# Patient Record
Sex: Male | Born: 1950
Health system: Southern US, Community
[De-identification: ages and names within clinical notes are randomized; demographics above are authoritative.]

## PROBLEM LIST (undated history)

## (undated) DIAGNOSIS — M1990 Unspecified osteoarthritis, unspecified site: Secondary | ICD-10-CM

## (undated) DIAGNOSIS — E785 Hyperlipidemia, unspecified: Secondary | ICD-10-CM

## (undated) DIAGNOSIS — G4700 Insomnia, unspecified: Secondary | ICD-10-CM

## (undated) DIAGNOSIS — R51 Headache: Secondary | ICD-10-CM

## (undated) DIAGNOSIS — K210 Gastro-esophageal reflux disease with esophagitis: Secondary | ICD-10-CM

## (undated) DIAGNOSIS — M2550 Pain in unspecified joint: Secondary | ICD-10-CM

## (undated) DIAGNOSIS — R112 Nausea with vomiting, unspecified: Secondary | ICD-10-CM

## (undated) DIAGNOSIS — IMO0001 Reserved for inherently not codable concepts without codable children: Secondary | ICD-10-CM

## (undated) DIAGNOSIS — R351 Nocturia: Secondary | ICD-10-CM

## (undated) DIAGNOSIS — K257 Chronic gastric ulcer without hemorrhage or perforation: Secondary | ICD-10-CM

## (undated) DIAGNOSIS — R519 Headache, unspecified: Secondary | ICD-10-CM

## (undated) DIAGNOSIS — R200 Anesthesia of skin: Secondary | ICD-10-CM

## (undated) DIAGNOSIS — G8929 Other chronic pain: Secondary | ICD-10-CM

## (undated) DIAGNOSIS — I10 Essential (primary) hypertension: Secondary | ICD-10-CM

## (undated) DIAGNOSIS — K922 Gastrointestinal hemorrhage, unspecified: Secondary | ICD-10-CM

## (undated) DIAGNOSIS — R3915 Urgency of urination: Secondary | ICD-10-CM

## (undated) DIAGNOSIS — Z8739 Personal history of other diseases of the musculoskeletal system and connective tissue: Secondary | ICD-10-CM

## (undated) DIAGNOSIS — Z9289 Personal history of other medical treatment: Secondary | ICD-10-CM

## (undated) DIAGNOSIS — Z9889 Other specified postprocedural states: Secondary | ICD-10-CM

## (undated) DIAGNOSIS — B1920 Unspecified viral hepatitis C without hepatic coma: Secondary | ICD-10-CM

## (undated) DIAGNOSIS — H409 Unspecified glaucoma: Secondary | ICD-10-CM

## (undated) DIAGNOSIS — R350 Frequency of micturition: Secondary | ICD-10-CM

## (undated) DIAGNOSIS — M549 Dorsalgia, unspecified: Secondary | ICD-10-CM

## (undated) DIAGNOSIS — K559 Vascular disorder of intestine, unspecified: Secondary | ICD-10-CM

## (undated) DIAGNOSIS — K449 Diaphragmatic hernia without obstruction or gangrene: Secondary | ICD-10-CM

## (undated) DIAGNOSIS — K219 Gastro-esophageal reflux disease without esophagitis: Secondary | ICD-10-CM

## (undated) DIAGNOSIS — I639 Cerebral infarction, unspecified: Secondary | ICD-10-CM

## (undated) DIAGNOSIS — C61 Malignant neoplasm of prostate: Secondary | ICD-10-CM

## (undated) HISTORY — PX: ESOPHAGOGASTRODUODENOSCOPY: SHX1529

## (undated) HISTORY — DX: Unspecified viral hepatitis C without hepatic coma: B19.20

## (undated) HISTORY — PX: PROSTATECTOMY: SHX69

## (undated) HISTORY — PX: SMALL INTESTINE SURGERY: SHX150

## (undated) HISTORY — PX: COLONOSCOPY: SHX174

## (undated) HISTORY — DX: Malignant neoplasm of prostate: C61

## (undated) HISTORY — PX: APPENDECTOMY: SHX54

## (undated) HISTORY — DX: Cerebral infarction, unspecified: I63.9

## (undated) HISTORY — PX: OTHER SURGICAL HISTORY: SHX169

## (undated) HISTORY — DX: Hyperlipidemia, unspecified: E78.5

## (undated) NOTE — *Deleted (*Deleted)
[facility] CONSULT NOTE  Patient Care Team: Deeann Saint, MD as PCP - General (Family Medicine)  CHIEF COMPLAINTS/PURPOSE OF CONSULTATION:  Newly diagnosed breast cancer  HISTORY OF PRESENTING ILLNESS:  [name] 5 y.o. male is here because of recent diagnosis of iron deficiency anemia. He presented to the ED on 03/19/20 for dizziness and hypotension, and was found to have Hg of 6. He was given 2 units of PRBCs, and received Feraheme, as labs showed iron saturation 13%, ferritin 2. He presents to the clinic today for initial evaluation.   I reviewed his records extensively and collaborated the history with the patient.  MEDICAL HISTORY:  Past Medical History:  Diagnosis Date  . Arthritis   . Cameron ulcer, chronic 01/11/2018  . Chronic back pain   . GERD (gastroesophageal reflux disease)    uses Baking Soda  . GIB (gastrointestinal bleeding) 2012  . Glaucoma    right eye  . Headache    occasionally  . Hepatitis C   . Hiatal hernia with gastroesophageal reflux disease and esophagitis 01/05/2018  . History of blood transfusion    no abnormal reaction noted  . History of gout    doesn't take any meds  . Hyperlipidemia    not on any meds  . Hypertension    takes Amlodipine and Lisinopril daily  . Insomnia    takes Ambien nightly  . Ischemic colitis (HCC) 2012  . Joint pain   . Nocturia   . Numbness    both legs occasionally  . PONV (postoperative nausea and vomiting)   . Prostate cancer (HCC)   . Shortness of breath dyspnea    rarely but when notices he can be lying/sitting/exertion.Dr.Hochrein is aware per pt  . Stroke (HCC) 2016   . Urinary frequency   . Urinary urgency     SURGICAL HISTORY: Past Surgical History:  Procedure Laterality Date  . APPENDECTOMY    . BIOPSY  01/11/2018   Procedure: BIOPSY;  Surgeon: Iva Boop, MD;  Location: [facility] ENDOSCOPY;  Service: Endoscopy;;  . COLONOSCOPY    . COLONOSCOPY N/A 10/26/2015    Procedure: COLONOSCOPY;  Surgeon: Sherrilyn Rist, MD;  Location[facility];  Service: Endoscopy;  Laterality: N/A;  . ESOPHAGOGASTRODUODENOSCOPY    . ESOPHAGOGASTRODUODENOSCOPY N/A 10/26/2015   Procedure: ESOPHAGOGASTRODUODENOSCOPY (EGD);  Surgeon: Sherrilyn Rist, MD;  Location: [facility] ENDOSCOPY;  Service: Endoscopy;  Laterality: N/A;  . ESOPHAGOGASTRODUODENOSCOPY (EGD) WITH PROPOFOL N/A 01/11/2018   Procedure: ESOPHAGOGASTRODUODENOSCOPY (EGD) WITH PROPOFOL;  Surgeon: Iva Boop, MD;  Location: [facility] ENDOSCOPY;  Service: Endoscopy;  Laterality: N/A;  . INGUINAL HERNIA REPAIR Right 11/04/2014   Procedure: RIGHT INGUINAL HERNIA REPAIR WITH MESH;  Surgeon: Avel Peace, MD;  Location: [facility] OR;  Service: General;  Laterality: Right;  . MASS EXCISION N/A 11/04/2014   Procedure: REMOVAL OF RIGHT GROIN SOFT TISSUE MASS;  Surgeon: Avel Peace, MD;  Location: [facility] OR;  Service: General;  Laterality: N/A;  . Multiple abdominal surgeries     total of 13  . PROSTATECTOMY    . SMALL INTESTINE SURGERY      SOCIAL HISTORY: Social History   Socioeconomic History  . Marital status: Legally Separated    Spouse name: Not on file  . Number of children: 2  . Years of education: Not on file  . Highest education level: Not on file  Occupational History  . Occupation: retired  Tobacco Use  . Smoking status: Current Every  Day Smoker    Packs/day: 0.50    Years: 50.00    Pack years: 25.00    Types: Cigarettes  . Smokeless tobacco: Never Used  Vaping Use  . Vaping Use: Former  Substance and Sexual Activity  . Alcohol use: Yes    Alcohol/week: 0.0 standard drinks    Comment: occasionally  . Drug use: Yes    Frequency: 5.0 times per week    Types: Marijuana    Comment: "as often as I can get it"  . Sexual activity: Yes  Other Topics Concern  . Not on file  Social History Narrative   One living child .  One murdered.  Lives with girlfriend.     Social Determinants of Health   Financial  Resource Strain:   . Difficulty of Paying Living Expenses: Not on file  Food Insecurity:   . Worried About Programme researcher, broadcasting/film/video in the Last Year: Not on file  . Ran Out of Food in the Last Year: Not on file  Transportation Needs:   . Lack of Transportation (Medical): Not on file  . Lack of Transportation (Non-Medical): Not on file  Physical Activity:   . Days of Exercise per Week: Not on file  . Minutes of Exercise per Session: Not on file  Stress:   . Feeling of Stress : Not on file  Social Connections:   . Frequency of Communication with Friends and Family: Not on file  . Frequency of Social Gatherings with Friends and Family: Not on file  . Attends Religious Services: Not on file  . Active Member of Clubs or Organizations: Not on file  . Attends Banker Meetings: Not on file  . Marital Status: Not on file  Intimate Partner Violence:   . Fear of Current or Ex-Partner: Not on file  . Emotionally Abused: Not on file  . Physically Abused: Not on file  . Sexually Abused: Not on file    FAMILY HISTORY: Family History  Problem Relation Age of Onset  . Alzheimer's disease Father   . Cancer Mother        Lymph node  . Heart failure Brother 45       Transplant 13 years ago  . CAD Brother   . Heart disease Sister 87       MI    ALLERGIES:  is allergic to penicillins and sudafed [pseudoephedrine].  MEDICATIONS:  Current Outpatient Medications  Medication Sig Dispense Refill  . acetaminophen (TYLENOL) 325 MG tablet Take 2 tablets (650 mg total) by mouth 4 (four) times daily -  with meals and at bedtime. (Patient taking differently: Take 650 mg by mouth every 6 (six) hours as needed for mild pain. )    . ALPRAZolam (XANAX) 0.25 MG tablet Take 1 tablet (0.25 mg total) by mouth daily as needed for anxiety. 30 tablet 0  . Blood Pressure Monitoring (BLOOD PRESSURE MONITOR/L CUFF) MISC Use as directed daily. 1 each 0  . buPROPion (WELLBUTRIN SR) 200 MG 12 hr tablet TAKE 1  TABLET BY MOUTH DAILY 30 tablet 2  . cyclobenzaprine (FLEXERIL) 5 MG tablet TAKE 1 TABLET BY MOUTH TWICE A DAY AS NEEDED FOR MUSCLE SPASMS 60 tablet 2  . ferrous sulfate 325 (65 FE) MG tablet TAKE 1 TABLET BY MOUTH THREE TWICE A DAY WITH MEALS (Patient taking differently: Take 325 mg by mouth 3 (three) times daily with meals. ) 90 tablet 3  . hydrALAZINE (APRESOLINE) 25 MG tablet TAKE 1  TABLET BY MOUTH THREE TIMES A DAY (Patient taking differently: Take 25 mg by mouth 3 (three) times daily. ) 90 tablet 2  . pantoprazole (PROTONIX) 40 MG tablet TAKE 1 TABLET BY MOUTH TWICE A DAY BEFORE A MEAL (Patient taking differently: Take 40 mg by mouth 2 (two) times daily. ) 60 tablet 0  . QUEtiapine (SEROQUEL) 200 MG tablet TAKE 1 TABLET BY MOUTH EVERY NIGHT AT BEDTIME 30 tablet 2  . senna-docusate (SENOKOT-S) 8.6-50 MG tablet Take 1 tablet by mouth 2 (two) times daily. 30 tablet 0  . sodium chloride (OCEAN) 0.65 % SOLN nasal spray Place 1 spray into both nostrils as needed for congestion.  0  . traMADol (ULTRAM) 50 MG tablet Take 1 tablet (50 mg total) by mouth every 12 (twelve) hours as needed for severe pain (shouder pain). 60 tablet 0   No current facility-administered medications for this visit.    REVIEW OF SYSTEMS:   Constitutional: Denies fevers, chills or abnormal night sweats Eyes: Denies blurriness of vision, double vision or watery eyes Ears, nose, mouth, throat, and face: Denies mucositis or sore throat Respiratory: Denies cough, dyspnea or wheezes Cardiovascular: Denies palpitation, chest discomfort or lower extremity swelling Gastrointestinal:  Denies nausea, heartburn or change in bowel habits Skin: Denies abnormal skin rashes Lymphatics: Denies new lymphadenopathy or easy bruising Neurological:Denies numbness, tingling or new weaknesses Behavioral/Psych: Mood is stable, no new changes  All other systems were reviewed with the patient and are negative.  PHYSICAL EXAMINATION: ECOG  PERFORMANCE STATUS: {CHL ONC ECOG PS:[id number]}  There were no vitals filed for this visit. There were no vitals filed for this visit.  GENERAL:alert, no distress and comfortable SKIN: skin color, texture, turgor are normal, no rashes or significant lesions EYES: normal, conjunctiva are pink and non-injected, sclera clear OROPHARYNX:no exudate, no erythema and lips, buccal mucosa, and tongue normal  NECK: supple, thyroid normal size, non-tender, without nodularity LYMPH:  no palpable lymphadenopathy in the cervical, axillary or inguinal LUNGS: clear to auscultation and percussion with normal breathing effort HEART: regular rate & rhythm and no murmurs and no lower extremity edema ABDOMEN:abdomen soft, non-tender and normal bowel sounds Musculoskeletal:no cyanosis of digits and no clubbing  PSYCH: alert & oriented x 3 with fluent speech NEURO: no focal motor/sensory deficits  LABORATORY DATA:  I have reviewed the data as listed Lab Results  Component Value Date   WBC 6.8 03/31/2020   HGB 9.7 (L) 03/31/2020   HCT 32.0 (L) 03/31/2020   MCV 78.6 (L) 03/31/2020   PLT 363 03/31/2020   Lab Results  Component Value Date   NA 138 03/31/2020   K 3.9 03/31/2020   CL 105 03/31/2020   CO2 25 03/31/2020    RADIOGRAPHIC STUDIES: I have personally reviewed the radiological reports and agreed with the findings in the report.  ASSESSMENT AND PLAN:  No problem-specific Assessment & Plan notes found for this encounter.   All questions were answered. The patient knows to call the clinic with any problems, questions or concerns.   Sabas Sous, MD, MPH 04/14/2020    I, Molly Dorshimer, am acting as scribe for Serena Croissant, MD.  {Add scribe attestation statement}

---

## 2000-03-27 ENCOUNTER — Emergency Department (HOSPITAL_COMMUNITY): Admission: EM | Admit: 2000-03-27 | Discharge: 2000-03-27 | Payer: Self-pay | Admitting: Emergency Medicine

## 2000-04-06 ENCOUNTER — Encounter: Payer: Self-pay | Admitting: Emergency Medicine

## 2000-04-06 ENCOUNTER — Encounter (INDEPENDENT_AMBULATORY_CARE_PROVIDER_SITE_OTHER): Payer: Self-pay | Admitting: Specialist

## 2000-04-06 ENCOUNTER — Emergency Department (HOSPITAL_COMMUNITY): Admission: EM | Admit: 2000-04-06 | Discharge: 2000-04-06 | Payer: Self-pay

## 2000-04-06 ENCOUNTER — Inpatient Hospital Stay: Admission: EM | Admit: 2000-04-06 | Discharge: 2000-04-19 | Payer: Self-pay | Admitting: Emergency Medicine

## 2000-04-07 ENCOUNTER — Encounter: Payer: Self-pay | Admitting: Internal Medicine

## 2000-04-11 ENCOUNTER — Encounter: Payer: Self-pay | Admitting: Internal Medicine

## 2000-04-14 ENCOUNTER — Encounter: Payer: Self-pay | Admitting: Internal Medicine

## 2000-04-23 ENCOUNTER — Inpatient Hospital Stay (HOSPITAL_COMMUNITY): Admission: EM | Admit: 2000-04-23 | Discharge: 2000-04-28 | Payer: Self-pay | Admitting: Emergency Medicine

## 2000-04-23 ENCOUNTER — Encounter: Payer: Self-pay | Admitting: Emergency Medicine

## 2000-04-24 ENCOUNTER — Encounter: Payer: Self-pay | Admitting: General Surgery

## 2000-04-25 ENCOUNTER — Encounter: Payer: Self-pay | Admitting: General Surgery

## 2001-01-20 ENCOUNTER — Emergency Department (HOSPITAL_COMMUNITY): Admission: EM | Admit: 2001-01-20 | Discharge: 2001-01-20 | Payer: Self-pay | Admitting: Emergency Medicine

## 2001-01-20 ENCOUNTER — Encounter: Payer: Self-pay | Admitting: Emergency Medicine

## 2001-10-29 ENCOUNTER — Emergency Department (HOSPITAL_COMMUNITY): Admission: EM | Admit: 2001-10-29 | Discharge: 2001-10-29 | Payer: Self-pay | Admitting: Emergency Medicine

## 2001-11-29 ENCOUNTER — Emergency Department (HOSPITAL_COMMUNITY): Admission: EM | Admit: 2001-11-29 | Discharge: 2001-11-29 | Payer: Self-pay | Admitting: Emergency Medicine

## 2001-11-29 ENCOUNTER — Encounter: Payer: Self-pay | Admitting: Emergency Medicine

## 2004-02-23 ENCOUNTER — Emergency Department (HOSPITAL_COMMUNITY): Admission: EM | Admit: 2004-02-23 | Discharge: 2004-02-23 | Payer: Self-pay | Admitting: Family Medicine

## 2005-10-07 ENCOUNTER — Emergency Department (HOSPITAL_COMMUNITY): Admission: EM | Admit: 2005-10-07 | Discharge: 2005-10-07 | Payer: Self-pay | Admitting: Emergency Medicine

## 2006-05-10 ENCOUNTER — Ambulatory Visit: Payer: Self-pay | Admitting: Family Medicine

## 2006-05-12 ENCOUNTER — Inpatient Hospital Stay (HOSPITAL_COMMUNITY): Admission: EM | Admit: 2006-05-12 | Discharge: 2006-05-17 | Payer: Self-pay | Admitting: Emergency Medicine

## 2006-05-12 ENCOUNTER — Ambulatory Visit: Payer: Self-pay | Admitting: Family Medicine

## 2006-05-13 ENCOUNTER — Encounter: Payer: Self-pay | Admitting: Internal Medicine

## 2006-05-13 ENCOUNTER — Ambulatory Visit: Payer: Self-pay | Admitting: Internal Medicine

## 2006-05-13 ENCOUNTER — Ambulatory Visit: Payer: Self-pay | Admitting: *Deleted

## 2006-05-16 ENCOUNTER — Encounter (INDEPENDENT_AMBULATORY_CARE_PROVIDER_SITE_OTHER): Payer: Self-pay | Admitting: Specialist

## 2006-05-26 ENCOUNTER — Ambulatory Visit (HOSPITAL_COMMUNITY): Admission: RE | Admit: 2006-05-26 | Discharge: 2006-05-26 | Payer: Self-pay | Admitting: Gastroenterology

## 2006-06-14 ENCOUNTER — Ambulatory Visit: Payer: Self-pay | Admitting: Family Medicine

## 2006-06-29 ENCOUNTER — Ambulatory Visit: Payer: Self-pay | Admitting: Family Medicine

## 2006-07-01 ENCOUNTER — Ambulatory Visit (HOSPITAL_COMMUNITY): Admission: RE | Admit: 2006-07-01 | Discharge: 2006-07-01 | Payer: Self-pay | Admitting: Family Medicine

## 2006-07-18 ENCOUNTER — Ambulatory Visit: Payer: Self-pay | Admitting: Family Medicine

## 2006-09-16 ENCOUNTER — Ambulatory Visit: Payer: Self-pay | Admitting: Family Medicine

## 2006-12-26 ENCOUNTER — Ambulatory Visit: Payer: Self-pay | Admitting: Family Medicine

## 2007-01-06 ENCOUNTER — Ambulatory Visit: Payer: Self-pay | Admitting: Family Medicine

## 2007-04-05 ENCOUNTER — Encounter (INDEPENDENT_AMBULATORY_CARE_PROVIDER_SITE_OTHER): Payer: Self-pay | Admitting: *Deleted

## 2007-11-24 ENCOUNTER — Ambulatory Visit: Payer: Self-pay | Admitting: Internal Medicine

## 2007-11-24 ENCOUNTER — Encounter (INDEPENDENT_AMBULATORY_CARE_PROVIDER_SITE_OTHER): Payer: Self-pay | Admitting: Family Medicine

## 2007-11-24 LAB — CONVERTED CEMR LAB
BUN: 11 mg/dL (ref 6–23)
CO2: 24 meq/L (ref 19–32)
Calcium: 9.9 mg/dL (ref 8.4–10.5)
Chloride: 106 meq/L (ref 96–112)
Cholesterol: 93 mg/dL (ref 0–200)
Creatinine, Ser: 0.83 mg/dL (ref 0.40–1.50)
Free T4: 1.17 ng/dL (ref 0.89–1.80)
Glucose, Bld: 87 mg/dL (ref 70–99)
HDL: 39 mg/dL — ABNORMAL LOW (ref >39)
LDL Cholesterol: 30 mg/dL (ref 0–99)
Magnesium: 2.2 mg/dL (ref 1.5–2.5)
Potassium: 4.6 meq/L (ref 3.5–5.3)
Sodium: 142 meq/L (ref 135–145)
TSH: 0.784 microintl units/mL (ref 0.350–5.50)
Total CHOL/HDL Ratio: 2.4
Triglycerides: 122 mg/dL (ref <150)
Uric Acid, Serum: 6.5 mg/dL (ref 4.0–7.8)
VLDL: 24 mg/dL (ref 0–40)
Vit D, 1,25-Dihydroxy: 17 — ABNORMAL LOW (ref 30–89)

## 2007-12-14 ENCOUNTER — Ambulatory Visit: Payer: Self-pay | Admitting: Family Medicine

## 2008-10-28 ENCOUNTER — Inpatient Hospital Stay (HOSPITAL_COMMUNITY): Admission: EM | Admit: 2008-10-28 | Discharge: 2008-10-30 | Payer: Self-pay | Admitting: Emergency Medicine

## 2008-12-19 ENCOUNTER — Emergency Department (HOSPITAL_COMMUNITY): Admission: EM | Admit: 2008-12-19 | Discharge: 2008-12-19 | Payer: Self-pay | Admitting: Emergency Medicine

## 2009-07-24 ENCOUNTER — Emergency Department (HOSPITAL_COMMUNITY): Admission: EM | Admit: 2009-07-24 | Discharge: 2009-07-24 | Payer: Self-pay | Admitting: Emergency Medicine

## 2009-08-15 ENCOUNTER — Ambulatory Visit (HOSPITAL_COMMUNITY): Admission: RE | Admit: 2009-08-15 | Discharge: 2009-08-15 | Payer: Self-pay | Admitting: Urology

## 2009-09-12 ENCOUNTER — Ambulatory Visit (HOSPITAL_BASED_OUTPATIENT_CLINIC_OR_DEPARTMENT_OTHER): Admission: RE | Admit: 2009-09-12 | Discharge: 2009-09-13 | Payer: Self-pay | Admitting: Urology

## 2010-07-19 DIAGNOSIS — K559 Vascular disorder of intestine, unspecified: Secondary | ICD-10-CM

## 2010-07-19 DIAGNOSIS — K922 Gastrointestinal hemorrhage, unspecified: Secondary | ICD-10-CM

## 2010-07-19 HISTORY — DX: Gastrointestinal hemorrhage, unspecified: K92.2

## 2010-07-19 HISTORY — DX: Vascular disorder of intestine, unspecified: K55.9

## 2010-08-09 ENCOUNTER — Encounter: Payer: Self-pay | Admitting: Urology

## 2010-08-20 NOTE — Miscellaneous (Signed)
Summary: VIP  Patient: [name] Note: All result statuses are Final unless otherwise noted.  Tests: (1) VIP (Medications)   LLIMPORTMEDS              "Result Below..."       RESULT: XYZAL TABS 5 MG*TAKE 1 TABLET BY MOUTH EVERY EVENING FOR ITCHING*01/06/2007*Last Refill: [id number]*******   LLIMPORTMEDS              "Result Below..."       RESULT: TRAMADOL HCL TABS 50 MG*TAKE 1 TO 2 TABLETS EVERY 6 HOURS AS NEEDED FOR PAIN*09/16/2006*Last Refill: [id number]*[id number]*******   LLIMPORTMEDS              "Result Below..."       RESULT: PROTONIX TBEC 40 MG*TAKE ONE (1) TABLET BY MOUTH TWO (2) TIMES DAILY*12/26/2006*Last Refill: VFIEPPI*[id number]*******   LLIMPORTMEDS              "Result Below..."       RESULT: PREDNISONE TABS 5 MG*6 TABLETS ON DAY 1, 5 TABLETS ON DAY 2 4 TABS ON DAY 3,  3 TABS ON DAY 4, 2 TABLETS ON DAY 5, 1 TABLET ON DAY 6*01/06/2007*Last Refill: CZYSAYT*[id number]*******   LLIMPORTMEDS              "Result Below..."       RESULT: METRONIDAZOLE TABS 500 MG*TAKE ONE (1) TABLET TWICE DAILY*06/28/2006*Last Refill: UXNATFT*[id number]*******   LLIMPORTMEDS              "Result Below..."       RESULT: LISINOPRIL TABS 5 MG*TAKE ONE (1) TABLET EACH DAY*09/16/2006*Last Refill: Unknown*[id number]*******   LLIMPORTMEDS              "Result Below..."       RESULT: DOK CAPS 100 MG*TAKE ONE (1) CAPSULE BY MOUTH 2 TIMES DAILY FOR CONSTIPATION*05/10/2006*Last Refill: Unknown*[id number]*******   LLIMPORTMEDS              "Result Below..."       RESULT: COLCHICINE TABS 0.6 MG*TAKE ONE (1) TABLET TWICE DAILY FOR GOUT*01/03/2007*Last Refill: 03/15/2007*[id number]*******   LLIMPORTMEDS              "Result Below..."       RESULT: CLINDAMYCIN HCL CAPS 300 MG*TAKE 1 CAPSULE BY MOUTH THREE TIMES A DAY*09/16/2006*Last Refill: Unknown*[id number]*******   LLIMPORTMEDS              "Result Below..."       RESULT: CIPROFLOXACIN HCL TABS 500 MG*TAKE ONE (1) TABLET TWICE DAILY   Generic for CIPRO 500MG  TAB*01/06/2007*Last Refill:  URKYHCW*[id number]*******   LLIMPORTMEDS              "Result Below..."       RESULT: CARDURA TABS 4 MG*TAKE ONE (1) TABLET AT BEDTIME*12/26/2006*Last Refill: GBTDVVO*[id number]*******   LLIMPORTMEDS              "Result Below..."       RESULT: AMOXICILLIN CAPS 500 MG*TAKE ONE (1) CAPSULE TWICE DAILY*06/28/2006*Last Refill: Joke.[email or web address]*******   LLIMPORTALLS              "Result Below..."       RESULT: PENICILLINS*[id number]**   LLIMPORTALLS              "Result Below..."       RESULT: PSEUDOEPHEDRINE HCL ORAL (SUDAFED)*[id number]**  Note: An exclamation mark (!) indicates a result that was not dispersed into the flowsheet. Document Creation Date: 05/18/2007 3:05 PM _______________________________________________________________________  (1) Order result status: Final Collection or observation date-time:  04/05/2007 Requested date-time: 04/05/2007 Receipt date-time:  Reported date-time: 04/05/2007 Referring Physician:   Ordering Physician:   Specimen Source:  Source: [place] Order Number:  Lab site:

## 2010-09-07 ENCOUNTER — Emergency Department (HOSPITAL_COMMUNITY)
Admission: EM | Admit: 2010-09-07 | Discharge: 2010-09-08 | Disposition: A | Discharge status: Home / Self Care | Payer: Medicare Other | Attending: Emergency Medicine | Admitting: Emergency Medicine

## 2010-09-07 DIAGNOSIS — I10 Essential (primary) hypertension: Secondary | ICD-10-CM | POA: Insufficient documentation

## 2010-09-07 DIAGNOSIS — T24019A Burn of unspecified degree of unspecified thigh, initial encounter: Secondary | ICD-10-CM | POA: Insufficient documentation

## 2010-09-07 DIAGNOSIS — X12XXXA Contact with other hot fluids, initial encounter: Secondary | ICD-10-CM | POA: Insufficient documentation

## 2010-09-07 DIAGNOSIS — T23009A Burn of unspecified degree of unspecified hand, unspecified site, initial encounter: Secondary | ICD-10-CM | POA: Insufficient documentation

## 2010-09-07 DIAGNOSIS — T24029A Burn of unspecified degree of unspecified knee, initial encounter: Secondary | ICD-10-CM | POA: Insufficient documentation

## 2010-09-11 ENCOUNTER — Emergency Department (HOSPITAL_COMMUNITY)
Admission: EM | Admit: 2010-09-11 | Discharge: 2010-09-11 | Disposition: A | Discharge status: Home / Self Care | Payer: Medicare Other | Attending: Emergency Medicine | Admitting: Emergency Medicine

## 2010-09-11 DIAGNOSIS — T24239A Burn of second degree of unspecified lower leg, initial encounter: Secondary | ICD-10-CM | POA: Insufficient documentation

## 2010-09-11 DIAGNOSIS — Y93G3 Activity, cooking and baking: Secondary | ICD-10-CM | POA: Insufficient documentation

## 2010-09-11 DIAGNOSIS — X12XXXA Contact with other hot fluids, initial encounter: Secondary | ICD-10-CM | POA: Insufficient documentation

## 2010-09-11 DIAGNOSIS — Z9119 Patient's noncompliance with other medical treatment and regimen: Secondary | ICD-10-CM | POA: Insufficient documentation

## 2010-09-11 DIAGNOSIS — T23209A Burn of second degree of unspecified hand, unspecified site, initial encounter: Secondary | ICD-10-CM | POA: Insufficient documentation

## 2010-09-11 DIAGNOSIS — T24219A Burn of second degree of unspecified thigh, initial encounter: Secondary | ICD-10-CM | POA: Insufficient documentation

## 2010-09-11 DIAGNOSIS — Z91199 Patient's noncompliance with other medical treatment and regimen due to unspecified reason: Secondary | ICD-10-CM | POA: Insufficient documentation

## 2010-09-11 DIAGNOSIS — N400 Enlarged prostate without lower urinary tract symptoms: Secondary | ICD-10-CM | POA: Insufficient documentation

## 2010-09-11 DIAGNOSIS — M109 Gout, unspecified: Secondary | ICD-10-CM | POA: Insufficient documentation

## 2010-09-11 DIAGNOSIS — I10 Essential (primary) hypertension: Secondary | ICD-10-CM | POA: Insufficient documentation

## 2010-10-03 LAB — URINE MICROSCOPIC-ADD ON

## 2010-10-03 LAB — URINE CULTURE
Colony Count: NO GROWTH
Culture: NO GROWTH

## 2010-10-03 LAB — URINALYSIS, ROUTINE W REFLEX MICROSCOPIC
Bilirubin Urine: NEGATIVE
Glucose, UA: NEGATIVE mg/dL
Ketones, ur: NEGATIVE mg/dL
Leukocytes, UA: NEGATIVE
Nitrite: NEGATIVE
Protein, ur: NEGATIVE mg/dL
Specific Gravity, Urine: 1.026 (ref 1.005–1.030)
Urobilinogen, UA: 0.2 mg/dL (ref 0.0–1.0)
pH: 5.5 (ref 5.0–8.0)

## 2010-10-04 LAB — URINALYSIS, ROUTINE W REFLEX MICROSCOPIC
Bilirubin Urine: NEGATIVE
Glucose, UA: NEGATIVE mg/dL
Hgb urine dipstick: NEGATIVE
Ketones, ur: NEGATIVE mg/dL
Nitrite: NEGATIVE
Protein, ur: NEGATIVE mg/dL
Specific Gravity, Urine: 1.013 (ref 1.005–1.030)
Urobilinogen, UA: 1 mg/dL (ref 0.0–1.0)
pH: 6 (ref 5.0–8.0)

## 2010-10-04 LAB — URINE CULTURE
Colony Count: 3000
Special Requests: NEGATIVE

## 2010-10-04 LAB — COMPREHENSIVE METABOLIC PANEL
ALT: 27 U/L (ref 0–53)
AST: 31 U/L (ref 0–37)
Albumin: 3.8 g/dL (ref 3.5–5.2)
Alkaline Phosphatase: 105 U/L (ref 39–117)
BUN: 12 mg/dL (ref 6–23)
CO2: 23 mEq/L (ref 19–32)
Calcium: 9.1 mg/dL (ref 8.4–10.5)
Chloride: 108 mEq/L (ref 96–112)
Creatinine, Ser: 0.85 mg/dL (ref 0.4–1.5)
GFR calc Af Amer: >60 mL/min (ref >60)
GFR calc non Af Amer: >60 mL/min (ref >60)
Glucose, Bld: 90 mg/dL (ref 70–99)
Potassium: 4.2 mEq/L (ref 3.5–5.1)
Sodium: 139 mEq/L (ref 135–145)
Total Bilirubin: 0.6 mg/dL (ref 0.3–1.2)
Total Protein: 7.4 g/dL (ref 6.0–8.3)

## 2010-10-04 LAB — PROTIME-INR
INR: 1.26 (ref 0.00–1.49)
Prothrombin Time: 15.7 seconds — ABNORMAL HIGH (ref 11.6–15.2)

## 2010-10-04 LAB — CBC
HCT: 39.4 % (ref 39.0–52.0)
Hemoglobin: 12.4 g/dL — ABNORMAL LOW (ref 13.0–17.0)
MCHC: 31.5 g/dL (ref 30.0–36.0)
MCV: 81.4 fL (ref 78.0–100.0)
Platelets: 313 10*3/uL (ref 150–400)
RBC: 4.84 MIL/uL (ref 4.22–5.81)
RDW: 18.4 % — ABNORMAL HIGH (ref 11.5–15.5)
WBC: 4.7 10*3/uL (ref 4.0–10.5)

## 2010-10-04 LAB — APTT: aPTT: 35 seconds (ref 24–37)

## 2010-10-07 LAB — POCT I-STAT 4, (NA,K, GLUC, HGB,HCT)
Glucose, Bld: 103 mg/dL — ABNORMAL HIGH (ref 70–99)
HCT: 38 % — ABNORMAL LOW (ref 39.0–52.0)
Hemoglobin: 12.9 g/dL — ABNORMAL LOW (ref 13.0–17.0)
Potassium: 4.1 mEq/L (ref 3.5–5.1)
Sodium: 143 mEq/L (ref 135–145)

## 2010-10-26 LAB — DIFFERENTIAL
Basophils Absolute: 0.1 10*3/uL (ref 0.0–0.1)
Basophils Relative: 1 % (ref 0–1)
Eosinophils Absolute: 0.1 10*3/uL (ref 0.0–0.7)
Eosinophils Relative: 1 % (ref 0–5)
Lymphocytes Relative: 21 % (ref 12–46)
Lymphs Abs: 1.6 10*3/uL (ref 0.7–4.0)
Monocytes Absolute: 0.8 10*3/uL (ref 0.1–1.0)
Monocytes Relative: 10 % (ref 3–12)
Neutro Abs: 5.2 10*3/uL (ref 1.7–7.7)
Neutrophils Relative %: 67 % (ref 43–77)

## 2010-10-26 LAB — PROTIME-INR
INR: 1.4 (ref 0.00–1.49)
Prothrombin Time: 17.7 seconds — ABNORMAL HIGH (ref 11.6–15.2)

## 2010-10-26 LAB — CBC
HCT: 32.1 % — ABNORMAL LOW (ref 39.0–52.0)
Hemoglobin: 9.4 g/dL — ABNORMAL LOW (ref 13.0–17.0)
MCHC: 29.3 g/dL — ABNORMAL LOW (ref 30.0–36.0)
MCV: 65.9 fL — ABNORMAL LOW (ref 78.0–100.0)
Platelets: 315 10*3/uL (ref 150–400)
RBC: 4.87 MIL/uL (ref 4.22–5.81)
RDW: 19.8 % — ABNORMAL HIGH (ref 11.5–15.5)
WBC: 7.8 10*3/uL (ref 4.0–10.5)

## 2010-10-26 LAB — COMPREHENSIVE METABOLIC PANEL
ALT: 21 U/L (ref 0–53)
AST: 23 U/L (ref 0–37)
Albumin: 4.2 g/dL (ref 3.5–5.2)
Alkaline Phosphatase: 118 U/L — ABNORMAL HIGH (ref 39–117)
BUN: 10 mg/dL (ref 6–23)
CO2: 26 mEq/L (ref 19–32)
Calcium: 9.1 mg/dL (ref 8.4–10.5)
Chloride: 109 mEq/L (ref 96–112)
Creatinine, Ser: 0.84 mg/dL (ref 0.4–1.5)
GFR calc Af Amer: >60 mL/min (ref >60)
GFR calc non Af Amer: >60 mL/min (ref >60)
Glucose, Bld: 87 mg/dL (ref 70–99)
Potassium: 4 mEq/L (ref 3.5–5.1)
Sodium: 140 mEq/L (ref 135–145)
Total Bilirubin: 0.7 mg/dL (ref 0.3–1.2)
Total Protein: 7.3 g/dL (ref 6.0–8.3)

## 2010-10-26 LAB — SEDIMENTATION RATE: Sed Rate: 4 mm/hr (ref 0–16)

## 2010-10-28 LAB — CLOSTRIDIUM DIFFICILE EIA: C difficile Toxins A+B, EIA: NEGATIVE

## 2010-10-28 LAB — CBC
HCT: 30.6 % — ABNORMAL LOW (ref 39.0–52.0)
HCT: 32.7 % — ABNORMAL LOW (ref 39.0–52.0)
HCT: 33 % — ABNORMAL LOW (ref 39.0–52.0)
HCT: 33.6 % — ABNORMAL LOW (ref 39.0–52.0)
Hemoglobin: 10.1 g/dL — ABNORMAL LOW (ref 13.0–17.0)
Hemoglobin: 9.6 g/dL — ABNORMAL LOW (ref 13.0–17.0)
Hemoglobin: 9.6 g/dL — ABNORMAL LOW (ref 13.0–17.0)
Hemoglobin: 9.9 g/dL — ABNORMAL LOW (ref 13.0–17.0)
MCHC: 29.5 g/dL — ABNORMAL LOW (ref 30.0–36.0)
MCHC: 29.9 g/dL — ABNORMAL LOW (ref 30.0–36.0)
MCHC: 30.1 g/dL (ref 30.0–36.0)
MCHC: 31.4 g/dL (ref 30.0–36.0)
MCV: 63.3 fL — ABNORMAL LOW (ref 78.0–100.0)
MCV: 63.4 fL — ABNORMAL LOW (ref 78.0–100.0)
MCV: 63.5 fL — ABNORMAL LOW (ref 78.0–100.0)
MCV: 64.6 fL — ABNORMAL LOW (ref 78.0–100.0)
Platelets: 310 10*3/uL (ref 150–400)
Platelets: 314 10*3/uL (ref 150–400)
Platelets: 323 10*3/uL (ref 150–400)
Platelets: 339 10*3/uL (ref 150–400)
RBC: 4.83 MIL/uL (ref 4.22–5.81)
RBC: 5.07 MIL/uL (ref 4.22–5.81)
RBC: 5.19 MIL/uL (ref 4.22–5.81)
RBC: 5.31 MIL/uL (ref 4.22–5.81)
RDW: 19.5 % — ABNORMAL HIGH (ref 11.5–15.5)
RDW: 20.7 % — ABNORMAL HIGH (ref 11.5–15.5)
RDW: 20.9 % — ABNORMAL HIGH (ref 11.5–15.5)
RDW: 20.9 % — ABNORMAL HIGH (ref 11.5–15.5)
WBC: 4.4 10*3/uL (ref 4.0–10.5)
WBC: 5.4 10*3/uL (ref 4.0–10.5)
WBC: 6.7 10*3/uL (ref 4.0–10.5)
WBC: 6.9 10*3/uL (ref 4.0–10.5)

## 2010-10-28 LAB — BASIC METABOLIC PANEL
BUN: 10 mg/dL (ref 6–23)
BUN: 5 mg/dL — ABNORMAL LOW (ref 6–23)
BUN: 5 mg/dL — ABNORMAL LOW (ref 6–23)
CO2: 25 mEq/L (ref 19–32)
CO2: 26 mEq/L (ref 19–32)
CO2: 28 mEq/L (ref 19–32)
Calcium: 8.9 mg/dL (ref 8.4–10.5)
Calcium: 9.3 mg/dL (ref 8.4–10.5)
Calcium: 9.3 mg/dL (ref 8.4–10.5)
Chloride: 102 mEq/L (ref 96–112)
Chloride: 103 mEq/L (ref 96–112)
Chloride: 106 mEq/L (ref 96–112)
Creatinine, Ser: 0.59 mg/dL (ref 0.4–1.5)
Creatinine, Ser: 0.84 mg/dL (ref 0.4–1.5)
Creatinine, Ser: 0.95 mg/dL (ref 0.4–1.5)
GFR calc Af Amer: >60 mL/min (ref >60)
GFR calc Af Amer: >60 mL/min (ref >60)
GFR calc Af Amer: >60 mL/min (ref >60)
GFR calc non Af Amer: >60 mL/min (ref >60)
GFR calc non Af Amer: >60 mL/min (ref >60)
GFR calc non Af Amer: >60 mL/min (ref >60)
Glucose, Bld: 104 mg/dL — ABNORMAL HIGH (ref 70–99)
Glucose, Bld: 114 mg/dL — ABNORMAL HIGH (ref 70–99)
Glucose, Bld: 143 mg/dL — ABNORMAL HIGH (ref 70–99)
Potassium: 3.3 mEq/L — ABNORMAL LOW (ref 3.5–5.1)
Potassium: 3.7 mEq/L (ref 3.5–5.1)
Potassium: 4.1 mEq/L (ref 3.5–5.1)
Sodium: 136 mEq/L (ref 135–145)
Sodium: 137 mEq/L (ref 135–145)
Sodium: 140 mEq/L (ref 135–145)

## 2010-10-28 LAB — IRON AND TIBC
Iron: 17 ug/dL — ABNORMAL LOW (ref 42–135)
Saturation Ratios: 3 % — ABNORMAL LOW (ref 20–55)
TIBC: 517 ug/dL — ABNORMAL HIGH (ref 215–435)
UIBC: 500 ug/dL

## 2010-10-28 LAB — DIFFERENTIAL
Basophils Absolute: 0.1 10*3/uL (ref 0.0–0.1)
Basophils Relative: 1 % (ref 0–1)
Eosinophils Absolute: 0.1 10*3/uL (ref 0.0–0.7)
Eosinophils Relative: 2 % (ref 0–5)
Lymphocytes Relative: 29 % (ref 12–46)
Lymphs Abs: 2 10*3/uL (ref 0.7–4.0)
Monocytes Absolute: 0.6 10*3/uL (ref 0.1–1.0)
Monocytes Relative: 9 % (ref 3–12)
Neutro Abs: 4.1 10*3/uL (ref 1.7–7.7)
Neutrophils Relative %: 59 % (ref 43–77)

## 2010-10-28 LAB — PROTIME-INR
INR: 1.3 (ref 0.00–1.49)
Prothrombin Time: 16.7 seconds — ABNORMAL HIGH (ref 11.6–15.2)

## 2010-10-28 LAB — CROSSMATCH
ABO/RH(D): O POS
Antibody Screen: NEGATIVE

## 2010-10-28 LAB — OVA AND PARASITE EXAMINATION

## 2010-10-28 LAB — STOOL CULTURE

## 2010-10-28 LAB — COMPREHENSIVE METABOLIC PANEL
ALT: 18 U/L (ref 0–53)
AST: 21 U/L (ref 0–37)
Albumin: 4.1 g/dL (ref 3.5–5.2)
Alkaline Phosphatase: 106 U/L (ref 39–117)
BUN: 7 mg/dL (ref 6–23)
CO2: 24 mEq/L (ref 19–32)
Calcium: 9.5 mg/dL (ref 8.4–10.5)
Chloride: 107 mEq/L (ref 96–112)
Creatinine, Ser: 0.72 mg/dL (ref 0.4–1.5)
GFR calc Af Amer: >60 mL/min (ref >60)
GFR calc non Af Amer: >60 mL/min (ref >60)
Glucose, Bld: 88 mg/dL (ref 70–99)
Potassium: 3.3 mEq/L — ABNORMAL LOW (ref 3.5–5.1)
Sodium: 140 mEq/L (ref 135–145)
Total Bilirubin: 0.6 mg/dL (ref 0.3–1.2)
Total Protein: 7.7 g/dL (ref 6.0–8.3)

## 2010-10-28 LAB — URINALYSIS, ROUTINE W REFLEX MICROSCOPIC
Bilirubin Urine: NEGATIVE
Glucose, UA: NEGATIVE mg/dL
Hgb urine dipstick: NEGATIVE
Ketones, ur: NEGATIVE mg/dL
Nitrite: NEGATIVE
Protein, ur: 100 mg/dL — AB
Specific Gravity, Urine: 1.026 (ref 1.005–1.030)
Urobilinogen, UA: 0.2 mg/dL (ref 0.0–1.0)
pH: 6 (ref 5.0–8.0)

## 2010-10-28 LAB — URINE MICROSCOPIC-ADD ON

## 2010-10-28 LAB — ABO/RH: ABO/RH(D): O POS

## 2010-10-28 LAB — FERRITIN: Ferritin: 2 ng/mL — ABNORMAL LOW (ref 22–322)

## 2010-10-28 LAB — SAMPLE TO BLOOD BANK

## 2010-10-28 LAB — HEMOGLOBIN AND HEMATOCRIT, BLOOD
HCT: 33.8 % — ABNORMAL LOW (ref 39.0–52.0)
Hemoglobin: 10.1 g/dL — ABNORMAL LOW (ref 13.0–17.0)

## 2010-10-28 LAB — BRAIN NATRIURETIC PEPTIDE
Pro B Natriuretic peptide (BNP): <30 pg/mL (ref 0.0–100.0)
Pro B Natriuretic peptide (BNP): <30 pg/mL (ref 0.0–100.0)

## 2010-12-01 NOTE — Consult Note (Signed)
[name], [name]              ACCOUNT NO.:  [id number]   MEDICAL RECORD NO.:  [id number]          PATIENT TYPE:  INP   LOCATION:  1408                         FACILITY:  [facility]   PHYSICIAN:  Anselmo Rod, M.D.  DATE OF BIRTH:  31-Mar-1951   DATE OF CONSULTATION:  10/28/2008  DATE OF DISCHARGE:                                 CONSULTATION   REASON FOR CONSULTATION:  Rectal bleeding with left lower quadrant pain.   ASSESSMENT:  1. Ischemic colitis by CT.  The patient has had left lower quadrant      pain with bright red bleeding per rectum and diarrhea for the last      4 to 5 days.  2. Gastroesophageal reflux disease, on proton pump inhibitors.  3. Nausea and vomiting of unclear etiology.  4. Severe iron-deficiency anemia with a ferritin of 2, iron binding      capacity of 517, and total iron of 17.  5. Benign prostatic hypertrophy.  6. Hypertension.  7. Gout.  8. Obesity.   RECOMMENDATIONS:  1. Change p.o. Protonix to IV.  2. Allow patient clear liquid diet for now.  3. Advance diet as tolerated.  4. The patient will need extensive GI workup including EGD, colon, and      a possible small bowel study for his severe iron-deficiency anemia      once acute episode of ischemic colitis resolves.  5. Discontinue Cipro and Flagyl.   PAST MEDICAL HISTORY:  See list above.   ALLERGIES:  PENICILLIN.   MEDICATIONS:  In the hospital are:  Ciprofloxacin, Hydralazine,  Hydrochlorothiazide, Hydromorphone, Metronidazole, Nicotine patch,  Protonix p.o., KCl, Albuterol, Ativan PRN, Maalox PRN and Zofran PRN   PAST SURGICAL HISTORY:  1. The patient has had surgery for a ruptured appendix in 1969.  2. 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  3. Small bowel obstruction resulting in surgery in 2001.   FAMILY HISTORY:  The patient's sister died of colon cancer in early 75's  and there is a history of cardiac disease.  Question  cardiomyopathy in  the patient's brother who had a transplant.  The patient's mother died  of lymphoma.  His father died of complications of Alzheimer's disease.   SOCIAL HISTORY:  He is on disability.  He denies IV drug use or alcohol  use.  He smokes 2 packs every 1 to 2 days over the last year.   REVIEW OF SYSTEMS:  1. Left lower quadrant pain.  2. Nausea and vomiting.  3. Rectal bleeding.  4. Diarrhea.   PHYSICAL EXAMINATION:  GENERAL:  Physical examination reveals a very  cooperative middle-aged African-American male in no acute distress.  VITAL SIGNS:  Temperature 98.9, blood pressure 167/93, pulse of 78 per  minute, and respiratory rate 19.  HEENT:  Examination reveals atraumatic and normocephalic head.  Facial  symmetry preserved.  Oropharyngeal mucosa without exudate.  NECK:  Supple.  CHEST:  Clear to auscultation.  S1 and S2  regular.  ABDOMEN:  Obese with some left lower quadrant tenderness  on palpation  with decreased bowel sounds.  No hepatosplenomegaly appreciated.  RECTAL:  Deferred.   LABORATORY DATA:  Laboratory evaluation reveals a ferritin of 2, iron of  17, hemoglobin 10.1 with an MCV of 63.3, platelets of 339,000, and white  count is 6700.  PT and INR 16.7 and 1.3 respectively.  Sodium of 137,  potassium 3.7, chloride 103, CO2 26, BUN 5, creatinine 0.5, and glucose  of 114.  C. diff toxin assay is negative.   PLAN:  As above.  Further recommendations to be made in followup.      Anselmo Rod, M.D.  Electronically Signed     JNM/MEDQ  D:  10/28/2008  T:  10/28/2008  Job:  [id number]

## 2010-12-01 NOTE — H&P (Signed)
NAME:  [name], [name] NO.:  [id number]   MEDICAL RECORD NO.:  [id number]          PATIENT TYPE:  EMS   LOCATION:  ED                           FACILITY:  [facility]   PHYSICIAN:  Theodosia Paling, MD    DATE OF BIRTH:  09-21-1950   DATE OF ADMISSION:  10/28/2008  DATE OF DISCHARGE:                              HISTORY & PHYSICAL   PRIMARY CARE PHYSICIAN:  The patient is admitted as unassigned, does not  have a primary care physician, is to follow-up with [facility] in the  past.   CHIEF COMPLAINT:  Abdominal pain and GI bleed.   HISTORY OF PRESENT ILLNESS:  [name] is a very pleasant 59 year old  Afro American gentleman with a past medical history of hypertension,  BPH, GERD and history of GI bleed in the past.  He started to experience  lower abdominal pain for the last few days on and off.  He did not have  any associated nausea or vomiting or hematemesis.  However, this morning  he noticed at least 5-6 episodes of bright red blood per rectum.  He  reported to the emergency room where he had one more bowel movement  which was noted to be olive green in color.  His last colonoscopy was  performed in 2007 by Dr. Loreta Ave which was very limited because of the  bacteria in he colon.  And had prominent internal hemorrhoids and small  cyst-size polyps biopsied from the rectum and rectosigmoid junction.  He  recommended for a repeat colonoscopy in the future which was never  performed.  [facility] hospitalist service was consulted for further  evaluation and management of the patient.  The patient was  hemodynamically stable and his hemoglobin was 9.6 and hematocrit 32.7 at  the time of admission.  He has a history of iron deficiency anemia in  the past.   REVIEW OF SYSTEMS:  Essentially negative except for what is mentioned  under HPI.   PAST MEDICAL HISTORY:  1. Hypertension.  2. Gout.  3. Benign prostatic hypertrophy.  4. GERD.   MEDICATIONS:  The patient does  not remember his home medications.  He  has brought the list.  This will be confirmed in the morning.  His  pharmacy is [facility] down the street which is closed at this time.   PAST SURGICAL HISTORY:  The patient has a history of several surgeries  and they are as follows.  1. Small bowel obstruction surgery in 2001.  2. Ruptured appendix in 1969.  3. 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   FAMILY HISTORY:  The patient's sister had colon cancer in early 75s and  history of heart transplant in the patient's brother.  Mother died of  lymphoma.  Father died of Alzheimer's disease.   SOCIAL HISTORY:  The patient smokes one pack in two days for last year.  He denies alcohol or IV drug abuse.   ALLERGIES:  The patient is allergic to penicillin which causes him to  break out and have fever, similar reaction with  Sudafed.   PHYSICAL EXAMINATION:  VITAL SIGNS:  Blood pressure 180 x 70.  Heart  rate 64, oxygen saturation 99% on 2 liters nasal cannula oxygen.  Afebrile.  GENERAL:  No acute distress.  HEENT:  Moist mucous membranes.  No ear or nose discharge.  EOM intact.  LUNGS:  Normal breath sounds.  No rhonchi or crepitations.  CARDIOVASCULAR:  S1, S2 normal.  No murmur or gallop heard.  GI:  Soft, no obvious guarding and rigidity.  However, the patient is  tender in the lower abdomen.  EXTREMITIES:  No edema.  PSYCHIATRIC:  Oriented x3.  CNS:  Speech intact.  Follows commands.  Motor sensory function appears  to be intact.   LABORATORY DATA:  WBC 6.9, hemoglobin 9.6, hematocrit 32.7, platelet  count 310, MCV 64.6.  Sodium 140, potassium 3.3, chloride 107,  bicarbonate 24, glucose 88, BUN 7, creatinine 0.72, total bili 0.6,  alkaline phosphatase 106, AST 21, ALT 18, total protein 7.7, albumin  4.1, calcium 9.5.  Urinalysis shows amber urine with 100 protein and  trace leukocytes, stool culture with parasite and C. difficile pending  at  this time.   ASSESSMENT AND PLAN:  1. Gastrointestinal bleed.  The patient has painful GI bleed, though      the differential at this time is infectious versus inflammatory      versus ischemia.  Diverticular bleed is less likely as it is      associated with pain.  Stool culture is already requested.  I am      going to admit the patient for at least 24 hours and then again      discontinue if there is no evidence of arrhythmia.  We will check      hemoglobin every 6 hours for initial 24 hours and then daily if H&H      is stable.  GI consult will be carried out in the morning, and      patient can be prepped for a colonoscopy evaluation.  I am going to      order a CT scan to make sure he does not have any significant      ischemic colitis evidence in the imaging.  I do not see the need to      consult surgery at this time.  However, depending on CT scan we may      reconsider that, that patient's blood is already transfused and      stool culture is already pending.  2. Anemia.  The patient has microcytic anemia, most likely is an      deficiency anemia.  I am going to send some iron studies.  At this      time, there is no indication for blood transfusion.  Colonoscopy      which showed more light on why the patient has iron deficiency      anemia.  3. Gastroesophageal reflux disease.  We will continue PPI.  4. Hypertension.  I have added HCTZ as the patient does not remember      what he takes at home and p.r.n. hydralazine is added as well.  5. Hyperkalemia.  I am going to replete with KCL.  6. Gout.  The patient does not have any evidence of gout exacerbation.      However, medication needs to be clarified in the morning.  7. Prophylaxis for DVT and GI on board.   CODE STATUS:  The patient is full code.  TOTAL TIME OF DISCHARGE:  Total time spent in admission of this patient  60 minutes.  The patient will need social work consult for establishing  primary care.  Given  several comorbid conditions, I am going to add a  social work consult as well.   The patient does not have a PCP to which this admission note can be  forwarded.      Theodosia Paling, MD  Electronically Signed     NP/MEDQ  D:  10/28/2008  T:  10/28/2008  Job:  [id number]

## 2010-12-01 NOTE — Discharge Summary (Signed)
NAMEBILLEY, WOJCIAK NO.:  [id number]   MEDICAL RECORD NO.:  [id number]          PATIENT TYPE:  INP   LOCATION:  1408                         FACILITY:  [facility]   PHYSICIAN:  Eduard Clos, MDDATE OF BIRTH:  10/19/1950   DATE OF ADMISSION:  10/27/2008  DATE OF DISCHARGE:  10/30/2008                               DISCHARGE SUMMARY   PRIMARY CARE PHYSICIAN:  Unassigned.   COURSE IN THE HOSPITAL:  A 60 year old male with a previous history of  GI bleed and a colonoscopy in 2007, came with complaints of bleeding per  rectum.  On admission, patient was found to have a hemoglobin of 9.6  which largely stayed stable throughout the hospital course.  His MCV was  64.6 and patient admitted to the medical floor, initially started on  hydration.  Patient also initially started some antihypertensives due to  poor control of blood pressure.   Patient subsequently underwent CT abdomen and pelvis which showed  diffuse colonic wall thickening and surrounding inflammatory change  compatible with colitis, more towards ischemic colitis.  Anyway,  atypical or infective colitis has to be ruled out.   GI consult was obtained with Dr. Loreta Ave who already knows the patient well  and had had a colonoscopy with her in 2007.  Per Dr. Loreta Ave, antibiotics  were discontinued and felt patient diet could be advanced and the diet  was advanced.  At this time, patient's diet is advanced and tolerating a  regular diet.  I discussed with Dr. Loreta Ave, who advised that patient can  be discharged home and she will be following him as outpatient and I did  discuss with the patient and provided Dr. Kenna Gilbert contact number for  which he also said he will be contacting Dr. Loreta Ave.  At the time of this  dictation, patient is hemodynamically stable.   PROCEDURES DONE DURING THIS STAY:  1. CT abdomen and pelvis with contrast on October 28, 2008 showed      diffuse colonic wall thickening and surrounding  inflammatory change      compatible with colitis.  Although potentially ischemic, the      diffuse nature is more typical of an inflammatory colitis  although      infectious colitis should be excluded.  No evidence of abdominal      abscess or bowel obstruction.  Stable left renal cyst.  CT pelvis      showed diffuse colitis ; on further discussion abdominal      impression: No evidence of a pelvis abscess.  2. Acute abdominal CT on October 27, 2008 shows no acute abnormalities,      small to moderate-size hiatal hernia.   PERTINENT LABS:  Patient's WBC on admission was 6.9.  Hemoglobin and  hematocrit on admission were 9.6 and 32.7.  On discharge, they were 9.6  and 30.6.  Patient's ferritin level was 2 with a TIBC of 517.   FINAL DIAGNOSES:  1. Colitis, probably ischemic.  2. Iron deficiency anemia from chronic blood loss.  3. Hypertension.  4. History of gout.  5. History of tobacco abuse.  MEDICATIONS ON DISCHARGE:  1. Clonidine 0.2 mg p.o. b.i.d.  2. Hydralazine 50 mg p.o. 4 times daily.  3. Hydrochlorothiazide 25 mg p.o. daily.  4. Protonix 40 mg p.o. daily.  5. __________ 14 mg orally into 2 weeks followed by 7 mg per day into      2 weeks.  6. Colchicine 0.6 mg p.o. b.i.d.   PLAN:  Patient is provided information about PCP follow-up which he is  very eager and enthusiastic to follow.  I also advised to contact Dr.  Kenna Gilbert office and get an appointment within a week's time, and Dr. Loreta Ave  is also aware the patient getting discharged and Dr. Loreta Ave is going to  arrange followup.  Patient also advised to recheck the CBC with his PCP  or Dr. Loreta Ave within a week's time, for which he has agreed.  In the event  of any recurrence of symptom, patient advised to go to the nearest ER.  Patient has verbalized understanding.      Eduard Clos, MD  Electronically Signed     ANK/MEDQ  D:  10/30/2008  T:  10/30/2008  Job:  [id number]   cc:   Anselmo Rod, M.D.  Fax:  [phone]

## 2010-12-04 NOTE — Op Note (Signed)
NAMESUMMIT, ARROYAVE NO.:  [id number]   MEDICAL RECORD NO.:  [id number]          PATIENT TYPE:  INP   LOCATION:  4707                         FACILITY:  [facility]   PHYSICIAN:  Georgiana Spinner, M.D.    DATE OF BIRTH:  July 02, 1951   DATE OF PROCEDURE:  05/14/2006  DATE OF DISCHARGE:                                 OPERATIVE REPORT   PROCEDURE:  Colonoscopy.   INDICATIONS:  Hemoccult positivity with iron deficiency anemia.   ANESTHESIA:  Demerol 100 mg, Versed 7.5 mg   PROCEDURE:  With the patient mildly sedated in the left lateral decubitus  position the Olympus videoscopic colonoscope was inserted into the rectum  and passed under direct vision to the right colon.  I felt that might be the  cecum although the visualization was poor due to the rather poor prep.  The  patient had thick brown material with a solid material in his colon that  could not be suctioned. Every time we tried to suction wash and suction, the  scope would be clogged with fecal debris so I felt that it was at best a  limited examination.  From this point the colonoscope was then slowly  withdrawn taking circumferential views of colonic mucosa stopping only in  the rectum which appeared normal on direct and showed hemorrhoids on  retroflexed view. The endoscope was straightened and withdrawn.  The  patient's vital signs, pulse oximeter remained stable.  The patient  tolerated procedure well without apparent complications.   FINDINGS:  Limited examination precludes a thorough diagnostic study  therefore, our plan to repeat this examination after a repeat prep.           ______________________________  Georgiana Spinner, M.D.     GMO/MEDQ  D:  05/14/2006  T:  05/15/2006  Job:  [id number]   cc:   Leighton Roach McDiarmid, M.D.  Jordan Hawks Elnoria Howard, MD

## 2010-12-04 NOTE — H&P (Signed)
NAME:  [name], SASAKI NO.:  [id number]   MEDICAL RECORD NO.:  [id number]          PATIENT TYPE:  INP   LOCATION:  4707                         FACILITY:  [facility]   PHYSICIAN:  Barth Kirks, M.D.  DATE OF BIRTH:  January 25, 1951   DATE OF ADMISSION:  05/12/2006  DATE OF DISCHARGE:                                HISTORY & PHYSICAL   HISTORY OF PRESENT ILLNESS:  This is a 60 year old male with a history of  BPH and hypertension, who presents to the emergency room with anemia,  suprapubic/lower abdominal pain, and chest pain.  The patient arrived to the  hospital via EMS when his primary care physician, Dr. Audria Nine of  [facility] notified patient of a abnormal blood test of 6.  Dr. Audria Nine  had the patient transferred from the primary care office to [facility] ER by  EMS for evaluation.  Patient says that his low abdominal pain has been going  on for 1 month.  During this time he has also been short of breath even at  rest.  He describes the lower abdominal pain as 8 and 1/2 out of 10.  Heat  makes the abdominal pain better and the pain is worse with drinking fluids.  Pain is more so pressure in quality and not sharp.  Patient says the pain  radiates all over.  Patient says he has been having obstructive symptoms of  urination for which he takes Cardura.  His stool is sometimes black and he  feels more constipated.  Positive nausea and vomiting only when eating large  amounts of food.  No fevers, no weight loss, no changes in weight.  Patient  has never had a colonoscopy or upper GI endoscopy.  He has never had his  cholesterol checked.  His family history is positive for colon cancer in his  sister at an early age of 59 years old.  He endorses chest pain that is  substernal and intermittent and goes away on its own.  He takes at least 4  Aleve each day for his osteoarthritis.  He also endorses constipation.   PAST MEDICAL HISTORY:  1. Hypertension.  2. Benign  prostatic hypertrophy.   MEDICATIONS:  1. Cardura 4 mg by mouth every day.  2. Aleve 4 tabs every day.  3. Stool softener.   PAST SURGICAL HISTORY:  Patient has had multiple surgeries which include the  following:  1. Small-bowel obstruction in 2001.  2. Ruptured appendix in 1969.  3. 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   FAMILY HISTORY:  Colon cancer at an early age of 62 years old in the  patient's sister.  Heart transplant in patient's brother.  Mother died of  lymphoma.  Father died of Alzheimer's disease.   SOCIAL HISTORY:  Patient denies smoking.  Denies drinking alcohol.  Denies  use of illegal drugs.  He says he is an ex-addict of cocaine.  Patient says  he works for a [employer] and does a lot of lifting of heavy objects.   REVIEW OF SYSTEMS:  Positive for diffuse cramping.  Patient states all of  his joints hurt, no headache, glaucoma in right eye.  No syncope, no sick  contacts.   PHYSICAL EXAM:  GENERAL:  Patient is lying in the bed in no acute distress.  VITAL SIGNS:  His blood pressure is 134/77, pulse 64, respiratory rate 21.  He is 100% O2 saturation on 2 liters of oxygen by nasal cannula.  LUNGS:  Clear to auscultation bilaterally.  No clubbing, cyanosis, or edema.  No use of secondary accessory muscles to breathe.  ABDOMEN:  Bowel sounds are positive, there is a large vertical mid line  scar, no masses, no costal vertebral angle tenderness.  CARDIOVASCULAR:  Regular rate and rhythm with positive 2+ systolic ejection  murmur, 2+ pulses throughout, no JVD, no leg edema bilaterally.  HEENT:  Normocephalic, atraumatic.  Extraocular muscles intact.  Pupils  equal, round, and reactive to light and accommodation.  Oropharynx:  Moist,  no exudate.  No cervical or supraclavicular adenopathy.  NEURO:  Alert and oriented x3, 5/5 strength throughout, good sensation  throughout.  Cranial nerves II-XII grossly intact.    LAB STUDIES:  Sodium 141, potassium 3.9, chloride 110, bicarbonate 21.3, BUN  17, glucose 92.  White blood cell count 3.6, hemoglobin 6.5, hematocrit  21.8, platelets 290.  Urine has a specific gravity of 1.028, negative  protein, negative leucocyte esterase, negative nitrite.  PT is 17.1.  INR  1.4.  PTT is 36.   ASSESSMENT AND PLAN:  This is a 60 year old male with lack of primary care,  hypertension, benign prostatic hypertrophy who presents to the emergency  room with anemia, lower abdominal pain, and chest pain.  1. Anemia.  The patient's anemia is at 6.5, the patient is not in      distress, this is likely a chronic anemia.  His black stools are      suspect, this anemia may also contribute to his chest pain, the      differential diagnosis includes iron deficiency anemia or anemia of      chronic disease.  Blood loss is likely occurring into the GI lumen.  We      will get CBC and iron studies.  We will type and screen for blood      transfusion.  We will order fecal occult blood testing for the stools      and consult GI in the morning.  2. Lower abdominal pain.  Differential diagnosis includes colon cancer,      external hemorrhoids, kidney stones, pyelonephritis, small-bowel      obstruction, or excess Aleve irritating the stomach.  This may relate      to the patient's anemia.  History of colon cancer in sister makes colon      cancer suspect.  We will initiate workup with upper and lower      endoscopies and search for an etiology.  We will start Protonix.  3. Chest pain.  Patient likely high risk for myocardial infarction given      lack of primary health care.  We will rule out myocardial infarction      with enzymes x3.  We will get an echo to assess the flow murmur.  4. Benign prostatic hypertrophy.  We will continue patient's outpatient      medicines of Cardura.  5. Prophylaxis.  Patient will be prescribed  Tylenol for pain as needed.     We will assess fasting lipid  profile and HBA1c.  We will start deep      vein thrombosis prophylaxis with SCD's.   DISPOSITION:  To home status post workup.      Barth Kirks, M.D.     MB/MEDQ  D:  05/13/2006  T:  05/14/2006  Job:  [id number]

## 2010-12-04 NOTE — Op Note (Signed)
[name], [name] NO.:  [id number]   MEDICAL RECORD NO.:  [id number]          PATIENT TYPE:  INP   LOCATION:  4707                         FACILITY:  [facility]   PHYSICIAN:  Anselmo Rod, M.D.  DATE OF BIRTH:  09/28/1950   DATE OF PROCEDURE:  05/16/2006  DATE OF DISCHARGE:  05/17/2006                                 OPERATIVE REPORT   PROCEDURE PERFORMED:  Colonoscopy with multiple cold biopsies.   ENDOSCOPIST:  Anselmo Rod, M.D.   INSTRUMENT USED:  Olympus video colonoscope.   INDICATION FOR PROCEDURE:  This is a 60 year old African American male with  a history of anemia and blood in stool; rule out colon polyps, masses, etc.   PREPROCEDURE PREPARATION:  Informed consent was procured from the patient.  The patient was fasted for 8 hours prior to the procedure and prepped with  TriLyte the night prior to the procedure.  Risks and benefits of the  procedure including a 10% missed rate of cancer in polyps were discussed  with the patient as well.   PREPROCEDURE PHYSICAL:  VITAL SIGNS:  The patient had stable vital signs.  NECK:  Supple.  CHEST:  Clear to auscultation.  CARDIAC:  S1 and S2 regular.  ABDOMEN:  Soft with normal bowel sounds.   DESCRIPTION OF THE PROCEDURE:  The patient was placed in the left lateral  decubitus position and sedated with 75 mcg of fentanyl and 7.5 mg of Versed  in slow incremental doses.  Once the patient was adequately sedate and  maintained on low-flow oxygen and continuous cardiac monitoring, the Olympus  video colonoscope was advanced from the rectum to the cecum.  Prominent  internal hemorrhoids were seen on retroflexion.  Small polyps were biopsied  from the rectosigmoid colon and rectum.  There was a large amount of black  debris in the colon; multiple washings were done; small lesions could have  been missed.  The appendiceal orifice and ileocecal valve were clearly  visualized and photographed.  The patient  tolerated the procedure well  without immediate complications.   IMPRESSION:  1. Large amount of black debris in the colon; visualization was      suboptimal.  2. Prominent internal hemorrhoids.  3. Small sessile polyps biopsied from the rectum and rectosigmoid colon      (cold biopsies x3).   RECOMMENDATIONS:  1. Await pathology results.  2. A small bowel capsule study will be planned after the patient has been      taken off the iron supplements for a      week.  3. Further recommendations will be made thereafter.   COMMENT:  Visualization of the colon was suboptimal and this has been  discussed with the patient.      Anselmo Rod, M.D.  Electronically Signed     JNM/MEDQ  D:  05/19/2006  T:  05/20/2006  Job:  [id number]   cc:   Leighton Roach McDiarmid, M.D.

## 2010-12-04 NOTE — Discharge Summary (Signed)
[facility]  Patient:    [name], [name]                     MRN: [id number] Adm. Date:  [id number] Disc. Date: [id number] Attending:  Meredith Leeds CC:         Corwin Levins, M.D. [facility]   Discharge Summary  HISTORY OF PRESENT ILLNESS:  The patient is a 60 year old black male who had recently undergone small bowel resection and lysis of adhesions for acute small intestinal obstruction. He had a fairly prolonged ileus after the operation but he got over it and was discharged on a regular diet. He returned on the day of admission, April 23, 2000 with distended abdomen, abdominal pain and vomiting. Abdominal x-rays were consistent with small bowel obstruction. The patients history is remarkable in that he is at a treatment facility for alcohol and drugs. He has high blood pressure. He is cared for by Dr. Oliver Barre. See history and physical for further details.  HOSPITAL COURSE:  The patient was admitted and put on nasogastric suction. X-rays and clinical course showed improvement. He was able to have the nasogastric tube removed on April 26, 2000. The next day, I gave him a diet and advanced it and he tolerated it well. The patient did have a wound infection which had to be opened early during his admission. This was treated with dressing changes and arrangements were made for follow-up by skilled nurse to change his bandage for him on an ongoing basis. The patient is discharged on April 28, 2000 to [facility] and will see me in the office in several days.  DISCHARGE DIAGNOSES: 1. Small intestinal obstruction, resolved. 2. Wound infection, improved.  OPERATION PERFORMED:  None.  CONDITION ON DISCHARGE:  Improved. DD:  05/10/00 TD:  05/10/00 Job: [id number]

## 2010-12-04 NOTE — Discharge Summary (Signed)
[facility]  Patient:    [name], [name]                     MRN: [id number] Adm. Date:  [id number] Disc. Date: [id number] Attending:  Corwin Levins CC:         With patient to [facility]   Discharge Summary  ADMISSION DIAGNOSES: 1. Abdominal pain. 2. Small bowel obstruction.  DISCHARGE DIAGNOSES: 1. Small bowel obstruction secondary to abdominal adhesions, status post    surgical consult and intervention.  The patient had laparotomy with    lysis of adhesions and partial small bowel obstruction by Dr. Orson Slick    on April 08, 2000. 2. Hypertension.  HOSPITAL COURSE:  The patient was admitted as an unassigned patient through the EDP for abdominal pain with small bowel obstruction by Dr. 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  A surgical consultation was obtained on April 06, 2000 for small bowel obstruction.  An NG tube was placed and the patient was npo.  The patients small bowel obstruction did not resolve and he was taken to surgery on April 08, 2000 for lysis of adhesions and partial small bowel resection.  Postoperative, the patient had postoperative ileus and required NG placement and IV fluid support.  Postoperative mild anemia and hypertension were controlled with IV medications and fluid support.  The patients postoperative course was complicated a fever spike and fever workup which revealed an elevated white count and a urine culture which was positive for coag negative Staphylococcus; however, blood cultures times two sets were negative.  He was begun empirically on Vancomycin.  He defervesced.  A consultation with infectious disease, Dr. Lina Sayre over the phone, recommended that he e converted to doxycycline 100 mg p.o. b.i.d. for an additional two weeks after discharge.  He received a total of four doses of Vancomycin and was  converted to p.o. doxycycline and remained afebrile.  Discharge planning was consulted to assist the patients discharge.  After surgery, removed the staples and felt he was appropriate for discharge at this point.  He was tolerating p.o. liquids and p.o. antihypertensives.  He is discharged to [facility] where he will be assisted in remaining free of polysubstance abuse.  DISCHARGE INSTRUCTIONS:  Appropriate wound care.  Cleaning the site with hydrogen peroxide as necessary, keeping the wound site clean and clear.  He should follow up with the surgeon, Dr. Lebron Conners within two weeks. He is to continue Protonix 40 mg p.o. q.d., doxycycline 100 mg p.o. b.i.d. for fourteen days, Catapres patch one weekly to skin, Lopressor 25 mg twice a day, Niferex 40 one p.o. q.d. for 30 days and Tylox 30 mg one every four hours for severe pain.  Prescriptions were written that will be filled here for a 30 day supply from the hospital indigent program and long term medications will be obtained via [facility].  He is to follow up with [facility] for his health needs and an application to the [facility] for continuing care since the patient had an honorable discharge from the military is recommended.  Instructions were written out and given to the patient and the patients family.  The patient is discharged to the [facility] in improved condition. DD:  04/19/00 TD:  04/19/00 Job: [id number]

## 2010-12-04 NOTE — Discharge Summary (Signed)
NAMEAARO, MEYERS NO.:  [id number]   MEDICAL RECORD NO.:  [id number]          PATIENT TYPE:  INP   LOCATION:  4707                         FACILITY:  [facility]   PHYSICIAN:  Zenaida Deed. Mayford Knife, M.D.DATE OF BIRTH:  08-Dec-1950   DATE OF ADMISSION:  05/12/2006  DATE OF DISCHARGE:  05/17/2006                                 DISCHARGE SUMMARY   PRIMARY CARE PHYSICIAN:  Maurice March, M.D. (fax #[phone])   CONSULTATION:  Anselmo Rod, M.D., GI specialist   PROCEDURE:  1. On 05/12/2006, abdominal x-ray showed no acute abdominal findings.  2. On 05/13/2006, a 2D echo showed left ventricular ejection fraction of      60%.  Mild aortic regurgitation, mild left atrial dilation and focal      basal septal hypertrophy.  3. On 05/13/2006, upper endoscopy showed mild esophagitis, a hiatal      hernia.  No active bleeding.  Mild ulceration of the duodenum, likely      post surgical.  4. On 05/14/2006, a colonoscopy that was a limited study secondary to      failed bowel prep.  5. On 05/16/2006, repeat colonoscopy that showed no active bleeding,      multiple polyps removed, still limited study due to poor bowel prep.   REASON FOR ADMISSION:  Evaluation and treatment of anemia, lower abdominal  pain and chest pain.   PRIMARY DIAGNOSES:  1. Melena of unknown source.  2. Anemia.  3. Lower abdominal pain.  4. Chest pain.   SECONDARY DIAGNOSES:  1. Hypertension.  2. Benign prostatic hypertrophy.   DISCHARGE MEDICATIONS:  1. Cardura 4 mg 1 tablet everyday.  2. Colace 1 tablet two times a day for constipation.  3. Protonix 40 mg 2 tablets everyday.  4. Lisinopril 5 mg 1 tablet everyday.  5. Tylenol 500 mg, take no more than 8 tablets in a day for pain as      needed.  6. Precaution, absolutely no NSAIDs or ibuprofen, Aleve or aspirin.   DISPOSITION:  The patient will be discharged to his home.  He will continue  his outpatient drug regimen.  He will not  take iron by mouth as he is  scheduled to undergo capsule endoscopy within the next couple of weeks.  He  was in no acute distress on discharge.   DISCHARGE FOLLOWUP:  1. Dr. Loreta Ave at [facility] on 05/26/2006, at 7:45 a.m.  2. Dr. Audria Nine at [facility] on 05/28/2006. The      patient will need to make this appointment on his own one day prior to      05/28/2006.   FOLLOWUP ISSUES:  1. The patient is scheduled to undergo capsule endoscopy on 05/26/2006.  He      will need to remain off oral iron until that time, he can restart iron      after that.  2. IV iron will need to be prescribed by the primary care physician to      this patient.  This is a written order for the primary care physician  to administer to the patient 25 mg IV iron as a test dose.  If there is      no allergic reaction, the patient can receive 325 mg of IV iron each      day for 2 consecutive days.  This is important to replete the patient's      iron stores especially concerning since he had a ferritin of 1 while      inpatient.  3. The patient will have to follow up on the results of his biopsies taken      from the second colonoscopy.   DISCHARGE CONDITION:  The patient was clinically stable.  He had no dyspnea  or tachycardia.  He was in no acute distress.   DISCHARGE LABS:  The patient had a basic metabolic panel that was within  normal limits.  His CBC on the day of discharge was significant for the  following:  White blood cell count 5.6, H&H 9.1 and 28.6, platelets 269,000.  MCV 67.4.   HOSPITAL COURSE:  1. Melena of unknown source.  Workup while inpatient included numerous      studies.  Initially the patient had an endoscopy study that was      negative for active bleeding.  He then had a colonoscopy that was also      negative and limited secondary to improper colon prep.  He had a repeat      colonoscopy that did not show any active bleeding, lesions,  however,      multiple biopsies of polyps were taken.  Dr. Loreta Ave has recommended that      this patient continue his outpatient workup with a capsule endoscopy      study that will take place on 05/26/2006.  The patient is to not take      any oral iron pills up until then.  2. Anemia.  It was reported that the patient had a hemoglobin of 6 at his      outpatient [facility]'s office.  He was then sent to the [facility] ER where he had a hemoglobin of 8.2.  He received a      transfusion on arrival to the ER and he also received another      transfusion during his hospital stay as an inpatient.  On the day of      discharge his hemoglobin was 9.1 and the patient was asymptomatic.  He      had significant labs that included a ferritin of 1, iron of 11, a TIBC      of 489 and a percent saturation of 2.  His urinalysis was negative.      His ABG showed a pH of 7.37, PCO2 was 36.4.  His initial cardiac      markers were negative.  We supplemented the patient with p.o. iron      pills for a period of time during his inpatient stay, however, this was      stopped on the day of discharge so that the patient could undergo an      accurate capsule endoscopy study.  He is found to be O positive for his      blood type.  3. Lower abdominal pain.  The patient presented with lower abdominal pain      that was midline and suprapubic.  Over time this resolved, the etiology      is likely musculoskeletal  especially given the  patient's history for      lifting heavy objects.  4. Chest pain.  The patient presented with a substernal chest pain that      came on intermittently.  His EKG at that time was negative for      ischemia.  His cardiac enzymes x2 were also negative.  Over time, this      also resolved by the day of discharge.  The source of this chest pain      was likely musculoskeletal or it could be gastroesophageal reflux     disease.  The patient is being sent out on Protonix 40 mg  2 tablets      everyday.  This will combat any gastroesophageal reflux and also      protect his stomach in light of an unknown likely GI bleed.      Significant labs for this workup included an A1c of 5.9.  5. BPH.  The patient has BPH.  During his inpatient stay he complained of      no problems and we continued his outpatient drug regimen of Cardura.  6. Hypertension.  The patient's hypertension is being treated with      Cardura, however his blood pressures remained elevated throughout the      hospital stay and it was decided to start lisinopril at a low dose of 5      mg to take by      mouth everyday.  This intervention did not significantly improve his      blood pressure and may need to be titrated up.  On the day of discharge      his blood pressures were still elevated and ranged from 152 to 175 over      88 to 91.      Barth Kirks, M.D.    ______________________________  Zenaida Deed. Mayford Knife, M.D.    MB/MEDQ  D:  05/17/2006  T:  05/18/2006  Job:  [id number]   cc:   Maurice March, M.D.

## 2010-12-04 NOTE — Op Note (Signed)
[facility]  Patient:    [name], [name]                     MRN: [id number] Proc. Date: 04/08/00 Adm. Date:  [id number] Attending:  Corwin Levins CC:         Corwin Levins, M.D. [facility]   Operative Report  PREOPERATIVE DIAGNOSES:  Small intestinal obstruction due to adhesions.  POSTOPERATIVE DIAGNOSES:  Small intestinal obstruction due to adhesions.  OPERATION PERFORMED:  Lysis of adhesions and small bowel resection.  SURGEON:  Dr. Orson Slick.  ANESTHESIA:  General.  DESCRIPTION OF PROCEDURE:  After adequate monitoring and anesthesia and routine preparation and draping of the abdomen, I excised his old midline skin scar. I dissected down through the scar tissue and fascia and entered the abdominal cavity. There was a little bit of free space toward the lower end of the the wound. Working upward from that, I took down adhesions to the anterior abdominal wall. There was a large amount of serosanguineous fluid present within the abdominal cavity. Portions of the small bowel were extremely dilated and portions were decompressed. I took down the anterior abdominal wall adhesions and then freed up small bowel mass bringing it up out of the pelvis. It was particularly adherent in the right upper quadrant. Taking it down from there, there was spontaneous rupture of a loop of small bowel. I sucked as much of the ______ out as I could and then I patched that segment with several sutures of 3-0 silk. At that time, it appeared viable. I then further dissected out the adhesions completely freeing up all the adhesions of the small bowel. In the mid small bowel, I found an area where there was an acute decrease in the caliber of the bowel at the site of an acute twist and kink due to adhesions. After releasing that, succuss went through nicely. I squeezed as much as I could of the air and succuss back up into the stomach for removal through the nasogastric tube. I was  able to remove about 2 liters of fluid that way. That decompressed him quite nicely. I copiously irrigated the abdominal cavity with 3 liters of saline solution and then checked for good hemostasis. When I then inspected the area where the small bowel had ruptured, I found that it was very dusky and I was very worried about the viability. I resected it proximally and distally with the cutting stapler and then did a side to side anastomosis near the end of the bowel with the cutting stapler. I closed the remaining hole with a linear stapler after making sure there was good hemostasis of the staple lines within. Everything looked good at that point. I closed the mesenteric defect with 3-0 silk sutures. I then further inspected the small bowel and saw no other areas I was concerned about. Sponge, needle and instrument counts were correct. I closed the fascia with running #1 PDS beginning at both ends and tying knots in the middle. I closed the skin with staples after freeing up the skin edges a little bit and irrigation the subcutaneous tissues. The patient tolerated the procedure well and was stable on going to the recovery room. DD:  04/08/00 TD:  04/11/00 Job: [id number]

## 2010-12-04 NOTE — H&P (Signed)
[facility]  Patient:    [name], [name]                     MRN: [id number] Adm. Date:  [id number] Attending:  Donnetta Hutching                         History and Physical  CHIEF COMPLAINT:  Abdominal pain.  HISTORY OF PRESENT ILLNESS:  [name] is a 60 year old black male without primary M.D., who was admitted as unassigned.  He had acute onset and rapid worsening of diffuse sharp abdominal pain since 10:30 p.m. last evening with vomiting x 1 this morning with _____ material.  There is no fevers or chills, no prior history of obstruction or cancer.  He had a small bowel movement yesterday.  He passed _____ his morning.  Abdominal CT scan in the ER with small bowel obstruction.  Pain approximately 8 out of 10, and he is getting morphine IV in the ER.  PAST MEDICAL HISTORY: 1. Peptic ulcer disease, status post seven gastric surgeries in the late 1960s    or early 1970s. 2. History of anemia with the above, presumed resolved. 3. Hypertension. 4. Polysubstance abuse with alcohol, cocaine, marijuana. 5. History of heat stroke in 1981, apparently hospitalized in an [place]    [place], hospital, where he had some renal insufficiency, apparently    resolved as well. 7. Status post appendectomy in 1969.  ALLERGIES:  PENICILLIN, SUDAFED.  MEDICATIONS:  Antibiotic for lower lip cellulitis.  SOCIAL HISTORY:  Tobacco, three-quarter pack per day.  No alcohol for 30 days. Last cocaine March 20, 2000.  Has a live-in girlfriend.  Unemployed.  One of four children.  FAMILY HISTORY:  Diabetes, hypertension, and mother with lymph node cancer.  REVIEW OF SYSTEMS:  Has for three weeks noticed a lesion on the mid-lower lip with lumps under the chin, treated with antibiotics per [place], with little result or help.  PHYSICAL EXAMINATION:  GENERAL:  [name] is a 60 year old black male in acute distress due to pain.  VITAL SIGNS:  Temperature 97.0,  respiratory rate 18, pulse 64, blood pressure 182/109.  HEENT:  Sclerae icteric.  Mid-lower lip:  A 1 cm ulcerative lesion with bilateral submandibular lymphadenopathy noted.  NECK:  Otherwise without lymphadenopathy, JVD, thyromegaly.  CHEST:  Without rales or wheezing.  HEART:  Regular rate and rhythm.  ABDOMEN:  Diffusely tender, questionably worse in the right midabdomen, and there are decreased bowel sounds, positive guarding, no rebound.  EXTREMITIES:  No edema.  NEUROLOGIC:  Cranial nerves II-XII, otherwise nonfocal.  LABORATORY DATA:  Abdomen ultrasound:  Abdominal aorta 2.8 cm, otherwise not helpful.  Abdominal CT consistent with mid- to distal small bowel obstruction and constipation.  CBC with white count 10.0, hemoglobin 14.1, platelets 369.  CMET with glucose 122, otherwise within normal limits.  Lipase 108.  ECG with questionable septal infarct, otherwise sinus, no acute changes.  ASSESSMENT AND PLAN: 1. Small bowel obstruction.  He will be made n.p.o., given IV fluids as well    as pain medications on a p.r.n. basis.  Surgical consult called, Sharlet Salina    T. Hoxworth, M.D., for [facility]. 2. Hypertension.  Start Lopressor IV 5 mg b.i.d. 3. Abnormal ECG, questionable septal infarct with history of cocaine use.    Will check 2D echocardiogram but need more evidence for such.  Would not    hold emergent surgery if needed.  4. Consider cardiology consultation for any abnormal echocardiogram. 5. Polysubstance abuse. 6. Lower lip lesion with submandibular lymphadenopathy.  Highly suspect a    malignant lesion, with question of metastasis.  Will get ENT to see but can    wait for small bowel obstruction evaluation and may be done as an    outpatient. DD:  04/06/00 TD:  04/07/00 Job: [id number]

## 2010-12-23 ENCOUNTER — Emergency Department (HOSPITAL_COMMUNITY)
Admission: EM | Admit: 2010-12-23 | Discharge: 2010-12-23 | Disposition: A | Discharge status: Home / Self Care | Payer: Medicare Other | Attending: Emergency Medicine | Admitting: Emergency Medicine

## 2010-12-23 DIAGNOSIS — F1010 Alcohol abuse, uncomplicated: Secondary | ICD-10-CM | POA: Insufficient documentation

## 2010-12-23 DIAGNOSIS — I10 Essential (primary) hypertension: Secondary | ICD-10-CM | POA: Insufficient documentation

## 2010-12-23 LAB — DIFFERENTIAL
Basophils Absolute: 0 10*3/uL (ref 0.0–0.1)
Basophils Relative: 1 % (ref 0–1)
Eosinophils Absolute: 0 10*3/uL (ref 0.0–0.7)
Eosinophils Relative: 0 % (ref 0–5)
Lymphocytes Relative: 41 % (ref 12–46)
Lymphs Abs: 2 10*3/uL (ref 0.7–4.0)
Monocytes Absolute: 0.4 10*3/uL (ref 0.1–1.0)
Monocytes Relative: 7 % (ref 3–12)
Neutro Abs: 2.4 10*3/uL (ref 1.7–7.7)
Neutrophils Relative %: 50 % (ref 43–77)

## 2010-12-23 LAB — RAPID URINE DRUG SCREEN, HOSP PERFORMED
Amphetamines: NOT DETECTED
Barbiturates: NOT DETECTED
Benzodiazepines: NOT DETECTED
Cocaine: NOT DETECTED
Opiates: NOT DETECTED
Tetrahydrocannabinol: POSITIVE — AB

## 2010-12-23 LAB — CBC
HCT: 42.2 % (ref 39.0–52.0)
Hemoglobin: 14 g/dL (ref 13.0–17.0)
MCH: 26.3 pg (ref 26.0–34.0)
MCHC: 33.2 g/dL (ref 30.0–36.0)
MCV: 79.3 fL (ref 78.0–100.0)
Platelets: 236 10*3/uL (ref 150–400)
RBC: 5.32 MIL/uL (ref 4.22–5.81)
RDW: 16.4 % — ABNORMAL HIGH (ref 11.5–15.5)
WBC: 4.8 10*3/uL (ref 4.0–10.5)

## 2010-12-23 LAB — COMPREHENSIVE METABOLIC PANEL
ALT: 32 U/L (ref 0–53)
AST: 39 U/L — ABNORMAL HIGH (ref 0–37)
Albumin: 3.7 g/dL (ref 3.5–5.2)
Alkaline Phosphatase: 117 U/L (ref 39–117)
BUN: 16 mg/dL (ref 6–23)
CO2: 24 mEq/L (ref 19–32)
Calcium: 8.7 mg/dL (ref 8.4–10.5)
Chloride: 102 mEq/L (ref 96–112)
Creatinine, Ser: 0.91 mg/dL (ref 0.4–1.5)
GFR calc Af Amer: >60 mL/min (ref >60)
GFR calc non Af Amer: >60 mL/min (ref >60)
Glucose, Bld: 88 mg/dL (ref 70–99)
Potassium: 4.1 mEq/L (ref 3.5–5.1)
Sodium: 138 mEq/L (ref 135–145)
Total Bilirubin: 0.6 mg/dL (ref 0.3–1.2)
Total Protein: 7.6 g/dL (ref 6.0–8.3)

## 2010-12-23 LAB — ETHANOL: Alcohol, Ethyl (B): <11 mg/dL — ABNORMAL HIGH (ref 0–10)

## 2011-03-02 ENCOUNTER — Emergency Department (HOSPITAL_COMMUNITY)
Admission: EM | Admit: 2011-03-02 | Discharge: 2011-03-02 | Disposition: A | Discharge status: Home / Self Care | Payer: Medicare Other | Attending: Emergency Medicine | Admitting: Emergency Medicine

## 2011-03-02 ENCOUNTER — Emergency Department (HOSPITAL_COMMUNITY): Payer: Medicare Other

## 2011-03-02 ENCOUNTER — Encounter (HOSPITAL_COMMUNITY): Payer: Self-pay | Admitting: Radiology

## 2011-03-02 DIAGNOSIS — K449 Diaphragmatic hernia without obstruction or gangrene: Secondary | ICD-10-CM | POA: Insufficient documentation

## 2011-03-02 DIAGNOSIS — R109 Unspecified abdominal pain: Secondary | ICD-10-CM | POA: Insufficient documentation

## 2011-03-02 DIAGNOSIS — I10 Essential (primary) hypertension: Secondary | ICD-10-CM | POA: Insufficient documentation

## 2011-03-02 DIAGNOSIS — R197 Diarrhea, unspecified: Secondary | ICD-10-CM | POA: Insufficient documentation

## 2011-03-02 DIAGNOSIS — I2510 Atherosclerotic heart disease of native coronary artery without angina pectoris: Secondary | ICD-10-CM | POA: Insufficient documentation

## 2011-03-02 DIAGNOSIS — R112 Nausea with vomiting, unspecified: Secondary | ICD-10-CM | POA: Insufficient documentation

## 2011-03-02 DIAGNOSIS — K5289 Other specified noninfective gastroenteritis and colitis: Secondary | ICD-10-CM | POA: Insufficient documentation

## 2011-03-02 LAB — COMPREHENSIVE METABOLIC PANEL
ALT: 25 U/L (ref 0–53)
AST: 30 U/L (ref 0–37)
Albumin: 3.7 g/dL (ref 3.5–5.2)
Alkaline Phosphatase: 140 U/L — ABNORMAL HIGH (ref 39–117)
BUN: 13 mg/dL (ref 6–23)
CO2: 25 mEq/L (ref 19–32)
Calcium: 9.2 mg/dL (ref 8.4–10.5)
Chloride: 104 mEq/L (ref 96–112)
Creatinine, Ser: 0.85 mg/dL (ref 0.50–1.35)
GFR calc Af Amer: >60 mL/min (ref >60)
GFR calc non Af Amer: >60 mL/min (ref >60)
Glucose, Bld: 83 mg/dL (ref 70–99)
Potassium: 3.9 mEq/L (ref 3.5–5.1)
Sodium: 139 mEq/L (ref 135–145)
Total Bilirubin: 0.3 mg/dL (ref 0.3–1.2)
Total Protein: 7.6 g/dL (ref 6.0–8.3)

## 2011-03-02 LAB — CBC
HCT: 38.5 % — ABNORMAL LOW (ref 39.0–52.0)
Hemoglobin: 12.3 g/dL — ABNORMAL LOW (ref 13.0–17.0)
MCH: 26.4 pg (ref 26.0–34.0)
MCHC: 31.9 g/dL (ref 30.0–36.0)
MCV: 82.6 fL (ref 78.0–100.0)
Platelets: 306 10*3/uL (ref 150–400)
RBC: 4.66 MIL/uL (ref 4.22–5.81)
RDW: 15.6 % — ABNORMAL HIGH (ref 11.5–15.5)
WBC: 4.3 10*3/uL (ref 4.0–10.5)

## 2011-03-02 LAB — DIFFERENTIAL
Basophils Absolute: 0.1 10*3/uL (ref 0.0–0.1)
Basophils Relative: 1 % (ref 0–1)
Eosinophils Absolute: 0.1 10*3/uL (ref 0.0–0.7)
Eosinophils Relative: 1 % (ref 0–5)
Lymphocytes Relative: 35 % (ref 12–46)
Lymphs Abs: 1.5 10*3/uL (ref 0.7–4.0)
Monocytes Absolute: 0.4 10*3/uL (ref 0.1–1.0)
Monocytes Relative: 10 % (ref 3–12)
Neutro Abs: 2.3 10*3/uL (ref 1.7–7.7)
Neutrophils Relative %: 53 % (ref 43–77)

## 2011-03-02 LAB — LIPASE, BLOOD: Lipase: 18 U/L (ref 11–59)

## 2011-03-02 MED ORDER — IOHEXOL 300 MG/ML  SOLN
100.0000 mL | Freq: Once | INTRAMUSCULAR | Status: AC | PRN
  Administered 2011-03-02: 100 mL via INTRAVENOUS

## 2012-01-26 ENCOUNTER — Emergency Department (HOSPITAL_COMMUNITY)
Admission: EM | Admit: 2012-01-26 | Discharge: 2012-01-27 | Disposition: A | Discharge status: Home / Self Care | Payer: Medicare Other | Attending: Emergency Medicine | Admitting: Emergency Medicine

## 2012-01-26 ENCOUNTER — Encounter (HOSPITAL_COMMUNITY): Payer: Self-pay | Admitting: *Deleted

## 2012-01-26 DIAGNOSIS — K625 Hemorrhage of anus and rectum: Secondary | ICD-10-CM

## 2012-01-26 DIAGNOSIS — Z8546 Personal history of malignant neoplasm of prostate: Secondary | ICD-10-CM | POA: Insufficient documentation

## 2012-01-26 DIAGNOSIS — K449 Diaphragmatic hernia without obstruction or gangrene: Secondary | ICD-10-CM | POA: Insufficient documentation

## 2012-01-26 DIAGNOSIS — R109 Unspecified abdominal pain: Secondary | ICD-10-CM | POA: Insufficient documentation

## 2012-01-26 NOTE — ED Notes (Signed)
Pt in c/o intermittent rectal bleeding x2 months, symptoms returned today, also c/o prostate pain and lower abd pain

## 2012-01-27 ENCOUNTER — Emergency Department (HOSPITAL_COMMUNITY): Payer: Medicare Other

## 2012-01-27 LAB — CBC WITH DIFFERENTIAL/PLATELET
Basophils Absolute: 0 10*3/uL (ref 0.0–0.1)
Basophils Relative: 0 % (ref 0–1)
Eosinophils Absolute: 0.1 10*3/uL (ref 0.0–0.7)
Eosinophils Relative: 1 % (ref 0–5)
HCT: 38.7 % — ABNORMAL LOW (ref 39.0–52.0)
Hemoglobin: 11.9 g/dL — ABNORMAL LOW (ref 13.0–17.0)
Lymphocytes Relative: 36 % (ref 12–46)
Lymphs Abs: 2.1 10*3/uL (ref 0.7–4.0)
MCH: 24.6 pg — ABNORMAL LOW (ref 26.0–34.0)
MCHC: 30.7 g/dL (ref 30.0–36.0)
MCV: 80.1 fL (ref 78.0–100.0)
Monocytes Absolute: 0.6 10*3/uL (ref 0.1–1.0)
Monocytes Relative: 10 % (ref 3–12)
Neutro Abs: 3.1 10*3/uL (ref 1.7–7.7)
Neutrophils Relative %: 53 % (ref 43–77)
Platelets: 262 10*3/uL (ref 150–400)
RBC: 4.83 MIL/uL (ref 4.22–5.81)
RDW: 17.2 % — ABNORMAL HIGH (ref 11.5–15.5)
WBC: 5.9 10*3/uL (ref 4.0–10.5)

## 2012-01-27 LAB — URINALYSIS, ROUTINE W REFLEX MICROSCOPIC
Glucose, UA: NEGATIVE mg/dL
Nitrite: NEGATIVE
Protein, ur: 30 mg/dL — AB
Specific Gravity, Urine: 1.036 — ABNORMAL HIGH (ref 1.005–1.030)
Urobilinogen, UA: 1 mg/dL (ref 0.0–1.0)
pH: 5.5 (ref 5.0–8.0)

## 2012-01-27 LAB — COMPREHENSIVE METABOLIC PANEL
ALT: 18 U/L (ref 0–53)
AST: 23 U/L (ref 0–37)
Albumin: 4 g/dL (ref 3.5–5.2)
Alkaline Phosphatase: 119 U/L — ABNORMAL HIGH (ref 39–117)
BUN: 17 mg/dL (ref 6–23)
CO2: 23 mEq/L (ref 19–32)
Calcium: 9.1 mg/dL (ref 8.4–10.5)
Chloride: 103 mEq/L (ref 96–112)
Creatinine, Ser: 1.05 mg/dL (ref 0.50–1.35)
GFR calc Af Amer: 86 mL/min — ABNORMAL LOW (ref >90)
GFR calc non Af Amer: 74 mL/min — ABNORMAL LOW (ref >90)
Glucose, Bld: 99 mg/dL (ref 70–99)
Potassium: 3.4 mEq/L — ABNORMAL LOW (ref 3.5–5.1)
Sodium: 137 mEq/L (ref 135–145)
Total Bilirubin: 0.4 mg/dL (ref 0.3–1.2)
Total Protein: 7.4 g/dL (ref 6.0–8.3)

## 2012-01-27 LAB — URINE MICROSCOPIC-ADD ON

## 2012-01-27 MED ORDER — OXYCODONE-ACETAMINOPHEN 5-325 MG PO TABS: 1.0000 | ORAL_TABLET | Freq: Four times a day (QID) | ORAL | Status: AC | PRN

## 2012-01-27 MED ORDER — IOHEXOL 350 MG/ML SOLN
100.0000 mL | Freq: Once | INTRAVENOUS | Status: AC | PRN
  Administered 2012-01-27: 100 mL via INTRAVENOUS

## 2012-01-27 MED ORDER — SODIUM CHLORIDE 0.9 % IV BOLUS (SEPSIS)
1000.0000 mL | Freq: Once | INTRAVENOUS | Status: AC
  Administered 2012-01-27: 1000 mL via INTRAVENOUS

## 2012-01-27 MED ORDER — ONDANSETRON HCL 4 MG/2ML IJ SOLN
4.0000 mg | Freq: Once | INTRAMUSCULAR | Status: AC
  Administered 2012-01-27: 4 mg via INTRAVENOUS
  Filled 2012-01-27: qty 2

## 2012-01-27 MED ORDER — MORPHINE SULFATE 4 MG/ML IJ SOLN
6.0000 mg | Freq: Once | INTRAMUSCULAR | Status: AC
  Administered 2012-01-27: 6 mg via INTRAVENOUS
  Filled 2012-01-27 (×2): qty 1

## 2012-01-27 MED ORDER — MORPHINE SULFATE 4 MG/ML IJ SOLN
6.0000 mg | Freq: Once | INTRAMUSCULAR | Status: AC
  Administered 2012-01-27: 6 mg via INTRAVENOUS
  Filled 2012-01-27: qty 2

## 2012-01-27 NOTE — ED Provider Notes (Signed)
History     CSN: [id number]  Arrival date & time 01/26/12  2155   First MD Initiated Contact with Patient 01/26/12 2345      Chief Complaint  Patient presents with  . Rectal Bleeding    (Consider location/radiation/quality/duration/timing/severity/associated sxs/prior treatment) HPI Patient presents emergency department with intermittent rectal bleeding over the last several months.  Patient, states that has been more close to 8 months, but it seemed to worsen over the last 2 months.  Patient, states that is not constant, but does happened intermittently.  Patient, states he is also having lower abdominal pain, over about the last year.  Patient denies, nausea, vomiting, diarrhea, weakness, chest pain, shortness of breath, headache, fever ,visual changes, or dizziness.  Patient denies taking any medication prior to arrival.  Past Medical History  Diagnosis Date  . Prostate ca     History reviewed. No pertinent past surgical history.  History reviewed. No pertinent family history.  History  Substance Use Topics  . Smoking status: Not on file  . Smokeless tobacco: Not on file  . Alcohol Use:       Review of Systems All other systems negative except as documented in the HPI. All pertinent positives and negatives as reviewed in the HPI.  Allergies  Penicillins and Sudafed  Home Medications   Current Outpatient Rx  Name Route Sig Dispense Refill  . VITAMIN D3 1000 UNITS PO CAPS Oral Take by mouth.    . CLONIDINE HCL 0.3 MG PO TABS Oral Take 0.3 mg by mouth 2 (two) times daily.    Marland Kitchen HYDROCHLOROTHIAZIDE 12.5 MG PO CAPS Oral Take 12.5 mg by mouth daily.    Marland Kitchen OMEPRAZOLE 20 MG PO CPDR Oral Take 20 mg by mouth daily.    Marland Kitchen TAMSULOSIN HCL 0.4 MG PO CAPS Oral Take by mouth.    . TRAZODONE HCL 50 MG PO TABS Oral Take 50 mg by mouth at bedtime.      BP 184/90  Pulse 79  Temp 98.9 F (37.2 C)  Resp 20  SpO2 100%  Physical Exam  Nursing note and vitals  reviewed. Constitutional: He is oriented to person, place, and time. He appears well-developed and well-nourished. No distress.  HENT:  Head: Normocephalic and atraumatic.  Mouth/Throat: Oropharynx is clear and moist.  Eyes: Pupils are equal, round, and reactive to light.  Cardiovascular: Normal rate and regular rhythm.  Exam reveals no friction rub.   Murmur heard.      Patient states that he has a leaking in his heart  Pulmonary/Chest: Effort normal and breath sounds normal. No respiratory distress. He has no wheezes.  Abdominal: Soft. Bowel sounds are normal. He exhibits no distension.    Neurological: He is alert and oriented to person, place, and time.  Skin: Skin is warm and dry. No rash noted.    ED Course  Procedures (including critical care time)  Labs Reviewed  CBC WITH DIFFERENTIAL - Abnormal; Notable for the following:    Hemoglobin 11.9 (*)     HCT 38.7 (*)     MCH 24.6 (*)     RDW 17.2 (*)     All other components within normal limits  COMPREHENSIVE METABOLIC PANEL - Abnormal; Notable for the following:    Potassium 3.4 (*)     Alkaline Phosphatase 119 (*)     GFR calc non Af Amer 74 (*)     GFR calc Af Amer 86 (*)     All other components  within normal limits  URINALYSIS, ROUTINE W REFLEX MICROSCOPIC - Abnormal; Notable for the following:    Color, Urine AMBER (*)  BIOCHEMICALS MAY BE AFFECTED BY COLOR   Specific Gravity, Urine 1.036 (*)     Hgb urine dipstick TRACE (*)     Bilirubin Urine SMALL (*)     Ketones, ur TRACE (*)     Protein, ur 30 (*)     Leukocytes, UA SMALL (*)     All other components within normal limits  URINE MICROSCOPIC-ADD ON - Abnormal; Notable for the following:    Squamous Epithelial / LPF FEW (*)     Casts HYALINE CASTS (*)     All other components within normal limits   Ct Abdomen Pelvis W Contrast  01/27/2012  *RADIOLOGY REPORT*  Clinical Data: Intermittent rectal bleeding; lower abdominal pain. Microhematuria.  CT ABDOMEN AND  PELVIS WITH CONTRAST  Technique:  Multidetector CT imaging of the abdomen and pelvis was performed following the standard protocol during bolus administration of intravenous contrast.  Contrast: OMNIPAQUE IOHEXOL 350 MG/ML SOLN  Comparison: CT of the abdomen and pelvis performed 10/28/2008  Findings: Mild bibasilar atelectasis is noted.  Scattered coronary artery calcifications are seen.  There is a vague nonspecific 1.4 cm focus of increased attenuation within the right hepatic dome; this is not assessed on delayed images.  Though it may be benign, given the patient's history of malignancy, metastatic disease cannot be entirely excluded.  The liver is otherwise unremarkable in appearance.  The spleen is within normal limits.  The gallbladder is grossly unremarkable.  The pancreas and adrenal glands are within normal limits.  Scattered hypodensities are noted arising from both kidneys, more prominent on the left, measuring up to 1.2 cm in size and compatible with small cysts.  The kidneys are otherwise unremarkable in appearance.  There is no evidence of hydronephrosis.  No renal or ureteral stones are seen.  No perinephric stranding is appreciated.  No free fluid is identified.  The small bowel is unremarkable in appearance.  A moderate hiatal hernia is noted; the stomach is otherwise unremarkable.  No acute vascular abnormalities are seen. Scattered calcification is noted along the distal abdominal aorta and its branches.  The appendix is not definitely seen; there is no evidence of appendicitis.  A single diverticulum is noted at the proximal sigmoid colon; the colon is otherwise grossly unremarkable.  The bladder is mildly distended and grossly unremarkable in appearance.  The prostate remains normal in size.  No inguinal lymphadenopathy is seen.  No acute osseous abnormalities are identified.  Vacuum phenomenon and disc space narrowing are noted at L5-S1.  Mild facet disease is noted at the lower lumbar  spine.  IMPRESSION:  1.  No acute abnormalities seen within the abdomen or pelvis. 2.  Moderate hiatal hernia noted. 3.  Vague nonspecific 1.4 cm focus of increased attenuation at the right hepatic dome; though this may be benign, it was not seen on the prior studies and metastatic disease cannot be entirely excluded.  Dynamic liver protocol MRI or CT would be helpful for further evaluation, when and as deemed clinically appropriate. 4.  Scattered coronary artery calcifications seen. 5.  Scattered renal cysts, more prominent on the left. 6.  Single diverticulum noted at the proximal sigmoid colon; colon otherwise unremarkable in appearance. 7.  Mild bibasilar atelectasis noted. 8.  Scattered calcification along the distal abdominal aorta and its branches.  Original Report Authenticated By: Tonia Ghent, M.D.  Patient is stable with his laboratory testing and the CT scan results were reviewed.  Patient, states that he has not followed up with his primary Dr. on these issues.  Patient's vital signs remained stable and the patient is, in no acute distress patient is asking for pain medication for at home.  Patient is, advised to return here for any worsening in his condition.     MDM  MDM Reviewed: nursing note and vitals Interpretation: labs and CT scan            Carlyle Dolly, PA-C 01/27/12 0404

## 2012-01-28 NOTE — ED Provider Notes (Signed)
Medical screening examination/treatment/procedure(s) were performed by non-physician practitioner and as supervising physician I was immediately available for consultation/collaboration.  Maree Ainley, MD 01/28/12 0004 

## 2012-05-01 ENCOUNTER — Emergency Department (HOSPITAL_COMMUNITY)
Admission: EM | Admit: 2012-05-01 | Discharge: 2012-05-02 | Disposition: A | Discharge status: Home / Self Care | Payer: Medicare Other | Attending: Emergency Medicine | Admitting: Emergency Medicine

## 2012-05-01 ENCOUNTER — Emergency Department (HOSPITAL_COMMUNITY): Payer: Medicare Other

## 2012-05-01 ENCOUNTER — Emergency Department (HOSPITAL_COMMUNITY)
Admission: EM | Admit: 2012-05-01 | Discharge: 2012-05-01 | Disposition: A | Discharge status: Nursing Home (Includes ICF) | Payer: Medicare Other | Source: Home / Self Care

## 2012-05-01 ENCOUNTER — Encounter (HOSPITAL_COMMUNITY): Payer: Self-pay | Admitting: *Deleted

## 2012-05-01 DIAGNOSIS — R51 Headache: Secondary | ICD-10-CM | POA: Insufficient documentation

## 2012-05-01 DIAGNOSIS — H539 Unspecified visual disturbance: Secondary | ICD-10-CM | POA: Insufficient documentation

## 2012-05-01 DIAGNOSIS — M47812 Spondylosis without myelopathy or radiculopathy, cervical region: Secondary | ICD-10-CM | POA: Insufficient documentation

## 2012-05-01 DIAGNOSIS — I10 Essential (primary) hypertension: Secondary | ICD-10-CM | POA: Insufficient documentation

## 2012-05-01 DIAGNOSIS — Z79899 Other long term (current) drug therapy: Secondary | ICD-10-CM | POA: Insufficient documentation

## 2012-05-01 HISTORY — DX: Essential (primary) hypertension: I10

## 2012-05-01 LAB — CBC WITH DIFFERENTIAL/PLATELET
Basophils Absolute: 0 10*3/uL (ref 0.0–0.1)
Basophils Relative: 0 % (ref 0–1)
Eosinophils Absolute: 0 10*3/uL (ref 0.0–0.7)
Eosinophils Relative: 1 % (ref 0–5)
HCT: 39.2 % (ref 39.0–52.0)
Hemoglobin: 12.6 g/dL — ABNORMAL LOW (ref 13.0–17.0)
Lymphocytes Relative: 22 % (ref 12–46)
Lymphs Abs: 1.4 10*3/uL (ref 0.7–4.0)
MCH: 25.9 pg — ABNORMAL LOW (ref 26.0–34.0)
MCHC: 32.1 g/dL (ref 30.0–36.0)
MCV: 80.5 fL (ref 78.0–100.0)
Monocytes Absolute: 0.7 10*3/uL (ref 0.1–1.0)
Monocytes Relative: 11 % (ref 3–12)
Neutro Abs: 4.4 10*3/uL (ref 1.7–7.7)
Neutrophils Relative %: 67 % (ref 43–77)
Platelets: 248 10*3/uL (ref 150–400)
RBC: 4.87 MIL/uL (ref 4.22–5.81)
RDW: 15.5 % (ref 11.5–15.5)
WBC: 6.6 10*3/uL (ref 4.0–10.5)

## 2012-05-01 LAB — BASIC METABOLIC PANEL
BUN: 8 mg/dL (ref 6–23)
CO2: 24 mEq/L (ref 19–32)
Calcium: 9.1 mg/dL (ref 8.4–10.5)
Chloride: 105 mEq/L (ref 96–112)
Creatinine, Ser: 0.74 mg/dL (ref 0.50–1.35)
GFR calc Af Amer: >90 mL/min (ref >90)
GFR calc non Af Amer: >90 mL/min (ref >90)
Glucose, Bld: 101 mg/dL — ABNORMAL HIGH (ref 70–99)
Potassium: 3.7 mEq/L (ref 3.5–5.1)
Sodium: 139 mEq/L (ref 135–145)

## 2012-05-01 MED ORDER — ONDANSETRON HCL 4 MG/2ML IJ SOLN
4.0000 mg | Freq: Once | INTRAMUSCULAR | Status: AC
  Administered 2012-05-01: 4 mg via INTRAVENOUS
  Filled 2012-05-01: qty 2

## 2012-05-01 MED ORDER — CLONIDINE HCL 0.2 MG PO TABS
0.2000 mg | ORAL_TABLET | Freq: Once | ORAL | Status: AC
  Administered 2012-05-01: 0.2 mg via ORAL
  Filled 2012-05-01: qty 1

## 2012-05-01 MED ORDER — HYDROMORPHONE HCL PF 1 MG/ML IJ SOLN
1.0000 mg | INTRAMUSCULAR | Status: AC
  Administered 2012-05-01: 1 mg via INTRAVENOUS
  Filled 2012-05-01: qty 1

## 2012-05-01 MED ORDER — CLONIDINE HCL 0.1 MG PO TABS
0.1000 mg | ORAL_TABLET | Freq: Once | ORAL | Status: AC
  Administered 2012-05-01: 0.1 mg via ORAL
  Filled 2012-05-01: qty 1

## 2012-05-01 NOTE — ED Notes (Signed)
Dr.ferry informed of bp - manual 242/100 both arms

## 2012-05-01 NOTE — ED Notes (Signed)
Pt in via [facility] from [facility] c/o HA & Nausea, pt hypertensive, pt received 1L [place] prior to arrival, Pt A&O x4, follows commands, speaks in complete sentences

## 2012-05-01 NOTE — ED Notes (Signed)
Pt states that he woke this morning with neck pain - unable to move neck causing head to hurt - denies strain or injury - never has occurred before

## 2012-05-01 NOTE — ED Provider Notes (Signed)
History     CSN: [id number]  Arrival date & time 05/01/12  2051   First MD Initiated Contact with Patient 05/01/12 2123      Chief Complaint  Patient presents with  . Hypertension    (Consider location/radiation/quality/duration/timing/severity/associated sxs/prior treatment) HPI Comments: Patient reports he woke up this morning with soreness across his head that felt like a "pulled muscle."  Pain gradually got worse.  He had one episode of N/V.  Pain is located from his forehead to his occiput with pain in his neck.  Pain is worse with movement, coughing, attempting to move neck in any direction.  Pain is described as throbbing.  Denies fevers, trauma.  Has never had headache like this before.  Pain is "200" out of 10 in intensity.    Has had some blurry vision.  Denies any weakness or numbness in his extremities.  Denies CP, SOB, abdominal pain, N/V/D.  Pt reports he has not taken his blood pressure medication for the past two days.   Pt sent from urgent care.  Note note available at time that I saw patient.    Patient is a 61 y.o. male presenting with hypertension. The history is provided by the patient.  Hypertension Associated symptoms include headaches and neck pain. Pertinent negatives include no abdominal pain, chest pain, chills, fever, nausea, numbness, vomiting or weakness.    Past Medical History  Diagnosis Date  . Prostate ca   . Hypertension     No past surgical history on file.  Family History  Problem Relation Age of Onset  . Family history unknown: Yes    History  Substance Use Topics  . Smoking status: Current Every Day Smoker -- 1.0 packs/day    Types: Cigarettes  . Smokeless tobacco: Not on file  . Alcohol Use: 2.4 oz/week    4 Cans of beer per week     40 oz daily      Review of Systems  Constitutional: Negative for fever and chills.  HENT: Positive for neck pain.   Eyes: Positive for visual disturbance.  Respiratory: Negative for shortness  of breath.   Cardiovascular: Negative for chest pain.  Gastrointestinal: Negative for nausea, vomiting, abdominal pain and diarrhea.  Neurological: Positive for headaches. Negative for weakness and numbness.  Psychiatric/Behavioral: Negative for confusion.    Allergies  Penicillins and Sudafed  Home Medications   Current Outpatient Rx  Name Route Sig Dispense Refill  . VITAMIN D3 1000 UNITS PO CAPS Oral Take by mouth.    . CLONIDINE HCL 0.3 MG PO TABS Oral Take 0.3 mg by mouth 2 (two) times daily.    Marland Kitchen HYDROCHLOROTHIAZIDE 12.5 MG PO CAPS Oral Take 12.5 mg by mouth daily.    Marland Kitchen OMEPRAZOLE 20 MG PO CPDR Oral Take 20 mg by mouth daily.    Marland Kitchen TAMSULOSIN HCL 0.4 MG PO CAPS Oral Take by mouth.    . TRAZODONE HCL 50 MG PO TABS Oral Take 50 mg by mouth at bedtime.      BP 240/94  Pulse 62  Temp 97.5 F (36.4 C) (Oral)  Resp 20  SpO2 99%  Physical Exam  Nursing note and vitals reviewed. Constitutional: He appears well-developed and well-nourished. No distress.  HENT:  Head: Normocephalic and atraumatic.  Neck:       Pt will not move neck in any direction  Cardiovascular: Normal rate and regular rhythm.   Pulmonary/Chest: Effort normal and breath sounds normal. No respiratory distress. He has no  wheezes. He has no rales.  Abdominal: Soft. He exhibits no distension and no mass. There is no tenderness. There is no rebound and no guarding.  Neurological: He is alert. He has normal strength. No cranial nerve deficit or sensory deficit. He exhibits normal muscle tone. GCS eye subscore is 4. GCS verbal subscore is 5. GCS motor subscore is 6.       CN II-XII intact, EOMs intact, no pronator drift, grip strengths equal bilaterally; strength 5/5 in all extremities, sensation intact in all extremities.   Skin: He is not diaphoretic.    ED Course  Procedures (including critical care time)  Labs Reviewed  CBC WITH DIFFERENTIAL - Abnormal; Notable for the following:    Hemoglobin 12.6 (*)       MCH 25.9 (*)     All other components within normal limits  BASIC METABOLIC PANEL - Abnormal; Notable for the following:    Glucose, Bld 101 (*)     All other components within normal limits   Dg Cervical Spine Complete  05/02/2012  *RADIOLOGY REPORT*  Clinical Data: Bilateral neck pain without trauma.  CERVICAL SPINE - COMPLETE 4+ VIEW  Comparison: None.  Findings: The lateral view images through bottom of C6. Prevertebral soft tissues are within normal limits.  Straightening of the expected cervical lordosis.  Maintenance of vertebral body height.  Advanced C4-C6 spondylosis with endplate osteophytes. Right-sided neural foraminal narrowing at C5-C6 secondary to uncovertebral joint hypertrophy.  The left neural foramen are poorly evaluated on the contralateral oblique image.  Lateral masses symmetric.  Tip of odontoid process partially obscured.  Endplate osteophytes also involve C6-C7.  Attempted swimmer's view is nondiagnostic.  IMPRESSION: Lower lumbar spondylosis, without acute finding in the cervical spine.  Suboptimal evaluation of C7-T1 and the tip of the odontoid process.   Original Report Authenticated By: Consuello Bossier, M.D.    Ct Head Wo Contrast  05/01/2012  *RADIOLOGY REPORT*  Clinical Data: Neck pain.  Hypertension and headache.  CT HEAD WITHOUT CONTRAST  Technique:  Contiguous axial images were obtained from the base of the skull through the vertex without contrast.  Comparison: Report of 01/20/2001.  Prior images not available.  Findings: Bone windows demonstrate aerated petrous apices. Paranasal sinuses otherwise within normal limits.  Soft tissue windows demonstrate mild cerebral atrophy.  Cerebellar atrophy as well. No  mass lesion, hemorrhage, hydrocephalus, acute infarct, intra-axial, or extra-axial fluid collection.  IMPRESSION:  1. No acute intracranial abnormality. 2.  Cerebral and cerebellar atrophy.   Original Report Authenticated By: Consuello Bossier, M.D.     11:10 PM  Patient reports headache went from 10/10 to 8/10.  BP 206/91.  Discussed patient with Dr Effie Shy who will also see and examine patient.    Filed Vitals:   05/02/12 0017  BP: 198/89  Pulse: 57  Temp:   Resp: 14   1:13 AM Patient is sleeping soundly, snoring.  Unable to wake up at this time.  Pt remains on monitor.  Likely from ativan.  Significant other states that patient stopped taking his medication recently because he was having itching all over from something and the [place] did not change his medication so he just stopped taking everything.  Discussed patient again with Dr Effie Shy who agrees with discharge at this time.     1. Headache   2. Hypertension   3. Arthritis of neck       MDM  Afebrile, nontoxic patient with gradual onset of headache since this morning,  now throughout head and into neck.  Pt also hypertensive, has been off his catapres x 2 days.  Pt given home dose of catapres with improvement.  Head CT is negative, c-spine xray without acute finding.  Neurologically intact.  Headache was gradual onset, doubt bleed.  Pt afebrile, nontoxic, normal WBC - doubt meningitis.  Pt not moving neck but very likely due to muscle spasm pain.  Pt appears to have great relief as he is sleeping soundly.   Dr Effie Shy also saw and examined patient.  Labs normal with exception of mild elevation of glucose (101) and mild anemia (Hgb 12.6).  C-spine xray shows arthritis and likely muscle spasm. BP has comes down to 183/88.  Pt strongly advised to continue taking his BP medication at home and follow up with his primary care provider [facility]).  Discussed all results with patient.  Pt given return precautions.          [place], [place] 05/02/12 [phone]

## 2012-05-01 NOTE — ED Notes (Signed)
[facility] not available, [facility] called for transport - dr. Thad Ranger in with pt. Iv attempt in progress

## 2012-05-01 NOTE — ED Notes (Signed)
o2 applied, 18g iv in right fa established

## 2012-05-01 NOTE — ED Provider Notes (Signed)
History of Present Illness   Patient Identification [name] is a 61 y.o. male.  Patient information was obtained from patient. History/Exam limitations: poor historian, much of care at [facility] with no records avaialable. Patient presented to the Emergency Department by private car  Chief Complaint  Neck Pain  Patient is a 61 y.o. african-american male who presents for evaluation of severe neck pain and headache who is found to have BP 242/100 on arrival. He ran out of BP medications a few days ago. His usual home blood pressures are in the 170-180s. He cannot move his neck due to pain/stiffness. He denies chest pain, shortness of breath, dizziness, or focal weakness or numbness. But he feels generally very weak.   Past Medical History  Diagnosis Date  . Prostate ca   . Hypertension   *  Heart valve leak diagnosed recently *  Gout  Past Surgical Hsitory:   Bleeding ulcer, appendix, abdominal surgery x 12 in [place]   Family History  Problem Relation Age of Onset  . Family history unknown: Yes   No current facility-administered medications for this encounter.   Current Outpatient Prescriptions  Medication Sig Dispense Refill  . Cholecalciferol (VITAMIN D3) 1000 UNITS CAPS Take by mouth.      . cloNIDine (CATAPRES) 0.3 MG tablet Take 0.3 mg by mouth 2 (two) times daily.      . hydrochlorothiazide (MICROZIDE) 12.5 MG capsule Take 12.5 mg by mouth daily.      Marland Kitchen omeprazole (PRILOSEC) 20 MG capsule Take 20 mg by mouth daily.      . Tamsulosin HCl (FLOMAX) 0.4 MG CAPS Take by mouth.      . traZODone (DESYREL) 50 MG tablet Take 50 mg by mouth at bedtime.       Allergies  Allergen Reactions  . Penicillins Anaphylaxis  . Sudafed (Pseudoephedrine) Anaphylaxis   History   Social History  . Marital Status: Married    Spouse Name: N/A    Number of Children: N/A  . Years of Education: N/A   Occupational History  . Not on file.   Social History Main Topics  . Smoking status:  Current Every Day Smoker -- 1.0 packs/day    Types: Cigarettes  . Smokeless tobacco: Not on file  . Alcohol Use: 2.4 oz/week    4 Cans of beer per week     40 oz daily  . Drug Use: 5 per week    Special: Marijuana  . Sexually Active:    Other Topics Concern  . Not on file   Social History Narrative  . No narrative on file   Review of Systems:  See HPI.   Physical Exam   BP 242/100  Pulse 61  Temp 98.2 F (36.8 C) (Oral)  Resp 18  SpO2 100% Gen: WNWD in no acute distress - looks tired/fatigued HEENT:  NCAT, EOMI, conjunctiva normal. Unable to get good fundal exam. Neck:  No JVD, no bruits CV:  Distant heart sounds. Regular rate and rhythm. Lungs:  CTAB Extrem:  No edema Neuro:  Strength 5/5 and equal bilateral lower and upper extremities. No focal deficits, no speech changes.  ED Course   Studies: None  Records Reviewed: Prior medical records  Treatments: None  Consultations: None  Disposition: Transferred to [facility] by [facility].  Napoleon Form, MD 05/01/12 2240

## 2012-05-01 NOTE — ED Notes (Signed)
Report called to [facility] in ed. Care turned over to [facility]

## 2012-05-02 ENCOUNTER — Emergency Department (HOSPITAL_COMMUNITY): Payer: Medicare Other

## 2012-05-02 MED ORDER — PREDNISONE (PAK) 10 MG PO TABS: 10.0000 mg | ORAL_TABLET | Freq: Every day | ORAL | Status: DC

## 2012-05-02 MED ORDER — LORAZEPAM 2 MG/ML IJ SOLN
1.0000 mg | Freq: Once | INTRAMUSCULAR | Status: AC
  Administered 2012-05-02: 1 mg via INTRAVENOUS
  Filled 2012-05-02: qty 1

## 2012-05-02 MED ORDER — KETOROLAC TROMETHAMINE 30 MG/ML IJ SOLN
30.0000 mg | Freq: Once | INTRAMUSCULAR | Status: AC
  Administered 2012-05-02: 30 mg via INTRAVENOUS
  Filled 2012-05-02: qty 1

## 2012-05-02 MED ORDER — HYDROCODONE-ACETAMINOPHEN 5-325 MG PO TABS: 1.0000 | ORAL_TABLET | ORAL | Status: DC | PRN

## 2012-05-02 MED ORDER — DIAZEPAM 5 MG PO TABS: 5.0000 mg | ORAL_TABLET | Freq: Three times a day (TID) | ORAL | Status: DC | PRN

## 2012-05-02 NOTE — ED Notes (Signed)
PT. MORE ALERT , [facility] CALLED FOR PT. DAUGHTER WITH PT.

## 2012-05-02 NOTE — ED Provider Notes (Signed)
[name] is a 61 y.o. male who awoke yesterday morning with neck pain. He did not take his clonidine yesterday or today. The pain has been persistent. It is worse when he tries to stand up. He denies injury to the neck. There's been no fever, chills, nausea, or vomiting.  Exam reveals alert, cooperative man in moderate distress. Neck is tender to touch with bilateral spasm of the paracervical musculature. He has diminished range of motion secondary to neck pain. Neurologic is grossly nonfocal.   Toradol, and Ativan ordered. He was previously treated with hydromorphone and Zofran     Medical screening examination/treatment/procedure(s) were conducted as a shared visit with non-physician practitioner(s) and myself.  I personally evaluated the patient during the encounter  Jonathon Melter, MD 05/04/12 [phone]

## 2012-05-02 NOTE — ED Notes (Signed)
PT. NOT READY FOR DISCHARGE , PT. IS STILL SOMNOLENT AFTER RECEIVING ATIVAN.

## 2013-01-10 ENCOUNTER — Encounter (HOSPITAL_BASED_OUTPATIENT_CLINIC_OR_DEPARTMENT_OTHER): Payer: Self-pay | Admitting: Emergency Medicine

## 2013-01-10 ENCOUNTER — Emergency Department (HOSPITAL_BASED_OUTPATIENT_CLINIC_OR_DEPARTMENT_OTHER)
Admission: EM | Admit: 2013-01-10 | Discharge: 2013-01-10 | Disposition: A | Discharge status: Home / Self Care | Payer: Medicare Other | Attending: Emergency Medicine | Admitting: Emergency Medicine

## 2013-01-10 DIAGNOSIS — IMO0002 Reserved for concepts with insufficient information to code with codable children: Secondary | ICD-10-CM | POA: Insufficient documentation

## 2013-01-10 DIAGNOSIS — I10 Essential (primary) hypertension: Secondary | ICD-10-CM

## 2013-01-10 DIAGNOSIS — R42 Dizziness and giddiness: Secondary | ICD-10-CM | POA: Insufficient documentation

## 2013-01-10 DIAGNOSIS — R51 Headache: Secondary | ICD-10-CM | POA: Insufficient documentation

## 2013-01-10 DIAGNOSIS — Z880 Allergy status to penicillin: Secondary | ICD-10-CM | POA: Insufficient documentation

## 2013-01-10 DIAGNOSIS — Z8546 Personal history of malignant neoplasm of prostate: Secondary | ICD-10-CM | POA: Insufficient documentation

## 2013-01-10 DIAGNOSIS — F17200 Nicotine dependence, unspecified, uncomplicated: Secondary | ICD-10-CM | POA: Insufficient documentation

## 2013-01-10 DIAGNOSIS — Z79899 Other long term (current) drug therapy: Secondary | ICD-10-CM | POA: Insufficient documentation

## 2013-01-10 MED ORDER — CLONIDINE HCL 0.3 MG PO TABS: 0.3000 mg | ORAL_TABLET | Freq: Two times a day (BID) | ORAL | Status: DC

## 2013-01-10 NOTE — ED Provider Notes (Signed)
History    CSN: [id number] Arrival date & time 01/10/13  2005  First MD Initiated Contact with Patient 01/10/13 2141     Chief Complaint  Patient presents with  . Hypertension   (Consider location/radiation/quality/duration/timing/severity/associated sxs/prior Treatment) HPI Pt states up until recently he has been taking clonidine and lisinopril/hctz for his blood pressure. Pt states he has been unable tot get his clonidine at [facility]. BP has been elevated. C/o  Episodic lightheadedness and HA. No chest pain, vision changes or focal weakness. Pt currently is asymptomatic.  Past Medical History  Diagnosis Date  . Prostate ca   . Hypertension    History reviewed. No pertinent past surgical history. No family history on file. History  Substance Use Topics  . Smoking status: Current Every Day Smoker -- 1.00 packs/day    Types: Cigarettes  . Smokeless tobacco: Not on file  . Alcohol Use: 2.4 oz/week    4 Cans of beer per week     Comment: 40 oz daily    Review of Systems  Constitutional: Negative for fever and chills.  HENT: Negative for neck pain.   Eyes: Negative for visual disturbance.  Respiratory: Negative for cough and shortness of breath.   Cardiovascular: Negative for chest pain.  Gastrointestinal: Negative for nausea, vomiting and abdominal pain.  Musculoskeletal: Negative for myalgias and back pain.  Skin: Negative for rash and wound.  Neurological: Positive for dizziness, light-headedness and headaches. Negative for syncope, weakness and numbness.  All other systems reviewed and are negative.    Allergies  Penicillins and Sudafed  Home Medications   Current Outpatient Rx  Name  Route  Sig  Dispense  Refill  . lisinopril-hydrochlorothiazide (PRINZIDE,ZESTORETIC) 20-12.5 MG per tablet   Oral   Take 1 tablet by mouth daily.         . Cholecalciferol (VITAMIN D3) 1000 UNITS CAPS   Oral   Take by mouth.         . cloNIDine (CATAPRES) 0.3 MG tablet    Oral   Take 0.3 mg by mouth 2 (two) times daily.         . diazepam (VALIUM) 5 MG tablet   Oral   Take 1 tablet (5 mg total) by mouth every 8 (eight) hours as needed (muscle spasm or pain ).   10 tablet   0   . hydrochlorothiazide (MICROZIDE) 12.5 MG capsule   Oral   Take 12.5 mg by mouth daily.         Marland Kitchen HYDROcodone-acetaminophen (NORCO/VICODIN) 5-325 MG per tablet   Oral   Take 1 tablet by mouth every 4 (four) hours as needed for pain.   10 tablet   0   . omeprazole (PRILOSEC) 20 MG capsule   Oral   Take 20 mg by mouth daily.         . predniSONE (STERAPRED UNI-PAK) 10 MG tablet   Oral   Take 1 tablet (10 mg total) by mouth daily. Day 1: take 6 tabs.  Day 2: 5 tabs  Day 3: 4 tabs  Day 4: 3 tabs  Day 5: 2 tabs  Day 6: 1 tab   21 tablet   0   . Tamsulosin HCl (FLOMAX) 0.4 MG CAPS   Oral   Take by mouth.         . traZODone (DESYREL) 50 MG tablet   Oral   Take 50 mg by mouth at bedtime.  BP 195/85  Pulse 70  Temp(Src) 98.1 F (36.7 C) (Oral)  Resp 20  Ht 5' 11.5" (1.816 m)  Wt 183 lb (83.008 kg)  BMI 25.17 kg/m2  SpO2 98% Physical Exam  Nursing note and vitals reviewed. Constitutional: He is oriented to person, place, and time. He appears well-developed and well-nourished. No distress.  HENT:  Head: Normocephalic and atraumatic.  Mouth/Throat: Oropharynx is clear and moist.  Eyes: EOM are normal. Pupils are equal, round, and reactive to light.  Neck: Normal range of motion. Neck supple.  Cardiovascular: Normal rate and regular rhythm.   Pulmonary/Chest: Effort normal and breath sounds normal. No respiratory distress. He has no wheezes. He has no rales.  Abdominal: Soft. Bowel sounds are normal. He exhibits no distension and no mass. There is no tenderness. There is no rebound and no guarding.  Musculoskeletal: Normal range of motion. He exhibits no edema and no tenderness.  Neurological: He is alert and oriented to person, place, and time.   5/5 motor in all ext, sensation intact.   Skin: Skin is warm and dry. No rash noted. No erythema.  Psychiatric: He has a normal mood and affect. His behavior is normal.    ED Course  Procedures (including critical care time) Labs Reviewed - No data to display No results found. 1. Uncontrolled hypertension     MDM  Pt brother arrived to ED with pt prescription for clonidine. No longer needs refill. Return precautions given.  Loren Racer, MD 01/10/13 2233

## 2013-01-10 NOTE — ED Notes (Signed)
Pt returned to lobby after discharge with [employer] representative and stated that the [employer] representative would not accept his properly labeled bottle of clonidine. [employer] rep stated to me that he had to have a written prescription. Rx for clonidine obtained from Dr. Ranae Palms.

## 2013-01-10 NOTE — ED Notes (Signed)
Pt is a patient at [facility]. Pt states he also supposed to be taking clonidine but has not been receiving this medication at [facility]. Pt states he is only receiving the lisinopril/hctz there.

## 2013-01-10 NOTE — ED Notes (Signed)
Pt c/o elevated blood pressure. Pt states he has been taking hypertension medication as directed.

## 2013-01-13 ENCOUNTER — Emergency Department (HOSPITAL_BASED_OUTPATIENT_CLINIC_OR_DEPARTMENT_OTHER)
Admission: EM | Admit: 2013-01-13 | Discharge: 2013-01-13 | Disposition: A | Discharge status: Home / Self Care | Payer: Medicare Other | Attending: Emergency Medicine | Admitting: Emergency Medicine

## 2013-01-13 ENCOUNTER — Encounter (HOSPITAL_BASED_OUTPATIENT_CLINIC_OR_DEPARTMENT_OTHER): Payer: Self-pay | Admitting: *Deleted

## 2013-01-13 ENCOUNTER — Emergency Department (HOSPITAL_BASED_OUTPATIENT_CLINIC_OR_DEPARTMENT_OTHER): Payer: Medicare Other

## 2013-01-13 DIAGNOSIS — IMO0002 Reserved for concepts with insufficient information to code with codable children: Secondary | ICD-10-CM | POA: Insufficient documentation

## 2013-01-13 DIAGNOSIS — K4090 Unilateral inguinal hernia, without obstruction or gangrene, not specified as recurrent: Secondary | ICD-10-CM | POA: Insufficient documentation

## 2013-01-13 DIAGNOSIS — Z79899 Other long term (current) drug therapy: Secondary | ICD-10-CM | POA: Insufficient documentation

## 2013-01-13 DIAGNOSIS — Z880 Allergy status to penicillin: Secondary | ICD-10-CM | POA: Insufficient documentation

## 2013-01-13 DIAGNOSIS — Z8546 Personal history of malignant neoplasm of prostate: Secondary | ICD-10-CM | POA: Insufficient documentation

## 2013-01-13 DIAGNOSIS — I10 Essential (primary) hypertension: Secondary | ICD-10-CM | POA: Insufficient documentation

## 2013-01-13 DIAGNOSIS — R197 Diarrhea, unspecified: Secondary | ICD-10-CM | POA: Insufficient documentation

## 2013-01-13 DIAGNOSIS — Z8719 Personal history of other diseases of the digestive system: Secondary | ICD-10-CM | POA: Insufficient documentation

## 2013-01-13 DIAGNOSIS — F17200 Nicotine dependence, unspecified, uncomplicated: Secondary | ICD-10-CM | POA: Insufficient documentation

## 2013-01-13 DIAGNOSIS — K5900 Constipation, unspecified: Secondary | ICD-10-CM | POA: Insufficient documentation

## 2013-01-13 DIAGNOSIS — G8929 Other chronic pain: Secondary | ICD-10-CM | POA: Insufficient documentation

## 2013-01-13 MED ORDER — OXYCODONE-ACETAMINOPHEN 5-325 MG PO TABS: 1.0000 | ORAL_TABLET | ORAL | Status: DC | PRN

## 2013-01-13 NOTE — ED Notes (Signed)
Pt has hernia that he is unable to reduce- c/o abd pain also

## 2013-01-13 NOTE — ED Notes (Signed)
MD at bedside. 

## 2013-01-13 NOTE — ED Provider Notes (Addendum)
History    This chart was scribed for Jonathon Quarry, MD by Quintella Reichert, ED scribe.  This patient was seen in room MH03/MH03 and the patient's care was started at 8:23 PM.  CSN: [id number]  Arrival date & time 01/13/13  2001    Chief Complaint  Patient presents with  . Abdominal Pain    Patient is a 62 y.o. male presenting with abdominal pain. The history is provided by the patient. No language interpreter was used.  Abdominal Pain This is a new problem. The current episode started more than 2 days ago. The problem occurs constantly. The problem has been gradually worsening. Associated symptoms include abdominal pain. Pertinent negatives include no chest pain, no headaches and no shortness of breath. Nothing aggravates the symptoms. Nothing relieves the symptoms. He has tried nothing for the symptoms.    HPI Comments: [name] is a 62 y.o. male with h/o HTN who presents to the Emergency Department complaining of recent exacerbation of chronic pain related to an inguinal hernia that he had had for several years.   Pt states that he has previously been able to easily reduce the hernia by lying down and pressing it, and he has been told by the [facility] that it does not require treatment.  However for the past 4-5 days he has had more difficulty reducing it and he has experienced associated constant, moderate aching pain to the right groin area.  Pt also reports constant, moderate generalized abdominal pain and nausea.  He denies emesis.  In addition he complains of chronic constipation and laxative-induced diarrhea for the past 4 months.  He states that he ate today but quickly had diarrhea afterwards due to laxative use.  Additional medical history includes prostate cancer.  He denies DM or any other chronic medical conditions.  He is a current one-pack-a-day smoker.  Past Medical History  Diagnosis Date  . Prostate ca   . Hypertension   . Hernia    Past Surgical History   Procedure Laterality Date  . Appendectomy    . Small intestine surgery     No family history on file. History  Substance Use Topics  . Smoking status: Current Every Day Smoker -- 1.00 packs/day    Types: Cigarettes  . Smokeless tobacco: Never Used  . Alcohol Use: 0.0 oz/week     Comment: last drink 2 weeks ago    Review of Systems  Respiratory: Negative for shortness of breath.   Cardiovascular: Negative for chest pain.  Gastrointestinal: Positive for abdominal pain, diarrhea (Secondary to laxative use) and constipation. Negative for vomiting.  Neurological: Negative for headaches.  All other systems reviewed and are negative.    Allergies  Penicillins and Sudafed  Home Medications   Current Outpatient Rx  Name  Route  Sig  Dispense  Refill  . cloNIDine (CATAPRES) 0.3 MG tablet   Oral   Take 1 tablet (0.3 mg total) by mouth 2 (two) times daily.   60 tablet   0   . Cholecalciferol (VITAMIN D3) 1000 UNITS CAPS   Oral   Take by mouth.         . diazepam (VALIUM) 5 MG tablet   Oral   Take 1 tablet (5 mg total) by mouth every 8 (eight) hours as needed (muscle spasm or pain ).   10 tablet   0   . hydrochlorothiazide (MICROZIDE) 12.5 MG capsule   Oral   Take 12.5 mg by mouth daily.         Marland Kitchen  HYDROcodone-acetaminophen (NORCO/VICODIN) 5-325 MG per tablet   Oral   Take 1 tablet by mouth every 4 (four) hours as needed for pain.   10 tablet   0   . lisinopril-hydrochlorothiazide (PRINZIDE,ZESTORETIC) 20-12.5 MG per tablet   Oral   Take 1 tablet by mouth daily.         Marland Kitchen omeprazole (PRILOSEC) 20 MG capsule   Oral   Take 20 mg by mouth daily.         . predniSONE (STERAPRED UNI-PAK) 10 MG tablet   Oral   Take 1 tablet (10 mg total) by mouth daily. Day 1: take 6 tabs.  Day 2: 5 tabs  Day 3: 4 tabs  Day 4: 3 tabs  Day 5: 2 tabs  Day 6: 1 tab   21 tablet   0   . Tamsulosin HCl (FLOMAX) 0.4 MG CAPS   Oral   Take by mouth.         . traZODone  (DESYREL) 50 MG tablet   Oral   Take 50 mg by mouth at bedtime.          BP 155/71  Pulse 60  Temp(Src) 97.7 F (36.5 C) (Oral)  Resp 20  Ht 5\' 11"  (1.803 m)  Wt 183 lb (83.008 kg)  BMI 25.53 kg/m2  SpO2 100%  Physical Exam  Nursing note and vitals reviewed. Constitutional: He is oriented to person, place, and time. He appears well-developed and well-nourished.  HENT:  Head: Normocephalic and atraumatic.  Right Ear: External ear normal.  Left Ear: External ear normal.  Nose: Nose normal.  Mouth/Throat: Oropharynx is clear and moist.  Eyes: Conjunctivae and EOM are normal. Pupils are equal, round, and reactive to light.  Neck: Normal range of motion. Neck supple.  Cardiovascular: Normal rate, regular rhythm, normal heart sounds and intact distal pulses.   Pulmonary/Chest: Effort normal and breath sounds normal. No respiratory distress. He has no wheezes. He exhibits no tenderness.  Abdominal: Soft. Bowel sounds are normal. He exhibits mass. He exhibits no distension. There is tenderness. There is no guarding.  Right inguinal hernia, mild, reduced easily. Abdomen mildly diffusely tender. Midline scar, well-healed.  Musculoskeletal: Normal range of motion.  Neurological: He is alert and oriented to person, place, and time. He has normal reflexes. He exhibits normal muscle tone. Coordination normal.  Skin: Skin is warm and dry.  Psychiatric: He has a normal mood and affect. His behavior is normal. Judgment and thought content normal.    ED Course  Procedures (including critical care time)  DIAGNOSTIC STUDIES: Oxygen Saturation is 100% on room air normal by my interpretation.    COORDINATION OF CARE: 8:29 PM-Discussed treatment plan which includes abdominal x-Taurus Willis to rule out bowel obstruction and f/u with surgeon with pt at bedside and pt agreed to plan.      Labs Reviewed - No data to display  No results found.  No diagnosis found.  MDM  62 y.o. Male with right  inguinal hernia worsening by history.  Palpable and easily reducible here in ed.  Patient advised regarding return precautions.  Patient given referral to general surgeon.    I personally performed the services described in this documentation, which was scribed in my presence. The recorded information has been reviewed and considered.  Jonathon Quarry, MD 01/13/13 2122  Jonathon Quarry, MD 01/13/13 2123

## 2013-12-28 ENCOUNTER — Ambulatory Visit (INDEPENDENT_AMBULATORY_CARE_PROVIDER_SITE_OTHER): Payer: Medicare Other | Admitting: General Surgery

## 2014-01-28 ENCOUNTER — Ambulatory Visit (INDEPENDENT_AMBULATORY_CARE_PROVIDER_SITE_OTHER): Payer: Medicare Other | Admitting: General Surgery

## 2014-01-30 ENCOUNTER — Telehealth (INDEPENDENT_AMBULATORY_CARE_PROVIDER_SITE_OTHER): Payer: Self-pay | Admitting: *Deleted

## 2014-01-30 ENCOUNTER — Ambulatory Visit (INDEPENDENT_AMBULATORY_CARE_PROVIDER_SITE_OTHER): Payer: Medicare Other | Admitting: General Surgery

## 2014-01-30 ENCOUNTER — Encounter (INDEPENDENT_AMBULATORY_CARE_PROVIDER_SITE_OTHER): Payer: Self-pay | Admitting: General Surgery

## 2014-01-30 ENCOUNTER — Other Ambulatory Visit (INDEPENDENT_AMBULATORY_CARE_PROVIDER_SITE_OTHER): Payer: Self-pay | Admitting: *Deleted

## 2014-01-30 ENCOUNTER — Other Ambulatory Visit (INDEPENDENT_AMBULATORY_CARE_PROVIDER_SITE_OTHER): Payer: Self-pay | Admitting: General Surgery

## 2014-01-30 VITALS — BP 140/90 | HR 86 | Temp 98.0°F | Resp 18 | Ht 67.5 in | Wt 216.0 lb

## 2014-01-30 DIAGNOSIS — R51 Headache: Principal | ICD-10-CM

## 2014-01-30 DIAGNOSIS — R519 Headache, unspecified: Secondary | ICD-10-CM

## 2014-01-30 DIAGNOSIS — K4090 Unilateral inguinal hernia, without obstruction or gangrene, not specified as recurrent: Secondary | ICD-10-CM | POA: Insufficient documentation

## 2014-01-30 NOTE — Telephone Encounter (Signed)
Called pt to give him his appt for CT of nose.   Pt wasn't where he could write it down.  I advised him to call the office back and ask for nursing office and there will be a detailed message in the chart.  It is scheduled for 01-31-14 arrive at 3:15 at [facility], [place] [phone].  Advise pt NO solid food 4 hours prior to the test. Anderson Malta

## 2014-01-30 NOTE — Telephone Encounter (Signed)
Informed pt of message below. Pt verbalized understanding  

## 2014-01-30 NOTE — Progress Notes (Signed)
Patient ID: [name], male   DOB: 1950/08/31, 63 y.o.   MRN: [id number]  Chief Complaint  Patient presents with  . New Evaluation    R/H    HPI [name] is a 63 y.o. male.   HPI   He is referred by Dr. Jeanell Sparrow, EDP, for further evaluation and treatment of a painful right inguinal hernia.  He has had this for about a year but it began bothering him recently. He states sometimes the pain radiates down his right inner thigh. No difficulty with constipation. He does have some difficulty with dribbling after urination. Also, he reports he fell and hit his face last Thursday. He reports some swelling in the nasal and chin areas. He has had multiple abdominal surgeries.  Past Medical History  Diagnosis Date  . Hypertension   . Hernia   . Prostate ca     Past Surgical History  Procedure Laterality Date  . Appendectomy    . Small intestine surgery    . Multiple abdominal surgeries      History reviewed. No pertinent family history.  Social History History  Substance Use Topics  . Smoking status: Current Every Day Smoker -- 1.00 packs/day    Types: Cigarettes  . Smokeless tobacco: Never Used  . Alcohol Use: 0.0 oz/week     Comment: last drink 2 weeks ago    Allergies  Allergen Reactions  . Penicillins Anaphylaxis  . Sudafed [Pseudoephedrine] Anaphylaxis    Current Outpatient Prescriptions  Medication Sig Dispense Refill  . Cholecalciferol (VITAMIN D3) 1000 UNITS CAPS Take by mouth.      . cloNIDine (CATAPRES) 0.2 MG tablet Take 0.2 mg by mouth.      . cloNIDine (CATAPRES) 0.3 MG tablet Take 1 tablet (0.3 mg total) by mouth 2 (two) times daily.  60 tablet  0  . diazepam (VALIUM) 5 MG tablet Take 1 tablet (5 mg total) by mouth every 8 (eight) hours as needed (muscle spasm or pain ).  10 tablet  0  . hydrochlorothiazide (MICROZIDE) 12.5 MG capsule Take 12.5 mg by mouth daily.      Marland Kitchen lisinopril (PRINIVIL,ZESTRIL) 10 MG tablet Take 10 mg by mouth.      Marland Kitchen  lisinopril-hydrochlorothiazide (PRINZIDE,ZESTORETIC) 20-12.5 MG per tablet Take 1 tablet by mouth daily.      Marland Kitchen omeprazole (PRILOSEC) 20 MG capsule Take 20 mg by mouth daily.      . predniSONE (STERAPRED UNI-PAK) 10 MG tablet Take 1 tablet (10 mg total) by mouth daily. Day 1: take 6 tabs.  Day 2: 5 tabs  Day 3: 4 tabs  Day 4: 3 tabs  Day 5: 2 tabs  Day 6: 1 tab  21 tablet  0  . Tamsulosin HCl (FLOMAX) 0.4 MG CAPS Take by mouth.      . traZODone (DESYREL) 50 MG tablet Take 50 mg by mouth at bedtime.      Marland Kitchen HYDROcodone-acetaminophen (NORCO/VICODIN) 5-325 MG per tablet Take 1 tablet by mouth every 4 (four) hours as needed for pain.  10 tablet  0  . oxyCODONE-acetaminophen (PERCOCET/ROXICET) 5-325 MG per tablet Take 1 tablet by mouth every 4 (four) hours as needed for pain.  6 tablet  0   No current facility-administered medications for this visit.    Review of Systems Review of Systems  Constitutional: Negative.   Gastrointestinal: Negative for constipation.  Genitourinary:       Dribbling.  Hematological: Negative.     Blood pressure  140/90, pulse 86, temperature 98 F (36.7 C), resp. rate 18, height 5' 7.5" (1.715 m), weight 216 lb (97.977 kg).  Physical Exam Physical Exam  Constitutional: He appears well-developed and well-nourished. No distress.  HENT:  There is nasal swelling present. There is an abrasion on the bridge of the nose. There was swelling in the right chin area with some ecchymosis.  Eyes: No scleral icterus.  Cardiovascular: Normal rate and regular rhythm.   Pulmonary/Chest: Effort normal and breath sounds normal.  Abdominal: Soft. He exhibits no mass.  Long, wide midline scar.  Genitourinary:  There is a moderate size right inguinal bulge that is tender but reducible. No left inguinal bulge. Both testicles are descended and without masses.  Neurological: He is alert.  Skin: Skin is warm and dry.  Psychiatric: He has a normal mood and affect. His behavior is  normal.    Data Reviewed Emergency department notes.  Assessment    1. Symptomatic, reducible right inguinal hernia.  2. Facial swelling following fall 6 days ago.     Plan    CT of face to rule out any fractures.  Open right inguinal hernia repair with mesh.  I have explained the procedure, risks, and aftercare of inguinal hernia repair.  Risks include but are not limited to bleeding, infection, wound problems, anesthesia, recurrence, bladder or intestine injury, urinary retention, testicular dysfunction, chronic pain, mesh problems.  He seems to understand and agrees with the plan.        [name] J 01/30/2014, 11:13 AM

## 2014-01-30 NOTE — Patient Instructions (Addendum)
We will send you for a CT of your face and call you with those results.  We will also call you to schedule your surgery.  Go to [facility] and see if they can give your something to make your hernia more comfortable.   [facility] _______[facility]  INGUINAL HERNIA REPAIR: POST OP INSTRUCTIONS  Always review your discharge instruction sheet given to you by the facility where your surgery was performed. IF YOU HAVE DISABILITY OR FAMILY LEAVE FORMS, YOU MUST BRING THEM TO THE OFFICE FOR PROCESSING.   DO NOT GIVE THEM TO YOUR DOCTOR.  1. A  prescription for pain medication may be given to you upon discharge.  Take your pain medication as prescribed, if needed.  If narcotic pain medicine is not needed, then you may take acetaminophen (Tylenol) or ibuprofen (Advil) as needed. 2. Take your usually prescribed medications unless otherwise directed. 3. If you need a refill on your pain medication, please contact your pharmacy.  They will contact our office to request authorization. Prescriptions will not be filled after 5 pm or on week-ends. 4. You should follow a light diet the first 24 hours after arrival home, such as soup and crackers, etc.  Be sure to include lots of fluids daily.  Resume your normal diet the day after surgery. 5. Most patients will experience some swelling and bruising around the umbilicus or in the groin and scrotum.  Ice packs and reclining will help.  Swelling and bruising can take several days to resolve.  6. It is common to experience some constipation if taking pain medication after surgery.  Increasing fluid intake and taking a stool softener (such as Colace) will usually help or prevent this problem from occurring.  A mild laxative (Milk of Magnesia or Miralax) should be taken according to package directions if there are no bowel movements after 48 hours. 7. Unless discharge instructions indicate otherwise, you may remove your bandages 24-48 hours after  surgery, and you may shower at that time.  You may have steri-strips (small skin tapes) in place directly over the incision.  These strips should be left on the skin for 7-10 days.  If your surgeon used skin glue on the incision, you may shower in 24 hours.  The glue will flake off over the next 2-3 weeks.  Any sutures or staples will be removed at the office during your follow-up visit. 8. ACTIVITIES:  You may resume regular (light) daily activities beginning the next day-such as daily self-care, walking, climbing stairs-gradually increasing activities as tolerated.  You may have sexual intercourse when it is comfortable.  Refrain from any heavy lifting or straining for 6 weeks. a. You may drive when you are no longer taking prescription pain medication, you can comfortably wear a seatbelt, and you can safely maneuver your car and apply brakes. b. RETURN TO WORK:  __________________________________________________________ 9. You should see your doctor in the office for a follow-up appointment approximately 2-3 weeks after your surgery.  Make sure that you call for this appointment within a day or two after you arrive home to insure a convenient appointment time. 10. OTHER INSTRUCTIONS:  __________________________________________________________________________________________________________________________________________________________________________________________  WHEN TO CALL YOUR DOCTOR: 1. Fever over 101.0 2. Inability to urinate 3. Nausea and/or vomiting 4. Extreme swelling or bruising 5. Continued bleeding from incision. 6. Increased pain, redness, or drainage from the incision  The clinic staff is available to answer your questions during regular business hours.  Please don't hesitate to call  and ask to speak to one of the nurses for clinical concerns.  If you have a medical emergency, go to the nearest emergency room or call 911.  A surgeon from [facility] is always on call  at the hospital   [address], [place], [place], [place]  [address] ?  [address], [place], [place]   [address] [phone] ? [phone] ? FAX [phone] Web site: [email or web address]

## 2014-01-31 ENCOUNTER — Ambulatory Visit
Admission: RE | Admit: 2014-01-31 | Discharge: 2014-01-31 | Disposition: A | Discharge status: Home / Self Care | Payer: Medicare Other | Source: Ambulatory Visit | Attending: General Surgery | Admitting: General Surgery

## 2014-01-31 DIAGNOSIS — R51 Headache: Principal | ICD-10-CM

## 2014-01-31 DIAGNOSIS — R519 Headache, unspecified: Secondary | ICD-10-CM

## 2014-02-01 ENCOUNTER — Telehealth (INDEPENDENT_AMBULATORY_CARE_PROVIDER_SITE_OTHER): Payer: Self-pay

## 2014-02-01 NOTE — Telephone Encounter (Signed)
Message copied by Illene Regulus on Fri Feb 01, 2014  3:10 PM ------      Message from: [name]      Created: Fri Feb 01, 2014 10:10 AM       Please inform him that the CT scan of his face did not demonstrate any broken bones. ------

## 2014-02-01 NOTE — Telephone Encounter (Signed)
LMOM giving pt his results of the normal CT scan of the face that shows no broken bones per Dr Zella Richer.

## 2014-02-12 ENCOUNTER — Ambulatory Visit (INDEPENDENT_AMBULATORY_CARE_PROVIDER_SITE_OTHER): Payer: Medicare Other | Admitting: General Surgery

## 2014-02-22 ENCOUNTER — Other Ambulatory Visit: Payer: Self-pay | Admitting: Family Medicine

## 2014-02-22 DIAGNOSIS — R0989 Other specified symptoms and signs involving the circulatory and respiratory systems: Secondary | ICD-10-CM

## 2014-02-25 ENCOUNTER — Other Ambulatory Visit: Payer: Self-pay | Admitting: Family

## 2014-02-25 DIAGNOSIS — R0989 Other specified symptoms and signs involving the circulatory and respiratory systems: Secondary | ICD-10-CM

## 2014-02-25 DIAGNOSIS — R1030 Lower abdominal pain, unspecified: Secondary | ICD-10-CM

## 2014-02-27 ENCOUNTER — Ambulatory Visit
Admission: RE | Admit: 2014-02-27 | Discharge: 2014-02-27 | Disposition: A | Discharge status: Home / Self Care | Payer: Medicare Other | Source: Ambulatory Visit | Attending: Family | Admitting: Family

## 2014-02-27 DIAGNOSIS — R0989 Other specified symptoms and signs involving the circulatory and respiratory systems: Secondary | ICD-10-CM

## 2014-03-01 ENCOUNTER — Encounter (HOSPITAL_COMMUNITY): Payer: Self-pay | Admitting: Pharmacy Technician

## 2014-03-01 ENCOUNTER — Ambulatory Visit: Payer: Medicare Other | Admitting: Cardiology

## 2014-03-01 NOTE — Pre-Procedure Instructions (Signed)
[name]  03/01/2014   Your procedure is scheduled on:  Tuesday, August 25th  Report to [facility] Admitting at 0730 AM.  Call this number if you have problems the morning of surgery: [phone]   Remember:   Do not eat food or drink liquids after midnight.   Take these medicines the morning of surgery with A SIP OF WATER: lopressor, norvasc   Do not wear jewelry.  Do not wear lotions, powders, or perfumes. You may wear deodorant.  Do not shave 48 hours prior to surgery. Men may shave face and neck.  Do not bring valuables to the hospital.  [facility] is not responsible for any belongings or valuables.               Contacts, dentures or bridgework may not be worn into surgery.  Leave suitcase in the car. After surgery it may be brought to your room.  For patients admitted to the hospital, discharge time is determined by your treatment team.               Patients discharged the day of surgery will not be allowed to drive home.  Please read over the following fact sheets that you were given: Pain Booklet, Coughing and Deep Breathing and Surgical Site Infection Prevention [place] - Preparing for Surgery  Before surgery, you can play an important role.  Because skin is not sterile, your skin needs to be as free of germs as possible.  You can reduce the number of germs on you skin by washing with CHG (chlorahexidine gluconate) soap before surgery.  CHG is an antiseptic cleaner which kills germs and bonds with the skin to continue killing germs even after washing.  Please DO NOT use if you have an allergy to CHG or antibacterial soaps.  If your skin becomes reddened/irritated stop using the CHG and inform your nurse when you arrive at Short Stay.  Do not shave (including legs and underarms) for at least 48 hours prior to the first CHG shower.  You may shave your face.  Please follow these instructions carefully:   1.  Shower with CHG Soap the night before  surgery and the morning of Surgery.  2.  If you choose to wash your hair, wash your hair first as usual with your normal shampoo.  3.  After you shampoo, rinse your hair and body thoroughly to remove the shampoo.  4.  Use CHG as you would any other liquid soap.  You can apply CHG directly to the skin and wash gently with scrungie or a clean washcloth.  5.  Apply the CHG Soap to your body ONLY FROM THE NECK DOWN.  Do not use on open wounds or open sores.  Avoid contact with your eyes, ears, mouth and genitals (private parts).  Wash genitals (private parts) with your normal soap.  6.  Wash thoroughly, paying special attention to the area where your surgery will be performed.  7.  Thoroughly rinse your body with warm water from the neck down.  8.  DO NOT shower/wash with your normal soap after using and rinsing off the CHG Soap.  9.  Pat yourself dry with a clean towel.            10.  Wear clean pajamas.            11.  Place clean sheets on your bed the night of your first shower and do not sleep with  pets.  Day of Surgery  Do not apply any lotions/deoderants the morning of surgery.  Please wear clean clothes to the hospital/surgery center.

## 2014-03-04 ENCOUNTER — Inpatient Hospital Stay (HOSPITAL_COMMUNITY)
Admission: RE | Admit: 2014-03-04 | Discharge: 2014-03-04 | Disposition: A | Discharge status: Home / Self Care | Payer: Medicare Other | Source: Ambulatory Visit

## 2014-03-04 NOTE — Progress Notes (Signed)
Called patient regarding PAT appointment and pt stated that he was needing to get his heart checked out but no doctor had called him yet. He stated he was not coming in for a PAT until he knew what was going on with his heart. Encouraged him to call dr. Zella Richer regarding this. Pt verbalized understanding.

## 2014-03-05 ENCOUNTER — Telehealth: Payer: Self-pay | Admitting: Cardiology

## 2014-03-05 NOTE — Telephone Encounter (Signed)
Pt wanted to know if his results from the xray taken of his heart were in . Please call  thanks

## 2014-03-06 NOTE — Telephone Encounter (Signed)
Left message to call back  ?what xray is patient talking about

## 2014-03-07 NOTE — Telephone Encounter (Signed)
Spoke to patient. Patient states he received results already.

## 2014-03-12 ENCOUNTER — Ambulatory Visit (HOSPITAL_COMMUNITY): Admission: RE | Admit: 2014-03-12 | Payer: Medicare Other | Source: Ambulatory Visit | Admitting: General Surgery

## 2014-03-12 ENCOUNTER — Encounter (HOSPITAL_COMMUNITY): Admission: RE | Payer: Self-pay | Source: Ambulatory Visit

## 2014-03-12 SURGERY — REPAIR, HERNIA, INGUINAL, ADULT
Anesthesia: Choice | Laterality: Right

## 2014-03-26 ENCOUNTER — Encounter (INDEPENDENT_AMBULATORY_CARE_PROVIDER_SITE_OTHER): Payer: Medicare Other | Admitting: General Surgery

## 2014-03-29 ENCOUNTER — Encounter (INDEPENDENT_AMBULATORY_CARE_PROVIDER_SITE_OTHER): Payer: Self-pay

## 2014-04-03 ENCOUNTER — Emergency Department (HOSPITAL_COMMUNITY): Payer: Medicare Other

## 2014-04-03 ENCOUNTER — Encounter (HOSPITAL_COMMUNITY): Payer: Self-pay | Admitting: Emergency Medicine

## 2014-04-03 ENCOUNTER — Emergency Department (HOSPITAL_COMMUNITY)
Admission: EM | Admit: 2014-04-03 | Discharge: 2014-04-03 | Disposition: A | Discharge status: Home / Self Care | Payer: Medicare Other | Attending: Emergency Medicine | Admitting: Emergency Medicine

## 2014-04-03 DIAGNOSIS — Z791 Long term (current) use of non-steroidal anti-inflammatories (NSAID): Secondary | ICD-10-CM | POA: Diagnosis not present

## 2014-04-03 DIAGNOSIS — Z8546 Personal history of malignant neoplasm of prostate: Secondary | ICD-10-CM | POA: Diagnosis not present

## 2014-04-03 DIAGNOSIS — I10 Essential (primary) hypertension: Secondary | ICD-10-CM | POA: Insufficient documentation

## 2014-04-03 DIAGNOSIS — F17200 Nicotine dependence, unspecified, uncomplicated: Secondary | ICD-10-CM | POA: Insufficient documentation

## 2014-04-03 DIAGNOSIS — R5383 Other fatigue: Secondary | ICD-10-CM | POA: Diagnosis not present

## 2014-04-03 DIAGNOSIS — Z880 Allergy status to penicillin: Secondary | ICD-10-CM | POA: Insufficient documentation

## 2014-04-03 DIAGNOSIS — R51 Headache: Secondary | ICD-10-CM | POA: Diagnosis present

## 2014-04-03 DIAGNOSIS — R5381 Other malaise: Secondary | ICD-10-CM | POA: Diagnosis not present

## 2014-04-03 DIAGNOSIS — D649 Anemia, unspecified: Secondary | ICD-10-CM | POA: Diagnosis not present

## 2014-04-03 DIAGNOSIS — Z79899 Other long term (current) drug therapy: Secondary | ICD-10-CM | POA: Insufficient documentation

## 2014-04-03 DIAGNOSIS — R531 Weakness: Secondary | ICD-10-CM

## 2014-04-03 DIAGNOSIS — K4090 Unilateral inguinal hernia, without obstruction or gangrene, not specified as recurrent: Secondary | ICD-10-CM | POA: Insufficient documentation

## 2014-04-03 LAB — CBC WITH DIFFERENTIAL/PLATELET
Basophils Absolute: 0 10*3/uL (ref 0.0–0.1)
Basophils Relative: 1 % (ref 0–1)
Eosinophils Absolute: 0.1 10*3/uL (ref 0.0–0.7)
Eosinophils Relative: 2 % (ref 0–5)
HCT: 33 % — ABNORMAL LOW (ref 39.0–52.0)
Hemoglobin: 9.9 g/dL — ABNORMAL LOW (ref 13.0–17.0)
Lymphocytes Relative: 27 % (ref 12–46)
Lymphs Abs: 1.6 10*3/uL (ref 0.7–4.0)
MCH: 23 pg — ABNORMAL LOW (ref 26.0–34.0)
MCHC: 30 g/dL (ref 30.0–36.0)
MCV: 76.6 fL — ABNORMAL LOW (ref 78.0–100.0)
Monocytes Absolute: 0.6 10*3/uL (ref 0.1–1.0)
Monocytes Relative: 10 % (ref 3–12)
Neutro Abs: 3.5 10*3/uL (ref 1.7–7.7)
Neutrophils Relative %: 60 % (ref 43–77)
Platelets: 291 10*3/uL (ref 150–400)
RBC: 4.31 MIL/uL (ref 4.22–5.81)
RDW: 17.1 % — ABNORMAL HIGH (ref 11.5–15.5)
WBC: 5.8 10*3/uL (ref 4.0–10.5)

## 2014-04-03 LAB — COMPREHENSIVE METABOLIC PANEL
ALT: 20 U/L (ref 0–53)
AST: 25 U/L (ref 0–37)
Albumin: 3.8 g/dL (ref 3.5–5.2)
Alkaline Phosphatase: 110 U/L (ref 39–117)
Anion gap: 12 (ref 5–15)
BUN: 16 mg/dL (ref 6–23)
CO2: 24 mEq/L (ref 19–32)
Calcium: 9.4 mg/dL (ref 8.4–10.5)
Chloride: 102 mEq/L (ref 96–112)
Creatinine, Ser: 0.85 mg/dL (ref 0.50–1.35)
GFR calc Af Amer: >90 mL/min (ref >90)
GFR calc non Af Amer: 89 mL/min — ABNORMAL LOW (ref >90)
Glucose, Bld: 90 mg/dL (ref 70–99)
Potassium: 4.5 mEq/L (ref 3.7–5.3)
Sodium: 138 mEq/L (ref 137–147)
Total Bilirubin: 0.3 mg/dL (ref 0.3–1.2)
Total Protein: 7.3 g/dL (ref 6.0–8.3)

## 2014-04-03 LAB — TROPONIN I: Troponin I: <0.3 ng/mL (ref <0.30)

## 2014-04-03 LAB — AMMONIA: Ammonia: 38 umol/L (ref 11–60)

## 2014-04-03 MED ORDER — SODIUM CHLORIDE 0.9 % IV BOLUS (SEPSIS)
1000.0000 mL | Freq: Once | INTRAVENOUS | Status: AC
  Administered 2014-04-03: 1000 mL via INTRAVENOUS

## 2014-04-03 MED ORDER — OXYCODONE HCL 5 MG PO TABS: 5.0000 mg | ORAL_TABLET | ORAL | Status: DC | PRN

## 2014-04-03 MED ORDER — MORPHINE SULFATE 4 MG/ML IJ SOLN
4.0000 mg | Freq: Once | INTRAMUSCULAR | Status: AC
  Administered 2014-04-03: 4 mg via INTRAVENOUS
  Filled 2014-04-03: qty 1

## 2014-04-03 NOTE — ED Notes (Signed)
Per EMS - pt c/o HA x 3 days that has worsened since last night. Hx of HTN and hernia, pt c/o pain to hernia and wants to get that checked out right now too. No neuro deficits. Pt has chronic HAs and gets them frequently. BP 180/100. HR 82 NSR, 97% on room air. Nad, skin warm and dry, resp e/u.

## 2014-04-03 NOTE — ED Provider Notes (Signed)
CSN: [id number]     Arrival date & time 04/03/14  1241 History   First MD Initiated Contact with Patient 04/03/14 1325     Chief Complaint  Patient presents with  . Headache  . Hypertension     (Consider location/radiation/quality/duration/timing/severity/associated sxs/prior Treatment) HPI 63 year old male presents with multiple complaints. His primary complaint appears to be diffuse pain. He states her last 3-4 weeks she's been having pain in his knees and right shoulder and right chest. He also has had a headache on the left side his head for the last 3 weeks. States it seems to come and go. Describes intermittent numbness to his inner bilateral thighs and fingertips. The symptoms are gone currently. He has felt diffusely weak and fatigued and his significant other the bedside states he almost passed out last night. He was not confused. He is requesting that his liver be checked because he missed his appointment of his liver evaluated last month. He denies any right upper quadrant pain. He has not had known liver problems that he is aware of. There've been no fevers or chills. No dyspnea. No focal weakness. He's also complaining of a right inguinal hernia the states keeps bulging out whenever he coughs or does exertional activities. It is not swollen at this time.  Past Medical History  Diagnosis Date  . Hypertension   . Hernia   . Prostate ca    Past Surgical History  Procedure Laterality Date  . Appendectomy    . Small intestine surgery    . Multiple abdominal surgeries     No family history on file. History  Substance Use Topics  . Smoking status: Current Every Day Smoker -- 1.00 packs/day    Types: Cigarettes  . Smokeless tobacco: Never Used  . Alcohol Use: 0.0 oz/week     Comment: last drink 2 weeks ago    Review of Systems  Constitutional: Positive for fatigue.  Respiratory: Negative for shortness of breath.   Cardiovascular: Positive for chest pain.   Gastrointestinal: Positive for abdominal pain. Negative for vomiting.  Genitourinary: Negative for dysuria.  Musculoskeletal: Positive for arthralgias and back pain.  Neurological: Positive for weakness, numbness and headaches.  All other systems reviewed and are negative.     Allergies  Penicillins and Sudafed  Home Medications   Prior to Admission medications   Medication Sig Start Date End Date Taking? Authorizing Provider  amLODipine (NORVASC) 10 MG tablet Take 10 mg by mouth daily.    Yes Historical Provider, MD  Cholecalciferol (VITAMIN D3) 2000 UNITS TABS Take 2,000 Units by mouth daily.   Yes Historical Provider, MD  cloNIDine (CATAPRES) 0.2 MG tablet Take 0.2 mg by mouth 2 (two) times daily.   Yes Historical Provider, MD  ferrous sulfate 325 (65 FE) MG tablet Take 325 mg by mouth 2 (two) times daily with a meal.   Yes Historical Provider, MD  lisinopril (PRINIVIL,ZESTRIL) 10 MG tablet Take 10 mg by mouth daily.  12/12/13  Yes Historical Provider, MD  metoprolol tartrate (LOPRESSOR) 25 MG tablet Take 25 mg by mouth daily.    Yes Historical Provider, MD  naproxen sodium (ALEVE) 220 MG tablet Take 1,100 mg by mouth 2 (two) times daily as needed (severe pain).   Yes Historical Provider, MD  oxybutynin (DITROPAN XL) 15 MG 24 hr tablet Take 15 mg by mouth daily.   Yes Historical Provider, MD  oxyCODONE-acetaminophen (PERCOCET) 10-325 MG per tablet Take 1 tablet by mouth every 4 (four) hours as  needed for pain.   Yes Historical Provider, MD   BP 157/73  Pulse 58  Temp(Src) 98.2 F (36.8 C) (Oral)  Resp 15  Ht 5\' 7"  (1.702 m)  Wt 224 lb (101.606 kg)  BMI 35.08 kg/m2  SpO2 98% Physical Exam  Nursing note and vitals reviewed. Constitutional: He is oriented to person, place, and time. He appears well-developed and well-nourished.  HENT:  Head: Normocephalic and atraumatic.  Right Ear: External ear normal.  Left Ear: External ear normal.  Nose: Nose normal.  Eyes: Right eye  exhibits no discharge. Left eye exhibits no discharge.  Neck: Neck supple.  Cardiovascular: Normal rate, regular rhythm, normal heart sounds and intact distal pulses.   Pulmonary/Chest: Effort normal and breath sounds normal. He exhibits tenderness.  Abdominal: Soft. He exhibits no distension. There is no tenderness. A hernia is present. Hernia confirmed positive in the right inguinal area (easily reducible. No bowel appreciated).  Musculoskeletal: He exhibits no edema.  Neurological: He is alert and oriented to person, place, and time.  CN 2-12 grossly intact. All 4 extremities are weak but able to go against some resistance.   Skin: Skin is warm and dry.    ED Course  Procedures (including critical care time) Labs Review Labs Reviewed  CBC WITH DIFFERENTIAL - Abnormal; Notable for the following:    Hemoglobin 9.9 (*)    HCT 33.0 (*)    MCV 76.6 (*)    MCH 23.0 (*)    RDW 17.1 (*)    All other components within normal limits  COMPREHENSIVE METABOLIC PANEL - Abnormal; Notable for the following:    GFR calc non Af Amer 89 (*)    All other components within normal limits  TROPONIN I  AMMONIA    Imaging Review Dg Chest 2 View  04/03/2014   CLINICAL DATA:  Shortness of breath and right-sided chest pain.  EXAM: CHEST - 2 VIEW  COMPARISON:  None  FINDINGS: The heart size and mediastinal contours are within normal limits. There is no evidence of pulmonary edema, consolidation, pneumothorax, nodule or pleural fluid. Degenerative changes are present of the thoracic spine.  IMPRESSION: No active disease.   Electronically Signed   By: Aletta Edouard M.D.   On: 04/03/2014 15:47   Ct Head Wo Contrast  04/03/2014   CLINICAL DATA:  Headache for 3 days, worse since last night  EXAM: CT HEAD WITHOUT CONTRAST  TECHNIQUE: Contiguous axial images were obtained from the base of the skull through the vertex without intravenous contrast.  COMPARISON:  Maxillofacial CT 01/31/2014, most recent similar  comparison head CT 05/01/2012  FINDINGS: Prominent right parietal probable perivascular space or lacunar infarct reidentified image 20. Mild diffuse cortical volume loss with proportional ventricular prominence is stable. No midline shift. No acute hemorrhage, infarct, or mass lesion is identified. Punctate radiopacity within the right scalp vertex image 68 is reidentified. No skull fracture. Orbits and paranasal sinuses demonstrate mild diffuse mucoperiosteal thickening but no air-fluid level or acute osseous abnormality.  IMPRESSION: No acute intracranial process.   Electronically Signed   By: Conchita Paris M.D.   On: 04/03/2014 15:59     EKG Interpretation   Date/Time:  Wednesday April 03 2014 14:08:40 EDT Ventricular Rate:  58 PR Interval:  215 QRS Duration: 93 QT Interval:  483 QTC Calculation: 474 R Axis:   -13 Text Interpretation:  Sinus rhythm Borderline prolonged PR interval  Abnormal R-wave progression, early transition Left ventricular hypertrophy  Anterior ST elevation, probably  due to LVH nonspecific T waves I, AVL  Confirmed by Glendola Friedhoff  MD, Devontay Celaya ([id number]) on 04/03/2014 3:44:24 PM      MDM   Final diagnoses:  Weakness  Anemia, unspecified anemia type    Patient with multiple complaints, the primary one appears to be joint pain these had for several weeks to months. There is no swelling or focal tenderness in his joints. His weakness could be explained by anemia with hemoglobin of 10. Previous records show his typical hemoglobin is 11 or 12 but this was in 2013. He's had no blood in his stools.  He is currently on iron. He is complaining of weeks to months of right-sided chest pain and shoulder pain as well. Chest x-ray is benign. His EKG has some nonspecific changes compared to several years ago to this point have low sufficient this is ACS. He was able to sit up in bed and pulled to help with his arms does not have any focal weakness. I doubt that it is atypical numbness  is stroke related. He has no urinary symptoms. At this point he feels improved with one dose of pain medicine feels well enough to go home and will follow up with his PCP for outpatient pain control and workup of his anemia and near syncope.    Ephraim Hamburger, MD 04/03/14 [phone]

## 2014-04-03 NOTE — ED Notes (Signed)
Pt aware of need for urine specimen and has urinal at bedside.

## 2014-04-03 NOTE — Discharge Instructions (Signed)
Anemia, Nonspecific Anemia is a condition in which the concentration of red blood cells or hemoglobin in the blood is below normal. Hemoglobin is a substance in red blood cells that carries oxygen to the tissues of the body. Anemia results in not enough oxygen reaching these tissues.  CAUSES  Common causes of anemia include:   Excessive bleeding. Bleeding may be internal or external. This includes excessive bleeding from periods (in women) or from the intestine.   Poor nutrition.   Chronic kidney, thyroid, and liver disease.  Bone marrow disorders that decrease red blood cell production.  Cancer and treatments for cancer.  HIV, AIDS, and their treatments.  Spleen problems that increase red blood cell destruction.  Blood disorders.  Excess destruction of red blood cells due to infection, medicines, and autoimmune disorders. SIGNS AND SYMPTOMS   Minor weakness.   Dizziness.   Headache.  Palpitations.   Shortness of breath, especially with exercise.   Paleness.  Cold sensitivity.  Indigestion.  Nausea.  Difficulty sleeping.  Difficulty concentrating. Symptoms may occur suddenly or they may develop slowly.  DIAGNOSIS  Additional blood tests are often needed. These help your health care provider determine the best treatment. Your health care provider will check your stool for blood and look for other causes of blood loss.  TREATMENT  Treatment varies depending on the cause of the anemia. Treatment can include:   Supplements of iron, vitamin D40, or folic acid.   Hormone medicines.   A blood transfusion. This may be needed if blood loss is severe.   Hospitalization. This may be needed if there is significant continual blood loss.   Dietary changes.  Spleen removal. HOME CARE INSTRUCTIONS Keep all follow-up appointments. It often takes many weeks to correct anemia, and having your health care provider check on your condition and your response to  treatment is very important. SEEK IMMEDIATE MEDICAL CARE IF:   You develop extreme weakness, shortness of breath, or chest pain.   You become dizzy or have trouble concentrating.  You develop heavy vaginal bleeding.   You develop a rash.   You have bloody or black, tarry stools.   You faint.   You vomit up blood.   You vomit repeatedly.   You have abdominal pain.  You have a fever or persistent symptoms for more than 2-3 days.   You have a fever and your symptoms suddenly get worse.   You are dehydrated.  MAKE SURE YOU:  Understand these instructions.  Will watch your condition.  Will get help right away if you are not doing well or get worse. Document Released: 08/12/2004 Document Revised: 03/07/2013 Document Reviewed: 12/29/2012 [facility] Patient Information 2015 [place], [place]. This information is not intended to replace advice given to you by your health care provider. Make sure you discuss any questions you have with your health care provider.    Weakness Weakness is a lack of strength. It may be felt all over the body (generalized) or in one specific part of the body (focal). Some causes of weakness can be serious. You may need further medical evaluation, especially if you are elderly or you have a history of immunosuppression (such as chemotherapy or HIV), kidney disease, heart disease, or diabetes. CAUSES  Weakness can be caused by many different things, including:  Infection.  Physical exhaustion.  Internal bleeding or other blood loss that results in a lack of red blood cells (anemia).  Dehydration. This cause is more common in elderly people.  Side  effects or electrolyte abnormalities from medicines, such as pain medicines or sedatives.  Emotional distress, anxiety, or depression.  Circulation problems, especially severe peripheral arterial disease.  Heart disease, such as rapid atrial fibrillation, bradycardia, or heart  failure.  Nervous system disorders, such as Guillain-Barr syndrome, multiple sclerosis, or stroke. DIAGNOSIS  To find the cause of your weakness, your caregiver will take your history and perform a physical exam. Lab tests or X-rays may also be ordered, if needed. TREATMENT  Treatment of weakness depends on the cause of your symptoms and can vary greatly. HOME CARE INSTRUCTIONS   Rest as needed.  Eat a well-balanced diet.  Try to get some exercise every day.  Only take over-the-counter or prescription medicines as directed by your caregiver. SEEK MEDICAL CARE IF:   Your weakness seems to be getting worse or spreads to other parts of your body.  You develop new aches or pains. SEEK IMMEDIATE MEDICAL CARE IF:   You cannot perform your normal daily activities, such as getting dressed and feeding yourself.  You cannot walk up and down stairs, or you feel exhausted when you do so.  You have shortness of breath or chest pain.  You have difficulty moving parts of your body.  You have weakness in only one area of the body or on only one side of the body.  You have a fever.  You have trouble speaking or swallowing.  You cannot control your bladder or bowel movements.  You have black or bloody vomit or stools. MAKE SURE YOU:  Understand these instructions.  Will watch your condition.  Will get help right away if you are not doing well or get worse. Document Released: 07/05/2005 Document Revised: 01/04/2012 Document Reviewed: 09/03/2011 [facility] Patient Information 2015 [place], [place]. This information is not intended to replace advice given to you by your health care provider. Make sure you discuss any questions you have with your health care provider.    Arthralgia Your caregiver has diagnosed you as suffering from an arthralgia. Arthralgia means there is pain in a joint. This can come from many reasons including:  Bruising the joint which causes soreness  (inflammation) in the joint.  Wear and tear on the joints which occur as we grow older (osteoarthritis).  Overusing the joint.  Various forms of arthritis.  Infections of the joint. Regardless of the cause of pain in your joint, most of these different pains respond to anti-inflammatory drugs and rest. The exception to this is when a joint is infected, and these cases are treated with antibiotics, if it is a bacterial infection. HOME CARE INSTRUCTIONS   Rest the injured area for as long as directed by your caregiver. Then slowly start using the joint as directed by your caregiver and as the pain allows. Crutches as directed may be useful if the ankles, knees or hips are involved. If the knee was splinted or casted, continue use and care as directed. If an stretchy or elastic wrapping bandage has been applied today, it should be removed and re-applied every 3 to 4 hours. It should not be applied tightly, but firmly enough to keep swelling down. Watch toes and feet for swelling, bluish discoloration, coldness, numbness or excessive pain. If any of these problems (symptoms) occur, remove the ace bandage and re-apply more loosely. If these symptoms persist, contact your caregiver or return to this location.  For the first 24 hours, keep the injured extremity elevated on pillows while lying down.  Apply ice for  15-20 minutes to the sore joint every couple hours while awake for the first half day. Then 03-04 times per day for the first 48 hours. Put the ice in a plastic bag and place a towel between the bag of ice and your skin.  Wear any splinting, casting, elastic bandage applications, or slings as instructed.  Only take over-the-counter or prescription medicines for pain, discomfort, or fever as directed by your caregiver. Do not use aspirin immediately after the injury unless instructed by your physician. Aspirin can cause increased bleeding and bruising of the tissues.  If you were given  crutches, continue to use them as instructed and do not resume weight bearing on the sore joint until instructed. Persistent pain and inability to use the sore joint as directed for more than 2 to 3 days are warning signs indicating that you should see a caregiver for a follow-up visit as soon as possible. Initially, a hairline fracture (break in bone) may not be evident on X-rays. Persistent pain and swelling indicate that further evaluation, non-weight bearing or use of the joint (use of crutches or slings as instructed), or further X-rays are indicated. X-rays may sometimes not show a small fracture until a week or 10 days later. Make a follow-up appointment with your own caregiver or one to whom we have referred you. A radiologist (specialist in reading X-rays) may read your X-rays. Make sure you know how you are to obtain your X-ray results. Do not assume everything is normal if you do not hear from [place]. SEEK MEDICAL CARE IF: Bruising, swelling, or pain increases. SEEK IMMEDIATE MEDICAL CARE IF:   Your fingers or toes are numb or blue.  The pain is not responding to medications and continues to stay the same or get worse.  The pain in your joint becomes severe.  You develop a fever over 102 F (38.9 C).  It becomes impossible to move or use the joint. MAKE SURE YOU:   Understand these instructions.  Will watch your condition.  Will get help right away if you are not doing well or get worse. Document Released: 07/05/2005 Document Revised: 09/27/2011 Document Reviewed: 02/21/2008 [facility] Patient Information 2015 [place], [place]. This information is not intended to replace advice given to you by your health care provider. Make sure you discuss any questions you have with your health care provider.

## 2014-04-05 ENCOUNTER — Encounter: Payer: Self-pay | Admitting: *Deleted

## 2014-04-10 ENCOUNTER — Ambulatory Visit (INDEPENDENT_AMBULATORY_CARE_PROVIDER_SITE_OTHER): Payer: Medicare Other | Admitting: Cardiology

## 2014-04-10 ENCOUNTER — Encounter: Payer: Self-pay | Admitting: Cardiology

## 2014-04-10 VITALS — BP 156/100 | HR 62 | Ht 67.0 in | Wt 228.0 lb

## 2014-04-10 DIAGNOSIS — R0683 Snoring: Secondary | ICD-10-CM

## 2014-04-10 DIAGNOSIS — R0989 Other specified symptoms and signs involving the circulatory and respiratory systems: Secondary | ICD-10-CM

## 2014-04-10 DIAGNOSIS — R0609 Other forms of dyspnea: Secondary | ICD-10-CM

## 2014-04-10 DIAGNOSIS — G4710 Hypersomnia, unspecified: Secondary | ICD-10-CM

## 2014-04-10 DIAGNOSIS — I10 Essential (primary) hypertension: Secondary | ICD-10-CM | POA: Insufficient documentation

## 2014-04-10 DIAGNOSIS — R0600 Dyspnea, unspecified: Secondary | ICD-10-CM

## 2014-04-10 DIAGNOSIS — R079 Chest pain, unspecified: Secondary | ICD-10-CM

## 2014-04-10 DIAGNOSIS — G4719 Other hypersomnia: Secondary | ICD-10-CM

## 2014-04-10 MED ORDER — BD ASSURE BPM/AUTO ARM CUFF MISC: 1.0000 | Freq: Once | Status: DC

## 2014-04-10 NOTE — Patient Instructions (Signed)
Your physician recommends that you schedule a follow-up appointment in:  6 months with Dr. Percival Spanish  Use your BP Cuff as directed  We are ordering a sleep study  We are ordering a stress test

## 2014-04-10 NOTE — Progress Notes (Signed)
HPI The patient presents for evaluation and management of multiple issues including dyspnea and difficult to control hypertension. He was seen by another cardiologist in town but apparently did not follow through with plans for stress testing in the past. I do note that he has had difficult to control hypertension. He has multiple complaints today. He has pain in his knees that limits his walking. He had shortness of breath which happens with any activities such as walking a moderate distance on level ground. He says he doesn't sleep well. He apparently does snore. He wakes up tired. He may be short of breath at night but he's not describing classic PND or orthopnea. He does describe some chest discomfort for the right side and radiating around to his right shoulder. It happens sporadically. He is unable to do any exercising and so his not sure that he can bring on this discomfort with exertion.  He's not describing palpitations, presyncope or syncope. He does smoke cigarettes.  Allergies  Allergen Reactions  . Penicillins Anaphylaxis  . Sudafed [Pseudoephedrine] Anaphylaxis    Current Outpatient Prescriptions  Medication Sig Dispense Refill  . amLODipine (NORVASC) 10 MG tablet Take 10 mg by mouth daily.       . Cholecalciferol (VITAMIN D3) 2000 UNITS TABS Take 2,000 Units by mouth daily.      . cloNIDine (CATAPRES) 0.2 MG tablet Take 0.2 mg by mouth 2 (two) times daily.      . ferrous sulfate 325 (65 FE) MG tablet Take 325 mg by mouth 2 (two) times daily with a meal.      . lisinopril (PRINIVIL,ZESTRIL) 10 MG tablet Take 10 mg by mouth daily.       . metoprolol tartrate (LOPRESSOR) 25 MG tablet Take 25 mg by mouth daily.       . naproxen sodium (ALEVE) 220 MG tablet Take 1,100 mg by mouth 2 (two) times daily as needed (severe pain).      Marland Kitchen oxybutynin (DITROPAN XL) 15 MG 24 hr tablet Take 15 mg by mouth daily.      Marland Kitchen oxyCODONE (ROXICODONE) 5 MG immediate release tablet Take 1 tablet (5 mg  total) by mouth every 4 (four) hours as needed for severe pain.  20 tablet  0  . oxyCODONE-acetaminophen (PERCOCET) 10-325 MG per tablet Take 1 tablet by mouth every 4 (four) hours as needed for pain.       No current facility-administered medications for this visit.    Past Medical History  Diagnosis Date  . Hypertension   . Hernia   . Prostate ca   . Hepatitis C     Past Surgical History  Procedure Laterality Date  . Appendectomy    . Small intestine surgery    . Multiple abdominal surgeries      No family history on file.  History   Social History  . Marital Status: Legally Separated    Spouse Name: N/A    Number of Children: N/A  . Years of Education: N/A   Occupational History  . Not on file.   Social History Main Topics  . Smoking status: Current Every Day Smoker -- 1.00 packs/day    Types: Cigarettes  . Smokeless tobacco: Never Used  . Alcohol Use: 0.0 oz/week     Comment: last drink 2 weeks ago  . Drug Use: 5.00 per week    Special: Marijuana  . Sexual Activity: Not on file   Other Topics Concern  . Not on file  Social History Narrative  . No narrative on file    ROS:  Headache, dizziness, tinnitus, reflux, leg cramps, joint pain, back pain. Otherwise as stated in the HPI and negative for all other systems.  PHYSICAL EXAM BP 156/100  Pulse 62  Ht 5\' 7"  (1.702 m)  Wt 228 lb (103.42 kg)  BMI 35.70 kg/m2 GENERAL:  Well appearing HEENT:  Pupils equal round and reactive, fundi not visualized, oral mucosa unremarkable NECK:  No jugular venous distention, waveform within normal limits, carotid upstroke brisk and symmetric, no bruits, no thyromegaly LYMPHATICS:  No cervical, inguinal adenopathy LUNGS:  Clear to auscultation bilaterally BACK:  No CVA tenderness CHEST:  Unremarkable HEART:  PMI not displaced or sustained,S1 and S2 within normal limits, no S3, no S4, no clicks, no rubs, soft apical systolic murmur radiating slightly out the aortic  outflow tract, no diastolic murmurs ABD:  Flat, positive bowel sounds normal in frequency in pitch, no bruits, no rebound, no guarding, no midline pulsatile mass, no hepatomegaly, no splenomegaly, multiple surgical wounds healed EXT:  2 plus pulses throughout, no edema, no cyanosis no clubbing SKIN:  No rashes no nodules NEURO:  Cranial nerves II through XII grossly intact, motor grossly intact throughout PSYCH:  Cognitively intact, oriented to person place and time   EKG:   Sinus rhythm, rate 62, axis within normal limits, intervals within normal limits, left ventricular hypertrophy by voltage criteria repolarization changes. 04/10/2014  ASSESSMENT AND PLAN   DYSPNEA:  He could certainly have an anginal well. He needs stress testing but he could not walk a treadmill. Therefore, he will have a [employer].  AAA:  He had some mild abdominal aortic ectasia. This can be followed over time with serial ultrasounds as indicated. He does need risk reduction.  TOBACCO ABUSE:  He understands the need to quit smoking. He has been educated.  HTN:  For now I am going to leave him on the meds as listed. He might need additional medications such as spironolactone. We will get a blood pressure cuff and keep a blood pressure diary.  SLEEP APNEA:  I suspect he has this. We will be screening for sleep study. This may be contributing to his difficult to control hypertension.

## 2014-04-18 ENCOUNTER — Telehealth (HOSPITAL_COMMUNITY): Payer: Self-pay

## 2014-04-18 NOTE — Telephone Encounter (Signed)
Encounter complete. 

## 2014-04-24 ENCOUNTER — Encounter (HOSPITAL_COMMUNITY): Payer: Medicare Other

## 2014-07-19 DIAGNOSIS — I639 Cerebral infarction, unspecified: Secondary | ICD-10-CM

## 2014-07-19 HISTORY — DX: Cerebral infarction, unspecified: I63.9

## 2014-09-06 ENCOUNTER — Emergency Department (HOSPITAL_COMMUNITY): Payer: Medicare Other

## 2014-09-06 ENCOUNTER — Encounter (HOSPITAL_COMMUNITY): Payer: Self-pay | Admitting: Emergency Medicine

## 2014-09-06 ENCOUNTER — Emergency Department (HOSPITAL_COMMUNITY)
Admission: EM | Admit: 2014-09-06 | Discharge: 2014-09-06 | Disposition: A | Discharge status: Home / Self Care | Payer: Medicare Other | Attending: Emergency Medicine | Admitting: Emergency Medicine

## 2014-09-06 DIAGNOSIS — Z9114 Patient's other noncompliance with medication regimen: Secondary | ICD-10-CM

## 2014-09-06 DIAGNOSIS — Z720 Tobacco use: Secondary | ICD-10-CM | POA: Diagnosis not present

## 2014-09-06 DIAGNOSIS — Z880 Allergy status to penicillin: Secondary | ICD-10-CM | POA: Insufficient documentation

## 2014-09-06 DIAGNOSIS — Z9119 Patient's noncompliance with other medical treatment and regimen: Secondary | ICD-10-CM | POA: Insufficient documentation

## 2014-09-06 DIAGNOSIS — K4090 Unilateral inguinal hernia, without obstruction or gangrene, not specified as recurrent: Secondary | ICD-10-CM | POA: Diagnosis not present

## 2014-09-06 DIAGNOSIS — Z8639 Personal history of other endocrine, nutritional and metabolic disease: Secondary | ICD-10-CM | POA: Insufficient documentation

## 2014-09-06 DIAGNOSIS — Z8619 Personal history of other infectious and parasitic diseases: Secondary | ICD-10-CM | POA: Insufficient documentation

## 2014-09-06 DIAGNOSIS — I10 Essential (primary) hypertension: Secondary | ICD-10-CM | POA: Insufficient documentation

## 2014-09-06 DIAGNOSIS — Z79899 Other long term (current) drug therapy: Secondary | ICD-10-CM | POA: Insufficient documentation

## 2014-09-06 DIAGNOSIS — Z8546 Personal history of malignant neoplasm of prostate: Secondary | ICD-10-CM | POA: Insufficient documentation

## 2014-09-06 DIAGNOSIS — R109 Unspecified abdominal pain: Secondary | ICD-10-CM | POA: Diagnosis present

## 2014-09-06 LAB — COMPREHENSIVE METABOLIC PANEL
ALT: 15 U/L (ref 0–53)
AST: 28 U/L (ref 0–37)
Albumin: 3.9 g/dL (ref 3.5–5.2)
Alkaline Phosphatase: 115 U/L (ref 39–117)
Anion gap: 8 (ref 5–15)
BUN: 11 mg/dL (ref 6–23)
CO2: 25 mmol/L (ref 19–32)
Calcium: 9.2 mg/dL (ref 8.4–10.5)
Chloride: 106 mmol/L (ref 96–112)
Creatinine, Ser: 0.87 mg/dL (ref 0.50–1.35)
GFR calc Af Amer: >90 mL/min (ref >90)
GFR calc non Af Amer: 89 mL/min — ABNORMAL LOW (ref >90)
Glucose, Bld: 85 mg/dL (ref 70–99)
Potassium: 4.4 mmol/L (ref 3.5–5.1)
Sodium: 139 mmol/L (ref 135–145)
Total Bilirubin: 0.3 mg/dL (ref 0.3–1.2)
Total Protein: 7.5 g/dL (ref 6.0–8.3)

## 2014-09-06 LAB — LIPASE, BLOOD: Lipase: 21 U/L (ref 11–59)

## 2014-09-06 LAB — CBC WITH DIFFERENTIAL/PLATELET
Basophils Absolute: 0 10*3/uL (ref 0.0–0.1)
Basophils Relative: 1 % (ref 0–1)
Eosinophils Absolute: 0.1 10*3/uL (ref 0.0–0.7)
Eosinophils Relative: 2 % (ref 0–5)
HCT: 37.2 % — ABNORMAL LOW (ref 39.0–52.0)
Hemoglobin: 11.2 g/dL — ABNORMAL LOW (ref 13.0–17.0)
Lymphocytes Relative: 37 % (ref 12–46)
Lymphs Abs: 1.7 10*3/uL (ref 0.7–4.0)
MCH: 23.5 pg — ABNORMAL LOW (ref 26.0–34.0)
MCHC: 30.1 g/dL (ref 30.0–36.0)
MCV: 78 fL (ref 78.0–100.0)
Monocytes Absolute: 0.5 10*3/uL (ref 0.1–1.0)
Monocytes Relative: 11 % (ref 3–12)
Neutro Abs: 2.4 10*3/uL (ref 1.7–7.7)
Neutrophils Relative %: 49 % (ref 43–77)
Platelets: 322 10*3/uL (ref 150–400)
RBC: 4.77 MIL/uL (ref 4.22–5.81)
RDW: 16.1 % — ABNORMAL HIGH (ref 11.5–15.5)
WBC: 4.7 10*3/uL (ref 4.0–10.5)

## 2014-09-06 LAB — I-STAT TROPONIN, ED: Troponin i, poc: 0.01 ng/mL (ref 0.00–0.08)

## 2014-09-06 MED ORDER — HYDROMORPHONE HCL 1 MG/ML IJ SOLN
1.0000 mg | Freq: Once | INTRAMUSCULAR | Status: AC
  Administered 2014-09-06: 1 mg via INTRAVENOUS
  Filled 2014-09-06: qty 1

## 2014-09-06 MED ORDER — ONDANSETRON 8 MG PO TBDP: 8.0000 mg | ORAL_TABLET | Freq: Three times a day (TID) | ORAL | Status: DC | PRN

## 2014-09-06 MED ORDER — ONDANSETRON HCL 4 MG/2ML IJ SOLN
4.0000 mg | Freq: Once | INTRAMUSCULAR | Status: AC
  Administered 2014-09-06: 4 mg via INTRAVENOUS
  Filled 2014-09-06: qty 2

## 2014-09-06 MED ORDER — LISINOPRIL 10 MG PO TABS
10.0000 mg | ORAL_TABLET | Freq: Every day | ORAL | Status: DC
Start: 2014-09-06 — End: 2014-09-18

## 2014-09-06 MED ORDER — OXYCODONE-ACETAMINOPHEN 5-325 MG PO TABS: 1.0000 | ORAL_TABLET | ORAL | Status: DC | PRN

## 2014-09-06 MED ORDER — IOHEXOL 300 MG/ML  SOLN
100.0000 mL | Freq: Once | INTRAMUSCULAR | Status: AC | PRN
  Administered 2014-09-06: 100 mL via INTRAVENOUS

## 2014-09-06 MED ORDER — IOHEXOL 300 MG/ML  SOLN
50.0000 mL | Freq: Once | INTRAMUSCULAR | Status: AC | PRN
  Administered 2014-09-06: 50 mL via ORAL

## 2014-09-06 NOTE — ED Notes (Signed)
Pt reports RLQ tenderness for a years and n/v/d for 3 weeks.

## 2014-09-06 NOTE — ED Provider Notes (Signed)
CSN: [id number]     Arrival date & time 09/06/14  1357 History   First MD Initiated Contact with Patient 09/06/14 1400     Chief Complaint  Patient presents with  . Abdominal Pain    HPI Patient reports lower abdominal pain and right inguinal pain over the past 3 weeks with some associated nausea and vomiting.  He has a known right inguinal hernia that was to be repaired by Dr. Zella Richer after cardiac clearance however cardiac clearance was never had.  It sounds like there was a communication breakdown at some point.  He denies fevers or chills.  No urinary complaints.  Denies back pain.  Denies upper abdominal pain.  Reports pain and swelling in his right groin and scrotal region   Past Medical History  Diagnosis Date  . Hypertension   . Prostate ca   . Hepatitis C   . Hyperlipidemia    Past Surgical History  Procedure Laterality Date  . Appendectomy    . Small intestine surgery    . Multiple abdominal surgeries    . Prostatectomy     Family History  Problem Relation Age of Onset  . Alzheimer's disease Father   . Cancer Mother     Lymph node  . Heart failure Brother 45    Transplant 13 years ago  . CAD Brother   . Heart disease Sister 71    MI   History  Substance Use Topics  . Smoking status: Current Every Day Smoker -- 1.00 packs/day for 35 years    Types: Cigarettes  . Smokeless tobacco: Never Used  . Alcohol Use: 0.0 oz/week     Comment: last drink 2 weeks ago    Review of Systems  All other systems reviewed and are negative.     Allergies  Penicillins and Sudafed  Home Medications   Prior to Admission medications   Medication Sig Start Date End Date Taking? Authorizing Provider  Blood Pressure Monitoring (B-D ASSURE BPM/AUTO ARM CUFF) MISC 1 each by Does not apply route once. 04/10/14  Yes Minus Breeding, MD  amLODipine (NORVASC) 10 MG tablet Take 10 mg by mouth daily.     Historical Provider, MD  Cholecalciferol (VITAMIN D3) 2000 UNITS TABS Take  2,000 Units by mouth daily.    Historical Provider, MD  cloNIDine (CATAPRES) 0.2 MG tablet Take 0.2 mg by mouth 2 (two) times daily.    Historical Provider, MD  ferrous sulfate 325 (65 FE) MG tablet Take 325 mg by mouth 2 (two) times daily with a meal.    Historical Provider, MD  lisinopril (PRINIVIL,ZESTRIL) 10 MG tablet Take 10 mg by mouth daily.  12/12/13   Historical Provider, MD  metoprolol tartrate (LOPRESSOR) 25 MG tablet Take 25 mg by mouth daily.     Historical Provider, MD  naproxen sodium (ALEVE) 220 MG tablet Take 1,100 mg by mouth 2 (two) times daily as needed (severe pain).    Historical Provider, MD  oxybutynin (DITROPAN XL) 15 MG 24 hr tablet Take 15 mg by mouth daily.    Historical Provider, MD  oxyCODONE (ROXICODONE) 5 MG immediate release tablet Take 1 tablet (5 mg total) by mouth every 4 (four) hours as needed for severe pain. 04/03/14   Ephraim Hamburger, MD  oxyCODONE-acetaminophen (PERCOCET) 10-325 MG per tablet Take 1 tablet by mouth every 4 (four) hours as needed for pain.    Historical Provider, MD   BP 206/103 mmHg  Pulse 76  Temp(Src) 98.2 F (  36.8 C) (Oral)  Resp 16  SpO2 97% Physical Exam  Constitutional: He is oriented to person, place, and time. He appears well-developed and well-nourished.  HENT:  Head: Normocephalic and atraumatic.  Eyes: EOM are normal.  Neck: Normal range of motion.  Cardiovascular: Normal rate, regular rhythm, normal heart sounds and intact distal pulses.   Pulmonary/Chest: Effort normal and breath sounds normal. No respiratory distress.  Abdominal: Soft. He exhibits no distension. There is no tenderness.  Musculoskeletal: Normal range of motion.  Neurological: He is alert and oriented to person, place, and time.  Skin: Skin is warm and dry.  Psychiatric: He has a normal mood and affect. Judgment normal.  Nursing note and vitals reviewed.   ED Course  Hernia reduction Date/Time: 09/06/2014 4:55 PM Performed by: Hoy Morn Authorized by: Hoy Morn Consent: Verbal consent obtained. Risks and benefits: risks, benefits and alternatives were discussed Consent given by: patient Required items: required blood products, implants, devices, and special equipment available Time out: Immediately prior to procedure a "time out" was called to verify the correct patient, procedure, equipment, support staff and site/side marked as required. Patient sedated: no Patient tolerance: Patient tolerated the procedure well with no immediate complications Comments: Felt a good pop and felt as though complete reduction was met   (including critical care time) Labs Review Labs Reviewed  CBC WITH DIFFERENTIAL/PLATELET - Abnormal; Notable for the following:    Hemoglobin 11.2 (*)    HCT 37.2 (*)    MCH 23.5 (*)    RDW 16.1 (*)    All other components within normal limits  COMPREHENSIVE METABOLIC PANEL - Abnormal; Notable for the following:    GFR calc non Af Amer 89 (*)    All other components within normal limits  LIPASE, BLOOD  I-STAT TROPOININ, ED    Imaging Review Ct Abdomen Pelvis W Contrast  09/06/2014   CLINICAL DATA:  Right lower quadrant pain for 3 weeks  EXAM: CT ABDOMEN AND PELVIS WITH CONTRAST  TECHNIQUE: Multidetector CT imaging of the abdomen and pelvis was performed using the standard protocol following bolus administration of intravenous contrast.  CONTRAST:  27mL OMNIPAQUE IOHEXOL 300 MG/ML SOLN, 167mL OMNIPAQUE IOHEXOL 300 MG/ML SOLN  COMPARISON:  01/27/2012  FINDINGS: Lower chest: The lung bases appear clear. No pleural or pericardial effusion.  Hepatobiliary: No suspicious liver abnormality. The gallbladder is normal. No biliary dilatation.  Pancreas: Negative  Spleen: Negative  Adrenals/Urinary Tract: The adrenal glands are normal. Normal appearance of the right kidney. Cyst within the upper pole of the left kidney measures 1.1 cm. Several small low-attenuation foci are also noted in the left kidney.  These are too small to characterize. Urinary bladder appears normal.  Stomach/Bowel: Large hiatal hernia noted. The proximal small bowel loops have a normal course and caliber. There is a a right lower quadrant inguinal hernia which contains a loop of small bowel. The segment small bowel gest proximal to the hernia is abnormal appearance. This is increased in caliber measuring up to 3 cm and there is abnormal wall thickening and surrounding fat stranding. The terminal ileum and colon appear normal.  Vascular/Lymphatic: Calcified atherosclerotic disease involves the abdominal aorta. No aneurysm. No enlarged retroperitoneal or mesenteric adenopathy. No enlarged pelvic or inguinal lymph nodes.  Reproductive: The prostate gland is enlarged. Symmetric appearance of the seminal vesicles.  Other: No free fluid or fluid collections identified. No pneumatosis identified. There is no free intraperitoneal air.  Musculoskeletal: There is multi level  degenerative disc disease within the lumbar spine. Most advanced at L5-S1.  IMPRESSION: 1. Right inguinal hernia containing a loop of small bowel. The segment of bowel just proximal to the hernia appears partially obstructed and exhibits wall thickening and surrounding fat stranding. Cannot exclude vascular compromise to this loop of bowel and surgical consultation is recommended. At this time there is no evidence for pneumatosis, portal venous gas or bowel perforation. 2. Atherosclerosis.   Electronically Signed   By: Kerby Moors M.D.   On: 09/06/2014 16:34  I personally reviewed the imaging tests through PACS system I reviewed available ER/hospitalization records through the EMR    EKG Interpretation   Date/Time:  Friday September 06 2014 14:52:39 EST Ventricular Rate:  68 PR Interval:  220 QRS Duration: 102 QT Interval:  464 QTC Calculation: 493 R Axis:   -36 Text Interpretation:  Sinus rhythm Prolonged PR interval Abnormal R-wave  progression, early transition  Left ventricular hypertrophy Nonspecific T  abnormalities, lateral leads Borderline prolonged QT interval nonspecific  changes as compared to prior ecg Confirmed by Ashlee Bewley  MD, Lennette Bihari ([id number]) on  09/06/2014 3:05:55 PM      MDM   Final diagnoses:  None    4:56 PM Feels though a good reduction was had.  This was completed after the CT scan given the CT findings.  The patient stands back up his inguinal hernia returns and cause him pain again.  This can then be reduced once again although it does take some force to reduce it again.  I've asked Dr. Lucia Gaskins of general surgery to common pressure on his right inguinal region to make sure he feels confident that this thing is completely reduced as well.  I think if this thing is reduced he is much better to follow-up as an outpatient have his inguinal hernia repaired after appropriate cardiac clearance and outpatient workup.    Hoy Morn, MD 09/06/14 [phone]

## 2014-09-06 NOTE — ED Notes (Signed)
Pt reports RLQ abd pain for past month, that has gradually gotten worse. Pt states he has been having emesis and diarrhea at home. Last episode of emesis yesterday, and last episode of diarrhea last night.

## 2014-09-06 NOTE — ED Notes (Signed)
Pt transported to CT; will administer pain medication with pt return.

## 2014-09-06 NOTE — ED Provider Notes (Signed)
Pt seen by Dr. Lucia Gaskins, hernia reduced. Will d/c home, outpt gen surg f/u recommended.   1. Right inguinal hernia   2. H/O medication noncompliance      Ernestina Patches, MD 09/06/14 [phone]

## 2014-09-18 ENCOUNTER — Encounter: Payer: Self-pay | Admitting: Cardiology

## 2014-09-18 ENCOUNTER — Ambulatory Visit (INDEPENDENT_AMBULATORY_CARE_PROVIDER_SITE_OTHER): Payer: Medicare Other | Admitting: Cardiology

## 2014-09-18 VITALS — BP 172/78 | HR 72 | Ht 69.0 in | Wt 220.0 lb

## 2014-09-18 DIAGNOSIS — R0600 Dyspnea, unspecified: Secondary | ICD-10-CM

## 2014-09-18 DIAGNOSIS — I10 Essential (primary) hypertension: Secondary | ICD-10-CM

## 2014-09-18 MED ORDER — AMLODIPINE BESYLATE 5 MG PO TABS: 5.0000 mg | ORAL_TABLET | Freq: Every day | ORAL | Status: DC

## 2014-09-18 NOTE — Patient Instructions (Signed)
Your physician recommends that you schedule a follow-up appointment in: after all your test are completed  Start taking norvasc 5 mg daily  We have ordered a stress test for you to get done  We have ordered a sleep study for you to get done

## 2014-09-18 NOTE — Progress Notes (Signed)
HPI The patient presents for evaluation and management of multiple issues including dyspnea and difficult to control hypertension.  After the last visit he was supposed to have a stress perfusion study to evaluate dyspnea. He was a no-show for this. He also did not comply suggestions for a sleep study. He was seen in the emergency room recently because of his hernia. He's given a belt to wear for this. He wants to have surgery but he understands the stress test is indicated first. He was sent back then to discuss this. He has his baseline dyspnea as described previously. He's mostly bothered by pain some in his groin and some in his joints.  He has a cough occasionally productive. He still smoke cigarettes.   Allergies  Allergen Reactions  . Penicillins Anaphylaxis  . Sudafed [Pseudoephedrine] Anaphylaxis    Current Outpatient Prescriptions  Medication Sig Dispense Refill  . lisinopril (PRINIVIL,ZESTRIL) 20 MG tablet Take 20 mg by mouth.    . naproxen sodium (ALEVE) 220 MG tablet Take 660 mg by mouth 2 (two) times daily as needed (severe pain).     . ondansetron (ZOFRAN ODT) 8 MG disintegrating tablet Take 1 tablet (8 mg total) by mouth every 8 (eight) hours as needed for nausea or vomiting. 10 tablet 0  . oxyCODONE-acetaminophen (PERCOCET/ROXICET) 5-325 MG per tablet Take 1 tablet by mouth every 4 (four) hours as needed for severe pain. 20 tablet 0  . zolpidem (AMBIEN) 5 MG tablet Take 5 mg by mouth.     No current facility-administered medications for this visit.    Past Medical History  Diagnosis Date  . Hypertension   . Prostate ca   . Hepatitis C   . Hyperlipidemia     Past Surgical History  Procedure Laterality Date  . Appendectomy    . Small intestine surgery    . Multiple abdominal surgeries    . Prostatectomy       ROS:   Leg pain.   Otherwise as stated in the HPI and negative for all other systems.  PHYSICAL EXAM BP 172/78 mmHg  Pulse 72  Ht 5\' 9"  (1.753 m)   Wt 220 lb (99.791 kg)  BMI 32.47 kg/m2 GENERAL:  Well appearing NECK:  No jugular venous distention, waveform within normal limits, carotid upstroke brisk and symmetric, no bruits, no thyromegaly LUNGS:  Clear to auscultation bilaterally CHEST:  Unremarkable HEART:  PMI not displaced or sustained,S1 and S2 within normal limits, no S3, no S4, no clicks, no rubs, soft apical systolic murmur radiating slightly out the aortic outflow tract, no diastolic murmurs ABD:  Flat, positive bowel sounds normal in frequency in pitch, no bruits, no rebound, no guarding, no midline pulsatile mass, no hepatomegaly, no splenomegaly, multiple surgical wounds healed EXT:  2 plus pulses throughout, no edema, no cyanosis no clubbing   ASSESSMENT AND PLAN  PREOP:  Given his dyspnea and his risk factors he needs stress testing. He would be a walk on a treadmill. We will again try to schedule the stress test but he understands he needs to comply with this prior to my approval for surgery.  DYSPNEA:  He needs stress testing but he could not walk a treadmill. Therefore, he will have a [employer].  AAA:  He had some mild abdominal aortic ectasia. This can be followed over time with serial ultrasounds as indicated. He does need risk reduction.  TOBACCO ABUSE:  He understands the need to quit smoking. He has been educated.  HTN:  He did not keep his blood pressure diary as requested. However, his blood pressure has been elevated twice.  SLEEP APNEA:  I suspect he has this. We will again try to schedule for a sleep study. This may be contributing to his difficult to control hypertension.

## 2014-09-20 ENCOUNTER — Telehealth (HOSPITAL_COMMUNITY): Payer: Self-pay

## 2014-09-20 NOTE — Telephone Encounter (Signed)
Encounter complete. 

## 2014-09-24 ENCOUNTER — Telehealth (HOSPITAL_COMMUNITY): Payer: Self-pay

## 2014-09-24 NOTE — Telephone Encounter (Signed)
Encounter complete. 

## 2014-09-25 ENCOUNTER — Ambulatory Visit (HOSPITAL_COMMUNITY)
Admission: RE | Admit: 2014-09-25 | Discharge: 2014-09-25 | Disposition: A | Discharge status: Home / Self Care | Payer: Medicare Other | Source: Ambulatory Visit | Attending: Cardiology | Admitting: Cardiology

## 2014-09-25 DIAGNOSIS — R002 Palpitations: Secondary | ICD-10-CM | POA: Insufficient documentation

## 2014-09-25 DIAGNOSIS — R42 Dizziness and giddiness: Secondary | ICD-10-CM | POA: Diagnosis not present

## 2014-09-25 DIAGNOSIS — F17200 Nicotine dependence, unspecified, uncomplicated: Secondary | ICD-10-CM | POA: Diagnosis not present

## 2014-09-25 DIAGNOSIS — R5383 Other fatigue: Secondary | ICD-10-CM | POA: Insufficient documentation

## 2014-09-25 DIAGNOSIS — Z8249 Family history of ischemic heart disease and other diseases of the circulatory system: Secondary | ICD-10-CM | POA: Diagnosis not present

## 2014-09-25 DIAGNOSIS — R0600 Dyspnea, unspecified: Secondary | ICD-10-CM

## 2014-09-25 DIAGNOSIS — E663 Overweight: Secondary | ICD-10-CM | POA: Insufficient documentation

## 2014-09-25 DIAGNOSIS — R0602 Shortness of breath: Secondary | ICD-10-CM | POA: Diagnosis not present

## 2014-09-25 DIAGNOSIS — I10 Essential (primary) hypertension: Secondary | ICD-10-CM | POA: Insufficient documentation

## 2014-09-25 DIAGNOSIS — E785 Hyperlipidemia, unspecified: Secondary | ICD-10-CM | POA: Insufficient documentation

## 2014-09-25 DIAGNOSIS — Z01810 Encounter for preprocedural cardiovascular examination: Secondary | ICD-10-CM | POA: Diagnosis not present

## 2014-09-25 MED ORDER — TECHNETIUM TC 99M SESTAMIBI GENERIC - CARDIOLITE
10.2000 | Freq: Once | INTRAVENOUS | Status: AC | PRN
  Administered 2014-09-25: 10 via INTRAVENOUS

## 2014-09-25 MED ORDER — TECHNETIUM TC 99M SESTAMIBI GENERIC - CARDIOLITE
30.4000 | Freq: Once | INTRAVENOUS | Status: AC | PRN
  Administered 2014-09-25: 30 via INTRAVENOUS

## 2014-09-25 MED ORDER — REGADENOSON 0.4 MG/5ML IV SOLN
0.4000 mg | Freq: Once | INTRAVENOUS | Status: AC
  Administered 2014-09-25: 0.4 mg via INTRAVENOUS

## 2014-09-25 NOTE — Procedures (Addendum)
[facility] [address] [place] [address] [phone]  Cardiology Nuclear Med Study  [name] is a 64 y.o. male     MRN : [id number]     DOB: [birth year]  Procedure Date: 09/25/2014  Nuclear Med Background Indication for Stress Test:  Evaluation for Ischemia, Surgical Clearance and [facility] History:  No prior cardiac or respiratory history reported;No prior NUC MPI for comparison;HEP C Cardiac Risk Factors: Family History - CAD, Hypertension, Lipids, Overweight and Smoker  Symptoms:  Dizziness, Fatigue, Palpitations and SOB   Nuclear Pre-Procedure Caffeine/Decaff Intake:  1:30am NPO After: 8:30am   IV Site: R Forearm  IV 0.9% NS with Angio Cath:  22g  Chest Size (in):  44"  IV Started by: Rolene Course, RN  Height: 5\' 9"  (1.753 m)  Cup Size: n/a  BMI:  Body mass index is 32.47 kg/(m^2). Weight:  220 lb (99.791 kg)   Tech Comments:  n/a    Nuclear Med Study 1 or 2 day study: 1 day  Stress Test Type:  [place] Provider:  Minus Breeding, Md   Resting Radionuclide: Technetium 28m Sestamibi  Resting Radionuclide Dose: 10.2 mCi   Stress Radionuclide:  Technetium 35m Sestamibi  Stress Radionuclide Dose: 30.4 mCi           Stress Protocol Rest HR: 70 Stress HR: 80  Rest BP: 189/109 Stress BP: 204/110  Exercise Time (min): n/a METS: n/a          Dose of Adenosine (mg):  n/a Dose of Lexiscan: 0.4 mg  Dose of Atropine (mg): n/a Dose of Dobutamine: n/a mcg/kg/min (at max HR)  Stress Test Technologist: Leane Para, CCT Nuclear Technologist: Anthony Sar   Rest Procedure:  Myocardial perfusion imaging was performed at rest 45 minutes following the intravenous administration of Technetium 34m Sestamibi. Stress Procedure:  The patient received IV Lexiscan 0.4 mg over 15-seconds.  Technetium 19m Sestamibi injected IV at 30-seconds.  There were no significant changes with  Lexiscan.  Quantitative spect images were obtained after a 45 minute delay.  Transient Ischemic Dilatation (Normal <1.22): 1.16  QGS EDV:  177 ml QGS ESV:  97 ml LV Ejection Fraction: 45%  PHYSICIAN INTERPRETATION  Rest ECG: NSR with non-specific ST-T wave changes  Stress ECG: No significant change from baseline ECG and No significant ST segment change suggestive of ischemia.  QPS Raw Data Images:  Mild diaphragmatic attenuation.  Normal left ventricular size. Stress Images:  There is decreased uptake in the inferior wall. Rest Images:  There is decreased uptake in the inferior wall. Subtraction (SDS):  There is a fixed inferior defect that is most consistent with diaphragmatic attenuation.  No reversibility is appreciated.  There is no evidence of scar or ischemia.   Impression Exercise Capacity:  Lexiscan with no exercise. BP Response:  Hypertensive blood pressure response. Clinical Symptoms:  There is dyspnea. ECG Impression:  No significant ECG changes with Lexiscan. Comparison with Prior Nuclear Study: No previous nuclear study performed  Overall Impression:  Low risk stress nuclear study with a fixed mild mid to apical inferior defect consistent with diaphragmatic attenuation.  LV Wall Motion:  NL LV Function; NL Wall Motion   Leonie Man, MD  09/25/2014 5:28 PM

## 2014-10-30 ENCOUNTER — Encounter: Payer: Self-pay | Admitting: General Surgery

## 2014-10-30 ENCOUNTER — Encounter (HOSPITAL_COMMUNITY): Payer: Self-pay

## 2014-10-30 ENCOUNTER — Encounter (HOSPITAL_COMMUNITY)
Admission: RE | Admit: 2014-10-30 | Discharge: 2014-10-30 | Disposition: A | Discharge status: Home / Self Care | Payer: Medicare Other | Source: Ambulatory Visit | Attending: General Surgery | Admitting: General Surgery

## 2014-10-30 DIAGNOSIS — K4090 Unilateral inguinal hernia, without obstruction or gangrene, not specified as recurrent: Secondary | ICD-10-CM | POA: Diagnosis not present

## 2014-10-30 HISTORY — DX: Headache: R51

## 2014-10-30 HISTORY — DX: Other specified postprocedural states: R11.2

## 2014-10-30 HISTORY — DX: Reserved for inherently not codable concepts without codable children: IMO0001

## 2014-10-30 HISTORY — DX: Nocturia: R35.1

## 2014-10-30 HISTORY — DX: Gastro-esophageal reflux disease without esophagitis: K21.9

## 2014-10-30 HISTORY — DX: Pain in unspecified joint: M25.50

## 2014-10-30 HISTORY — DX: Dorsalgia, unspecified: M54.9

## 2014-10-30 HISTORY — DX: Other specified postprocedural states: Z98.890

## 2014-10-30 HISTORY — DX: Frequency of micturition: R35.0

## 2014-10-30 HISTORY — DX: Anesthesia of skin: R20.0

## 2014-10-30 HISTORY — DX: Urgency of urination: R39.15

## 2014-10-30 HISTORY — DX: Other chronic pain: G89.29

## 2014-10-30 HISTORY — DX: Unspecified osteoarthritis, unspecified site: M19.90

## 2014-10-30 HISTORY — DX: Insomnia, unspecified: G47.00

## 2014-10-30 HISTORY — DX: Personal history of other medical treatment: Z92.89

## 2014-10-30 HISTORY — DX: Personal history of other diseases of the musculoskeletal system and connective tissue: Z87.39

## 2014-10-30 HISTORY — DX: Headache, unspecified: R51.9

## 2014-10-30 HISTORY — DX: Unspecified glaucoma: H40.9

## 2014-10-30 LAB — CBC
HCT: 42.2 % (ref 39.0–52.0)
Hemoglobin: 12.8 g/dL — ABNORMAL LOW (ref 13.0–17.0)
MCH: 23.8 pg — ABNORMAL LOW (ref 26.0–34.0)
MCHC: 30.3 g/dL (ref 30.0–36.0)
MCV: 78.6 fL (ref 78.0–100.0)
Platelets: 320 10*3/uL (ref 150–400)
RBC: 5.37 MIL/uL (ref 4.22–5.81)
RDW: 16.4 % — ABNORMAL HIGH (ref 11.5–15.5)
WBC: 4.9 10*3/uL (ref 4.0–10.5)

## 2014-10-30 LAB — COMPREHENSIVE METABOLIC PANEL
ALT: 19 U/L (ref 0–53)
AST: 21 U/L (ref 0–37)
Albumin: 4.1 g/dL (ref 3.5–5.2)
Alkaline Phosphatase: 129 U/L — ABNORMAL HIGH (ref 39–117)
Anion gap: 7 (ref 5–15)
BUN: 12 mg/dL (ref 6–23)
CO2: 27 mmol/L (ref 19–32)
Calcium: 9.4 mg/dL (ref 8.4–10.5)
Chloride: 105 mmol/L (ref 96–112)
Creatinine, Ser: 1.03 mg/dL (ref 0.50–1.35)
GFR calc Af Amer: 86 mL/min — ABNORMAL LOW (ref >90)
GFR calc non Af Amer: 74 mL/min — ABNORMAL LOW (ref >90)
Glucose, Bld: 94 mg/dL (ref 70–99)
Potassium: 4.3 mmol/L (ref 3.5–5.1)
Sodium: 139 mmol/L (ref 135–145)
Total Bilirubin: 0.6 mg/dL (ref 0.3–1.2)
Total Protein: 7.7 g/dL (ref 6.0–8.3)

## 2014-10-30 NOTE — Pre-Procedure Instructions (Signed)
SELSO MANNOR  10/30/2014   Your procedure is scheduled on:  Mon, April 18 @ 1:30 PM  Report to [facility]  at 11:30 AM.  Call this number if you have problems the morning of surgery: [phone]   Remember:   Do not eat food or drink liquids after midnight.   Take these medicines the morning of surgery with A SIP OF WATER: Amlodipine(Norvasc),Zofran(Ondansetron-if needed),and Pain Pill(if needed)               Stop taking your Aleve. No Goody's,BC's,Aspirin,Ibuprofen,Fish Oil,or any Herbal Medications.    Do not wear jewelry.  Do not wear lotions, powders, or colognes.You may wear deoderant.  Men may shave face and neck.  Do not bring valuables to the hospital.  [facility] is not responsible                  for any belongings or valuables.               Contacts, dentures or bridgework may not be worn into surgery.  Leave suitcase in the car. After surgery it may be brought to your room.  For patients admitted to the hospital, discharge time is determined by your                treatment team.               Patients discharged the day of surgery will not be allowed to drive  home.    Special Instructions:  [facility] - Preparing for Surgery  Before surgery, you can play an important role.  Because skin is not sterile, your skin needs to be as free of germs as possible.  You can reduce the number of germs on you skin by washing with CHG (chlorahexidine gluconate) soap before surgery.  CHG is an antiseptic cleaner which kills germs and bonds with the skin to continue killing germs even after washing.  Please DO NOT use if you have an allergy to CHG or antibacterial soaps.  If your skin becomes reddened/irritated stop using the CHG and inform your nurse when you arrive at Short Stay.  Do not shave (including legs and underarms) for at least 48 hours prior to the first CHG shower.  You may shave your face.  Please follow these instructions carefully:   1.  Shower  with CHG Soap the night before surgery and the                                morning of Surgery.  2.  If you choose to wash your hair, wash your hair first as usual with your       normal shampoo.  3.  After you shampoo, rinse your hair and body thoroughly to remove the                      Shampoo.  4.  Use CHG as you would any other liquid soap.  You can apply chg directly       to the skin and wash gently with scrungie or a clean washcloth.  5.  Apply the CHG Soap to your body ONLY FROM THE NECK DOWN.        Do not use on open wounds or open sores.  Avoid contact with your eyes,       ears, mouth and genitals (private parts).  Wash genitals (private parts)       with your normal soap.  6.  Wash thoroughly, paying special attention to the area where your surgery        will be performed.  7.  Thoroughly rinse your body with warm water from the neck down.  8.  DO NOT shower/wash with your normal soap after using and rinsing off       the CHG Soap.  9.  Pat yourself dry with a clean towel.            10.  Wear clean pajamas.            11.  Place clean sheets on your bed the night of your first shower and do not        sleep with pets.  Day of Surgery  Do not apply any lotions/deoderants the morning of surgery.  Please wear clean clothes to the hospital/surgery center.     Please read over the following fact sheets that you were given: Pain Booklet, Coughing and Deep Breathing and Surgical Site Infection Prevention

## 2014-10-30 NOTE — Progress Notes (Signed)
   10/30/14 0949  OBSTRUCTIVE SLEEP APNEA  Have you ever been diagnosed with sleep apnea through a sleep study? No  Do you snore loudly (loud enough to be heard through closed doors)?  1  Do you often feel tired, fatigued, or sleepy during the daytime? 0  Has anyone observed you stop breathing during your sleep? 1  Do you have, or are you being treated for high blood pressure? 1  BMI more than 35 kg/m2? 0  Age over 66 years old? 1  Neck circumference greater than 40 cm/16 inches? 1  Gender: 1

## 2014-10-30 NOTE — Progress Notes (Signed)
Spoke with Sharyn Lull at [employer] office  requesting orders to be put into epic

## 2014-10-30 NOTE — Progress Notes (Signed)
Patient ID: [name], male   DOB: [birth year], 64 y.o.   MRN: [id number] [name] 10/03/2014 1:42 PM Location: [facility] Patient #: [id number] DOB: 09-Sep-1950 Separated / Language: [name] / Race: Black or African American Male  History of Present Illness Jonathon Hollingshead MD; 10/03/2014 3:20 PM) Patient words: RIH.  The patient is a 64 year old male   Note:He is here to schedule his right inguinal hernia repair. He had his stress test done 8 days ago and it was a low risk type stress test. The hernia is getting larger and more painful and now he wants to schedule repair. He is voiding okay for the most part. He has been seen by a urologist at the [facility] clinic and needs to have a follow-up appointment with him. He states they found a small area on his prostate gland.   Allergies (Sonya Bynum, CMA; 10/03/2014 1:43 PM) Penicillamine *ASSORTED CLASSES*  Medication History (Sonya Bynum, CMA; 10/03/2014 1:43 PM) Norvasc (10MG  Tablet, Oral daily) Active. Vitamin D (400UNIT Capsule, Oral four times daily, as needed) Active. Ferrous Sulfate (daily) Active. Gabapentin Active. Gabapentin (100MG  Capsule, Oral daily) Active. Hydrochlorothiazide (12.5MG  Tablet, Oral daily) Active. Lisinopril-Hydrochlorothiazide (20-12.5MG  Tablet, Oral daily) Active. Metoprolol Succinate ER (25MG  Tablet ER 24HR, Oral daily) Active. Medications Reconciled  Vitals (Sonya Bynum CMA; 10/03/2014 1:43 PM) 10/03/2014 1:43 PM Weight: 216 lb Height: 69in Body Surface Area: 2.18 m Body Mass Index: 31.9 kg/m Temp.: 97.56F(Temporal)  Pulse: 86 (Regular)  BP: 138/92 (Sitting, Left Arm, Standard)    Physical Exam Jonathon Hollingshead MD; 10/03/2014 3:21 PM) The physical exam findings are as follows: Note:General: WDWN in NAD. Pleasant and cooperative.  CV: RRR, no murmur, no JVD.  CHEST: Breath sounds equal and clear. Respirations  nonlabored.  ABDOMEN: Soft, there is a wide midline abdominal scar present.  GU: There is a moderate to large sized right inguinal bulge that is reducible. Left inguinal floor is solid.  NEUROLOGIC: Alert and oriented, answers questions appropriately, normal gait and station.  PSYCHIATRIC: Normal mood, affect , and behavior.    Assessment & Plan Jonathon Hollingshead MD; 10/03/2014 3:22 PM) HERNIA, INGUINAL, RIGHT (550.90  K40.90) Impression: Stress test is low risk. He is ready to schedule repair.  Plan: Open right inguinal hernia repair mesh. Procedure, risks, and after care had been discussed with him previously.  Jackolyn Confer, MD

## 2014-10-30 NOTE — Progress Notes (Addendum)
Cardiologist is Dr.Hochrein with last visit in epic from 09-18-14  EKG in epic from 09-06-14  Echo report in epic from 2007  Stress test in epic from 09-25-14  Springer NP with [facility](in process of changing)  Denies CXR in past yr  Denies ever having a Heart cath

## 2014-11-01 NOTE — Progress Notes (Signed)
Pt. States that he is aware of time change for surgery. To arrive @0930 .

## 2014-11-03 MED ORDER — VANCOMYCIN HCL IN DEXTROSE 1-5 GM/200ML-% IV SOLN
1000.0000 mg | INTRAVENOUS | Status: AC
  Administered 2014-11-04: 1000 mg via INTRAVENOUS
  Filled 2014-11-03: qty 200

## 2014-11-04 ENCOUNTER — Ambulatory Visit (HOSPITAL_COMMUNITY): Payer: Medicare Other | Admitting: Certified Registered"

## 2014-11-04 ENCOUNTER — Encounter (HOSPITAL_COMMUNITY): Payer: Self-pay | Admitting: *Deleted

## 2014-11-04 ENCOUNTER — Ambulatory Visit (HOSPITAL_COMMUNITY)
Admission: RE | Admit: 2014-11-04 | Discharge: 2014-11-04 | Disposition: A | Discharge status: Home / Self Care | Payer: Medicare Other | Source: Ambulatory Visit | Attending: General Surgery | Admitting: General Surgery

## 2014-11-04 ENCOUNTER — Encounter (HOSPITAL_COMMUNITY)
Admission: RE | Disposition: A | Discharge status: Home / Self Care | Payer: Self-pay | Source: Ambulatory Visit | Attending: General Surgery

## 2014-11-04 DIAGNOSIS — K219 Gastro-esophageal reflux disease without esophagitis: Secondary | ICD-10-CM | POA: Insufficient documentation

## 2014-11-04 DIAGNOSIS — D215 Benign neoplasm of connective and other soft tissue of pelvis: Secondary | ICD-10-CM | POA: Diagnosis not present

## 2014-11-04 DIAGNOSIS — K4090 Unilateral inguinal hernia, without obstruction or gangrene, not specified as recurrent: Secondary | ICD-10-CM | POA: Insufficient documentation

## 2014-11-04 DIAGNOSIS — Z8619 Personal history of other infectious and parasitic diseases: Secondary | ICD-10-CM | POA: Insufficient documentation

## 2014-11-04 DIAGNOSIS — Z880 Allergy status to penicillin: Secondary | ICD-10-CM | POA: Insufficient documentation

## 2014-11-04 DIAGNOSIS — R0602 Shortness of breath: Secondary | ICD-10-CM | POA: Insufficient documentation

## 2014-11-04 DIAGNOSIS — F17210 Nicotine dependence, cigarettes, uncomplicated: Secondary | ICD-10-CM | POA: Insufficient documentation

## 2014-11-04 HISTORY — PX: INGUINAL HERNIA REPAIR: SHX194

## 2014-11-04 HISTORY — PX: MASS EXCISION: SHX2000

## 2014-11-04 LAB — CBC WITH DIFFERENTIAL/PLATELET
Basophils Absolute: 0 10*3/uL (ref 0.0–0.1)
Basophils Relative: 0 % (ref 0–1)
Eosinophils Absolute: 0.1 10*3/uL (ref 0.0–0.7)
Eosinophils Relative: 1 % (ref 0–5)
HCT: 39.3 % (ref 39.0–52.0)
Hemoglobin: 12.1 g/dL — ABNORMAL LOW (ref 13.0–17.0)
Lymphocytes Relative: 33 % (ref 12–46)
Lymphs Abs: 1.7 10*3/uL (ref 0.7–4.0)
MCH: 23.8 pg — ABNORMAL LOW (ref 26.0–34.0)
MCHC: 30.8 g/dL (ref 30.0–36.0)
MCV: 77.2 fL — ABNORMAL LOW (ref 78.0–100.0)
Monocytes Absolute: 0.5 10*3/uL (ref 0.1–1.0)
Monocytes Relative: 9 % (ref 3–12)
Neutro Abs: 2.9 10*3/uL (ref 1.7–7.7)
Neutrophils Relative %: 57 % (ref 43–77)
Platelets: 299 10*3/uL (ref 150–400)
RBC: 5.09 MIL/uL (ref 4.22–5.81)
RDW: 16.3 % — ABNORMAL HIGH (ref 11.5–15.5)
WBC: 5.2 10*3/uL (ref 4.0–10.5)

## 2014-11-04 LAB — PROTIME-INR
INR: 1.37 (ref 0.00–1.49)
Prothrombin Time: 17 seconds — ABNORMAL HIGH (ref 11.6–15.2)

## 2014-11-04 SURGERY — REPAIR, HERNIA, INGUINAL, ADULT
Anesthesia: General | Site: Groin | Laterality: Right

## 2014-11-04 MED ORDER — ONDANSETRON HCL 4 MG/2ML IJ SOLN
INTRAMUSCULAR | Status: DC | PRN
  Administered 2014-11-04: 4 mg via INTRAVENOUS

## 2014-11-04 MED ORDER — PROPOFOL 10 MG/ML IV BOLUS
INTRAVENOUS | Status: AC
  Filled 2014-11-04: qty 20

## 2014-11-04 MED ORDER — BUPIVACAINE-EPINEPHRINE (PF) 0.5% -1:200000 IJ SOLN
INTRAMUSCULAR | Status: AC
  Filled 2014-11-04: qty 30

## 2014-11-04 MED ORDER — MIDAZOLAM HCL 2 MG/2ML IJ SOLN
INTRAMUSCULAR | Status: AC
  Administered 2014-11-04: 2 mg via INTRAVENOUS
  Filled 2014-11-04: qty 2

## 2014-11-04 MED ORDER — OXYCODONE HCL 5 MG PO TABS
ORAL_TABLET | ORAL | Status: AC
  Filled 2014-11-04: qty 1

## 2014-11-04 MED ORDER — FENTANYL CITRATE (PF) 100 MCG/2ML IJ SOLN
INTRAMUSCULAR | Status: DC | PRN
  Administered 2014-11-04: 50 ug via INTRAVENOUS
  Administered 2014-11-04: 25 ug via INTRAVENOUS
  Administered 2014-11-04: 50 ug via INTRAVENOUS
  Administered 2014-11-04 (×5): 25 ug via INTRAVENOUS

## 2014-11-04 MED ORDER — HYDROMORPHONE HCL 1 MG/ML IJ SOLN
INTRAMUSCULAR | Status: AC
  Filled 2014-11-04: qty 1

## 2014-11-04 MED ORDER — ACETAMINOPHEN 650 MG RE SUPP
650.0000 mg | RECTAL | Status: DC | PRN
  Filled 2014-11-04: qty 1

## 2014-11-04 MED ORDER — DEXAMETHASONE SODIUM PHOSPHATE 10 MG/ML IJ SOLN
INTRAMUSCULAR | Status: DC | PRN
  Administered 2014-11-04: 10 mg via INTRAVENOUS

## 2014-11-04 MED ORDER — ACETAMINOPHEN 325 MG PO TABS
650.0000 mg | ORAL_TABLET | ORAL | Status: DC | PRN
  Filled 2014-11-04: qty 2

## 2014-11-04 MED ORDER — BUPIVACAINE-EPINEPHRINE (PF) 0.5% -1:200000 IJ SOLN
INTRAMUSCULAR | Status: DC | PRN
  Administered 2014-11-04: 20 mL

## 2014-11-04 MED ORDER — ROCURONIUM BROMIDE 50 MG/5ML IV SOLN
INTRAVENOUS | Status: AC
  Filled 2014-11-04: qty 1

## 2014-11-04 MED ORDER — ARTIFICIAL TEARS OP OINT
TOPICAL_OINTMENT | OPHTHALMIC | Status: DC | PRN
  Administered 2014-11-04: 1 via OPHTHALMIC

## 2014-11-04 MED ORDER — OXYCODONE HCL 5 MG PO TABS: 5.0000 mg | ORAL_TABLET | ORAL | Status: DC | PRN

## 2014-11-04 MED ORDER — PROPOFOL 10 MG/ML IV BOLUS
INTRAVENOUS | Status: DC | PRN
  Administered 2014-11-04: 180 mg via INTRAVENOUS

## 2014-11-04 MED ORDER — ARTIFICIAL TEARS OP OINT
TOPICAL_OINTMENT | OPHTHALMIC | Status: AC
  Filled 2014-11-04: qty 3.5

## 2014-11-04 MED ORDER — OXYCODONE HCL 5 MG/5ML PO SOLN: 5.0000 mg | Freq: Once | ORAL | Status: AC | PRN

## 2014-11-04 MED ORDER — SODIUM CHLORIDE 0.9 % IJ SOLN: 3.0000 mL | INTRAMUSCULAR | Status: DC | PRN

## 2014-11-04 MED ORDER — FENTANYL CITRATE (PF) 250 MCG/5ML IJ SOLN
INTRAMUSCULAR | Status: AC
  Filled 2014-11-04: qty 5

## 2014-11-04 MED ORDER — MIDAZOLAM HCL 2 MG/2ML IJ SOLN
INTRAMUSCULAR | Status: AC
  Filled 2014-11-04: qty 2

## 2014-11-04 MED ORDER — DEXAMETHASONE SODIUM PHOSPHATE 10 MG/ML IJ SOLN
INTRAMUSCULAR | Status: AC
  Filled 2014-11-04: qty 1

## 2014-11-04 MED ORDER — LIDOCAINE HCL (CARDIAC) 20 MG/ML IV SOLN
INTRAVENOUS | Status: AC
  Filled 2014-11-04: qty 5

## 2014-11-04 MED ORDER — OXYCODONE HCL 5 MG PO TABS
5.0000 mg | ORAL_TABLET | Freq: Once | ORAL | Status: AC | PRN
  Administered 2014-11-04: 5 mg via ORAL

## 2014-11-04 MED ORDER — HYDROMORPHONE HCL 1 MG/ML IJ SOLN
0.2500 mg | INTRAMUSCULAR | Status: DC | PRN
  Administered 2014-11-04 (×4): 0.5 mg via INTRAVENOUS

## 2014-11-04 MED ORDER — FENTANYL CITRATE (PF) 100 MCG/2ML IJ SOLN
INTRAMUSCULAR | Status: AC
Start: 2014-11-04 — End: 2014-11-04
  Administered 2014-11-04: 100 ug via INTRAVENOUS
  Filled 2014-11-04: qty 2

## 2014-11-04 MED ORDER — LIDOCAINE HCL (CARDIAC) 20 MG/ML IV SOLN
INTRAVENOUS | Status: DC | PRN
  Administered 2014-11-04: 70 mg via INTRAVENOUS

## 2014-11-04 MED ORDER — LACTATED RINGERS IV SOLN
INTRAVENOUS | Status: DC
  Administered 2014-11-04 (×3): via INTRAVENOUS

## 2014-11-04 MED ORDER — GLYCOPYRROLATE 0.2 MG/ML IJ SOLN
INTRAMUSCULAR | Status: DC | PRN
  Administered 2014-11-04 (×2): 0.2 mg via INTRAVENOUS

## 2014-11-04 MED ORDER — 0.9 % SODIUM CHLORIDE (POUR BTL) OPTIME
TOPICAL | Status: DC | PRN
  Administered 2014-11-04: 1000 mL

## 2014-11-04 MED ORDER — MORPHINE SULFATE 2 MG/ML IJ SOLN: 2.0000 mg | INTRAMUSCULAR | Status: DC | PRN

## 2014-11-04 MED ORDER — BUPIVACAINE-EPINEPHRINE 0.5% -1:200000 IJ SOLN
INTRAMUSCULAR | Status: DC | PRN
  Administered 2014-11-04: 17 mL

## 2014-11-04 MED ORDER — PROMETHAZINE HCL 25 MG/ML IJ SOLN: 6.2500 mg | INTRAMUSCULAR | Status: DC | PRN

## 2014-11-04 SURGICAL SUPPLY — 47 items
BENZOIN TINCTURE PRP APPL 2/3 (GAUZE/BANDAGES/DRESSINGS) ×4 IMPLANT
BLADE SURG 10 STRL SS (BLADE) ×4 IMPLANT
BLADE SURG 15 STRL LF DISP TIS (BLADE) ×2 IMPLANT
BLADE SURG 15 STRL SS (BLADE) ×2
BLADE SURG ROTATE 9660 (MISCELLANEOUS) IMPLANT
CHLORAPREP W/TINT 26ML (MISCELLANEOUS) ×4 IMPLANT
CLOSURE WOUND 1/2 X4 (GAUZE/BANDAGES/DRESSINGS) ×1
COVER SURGICAL LIGHT HANDLE (MISCELLANEOUS) ×4 IMPLANT
DRAIN PENROSE 1/2X12 LTX STRL (WOUND CARE) ×4 IMPLANT
DRAPE INCISE IOBAN 66X45 STRL (DRAPES) ×4 IMPLANT
DRAPE LAPAROTOMY TRNSV 102X78 (DRAPE) ×4 IMPLANT
DRAPE UTILITY XL STRL (DRAPES) ×8 IMPLANT
DRSG TEGADERM 4X4.75 (GAUZE/BANDAGES/DRESSINGS) ×4 IMPLANT
DRSG TELFA 3X8 NADH (GAUZE/BANDAGES/DRESSINGS) ×4 IMPLANT
ELECT CAUTERY BLADE 6.4 (BLADE) ×4 IMPLANT
ELECT REM PT RETURN 9FT ADLT (ELECTROSURGICAL) ×4
ELECTRODE REM PT RTRN 9FT ADLT (ELECTROSURGICAL) ×2 IMPLANT
GAUZE SPONGE 4X4 16PLY XRAY LF (GAUZE/BANDAGES/DRESSINGS) ×4 IMPLANT
GLOVE BIOGEL PI IND STRL 7.0 (GLOVE) ×2 IMPLANT
GLOVE BIOGEL PI IND STRL 8 (GLOVE) ×2 IMPLANT
GLOVE BIOGEL PI INDICATOR 7.0 (GLOVE) ×2
GLOVE BIOGEL PI INDICATOR 8 (GLOVE) ×2
GLOVE ECLIPSE 8.0 STRL XLNG CF (GLOVE) ×4 IMPLANT
GLOVE SURG SS PI 7.0 STRL IVOR (GLOVE) ×4 IMPLANT
GOWN STRL REUS W/ TWL LRG LVL3 (GOWN DISPOSABLE) ×4 IMPLANT
GOWN STRL REUS W/TWL LRG LVL3 (GOWN DISPOSABLE) ×4
KIT BASIN OR (CUSTOM PROCEDURE TRAY) ×4 IMPLANT
KIT ROOM TURNOVER OR (KITS) ×4 IMPLANT
MESH HERNIA 3X6 (Mesh General) ×4 IMPLANT
NEEDLE HYPO 25GX1X1/2 BEV (NEEDLE) ×4 IMPLANT
NS IRRIG 1000ML POUR BTL (IV SOLUTION) ×4 IMPLANT
PACK SURGICAL SETUP 50X90 (CUSTOM PROCEDURE TRAY) ×4 IMPLANT
PAD ARMBOARD 7.5X6 YLW CONV (MISCELLANEOUS) ×4 IMPLANT
PENCIL BUTTON HOLSTER BLD 10FT (ELECTRODE) ×4 IMPLANT
SPECIMEN JAR SMALL (MISCELLANEOUS) ×4 IMPLANT
SPONGE LAP 18X18 X RAY DECT (DISPOSABLE) ×4 IMPLANT
STRIP CLOSURE SKIN 1/2X4 (GAUZE/BANDAGES/DRESSINGS) ×3 IMPLANT
SUT MON AB 4-0 PC3 18 (SUTURE) ×4 IMPLANT
SUT PROLENE 2 0 CT2 30 (SUTURE) ×8 IMPLANT
SUT SILK 2 0 SH (SUTURE) IMPLANT
SUT VIC AB 2-0 SH 18 (SUTURE) ×4 IMPLANT
SUT VIC AB 3-0 SH 27 (SUTURE) ×2
SUT VIC AB 3-0 SH 27XBRD (SUTURE) ×2 IMPLANT
SUT VICRYL AB 3 0 TIES (SUTURE) ×4 IMPLANT
SYR CONTROL 10ML LL (SYRINGE) ×4 IMPLANT
TOWEL OR 17X24 6PK STRL BLUE (TOWEL DISPOSABLE) ×4 IMPLANT
TOWEL OR 17X26 10 PK STRL BLUE (TOWEL DISPOSABLE) ×4 IMPLANT

## 2014-11-04 NOTE — Transfer of Care (Signed)
Immediate Anesthesia Transfer of Care Note  Patient: [name]  Procedure(s) Performed: Procedure(s): RIGHT INGUINAL HERNIA REPAIR WITH MESH (Right) REMOVAL OF RIGHT GROIN SOFT TISSUE MASS (N/A)  Patient Location: PACU  Anesthesia Type:GA combined with regional for post-op pain  Level of Consciousness: awake, alert , oriented and sedated  Airway & Oxygen Therapy: Patient Spontanous Breathing and Patient connected to nasal cannula oxygen  Post-op Assessment: Report given to RN, Post -op Vital signs reviewed and stable and Patient moving all extremities X 4  Post vital signs: Reviewed and stable  Last Vitals:  Filed Vitals:   11/04/14 1012  BP: 166/64  Pulse: 62  Temp:   Resp: 25    Complications: No apparent anesthesia complications

## 2014-11-04 NOTE — Progress Notes (Signed)
Pt ambulated with 1 assist to restroom/ voided.

## 2014-11-04 NOTE — Anesthesia Preprocedure Evaluation (Addendum)
Anesthesia Evaluation  Patient identified by MRN, date of birth, ID band Patient awake    Reviewed: Allergy & Precautions, NPO status , Patient's Chart, lab work & pertinent test results  History of Anesthesia Complications (+) PONV  Airway Mallampati: II  TM Distance: >3 FB Neck ROM: Full    Dental  (+) Edentulous Upper, Edentulous Lower   Pulmonary shortness of breath, Current Smoker,    Pulmonary exam normal       Cardiovascular hypertension,     Neuro/Psych  Headaches,    GI/Hepatic GERD-  ,(+) Hepatitis -, C  Endo/Other    Renal/GU      Musculoskeletal   Abdominal   Peds  Hematology   Anesthesia Other Findings   Reproductive/Obstetrics                           Anesthesia Physical Anesthesia Plan  ASA: III  Anesthesia Plan: General   Post-op Pain Management:    Induction: Intravenous  Airway Management Planned: LMA  Additional Equipment:   Intra-op Plan:   Post-operative Plan: Extubation in OR  Informed Consent: I have reviewed the patients History and Physical, chart, labs and discussed the procedure including the risks, benefits and alternatives for the proposed anesthesia with the patient or authorized representative who has indicated his/her understanding and acceptance.   Dental advisory given  Plan Discussed with: CRNA, Anesthesiologist and Surgeon  Anesthesia Plan Comments:        Anesthesia Quick Evaluation

## 2014-11-04 NOTE — Interval H&P Note (Signed)
History and Physical Interval Note:  11/04/2014 9:50 AM  [name]  has presented today for surgery, with the diagnosis of RIGHT INGUINAL HERNIA  The various methods of treatment have been discussed with the patient and family. After consideration of risks, benefits and other options for treatment, the patient has consented to  Procedure(s): RIGHT INGUINAL HERNIA REPAIR WITH MESH (Right) as a surgical intervention .  The patient's history has been reviewed, patient examined, no change in status, stable for surgery.  I have reviewed the patient's chart and labs.  Questions were answered to the patient's satisfaction.     Berneda Piccininni Lenna Sciara

## 2014-11-04 NOTE — Op Note (Signed)
Preoperative diagnosis:  Right inguinal hernia.  Postoperative diagnosis:  Same with 5.5 cm right groin soft tissue mass  Procedure:  Right inguinal hernia repair with mesh and removal of soft tissue mass  Surgeon:  Jackolyn Confer, M.D.  Anesthesia:  General/LMA  with local (Marcaine).  Indication:  This is a 64 year old male with a long-standing right inguinal hernia that is symptomatic. He now presents for repair.  Technique:  He was seen in the holding room and the right groin was marked with my initials. A TAP block was performed by the anesthesiologist. He was brought to the operating, placed supine on the operating table, and the anesthetic was administered by the anesthesiologist. The hair in the right groin area was clipped as was felt to be necessary. This area was then sterilely prepped and draped.  Local anesthetic was infiltrated in the superficial and deep tissues in the right groin.  An incision was made through the skin and subcutaneous tissue until the external oblique aponeurosis was identified.  Local anesthetic was infiltrated deep to the external oblique aponeurosis. The external oblique aponeurosis was divided through the external ring medially and back toward the anterior superior iliac spine laterally. Using blunt dissection, the shelving edge of the inguinal ligament was identified inferiorly and the internal oblique aponeurosis and muscle were identified superiorly. The ilioinguinal nerve was identified and preserved.  A soft tissue mass is noted at the lateral aspect of the incision. Using blunt dissection and electrocautery I removed the soft tissue mass as it was somewhat thinner plane of dissection. Grossly, it appeared to be consistent with lipoma. It measured 5.5 cm.  The spermatic cord was isolated and a posterior window was made around it. An indirect hernia sac was identified and separated from the spermatic cord using blunt dissection. The hernia sac and its  contents were reduced through the indirect hernia defect.   A piece of 3" x 6" polypropylene mesh was brought into the field and anchored 1-2 cm medial to the pubic tubercle with 2-0 Prolene suture. The inferior aspect of the mesh was anchored to the shelving edge of the inguinal ligament with running 2-0 Prolene suture to a level 1-2 cm lateral to the internal ring. A slit was cut in the mesh creating 2 tails. These were wrapped around the spermatic cord. The superior aspect of the mesh was anchored to the internal oblique aponeurosis and muscle with interrupted 2-0 Vicryl sutures. The 2 tails of the mesh were then crossed creating a new internal ring and were anchored to the shelving edge of the inguinal ligament with 2-0 Prolene suture. The tip of a hemostat could be placed through the new aperture. The lateral aspect of the mesh was then tucked deep to the external oblique aponeurosis.  The wound was inspected and hemostasis was adequate. The external oblique aponeurosis was then closed over the mesh and cord with running 3-0 Vicryl suture. The subcutaneous tissue was closed with running 3-0 Vicryl suture. The skin closed with a running 4-0 Monocryl subcuticular stitch.  Steri-Strips and a sterile dressing were applied.  The procedure was well-tolerated without any apparent complications and the he was taken to the recovery room in satisfactory condition.

## 2014-11-04 NOTE — Anesthesia Postprocedure Evaluation (Signed)
Anesthesia Post Note  Patient: [name]  Procedure(s) Performed: Procedure(s) (LRB): RIGHT INGUINAL HERNIA REPAIR WITH MESH (Right) REMOVAL OF RIGHT GROIN SOFT TISSUE MASS (N/A)  Anesthesia type: general  Patient location: PACU  Post pain: Pain level controlled  Post assessment: Patient's Cardiovascular Status Stable  Last Vitals:  Filed Vitals:   11/04/14 1245  BP:   Pulse: 59  Temp:   Resp: 10    Post vital signs: Reviewed and stable  Level of consciousness: sedated  Complications: No apparent anesthesia complications

## 2014-11-04 NOTE — H&P (View-Only) (Signed)
Patient ID: [name], male   DOB: August 10, 1950, 64 y.o.   MRN: [id number] Ruger Saxer. Masin 10/03/2014 1:42 PM Location: [facility] Patient #: [id number] DOB: 1951/04/19 Separated / Language: [name] / Race: Black or African American Male  History of Present Illness [name] MD; 10/03/2014 3:20 PM) Patient words: RIH.  The patient is a 64 year old male   Note:He is here to schedule his right inguinal hernia repair. He had his stress test done 8 days ago and it was a low risk type stress test. The hernia is getting larger and more painful and now he wants to schedule repair. He is voiding okay for the most part. He has been seen by a urologist at the [facility] clinic and needs to have a follow-up appointment with him. He states they found a small area on his prostate gland.   Allergies (Sonya Bynum, CMA; 10/03/2014 1:43 PM) Penicillamine *ASSORTED CLASSES*  Medication History (Sonya Bynum, CMA; 10/03/2014 1:43 PM) Norvasc (10MG  Tablet, Oral daily) Active. Vitamin D (400UNIT Capsule, Oral four times daily, as needed) Active. Ferrous Sulfate (daily) Active. Gabapentin Active. Gabapentin (100MG  Capsule, Oral daily) Active. Hydrochlorothiazide (12.5MG  Tablet, Oral daily) Active. Lisinopril-Hydrochlorothiazide (20-12.5MG  Tablet, Oral daily) Active. Metoprolol Succinate ER (25MG  Tablet ER 24HR, Oral daily) Active. Medications Reconciled  Vitals (Sonya Bynum CMA; 10/03/2014 1:43 PM) 10/03/2014 1:43 PM Weight: 216 lb Height: 69in Body Surface Area: 2.18 m Body Mass Index: 31.9 kg/m Temp.: 97.63F(Temporal)  Pulse: 86 (Regular)  BP: 138/92 (Sitting, Left Arm, Standard)    Physical Exam [name] MD; 10/03/2014 3:21 PM) The physical exam findings are as follows: Note:General: WDWN in NAD. Pleasant and cooperative.  CV: RRR, no murmur, no JVD.  CHEST: Breath sounds equal and clear. Respirations  nonlabored.  ABDOMEN: Soft, there is a wide midline abdominal scar present.  GU: There is a moderate to large sized right inguinal bulge that is reducible. Left inguinal floor is solid.  NEUROLOGIC: Alert and oriented, answers questions appropriately, normal gait and station.  PSYCHIATRIC: Normal mood, affect , and behavior.    Assessment & Plan [name] MD; 10/03/2014 3:22 PM) HERNIA, INGUINAL, RIGHT (550.90  K40.90) Impression: Stress test is low risk. He is ready to schedule repair.  Plan: Open right inguinal hernia repair mesh. Procedure, risks, and after care had been discussed with him previously.  Jackolyn Confer, MD

## 2014-11-04 NOTE — Anesthesia Procedure Notes (Addendum)
Anesthesia Regional Block:  TAP block  Pre-Anesthetic Checklist: ,, timeout performed, Correct Patient, Correct Site, Correct Laterality, Correct Procedure, Correct Position, site marked, Risks and benefits discussed,  Surgical consent,  Pre-op evaluation,  At surgeon's request and post-op pain management  Laterality: Right  Prep: chloraprep       Needles:  Injection technique: Single-shot  Needle Type: Echogenic Stimulator Needle     Needle Length: 10cm 10 cm Needle Gauge: 21 and 21 G    Additional Needles:  Procedures: ultrasound guided (picture in chart) TAP block Narrative:  Start time: 11/04/2014 9:49 AM End time: 11/04/2014 9:59 AM Injection made incrementally with aspirations every 5 mL.  Performed by: Personally    Procedure Name: LMA Insertion Date/Time: 11/04/2014 10:37 AM Performed by: Gaylene Brooks Pre-anesthesia Checklist: Patient identified, Timeout performed, Emergency Drugs available, Suction available and Patient being monitored Patient Re-evaluated:Patient Re-evaluated prior to inductionOxygen Delivery Method: Circle system utilized Preoxygenation: Pre-oxygenation with 100% oxygen Intubation Type: IV induction Ventilation: Mask ventilation without difficulty LMA Size: 5.0 Number of attempts: 1 Tube secured with: Tape Dental Injury: Teeth and Oropharynx as per pre-operative assessment

## 2014-11-04 NOTE — Discharge Instructions (Signed)
[facility] _______[facility]  INGUINAL HERNIA REPAIR: POST OP INSTRUCTIONS  Always review your discharge instruction sheet given to you by the facility where your surgery was performed. IF YOU HAVE DISABILITY OR FAMILY LEAVE FORMS, YOU MUST BRING THEM TO THE OFFICE FOR PROCESSING.   DO NOT GIVE THEM TO YOUR DOCTOR.  1. A  prescription for pain medication may be given to you upon discharge.  Take your pain medication as prescribed, if needed.  If narcotic pain medicine is not needed, then you may take acetaminophen (Tylenol) or ibuprofen (Advil) as needed. 2. Take your usually prescribed medications unless otherwise directed. 3. If you need a refill on your pain medication, please contact your pharmacy.  They will contact our office to request authorization. Prescriptions will not be filled after 5 pm or on week-ends. 4. You should follow a light diet the first 24 hours after arrival home, such as soup and crackers, etc.  Be sure to include lots of fluids daily.  Resume your normal diet the day after surgery. 5. Most patients will experience some swelling and bruising around the umbilicus or in the groin and scrotum.  Ice packs and reclining will help.  Swelling and bruising can take several days to resolve.  6. It is common to experience some constipation if taking pain medication after surgery.  Increasing fluid intake and taking a stool softener (such as Colace) will usually help or prevent this problem from occurring.  A mild laxative (Milk of Magnesia or Miralax) should be taken according to package directions if there are no bowel movements after 48 hours. 7. Unless discharge instructions indicate otherwise, you may remove your bandages 72 hours after surgery, and you may shower the day after surgery.  You may have steri-strips (small skin tapes) in place directly over the incision.  These strips should be left on the skin.  If your surgeon used skin glue on the incision, you may shower  in 24 hours.  The glue will flake off over the next 2-3 weeks.  Any sutures or staples will be removed at the office during your follow-up visit. 8. ACTIVITIES:  You may resume regular (light) daily activities beginning the next day--such as daily self-care, walking, climbing stairs--gradually increasing activities as tolerated.  You may have sexual intercourse when it is comfortable.  Refrain from any heavy lifting or straining-nothing over 10 pounds for 6 weeks.  a. You may drive when you are no longer taking prescription pain medication, you can comfortably wear a seatbelt, and you can safely maneuver your car and apply brakes. b. RETURN TO WORK:  __________________________________________________________ 9. You should see your doctor in the office for a follow-up appointment approximately 2-3 weeks after your surgery.  Make sure that you call for this appointment within a day or two after you arrive home to insure a convenient appointment time. 10. OTHER INSTRUCTIONS:  __________________________________________________________________________________________________________________________________________________________________________________________  WHEN TO CALL YOUR DOCTOR: 1. Fever over 101.0 2. Inability to urinate 3. Nausea and/or vomiting 4. Extreme swelling or bruising 5. Continued bleeding from incision. 6. Increased pain, redness, or drainage from the incision  The clinic staff is available to answer your questions during regular business hours.  Please dont hesitate to call and ask to speak to one of the nurses for clinical concerns.  If you have a medical emergency, go to the nearest emergency room or call 911.  A surgeon from [facility] is always on call at the hospital   [address], [address], [place], [place]  [address] ? ° [address] [address], [place], [place]   [address] °[phone] ? [phone] ? FAX [phone] °Web site:  [email or web address] ° °

## 2014-11-05 NOTE — OR Nursing (Signed)
Late entry for delay code documentation. 

## 2014-11-06 ENCOUNTER — Encounter (HOSPITAL_COMMUNITY): Payer: Self-pay | Admitting: General Surgery

## 2014-11-09 ENCOUNTER — Telehealth: Payer: Self-pay | Admitting: General Surgery

## 2014-11-09 NOTE — Telephone Encounter (Signed)
Returned PT's phone call. States he has run out of pain medicine from recent [facility] repair by Dr Zella Richer. Motrin/tylenol not relieving pain. Asked for addl pain med to be called in. No fever, chills, n/v. +BM. +urination. But states having hard time standing up due to pain and area is very hard. Advised him we can't call in narcotics due to federal law. If he has maximized tylenol and motrin and pain still uncontrolled only option is to go to ED to get pain Rx. Pt stated he was going to call ambulance.

## 2014-11-24 ENCOUNTER — Ambulatory Visit (HOSPITAL_BASED_OUTPATIENT_CLINIC_OR_DEPARTMENT_OTHER): Payer: Medicare Other | Attending: Cardiology

## 2015-05-30 ENCOUNTER — Emergency Department (HOSPITAL_COMMUNITY): Payer: Medicare Other

## 2015-05-30 ENCOUNTER — Inpatient Hospital Stay (HOSPITAL_COMMUNITY)
Admission: EM | Admit: 2015-05-30 | Discharge: 2015-06-04 | DRG: 065 | Disposition: A | Discharge status: Home Health | Payer: Medicare Other | Attending: Internal Medicine | Admitting: Internal Medicine

## 2015-05-30 ENCOUNTER — Encounter (HOSPITAL_COMMUNITY): Payer: Self-pay

## 2015-05-30 DIAGNOSIS — E785 Hyperlipidemia, unspecified: Secondary | ICD-10-CM | POA: Diagnosis present

## 2015-05-30 DIAGNOSIS — G8194 Hemiplegia, unspecified affecting left nondominant side: Secondary | ICD-10-CM | POA: Diagnosis present

## 2015-05-30 DIAGNOSIS — R471 Dysarthria and anarthria: Secondary | ICD-10-CM | POA: Diagnosis present

## 2015-05-30 DIAGNOSIS — R4781 Slurred speech: Secondary | ICD-10-CM | POA: Diagnosis present

## 2015-05-30 DIAGNOSIS — I10 Essential (primary) hypertension: Secondary | ICD-10-CM | POA: Diagnosis present

## 2015-05-30 DIAGNOSIS — Z79899 Other long term (current) drug therapy: Secondary | ICD-10-CM

## 2015-05-30 DIAGNOSIS — I639 Cerebral infarction, unspecified: Principal | ICD-10-CM | POA: Diagnosis present

## 2015-05-30 DIAGNOSIS — B1920 Unspecified viral hepatitis C without hepatic coma: Secondary | ICD-10-CM | POA: Diagnosis present

## 2015-05-30 DIAGNOSIS — F411 Generalized anxiety disorder: Secondary | ICD-10-CM | POA: Diagnosis present

## 2015-05-30 DIAGNOSIS — F17210 Nicotine dependence, cigarettes, uncomplicated: Secondary | ICD-10-CM | POA: Diagnosis present

## 2015-05-30 DIAGNOSIS — R29810 Facial weakness: Secondary | ICD-10-CM | POA: Diagnosis present

## 2015-05-30 DIAGNOSIS — Z720 Tobacco use: Secondary | ICD-10-CM | POA: Insufficient documentation

## 2015-05-30 DIAGNOSIS — G4700 Insomnia, unspecified: Secondary | ICD-10-CM | POA: Diagnosis present

## 2015-05-30 DIAGNOSIS — Z8546 Personal history of malignant neoplasm of prostate: Secondary | ICD-10-CM

## 2015-05-30 DIAGNOSIS — Z9119 Patient's noncompliance with other medical treatment and regimen: Secondary | ICD-10-CM

## 2015-05-30 DIAGNOSIS — F1010 Alcohol abuse, uncomplicated: Secondary | ICD-10-CM | POA: Diagnosis present

## 2015-05-30 HISTORY — DX: Gastrointestinal hemorrhage, unspecified: K92.2

## 2015-05-30 HISTORY — DX: Vascular disorder of intestine, unspecified: K55.9

## 2015-05-30 LAB — COMPREHENSIVE METABOLIC PANEL
ALT: 17 U/L (ref 17–63)
AST: 24 U/L (ref 15–41)
Albumin: 4.1 g/dL (ref 3.5–5.0)
Alkaline Phosphatase: 126 U/L (ref 38–126)
Anion gap: 7 (ref 5–15)
BUN: 13 mg/dL (ref 6–20)
CO2: 25 mmol/L (ref 22–32)
Calcium: 9.1 mg/dL (ref 8.9–10.3)
Chloride: 107 mmol/L (ref 101–111)
Creatinine, Ser: 0.88 mg/dL (ref 0.61–1.24)
GFR calc Af Amer: >60 mL/min (ref >60)
GFR calc non Af Amer: >60 mL/min (ref >60)
Glucose, Bld: 82 mg/dL (ref 65–99)
Potassium: 3.6 mmol/L (ref 3.5–5.1)
Sodium: 139 mmol/L (ref 135–145)
Total Bilirubin: 0.8 mg/dL (ref 0.3–1.2)
Total Protein: 7.5 g/dL (ref 6.5–8.1)

## 2015-05-30 LAB — DIFFERENTIAL
Basophils Absolute: 0 10*3/uL (ref 0.0–0.1)
Basophils Relative: 0 %
Eosinophils Absolute: 0.1 10*3/uL (ref 0.0–0.7)
Eosinophils Relative: 1 %
Lymphocytes Relative: 37 %
Lymphs Abs: 1.9 10*3/uL (ref 0.7–4.0)
Monocytes Absolute: 0.4 10*3/uL (ref 0.1–1.0)
Monocytes Relative: 8 %
Neutro Abs: 2.7 10*3/uL (ref 1.7–7.7)
Neutrophils Relative %: 54 %

## 2015-05-30 LAB — CBC
HCT: 40.4 % (ref 39.0–52.0)
Hemoglobin: 12.5 g/dL — ABNORMAL LOW (ref 13.0–17.0)
MCH: 25.5 pg — ABNORMAL LOW (ref 26.0–34.0)
MCHC: 30.9 g/dL (ref 30.0–36.0)
MCV: 82.3 fL (ref 78.0–100.0)
Platelets: 279 10*3/uL (ref 150–400)
RBC: 4.91 MIL/uL (ref 4.22–5.81)
RDW: 16 % — ABNORMAL HIGH (ref 11.5–15.5)
WBC: 5.1 10*3/uL (ref 4.0–10.5)

## 2015-05-30 LAB — I-STAT TROPONIN, ED: Troponin i, poc: 0 ng/mL (ref 0.00–0.08)

## 2015-05-30 LAB — CBG MONITORING, ED: Glucose-Capillary: 90 mg/dL (ref 65–99)

## 2015-05-30 LAB — APTT: aPTT: 40 seconds — ABNORMAL HIGH (ref 24–37)

## 2015-05-30 LAB — PROTIME-INR
INR: 1.34 (ref 0.00–1.49)
Prothrombin Time: 16.7 seconds — ABNORMAL HIGH (ref 11.6–15.2)

## 2015-05-30 MED ORDER — IOHEXOL 300 MG/ML  SOLN: 25.0000 mL | Freq: Once | INTRAMUSCULAR | Status: DC | PRN

## 2015-05-30 MED ORDER — IOHEXOL 300 MG/ML  SOLN
100.0000 mL | Freq: Once | INTRAMUSCULAR | Status: AC | PRN
  Administered 2015-05-30: 100 mL via INTRAVENOUS

## 2015-05-30 MED ORDER — HYDROMORPHONE HCL 1 MG/ML IJ SOLN
1.0000 mg | Freq: Once | INTRAMUSCULAR | Status: AC
  Administered 2015-05-30: 1 mg via INTRAVENOUS
  Filled 2015-05-30: qty 1

## 2015-05-30 MED ORDER — HYDRALAZINE HCL 20 MG/ML IJ SOLN
INTRAMUSCULAR | Status: AC
  Filled 2015-05-30: qty 1

## 2015-05-30 MED ORDER — HYDRALAZINE HCL 20 MG/ML IJ SOLN
10.0000 mg | Freq: Once | INTRAMUSCULAR | Status: AC
  Administered 2015-05-30: 10 mg via INTRAVENOUS
  Filled 2015-05-30: qty 1

## 2015-05-30 MED ORDER — HYDRALAZINE HCL 20 MG/ML IJ SOLN
10.0000 mg | Freq: Once | INTRAMUSCULAR | Status: AC
  Administered 2015-05-30: 10 mg via INTRAVENOUS

## 2015-05-30 MED ORDER — SODIUM CHLORIDE 0.9 % IV BOLUS (SEPSIS)
1000.0000 mL | Freq: Once | INTRAVENOUS | Status: AC
  Administered 2015-05-30: 1000 mL via INTRAVENOUS

## 2015-05-30 MED ORDER — LORAZEPAM 2 MG/ML IJ SOLN
1.0000 mg | Freq: Once | INTRAMUSCULAR | Status: AC
  Administered 2015-05-30: 1 mg via INTRAVENOUS
  Filled 2015-05-30: qty 1

## 2015-05-30 NOTE — ED Notes (Signed)
Pt states that he is still having 7/10 pain radiating from his belly down his legs. He wants a doctor to look at his left knee.

## 2015-05-30 NOTE — ED Notes (Addendum)
BP 219/76; Dr Kathrynn Humble notified and orders received.

## 2015-05-30 NOTE — ED Notes (Signed)
Pt able to read 6 word test for speech clarity and comprehension

## 2015-05-30 NOTE — ED Notes (Signed)
Bed: [id number] Expected date:  Expected time:  Means of arrival:  Comments: Triage 9

## 2015-05-30 NOTE — ED Notes (Signed)
Provider in room  

## 2015-05-30 NOTE — ED Notes (Signed)
Pt gone to CT 

## 2015-05-30 NOTE — ED Notes (Signed)
Pt c/o L arm and L leg weakness x 1 days and abdominal pain x 3 days.  Pain score 9/10.  Last seen normal 2100 on 11/9.  Pt reports that his L leg "just gives out."  L arm drift noted.  Facial symmetry noted.  Hx of HTN.

## 2015-05-30 NOTE — ED Provider Notes (Signed)
CSN: [id number]     Arrival date & time 05/30/15  1453 History   First MD Initiated Contact with Patient 05/30/15 1521     Chief Complaint  Patient presents with  . Extremity Weakness     (Consider location/radiation/quality/duration/timing/severity/associated sxs/prior Treatment) HPI Comments: Pt comes in with cc of L sided weakness. Pt has hx of HTN, HL, heavy smoking. His fiance reports that Wednesday pt woke up and he had slurred speech and his face looked assymetric. Pt reports that he has been walking while dragging his feet since then and has had balance issues. The speech has improved. There is no hx of strokes. Pt denies nausea, emesis, fevers, chills, chest pains, shortness of breath, headaches, abdominal pain, uti like symptoms.  Pt also reports abd pain. Hx of hernia. Reports lower quadrant abd pain that has been troubling him the last few days.  ROS 10 Systems reviewed and are negative for acute change except as noted in the HPI.     Patient is a 64 y.o. male presenting with extremity weakness. The history is provided by the patient and a friend.  Extremity Weakness    Past Medical History  Diagnosis Date  . Hyperlipidemia     not on any meds  . Hypertension     takes Amlodipine and Lisinopril daily  . Insomnia     takes Ambien nightly  . History of blood transfusion     no abnormal reaction noted  . PONV (postoperative nausea and vomiting)   . Shortness of breath dyspnea     rarely but when notices he can be lying/sitting/exertion.Dr.Hochrein is aware per pt  . Headache     occasionally  . Numbness     both legs occasionally  . Arthritis   . Joint pain   . Joint swelling   . Chronic back pain   . History of gout     doesn't take any meds  . GERD (gastroesophageal reflux disease)     uses Baking Soda  . Hepatitis C   . Urinary frequency   . Urinary urgency   . Nocturia   . Glaucoma     right eye  . Cancer [facility])     Prostate CA   Past Surgical  History  Procedure Laterality Date  . Appendectomy    . Small intestine surgery    . Multiple abdominal surgeries      total of 13  . Prostatectomy    . Colonoscopy    . Esophagogastroduodenoscopy    . Inguinal hernia repair Right 11/04/2014    Procedure: RIGHT INGUINAL HERNIA REPAIR WITH MESH;  Surgeon: Jackolyn Confer, MD;  Location: [place];  Service: General;  Laterality: Right;  . Mass excision N/A 11/04/2014    Procedure: REMOVAL OF RIGHT GROIN SOFT TISSUE MASS;  Surgeon: Jackolyn Confer, MD;  Location: [facility] OR;  Service: General;  Laterality: N/A;   Family History  Problem Relation Age of Onset  . Alzheimer's disease Father   . Cancer Mother     Lymph node  . Heart failure Brother 45    Transplant 13 years ago  . CAD Brother   . Heart disease Sister 60    MI   Social History  Substance Use Topics  . Smoking status: Current Every Day Smoker -- 0.50 packs/day for 50 years    Types: Cigarettes  . Smokeless tobacco: Never Used  . Alcohol Use: 0.0 oz/week     Comment: occasionally    Review  of Systems  Musculoskeletal: Positive for extremity weakness.  All other systems reviewed and are negative.     Allergies  Penicillins and Sudafed  Home Medications   Prior to Admission medications   Medication Sig Start Date End Date Taking? Authorizing Provider  Doxylamine Succinate, Sleep, (SLEEP AID PO) Take 2 capsules by mouth at bedtime as needed (sleep).   Yes Historical Provider, MD  lisinopril (PRINIVIL,ZESTRIL) 20 MG tablet Take 20 mg by mouth daily.  09/11/14 09/11/15 Yes Historical Provider, MD  amLODipine (NORVASC) 5 MG tablet Take 1 tablet (5 mg total) by mouth daily. Patient not taking: Reported on 05/30/2015 09/18/14   Minus Breeding, MD  ondansetron (ZOFRAN ODT) 8 MG disintegrating tablet Take 1 tablet (8 mg total) by mouth every 8 (eight) hours as needed for nausea or vomiting. Patient not taking: Reported on 05/30/2015 09/06/14   Jola Schmidt, MD  oxyCODONE (OXY  IR/ROXICODONE) 5 MG immediate release tablet Take 1-2 tablets (5-10 mg total) by mouth every 4 (four) hours as needed for moderate pain, severe pain or breakthrough pain. Patient not taking: Reported on 05/30/2015 11/04/14   Jackolyn Confer, MD   BP 167/55 mmHg  Pulse 66  Temp(Src) 98.5 F (36.9 C) (Oral)  Resp 19  Ht 5' 10.5" (1.791 m)  Wt 205 lb (92.987 kg)  BMI 28.99 kg/m2  SpO2 98% Physical Exam  Constitutional: He is oriented to person, place, and time. He appears well-developed and well-nourished.  HENT:  Head: Normocephalic and atraumatic.  Eyes: EOM are normal. Pupils are equal, round, and reactive to light.  Neck: Normal range of motion. Neck supple. No JVD present.  Cardiovascular: Normal rate and regular rhythm.   Pulmonary/Chest: Effort normal and breath sounds normal. No respiratory distress. He has no wheezes.  Abdominal: Soft. Bowel sounds are normal. He exhibits no distension. There is tenderness. There is guarding. There is no rebound.  lower quadrant tenderness, no hernia.  Neurological: He is alert and oriented to person, place, and time. Coordination normal.  NIHSS - 4 1: facial droop 1: slurred speech 1 LUE drift 1: LLE drift   Skin: Skin is warm and dry.  Nursing note reviewed.   ED Course  Procedures (including critical care time) Labs Review Labs Reviewed  PROTIME-INR - Abnormal; Notable for the following:    Prothrombin Time 16.7 (*)    All other components within normal limits  APTT - Abnormal; Notable for the following:    aPTT 40 (*)    All other components within normal limits  CBC - Abnormal; Notable for the following:    Hemoglobin 12.5 (*)    MCH 25.5 (*)    RDW 16.0 (*)    All other components within normal limits  DIFFERENTIAL  COMPREHENSIVE METABOLIC PANEL  I-STAT TROPOININ, ED  CBG MONITORING, ED    Imaging Review Ct Head Wo Contrast  05/30/2015  CLINICAL DATA:  Left arm and leg weakness for 1 day. EXAM: CT HEAD WITHOUT  CONTRAST TECHNIQUE: Contiguous axial images were obtained from the base of the skull through the vertex without intravenous contrast. COMPARISON:  04/03/2014 FINDINGS: No acute intracranial abnormality. Specifically, no hemorrhage, hydrocephalus, mass lesion, acute infarction, or significant intracranial injury. No acute calvarial abnormality. Visualized paranasal sinuses and mastoids clear. Orbital soft tissues unremarkable. IMPRESSION: No acute intracranial abnormality. Electronically Signed   By: Rolm Baptise M.D.   On: 05/30/2015 16:09   Mr [name] Contrast  05/30/2015  CLINICAL DATA:  Initial evaluation for acute left  arm and leg weakness for 1 day. EXAM: MRI HEAD WITHOUT CONTRAST TECHNIQUE: Multiplanar, multiecho pulse sequences of the brain and surrounding structures were obtained without intravenous contrast. COMPARISON:  Prior CT from earlier the same day. FINDINGS: Study is moderately degraded by motion artifact. There is an 8 mm focus of high signal intensity on DWI sequence and the right paramedian pons, consistent with acute ischemic infarct. There is subtle associated signal loss on ADC map. This is located immediately adjacent to several other remote lacunar infarcts within the pons. No other acute infarct identified. Normal intravascular flow voids are maintained. No acute or chronic intracranial hemorrhage. Age-related cerebral atrophy present. Patchy T2/FLAIR hyperintensity within the periventricular white matter most consistent with chronic small vessel ischemic disease, mild to moderate in nature. Additional remote lacunar infarcts within the left thalamus, left basal ganglia, and right corona radiata. No mass lesion, midline shift, or mass effect. No hydrocephalus. No extra-axial fluid collection. Craniocervical junction within normal limits. Pituitary gland normal. No acute abnormality about the partially visualized orbits and globes. Scattered mucosal thickening within the ethmoidal air  cells. No mastoid effusion. Inner ear structures grossly normal. Bone marrow signal intensity within normal limits. Scalp soft tissues demonstrate no acute abnormality. IMPRESSION: 1. 8 mm acute ischemic lacunar type infarct within the the right paramedian pons. No associated hemorrhage or mass effect. This infarct is located immediately adjacent to several remote chronic lacunar infarcts within the pons. 2. Generalized cerebral atrophy with moderate chronic small vessel ischemic disease and remote lacunar infarcts as above. Electronically Signed   By: Jeannine Boga M.D.   On: 05/30/2015 22:55   Ct Abdomen Pelvis W Contrast  05/30/2015  CLINICAL DATA:  Left arm and left leg weakness of 1 day duration. Abdominal pain of 3 days duration. EXAM: CT ABDOMEN AND PELVIS WITH CONTRAST TECHNIQUE: Multidetector CT imaging of the abdomen and pelvis was performed using the standard protocol following bolus administration of intravenous contrast. CONTRAST:  134mL OMNIPAQUE IOHEXOL 300 MG/ML  SOLN COMPARISON:  09/06/2014 FINDINGS: Lung bases are clear.  There is a moderate size hiatal hernia. The liver has a normal appearance without focal lesions or biliary ductal dilatation. No calcified gallstones. The spleen is normal. The pancreas is normal. The adrenal glands are normal. The right kidney is normal. The left kidney is normal except for an exophytic entity measuring 1 cm in size which has not changed since 2013 and has only enlarged minimally since 2007. This likely represents a cyst. Few other tiny cysts in the left kidney. There is atherosclerosis of the aorta but no aneurysm. There is extensive atherosclerosis of the iliac vessels with ectasia. The IVC is normal. No retroperitoneal mass or adenopathy. No free intraperitoneal fluid or air. Previously seen inguinal hernia on the right has apparently been repaired. No complications seen relative to that. There is no evidence of bowel obstruction or other acute  bowel pathology. There is chronic scarring of the anterior abdominal wall related to previous surgery. IMPRESSION: No acute finding to explain pain. Moderate size hiatal hernia. Atherosclerosis of the aorta and its branch vessels. Previous repair of right inguinal hernia without visible complication. Electronically Signed   By: Nelson Chimes M.D.   On: 05/30/2015 19:38   I have personally reviewed and evaluated these images and lab results as part of my medical decision-making.   EKG Interpretation   Date/Time:  Friday May 30 2015 15:10:31 EST Ventricular Rate:  74 PR Interval:  194 QRS Duration: 93 QT Interval:  422 QTC Calculation: 468 R Axis:   -36 Text Interpretation:  Sinus rhythm Probable left atrial enlargement LVH  with secondary repolarization abnormality Anterior Q waves, possibly due  to LVH Nonspecific T wave abnormality New since previous tracing Confirmed  by Winfred Leeds  MD, SAM [id number]) on 05/30/2015 3:36:19 PM      @12 :00: Ct abd is neg. PO challenge passed. MRI is + for R sided lacunar stroke. Dr. Nicole Kindred, Neuro would appreciate transfer to [facility]. Pt  Made aware of the dx and the plan.  MDM   Final diagnoses:  Acute ischemic stroke ([place])    Pt comes in with cc of stroke like symptoms.  DDx includes:  Stroke - ischemic vs. hemorrhagic TIA Neuropathy Myelitis Electrolyte abnormality Neuropathy Muscular disease  Ct head done - neg. MRI ordered.  Pt has lower quadrant abd pain. Heavy smoker. CT abd ordered given the guarding.   Smoking cessation instruction/counseling given:  counseled patient on the dangers of tobacco use, advised patient to stop smoking, and reviewed strategies to maximize success. Discussion 3-5 min.   Varney Biles, MD 05/31/15 [phone]

## 2015-05-30 NOTE — ED Notes (Signed)
Assisted with NIH stroke scale [phone]

## 2015-05-31 ENCOUNTER — Inpatient Hospital Stay (HOSPITAL_COMMUNITY): Payer: Medicare Other

## 2015-05-31 DIAGNOSIS — F411 Generalized anxiety disorder: Secondary | ICD-10-CM | POA: Diagnosis present

## 2015-05-31 DIAGNOSIS — R29810 Facial weakness: Secondary | ICD-10-CM | POA: Diagnosis present

## 2015-05-31 DIAGNOSIS — Z8546 Personal history of malignant neoplasm of prostate: Secondary | ICD-10-CM | POA: Diagnosis not present

## 2015-05-31 DIAGNOSIS — Z79899 Other long term (current) drug therapy: Secondary | ICD-10-CM | POA: Diagnosis not present

## 2015-05-31 DIAGNOSIS — R4781 Slurred speech: Secondary | ICD-10-CM | POA: Diagnosis present

## 2015-05-31 DIAGNOSIS — I200 Unstable angina: Secondary | ICD-10-CM | POA: Diagnosis not present

## 2015-05-31 DIAGNOSIS — B1920 Unspecified viral hepatitis C without hepatic coma: Secondary | ICD-10-CM | POA: Diagnosis present

## 2015-05-31 DIAGNOSIS — I6789 Other cerebrovascular disease: Secondary | ICD-10-CM | POA: Diagnosis not present

## 2015-05-31 DIAGNOSIS — G8194 Hemiplegia, unspecified affecting left nondominant side: Secondary | ICD-10-CM | POA: Diagnosis present

## 2015-05-31 DIAGNOSIS — I10 Essential (primary) hypertension: Secondary | ICD-10-CM | POA: Diagnosis present

## 2015-05-31 DIAGNOSIS — R7303 Prediabetes: Secondary | ICD-10-CM | POA: Diagnosis not present

## 2015-05-31 DIAGNOSIS — G8190 Hemiplegia, unspecified affecting unspecified side: Secondary | ICD-10-CM | POA: Diagnosis not present

## 2015-05-31 DIAGNOSIS — F17210 Nicotine dependence, cigarettes, uncomplicated: Secondary | ICD-10-CM | POA: Diagnosis present

## 2015-05-31 DIAGNOSIS — I639 Cerebral infarction, unspecified: Secondary | ICD-10-CM

## 2015-05-31 DIAGNOSIS — F1010 Alcohol abuse, uncomplicated: Secondary | ICD-10-CM | POA: Diagnosis present

## 2015-05-31 DIAGNOSIS — R471 Dysarthria and anarthria: Secondary | ICD-10-CM | POA: Diagnosis present

## 2015-05-31 DIAGNOSIS — Z9119 Patient's noncompliance with other medical treatment and regimen: Secondary | ICD-10-CM | POA: Diagnosis not present

## 2015-05-31 DIAGNOSIS — I6350 Cerebral infarction due to unspecified occlusion or stenosis of unspecified cerebral artery: Secondary | ICD-10-CM | POA: Diagnosis not present

## 2015-05-31 DIAGNOSIS — I69392 Facial weakness following cerebral infarction: Secondary | ICD-10-CM | POA: Diagnosis not present

## 2015-05-31 DIAGNOSIS — E785 Hyperlipidemia, unspecified: Secondary | ICD-10-CM | POA: Diagnosis present

## 2015-05-31 DIAGNOSIS — Z720 Tobacco use: Secondary | ICD-10-CM | POA: Diagnosis not present

## 2015-05-31 DIAGNOSIS — G4700 Insomnia, unspecified: Secondary | ICD-10-CM | POA: Diagnosis present

## 2015-05-31 LAB — LIPID PANEL
Cholesterol: 92 mg/dL (ref 0–200)
HDL: 35 mg/dL — ABNORMAL LOW (ref >40)
LDL Cholesterol: 40 mg/dL (ref 0–99)
Total CHOL/HDL Ratio: 2.6 RATIO
Triglycerides: 87 mg/dL (ref <150)
VLDL: 17 mg/dL (ref 0–40)

## 2015-05-31 MED ORDER — ACETAMINOPHEN 650 MG RE SUPP: 650.0000 mg | RECTAL | Status: DC | PRN

## 2015-05-31 MED ORDER — HYDROCODONE-ACETAMINOPHEN 7.5-325 MG PO TABS
1.0000 | ORAL_TABLET | Freq: Four times a day (QID) | ORAL | Status: DC | PRN
  Administered 2015-05-31 – 2015-06-02 (×6): 1 via ORAL
  Filled 2015-05-31 (×6): qty 1

## 2015-05-31 MED ORDER — ACETAMINOPHEN 325 MG PO TABS
650.0000 mg | ORAL_TABLET | ORAL | Status: DC | PRN
  Administered 2015-05-31: 650 mg via ORAL
  Filled 2015-05-31 (×2): qty 2

## 2015-05-31 MED ORDER — LISINOPRIL 20 MG PO TABS
20.0000 mg | ORAL_TABLET | Freq: Every day | ORAL | Status: DC
  Administered 2015-05-31 – 2015-06-04 (×5): 20 mg via ORAL
  Filled 2015-05-31 (×5): qty 1

## 2015-05-31 MED ORDER — HYDRALAZINE HCL 20 MG/ML IJ SOLN
10.0000 mg | Freq: Once | INTRAMUSCULAR | Status: AC
  Administered 2015-05-31: 10 mg via INTRAVENOUS
  Filled 2015-05-31: qty 1

## 2015-05-31 MED ORDER — STROKE: EARLY STAGES OF RECOVERY BOOK
Freq: Once | Status: AC
  Administered 2015-05-31: 04:00:00

## 2015-05-31 MED ORDER — HYDRALAZINE HCL 20 MG/ML IJ SOLN
20.0000 mg | INTRAMUSCULAR | Status: DC | PRN
  Administered 2015-05-31 – 2015-06-03 (×4): 20 mg via INTRAVENOUS
  Filled 2015-05-31 (×4): qty 1

## 2015-05-31 MED ORDER — SENNOSIDES-DOCUSATE SODIUM 8.6-50 MG PO TABS
1.0000 | ORAL_TABLET | Freq: Every evening | ORAL | Status: DC | PRN
  Administered 2015-06-02: 1 via ORAL
  Filled 2015-05-31: qty 1

## 2015-05-31 MED ORDER — LORAZEPAM 2 MG/ML IJ SOLN
1.0000 mg | INTRAMUSCULAR | Status: DC | PRN
  Administered 2015-05-31 – 2015-06-02 (×5): 1 mg via INTRAVENOUS
  Filled 2015-05-31 (×5): qty 1

## 2015-05-31 MED ORDER — HYDROCHLOROTHIAZIDE 25 MG PO TABS
25.0000 mg | ORAL_TABLET | Freq: Every day | ORAL | Status: DC
  Administered 2015-05-31 – 2015-06-04 (×6): 25 mg via ORAL
  Filled 2015-05-31 (×6): qty 1

## 2015-05-31 MED ORDER — ASPIRIN 300 MG RE SUPP: 300.0000 mg | Freq: Every day | RECTAL | Status: DC

## 2015-05-31 MED ORDER — ASPIRIN 325 MG PO TABS
325.0000 mg | ORAL_TABLET | Freq: Every day | ORAL | Status: DC
  Administered 2015-05-31 – 2015-06-04 (×5): 325 mg via ORAL
  Filled 2015-05-31 (×5): qty 1

## 2015-05-31 NOTE — ED Notes (Signed)
Notified MD of pt BP 237/93; orders received.

## 2015-05-31 NOTE — Progress Notes (Signed)
   05/31/15 1000  Clinical Encounter Type  Visited With Patient;Family;Patient and family together  Visit Type Initial;Psychological support;Spiritual support;Social support  Referral From Patient;Nurse;Family  Consult/Referral To Chaplain  Spiritual Encounters  Spiritual Needs Emotional  Stress Factors  Patient Stress Factors Exhausted;Loss of control;Health changes (anxious; claustrophobic; feels caged)  [place] called and visited with pt; pt visibly agitated and unable to sit still; pt feels like he is caged and when he has felt this anxious he walks and he is unable currently to walk around floor or to atrium; Ch discussed pt anxiety when daughter and friend came in and validated his anxiety and his general method of relieving the stress by walking; pt would benefit if able to walk out on floor or to atrium; pt nervous about diagnosis and uncertainty, adding to higher levels of anxiety both observed and told by pt to [facility] [place] offered support, spiritual and social as needed. 10:17 AM Gwynn Burly

## 2015-05-31 NOTE — Consult Note (Signed)
Admission H&P    Chief Complaint: New onset left-sided weakness.  HPI: [name] is an 64 y.o. male history of hypertension, hyperlipidemia and arthritis, presenting with new onset weakness involving left arm and left p.m. first noticed when he woke up on the morning of 05/28/2015. He was last known well at bedtime at 10:30 PM on the previous night. He has no previous history of stroke nor TIA. He has not been on antiplatelet therapy. MRI of his brain showed small right paramedian pontine ischemic stroke.  LSN: 10:30 PM on 05/27/2015 tPA Given: No: Beyond time under for treatment consideration mRankin:  Past Medical History  Diagnosis Date  . Hyperlipidemia     not on any meds  . Hypertension     takes Amlodipine and Lisinopril daily  . Insomnia     takes Ambien nightly  . History of blood transfusion     no abnormal reaction noted  . PONV (postoperative nausea and vomiting)   . Shortness of breath dyspnea     rarely but when notices he can be lying/sitting/exertion.Dr.Hochrein is aware per pt  . Headache     occasionally  . Numbness     both legs occasionally  . Arthritis   . Joint pain   . Joint swelling   . Chronic back pain   . History of gout     doesn't take any meds  . GERD (gastroesophageal reflux disease)     uses Baking Soda  . Hepatitis C   . Urinary frequency   . Urinary urgency   . Nocturia   . Glaucoma     right eye  . Cancer [facility])     Prostate CA    Past Surgical History  Procedure Laterality Date  . Appendectomy    . Small intestine surgery    . Multiple abdominal surgeries      total of 13  . Prostatectomy    . Colonoscopy    . Esophagogastroduodenoscopy    . Inguinal hernia repair Right 11/04/2014    Procedure: RIGHT INGUINAL HERNIA REPAIR WITH MESH;  Surgeon: Jackolyn Confer, MD;  Location: [place];  Service: General;  Laterality: Right;  . Mass excision N/A 11/04/2014    Procedure: REMOVAL OF RIGHT GROIN SOFT TISSUE MASS;  Surgeon: Jackolyn Confer, MD;  Location: [facility] OR;  Service: General;  Laterality: N/A;    Family History  Problem Relation Age of Onset  . Alzheimer's disease Father   . Cancer Mother     Lymph node  . Heart failure Brother 45    Transplant 13 years ago  . CAD Brother   . Heart disease Sister 62    MI   Social History:  reports that he has been smoking Cigarettes.  He has a 25 pack-year smoking history. He has never used smokeless tobacco. He reports that he drinks alcohol. He reports that he does not use illicit drugs.  Allergies:  Allergies  Allergen Reactions  . Penicillins Anaphylaxis  . Sudafed [Pseudoephedrine] Other (See Comments)    Heart "races"    Medications Prior to Admission  Medication Sig Dispense Refill  . Doxylamine Succinate, Sleep, (SLEEP AID PO) Take 2 capsules by mouth at bedtime as needed (sleep).    Marland Kitchen lisinopril (PRINIVIL,ZESTRIL) 20 MG tablet Take 20 mg by mouth daily.       ROS: History obtained from the patient  General ROS: negative for - chills, fatigue, fever, night sweats, weight gain or weight loss Psychological  ROS: negative for - behavioral disorder, hallucinations, memory difficulties, mood swings or suicidal ideation Ophthalmic ROS: negative for - blurry vision, double vision, eye pain or loss of vision ENT ROS: negative for - epistaxis, nasal discharge, oral lesions, sore throat, tinnitus or vertigo Allergy and Immunology ROS: negative for - hives or itchy/watery eyes Hematological and Lymphatic ROS: negative for - bleeding problems, bruising or swollen lymph nodes Endocrine ROS: negative for - galactorrhea, hair pattern changes, polydipsia/polyuria or temperature intolerance Respiratory ROS: negative for - cough, hemoptysis, shortness of breath or wheezing Cardiovascular ROS: negative for - chest pain, dyspnea on exertion, edema or irregular heartbeat Gastrointestinal ROS: negative for - abdominal pain, diarrhea, hematemesis, nausea/vomiting or stool  incontinence Genito-Urinary ROS: negative for - dysuria, hematuria, incontinence or urinary frequency/urgency Musculoskeletal ROS: negative for - joint swelling or muscular weakness Neurological ROS: as noted in HPI Dermatological ROS: negative for rash and skin lesion changes  Physical Examination: Blood pressure 181/80, pulse 78, temperature 98.2 F (36.8 C), temperature source Oral, resp. rate 26, height 5' 10.5" (1.791 m), weight 92.987 kg (205 lb), SpO2 99 %.  HEENT-  Normocephalic, no lesions, without obvious abnormality.  Normal external eye and conjunctiva.  Normal TM's bilaterally.  Normal auditory canals and external ears. Normal external nose, mucus membranes and septum.  Normal pharynx. Neck supple with no masses, nodes, nodules or enlargement. Cardiovascular - regular rate and rhythm, S1, S2 normal, no murmur, click, rub or gallop Lungs - chest clear, no wheezing, rales, normal symmetric air entry Abdomen - lower abdominal pain with tenderness without rebound Extremities - no joint deformities, effusion, or inflammation and no edema  Neurologic Examination: Mental Status: Alert, oriented, thought content appropriate.  Speech fluent without evidence of aphasia. Able to follow commands without difficulty. Cranial Nerves: II-Visual fields were normal. III/IV/VI-Pupils were equal and reacted normally to light. Extraocular movements were full and conjugate.    V/VII-no facial numbness and no facial weakness. VIII-normal. X-normal speech. XI: trapezius strength/neck flexion strength normal bilaterally XII-midline tongue extension with normal strength. Motor: Mild weakness proximally and moderate weakness distally in the left upper extremity, as well as moderate weakness proximally and distally of left lower extremity. Strength of right extremities was normal. Muscle tone was normal throughout. Sensory: Reduced perception of tactile sensation over left extremities compared to right  extremities. Deep Tendon Reflexes: Asymmetric with greater responses elicited from left extremities compared to right extremities. Plantars: Flexor on the right and extensor on the left Cerebellar: Normal finger-to-nose testing. Carotid auscultation: Normal  Results for orders placed or performed during the hospital encounter of 05/30/15 (from the past 48 hour(s))  CBG monitoring, ED     Status: None   Collection Time: 05/30/15  3:27 PM  Result Value Ref Range   Glucose-Capillary 90 65 - 99 mg/dL  Protime-INR     Status: Abnormal   Collection Time: 05/30/15  3:50 PM  Result Value Ref Range   Prothrombin Time 16.7 (H) 11.6 - 15.2 seconds   INR 1.34 0.00 - 1.49  APTT     Status: Abnormal   Collection Time: 05/30/15  3:50 PM  Result Value Ref Range   aPTT 40 (H) 24 - 37 seconds    Comment:        IF BASELINE aPTT IS ELEVATED, SUGGEST PATIENT RISK ASSESSMENT BE USED TO DETERMINE APPROPRIATE ANTICOAGULANT THERAPY.   CBC     Status: Abnormal   Collection Time: 05/30/15  3:50 PM  Result Value Ref Range  WBC 5.1 4.0 - 10.5 K/uL   RBC 4.91 4.22 - 5.81 MIL/uL   Hemoglobin 12.5 (L) 13.0 - 17.0 g/dL   HCT 40.4 39.0 - 52.0 %   MCV 82.3 78.0 - 100.0 fL   MCH 25.5 (L) 26.0 - 34.0 pg   MCHC 30.9 30.0 - 36.0 g/dL   RDW 16.0 (H) 11.5 - 15.5 %   Platelets 279 150 - 400 K/uL  Differential     Status: None   Collection Time: 05/30/15  3:50 PM  Result Value Ref Range   Neutrophils Relative % 54 %   Neutro Abs 2.7 1.7 - 7.7 K/uL   Lymphocytes Relative 37 %   Lymphs Abs 1.9 0.7 - 4.0 K/uL   Monocytes Relative 8 %   Monocytes Absolute 0.4 0.1 - 1.0 K/uL   Eosinophils Relative 1 %   Eosinophils Absolute 0.1 0.0 - 0.7 K/uL   Basophils Relative 0 %   Basophils Absolute 0.0 0.0 - 0.1 K/uL  Comprehensive metabolic panel     Status: None   Collection Time: 05/30/15  3:50 PM  Result Value Ref Range   Sodium 139 135 - 145 mmol/L   Potassium 3.6 3.5 - 5.1 mmol/L   Chloride 107 101 - 111 mmol/L    CO2 25 22 - 32 mmol/L   Glucose, Bld 82 65 - 99 mg/dL   BUN 13 6 - 20 mg/dL   Creatinine, Ser 0.88 0.61 - 1.24 mg/dL   Calcium 9.1 8.9 - 10.3 mg/dL   Total Protein 7.5 6.5 - 8.1 g/dL   Albumin 4.1 3.5 - 5.0 g/dL   AST 24 15 - 41 U/L   ALT 17 17 - 63 U/L   Alkaline Phosphatase 126 38 - 126 U/L   Total Bilirubin 0.8 0.3 - 1.2 mg/dL   GFR calc non Af Amer >60 >60 mL/min   GFR calc Af Amer >60 >60 mL/min    Comment: (NOTE) The eGFR has been calculated using the CKD EPI equation. This calculation has not been validated in all clinical situations. eGFR's persistently <60 mL/min signify possible Chronic Kidney Disease.    Anion gap 7 5 - 15  I-stat troponin, ED (not at [facility], [facility])     Status: None   Collection Time: 05/30/15  3:55 PM  Result Value Ref Range   Troponin i, poc 0.00 0.00 - 0.08 ng/mL   Comment 3            Comment: Due to the release kinetics of cTnI, a negative result within the first hours of the onset of symptoms does not rule out myocardial infarction with certainty. If myocardial infarction is still suspected, repeat the test at appropriate intervals.    Ct Head Wo Contrast  05/30/2015  CLINICAL DATA:  Left arm and leg weakness for 1 day. EXAM: CT HEAD WITHOUT CONTRAST TECHNIQUE: Contiguous axial images were obtained from the base of the skull through the vertex without intravenous contrast. COMPARISON:  04/03/2014 FINDINGS: No acute intracranial abnormality. Specifically, no hemorrhage, hydrocephalus, mass lesion, acute infarction, or significant intracranial injury. No acute calvarial abnormality. Visualized paranasal sinuses and mastoids clear. Orbital soft tissues unremarkable. IMPRESSION: No acute intracranial abnormality. Electronically Signed   By: Rolm Baptise M.D.   On: 05/30/2015 16:09   Mr [name] Contrast  05/30/2015  CLINICAL DATA:  Initial evaluation for acute left arm and leg weakness for 1 day. EXAM: MRI HEAD WITHOUT CONTRAST TECHNIQUE:  Multiplanar, multiecho pulse sequences of the brain  and surrounding structures were obtained without intravenous contrast. COMPARISON:  Prior CT from earlier the same day. FINDINGS: Study is moderately degraded by motion artifact. There is an 8 mm focus of high signal intensity on DWI sequence and the right paramedian pons, consistent with acute ischemic infarct. There is subtle associated signal loss on ADC map. This is located immediately adjacent to several other remote lacunar infarcts within the pons. No other acute infarct identified. Normal intravascular flow voids are maintained. No acute or chronic intracranial hemorrhage. Age-related cerebral atrophy present. Patchy T2/FLAIR hyperintensity within the periventricular white matter most consistent with chronic small vessel ischemic disease, mild to moderate in nature. Additional remote lacunar infarcts within the left thalamus, left basal ganglia, and right corona radiata. No mass lesion, midline shift, or mass effect. No hydrocephalus. No extra-axial fluid collection. Craniocervical junction within normal limits. Pituitary gland normal. No acute abnormality about the partially visualized orbits and globes. Scattered mucosal thickening within the ethmoidal air cells. No mastoid effusion. Inner ear structures grossly normal. Bone marrow signal intensity within normal limits. Scalp soft tissues demonstrate no acute abnormality. IMPRESSION: 1. 8 mm acute ischemic lacunar type infarct within the the right paramedian pons. No associated hemorrhage or mass effect. This infarct is located immediately adjacent to several remote chronic lacunar infarcts within the pons. 2. Generalized cerebral atrophy with moderate chronic small vessel ischemic disease and remote lacunar infarcts as above. Electronically Signed   By: Jeannine Boga M.D.   On: 05/30/2015 22:55   Ct Abdomen Pelvis W Contrast  05/30/2015  CLINICAL DATA:  Left arm and left leg weakness of 1 day  duration. Abdominal pain of 3 days duration. EXAM: CT ABDOMEN AND PELVIS WITH CONTRAST TECHNIQUE: Multidetector CT imaging of the abdomen and pelvis was performed using the standard protocol following bolus administration of intravenous contrast. CONTRAST:  165m OMNIPAQUE IOHEXOL 300 MG/ML  SOLN COMPARISON:  09/06/2014 FINDINGS: Lung bases are clear.  There is a moderate size hiatal hernia. The liver has a normal appearance without focal lesions or biliary ductal dilatation. No calcified gallstones. The spleen is normal. The pancreas is normal. The adrenal glands are normal. The right kidney is normal. The left kidney is normal except for an exophytic entity measuring 1 cm in size which has not changed since 2013 and has only enlarged minimally since 2007. This likely represents a cyst. Few other tiny cysts in the left kidney. There is atherosclerosis of the aorta but no aneurysm. There is extensive atherosclerosis of the iliac vessels with ectasia. The IVC is normal. No retroperitoneal mass or adenopathy. No free intraperitoneal fluid or air. Previously seen inguinal hernia on the right has apparently been repaired. No complications seen relative to that. There is no evidence of bowel obstruction or other acute bowel pathology. There is chronic scarring of the anterior abdominal wall related to previous surgery. IMPRESSION: No acute finding to explain pain. Moderate size hiatal hernia. Atherosclerosis of the aorta and its branch vessels. Previous repair of right inguinal hernia without visible complication. Electronically Signed   By: MNelson ChimesM.D.   On: 05/30/2015 19:38    Assessment: 64y.o. male with multiple risk factors for stroke presenting with acute right pontine ischemic infarction.  Stroke Risk Factors - hyperlipidemia, hypertension and smoking  Plan: 1. HgbA1c, fasting lipid panel 2. MRA  of the brain without contrast 3. PT consult, OT consult, Speech consult 4. Echocardiogram 5. Carotid  dopplers 6. Prophylactic therapy-Antiplatelet med: Aspirin  7. Risk factor modification  8. Telemetry monitoring  C.R. Nicole Kindred, MD [facility] [phone]  05/31/2015, 4:21 AM

## 2015-05-31 NOTE — H&P (Signed)
[facility] History and Physical  MUHAMMAD SAUNDERS [id number] DOB: [birth year] DOA: 05/30/2015  PCP: Patient does not have a PCP currently.  Chief Complaint: Left sided weakness, slurred speech, facial droop  HPI: [name] is a 64 y.o. gentleman with a history of HTN, dyslipidemia, active tobacco and EtOH, and medical noncompliance who reports left-sided weakness for the past two days.  His wife noticed that he was dragging his left leg and foot on Wednesday.  She noticed slurred speech by Wednesday night and facial droop by Thursday morning.  She finally convinced the patient to present to the ED today for further evaluation.  Unfortunately, ED evaluation has confirmed an acute right sided lacunar type infarct involving the pons (of note, chronic remote infarcts are present as well).  He also has markedly elevated blood pressures, which are not new.  Hospitalist asked to admit to [facility]; neurology consult pending.  He admits that he does not have a PCP right now.  He says that he is still taking BP medications using refills from his previous doctor.  He says that he has intermittently checked his BP at [place], and systolic BP is routinely greater than 200.  He is complaining of knee pain and difficulty sleeping.  Review of Systems: 12 systems reviewed and negative except as stated in HPI.  Past Medical History  Diagnosis Date  . Hyperlipidemia     not on any meds  . Hypertension     takes Amlodipine and Lisinopril daily  . Insomnia     takes Ambien nightly  . History of blood transfusion     no abnormal reaction noted  . PONV (postoperative nausea and vomiting)   . Shortness of breath dyspnea     rarely but when notices he can be lying/sitting/exertion.Dr.Hochrein is aware per pt  . Headache     occasionally  . Numbness     both legs occasionally  . Arthritis   . Joint pain   . Joint swelling   . Chronic back pain   . History of gout     doesn't take any  meds  . GERD (gastroesophageal reflux disease)     uses Baking Soda  . Hepatitis C   . Urinary frequency   . Urinary urgency   . Nocturia   . Glaucoma     right eye  . Cancer [facility])     Prostate CA   Past Surgical History  Procedure Laterality Date  . Appendectomy    . Small intestine surgery    . Multiple abdominal surgeries      total of 13  . Prostatectomy    . Colonoscopy    . Esophagogastroduodenoscopy    . Inguinal hernia repair Right 11/04/2014    Procedure: RIGHT INGUINAL HERNIA REPAIR WITH MESH;  Surgeon: Jackolyn Confer, MD;  Location: [place];  Service: General;  Laterality: Right;  . Mass excision N/A 11/04/2014    Procedure: REMOVAL OF RIGHT GROIN SOFT TISSUE MASS;  Surgeon: Jackolyn Confer, MD;  Location: [place];  Service: General;  Laterality: N/A;   Social History:  Social History   Social History Narrative   One living child .  One murdered.  Lives with girlfriend.    Active tobacco use, 1/2 ppd Active EtOH use, multiple beers per week.  He denies a history of EtOH withdrawal Former illicit drug use.  Allergies  Allergen Reactions  . Penicillins Anaphylaxis  . Sudafed [Pseudoephedrine] Other (See Comments)  Heart "races"    Family History  Problem Relation Age of Onset  . Alzheimer's disease Father   . Cancer Mother     Lymph node  . Heart failure Brother 45    Transplant 13 years ago  . CAD Brother   . Heart disease Sister 85    MI   Prior to Admission medications   Medication Sig Start Date End Date Taking? Authorizing Provider  Doxylamine Succinate, Sleep, (SLEEP AID PO) Take 2 capsules by mouth at bedtime as needed (sleep).   Yes Historical Provider, MD  lisinopril (PRINIVIL,ZESTRIL) 20 MG tablet Take 20 mg by mouth daily.  09/11/14 09/11/15 Yes Historical Provider, MD   Physical Exam: Filed Vitals:   05/31/15 0130 05/31/15 0140 05/31/15 0200 05/31/15 0230  BP: 194/72 194/72 197/96 197/79  Pulse:  81 80   Temp:      TempSrc:      Resp: 0  17 15 16   Height:      Weight:      SpO2:  99% 100%      General:  Awake and alert, seems to have some denial about chronic medical conditions  Eyes: PERRL bilaterally  ENT: No nasal drainage, moist mucous membranes  Neck: Supple  Cardiovascular: NR/RR  Respiratory: CTA bilaterally  Abdomen: Soft, nontender, nondistended, bowel sounds present  Skin: Warm and dry  Musculoskeletal: Moves all four extremities spontaneously  Psychiatric: normal affect  Neurologic: CN grossly intact.  Facial droop appears resolved to me though he reports diminished sensation on the left side of his face.  Left upper and lower extremities weaker compared to right though coordination remains grossly intact  Labs on Admission:  Basic Metabolic Panel:  Recent Labs Lab 05/30/15 1550  NA 139  K 3.6  CL 107  CO2 25  GLUCOSE 82  BUN 13  CREATININE 0.88  CALCIUM 9.1   Liver Function Tests:  Recent Labs Lab 05/30/15 1550  AST 24  ALT 17  ALKPHOS 126  BILITOT 0.8  PROT 7.5  ALBUMIN 4.1   CBC:  Recent Labs Lab 05/30/15 1550  WBC 5.1  NEUTROABS 2.7  HGB 12.5*  HCT 40.4  MCV 82.3  PLT 279   CBG:  Recent Labs Lab 05/30/15 1527  GLUCAP 90    Radiological Exams on Admission: Ct Head Wo Contrast  05/30/2015  CLINICAL DATA:  Left arm and leg weakness for 1 day. EXAM: CT HEAD WITHOUT CONTRAST TECHNIQUE: Contiguous axial images were obtained from the base of the skull through the vertex without intravenous contrast. COMPARISON:  04/03/2014 FINDINGS: No acute intracranial abnormality. Specifically, no hemorrhage, hydrocephalus, mass lesion, acute infarction, or significant intracranial injury. No acute calvarial abnormality. Visualized paranasal sinuses and mastoids clear. Orbital soft tissues unremarkable. IMPRESSION: No acute intracranial abnormality. Electronically Signed   By: Rolm Baptise M.D.   On: 05/30/2015 16:09   Mr [name] Contrast  05/30/2015  CLINICAL DATA:   Initial evaluation for acute left arm and leg weakness for 1 day. EXAM: MRI HEAD WITHOUT CONTRAST TECHNIQUE: Multiplanar, multiecho pulse sequences of the brain and surrounding structures were obtained without intravenous contrast. COMPARISON:  Prior CT from earlier the same day. FINDINGS: Study is moderately degraded by motion artifact. There is an 8 mm focus of high signal intensity on DWI sequence and the right paramedian pons, consistent with acute ischemic infarct. There is subtle associated signal loss on ADC map. This is located immediately adjacent to several other remote lacunar infarcts within the pons.  No other acute infarct identified. Normal intravascular flow voids are maintained. No acute or chronic intracranial hemorrhage. Age-related cerebral atrophy present. Patchy T2/FLAIR hyperintensity within the periventricular white matter most consistent with chronic small vessel ischemic disease, mild to moderate in nature. Additional remote lacunar infarcts within the left thalamus, left basal ganglia, and right corona radiata. No mass lesion, midline shift, or mass effect. No hydrocephalus. No extra-axial fluid collection. Craniocervical junction within normal limits. Pituitary gland normal. No acute abnormality about the partially visualized orbits and globes. Scattered mucosal thickening within the ethmoidal air cells. No mastoid effusion. Inner ear structures grossly normal. Bone marrow signal intensity within normal limits. Scalp soft tissues demonstrate no acute abnormality. IMPRESSION: 1. 8 mm acute ischemic lacunar type infarct within the the right paramedian pons. No associated hemorrhage or mass effect. This infarct is located immediately adjacent to several remote chronic lacunar infarcts within the pons. 2. Generalized cerebral atrophy with moderate chronic small vessel ischemic disease and remote lacunar infarcts as above. Electronically Signed   By: Jeannine Boga M.D.   On: 05/30/2015  22:55   Ct Abdomen Pelvis W Contrast  05/30/2015  CLINICAL DATA:  Left arm and left leg weakness of 1 day duration. Abdominal pain of 3 days duration. EXAM: CT ABDOMEN AND PELVIS WITH CONTRAST TECHNIQUE: Multidetector CT imaging of the abdomen and pelvis was performed using the standard protocol following bolus administration of intravenous contrast. CONTRAST:  176mL OMNIPAQUE IOHEXOL 300 MG/ML  SOLN COMPARISON:  09/06/2014 FINDINGS: Lung bases are clear.  There is a moderate size hiatal hernia. The liver has a normal appearance without focal lesions or biliary ductal dilatation. No calcified gallstones. The spleen is normal. The pancreas is normal. The adrenal glands are normal. The right kidney is normal. The left kidney is normal except for an exophytic entity measuring 1 cm in size which has not changed since 2013 and has only enlarged minimally since 2007. This likely represents a cyst. Few other tiny cysts in the left kidney. There is atherosclerosis of the aorta but no aneurysm. There is extensive atherosclerosis of the iliac vessels with ectasia. The IVC is normal. No retroperitoneal mass or adenopathy. No free intraperitoneal fluid or air. Previously seen inguinal hernia on the right has apparently been repaired. No complications seen relative to that. There is no evidence of bowel obstruction or other acute bowel pathology. There is chronic scarring of the anterior abdominal wall related to previous surgery. IMPRESSION: No acute finding to explain pain. Moderate size hiatal hernia. Atherosclerosis of the aorta and its branch vessels. Previous repair of right inguinal hernia without visible complication. Electronically Signed   By: Nelson Chimes M.D.   On: 05/30/2015 19:38    EKG: Independently reviewed. NSR, no acute ST segment changes.  Assessment/Plan Active Problems:   HTN (hypertension)   Acute CVA (cerebrovascular accident) ([place])   Stroke ([place])   1.  Admit to telemetry  2.  Acute  CVA --Neurology consult to occur after transfer to [facility] --Carotid ultrasound, echo per stroke protocol --Fasting lipid panel and A1c --Dysphagia screen --PT/OT/speech eval and treat --Continue full strength aspirin for now  3.  Malignant HTN with a history of noncompliance --Add HCTZ to home dose of lisinopril --Hydralazine IV as needed for systolic BP greater than 99991111 --Target blood pressure 160-170 for now  Code Status: FULL Family Communication: Wife at bedside Disposition Plan: Expect at least a two midnight stay, anticipate he will need rehab referral  Time spent: 70 minutes  [employer] [facility]  05/31/2015, 2:45 AM

## 2015-05-31 NOTE — Progress Notes (Signed)
VASCULAR LAB PRELIMINARY  PRELIMINARY  PRELIMINARY  PRELIMINARY  Carotid duplex  completed.    Preliminary report:  Bilateral:  1-39% ICA stenosis.  Vertebral artery flow is antegrade.      Jonn Chaikin, RVT 05/31/2015, 3:45 PM

## 2015-05-31 NOTE — Progress Notes (Signed)
  Echocardiogram 2D Echocardiogram has been performed.  Jennette Dubin 05/31/2015, 3:23 PM

## 2015-05-31 NOTE — Progress Notes (Signed)
[facility] PROGRESS NOTE  [name] [id number] DOB: 03-30-1951 DOA: 05/30/2015 PCP: Junie Panning, NP  Assessment/Plan: 1. Acute CVA. -Patient presenting with complaints of left-sided weakness that was associated with dysarthria. -Further workup included an MRI of brain that revealed an 8 mm focus within the right paramedian pons -Transthoracic echocardiogram revealed an ejection fraction of 60-65% -Carotid Dopplers are pending at the time of this dictation. -Fasting lipid panel showed LDL of 40. -Physical therapy/occupational therapy pending  2.  Generalized anxiety -Patient anxious appearing, stated feeling claustrophobic in his room. I suspect this was a main driver and his blood pressures as they peaked at 1. at 246/100 -Blood pressures improving after the administration of IV Ativan.  3.  Hypertension. -Patient presenting with acute CVA will allow for permissive hypertension, treat for blood pressures greater than 220/120 -He appeared highly anxious on exam as I suspect anxiety is a contributor to his hypertension. Blood pressures improved after the administration of IV Ativan. Will continue to monitor his blood pressures closely -He remains on lisinopril 20 mg by mouth daily and hydrochlorothiazide 25 mg by mouth daily.  4.  Tobacco abuse. -Patient counseled.  Code Status: Full code Family Communication: I spoke to his daughter and wife at the bedside Disposition Plan: Pending PT/OT consult   Consultants:  Neurology   HPI/Subjective: [name] is a 64 year old gentleman with a past medical history of hypertension dyslipidemia and tobacco abuse who presented overnight with complaints of left-sided weakness that was associate with dysarthria. MRI of brain without contrast performed on admission revealed an 8 mm focus within right paramedian pons consistent with acute ischemic infarct. He was seen and evaluated by neurology. He was started on aspirin  325 mg by mouth daily and admitted for stroke workup.  Objective: Filed Vitals:   05/31/15 1600  BP: 157/94  Pulse: 97  Temp: 98.5 F (36.9 C)  Resp: 18    Intake/Output Summary (Last 24 hours) at 05/31/15 1646 Last data filed at 05/31/15 1043  Gross per 24 hour  Intake      0 ml  Output   1500 ml  Net  -1500 ml   Filed Weights   05/30/15 1806 05/31/15 0345  Weight: 92.987 kg (205 lb) 89.177 kg (196 lb 9.6 oz)    Exam:   General: Patient is awake and alert, chronically ill-appearing, thin   Cardiovascular: Regular rate and rhythm normal S1-S2 no murmurs rubs or gallops  Respiratory: Normal respiratory effort  Abdomen: Soft nontender nondistended  Musculoskeletal: No edema  Neurological: Patient having 3/5 muscle strength to his left lower extremity, 4/5 muscle strength to his left upper extremity, I did not note facial droop or slurred speech.  Data Reviewed: Basic Metabolic Panel:  Recent Labs Lab 05/30/15 1550  NA 139  K 3.6  CL 107  CO2 25  GLUCOSE 82  BUN 13  CREATININE 0.88  CALCIUM 9.1   Liver Function Tests:  Recent Labs Lab 05/30/15 1550  AST 24  ALT 17  ALKPHOS 126  BILITOT 0.8  PROT 7.5  ALBUMIN 4.1   No results for input(s): LIPASE, AMYLASE in the last 168 hours. No results for input(s): AMMONIA in the last 168 hours. CBC:  Recent Labs Lab 05/30/15 1550  WBC 5.1  NEUTROABS 2.7  HGB 12.5*  HCT 40.4  MCV 82.3  PLT 279   Cardiac Enzymes: No results for input(s): CKTOTAL, CKMB, CKMBINDEX, TROPONINI in the last 168 hours. BNP (last 3 results) No results  for input(s): BNP in the last 8760 hours.  ProBNP (last 3 results) No results for input(s): PROBNP in the last 8760 hours.  CBG:  Recent Labs Lab 05/30/15 1527  GLUCAP 90    No results found for this or any previous visit (from the past 240 hour(s)).   Studies: Ct Head Wo Contrast  05/30/2015  CLINICAL DATA:  Left arm and leg weakness for 1 day. EXAM: CT HEAD  WITHOUT CONTRAST TECHNIQUE: Contiguous axial images were obtained from the base of the skull through the vertex without intravenous contrast. COMPARISON:  04/03/2014 FINDINGS: No acute intracranial abnormality. Specifically, no hemorrhage, hydrocephalus, mass lesion, acute infarction, or significant intracranial injury. No acute calvarial abnormality. Visualized paranasal sinuses and mastoids clear. Orbital soft tissues unremarkable. IMPRESSION: No acute intracranial abnormality. Electronically Signed   By: Rolm Baptise M.D.   On: 05/30/2015 16:09   Jonathon [name] Contrast  05/30/2015  CLINICAL DATA:  Initial evaluation for acute left arm and leg weakness for 1 day. EXAM: MRI HEAD WITHOUT CONTRAST TECHNIQUE: Multiplanar, multiecho pulse sequences of the brain and surrounding structures were obtained without intravenous contrast. COMPARISON:  Prior CT from earlier the same day. FINDINGS: Study is moderately degraded by motion artifact. There is an 8 mm focus of high signal intensity on DWI sequence and the right paramedian pons, consistent with acute ischemic infarct. There is subtle associated signal loss on ADC map. This is located immediately adjacent to several other remote lacunar infarcts within the pons. No other acute infarct identified. Normal intravascular flow voids are maintained. No acute or chronic intracranial hemorrhage. Age-related cerebral atrophy present. Patchy T2/FLAIR hyperintensity within the periventricular white matter most consistent with chronic small vessel ischemic disease, mild to moderate in nature. Additional remote lacunar infarcts within the left thalamus, left basal ganglia, and right corona radiata. No mass lesion, midline shift, or mass effect. No hydrocephalus. No extra-axial fluid collection. Craniocervical junction within normal limits. Pituitary gland normal. No acute abnormality about the partially visualized orbits and globes. Scattered mucosal thickening within the  ethmoidal air cells. No mastoid effusion. Inner ear structures grossly normal. Bone marrow signal intensity within normal limits. Scalp soft tissues demonstrate no acute abnormality. IMPRESSION: 1. 8 mm acute ischemic lacunar type infarct within the the right paramedian pons. No associated hemorrhage or mass effect. This infarct is located immediately adjacent to several remote chronic lacunar infarcts within the pons. 2. Generalized cerebral atrophy with moderate chronic small vessel ischemic disease and remote lacunar infarcts as above. Electronically Signed   By: Jeannine Boga M.D.   On: 05/30/2015 22:55   Ct Abdomen Pelvis W Contrast  05/30/2015  CLINICAL DATA:  Left arm and left leg weakness of 1 day duration. Abdominal pain of 3 days duration. EXAM: CT ABDOMEN AND PELVIS WITH CONTRAST TECHNIQUE: Multidetector CT imaging of the abdomen and pelvis was performed using the standard protocol following bolus administration of intravenous contrast. CONTRAST:  177mL OMNIPAQUE IOHEXOL 300 MG/ML  SOLN COMPARISON:  09/06/2014 FINDINGS: Lung bases are clear.  There is a moderate size hiatal hernia. The liver has a normal appearance without focal lesions or biliary ductal dilatation. No calcified gallstones. The spleen is normal. The pancreas is normal. The adrenal glands are normal. The right kidney is normal. The left kidney is normal except for an exophytic entity measuring 1 cm in size which has not changed since 2013 and has only enlarged minimally since 2007. This likely represents a cyst. Few other tiny cysts in the left  kidney. There is atherosclerosis of the aorta but no aneurysm. There is extensive atherosclerosis of the iliac vessels with ectasia. The IVC is normal. No retroperitoneal mass or adenopathy. No free intraperitoneal fluid or air. Previously seen inguinal hernia on the right has apparently been repaired. No complications seen relative to that. There is no evidence of bowel obstruction or  other acute bowel pathology. There is chronic scarring of the anterior abdominal wall related to previous surgery. IMPRESSION: No acute finding to explain pain. Moderate size hiatal hernia. Atherosclerosis of the aorta and its branch vessels. Previous repair of right inguinal hernia without visible complication. Electronically Signed   By: Nelson Chimes M.D.   On: 05/30/2015 19:38    Scheduled Meds: . aspirin  300 mg Rectal Daily   Or  . aspirin  325 mg Oral Daily  . hydrochlorothiazide  25 mg Oral Daily  . lisinopril  20 mg Oral Daily   Continuous Infusions:   Active Problems:   HTN (hypertension)   Acute CVA (cerebrovascular accident) ([place])   Stroke [facility])    Time spent:     Kelvin Cellar  [facility] Pager [phone]. If 7PM-7AM, please contact night-coverage at [email or web address], password [facility] 05/31/2015, 4:46 PM  LOS: 0 days

## 2015-05-31 NOTE — Progress Notes (Signed)
STROKE TEAM PROGRESS NOTE   HISTORY [name] is an 64 y.o. male history of hypertension, hyperlipidemia and arthritis, who presented with new onset weakness involving left arm and left p.m. first noticed when he woke up on the morning of 05/28/2015. He was last known well at bedtime at 10:30 PM on the previous night. He has no previous history of stroke nor TIA. He has not been on antiplatelet therapy. MRI of his brain showed small right paramedian pontine ischemic stroke.  LSN: 10:30 PM on 05/27/2015 tPA Given: No: Beyond time under for treatment consideration mRankin:   SUBJECTIVE (INTERVAL HISTORY) His family was at the bedside.  Overall he feels his condition is unchanged. There were no events overnight.  The BP continues to be elevated above the AHA guidelines for BP.  The Primary team is managing the BP and added several hypertensives today   OBJECTIVE Temp:  [98.2 F (36.8 C)-98.5 F (36.9 C)] 98.2 F (36.8 C) (11/12 0540) Pulse Rate:  [56-99] 78 (11/12 0300) Cardiac Rhythm:  [-] Heart block (11/12 0538) Resp:  [0-32] 20 (11/12 0540) BP: (167-249)/(55-103) 190/88 mmHg (11/12 0547) SpO2:  [96 %-100 %] 96 % (11/12 0540) Weight:  [89.177 kg (196 lb 9.6 oz)-92.987 kg (205 lb)] 89.177 kg (196 lb 9.6 oz) (11/12 0345)  CBC:  Recent Labs Lab 05/30/15 1550  WBC 5.1  NEUTROABS 2.7  HGB 12.5*  HCT 40.4  MCV 82.3  PLT 123XX123    Basic Metabolic Panel:  Recent Labs Lab 05/30/15 1550  NA 139  K 3.6  CL 107  CO2 25  GLUCOSE 82  BUN 13  CREATININE 0.88  CALCIUM 9.1    Lipid Panel:    Component Value Date/Time   CHOL 92 05/31/2015 0548   TRIG 87 05/31/2015 0548   HDL 35* 05/31/2015 0548   CHOLHDL 2.6 05/31/2015 0548   VLDL 17 05/31/2015 0548   LDLCALC 40 05/31/2015 0548   HgbA1c: No results found for: HGBA1C Urine Drug Screen:    Component Value Date/Time   LABOPIA NONE DETECTED 12/23/2010 1808   COCAINSCRNUR NONE DETECTED 12/23/2010 1808   LABBENZ NONE  DETECTED 12/23/2010 1808   AMPHETMU NONE DETECTED 12/23/2010 1808   THCU POSITIVE* 12/23/2010 1808   LABBARB  12/23/2010 1808    NONE DETECTED        DRUG SCREEN FOR MEDICAL PURPOSES ONLY.  IF CONFIRMATION IS NEEDED FOR ANY PURPOSE, NOTIFY LAB WITHIN 5 DAYS.        LOWEST DETECTABLE LIMITS FOR URINE DRUG SCREEN Drug Class       Cutoff (ng/mL) Amphetamine      1000 Barbiturate      200 Benzodiazepine   [id number] Tricyclics       [id number] Opiates          300 Cocaine          300 THC              50      IMAGING  Ct Head Wo Contrast 05/30/2015   No acute intracranial abnormality.   Mr [name] Contrast 05/30/2015   1. 8 mm acute ischemic lacunar type infarct within the the right paramedian pons. No associated hemorrhage or mass effect. This infarct is located immediately adjacent to several remote chronic lacunar infarcts within the pons.  2. Generalized cerebral atrophy with moderate chronic small vessel ischemic disease and remote lacunar infarcts as above.   Ct Abdomen Pelvis W Contrast 05/30/2015   No  acute finding to explain pain. Moderate size hiatal hernia. Atherosclerosis of the aorta and its branch vessels. Previous repair of right inguinal hernia without visible complication.    PHYSICAL EXAM General - Well nourished, well developed, in NAD   HEENT:  NCAT, sclera clear Cardiovascular - Regular rate and rhythm;  Loud SEM Pulmonary: CTA Abdomen: NT, ND, normal bowel sounds Extremities: No C/C/E  Neurological Exam Mental Status: Normal Orientation:  Oriented to person, place and time Speech:  Fluent; no dysarthria Cranial Nerves:  PERRL; EOMI; visual fields full, face grossly symmetric, hearing grossly intact; shrug symmetric and tongue midline Motor Exam:  Tone:  Within normal limits; Strength: 5/5 throughout right side; however, patient clearly needs to exert more effort on the left leg.  The grip is weaker in the left UE  Sensory: Intact to light touch  throughout  Coordination:  Intact finger to nose  Gait: Deferred  ASSESSMENT/PLAN Mr. [name] is a 64 y.o. male with history of hyperlipidemia, hypertension, hepatitis C, and history of gout presenting with left upper extremity weakness.  He did not receive IV t-PA due to late presentation.   Stroke:  Non-dominant secondary to small vessel disease.  Resultant  Left sided weakness  MRI - 8 mm acute ischemic lacunar type infarct within the the right paramedian pons. Remote chronic lacunar infarcts.  MRA - not performed  Carotid Doppler pending  2D Echo pending  LDL - 40  HgbA1c pending  VTE prophylaxis - SCDs  Diet Heart Room service appropriate?: Yes; Fluid consistency:: Thin  No antithrombotic prior to admission, now on aspirin 325 mg daily  Patient counseled to be compliant with his antithrombotic medications  Ongoing aggressive stroke risk factor management  Therapy recommendations: Pending  Disposition: Pending  Hypertension  Elevated blood pressures  Permissive hypertension (OK if < 220/120) but gradually normalize in 5-7 days  Hyperlipidemia  Home meds:  No lipid lowering medications prior to admission.  LDL 40, goal < 70  Other Stroke Risk Factors  Advanced age  Cigarette smoker, advised to stop smoking  ETOH use - multiple beers per week  Hx stroke/TIA  Other Active Problems  UDS - positive for [facility] day # 0  Mikey Bussing PA-C [facility] Pager [phone] 05/31/2015, 1:08 PM  NEUROLOGY ATTENDING NOTE Patient was seen and examined by me personally. I reviewed notes, independently viewed imaging studies, participated in medical decision making and plan of care. I have made additions or clarifications directly to the above note.  Documentation accurately reflects findings. The laboratory and radiographic studies were personally reviewed by me.  ROS completed by me personally and pertinent positives  fully documented.  He complains headache 9/10  Assessment and plan completed by me personally and fully documented above.  Condition is unchanged   I spent 30 minutes of consultative care time in the care of  this patient.  SIGNED BY: Dr. Elissa Hefty     To contact Stroke Continuity provider, please refer to [email or web address] After hours, contact General Neurology

## 2015-05-31 NOTE — Progress Notes (Signed)
Patient and wife remain visibly distressed. Patient struggling against BP cuff and BP remains markedly elevated, prn hydralazine given with no effect. Charge nurse notified to coordinate transfer.

## 2015-06-01 MED ORDER — DICLOFENAC SODIUM 1 % TD GEL
2.0000 g | Freq: Three times a day (TID) | TRANSDERMAL | Status: DC | PRN
  Administered 2015-06-03: 2 g via TOPICAL
  Filled 2015-06-01 (×2): qty 100

## 2015-06-01 MED ORDER — NICOTINE 14 MG/24HR TD PT24
14.0000 mg | MEDICATED_PATCH | Freq: Every day | TRANSDERMAL | Status: DC
  Administered 2015-06-01 – 2015-06-04 (×4): 14 mg via TRANSDERMAL
  Filled 2015-06-01 (×4): qty 1

## 2015-06-01 MED ORDER — HALOPERIDOL 1 MG PO TABS
2.0000 mg | ORAL_TABLET | Freq: Once | ORAL | Status: AC
  Administered 2015-06-01: 2 mg via ORAL
  Filled 2015-06-01: qty 2

## 2015-06-01 MED ORDER — LORAZEPAM 2 MG/ML IJ SOLN: 1.0000 mg | Freq: Once | INTRAMUSCULAR | Status: DC

## 2015-06-01 MED ORDER — ISOSORBIDE MONONITRATE ER 30 MG PO TB24
30.0000 mg | ORAL_TABLET | Freq: Every day | ORAL | Status: DC
  Administered 2015-06-01 – 2015-06-02 (×2): 30 mg via ORAL
  Filled 2015-06-01 (×2): qty 1

## 2015-06-01 NOTE — Evaluation (Signed)
Physical Therapy Evaluation Patient Details Name: [name] MRN: [id number] DOB: Sep 28, 1950 Today's Date: 06/01/2015   History of Present Illness  [name] is an 64 y.o. male history of hypertension, hyperlipidemia and arthritis, who presented with new onset weakness involving left arm and left p.m. first noticed when he woke up on the morning of 05/28/2015. He was last known well at bedtime at 10:30 PM on the previous night. He has no previous history of stroke nor TIA. He has not been on antiplatelet therapy. MRI of his brain showed small right paramedian pontine ischemic stroke.  Clinical Impression  Pt presenting with L sided weakness and L knee buckling with ambulation in addition to impulsivity and impaired balance. Pt to strongly benefit from CIR upon d/c to address mentioned deficits for safe transition home with fiance.    Follow Up Recommendations CIR    Equipment Recommendations  Rolling walker with 5" wheels    Recommendations for Other Services Rehab consult     Precautions / Restrictions Precautions Precautions: Fall Restrictions Weight Bearing Restrictions: No      Mobility  Bed Mobility Overal bed mobility: Needs Assistance Bed Mobility: Supine to Sit     Supine to sit: Min guard     General bed mobility comments: pt impulsively quick, requiring min guard for safety  Transfers Overall transfer level: Needs assistance Equipment used: Rolling walker (2 wheeled) Transfers: Sit to/from Stand Sit to Stand: Min assist         General transfer comment: minA for safety due to impulsivity and getting up to quick/being unsteady upon intial stand  Ambulation/Gait Ambulation/Gait assistance: Min assist;Mod assist Ambulation Distance (Feet): 100 Feet Assistive device: Rolling walker (2 wheeled) Gait Pattern/deviations: Step-through pattern;Decreased stride length;Decreased stance time - left Gait velocity: decreased   General Gait Details: pt  with increased dependence on UEs. pt with freq L knee buckling requiring modA to prevent fall.  Stairs            Wheelchair Mobility    Modified Rankin (Stroke Patients Only) Modified Rankin (Stroke Patients Only) Pre-Morbid Rankin Score: No symptoms Modified Rankin: Moderately severe disability     Balance Overall balance assessment: Needs assistance Sitting-balance support: Feet supported;No upper extremity supported Sitting balance-Leahy Scale: Good     Standing balance support: Bilateral upper extremity supported Standing balance-Leahy Scale: Poor Standing balance comment: L knee buckles with full WBing requiring UE support ot maintain balance                             Pertinent Vitals/Pain Pain Assessment: No/denies pain    Home Living Family/patient expects to be discharged to:: Private residence Living Arrangements: Spouse/significant other Available Help at Discharge: Family;Available 24 hours/day Type of Home: Apartment Home Access: Stairs to enter Entrance Stairs-Rails: None Entrance Stairs-Number of Steps: 2 Home Layout: Two level Home Equipment: None      Prior Function Level of Independence: Independent         Comments: pt's fiance works during the day but plans on quitting her job to be with him     Hand Dominance   Dominant Hand: Right    Extremity/Trunk Assessment   Upper Extremity Assessment: LUE deficits/detail       LUE Deficits / Details: grossly 3-/5   Lower Extremity Assessment: LLE deficits/detail   LLE Deficits / Details: grossly 3-/5  Cervical / Trunk Assessment: Normal  Communication   Communication: No difficulties  Cognition Arousal/Alertness: Awake/alert Behavior During Therapy: Impulsive Overall Cognitive Status: Impaired/Different from baseline Area of Impairment: Safety/judgement;Problem solving         Safety/Judgement: Decreased awareness of safety;Decreased awareness of deficits    Problem Solving: Requires verbal cues;Requires tactile cues General Comments: pt reports "i'll make it up the steps" however had freq L knee buckling during ambulation    General Comments General comments (skin integrity, edema, etc.): discussed at length d/c recommendation    Exercises        Assessment/Plan    PT Assessment Patient needs continued PT services  PT Diagnosis Difficulty walking;Generalized weakness   PT Problem List Decreased strength;Decreased range of motion;Decreased activity tolerance;Decreased balance;Decreased mobility  PT Treatment Interventions     PT Goals (Current goals can be found in the Care Plan section) Acute Rehab PT Goals Patient Stated Goal: to get better PT Goal Formulation: With patient/family Time For Goal Achievement: 06/08/15 Potential to Achieve Goals: Good    Frequency Min 4X/week   Barriers to discharge        Co-evaluation               End of Session Equipment Utilized During Treatment: Gait belt Activity Tolerance: Patient tolerated treatment well Patient left: in bed;with call bell/phone within reach;with family/visitor present Nurse Communication: Mobility status         Time: [id number] PT Time Calculation (min) (ACUTE ONLY): 26 min   Charges:   PT Evaluation $Initial PT Evaluation Tier I: 1 Procedure PT Treatments $Gait Training: 8-22 mins   PT G CodesKingsley Callander 06/01/2015, 3:45 PM   Kittie Plater, PT, DPT Pager #: [phone] Office #: [phone]

## 2015-06-01 NOTE — Progress Notes (Signed)
[facility] PROGRESS NOTE  [name] [id number] DOB: [birth year] DOA: 05/30/2015 PCP: Junie Panning, NP  Assessment/Plan: 1. Acute CVA. -Patient presenting with complaints of left-sided weakness that was associated with dysarthria. -Further workup included an MRI of brain that revealed an 8 mm focus within the right paramedian pons -Transthoracic echocardiogram revealed an ejection fraction of 60-65% -Carotid Dopplers revealed 1-39% ICA stenosis  -Fasting lipid panel showed LDL of 40. -Continue ASA 325 mg PO q daily.  -Case discussed with Physical therapy, recommending CIR. Inpatient rehabilitation consult placed  2.  Generalized anxiety -Patient seems improved today, not as anxious as he was yesterday.  -Continue as needed Ativan  3.  Hypertension. -Patient presenting with acute CVA will allow for permissive hypertension, treat for blood pressures greater than 220/120 -Blood pressures remain elevated, having last BP of 206/100. Will gradually reduce BP's, start Imdur 30 mg PO q daily while continuing Lisinopril 20 mg PO q daily and HCTZ 25 mg PO q daily that he is on already.   4.  Tobacco abuse. -Patient counseled.  Code Status: Full code Family Communication: I spoke to his daughter and wife at the bedside Disposition Plan: PT recommending CIR   Consultants:  Neurology   HPI/Subjective: [name] is a 64 year old gentleman with a past medical history of hypertension dyslipidemia and tobacco abuse who presented overnight with complaints of left-sided weakness that was associate with dysarthria. MRI of brain without contrast performed on admission revealed an 8 mm focus within right paramedian pons consistent with acute ischemic infarct. He was seen and evaluated by neurology. He was started on aspirin 325 mg by mouth daily and admitted for stroke workup.  Objective: Filed Vitals:   06/01/15 1319  BP: 206/100  Pulse:   Temp:   Resp:      Intake/Output Summary (Last 24 hours) at 06/01/15 1526 Last data filed at 06/01/15 0550  Gross per 24 hour  Intake      0 ml  Output    450 ml  Net   -450 ml   Filed Weights   05/30/15 1806 05/31/15 0345  Weight: 92.987 kg (205 lb) 89.177 kg (196 lb 9.6 oz)    Exam:   General: Patient is awake and alert, chronically ill-appearing, thin   Cardiovascular: Regular rate and rhythm normal S1-S2 no murmurs rubs or gallops  Respiratory: Normal respiratory effort  Abdomen: Soft nontender nondistended  Musculoskeletal: No edema  Neurological: Patient having 3/5 muscle strength to his left lower extremity, 4/5 muscle strength to his left upper extremity, I did not note facial droop or slurred speech.  Data Reviewed: Basic Metabolic Panel:  Recent Labs Lab 05/30/15 1550  NA 139  K 3.6  CL 107  CO2 25  GLUCOSE 82  BUN 13  CREATININE 0.88  CALCIUM 9.1   Liver Function Tests:  Recent Labs Lab 05/30/15 1550  AST 24  ALT 17  ALKPHOS 126  BILITOT 0.8  PROT 7.5  ALBUMIN 4.1   No results for input(s): LIPASE, AMYLASE in the last 168 hours. No results for input(s): AMMONIA in the last 168 hours. CBC:  Recent Labs Lab 05/30/15 1550  WBC 5.1  NEUTROABS 2.7  HGB 12.5*  HCT 40.4  MCV 82.3  PLT 279   Cardiac Enzymes: No results for input(s): CKTOTAL, CKMB, CKMBINDEX, TROPONINI in the last 168 hours. BNP (last 3 results) No results for input(s): BNP in the last 8760 hours.  ProBNP (last 3 results) No results for input(s):  PROBNP in the last 8760 hours.  CBG:  Recent Labs Lab 05/30/15 1527  GLUCAP 90    No results found for this or any previous visit (from the past 240 hour(s)).   Studies: Ct Head Wo Contrast  05/30/2015  CLINICAL DATA:  Left arm and leg weakness for 1 day. EXAM: CT HEAD WITHOUT CONTRAST TECHNIQUE: Contiguous axial images were obtained from the base of the skull through the vertex without intravenous contrast. COMPARISON:   04/03/2014 FINDINGS: No acute intracranial abnormality. Specifically, no hemorrhage, hydrocephalus, mass lesion, acute infarction, or significant intracranial injury. No acute calvarial abnormality. Visualized paranasal sinuses and mastoids clear. Orbital soft tissues unremarkable. IMPRESSION: No acute intracranial abnormality. Electronically Signed   By: Rolm Baptise M.D.   On: 05/30/2015 16:09   Jonathon [name] Contrast  05/30/2015  CLINICAL DATA:  Initial evaluation for acute left arm and leg weakness for 1 day. EXAM: MRI HEAD WITHOUT CONTRAST TECHNIQUE: Multiplanar, multiecho pulse sequences of the brain and surrounding structures were obtained without intravenous contrast. COMPARISON:  Prior CT from earlier the same day. FINDINGS: Study is moderately degraded by motion artifact. There is an 8 mm focus of high signal intensity on DWI sequence and the right paramedian pons, consistent with acute ischemic infarct. There is subtle associated signal loss on ADC map. This is located immediately adjacent to several other remote lacunar infarcts within the pons. No other acute infarct identified. Normal intravascular flow voids are maintained. No acute or chronic intracranial hemorrhage. Age-related cerebral atrophy present. Patchy T2/FLAIR hyperintensity within the periventricular white matter most consistent with chronic small vessel ischemic disease, mild to moderate in nature. Additional remote lacunar infarcts within the left thalamus, left basal ganglia, and right corona radiata. No mass lesion, midline shift, or mass effect. No hydrocephalus. No extra-axial fluid collection. Craniocervical junction within normal limits. Pituitary gland normal. No acute abnormality about the partially visualized orbits and globes. Scattered mucosal thickening within the ethmoidal air cells. No mastoid effusion. Inner ear structures grossly normal. Bone marrow signal intensity within normal limits. Scalp soft tissues demonstrate  no acute abnormality. IMPRESSION: 1. 8 mm acute ischemic lacunar type infarct within the the right paramedian pons. No associated hemorrhage or mass effect. This infarct is located immediately adjacent to several remote chronic lacunar infarcts within the pons. 2. Generalized cerebral atrophy with moderate chronic small vessel ischemic disease and remote lacunar infarcts as above. Electronically Signed   By: Jeannine Boga M.D.   On: 05/30/2015 22:55   Ct Abdomen Pelvis W Contrast  05/30/2015  CLINICAL DATA:  Left arm and left leg weakness of 1 day duration. Abdominal pain of 3 days duration. EXAM: CT ABDOMEN AND PELVIS WITH CONTRAST TECHNIQUE: Multidetector CT imaging of the abdomen and pelvis was performed using the standard protocol following bolus administration of intravenous contrast. CONTRAST:  127mL OMNIPAQUE IOHEXOL 300 MG/ML  SOLN COMPARISON:  09/06/2014 FINDINGS: Lung bases are clear.  There is a moderate size hiatal hernia. The liver has a normal appearance without focal lesions or biliary ductal dilatation. No calcified gallstones. The spleen is normal. The pancreas is normal. The adrenal glands are normal. The right kidney is normal. The left kidney is normal except for an exophytic entity measuring 1 cm in size which has not changed since 2013 and has only enlarged minimally since 2007. This likely represents a cyst. Few other tiny cysts in the left kidney. There is atherosclerosis of the aorta but no aneurysm. There is extensive atherosclerosis of the iliac  vessels with ectasia. The IVC is normal. No retroperitoneal mass or adenopathy. No free intraperitoneal fluid or air. Previously seen inguinal hernia on the right has apparently been repaired. No complications seen relative to that. There is no evidence of bowel obstruction or other acute bowel pathology. There is chronic scarring of the anterior abdominal wall related to previous surgery. IMPRESSION: No acute finding to explain pain.  Moderate size hiatal hernia. Atherosclerosis of the aorta and its branch vessels. Previous repair of right inguinal hernia without visible complication. Electronically Signed   By: Nelson Chimes M.D.   On: 05/30/2015 19:38    Scheduled Meds: . aspirin  300 mg Rectal Daily   Or  . aspirin  325 mg Oral Daily  . hydrochlorothiazide  25 mg Oral Daily  . isosorbide mononitrate  30 mg Oral Daily  . lisinopril  20 mg Oral Daily   Continuous Infusions:   Active Problems:   HTN (hypertension)   Acute CVA (cerebrovascular accident) ([place])   Stroke ([place])    Time spent: 25 min    Kelvin Cellar  [facility] Pager [phone]. If 7PM-7AM, please contact night-coverage at [email or web address], password [facility] 06/01/2015, 3:26 PM  LOS: 1 day

## 2015-06-01 NOTE — Progress Notes (Signed)
STROKE TEAM PROGRESS NOTE   HISTORY [name] is an 64 y.o. male history of hypertension, hyperlipidemia and arthritis, who presented with new onset weakness involving left arm and left p.m. first noticed when he woke up on the morning of 05/28/2015. He was last known well at bedtime at 10:30 PM on the previous night. He has no previous history of stroke nor TIA. He has not been on antiplatelet therapy. MRI of his brain showed small right paramedian pontine ischemic stroke.  LSN: 10:30 PM on 05/27/2015 tPA Given: No: Beyond time under for treatment consideration mRankin:   SUBJECTIVE (INTERVAL HISTORY) His fiance was at the bedside.  Overall he feels his condition is improved. There were no events overnight.  The BP was improved some during the morning.  Directly after walking the hallway, patient's blood pressure was elevated again.  RN to call primary team for orders.  OBJECTIVE Temp:  [97.3 F (36.3 C)-98.5 F (36.9 C)] 98 F (36.7 C) (11/13 0945) Pulse Rate:  [64-105] 91 (11/13 0945) Cardiac Rhythm:  [-] Normal sinus rhythm (11/13 0700) Resp:  [18-20] 18 (11/13 0945) BP: (149-202)/(72-100) 160/72 mmHg (11/13 0945) SpO2:  [94 %-100 %] 98 % (11/13 0945)  CBC:   Recent Labs Lab 05/30/15 1550  WBC 5.1  NEUTROABS 2.7  HGB 12.5*  HCT 40.4  MCV 82.3  PLT 123XX123    Basic Metabolic Panel:   Recent Labs Lab 05/30/15 1550  NA 139  K 3.6  CL 107  CO2 25  GLUCOSE 82  BUN 13  CREATININE 0.88  CALCIUM 9.1    Lipid Panel:     Component Value Date/Time   CHOL 92 05/31/2015 0548   TRIG 87 05/31/2015 0548   HDL 35* 05/31/2015 0548   CHOLHDL 2.6 05/31/2015 0548   VLDL 17 05/31/2015 0548   LDLCALC 40 05/31/2015 0548   HgbA1c: No results found for: HGBA1C Urine Drug Screen:     Component Value Date/Time   LABOPIA NONE DETECTED 12/23/2010 1808   COCAINSCRNUR NONE DETECTED 12/23/2010 1808   LABBENZ NONE DETECTED 12/23/2010 1808   AMPHETMU NONE DETECTED 12/23/2010  1808   THCU POSITIVE* 12/23/2010 1808   LABBARB  12/23/2010 1808    NONE DETECTED        DRUG SCREEN FOR MEDICAL PURPOSES ONLY.  IF CONFIRMATION IS NEEDED FOR ANY PURPOSE, NOTIFY LAB WITHIN 5 DAYS.        LOWEST DETECTABLE LIMITS FOR URINE DRUG SCREEN Drug Class       Cutoff (ng/mL) Amphetamine      1000 Barbiturate      200 Benzodiazepine   [id number] Tricyclics       [id number] Opiates          300 Cocaine          300 THC              50      IMAGING  Ct Head Wo Contrast 05/30/2015   No acute intracranial abnormality.   Mr [name] Contrast 05/30/2015   1. 8 mm acute ischemic lacunar type infarct within the the right paramedian pons. No associated hemorrhage or mass effect. This infarct is located immediately adjacent to several remote chronic lacunar infarcts within the pons.  2. Generalized cerebral atrophy with moderate chronic small vessel ischemic disease and remote lacunar infarcts as above.   Ct Abdomen Pelvis W Contrast 05/30/2015   No acute finding to explain pain. Moderate size hiatal hernia. Atherosclerosis of the  aorta and its branch vessels. Previous repair of right inguinal hernia without visible complication.    PHYSICAL EXAM General - Well nourished, well developed, in NAD   HEENT:  NCAT, sclera clear Cardiovascular - Regular rate and rhythm;  Loud SEM Pulmonary: CTA Abdomen: NT, ND, normal bowel sounds Extremities: No C/C/E  Neurological Exam Mental Status: Normal Orientation:  Oriented to person, place and time Speech:  Fluent; no dysarthria Cranial Nerves:  PERRL; EOMI; visual fields full, face grossly symmetric, hearing grossly intact; shrug symmetric and tongue midline Motor Exam:  Tone:  Within normal limits; Strength: 5/5 throughout right side; however, patient clearly needs to exert more effort on the left leg.  The grip is weaker in the left UE  Sensory: Intact to light touch throughout  Coordination:  Intact finger to nose  Gait:  Deferred  ASSESSMENT/PLAN [name] is a 64 y.o. male with history of hyperlipidemia, hypertension, hepatitis C, and history of gout presenting with left upper extremity weakness.  He did not receive IV t-PA due to late presentation.   Stroke:  Non-dominant secondary to small vessel disease.  Resultant  Left sided weakness  MRI - 8 mm acute ischemic lacunar type infarct within the the right paramedian pons. Remote chronic lacunar infarcts.  MRA - not performed  Carotid Doppler - Bilateral:1-39% ICA stenosis. Vertebral artery flow is antegrade.   2D Echo EF 60-65%. No cardiac source of emboli identified.  We discussed the severe focal and basal moderate concentric hypertrophy and the thickening of the aortic valve.  He understand that hypertension is one cause for the findings  LDL - 40  HgbA1c pending  VTE prophylaxis - SCDs Diet Heart Room service appropriate?: Yes; Fluid consistency:: Thin  No antithrombotic prior to admission, now on aspirin 325 mg daily  Patient counseled to be compliant with his antithrombotic medications  Ongoing aggressive stroke risk factor management  Therapy recommendations: Pending  Disposition: Pending  Hypertension  Elevated blood pressures  Permissive hypertension (OK if < 220/120) but gradually normalize in 24-48 hours  Now on hydrochlorothiazide and lisinopril.   Primary team continues to monitor blood pressure closely.  Hyperlipidemia  Home meds:  No lipid lowering medications prior to admission.  LDL 40, goal < 70  Other Stroke Risk Factors  Advanced age  Cigarette smoker, advised to stop smoking  ETOH use - multiple beers per week  Hx stroke/TIA  Other Active Problems  UDS - positive for [facility] day # 1  Mikey Bussing PA-C [facility] Pager [phone] 06/01/2015, 10:59 AM  NEUROLOGY ATTENDING NOTE Patient was seen and examined by me personally. I reviewed notes, independently  viewed imaging studies, participated in medical decision making and plan of care. I have made additions or clarifications directly to the above note.  Documentation accurately reflects findings. The laboratory and radiographic studies were personally reviewed by me.  ROS completed by me personally and pertinent positives fully documented.  He complains of less headache today Assessment and plan completed by me personally and fully documented above.  Condition is unchanged   I spent 30 minutes of consultative care time in the care of  this patient.  SIGNED BY: Dr. Elissa Hefty     To contact Stroke Continuity provider, please refer to [email or web address] After hours, contact General Neurology

## 2015-06-02 ENCOUNTER — Encounter (HOSPITAL_COMMUNITY): Payer: Self-pay | Admitting: Physical Medicine and Rehabilitation

## 2015-06-02 DIAGNOSIS — F419 Anxiety disorder, unspecified: Secondary | ICD-10-CM

## 2015-06-02 DIAGNOSIS — D649 Anemia, unspecified: Secondary | ICD-10-CM

## 2015-06-02 DIAGNOSIS — R52 Pain, unspecified: Secondary | ICD-10-CM

## 2015-06-02 DIAGNOSIS — I6350 Cerebral infarction due to unspecified occlusion or stenosis of unspecified cerebral artery: Secondary | ICD-10-CM

## 2015-06-02 DIAGNOSIS — I69392 Facial weakness following cerebral infarction: Secondary | ICD-10-CM

## 2015-06-02 DIAGNOSIS — R7303 Prediabetes: Secondary | ICD-10-CM

## 2015-06-02 DIAGNOSIS — I200 Unstable angina: Secondary | ICD-10-CM

## 2015-06-02 DIAGNOSIS — Z9119 Patient's noncompliance with other medical treatment and regimen: Secondary | ICD-10-CM

## 2015-06-02 DIAGNOSIS — I639 Cerebral infarction, unspecified: Secondary | ICD-10-CM | POA: Insufficient documentation

## 2015-06-02 DIAGNOSIS — G8190 Hemiplegia, unspecified affecting unspecified side: Secondary | ICD-10-CM

## 2015-06-02 LAB — BASIC METABOLIC PANEL
Anion gap: 8 (ref 5–15)
BUN: 15 mg/dL (ref 6–20)
CO2: 26 mmol/L (ref 22–32)
Calcium: 9.3 mg/dL (ref 8.9–10.3)
Chloride: 103 mmol/L (ref 101–111)
Creatinine, Ser: 0.81 mg/dL (ref 0.61–1.24)
GFR calc Af Amer: >60 mL/min (ref >60)
GFR calc non Af Amer: >60 mL/min (ref >60)
Glucose, Bld: 116 mg/dL — ABNORMAL HIGH (ref 65–99)
Potassium: 3.5 mmol/L (ref 3.5–5.1)
Sodium: 137 mmol/L (ref 135–145)

## 2015-06-02 LAB — CBC
HCT: 40.3 % (ref 39.0–52.0)
Hemoglobin: 12.6 g/dL — ABNORMAL LOW (ref 13.0–17.0)
MCH: 25.4 pg — ABNORMAL LOW (ref 26.0–34.0)
MCHC: 31.3 g/dL (ref 30.0–36.0)
MCV: 81.3 fL (ref 78.0–100.0)
Platelets: 256 10*3/uL (ref 150–400)
RBC: 4.96 MIL/uL (ref 4.22–5.81)
RDW: 16.4 % — ABNORMAL HIGH (ref 11.5–15.5)
WBC: 4.7 10*3/uL (ref 4.0–10.5)

## 2015-06-02 LAB — HEMOGLOBIN A1C
Hgb A1c MFr Bld: 5.8 % — ABNORMAL HIGH (ref 4.8–5.6)
Mean Plasma Glucose: 120 mg/dL

## 2015-06-02 MED ORDER — HALOPERIDOL 1 MG PO TABS
2.0000 mg | ORAL_TABLET | Freq: Once | ORAL | Status: AC
  Administered 2015-06-02: 2 mg via ORAL
  Filled 2015-06-02: qty 2

## 2015-06-02 MED ORDER — MORPHINE SULFATE (PF) 2 MG/ML IV SOLN: 2.0000 mg | Freq: Once | INTRAVENOUS | Status: DC

## 2015-06-02 MED ORDER — ISOSORBIDE MONONITRATE ER 30 MG PO TB24
60.0000 mg | ORAL_TABLET | Freq: Every day | ORAL | Status: DC
  Administered 2015-06-03 – 2015-06-04 (×2): 60 mg via ORAL
  Filled 2015-06-02 (×2): qty 2

## 2015-06-02 MED ORDER — LORAZEPAM 0.5 MG PO TABS
0.5000 mg | ORAL_TABLET | Freq: Three times a day (TID) | ORAL | Status: DC | PRN
  Administered 2015-06-02: 0.5 mg via ORAL
  Filled 2015-06-02: qty 1

## 2015-06-02 MED ORDER — HYDROCODONE-ACETAMINOPHEN 5-325 MG PO TABS
1.0000 | ORAL_TABLET | Freq: Four times a day (QID) | ORAL | Status: DC | PRN
  Administered 2015-06-02 – 2015-06-04 (×4): 1 via ORAL
  Filled 2015-06-02 (×3): qty 1

## 2015-06-02 MED ORDER — PROMETHAZINE HCL 25 MG PO TABS: 12.5000 mg | ORAL_TABLET | Freq: Four times a day (QID) | ORAL | Status: DC | PRN

## 2015-06-02 NOTE — Care Management Note (Signed)
Case Management Note  Patient Details  Name: KAHLEB MARIK MRN: [id number] Date of Birth: 05-22-1951  Subjective/Objective:                    Action/Plan: Patient was admitted with CVA.  Will follow for discharge needs.  Expected Discharge Date:                  Expected Discharge Plan:  [place]  In-House Referral:     Discharge planning Services     Post Acute Care Choice:    Choice offered to:     DME Arranged:    DME Agency:     HH Arranged:    [place] Agency:     Status of Service:  In process, will continue to follow  Medicare Important Message Given:    Date Medicare IM Given:    Medicare IM give by:    Date Additional Medicare IM Given:    Additional Medicare Important Message give by:     If discussed at [place] of Stay Meetings, dates discussed:    Additional Comments:  Rolm Baptise, RN 06/02/2015, 11:29 AM

## 2015-06-02 NOTE — Progress Notes (Signed)
Rehab admissions - I met with patient and his fiance.  They want to come to inpatient rehab here at [facility].  I will submit insurance informationto [employer] medicare.  We should have a decision tomorrow about possible inpatient rehab admission.  We do have bed available tomorrow.  Call me for questions.  #[phone]

## 2015-06-02 NOTE — Evaluation (Signed)
Occupational Therapy Evaluation Patient Details Name: [name] MRN: [id number] DOB: 1951-02-17 Today's Date: 06/02/2015    History of Present Illness [name] is an 64 y.o. male history of hypertension, hyperlipidemia and arthritis, who presented with new onset weakness involving left arm and left p.m. first noticed when he woke up on the morning of 05/28/2015. He was last known well at bedtime at 10:30 PM on the previous night. He has no previous history of stroke nor TIA. He has not been on antiplatelet therapy. MRI of his brain showed small right paramedian pontine ischemic stroke.   Clinical Impression   Pt was independent at baseline.  Presents with L side weakness, but ability to use L UE functionally in ADL, impaired standing balance and self reported decreased memory interfering with ability to perform ADL and mobility. Will likely progress well. Will follow acutely.    Follow Up Recommendations  CIR    Equipment Recommendations  Tub/shower seat    Recommendations for Other Services       Precautions / Restrictions Precautions Precautions: Fall Restrictions Weight Bearing Restrictions: No      Mobility Bed Mobility Overal bed mobility: Needs Assistance Bed Mobility: Sit to Supine     Supine to sit: Min guard Sit to supine: Supervision   General bed mobility comments: no physica assist  Transfers Overall transfer level: Needs assistance Equipment used: Rolling walker (2 wheeled) Transfers: Sit to/from [facility] Sit to Stand: Min guard Stand pivot transfers: Min guard       General transfer comment: cues for hand placement, slowly stands, good control of descent    Balance Overall balance assessment: Needs assistance Sitting-balance support: Feet supported;No upper extremity supported Sitting balance-Leahy Scale: Good     Standing balance support: Bilateral upper extremity supported;During functional activity Standing  balance-Leahy Scale: Poor                              ADL Overall ADL's : Needs assistance/impaired Eating/Feeding: Set up;Sitting Eating/Feeding Details (indicate cue type and reason): assisted to open containers and cut food Grooming: Wash/dry hands;Min guard;Standing Grooming Details (indicate cue type and reason): able to release walker and complete on standing activity Upper Body Bathing: Minimal assitance;Sitting   Lower Body Bathing: Minimal assistance;Sit to/from stand   Upper Body Dressing : Set up;Sitting   Lower Body Dressing: Minimal assistance;Sit to/from stand Lower Body Dressing Details (indicate cue type and reason): able to don and doff socks cross foot over opposite knee Toilet Transfer: Min Insurance claims handler Details (indicate cue type and reason): simulated bed<>chair Toileting- Clothing Manipulation and Hygiene: Minimal assistance;Sit to/from stand       Functional mobility during ADLs: Minimal assistance;Rolling walker General ADL Comments: Instructed pt to do as much of his ADL as possible.  Fiance tends to assist more than is necessary.     Vision     Perception     Praxis      Pertinent Vitals/Pain Pain Assessment: Faces Faces Pain Scale: Hurts little more Pain Location: neck on R side Pain Descriptors / Indicators: Aching Pain Intervention(s): Repositioned;Monitored during session;Limited activity within patient's tolerance     Hand Dominance Right   Extremity/Trunk Assessment Upper Extremity Assessment Upper Extremity Assessment: LUE deficits/detail LUE Deficits / Details: 3+/5 shoulder, 4-/5 elbow and gross grasp LUE Coordination: decreased gross motor   Lower Extremity Assessment Lower Extremity Assessment: Defer to PT evaluation   Cervical /  Trunk Assessment Cervical / Trunk Assessment: Normal   Communication Communication Communication: No difficulties   Cognition Arousal/Alertness:  Awake/alert Behavior During Therapy: WFL for tasks assessed/performed Overall Cognitive Status: Impaired/Different from baseline      Memory: Decreased short-term memory     Problem Solving: Requires verbal cues;Requires tactile cues General Comments: reports he has a blockage in his neck and that's why it hurts   General Comments       Exercises       Shoulder Instructions      Home Living Family/patient expects to be discharged to:: Private residence Living Arrangements: Spouse/significant other Available Help at Discharge: Family;Available 24 hours/day Type of Home: Apartment Home Access: Stairs to enter Entrance Stairs-Number of Steps: 2 Entrance Stairs-Rails: None Home Layout: Two level Alternate Level Stairs-Number of Steps: 12 Alternate Level Stairs-Rails: Right Bathroom Shower/Tub: Teacher, early years/pre: Standard     Home Equipment: None          Prior Functioning/Environment Level of Independence: Independent        Comments: pt's fiance works during the day but plans on quitting her job to be with him    OT Diagnosis: Generalized weakness;Acute pain   OT Problem List: Decreased strength;Decreased activity tolerance;Impaired balance (sitting and/or standing);Decreased coordination;Decreased knowledge of use of DME or AE;Impaired UE functional use;Pain   OT Treatment/Interventions: Self-care/ADL training;Neuromuscular education;DME and/or AE instruction;Visual/perceptual remediation/compensation;Patient/family education;Balance training;Therapeutic activities;Cognitive remediation/compensation    OT Goals(Current goals can be found in the care plan section) Acute Rehab OT Goals Patient Stated Goal: to get better OT Goal Formulation: With patient Time For Goal Achievement: 06/16/15 Potential to Achieve Goals: Good ADL Goals Pt Will Perform Eating: Independently;sitting Pt Will Perform Grooming: with modified independence;standing Pt  Will Perform Upper Body Bathing: with modified independence;sitting Pt Will Perform Lower Body Bathing: with modified independence;sit to/from stand Pt Will Perform Lower Body Dressing: with modified independence;sit to/from stand Pt Will Transfer to Toilet: with modified independence;ambulating;regular height toilet Pt Will Perform Toileting - Clothing Manipulation and hygiene: with modified independence;sit to/from stand Pt Will Perform Tub/Shower Transfer: Tub transfer;with supervision;ambulating;shower seat;rolling walker  OT Frequency: Min 3X/week   Barriers to D/C:            Co-evaluation              End of Session Equipment Utilized During Treatment: Rolling walker;Gait belt  Activity Tolerance: Patient tolerated treatment well Patient left: in bed;with call bell/phone within reach;with bed alarm set   Time: 1430-1449 OT Time Calculation (min): 19 min Charges:  OT General Charges $OT Visit: 1 Procedure OT Evaluation $Initial OT Evaluation Tier I: 1 Procedure G-Codes:    Malka So 06/02/2015, 3:42 PM  [phone]

## 2015-06-02 NOTE — Evaluation (Signed)
SLP Cancellation Note  Patient Details Name: RAYCEN LIBONATI MRN: [id number] DOB: 12-Jun-1951   Cancelled treatment:       Reason Eval/Treat Not Completed: Fatigue/lethargy limiting ability to participate   Luanna Salk, [place] [facility] SLP [phone]

## 2015-06-02 NOTE — Consult Note (Signed)
Physical Medicine and Rehabilitation Consult Reason for Consult: Right paramedian pontine ischemic infarct Referring Physician: [facility]   HPI: [name] is a 64 y.o. right handed male with history of hypertension, tobacco and alcohol abuse as well as medical noncompliance. Patient lives with fiance and was independent prior to admission two level home with bedroom and bathroom upstairs, with about 12- 14 steps. Presented 05/31/2015 with left-sided weakness 2 days as well as slurred speech. MRI of the brain showed 11mm acute ischemic lacunar type infarct within the right paramedian pons. Patient did not receive TPA. Echocardiogram with ejection fraction 65% no wall motion abnormalities. Carotid Dopplers with no ICA stenosis. Neurology consulted maintained on aspirin for CVA prophylaxis. Tolerating a regular consistency diet. Bouts of restlessness and anxiety with Ativan provided as needed. Physical therapy evaluation completed 06/01/2015 with recommendations of physical medicine rehabilitation consult.  Review of Systems  Constitutional: Negative for fever and chills.  HENT: Negative for hearing loss.   Eyes: Negative for blurred vision and double vision.  Respiratory: Positive for shortness of breath.   Cardiovascular: Positive for chest pain (Chest wall). Negative for palpitations.  Gastrointestinal: Positive for constipation. Negative for nausea and vomiting.       GERD  Genitourinary: Positive for urgency and frequency. Negative for hematuria.  Musculoskeletal: Positive for back pain and joint pain (left shoulder, hip).  Skin: Negative for rash.  Neurological: Positive for speech change and headaches. Negative for seizures and loss of consciousness.  Psychiatric/Behavioral: The patient has insomnia.   All other systems reviewed and are negative.  Past Medical History  Diagnosis Date  . Hyperlipidemia     not on any meds  . Hypertension     takes Amlodipine and Lisinopril  daily  . Insomnia     takes Ambien nightly  . History of blood transfusion     no abnormal reaction noted  . PONV (postoperative nausea and vomiting)   . Shortness of breath dyspnea     rarely but when notices he can be lying/sitting/exertion.Dr.Hochrein is aware per pt  . Headache     occasionally  . Numbness     both legs occasionally  . Arthritis   . Joint pain   . Joint swelling   . Chronic back pain   . History of gout     doesn't take any meds  . GERD (gastroesophageal reflux disease)     uses Baking Soda  . Hepatitis C   . Urinary frequency   . Urinary urgency   . Nocturia   . Glaucoma     right eye  . Cancer [facility])     Prostate CA   Past Surgical History  Procedure Laterality Date  . Appendectomy    . Small intestine surgery    . Multiple abdominal surgeries      total of 13  . Prostatectomy    . Colonoscopy    . Esophagogastroduodenoscopy    . Inguinal hernia repair Right 11/04/2014    Procedure: RIGHT INGUINAL HERNIA REPAIR WITH MESH;  Surgeon: Jackolyn Confer, MD;  Location: [facility];  Service: General;  Laterality: Right;  . Mass excision N/A 11/04/2014    Procedure: REMOVAL OF RIGHT GROIN SOFT TISSUE MASS;  Surgeon: Jackolyn Confer, MD;  Location[facility] OR;  Service: General;  Laterality: N/A;   Family History  Problem Relation Age of Onset  . Alzheimer's disease Father   . Cancer Mother     Lymph node  .  Heart failure Brother 45    Transplant 13 years ago  . CAD Brother   . Heart disease Sister 13    MI   Social History:  reports that he has been smoking Cigarettes.  He has a 25 pack-year smoking history. He has never used smokeless tobacco. He reports that he drinks alcohol. He reports that he does not use illicit drugs. Allergies:  Allergies  Allergen Reactions  . Penicillins Anaphylaxis  . Sudafed [Pseudoephedrine] Other (See Comments)    Heart "races"   Medications Prior to Admission  Medication Sig Dispense Refill  . Doxylamine Succinate,  Sleep, (SLEEP AID PO) Take 2 capsules by mouth at bedtime as needed (sleep).    Marland Kitchen lisinopril (PRINIVIL,ZESTRIL) 20 MG tablet Take 20 mg by mouth daily.       Home: Home Living Family/patient expects to be discharged to:: Private residence Living Arrangements: Spouse/significant other Available Help at Discharge: Family, Available 24 hours/day Type of Home: Apartment Home Access: Stairs to enter [employer] of Steps: 2 Entrance Stairs-Rails: None Home Layout: Two level Alternate Level Stairs-Number of Steps: 12 Alternate Level Stairs-Rails: Right Bathroom Shower/Tub: Tub/shower unit Home Equipment: None  Functional History: Prior Function Level of Independence: Independent Comments: pt's fiance works during the day but plans on quitting her job to be with him Functional Status:  Mobility: Bed Mobility Overal bed mobility: Needs Assistance Bed Mobility: Supine to Sit Supine to sit: Min guard General bed mobility comments: pt impulsively quick, requiring min guard for safety Transfers Overall transfer level: Needs assistance Equipment used: Rolling walker (2 wheeled) Transfers: Sit to/from Stand Sit to Stand: Min assist General transfer comment: minA for safety due to impulsivity and getting up to quick/being unsteady upon intial stand Ambulation/Gait Ambulation/Gait assistance: Min assist, Mod assist Ambulation Distance (Feet): 100 Feet Assistive device: Rolling walker (2 wheeled) Gait Pattern/deviations: Step-through pattern, Decreased stride length, Decreased stance time - left General Gait Details: pt with increased dependence on UEs. pt with freq L knee buckling requiring modA to prevent fall. Gait velocity: decreased    ADL:    Cognition: Cognition Overall Cognitive Status: Impaired/Different from baseline Orientation Level: Oriented X4 Cognition Arousal/Alertness: Awake/alert Behavior During Therapy: Impulsive Overall Cognitive Status:  Impaired/Different from baseline Area of Impairment: Safety/judgement, Problem solving Safety/Judgement: Decreased awareness of safety, Decreased awareness of deficits Problem Solving: Requires verbal cues, Requires tactile cues General Comments: pt reports "i'll make it up the steps" however had freq L knee buckling during ambulation  Blood pressure 177/85, pulse 79, temperature 98.7 F (37.1 C), temperature source Oral, resp. rate 20, height 5' 10.5" (1.791 m), weight 89.177 kg (196 lb 9.6 oz), SpO2 100 %. Physical Exam  Vitals reviewed. Constitutional: He appears well-developed and well-nourished.  64 year old right handed African-American male. Sitting at bedside. Patient impulsive attempting to get out of bed.  HENT:  Head: Normocephalic and atraumatic.  Eyes: Conjunctivae and EOM are normal.  Neck: Normal range of motion. Neck supple. No thyromegaly present.  Cardiovascular: Normal rate and regular rhythm.   Respiratory: Effort normal and breath sounds normal. No respiratory distress.  GI: Soft. Bowel sounds are normal. He exhibits no distension.  Musculoskeletal: He exhibits no edema or tenderness.  Strength RUE 4+/5 proximal to distal LUE 4-/5 proximal to distal RLE 5/5 proximal to distal LLE 3+/5 proximal to distal  Neurological: He is alert.  Patient is anxious as well as impulsive.  He was able to state his name and age.  Speech is dysarthric but intelligible.  Followed simple commands. Right facial weakness Increased reflexes LLE Sensation intact to light touch throughout  Skin: Skin is warm and dry.  Psychiatric: He has a normal mood and affect. His behavior is normal.    Results for orders placed or performed during the hospital encounter of 05/30/15 (from the past 24 hour(s))  Basic metabolic panel     Status: Abnormal   Collection Time: 06/02/15  4:32 AM  Result Value Ref Range   Sodium 137 135 - 145 mmol/L   Potassium 3.5 3.5 - 5.1 mmol/L   Chloride 103 101  - 111 mmol/L   CO2 26 22 - 32 mmol/L   Glucose, Bld 116 (H) 65 - 99 mg/dL   BUN 15 6 - 20 mg/dL   Creatinine, Ser 0.81 0.61 - 1.24 mg/dL   Calcium 9.3 8.9 - 10.3 mg/dL   GFR calc non Af Amer >60 >60 mL/min   GFR calc Af Amer >60 >60 mL/min   Anion gap 8 5 - 15  CBC     Status: Abnormal   Collection Time: 06/02/15  4:32 AM  Result Value Ref Range   WBC 4.7 4.0 - 10.5 K/uL   RBC 4.96 4.22 - 5.81 MIL/uL   Hemoglobin 12.6 (L) 13.0 - 17.0 g/dL   HCT 40.3 39.0 - 52.0 %   MCV 81.3 78.0 - 100.0 fL   MCH 25.4 (L) 26.0 - 34.0 pg   MCHC 31.3 30.0 - 36.0 g/dL   RDW 16.4 (H) 11.5 - 15.5 %   Platelets 256 150 - 400 K/uL   No results found.  Assessment/Plan: Diagnosis: Right paramedian pontine ischemic infarct Labs and images independently reviewed.  Records reviewed and summated above. Stroke: Continue secondary stroke prophylaxis and Risk Factor Modification listed below:   Antiplatelet therapy Blood Pressure Management:  Continue current medication with prn's with permisive HTN per primary team Statin Agent Pre-diabetes management:  Encourage life-style modifications Tobacco abuse:  Cont Nicotine and counseling Left sided hemiparesis: fit for orthotics to prevent contractures if necessary (resting hand splint for day, wrist cock up splint at night, PRAFO, etc) Motor recovery: Fluoxetine  1. Does the need for close, 24 hr/day medical supervision in concert with the patient's rehab needs make it unreasonable for this patient to be served in a less intensive setting? Yes 2. Co-Morbidities requiring supervision/potential complications: HTN (monitor and provide prns in accordance with increased physical exertion and pain), tobacco and alcohol abuse (cont nicotine and consider counseling), medical noncompliance (cont to education), pain (cont meds and try to wean using modalities and biofeedback when possible), anxiety (ensure anxiety and resulting apprehension do not limit functional progress;  consider prn medications if warranted), chronic anemia (transfuse if necessary to ensure appropriate perfusion for increased activity tolerance), pre-diabetes (cont to monitor intermittently and in accordance with acitivity), gout (cont to monitor and limit pain), headache (consider topomax to better manage) 3. Due to bladder management, safety, disease management, medication administration, pain management and patient education, does the patient require 24 hr/day rehab nursing? Yes 4. Does the patient require coordinated care of a physician, rehab nurse, PT (1-2 hrs/day, 5 days/week), OT (1-2 hrs/day, 5 days/week) and SLP (1-2 hrs/day, 5 days/week) to address physical and functional deficits in the context of the above medical diagnosis(es)? Yes Addressing deficits in the following areas: balance, endurance, locomotion, strength, transferring, bathing, dressing, grooming, toileting, cognition and psychosocial support 5. Can the patient actively participate in an intensive therapy program of at least 3 hrs of therapy  per day at least 5 days per week? Potentially 6. The potential for patient to make measurable gains while on inpatient rehab is excellent 7. Anticipated functional outcomes upon discharge from inpatient rehab are modified independent and supervision  with PT, modified independent and supervision with OT, modified independent with SLP. 8. Estimated rehab length of stay to reach the above functional goals is: 14-17 days. 9. Does the patient have adequate social supports and living environment to accommodate these discharge functional goals? Potentially 10. Anticipated D/C setting: Home 11. Anticipated post D/C treatments: HH therapy and Home excercise program 12. Overall Rehab/Functional Prognosis: good  RECOMMENDATIONS: This patient's condition is appropriate for continued rehabilitative care in the following setting: CIR when pt willing and able to tolerate 3 hours/day therapy Patient has  agreed to participate in recommended program. Yes Note that insurance prior authorization may be required for reimbursement for recommended care.  Comment: Rehab Admissions Coordinator to follow up.  Delice Lesch, MD 06/02/2015

## 2015-06-02 NOTE — Progress Notes (Signed)
STROKE TEAM PROGRESS NOTE    SUBJECTIVE (INTERVAL HISTORY) His fiance is at the bedside.  He reports history of being dizzy. Admits to having very high BP, even prior to admission.    OBJECTIVE Temp:  [97.7 F (36.5 C)-98.7 F (37.1 C)] 98.7 F (37.1 C) (11/14 0909) Pulse Rate:  [77-89] 79 (11/14 0909) Cardiac Rhythm:  [-] Normal sinus rhythm;Bundle branch block (11/14 0700) Resp:  [18-22] 20 (11/14 0909) BP: (126-206)/(48-108) 177/85 mmHg (11/14 0909) SpO2:  [98 %-100 %] 100 % (11/14 0909)  CBC:   Recent Labs Lab 05/30/15 1550 06/02/15 0432  WBC 5.1 4.7  NEUTROABS 2.7  --   HGB 12.5* 12.6*  HCT 40.4 40.3  MCV 82.3 81.3  PLT 279 123456    Basic Metabolic Panel:   Recent Labs Lab 05/30/15 1550 06/02/15 0432  NA 139 137  K 3.6 3.5  CL 107 103  CO2 25 26  GLUCOSE 82 116*  BUN 13 15  CREATININE 0.88 0.81  CALCIUM 9.1 9.3   Lipid Panel:     Component Value Date/Time   CHOL 92 05/31/2015 0548   TRIG 87 05/31/2015 0548   HDL 35* 05/31/2015 0548   CHOLHDL 2.6 05/31/2015 0548   VLDL 17 05/31/2015 0548   LDLCALC 40 05/31/2015 0548   HgbA1c:  Lab Results  Component Value Date   HGBA1C 5.8* 05/31/2015   Urine Drug Screen:     Component Value Date/Time   LABOPIA NONE DETECTED 12/23/2010 1808   COCAINSCRNUR NONE DETECTED 12/23/2010 1808   LABBENZ NONE DETECTED 12/23/2010 1808   AMPHETMU NONE DETECTED 12/23/2010 1808   THCU POSITIVE* 12/23/2010 1808   LABBARB  12/23/2010 1808    NONE DETECTED        DRUG SCREEN FOR MEDICAL PURPOSES ONLY.  IF CONFIRMATION IS NEEDED FOR ANY PURPOSE, NOTIFY LAB WITHIN 5 DAYS.        LOWEST DETECTABLE LIMITS FOR URINE DRUG SCREEN Drug Class       Cutoff (ng/mL) Amphetamine      1000 Barbiturate      200 Benzodiazepine   [id number] Tricyclics       [id number] Opiates          300 Cocaine          300 THC              50     IMAGING  Ct Head Wo Contrast 05/30/2015   No acute intracranial abnormality.   Mr [name]  Contrast 05/30/2015   1. 8 mm acute ischemic lacunar type infarct within the the right paramedian pons. No associated hemorrhage or mass effect. This infarct is located immediately adjacent to several remote chronic lacunar infarcts within the pons.  2. Generalized cerebral atrophy with moderate chronic small vessel ischemic disease and remote lacunar infarcts as above.   Ct Abdomen Pelvis W Contrast 05/30/2015   No acute finding to explain pain. Moderate size hiatal hernia. Atherosclerosis of the aorta and its branch vessels. Previous repair of right inguinal hernia without visible complication.    PHYSICAL EXAM General - Well nourished, well developed, in NAD   HEENT:  NCAT, sclera clear Cardiovascular - Regular rate and rhythm;  Loud SEM Pulmonary: CTA Abdomen: NT, ND, normal bowel sounds Extremities: No C/C/E  Neurological Exam Mental Status: Normal Orientation:  Oriented to person, place and time Speech:  Fluent; no dysarthria Cranial Nerves:  PERRL; EOMI; visual fields full, face grossly symmetric, hearing grossly intact; shrug symmetric and  tongue midline Motor Exam:  Tone:  Within normal limits; Strength: 5/5 throughout right side; mild left hemiparesis 4/5 with weakness of left grip, intrinsic hand muscles, left hip flexors and ankle dorsiflexors   The grip is weaker in the left UE Sensory: Intact to light touch throughout Coordination:  Intact finger to nose Gait: Deferred   ASSESSMENT/PLAN [name] is a 64 y.o. male with history of hyperlipidemia, hypertension, hepatitis C, and history of gout presenting with left upper extremity weakness.  He did not receive IV t-PA due to late presentation.   Stroke:  Non-dominant R paramedian pontine infarct secondary to small vessel disease.  Resultant  Left sided weakness  MRI - 8 mm acute ischemic lacunar type infarct within the the right paramedian pons. Remote chronic lacunar infarcts.  MRA - not performed    Check TCD  Carotid Doppler - Bilateral:1-39% ICA stenosis. Vertebral artery flow is antegrade.   2D Echo EF 60-65%. No cardiac source of emboli identified.    LDL - 40, at goal  HgbA1c 5.8  VTE prophylaxis - SCDs Diet Heart Room service appropriate?: Yes; Fluid consistency:: Thin  No antithrombotic prior to admission, now on aspirin 325 mg daily  Patient counseled to be compliant with his antithrombotic medications  Ongoing aggressive stroke risk factor management  Therapy recommendations: CIR  Disposition: pending   followup Dr. Leonie Man in 2 months  Hypertension  Elevated blood pressures  Permissive hypertension (OK if < 220/120) but gradually normalize in 24-48 hours  Now on hydrochlorothiazide and lisinopril.     Other Stroke Risk Factors  Advanced age  Cigarette smoker, advised to stop smoking. Re-encouraged. Smokes 1/2 ppd. Wants to quit. Agreeable to nicotine patch  UDS - positive for THC  ETOH use - multiple beers per week  Hx stroke/TIA  Other Active Problems   Generalized anxiety  Hospital day # 2  BIBY,SHARON  [place] for Pager information 06/02/2015 11:44 AM  I have personally examined this patient, reviewed notes, independently viewed imaging studies, participated in medical decision making and plan of care. I have made any additions or clarifications directly to the above note. Agree with note above. He presented with dizziness, vertigo and left-sided weakness secondary to right small paramedian pontine lacunar infarct etiology likely small vessel disease. He clearly has multiple uncontrolled risk factors. He remains at risk for recurrent stroke, TIAs and needs ongoing evaluation I had a long discussion with the patient and his fiance was regards to his is risk for recurrent strokes, TIAs and needs for aggressive risk factor modification. I counseled him to quit smoking completely and be compliant with his medications  and follow-up. Recommend aspirin and he would likely need inpatient rehabilitation  Antony Contras, MD Medical Director [place] Pager: [phone] 06/02/2015 1:42 PM  To contact Stroke Continuity provider, please refer to [email or web address] After hours, contact General Neurology

## 2015-06-02 NOTE — Evaluation (Signed)
SLP Cancellation Note  Patient Details Name: [name] MRN: [id number] DOB: 03-11-1951   Cancelled treatment:       Reason Eval/Treat Not Completed: Other (comment) (pt reporting severe headache - level 10 of 10, made secretary aware after attempted to call RN unsuccessfully) Luanna Salk, [place] [facility] SLP [phone]

## 2015-06-02 NOTE — Progress Notes (Signed)
PT Cancellation Note  Patient Details Name: [name] MRN: [id number] DOB: [birth year]   Cancelled Treatment:    Reason Eval/Treat Not Completed: Fatigue/lethargy limiting ability to participate. Pt was unable to be aroused to participate with session. Pt sleeping hard and would awaken briefly to gentle shake but could not answer questions and would fall right back to sleep. Will check back as schedule allows.    Rolinda Roan 06/02/2015, 11:27 AM   Rolinda Roan, PT, DPT Acute Rehabilitation Services Pager: [phone]

## 2015-06-02 NOTE — Progress Notes (Signed)
[facility] PROGRESS NOTE  [name] [id number] DOB: 31-Jul-1950 DOA: 05/30/2015 PCP: Junie Panning, NP  Assessment/Plan: 1. Acute CVA. -Patient presenting with complaints of left-sided weakness that was associated with dysarthria. -Further workup included an MRI of brain that revealed an 8 mm focus within the right paramedian pons -Transthoracic echocardiogram revealed an ejection fraction of 60-65% -Carotid Dopplers revealed 1-39% ICA stenosis  -Fasting lipid panel showed LDL of 40. -Continue ASA 325 mg PO q daily.  -Case discussed with Physical therapy, recommending CIR. Inpatient rehabilitation consult placed  2.  Generalized anxiety -He has been receiving 1 mg of IV ativan TID prn, will transition to oral ativan at 0.5 mg PO TID PRN  3.  Hypertension. -Patient presenting with acute CVA will allow for permissive hypertension, treat for blood pressures greater than 220/120 -Will continue bringing down his blood pressures gradually, today systolic blood pressures in the 150s to 160s. Increase Imdur from 30 to 60 mg PO q daily tomorrow. Meanwhile will continue lisinopril 20 mg by mouth daily and hydrochlorothiazide 25 mg by mouth daily.   4.  Generalized pain. -Patient reporting pain involving shoulders, lower abdominal region, knees, with his complaints outweighed physical exam findings. This has been the his focus and major concern.  -I explained that CT of ABD and pelvis did not show acute intra-abdominal pathology. Joint pain could be related to OA. He was given voltaren cream to apply over his knee. He continues to request increasing doses of narcotics.  -He is tolerating PO and will continue Norco 5/325 every 6 hours as needed.   4.  Tobacco abuse. -Patient counseled.  Code Status: Full code Family Communication: I spoke to his daughter and wife at the bedside Disposition Plan: PT recommending CIR, CIR consult placed awaiting  acceptance   Consultants:  Neurology   HPI/Subjective: [name] is a 64 year old gentleman with a past medical history of hypertension dyslipidemia and tobacco abuse who presented overnight with complaints of left-sided weakness that was associate with dysarthria. MRI of brain without contrast performed on admission revealed an 8 mm focus within right paramedian pons consistent with acute ischemic infarct. He was seen and evaluated by neurology. He was started on aspirin 325 mg by mouth daily and admitted for stroke workup.  Objective: Filed Vitals:   06/02/15 0909  BP: 177/85  Pulse: 79  Temp: 98.7 F (37.1 C)  Resp: 20    Intake/Output Summary (Last 24 hours) at 06/02/15 1615 Last data filed at 06/02/15 0318  Gross per 24 hour  Intake      0 ml  Output    500 ml  Net   -500 ml   Filed Weights   05/30/15 1806 05/31/15 0345  Weight: 92.987 kg (205 lb) 89.177 kg (196 lb 9.6 oz)    Exam:   General: Patient is awake and alert, chronically ill-appearing, thin   Cardiovascular: Regular rate and rhythm normal S1-S2 no murmurs rubs or gallops  Respiratory: Normal respiratory effort  Abdomen: Soft nontender nondistended  Musculoskeletal: No edema  Neurological: Patient having 3/5 muscle strength to his left lower extremity, 4/5 muscle strength to his left upper extremity, I did not note facial droop or slurred speech.  Data Reviewed: Basic Metabolic Panel:  Recent Labs Lab 05/30/15 1550 06/02/15 0432  NA 139 137  K 3.6 3.5  CL 107 103  CO2 25 26  GLUCOSE 82 116*  BUN 13 15  CREATININE 0.88 0.81  CALCIUM 9.1 9.3   Liver  Function Tests:  Recent Labs Lab 05/30/15 1550  AST 24  ALT 17  ALKPHOS 126  BILITOT 0.8  PROT 7.5  ALBUMIN 4.1   No results for input(s): LIPASE, AMYLASE in the last 168 hours. No results for input(s): AMMONIA in the last 168 hours. CBC:  Recent Labs Lab 05/30/15 1550 06/02/15 0432  WBC 5.1 4.7  NEUTROABS 2.7  --   HGB  12.5* 12.6*  HCT 40.4 40.3  MCV 82.3 81.3  PLT 279 256   Cardiac Enzymes: No results for input(s): CKTOTAL, CKMB, CKMBINDEX, TROPONINI in the last 168 hours. BNP (last 3 results) No results for input(s): BNP in the last 8760 hours.  ProBNP (last 3 results) No results for input(s): PROBNP in the last 8760 hours.  CBG:  Recent Labs Lab 05/30/15 1527  GLUCAP 90    No results found for this or any previous visit (from the past 240 hour(s)).   Studies: No results found.  Scheduled Meds: . aspirin  300 mg Rectal Daily   Or  . aspirin  325 mg Oral Daily  . hydrochlorothiazide  25 mg Oral Daily  . [START ON 06/03/2015] isosorbide mononitrate  60 mg Oral Daily  . lisinopril  20 mg Oral Daily  .  morphine injection  2 mg Intravenous Once  . nicotine  14 mg Transdermal Daily   Continuous Infusions:   Active Problems:   HTN (hypertension)   Acute CVA (cerebrovascular accident) ([place])   Stroke ([place])    Time spent: 25 min    Kelvin Cellar  [facility] Pager [phone]. If 7PM-7AM, please contact night-coverage at [email or web address], password [facility] 06/02/2015, 4:15 PM  LOS: 2 days

## 2015-06-02 NOTE — Progress Notes (Signed)
Physical Therapy Treatment Patient Details Name: [name] MRN: [id number] DOB: April 27, 1951 Today's Date: 06/02/2015    History of Present Illness [name] is an 63 y.o. male history of hypertension, hyperlipidemia and arthritis, who presented with new onset weakness involving left arm and left p.m. first noticed when he woke up on the morning of 05/28/2015. He was last known well at bedtime at 10:30 PM on the previous night. He has no previous history of stroke nor TIA. He has not been on antiplatelet therapy. MRI of his brain showed small right paramedian pontine ischemic stroke.    PT Comments    Pt progressing towards physical therapy goals. Pt motivated for distance, and appeared to push himself to make it to the end of the hall. 2 standing rest breaks required due to fatigue. Continue to feel this pt would benefit from continued therapy services at the CIR level to maximize functional independence prior to return home. Family member present during session and appeared very supportive. Will continue to follow.   Follow Up Recommendations  CIR     Equipment Recommendations  Rolling walker with 5" wheels    Recommendations for Other Services Rehab consult     Precautions / Restrictions Precautions Precautions: Fall Restrictions Weight Bearing Restrictions: No    Mobility  Bed Mobility Overal bed mobility: Needs Assistance Bed Mobility: Supine to Sit     Supine to sit: Min guard     General bed mobility comments: Pt was able to transition to EOB without assistance. Close guard for safety.   Transfers Overall transfer level: Needs assistance Equipment used: Rolling walker (2 wheeled) Transfers: Sit to/from Stand Sit to Stand: Min assist         General transfer comment: Assist for balance/support as pt powered-up to full standing position.   Ambulation/Gait Ambulation/Gait assistance: Min assist Ambulation Distance (Feet): 200 Feet Assistive device:  Rolling walker (2 wheeled) Gait Pattern/deviations: Step-through pattern;Decreased stride length;Narrow base of support;Trunk flexed Gait velocity: decreased Gait velocity interpretation: Below normal speed for age/gender General Gait Details: Pt continues to demonstrate L knee buckling, requiring heavy min assist to recover. Light min assist provided grossly for balance/support.    Stairs            Wheelchair Mobility    Modified Rankin (Stroke Patients Only) Modified Rankin (Stroke Patients Only) Pre-Morbid Rankin Score: No symptoms Modified Rankin: Moderately severe disability     Balance Overall balance assessment: Needs assistance Sitting-balance support: Feet supported;No upper extremity supported Sitting balance-Leahy Scale: Good     Standing balance support: Bilateral upper extremity supported;During functional activity Standing balance-Leahy Scale: Poor                      Cognition Arousal/Alertness: Awake/alert Behavior During Therapy: WFL for tasks assessed/performed Overall Cognitive Status: Impaired/Different from baseline Area of Impairment: Safety/judgement;Problem solving         Safety/Judgement: Decreased awareness of safety;Decreased awareness of deficits   Problem Solving: Requires verbal cues;Requires tactile cues      Exercises      General Comments        Pertinent Vitals/Pain Pain Assessment: Faces Faces Pain Scale: Hurts whole lot Pain Location: Headache Pain Intervention(s): Limited activity within patient's tolerance;Monitored during session;Repositioned    Home Living                      Prior Function  PT Goals (current goals can now be found in the care plan section) Acute Rehab PT Goals Patient Stated Goal: to get better PT Goal Formulation: With patient/family Time For Goal Achievement: 06/08/15 Potential to Achieve Goals: Good Progress towards PT goals: Progressing toward goals     Frequency  Min 4X/week    PT Plan Current plan remains appropriate    Co-evaluation             End of Session Equipment Utilized During Treatment: Gait belt Activity Tolerance: Patient tolerated treatment well Patient left: in chair;with call bell/phone within reach;with chair alarm set;with family/visitor present     Time: 1153-1220 PT Time Calculation (min) (ACUTE ONLY): 27 min  Charges:  $Gait Training: 23-37 mins                    G Codes:      Rolinda Roan 06-14-15, 1:51 PM  Rolinda Roan, PT, DPT Acute Rehabilitation Services Pager: [phone]

## 2015-06-03 ENCOUNTER — Inpatient Hospital Stay (HOSPITAL_COMMUNITY): Payer: Medicare Other

## 2015-06-03 DIAGNOSIS — I639 Cerebral infarction, unspecified: Secondary | ICD-10-CM

## 2015-06-03 LAB — CBC
HCT: 42.3 % (ref 39.0–52.0)
Hemoglobin: 13.3 g/dL (ref 13.0–17.0)
MCH: 25.6 pg — ABNORMAL LOW (ref 26.0–34.0)
MCHC: 31.4 g/dL (ref 30.0–36.0)
MCV: 81.5 fL (ref 78.0–100.0)
Platelets: 307 10*3/uL (ref 150–400)
RBC: 5.19 MIL/uL (ref 4.22–5.81)
RDW: 16.4 % — ABNORMAL HIGH (ref 11.5–15.5)
WBC: 4.9 10*3/uL (ref 4.0–10.5)

## 2015-06-03 LAB — COMPREHENSIVE METABOLIC PANEL
ALT: 24 U/L (ref 17–63)
AST: 43 U/L — ABNORMAL HIGH (ref 15–41)
Albumin: 4 g/dL (ref 3.5–5.0)
Alkaline Phosphatase: 136 U/L — ABNORMAL HIGH (ref 38–126)
Anion gap: 9 (ref 5–15)
BUN: 17 mg/dL (ref 6–20)
CO2: 24 mmol/L (ref 22–32)
Calcium: 9.4 mg/dL (ref 8.9–10.3)
Chloride: 104 mmol/L (ref 101–111)
Creatinine, Ser: 1.21 mg/dL (ref 0.61–1.24)
GFR calc Af Amer: >60 mL/min (ref >60)
GFR calc non Af Amer: >60 mL/min (ref >60)
Glucose, Bld: 119 mg/dL — ABNORMAL HIGH (ref 65–99)
Potassium: 3.8 mmol/L (ref 3.5–5.1)
Sodium: 137 mmol/L (ref 135–145)
Total Bilirubin: 0.5 mg/dL (ref 0.3–1.2)
Total Protein: 7.5 g/dL (ref 6.5–8.1)

## 2015-06-03 MED ORDER — LORAZEPAM 2 MG/ML IJ SOLN
0.5000 mg | Freq: Once | INTRAMUSCULAR | Status: AC
  Administered 2015-06-03: 0.5 mg via INTRAVENOUS
  Filled 2015-06-03: qty 1

## 2015-06-03 NOTE — Progress Notes (Signed)
STROKE TEAM PROGRESS NOTE    SUBJECTIVE (INTERVAL HISTORY) His fiance is at the bedside.  [name] reports walking with help with physical therapy and feeling off balance.    OBJECTIVE Temp:  [98 F (36.7 C)-98.7 F (37.1 C)] 98 F (36.7 C) (11/15 0601) Pulse Rate:  [74-94] 94 (11/15 1303) Cardiac Rhythm:  [-] Normal sinus rhythm (11/15 0700) Resp:  [20] 20 (11/15 0601) BP: (119-188)/(68-98) 151/92 mmHg (11/15 1303) SpO2:  [98 %-100 %] 98 % (11/15 1303)  CBC:   Recent Labs Lab 05/30/15 1550 06/02/15 0432  WBC 5.1 4.7  NEUTROABS 2.7  --   HGB 12.5* 12.6*  HCT 40.4 40.3  MCV 82.3 81.3  PLT 279 123456    Basic Metabolic Panel:   Recent Labs Lab 05/30/15 1550 06/02/15 0432  NA 139 137  K 3.6 3.5  CL 107 103  CO2 25 26  GLUCOSE 82 116*  BUN 13 15  CREATININE 0.88 0.81  CALCIUM 9.1 9.3   Lipid Panel:     Component Value Date/Time   CHOL 92 05/31/2015 0548   TRIG 87 05/31/2015 0548   HDL 35* 05/31/2015 0548   CHOLHDL 2.6 05/31/2015 0548   VLDL 17 05/31/2015 0548   LDLCALC 40 05/31/2015 0548   HgbA1c:  Lab Results  Component Value Date   HGBA1C 5.8* 05/31/2015   Urine Drug Screen:     Component Value Date/Time   LABOPIA NONE DETECTED 12/23/2010 1808   COCAINSCRNUR NONE DETECTED 12/23/2010 1808   LABBENZ NONE DETECTED 12/23/2010 1808   AMPHETMU NONE DETECTED 12/23/2010 1808   THCU POSITIVE* 12/23/2010 1808   LABBARB  12/23/2010 1808    NONE DETECTED        DRUG SCREEN FOR MEDICAL PURPOSES ONLY.  IF CONFIRMATION IS NEEDED FOR ANY PURPOSE, NOTIFY LAB WITHIN 5 DAYS.        LOWEST DETECTABLE LIMITS FOR URINE DRUG SCREEN Drug Class       Cutoff (ng/mL) Amphetamine      1000 Barbiturate      200 Benzodiazepine   [id number] Tricyclics       [id number] Opiates          300 Cocaine          300 THC              50     IMAGING  Ct Head Wo Contrast 05/30/2015   No acute intracranial abnormality.   Mr [name] Contrast 05/30/2015   1. 8 mm acute ischemic lacunar  type infarct within the the right paramedian pons. No associated hemorrhage or mass effect. This infarct is located immediately adjacent to several remote chronic lacunar infarcts within the pons.  2. Generalized cerebral atrophy with moderate chronic small vessel ischemic disease and remote lacunar infarcts as above.   Ct Abdomen Pelvis W Contrast 05/30/2015   No acute finding to explain pain. Moderate size hiatal hernia. Atherosclerosis of the aorta and its branch vessels. Previous repair of right inguinal hernia without visible complication.    PHYSICAL EXAM General - Well nourished, well developed, in NAD   HEENT:  NCAT, sclera clear Cardiovascular - Regular rate and rhythm;  Loud SEM Pulmonary: CTA Abdomen: NT, ND, normal bowel sounds Extremities: No C/C/E  Neurological Exam Mental Status: Normal Orientation:  Oriented to person, place and time Speech:  Fluent; no dysarthria Cranial Nerves:  PERRL; EOMI; visual fields full, face grossly symmetric, hearing grossly intact; shrug symmetric and tongue midline Motor Exam:  Tone:  Within normal limits; Strength: 5/5 throughout right side; mild left hemiparesis 4/5 with weakness of left grip, intrinsic hand muscles, left hip flexors and ankle dorsiflexors   The grip is weaker in the left UE Sensory: Intact to light touch throughout Coordination:  Intact finger to nose Gait: Deferred   ASSESSMENT/PLAN [name] is a 64 y.o. male with history of hyperlipidemia, hypertension, hepatitis C, and history of gout presenting with left upper extremity weakness.  [name] did not receive IV t-PA due to late presentation.   Stroke:  Non-dominant R paramedian pontine infarct secondary to small vessel disease.  Resultant  Left sided weakness  MRI - 8 mm acute ischemic lacunar type infarct within the the right paramedian pons. Remote chronic lacunar infarcts.  MRA - not performed  TCD : Low normal velocity of all identified vessels of  the anterior and posterior circulations, with no evidence of stenosis, vasospasm or occlusion. Globally elevated pulsatility indices suggests diffuse  intracranial atherosclerosis.  Carotid Doppler - Bilateral:1-39% ICA stenosis. Vertebral artery flow is antegrade.   2D Echo EF 60-65%. No cardiac source of emboli identified.    LDL - 40, at goal  HgbA1c 5.8  VTE prophylaxis - SCDs Diet Heart Room service appropriate?: Yes; Fluid consistency:: Thin  No antithrombotic prior to admission, now on aspirin 325 mg daily  Patient counseled to be compliant with his antithrombotic medications  Ongoing aggressive stroke risk factor management  Therapy recommendations: CIR  Disposition: Rehab  followup Dr. Leonie Man in 2 months  Hypertension  Elevated blood pressures  Permissive hypertension (OK if < 220/120) but gradually normalize in 24-48 hours  Now on hydrochlorothiazide and lisinopril.     Other Stroke Risk Factors  Advanced age  Cigarette smoker, advised to stop smoking. Re-encouraged. Smokes 1/2 ppd. Wants to quit. Agreeable to nicotine patch  UDS - positive for THC  ETOH use - multiple beers per week  Hx stroke/TIA  Other Active Problems   Generalized anxiety  Hospital day # [name] See Amion for Pager information 06/03/2015 1:50 PM  I have personally examined this patient, reviewed notes, independently viewed imaging studies, participated in medical decision making and plan of care. I have made any additions or clarifications directly to the above note. Agree with note above. [name] presented with dizziness, vertigo and left-sided weakness secondary to right small paramedian pontine lacunar infarct etiology likely small vessel disease.   I had a long discussion with the patient and his fiance was regards to his is risk for recurrent strokes, TIAs and needs for aggressive risk factor modification. I counseled him to quit smoking  completely and be compliant with his medications and follow-up. Recommend aspirin and [name] would likely need inpatient rehabilitation. Stroke team will sign off. Follow-up as an outpatient in stroke clinic.  Jonathon Contras, MD Medical Director [facility] Pager: [phone] 06/03/2015 1:50 PM  To contact Stroke Continuity provider, please refer to [email or web address] After hours, contact General Neurology

## 2015-06-03 NOTE — Progress Notes (Signed)
Physical Therapy Treatment Patient Details Name: [name] MRN: [id number] DOB: May 09, 1951 Today's Date: 06/03/2015    History of Present Illness [name] is an 64 y.o. male history of hypertension, hyperlipidemia and arthritis, who presented with new onset weakness involving left arm and left p.m. first noticed when he woke up on the morning of 05/28/2015. He was last known well at bedtime at 10:30 PM on the previous night. He has no previous history of stroke nor TIA. He has not been on antiplatelet therapy. MRI of his brain showed small right paramedian pontine ischemic stroke.    PT Comments    Pt limited this session by cramping sensation in ribs underneath R armpit. Pt reports this happens to him frequently at home and he puts a hot pack on it. Pain was not exacerbated by palpation, however increased with coughing. Pt was positioned in the recliner with pillows behind him to support back, and instant heat packs were applied. Pt reports he is not able to tolerate any more activity after walk to chair due to pain. RN aware. Will continue to follow and progress as able per POC.   Follow Up Recommendations  CIR     Equipment Recommendations  Rolling walker with 5" wheels    Recommendations for Other Services Rehab consult     Precautions / Restrictions Precautions Precautions: Fall Restrictions Weight Bearing Restrictions: No    Mobility  Bed Mobility               General bed mobility comments: Pt sitting EOB with trunk flexed when PT arrived.   Transfers Overall transfer level: Needs assistance Equipment used: 1 person hand held assist Transfers: Sit to/from Stand Sit to Stand: Min assist         General transfer comment: Steadying assist as pt powered-up to full standing.   Ambulation/Gait Ambulation/Gait assistance: Min assist Ambulation Distance (Feet): 15 Feet Assistive device: 1 person hand held assist Gait Pattern/deviations: Step-through  pattern;Decreased stride length;Trunk flexed Gait velocity: decreased Gait velocity interpretation: Below normal speed for age/gender General Gait Details: Pt ambulated around the bed to the chair. Was not able to tolerate increased distance due to cramping in his R side.    Stairs            Wheelchair Mobility    Modified Rankin (Stroke Patients Only) Modified Rankin (Stroke Patients Only) Pre-Morbid Rankin Score: No symptoms Modified Rankin: Moderately severe disability     Balance Overall balance assessment: Needs assistance Sitting-balance support: Feet supported;No upper extremity supported Sitting balance-Leahy Scale: Fair     Standing balance support: Single extremity supported;During functional activity Standing balance-Leahy Scale: Poor Standing balance comment: Min assist for balance and support                    Cognition Arousal/Alertness: Awake/alert Behavior During Therapy: WFL for tasks assessed/performed Overall Cognitive Status: History of cognitive impairments - at baseline                      Exercises      General Comments        Pertinent Vitals/Pain Pain Assessment: Faces Faces Pain Scale: Hurts whole lot Pain Location: Ribs under arm pit.  Pain Descriptors / Indicators: Cramping Pain Intervention(s): Limited activity within patient's tolerance;Monitored during session;Repositioned;Heat applied    Home Living  Prior Function            PT Goals (current goals can now be found in the care plan section) Acute Rehab PT Goals Patient Stated Goal: to get better PT Goal Formulation: With patient/family Time For Goal Achievement: 06/08/15 Potential to Achieve Goals: Good Progress towards PT goals: Progressing toward goals    Frequency  Min 4X/week    PT Plan Current plan remains appropriate    Co-evaluation             End of Session Equipment Utilized During Treatment: Gait  belt Activity Tolerance: Patient tolerated treatment well Patient left: in chair;with call bell/phone within reach;with chair alarm set;with family/visitor present     Time: 1350-1402 PT Time Calculation (min) (ACUTE ONLY): 12 min  Charges:  $Therapeutic Activity: 8-22 mins                    G Codes:      Rolinda Roan Jul 01, 2015, 2:27 PM   Rolinda Roan, PT, DPT Acute Rehabilitation Services Pager: [phone]

## 2015-06-03 NOTE — Care Management Important Message (Signed)
Important Message  Patient Details  Name: DHYEY MCGEE MRN: [id number] Date of Birth: 1951-06-05   Medicare Important Message Given:  Yes    Shakina Choy P Payzlee Ryder 06/03/2015, 12:05 PM

## 2015-06-03 NOTE — Evaluation (Signed)
Speech Language Pathology Evaluation Patient Details Name: [name] MRN: [id number] DOB: 06-07-1951 Today's Date: 06/03/2015 Time: 1410-1437 SLP Time Calculation (min) (ACUTE ONLY): 27 min  Problem List:  Patient Active Problem List   Diagnosis Date Noted  . Acute ischemic stroke ([place])   . Acute CVA (cerebrovascular accident) ([place]) 05/31/2015  . Stroke ([place]) 05/31/2015  . Dyspnea 04/10/2014  . HTN (hypertension) 04/10/2014  . Inguinal hernia without mention of obstruction or gangrene, unilateral or unspecified, (not specified as recurrent)-right 01/30/2014   Past Medical History:  Past Medical History  Diagnosis Date  . Hyperlipidemia     not on any meds  . Hypertension     takes Amlodipine and Lisinopril daily  . Insomnia     takes Ambien nightly  . History of blood transfusion     no abnormal reaction noted  . PONV (postoperative nausea and vomiting)   . Shortness of breath dyspnea     rarely but when notices he can be lying/sitting/exertion.Dr.Hochrein is aware per pt  . Headache     occasionally  . Numbness     both legs occasionally  . Arthritis   . Joint pain   . Joint swelling   . Chronic back pain   . History of gout     doesn't take any meds  . GERD (gastroesophageal reflux disease)     uses Baking Soda  . Hepatitis C   . Urinary frequency   . Urinary urgency   . Nocturia   . Glaucoma     right eye  . Cancer [facility])     Prostate CA  . GIB (gastrointestinal bleeding) 2012  . Ischemic colitis ([place]) 2012   Past Surgical History:  Past Surgical History  Procedure Laterality Date  . Appendectomy    . Small intestine surgery    . Multiple abdominal surgeries      total of 13  . Prostatectomy    . Colonoscopy    . Esophagogastroduodenoscopy    . Inguinal hernia repair Right 11/04/2014    Procedure: RIGHT INGUINAL HERNIA REPAIR WITH MESH;  Surgeon: Jackolyn Confer, MD;  Location: [place];  Service: General;  Laterality: Right;  . Mass excision N/A  11/04/2014    Procedure: REMOVAL OF RIGHT GROIN SOFT TISSUE MASS;  Surgeon: Jackolyn Confer, MD;  Location: [place];  Service: General;  Laterality: N/A;   HPI:  [name] is an 64 y.o. male history of hypertension, hyperlipidemia and arthritis, who presented with new onset weakness involving left arm and left p.m. first noticed when he woke up on the morning of 05/28/2015. He was last known well at bedtime at 10:30 PM on the previous night. He has no previous history of stroke nor TIA. He has not been on antiplatelet therapy. MRI of his brain showed small right paramedian pontine ischemic stroke.   Assessment / Plan / Recommendation Clinical Impression  Pt has moderate impairments with retrieval of new information and mildly complex verbal problem solving. While he shows good intellectual awareness of his deficits, he has poor emergent and anticipatory awareness, which reduced his overall safety. Speech is moderately impacted by reduced volume. Recommend additional SLP f/u and CIR level therapy to maximize recovery.    SLP Assessment  Patient needs continued Speech Lanaguage Pathology Services    Follow Up Recommendations  Inpatient Rehab;24 hour supervision/assistance    Frequency and Duration min 2x/week  2 weeks      SLP Evaluation Prior Functioning  Cognitive/Linguistic  Baseline: Within functional limits Type of Home: Other(Comment) (townhouse)  Lives With: Significant other Available Help at Discharge: Family;Available 24 hours/day Vocation: Retired   Associate Professor  Overall Cognitive Status: Impaired/Different from baseline Arousal/Alertness: Awake/alert Orientation Level: Oriented X4 Attention: Sustained Sustained Attention: Appears intact Memory: Impaired Memory Impairment: Retrieval deficit;Decreased recall of new information Awareness: Impaired Awareness Impairment: Emergent impairment;Anticipatory impairment Problem Solving: Impaired Problem Solving Impairment:  Verbal complex Behaviors: Impulsive Safety/Judgment: Impaired    Comprehension  Auditory Comprehension Overall Auditory Comprehension: Appears within functional limits for tasks assessed    Expression Expression Primary Mode of Expression: Verbal Verbal Expression Overall Verbal Expression: Appears within functional limits for tasks assessed   Oral / Motor Oral Motor/Sensory Function Overall Oral Motor/Sensory Function: Within functional limits Motor Speech Overall Motor Speech: Impaired Respiration: Impaired Level of Impairment: Conversation Phonation: Low vocal intensity Resonance: Within functional limits Articulation: Within functional limitis Intelligibility: Intelligibility reduced Conversation: 50-74% accurate Motor Planning: Witnin functional limits Motor Speech Errors: Not applicable    Germain Osgood, M.A. CCC-SLP [phone]  Germain Osgood 06/03/2015, 2:44 PM

## 2015-06-03 NOTE — Progress Notes (Signed)
[facility] PROGRESS NOTE  [name] [id number] DOB: [birth year] DOA: 05/30/2015 PCP: Junie Panning, NP   Assessment/Plan: 1. Acute CVA. -Patient presenting with complaints of left-sided weakness that was associated with dysarthria. -Further workup included an MRI of brain that revealed an 8 mm focus within the right paramedian pons -Transthoracic echocardiogram revealed an ejection fraction of 60-65% -Carotid Dopplers revealed 1-39% ICA stenosis -MRA is pending at the time of this dictation  -Fasting lipid panel showed LDL of 40. -Continue ASA 325 mg PO q daily.  -Case discussed with Physical therapy, recommending CIR. Inpatient rehabilitation consult placed -Awaiting approval for CIR  2.  Generalized anxiety -Continue Ativan 0.5 mg by mouth 3 times a day as needed  3.  Hypertension. -Patient presenting with acute CVA, initially allowing elevated blood pressures to favor cerebral perfusion, now gradually working on lowering his blood pressures -Increased Imdur from 30 to 60 mg PO q daily today. Meanwhile will continue lisinopril 20 mg by mouth daily and hydrochlorothiazide 25 mg by mouth daily. Will continue monitoring blood pressures.   4.  Generalized pain. -Patient reporting pain involving shoulders, lower abdominal region, knees, with his complaints outweighed physical exam findings. This has been the his focus and major concern.  -I explained that CT of ABD and pelvis did not show acute intra-abdominal pathology. Joint pain could be related to OA. He was given voltaren cream to apply over his knee. He continues to request increasing doses of narcotics.  -He is tolerating PO and will continue Norco 5/325 every 6 hours as needed.   4.  Tobacco abuse. -Patient counseled.  Code Status: Full code Family Communication: I spoke to his daughter and wife at the bedside Disposition Plan: Awaiting approval for CIR, anticipate discharge in the next 24  hours   Consultants:  Neurology   HPI/Subjective: Mr [name] is a 64 year old gentleman with a past medical history of hypertension dyslipidemia and tobacco abuse who presented overnight with complaints of left-sided weakness that was associate with dysarthria. MRI of brain without contrast performed on admission revealed an 8 mm focus within right paramedian pons consistent with acute ischemic infarct. He was seen and evaluated by neurology. He was started on aspirin 325 mg by mouth daily and admitted for stroke workup.  Objective: Filed Vitals:   06/03/15 1454  BP: 156/89  Pulse: 94  Temp:   Resp: 20   No intake or output data in the 24 hours ending 06/03/15 1644 Filed Weights   05/30/15 1806 05/31/15 0345  Weight: 92.987 kg (205 lb) 89.177 kg (196 lb 9.6 oz)    Exam:   General: Patient is awake and alert, chronically ill-appearing, thin, reports feeling dizzy  Cardiovascular: Regular rate and rhythm normal S1-S2 no murmurs rubs or gallops  Respiratory: Normal respiratory effort  Abdomen: Soft nontender nondistended  Musculoskeletal: No edema  Neurological: Patient having 3/5 muscle strength to his left lower extremity, 4/5 muscle strength to his left upper extremity, I did not note facial droop or slurred speech.  Data Reviewed: Basic Metabolic Panel:  Recent Labs Lab 05/30/15 1550 06/02/15 0432 06/03/15 1416  NA 139 137 137  K 3.6 3.5 3.8  CL 107 103 104  CO2 25 26 24   GLUCOSE 82 116* 119*  BUN 13 15 17   CREATININE 0.88 0.81 1.21  CALCIUM 9.1 9.3 9.4   Liver Function Tests:  Recent Labs Lab 05/30/15 1550 06/03/15 1416  AST 24 43*  ALT 17 24  ALKPHOS 126 136*  BILITOT 0.8 0.5  PROT 7.5 7.5  ALBUMIN 4.1 4.0   No results for input(s): LIPASE, AMYLASE in the last 168 hours. No results for input(s): AMMONIA in the last 168 hours. CBC:  Recent Labs Lab 05/30/15 1550 06/02/15 0432 06/03/15 1416  WBC 5.1 4.7 4.9  NEUTROABS 2.7  --   --    HGB 12.5* 12.6* 13.3  HCT 40.4 40.3 42.3  MCV 82.3 81.3 81.5  PLT 279 256 307   Cardiac Enzymes: No results for input(s): CKTOTAL, CKMB, CKMBINDEX, TROPONINI in the last 168 hours. BNP (last 3 results) No results for input(s): BNP in the last 8760 hours.  ProBNP (last 3 results) No results for input(s): PROBNP in the last 8760 hours.  CBG:  Recent Labs Lab 05/30/15 1527  GLUCAP 90    No results found for this or any previous visit (from the past 240 hour(s)).   Studies: No results found.  Scheduled Meds: . aspirin  300 mg Rectal Daily   Or  . aspirin  325 mg Oral Daily  . hydrochlorothiazide  25 mg Oral Daily  . isosorbide mononitrate  60 mg Oral Daily  . lisinopril  20 mg Oral Daily  .  morphine injection  2 mg Intravenous Once  . nicotine  14 mg Transdermal Daily   Continuous Infusions:   Active Problems:   HTN (hypertension)   Acute CVA (cerebrovascular accident) ([place])   Stroke ([place])   Acute ischemic stroke ([place])    Time spent: 15 min    Kelvin Cellar  [facility] Pager [phone]. If 7PM-7AM, please contact night-coverage at [email or web address], password [facility] 06/03/2015, 4:44 PM  LOS: 3 days

## 2015-06-03 NOTE — Progress Notes (Signed)
Rehab admissions - I have received a denial from [facility] for acute inpatient rehab admission.  I am checking with rehab MD to see about doing a peer to peer review.  Call me for questions.  [id number]:[id number]

## 2015-06-03 NOTE — Progress Notes (Signed)
  Vascular Ultrasound Transcranial Doppler has been completed.    Landry Mellow, RDMS, RVT  06/03/2015, 9:25 AM

## 2015-06-04 DIAGNOSIS — Z720 Tobacco use: Secondary | ICD-10-CM | POA: Insufficient documentation

## 2015-06-04 DIAGNOSIS — I639 Cerebral infarction, unspecified: Principal | ICD-10-CM

## 2015-06-04 DIAGNOSIS — I10 Essential (primary) hypertension: Secondary | ICD-10-CM

## 2015-06-04 MED ORDER — ISOSORBIDE MONONITRATE ER 60 MG PO TB24: 60.0000 mg | ORAL_TABLET | Freq: Every day | ORAL | Status: DC

## 2015-06-04 MED ORDER — HYDROCHLOROTHIAZIDE 25 MG PO TABS: 25.0000 mg | ORAL_TABLET | Freq: Every day | ORAL | Status: DC

## 2015-06-04 MED ORDER — NICOTINE 14 MG/24HR TD PT24: 14.0000 mg | MEDICATED_PATCH | Freq: Every day | TRANSDERMAL | Status: DC

## 2015-06-04 MED ORDER — LORAZEPAM 0.5 MG PO TABS: 0.5000 mg | ORAL_TABLET | Freq: Three times a day (TID) | ORAL | Status: DC | PRN

## 2015-06-04 MED ORDER — DICLOFENAC SODIUM 1 % TD GEL: 2.0000 g | Freq: Three times a day (TID) | TRANSDERMAL | Status: DC | PRN

## 2015-06-04 MED ORDER — ASPIRIN 325 MG PO TABS: 325.0000 mg | ORAL_TABLET | Freq: Every day | ORAL | Status: DC

## 2015-06-04 MED ORDER — HYDROCODONE-ACETAMINOPHEN 5-325 MG PO TABS
1.0000 | ORAL_TABLET | Freq: Four times a day (QID) | ORAL | Status: DC | PRN
Start: 2015-06-04 — End: 2017-06-23

## 2015-06-04 MED ORDER — SENNOSIDES-DOCUSATE SODIUM 8.6-50 MG PO TABS: 1.0000 | ORAL_TABLET | Freq: Every evening | ORAL | Status: DC | PRN

## 2015-06-04 NOTE — Progress Notes (Signed)
PTAR here to pick patient, but patient refused and said daughter was going home with daughter.

## 2015-06-04 NOTE — Progress Notes (Signed)
Patient is being d/c home. Dc instructions given and patient and caregiver verbalized understanding. Patient awaiting transportation.

## 2015-06-04 NOTE — Clinical Social Work Note (Signed)
Clinical Social Work Assessment  Patient Details  Name: [name] MRN: [id number] Date of Birth: Jun 29, 1951  Date of referral:  06/04/15               Reason for consult:  Facility Placement, Discharge Planning                Permission sought to share information with:  Family Supports, Case Freight forwarder, Chartered certified accountant granted to share information::  Yes, Verbal Permission Granted  Name::      [name])  Agency::   (SNF's )  Relationship::   (Significant Other )  Contact Information:   [phone])  Housing/Transportation Living arrangements for the past 2 months:  [place] of Information:  Patient Patient Interpreter Needed:  None Criminal Activity/Legal Involvement Pertinent to Current Situation/Hospitalization:  No - Comment as needed Significant Relationships:  Adult Children, Significant Other Lives with:  Significant Other Do you feel safe going back to the place where you live?  Yes Need for family participation in patient care:  Yes (Comment)  Care giving concerns:  CIR has received denial to admit patient. CIR currently appealing denial.    Facilities manager / plan: CSW met with patient and significant other, [name] in reference to SNF placement. CSW introduced CSW role and SNF process. CSW also reviewed and provided SNF list. Pt stated he is NOT interested in SNF however concerned with climbing stairs at home. Patient reported that there are 3 stairs to get into his first door and 14 stairs to get to the second floor. Pt requesting for CSW to assist with pt moving into a different apartment. CSW advised pt and his girlfriend to contact family members to assist with pt moving into a "flat" apartment. CSW also advised pt to contact leasing office to possibly be transferred into a "flat" apartment. Pt and girlfriend expressed understanding. No further concerns reported at this time. CSW remains available as needed.    Employment status:  Retired Nurse, adult PT Recommendations:  Inpatient [place] / Referral to community resources:  [place]  Patient/Family's Response to care:  Pt a/o x4. Pt is not fully agreeable to SNF however agreeable to being starting SNF search. Pt and girlfriend pleasant and appreciated social work intervention. Pt's girlfriend supportive and strongly involved in pt's care.   Patient/Family's Understanding of and Emotional Response to Diagnosis, Current Treatment, and Prognosis:  Pt understands that he will benefit the most from SNF placement.   Emotional Assessment Appearance:  Appears stated age Attitude/Demeanor/Rapport:   (Pleasant ) Affect (typically observed):  Apprehensive, Pleasant Orientation:  Oriented to Situation, Oriented to  Time, Oriented to Place, Oriented to Self Alcohol / Substance use:  Not Applicable Psych involvement (Current and /or in the community):  No (Comment)  Discharge Needs  Concerns to be addressed:  Care Coordination Readmission within the last 30 days:  No Current discharge risk:  Dependent with Mobility Barriers to Discharge:  Barriers Resolved   [name], MSW, LCSWA [phone] 06/04/2015 5:13 PM

## 2015-06-04 NOTE — Discharge Summary (Signed)
Physician Discharge Summary  MARNELL [name] [id number] DOB: [birth year] DOA: 05/30/2015  PCP: Junie Panning, NP  Admit date: 05/30/2015 Discharge date: 06/04/2015  Time spent: 35 minutes  Recommendations for Outpatient Follow-up:  1. Further BP monitoring as outpatient 2. Refused SNF 3. Insurance declined CIR 4. Home health   Discharge Diagnoses:  Active Problems:   HTN (hypertension)   Acute CVA (cerebrovascular accident) ([place])   Stroke ([place])   Acute ischemic stroke [facility])   Discharge Condition: improved  Diet recommendation: cardiac  Filed Weights   05/30/15 1806 05/31/15 0345  Weight: 92.987 kg (205 lb) 89.177 kg (196 lb 9.6 oz)    History of present illness:  [name] is a 64 y.o. gentleman with a history of HTN, dyslipidemia, active tobacco and EtOH, and medical noncompliance who reports left-sided weakness for the past two days. His wife noticed that he was dragging his left leg and foot on Wednesday. She noticed slurred speech by Wednesday night and facial droop by Thursday morning. She finally convinced the patient to present to the ED today for further evaluation. Unfortunately, ED evaluation has confirmed an acute right sided lacunar type infarct involving the pons (of note, chronic remote infarcts are present as well). He also has markedly elevated blood pressures, which are not new. Hospitalist asked to admit to [facility]; neurology consult pending.  He admits that he does not have a PCP right now. He says that he is still taking BP medications using refills from his previous doctor. He says that he has intermittently checked his BP at [place], and systolic BP is routinely greater than 200.  He is complaining of knee pain and difficulty sleeping.  Hospital Course:  1. Acute CVA. -Patient presenting with complaints of left-sided weakness that was associated with dysarthria. -Further workup included an MRI of brain that revealed an 8 mm  focus within the right paramedian pons -Transthoracic echocardiogram revealed an ejection fraction of 60-65% -Carotid Dopplers revealed 1-39% ICA stenosis -MRA is pending at the time of this dictation  -Fasting lipid panel showed LDL of 40. -Continue ASA 325 mg PO q daily.    2. Generalized anxiety -Continue Ativan 0.5 mg by mouth 3 times a day as needed  3. Hypertension. -Patient presenting with acute CVA, initially allowing elevated blood pressures to favor cerebral perfusion, now gradually working on lowering his blood pressures -Increased Imdur from 30 to 60 mg PO q daily . Meanwhile will continue lisinopril 20 mg by mouth daily and hydrochlorothiazide 25 mg by mouth daily. Will continue monitoring blood pressures.   4. Generalized pain.  Joint pain could be related to OA. He was given voltaren cream to apply over his knee. He continues to request increasing doses of narcotics.  -He is tolerating PO and will continue Norco 5/325 every 6 hours as needed.   4. Tobacco abuse. -Patient counseled.  Procedures:    Consultations:  neuro  Discharge Exam: Filed Vitals:   06/04/15 0926  BP: 168/81  Pulse: 67  Temp: 98 F (36.7 C)  Resp: 20    General: awake, NAD Refusing SNF despite recommendations   Discharge Instructions   Discharge Instructions    Ambulatory referral to Neurology    Complete by:  As directed   Please schedule post stroke follow up in 2 months.     Ambulatory referral to Neurology    Complete by:  As directed   Stroke office f/u in 1 month     Diet -  low sodium heart healthy    Complete by:  As directed      Increase activity slowly    Complete by:  As directed           Current Discharge Medication List    START taking these medications   Details  aspirin 325 MG tablet Take 1 tablet (325 mg total) by mouth daily.    diclofenac sodium (VOLTAREN) 1 % GEL Apply 2 g topically 3 (three) times daily as needed (Severe knee pain). Qty:  100 g, Refills: 0    hydrochlorothiazide (HYDRODIURIL) 25 MG tablet Take 1 tablet (25 mg total) by mouth daily.    HYDROcodone-acetaminophen (NORCO/VICODIN) 5-325 MG tablet Take 1 tablet by mouth every 6 (six) hours as needed for severe pain. Qty: 15 tablet, Refills: 0    isosorbide mononitrate (IMDUR) 60 MG 24 hr tablet Take 1 tablet (60 mg total) by mouth daily.    LORazepam (ATIVAN) 0.5 MG tablet Take 1 tablet (0.5 mg total) by mouth every 8 (eight) hours as needed for anxiety. Qty: 10 tablet, Refills: 0    nicotine (NICODERM CQ - DOSED IN MG/24 HOURS) 14 mg/24hr patch Place 1 patch (14 mg total) onto the skin daily. Qty: 28 patch, Refills: 0    senna-docusate (SENOKOT-S) 8.6-50 MG tablet Take 1 tablet by mouth at bedtime as needed for moderate constipation.      CONTINUE these medications which have NOT CHANGED   Details  lisinopril (PRINIVIL,ZESTRIL) 20 MG tablet Take 20 mg by mouth daily.       STOP taking these medications     Doxylamine Succinate, Sleep, (SLEEP AID PO)        Allergies  Allergen Reactions  . Penicillins Anaphylaxis  . Sudafed [Pseudoephedrine] Other (See Comments)    Heart "races"   Follow-up Information    Follow up with SETHI,PRAMOD, MD In 2 months.   Specialties:  Neurology, Radiology   Why:  Stroke Clinic, Office will call you with appointment date & time   Contact information:   [place] [address] [phone]       Follow up with Smothers, Andree Elk, NP In 1 week.   Specialty:  Nurse Practitioner   Contact information:   [address] [place] [address] [phone]        The results of significant diagnostics from this hospitalization (including imaging, microbiology, ancillary and laboratory) are listed below for reference.    Significant Diagnostic Studies: Ct Head Wo Contrast  05/30/2015  CLINICAL DATA:  Left arm and leg weakness for 1 day. EXAM: CT HEAD WITHOUT CONTRAST  TECHNIQUE: Contiguous axial images were obtained from the base of the skull through the vertex without intravenous contrast. COMPARISON:  04/03/2014 FINDINGS: No acute intracranial abnormality. Specifically, no hemorrhage, hydrocephalus, mass lesion, acute infarction, or significant intracranial injury. No acute calvarial abnormality. Visualized paranasal sinuses and mastoids clear. Orbital soft tissues unremarkable. IMPRESSION: No acute intracranial abnormality. Electronically Signed   By: Rolm Baptise M.D.   On: 05/30/2015 16:09   Mr Jodene Nam Head Wo Contrast  06/03/2015  CLINICAL DATA:  Left arm and leg weakness, slurred speech, facial droop. Acute right pontine infarct on recent MRI. EXAM: MRA HEAD WITHOUT CONTRAST TECHNIQUE: Angiographic images of the Circle of Willis were obtained using MRA technique without intravenous contrast. COMPARISON:  Brain MRI 05/30/2015 FINDINGS: The visualized distal vertebral arteries are patent and codominant with mild narrowing of the distal left vertebral artery.  AICA and SCA origins are patent. Basilar artery is patent without stenosis. Posterior communicating arteries are not identified. PCAs are patent without evidence of significant proximal stenosis. Internal carotid arteries are patent from skullbase to carotid termini without stenosis. ACAs and MCAs are patent with at most mild branch vessel irregularity but no significant proximal stenosis. No intracranial aneurysm is identified. IMPRESSION: 1. No evidence of medium or large vessel intracranial arterial occlusion. 2. Mild distal left vertebral artery stenosis. 3. No significant proximal anterior circulation stenosis. Electronically Signed   By: Logan Bores M.D.   On: 06/03/2015 19:46   Mr [name] Contrast  05/30/2015  CLINICAL DATA:  Initial evaluation for acute left arm and leg weakness for 1 day. EXAM: MRI HEAD WITHOUT CONTRAST TECHNIQUE: Multiplanar, multiecho pulse sequences of the brain and surrounding  structures were obtained without intravenous contrast. COMPARISON:  Prior CT from earlier the same day. FINDINGS: Study is moderately degraded by motion artifact. There is an 8 mm focus of high signal intensity on DWI sequence and the right paramedian pons, consistent with acute ischemic infarct. There is subtle associated signal loss on ADC map. This is located immediately adjacent to several other remote lacunar infarcts within the pons. No other acute infarct identified. Normal intravascular flow voids are maintained. No acute or chronic intracranial hemorrhage. Age-related cerebral atrophy present. Patchy T2/FLAIR hyperintensity within the periventricular white matter most consistent with chronic small vessel ischemic disease, mild to moderate in nature. Additional remote lacunar infarcts within the left thalamus, left basal ganglia, and right corona radiata. No mass lesion, midline shift, or mass effect. No hydrocephalus. No extra-axial fluid collection. Craniocervical junction within normal limits. Pituitary gland normal. No acute abnormality about the partially visualized orbits and globes. Scattered mucosal thickening within the ethmoidal air cells. No mastoid effusion. Inner ear structures grossly normal. Bone marrow signal intensity within normal limits. Scalp soft tissues demonstrate no acute abnormality. IMPRESSION: 1. 8 mm acute ischemic lacunar type infarct within the the right paramedian pons. No associated hemorrhage or mass effect. This infarct is located immediately adjacent to several remote chronic lacunar infarcts within the pons. 2. Generalized cerebral atrophy with moderate chronic small vessel ischemic disease and remote lacunar infarcts as above. Electronically Signed   By: Jeannine Boga M.D.   On: 05/30/2015 22:55   Ct Abdomen Pelvis W Contrast  05/30/2015  CLINICAL DATA:  Left arm and left leg weakness of 1 day duration. Abdominal pain of 3 days duration. EXAM: CT ABDOMEN AND  PELVIS WITH CONTRAST TECHNIQUE: Multidetector CT imaging of the abdomen and pelvis was performed using the standard protocol following bolus administration of intravenous contrast. CONTRAST:  148mL OMNIPAQUE IOHEXOL 300 MG/ML  SOLN COMPARISON:  09/06/2014 FINDINGS: Lung bases are clear.  There is a moderate size hiatal hernia. The liver has a normal appearance without focal lesions or biliary ductal dilatation. No calcified gallstones. The spleen is normal. The pancreas is normal. The adrenal glands are normal. The right kidney is normal. The left kidney is normal except for an exophytic entity measuring 1 cm in size which has not changed since 2013 and has only enlarged minimally since 2007. This likely represents a cyst. Few other tiny cysts in the left kidney. There is atherosclerosis of the aorta but no aneurysm. There is extensive atherosclerosis of the iliac vessels with ectasia. The IVC is normal. No retroperitoneal mass or adenopathy. No free intraperitoneal fluid or air. Previously seen inguinal hernia on the right has apparently been repaired. No  complications seen relative to that. There is no evidence of bowel obstruction or other acute bowel pathology. There is chronic scarring of the anterior abdominal wall related to previous surgery. IMPRESSION: No acute finding to explain pain. Moderate size hiatal hernia. Atherosclerosis of the aorta and its branch vessels. Previous repair of right inguinal hernia without visible complication. Electronically Signed   By: Nelson Chimes M.D.   On: 05/30/2015 19:38    Microbiology: No results found for this or any previous visit (from the past 240 hour(s)).   Labs: Basic Metabolic Panel:  Recent Labs Lab 05/30/15 1550 06/02/15 0432 06/03/15 1416  NA 139 137 137  K 3.6 3.5 3.8  CL 107 103 104  CO2 25 26 24   GLUCOSE 82 116* 119*  BUN 13 15 17   CREATININE 0.88 0.81 1.21  CALCIUM 9.1 9.3 9.4   Liver Function Tests:  Recent Labs Lab 05/30/15 1550  06/03/15 1416  AST 24 43*  ALT 17 24  ALKPHOS 126 136*  BILITOT 0.8 0.5  PROT 7.5 7.5  ALBUMIN 4.1 4.0   No results for input(s): LIPASE, AMYLASE in the last 168 hours. No results for input(s): AMMONIA in the last 168 hours. CBC:  Recent Labs Lab 05/30/15 1550 06/02/15 0432 06/03/15 1416  WBC 5.1 4.7 4.9  NEUTROABS 2.7  --   --   HGB 12.5* 12.6* 13.3  HCT 40.4 40.3 42.3  MCV 82.3 81.3 81.5  PLT 279 256 307   Cardiac Enzymes: No results for input(s): CKTOTAL, CKMB, CKMBINDEX, TROPONINI in the last 168 hours. BNP: BNP (last 3 results) No results for input(s): BNP in the last 8760 hours.  ProBNP (last 3 results) No results for input(s): PROBNP in the last 8760 hours.  CBG:  Recent Labs Lab 05/30/15 1527  GLUCAP 90       Signed:  Chamya Hunton  [facility] 06/04/2015, 12:54 PM

## 2015-06-04 NOTE — Clinical Social Work Note (Signed)
Clinical Social Worker has assessed patient at bedside. Full psychosocial assessment to follow.   Glendon Axe, MSW, LCSWA [phone] 06/04/2015 1:34 PM

## 2015-06-04 NOTE — Clinical Social Work Note (Signed)
Clinical Social Worker met with patient and significant other, [name] at bedside. RNCM also present and informed patient that he received another denial from [facility]. Patient now declining SNF placement and reported he will return home with the assistance of his significant other who is experienced in health care.   RNCM has arranged home health. MD notified.   Clinical Social Worker will sign off for now as social work intervention is no longer needed. Please consult [place] again if new need arises.  Glendon Axe, MSW, LCSWA [phone] 06/04/2015 5:17 PM

## 2015-06-04 NOTE — NC FL2 (Signed)
[place] LEVEL OF CARE SCREENING TOOL     IDENTIFICATION  Patient Name: [name] Birthdate: 05-04-1951 Sex: male Admission Date (Current Location): 05/30/2015  [facility] and [place] Number: Lane and Address:  [facility], [place] [address] [place],  [address]      Provider Number: [id number]  Attending Physician Name and Address:  Geradine Girt, DO  Relative Name and Phone Number:  [name] [phone]    Current Level of Care: Hospital Recommended Level of Care: [place] Prior Approval Number:    Date Approved/Denied:   PASRR Number:    Discharge Plan: SNF    Current Diagnoses: Patient Active Problem List   Diagnosis Date Noted  . Tobacco abuse   . Acute ischemic stroke ([place])   . Acute CVA (cerebrovascular accident) ([place]) 05/31/2015  . Stroke ([place]) 05/31/2015  . Dyspnea 04/10/2014  . HTN (hypertension) 04/10/2014  . Inguinal hernia without mention of obstruction or gangrene, unilateral or unspecified, (not specified as recurrent)-right 01/30/2014    Orientation ACTIVITIES/SOCIAL BLADDER RESPIRATION    Self, Time, Situation, Place  Family supportive Continent Normal  BEHAVIORAL SYMPTOMS/MOOD NEUROLOGICAL BOWEL NUTRITION STATUS   (NONE)  (NONE) Continent Diet (HEART HEALTHY )  PHYSICIAN VISITS COMMUNICATION OF NEEDS Height & Weight Skin    Verbally 5\' 10"  (177.8 cm) 196 lbs. Normal          AMBULATORY STATUS RESPIRATION    Assist independent Normal      Personal Care Assistance Level of Assistance  Dressing     Dressing Assistance: Limited assistance      Functional Limitations Info   (NONE)             SPECIAL CARE FACTORS FREQUENCY  PT (By licensed PT), OT (By licensed OT)     PT Frequency: 4 OT Frequency: 3           Additional Factors Info  Code Status, Allergies Code Status Info: FULL  Allergies Info: Allergies:  Penicillins, Sudafed           Current Medications (06/04/2015): Current Facility-Administered Medications  Medication Dose Route Frequency Provider Last Rate Last Dose  . aspirin suppository 300 mg  300 mg Rectal Daily Lily Kocher, MD       Or  . aspirin tablet 325 mg  325 mg Oral Daily Lily Kocher, MD   325 mg at 06/04/15 1014  . diclofenac sodium (VOLTAREN) 1 % transdermal gel 2 g  2 g Topical TID PRN Kelvin Cellar, MD   2 g at 06/03/15 1138  . hydrALAZINE (APRESOLINE) injection 20 mg  20 mg Intravenous Q4H PRN Lily Kocher, MD   20 mg at 06/03/15 0616  . hydrochlorothiazide (HYDRODIURIL) tablet 25 mg  25 mg Oral Daily Lily Kocher, MD   25 mg at 06/04/15 1014  . HYDROcodone-acetaminophen (NORCO/VICODIN) 5-325 MG per tablet 1 tablet  1 tablet Oral Q6H PRN Kelvin Cellar, MD   1 tablet at 06/04/15 0443  . isosorbide mononitrate (IMDUR) 24 hr tablet 60 mg  60 mg Oral Daily Kelvin Cellar, MD   60 mg at 06/04/15 1014  . lisinopril (PRINIVIL,ZESTRIL) tablet 20 mg  20 mg Oral Daily Lily Kocher, MD   20 mg at 06/04/15 1014  . LORazepam (ATIVAN) tablet 0.5 mg  0.5 mg Oral Q8H PRN Kelvin Cellar, MD   0.5 mg at 06/02/15 1546  . morphine 2 MG/ML injection 2 mg  2 mg Intravenous Once Pollie Friar, PA-C   Stopped at 06/02/15 0300  . nicotine (NICODERM CQ - dosed in mg/24 hours) patch 14 mg  14 mg Transdermal Daily Mickel Baas A [place], PA-C   14 mg at 06/04/15 1014  . promethazine (PHENERGAN) tablet 12.5 mg  12.5 mg Oral Q6H PRN Hewitt Shorts Hard[place], PA-C      . senna-docusate (Senokot-S) tablet 1 tablet  1 tablet Oral QHS PRN Lily Kocher, MD   1 tablet at 06/02/15 2144   Do not use this list as official medication orders. Please verify with discharge summary.  Discharge Medications:   Medication List    STOP taking these medications        SLEEP AID PO      TAKE these medications        aspirin 325 MG tablet  Take 1 tablet (325 mg total) by mouth daily.     diclofenac sodium 1 % Gel   Commonly known as:  VOLTAREN  Apply 2 g topically 3 (three) times daily as needed (Severe knee pain).     hydrochlorothiazide 25 MG tablet  Commonly known as:  HYDRODIURIL  Take 1 tablet (25 mg total) by mouth daily.     HYDROcodone-acetaminophen 5-325 MG tablet  Commonly known as:  NORCO/VICODIN  Take 1 tablet by mouth every 6 (six) hours as needed for severe pain.     isosorbide mononitrate 60 MG 24 hr tablet  Commonly known as:  IMDUR  Take 1 tablet (60 mg total) by mouth daily.     lisinopril 20 MG tablet  Commonly known as:  PRINIVIL,ZESTRIL  Take 20 mg by mouth daily.     LORazepam 0.5 MG tablet  Commonly known as:  ATIVAN  Take 1 tablet (0.5 mg total) by mouth every 8 (eight) hours as needed for anxiety.     nicotine 14 mg/24hr patch  Commonly known as:  NICODERM CQ - dosed in mg/24 hours  Place 1 patch (14 mg total) onto the skin daily.     senna-docusate 8.6-50 MG tablet  Commonly known as:  Senokot-S  Take 1 tablet by mouth at bedtime as needed for moderate constipation.        Relevant Imaging Results:  Relevant Lab Results:  Recent Labs    Additional Information SSN [id number]  Glendon Axe, MSW, LCSWA [phone] 06/04/2015 1:26 PM

## 2015-06-04 NOTE — Progress Notes (Signed)
Occupational Therapy Treatment Patient Details Name: [name] MRN: [id number] DOB: February 13, 1951 Today's Date: 06/04/2015    History of present illness [name] is an 64 y.o. male history of hypertension, hyperlipidemia and arthritis, who presented with new onset weakness involving left arm and left p.m. first noticed when he woke up on the morning of 05/28/2015. He was last known well at bedtime at 10:30 PM on the previous night. He has no previous history of stroke nor TIA. He has not been on antiplatelet therapy. MRI of his brain showed small right paramedian pontine ischemic stroke.   OT comments  Pt making good progress with adls.  Cont to be concerned about multiple steps to get into house although pt states he may be able to go in back door that has fewer steps.  Fiance works full time but now states she may quit to stay home with pt. When fiance allows pt to do for himself he is at min guard level of care overall with basic adls.  Follow Up Recommendations  CIR    Equipment Recommendations  Tub/shower seat    Recommendations for Other Services      Precautions / Restrictions Precautions Precautions: Fall Restrictions Weight Bearing Restrictions: No       Mobility Bed Mobility               General bed mobility comments: pt up on EOB on arrival.  Transfers Overall transfer level: Needs assistance Equipment used: 1 person hand held assist Transfers: Sit to/from Stand Sit to Stand: Supervision         General transfer comment: cues for hand placemetn    Balance Overall balance assessment: Needs assistance         Standing balance support: Bilateral upper extremity supported;During functional activity Standing balance-Leahy Scale: Fair Standing balance comment: Pt able to let go of walker and sink to groom in standing for 30 seconds-45 seconds.                   ADL Overall ADL's : Needs assistance/impaired Eating/Feeding:  Sitting;Set up Eating/Feeding Details (indicate cue type and reason): Pt using BUEs more to open containers today.  Needs assist to cut food. Grooming: Wash/dry hands;Wash/dry face;Oral care;Standing;Supervision/safety Grooming Details (indicate cue type and reason): Pt stood safely at sink to groom w/o physical assist          Upper Body Dressing : Set up;Sitting   Lower Body Dressing: Set up;Sit to/from stand Lower Body Dressing Details (indicate cue type and reason): Pt can fully dress self sitting on EOB and standing to pull pants up only with S. Toilet Transfer: Supervision/safety;Ambulation;RW;Comfort height toilet;Grab bars Toilet Transfer Details (indicate cue type and reason): no hands on assist needed walking to bathroom. Toileting- Clothing Manipulation and Hygiene: Supervision/safety;Sit to/from stand       Functional mobility during ADLs: Min guard;Rolling walker General ADL Comments: PT doing a lot more of adls on his own today.  Pt required no physical assist to groom standing at sink or to complete all toileting tasks. Pt does get impulsive at times moving too quickly but slowed down with cues.      Vision                     Perception     Praxis      Cognition   Behavior During Therapy: [facility] for tasks assessed/performed Overall Cognitive Status: History of cognitive impairments - at baseline Area  of Impairment: Problem solving     Memory: Decreased short-term memory    Safety/Judgement: Decreased awareness of safety;Decreased awareness of deficits   Problem Solving: Requires verbal cues General Comments: Pt very upset that he can't go to rehab today. Pt refusing SNF stating his father fell and broke his hip in a SNF and is afraid of going there. Would rather go home.    Extremity/Trunk Assessment               Exercises     Shoulder Instructions       General Comments      Pertinent Vitals/ Pain       Pain Assessment: 0-10 Pain  Score: 3  Pain Location: R side Pain Descriptors / Indicators: Aching Pain Intervention(s): Limited activity within patient's tolerance;Monitored during session;Repositioned  Home Living                                          Prior Functioning/Environment              Frequency Min 3X/week     Progress Toward Goals  OT Goals(current goals can now be found in the care plan section)  Progress towards OT goals: Progressing toward goals  Acute Rehab OT Goals Patient Stated Goal: to get better OT Goal Formulation: With patient Time For Goal Achievement: 06/16/15 Potential to Achieve Goals: Good ADL Goals Pt Will Perform Eating: Independently;sitting Pt Will Perform Grooming: with modified independence;standing Pt Will Perform Upper Body Bathing: with modified independence;sitting Pt Will Perform Lower Body Bathing: with modified independence;sit to/from stand Pt Will Perform Lower Body Dressing: with modified independence;sit to/from stand Pt Will Transfer to Toilet: with modified independence;ambulating;regular height toilet Pt Will Perform Toileting - Clothing Manipulation and hygiene: with modified independence;sit to/from stand Pt Will Perform Tub/Shower Transfer: Tub transfer;with supervision;ambulating;shower seat;rolling walker  Plan Discharge plan remains appropriate;Other (comment) (if pt cant go to rehab, refuses SNF, rec HHOT)    Co-evaluation                 End of Session Equipment Utilized During Treatment: Rolling walker;Gait belt   Activity Tolerance Patient tolerated treatment well   Patient Left in bed;with call bell/phone within reach;with bed alarm set   Nurse Communication Mobility status        Time: 1300-1315 OT Time Calculation (min): 15 min  Charges: OT General Charges $OT Visit: 1 Procedure OT Treatments $Self Care/Home Management : 8-22 mins  Glenford Peers 06/04/2015, 1:24 PM  [phone]

## 2015-06-04 NOTE — Care Management Note (Signed)
Case Management Note  Patient Details  Name: [name] MRN: [id number] Date of Birth: Dec 07, 1950  Subjective/Objective:                    Action/Plan: Met with patient and wife to discuss to discuss discharge planning.  CM spoke with Genie, CIR liaison, who states that patient's insurance issued a second denial after the peer-to-peer.  This information was presented to patient, who has declined discharge to SNF.  Patient prefers to go home with home health services and has chosen [facility].  Miranda with [facility] was notified and has accepted the referral for discharge home today.  [facility] was notified of need for equipment prior to discharge.  Pt is declining the recommended hospital bed.  CSW to assist with arranging transportation home. Bedside RN updated.  Expected Discharge Date:                  Expected Discharge Plan:  [place]  In-House Referral:  Clinical Social Work  Discharge planning Services  CM Consult  Post Acute Care Choice:  Durable Medical Equipment, Home Health Choice offered to:  Patient  DME Arranged:  3-N-1, Tub bench, Walker rolling, Hospital bed DME Agency:  [place]:  RN, PT, OT, Nurse's Aide, [place], Social Work [employer] Agency:  [place]  Status of Service:  In process, will continue to follow  Medicare Important Message Given:  Yes Date Medicare IM Given:    Medicare IM give by:    Date Additional Medicare IM Given:    Additional Medicare Important Message give by:     If discussed at [place] of Stay Meetings, dates discussed:    Additional Comments:  Rolm Baptise, RN 06/04/2015, 5:02 PM

## 2015-06-04 NOTE — Progress Notes (Signed)
Physical Therapy Treatment Patient Details Name: [name] MRN: [id number] DOB: 1951/04/30 Today's Date: 06/04/2015    History of Present Illness [name] is an 64 y.o. male history of hypertension, hyperlipidemia and arthritis, who presented with new onset weakness involving left arm and left p.m. first noticed when he woke up on the morning of 05/28/2015. He was last known well at bedtime at 10:30 PM on the previous night. He has no previous history of stroke nor TIA. He has not been on antiplatelet therapy. MRI of his brain showed small right paramedian pontine ischemic stroke.    PT Comments    Patient able to negotiate stairs slowly with A and railing, but likely not safe to access second floor of due L LE weakness and pt reporting symptoms consistent with claudication.  Feel CIR level rehab indicated for maximized mobility and safety.  Will continue skilled PT in acute setting.  Follow Up Recommendations  CIR     Equipment Recommendations  3in1 (PT);Hospital bed;Rolling walker with 5" wheels    Recommendations for Other Services       Precautions / Restrictions Precautions Precautions: Fall Restrictions Weight Bearing Restrictions: No    Mobility  Bed Mobility         Supine to sit: Min assist Sit to supine: Supervision   General bed mobility comments: due to sitting close to EOB  Transfers Overall transfer level: Needs assistance Equipment used: Rolling walker (2 wheeled) Transfers: Sit to/from Stand Sit to Stand: Supervision         General transfer comment: assist for safety  Ambulation/Gait Ambulation/Gait assistance: Min guard;Supervision Ambulation Distance (Feet): 150 Feet Assistive device: Rolling walker (2 wheeled) Gait Pattern/deviations: Step-through pattern;Shuffle;Trunk flexed;Decreased stride length Gait velocity: very slow and labored    General Gait Details: A with walker through small space sideways, heavy UE reliance,  obtained walker that was taller than one in his room; noted L LE buckling at times   Stairs Stairs: Yes Stairs assistance: Mod assist Stair Management: One rail Right;Two rails;Step to pattern;Forwards;Sideways Number of Stairs: 2 (x2) General stair comments: first time forwards with R LE first to ascend and L to descend due to L LE weakness and bilat rails one episode L LE buckling; then sideways for simulating access to second floor of home with R rail so L first with min/mod A  Wheelchair Mobility    Modified Rankin (Stroke Patients Only) Modified Rankin (Stroke Patients Only) Pre-Morbid Rankin Score: No symptoms Modified Rankin: Moderately severe disability     Balance Overall balance assessment: Needs assistance   Sitting balance-Leahy Scale: Fair     Standing balance support: Bilateral upper extremity supported;During functional activity Standing balance-Leahy Scale: Fair Standing balance comment: Pt able to let go of walker and sink to groom in standing for 30 seconds-45 seconds.                    Cognition Arousal/Alertness: Awake/alert Behavior During Therapy: WFL for tasks assessed/performed Overall Cognitive Status: History of cognitive impairments - at baseline Area of Impairment: Problem solving     Memory: Decreased short-term memory   Safety/Judgement: Decreased awareness of safety;Decreased awareness of deficits   Problem Solving: Requires verbal cues General Comments: Pt very upset that he can't go to rehab today. Pt refusing SNF stating his father fell and broke his hip in a SNF and is afraid of going there. Would rather go home.    Exercises      General  Comments General comments (skin integrity, edema, etc.): discussed plans for home with assist, HHPT, and DME      Pertinent Vitals/Pain Pain Assessment: 0-10 Pain Score: 3  Faces Pain Scale: Hurts a little bit Pain Location: L leg at times Pain Descriptors / Indicators: Aching Pain  Intervention(s): Monitored during session    Home Living                      Prior Function            PT Goals (current goals can now be found in the care plan section) Acute Rehab PT Goals Patient Stated Goal: to get better Progress towards PT goals: Progressing toward goals    Frequency  Min 4X/week    PT Plan Current plan remains appropriate    Co-evaluation             End of Session Equipment Utilized During Treatment: Gait belt Activity Tolerance: Patient limited by fatigue Patient left: in bed;with call bell/phone within reach;with family/visitor present     Time: 1420-1506 PT Time Calculation (min) (ACUTE ONLY): 46 min  Charges:  $Gait Training: 23-37 mins $Self Care/Home Management: 8-22                    G Codes:      WYNN,CYNDI 06-23-15, 3:51 PM  Magda Kiel, [place] Jun 23, 2015

## 2015-06-04 NOTE — Clinical Social Work Note (Signed)
CSW has arranged transportation via [facility]. Address confirmed. Significant other aware at bedside.   Clinical Social Worker will sign off for now as social work intervention is no longer needed. Please consult [place] again if new need arises.  Glendon Axe, MSW, LCSWA [phone] 06/04/2015 5:56 PM

## 2015-06-04 NOTE — Clinical Social Work Placement (Signed)
   CLINICAL SOCIAL WORK PLACEMENT  NOTE  Date:  06/04/2015  Patient Details  Name: [name] MRN: [id number] Date of Birth: [birth year]  Clinical Social Work is seeking post-discharge placement for this patient at the [place] level of care (*CSW will initial, date and re-position this form in  chart as items are completed):  Yes   Patient/family provided with [place] Work Department's list of facilities offering this level of care within the geographic area requested by the patient (or if unable, by the patient's family).  Yes   Patient/family informed of their freedom to choose among providers that offer the needed level of care, that participate in Medicare, Medicaid or managed care program needed by the patient, have an available bed and are willing to accept the patient.  Yes   Patient/family informed of [facility]'s ownership interest in [facility] and [facility], as well as of the fact that they are under no obligation to receive care at these facilities.  PASRR submitted to EDS on 06/04/15     PASRR number received on 06/04/15     Existing PASRR number confirmed on       FL2 transmitted to all facilities in geographic area requested by pt/family on 06/04/15     FL2 transmitted to all facilities within larger geographic area on       Patient informed that his/her managed care company has contracts with or will negotiate with certain facilities, including the following:        Yes   Patient/family informed of bed offers received.  Patient chooses bed at       Physician recommends and patient chooses bed at      Patient to be transferred to   on  .  Patient to be transferred to facility by       Patient family notified on   of transfer.  Name of family member notified:        PHYSICIAN       Additional Comment:    _______________________________________________ Glendon Axe, MSW, [place] [phone] 06/04/2015 5:14 PM

## 2015-06-10 ENCOUNTER — Telehealth: Payer: Self-pay | Admitting: Neurology

## 2015-06-10 NOTE — Telephone Encounter (Signed)
Rn call Beth from [facility] about patients blood pressure. Pt has not been seen at [facility] and has not schedule an appt for follow up. Rn explain patient can call his PCP for his blood pressure issues and medication adjustments.Beth stated patient has an appt with his PCP on Monday for an appt. Beth stated patient knows to go to the hospital if he starts having any stroke symptoms. Pt will call back to schedule an appt.

## 2015-06-10 NOTE — Telephone Encounter (Signed)
Beth with [facility] is calling in regard to a patient Dr. Leonie Man saw in the hospital, he has not been seen here as yet.  She stated his BP has been running high today, 200/90.  She was wanting advice as to what to do. I stated I don't think Dr. Leonie Man would advise until he is seen here.  Please advise her or let me know and I will call her back.

## 2015-06-11 ENCOUNTER — Telehealth: Payer: Self-pay

## 2015-06-11 NOTE — Telephone Encounter (Signed)
[facility] called to say Dr Doolittle's pt just got out of [facility] and the diagnosis was HTN You may call [phone] if needed

## 2015-06-13 NOTE — Telephone Encounter (Signed)
But [facility] is PCP and Ive never seen him--call [facility] to correct

## 2015-06-17 NOTE — Telephone Encounter (Signed)
SInce we did not have a name of someone to speak to at [facility], they are unable to do this through customer service.

## 2015-07-10 ENCOUNTER — Ambulatory Visit (HOSPITAL_COMMUNITY)
Admission: RE | Admit: 2015-07-10 | Discharge: 2015-07-10 | Disposition: A | Discharge status: Home / Self Care | Payer: Medicare Other | Source: Ambulatory Visit | Attending: Family Medicine | Admitting: Family Medicine

## 2015-07-10 DIAGNOSIS — Z0189 Encounter for other specified special examinations: Secondary | ICD-10-CM | POA: Diagnosis present

## 2015-07-10 DIAGNOSIS — D6489 Other specified anemias: Secondary | ICD-10-CM | POA: Insufficient documentation

## 2015-07-10 LAB — PREPARE RBC (CROSSMATCH)

## 2015-07-10 MED ORDER — SODIUM CHLORIDE 0.9 % IV SOLN
Freq: Once | INTRAVENOUS | Status: AC
  Administered 2015-07-10: 10:00:00 via INTRAVENOUS

## 2015-07-10 NOTE — Progress Notes (Signed)
Diagnosis: Chronic anemia  MD: Clayburn Pert  Procedure: Pt was typed and crossed match and received 2 units of PRBCs  Condition during procedure: Pt tolerated well  Condition post procedure: Pt alert, oriented and ambulatory

## 2015-07-11 LAB — TYPE AND SCREEN
ABO/RH(D): O POS
Antibody Screen: NEGATIVE
Unit division: 0
Unit division: 0

## 2015-07-23 ENCOUNTER — Ambulatory Visit: Payer: Self-pay | Admitting: Neurology

## 2015-07-24 ENCOUNTER — Encounter: Payer: Self-pay | Admitting: Neurology

## 2015-07-29 ENCOUNTER — Telehealth: Payer: Self-pay | Admitting: Neurology

## 2015-07-29 NOTE — Telephone Encounter (Signed)
error 

## 2015-10-16 ENCOUNTER — Ambulatory Visit: Payer: Medicare Other | Admitting: Neurology

## 2015-10-17 ENCOUNTER — Ambulatory Visit (HOSPITAL_COMMUNITY)
Admission: RE | Admit: 2015-10-17 | Discharge: 2015-10-17 | Disposition: A | Discharge status: Home / Self Care | Payer: Medicare Other | Source: Ambulatory Visit | Attending: Family Medicine | Admitting: Family Medicine

## 2015-10-17 DIAGNOSIS — D649 Anemia, unspecified: Secondary | ICD-10-CM | POA: Diagnosis present

## 2015-10-17 DIAGNOSIS — R195 Other fecal abnormalities: Secondary | ICD-10-CM | POA: Insufficient documentation

## 2015-10-17 LAB — PREPARE RBC (CROSSMATCH)

## 2015-10-17 MED ORDER — SODIUM CHLORIDE 0.9 % IV SOLN: Freq: Once | INTRAVENOUS | Status: DC

## 2015-10-17 NOTE — Progress Notes (Signed)
Diagnosis: History of anemia; positive hemoccult; Hb = 5.9  Ordering Provider: Lillia Corporal, MD  Procedure: Pt was typed and crossed match and received 2 units of PRBCs  Condition during procedure: Pt tolerated well  Condition post procedure: Pt alert, oriented and ambulatory; no complications noted

## 2015-10-17 NOTE — Discharge Instructions (Signed)

## 2015-10-20 ENCOUNTER — Encounter: Payer: Self-pay | Admitting: Neurology

## 2015-10-20 LAB — TYPE AND SCREEN
ABO/RH(D): O POS
Antibody Screen: NEGATIVE
Unit division: 0
Unit division: 0

## 2015-10-24 ENCOUNTER — Encounter (HOSPITAL_COMMUNITY): Payer: Self-pay | Admitting: *Deleted

## 2015-10-24 ENCOUNTER — Inpatient Hospital Stay (HOSPITAL_COMMUNITY)
Admission: EM | Admit: 2015-10-24 | Discharge: 2015-10-26 | DRG: 812 | Disposition: A | Discharge status: Home / Self Care | Payer: Medicare Other | Attending: Internal Medicine | Admitting: Internal Medicine

## 2015-10-24 DIAGNOSIS — F17210 Nicotine dependence, cigarettes, uncomplicated: Secondary | ICD-10-CM | POA: Diagnosis present

## 2015-10-24 DIAGNOSIS — K219 Gastro-esophageal reflux disease without esophagitis: Secondary | ICD-10-CM | POA: Diagnosis present

## 2015-10-24 DIAGNOSIS — M1990 Unspecified osteoarthritis, unspecified site: Secondary | ICD-10-CM | POA: Diagnosis present

## 2015-10-24 DIAGNOSIS — Z8673 Personal history of transient ischemic attack (TIA), and cerebral infarction without residual deficits: Secondary | ICD-10-CM

## 2015-10-24 DIAGNOSIS — K921 Melena: Secondary | ICD-10-CM | POA: Diagnosis present

## 2015-10-24 DIAGNOSIS — Z7982 Long term (current) use of aspirin: Secondary | ICD-10-CM

## 2015-10-24 DIAGNOSIS — Z720 Tobacco use: Secondary | ICD-10-CM

## 2015-10-24 DIAGNOSIS — K922 Gastrointestinal hemorrhage, unspecified: Secondary | ICD-10-CM | POA: Diagnosis not present

## 2015-10-24 DIAGNOSIS — E785 Hyperlipidemia, unspecified: Secondary | ICD-10-CM | POA: Diagnosis present

## 2015-10-24 DIAGNOSIS — B1920 Unspecified viral hepatitis C without hepatic coma: Secondary | ICD-10-CM | POA: Diagnosis present

## 2015-10-24 DIAGNOSIS — M109 Gout, unspecified: Secondary | ICD-10-CM | POA: Diagnosis present

## 2015-10-24 DIAGNOSIS — D649 Anemia, unspecified: Secondary | ICD-10-CM | POA: Diagnosis not present

## 2015-10-24 DIAGNOSIS — Z800 Family history of malignant neoplasm of digestive organs: Secondary | ICD-10-CM

## 2015-10-24 DIAGNOSIS — I10 Essential (primary) hypertension: Secondary | ICD-10-CM | POA: Diagnosis present

## 2015-10-24 DIAGNOSIS — G4700 Insomnia, unspecified: Secondary | ICD-10-CM | POA: Diagnosis present

## 2015-10-24 DIAGNOSIS — Z79899 Other long term (current) drug therapy: Secondary | ICD-10-CM | POA: Diagnosis not present

## 2015-10-24 DIAGNOSIS — K449 Diaphragmatic hernia without obstruction or gangrene: Secondary | ICD-10-CM | POA: Diagnosis present

## 2015-10-24 DIAGNOSIS — K640 First degree hemorrhoids: Secondary | ICD-10-CM | POA: Diagnosis present

## 2015-10-24 DIAGNOSIS — H409 Unspecified glaucoma: Secondary | ICD-10-CM | POA: Diagnosis present

## 2015-10-24 DIAGNOSIS — D500 Iron deficiency anemia secondary to blood loss (chronic): Principal | ICD-10-CM | POA: Diagnosis present

## 2015-10-24 DIAGNOSIS — D509 Iron deficiency anemia, unspecified: Secondary | ICD-10-CM | POA: Diagnosis not present

## 2015-10-24 DIAGNOSIS — Z8711 Personal history of peptic ulcer disease: Secondary | ICD-10-CM | POA: Diagnosis not present

## 2015-10-24 DIAGNOSIS — R195 Other fecal abnormalities: Secondary | ICD-10-CM | POA: Diagnosis not present

## 2015-10-24 LAB — CBC
HCT: 22.1 % — ABNORMAL LOW (ref 39.0–52.0)
HCT: 25.6 % — ABNORMAL LOW (ref 39.0–52.0)
Hemoglobin: 6.2 g/dL — CL (ref 13.0–17.0)
Hemoglobin: 7.4 g/dL — ABNORMAL LOW (ref 13.0–17.0)
MCH: 19.7 pg — ABNORMAL LOW (ref 26.0–34.0)
MCH: 21 pg — ABNORMAL LOW (ref 26.0–34.0)
MCHC: 28.1 g/dL — ABNORMAL LOW (ref 30.0–36.0)
MCHC: 28.9 g/dL — ABNORMAL LOW (ref 30.0–36.0)
MCV: 70.2 fL — ABNORMAL LOW (ref 78.0–100.0)
MCV: 72.7 fL — ABNORMAL LOW (ref 78.0–100.0)
Platelets: 339 10*3/uL (ref 150–400)
Platelets: 304 10*3/uL (ref 150–400)
RBC: 3.15 MIL/uL — ABNORMAL LOW (ref 4.22–5.81)
RBC: 3.52 MIL/uL — ABNORMAL LOW (ref 4.22–5.81)
RDW: 22.1 % — ABNORMAL HIGH (ref 11.5–15.5)
RDW: 22.8 % — ABNORMAL HIGH (ref 11.5–15.5)
WBC: 5.4 10*3/uL (ref 4.0–10.5)
WBC: 5.8 10*3/uL (ref 4.0–10.5)

## 2015-10-24 LAB — POC OCCULT BLOOD, ED: Fecal Occult Bld: POSITIVE — AB

## 2015-10-24 LAB — FERRITIN: Ferritin: 1 ng/mL — ABNORMAL LOW (ref 24–336)

## 2015-10-24 LAB — COMPREHENSIVE METABOLIC PANEL
ALT: 18 U/L (ref 17–63)
AST: 19 U/L (ref 15–41)
Albumin: 3.5 g/dL (ref 3.5–5.0)
Alkaline Phosphatase: 72 U/L (ref 38–126)
Anion gap: 9 (ref 5–15)
BUN: 17 mg/dL (ref 6–20)
CO2: 21 mmol/L — ABNORMAL LOW (ref 22–32)
Calcium: 9 mg/dL (ref 8.9–10.3)
Chloride: 108 mmol/L (ref 101–111)
Creatinine, Ser: 0.93 mg/dL (ref 0.61–1.24)
GFR calc Af Amer: >60 mL/min (ref >60)
GFR calc non Af Amer: >60 mL/min (ref >60)
Glucose, Bld: 97 mg/dL (ref 65–99)
Potassium: 4.4 mmol/L (ref 3.5–5.1)
Sodium: 138 mmol/L (ref 135–145)
Total Bilirubin: 0.6 mg/dL (ref 0.3–1.2)
Total Protein: 6.2 g/dL — ABNORMAL LOW (ref 6.5–8.1)

## 2015-10-24 LAB — IRON AND TIBC
Iron: 23 ug/dL — ABNORMAL LOW (ref 45–182)
Saturation Ratios: 4 % — ABNORMAL LOW (ref 17.9–39.5)
TIBC: 560 ug/dL — ABNORMAL HIGH (ref 250–450)
UIBC: 537 ug/dL

## 2015-10-24 LAB — PREPARE RBC (CROSSMATCH)

## 2015-10-24 LAB — RETICULOCYTES
RBC.: 3.16 MIL/uL — ABNORMAL LOW (ref 4.22–5.81)
Retic Count, Absolute: 50.6 10*3/uL (ref 19.0–186.0)
Retic Ct Pct: 1.6 % (ref 0.4–3.1)

## 2015-10-24 LAB — FOLATE: Folate: 9.3 ng/mL (ref >5.9)

## 2015-10-24 LAB — VITAMIN B12: Vitamin B-12: 291 pg/mL (ref 180–914)

## 2015-10-24 MED ORDER — PANTOPRAZOLE SODIUM 40 MG IV SOLR: 40.0000 mg | Freq: Two times a day (BID) | INTRAVENOUS | Status: DC

## 2015-10-24 MED ORDER — ZOLPIDEM TARTRATE 5 MG PO TABS
5.0000 mg | ORAL_TABLET | Freq: Every day | ORAL | Status: DC
  Administered 2015-10-24 – 2015-10-25 (×2): 5 mg via ORAL
  Filled 2015-10-24 (×2): qty 1

## 2015-10-24 MED ORDER — SODIUM CHLORIDE 0.9 % IV SOLN
10.0000 mL/h | Freq: Once | INTRAVENOUS | Status: AC
  Administered 2015-10-24: 10 mL/h via INTRAVENOUS

## 2015-10-24 MED ORDER — HYDROCHLOROTHIAZIDE 25 MG PO TABS: 25.0000 mg | ORAL_TABLET | Freq: Every day | ORAL | Status: DC

## 2015-10-24 MED ORDER — CYCLOBENZAPRINE HCL 10 MG PO TABS
10.0000 mg | ORAL_TABLET | Freq: Every day | ORAL | Status: DC
  Administered 2015-10-25: 10 mg via ORAL
  Filled 2015-10-24: qty 1

## 2015-10-24 MED ORDER — PANTOPRAZOLE SODIUM 40 MG IV SOLR
80.0000 mg | Freq: Once | INTRAVENOUS | Status: AC
  Administered 2015-10-24: 80 mg via INTRAVENOUS
  Filled 2015-10-24: qty 80

## 2015-10-24 MED ORDER — ONDANSETRON HCL 4 MG PO TABS: 4.0000 mg | ORAL_TABLET | Freq: Four times a day (QID) | ORAL | Status: DC | PRN

## 2015-10-24 MED ORDER — NICOTINE 21 MG/24HR TD PT24
21.0000 mg | MEDICATED_PATCH | Freq: Every day | TRANSDERMAL | Status: DC
  Administered 2015-10-24 – 2015-10-26 (×3): 21 mg via TRANSDERMAL
  Filled 2015-10-24 (×3): qty 1

## 2015-10-24 MED ORDER — PANTOPRAZOLE SODIUM 40 MG IV SOLR
8.0000 mg/h | INTRAVENOUS | Status: DC
  Administered 2015-10-24 – 2015-10-25 (×2): 8 mg/h via INTRAVENOUS
  Filled 2015-10-24 (×5): qty 80

## 2015-10-24 MED ORDER — ISOSORBIDE MONONITRATE ER 60 MG PO TB24
60.0000 mg | ORAL_TABLET | Freq: Every day | ORAL | Status: DC
  Administered 2015-10-25 – 2015-10-26 (×2): 60 mg via ORAL
  Filled 2015-10-24 (×2): qty 1

## 2015-10-24 MED ORDER — HYDROCODONE-ACETAMINOPHEN 5-325 MG PO TABS
1.0000 | ORAL_TABLET | Freq: Four times a day (QID) | ORAL | Status: DC | PRN
  Administered 2015-10-24 – 2015-10-25 (×4): 1 via ORAL
  Filled 2015-10-24 (×4): qty 1

## 2015-10-24 MED ORDER — LORAZEPAM 0.5 MG PO TABS: 0.5000 mg | ORAL_TABLET | Freq: Three times a day (TID) | ORAL | Status: DC | PRN

## 2015-10-24 MED ORDER — BISACODYL 5 MG PO TBEC: 5.0000 mg | DELAYED_RELEASE_TABLET | Freq: Every day | ORAL | Status: DC | PRN

## 2015-10-24 MED ORDER — LISINOPRIL 10 MG PO TABS: 10.0000 mg | ORAL_TABLET | Freq: Every day | ORAL | Status: DC

## 2015-10-24 MED ORDER — ONDANSETRON HCL 4 MG/2ML IJ SOLN: 4.0000 mg | Freq: Four times a day (QID) | INTRAMUSCULAR | Status: DC | PRN

## 2015-10-24 MED ORDER — ACETAMINOPHEN 325 MG PO TABS: 650.0000 mg | ORAL_TABLET | Freq: Four times a day (QID) | ORAL | Status: DC | PRN

## 2015-10-24 MED ORDER — SODIUM CHLORIDE 0.9 % IV SOLN
INTRAVENOUS | Status: DC
  Administered 2015-10-24: 21:00:00 via INTRAVENOUS

## 2015-10-24 MED ORDER — ACETAMINOPHEN 650 MG RE SUPP: 650.0000 mg | Freq: Four times a day (QID) | RECTAL | Status: DC | PRN

## 2015-10-24 NOTE — H&P (Signed)
[facility] History and Physical  [name] [id number] DOB: 12-Jul-1951 DOA: 10/24/2015  Referring physician: Emergency Department PCP: Smothers, Andree Elk, NP   CHIEF COMPLAINT:   anemia                HPI: [name] is a 65 y.o. male sent by primary care doctor to ED for anemia.   Patient very sleepy, offers very little information. He didn't sleep well last night. Wife told emergency room physician that patient had 2 units of blood transfused last week . Patient endorses black stools since last of December. He takes a full aspirin daily but denies use of any other NSAIDs. Patient gives a history of an ulcer at age 79. He endorses chronic nausea and vomiting, no coffee-ground emesis however. It has no abdominal pain. Stools are otherwise normal. Weight stable. Patient has no chest pain or shortness of breath. No fatigue.  ED workup:   FOBT positive Microcytic anemia hgb 6.2 , baseline 13.3 in November    Medications  pantoprazole (PROTONIX) 80 mg in sodium chloride 0.9 % 100 mL IVPB (not administered)  nicotine (NICODERM CQ - dosed in mg/24 hours) patch 21 mg (not administered)  0.9 %  sodium chloride infusion (10 mL/hr Intravenous New Bag/Given 10/24/15 1418)    Review of Systems  Constitutional: Negative.   HENT: Negative.   Eyes: Negative.   Respiratory: Negative.   Cardiovascular: Negative.   Gastrointestinal: Positive for blood in stool.  Genitourinary: Negative.   Musculoskeletal: Negative.   Skin: Negative.   Neurological: Negative.   Endo/Heme/Allergies: Negative.   Psychiatric/Behavioral: Negative.     Past Medical History  Diagnosis Date  . Hyperlipidemia     not on any meds  . Hypertension     takes Amlodipine and Lisinopril daily  . Insomnia     takes Ambien nightly  . History of blood transfusion     no abnormal reaction noted  . PONV (postoperative nausea and vomiting)   . Shortness of breath dyspnea     rarely but when notices he  can be lying/sitting/exertion.Dr.Hochrein is aware per pt  . Headache     occasionally  . Numbness     both legs occasionally  . Arthritis   . Chronic back pain   . History of gout     doesn't take any meds  . GERD (gastroesophageal reflux disease)     uses Baking Soda  . Hepatitis C   . Urinary frequency   . Urinary urgency   . Glaucoma     right eye  . Cancer [facility])     Prostate CA  . GIB (gastrointestinal bleeding) 2012  . Ischemic colitis ([place]) 2012   Past Surgical History  Procedure Laterality Date  . Appendectomy    . Small intestine surgery    . Multiple abdominal surgeries      total of 13  . Prostatectomy    . Colonoscopy    . Esophagogastroduodenoscopy    . Inguinal hernia repair Right 11/04/2014    Procedure: RIGHT INGUINAL HERNIA REPAIR WITH MESH;  Surgeon: Jackolyn Confer, MD;  Location: [place];  Service: General;  Laterality: Right;  . Mass excision N/A 11/04/2014    Procedure: REMOVAL OF RIGHT GROIN SOFT TISSUE MASS;  Surgeon: Jackolyn Confer, MD;  Location: [place];  Service: General;  Laterality: N/A;    SOCIAL HISTORY:  reports that he has been smoking Cigarettes.  He has a 25 pack-year smoking history. He  has never used smokeless tobacco. He reports that he does not drink alcohol or use illicit drugs. Lives:   With wife at home   Assistive devices:   None needed for ambulation.   Allergies  Allergen Reactions  . Penicillins Anaphylaxis  . Sudafed [Pseudoephedrine] Other (See Comments)    Heart "races"    Family History  Problem Relation Age of Onset  . Alzheimer's disease Father   . Cancer Mother     Lymph node  . Heart failure Brother 45    Transplant 13 years ago  . CAD Brother   . Heart disease Sister 76    MI    Prior to Admission medications   Medication Sig Start Date End Date Taking? Authorizing Provider  aspirin 325 MG tablet Take 1 tablet (325 mg total) by mouth daily. 06/04/15  Yes Geradine Girt, DO  cyclobenzaprine (FLEXERIL) 10 MG  tablet Take 10 mg by mouth at bedtime. 10/06/15  Yes Historical Provider, MD  diclofenac sodium (VOLTAREN) 1 % GEL Apply 2 g topically 3 (three) times daily as needed (Severe knee pain). 06/04/15  Yes Geradine Girt, DO  hydrochlorothiazide (HYDRODIURIL) 25 MG tablet Take 1 tablet (25 mg total) by mouth daily. 06/04/15  Yes Geradine Girt, DO  HYDROcodone-acetaminophen (NORCO/VICODIN) 5-325 MG tablet Take 1 tablet by mouth every 6 (six) hours as needed for severe pain. 06/04/15  Yes Geradine Girt, DO  isosorbide mononitrate (IMDUR) 60 MG 24 hr tablet Take 1 tablet (60 mg total) by mouth daily. 06/04/15  Yes Geradine Girt, DO  lisinopril (PRINIVIL,ZESTRIL) 10 MG tablet Take 10 mg by mouth daily. 10/06/15  Yes Historical Provider, MD  LORazepam (ATIVAN) 0.5 MG tablet Take 1 tablet (0.5 mg total) by mouth every 8 (eight) hours as needed for anxiety. 06/04/15  Yes Geradine Girt, DO  zolpidem (AMBIEN) 10 MG tablet Take 10 mg by mouth at bedtime. 10/10/15  Yes Historical Provider, MD   PHYSICAL EXAM: Filed Vitals:   10/24/15 1150 10/24/15 1400 10/24/15 1437  BP: 135/64 125/77 145/74  Pulse: 72 65 62  Temp: 97.8 F (36.6 C) 98.6 F (37 C) 98.5 F (36.9 C)  TempSrc: Oral Oral   Resp: 18 21 20   Height: 5' 9.5" (1.765 m)    Weight: 97.07 kg (214 lb)    SpO2: 100% 100%     Wt Readings from Last 3 Encounters:  10/24/15 97.07 kg (214 lb)  05/31/15 89.177 kg (196 lb 9.6 oz)  05/30/15 92.987 kg (205 lb)    General:  Sleepy well developed black male. Appears calm and comfortable Eyes: PER, normal lids, irises & conjunctiva ENT: grossly normal hearing, lips & tongue Neck: no LAD, no masses Cardiovascular: RRR, No LE edema.  Respiratory: Respirations even and unlabored. Normal respiratory effort. Lungs CTA bilaterally, no wheezes / rales .   Abdomen: soft, non-distended, non-tender, active bowel sounds. No obvious masses.  Skin: no rash seen on limited exam Musculoskeletal: grossly normal tone  BUE/BLE Psychiatric: cooperative but sleepy, speech fluent and appropriate Neurologic: grossly non-focal.         LABS ON ADMISSION:    Basic Metabolic Panel:  Recent Labs Lab 10/24/15 1155  NA 138  K 4.4  CL 108  CO2 21*  GLUCOSE 97  BUN 17  CREATININE 0.93  CALCIUM 9.0   Liver Function Tests:  Recent Labs Lab 10/24/15 1155  AST 19  ALT 18  ALKPHOS 72  BILITOT 0.6  PROT 6.2*  ALBUMIN 3.5    CBC:  Recent Labs Lab 10/24/15 1155  WBC 5.4  HGB 6.2*  HCT 22.1*  MCV 70.2*  PLT 339    CREATININE: 0.93 (10/24/15 1155) Estimated creatinine clearance - 90.6 mL/min   ASSESSMENT / PLAN   Severe iron deficiency anemia. Hgb 6.2,  down from 13 in November. Several week history of black stool, (heme + in ED). BUN normal, hemodynamically stable suggesting slow bleed. Heavy Aleve at one time but not in months. Patient poor historian but per wife patient had 2 units of blood last week. No recent GI workup. -admit to Medical bed-Observation for now -Blood transfusion in progress, 2 units ordered -PPI drip -Serial H&H -GI consult - [place] will see in am. Patient saw Dr. Lajoyce Corners in 2007 for colonoscopy for iron  deficiency anemia. Exam incomplete secondary to poor prep.   Hx of CVA November. MRI - Non-dominant R paramedian pontine infarct secondary to small vessel disease.  -Hold daily ASA for now  HTN. Controlled.  -continue home ACEI, Hydrodiuril, Imdur. He took home meds this am.   Tobacco abuse. Desires patch -Nicoderm patch  Hx of HCV per PMH. Normal appearing liver on CTscan with contrast Nov 2016    CONSULTANTS:    [facility]  Code Status: full code DVT Prophylaxis:SCDs Family Communication:  Patient sleepy but oriented and understands plan of care.  Disposition Plan: Discharge to home in 24-48 hours   Time spent: 60 minutes Tye Savoy  NP [facility] Pager [phone]

## 2015-10-24 NOTE — ED Provider Notes (Signed)
CSN: [id number]     Arrival date & time 10/24/15  1141 History   First MD Initiated Contact with Patient 10/24/15 1243     Chief Complaint  Patient presents with  . Abnormal Lab    low hemaglobin     (Consider location/radiation/quality/duration/timing/severity/associated sxs/prior Treatment) HPI   65 year old male with anemia. Patient is a very poor historian. His ex-wife is at bedside. She is very pleasant but she is not sure of many details of medical care. Recently noted to be anemic. He did receive 2 units of packed red blood cells last week. Repeat hemoglobin still remains low. He does endorse generalized weakness. He does report also been black as well. He feels like he has no energy. Does feel dizzy with changes of position.   Past Medical History  Diagnosis Date  . Hyperlipidemia     not on any meds  . Hypertension     takes Amlodipine and Lisinopril daily  . Insomnia     takes Ambien nightly  . History of blood transfusion     no abnormal reaction noted  . PONV (postoperative nausea and vomiting)   . Shortness of breath dyspnea     rarely but when notices he can be lying/sitting/exertion.Dr.Hochrein is aware per pt  . Headache     occasionally  . Numbness     both legs occasionally  . Arthritis   . Joint pain   . Joint swelling   . Chronic back pain   . History of gout     doesn't take any meds  . GERD (gastroesophageal reflux disease)     uses Baking Soda  . Hepatitis C   . Urinary frequency   . Urinary urgency   . Nocturia   . Glaucoma     right eye  . Cancer [facility])     Prostate CA  . GIB (gastrointestinal bleeding) 2012  . Ischemic colitis ([place]) 2012   Past Surgical History  Procedure Laterality Date  . Appendectomy    . Small intestine surgery    . Multiple abdominal surgeries      total of 13  . Prostatectomy    . Colonoscopy    . Esophagogastroduodenoscopy    . Inguinal hernia repair Right 11/04/2014    Procedure: RIGHT INGUINAL HERNIA REPAIR  WITH MESH;  Surgeon: Jackolyn Confer, MD;  Location: [place];  Service: General;  Laterality: Right;  . Mass excision N/A 11/04/2014    Procedure: REMOVAL OF RIGHT GROIN SOFT TISSUE MASS;  Surgeon: Jackolyn Confer, MD;  Location: [facility] OR;  Service: General;  Laterality: N/A;   Family History  Problem Relation Age of Onset  . Alzheimer's disease Father   . Cancer Mother     Lymph node  . Heart failure Brother 45    Transplant 13 years ago  . CAD Brother   . Heart disease Sister 79    MI   Social History  Substance Use Topics  . Smoking status: Current Every Day Smoker -- 0.50 packs/day for 50 years    Types: Cigarettes  . Smokeless tobacco: Never Used  . Alcohol Use: No     Comment: occasionally    Review of Systems  All systems reviewed and negative, other than as noted in HPI.   Allergies  Penicillins and Sudafed  Home Medications   Prior to Admission medications   Medication Sig Start Date End Date Taking? Authorizing Provider  aspirin 325 MG tablet Take 1 tablet (325 mg total)  by mouth daily. 06/04/15   Geradine Girt, DO  diclofenac sodium (VOLTAREN) 1 % GEL Apply 2 g topically 3 (three) times daily as needed (Severe knee pain). 06/04/15   Geradine Girt, DO  hydrochlorothiazide (HYDRODIURIL) 25 MG tablet Take 1 tablet (25 mg total) by mouth daily. 06/04/15   Geradine Girt, DO  HYDROcodone-acetaminophen (NORCO/VICODIN) 5-325 MG tablet Take 1 tablet by mouth every 6 (six) hours as needed for severe pain. 06/04/15   Geradine Girt, DO  isosorbide mononitrate (IMDUR) 60 MG 24 hr tablet Take 1 tablet (60 mg total) by mouth daily. 06/04/15   Geradine Girt, DO  LORazepam (ATIVAN) 0.5 MG tablet Take 1 tablet (0.5 mg total) by mouth every 8 (eight) hours as needed for anxiety. 06/04/15   Geradine Girt, DO  nicotine (NICODERM CQ - DOSED IN MG/24 HOURS) 14 mg/24hr patch Place 1 patch (14 mg total) onto the skin daily. 06/04/15   Geradine Girt, DO  senna-docusate (SENOKOT-S) 8.6-50  MG tablet Take 1 tablet by mouth at bedtime as needed for moderate constipation. 06/04/15   Jessica U Vann, DO   BP 135/64 mmHg  Pulse 72  Temp(Src) 97.8 F (36.6 C) (Oral)  Resp 18  Ht 5' 9.5" (1.765 m)  Wt 214 lb (97.07 kg)  BMI 31.16 kg/m2  SpO2 100% Physical Exam  Constitutional: He appears well-developed and well-nourished. No distress.  Laying in bed. Appears tired, but not toxic.  HENT:  Head: Normocephalic and atraumatic.  Eyes: Conjunctivae are normal. Right eye exhibits no discharge. Left eye exhibits no discharge.  Neck: Neck supple.  Cardiovascular: Normal rate, regular rhythm and normal heart sounds.  Exam reveals no gallop and no friction rub.   No murmur heard. Pulmonary/Chest: Effort normal and breath sounds normal. No respiratory distress.  Abdominal: Soft. He exhibits no distension. There is no tenderness.  Musculoskeletal: He exhibits no edema or tenderness.  Neurological: He is alert.  Skin: Skin is warm and dry.  Psychiatric: He has a normal mood and affect. His behavior is normal. Thought content normal.  Nursing note and vitals reviewed.   ED Course  Procedures (including critical care time) Labs Review Labs Reviewed  COMPREHENSIVE METABOLIC PANEL - Abnormal; Notable for the following:    CO2 21 (*)    Total Protein 6.2 (*)    All other components within normal limits  CBC - Abnormal; Notable for the following:    RBC 3.15 (*)    Hemoglobin 6.2 (*)    HCT 22.1 (*)    MCV 70.2 (*)    MCH 19.7 (*)    MCHC 28.1 (*)    RDW 22.1 (*)    All other components within normal limits  IRON AND TIBC - Abnormal; Notable for the following:    Iron 23 (*)    TIBC 560 (*)    Saturation Ratios 4 (*)    All other components within normal limits  FERRITIN - Abnormal; Notable for the following:    Ferritin 1 (*)    All other components within normal limits  RETICULOCYTES - Abnormal; Notable for the following:    RBC. 3.16 (*)    All other components within  normal limits  BASIC METABOLIC PANEL - Abnormal; Notable for the following:    Glucose, Bld 100 (*)    All other components within normal limits  CBC - Abnormal; Notable for the following:    RBC 3.52 (*)    Hemoglobin 7.4 (*)  HCT 25.6 (*)    MCV 72.7 (*)    MCH 21.0 (*)    MCHC 28.9 (*)    RDW 22.8 (*)    All other components within normal limits  CBC - Abnormal; Notable for the following:    RBC 3.63 (*)    Hemoglobin 7.5 (*)    HCT 26.2 (*)    MCV 72.2 (*)    MCH 20.7 (*)    MCHC 28.6 (*)    RDW 22.8 (*)    All other components within normal limits  CBC - Abnormal; Notable for the following:    Hemoglobin 9.4 (*)    HCT 32.5 (*)    MCV 73.4 (*)    MCH 21.2 (*)    MCHC 28.9 (*)    RDW 23.2 (*)    All other components within normal limits  PROTIME-INR - Abnormal; Notable for the following:    Prothrombin Time 17.0 (*)    All other components within normal limits  GLUCOSE, CAPILLARY - Abnormal; Notable for the following:    Glucose-Capillary 103 (*)    All other components within normal limits  POC OCCULT BLOOD, ED - Abnormal; Notable for the following:    Fecal Occult Bld POSITIVE (*)    All other components within normal limits  VITAMIN B12  FOLATE  TROPONIN I  TROPONIN I  TROPONIN I  HCV RNA QUANT  COMPREHENSIVE METABOLIC PANEL  TYPE AND SCREEN  PREPARE RBC (CROSSMATCH)  PREPARE RBC (CROSSMATCH)    Imaging Review No results found. I have personally reviewed and evaluated these images and lab results as part of my medical decision-making.   EKG Interpretation None      MDM   Final diagnoses:  Gastrointestinal hemorrhage, unspecified gastritis, unspecified gastrointestinal hemorrhage type  Anemia, iron deficiency    64yM with symptomatic anemia. Microcytic.  -Poor historian. Hard to get much useful history from him. Describes "back stool." Doesn't appear like melena to me currently. Check hemoccult. -Concerning enough for possible UGIB to  start protonix. Reports distant hx of PUD. -Check anemia panel. -Hemoglobin 6.2 today despite transfusion of 2u PRBC for hemoglobin of 5.9 on 3/31.  - Not tachycardic or hypotensive. Does seems extremely fatigued though which ex-wife concurs with. Will transfuse.     Virgel Manifold, MD 10/31/15 1255

## 2015-10-24 NOTE — ED Notes (Signed)
Critical hgb 6.2 called from the lab.

## 2015-10-24 NOTE — ED Notes (Signed)
Pt sent here by MD for possible blood transfusion.  Wife states pt had 2 units of blood transfused last week.

## 2015-10-24 NOTE — Progress Notes (Signed)
REcieved patient   From ED with ongoing blood  transfusion , alert and oriented, not in any distress. Will conitnue to monitor.

## 2015-10-24 NOTE — ED Notes (Signed)
Ordered clear liquid diet tray for patient.

## 2015-10-24 NOTE — ED Notes (Signed)
Admitting MD at bedside.

## 2015-10-24 NOTE — ED Notes (Signed)
Pt had blood transfusion on last  Friday and had dr appointment yesterday, unsure of they drew labs again but states they were called and told him his  H and H was low and they needed to come to ER, pt states that his stools have been black for " a  While ". Unsure how long. That the dr thinks he may have an ulcer that is bleeding

## 2015-10-25 DIAGNOSIS — D509 Iron deficiency anemia, unspecified: Secondary | ICD-10-CM

## 2015-10-25 DIAGNOSIS — R195 Other fecal abnormalities: Secondary | ICD-10-CM

## 2015-10-25 LAB — BASIC METABOLIC PANEL
Anion gap: 9 (ref 5–15)
BUN: 14 mg/dL (ref 6–20)
CO2: 22 mmol/L (ref 22–32)
Calcium: 8.9 mg/dL (ref 8.9–10.3)
Chloride: 106 mmol/L (ref 101–111)
Creatinine, Ser: 1.01 mg/dL (ref 0.61–1.24)
GFR calc Af Amer: >60 mL/min (ref >60)
GFR calc non Af Amer: >60 mL/min (ref >60)
Glucose, Bld: 100 mg/dL — ABNORMAL HIGH (ref 65–99)
Potassium: 4 mmol/L (ref 3.5–5.1)
Sodium: 137 mmol/L (ref 135–145)

## 2015-10-25 LAB — CBC
HCT: 26.2 % — ABNORMAL LOW (ref 39.0–52.0)
Hemoglobin: 7.5 g/dL — ABNORMAL LOW (ref 13.0–17.0)
MCH: 20.7 pg — ABNORMAL LOW (ref 26.0–34.0)
MCHC: 28.6 g/dL — ABNORMAL LOW (ref 30.0–36.0)
MCV: 72.2 fL — ABNORMAL LOW (ref 78.0–100.0)
Platelets: 311 10*3/uL (ref 150–400)
RBC: 3.63 MIL/uL — ABNORMAL LOW (ref 4.22–5.81)
RDW: 22.8 % — ABNORMAL HIGH (ref 11.5–15.5)
WBC: 5.7 10*3/uL (ref 4.0–10.5)

## 2015-10-25 LAB — PREPARE RBC (CROSSMATCH)

## 2015-10-25 LAB — TROPONIN I
Troponin I: <0.03 ng/mL (ref <0.031)
Troponin I: <0.03 ng/mL (ref <0.031)
Troponin I: <0.03 ng/mL (ref <0.031)

## 2015-10-25 LAB — GLUCOSE, CAPILLARY: Glucose-Capillary: 103 mg/dL — ABNORMAL HIGH (ref 65–99)

## 2015-10-25 MED ORDER — PEG-KCL-NACL-NASULF-NA ASC-C 100 G PO SOLR: 1.0000 | Freq: Once | ORAL | Status: DC

## 2015-10-25 MED ORDER — LISINOPRIL 10 MG PO TABS
10.0000 mg | ORAL_TABLET | Freq: Every day | ORAL | Status: DC
  Administered 2015-10-25 – 2015-10-26 (×2): 10 mg via ORAL
  Filled 2015-10-25 (×2): qty 1

## 2015-10-25 MED ORDER — HYDRALAZINE HCL 20 MG/ML IJ SOLN
10.0000 mg | Freq: Four times a day (QID) | INTRAMUSCULAR | Status: DC | PRN
Start: 2015-10-25 — End: 2015-10-26
  Administered 2015-10-25 (×2): 10 mg via INTRAVENOUS
  Filled 2015-10-25 (×2): qty 1

## 2015-10-25 MED ORDER — PANTOPRAZOLE SODIUM 40 MG IV SOLR
40.0000 mg | Freq: Two times a day (BID) | INTRAVENOUS | Status: DC
  Administered 2015-10-25 – 2015-10-26 (×3): 40 mg via INTRAVENOUS
  Filled 2015-10-25 (×3): qty 40

## 2015-10-25 MED ORDER — PEG-KCL-NACL-NASULF-NA ASC-C 100 G PO SOLR
1.0000 | Freq: Once | ORAL | Status: AC
  Administered 2015-10-25: 200 g via ORAL
  Filled 2015-10-25: qty 1

## 2015-10-25 MED ORDER — SODIUM CHLORIDE 0.9 % IV SOLN
Freq: Once | INTRAVENOUS | Status: AC
  Administered 2015-10-25: 13:00:00 via INTRAVENOUS

## 2015-10-25 MED ORDER — HYDRALAZINE HCL 20 MG/ML IJ SOLN: 5.0000 mg | INTRAMUSCULAR | Status: DC | PRN

## 2015-10-25 NOTE — Progress Notes (Addendum)
[facility] PROGRESS NOTE  [name] [id number] DOB: 01-02-1951 DOA: 10/24/2015 PCP: Junie Panning, NP  Length of stay: 1   Assessment/Plan: Principal Problem:   Gastrointestinal bleeding Active Problems:   HTN (hypertension)   Tobacco abuse   [name] is a 65 y.o. male sent by primary care doctor to ED for anemia. Patient very sleepy, offers very little information. He didn't sleep well last night. Wife told emergency room physician that patient had 2 units of blood transfused last week . Patient endorses black stools since last of December. He takes a full aspirin daily but denies use of any other NSAIDs. Patient gives a history of an ulcer at age 2. He endorses chronic nausea and vomiting, no coffee-ground emesis however. It has no abdominal pain. Stools are otherwise normal. Weight stable. Patient has no chest pain or shortness of breath. No fatigue.Microcytic anemia hgb 6.2 , baseline 13.3 in November  Assessment and plan Severe iron deficiency anemia. Presented with a hemoglobin of 6.2, status post 2 units of packed red blood cells, hemoglobin now 7.5 Baseline hemoglobin 13 in November. Several week history of black stool, (heme + in ED). BUN normal, hemodynamically stable suggesting slow bleed. Heavy Aleve at one time but not in months. Patient poor historian but per wife patient had 2 units of blood last week. No recent GI workup. Continue telemetry Continue Protonix drip Check PT/INR, INR abnormal in December 2016 Placed ordered to be NPO  pending GI recommendations -GI consult -  apparently requested yesterday, [place] to see today   saw Dr. Lajoyce Corners in 2007 for colonoscopy for iron deficiency anemia. Patient had a CT scan  05/30/15 that did not show any acute findings  We'll transfuse 1 more unit today, please keep hemoglobin above 8 Start oral iron supplementation  scheduled for EGD and colonoscopy with Dr. Loletha Carrow tomorrow 10/26/2015   Hx  of CVA November. MRI - Non-dominant R paramedian pontine infarct secondary to small vessel disease.  -Hold daily ASA for now  HTN. Controlled. Hold  ACE inhibitor, Hydrodiuril,  continue Imdur.  Prn hydralazine   Tobacco abuse. Desires patch -Nicoderm patch  Hx of HCV per PMH. Normal appearing liver on CTscan with contrast Nov 2016  Check  hep C RNA quantitative Normal liver function   DVT prophylaxsis SCDs  Code Status:      Code Status Orders        Start     Ordered   10/24/15 1823  Full code   Continuous     10/24/15 1822     Family Communication: Discussed in detail with the patient, all imaging results, lab results explained to the patient   Disposition Plan:   GI consultation , endoscopic eval in am       Consultation  GI   Procedures:  None   Antibiotics: Anti-infectives    None         HPI/Subjective: Upset about the clear/full liquid diet    Objective: Filed Vitals:   10/24/15 1825 10/24/15 2054 10/25/15 0449 10/25/15 0600  BP: 167/81 164/83 171/74 188/85  Pulse: 63 67 61 63  Temp: 97.7 F (36.5 C) 98.1 F (36.7 C) 98.3 F (36.8 C)   TempSrc: Oral Oral Oral   Resp: 18 18 18    Height:      Weight:      SpO2: 100% 100% 100% 100%    Intake/Output Summary (Last 24 hours) at 10/25/15 0843 Last  data filed at 10/25/15 0600  Gross per 24 hour  Intake 3856.08 ml  Output   3025 ml  Net 831.08 ml    Exam:  General: No acute respiratory distress Lungs: Clear to auscultation bilaterally without wheezes or crackles Cardiovascular: Regular rate and rhythm without murmur gallop or rub normal S1 and S2 Abdomen: Nontender, nondistended, soft, bowel sounds positive, no rebound, no ascites, no appreciable mass Extremities: No significant cyanosis, clubbing, or edema bilateral lower extremities     Data Review   Micro Results No results found for this or any previous visit (from the past 240 hour(s)).  Radiology Reports No results  found.   CBC  Recent Labs Lab 10/24/15 1155 10/24/15 2253 10/25/15 0512  WBC 5.4 5.8 5.7  HGB 6.2* 7.4* 7.5*  HCT 22.1* 25.6* 26.2*  PLT 339 304 311  MCV 70.2* 72.7* 72.2*  MCH 19.7* 21.0* 20.7*  MCHC 28.1* 28.9* 28.6*  RDW 22.1* 22.8* 22.8*    Chemistries   Recent Labs Lab 10/24/15 1155 10/25/15 0226  NA 138 137  K 4.4 4.0  CL 108 106  CO2 21* 22  GLUCOSE 97 100*  BUN 17 14  CREATININE 0.93 1.01  CALCIUM 9.0 8.9  AST 19  --   ALT 18  --   ALKPHOS 72  --   BILITOT 0.6  --    ------------------------------------------------------------------------------------------------------------------ estimated creatinine clearance is 83.4 mL/min (by C-G formula based on Cr of 1.01). ------------------------------------------------------------------------------------------------------------------ No results for input(s): HGBA1C in the last 72 hours. ------------------------------------------------------------------------------------------------------------------ No results for input(s): CHOL, HDL, LDLCALC, TRIG, CHOLHDL, LDLDIRECT in the last 72 hours. ------------------------------------------------------------------------------------------------------------------ No results for input(s): TSH, T4TOTAL, T3FREE, THYROIDAB in the last 72 hours.  Invalid input(s): FREET3 ------------------------------------------------------------------------------------------------------------------  Recent Labs  10/24/15 1334  VITAMINB12 291  FOLATE 9.3  FERRITIN 1*  TIBC 560*  IRON 23*  RETICCTPCT 1.6    Coagulation profile No results for input(s): INR, PROTIME in the last 168 hours.  No results for input(s): DDIMER in the last 72 hours.  Cardiac Enzymes No results for input(s): CKMB, TROPONINI, MYOGLOBIN in the last 168 hours.  Invalid input(s): CK ------------------------------------------------------------------------------------------------------------------ Invalid  input(s): POCBNP   CBG: No results for input(s): GLUCAP in the last 168 hours.     Studies: No results found.    Lab Results  Component Value Date   HGBA1C 5.8* 05/31/2015   Lab Results  Component Value Date   LDLCALC 40 05/31/2015   CREATININE 1.01 10/25/2015       Scheduled Meds: . cyclobenzaprine  10 mg Oral QHS  . isosorbide mononitrate  60 mg Oral Daily  . nicotine  21 mg Transdermal Daily  . [START ON 10/28/2015] pantoprazole (PROTONIX) IV  40 mg Intravenous Q12H  . zolpidem  5 mg Oral QHS   Continuous Infusions: . sodium chloride 75 mL/hr at 10/24/15 2105  . pantoprozole (PROTONIX) infusion 8 mg/hr (10/25/15 0328)    Principal Problem:   Gastrointestinal bleeding Active Problems:   HTN (hypertension)   Tobacco abuse    Time spent: 35 minutes   [facility] Pager [phone]. If 7PM-7AM, please contact night-coverage at [email or web address], password [facility] 10/25/2015, 8:43 AM  LOS: 1 day

## 2015-10-25 NOTE — Progress Notes (Signed)
BP 187/80. Pt has PRN hydralazine order which was given earlier.  Dr. Allyson Sabal paged.

## 2015-10-25 NOTE — Progress Notes (Addendum)
Patient's BP elevated at 188/85, PR 63. Given PRN pain medication an hour ago d/t pain 9/10. Pain now 2/10.  Paged floor coverage [facility] MD.  Fredirick Maudlin responded with new order for PRN Hydralazine 10 mg Inj for SBP>160 or DBP>100.  Administered PRN Hydralazine as ordered.  Endorsed to day shift RN appropriately.

## 2015-10-25 NOTE — Consult Note (Signed)
Consultation  Referring Provider: [facility] Primary Care Physician:  Smothers, Andree Elk, NP Primary Gastroenterologist:  None/unassigned  Reason for Consultation:   Recurrent anemia/heme + stool  HPI: [name] is a 65 y.o. male  admitted last evening through the emergency room after being sent by his PCP for anemia. He had a hemoglobin of 6.2 and was noted to be Hemoccult positive. This was down from a hemoglobin documented at 13 in November 2016. Patient had admission here in November 2016 with a CVA and has been maintained on aspirin 325 mg by mouth daily. Other medical problems include hypertension, history of hepatitis C, remote history of bleeding ulcers for which she underwent surgery as a teenager but does not believe they removed any portion of his stomach. Review of records show that this patient was transfused 2 units of packed RBCs as an outpatient in December 2016 and then had 2 units of packed RBCs on 10/17/2015 both ordered by his PCP. There are no interval CBCs available in this system. Vision says he has been noticing intermittent black stools over the past several months. He does not have a bowel movement daily and sometimes goes 2 or 3 days between bowel movements. Stools have been black or recently. He has not noticed any bright red blood. He denies any dysphagia or odynophagia no complaints of abdominal pain appetite has been fine weight has been stable. He has had numerous abdominal surgeries. Family history is pertinent for colon cancer in his sister. Patient apparently had colonoscopy per Dr. Lajoyce Corners in 2007 which was an incomplete exam due to poor prep but he did not have repeat exam. Patient received 2 units of packed RBCs last evening and hemoglobin is 7.5 this morning Serum iron 23 TIBC 516 iron saturation of 4   Past Medical History  Diagnosis Date  . Hyperlipidemia     not on any meds  . Hypertension     takes Amlodipine and Lisinopril daily  .  Insomnia     takes Ambien nightly  . History of blood transfusion     no abnormal reaction noted  . PONV (postoperative nausea and vomiting)   . Shortness of breath dyspnea     rarely but when notices he can be lying/sitting/exertion.Dr.Hochrein is aware per pt  . Headache     occasionally  . Numbness     both legs occasionally  . Arthritis   . Joint pain   . Joint swelling   . Chronic back pain   . History of gout     doesn't take any meds  . GERD (gastroesophageal reflux disease)     uses Baking Soda  . Hepatitis C   . Urinary frequency   . Urinary urgency   . Nocturia   . Glaucoma     right eye  . Cancer [facility])     Prostate CA  . GIB (gastrointestinal bleeding) 2012  . Ischemic colitis ([place]) 2012    Past Surgical History  Procedure Laterality Date  . Appendectomy    . Small intestine surgery    . Multiple abdominal surgeries      total of 13  . Prostatectomy    . Colonoscopy    . Esophagogastroduodenoscopy    . Inguinal hernia repair Right 11/04/2014    Procedure: RIGHT INGUINAL HERNIA REPAIR WITH MESH;  Surgeon: Jackolyn Confer, MD;  Location: [facility];  Service: General;  Laterality: Right;  . Mass excision N/A 11/04/2014  Procedure: REMOVAL OF RIGHT GROIN SOFT TISSUE MASS;  Surgeon: Jackolyn Confer, MD;  Location: [facility];  Service: General;  Laterality: N/A;    Prior to Admission medications   Medication Sig Start Date End Date Taking? Authorizing Provider  aspirin 325 MG tablet Take 1 tablet (325 mg total) by mouth daily. 06/04/15  Yes Geradine Girt, DO  cyclobenzaprine (FLEXERIL) 10 MG tablet Take 10 mg by mouth at bedtime. 10/06/15  Yes Historical Provider, MD  diclofenac sodium (VOLTAREN) 1 % GEL Apply 2 g topically 3 (three) times daily as needed (Severe knee pain). 06/04/15  Yes Geradine Girt, DO  hydrochlorothiazide (HYDRODIURIL) 25 MG tablet Take 1 tablet (25 mg total) by mouth daily. 06/04/15  Yes Geradine Girt, DO  HYDROcodone-acetaminophen  (NORCO/VICODIN) 5-325 MG tablet Take 1 tablet by mouth every 6 (six) hours as needed for severe pain. 06/04/15  Yes Geradine Girt, DO  isosorbide mononitrate (IMDUR) 60 MG 24 hr tablet Take 1 tablet (60 mg total) by mouth daily. 06/04/15  Yes Geradine Girt, DO  lisinopril (PRINIVIL,ZESTRIL) 10 MG tablet Take 10 mg by mouth daily. 10/06/15  Yes Historical Provider, MD  LORazepam (ATIVAN) 0.5 MG tablet Take 1 tablet (0.5 mg total) by mouth every 8 (eight) hours as needed for anxiety. 06/04/15  Yes Jessica U Vann, DO  senna-docusate (SENOKOT-S) 8.6-50 MG tablet Take 1 tablet by mouth at bedtime as needed for moderate constipation. 06/04/15  Yes Geradine Girt, DO  zolpidem (AMBIEN) 10 MG tablet Take 10 mg by mouth at bedtime. 10/10/15  Yes Historical Provider, MD  nicotine (NICODERM CQ - DOSED IN MG/24 HOURS) 14 mg/24hr patch Place 1 patch (14 mg total) onto the skin daily. 06/04/15   Geradine Girt, DO    Current Facility-Administered Medications  Medication Dose Route Frequency Provider Last Rate Last Dose  . 0.9 %  sodium chloride infusion   Intravenous Continuous Willia Craze, NP 75 mL/hr at 10/24/15 2105    . acetaminophen (TYLENOL) tablet 650 mg  650 mg Oral Q6H PRN Willia Craze, NP       Or  . acetaminophen (TYLENOL) suppository 650 mg  650 mg Rectal Q6H PRN Willia Craze, NP      . bisacodyl (DULCOLAX) EC tablet 5 mg  5 mg Oral Daily PRN Willia Craze, NP      . cyclobenzaprine (FLEXERIL) tablet 10 mg  10 mg Oral QHS Willia Craze, NP      . hydrALAZINE (APRESOLINE) injection 10 mg  10 mg Intravenous Q6H PRN Dianne Dun, NP   10 mg at 10/25/15 0653  . HYDROcodone-acetaminophen (NORCO/VICODIN) 5-325 MG per tablet 1 tablet  1 tablet Oral Q6H PRN Willia Craze, NP   1 tablet at 10/25/15 0453  . isosorbide mononitrate (IMDUR) 24 hr tablet 60 mg  60 mg Oral Daily Willia Craze, NP      . LORazepam (ATIVAN) tablet 0.5 mg  0.5 mg Oral Q8H PRN Willia Craze, NP       . nicotine (NICODERM CQ - dosed in mg/24 hours) patch 21 mg  21 mg Transdermal Daily Willia Craze, NP   21 mg at 10/24/15 2105  . ondansetron (ZOFRAN) tablet 4 mg  4 mg Oral Q6H PRN Willia Craze, NP       Or  . ondansetron [facility]) injection 4 mg  4 mg Intravenous Q6H PRN Willia Craze, NP      .  pantoprazole (PROTONIX) 80 mg in sodium chloride 0.9 % 250 mL (0.32 mg/mL) infusion  8 mg/hr Intravenous Continuous Willia Craze, NP 25 mL/hr at 10/25/15 0328 8 mg/hr at 10/25/15 0328  . [START ON 10/28/2015] pantoprazole (PROTONIX) injection 40 mg  40 mg Intravenous Q12H Willia Craze, NP      . zolpidem [facility]) tablet 5 mg  5 mg Oral QHS Willia Craze, NP   5 mg at 10/24/15 2225    Allergies as of 10/24/2015 - Review Complete 10/24/2015  Allergen Reaction Noted  . Penicillins Anaphylaxis 03/02/2011  . Sudafed [pseudoephedrine] Other (See Comments) 11/04/2014    Family History  Problem Relation Age of Onset  . Alzheimer's disease Father   . Cancer Mother     Lymph node  . Heart failure Brother 45    Transplant 13 years ago  . CAD Brother   . Heart disease Sister 41    MI    Social History   Social History  . Marital Status: Legally Separated    Spouse Name: N/A  . Number of Children: 2  . Years of Education: N/A   Occupational History  . Not on file.   Social History Main Topics  . Smoking status: Current Every Day Smoker -- 0.50 packs/day for 50 years    Types: Cigarettes  . Smokeless tobacco: Never Used  . Alcohol Use: 0.0 oz/week     Comment: occasionally  . Drug Use: No  . Sexual Activity: Yes   Other Topics Concern  . Not on file   Social History Narrative   One living child .  One murdered.  Lives with girlfriend.      Review of Systems: Pertinent positive and negative review of systems were noted in the above HPI section.  All other review of systems was otherwise negative. Physical Exam: Vital signs in last 24 hours: Temp:  [97.7 F  (36.5 C)-98.6 F (37 C)] 98.3 F (36.8 C) (04/08 0449) Pulse Rate:  [61-72] 63 (04/08 0600) Resp:  [14-26] 18 (04/08 0449) BP: (125-188)/(64-85) 188/85 mmHg (04/08 0600) SpO2:  [99 %-100 %] 100 % (04/08 0600) Weight:  [214 lb (97.07 kg)] 214 lb (97.07 kg) (04/07 1150) Last BM Date: 10/23/15 General:   Alert,  Well-developed,AA male  well-nourished, pleasant and cooperative in NAD Head:  Normocephalic and atraumatic. Eyes:  Sclera clear, no icterus.   Conjunctiva pink. Ears:  Normal auditory acuity. Nose:  No deformity, discharge,  or lesions. Mouth:  No deformity or lesions.   Neck:  Supple; no masses or thyromegaly. Lungs:  Clear throughout to auscultation.   No wheezes, crackles, or rhonchi. Heart:  Regular rate and rhythm; no murmurs, clicks, rubs,  or gallops. Abdomen:  Soft,nontender, BS active,nonpalp mass or hsm.multiple incisional scars   Rectal:  Deferred , documented heme + Msk:  Symmetrical without gross deformities. . Pulses:  Normal pulses noted. Extremities:  Without clubbing or edema. Neurologic:  Alert and  oriented x4;  Left UE and LLE weakness. Skin:  Intact without significant lesions or rashes.. Psych:  Alert and cooperative. Normal mood and affect.  Intake/Output from previous day: 04/07 0701 - 04/08 0700 In: 3856.1 [P.O.:960; I.V.:1582.1; Blood:1314] Out: 3025 [Urine:3025] Intake/Output this shift: Total I/O In: 600 [P.O.:600] Out: 500 [Urine:500]  Lab Results:  Recent Labs  10/24/15 1155 10/24/15 2253 10/25/15 0512  WBC 5.4 5.8 5.7  HGB 6.2* 7.4* 7.5*  HCT 22.1* 25.6* 26.2*  PLT 339 304 311   BMET  Recent Labs  10/24/15 1155 10/25/15 0226  NA 138 137  K 4.4 4.0  CL 108 106  CO2 21* 22  GLUCOSE 97 100*  BUN 17 14  CREATININE 0.93 1.01  CALCIUM 9.0 8.9   LFT  Recent Labs  10/24/15 1155  PROT 6.2*  ALBUMIN 3.5  AST 19  ALT 18  ALKPHOS 72  BILITOT 0.6   PT/INR No results for input(s): LABPROT, INR in the last 72  hours. Hepatitis Panel No results for input(s): HEPBSAG, HCVAB, HEPAIGM, HEPBIGM in the last 72 hours.   IMPRESSION:   #43 65 year old African-American male with severe iron deficiency anemia, Hemoccult-positive stool and reports of intermittent melena over the past several months. Patient has had 4 units of packed RBCs transfused as an outpatient over the past 4 months and presented yesterday with hemoglobin of 6.2. Etiology of blood loss is not clear we'll need to rule out colon cancer, AVMs, occult gastric lesion #2 history of CVA November 2016- on aspirin #3 hypertension #4 status post multiple abdominal surgeries #5 remote history of GI bleed secondary to ulcer disease as a teenager he had surgery, type unclear   Plan Liquid diet today, nothing by mouth after midnight We'll transfuse 1 more unit today, please keep hemoglobin above 8 Start oral iron supplementation Have scheduled for EGD and colonoscopy with Dr. Loletha Carrow tomorrow 10/26/2015. Procedures discussed in detail with the patient and he is agreeable to proceed IV PPI as doing      Amy Esterwood  10/25/2015, 9:39 AM   I have reviewed the entire case in detail with the above APP and discussed the plan in detail.  Therefore, I agree with the diagnoses recorded above. In addition,  I have personally interviewed and examined the patient and have personally reviewed any abdominal/pelvic CT scan images.  My additional thoughts are as follows:  IDA with recent melena by description.  Marked anemia not worked up by his PCP.  He is stable at this time. Sister had CRC in her 60's.  EGD/colonoscopy tomorrow.    Nelida Meuse III Pager [phone]  Mon-Fri 8a-5p [phone] after 5p, weekends, holidays

## 2015-10-26 ENCOUNTER — Encounter (HOSPITAL_COMMUNITY): Payer: Self-pay | Admitting: *Deleted

## 2015-10-26 ENCOUNTER — Encounter (HOSPITAL_COMMUNITY)
Admission: EM | Disposition: A | Discharge status: Home / Self Care | Payer: Self-pay | Source: Home / Self Care | Attending: Internal Medicine

## 2015-10-26 DIAGNOSIS — D509 Iron deficiency anemia, unspecified: Secondary | ICD-10-CM | POA: Insufficient documentation

## 2015-10-26 HISTORY — PX: COLONOSCOPY: SHX5424

## 2015-10-26 HISTORY — PX: ESOPHAGOGASTRODUODENOSCOPY: SHX5428

## 2015-10-26 LAB — TYPE AND SCREEN
ABO/RH(D): O POS
Antibody Screen: NEGATIVE
Unit division: 0
Unit division: 0
Unit division: 0

## 2015-10-26 LAB — COMPREHENSIVE METABOLIC PANEL
ALT: 29 U/L (ref 17–63)
AST: 32 U/L (ref 15–41)
Albumin: 3.9 g/dL (ref 3.5–5.0)
Alkaline Phosphatase: 104 U/L (ref 38–126)
Anion gap: 11 (ref 5–15)
BUN: 9 mg/dL (ref 6–20)
CO2: 23 mmol/L (ref 22–32)
Calcium: 9.6 mg/dL (ref 8.9–10.3)
Chloride: 110 mmol/L (ref 101–111)
Creatinine, Ser: 1.07 mg/dL (ref 0.61–1.24)
GFR calc Af Amer: >60 mL/min (ref >60)
GFR calc non Af Amer: >60 mL/min (ref >60)
Glucose, Bld: 89 mg/dL (ref 65–99)
Potassium: 4.2 mmol/L (ref 3.5–5.1)
Sodium: 144 mmol/L (ref 135–145)
Total Bilirubin: 1.1 mg/dL (ref 0.3–1.2)
Total Protein: 7.5 g/dL (ref 6.5–8.1)

## 2015-10-26 LAB — HCV RNA QUANT
HCV Quantitative Log: 5.645 log10 IU/mL (ref >1.70)
HCV Quantitative: 442000 IU/mL (ref >50)

## 2015-10-26 LAB — CBC
HCT: 32.5 % — ABNORMAL LOW (ref 39.0–52.0)
Hemoglobin: 9.4 g/dL — ABNORMAL LOW (ref 13.0–17.0)
MCH: 21.2 pg — ABNORMAL LOW (ref 26.0–34.0)
MCHC: 28.9 g/dL — ABNORMAL LOW (ref 30.0–36.0)
MCV: 73.4 fL — ABNORMAL LOW (ref 78.0–100.0)
Platelets: 356 10*3/uL (ref 150–400)
RBC: 4.43 MIL/uL (ref 4.22–5.81)
RDW: 23.2 % — ABNORMAL HIGH (ref 11.5–15.5)
WBC: 7.1 10*3/uL (ref 4.0–10.5)

## 2015-10-26 LAB — PROTIME-INR
INR: 1.37 (ref 0.00–1.49)
Prothrombin Time: 17 seconds — ABNORMAL HIGH (ref 11.6–15.2)

## 2015-10-26 SURGERY — EGD (ESOPHAGOGASTRODUODENOSCOPY)
Anesthesia: Moderate Sedation

## 2015-10-26 MED ORDER — SODIUM CHLORIDE 0.9 % IV SOLN
125.0000 mg | Freq: Once | INTRAVENOUS | Status: AC
  Administered 2015-10-26: 125 mg via INTRAVENOUS
  Filled 2015-10-26: qty 10

## 2015-10-26 MED ORDER — FERROUS SULFATE 325 (65 FE) MG PO TABS: 325.0000 mg | ORAL_TABLET | Freq: Every day | ORAL | Status: DC

## 2015-10-26 MED ORDER — FENTANYL CITRATE (PF) 100 MCG/2ML IJ SOLN
INTRAMUSCULAR | Status: DC | PRN
  Administered 2015-10-26 (×3): 25 ug via INTRAVENOUS

## 2015-10-26 MED ORDER — SODIUM CHLORIDE 0.9 % IV SOLN
25.0000 mg | Freq: Once | INTRAVENOUS | Status: AC
  Administered 2015-10-26: 25 mg via INTRAVENOUS
  Filled 2015-10-26: qty 2

## 2015-10-26 MED ORDER — MIDAZOLAM HCL 10 MG/2ML IJ SOLN
INTRAMUSCULAR | Status: DC | PRN
  Administered 2015-10-26 (×2): 1 mg via INTRAVENOUS
  Administered 2015-10-26: 2 mg via INTRAVENOUS
  Administered 2015-10-26: 1 mg via INTRAVENOUS

## 2015-10-26 MED ORDER — DIPHENHYDRAMINE HCL 50 MG/ML IJ SOLN
INTRAMUSCULAR | Status: AC
  Filled 2015-10-26: qty 1

## 2015-10-26 MED ORDER — FENTANYL CITRATE (PF) 100 MCG/2ML IJ SOLN
INTRAMUSCULAR | Status: AC
  Filled 2015-10-26: qty 4

## 2015-10-26 MED ORDER — MIDAZOLAM HCL 5 MG/ML IJ SOLN
INTRAMUSCULAR | Status: AC
  Filled 2015-10-26: qty 2

## 2015-10-26 MED ORDER — MIDAZOLAM HCL 10 MG/2ML IJ SOLN
INTRAMUSCULAR | Status: DC | PRN
  Administered 2015-10-26: 1 mg via INTRAVENOUS

## 2015-10-26 NOTE — Discharge Summary (Signed)
Physician Discharge Summary  [name] [id number] DOB: 1951-05-23 DOA: 10/24/2015  PCP: Junie Panning, NP  Admit date: 10/24/2015 Discharge date: 10/26/2015  Time spent: greater than 30 minutes  Recommendations for Outpatient Follow-up:  1. Monitor h/h 2. Per GI, hold ASA for 2 weeks until follow up h/h, then resume 3. No NSAIDs   Discharge Diagnoses:  Principal Problem:   Gastrointestinal bleeding Active Problems:   HTN (hypertension)   Tobacco abuse   Anemia, iron deficiency   Discharge Condition: stable  Diet recommendation: heart healthy  Filed Weights   10/24/15 1150  Weight: 97.07 kg (214 lb)    History of present illness/Hospital Course:  65 y.o. male sent by primary care doctor to ED for anemia. Patient very sleepy, offers very little information. He didn't sleep well last night. Wife told emergency room physician that patient had 2 units of blood transfused last week . Patient endorses black stools since last of December. He takes a full aspirin daily but denies use of any other NSAIDs. Patient gives a history of an ulcer at age 70. He endorses chronic nausea and vomiting, no coffee-ground emesis however. It has no abdominal pain. Stools are otherwise normal. Weight stable. Patient has no chest pain or shortness of breath. No fatigue.Microcytic anemia hgb 6.2 , baseline 13.3 in November  Anemia, heme positive stool. Presented with a hemoglobin of 6.2, status post 3 units of packed red blood cells, hemoglobin 9.4 at discharge Heavy Aleve at one time but not in months.  Started on Protonix drip ASA held. GI consulted. Colonoscopy showed internal hemorrhoids, otherwise normal. EGD showed medium sized hiatal hernia, otherwise unremarkable. GI recommends holding ASA for 2 weeks, and f/u in the office. I Recommend resuming if no further bleeding, given h/o CVA  Hx of CVA November. MRI - Non-dominant R paramedian pontine infarct secondary to small vessel disease.    Hx of HCV per PMH. Normal appearing liver on CTscan with contrast Nov 2016  No varices on EGD  Procedures:  EGD, colonoscopy  Consultations:  [facility]  Discharge Exam: Filed Vitals:   10/26/15 1410 10/26/15 1434  BP: 173/91   Pulse: 64 68  Temp:  97.7 F (36.5 C)  Resp: 15 16   See progress note  Discharge Instructions   Discharge Instructions    Diet - low sodium heart healthy    Complete by:  As directed      Discharge instructions    Complete by:  As directed   Hold aspirin for 2 weeks, then resume if ok with Dr. Pincus Sanes     Increase activity slowly    Complete by:  As directed           Current Discharge Medication List    START taking these medications   Details  ferrous sulfate 325 (65 FE) MG tablet Take 1 tablet (325 mg total) by mouth daily with breakfast. Qty: 30 tablet, Refills: 1      CONTINUE these medications which have NOT CHANGED   Details  cyclobenzaprine (FLEXERIL) 10 MG tablet Take 10 mg by mouth at bedtime. Refills: 1    diclofenac sodium (VOLTAREN) 1 % GEL Apply 2 g topically 3 (three) times daily as needed (Severe knee pain). Qty: 100 g, Refills: 0    hydrochlorothiazide (HYDRODIURIL) 25 MG tablet Take 1 tablet (25 mg total) by mouth daily. Qty: 30 tablet, Refills: 0    HYDROcodone-acetaminophen (NORCO/VICODIN) 5-325 MG tablet Take 1 tablet by mouth every 6 (  six) hours as needed for severe pain. Qty: 15 tablet, Refills: 0    isosorbide mononitrate (IMDUR) 60 MG 24 hr tablet Take 1 tablet (60 mg total) by mouth daily. Qty: 30 tablet, Refills: 0    lisinopril (PRINIVIL,ZESTRIL) 10 MG tablet Take 10 mg by mouth daily. Refills: 3    LORazepam (ATIVAN) 0.5 MG tablet Take 1 tablet (0.5 mg total) by mouth every 8 (eight) hours as needed for anxiety. Qty: 10 tablet, Refills: 0    senna-docusate (SENOKOT-S) 8.6-50 MG tablet Take 1 tablet by mouth at bedtime as needed for moderate constipation.    zolpidem (AMBIEN) 10 MG tablet  Take 10 mg by mouth at bedtime. Refills: 3    nicotine (NICODERM CQ - DOSED IN MG/24 HOURS) 14 mg/24hr patch Place 1 patch (14 mg total) onto the skin daily. Qty: 28 patch, Refills: 0      STOP taking these medications     aspirin 325 MG tablet        Allergies  Allergen Reactions  . Penicillins Anaphylaxis  . Sudafed [Pseudoephedrine] Other (See Comments)    Heart "races"   Follow-up Information    Follow up with Nelida Meuse III, MD In 2 weeks.   Specialty:  Gastroenterology   Contact information:   [address] [place] [address] [phone]        The results of significant diagnostics from this hospitalization (including imaging, microbiology, ancillary and laboratory) are listed below for reference.    Significant Diagnostic Studies: No results found.  Microbiology: No results found for this or any previous visit (from the past 240 hour(s)).   Labs: Basic Metabolic Panel:  Recent Labs Lab 10/24/15 1155 10/25/15 0226 10/26/15 0638  NA 138 137 144  K 4.4 4.0 4.2  CL 108 106 110  CO2 21* 22 23  GLUCOSE 97 100* 89  BUN 17 14 9   CREATININE 0.93 1.01 1.07  CALCIUM 9.0 8.9 9.6   Liver Function Tests:  Recent Labs Lab 10/24/15 1155 10/26/15 0638  AST 19 32  ALT 18 29  ALKPHOS 72 104  BILITOT 0.6 1.1  PROT 6.2* 7.5  ALBUMIN 3.5 3.9   No results for input(s): LIPASE, AMYLASE in the last 168 hours. No results for input(s): AMMONIA in the last 168 hours. CBC:  Recent Labs Lab 10/24/15 1155 10/24/15 2253 10/25/15 0512 10/26/15 0638  WBC 5.4 5.8 5.7 7.1  HGB 6.2* 7.4* 7.5* 9.4*  HCT 22.1* 25.6* 26.2* 32.5*  MCV 70.2* 72.7* 72.2* 73.4*  PLT 339 304 311 356   Cardiac Enzymes:  Recent Labs Lab 10/25/15 0853 10/25/15 1504 10/25/15 2015  TROPONINI <0.03 <0.03 <0.03   BNP: BNP (last 3 results) No results for input(s): BNP in the last 8760 hours.  ProBNP (last 3 results) No results for input(s): PROBNP in the last 8760  hours.  CBG:  Recent Labs Lab 10/25/15 1712  GLUCAP 103*       Signed:  Delfina Redwood MD [facility] 10/26/2015, 3:29 PM

## 2015-10-26 NOTE — Interval H&P Note (Signed)
History and Physical Interval Note:  10/26/2015 1:05 PM  [name]  has presented today for surgery, with the diagnosis of fe deficiency anemia  The various methods of treatment have been discussed with the patient and family. After consideration of risks, benefits and other options for treatment, the patient has consented to  Procedure(s): ESOPHAGOGASTRODUODENOSCOPY (EGD) (N/A) COLONOSCOPY (N/A) as a surgical intervention .  The patient's history has been reviewed, patient examined, no change in status, stable for surgery.  I have reviewed the patient's chart and labs.  Questions were answered to the patient's satisfaction.     Nelida Meuse III

## 2015-10-26 NOTE — H&P (View-Only) (Signed)
Consultation  Referring Provider: [facility] Primary Care Physician:  Smothers, Andree Elk, NP Primary Gastroenterologist:  None/unassigned  Reason for Consultation:   Recurrent anemia/heme + stool  HPI: [name] is a 65 y.o. male  admitted last evening through the emergency room after being sent by his PCP for anemia. He had a hemoglobin of 6.2 and was noted to be Hemoccult positive. This was down from a hemoglobin documented at 13 in November 2016. Patient had admission here in November 2016 with a CVA and has been maintained on aspirin 325 mg by mouth daily. Other medical problems include hypertension, history of hepatitis C, remote history of bleeding ulcers for which she underwent surgery as a teenager but does not believe they removed any portion of his stomach. Review of records show that this patient was transfused 2 units of packed RBCs as an outpatient in December 2016 and then had 2 units of packed RBCs on 10/17/2015 both ordered by his PCP. There are no interval CBCs available in this system. Vision says he has been noticing intermittent black stools over the past several months. He does not have a bowel movement daily and sometimes goes 2 or 3 days between bowel movements. Stools have been black or recently. He has not noticed any bright red blood. He denies any dysphagia or odynophagia no complaints of abdominal pain appetite has been fine weight has been stable. He has had numerous abdominal surgeries. Family history is pertinent for colon cancer in his sister. Patient apparently had colonoscopy per Dr. Lajoyce Corners in 2007 which was an incomplete exam due to poor prep but he did not have repeat exam. Patient received 2 units of packed RBCs last evening and hemoglobin is 7.5 this morning Serum iron 23 TIBC 516 iron saturation of 4   Past Medical History  Diagnosis Date  . Hyperlipidemia     not on any meds  . Hypertension     takes Amlodipine and Lisinopril daily  .  Insomnia     takes Ambien nightly  . History of blood transfusion     no abnormal reaction noted  . PONV (postoperative nausea and vomiting)   . Shortness of breath dyspnea     rarely but when notices he can be lying/sitting/exertion.Dr.Hochrein is aware per pt  . Headache     occasionally  . Numbness     both legs occasionally  . Arthritis   . Joint pain   . Joint swelling   . Chronic back pain   . History of gout     doesn't take any meds  . GERD (gastroesophageal reflux disease)     uses Baking Soda  . Hepatitis C   . Urinary frequency   . Urinary urgency   . Nocturia   . Glaucoma     right eye  . Cancer [facility])     Prostate CA  . GIB (gastrointestinal bleeding) 2012  . Ischemic colitis ([place]) 2012    Past Surgical History  Procedure Laterality Date  . Appendectomy    . Small intestine surgery    . Multiple abdominal surgeries      total of 13  . Prostatectomy    . Colonoscopy    . Esophagogastroduodenoscopy    . Inguinal hernia repair Right 11/04/2014    Procedure: RIGHT INGUINAL HERNIA REPAIR WITH MESH;  Surgeon: Jackolyn Confer, MD;  Location: [facility];  Service: General;  Laterality: Right;  . Mass excision N/A 11/04/2014  Procedure: REMOVAL OF RIGHT GROIN SOFT TISSUE MASS;  Surgeon: Jackolyn Confer, MD;  Location: [place];  Service: General;  Laterality: N/A;    Prior to Admission medications   Medication Sig Start Date End Date Taking? Authorizing Provider  aspirin 325 MG tablet Take 1 tablet (325 mg total) by mouth daily. 06/04/15  Yes Geradine Girt, DO  cyclobenzaprine (FLEXERIL) 10 MG tablet Take 10 mg by mouth at bedtime. 10/06/15  Yes Historical Provider, MD  diclofenac sodium (VOLTAREN) 1 % GEL Apply 2 g topically 3 (three) times daily as needed (Severe knee pain). 06/04/15  Yes Geradine Girt, DO  hydrochlorothiazide (HYDRODIURIL) 25 MG tablet Take 1 tablet (25 mg total) by mouth daily. 06/04/15  Yes Geradine Girt, DO  HYDROcodone-acetaminophen  (NORCO/VICODIN) 5-325 MG tablet Take 1 tablet by mouth every 6 (six) hours as needed for severe pain. 06/04/15  Yes Geradine Girt, DO  isosorbide mononitrate (IMDUR) 60 MG 24 hr tablet Take 1 tablet (60 mg total) by mouth daily. 06/04/15  Yes Geradine Girt, DO  lisinopril (PRINIVIL,ZESTRIL) 10 MG tablet Take 10 mg by mouth daily. 10/06/15  Yes Historical Provider, MD  LORazepam (ATIVAN) 0.5 MG tablet Take 1 tablet (0.5 mg total) by mouth every 8 (eight) hours as needed for anxiety. 06/04/15  Yes Jessica U Vann, DO  senna-docusate (SENOKOT-S) 8.6-50 MG tablet Take 1 tablet by mouth at bedtime as needed for moderate constipation. 06/04/15  Yes Geradine Girt, DO  zolpidem (AMBIEN) 10 MG tablet Take 10 mg by mouth at bedtime. 10/10/15  Yes Historical Provider, MD  nicotine (NICODERM CQ - DOSED IN MG/24 HOURS) 14 mg/24hr patch Place 1 patch (14 mg total) onto the skin daily. 06/04/15   Geradine Girt, DO    Current Facility-Administered Medications  Medication Dose Route Frequency Provider Last Rate Last Dose  . 0.9 %  sodium chloride infusion   Intravenous Continuous Willia Craze, NP 75 mL/hr at 10/24/15 2105    . acetaminophen (TYLENOL) tablet 650 mg  650 mg Oral Q6H PRN Willia Craze, NP       Or  . acetaminophen (TYLENOL) suppository 650 mg  650 mg Rectal Q6H PRN Willia Craze, NP      . bisacodyl (DULCOLAX) EC tablet 5 mg  5 mg Oral Daily PRN Willia Craze, NP      . cyclobenzaprine (FLEXERIL) tablet 10 mg  10 mg Oral QHS Willia Craze, NP      . hydrALAZINE (APRESOLINE) injection 10 mg  10 mg Intravenous Q6H PRN Dianne Dun, NP   10 mg at 10/25/15 0653  . HYDROcodone-acetaminophen (NORCO/VICODIN) 5-325 MG per tablet 1 tablet  1 tablet Oral Q6H PRN Willia Craze, NP   1 tablet at 10/25/15 0453  . isosorbide mononitrate (IMDUR) 24 hr tablet 60 mg  60 mg Oral Daily Willia Craze, NP      . LORazepam (ATIVAN) tablet 0.5 mg  0.5 mg Oral Q8H PRN Willia Craze, NP       . nicotine (NICODERM CQ - dosed in mg/24 hours) patch 21 mg  21 mg Transdermal Daily Willia Craze, NP   21 mg at 10/24/15 2105  . ondansetron (ZOFRAN) tablet 4 mg  4 mg Oral Q6H PRN Willia Craze, NP       Or  . ondansetron [facility]) injection 4 mg  4 mg Intravenous Q6H PRN Willia Craze, NP      .  pantoprazole (PROTONIX) 80 mg in sodium chloride 0.9 % 250 mL (0.32 mg/mL) infusion  8 mg/hr Intravenous Continuous Willia Craze, NP 25 mL/hr at 10/25/15 0328 8 mg/hr at 10/25/15 0328  . [START ON 10/28/2015] pantoprazole (PROTONIX) injection 40 mg  40 mg Intravenous Q12H Willia Craze, NP      . zolpidem [facility]) tablet 5 mg  5 mg Oral QHS Willia Craze, NP   5 mg at 10/24/15 2225    Allergies as of 10/24/2015 - Review Complete 10/24/2015  Allergen Reaction Noted  . Penicillins Anaphylaxis 03/02/2011  . Sudafed [pseudoephedrine] Other (See Comments) 11/04/2014    Family History  Problem Relation Age of Onset  . Alzheimer's disease Father   . Cancer Mother     Lymph node  . Heart failure Brother 45    Transplant 13 years ago  . CAD Brother   . Heart disease Sister 33    MI    Social History   Social History  . Marital Status: Legally Separated    Spouse Name: N/A  . Number of Children: 2  . Years of Education: N/A   Occupational History  . Not on file.   Social History Main Topics  . Smoking status: Current Every Day Smoker -- 0.50 packs/day for 50 years    Types: Cigarettes  . Smokeless tobacco: Never Used  . Alcohol Use: 0.0 oz/week     Comment: occasionally  . Drug Use: No  . Sexual Activity: Yes   Other Topics Concern  . Not on file   Social History Narrative   One living child .  One murdered.  Lives with girlfriend.      Review of Systems: Pertinent positive and negative review of systems were noted in the above HPI section.  All other review of systems was otherwise negative. Physical Exam: Vital signs in last 24 hours: Temp:  [97.7 F  (36.5 C)-98.6 F (37 C)] 98.3 F (36.8 C) (04/08 0449) Pulse Rate:  [61-72] 63 (04/08 0600) Resp:  [14-26] 18 (04/08 0449) BP: (125-188)/(64-85) 188/85 mmHg (04/08 0600) SpO2:  [99 %-100 %] 100 % (04/08 0600) Weight:  [214 lb (97.07 kg)] 214 lb (97.07 kg) (04/07 1150) Last BM Date: 10/23/15 General:   Alert,  Well-developed,AA male  well-nourished, pleasant and cooperative in NAD Head:  Normocephalic and atraumatic. Eyes:  Sclera clear, no icterus.   Conjunctiva pink. Ears:  Normal auditory acuity. Nose:  No deformity, discharge,  or lesions. Mouth:  No deformity or lesions.   Neck:  Supple; no masses or thyromegaly. Lungs:  Clear throughout to auscultation.   No wheezes, crackles, or rhonchi. Heart:  Regular rate and rhythm; no murmurs, clicks, rubs,  or gallops. Abdomen:  Soft,nontender, BS active,nonpalp mass or hsm.multiple incisional scars   Rectal:  Deferred , documented heme + Msk:  Symmetrical without gross deformities. . Pulses:  Normal pulses noted. Extremities:  Without clubbing or edema. Neurologic:  Alert and  oriented x4;  Left UE and LLE weakness. Skin:  Intact without significant lesions or rashes.. Psych:  Alert and cooperative. Normal mood and affect.  Intake/Output from previous day: 04/07 0701 - 04/08 0700 In: 3856.1 [P.O.:960; I.V.:1582.1; Blood:1314] Out: 3025 [Urine:3025] Intake/Output this shift: Total I/O In: 600 [P.O.:600] Out: 500 [Urine:500]  Lab Results:  Recent Labs  10/24/15 1155 10/24/15 2253 10/25/15 0512  WBC 5.4 5.8 5.7  HGB 6.2* 7.4* 7.5*  HCT 22.1* 25.6* 26.2*  PLT 339 304 311   BMET  Recent Labs  10/24/15 1155 10/25/15 0226  NA 138 137  K 4.4 4.0  CL 108 106  CO2 21* 22  GLUCOSE 97 100*  BUN 17 14  CREATININE 0.93 1.01  CALCIUM 9.0 8.9   LFT  Recent Labs  10/24/15 1155  PROT 6.2*  ALBUMIN 3.5  AST 19  ALT 18  ALKPHOS 72  BILITOT 0.6   PT/INR No results for input(s): LABPROT, INR in the last 72  hours. Hepatitis Panel No results for input(s): HEPBSAG, HCVAB, HEPAIGM, HEPBIGM in the last 72 hours.   IMPRESSION:   #51 65 year old African-American male with severe iron deficiency anemia, Hemoccult-positive stool and reports of intermittent melena over the past several months. Patient has had 4 units of packed RBCs transfused as an outpatient over the past 4 months and presented yesterday with hemoglobin of 6.2. Etiology of blood loss is not clear we'll need to rule out colon cancer, AVMs, occult gastric lesion #2 history of CVA November 2016- on aspirin #3 hypertension #4 status post multiple abdominal surgeries #5 remote history of GI bleed secondary to ulcer disease as a teenager he had surgery, type unclear   Plan Liquid diet today, nothing by mouth after midnight We'll transfuse 1 more unit today, please keep hemoglobin above 8 Start oral iron supplementation Have scheduled for EGD and colonoscopy with Dr. Loletha Carrow tomorrow 10/26/2015. Procedures discussed in detail with the patient and he is agreeable to proceed IV PPI as doing      Amy Esterwood  10/25/2015, 9:39 AM   I have reviewed the entire case in detail with the above APP and discussed the plan in detail.  Therefore, I agree with the diagnoses recorded above. In addition,  I have personally interviewed and examined the patient and have personally reviewed any abdominal/pelvic CT scan images.  My additional thoughts are as follows:  IDA with recent melena by description.  Marked anemia not worked up by his PCP.  He is stable at this time. Sister had CRC in her 10's.  EGD/colonoscopy tomorrow.    Nelida Meuse III Pager [phone]  Mon-Fri 8a-5p [phone] after 5p, weekends, holidays

## 2015-10-26 NOTE — Op Note (Signed)
[facility] Patient Name: [name] Procedure Date : 10/26/2015 MRN: [id number] Attending MD: Estill Cotta. Danis MD, MD Date of Birth: Jun 06, 1951 CSN: [id number] Age: 65 Admit Type: Inpatient Procedure:                Upper GI endoscopy Indications:              Iron deficiency anemia, Heme positive stool Providers:                Mallie Mussel L. Danis MD, MD, Kingsley Plan, RN, Cherylynn Ridges, Technician Referring MD:              Medicines:                Midazolam 3 mg IV, Fentanyl 25 micrograms IV,                            Cetacaine spray Complications:            No immediate complications. Estimated Blood Loss:     Estimated blood loss: none. Procedure:                Pre-Anesthesia Assessment:                           - Prior to the procedure, a History and Physical                            was performed, and patient medications and                            allergies were reviewed. The patient's tolerance of                            previous anesthesia was also reviewed. The risks                            and benefits of the procedure and the sedation                            options and risks were discussed with the patient.                            All questions were answered, and informed consent                            was obtained. Prior Anticoagulants: The patient has                            taken no previous anticoagulant or antiplatelet                            agents. ASA Grade Assessment: II - A patient with  mild systemic disease. After reviewing the risks                            and benefits, the patient was deemed in                            satisfactory condition to undergo the procedure.                           After obtaining informed consent, the endoscope was                            passed under direct vision. Throughout the                            procedure, the  patient's blood pressure, pulse, and                            oxygen saturations were monitored continuously. The                            [id number]) scope was introduced through the                            mouth, and advanced to the second part of duodenum.                            The upper GI endoscopy was accomplished without                            difficulty. The patient tolerated the procedure                            well. Scope In: Scope Out: Findings:      The esophagus was normal.      A medium-sized hiatal hernia was present. The hiatus was at 45 cm       fromthe teeth, and the EGJ was at 39cm. no Cameron erosions were seen       within the hernia.      The examined duodenum was normal. Impression:               - Normal esophagus.                           - Medium-sized hiatal hernia.                           - Normal examined duodenum.                           - No specimens collected. Moderate Sedation:      Moderate (conscious) sedation was administered by the endoscopy nurse       and supervised by the endoscopist. The following parameters were       monitored: oxygen saturation, heart rate, blood pressure, respiratory  rate, EKG, adequacy of pulmonary ventilation, and response to care. Recommendation:           - see colonoscopy report and recommendations                           - Resume regular diet.                           - No aspirin, ibuprofen, naproxen, or other                            non-steroidal anti-inflammatory drugs. Procedure Code(s):        --- Professional ---                           [phone], Esophagogastroduodenoscopy, flexible,                            transoral; diagnostic, including collection of                            specimen(s) by brushing or washing, when performed                            (separate procedure) Diagnosis Code(s):        --- Professional ---                           K44.9,  Diaphragmatic hernia without obstruction or                            gangrene                           D50.9, Iron deficiency anemia, unspecified                           R19.5, Other fecal abnormalities CPT copyright 2016 American Medical Association. All rights reserved. The codes documented in this report are preliminary and upon coder review may  be revised to meet current compliance requirements. Lorren Rossetti L. Danis MD, MD 10/26/2015 1:55:30 PM This report has been signed electronically. Number of Addenda: 0

## 2015-10-26 NOTE — Progress Notes (Signed)
[facility] PROGRESS NOTE  [name] [id number] DOB: 1950/11/20 DOA: 10/24/2015 PCP: Junie Panning, NP  Length of stay: 2   Assessment/Plan: Principal Problem:   Gastrointestinal bleeding Active Problems:   HTN (hypertension)   Tobacco abuse   [name] is a 65 y.o. male sent by primary care doctor to ED for anemia. Patient very sleepy, offers very little information. He didn't sleep well last night. Wife told emergency room physician that patient had 2 units of blood transfused last week . Patient endorses black stools since last of December. He takes a full aspirin daily but denies use of any other NSAIDs. Patient gives a history of an ulcer at age 34. He endorses chronic nausea and vomiting, no coffee-ground emesis however. It has no abdominal pain. Stools are otherwise normal. Weight stable. Patient has no chest pain or shortness of breath. No fatigue.Microcytic anemia hgb 6.2 , baseline 13.3 in November  Assessment and plan Severe iron deficiency anemia. Presented with a hemoglobin of 6.2, status post 3 units of packed red blood cells, hemoglobin now 9.4 today Baseline hemoglobin 13 in November. Several week history of black stool, (heme + in ED). BUN normal, hemodynamically stable suggesting slow bleed. Heavy Aleve at one time but not in months. Patient poor historian but per wife patient had 2 units of blood last week. No recent GI workup. D/c telemetry Continue Protonix drip For EGD today Patient had a CT scan  05/30/15 that did not show any acute findings   Hx of CVA November. MRI - Non-dominant R paramedian pontine infarct secondary to small vessel disease.  -Hold daily ASA for now  HTN BP running high. Cont prn hydralazine. IMDUR and lisinopril. Might need uptitration.   Tobacco abuse. Desires patch -Nicoderm patch  Hx of HCV per [facility]. Normal appearing liver on CTscan with contrast Nov 2016  Check  hep C RNA quantitative Normal liver  function   DVT prophylaxsis SCDs  Code Status:      Code Status Orders        Start     Ordered   10/24/15 1823  Full code   Continuous     10/24/15 1822     Family Communication: Discussed in detail with the patient, all imaging results, lab results explained to the patient   Disposition Plan:   GI consultation , endoscopic eval in am       Consultation  GI   Procedures:  None   Antibiotics: Anti-infectives    None      HPI/Subjective: No complaints   Objective: Filed Vitals:   10/25/15 1612 10/25/15 1814 10/25/15 2110 10/26/15 0425  BP: 157/73 187/80 175/74 169/81  Pulse: 77 78 76 77  Temp: 97.8 F (36.6 C) 98 F (36.7 C) 98.1 F (36.7 C) 98.2 F (36.8 C)  TempSrc: Oral Oral Oral Oral  Resp: 18 18 18 19   Height:      Weight:      SpO2: 99% 100% 100% 100%    Intake/Output Summary (Last 24 hours) at 10/26/15 0811 Last data filed at 10/25/15 1900  Gross per 24 hour  Intake   2095 ml  Output   3250 ml  Net  -1155 ml    Exam:  General: asleep arousable. Remains groggy Lungs: Clear to auscultation bilaterally without wheezes or crackles Cardiovascular: Regular rate and rhythm without murmur gallop or rub normal S1 and S2 Abdomen: Nontender, nondistended, soft, bowel sounds positive, no  rebound, no ascites, no appreciable mass Extremities: No significant cyanosis, clubbing, or edema bilateral lower extremities     Data Review   Micro Results No results found for this or any previous visit (from the past 240 hour(s)).  Radiology Reports No results found.   CBC  Recent Labs Lab 10/24/15 1155 10/24/15 2253 10/25/15 0512 10/26/15 0638  WBC 5.4 5.8 5.7 7.1  HGB 6.2* 7.4* 7.5* 9.4*  HCT 22.1* 25.6* 26.2* 32.5*  PLT 339 304 311 356  MCV 70.2* 72.7* 72.2* 73.4*  MCH 19.7* 21.0* 20.7* 21.2*  MCHC 28.1* 28.9* 28.6* 28.9*  RDW 22.1* 22.8* 22.8* 23.2*    Chemistries   Recent Labs Lab 10/24/15 1155 10/25/15 0226  NA 138 137   K 4.4 4.0  CL 108 106  CO2 21* 22  GLUCOSE 97 100*  BUN 17 14  CREATININE 0.93 1.01  CALCIUM 9.0 8.9  AST 19  --   ALT 18  --   ALKPHOS 72  --   BILITOT 0.6  --    ------------------------------------------------------------------------------------------------------------------ estimated creatinine clearance is 83.4 mL/min (by C-G formula based on Cr of 1.01). ------------------------------------------------------------------------------------------------------------------ No results for input(s): HGBA1C in the last 72 hours. ------------------------------------------------------------------------------------------------------------------ No results for input(s): CHOL, HDL, LDLCALC, TRIG, CHOLHDL, LDLDIRECT in the last 72 hours. ------------------------------------------------------------------------------------------------------------------ No results for input(s): TSH, T4TOTAL, T3FREE, THYROIDAB in the last 72 hours.  Invalid input(s): FREET3 ------------------------------------------------------------------------------------------------------------------  Recent Labs  10/24/15 1334  VITAMINB12 291  FOLATE 9.3  FERRITIN 1*  TIBC 560*  IRON 23*  RETICCTPCT 1.6    Coagulation profile No results for input(s): INR, PROTIME in the last 168 hours.  No results for input(s): DDIMER in the last 72 hours.  Cardiac Enzymes  Recent Labs Lab 10/25/15 0853 10/25/15 1504 10/25/15 2015  TROPONINI <0.03 <0.03 <0.03   ------------------------------------------------------------------------------------------------------------------ Invalid input(s): POCBNP   CBG:  Recent Labs Lab 10/25/15 1712  GLUCAP 103*       Studies: No results found.    Lab Results  Component Value Date   HGBA1C 5.8* 05/31/2015   Lab Results  Component Value Date   LDLCALC 40 05/31/2015   CREATININE 1.01 10/25/2015       Scheduled Meds: . cyclobenzaprine  10 mg Oral QHS  .  isosorbide mononitrate  60 mg Oral Daily  . lisinopril  10 mg Oral Daily  . nicotine  21 mg Transdermal Daily  . pantoprazole (PROTONIX) IV  40 mg Intravenous Q12H  . zolpidem  5 mg Oral QHS   Continuous Infusions: . sodium chloride 75 mL/hr at 10/24/15 2105    Principal Problem:   Gastrointestinal bleeding Active Problems:   HTN (hypertension)   Tobacco abuse    Time spent: 25 minutes   [facility] [email or web address], password [facility] 10/26/2015, 8:11 AM  LOS: 2 days

## 2015-10-26 NOTE — Progress Notes (Signed)
Patient was d/c'd with wife via wheelchair. Taken to front entrance by [employer], all IV sites were d/c'd. Patient was observed for 30 minutes after finishing IV iron. No reaction noted. All discharge papers were gone over with patient and wife. Including all follow up appointments. All questions were answered, patient and wife expressed understanding.  Jimmie Molly, RN

## 2015-10-26 NOTE — Op Note (Signed)
[facility] Patient Name: [name] Procedure Date : 10/26/2015 MRN: [id number] Attending MD: Estill Cotta. Danis MD, MD Date of Birth: 1951/05/24 CSN: [id number] Age: 65 Admit Type: Inpatient Procedure:                Colonoscopy Indications:              Heme positive stool, Unexplained iron deficiency                            anemia Providers:                Mallie Mussel L. Danis MD, MD, Kingsley Plan, RN, Cherylynn Ridges, Technician Referring MD:              Medicines:                Midazolam 3 mg IV, Fentanyl 50 micrograms IV, See                            the other procedure note for documentation of the                            administered medications Complications:            No immediate complications. Estimated Blood Loss:     Estimated blood loss: none. Procedure:                Pre-Anesthesia Assessment:                           - Prior to the procedure, a History and Physical                            was performed, and patient medications and                            allergies were reviewed. The patient's tolerance of                            previous anesthesia was also reviewed. The risks                            and benefits of the procedure and the sedation                            options and risks were discussed with the patient.                            All questions were answered, and informed consent                            was obtained. Prior Anticoagulants: The patient has  taken no previous anticoagulant or antiplatelet                            agents. ASA Grade Assessment: II - A patient with                            mild systemic disease. After reviewing the risks                            and benefits, the patient was deemed in                            satisfactory condition to undergo the procedure.                           After obtaining informed consent, the  colonoscope                            was passed under direct vision. Throughout the                            procedure, the patient's blood pressure, pulse, and                            oxygen saturations were monitored continuously. The                            EC-[id number] scope was introduced through                            the anus and advanced to the the cecum, identified                            by appendiceal orifice and ileocecal valve. The                            colonoscopy was technically difficult and complex                            due to a tortuous colon. Successful completion of                            the procedure was aided by applying abdominal                            pressure. The patient tolerated the procedure well.                            The quality of the bowel preparation was excellent.                            The ileocecal valve, appendiceal orifice, and  rectum were photographed. The bowel preparation                            used was MoviPrep. Scope In: 1:24:42 PM Scope Out: 1:43:41 PM Scope Withdrawal Time: 0 hours 8 minutes 13 seconds  Total Procedure Duration: 0 hours 18 minutes 59 seconds  Findings:      The perianal and digital rectal examinations were normal.      Internal hemorrhoids were found during retroflexion. The hemorrhoids       were Grade I (internal hemorrhoids that do not prolapse).      The exam was otherwise without abnormality. Impression:               - Internal hemorrhoids.                           - The examination was otherwise normal.                           - No specimens collected.                           No source for blood loss anemia found on either EGD                            or colonoscopy. Patient has had IDA dating back to                            at least 2010 (with a ferritin = 2 at that time).                            The patient has been  taking aspirin and NSAIDs, so                            obscure SB ulceration may be the culprit. Moderate Sedation:      Moderate (conscious) sedation was administered by the endoscopy nurse       and supervised by the endoscopist. The following parameters were       monitored: oxygen saturation, heart rate, blood pressure, respiratory       rate, EKG, adequacy of pulmonary ventilation, and response to care. Recommendation:           - Resume regular diet.                           - Repeat colonoscopy in 5 years for screening                            purposes.                           - The patient can be discharged home today on oral                            iron tablets and off all aspirin and NSAIDs.  My office will contact him to arrange a CBC in 2                            weeks and office follow up. If IDA persists despite                            iron supplements andavoidance of aspirin and                            NSAIDs, SB xray series will be done to see if any                            SB stricture (multiple prior surgeries) prior to                            video capsule study.                           - Continue present medications. Procedure Code(s):        --- Professional ---                           [phone], Colonoscopy, flexible; diagnostic, including                            collection of specimen(s) by brushing or washing,                            when performed (separate procedure) Diagnosis Code(s):        --- Professional ---                           K64.0, First degree hemorrhoids                           R19.5, Other fecal abnormalities                           D50.9, Iron deficiency anemia, unspecified CPT copyright 2016 American Medical Association. All rights reserved. The codes documented in this report are preliminary and upon coder review may  be revised to meet current compliance requirements. Laqueta Bonaventura L. Danis  MD, MD 10/26/2015 2:03:59 PM This report has been signed electronically. Number of Addenda: 0

## 2015-10-27 ENCOUNTER — Telehealth: Payer: Self-pay

## 2015-10-27 DIAGNOSIS — D509 Iron deficiency anemia, unspecified: Secondary | ICD-10-CM

## 2015-10-27 NOTE — Telephone Encounter (Signed)
-----   Message from Doran Stabler, MD sent at 10/26/2015  2:07 PM EDT ----- Chong Sicilian,    This patient was seen in hospital consult for iron deficiency anemia. He does not have a consistent and reliable PCP, so please make arrangements for him to have a hemoglobin /hematocrit in 2 weeks, and an office visit with me in 4-5 weeks.  thanks

## 2015-10-28 ENCOUNTER — Telehealth: Payer: Self-pay | Admitting: Gastroenterology

## 2015-10-28 ENCOUNTER — Telehealth: Payer: Self-pay

## 2015-10-28 ENCOUNTER — Encounter (HOSPITAL_COMMUNITY): Payer: Self-pay | Admitting: Gastroenterology

## 2015-10-28 NOTE — Telephone Encounter (Signed)
Error

## 2015-10-28 NOTE — Telephone Encounter (Signed)
The appt has been scheduled for 11/28/15, he has been notified.

## 2015-10-28 NOTE — Telephone Encounter (Signed)
appt has been scheduled and pt is aware.  

## 2015-11-07 ENCOUNTER — Encounter (HOSPITAL_COMMUNITY): Payer: Self-pay | Admitting: Emergency Medicine

## 2015-11-07 ENCOUNTER — Emergency Department (HOSPITAL_COMMUNITY)
Admission: EM | Admit: 2015-11-07 | Discharge: 2015-11-07 | Disposition: A | Discharge status: Home / Self Care | Payer: Medicare Other | Attending: Emergency Medicine | Admitting: Emergency Medicine

## 2015-11-07 ENCOUNTER — Emergency Department (HOSPITAL_COMMUNITY): Payer: Medicare Other

## 2015-11-07 DIAGNOSIS — M1990 Unspecified osteoarthritis, unspecified site: Secondary | ICD-10-CM | POA: Diagnosis not present

## 2015-11-07 DIAGNOSIS — Z8619 Personal history of other infectious and parasitic diseases: Secondary | ICD-10-CM | POA: Insufficient documentation

## 2015-11-07 DIAGNOSIS — I10 Essential (primary) hypertension: Secondary | ICD-10-CM | POA: Insufficient documentation

## 2015-11-07 DIAGNOSIS — Z791 Long term (current) use of non-steroidal anti-inflammatories (NSAID): Secondary | ICD-10-CM | POA: Diagnosis not present

## 2015-11-07 DIAGNOSIS — Z8546 Personal history of malignant neoplasm of prostate: Secondary | ICD-10-CM | POA: Insufficient documentation

## 2015-11-07 DIAGNOSIS — F17210 Nicotine dependence, cigarettes, uncomplicated: Secondary | ICD-10-CM | POA: Diagnosis not present

## 2015-11-07 DIAGNOSIS — R55 Syncope and collapse: Secondary | ICD-10-CM

## 2015-11-07 DIAGNOSIS — Z79899 Other long term (current) drug therapy: Secondary | ICD-10-CM | POA: Insufficient documentation

## 2015-11-07 DIAGNOSIS — K219 Gastro-esophageal reflux disease without esophagitis: Secondary | ICD-10-CM | POA: Insufficient documentation

## 2015-11-07 DIAGNOSIS — G4700 Insomnia, unspecified: Secondary | ICD-10-CM | POA: Diagnosis not present

## 2015-11-07 DIAGNOSIS — G8929 Other chronic pain: Secondary | ICD-10-CM | POA: Insufficient documentation

## 2015-11-07 DIAGNOSIS — R531 Weakness: Secondary | ICD-10-CM | POA: Insufficient documentation

## 2015-11-07 DIAGNOSIS — Z880 Allergy status to penicillin: Secondary | ICD-10-CM | POA: Insufficient documentation

## 2015-11-07 LAB — CBC WITH DIFFERENTIAL/PLATELET
Basophils Absolute: 0 10*3/uL (ref 0.0–0.1)
Basophils Relative: 0 %
Eosinophils Absolute: 0 10*3/uL (ref 0.0–0.7)
Eosinophils Relative: 0 %
HCT: 32.2 % — ABNORMAL LOW (ref 39.0–52.0)
Hemoglobin: 9.6 g/dL — ABNORMAL LOW (ref 13.0–17.0)
Lymphocytes Relative: 14 %
Lymphs Abs: 1.4 10*3/uL (ref 0.7–4.0)
MCH: 23.1 pg — ABNORMAL LOW (ref 26.0–34.0)
MCHC: 29.8 g/dL — ABNORMAL LOW (ref 30.0–36.0)
MCV: 77.6 fL — ABNORMAL LOW (ref 78.0–100.0)
Monocytes Absolute: 0.4 10*3/uL (ref 0.1–1.0)
Monocytes Relative: 4 %
Neutro Abs: 8.3 10*3/uL — ABNORMAL HIGH (ref 1.7–7.7)
Neutrophils Relative %: 82 %
Platelets: 391 10*3/uL (ref 150–400)
RBC: 4.15 MIL/uL — ABNORMAL LOW (ref 4.22–5.81)
RDW: 25.5 % — ABNORMAL HIGH (ref 11.5–15.5)
WBC: 10.1 10*3/uL (ref 4.0–10.5)

## 2015-11-07 LAB — BASIC METABOLIC PANEL
Anion gap: 12 (ref 5–15)
BUN: 24 mg/dL — ABNORMAL HIGH (ref 6–20)
CO2: 21 mmol/L — ABNORMAL LOW (ref 22–32)
Calcium: 9.2 mg/dL (ref 8.9–10.3)
Chloride: 106 mmol/L (ref 101–111)
Creatinine, Ser: 1.21 mg/dL (ref 0.61–1.24)
GFR calc Af Amer: >60 mL/min (ref >60)
GFR calc non Af Amer: >60 mL/min (ref >60)
Glucose, Bld: 99 mg/dL (ref 65–99)
Potassium: 4.4 mmol/L (ref 3.5–5.1)
Sodium: 139 mmol/L (ref 135–145)

## 2015-11-07 LAB — I-STAT TROPONIN, ED: Troponin i, poc: 0.01 ng/mL (ref 0.00–0.08)

## 2015-11-07 LAB — CBG MONITORING, ED: Glucose-Capillary: 94 mg/dL (ref 65–99)

## 2015-11-07 MED ORDER — SODIUM CHLORIDE 0.9 % IV BOLUS (SEPSIS)
500.0000 mL | Freq: Once | INTRAVENOUS | Status: AC
Start: 2015-11-07 — End: 2015-11-07
  Administered 2015-11-07: 500 mL via INTRAVENOUS

## 2015-11-07 NOTE — Discharge Instructions (Signed)

## 2015-11-07 NOTE — ED Provider Notes (Signed)
CSN: [id number]     Arrival date & time 11/07/15  1445 History   First MD Initiated Contact with Patient 11/07/15 1501     Chief Complaint  Patient presents with  . Loss of Consciousness    [name] is a 65 y.o. male who presents to the emergency department by EMS after a syncopal episode witnessed by friends today. The patient tells me that she was sitting at home inside when he became overheated. He reports of the air conditioning have been turned on but had not yet caught up. He tells me he did not pass out however bystanders told EMS that the patient had a syncopal episode. Further details were unable to be obtained around the details of his syncope. He tells me he did not loose consciousness.  The patient has a history of strokes and has left sided deficits and uses a cane to walk.  He denies hematemesis or hematochezia. He denies hematuria. He denies lightheadedness or dizziness. He reports chronic left arm and leg weakness that has not worsened today. He denies fevers, abdominal pain, nausea, vomiting, rashes, headache, neck pain, back pain, chest pain, or shortness of breath.   The history is provided by the patient and the EMS personnel. No language interpreter was used.    Past Medical History  Diagnosis Date  . Hyperlipidemia     not on any meds  . Hypertension     takes Amlodipine and Lisinopril daily  . Insomnia     takes Ambien nightly  . History of blood transfusion     no abnormal reaction noted  . PONV (postoperative nausea and vomiting)   . Shortness of breath dyspnea     rarely but when notices he can be lying/sitting/exertion.Dr.Hochrein is aware per pt  . Headache     occasionally  . Numbness     both legs occasionally  . Arthritis   . Joint pain   . Joint swelling   . Chronic back pain   . History of gout     doesn't take any meds  . GERD (gastroesophageal reflux disease)     uses Baking Soda  . Hepatitis C   . Urinary frequency   . Urinary urgency    . Nocturia   . Glaucoma     right eye  . Cancer [facility])     Prostate CA  . GIB (gastrointestinal bleeding) 2012  . Ischemic colitis ([place]) 2012   Past Surgical History  Procedure Laterality Date  . Appendectomy    . Small intestine surgery    . Multiple abdominal surgeries      total of 13  . Prostatectomy    . Colonoscopy    . Esophagogastroduodenoscopy    . Inguinal hernia repair Right 11/04/2014    Procedure: RIGHT INGUINAL HERNIA REPAIR WITH MESH;  Surgeon: Jackolyn Confer, MD;  Location: [place];  Service: General;  Laterality: Right;  . Mass excision N/A 11/04/2014    Procedure: REMOVAL OF RIGHT GROIN SOFT TISSUE MASS;  Surgeon: Jackolyn Confer, MD;  Location: [place];  Service: General;  Laterality: N/A;  . Esophagogastroduodenoscopy N/A 10/26/2015    Procedure: ESOPHAGOGASTRODUODENOSCOPY (EGD);  Surgeon: Doran Stabler, MD;  Location: [facility] ENDOSCOPY;  Service: Endoscopy;  Laterality: N/A;  . Colonoscopy N/A 10/26/2015    Procedure: COLONOSCOPY;  Surgeon: Doran Stabler, MD;  Location: [facility] ENDOSCOPY;  Service: Endoscopy;  Laterality: N/A;   Family History  Problem Relation Age of Onset  .  Alzheimer's disease Father   . Cancer Mother     Lymph node  . Heart failure Brother 45    Transplant 13 years ago  . CAD Brother   . Heart disease Sister 68    MI   Social History  Substance Use Topics  . Smoking status: Current Every Day Smoker -- 0.50 packs/day for 50 years    Types: Cigarettes  . Smokeless tobacco: Never Used  . Alcohol Use: 0.0 oz/week     Comment: occasionally    Review of Systems  Constitutional: Negative for fever and chills.  HENT: Negative for congestion and sore throat.   Eyes: Negative for visual disturbance.  Respiratory: Negative for cough, shortness of breath and wheezing.   Cardiovascular: Negative for chest pain.  Gastrointestinal: Negative for nausea, vomiting, abdominal pain and diarrhea.  Genitourinary: Negative for dysuria, frequency,  hematuria and difficulty urinating.  Musculoskeletal: Negative for back pain and neck pain.  Skin: Negative for rash.  Neurological: Positive for syncope and weakness (chronic ). Negative for light-headedness, numbness and headaches.      Allergies  Penicillins and Sudafed  Home Medications   Prior to Admission medications   Medication Sig Start Date End Date Taking? Authorizing Provider  cyclobenzaprine (FLEXERIL) 10 MG tablet Take 10 mg by mouth at bedtime. 10/06/15  Yes Historical Provider, MD  dexlansoprazole (DEXILANT) 60 MG capsule Take 60 mg by mouth daily.   Yes Historical Provider, MD  diclofenac sodium (VOLTAREN) 1 % GEL Apply 2 g topically 3 (three) times daily as needed (Severe knee pain). 06/04/15  Yes Geradine Girt, DO  ferrous sulfate 325 (65 FE) MG tablet Take 1 tablet (325 mg total) by mouth daily with breakfast. 10/26/15  Yes Delfina Redwood, MD  hydrochlorothiazide (HYDRODIURIL) 25 MG tablet Take 1 tablet (25 mg total) by mouth daily. 06/04/15  Yes Geradine Girt, DO  isosorbide mononitrate (IMDUR) 60 MG 24 hr tablet Take 1 tablet (60 mg total) by mouth daily. 06/04/15  Yes Geradine Girt, DO  lisinopril (PRINIVIL,ZESTRIL) 10 MG tablet Take 10 mg by mouth daily. 10/06/15  Yes Historical Provider, MD  LORazepam (ATIVAN) 0.5 MG tablet Take 1 tablet (0.5 mg total) by mouth every 8 (eight) hours as needed for anxiety. 06/04/15  Yes Geradine Girt, DO  solifenacin (VESICARE) 5 MG tablet Take 5 mg by mouth at bedtime.    Yes Historical Provider, MD  zolpidem (AMBIEN) 10 MG tablet Take 10 mg by mouth at bedtime. 10/10/15  Yes Historical Provider, MD  HYDROcodone-acetaminophen (NORCO/VICODIN) 5-325 MG tablet Take 1 tablet by mouth every 6 (six) hours as needed for severe pain. Patient not taking: Reported on 11/07/2015 06/04/15   Geradine Girt, DO  nicotine (NICODERM CQ - DOSED IN MG/24 HOURS) 14 mg/24hr patch Place 1 patch (14 mg total) onto the skin daily. Patient not taking:  Reported on 11/07/2015 06/04/15   Tomi Bamberger Vann, DO  senna-docusate (SENOKOT-S) 8.6-50 MG tablet Take 1 tablet by mouth at bedtime as needed for moderate constipation. Patient not taking: Reported on 11/07/2015 06/04/15   Jessica U Vann, DO   BP 138/73 mmHg  Pulse 74  Temp(Src) 98.5 F (36.9 C) (Oral)  Resp 23  SpO2 98% Physical Exam  Constitutional: He is oriented to person, place, and time. He appears well-developed and well-nourished. No distress.  Nontoxic appearing. Patient appears sleepy. Patient is alert and responds to verbal stimuli when walking into the room.  HENT:  Head: Normocephalic and  atraumatic.  Right Ear: External ear normal.  Left Ear: External ear normal.  Mouth/Throat: Oropharynx is clear and moist.  Eyes: Conjunctivae and EOM are normal. Pupils are equal, round, and reactive to light. Right eye exhibits no discharge. Left eye exhibits no discharge.  Neck: Neck supple.  Cardiovascular: Normal rate, regular rhythm, normal heart sounds and intact distal pulses.  Exam reveals no gallop and no friction rub.   No murmur heard. Bilateral radial and dorsalis pedis pulses are intact.  Pulmonary/Chest: Effort normal and breath sounds normal. No respiratory distress. He has no wheezes. He has no rales.  Lungs are clear to auscultation bilaterally.  Abdominal: Soft. Bowel sounds are normal. He exhibits no distension. There is no tenderness. There is no guarding.  Musculoskeletal: He exhibits no edema or tenderness.  No lower extremity edema or tenderness. Patient has 5 out of 5 strength to his right upper and lower extremities and 4 out of 5 strength to his left upper and lower extremities. He reports this is chronic and unchanged. He has good grip strength bilaterally.   Lymphadenopathy:    He has no cervical adenopathy.  Neurological: He is alert and oriented to person, place, and time. No cranial nerve deficit. Coordination normal.  The patient is alert and oriented 3. He  is sleepy but will open his eyes and speak to verbal stimuli. He has 4 out of 5 strength to his left upper and lower extremity and 5 out of 5 strength to his right upper and lower extremity. He reports this is chronic due to a previous stroke. His speech is clear and coherent. EOMs are intact. Sensation is intact his bilateral upper and lower extremities. No pronator drift. Finger-to-nose intact bilaterally.   Skin: Skin is warm and dry. No rash noted. He is not diaphoretic. No erythema. No pallor.  Psychiatric: He has a normal mood and affect. His behavior is normal.  Nursing note and vitals reviewed.   ED Course  Procedures (including critical care time) Labs Review Labs Reviewed  BASIC METABOLIC PANEL - Abnormal; Notable for the following:    CO2 21 (*)    BUN 24 (*)    All other components within normal limits  CBC WITH DIFFERENTIAL/PLATELET - Abnormal; Notable for the following:    RBC 4.15 (*)    Hemoglobin 9.6 (*)    HCT 32.2 (*)    MCV 77.6 (*)    MCH 23.1 (*)    MCHC 29.8 (*)    RDW 25.5 (*)    Neutro Abs 8.3 (*)    All other components within normal limits  I-STAT TROPOININ, ED  CBG MONITORING, ED    Imaging Review Dg Chest 2 View  11/07/2015  CLINICAL DATA:  Syncope. Pt to ER via [facility] after experiencing a syncopal episode witnessed by friends. Pt was just sitting when friends report he "blacked out." Pt denies syncopal episode. Pt denies any chest pains at this time. EXAM: CHEST  2 VIEW COMPARISON:  04/03/2014 FINDINGS: The heart is enlarged. There is pulmonary vascular congestion. No overt alveolar edema. Shallow lung inflation. No focal consolidations or pleural effusions. IMPRESSION: Cardiomegaly and pulmonary vascular congestion. Electronically Signed   By: Nolon Nations M.D.   On: 11/07/2015 15:47   Ct Head Wo Contrast  11/07/2015  CLINICAL DATA:  Syncope EXAM: CT HEAD WITHOUT CONTRAST TECHNIQUE: Contiguous axial images were obtained from the base of the skull  through the vertex without intravenous contrast. COMPARISON:  05/30/2015. FINDINGS: There is  no mass effect, midline shift, or acute hemorrhage. Minimal chronic ischemic changes are present in the periventricular white matter. Ventricular system is unremarkable. Mastoid air cells are clear. Visualized paranasal sinuses are clear. Cranium is intact. IMPRESSION: No acute intracranial pathology. Electronically Signed   By: Marybelle Killings M.D.   On: 11/07/2015 16:35   I have personally reviewed and evaluated these images and lab results as part of my medical decision-making.   EKG Interpretation   Date/Time:  Friday November 07 2015 14:49:04 EDT Ventricular Rate:  78 PR Interval:  186 QRS Duration: 89 QT Interval:  406 QTC Calculation: 462 R Axis:   -22 Text Interpretation:  Sinus rhythm Probable left atrial enlargement Left  ventricular hypertrophy with repolarization abnormality Confirmed by  ZAMMIT  MD, JOSEPH ([id number]) on 11/07/2015 3:20:56 PM     Filed Vitals:   11/07/15 1530 11/07/15 1630 11/07/15 1700 11/07/15 1730  BP: 120/69 141/82 148/81 138/73  Pulse: 72 75 73 74  Temp:      TempSrc:      Resp: 25 16 23 23   SpO2: 97% 95% 98% 98%    MDM   Meds given in ED:  Medications  sodium chloride 0.9 % bolus 500 mL (500 mLs Intravenous New Bag/Given 11/07/15 1711)    New Prescriptions   No medications on file    Final diagnoses:  Syncope and collapse   This is a 66 y.o. male who presents to the emergency department by EMS after a syncopal episode witnessed by friends today. The patient tells me that she was sitting at home inside when he became overheated. He reports of the air conditioning have been turned on but had not yet caught up. He tells me he did not pass out however bystanders told EMS that the patient had a syncopal episode. Further details were unable to be obtained around the details of his syncope. He tells me he did not loose consciousness.  The patient has a history of  strokes and has left sided deficits and uses a cane to walk.  He denies hematemesis or hematochezia. He denies hematuria. He denies lightheadedness or dizziness. He reports chronic left arm and leg weakness that has not worsened today. On exam the patient is afebrile and nontoxic appearing. On initial exam the patient is sleepy but alert to verbal stimuli. He tells me he just felt overheated and almost passed out. He denies having any chest pain or shortness of breath. He has no focal neurological deficits on exam. He does have some left-sided weakness which the patient reports is chronic and unchanged.  As the patient is sleepy and has had a syncopal episode will obtain blood work, troponin, chest x-ray and CT head. Troponin is 0.01. BMP is unremarkable. CBC reveals a hemoglobin of 9.6. This is around the patient's baseline. He denies any hematemesis or hematochezia. Chest x-ray revealed some cardiomegaly and pulmonary vascular congestion. Patient denies any chest pain or shortness of breath. He denies any prodromal symptoms prior to the syncope other than feeling overheated. I encouraged close follow-up with his primary care about this. CT head is unremarkable. The patient is not orthostatic. At reevaluation after workup has completed and patient received a half liter fluid bolus the patient is awake and alert and speaking with his wife. He is no longer sleepy. He reports he is feeling much better now that he has been in air conditioning and has rested. He has no complaints and is requesting to be discharged. He is  tolerating by mouth. I discussed the findings and encouraged close follow up with his PCP. I discussed strict and specific return precautions. I advised the patient to follow-up with their primary care provider this week. I advised the patient to return to the emergency department with new or worsening symptoms or new concerns. The patient verbalized understanding and agreement with plan.   This  patient was discussed with Dr. Stark Jock who agrees with assessment and plan.   Waynetta Pean, PA-C 11/07/15 1810  Veryl Speak, MD 11/07/15 2240

## 2015-11-07 NOTE — ED Notes (Signed)
Pt to ER via [facility] after experiencing a syncopal episode witnessed by friends. Pt was just sitting when friends report he "blacked out." pt VSS. Pt alert and oriented x4. Pt reports hx of multiple strokes. EMS reports that patient was diaphoretic when they arrived, pt 12 lead showing elevation in V2 and V3. Pt denies chest pain. Pt reports he did not have syncopal episode. Pt noted to be very lethargic at this time which EMS reports just started.

## 2015-11-26 ENCOUNTER — Other Ambulatory Visit (INDEPENDENT_AMBULATORY_CARE_PROVIDER_SITE_OTHER): Payer: Medicare Other

## 2015-11-26 DIAGNOSIS — D509 Iron deficiency anemia, unspecified: Secondary | ICD-10-CM

## 2015-11-26 LAB — HEMOGLOBIN AND HEMATOCRIT, BLOOD
HCT: 34.8 % — ABNORMAL LOW (ref 39.0–52.0)
Hemoglobin: 10.6 g/dL — ABNORMAL LOW (ref 13.0–17.0)

## 2015-11-28 ENCOUNTER — Ambulatory Visit: Payer: Medicare Other | Admitting: Gastroenterology

## 2015-11-28 ENCOUNTER — Other Ambulatory Visit: Payer: Self-pay

## 2015-11-28 DIAGNOSIS — D509 Iron deficiency anemia, unspecified: Secondary | ICD-10-CM

## 2015-12-05 ENCOUNTER — Encounter: Payer: Self-pay | Admitting: Adult Health

## 2015-12-05 ENCOUNTER — Ambulatory Visit (INDEPENDENT_AMBULATORY_CARE_PROVIDER_SITE_OTHER): Payer: Medicare Other | Admitting: Adult Health

## 2015-12-05 VITALS — BP 160/96 | HR 83 | Temp 98.3°F | Ht 69.5 in | Wt 218.8 lb

## 2015-12-05 DIAGNOSIS — I639 Cerebral infarction, unspecified: Secondary | ICD-10-CM

## 2015-12-05 DIAGNOSIS — F329 Major depressive disorder, single episode, unspecified: Secondary | ICD-10-CM

## 2015-12-05 DIAGNOSIS — Z7189 Other specified counseling: Secondary | ICD-10-CM

## 2015-12-05 DIAGNOSIS — F17200 Nicotine dependence, unspecified, uncomplicated: Secondary | ICD-10-CM | POA: Diagnosis not present

## 2015-12-05 DIAGNOSIS — I10 Essential (primary) hypertension: Secondary | ICD-10-CM | POA: Diagnosis not present

## 2015-12-05 DIAGNOSIS — Z7689 Persons encountering health services in other specified circumstances: Secondary | ICD-10-CM

## 2015-12-05 DIAGNOSIS — F32A Depression, unspecified: Secondary | ICD-10-CM

## 2015-12-05 MED ORDER — CLONIDINE HCL 0.1 MG PO TABS
0.1000 mg | ORAL_TABLET | Freq: Once | ORAL | Status: AC
  Administered 2015-12-05: 0.1 mg via ORAL

## 2015-12-05 MED ORDER — BUPROPION HCL ER (XL) 300 MG PO TB24: 300.0000 mg | ORAL_TABLET | Freq: Every day | ORAL | Status: DC

## 2015-12-05 MED ORDER — LISINOPRIL 40 MG PO TABS: 40.0000 mg | ORAL_TABLET | Freq: Every day | ORAL | Status: DC

## 2015-12-05 MED ORDER — CLONIDINE HCL 0.1 MG PO TABS: ORAL_TABLET | ORAL | Status: DC

## 2015-12-05 NOTE — Progress Notes (Signed)
Pre visit review using our clinic review tool, if applicable. No additional management support is needed unless otherwise documented below in the visit note. 

## 2015-12-05 NOTE — Patient Instructions (Signed)
It was great meeting you today!  We have a lot of work to do but it can be done.   1. I have increased your lisinopril to 40 mg, take this daily.  2. Use the Clonidine if your blood pressure goes above 160/90 3. Take Wellbutrin every day - this will help you with your depression and to quit smoking 4. Follow up with me in one week 5. Someone from Neurology will call you to schedule na appointment.

## 2015-12-05 NOTE — Progress Notes (Signed)
Patient presents to clinic today to establish care. [name] is a pleasant 65 year old AA male who  has a past medical history of Hyperlipidemia; Hypertension; Insomnia; History of blood transfusion; PONV (postoperative nausea and vomiting); Shortness of breath dyspnea; Headache; Numbness; Arthritis; Joint pain; Chronic back pain; History of gout; GERD (gastroesophageal reflux disease); Hepatitis C; Urinary frequency; Urinary urgency; Nocturia; Glaucoma; Prostate cancer ([place]); GIB (gastrointestinal bleeding) (2012); Ischemic colitis ([place]) (2012); and Stroke [name]) (2016 ).   [name] presents to the office visit today with his girlfriend  In November 2016 [name] was diagnosed with an acute right sided lacunar type infarct involving the pons after [name] presented to the ER with left sided weakness. [name] was supposed to follow up at the stroke clinic but never went to his appointment.   [name] has a history of medical non compliance and not showing up to appointments.    Acute Concerns: Establish Care   Depression - [name] feels as though since his stroke [name] has had a hard time with depression. [name] feels as though [name] cannot enjoy his life any longer and do the things [name] once did. [name] denies any SI or HI.   Chronic Issues: Tobacco Use  - [name] is ready to quit smoking and [name] knows that [name] needs to. [name] is smoking about a half pack a day, has a 25 pack years. Has not tried anything in the past to to try and quit smoking  Hypertension  - Endorses taking his medications as directed. [name] was admitted to the hospital in April 2017 for Anemia. In the hospital his blood pressure was elevated. On discharge his BP was 173/91. Reviewing notes shows that [name] has a history of hard to control blood pressure.   Stroke - Continues to have some left sided weakness. Walks with a cane. Has never seen a neurologist. Is not taking ASA due to GI bleed.    Health Maintenance: Dental -- Has dentures  Vision -- Does not see  Immunizations --  UTD Colonoscopy -- 10/2015 - Normal   Diet: Does not follow a diet Exercise: Does not exercise.   Is followed by:  -  Cardiology - [facility]   Past Medical History  Diagnosis Date  . Hyperlipidemia     not on any meds  . Hypertension     takes Amlodipine and Lisinopril daily  . Insomnia     takes Ambien nightly  . History of blood transfusion     no abnormal reaction noted  . PONV (postoperative nausea and vomiting)   . Shortness of breath dyspnea     rarely but when notices [name] can be lying/sitting/exertion.Dr.Hochrein is aware per pt  . Headache     occasionally  . Numbness     both legs occasionally  . Arthritis   . Joint pain   . Chronic back pain   . History of gout     doesn't take any meds  . GERD (gastroesophageal reflux disease)     uses Baking Soda  . Hepatitis C   . Urinary frequency   . Urinary urgency   . Nocturia   . Glaucoma     right eye  . Prostate cancer ([place])   . GIB (gastrointestinal bleeding) 2012  . Ischemic colitis ([place]) 2012  . Stroke [facility]) 2016     Past Surgical History  Procedure Laterality Date  . Appendectomy    . Small intestine surgery    . Multiple abdominal  surgeries      total of 13  . Prostatectomy    . Colonoscopy    . Esophagogastroduodenoscopy    . Inguinal hernia repair Right 11/04/2014    Procedure: RIGHT INGUINAL HERNIA REPAIR WITH MESH;  Surgeon: Jackolyn Confer, MD;  Location: [place];  Service: General;  Laterality: Right;  . Mass excision N/A 11/04/2014    Procedure: REMOVAL OF RIGHT GROIN SOFT TISSUE MASS;  Surgeon: Jackolyn Confer, MD;  Location: [place];  Service: General;  Laterality: N/A;  . Esophagogastroduodenoscopy N/A 10/26/2015    Procedure: ESOPHAGOGASTRODUODENOSCOPY (EGD);  Surgeon: Doran Stabler, MD;  Location: [facility] ENDOSCOPY;  Service: Endoscopy;  Laterality: N/A;  . Colonoscopy N/A 10/26/2015    Procedure: COLONOSCOPY;  Surgeon: Doran Stabler, MD;  Location: [facility] ENDOSCOPY;  Service: Endoscopy;   Laterality: N/A;    Current Outpatient Prescriptions on File Prior to Visit  Medication Sig Dispense Refill  . cyclobenzaprine (FLEXERIL) 10 MG tablet Take 10 mg by mouth at bedtime.  1  . dexlansoprazole (DEXILANT) 60 MG capsule Take 60 mg by mouth daily.    . diclofenac sodium (VOLTAREN) 1 % GEL Apply 2 g topically 3 (three) times daily as needed (Severe knee pain). 100 g 0  . ferrous sulfate 325 (65 FE) MG tablet Take 1 tablet (325 mg total) by mouth daily with breakfast. 30 tablet 1  . hydrochlorothiazide (HYDRODIURIL) 25 MG tablet Take 1 tablet (25 mg total) by mouth daily. 30 tablet 0  . HYDROcodone-acetaminophen (NORCO/VICODIN) 5-325 MG tablet Take 1 tablet by mouth every 6 (six) hours as needed for severe pain. 15 tablet 0  . isosorbide mononitrate (IMDUR) 60 MG 24 hr tablet Take 1 tablet (60 mg total) by mouth daily. 30 tablet 0  . lisinopril (PRINIVIL,ZESTRIL) 10 MG tablet Take 10 mg by mouth daily.  3  . LORazepam (ATIVAN) 0.5 MG tablet Take 1 tablet (0.5 mg total) by mouth every 8 (eight) hours as needed for anxiety. 10 tablet 0  . nicotine (NICODERM CQ - DOSED IN MG/24 HOURS) 14 mg/24hr patch Place 1 patch (14 mg total) onto the skin daily. 28 patch 0  . senna-docusate (SENOKOT-S) 8.6-50 MG tablet Take 1 tablet by mouth at bedtime as needed for moderate constipation.    . solifenacin (VESICARE) 5 MG tablet Take 5 mg by mouth at bedtime.     Marland Kitchen zolpidem (AMBIEN) 10 MG tablet Take 10 mg by mouth at bedtime.  3   No current facility-administered medications on file prior to visit.    Allergies  Allergen Reactions  . Penicillins Anaphylaxis  . Sudafed [Pseudoephedrine] Other (See Comments)    Heart "races"    Family History  Problem Relation Age of Onset  . Alzheimer's disease Father   . Cancer Mother     Lymph node  . Heart failure Brother 45    Transplant 13 years ago  . CAD Brother   . Heart disease Sister 23    MI    Social History   Social History  . Marital  Status: Married    Spouse Name: N/A  . Number of Children: 2  . Years of Education: N/A   Occupational History  . Not on file.   Social History Main Topics  . Smoking status: Current Every Day Smoker -- 0.50 packs/day for 50 years    Types: Cigarettes  . Smokeless tobacco: Never Used  . Alcohol Use: 0.0 oz/week    0 Standard drinks or equivalent  per week     Comment: occasionally  . Drug Use: No  . Sexual Activity: Yes   Other Topics Concern  . Not on file   Social History Narrative   One living child .  One murdered.  Lives with girlfriend.      Review of Systems  Constitutional: Negative.   Eyes: Negative.   Respiratory: Negative.   Cardiovascular: Negative.   Gastrointestinal: Negative.   Musculoskeletal: Positive for joint pain (bilateral knees).  Neurological: Positive for headaches. Negative for dizziness, tremors and loss of consciousness.  Psychiatric/Behavioral: Positive for depression. Negative for suicidal ideas. The patient has insomnia.     BP 198/100 mmHg  Pulse 83  Temp(Src) 98.3 F (36.8 C) (Oral)  Ht 5' 9.5" (1.765 m)  Wt 218 lb 12.8 oz (99.247 kg)  BMI 31.86 kg/m2  SpO2 96%  Physical Exam  Constitutional: [name] is oriented to person, place, and time and well-developed, well-nourished, and in no distress. No distress.  Cardiovascular: Normal rate, regular rhythm, normal heart sounds and intact distal pulses.  Exam reveals no gallop and no friction rub.   No murmur heard. Pulmonary/Chest: Effort normal and breath sounds normal. No respiratory distress. [name] has no wheezes. [name] has no rales. [name] exhibits no tenderness.  Musculoskeletal: [name] exhibits tenderness. [name] exhibits no edema.       Right knee: [name] exhibits bony tenderness. [name] exhibits no swelling and no effusion. Tenderness found.       Left knee: [name] exhibits decreased range of motion and bony tenderness. [name] exhibits no swelling and no effusion. Tenderness found.  Neurological: [name] is alert and  oriented to person, place, and time. Gait normal. GCS score is 15.  Skin: Skin is warm and dry. No rash noted. [name] is not diaphoretic. No erythema. No pallor.  Psychiatric: Mood, memory, affect and judgment normal.  Nursing note and vitals reviewed.    Assessment/Plan: 1. Encounter to establish care - Follow up for PE - Follow up in one week for CPE - Needs to quit smoking, start eating healthier and start exercising.   2. Depression - buPROPion (WELLBUTRIN XL) 300 MG 24 hr tablet; Take 1 tablet (300 mg total) by mouth daily.  Dispense: 90 tablet; Refill: 2 - Stop medication and go to the ER with any thoughts of SI or self harm .   3. Tobacco use disorder - Advised to pick a quit date and stick to it. Will follow up withhim next week when [name] comes in for BP check and then in a month.  - Spent about 5 minutes reviewing options to help him quit smoking  - buPROPion (WELLBUTRIN XL) 300 MG 24 hr tablet; Take 1 tablet (300 mg total) by mouth daily.  Dispense: 90 tablet; Refill: 2  4. Essential hypertension - BP 198/100 in the office, 0.1 mg clonidine given and blood pressure was rechecked 15 minutes later. At this time BP 160/96  - lisinopril (PRINIVIL,ZESTRIL) 40 MG tablet; Take 1 tablet (40 mg total) by mouth daily.  Dispense: 90 tablet; Refill: 3 - cloNIDine (CATAPRES) tablet 0.1 mg; Take 1 tablet (0.1 mg total) by mouth once. - cloNIDine (CATAPRES) 0.1 MG tablet; Take one tablet for blood pressure greater than 0000000 systolic and 90 dyastolic  Dispense: 60 tablet; Refill: 3 - Follow up in one week.   5. Acute CVA (cerebrovascular accident) [facility]) - Ambulatory referral to Neurology - Consider referral to PT in the future.   Dorothyann Peng, NP

## 2015-12-08 ENCOUNTER — Telehealth: Payer: Self-pay

## 2015-12-08 ENCOUNTER — Ambulatory Visit (INDEPENDENT_AMBULATORY_CARE_PROVIDER_SITE_OTHER): Payer: Medicare Other | Admitting: Family Medicine

## 2015-12-08 VITALS — BP 140/80 | HR 80 | Temp 99.3°F | Wt 220.1 lb

## 2015-12-08 DIAGNOSIS — T50905A Adverse effect of unspecified drugs, medicaments and biological substances, initial encounter: Secondary | ICD-10-CM

## 2015-12-08 DIAGNOSIS — T887XXA Unspecified adverse effect of drug or medicament, initial encounter: Secondary | ICD-10-CM | POA: Diagnosis not present

## 2015-12-08 DIAGNOSIS — I10 Essential (primary) hypertension: Secondary | ICD-10-CM | POA: Diagnosis not present

## 2015-12-08 MED ORDER — PREDNISONE 10 MG PO TABS: ORAL_TABLET | ORAL | Status: DC

## 2015-12-08 MED ORDER — CLONIDINE HCL 0.1 MG PO TABS: ORAL_TABLET | ORAL | Status: DC

## 2015-12-08 NOTE — Progress Notes (Signed)
Subjective:    Patient ID: [name], male    DOB: 1951-03-25, 65 y.o.   MRN: [id number]  HPI  [name] is a 65 year old male who presents today with his girlfriend with a chief complaint of pruritis and swelling of eyes bilaterally. Symptoms started one day ago after he reports taking trazodone which was prescribed to him by a historical provider in March 2017. He reports having this same reaction at that time and states that he "mixed up this pill bottle with his current medications". Currently, he is taking lisinopril which he has taken without adverse reactions for an "extended period of time"  He recently had a dosage increase of lisinopril 4 days ago and has taken this medication as directed.  He has since thrown away all trazadone tablets to avoid confusing this medication in the future. Pruritus and swelling of eyes have improved per patient today. No visible urticaria is present. He has taken his lisinopril today. Treatment at home includes "rubbing alcohol" on arms and back for itching which has provided limited benefit.  Review of Systems  Constitutional: Negative for fever, chills and fatigue.  HENT: Negative for congestion, postnasal drip, rhinorrhea and sneezing.   Eyes: Negative for visual disturbance.       Swelling  Respiratory: Negative for cough, shortness of breath, wheezing and stridor.   Cardiovascular: Negative for chest pain, palpitations and leg swelling.  Gastrointestinal: Negative for nausea, vomiting, abdominal pain, diarrhea and constipation.  Genitourinary: Negative for dysuria, urgency, frequency and hematuria.  Musculoskeletal: Negative for myalgias and arthralgias.  Skin:       No visible urticaria is noted.  Neurological: Negative for dizziness, light-headedness and headaches.    Past Medical History  Diagnosis Date  . Hyperlipidemia     not on any meds  . Hypertension     takes Amlodipine and Lisinopril daily  . Insomnia     takes  Ambien nightly  . History of blood transfusion     no abnormal reaction noted  . PONV (postoperative nausea and vomiting)   . Shortness of breath dyspnea     rarely but when notices he can be lying/sitting/exertion.Dr.Hochrein is aware per pt  . Headache     occasionally  . Numbness     both legs occasionally  . Arthritis   . Joint pain   . Chronic back pain   . History of gout     doesn't take any meds  . GERD (gastroesophageal reflux disease)     uses Baking Soda  . Hepatitis C   . Urinary frequency   . Urinary urgency   . Nocturia   . Glaucoma     right eye  . Prostate cancer ([name])   . GIB (gastrointestinal bleeding) 2012  . Ischemic colitis ([name]) 2012  . Stroke [name]) 2016      Social History   Social History  . Marital Status: Married    Spouse Name: N/A  . Number of Children: 2  . Years of Education: N/A   Occupational History  . Not on file.   Social History Main Topics  . Smoking status: Current Every Day Smoker -- 0.50 packs/day for 50 years    Types: Cigarettes  . Smokeless tobacco: Never Used  . Alcohol Use: 0.0 oz/week    0 Standard drinks or equivalent per week     Comment: occasionally  . Drug Use: No  . Sexual Activity: Yes   Other Topics Concern  .  Not on file   Social History Narrative   One living child .  One murdered.  Lives with girlfriend.      Past Surgical History  Procedure Laterality Date  . Appendectomy    . Small intestine surgery    . Multiple abdominal surgeries      total of 13  . Prostatectomy    . Colonoscopy    . Esophagogastroduodenoscopy    . Inguinal hernia repair Right 11/04/2014    Procedure: RIGHT INGUINAL HERNIA REPAIR WITH MESH;  Surgeon: Jackolyn Confer, MD;  Location: [place];  Service: General;  Laterality: Right;  . Mass excision N/A 11/04/2014    Procedure: REMOVAL OF RIGHT GROIN SOFT TISSUE MASS;  Surgeon: Jackolyn Confer, MD;  Location: [place];  Service: General;  Laterality: N/A;  .  Esophagogastroduodenoscopy N/A 10/26/2015    Procedure: ESOPHAGOGASTRODUODENOSCOPY (EGD);  Surgeon: Doran Stabler, MD;  Location: [facility] ENDOSCOPY;  Service: Endoscopy;  Laterality: N/A;  . Colonoscopy N/A 10/26/2015    Procedure: COLONOSCOPY;  Surgeon: Doran Stabler, MD;  Location: [facility] ENDOSCOPY;  Service: Endoscopy;  Laterality: N/A;    Family History  Problem Relation Age of Onset  . Alzheimer's disease Father   . Cancer Mother     Lymph node  . Heart failure Brother 45    Transplant 13 years ago  . CAD Brother   . Heart disease Sister 84    MI    Allergies  Allergen Reactions  . Penicillins Anaphylaxis  . Sudafed [Pseudoephedrine] Other (See Comments)    Heart "races"    Current Outpatient Prescriptions on File Prior to Visit  Medication Sig Dispense Refill  . acetaminophen-codeine (TYLENOL #2) 300-15 MG tablet Take 1 tablet by mouth 4 (four) times daily.    Marland Kitchen buPROPion (WELLBUTRIN XL) 300 MG 24 hr tablet Take 1 tablet (300 mg total) by mouth daily. 90 tablet 2  . cyclobenzaprine (FLEXERIL) 10 MG tablet Take 10 mg by mouth at bedtime.  1  . dexlansoprazole (DEXILANT) 60 MG capsule Take 60 mg by mouth daily.    . diclofenac sodium (VOLTAREN) 1 % GEL Apply 2 g topically 3 (three) times daily as needed (Severe knee pain). 100 g 0  . ferrous sulfate 325 (65 FE) MG tablet Take 1 tablet (325 mg total) by mouth daily with breakfast. 30 tablet 1  . hydrochlorothiazide (HYDRODIURIL) 25 MG tablet Take 1 tablet (25 mg total) by mouth daily. 30 tablet 0  . HYDROcodone-acetaminophen (NORCO/VICODIN) 5-325 MG tablet Take 1 tablet by mouth every 6 (six) hours as needed for severe pain. 15 tablet 0  . isosorbide mononitrate (IMDUR) 60 MG 24 hr tablet Take 1 tablet (60 mg total) by mouth daily. 30 tablet 0  . lisinopril (PRINIVIL,ZESTRIL) 10 MG tablet Take 10 mg by mouth daily.  3  . lisinopril (PRINIVIL,ZESTRIL) 40 MG tablet Take 1 tablet (40 mg total) by mouth daily. 90 tablet 3  . LORazepam  (ATIVAN) 0.5 MG tablet Take 1 tablet (0.5 mg total) by mouth every 8 (eight) hours as needed for anxiety. 10 tablet 0  . nicotine (NICODERM CQ - DOSED IN MG/24 HOURS) 14 mg/24hr patch Place 1 patch (14 mg total) onto the skin daily. 28 patch 0  . pregabalin (LYRICA) 150 MG capsule Take 150 mg by mouth 2 (two) times daily.    Marland Kitchen senna-docusate (SENOKOT-S) 8.6-50 MG tablet Take 1 tablet by mouth at bedtime as needed for moderate constipation.    . solifenacin (  VESICARE) 5 MG tablet Take 5 mg by mouth at bedtime.     Marland Kitchen zolpidem (AMBIEN) 10 MG tablet Take 10 mg by mouth at bedtime.  3   No current facility-administered medications on file prior to visit.    BP 140/80 mmHg  Pulse 80  Temp(Src) 99.3 F (37.4 C) (Oral)  Wt 220 lb 1.6 oz (99.837 kg)  SpO2 98%        Objective:   Physical Exam  Constitutional: He is oriented to person, place, and time. He appears well-developed and well-nourished.  Eyes: Pupils are equal, round, and reactive to light.  Mild edema noted around eyes bilaterally. No edema of lips or tongue present  Neck: Neck supple.  Cardiovascular: Normal rate and regular rhythm.   Pulmonary/Chest: Effort normal and breath sounds normal. He has no wheezes. He has no rales.  Lymphadenopathy:    He has no cervical adenopathy.  Neurological: He is alert and oriented to person, place, and time.  Skin: Skin is warm and dry.  No visible urticaria is noted.   Psychiatric: He has a normal mood and affect. His behavior is normal. Judgment and thought content normal.        Assessment & Plan:  1. Medication reaction, initial encounter Exam and history support likely adverse medication reaction is related to trazodone as patient reports this has been an issue previously. Symptom of urticaria with angioedema and long term use of lisinopril likely supports trazadone reaction.  While this reaction is likely, lisinopril cannot be ruled out. Advised patient to discontinue any future  use of trazodone and hold lisinopril with monitoring of blood pressure until he can see his provider in 2 days. Also, blood pressure monitoring was discussed with documentation of readings with signs and symptoms that require immediate medical attention.  - predniSONE (DELTASONE) 10 MG tablet; Take 4 tablets by mouth for 2 days, 3 tablets by mouth for 2 days, 2 tablets by mouth for 2 days, and one tablet by mouth for 2 days.  Dispense: 20 tablet; Refill:  0  2. Essential hypertension Advised patient to monitor blood pressure and take Clonidine BID while he is holding the lisinopril until PCP follow up in 2 days. New prescription of clonidine was sent to a different pharmacy per patient request.   - cloNIDine (CATAPRES) 0.1 MG tablet; Take 0.1 mg daily for blood pressure >160/90  Dispense: 90 tablet; Refill: 3  Advised patient and his girlfriend to keep follow up appointment with PCP and bring BP readings with them to the clinic. Patient verbalized understanding and agreed with plan.  Delano Metz, FNP-C

## 2015-12-08 NOTE — Patient Instructions (Addendum)
Please discontinue Trazodone and also hold lisinopril until you follow up with your provider in 2 days. Please take Clonidine two times daily while you are holding the lisinopril. Also, please monitor your BP and document readings for your provider and your visit in 2 days.   Signs and symptoms that require urgent medical attention include shortness of breath, chest pain, palpitations, visual disturbances, headaches, nosebleeds, and numbness/tingling. If symptoms do not improve or worsen with discontinuation of medication and prednisone therapy, please seek medical attention immediately.  Drug Allergy Allergic reactions to medicines are common. Some allergic reactions are mild. A delayed type of drug allergy that occurs 1 week or more after exposure to a medicine or vaccine is called serum sickness. A life-threatening, sudden (acute) allergic reaction that involves the whole body is called anaphylaxis. CAUSES  "True" drug allergies occur when there is an allergic reaction to a medicine. This is caused by overactivity of the immune system. First, the body becomes sensitized. The immune system is triggered by your first exposure to the medicine. Following this first exposure, future exposure to the same medicine may be life-threatening. Almost any medicine can cause an allergic reaction. Common ones are:  Penicillin.  Sulfonamides (sulfa drugs).  Local anesthetics.  X-ray dyes that contain iodine. SYMPTOMS  Common symptoms of a minor allergic reaction are:  Swelling around the mouth.  An itchy red rash or hives.  Vomiting or diarrhea. Anaphylaxis can cause swelling of the mouth and throat. This makes it difficult to breathe and swallow. Severe reactions can be fatal within seconds, even after exposure to only a trace amount of the drug that causes the reaction. HOME CARE INSTRUCTIONS  If you are unsure of what caused your reaction, write down:  The names of the medicines you took.  How  much medicine you took.  How you took the medicine, such as whether you took a pill, injected the medicine, or applied it to your skin.  All of the things you ate and drank.  The date and time of your reaction.  The symptoms of the reaction.  You may want to follow up with an allergy specialist after the reaction has cleared in order to be tested to confirm the allergy. It is important to confirm that your reaction is an allergy, not just a side effect to the medicine. If you have a true allergy to a medicine, this may prevent that medicine and related medicines from being given to you when you are very ill.  If you have hives or a rash:  Take medicines as directed by your caregiver.  You may use an over-the-counter antihistamine (diphenhydramine) as needed.  Apply cold compresses to the skin or take baths in cool water. Avoid hot baths or showers.  If you are severely allergic:  Continuous observation after a severe reaction may be needed. Hospitalization is often required.  Wear a medical alert bracelet or necklace stating your allergy.  You and your family must learn how to use an anaphylaxis kit or give an epinephrine injection to temporarily treat an emergency allergic reaction. If you have had a severe reaction, always carry your epinephrine injection or anaphylaxis kit with you. This can be lifesaving if you have a severe reaction.  Do not drive or perform tasks after treatment until the medicines used to treat your reaction have worn off, or until your caregiver says it is okay.  If you have a drug allergy that was confirmed by your health care provider:  Carry information about the drug allergy with you at all times.  Always check with a pharmacist before taking any over-the-counter medicine. SEEK MEDICAL CARE IF:   You think you had an allergic reaction. Symptoms usually start within 30 minutes after exposure.  Symptoms are getting worse rather than better.  You  develop new symptoms.  The symptoms that brought you to your caregiver return. SEEK IMMEDIATE MEDICAL CARE IF:   You have swelling of the mouth, difficulty breathing, or wheezing.  You have a tight feeling in your chest or throat.  You develop hives, swelling, or itching all over your body.  You develop severe vomiting or diarrhea.  You feel faint or pass out. This is an emergency. Use your epinephrine injection or anaphylaxis kit as you have been instructed. Call for emergency medical help. Even if you improve after the injection, you need to be examined at a hospital emergency department. MAKE SURE YOU:   Understand these instructions.  Will watch your condition.  Will get help right away if you are not doing well or get worse.   This information is not intended to replace advice given to you by your health care provider. Make sure you discuss any questions you have with your health care provider.   Document Released: 07/05/2005 Document Revised: 07/26/2014 Document Reviewed: 02/04/2015 Elsevier Interactive Patient Education [employer].

## 2015-12-08 NOTE — Telephone Encounter (Signed)
Called to discontinue medication sent to [name] (Clonopin) Patient in today and medication never picked up. Medication sent to a different pharmacy per patient request.

## 2015-12-08 NOTE — Progress Notes (Signed)
Pre visit review using our clinic tool,if applicable. No additional management support is needed unless otherwise documented below in the visit note.  

## 2015-12-10 ENCOUNTER — Encounter: Payer: Self-pay | Admitting: Adult Health

## 2015-12-10 ENCOUNTER — Ambulatory Visit (INDEPENDENT_AMBULATORY_CARE_PROVIDER_SITE_OTHER): Payer: Medicare Other | Admitting: Adult Health

## 2015-12-10 VITALS — BP 152/90 | Temp 98.5°F | Ht 69.5 in | Wt 223.0 lb

## 2015-12-10 DIAGNOSIS — I10 Essential (primary) hypertension: Secondary | ICD-10-CM

## 2015-12-10 DIAGNOSIS — T7840XS Allergy, unspecified, sequela: Secondary | ICD-10-CM | POA: Diagnosis not present

## 2015-12-10 NOTE — Progress Notes (Signed)
Subjective:    Patient ID: [name], male    DOB: 09/09/1950, 64 y.o.   MRN: [id number]  HPI  65 year old male who who  has a past medical history of Hyperlipidemia; Hypertension; Insomnia; History of blood transfusion; PONV (postoperative nausea and vomiting); Shortness of breath dyspnea; Headache; Numbness; Arthritis; Joint pain; Chronic back pain; History of gout; GERD (gastroesophageal reflux disease); Hepatitis C; Urinary frequency; Urinary urgency; Nocturia; Glaucoma; Prostate cancer ([place]); GIB (gastrointestinal bleeding) (2012); Ischemic colitis ([place]) (2012); and Stroke [facility]) (2016 ).  Presents to the office today for follow up after being seen two days ago by another provider for allergic reaction. He presented that day  with his girlfriend with a chief complaint of pruritis and swelling of eyes bilaterally. Symptoms started one day ago after he reports taking trazodone which was prescribed to him by a historical provider in March 2017. He reports having this same reaction at that time and states that he "mixed up this pill bottle with his current medications".  Since he was recently started back on Lisinopril he was asked to stop that medication until he was seen today.   His symptoms have improved and he is convinced that the swelling was from Trazadone. He has since flushed the trazadone down the toilet.   He denies any SOB, trouble breathing or additional swelling.    His blood pressure seems to be more well controlled since starting back on lisinopril. When I first say him, 5 days ago his BP was 198/100. Currently in the office it is 154/90. When he came in for the allergic reaction his BP was 140/80  He reports feeling " a lot better" he is no longer waking up with headaches and does not feel as " swimmy headed".   He has been walking without a cane and is doing well with that. Denies any falls   Review of Systems  Constitutional: Negative.   Respiratory: Negative.     Cardiovascular: Negative.   Musculoskeletal: Positive for back pain, arthralgias and gait problem. Negative for joint swelling.  Skin: Negative.   Neurological: Negative.    Past Medical History  Diagnosis Date  . Hyperlipidemia     not on any meds  . Hypertension     takes Amlodipine and Lisinopril daily  . Insomnia     takes Ambien nightly  . History of blood transfusion     no abnormal reaction noted  . PONV (postoperative nausea and vomiting)   . Shortness of breath dyspnea     rarely but when notices he can be lying/sitting/exertion.Dr.Hochrein is aware per pt  . Headache     occasionally  . Numbness     both legs occasionally  . Arthritis   . Joint pain   . Chronic back pain   . History of gout     doesn't take any meds  . GERD (gastroesophageal reflux disease)     uses Baking Soda  . Hepatitis C   . Urinary frequency   . Urinary urgency   . Nocturia   . Glaucoma     right eye  . Prostate cancer ([place])   . GIB (gastrointestinal bleeding) 2012  . Ischemic colitis ([place]) 2012  . Stroke [facility]) 2016     Social History   Social History  . Marital Status: Married    Spouse Name: N/A  . Number of Children: 2  . Years of Education: N/A   Occupational History  . Not  on file.   Social History Main Topics  . Smoking status: Current Every Day Smoker -- 0.50 packs/day for 50 years    Types: Cigarettes  . Smokeless tobacco: Never Used  . Alcohol Use: 0.0 oz/week    0 Standard drinks or equivalent per week     Comment: occasionally  . Drug Use: No  . Sexual Activity: Yes   Other Topics Concern  . Not on file   Social History Narrative   One living child .  One murdered.  Lives with girlfriend.      Past Surgical History  Procedure Laterality Date  . Appendectomy    . Small intestine surgery    . Multiple abdominal surgeries      total of 13  . Prostatectomy    . Colonoscopy    . Esophagogastroduodenoscopy    . Inguinal hernia repair Right 11/04/2014     Procedure: RIGHT INGUINAL HERNIA REPAIR WITH MESH;  Surgeon: Jackolyn Confer, MD;  Location: [place];  Service: General;  Laterality: Right;  . Mass excision N/A 11/04/2014    Procedure: REMOVAL OF RIGHT GROIN SOFT TISSUE MASS;  Surgeon: Jackolyn Confer, MD;  Location: [place];  Service: General;  Laterality: N/A;  . Esophagogastroduodenoscopy N/A 10/26/2015    Procedure: ESOPHAGOGASTRODUODENOSCOPY (EGD);  Surgeon: Doran Stabler, MD;  Location: [facility] ENDOSCOPY;  Service: Endoscopy;  Laterality: N/A;  . Colonoscopy N/A 10/26/2015    Procedure: COLONOSCOPY;  Surgeon: Doran Stabler, MD;  Location: [facility] ENDOSCOPY;  Service: Endoscopy;  Laterality: N/A;    Family History  Problem Relation Age of Onset  . Alzheimer's disease Father   . Cancer Mother     Lymph node  . Heart failure Brother 45    Transplant 13 years ago  . CAD Brother   . Heart disease Sister 60    MI    Allergies  Allergen Reactions  . Penicillins Anaphylaxis  . Sudafed [Pseudoephedrine] Other (See Comments)    Heart "races"  . Trazodone And Nefazodone Swelling    Current Outpatient Prescriptions on File Prior to Visit  Medication Sig Dispense Refill  . acetaminophen-codeine (TYLENOL #2) 300-15 MG tablet Take 1 tablet by mouth 4 (four) times daily.    Marland Kitchen buPROPion (WELLBUTRIN XL) 300 MG 24 hr tablet Take 1 tablet (300 mg total) by mouth daily. 90 tablet 2  . cloNIDine (CATAPRES) 0.1 MG tablet Take 0.1 mg daily for blood pressure >160/90 90 tablet 3  . cyclobenzaprine (FLEXERIL) 10 MG tablet Take 10 mg by mouth at bedtime.  1  . dexlansoprazole (DEXILANT) 60 MG capsule Take 60 mg by mouth daily.    . diclofenac sodium (VOLTAREN) 1 % GEL Apply 2 g topically 3 (three) times daily as needed (Severe knee pain). 100 g 0  . ferrous sulfate 325 (65 FE) MG tablet Take 1 tablet (325 mg total) by mouth daily with breakfast. 30 tablet 1  . hydrochlorothiazide (HYDRODIURIL) 25 MG tablet Take 1 tablet (25 mg total) by mouth daily. 30  tablet 0  . HYDROcodone-acetaminophen (NORCO/VICODIN) 5-325 MG tablet Take 1 tablet by mouth every 6 (six) hours as needed for severe pain. 15 tablet 0  . isosorbide mononitrate (IMDUR) 60 MG 24 hr tablet Take 1 tablet (60 mg total) by mouth daily. 30 tablet 0  . lisinopril (PRINIVIL,ZESTRIL) 10 MG tablet Take 10 mg by mouth daily.  3  . lisinopril (PRINIVIL,ZESTRIL) 40 MG tablet Take 1 tablet (40 mg total) by mouth daily. [place]  tablet 3  . LORazepam (ATIVAN) 0.5 MG tablet Take 1 tablet (0.5 mg total) by mouth every 8 (eight) hours as needed for anxiety. 10 tablet 0  . nicotine (NICODERM CQ - DOSED IN MG/24 HOURS) 14 mg/24hr patch Place 1 patch (14 mg total) onto the skin daily. 28 patch 0  . predniSONE (DELTASONE) 10 MG tablet Take 4 tablets by mouth for 2 days, 3 tablets by mouth for 2 days, 2 tablets by mouth for 2 days, and one tablet by mouth for 2 days. 20 tablet 0  . pregabalin (LYRICA) 150 MG capsule Take 150 mg by mouth 2 (two) times daily.    Marland Kitchen senna-docusate (SENOKOT-S) 8.6-50 MG tablet Take 1 tablet by mouth at bedtime as needed for moderate constipation.    . solifenacin (VESICARE) 5 MG tablet Take 5 mg by mouth at bedtime.     Marland Kitchen zolpidem (AMBIEN) 10 MG tablet Take 10 mg by mouth at bedtime.  3   No current facility-administered medications on file prior to visit.    BP 152/90 mmHg  Temp(Src) 98.5 F (36.9 C) (Oral)  Ht 5' 9.5" (1.765 m)  Wt 223 lb (101.152 kg)  BMI 32.47 kg/m2       Objective:   Physical Exam  Constitutional: He is oriented to person, place, and time. He appears well-developed and well-nourished. No distress.  Cardiovascular: Normal rate, regular rhythm, normal heart sounds and intact distal pulses.  Exam reveals no gallop and no friction rub.   No murmur heard. Pulmonary/Chest: Effort normal and breath sounds normal. No respiratory distress. He has no wheezes. He has no rales. He exhibits no tenderness.  Neurological: He is alert and oriented to person,  place, and time. He has normal reflexes. He displays normal reflexes. No cranial nerve deficit. He exhibits normal muscle tone. Coordination normal.  Skin: Skin is warm and dry. No rash noted. He is not diaphoretic. No erythema. No pallor.  No noticeable swelling   Psychiatric: He has a normal mood and affect. His behavior is normal. Judgment and thought content normal.  Nursing note and vitals reviewed.         Assessment & Plan:  1. Essential hypertension - Much improved.  - Ok to restart lisinopril. If he has swelling or angio edema, stop medication and report to the ER - Follow up for CPE   2. Allergic reaction, sequela - Resolved.  - Follow up with any additional swelling   Dorothyann Peng, NP

## 2015-12-10 NOTE — Patient Instructions (Signed)
It was great seeing you again and I am happy that you are feeling better.   You can cancel your appointment tomorrow.   Schedule your physical   Follow up with me regarding your knee pain if you need to   Continue to exercise and work on eating healthy

## 2015-12-11 ENCOUNTER — Ambulatory Visit: Payer: Medicare Other | Admitting: Adult Health

## 2015-12-25 ENCOUNTER — Other Ambulatory Visit (INDEPENDENT_AMBULATORY_CARE_PROVIDER_SITE_OTHER): Payer: Medicare Other

## 2015-12-25 ENCOUNTER — Ambulatory Visit (INDEPENDENT_AMBULATORY_CARE_PROVIDER_SITE_OTHER): Payer: Medicare Other | Admitting: Gastroenterology

## 2015-12-25 ENCOUNTER — Encounter: Payer: Self-pay | Admitting: Gastroenterology

## 2015-12-25 VITALS — BP 158/80 | HR 80 | Ht 68.0 in | Wt 226.1 lb

## 2015-12-25 DIAGNOSIS — D509 Iron deficiency anemia, unspecified: Secondary | ICD-10-CM

## 2015-12-25 LAB — CBC WITH DIFFERENTIAL/PLATELET
Basophils Absolute: 0 10*3/uL (ref 0.0–0.1)
Basophils Relative: 0.4 % (ref 0.0–3.0)
Eosinophils Absolute: 0.1 10*3/uL (ref 0.0–0.7)
Eosinophils Relative: 2.2 % (ref 0.0–5.0)
HCT: 35.3 % — ABNORMAL LOW (ref 39.0–52.0)
Hemoglobin: 11 g/dL — ABNORMAL LOW (ref 13.0–17.0)
Lymphocytes Relative: 27.1 % (ref 12.0–46.0)
Lymphs Abs: 1.8 10*3/uL (ref 0.7–4.0)
MCHC: 31 g/dL (ref 30.0–36.0)
MCV: 77.4 fl — ABNORMAL LOW (ref 78.0–100.0)
Monocytes Absolute: 0.7 10*3/uL (ref 0.1–1.0)
Monocytes Relative: 10.6 % (ref 3.0–12.0)
Neutro Abs: 4 10*3/uL (ref 1.4–7.7)
Neutrophils Relative %: 59.7 % (ref 43.0–77.0)
Platelets: 340 10*3/uL (ref 150.0–400.0)
RBC: 4.57 Mil/uL (ref 4.22–5.81)
RDW: 23.3 % — ABNORMAL HIGH (ref 11.5–15.5)
WBC: 6.7 10*3/uL (ref 4.0–10.5)

## 2015-12-25 NOTE — Progress Notes (Signed)
[name] Progress Note  Chief Complaint: Iron deficiency anemia  Subjective History:   My hospital consult on 10/25/15: "[name] is a 65 y.o. male  admitted last evening through the emergency room after being sent by his PCP for anemia. He had a hemoglobin of 6.2 and was noted to be Hemoccult positive. This was down from a hemoglobin documented at 13 in November 2016. Patient had admission here in November 2016 with a CVA and has been maintained on aspirin 325 mg by mouth daily. Other medical problems include hypertension, history of hepatitis C, remote history of bleeding ulcers for which she underwent surgery as a teenager but does not believe they removed any portion of his stomach. Review of records show that this patient was transfused 2 units of packed RBCs as an outpatient in December 2016 and then had 2 units of packed RBCs on 10/17/2015 both ordered by his PCP. There are no interval CBCs available in this system. Vision says he has been noticing intermittent black stools over the past several months. He does not have a bowel movement daily and sometimes goes 2 or 3 days between bowel movements. Stools have been black or recently. He has not noticed any bright red blood. He denies any dysphagia or odynophagia no complaints of abdominal pain appetite has been fine weight has been stable. He has had numerous abdominal surgeries. Family history is pertinent for colon cancer in his sister. Patient apparently had colonoscopy per Dr. Lajoyce Corners in 2007 which was an incomplete exam due to poor prep but he did not have repeat exam. Patient received 2 units of packed RBCs last evening and hemoglobin is 7.5 this morning Serum iron 23 TIBC 516 iron saturation of 4"  Neg EGD and colon during that hospital stay  As before, his history is quite limited. We have no records from his local health clinic, but [name] reports he is had another transfusion since leaving the hospital in early April. His stool  turned dark on iron tablets, but he has no black tarry stool or bright red blood per rectum. He denies abdominal pain or weight loss.  ROS: Cardiovascular:  no chest pain Respiratory: no dyspnea  The patient's Past Medical, Family and Social History were reviewed and are on file in the EMR.  Objective:  Med list reviewed  Vital signs in last 24 hrs: Filed Vitals:   12/25/15 1558 12/25/15 1649  BP: 164/80 158/80  Pulse: 80     Physical Exam   HEENT: sclera anicteric, oral mucosa moist without lesions  Neck: supple, no thyromegaly, JVD or lymphadenopathy  Cardiac: RRR without murmurs, S1S2 heard, no peripheral edema  Pulm: clear to auscultation bilaterally, normal RR and effort noted  Abdomen: soft, No tenderness, with active bowel sounds. No guarding or palpable hepatosplenomegaly. Multiple surgical scars as before  Skin; warm and dry, no jaundice or rash   @ASSESSMENTPLANBEGIN @ Assessment: Encounter Diagnosis  Name Primary?  Marland Kitchen Anemia, iron deficiency Yes   Probable occult small bowel blood loss from combination aspirin and NSAID therapy. He has since stopped both of those meds.   Plan: Check CBC and further plan to follow.  [name] III

## 2015-12-25 NOTE — Patient Instructions (Addendum)
If you are age 67 or older, your body mass index should be between 23-30. Your Body mass index is 34.39 kg/(m^2). If this is out of the aforementioned range listed, please consider follow up with your Primary Care Provider.  If you are age 75 or younger, your body mass index should be between 19-25. Your Body mass index is 34.39 kg/(m^2). If this is out of the aformentioned range listed, please consider follow up with your Primary Care Provider.   Your physician has requested that you go to the basement for the following lab work before leaving today: CBC  Today your blood pressure was elevated. Please follow up with your Primary Care Provider for blood pressure management.  Thank you for choosing [facility]  Dr Wilfrid Lund III

## 2016-01-15 ENCOUNTER — Ambulatory Visit: Payer: Medicare Other | Admitting: Adult Health

## 2016-01-15 DIAGNOSIS — Z0289 Encounter for other administrative examinations: Secondary | ICD-10-CM

## 2016-01-21 ENCOUNTER — Ambulatory Visit (INDEPENDENT_AMBULATORY_CARE_PROVIDER_SITE_OTHER): Payer: Medicare Other | Admitting: Adult Health

## 2016-01-21 ENCOUNTER — Encounter: Payer: Self-pay | Admitting: Adult Health

## 2016-01-21 VITALS — BP 142/58 | Temp 98.4°F | Ht 69.5 in | Wt 221.0 lb

## 2016-01-21 DIAGNOSIS — F329 Major depressive disorder, single episode, unspecified: Secondary | ICD-10-CM | POA: Diagnosis not present

## 2016-01-21 DIAGNOSIS — M25562 Pain in left knee: Secondary | ICD-10-CM | POA: Diagnosis not present

## 2016-01-21 DIAGNOSIS — Z760 Encounter for issue of repeat prescription: Secondary | ICD-10-CM | POA: Diagnosis not present

## 2016-01-21 DIAGNOSIS — F17200 Nicotine dependence, unspecified, uncomplicated: Secondary | ICD-10-CM | POA: Diagnosis not present

## 2016-01-21 MED ORDER — ZOLPIDEM TARTRATE 10 MG PO TABS: 10.0000 mg | ORAL_TABLET | Freq: Every day | ORAL | Status: DC

## 2016-01-21 MED ORDER — METHYLPREDNISOLONE ACETATE 80 MG/ML IJ SUSP
80.0000 mg | Freq: Once | INTRAMUSCULAR | Status: AC
  Administered 2016-01-21: 80 mg via INTRA_ARTICULAR

## 2016-01-21 MED ORDER — METHYLPREDNISOLONE ACETATE 80 MG/ML IJ SUSP: 80.0000 mg | Freq: Once | INTRAMUSCULAR | Status: DC

## 2016-01-21 MED ORDER — BUPROPION HCL ER (XL) 300 MG PO TB24: 300.0000 mg | ORAL_TABLET | Freq: Every day | ORAL | Status: DC

## 2016-01-21 NOTE — Progress Notes (Signed)
Subjective:    Patient ID: [name], male    DOB: Jun 06, 1951, 65 y.o.   MRN: [id number]  HPI  66 year old male who presents to the office for left knee pain x " years". He is having more difficulty with the pain as well as his knee " locking up" when he sits for extended periods of time. Denies any swelling, redness or warmth to the left knee. Denies any recent trauma.   He also needs his Ambien prescription refilled.   Review of Systems  Constitutional: Positive for activity change.  Respiratory: Negative.   Cardiovascular: Negative.   Musculoskeletal: Positive for myalgias and arthralgias. Negative for back pain, joint swelling and gait problem.  Skin: Negative.   All other systems reviewed and are negative.  Past Medical History  Diagnosis Date  . Hyperlipidemia     not on any meds  . Hypertension     takes Amlodipine and Lisinopril daily  . Insomnia     takes Ambien nightly  . History of blood transfusion     no abnormal reaction noted  . PONV (postoperative nausea and vomiting)   . Shortness of breath dyspnea     rarely but when notices he can be lying/sitting/exertion.Dr.Hochrein is aware per pt  . Headache     occasionally  . Numbness     both legs occasionally  . Arthritis   . Joint pain   . Chronic back pain   . History of gout     doesn't take any meds  . GERD (gastroesophageal reflux disease)     uses Baking Soda  . Hepatitis C   . Urinary frequency   . Urinary urgency   . Nocturia   . Glaucoma     right eye  . Prostate cancer ([place])   . GIB (gastrointestinal bleeding) 2012  . Ischemic colitis ([place]) 2012  . Stroke [facility]) 2016     Social History   Social History  . Marital Status: Married    Spouse Name: N/A  . Number of Children: 2  . Years of Education: N/A   Occupational History  . Not on file.   Social History Main Topics  . Smoking status: Current Every Day Smoker -- 0.50 packs/day for 50 years    Types: Cigarettes  .  Smokeless tobacco: Never Used  . Alcohol Use: 0.0 oz/week    0 Standard drinks or equivalent per week     Comment: occasionally  . Drug Use: No  . Sexual Activity: Yes   Other Topics Concern  . Not on file   Social History Narrative   One living child .  One murdered.  Lives with girlfriend.      Past Surgical History  Procedure Laterality Date  . Appendectomy    . Small intestine surgery    . Multiple abdominal surgeries      total of 13  . Prostatectomy    . Colonoscopy    . Esophagogastroduodenoscopy    . Inguinal hernia repair Right 11/04/2014    Procedure: RIGHT INGUINAL HERNIA REPAIR WITH MESH;  Surgeon: Jackolyn Confer, MD;  Location: [place];  Service: General;  Laterality: Right;  . Mass excision N/A 11/04/2014    Procedure: REMOVAL OF RIGHT GROIN SOFT TISSUE MASS;  Surgeon: Jackolyn Confer, MD;  Location: [place];  Service: General;  Laterality: N/A;  . Esophagogastroduodenoscopy N/A 10/26/2015    Procedure: ESOPHAGOGASTRODUODENOSCOPY (EGD);  Surgeon: Doran Stabler, MD;  Location: [facility]  ENDOSCOPY;  Service: Endoscopy;  Laterality: N/A;  . Colonoscopy N/A 10/26/2015    Procedure: COLONOSCOPY;  Surgeon: Doran Stabler, MD;  Location: [facility] ENDOSCOPY;  Service: Endoscopy;  Laterality: N/A;    Family History  Problem Relation Age of Onset  . Alzheimer's disease Father   . Cancer Mother     Lymph node  . Heart failure Brother 45    Transplant 13 years ago  . CAD Brother   . Heart disease Sister 24    MI    Allergies  Allergen Reactions  . Penicillins Anaphylaxis  . Sudafed [Pseudoephedrine] Other (See Comments)    Heart "races"  . Trazodone And Nefazodone Swelling    Current Outpatient Prescriptions on File Prior to Visit  Medication Sig Dispense Refill  . acetaminophen-codeine (TYLENOL #2) 300-15 MG tablet Take 1 tablet by mouth 4 (four) times daily.    . cloNIDine (CATAPRES) 0.1 MG tablet Take 0.1 mg daily for blood pressure >160/90 90 tablet 3  .  cyclobenzaprine (FLEXERIL) 10 MG tablet Take 10 mg by mouth at bedtime.  1  . dexlansoprazole (DEXILANT) 60 MG capsule Take 60 mg by mouth daily.    . diclofenac sodium (VOLTAREN) 1 % GEL Apply 2 g topically 3 (three) times daily as needed (Severe knee pain). 100 g 0  . ferrous sulfate 325 (65 FE) MG tablet Take 1 tablet (325 mg total) by mouth daily with breakfast. 30 tablet 1  . hydrochlorothiazide (HYDRODIURIL) 25 MG tablet Take 1 tablet (25 mg total) by mouth daily. 30 tablet 0  . HYDROcodone-acetaminophen (NORCO/VICODIN) 5-325 MG tablet Take 1 tablet by mouth every 6 (six) hours as needed for severe pain. 15 tablet 0  . isosorbide mononitrate (IMDUR) 60 MG 24 hr tablet Take 1 tablet (60 mg total) by mouth daily. 30 tablet 0  . lisinopril (PRINIVIL,ZESTRIL) 40 MG tablet Take 1 tablet (40 mg total) by mouth daily. 90 tablet 3  . LORazepam (ATIVAN) 0.5 MG tablet Take 1 tablet (0.5 mg total) by mouth every 8 (eight) hours as needed for anxiety. 10 tablet 0  . nicotine (NICODERM CQ - DOSED IN MG/24 HOURS) 14 mg/24hr patch Place 1 patch (14 mg total) onto the skin daily. 28 patch 0  . predniSONE (DELTASONE) 10 MG tablet Take 4 tablets by mouth for 2 days, 3 tablets by mouth for 2 days, 2 tablets by mouth for 2 days, and one tablet by mouth for 2 days. 20 tablet 0  . pregabalin (LYRICA) 150 MG capsule Take 150 mg by mouth 2 (two) times daily.    Marland Kitchen senna-docusate (SENOKOT-S) 8.6-50 MG tablet Take 1 tablet by mouth at bedtime as needed for moderate constipation.    . solifenacin (VESICARE) 5 MG tablet Take 5 mg by mouth at bedtime.      No current facility-administered medications on file prior to visit.    BP 142/58 mmHg  Temp(Src) 98.4 F (36.9 C) (Oral)  Ht 5' 9.5" (1.765 m)  Wt 221 lb (100.245 kg)  BMI 32.18 kg/m2       Objective:   Physical Exam  Constitutional: He is oriented to person, place, and time. He appears well-developed and well-nourished. No distress.  Cardiovascular:  Normal rate, regular rhythm, normal heart sounds and intact distal pulses.  Exam reveals no gallop and no friction rub.   No murmur heard. Pulmonary/Chest: Effort normal and breath sounds normal. No respiratory distress. He has no wheezes. He has no rales. He exhibits no tenderness.  Musculoskeletal: He exhibits tenderness (left knee). He exhibits no edema.       Left knee: He exhibits decreased range of motion. He exhibits no swelling, no effusion, no ecchymosis, no deformity, no erythema and no bony tenderness. Tenderness found. No medial joint line, no lateral joint line, no MCL, no LCL and no patellar tendon tenderness noted.  Neurological: He is alert and oriented to person, place, and time.  Skin: Skin is warm and dry. No rash noted. He is not diaphoretic. No erythema. No pallor.  Psychiatric: He has a normal mood and affect. His behavior is normal. Judgment and thought content normal.  Nursing note and vitals reviewed.     Assessment & Plan:  1. Left knee pain Left knee injection Verbal consent obtained and verified. Sterile betadine prep. Furthur cleansed with alcohol. Topical analgesic spray: Ethyl chloride. Joint: left knee subacromial injection Approached in typical fashion with: superolateral approach Completed without difficulty Meds: 2cc lidocaine 2% no epi, 1 cc depomedrol 80mg /cc Needle:1.5 inch 25 gauge Aftercare instructions and Red flags advised. - DG Knee 1-2 Views Left; Future - methylPREDNISolone acetate (DEPO-MEDROL) injection 80 mg; Inject 1 mL (80 mg total) into the articular space once. - Ice - Consider referral to ortho 2. Medication refill - buPROPion (WELLBUTRIN XL) 300 MG 24 hr tablet; Take 1 tablet (300 mg total) by mouth daily.  Dispense: 90 tablet; Refill: 2 - zolpidem (AMBIEN) 10 MG tablet; Take 1 tablet (10 mg total) by mouth at bedtime.  Dispense: 30 tablet; Refill: 0   Dorothyann Peng, NP

## 2016-01-21 NOTE — Patient Instructions (Signed)
It was great seeing you again   As discussed, the steroid injection may take three days to fully work.   Please go to the [place] office for your x ray of the knee  Follow up if no improvement

## 2016-01-23 ENCOUNTER — Ambulatory Visit (INDEPENDENT_AMBULATORY_CARE_PROVIDER_SITE_OTHER)
Admission: RE | Admit: 2016-01-23 | Discharge: 2016-01-23 | Disposition: A | Discharge status: Home / Self Care | Payer: Medicare Other | Source: Ambulatory Visit | Attending: Adult Health | Admitting: Adult Health

## 2016-01-23 DIAGNOSIS — M25562 Pain in left knee: Secondary | ICD-10-CM

## 2016-01-29 ENCOUNTER — Other Ambulatory Visit: Payer: Self-pay | Admitting: Adult Health

## 2016-01-29 DIAGNOSIS — M25561 Pain in right knee: Secondary | ICD-10-CM

## 2016-01-29 DIAGNOSIS — M25562 Pain in left knee: Principal | ICD-10-CM

## 2016-02-03 ENCOUNTER — Ambulatory Visit: Payer: Medicare Other | Admitting: Neurology

## 2016-02-03 ENCOUNTER — Telehealth: Payer: Self-pay | Admitting: Adult Health

## 2016-02-03 NOTE — Telephone Encounter (Signed)
Please advise 

## 2016-02-03 NOTE — Telephone Encounter (Signed)
Pt said he has hep c and waiting on a referral to hep c clinin

## 2016-02-04 ENCOUNTER — Other Ambulatory Visit: Payer: Self-pay | Admitting: Adult Health

## 2016-02-04 DIAGNOSIS — B182 Chronic viral hepatitis C: Secondary | ICD-10-CM

## 2016-02-04 NOTE — Telephone Encounter (Signed)
Attempted to contact patient multiple times and phone line disconnects.

## 2016-02-04 NOTE — Telephone Encounter (Signed)
He has been referred to infectious disease

## 2016-02-13 ENCOUNTER — Ambulatory Visit (INDEPENDENT_AMBULATORY_CARE_PROVIDER_SITE_OTHER): Payer: Medicare Other | Admitting: Adult Health

## 2016-02-13 ENCOUNTER — Encounter: Payer: Self-pay | Admitting: Adult Health

## 2016-02-13 VITALS — BP 186/82 | Temp 97.3°F | Ht 69.5 in | Wt 222.2 lb

## 2016-02-13 DIAGNOSIS — I10 Essential (primary) hypertension: Secondary | ICD-10-CM

## 2016-02-13 DIAGNOSIS — Z0000 Encounter for general adult medical examination without abnormal findings: Secondary | ICD-10-CM | POA: Diagnosis not present

## 2016-02-13 DIAGNOSIS — Z23 Encounter for immunization: Secondary | ICD-10-CM | POA: Diagnosis not present

## 2016-02-13 DIAGNOSIS — B351 Tinea unguium: Secondary | ICD-10-CM

## 2016-02-13 LAB — LIPID PANEL
Cholesterol: 107 mg/dL (ref 0–200)
HDL: 33.6 mg/dL — ABNORMAL LOW (ref >39.00)
LDL Cholesterol: 48 mg/dL (ref 0–99)
NonHDL: 73.42
Total CHOL/HDL Ratio: 3
Triglycerides: 128 mg/dL (ref 0.0–149.0)
VLDL: 25.6 mg/dL (ref 0.0–40.0)

## 2016-02-13 LAB — POC URINALSYSI DIPSTICK (AUTOMATED)
Bilirubin, UA: NEGATIVE
Glucose, UA: NEGATIVE
Ketones, UA: NEGATIVE
Leukocytes, UA: NEGATIVE
Nitrite, UA: NEGATIVE
Spec Grav, UA: 1.02
Urobilinogen, UA: 1
pH, UA: 5.5

## 2016-02-13 LAB — CBC WITH DIFFERENTIAL/PLATELET
Basophils Absolute: 0 10*3/uL (ref 0.0–0.1)
Basophils Relative: 1 % (ref 0.0–3.0)
Eosinophils Absolute: 0.1 10*3/uL (ref 0.0–0.7)
Eosinophils Relative: 2.1 % (ref 0.0–5.0)
HCT: 38.2 % — ABNORMAL LOW (ref 39.0–52.0)
Hemoglobin: 12.4 g/dL — ABNORMAL LOW (ref 13.0–17.0)
Lymphocytes Relative: 34 % (ref 12.0–46.0)
Lymphs Abs: 1.5 10*3/uL (ref 0.7–4.0)
MCHC: 32.4 g/dL (ref 30.0–36.0)
MCV: 79.1 fl (ref 78.0–100.0)
Monocytes Absolute: 0.5 10*3/uL (ref 0.1–1.0)
Monocytes Relative: 11 % (ref 3.0–12.0)
Neutro Abs: 2.3 10*3/uL (ref 1.4–7.7)
Neutrophils Relative %: 51.9 % (ref 43.0–77.0)
Platelets: 303 10*3/uL (ref 150.0–400.0)
RBC: 4.83 Mil/uL (ref 4.22–5.81)
RDW: 18.5 % — ABNORMAL HIGH (ref 11.5–15.5)
WBC: 4.5 10*3/uL (ref 4.0–10.5)

## 2016-02-13 LAB — HEPATIC FUNCTION PANEL
ALT: 18 U/L (ref 0–53)
AST: 19 U/L (ref 0–37)
Albumin: 4.2 g/dL (ref 3.5–5.2)
Alkaline Phosphatase: 115 U/L (ref 39–117)
Bilirubin, Direct: 0.1 mg/dL (ref 0.0–0.3)
Total Bilirubin: 0.5 mg/dL (ref 0.2–1.2)
Total Protein: 7.4 g/dL (ref 6.0–8.3)

## 2016-02-13 LAB — BASIC METABOLIC PANEL
BUN: 19 mg/dL (ref 6–23)
CO2: 26 mEq/L (ref 19–32)
Calcium: 9.5 mg/dL (ref 8.4–10.5)
Chloride: 107 mEq/L (ref 96–112)
Creatinine, Ser: 1.02 mg/dL (ref 0.40–1.50)
GFR: 93.72 mL/min (ref >60.00)
Glucose, Bld: 86 mg/dL (ref 70–99)
Potassium: 4.3 mEq/L (ref 3.5–5.1)
Sodium: 140 mEq/L (ref 135–145)

## 2016-02-13 LAB — TSH: TSH: 1.75 u[IU]/mL (ref 0.35–4.50)

## 2016-02-13 LAB — MAGNESIUM: Magnesium: 2 mg/dL (ref 1.5–2.5)

## 2016-02-13 LAB — HEMOGLOBIN A1C: Hgb A1c MFr Bld: 5.7 % (ref 4.6–6.5)

## 2016-02-13 MED ORDER — PNEUMOCOCCAL 13-VAL CONJ VACC IM SUSP: 0.5000 mL | INTRAMUSCULAR | Status: DC

## 2016-02-13 NOTE — Progress Notes (Signed)
Subjective:    Patient ID: [name], male    DOB: 24-Sep-1950, 65 y.o.   MRN: [id number]  HPI  Patient presents for yearly preventative medicine examination. He is a pleasant 66 year old male who  has a past medical history of Arthritis; Chronic back pain; GERD (gastroesophageal reflux disease); GIB (gastrointestinal bleeding) (2012); Glaucoma; Headache; Hepatitis C; History of blood transfusion; History of gout; Hyperlipidemia; Hypertension; Insomnia; Ischemic colitis ([place]) (2012); Joint pain; Nocturia; Numbness; PONV (postoperative nausea and vomiting); Prostate cancer ([place]); Shortness of breath dyspnea; Stroke ([place]) (2016 ); Urinary frequency; and Urinary urgency.   All immunizations and health maintenance protocols were reviewed with the patient and needed orders were placed.  Appropriate screening laboratory values were ordered for the patient including screening of hyperlipidemia, renal function and hepatic function. If indicated by BPH, a PSA was ordered.  Medication reconciliation,  past medical history, social history, problem list and allergies were reviewed in detail with the patient  Goals were established with regard to weight loss, exercise, and  diet in compliance with medications. He is walking daily. He is not following a diabetic. He eats pork and fast foods but does report eating fruits and vegetables.   He continues to smoke about a half pack a day. Wellbutrin was called in but he has not picked it up today.   He is not monitoring his blood pressure at home on a regular basis. He reports that the last time he took his BP it was 138/64.  He would like to see a podiatrist due to toenail fungus on his left foot.   Review of Systems  Constitutional: Negative.   HENT: Negative.   Eyes: Negative.   Respiratory: Negative.   Cardiovascular: Negative.   Gastrointestinal: Negative.   Endocrine: Negative.   Genitourinary: Negative.   Musculoskeletal: Positive for  arthralgias, back pain, gait problem and myalgias.  Skin: Negative.   Allergic/Immunologic: Negative.   Hematological: Negative.   Psychiatric/Behavioral: Negative.   All other systems reviewed and are negative.  Past Medical History:  Diagnosis Date  . Arthritis   . Chronic back pain   . GERD (gastroesophageal reflux disease)    uses Baking Soda  . GIB (gastrointestinal bleeding) 2012  . Glaucoma    right eye  . Headache    occasionally  . Hepatitis C   . History of blood transfusion    no abnormal reaction noted  . History of gout    doesn't take any meds  . Hyperlipidemia    not on any meds  . Hypertension    takes Amlodipine and Lisinopril daily  . Insomnia    takes Ambien nightly  . Ischemic colitis ([place]) 2012  . Joint pain   . Nocturia   . Numbness    both legs occasionally  . PONV (postoperative nausea and vomiting)   . Prostate cancer ([place])   . Shortness of breath dyspnea    rarely but when notices he can be lying/sitting/exertion.Dr.Hochrein is aware per pt  . Stroke ([place]) 2016   . Urinary frequency   . Urinary urgency     Social History   Social History  . Marital status: Married    Spouse name: N/A  . Number of children: 2  . Years of education: N/A   Occupational History  . Not on file.   Social History Main Topics  . Smoking status: Current Every Day Smoker    Packs/day: 0.50    Years: 50.00  Types: Cigarettes  . Smokeless tobacco: Never Used  . Alcohol use 0.0 oz/week     Comment: occasionally  . Drug use: No  . Sexual activity: Yes   Other Topics Concern  . Not on file   Social History Narrative   One living child .  One murdered.  Lives with girlfriend.      Past Surgical History:  Procedure Laterality Date  . APPENDECTOMY    . COLONOSCOPY    . COLONOSCOPY N/A 10/26/2015   Procedure: COLONOSCOPY;  Surgeon: Doran Stabler, MD;  Location: [facility] ENDOSCOPY;  Service: Endoscopy;  Laterality: N/A;  . ESOPHAGOGASTRODUODENOSCOPY      . ESOPHAGOGASTRODUODENOSCOPY N/A 10/26/2015   Procedure: ESOPHAGOGASTRODUODENOSCOPY (EGD);  Surgeon: Doran Stabler, MD;  [facility];  Service: Endoscopy;  Laterality: N/A;  . INGUINAL HERNIA REPAIR Right 11/04/2014   Procedure: RIGHT INGUINAL HERNIA REPAIR WITH MESH;  Surgeon: Jackolyn Confer, MD;  Location: [facility];  Service: General;  Laterality: Right;  . MASS EXCISION N/A 11/04/2014   Procedure: REMOVAL OF RIGHT GROIN SOFT TISSUE MASS;  Surgeon: Jackolyn Confer, MD;  Location: [facility];  Service: General;  Laterality: N/A;  . Multiple abdominal surgeries     total of 13  . PROSTATECTOMY    . SMALL INTESTINE SURGERY      Family History  Problem Relation Age of Onset  . Alzheimer's disease Father   . Cancer Mother     Lymph node  . Heart failure Brother 45    Transplant 13 years ago  . CAD Brother   . Heart disease Sister 39    MI    Allergies  Allergen Reactions  . Penicillins Anaphylaxis  . Sudafed [Pseudoephedrine] Other (See Comments)    Heart "races"  . Trazodone And Nefazodone Swelling    Current Outpatient Prescriptions on File Prior to Visit  Medication Sig Dispense Refill  . acetaminophen-codeine (TYLENOL #2) 300-15 MG tablet Take 1 tablet by mouth 4 (four) times daily.    Marland Kitchen buPROPion (WELLBUTRIN XL) 300 MG 24 hr tablet Take 1 tablet (300 mg total) by mouth daily. 90 tablet 2  . cloNIDine (CATAPRES) 0.1 MG tablet Take 0.1 mg daily for blood pressure >160/90 90 tablet 3  . cyclobenzaprine (FLEXERIL) 10 MG tablet Take 10 mg by mouth at bedtime.  1  . dexlansoprazole (DEXILANT) 60 MG capsule Take 60 mg by mouth daily.    . diclofenac sodium (VOLTAREN) 1 % GEL Apply 2 g topically 3 (three) times daily as needed (Severe knee pain). 100 g 0  . ferrous sulfate 325 (65 FE) MG tablet Take 1 tablet (325 mg total) by mouth daily with breakfast. 30 tablet 1  . hydrochlorothiazide (HYDRODIURIL) 25 MG tablet Take 1 tablet (25 mg total) by mouth daily. 30 tablet 0  .  HYDROcodone-acetaminophen (NORCO/VICODIN) 5-325 MG tablet Take 1 tablet by mouth every 6 (six) hours as needed for severe pain. 15 tablet 0  . isosorbide mononitrate (IMDUR) 60 MG 24 hr tablet Take 1 tablet (60 mg total) by mouth daily. 30 tablet 0  . lisinopril (PRINIVIL,ZESTRIL) 40 MG tablet Take 1 tablet (40 mg total) by mouth daily. 90 tablet 3  . LORazepam (ATIVAN) 0.5 MG tablet Take 1 tablet (0.5 mg total) by mouth every 8 (eight) hours as needed for anxiety. 10 tablet 0  . nicotine (NICODERM CQ - DOSED IN MG/24 HOURS) 14 mg/24hr patch Place 1 patch (14 mg total) onto the skin daily. 28 patch 0  .  predniSONE (DELTASONE) 10 MG tablet Take 4 tablets by mouth for 2 days, 3 tablets by mouth for 2 days, 2 tablets by mouth for 2 days, and one tablet by mouth for 2 days. 20 tablet 0  . pregabalin (LYRICA) 150 MG capsule Take 150 mg by mouth 2 (two) times daily.    Marland Kitchen senna-docusate (SENOKOT-S) 8.6-50 MG tablet Take 1 tablet by mouth at bedtime as needed for moderate constipation.    . solifenacin (VESICARE) 5 MG tablet Take 5 mg by mouth at bedtime.     Marland Kitchen zolpidem (AMBIEN) 10 MG tablet Take 1 tablet (10 mg total) by mouth at bedtime. 30 tablet 0   No current facility-administered medications on file prior to visit.     BP (!) 186/82   Temp 97.3 F (36.3 C) (Oral)   Ht 5' 9.5" (1.765 m)   Wt 222 lb 3.2 oz (100.8 kg)   BMI 32.34 kg/m       Objective:   Physical Exam  Constitutional: He is oriented to person, place, and time. He appears well-developed and well-nourished. No distress.  HENT:  Head: Normocephalic and atraumatic.  Right Ear: External ear normal.  Left Ear: External ear normal.  Nose: Nose normal.  Mouth/Throat: Oropharynx is clear and moist. No oropharyngeal exudate.  Eyes: Conjunctivae are normal. Pupils are equal, round, and reactive to light. Right eye exhibits no discharge. Left eye exhibits no discharge. No scleral icterus.  Neck: JVD present. No tracheal deviation  present. No thyromegaly present.  Cardiovascular: Normal rate, regular rhythm, normal heart sounds and intact distal pulses.  Exam reveals no gallop and no friction rub.   No murmur heard. Pulmonary/Chest: Effort normal and breath sounds normal. No stridor. No respiratory distress. He has no wheezes. He has no rales. He exhibits no tenderness.  Abdominal: Soft. Bowel sounds are normal. He exhibits no distension and no mass. There is no tenderness. There is no rebound and no guarding.  Genitourinary:  Genitourinary Comments: Deferred   Musculoskeletal: Normal range of motion. He exhibits tenderness. He exhibits no edema.  Tenderness in right knee with palpation. No apparent swelling noted.  He does have a thick dark toe nail on right great toe.   Lymphadenopathy:    He has no cervical adenopathy.  Neurological: He is alert and oriented to person, place, and time. He has normal reflexes. He displays normal reflexes. No cranial nerve deficit. He exhibits normal muscle tone. Coordination normal.  Skin: Skin is warm and dry. No rash noted. He is not diaphoretic. No erythema. No pallor.  Psychiatric: He has a normal mood and affect. His behavior is normal. Judgment and thought content normal.  Nursing note and vitals reviewed.     Assessment & Plan:  1. Routine general medical examination at a health care facility - Basic metabolic panel - CBC with Differential/Platelet - Hemoglobin A1c - Hepatic function panel - Lipid panel - TSH - POCT Urinalysis Dipstick (Automated) - Magnesium - Needs to start eating healthy and exercising more - Follow up in one year or sooner if needed  2. Essential hypertension - Under much better control.  - I want him to check his BP in the morning and before bed. Follow up with readings  - Basic metabolic panel - CBC with Differential/Platelet - Hemoglobin A1c - Hepatic function panel - Lipid panel - TSH - POCT Urinalysis Dipstick (Automated) -  Magnesium  3. Toenail fungus - Right great toe nail appears to need to be removed.  -  Ambulatory referral to Podiatry  4. Need for vaccination with 13-polyvalent pneumococcal conjugate vaccine - pneumococcal 13-valent conjugate vaccine (PREVNAR 13) injection 0.5 mL; Inject 0.5 mLs into the muscle tomorrow at 10 am.  Dorothyann Peng, NP

## 2016-02-13 NOTE — Patient Instructions (Signed)
It was great seeing you again!  I will follow up with you regarding your labs  Someone will call you from podiatry   Pick up your prescription of Wellbutrin from the pharmacy. Follow up with me one month after starting this medication

## 2016-02-19 ENCOUNTER — Telehealth: Payer: Self-pay | Admitting: Adult Health

## 2016-02-19 NOTE — Telephone Encounter (Signed)
Received PA request for Ambien 10mg  from [facility]. PA submitted & is pending. Key: [id number]

## 2016-02-20 NOTE — Telephone Encounter (Signed)
PA Denied. Waiting for fax with more information

## 2016-02-24 MED ORDER — SUVOREXANT 15 MG PO TABS: 1.0000 | ORAL_TABLET | Freq: Every day | ORAL | 0 refills | Status: DC

## 2016-02-24 NOTE — Telephone Encounter (Addendum)
Patient is allergic to Trazadone, Belsomra Rx printed for signature.

## 2016-02-24 NOTE — Telephone Encounter (Signed)
Called and spoke with pharmacy. Insurance only allows 90 tabs a year of Ambien. Other alternatives are Belsomra or trazadone.

## 2016-02-24 NOTE — Telephone Encounter (Signed)
Ok to use Trazodone 50mg  nightly. 30 pills with 3 refills.

## 2016-02-24 NOTE — Telephone Encounter (Signed)
Called and spoke with patient and advised him that it may take a night or two to start working. Patient verbalized understanding & Rx faxed to [facility].

## 2016-02-26 ENCOUNTER — Telehealth: Payer: Self-pay

## 2016-02-26 NOTE — Telephone Encounter (Signed)
Prior Authorization for Zolpidem Tartrate denial received.  [id number]

## 2016-02-27 ENCOUNTER — Telehealth: Payer: Self-pay | Admitting: *Deleted

## 2016-02-27 NOTE — Telephone Encounter (Signed)
He should have Belsomra 15mg  at the pharmacy

## 2016-02-27 NOTE — Telephone Encounter (Signed)
[facility] faxed a drug change request form for Zolpidem 10mg -states the insurance plan will not cover this.  Trazodone, Zaleplon, Belsomra are preferred medications.

## 2016-04-29 ENCOUNTER — Telehealth: Payer: Self-pay | Admitting: *Deleted

## 2016-04-29 NOTE — Telephone Encounter (Signed)
Called patient and scheduled him for a lab appt on 05/12/16 at 3:30 pm. Myrtis Hopping

## 2016-05-05 ENCOUNTER — Other Ambulatory Visit: Payer: Medicare Other

## 2016-05-12 ENCOUNTER — Other Ambulatory Visit: Payer: Medicare Other

## 2016-05-12 DIAGNOSIS — Z7251 High risk heterosexual behavior: Secondary | ICD-10-CM | POA: Diagnosis not present

## 2016-05-12 DIAGNOSIS — B182 Chronic viral hepatitis C: Secondary | ICD-10-CM

## 2016-05-12 DIAGNOSIS — I2510 Atherosclerotic heart disease of native coronary artery without angina pectoris: Secondary | ICD-10-CM | POA: Diagnosis not present

## 2016-05-13 LAB — HEPATITIS A ANTIBODY, TOTAL: Hep A Total Ab: REACTIVE — AB

## 2016-05-13 LAB — HEPATITIS B SURFACE ANTIGEN: Hepatitis B Surface Ag: NEGATIVE

## 2016-05-13 LAB — HEPATITIS B CORE ANTIBODY, TOTAL: Hep B Core Total Ab: REACTIVE — AB

## 2016-05-13 LAB — HEPATITIS B SURFACE ANTIBODY,QUALITATIVE: Hep B S Ab: NEGATIVE

## 2016-05-13 LAB — HIV ANTIBODY (ROUTINE TESTING W REFLEX): HIV 1&2 Ab, 4th Generation: NONREACTIVE

## 2016-05-13 LAB — AFP TUMOR MARKER: AFP-Tumor Marker: 3.6 ng/mL (ref <6.1)

## 2016-05-13 LAB — PROTIME-INR
INR: 1.2 — ABNORMAL HIGH
Prothrombin Time: 12.6 s — ABNORMAL HIGH (ref 9.0–11.5)

## 2016-05-14 LAB — LIVER FIBROSIS, FIBROTEST-ACTITEST
ALT: 16 U/L (ref 9–46)
Alpha-2-Macroglobulin: 331 mg/dL — ABNORMAL HIGH (ref 106–279)
Apolipoprotein A1: 129 mg/dL (ref 94–176)
Bilirubin: 0.5 mg/dL (ref 0.2–1.2)
Fibrosis Score: 0.61
GGT: 86 U/L — ABNORMAL HIGH (ref 3–70)
Haptoglobin: 274 mg/dL — ABNORMAL HIGH (ref 43–212)
Necroinflammat ACT Score: 0.09
Reference ID: 1670002

## 2016-05-16 LAB — HCV RNA, QUANT REAL-TIME PCR W/REFLEX
HCV RNA, PCR, QN (Log): 5.72 LogIU/mL — ABNORMAL HIGH
HCV RNA, PCR, QN: 529000 IU/mL — ABNORMAL HIGH

## 2016-05-16 LAB — HCV RNA,LIPA RFLX NS5A DRUG RESIST

## 2016-05-26 ENCOUNTER — Encounter: Payer: Medicare Other | Admitting: Internal Medicine

## 2016-05-27 ENCOUNTER — Encounter: Payer: Self-pay | Admitting: Internal Medicine

## 2016-05-27 ENCOUNTER — Ambulatory Visit (INDEPENDENT_AMBULATORY_CARE_PROVIDER_SITE_OTHER): Payer: Medicare Other | Admitting: Internal Medicine

## 2016-05-27 DIAGNOSIS — I639 Cerebral infarction, unspecified: Secondary | ICD-10-CM | POA: Diagnosis not present

## 2016-05-27 DIAGNOSIS — I10 Essential (primary) hypertension: Secondary | ICD-10-CM

## 2016-05-27 MED ORDER — ISOSORBIDE MONONITRATE ER 60 MG PO TB24: 60.0000 mg | ORAL_TABLET | Freq: Every day | ORAL | 0 refills | Status: DC

## 2016-05-27 MED ORDER — CLONIDINE HCL 0.1 MG PO TABS: ORAL_TABLET | ORAL | 0 refills | Status: DC

## 2016-05-27 MED ORDER — HYDROCHLOROTHIAZIDE 25 MG PO TABS: 25.0000 mg | ORAL_TABLET | Freq: Every day | ORAL | 0 refills | Status: DC

## 2016-05-27 MED ORDER — CLONIDINE HCL 0.1 MG PO TABS
0.2000 mg | ORAL_TABLET | Freq: Once | ORAL | Status: AC
  Administered 2016-05-27: 0.2 mg via ORAL

## 2016-05-27 MED ORDER — LISINOPRIL 40 MG PO TABS: 40.0000 mg | ORAL_TABLET | Freq: Every day | ORAL | 0 refills | Status: DC

## 2016-05-27 MED FILL — HYDROCHLOROTHIAZIDE 25 MG T: 25 | 30 days supply | Qty: 30 | Fill #0

## 2016-05-27 MED FILL — LISINOPRIL 40 MG TABLET: 40 | 30 days supply | Qty: 30 | Fill #0

## 2016-05-27 MED FILL — ISOSORBIDE MN ER 60 MG TAB: 60 | 30 days supply | Qty: 30 | Fill #0

## 2016-05-27 MED FILL — cloNIDine HCL 0.1 MG TABS: 0.1 | 30 days supply | Qty: 30 | Fill #0
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
## 2016-05-27 NOTE — Patient Instructions (Signed)
2 wk visit with minh/pharmacy team

## 2016-06-07 ENCOUNTER — Ambulatory Visit: Payer: Medicare Other

## 2016-06-15 ENCOUNTER — Ambulatory Visit: Payer: Medicare Other

## 2016-07-21 ENCOUNTER — Ambulatory Visit: Payer: Medicare Other

## 2016-07-28 ENCOUNTER — Ambulatory Visit: Payer: Medicare Other

## 2016-09-08 ENCOUNTER — Encounter (HOSPITAL_COMMUNITY): Payer: Self-pay | Admitting: Emergency Medicine

## 2016-09-08 ENCOUNTER — Emergency Department (HOSPITAL_COMMUNITY)
Admission: EM | Admit: 2016-09-08 | Discharge: 2016-09-08 | Disposition: A | Discharge status: Home / Self Care | Payer: Medicare Other | Attending: Emergency Medicine | Admitting: Emergency Medicine

## 2016-09-08 ENCOUNTER — Emergency Department (HOSPITAL_COMMUNITY): Payer: Medicare Other

## 2016-09-08 DIAGNOSIS — F17210 Nicotine dependence, cigarettes, uncomplicated: Secondary | ICD-10-CM | POA: Insufficient documentation

## 2016-09-08 DIAGNOSIS — I10 Essential (primary) hypertension: Secondary | ICD-10-CM | POA: Insufficient documentation

## 2016-09-08 DIAGNOSIS — R0602 Shortness of breath: Secondary | ICD-10-CM | POA: Diagnosis not present

## 2016-09-08 DIAGNOSIS — Z8673 Personal history of transient ischemic attack (TIA), and cerebral infarction without residual deficits: Secondary | ICD-10-CM | POA: Diagnosis not present

## 2016-09-08 DIAGNOSIS — Z8546 Personal history of malignant neoplasm of prostate: Secondary | ICD-10-CM | POA: Insufficient documentation

## 2016-09-08 LAB — BASIC METABOLIC PANEL
Anion gap: 5 (ref 5–15)
BUN: 13 mg/dL (ref 6–20)
CO2: 25 mmol/L (ref 22–32)
Calcium: 9.1 mg/dL (ref 8.9–10.3)
Chloride: 111 mmol/L (ref 101–111)
Creatinine, Ser: 1.22 mg/dL (ref 0.61–1.24)
GFR calc Af Amer: >60 mL/min (ref >60)
GFR calc non Af Amer: 60 mL/min — ABNORMAL LOW (ref >60)
Glucose, Bld: 106 mg/dL — ABNORMAL HIGH (ref 65–99)
Potassium: 3.6 mmol/L (ref 3.5–5.1)
Sodium: 141 mmol/L (ref 135–145)

## 2016-09-08 LAB — CBC WITH DIFFERENTIAL/PLATELET
Basophils Absolute: 0 10*3/uL (ref 0.0–0.1)
Basophils Relative: 0 %
Eosinophils Absolute: 0.1 10*3/uL (ref 0.0–0.7)
Eosinophils Relative: 2 %
HCT: 35.8 % — ABNORMAL LOW (ref 39.0–52.0)
Hemoglobin: 10.9 g/dL — ABNORMAL LOW (ref 13.0–17.0)
Lymphocytes Relative: 31 %
Lymphs Abs: 1.5 10*3/uL (ref 0.7–4.0)
MCH: 23.9 pg — ABNORMAL LOW (ref 26.0–34.0)
MCHC: 30.4 g/dL (ref 30.0–36.0)
MCV: 78.5 fL (ref 78.0–100.0)
Monocytes Absolute: 0.5 10*3/uL (ref 0.1–1.0)
Monocytes Relative: 11 %
Neutro Abs: 2.7 10*3/uL (ref 1.7–7.7)
Neutrophils Relative %: 56 %
Platelets: 265 10*3/uL (ref 150–400)
RBC: 4.56 MIL/uL (ref 4.22–5.81)
RDW: 15.9 % — ABNORMAL HIGH (ref 11.5–15.5)
WBC: 4.8 10*3/uL (ref 4.0–10.5)

## 2016-09-08 LAB — I-STAT TROPONIN, ED
Troponin i, poc: 0 ng/mL (ref 0.00–0.08)
Troponin i, poc: 0.01 ng/mL (ref 0.00–0.08)

## 2016-09-08 MED ORDER — LISINOPRIL 20 MG PO TABS
40.0000 mg | ORAL_TABLET | Freq: Once | ORAL | Status: AC
  Administered 2016-09-08: 40 mg via ORAL
  Filled 2016-09-08: qty 2

## 2016-09-08 MED ORDER — LISINOPRIL 40 MG PO TABS: 40.0000 mg | ORAL_TABLET | Freq: Every day | ORAL | 0 refills | Status: DC

## 2016-09-08 MED ORDER — CLONIDINE HCL 0.1 MG PO TABS: ORAL_TABLET | ORAL | 0 refills | Status: DC

## 2016-09-08 MED ORDER — HYDROCHLOROTHIAZIDE 25 MG PO TABS: 25.0000 mg | ORAL_TABLET | Freq: Every day | ORAL | 0 refills | Status: DC

## 2016-09-08 MED ORDER — CLONIDINE HCL 0.1 MG PO TABS
0.1000 mg | ORAL_TABLET | Freq: Once | ORAL | Status: AC
  Administered 2016-09-08: 0.1 mg via ORAL
  Filled 2016-09-08: qty 1

## 2016-09-08 MED ORDER — HYDROCHLOROTHIAZIDE 25 MG PO TABS
25.0000 mg | ORAL_TABLET | Freq: Every day | ORAL | Status: DC
  Administered 2016-09-08: 25 mg via ORAL
  Filled 2016-09-08: qty 1

## 2016-09-08 NOTE — ED Triage Notes (Signed)
Per EMS pt was at [facility], previously has BP problems and on medications, pt BP was checked and told to come to er. Pt has previous strokes

## 2016-09-08 NOTE — Discharge Instructions (Signed)
Follow-up with your PCP.

## 2016-09-08 NOTE — ED Provider Notes (Signed)
[facility] DEPT Provider Note   CSN: [id number] Arrival date & time: 09/08/16  1950     History   Chief Complaint Chief Complaint  Patient presents with  . Hypertension    HPI [name] is a 66 y.o. male.  66 yo M with a chief complaint of hypertension. Patient states that he had a headache a couple days ago and had his blood pressure checked and noted it was high. Since then he's been having his blood pressure checked every day. He said it was especially high today in the nurse at the homeless shelter suggested that he come to the emergency department. Patient has a history of hypertension and is on multiple medications. He has not taken these in the past 2 months because he is in the middle of a divorce and had to move into a homeless shelter. He said he says he gets money he will try and get his medications filled. On review of systems patient said that he had had no symptoms however did state that he had had shortness of breath off and on for the past couple days. Not worse with exertion. Seems to come and go sporadically. Deny chest pain with this. Denies any visual symptoms. Has left-sided weakness secondary to a recent stroke. Denies any change in his weakness.   The history is provided by the patient.  Hypertension  This is a new problem. The current episode started more than 2 days ago. The problem occurs constantly. The problem has not changed since onset.Associated symptoms include headaches and shortness of breath. Pertinent negatives include no chest pain and no abdominal pain. Nothing aggravates the symptoms. Nothing relieves the symptoms. He has tried nothing for the symptoms. The treatment provided no relief.    Past Medical History:  Diagnosis Date  . Arthritis   . Chronic back pain   . GERD (gastroesophageal reflux disease)    uses Baking Soda  . GIB (gastrointestinal bleeding) 2012  . Glaucoma    right eye  . Headache    occasionally  . Hepatitis C    . History of blood transfusion    no abnormal reaction noted  . History of gout    doesn't take any meds  . Hyperlipidemia    not on any meds  . Hypertension    takes Amlodipine and Lisinopril daily  . Insomnia    takes Ambien nightly  . Ischemic colitis ([place]) 2012  . Joint pain   . Nocturia   . Numbness    both legs occasionally  . PONV (postoperative nausea and vomiting)   . Prostate cancer ([place])   . Shortness of breath dyspnea    rarely but when notices he can be lying/sitting/exertion.Dr.Hochrein is aware per pt  . Stroke ([place]) 2016   . Urinary frequency   . Urinary urgency     Patient Active Problem List   Diagnosis Date Noted  . Anemia, iron deficiency   . Gastrointestinal bleeding 10/24/2015  . Absolute anemia   . Tobacco abuse   . Acute ischemic stroke ([place])   . Acute CVA (cerebrovascular accident) ([place]) 05/31/2015  . Stroke ([place]) 05/31/2015  . Dyspnea 04/10/2014  . HTN (hypertension) 04/10/2014  . Inguinal hernia without mention of obstruction or gangrene, unilateral or unspecified, (not specified as recurrent)-right 01/30/2014    Past Surgical History:  Procedure Laterality Date  . APPENDECTOMY    . COLONOSCOPY    . COLONOSCOPY N/A 10/26/2015   Procedure: COLONOSCOPY;  Surgeon: Mallie Mussel  Evern Bio, MD;  Location: [facility];  Service: Endoscopy;  Laterality: N/A;  . ESOPHAGOGASTRODUODENOSCOPY    . ESOPHAGOGASTRODUODENOSCOPY N/A 10/26/2015   Procedure: ESOPHAGOGASTRODUODENOSCOPY (EGD);  Surgeon: Doran Stabler, MD;  Location[facility] ENDOSCOPY;  Service: Endoscopy;  Laterality: N/A;  . INGUINAL HERNIA REPAIR Right 11/04/2014   Procedure: RIGHT INGUINAL HERNIA REPAIR WITH MESH;  Surgeon: Jackolyn Confer, MD;  Location: [facility];  Service: General;  Laterality: Right;  . MASS EXCISION N/A 11/04/2014   Procedure: REMOVAL OF RIGHT GROIN SOFT TISSUE MASS;  Surgeon: Jackolyn Confer, MD;  Location: [facility];  Service: General;  Laterality: N/A;  . Multiple abdominal  surgeries     total of 13  . PROSTATECTOMY    . SMALL INTESTINE SURGERY         Home Medications    Prior to Admission medications   Medication Sig Start Date End Date Taking? Authorizing Provider  acetaminophen-codeine (TYLENOL #2) 300-15 MG tablet Take 1 tablet by mouth 4 (four) times daily.    Historical Provider, MD  buPROPion (WELLBUTRIN XL) 300 MG 24 hr tablet Take 1 tablet (300 mg total) by mouth daily. 01/21/16   Dorothyann Peng, NP  cloNIDine (CATAPRES) 0.1 MG tablet Take 0.1 mg daily for blood pressure >160/90 09/08/16   Deno Etienne, DO  cyclobenzaprine (FLEXERIL) 10 MG tablet Take 10 mg by mouth at bedtime. 10/06/15   Historical Provider, MD  dexlansoprazole (DEXILANT) 60 MG capsule Take 60 mg by mouth daily.    Historical Provider, MD  diclofenac sodium (VOLTAREN) 1 % GEL Apply 2 g topically 3 (three) times daily as needed (Severe knee pain). 06/04/15   Geradine Girt, DO  ferrous sulfate 325 (65 FE) MG tablet Take 1 tablet (325 mg total) by mouth daily with breakfast. 10/26/15   Delfina Redwood, MD  hydrochlorothiazide (HYDRODIURIL) 25 MG tablet Take 1 tablet (25 mg total) by mouth daily. 09/08/16   Deno Etienne, DO  HYDROcodone-acetaminophen (NORCO/VICODIN) 5-325 MG tablet Take 1 tablet by mouth every 6 (six) hours as needed for severe pain. 06/04/15   Geradine Girt, DO  isosorbide mononitrate (IMDUR) 60 MG 24 hr tablet Take 1 tablet (60 mg total) by mouth daily. 05/27/16   Carlyle Basques, MD  lisinopril (PRINIVIL,ZESTRIL) 40 MG tablet Take 1 tablet (40 mg total) by mouth daily. 09/08/16   Deno Etienne, DO  LORazepam (ATIVAN) 0.5 MG tablet Take 1 tablet (0.5 mg total) by mouth every 8 (eight) hours as needed for anxiety. 06/04/15   Geradine Girt, DO  nicotine (NICODERM CQ - DOSED IN MG/24 HOURS) 14 mg/24hr patch Place 1 patch (14 mg total) onto the skin daily. 06/04/15   Geradine Girt, DO  predniSONE (DELTASONE) 10 MG tablet Take 4 tablets by mouth for 2 days, 3 tablets by mouth for 2  days, 2 tablets by mouth for 2 days, and one tablet by mouth for 2 days. 12/08/15   Delano Metz, FNP  pregabalin (LYRICA) 150 MG capsule Take 150 mg by mouth 2 (two) times daily.    Historical Provider, MD  senna-docusate (SENOKOT-S) 8.6-50 MG tablet Take 1 tablet by mouth at bedtime as needed for moderate constipation. 06/04/15   Geradine Girt, DO  solifenacin (VESICARE) 5 MG tablet Take 5 mg by mouth at bedtime.     Historical Provider, MD  Suvorexant (BELSOMRA) 15 MG TABS Take 1 tablet by mouth at bedtime. 02/24/16   Dorothyann Peng, NP  zolpidem (AMBIEN) 10 MG tablet  Take 1 tablet (10 mg total) by mouth at bedtime. 01/21/16   Dorothyann Peng, NP    Family History Family History  Problem Relation Age of Onset  . Alzheimer's disease Father   . Cancer Mother     Lymph node  . Heart failure Brother 45    Transplant 13 years ago  . CAD Brother   . Heart disease Sister 77    MI    Social History Social History  Substance Use Topics  . Smoking status: Current Every Day Smoker    Packs/day: 0.50    Years: 50.00    Types: Cigarettes  . Smokeless tobacco: Never Used  . Alcohol use 0.0 oz/week     Comment: occasionally     Allergies   Penicillins; Sudafed [pseudoephedrine]; and Trazodone and nefazodone   Review of Systems Review of Systems  Constitutional: Negative for chills and fever.  HENT: Negative for congestion and facial swelling.   Eyes: Negative for discharge and visual disturbance.  Respiratory: Positive for shortness of breath.   Cardiovascular: Negative for chest pain and palpitations.  Gastrointestinal: Negative for abdominal pain, diarrhea and vomiting.  Musculoskeletal: Negative for arthralgias and myalgias.  Skin: Negative for color change and rash.  Neurological: Positive for headaches. Negative for tremors and syncope.  Psychiatric/Behavioral: Negative for confusion and dysphoric mood.     Physical Exam Updated Vital Signs BP 179/96   Pulse 66   Temp  98.1 F (36.7 C) (Oral)   Resp 23   SpO2 96%   Physical Exam  Constitutional: He is oriented to person, place, and time. He appears well-developed and well-nourished.  HENT:  Head: Normocephalic and atraumatic.  Eyes: EOM are normal. Pupils are equal, round, and reactive to light.  Neck: Normal range of motion. Neck supple. No JVD present.  Cardiovascular: Normal rate and regular rhythm.  Exam reveals no gallop and no friction rub.   No murmur heard. Pulmonary/Chest: No respiratory distress. He has no wheezes.  Abdominal: He exhibits no distension and no mass. There is no tenderness. There is no rebound and no guarding.  Musculoskeletal: Normal range of motion.  Neurological: He is alert and oriented to person, place, and time.  Skin: No rash noted. No pallor.  Psychiatric: He has a normal mood and affect. His behavior is normal.  Nursing note and vitals reviewed.    ED Treatments / Results  Labs (all labs ordered are listed, but only abnormal results are displayed) Labs Reviewed  CBC WITH DIFFERENTIAL/PLATELET - Abnormal; Notable for the following:       Result Value   Hemoglobin 10.9 (*)    HCT 35.8 (*)    MCH 23.9 (*)    RDW 15.9 (*)    All other components within normal limits  BASIC METABOLIC PANEL - Abnormal; Notable for the following:    Glucose, Bld 106 (*)    GFR calc non Af Amer 60 (*)    All other components within normal limits  I-STAT TROPOININ, ED  I-STAT TROPOININ, ED    EKG  EKG Interpretation  Date/Time:  Wednesday September 08 2016 21:25:31 EST Ventricular Rate:  77 PR Interval:    QRS Duration: 99 QT Interval:  404 QTC Calculation: 458 R Axis:   -33 Text Interpretation:  Sinus rhythm Probable left atrial enlargement Abnormal R-wave progression, early transition Left ventricular hypertrophy Anterior Q waves, possibly due to LVH No significant change since last tracing Confirmed by Jhamal Plucinski MD, DANIEL [id number]) on 09/08/2016 11:28:26 PM  Radiology Dg Chest 2 View  Result Date: 09/08/2016 CLINICAL DATA:  Initial evaluation for acute shortness of breath. History of hypertension. EXAM: CHEST  2 VIEW COMPARISON:  Prior radiograph from 11/07/2015. FINDINGS: Cardiac and mediastinal silhouettes are within normal limits. Aortic atherosclerosis. Lungs normally inflated. No focal infiltrates. No pulmonary edema or pleural effusion. No pneumothorax. No acute osseus abnormality.  Hiatal hernia noted. IMPRESSION: 1. No active cardiopulmonary disease. 2. Hiatal hernia. 3. Aortic atherosclerosis. Electronically Signed   By: Jeannine Boga M.D.   On: 09/08/2016 21:04    Procedures Procedures (including critical care time)  Medications Ordered in ED Medications  hydrochlorothiazide (HYDRODIURIL) tablet 25 mg (25 mg Oral Given 09/08/16 2029)  lisinopril (PRINIVIL,ZESTRIL) tablet 40 mg (40 mg Oral Given 09/08/16 2029)  cloNIDine (CATAPRES) tablet 0.1 mg (0.1 mg Oral Given 09/08/16 2029)     Initial Impression / Assessment and Plan / ED Course  I have reviewed the triage vital signs and the nursing notes.  Pertinent labs & imaging results that were available during my care of the patient were reviewed by me and considered in my medical decision making (see chart for details).     66 yo M With a chief complaint of hypertension. Patient found review of systems no shortness of breath off and on for the past couple days. Will obtain a delta troponin.  Delta negative.  D/c home.   11:28 PM:  I have discussed the diagnosis/risks/treatment options with the patient and believe the pt to be eligible for discharge home to follow-up with PCP. We also discussed returning to the ED immediately if new or worsening sx occur. We discussed the sx which are most concerning (e.g., sudden worsening pain, fever, inability to tolerate by mouth) that necessitate immediate return. Medications administered to the patient during their visit and any new  prescriptions provided to the patient are listed below.  Medications given during this visit Medications  hydrochlorothiazide (HYDRODIURIL) tablet 25 mg (25 mg Oral Given 09/08/16 2029)  lisinopril (PRINIVIL,ZESTRIL) tablet 40 mg (40 mg Oral Given 09/08/16 2029)  cloNIDine (CATAPRES) tablet 0.1 mg (0.1 mg Oral Given 09/08/16 2029)     The patient appears reasonably screen and/or stabilized for discharge and I doubt any other medical condition or other [facility] requiring further screening, evaluation, or treatment in the ED at this time prior to discharge.    Final Clinical Impressions(s) / ED Diagnoses   Final diagnoses:  Hypertension, unspecified type    New Prescriptions Discharge Medication List as of 09/08/2016 11:00 PM       Deno Etienne, DO 09/08/16 2328

## 2016-09-09 ENCOUNTER — Telehealth: Payer: Self-pay | Admitting: *Deleted

## 2016-09-09 NOTE — Telephone Encounter (Signed)
Chart reviewed.  Spoke with pt concerning his medications and not being able to afford them.  Explained the [place]- Medication Assistance program to pt. Pt understands that the [facility] program is only a one time in one year from the date of discharge to next year. Pt states he is unable to afford co-pay; [facility] will override co-pay co-pay.

## 2016-09-10 ENCOUNTER — Emergency Department (HOSPITAL_COMMUNITY)
Admission: EM | Admit: 2016-09-10 | Discharge: 2016-09-10 | Disposition: A | Discharge status: Home / Self Care | Payer: Medicare Other | Attending: Emergency Medicine | Admitting: Emergency Medicine

## 2016-09-10 ENCOUNTER — Encounter (HOSPITAL_COMMUNITY): Payer: Self-pay | Admitting: *Deleted

## 2016-09-10 DIAGNOSIS — Z8673 Personal history of transient ischemic attack (TIA), and cerebral infarction without residual deficits: Secondary | ICD-10-CM | POA: Insufficient documentation

## 2016-09-10 DIAGNOSIS — Z8546 Personal history of malignant neoplasm of prostate: Secondary | ICD-10-CM | POA: Diagnosis not present

## 2016-09-10 DIAGNOSIS — F17210 Nicotine dependence, cigarettes, uncomplicated: Secondary | ICD-10-CM | POA: Insufficient documentation

## 2016-09-10 DIAGNOSIS — I10 Essential (primary) hypertension: Secondary | ICD-10-CM | POA: Insufficient documentation

## 2016-09-10 DIAGNOSIS — R55 Syncope and collapse: Secondary | ICD-10-CM | POA: Diagnosis not present

## 2016-09-10 LAB — BASIC METABOLIC PANEL
Anion gap: 7 (ref 5–15)
BUN: 16 mg/dL (ref 6–20)
CO2: 25 mmol/L (ref 22–32)
Calcium: 9.5 mg/dL (ref 8.9–10.3)
Chloride: 107 mmol/L (ref 101–111)
Creatinine, Ser: 0.99 mg/dL (ref 0.61–1.24)
GFR calc Af Amer: >60 mL/min (ref >60)
GFR calc non Af Amer: >60 mL/min (ref >60)
Glucose, Bld: 100 mg/dL — ABNORMAL HIGH (ref 65–99)
Potassium: 4.5 mmol/L (ref 3.5–5.1)
Sodium: 139 mmol/L (ref 135–145)

## 2016-09-10 LAB — URINALYSIS, ROUTINE W REFLEX MICROSCOPIC
Bilirubin Urine: NEGATIVE
Glucose, UA: NEGATIVE mg/dL
Hgb urine dipstick: NEGATIVE
Ketones, ur: NEGATIVE mg/dL
Leukocytes, UA: NEGATIVE
Nitrite: NEGATIVE
Protein, ur: 30 mg/dL — AB
Specific Gravity, Urine: 1.023 (ref 1.005–1.030)
pH: 5 (ref 5.0–8.0)

## 2016-09-10 LAB — CBC WITH DIFFERENTIAL/PLATELET
Basophils Absolute: 0 10*3/uL (ref 0.0–0.1)
Basophils Relative: 0 %
Eosinophils Absolute: 0.1 10*3/uL (ref 0.0–0.7)
Eosinophils Relative: 1 %
HCT: 37.9 % — ABNORMAL LOW (ref 39.0–52.0)
Hemoglobin: 11.5 g/dL — ABNORMAL LOW (ref 13.0–17.0)
Lymphocytes Relative: 27 %
Lymphs Abs: 1.6 10*3/uL (ref 0.7–4.0)
MCH: 23.8 pg — ABNORMAL LOW (ref 26.0–34.0)
MCHC: 30.3 g/dL (ref 30.0–36.0)
MCV: 78.5 fL (ref 78.0–100.0)
Monocytes Absolute: 0.6 10*3/uL (ref 0.1–1.0)
Monocytes Relative: 10 %
Neutro Abs: 3.6 10*3/uL (ref 1.7–7.7)
Neutrophils Relative %: 62 %
Platelets: 307 10*3/uL (ref 150–400)
RBC: 4.83 MIL/uL (ref 4.22–5.81)
RDW: 16 % — ABNORMAL HIGH (ref 11.5–15.5)
WBC: 5.9 10*3/uL (ref 4.0–10.5)

## 2016-09-10 LAB — TROPONIN I: Troponin I: <0.03 ng/mL (ref <0.03)

## 2016-09-10 LAB — I-STAT TROPONIN, ED: Troponin i, poc: 0 ng/mL (ref 0.00–0.08)

## 2016-09-10 LAB — CBG MONITORING, ED: Glucose-Capillary: 92 mg/dL (ref 65–99)

## 2016-09-10 NOTE — ED Triage Notes (Signed)
Per EMS- pt was standing with friends. Pt became diaphoretic for approx 15 minutes and then had a syncopal episode lasting approx 1 minute. Pt reported to be pain free.

## 2016-09-10 NOTE — Discharge Instructions (Addendum)
If you were given medicines take as directed.  If you are on coumadin or contraceptives realize their levels and effectiveness is altered by many different medicines.  If you have any reaction (rash, tongues swelling, other) to the medicines stop taking and see a physician.    If your blood pressure was elevated in the ER make sure you follow up for management with a primary doctor or return for chest pain, shortness of breath or stroke symptoms.  Please follow up as directed and return to the ER or see a physician for new or worsening symptoms.  Thank you. Vitals:   09/10/16 1500 09/10/16 1515 09/10/16 1530 09/10/16 1545  BP: 133/73 132/66 131/75 139/71  Pulse: (!) 58 (!) 55 (!) 56 (!) 56  Resp: 24 14 18 19   Temp:      TempSrc:      SpO2: 98% 98% 99% 100%

## 2016-09-10 NOTE — ED Notes (Signed)
Pt ambulated in hallway with pulse ox. spo2 99-100% and HR 70-78.

## 2016-09-10 NOTE — ED Provider Notes (Signed)
[facility] DEPT Provider Note   CSN: [id number] Arrival date & time: 09/10/16  1416     History   Chief Complaint Chief Complaint  Patient presents with  . Loss of Consciousness    HPI [name] is a 66 y.o. male. Patient with history of hypertension, hyperlipidemia, prior CVA, prior GI bleed presents after a syncopal episode. He states he was with friends when he remembers getting sweaty. He does not recall the entire event. Per EMS report, the friends state he lost consciousness for about 1 minute. Patient denies any chest pain, shortness of breath, or palpitations prior to or since this event. Patient was otherwise feeling well today. He has not had any fever or chills. He was seen here 2 days ago for hypertension. He states he has felt fine since that time. He has no history of coronary artery disease.  HPI  Past Medical History:  Diagnosis Date  . Arthritis   . Chronic back pain   . GERD (gastroesophageal reflux disease)    uses Baking Soda  . GIB (gastrointestinal bleeding) 2012  . Glaucoma    right eye  . Headache    occasionally  . Hepatitis C   . History of blood transfusion    no abnormal reaction noted  . History of gout    doesn't take any meds  . Hyperlipidemia    not on any meds  . Hypertension    takes Amlodipine and Lisinopril daily  . Insomnia    takes Ambien nightly  . Ischemic colitis ([place]) 2012  . Joint pain   . Nocturia   . Numbness    both legs occasionally  . PONV (postoperative nausea and vomiting)   . Prostate cancer ([place])   . Shortness of breath dyspnea    rarely but when notices he can be lying/sitting/exertion.Dr.Hochrein is aware per pt  . Stroke ([place]) 2016   . Urinary frequency   . Urinary urgency     Patient Active Problem List   Diagnosis Date Noted  . Anemia, iron deficiency   . Gastrointestinal bleeding 10/24/2015  . Absolute anemia   . Tobacco abuse   . Acute ischemic stroke ([place])   . Acute CVA  (cerebrovascular accident) ([place]) 05/31/2015  . Stroke ([place]) 05/31/2015  . Dyspnea 04/10/2014  . HTN (hypertension) 04/10/2014  . Inguinal hernia without mention of obstruction or gangrene, unilateral or unspecified, (not specified as recurrent)-right 01/30/2014    Past Surgical History:  Procedure Laterality Date  . APPENDECTOMY    . COLONOSCOPY    . COLONOSCOPY N/A 10/26/2015   Procedure: COLONOSCOPY;  Surgeon: Doran Stabler, MD;  Location: [facility] ENDOSCOPY;  Service: Endoscopy;  Laterality: N/A;  . ESOPHAGOGASTRODUODENOSCOPY    . ESOPHAGOGASTRODUODENOSCOPY N/A 10/26/2015   Procedure: ESOPHAGOGASTRODUODENOSCOPY (EGD);  Surgeon: Doran Stabler, MD;  Location: [facility] ENDOSCOPY;  Service: Endoscopy;  Laterality: N/A;  . INGUINAL HERNIA REPAIR Right 11/04/2014   Procedure: RIGHT INGUINAL HERNIA REPAIR WITH MESH;  Surgeon: Jackolyn Confer, MD;  Location: [facility];  Service: General;  Laterality: Right;  . MASS EXCISION N/A 11/04/2014   Procedure: REMOVAL OF RIGHT GROIN SOFT TISSUE MASS;  Surgeon: Jackolyn Confer, MD;  Location: [place] [facility];  Service: General;  Laterality: N/A;  . Multiple abdominal surgeries     total of 13  . PROSTATECTOMY    . SMALL INTESTINE SURGERY         Home Medications    Prior to Admission medications   Medication  Sig Start Date End Date Taking? Authorizing Provider  lisinopril (PRINIVIL,ZESTRIL) 40 MG tablet Take 1 tablet (40 mg total) by mouth daily. 09/08/16  Yes Deno Etienne, DO  buPROPion (WELLBUTRIN XL) 300 MG 24 hr tablet Take 1 tablet (300 mg total) by mouth daily. Patient not taking: Reported on 09/10/2016 01/21/16   Dorothyann Peng, NP  dexlansoprazole (DEXILANT) 60 MG capsule Take 60 mg by mouth daily.    Historical Provider, MD  diclofenac sodium (VOLTAREN) 1 % GEL Apply 2 g topically 3 (three) times daily as needed (Severe knee pain). Patient not taking: Reported on 09/10/2016 06/04/15   Geradine Girt, DO  ferrous sulfate 325 (65 FE) MG tablet Take 1 tablet (325 mg  total) by mouth daily with breakfast. Patient not taking: Reported on 09/10/2016 10/26/15   Delfina Redwood, MD  hydrochlorothiazide (HYDRODIURIL) 25 MG tablet Take 1 tablet (25 mg total) by mouth daily. 09/08/16   Deno Etienne, DO  HYDROcodone-acetaminophen (NORCO/VICODIN) 5-325 MG tablet Take 1 tablet by mouth every 6 (six) hours as needed for severe pain. Patient not taking: Reported on 09/10/2016 06/04/15   Geradine Girt, DO  isosorbide mononitrate (IMDUR) 60 MG 24 hr tablet Take 1 tablet (60 mg total) by mouth daily. Patient not taking: Reported on 09/10/2016 05/27/16   Carlyle Basques, MD  LORazepam (ATIVAN) 0.5 MG tablet Take 1 tablet (0.5 mg total) by mouth every 8 (eight) hours as needed for anxiety. Patient not taking: Reported on 09/10/2016 06/04/15   Geradine Girt, DO  nicotine (NICODERM CQ - DOSED IN MG/24 HOURS) 14 mg/24hr patch Place 1 patch (14 mg total) onto the skin daily. Patient not taking: Reported on 09/10/2016 06/04/15   Geradine Girt, DO  predniSONE (DELTASONE) 10 MG tablet Take 4 tablets by mouth for 2 days, 3 tablets by mouth for 2 days, 2 tablets by mouth for 2 days, and one tablet by mouth for 2 days. Patient not taking: Reported on 09/10/2016 12/08/15   Delano Metz, FNP  pregabalin (LYRICA) 150 MG capsule Take 150 mg by mouth 2 (two) times daily.    Historical Provider, MD  senna-docusate (SENOKOT-S) 8.6-50 MG tablet Take 1 tablet by mouth at bedtime as needed for moderate constipation. Patient not taking: Reported on 09/10/2016 06/04/15   Geradine Girt, DO  solifenacin (VESICARE) 5 MG tablet Take 5 mg by mouth at bedtime.     Historical Provider, MD  Suvorexant (BELSOMRA) 15 MG TABS Take 1 tablet by mouth at bedtime. Patient not taking: Reported on 09/10/2016 02/24/16   Dorothyann Peng, NP  zolpidem (AMBIEN) 10 MG tablet Take 1 tablet (10 mg total) by mouth at bedtime. Patient not taking: Reported on 09/10/2016 01/21/16   Dorothyann Peng, NP    Family History Family History    Problem Relation Age of Onset  . Alzheimer's disease Father   . Cancer Mother     Lymph node  . Heart failure Brother 45    Transplant 13 years ago  . CAD Brother   . Heart disease Sister 27    MI    Social History Social History  Substance Use Topics  . Smoking status: Current Every Day Smoker    Packs/day: 0.50    Years: 50.00    Types: Cigarettes  . Smokeless tobacco: Never Used  . Alcohol use 0.0 oz/week     Comment: occasionally     Allergies   Penicillins; Sudafed [pseudoephedrine]; Trazodone and nefazodone; and Tomato   Review of  Systems Review of Systems  Constitutional: Positive for diaphoresis. Negative for chills and fever.  HENT: Negative for ear pain and sore throat.   Eyes: Negative for pain and visual disturbance.  Respiratory: Negative for cough and shortness of breath.   Cardiovascular: Negative for chest pain and palpitations.  Gastrointestinal: Negative for abdominal pain and vomiting.  Genitourinary: Negative for dysuria and hematuria.  Musculoskeletal: Negative for arthralgias and back pain.  Skin: Negative for color change and rash.  Neurological: Positive for syncope. Negative for seizures.  All other systems reviewed and are negative.    Physical Exam Updated Vital Signs BP 132/72   Pulse (!) 59   Temp 98.1 F (36.7 C) (Oral)   Resp 16   SpO2 99%   Physical Exam  Constitutional: He appears well-developed and well-nourished.  HENT:  Head: Normocephalic and atraumatic.  Eyes: Conjunctivae are normal.  Neck: Neck supple.  Cardiovascular: Normal rate, regular rhythm and intact distal pulses.   Murmur (2/6 systolic murmur) heard. + Right carotid bruit  Pulmonary/Chest: Effort normal and breath sounds normal. No respiratory distress.  Abdominal: Soft. There is no tenderness.  Musculoskeletal: He exhibits no edema, tenderness or deformity.  Chronic mildly weakened left arm and left leg strength. At baseline per patient.   Neurological: He is alert.  Skin: Skin is warm and dry.  Psychiatric: He has a normal mood and affect.  Nursing note and vitals reviewed.    ED Treatments / Results  Labs (all labs ordered are listed, but only abnormal results are displayed) Labs Reviewed  BASIC METABOLIC PANEL - Abnormal; Notable for the following:       Result Value   Glucose, Bld 100 (*)    All other components within normal limits  CBC WITH DIFFERENTIAL/PLATELET - Abnormal; Notable for the following:    Hemoglobin 11.5 (*)    HCT 37.9 (*)    MCH 23.8 (*)    RDW 16.0 (*)    All other components within normal limits  TROPONIN I  URINALYSIS, ROUTINE W REFLEX MICROSCOPIC  CBG MONITORING, ED  I-STAT TROPOININ, ED    EKG  EKG Interpretation  Date/Time:  Friday September 10 2016 14:19:02 EST Ventricular Rate:  65 PR Interval:    QRS Duration: 90 QT Interval:  431 QTC Calculation: 449 R Axis:   -15 Text Interpretation:  Sinus rhythm Borderline prolonged PR interval Abnormal R-wave progression, early transition LVH with secondary repolarization abnormality Anterior ST elevation, probably due to LVH Confirmed by Reather Converse MD, JOSHUA [phone]) on 09/10/2016 3:14:48 PM       Radiology Dg Chest 2 View  Result Date: 09/08/2016 CLINICAL DATA:  Initial evaluation for acute shortness of breath. History of hypertension. EXAM: CHEST  2 VIEW COMPARISON:  Prior radiograph from 11/07/2015. FINDINGS: Cardiac and mediastinal silhouettes are within normal limits. Aortic atherosclerosis. Lungs normally inflated. No focal infiltrates. No pulmonary edema or pleural effusion. No pneumothorax. No acute osseus abnormality.  Hiatal hernia noted. IMPRESSION: 1. No active cardiopulmonary disease. 2. Hiatal hernia. 3. Aortic atherosclerosis. Electronically Signed   By: Jeannine Boga M.D.   On: 09/08/2016 21:04    Procedures Procedures (including critical care time)  Medications Ordered in ED Medications - No data to  display   Initial Impression / Assessment and Plan / ED Course  I have reviewed the triage vital signs and the nursing notes.  Pertinent labs & imaging results that were available during my care of the patient were reviewed by me and considered in  my medical decision making (see chart for details).    Patient is a 66 year old male with history as above who presents with syncope. Patient has had no chest pain or shortness of breath today. His EKG showed anterior ST elevation which was similar to prior. Thought to be secondary to LVH. His troponin was negative. Other labs within his recent range. Patient does have a heart murmur which has not been documented here during his recent visits, however the patient reports he has been told he has a murmur before at the [place]. He also had a right-sided bruit that was appreciated. He did have a echocardiogram and carotid Dopplers in November 2016. We initially recommended admission to the hospital for further syncope workup, however patient is really not interested in staying. He states he is going to follow-up with cardiology next week. We consulted cardiology who has made an appointment for him on Wednesday. Patient was given very strict return precautions. He was able to ambulate here in the ED as well as tolerate by mouth without difficulty. In the ED, he had no complaints. Occasional mild sinus bradycardia. This is a symptomatically. He was seen here 2 days ago for hypertension and started on clonidine when necessary. It is not clear if patient has taken any of this, but he was advised not to in the setting of his syncope. Patient was discharged in stable condition with strict return precautions.  Final Clinical Impressions(s) / ED Diagnoses   Final diagnoses:  Syncope and collapse    New Prescriptions Current Discharge Medication List       Clifton James, MD 09/10/16 [place]    Elnora Morrison, MD 09/10/16 2324

## 2016-09-15 ENCOUNTER — Ambulatory Visit: Payer: Medicare Other | Admitting: Physician Assistant

## 2016-09-15 ENCOUNTER — Encounter: Payer: Self-pay | Admitting: *Deleted

## 2016-09-15 NOTE — Progress Notes (Deleted)
Cardiology Office Note    Date:  09/15/2016   ID:  Jonathon, [name] 12/11/1950, MRN [id number]  PCP:  Dorothyann Peng, NP  Cardiologist:  new  No chief complaint on file.   History of Present Illness:  [name] is a 66 y.o. male with PMH of HTN, HLD h/o GIB and h/o CVA.He recently presented to the ED on 09/10/2016 with syncope. He was seen by infectious disease in November 2017 for evaluation and treatment of hepatitis C. However noted to have a blood pressure of 228/107 in the office. Apparently, he was not taking his medication as he was in the middle of a divorce and moved to homeless shelter and having financial troubles affording the medication. However since then he has restarted on blood pressure medications. She was given clonidine during the visit. Prior to the most recent ED visit, he was actually in the emergency room on 09/08/2016 for high blood pressure.  No EKG   Past Medical History:  Diagnosis Date  . Arthritis   . Chronic back pain   . GERD (gastroesophageal reflux disease)    uses Baking Soda  . GIB (gastrointestinal bleeding) 2012  . Glaucoma    right eye  . Headache    occasionally  . Hepatitis C   . History of blood transfusion    no abnormal reaction noted  . History of gout    doesn't take any meds  . Hyperlipidemia    not on any meds  . Hypertension    takes Amlodipine and Lisinopril daily  . Insomnia    takes Ambien nightly  . Ischemic colitis ([place]) 2012  . Joint pain   . Nocturia   . Numbness    both legs occasionally  . PONV (postoperative nausea and vomiting)   . Prostate cancer ([place])   . Shortness of breath dyspnea    rarely but when notices he can be lying/sitting/exertion.Dr.Hochrein is aware per pt  . Stroke ([place]) 2016   . Urinary frequency   . Urinary urgency     Past Surgical History:  Procedure Laterality Date  . APPENDECTOMY    . COLONOSCOPY    . COLONOSCOPY N/A 10/26/2015   Procedure: COLONOSCOPY;  Surgeon:  Doran Stabler, MD;  Location[facility] ENDOSCOPY;  Service: Endoscopy;  Laterality: N/A;  . ESOPHAGOGASTRODUODENOSCOPY    . ESOPHAGOGASTRODUODENOSCOPY N/A 10/26/2015   Procedure: ESOPHAGOGASTRODUODENOSCOPY (EGD);  Surgeon: Doran Stabler, MD;  Location: [facility] ENDOSCOPY;  Service: Endoscopy;  Laterality: N/A;  . INGUINAL HERNIA REPAIR Right 11/04/2014   Procedure: RIGHT INGUINAL HERNIA REPAIR WITH MESH;  Surgeon: Jackolyn Confer, MD;  Location: [facility];  Service: General;  Laterality: Right;  . MASS EXCISION N/A 11/04/2014   Procedure: REMOVAL OF RIGHT GROIN SOFT TISSUE MASS;  Surgeon: Jackolyn Confer, MD;  Location: [facility];  Service: General;  Laterality: N/A;  . Multiple abdominal surgeries     total of 13  . PROSTATECTOMY    . SMALL INTESTINE SURGERY      Current Medications: Outpatient Medications Prior to Visit  Medication Sig Dispense Refill  . buPROPion (WELLBUTRIN XL) 300 MG 24 hr tablet Take 1 tablet (300 mg total) by mouth daily. (Patient not taking: Reported on 09/10/2016) 90 tablet 2  . dexlansoprazole (DEXILANT) 60 MG capsule Take 60 mg by mouth daily.    . diclofenac sodium (VOLTAREN) 1 % GEL Apply 2 g topically 3 (three) times daily as needed (Severe knee pain). (Patient not taking:  Reported on 09/10/2016) 100 g 0  . ferrous sulfate 325 (65 FE) MG tablet Take 1 tablet (325 mg total) by mouth daily with breakfast. (Patient not taking: Reported on 09/10/2016) 30 tablet 1  . hydrochlorothiazide (HYDRODIURIL) 25 MG tablet Take 1 tablet (25 mg total) by mouth daily. 30 tablet 0  . HYDROcodone-acetaminophen (NORCO/VICODIN) 5-325 MG tablet Take 1 tablet by mouth every 6 (six) hours as needed for severe pain. (Patient not taking: Reported on 09/10/2016) 15 tablet 0  . isosorbide mononitrate (IMDUR) 60 MG 24 hr tablet Take 1 tablet (60 mg total) by mouth daily. (Patient not taking: Reported on 09/10/2016) 30 tablet 0  . lisinopril (PRINIVIL,ZESTRIL) 40 MG tablet Take 1 tablet (40 mg total) by mouth  daily. 30 tablet 0  . LORazepam (ATIVAN) 0.5 MG tablet Take 1 tablet (0.5 mg total) by mouth every 8 (eight) hours as needed for anxiety. (Patient not taking: Reported on 09/10/2016) 10 tablet 0  . nicotine (NICODERM CQ - DOSED IN MG/24 HOURS) 14 mg/24hr patch Place 1 patch (14 mg total) onto the skin daily. (Patient not taking: Reported on 09/10/2016) 28 patch 0  . predniSONE (DELTASONE) 10 MG tablet Take 4 tablets by mouth for 2 days, 3 tablets by mouth for 2 days, 2 tablets by mouth for 2 days, and one tablet by mouth for 2 days. (Patient not taking: Reported on 09/10/2016) 20 tablet 0  . pregabalin (LYRICA) 150 MG capsule Take 150 mg by mouth 2 (two) times daily.    Marland Kitchen senna-docusate (SENOKOT-S) 8.6-50 MG tablet Take 1 tablet by mouth at bedtime as needed for moderate constipation. (Patient not taking: Reported on 09/10/2016)    . solifenacin (VESICARE) 5 MG tablet Take 5 mg by mouth at bedtime.     . Suvorexant (BELSOMRA) 15 MG TABS Take 1 tablet by mouth at bedtime. (Patient not taking: Reported on 09/10/2016) 30 tablet 0  . zolpidem (AMBIEN) 10 MG tablet Take 1 tablet (10 mg total) by mouth at bedtime. (Patient not taking: Reported on 09/10/2016) 30 tablet 0   Facility-Administered Medications Prior to Visit  Medication Dose Route Frequency Provider Last Rate Last Dose  . pneumococcal 13-valent conjugate vaccine (PREVNAR 13) injection 0.5 mL  0.5 mL Intramuscular Tomorrow-1000 Dorothyann Peng, NP         Allergies:   Penicillins; Sudafed [pseudoephedrine]; Trazodone and nefazodone; and Tomato   Social History   Social History  . Marital status: Married    Spouse name: N/A  . Number of children: 2  . Years of education: N/A   Social History Main Topics  . Smoking status: Current Every Day Smoker    Packs/day: 0.50    Years: 50.00    Types: Cigarettes  . Smokeless tobacco: Never Used  . Alcohol use 0.0 oz/week     Comment: occasionally  . Drug use: No  . Sexual activity: Yes   Other  Topics Concern  . Not on file   Social History Narrative   One living child .  One murdered.  Lives with girlfriend.       Family History:  The patient's ***family history includes Alzheimer's disease in his father; CAD in his brother; Cancer in his mother; Heart disease (age of onset: 55) in his sister; Heart failure (age of onset: 72) in his brother.   ROS:   Please see the history of present illness.    ROS All other systems reviewed and are negative.   PHYSICAL EXAM:   VS:  There were no vitals taken for this visit.   GEN: Well nourished, well developed, in no acute distress  HEENT: normal  Neck: no JVD, carotid bruits, or masses Cardiac: ***RRR; no murmurs, rubs, or gallops,no edema  Respiratory:  clear to auscultation bilaterally, normal work of breathing GI: soft, nontender, nondistended, + BS MS: no deformity or atrophy  Skin: warm and dry, no rash Neuro:  Alert and Oriented x 3, Strength and sensation are intact Psych: euthymic mood, full affect  Wt Readings from Last 3 Encounters:  05/27/16 222 lb (100.7 kg)  02/13/16 222 lb 3.2 oz (100.8 kg)  01/21/16 221 lb (100.2 kg)      Studies/Labs Reviewed:   EKG:  EKG is*** ordered today.  The ekg ordered today demonstrates ***  Recent Labs: 02/13/2016: Magnesium 2.0; TSH 1.75 05/12/2016: ALT 16 09/10/2016: BUN 16; Creatinine, Ser 0.99; Hemoglobin 11.5; Platelets 307; Potassium 4.5; Sodium 139   Lipid Panel    Component Value Date/Time   CHOL 107 02/13/2016 0748   TRIG 128.0 02/13/2016 0748   HDL 33.60 (L) 02/13/2016 0748   CHOLHDL 3 02/13/2016 0748   VLDL 25.6 02/13/2016 0748   LDLCALC 48 02/13/2016 0748    Additional studies/ records that were reviewed today include:  ***    ASSESSMENT:    No diagnosis found.   PLAN:  In order of problems listed above:  1. ***    Medication Adjustments/Labs and Tests Ordered: Current medicines are reviewed at length with the patient today.  Concerns regarding  medicines are outlined above.  Medication changes, Labs and Tests ordered today are listed in the Patient Instructions below. There are no Patient Instructions on file for this visit.   Hilbert Corrigan, [place]  09/15/2016 6:16 AM    [facility] [place], [place], [place]  [address] Phone: [phone]; Fax: [phone]

## 2016-09-15 NOTE — Congregational Nurse Program (Signed)
Congregational Nurse Program Note  Date of Encounter: 09/14/2016  Past Medical History: Past Medical History:  Diagnosis Date  . Arthritis   . Chronic back pain   . GERD (gastroesophageal reflux disease)    uses Baking Soda  . GIB (gastrointestinal bleeding) 2012  . Glaucoma    right eye  . Headache    occasionally  . Hepatitis C   . History of blood transfusion    no abnormal reaction noted  . History of gout    doesn't take any meds  . Hyperlipidemia    not on any meds  . Hypertension    takes Amlodipine and Lisinopril daily  . Insomnia    takes Ambien nightly  . Ischemic colitis ([place]) 2012  . Joint pain   . Nocturia   . Numbness    both legs occasionally  . PONV (postoperative nausea and vomiting)   . Prostate cancer ([place])   . Shortness of breath dyspnea    rarely but when notices he can be lying/sitting/exertion.Dr.Hochrein is aware per pt  . Stroke ([place]) 2016   . Urinary frequency   . Urinary urgency     Encounter Details:     CNP Questionnaire - 09/15/16 1915      Patient Demographics   Is this a new or existing patient? Existing   Patient is considered a/an Not Applicable   Race African-American/Black     Patient Assistance   Location of Patient Assistance Not Applicable   Patient's financial/insurance status Medicaid;Medicare   Uninsured Patient ([place]/Care Connects) No   Patient referred to apply for the following financial assistance Not Applicable   Food insecurities addressed Not Applicable   Transportation assistance No   Assistance securing medications No   Educational health offerings Medications;Chronic disease;Hypertension     Encounter Details   Primary purpose of visit Navigating the Healthcare System;Education/Health Concerns;Chronic Illness/Condition Visit   Was an Emergency Department visit averted? Yes   Does patient have a medical provider? No   Patient referred to Establish PCP;Area Agency   Was a mental health screening  completed? (GAINS tool) No   Does patient have dental issues? No   Does patient have vision issues? No   Does your patient have an abnormal blood pressure today? Yes   Since previous encounter, have you referred patient for abnormal blood pressure that resulted in a new diagnosis or medication change? No   Does your patient have an abnormal blood glucose today? No   Since previous encounter, have you referred patient for abnormal blood glucose that resulted in a new diagnosis or medication change? No   Was there a life-saving intervention made? No    client in for  Follow  Up  [place] getting his  Medication for his  High  Blood  Pressure. Blood  Pressure still  Very  High  tonight only  Down slightly. Nurse  Counseled  If  He  Gets  Into  Any  Shortness of  Breath or  Pain in arms ,chest  To  Call  911,he  Understands  That  And worries  sabout his  [place] higher on his  Credit  Report ,needs  Housing  He  States. He  Appears to  Be in no  Distress ,no signs  Of  Any  apin shortness of  Breathe. Clients  Has been off medication for a long  Time  And may need to  Have his  Medications  Evaluated . Referral made to  New  Medial clinic  Opening  Tomorrow  ,gave  Client referral  He  Will  Call and  Go  Tomorrow  Am he  States  And  Come  Back to see the nurse for  Monitoring . Stressed  Again importance of  Getting his blood  Pressure evaluated and the  Dangers.

## 2016-09-24 ENCOUNTER — Ambulatory Visit (INDEPENDENT_AMBULATORY_CARE_PROVIDER_SITE_OTHER): Payer: Medicare Other | Admitting: Pharmacist Clinician (PhC)/ Clinical Pharmacy Specialist

## 2016-09-24 DIAGNOSIS — B182 Chronic viral hepatitis C: Secondary | ICD-10-CM

## 2016-09-24 MED ORDER — GLECAPREVIR-PIBRENTASVIR 100-40 MG PO TABS: 3.0000 | ORAL_TABLET | Freq: Every day | ORAL | 1 refills | Status: DC
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
## 2016-09-27 ENCOUNTER — Telehealth: Payer: Self-pay | Admitting: Pharmacist Clinician (PhC)/ Clinical Pharmacy Specialist

## 2016-09-27 MED FILL — MAVYRET 100-40 MG TABS: 100-40 | 28 days supply | Qty: 84 | Fill #0

## 2016-09-27 NOTE — Telephone Encounter (Signed)
[name] is now approved for his Mavyret x 8 wks. He is going to bring all of his meds in on Wed so we can double check for interactions.

## 2016-09-29 ENCOUNTER — Ambulatory Visit: Payer: Medicare Other

## 2016-09-29 ENCOUNTER — Telehealth: Payer: Self-pay | Admitting: Pharmacist Clinician (PhC)/ Clinical Pharmacy Specialist

## 2016-09-29 NOTE — Telephone Encounter (Signed)
Needs to come to pick up his [place], more counseling, and interaction check.

## 2016-09-30 ENCOUNTER — Ambulatory Visit (INDEPENDENT_AMBULATORY_CARE_PROVIDER_SITE_OTHER): Payer: Medicare Other | Admitting: Pharmacist Clinician (PhC)/ Clinical Pharmacy Specialist

## 2016-09-30 DIAGNOSIS — B182 Chronic viral hepatitis C: Secondary | ICD-10-CM

## 2016-09-30 NOTE — Progress Notes (Addendum)
HPI: [name] is a 66 y.o. male who is here to review his meds for interactions before starting his hep C medication.   No results found for: HCVGENOTYPE, HEPCGENOTYPE  Allergies: Allergies  Allergen Reactions  . Penicillins Anaphylaxis    Has patient had a PCN reaction causing immediate rash, facial/tongue/throat swelling, SOB or lightheadedness with hypotension: Yes Has patient had a PCN reaction causing severe rash involving mucus membranes or skin necrosis: Unk Has patient had a PCN reaction that required hospitalization: Unk Has patient had a PCN reaction occurring within the last 10 years: No If all of the above answers are "NO", then may proceed with Cephalosporin use..  . Sudafed [Pseudoephedrine] Other (See Comments)    Heart "races"  . Trazodone And Nefazodone Swelling  . Tomato Rash and Other (See Comments)    Indigestion (also)    Vitals:    Past Medical History: Past Medical History:  Diagnosis Date  . Arthritis   . Chronic back pain   . GERD (gastroesophageal reflux disease)    uses Baking Soda  . GIB (gastrointestinal bleeding) 2012  . Glaucoma    right eye  . Headache    occasionally  . Hepatitis C   . History of blood transfusion    no abnormal reaction noted  . History of gout    doesn't take any meds  . Hyperlipidemia    not on any meds  . Hypertension    takes Amlodipine and Lisinopril daily  . Insomnia    takes Ambien nightly  . Ischemic colitis ([place]) 2012  . Joint pain   . Nocturia   . Numbness    both legs occasionally  . PONV (postoperative nausea and vomiting)   . Prostate cancer ([place])   . Shortness of breath dyspnea    rarely but when notices he can be lying/sitting/exertion.Dr.Hochrein is aware per pt  . Stroke ([place]) 2016   . Urinary frequency   . Urinary urgency     Social History: Social History   Social History  . Marital status: Married    Spouse name: N/A  . Number of children: 2  . Years of education: N/A    Social History Main Topics  . Smoking status: Current Every Day Smoker    Packs/day: 0.50    Years: 50.00    Types: Cigarettes  . Smokeless tobacco: Never Used  . Alcohol use 0.0 oz/week     Comment: occasionally  . Drug use: No  . Sexual activity: Yes   Other Topics Concern  . Not on file   Social History Narrative   One living child .  One murdered.  Lives with girlfriend.      Labs: Hep B S Ab (no units)  Date Value  05/12/2016 NEG   Hepatitis B Surface Ag (no units)  Date Value  05/12/2016 NEGATIVE    No results found for: HCVGENOTYPE, HEPCGENOTYPE  Hepatitis C RNA quantitative Latest Ref Rng & Units 10/25/2015  HCV Quantitative >50 IU/mL 442,000  HCV Quantitative Log >1.70 log10 IU/mL 5.645    AST (U/L)  Date Value  02/13/2016 19  10/26/2015 32  10/24/2015 19   ALT (U/L)  Date Value  05/12/2016 16  02/13/2016 18  10/26/2015 29  10/24/2015 18   INR (no units)  Date Value  05/12/2016 1.2 (H)  10/26/2015 1.37  05/30/2015 1.34    CrCl: CrCl cannot be calculated (Unknown ideal weight.).  Fibrosis Score: F3 as assessed by [employer]  Child-Pugh Score: Class A  Previous Treatment Regimen: Naive  Assessment: [place] was recently approved for [employer]. Brought him in here to go everything before starting his meds. After reviewing his current meds, no major interactions found. The first month supply was courier here so the supply was handed to him during this visit. Stressed the importance of not missing doses and take all 3 at the time time with food. Counseled on side effects. Due to his 1b virus, his cure rate will be very high especially without cirrhosis. He is going to come back and see me in 2 wks to a check up and at 1 month for labs and the second month supply. He will only need 2 months for length of therapy. After he saw the cost of the meds, I think he will be very compliant. He is going to bring back his $3.36 copay on Monday.    Recommendations:  Start Mavyret 3 PO qday with meal (3/15) F/u in 2 wks Make appt to f/u in a month for labs and 2 month supply  [place], [place].D., BCPS, AAHIVP Clinical Infectious [facility] 09/30/2016, 9:54 AM

## 2016-09-30 NOTE — Patient Instructions (Signed)
Start Mavyret 3 tablets daily with dinner Come back an see me on 3/29 at 11 AM

## 2016-10-05 ENCOUNTER — Encounter: Payer: Self-pay | Admitting: Pharmacy Technician

## 2016-10-14 ENCOUNTER — Ambulatory Visit (INDEPENDENT_AMBULATORY_CARE_PROVIDER_SITE_OTHER): Payer: Medicare Other | Admitting: Pharmacist Clinician (PhC)/ Clinical Pharmacy Specialist

## 2016-10-14 DIAGNOSIS — B182 Chronic viral hepatitis C: Secondary | ICD-10-CM

## 2016-10-14 NOTE — Patient Instructions (Signed)
Continue your mavyret for 2 months Come back and see me in 2 wks for labs and pick your second month supply

## 2016-10-14 NOTE — Progress Notes (Signed)
HPI: [name] is a 66 y.o. male who is here for his 2 wks visit with pharmacy for his hep C.   No results found for: HCVGENOTYPE, HEPCGENOTYPE  Allergies: Allergies  Allergen Reactions  . Penicillins Anaphylaxis    Has patient had a PCN reaction causing immediate rash, facial/tongue/throat swelling, SOB or lightheadedness with hypotension: Yes Has patient had a PCN reaction causing severe rash involving mucus membranes or skin necrosis: Unk Has patient had a PCN reaction that required hospitalization: Unk Has patient had a PCN reaction occurring within the last 10 years: No If all of the above answers are "NO", then may proceed with Cephalosporin use..  . Sudafed [Pseudoephedrine] Other (See Comments)    Heart "races"  . Trazodone And Nefazodone Swelling  . Tomato Rash and Other (See Comments)    Indigestion (also)    Vitals:    Past Medical History: Past Medical History:  Diagnosis Date  . Arthritis   . Chronic back pain   . GERD (gastroesophageal reflux disease)    uses Baking Soda  . GIB (gastrointestinal bleeding) 2012  . Glaucoma    right eye  . Headache    occasionally  . Hepatitis C   . History of blood transfusion    no abnormal reaction noted  . History of gout    doesn't take any meds  . Hyperlipidemia    not on any meds  . Hypertension    takes Amlodipine and Lisinopril daily  . Insomnia    takes Ambien nightly  . Ischemic colitis ([place]) 2012  . Joint pain   . Nocturia   . Numbness    both legs occasionally  . PONV (postoperative nausea and vomiting)   . Prostate cancer ([place])   . Shortness of breath dyspnea    rarely but when notices he can be lying/sitting/exertion.Dr.Hochrein is aware per pt  . Stroke ([place]) 2016   . Urinary frequency   . Urinary urgency     Social History: Social History   Social History  . Marital status: Married    Spouse name: N/A  . Number of children: 2  . Years of education: N/A   Social History Main  Topics  . Smoking status: Current Every Day Smoker    Packs/day: 0.50    Years: 50.00    Types: Cigarettes  . Smokeless tobacco: Never Used  . Alcohol use 0.0 oz/week     Comment: occasionally  . Drug use: No  . Sexual activity: Yes   Other Topics Concern  . Not on file   Social History Narrative   One living child .  One murdered.  Lives with girlfriend.      Labs: Hep B S Ab (no units)  Date Value  05/12/2016 NEG   Hepatitis B Surface Ag (no units)  Date Value  05/12/2016 NEGATIVE    No results found for: HCVGENOTYPE, HEPCGENOTYPE  Hepatitis C RNA quantitative Latest Ref Rng & Units 10/25/2015  HCV Quantitative >50 IU/mL 442,000  HCV Quantitative Log >1.70 log10 IU/mL 5.645    AST (U/L)  Date Value  02/13/2016 19  10/26/2015 32  10/24/2015 19   ALT (U/L)  Date Value  05/12/2016 16  02/13/2016 18  10/26/2015 29  10/24/2015 18   INR (no units)  Date Value  05/12/2016 1.2 (H)  10/26/2015 1.37  05/30/2015 1.34    CrCl: CrCl cannot be calculated (Patient's most recent lab result is older than the maximum 21 days allowed.).  Fibrosis Score: F3 as assessed by Fibrosure  Child-Pugh Score: Class A  Previous Treatment Regimen: None  Assessment: Sumedh started on his Mavyret on 3/15 so he is about 2 wks in. Takes 3 tabs at dinner. He has not missed any doses and has had no side effects. Mardy has stayed off of alcohol since he is on treatment. Encouraged him to stay off of it even after he is cure. He was thinking that he could resume some of his drinking after the cure. He appreciated that I told to avoid alcohol to decrease the risk of progression. He is going to come back in 2 wks for labs and pick up his second month supply here. He only needs 2 mo.   Recommendations:  Cont Mavyret 3 tabs daily Come in 2 wks for labs and 2nd mo supply We'll schedule the EOT and cure visit at the next visit  Minh Pham, Pharm.D., BCPS, AAHIVP Clinical Infectious  [facility] 10/14/2016, 10:20 AM

## 2016-10-21 MED FILL — MAVYRET 100-40 MG TABS: 100-40 | 28 days supply | Qty: 84 | Fill #1

## 2016-10-25 ENCOUNTER — Ambulatory Visit (INDEPENDENT_AMBULATORY_CARE_PROVIDER_SITE_OTHER): Payer: Medicare Other | Admitting: Pharmacist Clinician (PhC)/ Clinical Pharmacy Specialist

## 2016-10-25 DIAGNOSIS — Z23 Encounter for immunization: Secondary | ICD-10-CM | POA: Diagnosis not present

## 2016-10-25 DIAGNOSIS — B182 Chronic viral hepatitis C: Secondary | ICD-10-CM

## 2016-10-25 NOTE — Patient Instructions (Addendum)
Elizebeth Koller out your second month of [place] back and see me in 1 month on 11/23/16
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
## 2016-10-27 LAB — HEPATITIS C RNA QUANTITATIVE
HCV Quantitative Log: <1.18 Log IU/mL
HCV Quantitative: <15 IU/mL

## 2016-11-23 ENCOUNTER — Other Ambulatory Visit: Payer: Self-pay | Admitting: *Deleted

## 2016-11-23 ENCOUNTER — Ambulatory Visit (INDEPENDENT_AMBULATORY_CARE_PROVIDER_SITE_OTHER): Payer: Medicare Other | Admitting: Pharmacist

## 2016-11-23 DIAGNOSIS — I10 Essential (primary) hypertension: Secondary | ICD-10-CM

## 2016-11-23 DIAGNOSIS — B182 Chronic viral hepatitis C: Secondary | ICD-10-CM | POA: Diagnosis not present

## 2016-11-23 DIAGNOSIS — Z23 Encounter for immunization: Secondary | ICD-10-CM | POA: Diagnosis not present

## 2016-11-23 MED ORDER — CLONIDINE HCL 0.1 MG PO TABS: 0.1000 mg | ORAL_TABLET | Freq: Every day | ORAL | 1 refills | Status: DC

## 2016-11-23 MED ORDER — LISINOPRIL 40 MG PO TABS: 40.0000 mg | ORAL_TABLET | Freq: Every day | ORAL | 1 refills | Status: DC

## 2016-11-23 NOTE — Progress Notes (Signed)
HPI: [name] is a 66 y.o. male who presents to the [facility] clinic for Hep C follow-up.  He is genotype 1b, F3, and started 8 weeks of Mavyret ~09/30/16.   No results found for: HCVGENOTYPE, HEPCGENOTYPE  Allergies: Allergies  Allergen Reactions  . Penicillins Anaphylaxis    Has patient had a PCN reaction causing immediate rash, facial/tongue/throat swelling, SOB or lightheadedness with hypotension: Yes Has patient had a PCN reaction causing severe rash involving mucus membranes or skin necrosis: Unk Has patient had a PCN reaction that required hospitalization: Unk Has patient had a PCN reaction occurring within the last 10 years: No If all of the above answers are "NO", then may proceed with Cephalosporin use..  . Sudafed [Pseudoephedrine] Other (See Comments)    Heart "races"  . Trazodone And Nefazodone Swelling  . Tomato Rash and Other (See Comments)    Indigestion (also)    Past Medical History: Past Medical History:  Diagnosis Date  . Arthritis   . Chronic back pain   . GERD (gastroesophageal reflux disease)    uses Baking Soda  . GIB (gastrointestinal bleeding) 2012  . Glaucoma    right eye  . Headache    occasionally  . Hepatitis C   . History of blood transfusion    no abnormal reaction noted  . History of gout    doesn't take any meds  . Hyperlipidemia    not on any meds  . Hypertension    takes Amlodipine and Lisinopril daily  . Insomnia    takes Ambien nightly  . Ischemic colitis ([place]) 2012  . Joint pain   . Nocturia   . Numbness    both legs occasionally  . PONV (postoperative nausea and vomiting)   . Prostate cancer ([place])   . Shortness of breath dyspnea    rarely but when notices he can be lying/sitting/exertion.Dr.Hochrein is aware per pt  . Stroke ([place]) 2016   . Urinary frequency   . Urinary urgency     Social History: Social History   Social History  . Marital status: Married    Spouse name: N/A  . Number of children: 2  .  Years of education: N/A   Social History Main Topics  . Smoking status: Current Every Day Smoker    Packs/day: 0.50    Years: 50.00    Types: Cigarettes  . Smokeless tobacco: Never Used  . Alcohol use 0.0 oz/week     Comment: occasionally  . Drug use: No  . Sexual activity: Yes   Other Topics Concern  . Not on file   Social History Narrative   One living child .  One murdered.  Lives with girlfriend.      Labs: Hep B S Ab (no units)  Date Value  05/12/2016 NEG   Hepatitis B Surface Ag (no units)  Date Value  05/12/2016 NEGATIVE    No results found for: HCVGENOTYPE, HEPCGENOTYPE  Hepatitis C RNA quantitative Latest Ref Rng & Units 10/25/2016 10/25/2015  HCV Quantitative NOT DETECTED IU/mL <15 NOT DETECTED 442,000  HCV Quantitative Log NOT DETECTED Log IU/mL <1.18 NOT DETECTED 5.645    AST (U/L)  Date Value  02/13/2016 19  10/26/2015 32  10/24/2015 19   ALT (U/L)  Date Value  05/12/2016 16  02/13/2016 18  10/26/2015 29  10/24/2015 18   INR (no units)  Date Value  05/12/2016 1.2 (H)  10/26/2015 1.37  05/30/2015 1.34    CrCl: CrCl cannot  be calculated (Patient's most recent lab result is older than the maximum 21 days allowed.).  Fibrosis Score: F3 as assessed by fibrosure   Child-Pugh Score: A  Previous Treatment Regimen: None  Assessment: [name] is here today for Hep C follow-up.  He has one day left (today) of his 8 weeks of [place].  He is doing well. He tells me that he missed one dose but upon further examination, he actually just took it a few hours late. He usually takes it around 5-7pm in the evening.  He is not having any issues tolerating it at all - no diarrhea, nausea, vomiting, headaches, etc. He also tells me today he is out of his BP medications and is trying to get a new PCP. I told him I would refill his BP medications this one time so he could continue them until he gets a new PCP.  I refilled his clonidine and lisinopril.  We will get a  EOT Hep C VL today. I will also give him his 2nd Hep B vaccine, make his cure visits, and his last Hep B vaccine visit.    Plans: - Continue Mavyret x 8 weeks - EOT Hep C VL today - SVR12 pharmacy visit (do labs same day) 8/8 at 10am - 3rd Hep B vaccine 10/8 at [place]. [place], PharmD, [facility] 11/24/2016, 9:08 AM

## 2016-11-25 LAB — HEPATITIS C RNA QUANTITATIVE
HCV Quantitative Log: <1.18 Log IU/mL
HCV Quantitative: <15 IU/mL

## 2017-01-03 ENCOUNTER — Emergency Department (HOSPITAL_COMMUNITY): Payer: Medicare Other

## 2017-01-03 ENCOUNTER — Emergency Department (HOSPITAL_COMMUNITY)
Admission: EM | Admit: 2017-01-03 | Discharge: 2017-01-03 | Discharge status: Against Medical Advice | Payer: Medicare Other | Attending: Emergency Medicine | Admitting: Emergency Medicine

## 2017-01-03 ENCOUNTER — Encounter (HOSPITAL_COMMUNITY): Payer: Self-pay | Admitting: Emergency Medicine

## 2017-01-03 DIAGNOSIS — Y999 Unspecified external cause status: Secondary | ICD-10-CM | POA: Insufficient documentation

## 2017-01-03 DIAGNOSIS — I10 Essential (primary) hypertension: Secondary | ICD-10-CM | POA: Diagnosis not present

## 2017-01-03 DIAGNOSIS — Z8546 Personal history of malignant neoplasm of prostate: Secondary | ICD-10-CM | POA: Diagnosis not present

## 2017-01-03 DIAGNOSIS — Y929 Unspecified place or not applicable: Secondary | ICD-10-CM | POA: Diagnosis not present

## 2017-01-03 DIAGNOSIS — S0502XA Injury of conjunctiva and corneal abrasion without foreign body, left eye, initial encounter: Secondary | ICD-10-CM | POA: Insufficient documentation

## 2017-01-03 DIAGNOSIS — Z760 Encounter for issue of repeat prescription: Secondary | ICD-10-CM | POA: Diagnosis not present

## 2017-01-03 DIAGNOSIS — S0591XA Unspecified injury of right eye and orbit, initial encounter: Secondary | ICD-10-CM | POA: Diagnosis present

## 2017-01-03 DIAGNOSIS — Y939 Activity, unspecified: Secondary | ICD-10-CM | POA: Insufficient documentation

## 2017-01-03 DIAGNOSIS — Z8673 Personal history of transient ischemic attack (TIA), and cerebral infarction without residual deficits: Secondary | ICD-10-CM | POA: Diagnosis not present

## 2017-01-03 DIAGNOSIS — Z79899 Other long term (current) drug therapy: Secondary | ICD-10-CM | POA: Diagnosis not present

## 2017-01-03 DIAGNOSIS — X58XXXA Exposure to other specified factors, initial encounter: Secondary | ICD-10-CM | POA: Diagnosis not present

## 2017-01-03 DIAGNOSIS — F17210 Nicotine dependence, cigarettes, uncomplicated: Secondary | ICD-10-CM | POA: Diagnosis not present

## 2017-01-03 DIAGNOSIS — H1033 Unspecified acute conjunctivitis, bilateral: Secondary | ICD-10-CM | POA: Insufficient documentation

## 2017-01-03 LAB — CBC
HCT: 33.3 % — ABNORMAL LOW (ref 39.0–52.0)
Hemoglobin: 9.9 g/dL — ABNORMAL LOW (ref 13.0–17.0)
MCH: 23 pg — ABNORMAL LOW (ref 26.0–34.0)
MCHC: 29.7 g/dL — ABNORMAL LOW (ref 30.0–36.0)
MCV: 77.3 fL — ABNORMAL LOW (ref 78.0–100.0)
Platelets: 383 10*3/uL (ref 150–400)
RBC: 4.31 MIL/uL (ref 4.22–5.81)
RDW: 15.3 % (ref 11.5–15.5)
WBC: 6.4 10*3/uL (ref 4.0–10.5)

## 2017-01-03 LAB — BASIC METABOLIC PANEL
Anion gap: 9 (ref 5–15)
BUN: 10 mg/dL (ref 6–20)
CO2: 24 mmol/L (ref 22–32)
Calcium: 9 mg/dL (ref 8.9–10.3)
Chloride: 105 mmol/L (ref 101–111)
Creatinine, Ser: 0.8 mg/dL (ref 0.61–1.24)
GFR calc Af Amer: >60 mL/min (ref >60)
GFR calc non Af Amer: >60 mL/min (ref >60)
Glucose, Bld: 100 mg/dL — ABNORMAL HIGH (ref 65–99)
Potassium: 3.7 mmol/L (ref 3.5–5.1)
Sodium: 138 mmol/L (ref 135–145)

## 2017-01-03 LAB — TROPONIN I
Troponin I: 0.03 ng/mL (ref <0.03)
Troponin I: <0.03 ng/mL (ref <0.03)

## 2017-01-03 MED ORDER — CLINDAMYCIN HCL 150 MG PO CAPS: 450.0000 mg | ORAL_CAPSULE | Freq: Four times a day (QID) | ORAL | 0 refills | Status: DC

## 2017-01-03 MED ORDER — LISINOPRIL 40 MG PO TABS: 40.0000 mg | ORAL_TABLET | Freq: Every day | ORAL | 0 refills | Status: DC

## 2017-01-03 MED ORDER — HYDROCHLOROTHIAZIDE 25 MG PO TABS: 25.0000 mg | ORAL_TABLET | Freq: Every day | ORAL | 0 refills | Status: DC

## 2017-01-03 MED ORDER — ERYTHROMYCIN 5 MG/GM OP OINT: TOPICAL_OINTMENT | OPHTHALMIC | 0 refills | Status: DC

## 2017-01-03 MED ORDER — LISINOPRIL 20 MG PO TABS
40.0000 mg | ORAL_TABLET | Freq: Once | ORAL | Status: AC
  Administered 2017-01-03: 40 mg via ORAL
  Filled 2017-01-03: qty 2

## 2017-01-03 MED ORDER — CLONIDINE HCL 0.1 MG PO TABS: 0.1000 mg | ORAL_TABLET | Freq: Every day | ORAL | 0 refills | Status: DC

## 2017-01-03 MED ORDER — TETRACAINE HCL 0.5 % OP SOLN
2.0000 [drp] | Freq: Once | OPHTHALMIC | Status: AC
  Administered 2017-01-03: 2 [drp] via OPHTHALMIC
  Filled 2017-01-03: qty 4

## 2017-01-03 MED ORDER — FLUORESCEIN SODIUM 0.6 MG OP STRP
1.0000 | ORAL_STRIP | Freq: Once | OPHTHALMIC | Status: AC
  Administered 2017-01-03: 1 via OPHTHALMIC
  Filled 2017-01-03: qty 1

## 2017-01-03 MED ORDER — HYDROCHLOROTHIAZIDE 25 MG PO TABS
25.0000 mg | ORAL_TABLET | Freq: Once | ORAL | Status: AC
  Administered 2017-01-03: 25 mg via ORAL
  Filled 2017-01-03: qty 1

## 2017-01-03 MED ORDER — CLINDAMYCIN HCL 150 MG PO CAPS: 450.0000 mg | ORAL_CAPSULE | Freq: Three times a day (TID) | ORAL | 0 refills | Status: DC

## 2017-01-03 MED ORDER — CLONIDINE HCL 0.1 MG PO TABS
0.1000 mg | ORAL_TABLET | Freq: Once | ORAL | Status: AC
  Administered 2017-01-03: 0.1 mg via ORAL
  Filled 2017-01-03: qty 1

## 2017-01-03 MED ORDER — ASPIRIN 81 MG PO CHEW
324.0000 mg | CHEWABLE_TABLET | Freq: Once | ORAL | Status: AC
Start: 2017-01-03 — End: 2017-01-03
  Administered 2017-01-03: 324 mg via ORAL
  Filled 2017-01-03: qty 4

## 2017-01-03 MED FILL — cloNIDine HCL 0.1 MG TABS: 0.1 | 30 days supply | Qty: 30 | Fill #0

## 2017-01-03 MED FILL — LISINOPRIL 40 MG TABLET: 40 | 30 days supply | Qty: 30 | Fill #0

## 2017-01-03 MED FILL — ERYTHROMYCIN EYE OINTMENT: 5 | 30 days supply | Qty: 4 | Fill #0

## 2017-01-03 MED FILL — HYDROCHLOROTHIAZIDE 25 MG T: 25 | 30 days supply | Qty: 30 | Fill #0

## 2017-01-03 MED FILL — CLINDAMYCIN HCL 150 MG CAPS: 150 | 7 days supply | Qty: 63 | Fill #0

## 2017-01-03 NOTE — ED Notes (Signed)
Patient transported to X-ray 

## 2017-01-03 NOTE — ED Notes (Signed)
ED Provider at bedside. 

## 2017-01-03 NOTE — ED Triage Notes (Signed)
Pt reports L eye pain onset 2200 last night while watching TV. States tried to rinse it out but no relief. Blurred vision and red sclera L eye, yellow drainage. Moving to R eye now. Concerned there's something in it. Also hypertensive in triage, has been out of BP meds for two weeks.

## 2017-01-03 NOTE — ED Provider Notes (Signed)
[facility] DEPT Provider Note   CSN: [id number] Arrival date & time: 01/03/17  0416     History   Chief Complaint Chief Complaint  Patient presents with  . Eye Pain  . Medication Refill    HPI [name] is a 66 y.o. male.  HPI 66 year old African-American male past medical history significant for hypertension, hyperlipidemia presents to the ED today with complaints of bilateral eye drainage, redness, pain. The patient states that he was watching TV last night and had pain in the left side of his eye that didn't spread to the right eye. He also notes purulent drainage bilaterally. States that his eyes are painful and itchy. He also reports redness. Patient reports nasal congestion and drainage. Denies any fever or sick contacts. States that his vision is slightly blurry. He does wear glasses but denies any contact use. States that "it feels like something is in my eye". Patient also states that his been out of his blood pressure medicine for the past 2 weeks because he cannot afford it. Blood pressure elevated in triage. Patient denies any associated complaints. The patient is not tried nothing for his symptoms. Nothing makes better or worse. tdap utd.  Pt denies any fever, chill, ha, lightheadedness, dizziness, congestion, neck pain, cp, sob, cough, abd pain, n/v/d, urinary symptoms, change in bowel habits, melena, hematochezia, lower extremity paresthesias.  Past Medical History:  Diagnosis Date  . Arthritis   . Chronic back pain   . GERD (gastroesophageal reflux disease)    uses Baking Soda  . GIB (gastrointestinal bleeding) 2012  . Glaucoma    right eye  . Headache    occasionally  . Hepatitis C   . History of blood transfusion    no abnormal reaction noted  . History of gout    doesn't take any meds  . Hyperlipidemia    not on any meds  . Hypertension    takes Amlodipine and Lisinopril daily  . Insomnia    takes Ambien nightly  . Ischemic colitis ([place])  2012  . Joint pain   . Nocturia   . Numbness    both legs occasionally  . PONV (postoperative nausea and vomiting)   . Prostate cancer ([place])   . Shortness of breath dyspnea    rarely but when notices he can be lying/sitting/exertion.Dr.Hochrein is aware per pt  . Stroke ([place]) 2016   . Urinary frequency   . Urinary urgency     Patient Active Problem List   Diagnosis Date Noted  . Anemia, iron deficiency   . Gastrointestinal bleeding 10/24/2015  . Absolute anemia   . Tobacco abuse   . Acute ischemic stroke ([place])   . Acute CVA (cerebrovascular accident) ([place]) 05/31/2015  . Stroke ([place]) 05/31/2015  . Dyspnea 04/10/2014  . HTN (hypertension) 04/10/2014  . Inguinal hernia without mention of obstruction or gangrene, unilateral or unspecified, (not specified as recurrent)-right 01/30/2014    Past Surgical History:  Procedure Laterality Date  . APPENDECTOMY    . COLONOSCOPY    . COLONOSCOPY N/A 10/26/2015   Procedure: COLONOSCOPY;  Surgeon: Doran Stabler, MD;  Location: [facility] ENDOSCOPY;  Service: Endoscopy;  Laterality: N/A;  . ESOPHAGOGASTRODUODENOSCOPY    . ESOPHAGOGASTRODUODENOSCOPY N/A 10/26/2015   Procedure: ESOPHAGOGASTRODUODENOSCOPY (EGD);  Surgeon: Doran Stabler, MD;  Location[facility] ENDOSCOPY;  Service: Endoscopy;  Laterality: N/A;  . INGUINAL HERNIA REPAIR Right 11/04/2014   Procedure: RIGHT INGUINAL HERNIA REPAIR WITH MESH;  Surgeon: Jackolyn Confer, MD;  Location: [facility] OR;  Service: General;  Laterality: Right;  . MASS EXCISION N/A 11/04/2014   Procedure: REMOVAL OF RIGHT GROIN SOFT TISSUE MASS;  Surgeon: Jackolyn Confer, MD;  Location: [facility];  Service: General;  Laterality: N/A;  . Multiple abdominal surgeries     total of 13  . PROSTATECTOMY    . SMALL INTESTINE SURGERY         Home Medications    Prior to Admission medications   Medication Sig Start Date End Date Taking? Authorizing Provider  buPROPion (WELLBUTRIN XL) 300 MG 24 hr tablet Take 1 tablet (300 mg  total) by mouth daily. Patient not taking: Reported on 09/10/2016 01/21/16   Dorothyann Peng, NP  cloNIDine (CATAPRES) 0.1 MG tablet Take 1 tablet (0.1 mg total) by mouth daily. 11/23/16   Carlyle Basques, MD  dexlansoprazole (DEXILANT) 60 MG capsule Take 60 mg by mouth daily.    [provider]  diclofenac sodium (VOLTAREN) 1 % GEL Apply 2 g topically 3 (three) times daily as needed (Severe knee pain). Patient not taking: Reported on 09/10/2016 06/04/15   Geradine Girt, DO  ferrous sulfate 325 (65 FE) MG tablet Take 1 tablet (325 mg total) by mouth daily with breakfast. Patient not taking: Reported on 09/10/2016 10/26/15   Delfina Redwood, MD  Glecaprevir-Pibrentasvir (MAVYRET) 100-40 MG TABS Take 3 tablets by mouth daily with breakfast. 09/24/16   Carlyle Basques, MD  hydrochlorothiazide (HYDRODIURIL) 25 MG tablet Take 1 tablet (25 mg total) by mouth daily. 09/08/16   Deno Etienne, DO  HYDROcodone-acetaminophen (NORCO/VICODIN) 5-325 MG tablet Take 1 tablet by mouth every 6 (six) hours as needed for severe pain. Patient not taking: Reported on 09/10/2016 06/04/15   Geradine Girt, DO  isosorbide mononitrate (IMDUR) 60 MG 24 hr tablet Take 1 tablet (60 mg total) by mouth daily. Patient not taking: Reported on 09/30/2016 05/27/16   Carlyle Basques, MD  lisinopril (PRINIVIL,ZESTRIL) 40 MG tablet Take 1 tablet (40 mg total) by mouth daily. 11/23/16   Carlyle Basques, MD  LORazepam (ATIVAN) 0.5 MG tablet Take 1 tablet (0.5 mg total) by mouth every 8 (eight) hours as needed for anxiety. Patient not taking: Reported on 09/10/2016 06/04/15   Geradine Girt, DO  nicotine (NICODERM CQ - DOSED IN MG/24 HOURS) 14 mg/24hr patch Place 1 patch (14 mg total) onto the skin daily. Patient not taking: Reported on 09/10/2016 06/04/15   Geradine Girt, DO  predniSONE (DELTASONE) 10 MG tablet Take 4 tablets by mouth for 2 days, 3 tablets by mouth for 2 days, 2 tablets by mouth for 2 days, and one tablet by mouth for 2  days. Patient not taking: Reported on 09/10/2016 12/08/15   Delano Metz, FNP  pregabalin (LYRICA) 150 MG capsule Take 150 mg by mouth 2 (two) times daily.    [provider]  senna-docusate (SENOKOT-S) 8.6-50 MG tablet Take 1 tablet by mouth at bedtime as needed for moderate constipation. Patient not taking: Reported on 09/10/2016 06/04/15   Geradine Girt, DO  solifenacin (VESICARE) 5 MG tablet Take 5 mg by mouth at bedtime.     [provider]  Suvorexant (BELSOMRA) 15 MG TABS Take 1 tablet by mouth at bedtime. Patient not taking: Reported on 09/10/2016 02/24/16   Dorothyann Peng, NP  zolpidem (AMBIEN) 10 MG tablet Take 1 tablet (10 mg total) by mouth at bedtime. Patient not taking: Reported on 09/10/2016 01/21/16   Dorothyann Peng, NP    Family History Family  History  Problem Relation Age of Onset  . Alzheimer's disease Father   . Cancer Mother        Lymph node  . Heart failure Brother 45       Transplant 13 years ago  . CAD Brother   . Heart disease Sister 39       MI    Social History Social History  Substance Use Topics  . Smoking status: Current Every Day Smoker    Packs/day: 0.50    Years: 50.00    Types: Cigarettes  . Smokeless tobacco: Never Used  . Alcohol use 0.0 oz/week     Comment: occasionally     Allergies   Penicillins; Sudafed [pseudoephedrine]; Trazodone and nefazodone; and Tomato   Review of Systems Review of Systems  Constitutional: Negative for chills and fever.  HENT: Positive for congestion and rhinorrhea. Negative for sore throat.   Eyes: Positive for pain, discharge, redness, itching and visual disturbance.  Respiratory: Negative for cough and shortness of breath.   Cardiovascular: Negative for chest pain.  Gastrointestinal: Negative for abdominal pain, diarrhea, nausea and vomiting.  Genitourinary: Negative for dysuria, flank pain, frequency, hematuria and urgency.  Musculoskeletal: Negative for arthralgias and myalgias.    Skin: Negative for rash.  Neurological: Negative for dizziness, syncope, weakness, light-headedness, numbness and headaches.  Psychiatric/Behavioral: Negative for sleep disturbance. The patient is not nervous/anxious.      Physical Exam Updated Vital Signs BP (!) 176/76   Pulse (!) 54   Temp 97.6 F (36.4 C) (Oral)   Resp 16   Ht 5' 9.5" (1.765 m)   Wt 97.5 kg (215 lb)   SpO2 99%   BMI 31.29 kg/m   Physical Exam  Constitutional: He is oriented to person, place, and time. He appears well-developed and well-nourished.  Non-toxic appearance. No distress.  HENT:  Head: Normocephalic and atraumatic.  Mouth/Throat: Oropharynx is clear and moist.  Eyes: EOM and lids are normal. Pupils are equal, round, and reactive to light. Lids are everted and swept, no foreign bodies found. Right eye exhibits discharge (purulent). No foreign body present in the right eye. Left eye exhibits discharge (purulent). No foreign body present in the left eye. Right conjunctiva is injected. Left conjunctiva is injected.  Slit lamp exam:      The right eye shows no corneal abrasion, no foreign body and no fluorescein uptake.       The left eye shows corneal abrasion and fluorescein uptake. The left eye shows no foreign body.  IOP Left eye 20, right eye 17  Neck: Normal range of motion. Neck supple.  Cardiovascular: Normal rate, regular rhythm, normal heart sounds and intact distal pulses.  Exam reveals no gallop and no friction rub.   No murmur heard. Pulmonary/Chest: Effort normal and breath sounds normal. No respiratory distress. He exhibits no tenderness.  Abdominal: Soft. Bowel sounds are normal. He exhibits no distension. There is no tenderness. There is no rebound and no guarding.  Musculoskeletal: Normal range of motion. He exhibits no tenderness.  Lymphadenopathy:    He has no cervical adenopathy.  Neurological: He is alert and oriented to person, place, and time.  Skin: Skin is warm and dry.  Capillary refill takes less than 2 seconds. No rash noted.  Psychiatric: His behavior is normal. Judgment and thought content normal.  Nursing note and vitals reviewed.    ED Treatments / Results  Labs (all labs ordered are listed, but only abnormal results are displayed) Labs Reviewed  CBC - Abnormal; Notable for the following:       Result Value   Hemoglobin 9.9 (*)    HCT 33.3 (*)    MCV 77.3 (*)    MCH 23.0 (*)    MCHC 29.7 (*)    All other components within normal limits  BASIC METABOLIC PANEL - Abnormal; Notable for the following:    Glucose, Bld 100 (*)    All other components within normal limits    EKG  EKG Interpretation  Date/Time:  Monday January 03 2017 07:01:03 EDT Ventricular Rate:  66 PR Interval:    QRS Duration: 98 QT Interval:  454 QTC Calculation: 476 R Axis:     Text Interpretation:  Sinus rhythm Borderline prolonged PR interval Abnormal R-wave progression, early transition Probable anteroseptal infarct, old No significant change was found Confirmed by Ezequiel Essex [id number]) on 01/03/2017 7:04:25 AM       Radiology Dg Chest 2 View  Result Date: 01/03/2017 CLINICAL DATA:  Chest pain. EXAM: CHEST  2 VIEW COMPARISON:  09/08/2016 FINDINGS: Vague densities at the left lung base may be related to overlying soft tissue and similar to the previous examination. Lungs are clear. Retrocardiac density is suggestive for a hiatal hernia. Heart size normal. Multilevel degenerative disease in the mid and lower thoracic spine. No pleural effusions. IMPRESSION: No active cardiopulmonary disease. Hiatal hernia. Electronically Signed   By: Markus Daft M.D.   On: 01/03/2017 07:37    Procedures Procedures (including critical care time)  Medications Ordered in ED Medications  fluorescein ophthalmic strip 1 strip (1 strip Both Eyes Given 01/03/17 0556)  tetracaine (PONTOCAINE) 0.5 % ophthalmic solution 2 drop (2 drops Both Eyes Given 01/03/17 0556)  lisinopril  (PRINIVIL,ZESTRIL) tablet 40 mg (40 mg Oral Given 01/03/17 0620)  cloNIDine (CATAPRES) tablet 0.1 mg (0.1 mg Oral Given 01/03/17 0620)  hydrochlorothiazide (HYDRODIURIL) tablet 25 mg (25 mg Oral Given 01/03/17 6160)     Initial Impression / Assessment and Plan / ED Course  I have reviewed the triage vital signs and the nursing notes.  Pertinent labs & imaging results that were available during my care of the patient were reviewed by me and considered in my medical decision making (see chart for details).  Clinical Course as of Jan 04 911  Mon Jan 03, 2017  0822 Dr. Wyvonnia Dusky went to eval pt at 0700 and pt complained of chest "cramps". Only lasted for a few seconds then self resolved. Pt denies any pain at this time. CP workup was started. Mildly elevated trop at .03. Pt was informed of findings and encouarged to be admitted. He states he does not want to be admitted to hospital. And is adamant about not staying. We'll repeat troponin.  [KL]  [id number] Repeat troponin was normal. However given her first elevated troponin and high blood pressure felt that patient would benefit from admission. Patient refuses to stay. Have encouraged him to be admitted and patient refuses. Dr. Wyvonnia Dusky would like the patient to stay. We'll sign patient out AMA and Dr. Wyvonnia Dusky agrees.   [KL]    Clinical Course User Index [KL] Doristine Devoid, PA-C    Patient presents to the emergency department today for elevated blood pressure as patient has been out of his medication for [redacted] weeks along with bilateral eye discharge, erythema, pain.   Concerning his eye complaints. Exam seems consistent with bacterial conjunctivitis. Ocular pressures are normal. He does have a corneal abrasion of the left eye. Vision is equal  bilaterally. Patient does not wear contacts. Tetanus up-to-date. Presentation is not consistent with acute glaucoma, temporal arteritis, uveitis, hyphema, optic neuritis. Patient started on ophthalmic antibiotics.  There is mild erythema of the surrounding orbit of the left eye. Will start patient on oral antibiotics to cover for periorbital cellulitis. Patient is followed by ophthalmology. Seen by my attending who agrees.  Initially patient denied any associated symptoms with the elevated blood pressure. However was evaluated by attending and patient developed short-lived episode of "chest cramps". Chest pain workup was initiated. Initial troponin was mildly elevated at 0.03. Repeat troponin was normal. Blood pressure was elevated. He was given his home medications and blood pressure has gradually improved. The patient denies any associated complaints at this time. Creatinine is normal. Hemoglobin is slightly low but stable from prior. Chest x-ray was normal. EKG is unchanged from prior tracing was reviewed by attending. No signs of ischemia. Patient's symptoms seemed atypical for ACS. Clinical presentation is un concerning for aortic dissection, PE. However given his elevated blood pressure and elevated first troponin encourage patient to be admitted for chest pain rule out and hypertensive emergency. Attending agrees. Patient is adamant about not staying. Attending and myself feel the patient would benefit from admission and patient refuses. We'll sign patient out AMA. Have given prescription for his blood pressure medications and encouraged him to take as prescribed. Encouraged follow-up with PCP.   I have discussed my concerns as a provider and the possibility that this may worsen. We discussed the nature, risks and benefits, and alternatives to treatment. I have specifically discussed that without further evaluation I cannot guarantee there is not a life threatening event occuring.  Time was given to allow the opportunity to ask questions and consider the options, and after the discussion, the patient decided to refuse the offered treatment. Pt is A&Ox4, his own POA and states understanding of my concerns and the  possible consequences. After refusal, I made every reasonable opportunity to treat them to the best of my ability. I have made the patient aware that this is an [facility] discharge, but they may return at any time for further evaluation and treatment.    Final Clinical Impressions(s) / ED Diagnoses   Final diagnoses:  Hypertension, unspecified type  Acute conjunctivitis of both eyes, unspecified acute conjunctivitis type  Abrasion of left cornea, initial encounter    New Prescriptions Current Discharge Medication List    START taking these medications   Details  clindamycin (CLEOCIN) 150 MG capsule Take 3 capsules (450 mg total) by mouth 3 (three) times daily. Qty: 63 capsule, Refills: 0    erythromycin ophthalmic ointment Place a 1/2 inch ribbon of ointment into the lower eyelid four times per day Qty: 1 g, Refills: 0         Aaron Edelman 01/03/17 6294    Ezequiel Essex, MD 01/03/17 [phone]

## 2017-01-03 NOTE — Discharge Instructions (Signed)
Have discussed the risk of leaving vs being admitted to the hospital. Please make sure he have close follow-up with her primary care doctor concerning your chest cramps today. Your eye exam is consistent with a bacterial infection of your eye. Please use the antibiotic ointment as prescribed and take the oral antibiotics as prescribed. Motrin and Tylenol for pain. Return to the ED if you develop worsening chest pain or shortness of breath. Please get her blood pressure medicine filled and take as prescribed. Follow-up with ophthalmologist concerning your eye infection and abrasion of your eye.

## 2017-02-14 ENCOUNTER — Encounter (HOSPITAL_COMMUNITY): Payer: Self-pay | Admitting: Emergency Medicine

## 2017-02-14 ENCOUNTER — Emergency Department (HOSPITAL_COMMUNITY)
Admission: EM | Admit: 2017-02-14 | Discharge: 2017-02-14 | Disposition: A | Discharge status: Home / Self Care | Payer: Medicare Other | Attending: Emergency Medicine | Admitting: Emergency Medicine

## 2017-02-14 ENCOUNTER — Emergency Department (HOSPITAL_COMMUNITY): Payer: Medicare Other

## 2017-02-14 DIAGNOSIS — Z79899 Other long term (current) drug therapy: Secondary | ICD-10-CM | POA: Diagnosis not present

## 2017-02-14 DIAGNOSIS — M545 Low back pain: Secondary | ICD-10-CM | POA: Diagnosis present

## 2017-02-14 DIAGNOSIS — M5432 Sciatica, left side: Secondary | ICD-10-CM | POA: Diagnosis not present

## 2017-02-14 DIAGNOSIS — I10 Essential (primary) hypertension: Secondary | ICD-10-CM | POA: Insufficient documentation

## 2017-02-14 DIAGNOSIS — R1030 Lower abdominal pain, unspecified: Secondary | ICD-10-CM | POA: Insufficient documentation

## 2017-02-14 DIAGNOSIS — F17210 Nicotine dependence, cigarettes, uncomplicated: Secondary | ICD-10-CM | POA: Diagnosis not present

## 2017-02-14 LAB — CBC WITH DIFFERENTIAL/PLATELET
Basophils Absolute: 0 10*3/uL (ref 0.0–0.1)
Basophils Relative: 0 %
Eosinophils Absolute: 0 10*3/uL (ref 0.0–0.7)
Eosinophils Relative: 0 %
HCT: 31.7 % — ABNORMAL LOW (ref 39.0–52.0)
Hemoglobin: 9.4 g/dL — ABNORMAL LOW (ref 13.0–17.0)
Lymphocytes Relative: 14 %
Lymphs Abs: 1.1 10*3/uL (ref 0.7–4.0)
MCH: 21 pg — ABNORMAL LOW (ref 26.0–34.0)
MCHC: 29.7 g/dL — ABNORMAL LOW (ref 30.0–36.0)
MCV: 70.9 fL — ABNORMAL LOW (ref 78.0–100.0)
Monocytes Absolute: 0.5 10*3/uL (ref 0.1–1.0)
Monocytes Relative: 6 %
Neutro Abs: 6.1 10*3/uL (ref 1.7–7.7)
Neutrophils Relative %: 79 %
Platelets: 440 10*3/uL — ABNORMAL HIGH (ref 150–400)
RBC: 4.47 MIL/uL (ref 4.22–5.81)
RDW: 16.2 % — ABNORMAL HIGH (ref 11.5–15.5)
WBC: 7.7 10*3/uL (ref 4.0–10.5)

## 2017-02-14 LAB — I-STAT CHEM 8, ED
BUN: 17 mg/dL (ref 6–20)
Calcium, Ion: 1.11 mmol/L — ABNORMAL LOW (ref 1.15–1.40)
Chloride: 107 mmol/L (ref 101–111)
Creatinine, Ser: 0.7 mg/dL (ref 0.61–1.24)
Glucose, Bld: 109 mg/dL — ABNORMAL HIGH (ref 65–99)
HCT: 34 % — ABNORMAL LOW (ref 39.0–52.0)
Hemoglobin: 11.6 g/dL — ABNORMAL LOW (ref 13.0–17.0)
Potassium: 4.2 mmol/L (ref 3.5–5.1)
Sodium: 138 mmol/L (ref 135–145)
TCO2: 21 mmol/L (ref 0–100)

## 2017-02-14 MED ORDER — PREDNISONE 20 MG PO TABS: 40.0000 mg | ORAL_TABLET | Freq: Every day | ORAL | 0 refills | Status: DC

## 2017-02-14 MED ORDER — DEXAMETHASONE SODIUM PHOSPHATE 10 MG/ML IJ SOLN
10.0000 mg | Freq: Once | INTRAMUSCULAR | Status: AC
  Administered 2017-02-14: 10 mg via INTRAMUSCULAR
  Filled 2017-02-14: qty 1

## 2017-02-14 MED ORDER — IOPAMIDOL (ISOVUE-300) INJECTION 61%
100.0000 mL | Freq: Once | INTRAVENOUS | Status: AC | PRN
  Administered 2017-02-14: 100 mL via INTRAVENOUS

## 2017-02-14 MED ORDER — IOPAMIDOL (ISOVUE-300) INJECTION 61%
INTRAVENOUS | Status: AC
  Administered 2017-02-14: 100 mL via INTRAVENOUS
  Filled 2017-02-14: qty 100

## 2017-02-14 MED ORDER — HYDROCHLOROTHIAZIDE 25 MG PO TABS
25.0000 mg | ORAL_TABLET | Freq: Every day | ORAL | Status: DC
  Administered 2017-02-14: 25 mg via ORAL
  Filled 2017-02-14: qty 1

## 2017-02-14 MED ORDER — CYCLOBENZAPRINE HCL 10 MG PO TABS: 10.0000 mg | ORAL_TABLET | Freq: Two times a day (BID) | ORAL | 0 refills | Status: DC | PRN

## 2017-02-14 MED ORDER — CLONIDINE HCL 0.1 MG PO TABS
0.1000 mg | ORAL_TABLET | Freq: Every day | ORAL | Status: DC
  Administered 2017-02-14: 0.1 mg via ORAL
  Filled 2017-02-14: qty 1

## 2017-02-14 MED ORDER — HYDROMORPHONE HCL 1 MG/ML IJ SOLN
1.0000 mg | Freq: Once | INTRAMUSCULAR | Status: AC
  Administered 2017-02-14: 1 mg via INTRAMUSCULAR
  Filled 2017-02-14: qty 1

## 2017-02-14 MED ORDER — DIAZEPAM 5 MG/ML IJ SOLN
5.0000 mg | Freq: Once | INTRAMUSCULAR | Status: AC
  Administered 2017-02-14: 5 mg via INTRAVENOUS
  Filled 2017-02-14: qty 2

## 2017-02-14 MED ORDER — LISINOPRIL 20 MG PO TABS
40.0000 mg | ORAL_TABLET | Freq: Every day | ORAL | Status: DC
  Administered 2017-02-14: 40 mg via ORAL
  Filled 2017-02-14: qty 2

## 2017-02-14 NOTE — ED Triage Notes (Signed)
Pt reports back pain that radiates down left leg that started yesterday. Pt denies any injury.

## 2017-02-14 NOTE — ED Notes (Signed)
Pt states he want to leave and take a bus home informed pt with all the sedating medication he has had he would be unable to take the bus home.

## 2017-02-14 NOTE — ED Notes (Signed)
Patient transported to CT 

## 2017-02-14 NOTE — ED Provider Notes (Signed)
[facility] DEPT Provider Note   CSN: [id number] Arrival date & time: 02/14/17  0256     History   Chief Complaint Chief Complaint  Patient presents with  . Back Pain    HPI [name] is a 66 y.o. male.  Patient presents to the emergency department with chief complaint of low back pain. He states the symptoms started acutely yesterday. He states the pain radiates down his left leg and buttock. He also reports some pain that radiates into his left groin. Patient denies any heavy lifting or twisting. Denies any known injury. He does have chronic back pain. He denies any fevers, chills, nausea, or vomiting.  Denies any bowel or bladder problems.  Denies any numbness, weakness, or tingling.  He walks with a cane at baseline.  Additionally, patient states that he has some pain in his abdomen beneath his belly button.  This started about the same time.   The history is provided by the patient. No language interpreter was used.    Past Medical History:  Diagnosis Date  . Arthritis   . Chronic back pain   . GERD (gastroesophageal reflux disease)    uses Baking Soda  . GIB (gastrointestinal bleeding) 2012  . Glaucoma    right eye  . Headache    occasionally  . Hepatitis C   . History of blood transfusion    no abnormal reaction noted  . History of gout    doesn't take any meds  . Hyperlipidemia    not on any meds  . Hypertension    takes Amlodipine and Lisinopril daily  . Insomnia    takes Ambien nightly  . Ischemic colitis ([place]) 2012  . Joint pain   . Nocturia   . Numbness    both legs occasionally  . PONV (postoperative nausea and vomiting)   . Prostate cancer ([place])   . Shortness of breath dyspnea    rarely but when notices he can be lying/sitting/exertion.Dr.Hochrein is aware per pt  . Stroke ([place]) 2016   . Urinary frequency   . Urinary urgency     Patient Active Problem List   Diagnosis Date Noted  . Anemia, iron deficiency   . Gastrointestinal  bleeding 10/24/2015  . Absolute anemia   . Tobacco abuse   . Acute ischemic stroke ([place])   . Acute CVA (cerebrovascular accident) ([place]) 05/31/2015  . Stroke ([place]) 05/31/2015  . Dyspnea 04/10/2014  . HTN (hypertension) 04/10/2014  . Inguinal hernia without mention of obstruction or gangrene, unilateral or unspecified, (not specified as recurrent)-right 01/30/2014    Past Surgical History:  Procedure Laterality Date  . APPENDECTOMY    . COLONOSCOPY    . COLONOSCOPY N/A 10/26/2015   Procedure: COLONOSCOPY;  Surgeon: Doran Stabler, MD;  Location: [facility] ENDOSCOPY;  Service: Endoscopy;  Laterality: N/A;  . ESOPHAGOGASTRODUODENOSCOPY    . ESOPHAGOGASTRODUODENOSCOPY N/A 10/26/2015   Procedure: ESOPHAGOGASTRODUODENOSCOPY (EGD);  Surgeon: Doran Stabler, MD;  Location: [facility] ENDOSCOPY;  Service: Endoscopy;  Laterality: N/A;  . INGUINAL HERNIA REPAIR Right 11/04/2014   Procedure: RIGHT INGUINAL HERNIA REPAIR WITH MESH;  Surgeon: Jackolyn Confer, MD;  Location: [facility];  Service: General;  Laterality: Right;  . MASS EXCISION N/A 11/04/2014   Procedure: REMOVAL OF RIGHT GROIN SOFT TISSUE MASS;  Surgeon: Jackolyn Confer, MD;  Location: [facility];  Service: General;  Laterality: N/A;  . Multiple abdominal surgeries     total of 13  . PROSTATECTOMY    .  SMALL INTESTINE SURGERY         Home Medications    Prior to Admission medications   Medication Sig Start Date End Date Taking? Authorizing Provider  buPROPion (WELLBUTRIN XL) 300 MG 24 hr tablet Take 1 tablet (300 mg total) by mouth daily. Patient not taking: Reported on 09/10/2016 01/21/16   Dorothyann Peng, NP  clindamycin (CLEOCIN) 150 MG capsule Take 3 capsules (450 mg total) by mouth 3 (three) times daily. 01/03/17   Doristine Devoid, PA-C  cloNIDine (CATAPRES) 0.1 MG tablet Take 1 tablet (0.1 mg total) by mouth daily. 01/03/17   Doristine Devoid, PA-C  dexlansoprazole (DEXILANT) 60 MG capsule Take 60 mg by mouth daily.    [provider]   diclofenac sodium (VOLTAREN) 1 % GEL Apply 2 g topically 3 (three) times daily as needed (Severe knee pain). Patient not taking: Reported on 09/10/2016 06/04/15   Geradine Girt, DO  erythromycin ophthalmic ointment Place a 1/2 inch ribbon of ointment into the lower eyelid four times per day 01/03/17   Ocie Cornfield T, PA-C  ferrous sulfate 325 (65 FE) MG tablet Take 1 tablet (325 mg total) by mouth daily with breakfast. Patient not taking: Reported on 09/10/2016 10/26/15   Delfina Redwood, MD  Glecaprevir-Pibrentasvir (MAVYRET) 100-40 MG TABS Take 3 tablets by mouth daily with breakfast. 09/24/16   Carlyle Basques, MD  hydrochlorothiazide (HYDRODIURIL) 25 MG tablet Take 1 tablet (25 mg total) by mouth daily. 01/03/17   Doristine Devoid, PA-C  HYDROcodone-acetaminophen (NORCO/VICODIN) 5-325 MG tablet Take 1 tablet by mouth every 6 (six) hours as needed for severe pain. Patient not taking: Reported on 09/10/2016 06/04/15   Geradine Girt, DO  isosorbide mononitrate (IMDUR) 60 MG 24 hr tablet Take 1 tablet (60 mg total) by mouth daily. 05/27/16   Carlyle Basques, MD  lisinopril (PRINIVIL,ZESTRIL) 40 MG tablet Take 1 tablet (40 mg total) by mouth daily. 01/03/17   Doristine Devoid, PA-C  LORazepam (ATIVAN) 0.5 MG tablet Take 1 tablet (0.5 mg total) by mouth every 8 (eight) hours as needed for anxiety. Patient not taking: Reported on 09/10/2016 06/04/15   Geradine Girt, DO  nicotine (NICODERM CQ - DOSED IN MG/24 HOURS) 14 mg/24hr patch Place 1 patch (14 mg total) onto the skin daily. Patient not taking: Reported on 09/10/2016 06/04/15   Geradine Girt, DO  predniSONE (DELTASONE) 10 MG tablet Take 4 tablets by mouth for 2 days, 3 tablets by mouth for 2 days, 2 tablets by mouth for 2 days, and one tablet by mouth for 2 days. Patient not taking: Reported on 09/10/2016 12/08/15   Delano Metz, FNP  pregabalin (LYRICA) 150 MG capsule Take 150 mg by mouth 2 (two) times daily.    [provider]  senna-docusate (SENOKOT-S) 8.6-50 MG tablet Take 1 tablet by mouth at bedtime as needed for moderate constipation. Patient not taking: Reported on 09/10/2016 06/04/15   Geradine Girt, DO  solifenacin (VESICARE) 5 MG tablet Take 5 mg by mouth at bedtime.     [provider]  Suvorexant (BELSOMRA) 15 MG TABS Take 1 tablet by mouth at bedtime. Patient not taking: Reported on 09/10/2016 02/24/16   Dorothyann Peng, NP  zolpidem (AMBIEN) 10 MG tablet Take 1 tablet (10 mg total) by mouth at bedtime. Patient not taking: Reported on 09/10/2016 01/21/16   Dorothyann Peng, NP    Family History Family History  Problem Relation Age of Onset  . Alzheimer's disease  Father   . Cancer Mother        Lymph node  . Heart failure Brother 45       Transplant 13 years ago  . CAD Brother   . Heart disease Sister 15       MI    Social History Social History  Substance Use Topics  . Smoking status: Current Every Day Smoker    Packs/day: 0.50    Years: 50.00    Types: Cigarettes  . Smokeless tobacco: Never Used  . Alcohol use 0.0 oz/week     Comment: occasionally     Allergies   Penicillins; Sudafed [pseudoephedrine]; Trazodone and nefazodone; and Tomato   Review of Systems Review of Systems  All other systems reviewed and are negative.    Physical Exam Updated Vital Signs BP (!) 240/100 (BP Location: Left Arm) Comment: Rob, PA notified  Pulse 67   Temp 97.7 F (36.5 C) (Oral)   Resp 16   Ht 5\' 9"  (1.753 m)   Wt 98.4 kg (217 lb)   SpO2 100%   BMI 32.05 kg/m   Physical Exam  Constitutional: He is oriented to person, place, and time. He appears well-developed and well-nourished.  HENT:  Head: Normocephalic and atraumatic.  Eyes: Pupils are equal, round, and reactive to light. Conjunctivae and EOM are normal. Right eye exhibits no discharge. Left eye exhibits no discharge. No scleral icterus.  Neck: Normal range of motion. Neck supple. No JVD present.    Cardiovascular: Normal rate, regular rhythm and normal heart sounds.  Exam reveals no gallop and no friction rub.   No murmur heard. Pulmonary/Chest: Effort normal and breath sounds normal. No respiratory distress. He has no wheezes. He has no rales. He exhibits no tenderness.  Abdominal: Soft. He exhibits no distension and no mass. There is no tenderness. There is no rebound and no guarding.  Musculoskeletal: Normal range of motion. He exhibits no edema or tenderness.  Neurological: He is alert and oriented to person, place, and time.  Skin: Skin is warm and dry.  Psychiatric: He has a normal mood and affect. His behavior is normal. Judgment and thought content normal.  Nursing note and vitals reviewed.    ED Treatments / Results  Labs (all labs ordered are listed, but only abnormal results are displayed) Labs Reviewed  CBC WITH DIFFERENTIAL/PLATELET  I-STAT CHEM 8, ED    EKG  EKG Interpretation None       Radiology No results found.  Procedures Procedures (including critical care time)  Medications Ordered in ED Medications  HYDROmorphone (DILAUDID) injection 1 mg (not administered)     Initial Impression / Assessment and Plan / ED Course  I have reviewed the triage vital signs and the nursing notes.  Pertinent labs & imaging results that were available during my care of the patient were reviewed by me and considered in my medical decision making (see chart for details).     Patient presents with left sided low back pain. He states that the pain started yesterday. It radiates into his left buttock. He denies any injury. He states that he has had a small amount of abdominal pain, and is uncertain what this is radiating from the left side or something different. He denies having taken his blood pressure medicine today, and in reviewing prior records is noted to have poor BP control.  He denies any headache, dizziness, chest pain, or shortness of breath. Nevertheless,  will give him his regular home meds  for his asymptomatic hypertension.  Patient will need to follow-up with his PCP regarding his BP.    5:54 AM Patient states that his pain comes in spasms, and feels like he has a catch in his back.  CT was ordered to rule out more severe pathology such as dissection, retroperitoneal hematoma, or KS.  CT shows no evidence of acute process.     Patient given home meds.  Seen by and discussed with Dr. Tomi Bamberger, who agrees with plan for discharge and recommends giving IM decadron and stress dose of prednisone to help with radicular pain.  Final Clinical Impressions(s) / ED Diagnoses   Final diagnoses:  Sciatica of left side    New Prescriptions New Prescriptions   CYCLOBENZAPRINE (FLEXERIL) 10 MG TABLET    Take 1 tablet (10 mg total) by mouth 2 (two) times daily as needed for muscle spasms.   PREDNISONE (DELTASONE) 20 MG TABLET    Take 2 tablets (40 mg total) by mouth daily.     Montine Circle, PA-C 02/14/17 [id number]    Rolland Porter, MD 02/14/17 [phone]

## 2017-02-14 NOTE — ED Notes (Signed)
Bed: UO15 Expected date:  Expected time:  Means of arrival:  Comments: Room 23

## 2017-02-14 NOTE — ED Provider Notes (Signed)
Patient reports acute onset of left sided low back pain that radiates down into his left foot that started yesterday. He denies any change in activity or injury. He states he's had low back pain before but nothing like this.  Patient appears comfortable specially when he tries to change positions. He holds his hand over his left SI joint as where the pain starts. There are no scars on his back to indicate prior lumbar surgery. He has good range of motion of his lower extremities. However he states it causes pain.  Medical screening examination/treatment/procedure(s) were conducted as a shared visit with non-physician practitioner(s) and myself.  I personally evaluated the patient during the encounter.   EKG Interpretation None       Rolland Porter, MD, Barbette Or, MD 02/14/17 [phone]

## 2017-02-23 ENCOUNTER — Ambulatory Visit (INDEPENDENT_AMBULATORY_CARE_PROVIDER_SITE_OTHER): Payer: Medicare Other | Admitting: Pharmacist

## 2017-02-23 DIAGNOSIS — B182 Chronic viral hepatitis C: Secondary | ICD-10-CM

## 2017-02-23 NOTE — Progress Notes (Signed)
HPI: [name] is a 66 y.o. male who presents to the [facility] clinic for his Hep C cure visit.  He has genotype 1b, F3 fibrosis, and completed 8 weeks of Mavyret in May.   No results found for: HCVGENOTYPE, HEPCGENOTYPE  Allergies: Allergies  Allergen Reactions  . Penicillins Anaphylaxis    Has patient had a PCN reaction causing immediate rash, facial/tongue/throat swelling, SOB or lightheadedness with hypotension: Yes Has patient had a PCN reaction causing severe rash involving mucus membranes or skin necrosis: Unk Has patient had a PCN reaction that required hospitalization: Unk Has patient had a PCN reaction occurring within the last 10 years: No If all of the above answers are "NO", then may proceed with Cephalosporin use..  . Sudafed [Pseudoephedrine] Other (See Comments)    Heart "races"  . Trazodone And Nefazodone Swelling  . Tomato Rash and Other (See Comments)    Indigestion (also)    Past Medical History: Past Medical History:  Diagnosis Date  . Arthritis   . Chronic back pain   . GERD (gastroesophageal reflux disease)    uses Baking Soda  . GIB (gastrointestinal bleeding) 2012  . Glaucoma    right eye  . Headache    occasionally  . Hepatitis C   . History of blood transfusion    no abnormal reaction noted  . History of gout    doesn't take any meds  . Hyperlipidemia    not on any meds  . Hypertension    takes Amlodipine and Lisinopril daily  . Insomnia    takes Ambien nightly  . Ischemic colitis ([place]) 2012  . Joint pain   . Nocturia   . Numbness    both legs occasionally  . PONV (postoperative nausea and vomiting)   . Prostate cancer ([place])   . Shortness of breath dyspnea    rarely but when notices he can be lying/sitting/exertion.Dr.Hochrein is aware per pt  . Stroke ([place]) 2016   . Urinary frequency   . Urinary urgency     Social History: Social History   Social History  . Marital status: Legally Separated    Spouse name: N/A  .  Number of children: 2  . Years of education: N/A   Social History Main Topics  . Smoking status: Current Every Day Smoker    Packs/day: 0.50    Years: 50.00    Types: Cigarettes  . Smokeless tobacco: Never Used  . Alcohol use 0.0 oz/week     Comment: occasionally  . Drug use: No  . Sexual activity: Yes   Other Topics Concern  . Not on file   Social History Narrative   One living child .  One murdered.  Lives with girlfriend.      Labs: Hep B S Ab (no units)  Date Value  05/12/2016 NEG   Hepatitis B Surface Ag (no units)  Date Value  05/12/2016 NEGATIVE    No results found for: HCVGENOTYPE, HEPCGENOTYPE  Hepatitis C RNA quantitative Latest Ref Rng & Units 11/23/2016 10/25/2016 10/25/2015  HCV Quantitative NOT DETECTED IU/mL <15 NOT DETECTED <15 NOT DETECTED 442,000  HCV Quantitative Log NOT DETECTED Log IU/mL <1.18 NOT DETECTED <1.18 NOT DETECTED 5.645    AST (U/L)  Date Value  02/13/2016 19  10/26/2015 32  10/24/2015 19   ALT (U/L)  Date Value  05/12/2016 16  02/13/2016 18  10/26/2015 29  10/24/2015 18   INR (no units)  Date Value  05/12/2016 1.2 (H)  10/26/2015 1.37  05/30/2015 1.34    CrCl: Estimated Creatinine Clearance: 103.7 mL/min (by C-G formula based on SCr of 0.7 mg/dL).  Fibrosis Score: F3 as assessed by fibrosure   Child-Pugh Score: A  Previous Treatment Regimen: None  Assessment: [name] is here today for his Hep C cure visit. He completed all 8 weeks of Mavyret back in May with no issues.  His early on treatment and end of therapy Hep C viral loads were both undetectable.  Counseled him on the risk of reinfection even if he is cured.  I will get a final Hep C viral load today and call him with the results in a few days when they are back. He will also come back in October for his 3rd Hep B shot.   Plans: - Hep C treatment complete - Hep C viral load today  [name] L. Ravis Herne, PharmD, [facility] 02/23/2017, 11:45 AM

## 2017-02-26 LAB — HEPATITIS C RNA QUANTITATIVE
HCV Quantitative Log: <1.18 Log IU/mL
HCV Quantitative: <15 IU/mL

## 2017-02-28 ENCOUNTER — Other Ambulatory Visit: Payer: Self-pay | Admitting: Pharmacist

## 2017-02-28 ENCOUNTER — Telehealth: Payer: Self-pay | Admitting: Pharmacist

## 2017-02-28 NOTE — Telephone Encounter (Signed)
Called [name] to let him know that he is cured of his Hep C.

## 2017-04-25 ENCOUNTER — Ambulatory Visit (INDEPENDENT_AMBULATORY_CARE_PROVIDER_SITE_OTHER): Payer: Medicare Other | Admitting: Pharmacist

## 2017-04-25 DIAGNOSIS — B182 Chronic viral hepatitis C: Secondary | ICD-10-CM

## 2017-04-25 DIAGNOSIS — Z23 Encounter for immunization: Secondary | ICD-10-CM

## 2017-04-25 NOTE — Progress Notes (Signed)
HPI: [name] is a 66 y.o. male who presents to the [facility] clinic for his 3rd Hep B vaccine.  No results found for: HCVGENOTYPE, HEPCGENOTYPE  Allergies: Allergies  Allergen Reactions  . Penicillins Anaphylaxis    Has patient had a PCN reaction causing immediate rash, facial/tongue/throat swelling, SOB or lightheadedness with hypotension: Yes Has patient had a PCN reaction causing severe rash involving mucus membranes or skin necrosis: Unk Has patient had a PCN reaction that required hospitalization: Unk Has patient had a PCN reaction occurring within the last 10 years: No If all of the above answers are "NO", then may proceed with Cephalosporin use..  . Sudafed [Pseudoephedrine] Other (See Comments)    Heart "races"  . Trazodone And Nefazodone Swelling  . Tomato Rash and Other (See Comments)    Indigestion (also)    Past Medical History: Past Medical History:  Diagnosis Date  . Arthritis   . Chronic back pain   . GERD (gastroesophageal reflux disease)    uses Baking Soda  . GIB (gastrointestinal bleeding) 2012  . Glaucoma    right eye  . Headache    occasionally  . Hepatitis C   . History of blood transfusion    no abnormal reaction noted  . History of gout    doesn't take any meds  . Hyperlipidemia    not on any meds  . Hypertension    takes Amlodipine and Lisinopril daily  . Insomnia    takes Ambien nightly  . Ischemic colitis ([place]) 2012  . Joint pain   . Nocturia   . Numbness    both legs occasionally  . PONV (postoperative nausea and vomiting)   . Prostate cancer ([place])   . Shortness of breath dyspnea    rarely but when notices he can be lying/sitting/exertion.Dr.Hochrein is aware per pt  . Stroke ([place]) 2016   . Urinary frequency   . Urinary urgency     Social History: Social History   Social History  . Marital status: Legally Separated    Spouse name: N/A  . Number of children: 2  . Years of education: N/A   Social History Main  Topics  . Smoking status: Current Every Day Smoker    Packs/day: 0.50    Years: 50.00    Types: Cigarettes  . Smokeless tobacco: Never Used  . Alcohol use 0.0 oz/week     Comment: occasionally  . Drug use: No  . Sexual activity: Yes   Other Topics Concern  . Not on file   Social History Narrative   One living child .  One murdered.  Lives with girlfriend.      Labs: Hep B S Ab (no units)  Date Value  05/12/2016 NEG   Hepatitis B Surface Ag (no units)  Date Value  05/12/2016 NEGATIVE    No results found for: HCVGENOTYPE, HEPCGENOTYPE  Hepatitis C RNA quantitative Latest Ref Rng & Units 02/23/2017 11/23/2016 10/25/2016 10/25/2015  HCV Quantitative NOT DETECTED IU/mL <15 NOT DETECTED <15 NOT DETECTED <15 NOT DETECTED 442,000  HCV Quantitative Log NOT DETECTED Log IU/mL <1.18 NOT DETECTED <1.18 NOT DETECTED <1.18 NOT DETECTED 5.645    AST (U/L)  Date Value  02/13/2016 19  10/26/2015 32  10/24/2015 19   ALT (U/L)  Date Value  05/12/2016 16  02/13/2016 18  10/26/2015 29  10/24/2015 18   INR (no units)  Date Value  05/12/2016 1.2 (H)  10/26/2015 1.37  05/30/2015 1.34  CrCl: CrCl cannot be calculated (Patient's most recent lab result is older than the maximum 21 days allowed.).    Assessment: [name] has completed his treatment for Hepatitis C and is now cured. He came in today to get his 3rd and final Hep B vaccine. I also gave him his flu shot.    Plans: - Cured of Hep C - Final Hep B vaccine today - Flu vaccine today - RTC PRN  Cassie L. Kuppelweiser, PharmD, [facility] for [facility] 04/25/2017, 1:57 PM

## 2017-06-23 ENCOUNTER — Ambulatory Visit (HOSPITAL_COMMUNITY)
Admission: RE | Admit: 2017-06-23 | Discharge: 2017-06-23 | Disposition: A | Discharge status: Home / Self Care | Payer: Medicare Other | Source: Ambulatory Visit | Attending: Family Medicine | Admitting: Family Medicine

## 2017-06-23 ENCOUNTER — Telehealth: Payer: Self-pay | Admitting: Family Medicine

## 2017-06-23 ENCOUNTER — Other Ambulatory Visit: Payer: Self-pay

## 2017-06-23 ENCOUNTER — Ambulatory Visit (INDEPENDENT_AMBULATORY_CARE_PROVIDER_SITE_OTHER): Payer: Medicare Other | Admitting: Family Medicine

## 2017-06-23 ENCOUNTER — Encounter: Payer: Self-pay | Admitting: Family Medicine

## 2017-06-23 VITALS — BP 195/98 | HR 79 | Resp 16 | Ht 69.0 in | Wt 209.0 lb

## 2017-06-23 DIAGNOSIS — Q280 Arteriovenous malformation of precerebral vessels: Secondary | ICD-10-CM | POA: Insufficient documentation

## 2017-06-23 DIAGNOSIS — R0683 Snoring: Secondary | ICD-10-CM

## 2017-06-23 DIAGNOSIS — G4700 Insomnia, unspecified: Secondary | ICD-10-CM

## 2017-06-23 DIAGNOSIS — I10 Essential (primary) hypertension: Secondary | ICD-10-CM | POA: Diagnosis not present

## 2017-06-23 DIAGNOSIS — I6930 Unspecified sequelae of cerebral infarction: Secondary | ICD-10-CM

## 2017-06-23 DIAGNOSIS — Z9114 Patient's other noncompliance with medication regimen: Secondary | ICD-10-CM

## 2017-06-23 DIAGNOSIS — R221 Localized swelling, mass and lump, neck: Secondary | ICD-10-CM

## 2017-06-23 MED ORDER — LISINOPRIL 40 MG PO TABS: 40.0000 mg | ORAL_TABLET | Freq: Every day | ORAL | 1 refills | Status: DC

## 2017-06-23 MED ORDER — HYDROXYZINE HCL 10 MG PO TABS: 10.0000 mg | ORAL_TABLET | Freq: Every evening | ORAL | 1 refills | Status: DC | PRN

## 2017-06-23 MED ORDER — HYDROCHLOROTHIAZIDE 12.5 MG PO CAPS: 12.5000 mg | ORAL_CAPSULE | Freq: Every day | ORAL | 1 refills | Status: DC

## 2017-06-23 NOTE — Telephone Encounter (Signed)
Copied from [place] [id number]. Topic: Quick Communication - See Telephone Encounter >> Jun 23, 2017  4:42 PM Cleaster Corin, NT wrote: CRM for notification. See Telephone encounter for:   06/23/17. Pt. Calling once pt had got home he realized he didn't have his medications. Pt. Is missing lisinopril and hydroxyzine pt. Is seeing if doctor Carlota Raspberry can write another rx. For him

## 2017-06-23 NOTE — Patient Instructions (Addendum)
You are scheduled for an Ultrasound at [facility] today. Please arrive at the main entrance to the hospital and you will be guided to the radiology department.   Restart lisinopril, and start hydrochlorothiazide for blood pressure.  Keep a record of your blood pressures outside of the office and bring them to the next office visit. If any lightheadedness or low readings, stop hydrochlorothiazide and follow up to discuss..  Either way follow up in next 2 weeks for blood pressure.   I would like you to meet with sleep specialist to discuss your insomnia. See information below on insomnia. You can try hydroxyzine at bedtime (no more than 2 pills) for now - stop that medicine if that causes any dizziness or lightheadedness.   I will refer you for ultrasound of your neck arteries and the pulsatile areas on your exam.   Tylenol for upper and lower back pain may be safest for now. Heat or ice as needed. Return in next 2 weeks to discuss those areas further and possible xrays.  Once blood pressure is controlled, we have other medication options.  Return to the clinic or go to the nearest emergency room if any of your symptoms worsen or new symptoms occur.   Insomnia Insomnia is a sleep disorder that makes it difficult to fall asleep or to stay asleep. Insomnia can cause tiredness (fatigue), low energy, difficulty concentrating, mood swings, and poor performance at work or school. There are three different ways to classify insomnia:  Difficulty falling asleep.  Difficulty staying asleep.  Waking up too early in the morning.  Any type of insomnia can be long-term (chronic) or short-term (acute). Both are common. Short-term insomnia usually lasts for three months or less. Chronic insomnia occurs at least three times a week for longer than three months. What are the causes? Insomnia may be caused by another condition, situation, or substance, such as:  Anxiety.  Certain  medicines.  Gastroesophageal reflux disease (GERD) or other gastrointestinal conditions.  Asthma or other breathing conditions.  Restless legs syndrome, sleep apnea, or other sleep disorders.  Chronic pain.  Menopause. This may include hot flashes.  Stroke.  Abuse of alcohol, tobacco, or illegal drugs.  Depression.  Caffeine.  Neurological disorders, such as Alzheimer disease.  An overactive thyroid (hyperthyroidism).  The cause of insomnia may not be known. What increases the risk? Risk factors for insomnia include:  Gender. Women are more commonly affected than men.  Age. Insomnia is more common as you get older.  Stress. This may involve your professional or personal life.  Income. Insomnia is more common in people with lower income.  Lack of exercise.  Irregular work schedule or night shifts.  Traveling between different time zones.  What are the signs or symptoms? If you have insomnia, trouble falling asleep or trouble staying asleep is the main symptom. This may lead to other symptoms, such as:  Feeling fatigued.  Feeling nervous about going to sleep.  Not feeling rested in the morning.  Having trouble concentrating.  Feeling irritable, anxious, or depressed.  How is this treated? Treatment for insomnia depends on the cause. If your insomnia is caused by an underlying condition, treatment will focus on addressing the condition. Treatment may also include:  Medicines to help you sleep.  Counseling or therapy.  Lifestyle adjustments.  Follow these instructions at home:  Take medicines only as directed by your health care provider.  Keep regular sleeping and waking hours. Avoid naps.  Keep a  sleep diary to help you and your health care provider figure out what could be causing your insomnia. Include: ? When you sleep. ? When you wake up during the night. ? How well you sleep. ? How rested you feel the next day. ? Any side effects of  medicines you are taking. ? What you eat and drink.  Make your bedroom a comfortable place where it is easy to fall asleep: ? Put up shades or special blackout curtains to block light from outside. ? Use a white noise machine to block noise. ? Keep the temperature cool.  Exercise regularly as directed by your health care provider. Avoid exercising right before bedtime.  Use relaxation techniques to manage stress. Ask your health care provider to suggest some techniques that may work well for you. These may include: ? Breathing exercises. ? Routines to release muscle tension. ? Visualizing peaceful scenes.  Cut back on alcohol, caffeinated beverages, and cigarettes, especially close to bedtime. These can disrupt your sleep.  Do not overeat or eat spicy foods right before bedtime. This can lead to digestive discomfort that can make it hard for you to sleep.  Limit screen use before bedtime. This includes: ? Watching TV. ? Using your smartphone, tablet, and computer.  Stick to a routine. This can help you fall asleep faster. Try to do a quiet activity, brush your teeth, and go to bed at the same time each night.  Get out of bed if you are still awake after 15 minutes of trying to sleep. Keep the lights down, but try reading or doing a quiet activity. When you feel sleepy, go back to bed.  Make sure that you drive carefully. Avoid driving if you feel very sleepy.  Keep all follow-up appointments as directed by your health care provider. This is important. Contact a health care provider if:  You are tired throughout the day or have trouble in your daily routine due to sleepiness.  You continue to have sleep problems or your sleep problems get worse. Get help right away if:  You have serious thoughts about hurting yourself or someone else. This information is not intended to replace advice given to you by your health care provider. Make sure you discuss any questions you have with  your health care provider. Document Released: 07/02/2000 Document Revised: 12/05/2015 Document Reviewed: 04/05/2014 Elsevier Interactive Patient Education  2018 [employer] offers smoking cessation clinics. Registration is required. To register call [phone] or register online at [email or web address]  Steps to Quit Smoking Smoking tobacco can be bad for your health. It can also affect almost every organ in your body. Smoking puts you and people around you at risk for many serious long-lasting (chronic) diseases. Quitting smoking is hard, but it is one of the best things that you can do for your health. It is never too late to quit. What are the benefits of quitting smoking? When you quit smoking, you lower your risk for getting serious diseases and conditions. They can include:  Lung cancer or lung disease.  Heart disease.  Stroke.  Heart attack.  Not being able to have children (infertility).  Weak bones (osteoporosis) and broken bones (fractures).  If you have coughing, wheezing, and shortness of breath, those symptoms may get better when you quit. You may also get sick less often. If you are pregnant, quitting smoking can help to lower your chances of having a baby of low birth weight. What can I do to help  me quit smoking? Talk with your doctor about what can help you quit smoking. Some things you can do (strategies) include:  Quitting smoking totally, instead of slowly cutting back how much you smoke over a period of time.  Going to in-person counseling. You are more likely to quit if you go to many counseling sessions.  Using resources and support systems, such as: ? Database administrator with a Social worker. ? Phone quitlines. ? Careers information officer. ? Support groups or group counseling. ? Text messaging programs. ? Mobile phone apps or applications.  Taking medicines. Some of these medicines may have nicotine in them. If you are pregnant or breastfeeding, do  not take any medicines to quit smoking unless your doctor says it is okay. Talk with your doctor about counseling or other things that can help you.  Talk with your doctor about using more than one strategy at the same time, such as taking medicines while you are also going to in-person counseling. This can help make quitting easier. What things can I do to make it easier to quit? Quitting smoking might feel very hard at first, but there is a lot that you can do to make it easier. Take these steps:  Talk to your family and friends. Ask them to support and encourage you.  Call phone quitlines, reach out to support groups, or work with a Social worker.  Ask people who smoke to not smoke around you.  Avoid places that make you want (trigger) to smoke, such as: ? Bars. ? Parties. ? Smoke-break areas at work.  Spend time with people who do not smoke.  Lower the stress in your life. Stress can make you want to smoke. Try these things to help your stress: ? Getting regular exercise. ? Deep-breathing exercises. ? Yoga. ? Meditating. ? Doing a body scan. To do this, close your eyes, focus on one area of your body at a time from head to toe, and notice which parts of your body are tense. Try to relax the muscles in those areas.  Download or buy apps on your mobile phone or tablet that can help you stick to your quit plan. There are many free apps, such as QuitGuide from the [employer] Office manager for Disease Control and Prevention). You can find more support from smokefree.gov and other websites.  This information is not intended to replace advice given to you by your health care provider. Make sure you discuss any questions you have with your health care provider. Document Released: 05/01/2009 Document Revised: 03/02/2016 Document Reviewed: 11/19/2014 Elsevier Interactive Patient Education  2018 [employer].    IF you received an x-ray today, you will receive an invoice from [facility]. Please  contact [facility] at [phone] with questions or concerns regarding your invoice.   IF you received labwork today, you will receive an invoice from [facility]. Please contact [facility] at [phone] with questions or concerns regarding your invoice.   Our billing staff will not be able to assist you with questions regarding bills from these companies.  You will be contacted with the lab results as soon as they are available. The fastest way to get your results is to activate your My Chart account. Instructions are located on the last page of this paperwork. If you have not heard from [place] regarding the results in 2 weeks, please contact this office.

## 2017-06-23 NOTE — Telephone Encounter (Signed)
Pt has been scheduled to have Carotid U/S on Monday 06/27/17 at 10am arriving at 9:40 am at [place]. Their phone number is [phone]. I spoke with pt to advise him of the appt date and time. He wanted to write down the address but was unable to at the time he called, so he said he will call back to get this info.

## 2017-06-23 NOTE — Progress Notes (Addendum)
Subjective:  This chart was scribed for Jonathon Agreste, MD by Tamsen Roers, at [place] at [facility].  This patient was seen in room 12 and the patient's care was started at 11:57 AM.   Chief Complaint  Patient presents with  . Establish Care    pt was pain in neck and back     Patient ID: [name], male    DOB: May 06, 1951, 66 y.o.   MRN: [id number]  HPI HPI Comments: Zandyr Barnhill is a 66 y.o. male who presents to Primary Care at [facility] as a new patient to me, previously followed by Dorothyann Peng. History of multiple medical problems including hypertension, stroke, hepatitis C.  It appears he has most recently been followed by infectious disease for hepatitis C. Treated with Mavyret in May with clinical cure noted on August 13 th telephone message. Patient is a smoker and is thinking about quitting.    Hypertension: Elevated today as he has been out of medication for a few weeks. Usually takes Lisinopril 40 mg QD and Clonidine .1 mg QD. He was seen in the ER in June, BP at that time was 176/76. Apparently he had some chest discomfort at that time as well, self resolved within seconds.  He did have a mild elevation of troponin .03.  Repeat troponin was normal, recommended  further evaluation left AMA. Blood pressure at July 30 th ER visit for back pain 240/100.---- Patient states that it " has been a while" since last seeing a PCP.  He last took his blood pressure medication about 3.5 weeks ago.  Denies any new weakness, chest pains, blurry vision or difficulty speaking. He states that he was taking a third medication but is unable to recall the name.  He does not have a blood pressure cuff at home but does go to [employer] occasionally to check it there.    Neck : Patient has had a pulsating sensation on the right side of his neck for the past year.  He also has some intermittent cramping/pain which requires him to stretch his neck backwards.  He denies any history of  surgeries on his neck. Patient had a carotid doppler November 2016, vertebral arteries were patent, right and left internal carotids, 1-39 % stenosis.  Patients brother had a heart transplant due to heart failure. Patient has seen Dr. Einar Gip (cardiologist) in the past but has not seen him recently. He was ordered to have an ultrasound of his abdomen in 2015 but states that he did not get one.    Back pain:  He was seen July 30 th in the ER the for acute onset left lower back pain radiating to his left foot. He was treated with Prednisone and Flexeril after CT scan of the abdomen and pelvis without acute process identified. No recent lumbar imaging. --- Patient has radiating back pain which he states started about four months ago.  He has not taken any tylenol for this pain as he states that he "does not believe in tylenol".  He has tried heat packs but denies any complete relief. He did try the Prednisone and Flexeril given to him in July and states that these medications helped "just a little bit".     Difficulty sleeping: Patient states that he has not been able to sleep "in months" (struggles with getting to sleep).  He denies any racing thoughts and states that he sits up all night long looking at the television (in his living  room).  He does not have a television in his bedroom.  Denies any history of depression or anxiety.  Patient states that his sleeping problem has gotten worse in the past 6-8 months.  He does not nap during the day and states that when he does feel some drowsiness coming on, he closes his eyes for a second and is right back up afterwards.  Patient has never been told that he has sleep apnea.  His fiance has told him that he snores. He has not had coffee in the past 3.5 months, eats his last meal around 8 o clock and walks for exercise.  Patient has about 2-3 beers per week.   Patient Active Problem List   Diagnosis Date Noted  . Anemia, iron deficiency   . Gastrointestinal  bleeding 10/24/2015  . Absolute anemia   . Tobacco abuse   . Acute ischemic stroke ([place])   . Acute CVA (cerebrovascular accident) ([place]) 05/31/2015  . Stroke ([place]) 05/31/2015  . Dyspnea 04/10/2014  . HTN (hypertension) 04/10/2014  . Inguinal hernia without mention of obstruction or gangrene, unilateral or unspecified, (not specified as recurrent)-right 01/30/2014   Past Medical History:  Diagnosis Date  . Arthritis   . Chronic back pain   . GERD (gastroesophageal reflux disease)    uses Baking Soda  . GIB (gastrointestinal bleeding) 2012  . Glaucoma    right eye  . Headache    occasionally  . Hepatitis C   . History of blood transfusion    no abnormal reaction noted  . History of gout    doesn't take any meds  . Hyperlipidemia    not on any meds  . Hypertension    takes Amlodipine and Lisinopril daily  . Insomnia    takes Ambien nightly  . Ischemic colitis ([place]) 2012  . Joint pain   . Nocturia   . Numbness    both legs occasionally  . PONV (postoperative nausea and vomiting)   . Prostate cancer ([place])   . Shortness of breath dyspnea    rarely but when notices he can be lying/sitting/exertion.Dr.Hochrein is aware per pt  . Stroke ([place]) 2016   . Urinary frequency   . Urinary urgency    Past Surgical History:  Procedure Laterality Date  . APPENDECTOMY    . COLONOSCOPY    . COLONOSCOPY N/A 10/26/2015   Procedure: COLONOSCOPY;  Surgeon: Doran Stabler, MD;  Location: [facility] ENDOSCOPY;  Service: Endoscopy;  Laterality: N/A;  . ESOPHAGOGASTRODUODENOSCOPY    . ESOPHAGOGASTRODUODENOSCOPY N/A 10/26/2015   Procedure: ESOPHAGOGASTRODUODENOSCOPY (EGD);  Surgeon: Doran Stabler, MD;  Location: [facility] ENDOSCOPY;  Service: Endoscopy;  Laterality: N/A;  . INGUINAL HERNIA REPAIR Right 11/04/2014   Procedure: RIGHT INGUINAL HERNIA REPAIR WITH MESH;  Surgeon: Jackolyn Confer, MD;  Location: [place];  Service: General;  Laterality: Right;  . MASS EXCISION N/A 11/04/2014   Procedure: REMOVAL  OF RIGHT GROIN SOFT TISSUE MASS;  Surgeon: Jackolyn Confer, MD;  Location: [place];  Service: General;  Laterality: N/A;  . Multiple abdominal surgeries     total of 13  . PROSTATECTOMY    . SMALL INTESTINE SURGERY     Allergies  Allergen Reactions  . Penicillins Anaphylaxis    Has patient had a PCN reaction causing immediate rash, facial/tongue/throat swelling, SOB or lightheadedness with hypotension: Yes Has patient had a PCN reaction causing severe rash involving mucus membranes or skin necrosis: Unk Has patient had a PCN reaction that required  hospitalization: Unk Has patient had a PCN reaction occurring within the last 10 years: No If all of the above answers are "NO", then may proceed with Cephalosporin use..  . Sudafed [Pseudoephedrine] Other (See Comments)    Heart "races"  . Trazodone And Nefazodone Swelling  . Tomato Rash and Other (See Comments)    Indigestion (also)   Prior to Admission medications   Medication Sig Start Date End Date Taking? Authorizing Provider  ferrous sulfate 325 (65 FE) MG tablet Take 1 tablet (325 mg total) by mouth daily with breakfast. 10/26/15  Yes Delfina Redwood, MD  HYDROcodone-acetaminophen (NORCO/VICODIN) 5-325 MG tablet Take 1 tablet by mouth every 6 (six) hours as needed for severe pain. 06/04/15  Yes Vann, Jessica U, DO  lisinopril (PRINIVIL,ZESTRIL) 40 MG tablet Take 1 tablet (40 mg total) by mouth daily. 01/03/17  Yes Leaphart, Zack Seal, PA-C  LORazepam (ATIVAN) 0.5 MG tablet Take 1 tablet (0.5 mg total) by mouth every 8 (eight) hours as needed for anxiety. 06/04/15  Yes Vann, Jessica U, DO  nicotine (NICODERM CQ - DOSED IN MG/24 HOURS) 14 mg/24hr patch Place 1 patch (14 mg total) onto the skin daily. 06/04/15  Yes Geradine Girt, DO  predniSONE (DELTASONE) 20 MG tablet Take 2 tablets (40 mg total) by mouth daily. 02/14/17  Yes Montine Circle, PA-C  senna-docusate (SENOKOT-S) 8.6-50 MG tablet Take 1 tablet by mouth at bedtime as needed for  moderate constipation. 06/04/15  Yes Vann, Jessica U, DO  Suvorexant (BELSOMRA) 15 MG TABS Take 1 tablet by mouth at bedtime. 02/24/16  Yes Nafziger, Tommi Rumps, NP  zolpidem (AMBIEN) 10 MG tablet Take 1 tablet (10 mg total) by mouth at bedtime. 01/21/16  Yes Nafziger, Tommi Rumps, NP  cloNIDine (CATAPRES) 0.1 MG tablet Take 1 tablet (0.1 mg total) by mouth daily. Patient not taking: Reported on 06/23/2017 01/03/17   Ocie Cornfield T, PA-C  cyclobenzaprine (FLEXERIL) 10 MG tablet Take 1 tablet (10 mg total) by mouth 2 (two) times daily as needed for muscle spasms. Patient not taking: Reported on 06/23/2017 02/14/17   Montine Circle, PA-C  diclofenac sodium (VOLTAREN) 1 % GEL Apply 2 g topically 3 (three) times daily as needed (Severe knee pain). Patient not taking: Reported on 06/23/2017 06/04/15   Geradine Girt, DO  erythromycin ophthalmic ointment Place a 1/2 inch ribbon of ointment into the lower eyelid four times per day Patient not taking: Reported on 02/14/2017 01/03/17   Doristine Devoid, PA-C   Social History   Socioeconomic History  . Marital status: Legally Separated    Spouse name: Not on file  . Number of children: 2  . Years of education: Not on file  . Highest education level: Not on file  Social Needs  . Financial resource strain: Not on file  . Food insecurity - worry: Not on file  . Food insecurity - inability: Not on file  . Transportation needs - medical: Not on file  . Transportation needs - non-medical: Not on file  Occupational History  . Not on file  Tobacco Use  . Smoking status: Current Every Day Smoker    Packs/day: 0.50    Years: 50.00    Pack years: 25.00    Types: Cigarettes  . Smokeless tobacco: Never Used  Substance and Sexual Activity  . Alcohol use: Yes    Alcohol/week: 0.0 oz    Comment: occasionally  . Drug use: No  . Sexual activity: Yes  Other Topics Concern  .  Not on file  Social History Narrative   One living child .  One murdered.  Lives with  girlfriend.        Review of Systems  Constitutional: Negative for chills and fever.  Eyes: Negative for pain, redness and itching.  Respiratory: Negative for cough, choking and shortness of breath.   Gastrointestinal: Negative for nausea and vomiting.  Musculoskeletal: Positive for back pain and neck pain.       Neck cramping/pulsating.   Neurological: Negative for dizziness, syncope, speech difficulty and light-headedness.  Psychiatric/Behavioral:       Difficulty falling asleep       Objective:   Physical Exam  Constitutional: He is oriented to person, place, and time. He appears well-developed and well-nourished.  HENT:  Head: Normocephalic and atraumatic.  Eyes: EOM are normal. Pupils are equal, round, and reactive to light.  Neck: No JVD present. Carotid bruit is not present.  No carotid bruit, pulsatile mass right interior neck. visible pulsations on the left as well but not as enlarged as the right.  Right side measured approximately 5 cms by 3 cms.   Cardiovascular: Normal rate, regular rhythm and normal heart sounds.  No murmur heard. Pulmonary/Chest: Effort normal and breath sounds normal. He has no rales.  Abdominal:  No apparent pulsatile mass in the abdomen.   Musculoskeletal: He exhibits no edema.  Neurological: He is alert and oriented to person, place, and time.  Skin: Skin is warm and dry.  Psychiatric: He has a normal mood and affect.  Vitals reviewed.   Vitals:   06/23/17 1118 06/23/17 1141  BP: (!) 178/78 (!) 195/98  Pulse: 79   Resp: 16   SpO2: 100%   Weight: 209 lb (94.8 kg)   Height: 5\' 9"  (1.753 m)        Assessment & Plan:    Toby Breithaupt is a 66 y.o. male Essential hypertension - Plan: lisinopril (PRINIVIL,ZESTRIL) 40 MG tablet, Basic metabolic panel, hydrochlorothiazide (MICROZIDE) 12.5 MG capsule Nonadherence to medication  - importance of med adherence discussed, especially with prior lacunar infarct in November 2016.   -  check BMP  -restart lisinopril at 40mg  dose, add HCTZ 12.5mg  as reported 2-3 med regimen prior. Monitor home readings, RTC/ER precautions if symptomatic and orthostatic precautions.   - recheck in 1-2 weeks.   Snoring - Plan: Ambulatory referral to Sleep Studies Insomnia, unspecified type - Plan: Ambulatory referral to Sleep Studies, hydrOXYzine (ATARAX/VISTARIL) 10 MG tablet  - refer to sleep studies, sleep hygiene reviewed.   - short term hydroxyzine prescribed. Side effects discussed.   Pulsatile neck mass - Plan: US Soft Tissue Head/Neck, US Carotid Duplex Bilateral  - sent for ultrasound that indicates likely carotid aneurysms. Reportedly these have been there for some time but becoming more symptomatic with discomfort - possible mass effect.   - will likely ned CT for further characterization, but may also need further intracranial vascular imaging. Will discuss with vascular surgery to determine next step. ER precautions given if any acute changes in symptoms.   Plan discussed with patient on phone. He lost lisinopril Rx. Reordered/sent electronically to [facility]  06/24/17 addendum 2:30 PM Discuss with vascular surgeon. Will obtain CTA neck for improved measurements and evaluation, as well as CTA head for intracranial vascular evaluation.  Will refer outpatient to vascular surgery, but advised patient that if he has any acute pain, lightheadedness, dizziness, should be seen in the emergency room.understanding expressed.   Meds ordered this encounter  Medications  . lisinopril (PRINIVIL,ZESTRIL) 40 MG tablet    Sig: Take 1 tablet (40 mg total) by mouth daily.    Dispense:  90 tablet    Refill:  1  . hydrochlorothiazide (MICROZIDE) 12.5 MG capsule    Sig: Take 1 capsule (12.5 mg total) by mouth daily.    Dispense:  90 capsule    Refill:  1  . hydrOXYzine (ATARAX/VISTARIL) 10 MG tablet    Sig: Take 1-2 tablets (10-20 mg total) by mouth at bedtime as needed.    Dispense:  30  tablet    Refill:  1   Patient Instructions   You are scheduled for an Ultrasound at [facility] today. Please arrive at the main entrance to the hospital and you will be guided to the radiology department.   Restart lisinopril, and start hydrochlorothiazide for blood pressure.  Keep a record of your blood pressures outside of the office and bring them to the next office visit. If any lightheadedness or low readings, stop hydrochlorothiazide and follow up to discuss..  Either way follow up in next 2 weeks for blood pressure.   I would like you to meet with sleep specialist to discuss your insomnia. See information below on insomnia. You can try hydroxyzine at bedtime (no more than 2 pills) for now - stop that medicine if that causes any dizziness or lightheadedness.   I will refer you for ultrasound of your neck arteries and the pulsatile areas on your exam.   Tylenol for upper and lower back pain may be safest for now. Heat or ice as needed. Return in next 2 weeks to discuss those areas further and possible xrays.  Once blood pressure is controlled, we have other medication options.  Return to the clinic or go to the nearest emergency room if any of your symptoms worsen or new symptoms occur.   Insomnia Insomnia is a sleep disorder that makes it difficult to fall asleep or to stay asleep. Insomnia can cause tiredness (fatigue), low energy, difficulty concentrating, mood swings, and poor performance at work or school. There are three different ways to classify insomnia:  Difficulty falling asleep.  Difficulty staying asleep.  Waking up too early in the morning.  Any type of insomnia can be long-term (chronic) or short-term (acute). Both are common. Short-term insomnia usually lasts for three months or less. Chronic insomnia occurs at least three times a week for longer than three months. What are the causes? Insomnia may be caused by another condition, situation, or substance,  such as:  Anxiety.  Certain medicines.  Gastroesophageal reflux disease (GERD) or other gastrointestinal conditions.  Asthma or other breathing conditions.  Restless legs syndrome, sleep apnea, or other sleep disorders.  Chronic pain.  Menopause. This may include hot flashes.  Stroke.  Abuse of alcohol, tobacco, or illegal drugs.  Depression.  Caffeine.  Neurological disorders, such as Alzheimer disease.  An overactive thyroid (hyperthyroidism).  The cause of insomnia may not be known. What increases the risk? Risk factors for insomnia include:  Gender. Women are more commonly affected than men.  Age. Insomnia is more common as you get older.  Stress. This may involve your professional or personal life.  Income. Insomnia is more common in people with lower income.  Lack of exercise.  Irregular work schedule or night shifts.  Traveling between different time zones.  What are the signs or symptoms? If you have insomnia, trouble falling asleep or trouble staying asleep is the main  symptom. This may lead to other symptoms, such as:  Feeling fatigued.  Feeling nervous about going to sleep.  Not feeling rested in the morning.  Having trouble concentrating.  Feeling irritable, anxious, or depressed.  How is this treated? Treatment for insomnia depends on the cause. If your insomnia is caused by an underlying condition, treatment will focus on addressing the condition. Treatment may also include:  Medicines to help you sleep.  Counseling or therapy.  Lifestyle adjustments.  Follow these instructions at home:  Take medicines only as directed by your health care provider.  Keep regular sleeping and waking hours. Avoid naps.  Keep a sleep diary to help you and your health care provider figure out what could be causing your insomnia. Include: ? When you sleep. ? When you wake up during the night. ? How well you sleep. ? How rested you feel the next  day. ? Any side effects of medicines you are taking. ? What you eat and drink.  Make your bedroom a comfortable place where it is easy to fall asleep: ? Put up shades or special blackout curtains to block light from outside. ? Use a white noise machine to block noise. ? Keep the temperature cool.  Exercise regularly as directed by your health care provider. Avoid exercising right before bedtime.  Use relaxation techniques to manage stress. Ask your health care provider to suggest some techniques that may work well for you. These may include: ? Breathing exercises. ? Routines to release muscle tension. ? Visualizing peaceful scenes.  Cut back on alcohol, caffeinated beverages, and cigarettes, especially close to bedtime. These can disrupt your sleep.  Do not overeat or eat spicy foods right before bedtime. This can lead to digestive discomfort that can make it hard for you to sleep.  Limit screen use before bedtime. This includes: ? Watching TV. ? Using your smartphone, tablet, and computer.  Stick to a routine. This can help you fall asleep faster. Try to do a quiet activity, brush your teeth, and go to bed at the same time each night.  Get out of bed if you are still awake after 15 minutes of trying to sleep. Keep the lights down, but try reading or doing a quiet activity. When you feel sleepy, go back to bed.  Make sure that you drive carefully. Avoid driving if you feel very sleepy.  Keep all follow-up appointments as directed by your health care provider. This is important. Contact a health care provider if:  You are tired throughout the day or have trouble in your daily routine due to sleepiness.  You continue to have sleep problems or your sleep problems get worse. Get help right away if:  You have serious thoughts about hurting yourself or someone else. This information is not intended to replace advice given to you by your health care provider. Make sure you discuss any  questions you have with your health care provider. Document Released: 07/02/2000 Document Revised: 12/05/2015 Document Reviewed: 04/05/2014 Elsevier Interactive Patient Education  2018 [place] offers smoking cessation clinics. Registration is required. To register call [phone] or register online at [email or web address]  Steps to Quit Smoking Smoking tobacco can be bad for your health. It can also affect almost every organ in your body. Smoking puts you and people around you at risk for many serious long-lasting (chronic) diseases. Quitting smoking is hard, but it is one of the best things that you can do for your health. It is  never too late to quit. What are the benefits of quitting smoking? When you quit smoking, you lower your risk for getting serious diseases and conditions. They can include:  Lung cancer or lung disease.  Heart disease.  Stroke.  Heart attack.  Not being able to have children (infertility).  Weak bones (osteoporosis) and broken bones (fractures).  If you have coughing, wheezing, and shortness of breath, those symptoms may get better when you quit. You may also get sick less often. If you are pregnant, quitting smoking can help to lower your chances of having a baby of low birth weight. What can I do to help me quit smoking? Talk with your doctor about what can help you quit smoking. Some things you can do (strategies) include:  Quitting smoking totally, instead of slowly cutting back how much you smoke over a period of time.  Going to in-person counseling. You are more likely to quit if you go to many counseling sessions.  Using resources and support systems, such as: ? Database administrator with a Social worker. ? Phone quitlines. ? Careers information officer. ? Support groups or group counseling. ? Text messaging programs. ? Mobile phone apps or applications.  Taking medicines. Some of these medicines may have nicotine in them. If you are  pregnant or breastfeeding, do not take any medicines to quit smoking unless your doctor says it is okay. Talk with your doctor about counseling or other things that can help you.  Talk with your doctor about using more than one strategy at the same time, such as taking medicines while you are also going to in-person counseling. This can help make quitting easier. What things can I do to make it easier to quit? Quitting smoking might feel very hard at first, but there is a lot that you can do to make it easier. Take these steps:  Talk to your family and friends. Ask them to support and encourage you.  Call phone quitlines, reach out to support groups, or work with a Social worker.  Ask people who smoke to not smoke around you.  Avoid places that make you want (trigger) to smoke, such as: ? Bars. ? Parties. ? Smoke-break areas at work.  Spend time with people who do not smoke.  Lower the stress in your life. Stress can make you want to smoke. Try these things to help your stress: ? Getting regular exercise. ? Deep-breathing exercises. ? Yoga. ? Meditating. ? Doing a body scan. To do this, close your eyes, focus on one area of your body at a time from head to toe, and notice which parts of your body are tense. Try to relax the muscles in those areas.  Download or buy apps on your mobile phone or tablet that can help you stick to your quit plan. There are many free apps, such as QuitGuide from the [employer] Office manager for Disease Control and Prevention). You can find more support from smokefree.gov and other websites.  This information is not intended to replace advice given to you by your health care provider. Make sure you discuss any questions you have with your health care provider. Document Released: 05/01/2009 Document Revised: 03/02/2016 Document Reviewed: 11/19/2014 Elsevier Interactive Patient Education  2018 [employer].    IF you received an x-ray today, you will receive an invoice  from [facility]. Please contact [facility] at [phone] with questions or concerns regarding your invoice.   IF you received labwork today, you will receive an invoice from [facility].  Please contact [facility] at [phone] with questions or concerns regarding your invoice.   Our billing staff will not be able to assist you with questions regarding bills from these companies.  You will be contacted with the lab results as soon as they are available. The fastest way to get your results is to activate your My Chart account. Instructions are located on the last page of this paperwork. If you have not heard from [place] regarding the results in 2 weeks, please contact this office.       I personally performed the services described in this documentation, which was scribed in my presence. The recorded information has been reviewed and considered for accuracy and completeness, addended by me as needed, and agree with information above.  Signed,   Merri Ray, MD [facility].  06/23/17 6:47 PM

## 2017-06-24 ENCOUNTER — Other Ambulatory Visit: Payer: Medicare Other

## 2017-06-24 LAB — BASIC METABOLIC PANEL
BUN/Creatinine Ratio: 16 (ref 10–24)
BUN: 14 mg/dL (ref 8–27)
CO2: 22 mmol/L (ref 20–29)
Calcium: 9.3 mg/dL (ref 8.6–10.2)
Chloride: 106 mmol/L (ref 96–106)
Creatinine, Ser: 0.87 mg/dL (ref 0.76–1.27)
GFR calc Af Amer: 103 mL/min/{1.73_m2} (ref >59)
GFR calc non Af Amer: 89 mL/min/{1.73_m2} (ref >59)
Glucose: 86 mg/dL (ref 65–99)
Potassium: 4.5 mmol/L (ref 3.5–5.2)
Sodium: 143 mmol/L (ref 134–144)

## 2017-06-24 NOTE — Addendum Note (Signed)
Addended by: Merri Ray R on: 06/24/2017 02:37 PM   Modules accepted: Orders

## 2017-06-27 ENCOUNTER — Other Ambulatory Visit: Payer: Medicare Other

## 2017-06-30 NOTE — Progress Notes (Signed)
Lab letter sent 

## 2017-07-04 ENCOUNTER — Other Ambulatory Visit: Payer: Medicare Other

## 2017-07-07 ENCOUNTER — Ambulatory Visit: Payer: Medicare Other | Admitting: Family Medicine

## 2017-07-11 ENCOUNTER — Other Ambulatory Visit: Payer: Self-pay

## 2017-07-11 ENCOUNTER — Ambulatory Visit (INDEPENDENT_AMBULATORY_CARE_PROVIDER_SITE_OTHER): Payer: Medicare Other

## 2017-07-11 ENCOUNTER — Ambulatory Visit (INDEPENDENT_AMBULATORY_CARE_PROVIDER_SITE_OTHER): Payer: Medicare Other | Admitting: Family Medicine

## 2017-07-11 ENCOUNTER — Encounter: Payer: Self-pay | Admitting: Family Medicine

## 2017-07-11 VITALS — BP 180/90 | HR 74 | Temp 98.2°F | Resp 18 | Ht 69.0 in | Wt 208.6 lb

## 2017-07-11 DIAGNOSIS — I10 Essential (primary) hypertension: Secondary | ICD-10-CM | POA: Diagnosis not present

## 2017-07-11 DIAGNOSIS — M5442 Lumbago with sciatica, left side: Secondary | ICD-10-CM

## 2017-07-11 DIAGNOSIS — I720 Aneurysm of carotid artery: Secondary | ICD-10-CM

## 2017-07-11 DIAGNOSIS — Z8673 Personal history of transient ischemic attack (TIA), and cerebral infarction without residual deficits: Secondary | ICD-10-CM | POA: Diagnosis not present

## 2017-07-11 MED ORDER — HYDROCODONE-ACETAMINOPHEN 5-325 MG PO TABS: 1.0000 | ORAL_TABLET | Freq: Four times a day (QID) | ORAL | 0 refills | Status: DC | PRN

## 2017-07-11 MED ORDER — AMLODIPINE BESYLATE 5 MG PO TABS: 5.0000 mg | ORAL_TABLET | Freq: Every day | ORAL | 1 refills | Status: DC

## 2017-07-11 MED ORDER — PREDNISONE 20 MG PO TABS: ORAL_TABLET | ORAL | 0 refills | Status: DC

## 2017-07-11 NOTE — Progress Notes (Signed)
Subjective:  By signing my name below, I, [name], attest that this documentation has been prepared under the direction and in the presence of [name], MD Electronically Signed: Ladene Artist, ED Scribe 07/11/2017 at 10:40 AM.   Patient ID: [name], male    DOB: 27-May-1951, 66 y.o.   MRN: [id number]  Chief Complaint  Patient presents with  . Back Pain    having nerve pain on left back and hip pain   . Hypertension    follow up    HPI [name] is a 66 y.o. male who presents to Primary Care at [facility] for f/u. See last visit.   Suspected Carotid Artery Aneurysms Noted on [place]. Discussed with vascular surgery. CT ordered and CT angiogram. As well as outpatient eval with vascular unless symptomatic, then proceed to ER.  HTN Uncontrolled last visit. H/o non-adherence to meds in lacunar infarct 05/2015. Restarted Lisinopril 40 mg qd, HCTZ 12.5 mg added and was referred to sleep study with h/o snoring. Hydroxyzine for sleep. Pt states he has been compliant with BP meds. His sleep study is scheduled for 07/24/17. Denies cp, sob, weakness, lightheadedness, dizziness.  Low Back Pain Seen in July for radicular pain to L leg/foot. Reported radiating back pain x 4 months. Prednisone and flexeril in July helped some. No recent lumbar imaging.  Pt states that back pain has been radiating into the L knee x 4 months and keeps him up at night. He reports increased pain with standing. Today, he states Flexeril and Prednisone did not seem to improve pain. Pt is currently ambulating with a cane. Denies fall/injury, fever, unexplained weight loss, night sweats, bladder/bowel incontinence, weakness.  Patient Active Problem List   Diagnosis Date Noted  . Anemia, iron deficiency   . Gastrointestinal bleeding 10/24/2015  . Absolute anemia   . Tobacco abuse   . Acute ischemic stroke ([place])   . Acute CVA (cerebrovascular accident) ([place]) 05/31/2015  . Stroke ([place]) 05/31/2015    . Dyspnea 04/10/2014  . HTN (hypertension) 04/10/2014  . Inguinal hernia without mention of obstruction or gangrene, unilateral or unspecified, (not specified as recurrent)-right 01/30/2014   Past Medical History:  Diagnosis Date  . Arthritis   . Chronic back pain   . GERD (gastroesophageal reflux disease)    uses Baking Soda  . GIB (gastrointestinal bleeding) 2012  . Glaucoma    right eye  . Headache    occasionally  . Hepatitis C   . History of blood transfusion    no abnormal reaction noted  . History of gout    doesn't take any meds  . Hyperlipidemia    not on any meds  . Hypertension    takes Amlodipine and Lisinopril daily  . Insomnia    takes Ambien nightly  . Ischemic colitis ([place]) 2012  . Joint pain   . Nocturia   . Numbness    both legs occasionally  . PONV (postoperative nausea and vomiting)   . Prostate cancer ([place])   . Shortness of breath dyspnea    rarely but when notices he can be lying/sitting/exertion.Dr.Hochrein is aware per pt  . Stroke ([place]) 2016   . Urinary frequency   . Urinary urgency    Past Surgical History:  Procedure Laterality Date  . APPENDECTOMY    . COLONOSCOPY    . COLONOSCOPY N/A 10/26/2015   Procedure: COLONOSCOPY;  Surgeon: Doran Stabler, MD;  Location: [facility] ENDOSCOPY;  Service: Endoscopy;  Laterality: N/A;  . ESOPHAGOGASTRODUODENOSCOPY    . ESOPHAGOGASTRODUODENOSCOPY N/A 10/26/2015   Procedure: ESOPHAGOGASTRODUODENOSCOPY (EGD);  Surgeon: Doran Stabler, MD;  Location: [facility] ENDOSCOPY;  Service: Endoscopy;  Laterality: N/A;  . INGUINAL HERNIA REPAIR Right 11/04/2014   Procedure: RIGHT INGUINAL HERNIA REPAIR WITH MESH;  Surgeon: Jackolyn Confer, MD;  Location: [facility];  Service: General;  Laterality: Right;  . MASS EXCISION N/A 11/04/2014   Procedure: REMOVAL OF RIGHT GROIN SOFT TISSUE MASS;  Surgeon: Jackolyn Confer, MD;  Location: [facility];  Service: General;  Laterality: N/A;  . Multiple abdominal surgeries     total of 13  .  PROSTATECTOMY    . SMALL INTESTINE SURGERY     Allergies  Allergen Reactions  . Penicillins Anaphylaxis    Has patient had a PCN reaction causing immediate rash, facial/tongue/throat swelling, SOB or lightheadedness with hypotension: Yes Has patient had a PCN reaction causing severe rash involving mucus membranes or skin necrosis: Unk Has patient had a PCN reaction that required hospitalization: Unk Has patient had a PCN reaction occurring within the last 10 years: No If all of the above answers are "NO", then may proceed with Cephalosporin use..  . Sudafed [Pseudoephedrine] Other (See Comments)    Heart "races"  . Trazodone And Nefazodone Swelling  . Tomato Rash and Other (See Comments)    Indigestion (also)   Prior to Admission medications   Medication Sig Start Date End Date Taking? Authorizing Provider  hydrochlorothiazide (MICROZIDE) 12.5 MG capsule Take 1 capsule (12.5 mg total) by mouth daily. 06/23/17   [name], MD  hydrOXYzine (ATARAX/VISTARIL) 10 MG tablet Take 1-2 tablets (10-20 mg total) by mouth at bedtime as needed. 06/23/17   [name], MD  lisinopril (PRINIVIL,ZESTRIL) 40 MG tablet Take 1 tablet (40 mg total) by mouth daily. 06/23/17   [name], MD   Social History   Socioeconomic History  . Marital status: Legally Separated    Spouse name: Not on file  . Number of children: 2  . Years of education: Not on file  . Highest education level: Not on file  Social Needs  . Financial resource strain: Not on file  . Food insecurity - worry: Not on file  . Food insecurity - inability: Not on file  . Transportation needs - medical: Not on file  . Transportation needs - non-medical: Not on file  Occupational History  . Not on file  Tobacco Use  . Smoking status: Current Every Day Smoker    Packs/day: 0.50    Years: 50.00    Pack years: 25.00    Types: Cigarettes  . Smokeless tobacco: Never Used  Substance and Sexual Activity  . Alcohol use:  Yes    Alcohol/week: 0.0 oz    Comment: occasionally  . Drug use: No  . Sexual activity: Yes  Other Topics Concern  . Not on file  Social History Narrative   One living child .  One murdered.  Lives with girlfriend.     Review of Systems  Constitutional: Negative for diaphoresis, fever and unexpected weight change.  Respiratory: Negative for shortness of breath.   Cardiovascular: Negative for chest pain.  Genitourinary: Negative for enuresis.  Musculoskeletal: Positive for back pain.  Neurological: Negative for dizziness, weakness and light-headedness.      Objective:   Physical Exam  Constitutional: He is oriented to person, place, and time. He appears well-developed and well-nourished.  HENT:  Head: Normocephalic and atraumatic.  Eyes:  EOM are normal. Pupils are equal, round, and reactive to light.  Neck: No JVD present. Carotid bruit is not present.  Pulsatile neck masses bilaterally, R greater than L.  Cardiovascular: Normal rate, regular rhythm and normal heart sounds.  No murmur heard. Pulmonary/Chest: Effort normal and breath sounds normal. He has no rales.  Musculoskeletal: He exhibits no edema.  Slight tenderness along L lumber area as well as into the L sciatic notch. Pain free internal/external  rotation of L hip. Neg seated straight leg raise.  Neurological: He is alert and oriented to person, place, and time.  Reflex Scores:      Patellar reflexes are 1+ on the right side and 2+ on the left side.      Achilles reflexes are 2+ on the right side and 2+ on the left side. Skin: Skin is warm and dry.  Psychiatric: He has a normal mood and affect.  Vitals reviewed.  Vitals:   07/11/17 0944 07/11/17 1016  BP: (!) 164/98 (!) 180/90  Pulse: 74   Resp: 18   Temp: 98.2 F (36.8 C)   TempSrc: Oral   SpO2: 100%   Weight: 208 lb 9.6 oz (94.6 kg)   Height: 5\' 9"  (1.753 m)    Dg Lumbar Spine Complete  Result Date: 07/11/2017 CLINICAL DATA:  Lumbago with left-sided  sciatica EXAM: LUMBAR SPINE - COMPLETE 4+ VIEW COMPARISON:  None. FINDINGS: Frontal, lateral, spot lumbosacral lateral, and bilateral oblique views were obtained. There are 5 non-rib-bearing lumbar type vertebral bodies. There is no fracture or spondylolisthesis. There is moderately severe disc space narrowing at L5-S1. There is mild disc space narrowing at L4-5. There is mild the space narrowing in the visualized lower thoracic regions. There is anterior osteophytic change in the lower thoracic spine as well as at L1 and L2. There is facet osteoarthritic change at all levels bilaterally in the lumbar region. There is aortic atherosclerosis. IMPRESSION: Multilevel arthropathy. No fracture or spondylolisthesis. There is aortic atherosclerosis. Aortic Atherosclerosis (ICD10-I70.0). Electronically Signed   By: Lowella Grip III M.D.   On: 07/11/2017 11:25      Assessment & Plan:  [name] is a 66 y.o. male Essential hypertension - Plan: amLODipine (NORVASC) 5 MG tablet History of lacunar cerebrovascular accident (CVA)  - Improved but still decreased control. Stressed importance of control with history of lacunar CVA  - Increase HCTZ to 25 mg daily, continue same dose of lisinopril, add amlodipine 5 mg daily  Carotid aneurysm, left ([place]) - Plan: Ambulatory referral to Vascular Surgery Carotid aneurysm, right ([place]) - Plan: Ambulatory referral to Vascular Surgery  -CT ordered, not yet performed. Referral to vascular surgery, no new symptoms, ER precautions given if symptomatic  Left-sided low back pain with left-sided sciatica, unspecified chronicity - Plan: DG Lumbar Spine Complete, predniSONE (DELTASONE) 20 MG tablet, HYDROcodone-acetaminophen (NORCO/VICODIN) 5-325 MG tablet  -Degenerative disc disease as above, possible HNP.   -Repeat trial of prednisone taper, hydrocodone if needed for breakthrough pain, potential side effects discussed. Recheck in the next 2 weeks to decide if ortho eval  or advanced imaging needed  Meds ordered this encounter  Medications  . amLODipine (NORVASC) 5 MG tablet    Sig: Take 1 tablet (5 mg total) by mouth daily.    Dispense:  90 tablet    Refill:  1  . predniSONE (DELTASONE) 20 MG tablet    Sig: 3 by mouth for 3 days, then 2 by mouth for 2 days, then 1 by  mouth for 2 days, then 1/2 by mouth for 2 days.    Dispense:  16 tablet    Refill:  0  . HYDROcodone-acetaminophen (NORCO/VICODIN) 5-325 MG tablet    Sig: Take 1 tablet by mouth every 6 (six) hours as needed for moderate pain.    Dispense:  20 tablet    Refill:  0   Patient Instructions   Increase hydrochlorothiazide to 2 pills per day (total of 25mg  per day), continue lisinopril 40mg  per day,  Add amlodipine 5mg  per day. Recheck blood pressure at office visit in next 2 weeks.   I will refer you to vascular specialist for carotid aneurysm, and they should be calling about CT scan soon. Go to the nearest emergency room if any of your symptoms worsen or new symptoms occur.  For back pain, can take hydrocodone up to every 6 hours if needed - especially at night. Tylenol if needed if milder pain. We can try prednisone once more to see if that will provide relief.  If that is not helping, then may need evaluation with orthopaedics or MRI. Recheck in next 2 weeks.    Back Pain, Adult Many adults have back pain from time to time. Common causes of back pain include:  A strained muscle or ligament.  Wear and tear (degeneration) of the spinal disks.  Arthritis.  A hit to the back.  Back pain can be short-lived (acute) or last a long time (chronic). A physical exam, lab tests, and imaging studies may be done to find the cause of your pain. Follow these instructions at home: Managing pain and stiffness  Take over-the-counter and prescription medicines only as told by your health care provider.  If directed, apply heat to the affected area as often as told by your health care provider. Use  the heat source that your health care provider recommends, such as a moist heat pack or a heating pad. ? Place a towel between your skin and the heat source. ? Leave the heat on for 20-30 minutes. ? Remove the heat if your skin turns bright red. This is especially important if you are unable to feel pain, heat, or cold. You have a greater risk of getting burned.  If directed, apply ice to the injured area: ? Put ice in a plastic bag. ? Place a towel between your skin and the bag. ? Leave the ice on for 20 minutes, 2-3 times a day for the first 2-3 days. Activity  Do not stay in bed. Resting more than 1-2 days can delay your recovery.  Take short walks on even surfaces as soon as you are able. Try to increase the length of time you walk each day.  Do not sit, drive, or stand in one place for more than 30 minutes at a time. Sitting or standing for long periods of time can put stress on your back.  Use proper lifting techniques. When you bend and lift, use positions that put less stress on your back: ? Goodlow your knees. ? Keep the load close to your body. ? Avoid twisting.  Exercise regularly as told by your health care provider. Exercising will help your back heal faster. This also helps prevent back injuries by keeping muscles strong and flexible.  Your health care provider may recommend that you see a physical therapist. This person can help you come up with a safe exercise program. Do any exercises as told by your physical therapist. Lifestyle  Maintain a healthy weight.  Extra weight puts stress on your back and makes it difficult to have good posture.  Avoid activities or situations that make you feel anxious or stressed. Learn ways to manage anxiety and stress. One way to manage stress is through exercise. Stress and anxiety increase muscle tension and can make back pain worse. General instructions  Sleep on a firm mattress in a comfortable position. Try lying on your side with your  knees slightly bent. If you lie on your back, put a pillow under your knees.  Follow your treatment plan as told by your health care provider. This may include: ? Cognitive or behavioral therapy. ? Acupuncture or massage therapy. ? Meditation or yoga. Contact a health care provider if:  You have pain that is not relieved with rest or medicine.  You have increasing pain going down into your legs or buttocks.  Your pain does not improve in 2 weeks.  You have pain at night.  You lose weight.  You have a fever or chills. Get help right away if:  You develop new bowel or bladder control problems.  You have unusual weakness or numbness in your arms or legs.  You develop nausea or vomiting.  You develop abdominal pain.  You feel faint. Summary  Many adults have back pain from time to time. A physical exam, lab tests, and imaging studies may be done to find the cause of your pain.  Use proper lifting techniques. When you bend and lift, use positions that put less stress on your back.  Take over-the-counter and prescription medicines and apply heat or ice as directed by your health care provider. This information is not intended to replace advice given to you by your health care provider. Make sure you discuss any questions you have with your health care provider. Document Released: 07/05/2005 Document Revised: 08/09/2016 Document Reviewed: 08/09/2016 Elsevier Interactive Patient Education  2018 [employer].    IF you received an x-ray today, you will receive an invoice from [facility]. Please contact [facility] at [phone] with questions or concerns regarding your invoice.   IF you received labwork today, you will receive an invoice from [facility]. Please contact [facility] at [phone] with questions or concerns regarding your invoice.   Our billing staff will not be able to assist you with questions regarding bills from these companies.  You will  be contacted with the lab results as soon as they are available. The fastest way to get your results is to activate your My Chart account. Instructions are located on the last page of this paperwork. If you have not heard from [place] regarding the results in 2 weeks, please contact this office.      I personally performed the services described in this documentation, which was scribed in my presence. The recorded information has been reviewed and considered for accuracy and completeness, addended by me as needed, and agree with information above.  Signed,   Merri Ray, MD [facility] [place].  07/11/17 3:02 PM

## 2017-07-11 NOTE — Patient Instructions (Addendum)
Increase hydrochlorothiazide to 2 pills per day (total of 25mg  per day), continue lisinopril 40mg  per day,  Add amlodipine 5mg  per day. Recheck blood pressure at office visit in next 2 weeks.   I will refer you to vascular specialist for carotid aneurysm, and they should be calling about CT scan soon. Go to the nearest emergency room if any of your symptoms worsen or new symptoms occur.  For back pain, can take hydrocodone up to every 6 hours if needed - especially at night. Tylenol if needed if milder pain. We can try prednisone once more to see if that will provide relief.  If that is not helping, then may need evaluation with orthopaedics or MRI. Recheck in next 2 weeks.    Back Pain, Adult Many adults have back pain from time to time. Common causes of back pain include:  A strained muscle or ligament.  Wear and tear (degeneration) of the spinal disks.  Arthritis.  A hit to the back.  Back pain can be short-lived (acute) or last a long time (chronic). A physical exam, lab tests, and imaging studies may be done to find the cause of your pain. Follow these instructions at home: Managing pain and stiffness  Take over-the-counter and prescription medicines only as told by your health care provider.  If directed, apply heat to the affected area as often as told by your health care provider. Use the heat source that your health care provider recommends, such as a moist heat pack or a heating pad. ? Place a towel between your skin and the heat source. ? Leave the heat on for 20-30 minutes. ? Remove the heat if your skin turns bright red. This is especially important if you are unable to feel pain, heat, or cold. You have a greater risk of getting burned.  If directed, apply ice to the injured area: ? Put ice in a plastic bag. ? Place a towel between your skin and the bag. ? Leave the ice on for 20 minutes, 2-3 times a day for the first 2-3 days. Activity  Do not stay in bed. Resting  more than 1-2 days can delay your recovery.  Take short walks on even surfaces as soon as you are able. Try to increase the length of time you walk each day.  Do not sit, drive, or stand in one place for more than 30 minutes at a time. Sitting or standing for long periods of time can put stress on your back.  Use proper lifting techniques. When you bend and lift, use positions that put less stress on your back: ? Carney your knees. ? Keep the load close to your body. ? Avoid twisting.  Exercise regularly as told by your health care provider. Exercising will help your back heal faster. This also helps prevent back injuries by keeping muscles strong and flexible.  Your health care provider may recommend that you see a physical therapist. This person can help you come up with a safe exercise program. Do any exercises as told by your physical therapist. Lifestyle  Maintain a healthy weight. Extra weight puts stress on your back and makes it difficult to have good posture.  Avoid activities or situations that make you feel anxious or stressed. Learn ways to manage anxiety and stress. One way to manage stress is through exercise. Stress and anxiety increase muscle tension and can make back pain worse. General instructions  Sleep on a firm mattress in a comfortable position. Try lying  on your side with your knees slightly bent. If you lie on your back, put a pillow under your knees.  Follow your treatment plan as told by your health care provider. This may include: ? Cognitive or behavioral therapy. ? Acupuncture or massage therapy. ? Meditation or yoga. Contact a health care provider if:  You have pain that is not relieved with rest or medicine.  You have increasing pain going down into your legs or buttocks.  Your pain does not improve in 2 weeks.  You have pain at night.  You lose weight.  You have a fever or chills. Get help right away if:  You develop new bowel or bladder  control problems.  You have unusual weakness or numbness in your arms or legs.  You develop nausea or vomiting.  You develop abdominal pain.  You feel faint. Summary  Many adults have back pain from time to time. A physical exam, lab tests, and imaging studies may be done to find the cause of your pain.  Use proper lifting techniques. When you bend and lift, use positions that put less stress on your back.  Take over-the-counter and prescription medicines and apply heat or ice as directed by your health care provider. This information is not intended to replace advice given to you by your health care provider. Make sure you discuss any questions you have with your health care provider. Document Released: 07/05/2005 Document Revised: 08/09/2016 Document Reviewed: 08/09/2016 Elsevier Interactive Patient Education  2018 [employer].    IF you received an x-ray today, you will receive an invoice from [facility]. Please contact [facility] at [phone] with questions or concerns regarding your invoice.   IF you received labwork today, you will receive an invoice from [facility]. Please contact [facility] at [phone] with questions or concerns regarding your invoice.   Our billing staff will not be able to assist you with questions regarding bills from these companies.  You will be contacted with the lab results as soon as they are available. The fastest way to get your results is to activate your My Chart account. Instructions are located on the last page of this paperwork. If you have not heard from [place] regarding the results in 2 weeks, please contact this office.

## 2017-07-15 ENCOUNTER — Telehealth: Payer: Self-pay | Admitting: Family Medicine

## 2017-07-15 NOTE — Telephone Encounter (Signed)
Copied from [place] [phone]. Topic: Quick Communication - See Telephone Encounter >> Jul 15, 2017  9:49 AM Ether Griffins B wrote: CRM for notification. See Telephone encounter for:  Pt wanting to let Dr. Carlota Raspberry know pain meds isnt working and would like something else called in  07/15/17.

## 2017-07-20 NOTE — Telephone Encounter (Signed)
Please see note below. 

## 2017-07-20 NOTE — Telephone Encounter (Signed)
Please clarify, has he taken prednisone? If not helping with prednisone, then will need to see orthopedics or possible MRI. How often is he taking hydrocodone?

## 2017-07-22 NOTE — Telephone Encounter (Signed)
See previous note

## 2017-07-26 ENCOUNTER — Emergency Department (HOSPITAL_COMMUNITY)
Admission: EM | Admit: 2017-07-26 | Discharge: 2017-07-26 | Disposition: A | Discharge status: Home / Self Care | Payer: Medicare Other | Attending: Emergency Medicine | Admitting: Emergency Medicine

## 2017-07-26 ENCOUNTER — Encounter (HOSPITAL_COMMUNITY): Payer: Self-pay | Admitting: *Deleted

## 2017-07-26 ENCOUNTER — Other Ambulatory Visit: Payer: Self-pay

## 2017-07-26 DIAGNOSIS — Z8673 Personal history of transient ischemic attack (TIA), and cerebral infarction without residual deficits: Secondary | ICD-10-CM | POA: Diagnosis not present

## 2017-07-26 DIAGNOSIS — Z8546 Personal history of malignant neoplasm of prostate: Secondary | ICD-10-CM | POA: Diagnosis not present

## 2017-07-26 DIAGNOSIS — F17210 Nicotine dependence, cigarettes, uncomplicated: Secondary | ICD-10-CM | POA: Diagnosis not present

## 2017-07-26 DIAGNOSIS — M5442 Lumbago with sciatica, left side: Secondary | ICD-10-CM | POA: Diagnosis not present

## 2017-07-26 DIAGNOSIS — M545 Low back pain: Secondary | ICD-10-CM | POA: Diagnosis present

## 2017-07-26 DIAGNOSIS — I10 Essential (primary) hypertension: Secondary | ICD-10-CM | POA: Insufficient documentation

## 2017-07-26 DIAGNOSIS — Z79899 Other long term (current) drug therapy: Secondary | ICD-10-CM | POA: Insufficient documentation

## 2017-07-26 DIAGNOSIS — G8929 Other chronic pain: Secondary | ICD-10-CM

## 2017-07-26 MED ORDER — ACETAMINOPHEN 325 MG PO TABS: 650.0000 mg | ORAL_TABLET | Freq: Four times a day (QID) | ORAL | 0 refills | Status: DC | PRN

## 2017-07-26 MED ORDER — MORPHINE SULFATE (PF) 4 MG/ML IV SOLN
4.0000 mg | Freq: Once | INTRAVENOUS | Status: AC
  Administered 2017-07-26: 4 mg via INTRAMUSCULAR
  Filled 2017-07-26: qty 1

## 2017-07-26 MED ORDER — DICLOFENAC SODIUM 3 % TD GEL: 1.0000 "application " | Freq: Two times a day (BID) | TRANSDERMAL | 0 refills | Status: DC

## 2017-07-26 NOTE — ED Triage Notes (Signed)
Pt is here with left lower back pain and radiates down left hip into leg.  Pt states the pain is getting worse.  Duration 2 months

## 2017-07-26 NOTE — ED Provider Notes (Signed)
[facility] EMERGENCY DEPARTMENT Provider Note   CSN: [id number] Arrival date & time: 07/26/17  1216     History   Chief Complaint Chief Complaint  Patient presents with  . Leg Pain    HPI [name] is a 67 y.o. male.  [name] is a 67 y.o. Male who presents to the ED complaining of sciatica.  Patient reports he has had lots of trouble with sciatica recently.  He reports over the past 4 months he has had worsening pain around his left low back that radiates down his left posterior buttocks and leg.  His pain is worse with movement.  He denies any injury or trauma to the area.  He has been seen by his PCP about a week ago who prescribed him prednisone and hydrocodone for breakthrough pain.  He reports he is out of the Percocet and that this did not help his pain.  He denies any new trauma or injury to his back.  He has not followed up with orthopedics yet.  He has been ambulating with a cane.  He denies fevers, numbness, weakness, loss of bladder control, loss of bowel control, rashes, history of IV drug use, or history of cancer.    The history is provided by the patient and medical records. No language interpreter was used.  Leg Pain   Pertinent negatives include no numbness.    Past Medical History:  Diagnosis Date  . Arthritis   . Chronic back pain   . GERD (gastroesophageal reflux disease)    uses Baking Soda  . GIB (gastrointestinal bleeding) 2012  . Glaucoma    right eye  . Headache    occasionally  . Hepatitis C   . History of blood transfusion    no abnormal reaction noted  . History of gout    doesn't take any meds  . Hyperlipidemia    not on any meds  . Hypertension    takes Amlodipine and Lisinopril daily  . Insomnia    takes Ambien nightly  . Ischemic colitis ([place]) 2012  . Joint pain   . Nocturia   . Numbness    both legs occasionally  . PONV (postoperative nausea and vomiting)   . Prostate cancer ([place])   .  Shortness of breath dyspnea    rarely but when notices he can be lying/sitting/exertion.Dr.Hochrein is aware per pt  . Stroke ([place]) 2016   . Urinary frequency   . Urinary urgency     Patient Active Problem List   Diagnosis Date Noted  . Anemia, iron deficiency   . Gastrointestinal bleeding 10/24/2015  . Absolute anemia   . Tobacco abuse   . Acute ischemic stroke ([place])   . Acute CVA (cerebrovascular accident) ([place]) 05/31/2015  . Stroke ([place]) 05/31/2015  . Dyspnea 04/10/2014  . HTN (hypertension) 04/10/2014  . Inguinal hernia without mention of obstruction or gangrene, unilateral or unspecified, (not specified as recurrent)-right 01/30/2014    Past Surgical History:  Procedure Laterality Date  . APPENDECTOMY    . COLONOSCOPY    . COLONOSCOPY N/A 10/26/2015   Procedure: COLONOSCOPY;  Surgeon: Doran Stabler, MD;  Location: [facility] ENDOSCOPY;  Service: Endoscopy;  Laterality: N/A;  . ESOPHAGOGASTRODUODENOSCOPY    . ESOPHAGOGASTRODUODENOSCOPY N/A 10/26/2015   Procedure: ESOPHAGOGASTRODUODENOSCOPY (EGD);  Surgeon: Doran Stabler, MD;  Location: [facility] ENDOSCOPY;  Service: Endoscopy;  Laterality: N/A;  . INGUINAL HERNIA REPAIR Right 11/04/2014   Procedure: RIGHT INGUINAL HERNIA  REPAIR WITH MESH;  Surgeon: Jackolyn Confer, MD;  Location: [facility];  Service: General;  Laterality: Right;  . MASS EXCISION N/A 11/04/2014   Procedure: REMOVAL OF RIGHT GROIN SOFT TISSUE MASS;  Surgeon: Jackolyn Confer, MD;  Location: [facility];  Service: General;  Laterality: N/A;  . Multiple abdominal surgeries     total of 13  . PROSTATECTOMY    . SMALL INTESTINE SURGERY         Home Medications    Prior to Admission medications   Medication Sig Start Date End Date Taking? Authorizing Provider  acetaminophen (TYLENOL) 325 MG tablet Take 2 tablets (650 mg total) by mouth every 6 (six) hours as needed for mild pain or moderate pain. 07/26/17   Waynetta Pean, PA-C  amLODipine (NORVASC) 5 MG tablet Take 1 tablet (5  mg total) by mouth daily. 07/11/17   [name], MD  Diclofenac Sodium 3 % GEL Place 1 application onto the skin 2 (two) times daily. To affected area. 07/26/17   Waynetta Pean, PA-C  hydrochlorothiazide (MICROZIDE) 12.5 MG capsule Take 1 capsule (12.5 mg total) by mouth daily. 06/23/17   [name], MD  HYDROcodone-acetaminophen (NORCO/VICODIN) 5-325 MG tablet Take 1 tablet by mouth every 6 (six) hours as needed for moderate pain. 07/11/17   [name], MD  hydrOXYzine (ATARAX/VISTARIL) 10 MG tablet Take 1-2 tablets (10-20 mg total) by mouth at bedtime as needed. 06/23/17   [name], MD  lisinopril (PRINIVIL,ZESTRIL) 40 MG tablet Take 1 tablet (40 mg total) by mouth daily. 06/23/17   [name], MD  predniSONE (DELTASONE) 20 MG tablet 3 by mouth for 3 days, then 2 by mouth for 2 days, then 1 by mouth for 2 days, then 1/2 by mouth for 2 days. 07/11/17   [name], MD    Family History Family History  Problem Relation Age of Onset  . Alzheimer's disease Father   . Cancer Mother        Lymph node  . Heart failure Brother 45       Transplant 13 years ago  . CAD Brother   . Heart disease Sister 66       MI    Social History Social History   Tobacco Use  . Smoking status: Current Every Day Smoker    Packs/day: 0.50    Years: 50.00    Pack years: 25.00    Types: Cigarettes  . Smokeless tobacco: Never Used  Substance Use Topics  . Alcohol use: Yes    Alcohol/week: 0.0 oz    Comment: occasionally  . Drug use: No     Allergies   Penicillins; Sudafed [pseudoephedrine]; Trazodone and nefazodone; and Tomato   Review of Systems Review of Systems  Constitutional: Negative for fever.  Gastrointestinal: Negative for abdominal pain and vomiting.  Genitourinary: Negative for decreased urine volume, difficulty urinating, dysuria, flank pain, frequency and urgency.  Musculoskeletal: Positive for back pain.  Skin: Negative for rash.    Neurological: Negative for weakness and numbness.     Physical Exam Updated Vital Signs BP (!) 149/86 (BP Location: Left Arm)   Pulse 75   Temp 98.2 F (36.8 C) (Oral)   Resp 18   SpO2 100%   Physical Exam  Constitutional: He appears well-developed and well-nourished. No distress.  HENT:  Head: Normocephalic and atraumatic.  Eyes: Right eye exhibits no discharge. Left eye exhibits no discharge.  Cardiovascular: Normal rate, regular rhythm and intact distal  pulses.  Bilateral posterior tibialis pulses are intact.  Pulmonary/Chest: Effort normal. No respiratory distress.  Musculoskeletal: Normal range of motion. He exhibits tenderness. He exhibits no edema or deformity.  Tenderness to his left lateral low back and left posterior buttocks.  No overlying skin changes.  No crepitus or deformity.  Good strength plantar dorsiflexion bilaterally.  Neurological: He is alert. He displays normal reflexes. No sensory deficit. Coordination normal.  Patellar DTRs intact bilaterally.  No foot drop.  Ambulates with cane.  Skin: Skin is warm and dry. Capillary refill takes less than 2 seconds. No rash noted. He is not diaphoretic. No erythema. No pallor.  Psychiatric: He has a normal mood and affect. His behavior is normal.  Nursing note and vitals reviewed.    ED Treatments / Results  Labs (all labs ordered are listed, but only abnormal results are displayed) Labs Reviewed - No data to display  EKG  EKG Interpretation None       Radiology No results found.  Procedures Procedures (including critical care time)  Medications Ordered in ED Medications  morphine 4 MG/ML injection 4 mg (not administered)     Initial Impression / Assessment and Plan / ED Course  I have reviewed the triage vital signs and the nursing notes.  Pertinent labs & imaging results that were available during my care of the patient were reviewed by me and considered in my medical decision making (see chart  for details).    This  is a 67 y.o. Male who presents to the ED complaining of sciatica.  Patient reports he has had lots of trouble with sciatica recently.  He reports over the past 4 months he has had worsening pain around his left low back that radiates down his left posterior buttocks and leg.  His pain is worse with movement.  He denies any injury or trauma to the area.  He has been seen by his PCP about a week ago who prescribed him prednisone and hydrocodone for breakthrough pain.  He reports he is out of the Percocet and that this did not help his pain.  He denies any new trauma or injury to his back.  He has not followed up with orthopedics yet.  On exam the patient is afebrile nontoxic-appearing.  He is able to ambulate with his cane without foot drop.  Sensation is intact his bilateral lower extremities.  Bilateral patellar DTRs are intact.  He is tenderness across his left low back musculature.  No crepitus or deformity.  No concern for cauda equina.  He has ongoing problems with sciatica.  He is completing a course of prednisone. On chart review the patient had an x-ray of his lumbar spine about a week ago that showed multilevel arthropathy with disc space narrowing in the lumbar spine.  He was also prescribed 20 tablets of Percocet on 07/13/2017 by his PCP. Patient will need close follow-up by PCP and visit with orthopedic surgery.  Will prescribe Tylenol and topical diclofenac gel for his pain at this time.  Morphine IM prior to discharge.  Return precautions discussed. I advised the patient to follow-up with their primary care provider this week. I advised the patient to return to the emergency department with new or worsening symptoms or new concerns. The patient verbalized understanding and agreement with plan.      Final Clinical Impressions(s) / ED Diagnoses   Final diagnoses:  Chronic left-sided low back pain with left-sided sciatica    ED Discharge Orders  Ordered     Diclofenac Sodium 3 % GEL  2 times daily     07/26/17 1557    acetaminophen (TYLENOL) 325 MG tablet  Every 6 hours PRN     07/26/17 1557       Waynetta Pean, PA-C 07/26/17 1607    Pattricia Boss, MD 07/27/17 [phone]

## 2017-07-26 NOTE — ED Notes (Signed)
Patient brought back to room in wheelchair.  Patient states "I am here for pain medication" Patient refused to get out of wheelchair into exam chair.

## 2017-07-27 ENCOUNTER — Other Ambulatory Visit: Payer: Medicare Other

## 2017-07-27 ENCOUNTER — Inpatient Hospital Stay: Admission: RE | Admit: 2017-07-27 | Payer: Medicare Other | Source: Ambulatory Visit

## 2017-07-27 ENCOUNTER — Telehealth: Payer: Self-pay | Admitting: Family Medicine

## 2017-07-27 NOTE — Telephone Encounter (Signed)
Tried to call pt to remind them of their appt in the morning 07/28/17  Phone was not taking calls

## 2017-07-28 ENCOUNTER — Ambulatory Visit: Payer: Medicare Other | Admitting: Family Medicine

## 2017-08-02 ENCOUNTER — Ambulatory Visit: Payer: Medicare Other | Admitting: Family Medicine

## 2017-08-03 ENCOUNTER — Institutional Professional Consult (permissible substitution): Payer: Medicare Other | Admitting: Neurology

## 2017-08-03 ENCOUNTER — Telehealth: Payer: Self-pay | Admitting: Neurology

## 2017-08-03 NOTE — Telephone Encounter (Signed)
Pt was a no show to apt today.  

## 2017-08-04 ENCOUNTER — Encounter: Payer: Self-pay | Admitting: Physician Assistant

## 2017-08-04 ENCOUNTER — Ambulatory Visit (INDEPENDENT_AMBULATORY_CARE_PROVIDER_SITE_OTHER): Payer: Medicare Other | Admitting: Physician Assistant

## 2017-08-04 ENCOUNTER — Ambulatory Visit (INDEPENDENT_AMBULATORY_CARE_PROVIDER_SITE_OTHER): Payer: Medicare Other

## 2017-08-04 ENCOUNTER — Encounter: Payer: Self-pay | Admitting: Neurology

## 2017-08-04 VITALS — BP 153/69 | HR 63 | Temp 97.8°F | Resp 16 | Ht 69.0 in | Wt 202.0 lb

## 2017-08-04 DIAGNOSIS — G8929 Other chronic pain: Secondary | ICD-10-CM | POA: Diagnosis not present

## 2017-08-04 DIAGNOSIS — M25552 Pain in left hip: Secondary | ICD-10-CM

## 2017-08-04 DIAGNOSIS — M5416 Radiculopathy, lumbar region: Secondary | ICD-10-CM

## 2017-08-04 DIAGNOSIS — M5136 Other intervertebral disc degeneration, lumbar region: Secondary | ICD-10-CM | POA: Diagnosis not present

## 2017-08-04 MED ORDER — HYDROCODONE-ACETAMINOPHEN 7.5-325 MG PO TABS: 1.0000 | ORAL_TABLET | Freq: Four times a day (QID) | ORAL | 0 refills | Status: DC | PRN

## 2017-08-04 NOTE — Progress Notes (Signed)
PRIMARY CARE AT [facility] [address], [place] [address] [phone]  Date:  08/04/2017   Name:  [name]   DOB:  13-Aug-1950   MRN:  [id number]  PCP:  [name], MD    History of Present Illness:  [name] is a 67 y.o. male patient who presents to PCP with  Chief Complaint  Patient presents with  . Hip Pain    Left. Patient states it is nerve pain/ Unwilling to undress  . Back Pain    chronic sciatica ED visit 07/26/17     Patient is here for hip and back pain.  He is sitting up with his left leg stretched on the table in an L shape.  The pain is at the left side of his hip.  It is constant.  The pain radiates to the lower part of back and goes down to his knee.  No numbness or tingling today, but notes this may happen from time to time.  This is a chronic back pain.  He was seen din the ED for dx chronic sciatica.  Left sided hip and nerve pain.  Pain has been for 1 month.  He denies incontinence--(though he is wearing adult padding).    Patient Active Problem List   Diagnosis Date Noted  . Anemia, iron deficiency   . Gastrointestinal bleeding 10/24/2015  . Absolute anemia   . Tobacco abuse   . Acute ischemic stroke ([place])   . Acute CVA (cerebrovascular accident) ([place]) 05/31/2015  . Stroke ([place]) 05/31/2015  . Dyspnea 04/10/2014  . HTN (hypertension) 04/10/2014  . Inguinal hernia without mention of obstruction or gangrene, unilateral or unspecified, (not specified as recurrent)-right 01/30/2014    Past Medical History:  Diagnosis Date  . Arthritis   . Chronic back pain   . GERD (gastroesophageal reflux disease)    uses Baking Soda  . GIB (gastrointestinal bleeding) 2012  . Glaucoma    right eye  . Headache    occasionally  . Hepatitis C   . History of blood transfusion    no abnormal reaction noted  . History of gout    doesn't take any meds  . Hyperlipidemia    not on any meds  . Hypertension    takes Amlodipine and Lisinopril daily   . Insomnia    takes Ambien nightly  . Ischemic colitis ([place]) 2012  . Joint pain   . Nocturia   . Numbness    both legs occasionally  . PONV (postoperative nausea and vomiting)   . Prostate cancer ([place])   . Shortness of breath dyspnea    rarely but when notices he can be lying/sitting/exertion.Dr.Hochrein is aware per pt  . Stroke ([place]) 2016   . Urinary frequency   . Urinary urgency     Past Surgical History:  Procedure Laterality Date  . APPENDECTOMY    . COLONOSCOPY    . COLONOSCOPY N/A 10/26/2015   Procedure: COLONOSCOPY;  Surgeon: Doran Stabler, MD;  Location: [facility] ENDOSCOPY;  Service: Endoscopy;  Laterality: N/A;  . ESOPHAGOGASTRODUODENOSCOPY    . ESOPHAGOGASTRODUODENOSCOPY N/A 10/26/2015   Procedure: ESOPHAGOGASTRODUODENOSCOPY (EGD);  Surgeon: Doran Stabler, MD;  Location[facility] ENDOSCOPY;  Service: Endoscopy;  Laterality: N/A;  . INGUINAL HERNIA REPAIR Right 11/04/2014   Procedure: RIGHT INGUINAL HERNIA REPAIR WITH MESH;  Surgeon: Jackolyn Confer, MD;  Location: [facility];  Service: General;  Laterality: Right;  . MASS EXCISION N/A 11/04/2014   Procedure:  REMOVAL OF RIGHT GROIN SOFT TISSUE MASS;  Surgeon: Jackolyn Confer, MD;  Location: [facility];  Service: General;  Laterality: N/A;  . Multiple abdominal surgeries     total of 13  . PROSTATECTOMY    . SMALL INTESTINE SURGERY      Social History   Tobacco Use  . Smoking status: Current Every Day Smoker    Packs/day: 0.50    Years: 50.00    Pack years: 25.00    Types: Cigarettes  . Smokeless tobacco: Never Used  Substance Use Topics  . Alcohol use: Yes    Alcohol/week: 0.0 oz    Comment: occasionally  . Drug use: No    Family History  Problem Relation Age of Onset  . Alzheimer's disease Father   . Cancer Mother        Lymph node  . Heart failure Brother 45       Transplant 13 years ago  . CAD Brother   . Heart disease Sister 23       MI    Allergies  Allergen Reactions  . Penicillins Anaphylaxis    Has  patient had a PCN reaction causing immediate rash, facial/tongue/throat swelling, SOB or lightheadedness with hypotension: Yes Has patient had a PCN reaction causing severe rash involving mucus membranes or skin necrosis: Unk Has patient had a PCN reaction that required hospitalization: Unk Has patient had a PCN reaction occurring within the last 10 years: No If all of the above answers are "NO", then may proceed with Cephalosporin use..  . Sudafed [Pseudoephedrine] Other (See Comments)    Heart "races"  . Trazodone And Nefazodone Swelling  . Tomato Rash and Other (See Comments)    Indigestion (also)    Medication list has been reviewed and updated.  Current Outpatient Medications on File Prior to Visit  Medication Sig Dispense Refill  . acetaminophen (TYLENOL) 325 MG tablet Take 2 tablets (650 mg total) by mouth every 6 (six) hours as needed for mild pain or moderate pain. 60 tablet 0  . amLODipine (NORVASC) 5 MG tablet Take 1 tablet (5 mg total) by mouth daily. 90 tablet 1  . Diclofenac Sodium 3 % GEL Place 1 application onto the skin 2 (two) times daily. To affected area. 100 g 0  . hydrochlorothiazide (MICROZIDE) 12.5 MG capsule Take 1 capsule (12.5 mg total) by mouth daily. 90 capsule 1  . HYDROcodone-acetaminophen (NORCO/VICODIN) 5-325 MG tablet Take 1 tablet by mouth every 6 (six) hours as needed for moderate pain. 20 tablet 0  . hydrOXYzine (ATARAX/VISTARIL) 10 MG tablet Take 1-2 tablets (10-20 mg total) by mouth at bedtime as needed. 30 tablet 1  . lisinopril (PRINIVIL,ZESTRIL) 40 MG tablet Take 1 tablet (40 mg total) by mouth daily. 90 tablet 1   No current facility-administered medications on file prior to visit.     ROS ROS otherwise unremarkable unless listed above.  Physical Examination: BP (!) 153/69   Pulse 63   Temp 97.8 F (36.6 C) (Oral)   Resp 16   Ht 5\' 9"  (1.753 m)   Wt 202 lb (91.6 kg)   SpO2 99%   BMI 29.83 kg/m  Ideal Body Weight: Weight in (lb) to  have BMI = 25: 168.9  Physical Exam  Constitutional: He is oriented to person, place, and time. He appears well-developed and well-nourished. No distress.  HENT:  Head: Normocephalic and atraumatic.  Eyes: Conjunctivae and EOM are normal. Pupils are equal, round, and reactive to light.  Cardiovascular:  Normal rate, regular rhythm and intact distal pulses.  No murmur heard. Pulmonary/Chest: Effort normal. No respiratory distress.  Musculoskeletal:       Left hip: He exhibits decreased range of motion (hip abduction.  ), decreased strength (4/5) and tenderness (at the anterior area of the iliac creast).       Lumbar back: He exhibits bony tenderness (at the left si).  Neurological: He is alert and oriented to person, place, and time.  Skin: Skin is warm and dry. He is not diaphoretic.  Psychiatric: He has a normal mood and affect. His behavior is normal.    Dg Lumbar Spine Complete  Result Date: 07/11/2017 CLINICAL DATA:  Lumbago with left-sided sciatica EXAM: LUMBAR SPINE - COMPLETE 4+ VIEW COMPARISON:  None. FINDINGS: Frontal, lateral, spot lumbosacral lateral, and bilateral oblique views were obtained. There are 5 non-rib-bearing lumbar type vertebral bodies. There is no fracture or spondylolisthesis. There is moderately severe disc space narrowing at L5-S1. There is mild disc space narrowing at L4-5. There is mild the space narrowing in the visualized lower thoracic regions. There is anterior osteophytic change in the lower thoracic spine as well as at L1 and L2. There is facet osteoarthritic change at all levels bilaterally in the lumbar region. There is aortic atherosclerosis. IMPRESSION: Multilevel arthropathy. No fracture or spondylolisthesis. There is aortic atherosclerosis. Aortic Atherosclerosis (ICD10-I70.0). Electronically Signed   By: Lowella Grip III M.D.   On: 07/11/2017 11:25   Dg Hip Unilat W Or W/o Pelvis 2-3 Views Left  Result Date: 08/04/2017 CLINICAL DATA:  Left hip  pain to palpation.  No known injury. EXAM: Two view left hip series COMPARISON:  Coronal and sagittal reconstructed images through the pelvis and hips from a CT scan of February 14, 2017 FINDINGS: The bony pelvis is subjectively adequately mineralized. There no acute or old fracture or lytic or blastic lesion. AP and lateral views of the left hip reveal preservation of the joint space. The articular surfaces of the femoral head and acetabulum remains smoothly rounded. The femoral neck, intertrochanteric, and immediate subtrochanteric regions appear normal. IMPRESSION: There is no acute or significant chronic bony abnormality of the left hip. On the previous CT scan subtle lucency was noted through the medial aspect of the femoral head anterior to the fovea. If there are clinical concerns of possible avascular necrosis, MRI would be a useful next imaging step. Electronically Signed   By: Marquinn  Martinique M.D.   On: 08/04/2017 11:32    Assessment and Plan: Katrell Milhorn is a 67 y.o. male who is here today for cc of  Chief Complaint  Patient presents with  . Hip Pain    Left. Patient states it is nerve pain/ Unwilling to undress  . Back Pain    chronic sciatica ED visit 07/26/17  possible avascular necrosis and also has neighboring arthropathy of lumbar.  Advised that we are referring him to ortho.  MRI and other modalities and treatment can be explored at that time.  I am treating him with controlled medication.  He appears here very disgruntled and insistent on medication.  He did not follow up with neurology as recommended.  Urged that this controlled medication will be limited to the follow up with ortho.  He voiced understanding.   Left hip pain - Plan: DG HIP UNILAT W OR W/O PELVIS 2-3 VIEWS LEFT, AMB referral to orthopedics, HYDROcodone-acetaminophen (NORCO) 7.5-325 MG tablet  Narrowing of lumbar intervertebral disc space - Plan: AMB referral to  orthopedics, HYDROcodone-acetaminophen (NORCO) 7.5-325 MG  tablet  Chronic radicular lumbar pain - Plan: AMB referral to orthopedics, HYDROcodone-acetaminophen (NORCO) 7.5-325 MG tablet  Jonathon Drape, PA-C [facility] 1/20/201910:44 AM

## 2017-08-04 NOTE — Patient Instructions (Addendum)
Please await contact for the referral for orthopedist.  In the meantime, you can follow up in 2 weeks, however if your orthopedist appointment is first--you are to go to them  [facility] IS [facility].     IF you received an x-ray today, you will receive an invoice from [facility]. Please contact [facility] at [phone] with questions or concerns regarding your invoice.   IF you received labwork today, you will receive an invoice from [facility]. Please contact [facility] at [phone] with questions or concerns regarding your invoice.   Our billing staff will not be able to assist you with questions regarding bills from these companies.  You will be contacted with the lab results as soon as they are available. The fastest way to get your results is to activate your My Chart account. Instructions are located on the last page of this paperwork. If you have not heard from [place] regarding the results in 2 weeks, please contact this office.

## 2017-08-10 ENCOUNTER — Ambulatory Visit (INDEPENDENT_AMBULATORY_CARE_PROVIDER_SITE_OTHER): Payer: Medicare Other | Admitting: Surgery

## 2017-08-12 ENCOUNTER — Encounter: Payer: Medicare Other | Admitting: Vascular Surgery

## 2017-08-26 ENCOUNTER — Ambulatory Visit (INDEPENDENT_AMBULATORY_CARE_PROVIDER_SITE_OTHER): Payer: Medicare Other | Admitting: Orthopaedic Surgery

## 2017-08-29 ENCOUNTER — Ambulatory Visit (INDEPENDENT_AMBULATORY_CARE_PROVIDER_SITE_OTHER): Payer: Medicare Other | Admitting: Family Medicine

## 2017-08-29 ENCOUNTER — Encounter: Payer: Self-pay | Admitting: Family Medicine

## 2017-08-29 ENCOUNTER — Other Ambulatory Visit: Payer: Self-pay

## 2017-08-29 VITALS — BP 142/72 | HR 80 | Temp 97.9°F | Resp 18 | Ht 69.0 in | Wt 206.2 lb

## 2017-08-29 DIAGNOSIS — M5442 Lumbago with sciatica, left side: Secondary | ICD-10-CM | POA: Diagnosis not present

## 2017-08-29 DIAGNOSIS — G4700 Insomnia, unspecified: Secondary | ICD-10-CM

## 2017-08-29 DIAGNOSIS — M25552 Pain in left hip: Secondary | ICD-10-CM

## 2017-08-29 DIAGNOSIS — Z9119 Patient's noncompliance with other medical treatment and regimen: Secondary | ICD-10-CM

## 2017-08-29 DIAGNOSIS — G8929 Other chronic pain: Secondary | ICD-10-CM

## 2017-08-29 DIAGNOSIS — M5136 Other intervertebral disc degeneration, lumbar region: Secondary | ICD-10-CM

## 2017-08-29 DIAGNOSIS — Z91199 Patient's noncompliance with other medical treatment and regimen due to unspecified reason: Secondary | ICD-10-CM

## 2017-08-29 DIAGNOSIS — M5416 Radiculopathy, lumbar region: Secondary | ICD-10-CM

## 2017-08-29 DIAGNOSIS — I720 Aneurysm of carotid artery: Secondary | ICD-10-CM

## 2017-08-29 MED ORDER — HYDROCODONE-ACETAMINOPHEN 7.5-325 MG PO TABS: 1.0000 | ORAL_TABLET | Freq: Four times a day (QID) | ORAL | 0 refills | Status: DC | PRN

## 2017-08-29 MED ORDER — HYDROXYZINE HCL 25 MG PO TABS: 25.0000 mg | ORAL_TABLET | Freq: Every evening | ORAL | 0 refills | Status: DC | PRN

## 2017-08-29 NOTE — Patient Instructions (Addendum)
You will need to see orthopaedics as soon as possible. Please call them for an appointment today. I will refill the hydrocodone temporarily until you can see the orthopaedic doctor. I will also order MRI for now. If you have new weakness, new loss of urine or stool control, go to the emergency room. I will write a prescription for a wheelchair, but will need to meet with orthopaedist.   Follow up to discuss shoulder issue, or discuss with orthopaedics. Return to the clinic or go to the nearest emergency room if any of your symptoms worsen or new symptoms occur.  You did not show up for your sleep specialist appointment. You will need to reschedule that appointment. Treating pain of back should also help with sleep. Due to allergies, we can not use trazodone. Try new dose of hydroxyzine - 1 to 2 pills at bedtime. Do not combine that with alcohol. Be careful combining that with narcotic medicine. Follow up in next 2 weeks to see how that medicine is working and adjust dose if needed. If any worsening depression symptoms sooner, return here, or emergency room if needed.   Return to the clinic or go to the nearest emergency room if any of your symptoms worsen or new symptoms occur.   [facility]  [phone]   [facility] [phone]  [facility] [phone]       Insomnia Insomnia is a sleep disorder that makes it difficult to fall asleep or to stay asleep. Insomnia can cause tiredness (fatigue), low energy, difficulty concentrating, mood swings, and poor performance at work or school. There are three different ways to classify insomnia:  Difficulty falling asleep.  Difficulty staying asleep.  Waking up too early in the morning.  Any type of insomnia can be long-term (chronic) or short-term (acute). Both are common. Short-term insomnia usually lasts for three months or less. Chronic insomnia occurs at least three times a week for longer than three  months. What are the causes? Insomnia may be caused by another condition, situation, or substance, such as:  Anxiety.  Certain medicines.  Gastroesophageal reflux disease (GERD) or other gastrointestinal conditions.  Asthma or other breathing conditions.  Restless legs syndrome, sleep apnea, or other sleep disorders.  Chronic pain.  Menopause. This may include hot flashes.  Stroke.  Abuse of alcohol, tobacco, or illegal drugs.  Depression.  Caffeine.  Neurological disorders, such as Alzheimer disease.  An overactive thyroid (hyperthyroidism).  The cause of insomnia may not be known. What increases the risk? Risk factors for insomnia include:  Gender. Women are more commonly affected than men.  Age. Insomnia is more common as you get older.  Stress. This may involve your professional or personal life.  Income. Insomnia is more common in people with lower income.  Lack of exercise.  Irregular work schedule or night shifts.  Traveling between different time zones.  What are the signs or symptoms? If you have insomnia, trouble falling asleep or trouble staying asleep is the main symptom. This may lead to other symptoms, such as:  Feeling fatigued.  Feeling nervous about going to sleep.  Not feeling rested in the morning.  Having trouble concentrating.  Feeling irritable, anxious, or depressed.  How is this treated? Treatment for insomnia depends on the cause. If your insomnia is caused by an underlying condition, treatment will focus on addressing the condition. Treatment may also include:  Medicines to help you sleep.  Counseling or therapy.  Lifestyle adjustments.  Follow these  instructions at home:  Take medicines only as directed by your health care provider.  Keep regular sleeping and waking hours. Avoid naps.  Keep a sleep diary to help you and your health care provider figure out what could be causing your insomnia. Include: ? When you  sleep. ? When you wake up during the night. ? How well you sleep. ? How rested you feel the next day. ? Any side effects of medicines you are taking. ? What you eat and drink.  Make your bedroom a comfortable place where it is easy to fall asleep: ? Put up shades or special blackout curtains to block light from outside. ? Use a white noise machine to block noise. ? Keep the temperature cool.  Exercise regularly as directed by your health care provider. Avoid exercising right before bedtime.  Use relaxation techniques to manage stress. Ask your health care provider to suggest some techniques that may work well for you. These may include: ? Breathing exercises. ? Routines to release muscle tension. ? Visualizing peaceful scenes.  Cut back on alcohol, caffeinated beverages, and cigarettes, especially close to bedtime. These can disrupt your sleep.  Do not overeat or eat spicy foods right before bedtime. This can lead to digestive discomfort that can make it hard for you to sleep.  Limit screen use before bedtime. This includes: ? Watching TV. ? Using your smartphone, tablet, and computer.  Stick to a routine. This can help you fall asleep faster. Try to do a quiet activity, brush your teeth, and go to bed at the same time each night.  Get out of bed if you are still awake after 15 minutes of trying to sleep. Keep the lights down, but try reading or doing a quiet activity. When you feel sleepy, go back to bed.  Make sure that you drive carefully. Avoid driving if you feel very sleepy.  Keep all follow-up appointments as directed by your health care provider. This is important. Contact a health care provider if:  You are tired throughout the day or have trouble in your daily routine due to sleepiness.  You continue to have sleep problems or your sleep problems get worse. Get help right away if:  You have serious thoughts about hurting yourself or someone else. This information is  not intended to replace advice given to you by your health care provider. Make sure you discuss any questions you have with your health care provider. Document Released: 07/02/2000 Document Revised: 12/05/2015 Document Reviewed: 04/05/2014 Elsevier Interactive Patient Education  2018 [employer].   IF you received an x-ray today, you will receive an invoice from [facility]. Please contact [facility] at [phone] with questions or concerns regarding your invoice.   IF you received labwork today, you will receive an invoice from [facility]. Please contact [facility] at [phone] with questions or concerns regarding your invoice.   Our billing staff will not be able to assist you with questions regarding bills from these companies.  You will be contacted with the lab results as soon as they are available. The fastest way to get your results is to activate your My Chart account. Instructions are located on the last page of this paperwork. If you have not heard from [place] regarding the results in 2 weeks, please contact this office.

## 2017-08-29 NOTE — Progress Notes (Signed)
Subjective:  By signing my name below, I, [name], attest that this documentation has been prepared under the direction and in the presence of Jonathon Agreste, MD Electronically Signed: Ladene Artist, ED Scribe 08/29/2017 at 9:16 AM.   Patient ID: [name], male    DOB: May 24, 1951, 67 y.o.   MRN: [id number]  Chief Complaint  Patient presents with  . Hip Pain    never pain pt wants a wheelchair   . Insomnia  . Depression    screening was a 17    HPI [name] is a 67 y.o. male who presents to Primary Care at [facility] for multiple concerns.  Hip Pain Last seen by Jonathon Drape, PA-C in Jan. Chronic back pain and L-sided hip with nerve pain present x 1 month. Poss avascular narcosis of hip. Referred to ortho and short course of hydrocodone until he was able to see them. Prescribed #56 tabs of Norco 7.5 mg on 1/17 to be taken every 6 hrs prn. Appears on 1/18 there was a message left with pt to return call, schedule with ortho. Tried Prednisone for sciatica in Dec as slight improvement when treated in July. Telephone note 1/2 advised need for ortho appointment or MRI. Treated with topical diclofenac at that time and tylenol and IM morphine in ER. --- Pt states that he no showed the ortho appointment since his insurance just got reinstated and he did not have transportation. He reports pain still radiates from his back into his L hip, L buttock and L hip numbness. Reports he only has 3 Norco tabs remaining as he was taking 2 tabs/day, now 1, which makes the pain tolerable. No recent falls. Denies fever, bladder/bowel incontinence, saddle paresthesia. Denies recent addiction to pain meds.  Carotid Artery Aneurysms See prior notes. Discussed with vascular surgery. --- Does report neck cramping but denies pain.  Insomnia We discussed chronic worsening insomnia in Dec. Short term hydroxyzine was prescribed while waiting for sleep specialist eval. He was a no show for his  appointment 1/16. --- Pt reports difficulty falling asleep for almost a yr. States he is only sleeping ~1 hour/night as he states hydroxyzine has not helped. He has been using marijuana to assist with sleep. Does report some depression as well. Pt drinks 1 can beer/day. Denies SI/HI, cocaine use.  Depression screen [facility] 2/9 08/29/2017 07/11/2017 06/23/2017 05/27/2016 12/08/2015  Decreased Interest 1 0 0 0 0  Down, Depressed, Hopeless 1 0 0 0 0  PHQ - 2 Score 2 0 0 0 0  Altered sleeping 3 - - - 0  Tired, decreased energy 2 - - - 0  Change in appetite 3 - - - 0  Feeling bad or failure about yourself  1 - - - 0  Trouble concentrating 3 - - - 0  Moving slowly or fidgety/restless 3 - - - 0  Suicidal thoughts 0 - - - 0  PHQ-9 Score 17 - - - 0  Difficult doing work/chores - - - - Not difficult at all   Pt also requests a prescription for a motorized wheelchair. States he is unable to use a manual wheelchair as he is experiencing L shoulder pain/numbness which he plans to f/u for. Denies falls/injury.  Patient Active Problem List   Diagnosis Date Noted  . Anemia, iron deficiency   . Gastrointestinal bleeding 10/24/2015  . Absolute anemia   . Tobacco abuse   . Acute ischemic stroke ([place])   . Acute CVA (  cerebrovascular accident) ([place]) 05/31/2015  . Stroke ([place]) 05/31/2015  . Dyspnea 04/10/2014  . HTN (hypertension) 04/10/2014  . Inguinal hernia without mention of obstruction or gangrene, unilateral or unspecified, (not specified as recurrent)-right 01/30/2014   Past Medical History:  Diagnosis Date  . Arthritis   . Chronic back pain   . GERD (gastroesophageal reflux disease)    uses Baking Soda  . GIB (gastrointestinal bleeding) 2012  . Glaucoma    right eye  . Headache    occasionally  . Hepatitis C   . History of blood transfusion    no abnormal reaction noted  . History of gout    doesn't take any meds  . Hyperlipidemia    not on any meds  . Hypertension    takes Amlodipine and  Lisinopril daily  . Insomnia    takes Ambien nightly  . Ischemic colitis ([place]) 2012  . Joint pain   . Nocturia   . Numbness    both legs occasionally  . PONV (postoperative nausea and vomiting)   . Prostate cancer ([place])   . Shortness of breath dyspnea    rarely but when notices he can be lying/sitting/exertion.Dr.Hochrein is aware per pt  . Stroke ([place]) 2016   . Urinary frequency   . Urinary urgency    Past Surgical History:  Procedure Laterality Date  . APPENDECTOMY    . COLONOSCOPY    . COLONOSCOPY N/A 10/26/2015   Procedure: COLONOSCOPY;  Surgeon: Doran Stabler, MD;  Location: [facility] ENDOSCOPY;  Service: Endoscopy;  Laterality: N/A;  . ESOPHAGOGASTRODUODENOSCOPY    . ESOPHAGOGASTRODUODENOSCOPY N/A 10/26/2015   Procedure: ESOPHAGOGASTRODUODENOSCOPY (EGD);  Surgeon: Doran Stabler, MD;  Location: [facility] ENDOSCOPY;  Service: Endoscopy;  Laterality: N/A;  . INGUINAL HERNIA REPAIR Right 11/04/2014   Procedure: RIGHT INGUINAL HERNIA REPAIR WITH MESH;  Surgeon: Jackolyn Confer, MD;  Location: [place];  Service: General;  Laterality: Right;  . MASS EXCISION N/A 11/04/2014   Procedure: REMOVAL OF RIGHT GROIN SOFT TISSUE MASS;  Surgeon: Jackolyn Confer, MD;  Location: [place];  Service: General;  Laterality: N/A;  . Multiple abdominal surgeries     total of 13  . PROSTATECTOMY    . SMALL INTESTINE SURGERY     Allergies  Allergen Reactions  . Penicillins Anaphylaxis    Has patient had a PCN reaction causing immediate rash, facial/tongue/throat swelling, SOB or lightheadedness with hypotension: Yes Has patient had a PCN reaction causing severe rash involving mucus membranes or skin necrosis: Unk Has patient had a PCN reaction that required hospitalization: Unk Has patient had a PCN reaction occurring within the last 10 years: No If all of the above answers are "NO", then may proceed with Cephalosporin use..  . Sudafed [Pseudoephedrine] Other (See Comments)    Heart "races"  . Trazodone And  Nefazodone Swelling   Prior to Admission medications   Medication Sig Start Date End Date Taking? Authorizing Provider  amLODipine (NORVASC) 5 MG tablet Take 1 tablet (5 mg total) by mouth daily. 07/11/17  Yes Jonathon Agreste, MD  HYDROcodone-acetaminophen (NORCO) 7.5-325 MG tablet Take 1 tablet by mouth every 6 (six) hours as needed for moderate pain. For chronic pain 08/04/17  Yes English, Stephanie D, PA  lisinopril (PRINIVIL,ZESTRIL) 40 MG tablet Take 1 tablet (40 mg total) by mouth daily. 06/23/17  Yes Jonathon Agreste, MD  acetaminophen (TYLENOL) 325 MG tablet Take 2 tablets (650 mg total) by mouth every 6 (six) hours as needed for  mild pain or moderate pain. Patient not taking: Reported on 08/29/2017 07/26/17   Waynetta Pean, PA-C  Diclofenac Sodium 3 % GEL Place 1 application onto the skin 2 (two) times daily. To affected area. Patient not taking: Reported on 08/29/2017 07/26/17   Waynetta Pean, PA-C  hydrochlorothiazide (MICROZIDE) 12.5 MG capsule Take 1 capsule (12.5 mg total) by mouth daily. Patient not taking: Reported on 08/29/2017 06/23/17   Jonathon Agreste, MD  hydrOXYzine (ATARAX/VISTARIL) 10 MG tablet Take 1-2 tablets (10-20 mg total) by mouth at bedtime as needed. Patient not taking: Reported on 08/29/2017 06/23/17   Jonathon Agreste, MD   Social History   Socioeconomic History  . Marital status: Legally Separated    Spouse name: Not on file  . Number of children: 2  . Years of education: Not on file  . Highest education level: Not on file  Social Needs  . Financial resource strain: Not on file  . Food insecurity - worry: Not on file  . Food insecurity - inability: Not on file  . Transportation needs - medical: Not on file  . Transportation needs - non-medical: Not on file  Occupational History  . Not on file  Tobacco Use  . Smoking status: Current Every Day Smoker    Packs/day: 0.50    Years: 50.00    Pack years: 25.00    Types: Cigarettes  . Smokeless tobacco:  Never Used  Substance and Sexual Activity  . Alcohol use: Yes    Alcohol/week: 0.0 oz    Comment: occasionally  . Drug use: No  . Sexual activity: Yes  Other Topics Concern  . Not on file  Social History Narrative   One living child .  One murdered.  Lives with girlfriend.     Review of Systems  Constitutional: Negative for fever.  Genitourinary: Negative for enuresis.  Musculoskeletal: Positive for arthralgias and back pain. Negative for neck pain.  Neurological: Positive for numbness.  Psychiatric/Behavioral: Positive for dysphoric mood and sleep disturbance. Negative for suicidal ideas.      Objective:   Physical Exam  Constitutional: He is oriented to person, place, and time. He appears well-developed and well-nourished. No distress.  HENT:  Head: Normocephalic and atraumatic.  Eyes: Conjunctivae and EOM are normal. Pupils are equal, round, and reactive to light.  Neck: Neck supple. No JVD present. Carotid bruit is not present. No tracheal deviation present.  Pulsatile masses R greater than L neck with bruit R greater than L.  Cardiovascular: Normal rate, regular rhythm and normal heart sounds.  No murmur heard. Pulmonary/Chest: Effort normal and breath sounds normal. No respiratory distress. He has no rales.  Musculoskeletal: Normal range of motion. He exhibits no edema.  Tight sensation with straight leg raise on the L. Pain with internal rotation of L hip seated. Tender on L lumbar paraspinals into L sciatic notch.  Neurological: He is alert and oriented to person, place, and time.  Reflex Scores:      Patellar reflexes are 2+ on the right side and 2+ on the left side.      Achilles reflexes are 2+ on the right side and 2+ on the left side. Skin: Skin is warm and dry.  Psychiatric: He has a normal mood and affect. His behavior is normal.  Nursing note and vitals reviewed.  Vitals:   08/29/17 0829  BP: (!) 142/72  Pulse: 80  Resp: 18  Temp: 97.9 F (36.6 C)    TempSrc: Oral  SpO2:  99%  Weight: 206 lb 3.2 oz (93.5 kg)  Height: 5' 9"  (1.753 m)      Assessment & Plan:   Demetrice Combes is a 67 y.o. male Insomnia, unspecified type  - Long-standing issue. Possibly multifactorial with chronic pain, prior snoring, may have some component of sleep apnea. Had referred previously to sleep specialist but did not attend. Some difficulty with prior insurance lapse and transportation issues.  -Referral placed to connected care team to see if there are assistance options provided for transportation and to evaluate his current living situation. Paperwork also provided for [employer] bus system to ease transportation for appointments - I'm happy to complete that paperwork if needed.  -Trial of higher dose of hydroxyzine 25-50 mg daily at bedtime when necessary. Initially thought trazodone may be beneficial but he has allergy to the medication. With current narcotic use will avoid benzodiazepine at this time. Recheck in the next 1-2 weeks   Narrowing of lumbar intervertebral disc space - Plan: HYDROcodone-acetaminophen (NORCO) 7.5-325 MG tablet Chronic radicular lumbar pain - Plan: HYDROcodone-acetaminophen (NORCO) 7.5-325 MG tablet Left hip pain - Plan: Ambulatory referral to Connected Care, HYDROcodone-acetaminophen ([place]) 7.5-325 MG tablet Left-sided low back pain with left-sided sciatica, unspecified chronicity - Plan: MR Lumbar Spine Wo Contrast, Ambulatory referral to Connected Care  -Primarily appears to be sciatica, possible disc herniation/protrusion. Failed previous treatments with prednisone.   -Significantly limiting his mobility, did write for wheelchair with paper prescription. He can have paperwork sent from motorized wheelchair company, but that may need to be discussed with orthopedics. Ultimately he needs further evaluation with MRI and orthopedic eval as previously referred. I agreed to order MRI today, but still needs orthopedic eval.  -Hydrocodone  refill temporarily until follow-up appointment - lowest effective dose, side effects discussed.  -ER precautions if any sign/symptoms of acute worsening. [place] precautions discussed  Carotid aneurysm, right ([place]) Carotid aneurysm, left ([place])  -See prior visits. Has not met with vascular surgeon, referral was placed previously. Will provide phone number given for him to schedule.  History of nonadherence to medical treatment  -Presents with daughter today who may be helpful in making sure he gets appointments scheduled and to those appointments. Insurance lapse was factor previously.   Meds ordered this encounter  Medications  . HYDROcodone-acetaminophen (NORCO) 7.5-325 MG tablet    Sig: Take 1 tablet by mouth every 6 (six) hours as needed for moderate pain. For chronic pain    Dispense:  30 tablet    Refill:  0  . hydrOXYzine (ATARAX/VISTARIL) 25 MG tablet    Sig: Take 1-2 tablets (25-50 mg total) by mouth at bedtime as needed (sleep).    Dispense:  30 tablet    Refill:  0   Patient Instructions    You will need to see orthopaedics as soon as possible. Please call them for an appointment today. I will refill the hydrocodone temporarily until you can see the orthopaedic doctor. I will also order MRI for now. If you have new weakness, new loss of urine or stool control, go to the emergency room. I will write a prescription for a wheelchair, but will need to meet with orthopaedist.   Follow up to discuss shoulder issue, or discuss with orthopaedics. Return to the clinic or go to the nearest emergency room if any of your symptoms worsen or new symptoms occur.  You did not show up for your sleep specialist appointment. You will need to reschedule that appointment. Treating pain of  back should also help with sleep. Due to allergies, we can not use trazodone. Try new dose of hydroxyzine - 1 to 2 pills at bedtime. Do not combine that with alcohol. Be careful combining that with narcotic  medicine. Follow up in next 2 weeks to see how that medicine is working and adjust dose if needed. If any worsening depression symptoms sooner, return here, or emergency room if needed.   Return to the clinic or go to the nearest emergency room if any of your symptoms worsen or new symptoms occur.   [facility]  [phone]   [place] [facility] [phone]  [facility] [phone]       Insomnia Insomnia is a sleep disorder that makes it difficult to fall asleep or to stay asleep. Insomnia can cause tiredness (fatigue), low energy, difficulty concentrating, mood swings, and poor performance at work or school. There are three different ways to classify insomnia:  Difficulty falling asleep.  Difficulty staying asleep.  Waking up too early in the morning.  Any type of insomnia can be long-term (chronic) or short-term (acute). Both are common. Short-term insomnia usually lasts for three months or less. Chronic insomnia occurs at least three times a week for longer than three months. What are the causes? Insomnia may be caused by another condition, situation, or substance, such as:  Anxiety.  Certain medicines.  Gastroesophageal reflux disease (GERD) or other gastrointestinal conditions.  Asthma or other breathing conditions.  Restless legs syndrome, sleep apnea, or other sleep disorders.  Chronic pain.  Menopause. This may include hot flashes.  Stroke.  Abuse of alcohol, tobacco, or illegal drugs.  Depression.  Caffeine.  Neurological disorders, such as Alzheimer disease.  An overactive thyroid (hyperthyroidism).  The cause of insomnia may not be known. What increases the risk? Risk factors for insomnia include:  Gender. Women are more commonly affected than men.  Age. Insomnia is more common as you get older.  Stress. This may involve your professional or personal life.  Income. Insomnia is more common in people with lower  income.  Lack of exercise.  Irregular work schedule or night shifts.  Traveling between different time zones.  What are the signs or symptoms? If you have insomnia, trouble falling asleep or trouble staying asleep is the main symptom. This may lead to other symptoms, such as:  Feeling fatigued.  Feeling nervous about going to sleep.  Not feeling rested in the morning.  Having trouble concentrating.  Feeling irritable, anxious, or depressed.  How is this treated? Treatment for insomnia depends on the cause. If your insomnia is caused by an underlying condition, treatment will focus on addressing the condition. Treatment may also include:  Medicines to help you sleep.  Counseling or therapy.  Lifestyle adjustments.  Follow these instructions at home:  Take medicines only as directed by your health care provider.  Keep regular sleeping and waking hours. Avoid naps.  Keep a sleep diary to help you and your health care provider figure out what could be causing your insomnia. Include: ? When you sleep. ? When you wake up during the night. ? How well you sleep. ? How rested you feel the next day. ? Any side effects of medicines you are taking. ? What you eat and drink.  Make your bedroom a comfortable place where it is easy to fall asleep: ? Put up shades or special blackout curtains to block light from outside. ? Use a white noise machine to block noise. ?  Keep the temperature cool.  Exercise regularly as directed by your health care provider. Avoid exercising right before bedtime.  Use relaxation techniques to manage stress. Ask your health care provider to suggest some techniques that may work well for you. These may include: ? Breathing exercises. ? Routines to release muscle tension. ? Visualizing peaceful scenes.  Cut back on alcohol, caffeinated beverages, and cigarettes, especially close to bedtime. These can disrupt your sleep.  Do not overeat or eat spicy  foods right before bedtime. This can lead to digestive discomfort that can make it hard for you to sleep.  Limit screen use before bedtime. This includes: ? Watching TV. ? Using your smartphone, tablet, and computer.  Stick to a routine. This can help you fall asleep faster. Try to do a quiet activity, brush your teeth, and go to bed at the same time each night.  Get out of bed if you are still awake after 15 minutes of trying to sleep. Keep the lights down, but try reading or doing a quiet activity. When you feel sleepy, go back to bed.  Make sure that you drive carefully. Avoid driving if you feel very sleepy.  Keep all follow-up appointments as directed by your health care provider. This is important. Contact a health care provider if:  You are tired throughout the day or have trouble in your daily routine due to sleepiness.  You continue to have sleep problems or your sleep problems get worse. Get help right away if:  You have serious thoughts about hurting yourself or someone else. This information is not intended to replace advice given to you by your health care provider. Make sure you discuss any questions you have with your health care provider. Document Released: 07/02/2000 Document Revised: 12/05/2015 Document Reviewed: 04/05/2014 Elsevier Interactive Patient Education  2018 [employer].   IF you received an x-ray today, you will receive an invoice from [facility]. Please contact [facility] at [phone] with questions or concerns regarding your invoice.   IF you received labwork today, you will receive an invoice from [facility]. Please contact [facility] at [phone] with questions or concerns regarding your invoice.   Our billing staff will not be able to assist you with questions regarding bills from these companies.  You will be contacted with the lab results as soon as they are available. The fastest way to get your results is to activate your  My Chart account. Instructions are located on the last page of this paperwork. If you have not heard from [place] regarding the results in 2 weeks, please contact this office.     I personally performed the services described in this documentation, which was scribed in my presence. The recorded information has been reviewed and considered for accuracy and completeness, addended by me as needed, and agree with information above.  Signed,   Merri Ray, MD [facility].  08/29/17 11:07 AM

## 2017-09-01 ENCOUNTER — Ambulatory Visit (INDEPENDENT_AMBULATORY_CARE_PROVIDER_SITE_OTHER): Payer: Medicaid Other | Admitting: Orthopaedic Surgery

## 2017-09-01 ENCOUNTER — Ambulatory Visit (INDEPENDENT_AMBULATORY_CARE_PROVIDER_SITE_OTHER): Payer: Medicare Other

## 2017-09-01 ENCOUNTER — Telehealth: Payer: Self-pay

## 2017-09-01 DIAGNOSIS — M25552 Pain in left hip: Secondary | ICD-10-CM | POA: Diagnosis not present

## 2017-09-01 MED ORDER — BUPIVACAINE HCL 0.5 % IJ SOLN
3.0000 mL | INTRAMUSCULAR | Status: AC | PRN
  Administered 2017-09-01: 3 mL via INTRA_ARTICULAR

## 2017-09-01 MED ORDER — TRIAMCINOLONE ACETONIDE 40 MG/ML IJ SUSP
80.0000 mg | INTRAMUSCULAR | Status: AC | PRN
  Administered 2017-09-01: 80 mg via INTRA_ARTICULAR

## 2017-09-01 NOTE — Progress Notes (Signed)
[name] [facility] - 67 y.o. male MRN [id number]  Date of birth: Nov 22, 1950  Office Visit Note: Visit Date: 09/01/2017 PCP: [name], MD Referred by: [name], MD  Subjective: Chief Complaint  Patient presents with  . 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 today for evaluation of [name] requested work in fluoroscopically guided anesthetic hip arthrogram on the left.    ROS Otherwise per HPI.  Assessment & Plan: Visit Diagnoses:  1. Pain of left hip joint     Plan: Findings:  Diagnostic and hopefully therapeutic left intra-articular anesthetic hip arthrogram.  Patient did seem to have some relief during the anesthetic phase of the injection.  Somewhat of a poor historian.    Meds & Orders: No orders of the defined types were placed in this encounter.  No orders of the defined types were placed in this encounter.   Follow-up: Return in about 2 weeks (around 09/15/2017).   Procedures: Large Joint Inj: L hip joint on 09/01/2017 10:17 AM Indications: pain and diagnostic evaluation Details: 22 G needle, anterior approach  Arthrogram: Yes  Medications: 3 mL bupivacaine 0.5 %; 80 mg triamcinolone acetonide 40 MG/ML Outcome: tolerated well, no immediate complications  Arthrogram demonstrated excellent flow of contrast throughout the joint surface without extravasation or obvious defect.  The patient seemed to have some relief of symptoms during the anesthetic phase of the injection.  Procedure, treatment alternatives, risks and benefits explained, specific risks discussed. Consent was given by the patient. Immediately prior to procedure a time out was called to verify the correct patient, procedure, equipment, support staff and site/side marked as required. Patient was prepped and draped in the usual sterile fashion.      No notes on file   Clinical History: No specialty comments available.  He reports that he has been  smoking cigarettes.  He has a 25.00 pack-year smoking history. he has never used smokeless tobacco. No results for input(s): HGBA1C, LABURIC in the last 8760 hours.  Objective:  VS:  HT:    WT:   BMI:     BP:   HR: bpm  TEMP: ( )  RESP:  Physical Exam  Ortho Exam Imaging: No results found.  Past Medical/Family/Surgical/Social History: Medications & Allergies reviewed per EMR Patient Active Problem List   Diagnosis Date Noted  . Anemia, iron deficiency   . Gastrointestinal bleeding 10/24/2015  . Absolute anemia   . Tobacco abuse   . Acute ischemic stroke ([place])   . Acute CVA (cerebrovascular accident) ([place]) 05/31/2015  . Stroke ([place]) 05/31/2015  . Dyspnea 04/10/2014  . HTN (hypertension) 04/10/2014  . Inguinal hernia without mention of obstruction or gangrene, unilateral or unspecified, (not specified as recurrent)-right 01/30/2014   Past Medical History:  Diagnosis Date  . Arthritis   . Chronic back pain   . GERD (gastroesophageal reflux disease)    uses Baking Soda  . GIB (gastrointestinal bleeding) 2012  . Glaucoma    right eye  . Headache    occasionally  . Hepatitis C   . History of blood transfusion    no abnormal reaction noted  . History of gout    doesn't take any meds  . Hyperlipidemia    not on any meds  . Hypertension    takes Amlodipine and Lisinopril daily  . Insomnia    takes Ambien nightly  . Ischemic colitis ([place]) 2012  . Joint pain   .  Nocturia   . Numbness    both legs occasionally  . PONV (postoperative nausea and vomiting)   . Prostate cancer ([place])   . Shortness of breath dyspnea    rarely but when notices he can be lying/sitting/exertion.Dr.Hochrein is aware per pt  . Stroke ([place]) 2016   . Urinary frequency   . Urinary urgency    Family History  Problem Relation Age of Onset  . Alzheimer's disease Father   . Cancer Mother        Lymph node  . Heart failure Brother 45       Transplant 13 years ago  . CAD Brother   . Heart  disease Sister 61       MI   Past Surgical History:  Procedure Laterality Date  . APPENDECTOMY    . COLONOSCOPY    . COLONOSCOPY N/A 10/26/2015   Procedure: COLONOSCOPY;  Surgeon: Doran Stabler, MD;  [facility] ENDOSCOPY;  Service: Endoscopy;  Laterality: N/A;  . ESOPHAGOGASTRODUODENOSCOPY    . ESOPHAGOGASTRODUODENOSCOPY N/A 10/26/2015   Procedure: ESOPHAGOGASTRODUODENOSCOPY (EGD);  Surgeon: Doran Stabler, MD;  Location: [facility] ENDOSCOPY;  Service: Endoscopy;  Laterality: N/A;  . INGUINAL HERNIA REPAIR Right 11/04/2014   Procedure: RIGHT INGUINAL HERNIA REPAIR WITH MESH;  Surgeon: Jackolyn Confer, MD;  Location: [place];  Service: General;  Laterality: Right;  . MASS EXCISION N/A 11/04/2014   Procedure: REMOVAL OF RIGHT GROIN SOFT TISSUE MASS;  Surgeon: Jackolyn Confer, MD;  Location: [place];  Service: General;  Laterality: N/A;  . Multiple abdominal surgeries     total of 13  . PROSTATECTOMY    . SMALL INTESTINE SURGERY     Social History   Occupational History  . Not on file  Tobacco Use  . Smoking status: Current Every Day Smoker    Packs/day: 0.50    Years: 50.00    Pack years: 25.00    Types: Cigarettes  . Smokeless tobacco: Never Used  Substance and Sexual Activity  . Alcohol use: Yes    Alcohol/week: 0.0 oz    Comment: occasionally  . Drug use: No  . Sexual activity: Yes

## 2017-09-01 NOTE — Progress Notes (Signed)
Office Visit Note   Patient: [name]           Date of Birth: 1951-03-25           MRN: [id number] Visit Date: 09/01/2017              Requested by: [name], MD [address] [place], [place] [address] PCP: [name], MD   Assessment & Plan: Visit Diagnoses:  1. Pain of left hip joint     Plan: Impression is 67 year old gentleman with left hip pain likely a combination of hip arthritis and lumbar spondylosis.  He did get good relief during the anesthetic phase with left hip injection today.  Follow-up in 2 weeks for recheck.  Prescription for walker as his current one is broken and unsafe for ambulation.  Follow-Up Instructions: Return in about 2 weeks (around 09/15/2017).   Orders:  Orders Placed This Encounter  Procedures  . Large Joint Inj: L hip joint  . XR C-ARM NO REPORT   No orders of the defined types were placed in this encounter.     Procedures: No procedures performed   Clinical Data: No additional findings.   Subjective: Chief Complaint  Patient presents with  . Left Hip - Pain    Patient is a 67 year old gentleman who comes in with chronic left hip pain for 4-6 months.  He is ambulating with a walker secondary to the pain.  He has significant difficulty with ADLs and chronic night pain.  He is not able to sleep because of this.  He takes hydrocodone.  He endorses pain in his left hip region that radiates down to the anterior thigh and knee.  Denies any radicular symptoms.  Denies any numbness and tingling.    Review of Systems  Constitutional: Negative.   All other systems reviewed and are negative.    Objective: Vital Signs: There were no vitals taken for this visit.  Physical Exam  Constitutional: He is oriented to person, place, and time. He appears well-developed and well-nourished.  HENT:  Head: Normocephalic and atraumatic.  Eyes: Pupils are equal, round, and reactive to light.  Neck: Neck supple.    Pulmonary/Chest: Effort normal.  Abdominal: Soft.  Musculoskeletal: Normal range of motion.  Neurological: He is alert and oriented to person, place, and time.  Skin: Skin is warm.  Psychiatric: He has a normal mood and affect. His behavior is normal. Judgment and thought content normal.  Nursing note and vitals reviewed.   Ortho Exam Left hip exam shows painful range of motion.  He is unable to perform Stinchfield test.  Negative straight leg.  Trochanteric bursa is not overly tender.  He does have pain with palpation in his lumbar region. Specialty Comments:  No specialty comments available.  Imaging: No results found.   PMFS History: Patient Active Problem List   Diagnosis Date Noted  . Anemia, iron deficiency   . Gastrointestinal bleeding 10/24/2015  . Absolute anemia   . Tobacco abuse   . Acute ischemic stroke ([place])   . Acute CVA (cerebrovascular accident) ([place]) 05/31/2015  . Stroke ([place]) 05/31/2015  . Dyspnea 04/10/2014  . HTN (hypertension) 04/10/2014  . Inguinal hernia without mention of obstruction or gangrene, unilateral or unspecified, (not specified as recurrent)-right 01/30/2014   Past Medical History:  Diagnosis Date  . Arthritis   . Chronic back pain   . GERD (gastroesophageal reflux disease)    uses Baking Soda  . GIB (gastrointestinal bleeding) 2012  .  Glaucoma    right eye  . Headache    occasionally  . Hepatitis C   . History of blood transfusion    no abnormal reaction noted  . History of gout    doesn't take any meds  . Hyperlipidemia    not on any meds  . Hypertension    takes Amlodipine and Lisinopril daily  . Insomnia    takes Ambien nightly  . Ischemic colitis ([place]) 2012  . Joint pain   . Nocturia   . Numbness    both legs occasionally  . PONV (postoperative nausea and vomiting)   . Prostate cancer ([place])   . Shortness of breath dyspnea    rarely but when notices he can be lying/sitting/exertion.Dr.Hochrein is aware per pt  .  Stroke ([place]) 2016   . Urinary frequency   . Urinary urgency     Family History  Problem Relation Age of Onset  . Alzheimer's disease Father   . Cancer Mother        Lymph node  . Heart failure Brother 45       Transplant 13 years ago  . CAD Brother   . Heart disease Sister 17       MI    Past Surgical History:  Procedure Laterality Date  . APPENDECTOMY    . COLONOSCOPY    . COLONOSCOPY N/A 10/26/2015   Procedure: COLONOSCOPY;  Surgeon: Doran Stabler, MD;  Location: [facility] ENDOSCOPY;  Service: Endoscopy;  Laterality: N/A;  . ESOPHAGOGASTRODUODENOSCOPY    . ESOPHAGOGASTRODUODENOSCOPY N/A 10/26/2015   Procedure: ESOPHAGOGASTRODUODENOSCOPY (EGD);  Surgeon: Doran Stabler, MD;  Location: [facility] ENDOSCOPY;  Service: Endoscopy;  Laterality: N/A;  . INGUINAL HERNIA REPAIR Right 11/04/2014   Procedure: RIGHT INGUINAL HERNIA REPAIR WITH MESH;  Surgeon: Jackolyn Confer, MD;  Location: [place];  Service: General;  Laterality: Right;  . MASS EXCISION N/A 11/04/2014   Procedure: REMOVAL OF RIGHT GROIN SOFT TISSUE MASS;  Surgeon: Jackolyn Confer, MD;  Location: [place];  Service: General;  Laterality: N/A;  . Multiple abdominal surgeries     total of 13  . PROSTATECTOMY    . SMALL INTESTINE SURGERY     Social History   Occupational History  . Not on file  Tobacco Use  . Smoking status: Current Every Day Smoker    Packs/day: 0.50    Years: 50.00    Pack years: 25.00    Types: Cigarettes  . Smokeless tobacco: Never Used  Substance and Sexual Activity  . Alcohol use: Yes    Alcohol/week: 0.0 oz    Comment: occasionally  . Drug use: No  . Sexual activity: Yes

## 2017-09-01 NOTE — Telephone Encounter (Signed)
Called pt to discuss community resource request received from Dr. Carlota Raspberry. Pt was in another appt and will call me back.    [name], B.A.  Care Guide - [facility] at [place]

## 2017-09-12 ENCOUNTER — Encounter: Payer: Medicare Other | Admitting: Surgery

## 2017-09-12 ENCOUNTER — Ambulatory Visit: Payer: Medicare Other | Admitting: Family Medicine

## 2017-09-13 ENCOUNTER — Encounter: Payer: Self-pay | Admitting: Family Medicine

## 2017-09-13 ENCOUNTER — Telehealth: Payer: Self-pay

## 2017-09-13 ENCOUNTER — Ambulatory Visit (INDEPENDENT_AMBULATORY_CARE_PROVIDER_SITE_OTHER): Payer: Medicare Other | Admitting: Family Medicine

## 2017-09-13 VITALS — BP 140/80 | HR 62 | Temp 98.5°F | Resp 18 | Ht 69.0 in | Wt 206.6 lb

## 2017-09-13 DIAGNOSIS — M25552 Pain in left hip: Secondary | ICD-10-CM | POA: Diagnosis not present

## 2017-09-13 DIAGNOSIS — I720 Aneurysm of carotid artery: Secondary | ICD-10-CM

## 2017-09-13 DIAGNOSIS — G4700 Insomnia, unspecified: Secondary | ICD-10-CM

## 2017-09-13 DIAGNOSIS — F329 Major depressive disorder, single episode, unspecified: Secondary | ICD-10-CM | POA: Diagnosis not present

## 2017-09-13 DIAGNOSIS — M5432 Sciatica, left side: Secondary | ICD-10-CM

## 2017-09-13 MED ORDER — DULOXETINE HCL 30 MG PO CPEP: 30.0000 mg | ORAL_CAPSULE | Freq: Every day | ORAL | 1 refills | Status: DC

## 2017-09-13 NOTE — Telephone Encounter (Signed)
Pt returning my voicemail. Provided community resources to him and will also put some in the mail for him that will help with transportation to and from medical appointments.    Josepha Pigg, B.A.  Care Guide - [facility] at [place]

## 2017-09-13 NOTE — Progress Notes (Signed)
Subjective:    Patient ID: [name], male    DOB: Jan 22, 1951, 67 y.o.   MRN: [id number]  HPI [name] is a 67 y.o. male Presents today for: Chief Complaint  Patient presents with  . Insomnia    Pt states meds given at last visit for insomina didn't do any good.   . Hip Pain    Pt states he went [facility] and was given cortisone shot and pt state it did help a little bit.  . Follow-up     L hip and back pain.  See prior evaluations. Possible avascular necrosis of hip and sciatica.  Did not experience significant relief with prednisone for possible sciatic in December, slight improvement when treated in July. We have treated with narcotic pain medication with plan on follow-up with orthopedics.  Had been referred to orthopedics but had not returned call. I wrote for W/C last visit for temporary use, but discussed need to discuss assistive devices with ortho depending on plan.  Also order MRI, but follow up with orthopedics stressed Since last visit Feb 11th, he has followed up with [facility] [employer] [facility]. Saw [name] 09/01/17. He had good relief with anesthetic phase of left hip injection that day plan for recheck in 2 weeks and new walker prescription was provided. Appointment in 2 days with [name]. Injection eased up pain - still taking hydrocodone once per day - does not feel that it helps too much. Does not feel like those help that much so only taking once-twice per day. Still has some left.   Insomnia Chronic worsening insomnia had been discussed in December. Short-term hydroxyzine was prescribed while waiting for sleep specialist evaluation. No show for appointment January 16. He is stated he had had difficulty following asleep for over a year did not feel that hydroxyzine helped at that initial dose. Had been using marijuana to assist with sleep, but denied other illicit drug use.  Hydroxy was increased to 25-50 mg daily at bedtime when necessary. Allergy to  trazodone, and with current narcotic use, avoided benzodiazepine.  Appears to be scheduled for appointment with sleep specialist March 12.  Tried taking one of the hydroxyzine - did not feel that it helped. Does feel depressed with the pain. Denies SI. Would like to try SNRI, or daily med.   Depression screen [facility] 2/9 08/29/2017 07/11/2017 06/23/2017 05/27/2016 12/08/2015  Decreased Interest 1 0 0 0 0  Down, Depressed, Hopeless 1 0 0 0 0  PHQ - 2 Score 2 0 0 0 0  Altered sleeping 3 - - - 0  Tired, decreased energy 2 - - - 0  Change in appetite 3 - - - 0  Feeling bad or failure about yourself  1 - - - 0  Trouble concentrating 3 - - - 0  Moving slowly or fidgety/restless 3 - - - 0  Suicidal thoughts 0 - - - 0  PHQ-9 Score 17 - - - 0  Difficult doing work/chores - - - - Not difficult at all     Carotid aneurysm bilateral  -See prior visits, stressed importance of meeting with vascular surgeon as referral had been placed previously. phone number was provided to patient to schedule appointment last visit. Appears to have an appointment scheduled March 22 with Dr. Trula Slade. Has neck cramps at times. No new headaches or weakness/visual symptoms.   Patient Active Problem List   Diagnosis Date Noted  . Anemia, iron deficiency   . Gastrointestinal  bleeding 10/24/2015  . Absolute anemia   . Tobacco abuse   . Acute ischemic stroke ([place])   . Acute CVA (cerebrovascular accident) ([place]) 05/31/2015  . Stroke ([place]) 05/31/2015  . Dyspnea 04/10/2014  . HTN (hypertension) 04/10/2014  . Inguinal hernia without mention of obstruction or gangrene, unilateral or unspecified, (not specified as recurrent)-right 01/30/2014   Past Medical History:  Diagnosis Date  . Arthritis   . Chronic back pain   . GERD (gastroesophageal reflux disease)    uses Baking Soda  . GIB (gastrointestinal bleeding) 2012  . Glaucoma    right eye  . Headache    occasionally  . Hepatitis C   . History of blood transfusion    no  abnormal reaction noted  . History of gout    doesn't take any meds  . Hyperlipidemia    not on any meds  . Hypertension    takes Amlodipine and Lisinopril daily  . Insomnia    takes Ambien nightly  . Ischemic colitis ([place]) 2012  . Joint pain   . Nocturia   . Numbness    both legs occasionally  . PONV (postoperative nausea and vomiting)   . Prostate cancer ([place])   . Shortness of breath dyspnea    rarely but when notices he can be lying/sitting/exertion.Dr.Hochrein is aware per pt  . Stroke ([place]) 2016   . Urinary frequency   . Urinary urgency    Past Surgical History:  Procedure Laterality Date  . APPENDECTOMY    . COLONOSCOPY    . COLONOSCOPY N/A 10/26/2015   Procedure: COLONOSCOPY;  Surgeon: Doran Stabler, MD;  Location: [facility] ENDOSCOPY;  Service: Endoscopy;  Laterality: N/A;  . ESOPHAGOGASTRODUODENOSCOPY    . ESOPHAGOGASTRODUODENOSCOPY N/A 10/26/2015   Procedure: ESOPHAGOGASTRODUODENOSCOPY (EGD);  Surgeon: Doran Stabler, MD;  Location: [facility] ENDOSCOPY;  Service: Endoscopy;  Laterality: N/A;  . INGUINAL HERNIA REPAIR Right 11/04/2014   Procedure: RIGHT INGUINAL HERNIA REPAIR WITH MESH;  Surgeon: Jackolyn Confer, MD;  Location: [place];  Service: General;  Laterality: Right;  . MASS EXCISION N/A 11/04/2014   Procedure: REMOVAL OF RIGHT GROIN SOFT TISSUE MASS;  Surgeon: Jackolyn Confer, MD;  Location: [place];  Service: General;  Laterality: N/A;  . Multiple abdominal surgeries     total of 13  . PROSTATECTOMY    . SMALL INTESTINE SURGERY     Allergies  Allergen Reactions  . Penicillins Anaphylaxis    Has patient had a PCN reaction causing immediate rash, facial/tongue/throat swelling, SOB or lightheadedness with hypotension: Yes Has patient had a PCN reaction causing severe rash involving mucus membranes or skin necrosis: Unk Has patient had a PCN reaction that required hospitalization: Unk Has patient had a PCN reaction occurring within the last 10 years: No If all of the  above answers are "NO", then may proceed with Cephalosporin use..  . Sudafed [Pseudoephedrine] Other (See Comments)    Heart "races"  . Trazodone And Nefazodone Swelling   Prior to Admission medications   Medication Sig Start Date End Date Taking? Authorizing Provider  amLODipine (NORVASC) 5 MG tablet Take 1 tablet (5 mg total) by mouth daily. 07/11/17  Yes [name], MD  hydrochlorothiazide (MICROZIDE) 12.5 MG capsule Take 1 capsule (12.5 mg total) by mouth daily. 06/23/17  Yes [name], MD  HYDROcodone-acetaminophen (NORCO) 7.5-325 MG tablet Take 1 tablet by mouth every 6 (six) hours as needed for moderate pain. For chronic pain 08/29/17  Yes Merri Ray  R, MD  hydrOXYzine (ATARAX/VISTARIL) 25 MG tablet Take 1-2 tablets (25-50 mg total) by mouth at bedtime as needed (sleep). 08/29/17  Yes [name], MD  lisinopril (PRINIVIL,ZESTRIL) 40 MG tablet Take 1 tablet (40 mg total) by mouth daily. 06/23/17  Yes [name], MD  acetaminophen (TYLENOL) 325 MG tablet Take 2 tablets (650 mg total) by mouth every 6 (six) hours as needed for mild pain or moderate pain. Patient not taking: Reported on 08/29/2017 07/26/17   Waynetta Pean, PA-C  Diclofenac Sodium 3 % GEL Place 1 application onto the skin 2 (two) times daily. To affected area. Patient not taking: Reported on 08/29/2017 07/26/17   Waynetta Pean, PA-C   Social History   Socioeconomic History  . Marital status: Legally Separated    Spouse name: Not on file  . Number of children: 2  . Years of education: Not on file  . Highest education level: Not on file  Social Needs  . Financial resource strain: Not on file  . Food insecurity - worry: Not on file  . Food insecurity - inability: Not on file  . Transportation needs - medical: Not on file  . Transportation needs - non-medical: Not on file  Occupational History  . Not on file  Tobacco Use  . Smoking status: Current Every Day Smoker    Packs/day: 0.50     Years: 50.00    Pack years: 25.00    Types: Cigarettes  . Smokeless tobacco: Never Used  Substance and Sexual Activity  . Alcohol use: Yes    Alcohol/week: 0.0 oz    Comment: occasionally  . Drug use: No  . Sexual activity: Yes  Other Topics Concern  . Not on file  Social History Narrative   One living child .  One murdered.  Lives with girlfriend.       Review of Systems     Objective:   Physical Exam  Constitutional: He is oriented to person, place, and time. He appears well-developed and well-nourished. No distress.  HENT:  Head: Normocephalic and atraumatic.    Neck: Carotid bruit is not present.  Cardiovascular: Normal rate.  Pulmonary/Chest: Effort normal.  Musculoskeletal:       Left hip: He exhibits tenderness (Pain with seated straight leg raise at hip as well as radiating pain from back down thigh.).  Neurological: He is alert and oriented to person, place, and time.  Skin: Skin is warm and dry.  Psychiatric: His speech is normal and behavior is normal. Thought content normal.  Flat affect, appropriate response to questioning, denies suicidal ideation.      Vitals:   09/13/17 1104  BP: 140/80  Pulse: 62  Resp: 18  Temp: 98.5 F (36.9 C)  TempSrc: Oral  SpO2: 99%  Weight: 206 lb 9.6 oz (93.7 kg)  Height: 5\' 9"  (1.753 m)       Assessment & Plan:   Caylon Saine is a 67 y.o. male Left hip pain - Plan: DULoxetine (CYMBALTA) 30 MG capsule Left sciatic nerve pain - Plan: DULoxetine (CYMBALTA) 30 MG capsule  -  pain likely combination of hip and back. Did have some relief with injection from orthopedics. Still having some persistent symptoms but follow-up visit with orthopedics in few days.  -Has hydrocodone if needed -potential side effects discussed  -Discuss trial of Cymbalta for chronic pain and likely reactive depression as below. Potential side effects discussed, recheck 4-6 weeks  Insomnia, unspecified type - Plan: DULoxetine (CYMBALTA) 30  MG capsule  -Trial of higher dose of hydroxyzine as needed, 25-50 mg daily at bedtime. Potential side effects discussed. Treatment of pain may also help. Cymbalta as above.  -Continue follow-up with sleep specialist as planned  Reactive depression - Plan: DULoxetine (CYMBALTA) 30 MG capsule  -Chronic pain component likely, but we'll start Cymbalta for depressive symptoms, side effects discussed, recheck 4-6 weeks  Carotid aneurysm, left ([place]) Carotid aneurysm, right ([place])  -Denies any symptoms, has follow-up planned with vascular. RTC/ER precautions if worsening  Meds ordered this encounter  Medications  . DULoxetine (CYMBALTA) 30 MG capsule    Sig: Take 1 capsule (30 mg total) by mouth daily. For 1 week, then increase to BID.    Dispense:  60 capsule    Refill:  1   Patient Instructions   Follow up with [name] as planned in 2 days. Discuss pain treatment with orthopaedics and next step. I will not be able to provide long term narcotics, so If those are needed, I can refer you to a pain management specialist.   Keep appointment with sleep specialist March 12th. Here is their address and contact information. Try 2 of the hydroxyzine at bedtime. Be careful with that higher dose as it may cause more sleepiness, dizziness. Use walker or cane.  [facility]  Sleep clinic in [place], [place]  Address: [address] [place], [place] [address]  Phone: [phone]  Try starting Cymbalta for depression and pain. Once per day for 1st week, then if doing ok at that dose - increase to twice per day. Follow up with me in next 4-6 weeks.   Return to the clinic or go to the nearest emergency room if any of your symptoms worsen or new symptoms occur.    IF you received an x-ray today, you will receive an invoice from [facility]. Please contact [facility] at [phone] with questions or concerns regarding your invoice.   IF you received  labwork today, you will receive an invoice from [place]. Please contact [facility] at [phone] with questions or concerns regarding your invoice.   Our billing staff will not be able to assist you with questions regarding bills from these companies.  You will be contacted with the lab results as soon as they are available. The fastest way to get your results is to activate your My Chart account. Instructions are located on the last page of this paperwork. If you have not heard from [place] regarding the results in 2 weeks, please contact this office.      Signed,   Merri Ray, MD [facility] at [place].  09/13/17 11:52 AM

## 2017-09-13 NOTE — Patient Instructions (Addendum)
Follow up with [name] as planned in 2 days. Discuss pain treatment with orthopaedics and next step. I will not be able to provide long term narcotics, so If those are needed, I can refer you to a pain management specialist.   Keep appointment with sleep specialist March 12th. Here is their address and contact information. Try 2 of the hydroxyzine at bedtime. Be careful with that higher dose as it may cause more sleepiness, dizziness. Use walker or cane.  [facility]  Sleep clinic in [place], [place]  Address: [address], [place], [place] [address]  Phone: [phone]  Try starting Cymbalta for depression and pain. Once per day for 1st week, then if doing ok at that dose - increase to twice per day. Follow up with me in next 4-6 weeks.   Return to the clinic or go to the nearest emergency room if any of your symptoms worsen or new symptoms occur.    IF you received an x-ray today, you will receive an invoice from [facility]. Please contact [facility] at [phone] with questions or concerns regarding your invoice.   IF you received labwork today, you will receive an invoice from [facility]. Please contact [facility] at [phone] with questions or concerns regarding your invoice.   Our billing staff will not be able to assist you with questions regarding bills from these companies.  You will be contacted with the lab results as soon as they are available. The fastest way to get your results is to activate your My Chart account. Instructions are located on the last page of this paperwork. If you have not heard from [place] regarding the results in 2 weeks, please contact this office.

## 2017-09-14 ENCOUNTER — Encounter: Payer: Self-pay | Admitting: Family Medicine

## 2017-09-15 ENCOUNTER — Ambulatory Visit (INDEPENDENT_AMBULATORY_CARE_PROVIDER_SITE_OTHER): Payer: Medicare Other | Admitting: Orthopaedic Surgery

## 2017-09-15 DIAGNOSIS — M25552 Pain in left hip: Secondary | ICD-10-CM

## 2017-09-15 NOTE — Progress Notes (Signed)
Office Visit Note   Patient: [name]           Date of Birth: 08-05-1950           MRN: [id number] Visit Date: 09/15/2017              Requested by: [name], MD [address] [place], [place] [address] PCP: [name], MD   Assessment & Plan: Visit Diagnoses:  1. Pain of left hip joint     Plan: At this point patient has had only partial relief from the hip injection.  Recommend spine and left hip to rule out structural.  Follow-up after MRI.  Follow-Up Instructions: Return in about 2 weeks (around 09/29/2017).   Orders:  No orders of the defined types were placed in this encounter.  No orders of the defined types were placed in this encounter.     Procedures: No procedures performed   Clinical Data: No additional findings.   Subjective: Chief Complaint  Patient presents with  . Left Hip - Pain, Follow-up    Patient follows up for left h pain.  He only partial relief from the injection.    Review of Systems  Constitutional: Negative.   All other systems reviewed and are negative.    Objective: Vital Signs: There were no vitals taken for this visit.  Physical Exam  Constitutional: He is oriented to person, place, and time. He appears well-developed and well-nourished.  Pulmonary/Chest: Effort normal.  Abdominal: Soft.  Neurological: He is alert and oriented to person, place, and time.  Skin: Skin is warm.  Psychiatric: He has a normal mood and affect. His behavior is normal. Judgment and thought content normal.  Nursing note and vitals reviewed.   Ortho Exam Exam stable Specialty Comments:  No specialty comments available.  Imaging: No results found.   PMFS History: Patient Active Problem List   Diagnosis Date Noted  . Anemia, iron deficiency   . Gastrointestinal bleeding 10/24/2015  . Absolute anemia   . Tobacco abuse   . Acute ischemic stroke ([place])   . Acute CVA (cerebrovascular accident) ([place]) 05/31/2015  .  Stroke ([place]) 05/31/2015  . Dyspnea 04/10/2014  . HTN (hypertension) 04/10/2014  . Inguinal hernia without mention of obstruction or gangrene, unilateral or unspecified, (not specified as recurrent)-right 01/30/2014   Past Medical History:  Diagnosis Date  . Arthritis   . Chronic back pain   . GERD (gastroesophageal reflux disease)    uses Baking Soda  . GIB (gastrointestinal bleeding) 2012  . Glaucoma    right eye  . Headache    occasionally  . Hepatitis C   . History of blood transfusion    no abnormal reaction noted  . History of gout    doesn't take any meds  . Hyperlipidemia    not on any meds  . Hypertension    takes Amlodipine and Lisinopril daily  . Insomnia    takes Ambien nightly  . Ischemic colitis ([place]) 2012  . Joint pain   . Nocturia   . Numbness    both legs occasionally  . PONV (postoperative nausea and vomiting)   . Prostate cancer ([place])   . Shortness of breath dyspnea    rarely but when notices he can be lying/sitting/exertion.Dr.Hochrein is aware per pt  . Stroke ([place]) 2016   . Urinary frequency   . Urinary urgency     Family History  Problem Relation Age of Onset  . Alzheimer's disease  Father   . Cancer Mother        Lymph node  . Heart failure Brother 45       Transplant 13 years ago  . CAD Brother   . Heart disease Sister 70       MI    Past Surgical History:  Procedure Laterality Date  . APPENDECTOMY    . COLONOSCOPY    . COLONOSCOPY N/A 10/26/2015   Procedure: COLONOSCOPY;  Surgeon: Doran Stabler, MD;  Location[facility] ENDOSCOPY;  Service: Endoscopy;  Laterality: N/A;  . ESOPHAGOGASTRODUODENOSCOPY    . ESOPHAGOGASTRODUODENOSCOPY N/A 10/26/2015   Procedure: ESOPHAGOGASTRODUODENOSCOPY (EGD);  Surgeon: Doran Stabler, MD;  Location: [facility] ENDOSCOPY;  Service: Endoscopy;  Laterality: N/A;  . INGUINAL HERNIA REPAIR Right 11/04/2014   Procedure: RIGHT INGUINAL HERNIA REPAIR WITH MESH;  Surgeon: Jackolyn Confer, MD;  Location: [facility];  Service:  General;  Laterality: Right;  . MASS EXCISION N/A 11/04/2014   Procedure: REMOVAL OF RIGHT GROIN SOFT TISSUE MASS;  Surgeon: Jackolyn Confer, MD;  Location: [facility];  Service: General;  Laterality: N/A;  . Multiple abdominal surgeries     total of 13  . PROSTATECTOMY    . SMALL INTESTINE SURGERY     Social History   Occupational History  . Not on file  Tobacco Use  . Smoking status: Current Every Day Smoker    Packs/day: 0.50    Years: 50.00    Pack years: 25.00    Types: Cigarettes  . Smokeless tobacco: Never Used  Substance and Sexual Activity  . Alcohol use: Yes    Alcohol/week: 0.0 oz    Comment: occasionally  . Drug use: No  . Sexual activity: Yes

## 2017-09-15 NOTE — Addendum Note (Signed)
Addended by: Precious Bard on: 09/15/2017 11:04 AM   Modules accepted: Orders

## 2017-09-21 ENCOUNTER — Ambulatory Visit
Admission: RE | Admit: 2017-09-21 | Discharge: 2017-09-21 | Disposition: A | Discharge status: Home / Self Care | Payer: Medicare Other | Source: Ambulatory Visit | Attending: Family Medicine | Admitting: Family Medicine

## 2017-09-21 ENCOUNTER — Ambulatory Visit: Payer: Self-pay | Admitting: *Deleted

## 2017-09-21 DIAGNOSIS — M5442 Lumbago with sciatica, left side: Secondary | ICD-10-CM

## 2017-09-21 NOTE — Telephone Encounter (Signed)
Pt called to report he was not able to have the MRI scheduled for him today due to claustrophobia. Was seen by Dr. Carlota Raspberry 09/13/17 left hip and back pain. Saw [name] 09/15/17.  Pt was told to contact his PCP for medication prior to MRI.  Please advise: # [phone]

## 2017-09-22 NOTE — Telephone Encounter (Signed)
Phone call to patient. Unable to reach.   If patient calls back, please relay message from Dr. Carlota Raspberry.

## 2017-09-22 NOTE — Telephone Encounter (Signed)
Did he try hydroxyzine? That has been prescribed for sleep but may be used if needed for anxiety with imaging study.

## 2017-09-27 ENCOUNTER — Telehealth: Payer: Self-pay | Admitting: Neurology

## 2017-09-27 ENCOUNTER — Institutional Professional Consult (permissible substitution): Payer: Medicare Other | Admitting: Neurology

## 2017-09-27 ENCOUNTER — Encounter: Payer: Self-pay | Admitting: Neurology

## 2017-09-27 NOTE — Telephone Encounter (Signed)
Pt no showed to apt today 

## 2017-09-30 NOTE — Telephone Encounter (Signed)
Pt called to r/s sleep consult. Pt has no showed 5 times since 1/17. Please advise if appt can be r/s.

## 2017-10-02 ENCOUNTER — Other Ambulatory Visit: Payer: Self-pay

## 2017-10-02 DIAGNOSIS — R51 Headache: Secondary | ICD-10-CM | POA: Insufficient documentation

## 2017-10-02 DIAGNOSIS — Z5321 Procedure and treatment not carried out due to patient leaving prior to being seen by health care provider: Secondary | ICD-10-CM | POA: Diagnosis not present

## 2017-10-02 NOTE — ED Triage Notes (Signed)
Reports having sciatic pain in left hip and its going into the right hip.  Also endorses a headache since 4pm tonight.  Reports taking a bayer aspirin.  BP elevated in triage.  Also c/o neck pain for 3-4 days.  Received cortisone shot in left hip 2 weeks ago.

## 2017-10-03 ENCOUNTER — Encounter (HOSPITAL_COMMUNITY): Payer: Self-pay | Admitting: Emergency Medicine

## 2017-10-03 ENCOUNTER — Emergency Department (HOSPITAL_COMMUNITY)
Admission: EM | Admit: 2017-10-03 | Discharge: 2017-10-03 | Disposition: A | Discharge status: Home / Self Care | Payer: Medicare Other | Attending: Emergency Medicine | Admitting: Emergency Medicine

## 2017-10-03 DIAGNOSIS — R51 Headache: Secondary | ICD-10-CM | POA: Diagnosis not present

## 2017-10-03 MED ORDER — IBUPROFEN 400 MG PO TABS
400.0000 mg | ORAL_TABLET | Freq: Once | ORAL | Status: AC | PRN
  Administered 2017-10-03: 400 mg via ORAL
  Filled 2017-10-03: qty 1

## 2017-10-03 NOTE — Telephone Encounter (Signed)
Please:  Dismiss, send to [employer].

## 2017-10-04 ENCOUNTER — Ambulatory Visit (INDEPENDENT_AMBULATORY_CARE_PROVIDER_SITE_OTHER): Payer: Medicare Other | Admitting: Orthopaedic Surgery

## 2017-10-04 ENCOUNTER — Encounter: Payer: Self-pay | Admitting: Neurology

## 2017-10-04 NOTE — Telephone Encounter (Signed)
Left message for patient to call back  

## 2017-10-04 NOTE — Telephone Encounter (Signed)
Please call and advise patient that he was dismissed from sleep specialist as he no showed for multiple appts. If he would agree to sleep evaluation as we have recommended, may need to refer to other office.

## 2017-10-05 ENCOUNTER — Telehealth: Payer: Self-pay | Admitting: Family Medicine

## 2017-10-05 NOTE — Telephone Encounter (Signed)
Copied from [place]. Topic: Referral - Request >> Oct 04, 2017 12:47 PM Neva Seat wrote: Urologist referral to [facility] - pt missed 2 appts.  They will not reschedule an appt.  Pt is needing another urologist referral.  Please advise

## 2017-10-05 NOTE — Telephone Encounter (Signed)
I do not mind referring him to another urologist, but I am concerned with him missing appts as he was also dismissed from sleep specialist due to no shows. He has follow up with me in 6 days and can discuss symptoms and reason for referral at that time, as I do not think we have discussed his urinary issues. Make sure he keeps that appointment. Let me know if there are questions in the meantime.

## 2017-10-06 NOTE — Telephone Encounter (Signed)
Phone call to patient. He states he missed urology appointments due to being given wrong phone number, and neurology appointments due to being given wrong address. Confirmed date and time for appointment with Dr. Carlota Raspberry next week. Patient states he will be here for this appointment. Has no further questions.

## 2017-10-07 ENCOUNTER — Encounter: Payer: Self-pay | Admitting: Surgery

## 2017-10-07 ENCOUNTER — Other Ambulatory Visit: Payer: Self-pay

## 2017-10-07 ENCOUNTER — Ambulatory Visit (INDEPENDENT_AMBULATORY_CARE_PROVIDER_SITE_OTHER): Payer: Medicare Other | Admitting: Surgery

## 2017-10-07 VITALS — BP 167/81 | HR 73 | Temp 97.0°F | Resp 18 | Ht 71.5 in | Wt 209.0 lb

## 2017-10-07 DIAGNOSIS — I720 Aneurysm of carotid artery: Secondary | ICD-10-CM

## 2017-10-07 DIAGNOSIS — Z01812 Encounter for preprocedural laboratory examination: Secondary | ICD-10-CM

## 2017-10-07 NOTE — Progress Notes (Signed)
Vitals:   10/07/17 1507 10/07/17 1515 10/07/17 1516  BP: (!) 146/78 (!) 141/79 (!) 171/79  Pulse: 73 73 73  Resp: 18    Temp: (!) 97 F (36.1 C)    TempSrc: Oral    SpO2: 99%    Weight: 209 lb (94.8 kg)    Height: 5' 11.5" (1.816 m)

## 2017-10-07 NOTE — Progress Notes (Signed)
[facility] [place]  Patient name: [name] MRN: [id number] DOB: 01/18/1951 Sex: male   REQUESTING PROVIDER:    Dr. Nyoka Cowden   REASON FOR CONSULT:    Carotid aneurysms  HISTORY OF PRESENT ILLNESS:   [name] is a 67 y.o. male, who is has a history of a stroke approximately 2 years ago.  At that time he had a normal carotid duplex.  He is left with residual left-sided weakness.  He is now having pulsatile tinnitus.  A recent ultrasound suggested possible aneurysmal carotid artery disease.  CT scan was ordered but has not been performed.  The patient does note some cramping and bulging is lower neck to 3 on the right.  The patient has chronic back pain.  He suffers from hepatitis C.  He is medically managed for hypertension.  He is on an ACE inhibitor.  He has hypercholesterolemia but does not take any medications.  He is a current smoker.  PAST MEDICAL HISTORY    Past Medical History:  Diagnosis Date  . Arthritis   . Chronic back pain   . GERD (gastroesophageal reflux disease)    uses Baking Soda  . GIB (gastrointestinal bleeding) 2012  . Glaucoma    right eye  . Headache    occasionally  . Hepatitis C   . History of blood transfusion    no abnormal reaction noted  . History of gout    doesn't take any meds  . Hyperlipidemia    not on any meds  . Hypertension    takes Amlodipine and Lisinopril daily  . Insomnia    takes Ambien nightly  . Ischemic colitis ([place]) 2012  . Joint pain   . Nocturia   . Numbness    both legs occasionally  . PONV (postoperative nausea and vomiting)   . Prostate cancer ([place])   . Shortness of breath dyspnea    rarely but when notices he can be lying/sitting/exertion.Dr.Hochrein is aware per pt  . Stroke ([place]) 2016   . Urinary frequency   . Urinary urgency      FAMILY HISTORY   Family History  Problem Relation Age of Onset  . Alzheimer's disease Father   .  Cancer Mother        Lymph node  . Heart failure Brother 45       Transplant 13 years ago  . CAD Brother   . Heart disease Sister 77       MI    SOCIAL HISTORY:   Social History   Socioeconomic History  . Marital status: Legally Separated    Spouse name: Not on file  . Number of children: 2  . Years of education: Not on file  . Highest education level: Not on file  Occupational History  . Not on file  Social Needs  . Financial resource strain: Not on file  . Food insecurity:    Worry: Not on file    Inability: Not on file  . Transportation needs:    Medical: Not on file    Non-medical: Not on file  Tobacco Use  . Smoking status: Current Every Day Smoker    Packs/day: 0.50    Years: 50.00    Pack years: 25.00    Types: Cigarettes  . Smokeless tobacco: Never Used  Substance and Sexual Activity  . Alcohol use: Yes    Alcohol/week: 0.0 oz    Comment: occasionally  . Drug use: No  Frequency: 5.0 times per week  . Sexual activity: Yes  Lifestyle  . Physical activity:    Days per week: Not on file    Minutes per session: Not on file  . Stress: Not on file  Relationships  . Social connections:    Talks on phone: Not on file    Gets together: Not on file    Attends religious service: Not on file    Active member of club or organization: Not on file    Attends meetings of clubs or organizations: Not on file    Relationship status: Not on file  . Intimate partner violence:    Fear of current or ex partner: Not on file    Emotionally abused: Not on file    Physically abused: Not on file    Forced sexual activity: Not on file  Other Topics Concern  . Not on file  Social History Narrative   One living child .  One murdered.  Lives with girlfriend.      ALLERGIES:    Allergies  Allergen Reactions  . Penicillins Anaphylaxis    Has patient had a PCN reaction causing immediate rash, facial/tongue/throat swelling, SOB or lightheadedness with hypotension:  Yes Has patient had a PCN reaction causing severe rash involving mucus membranes or skin necrosis: Unk Has patient had a PCN reaction that required hospitalization: Unk Has patient had a PCN reaction occurring within the last 10 years: No If all of the above answers are "NO", then may proceed with Cephalosporin use..  . Pseudoephedrine Hcl   . Sudafed [Pseudoephedrine] Other (See Comments)    Heart "races"  . Trazodone And Nefazodone Swelling    CURRENT MEDICATIONS:    Current Outpatient Medications  Medication Sig Dispense Refill  . amLODipine (NORVASC) 5 MG tablet Take 1 tablet (5 mg total) by mouth daily. 90 tablet 1  . Diclofenac Sodium 3 % GEL Place 1 application onto the skin 2 (two) times daily. To affected area. 100 g 0  . DULoxetine (CYMBALTA) 30 MG capsule Take 1 capsule (30 mg total) by mouth daily. For 1 week, then increase to BID. 60 capsule 1  . hydrochlorothiazide (MICROZIDE) 12.5 MG capsule Take 1 capsule (12.5 mg total) by mouth daily. 90 capsule 1  . HYDROcodone-acetaminophen (NORCO) 7.5-325 MG tablet Take 1 tablet by mouth every 6 (six) hours as needed for moderate pain. For chronic pain 30 tablet 0  . hydrOXYzine (ATARAX/VISTARIL) 25 MG tablet Take 1-2 tablets (25-50 mg total) by mouth at bedtime as needed (sleep). 30 tablet 0  . lisinopril (PRINIVIL,ZESTRIL) 40 MG tablet Take 1 tablet (40 mg total) by mouth daily. 90 tablet 1  . acetaminophen (TYLENOL) 325 MG tablet Take 2 tablets (650 mg total) by mouth every 6 (six) hours as needed for mild pain or moderate pain. (Patient not taking: Reported on 10/07/2017) 60 tablet 0   No current facility-administered medications for this visit.     REVIEW OF SYSTEMS:   [X]  denotes positive finding, [ ]  denotes negative finding Cardiac  Comments:  Chest pain or chest pressure:    Shortness of breath upon exertion: x   Short of breath when lying flat:    Irregular heart rhythm:        Vascular    Pain in calf, thigh, or  hip brought on by ambulation: x   Pain in feet at night that wakes you up from your sleep:     Blood clot in your veins:  Leg swelling:         Pulmonary    Oxygen at home:    Productive cough:     Wheezing:         Neurologic    Sudden weakness in arms or legs:  x   Sudden numbness in arms or legs:     Sudden onset of difficulty speaking or slurred speech:    Temporary loss of vision in one eye:  x   Problems with dizziness:         Gastrointestinal    Blood in stool:      Vomited blood:         Genitourinary    Burning when urinating:     Blood in urine:        Psychiatric    Major depression:         Hematologic    Bleeding problems:    Problems with blood clotting too easily:        Skin    Rashes or ulcers:        Constitutional    Fever or chills:     PHYSICAL EXAM:   Vitals:   10/07/17 1507  BP: (!) 146/78  Pulse: 73  Resp: 18  Temp: (!) 97 F (36.1 C)  TempSrc: Oral  SpO2: 99%  Weight: 209 lb (94.8 kg)  Height: 5' 11.5" (1.816 m)    GENERAL: The patient is a well-nourished male, in no acute distress. The vital signs are documented above. CARDIAC: There is a regular rate and rhythm.  VASCULAR: Prominent appearing bilateral common carotid arteries just above the clavicle.  [place]. PULMONARY: Nonlabored respirations ABDOMEN: Soft and non-tender.  No pulsatile mass MUSCULOSKELETAL: There are no major deformities or cyanosis. NEUROLOGIC: No focal weakness or paresthesias are detected. SKIN: There are no ulcers or rashes noted. PSYCHIATRIC: The patient has a normal affect.  STUDIES:   I have reviewed his most recent ultrasound which suggest bilateral common carotid artery aneurysm  ASSESSMENT and PLAN   I discussed with the patient that in order to confirm the diagnosis of bilateral carotid aneurysm as well as to get an accurate size measurement, he needs to undergo a CT angiogram.  The patient thought that he was getting an MRI and therefore  did not have the procedure done.  I explained to him the CT scan.  I will have this done within the next couple weeks and have him follow-up with me afterwards.   Annamarie Major, MD [facility] [phone] Pager [phone]

## 2017-10-11 ENCOUNTER — Telehealth: Payer: Self-pay | Admitting: Family Medicine

## 2017-10-11 ENCOUNTER — Ambulatory Visit: Payer: Medicare Other | Admitting: Family Medicine

## 2017-10-11 NOTE — Telephone Encounter (Signed)
Pt was 19 minutes late for his appt on 10/11/2017. Pt states that he is needing a refill for his blood pressure medication. I rescheduled his appt for 10/18/2017.

## 2017-10-11 NOTE — Telephone Encounter (Signed)
Pt rescheduled MRI for 3/28 and was advised to take hydroxyzine prior to study.

## 2017-10-11 NOTE — Telephone Encounter (Signed)
Left message to call back  Patient should have medication until May.  Please ask patient if he is completley out or has called pharmacy he was given 90 day supple in 07/13/17 with 1 refill

## 2017-10-13 ENCOUNTER — Other Ambulatory Visit: Payer: Medicare Other

## 2017-10-17 ENCOUNTER — Encounter: Payer: Self-pay | Admitting: Physician Assistant

## 2017-10-18 ENCOUNTER — Other Ambulatory Visit: Payer: Self-pay

## 2017-10-18 ENCOUNTER — Encounter: Payer: Self-pay | Admitting: Family Medicine

## 2017-10-18 ENCOUNTER — Ambulatory Visit (INDEPENDENT_AMBULATORY_CARE_PROVIDER_SITE_OTHER): Payer: Medicare Other | Admitting: Family Medicine

## 2017-10-18 VITALS — BP 180/70 | HR 77 | Temp 97.9°F | Ht 69.5 in | Wt 212.2 lb

## 2017-10-18 DIAGNOSIS — F329 Major depressive disorder, single episode, unspecified: Secondary | ICD-10-CM | POA: Diagnosis not present

## 2017-10-18 DIAGNOSIS — M5432 Sciatica, left side: Secondary | ICD-10-CM

## 2017-10-18 DIAGNOSIS — G4700 Insomnia, unspecified: Secondary | ICD-10-CM

## 2017-10-18 DIAGNOSIS — G894 Chronic pain syndrome: Secondary | ICD-10-CM

## 2017-10-18 DIAGNOSIS — M25552 Pain in left hip: Secondary | ICD-10-CM

## 2017-10-18 DIAGNOSIS — I720 Aneurysm of carotid artery: Secondary | ICD-10-CM

## 2017-10-18 DIAGNOSIS — I10 Essential (primary) hypertension: Secondary | ICD-10-CM

## 2017-10-18 MED ORDER — HYDROCHLOROTHIAZIDE 12.5 MG PO CAPS: 12.5000 mg | ORAL_CAPSULE | Freq: Every day | ORAL | 1 refills | Status: DC

## 2017-10-18 MED ORDER — LISINOPRIL 40 MG PO TABS: 40.0000 mg | ORAL_TABLET | Freq: Every day | ORAL | 1 refills | Status: DC

## 2017-10-18 MED ORDER — AMLODIPINE BESYLATE 10 MG PO TABS: 10.0000 mg | ORAL_TABLET | Freq: Every day | ORAL | 1 refills | Status: DC

## 2017-10-18 MED ORDER — DULOXETINE HCL 30 MG PO CPEP: 30.0000 mg | ORAL_CAPSULE | Freq: Two times a day (BID) | ORAL | 1 refills | Status: DC

## 2017-10-18 NOTE — Patient Instructions (Addendum)
Blood pressure still running too high. Increase the amlodipine to 10mg  per day, no change in other meds for now. If any leg swelling lightheadedness or new side effects - return to discuss symptoms further.   We will clarify the plan from ortho as those shots have helped.  If long term hydrocodone is needed, you will need to meet with pain management.   Increase the duloxetine to twice per day to see if that helps pain and depressions symptoms. 1-2 hydroxyzine at bedtime if needed.   Keep plan for imaging for possible carotid aneurysms, but if any increasing pain in that area, lightheadedness or dizziness - go to emergency room.   Recheck in 1 month.   Return to the clinic or go to the nearest emergency room if any of your symptoms worsen or new symptoms occur.    IF you received an x-ray today, you will receive an invoice from [facility]. Please contact [facility] at [phone] with questions or concerns regarding your invoice.   IF you received labwork today, you will receive an invoice from [facility]. Please contact [facility] at [phone] with questions or concerns regarding your invoice.   Our billing staff will not be able to assist you with questions regarding bills from these companies.  You will be contacted with the lab results as soon as they are available. The fastest way to get your results is to activate your My Chart account. Instructions are located on the last page of this paperwork. If you have not heard from [place] regarding the results in 2 weeks, please contact this office.

## 2017-10-18 NOTE — Progress Notes (Signed)
Subjective:  By signing my name below, I, [name], attest that this documentation has been prepared under the direction and in the presence of [name], MD Electronically Signed: Ladene Artist, ED Scribe 10/18/2017 at 11:33 AM.   Patient ID: [name], male    DOB: 23-Jun-1951, 67 y.o.   MRN: [id number]  Chief Complaint  Patient presents with  . Results    Left hip pain  . Medication Refill    BP Meds   HPI [name] is a 67 y.o. male who presents to Primary Care at [facility] for f/u.  L Hip Pain See previous visit. Followed by ortho [name]. Has received injections and hydrocodone qd in the morning. Suspected combo of hip and back with sciatica. Started Cymbalta 30 mg on 2/26 for possible reactive depression and chronic pain initially at 30 mg qd then bid after 1 wk. Also discussed possible pain management specialist if long term narcotic med needed but reccommended discussion with ortho.  Pt states Cymbalta qd doesn't seem to improve chronic pain but injections does after the 3 day. States he has had back pain for several yrs but hip pain is fairly new. He reports that he is now starting to have pain in the R hip as well. Denies receiving any narcotic prescriptions from any other provider. Also denies feeling down/depressed, SI.  Insomnia See previous visits. He had a sleep specialist appointment on 3/12. Pt has no-showed 5 times since 2017 and was dismissed. Recommended 2 hydroxyzine 25 mg at bedtime if needed with potential side-effects discussed and not to combine with narcotic.  HTN BP Readings from Last 3 Encounters:  10/18/17 (!) 180/70  10/07/17 (!) 167/81  10/02/17 (!) 194/93   Lab Results  Component Value Date   CREATININE 0.87 06/23/2017  Norvasc 5 mg qd, HCTZ 12.5 mg qd, lisinopril 40 mg qd. H/o carotid artery aneurysm with previous neck cramping. Ultimately seen by vascular surgeon 3/22 Dr. Trula Slade. Plan for CT angiogram on 4/10 then f/u to  determine treatment based on results. Pt has been compliant with medications. He does not have a BP monitor at home. He is still having spasms in his neck; reports 3 episodes since being in the office today. Denies cp, sob, lightheadedness, dizziness, leg swelling, blood in stools, any other symptoms.  Patient Active Problem List   Diagnosis Date Noted  . Anemia, iron deficiency   . Gastrointestinal bleeding 10/24/2015  . Absolute anemia   . Tobacco abuse   . Acute ischemic stroke ([place])   . Acute CVA (cerebrovascular accident) ([place]) 05/31/2015  . Stroke ([place]) 05/31/2015  . Dyspnea 04/10/2014  . HTN (hypertension) 04/10/2014  . Inguinal hernia without mention of obstruction or gangrene, unilateral or unspecified, (not specified as recurrent)-right 01/30/2014   Past Medical History:  Diagnosis Date  . Arthritis   . Chronic back pain   . GERD (gastroesophageal reflux disease)    uses Baking Soda  . GIB (gastrointestinal bleeding) 2012  . Glaucoma    right eye  . Headache    occasionally  . Hepatitis C   . History of blood transfusion    no abnormal reaction noted  . History of gout    doesn't take any meds  . Hyperlipidemia    not on any meds  . Hypertension    takes Amlodipine and Lisinopril daily  . Insomnia    takes Ambien nightly  . Ischemic colitis ([place]) 2012  . Joint pain   .  Nocturia   . Numbness    both legs occasionally  . PONV (postoperative nausea and vomiting)   . Prostate cancer ([place])   . Shortness of breath dyspnea    rarely but when notices he can be lying/sitting/exertion.Dr.Hochrein is aware per pt  . Stroke ([place]) 2016   . Urinary frequency   . Urinary urgency    Past Surgical History:  Procedure Laterality Date  . APPENDECTOMY    . COLONOSCOPY    . COLONOSCOPY N/A 10/26/2015   Procedure: COLONOSCOPY;  Surgeon: Doran Stabler, MD;  Location: [facility] ENDOSCOPY;  Service: Endoscopy;  Laterality: N/A;  . ESOPHAGOGASTRODUODENOSCOPY    .  ESOPHAGOGASTRODUODENOSCOPY N/A 10/26/2015   Procedure: ESOPHAGOGASTRODUODENOSCOPY (EGD);  Surgeon: Doran Stabler, MD;  Location: [facility] ENDOSCOPY;  Service: Endoscopy;  Laterality: N/A;  . INGUINAL HERNIA REPAIR Right 11/04/2014   Procedure: RIGHT INGUINAL HERNIA REPAIR WITH MESH;  Surgeon: Jackolyn Confer, MD;  Location: [place];  Service: General;  Laterality: Right;  . MASS EXCISION N/A 11/04/2014   Procedure: REMOVAL OF RIGHT GROIN SOFT TISSUE MASS;  Surgeon: Jackolyn Confer, MD;  Location: [place];  Service: General;  Laterality: N/A;  . Multiple abdominal surgeries     total of 13  . PROSTATECTOMY    . SMALL INTESTINE SURGERY     Allergies  Allergen Reactions  . Penicillins Anaphylaxis    Has patient had a PCN reaction causing immediate rash, facial/tongue/throat swelling, SOB or lightheadedness with hypotension: Yes Has patient had a PCN reaction causing severe rash involving mucus membranes or skin necrosis: Unk Has patient had a PCN reaction that required hospitalization: Unk Has patient had a PCN reaction occurring within the last 10 years: No If all of the above answers are "NO", then may proceed with Cephalosporin use..  . Pseudoephedrine Hcl   . Sudafed [Pseudoephedrine] Other (See Comments)    Heart "races"  . Trazodone And Nefazodone Swelling   Prior to Admission medications   Medication Sig Start Date End Date Taking? Authorizing Provider  acetaminophen (TYLENOL) 325 MG tablet Take 2 tablets (650 mg total) by mouth every 6 (six) hours as needed for mild pain or moderate pain. Patient not taking: Reported on 10/07/2017 07/26/17   Waynetta Pean, PA-C  amLODipine (NORVASC) 5 MG tablet Take 1 tablet (5 mg total) by mouth daily. 07/11/17   [name], MD  Diclofenac Sodium 3 % GEL Place 1 application onto the skin 2 (two) times daily. To affected area. 07/26/17   Waynetta Pean, PA-C  DULoxetine (CYMBALTA) 30 MG capsule Take 1 capsule (30 mg total) by mouth daily. For 1 week,  then increase to BID. 09/13/17   [name], MD  hydrochlorothiazide (MICROZIDE) 12.5 MG capsule Take 1 capsule (12.5 mg total) by mouth daily. 06/23/17   [name], MD  HYDROcodone-acetaminophen (NORCO) 7.5-325 MG tablet Take 1 tablet by mouth every 6 (six) hours as needed for moderate pain. For chronic pain 08/29/17   [name], MD  hydrOXYzine (ATARAX/VISTARIL) 25 MG tablet Take 1-2 tablets (25-50 mg total) by mouth at bedtime as needed (sleep). 08/29/17   [name], MD  lisinopril (PRINIVIL,ZESTRIL) 40 MG tablet Take 1 tablet (40 mg total) by mouth daily. 06/23/17   [name], MD   Social History   Socioeconomic History  . Marital status: Legally Separated    Spouse name: Not on file  . Number of children: 2  . Years of education: Not on file  .  Highest education level: Not on file  Occupational History  . Not on file  Social Needs  . Financial resource strain: Not on file  . Food insecurity:    Worry: Not on file    Inability: Not on file  . Transportation needs:    Medical: Not on file    Non-medical: Not on file  Tobacco Use  . Smoking status: Current Every Day Smoker    Packs/day: 0.50    Years: 50.00    Pack years: 25.00    Types: Cigarettes  . Smokeless tobacco: Never Used  Substance and Sexual Activity  . Alcohol use: Yes    Alcohol/week: 0.0 oz    Comment: occasionally  . Drug use: No    Frequency: 5.0 times per week  . Sexual activity: Yes  Lifestyle  . Physical activity:    Days per week: Not on file    Minutes per session: Not on file  . Stress: Not on file  Relationships  . Social connections:    Talks on phone: Not on file    Gets together: Not on file    Attends religious service: Not on file    Active member of club or organization: Not on file    Attends meetings of clubs or organizations: Not on file    Relationship status: Not on file  . Intimate partner violence:    Fear of current or ex partner: Not on file      Emotionally abused: Not on file    Physically abused: Not on file    Forced sexual activity: Not on file  Other Topics Concern  . Not on file  Social History Narrative   One living child .  One murdered.  Lives with girlfriend.     Review of Systems  Constitutional: Negative for fatigue and unexpected weight change.  Eyes: Negative for visual disturbance.  Respiratory: Negative for cough, chest tightness and shortness of breath.   Cardiovascular: Negative for chest pain, palpitations and leg swelling.  Gastrointestinal: Negative for abdominal pain and blood in stool.  Musculoskeletal: Positive for arthralgias, back pain and neck pain.  Neurological: Negative for dizziness, light-headedness and headaches.  Psychiatric/Behavioral: Positive for sleep disturbance. Negative for dysphoric mood and suicidal ideas.      Objective:   Physical Exam  Constitutional: He is oriented to person, place, and time. He appears well-developed and well-nourished.  HENT:  Head: Normocephalic and atraumatic.  Eyes: Pupils are equal, round, and reactive to light. EOM are normal.  Neck: No JVD present. Carotid bruit is not present.  Cardiovascular: Normal rate, regular rhythm and normal heart sounds.  No murmur heard. Pulmonary/Chest: Effort normal and breath sounds normal. He has no rales.  Musculoskeletal: He exhibits no edema.  Ambulating with a cane  Neurological: He is alert and oriented to person, place, and time.  Skin: Skin is warm and dry.  Psychiatric: He has a normal mood and affect.  Vitals reviewed.  Vitals:   10/18/17 1043  BP: (!) 180/70  Pulse: 77  Temp: 97.9 F (36.6 C)  TempSrc: Oral  SpO2: 100%  Weight: 212 lb 3.2 oz (96.3 kg)  Height: 5' 9.5" (1.765 m)      Assessment & Plan:   [name] is a 67 y.o. male Carotid artery aneurysm ([place])  -Upcoming plan for CT for further imaging, now followed by vascular.  Will improve BP control as below, episodic neck pains  are likely related to muscle spasm, but  acute worsening ER precautions were discussed  Essential hypertension - Plan: lisinopril (PRINIVIL,ZESTRIL) 40 MG tablet, hydrochlorothiazide (MICROZIDE) 12.5 MG capsule, amLODipine (NORVASC) 10 MG tablet  -Decreased control, will increase amlodipine to 10 mg daily, continue same dose of lisinopril and HCTZ.  Check 1 month  Chronic pain syndrome Reactive depression - Plan: DULoxetine (CYMBALTA) 30 MG capsule Insomnia, unspecified type - Plan: Ambulatory referral to Sleep Studies, DULoxetine (CYMBALTA) 30 MG capsule  -Increase Cymbalta to twice daily to see if that will help with anxiety/depression symptoms as well as chronic pain, which may also help with insomnia.  Continue hydroxyzine 1-2 at bedtime as needed, potential side effects discussed.  Left hip pain - Plan: DULoxetine (CYMBALTA) 30 MG capsule Left sciatic nerve pain - Plan: DULoxetine (CYMBALTA) 30 MG capsule  -Followed by orthopedics.  Will watch for recent notes to determine next step in plan.  If long-term hydrocodone is required will need to refer to pain management.  Did report some relief with injections by orthopedics.  Cymbalta increased dose may also help.  Meds ordered this encounter  Medications  . lisinopril (PRINIVIL,ZESTRIL) 40 MG tablet    Sig: Take 1 tablet (40 mg total) by mouth daily.    Dispense:  90 tablet    Refill:  1  . hydrochlorothiazide (MICROZIDE) 12.5 MG capsule    Sig: Take 1 capsule (12.5 mg total) by mouth daily.    Dispense:  90 capsule    Refill:  1  . amLODipine (NORVASC) 10 MG tablet    Sig: Take 1 tablet (10 mg total) by mouth daily.    Dispense:  90 tablet    Refill:  1  . DULoxetine (CYMBALTA) 30 MG capsule    Sig: Take 1 capsule (30 mg total) by mouth 2 (two) times daily.    Dispense:  60 capsule    Refill:  1   Patient Instructions   Blood pressure still running too high. Increase the amlodipine to 10mg  per day, no change in other meds for  now. If any leg swelling lightheadedness or new side effects - return to discuss symptoms further.   We will clarify the plan from ortho as those shots have helped.  If long term hydrocodone is needed, you will need to meet with pain management.   Increase the duloxetine to twice per day to see if that helps pain and depressions symptoms. 1-2 hydroxyzine at bedtime if needed.   Keep plan for imaging for possible carotid aneurysms, but if any increasing pain in that area, lightheadedness or dizziness - go to emergency room.   Recheck in 1 month.   Return to the clinic or go to the nearest emergency room if any of your symptoms worsen or new symptoms occur.    IF you received an x-ray today, you will receive an invoice from [facility]. Please contact [facility] at [phone] with questions or concerns regarding your invoice.   IF you received labwork today, you will receive an invoice from [facility]. Please contact [facility] at [phone] with questions or concerns regarding your invoice.   Our billing staff will not be able to assist you with questions regarding bills from these companies.  You will be contacted with the lab results as soon as they are available. The fastest way to get your results is to activate your My Chart account. Instructions are located on the last page of this paperwork. If you have not heard from [place] regarding the results in 2 weeks, please  contact this office.       I personally performed the services described in this documentation, which was scribed in my presence. The recorded information has been reviewed and considered for accuracy and completeness, addended by me as needed, and agree with information above.  Signed,   Merri Ray, MD Primary Care at [place].  10/21/17 12:38 PM

## 2017-10-20 ENCOUNTER — Telehealth: Payer: Self-pay | Admitting: Family Medicine

## 2017-10-20 NOTE — Telephone Encounter (Signed)
Please provide

## 2017-10-20 NOTE — Telephone Encounter (Signed)
Copied from [place] [id number]. Topic: Inquiry >> Oct 20, 2017  2:43 PM Oliver Pila B wrote: Reason for CRM: pt called and states that he lost all of his identification and is needing a signed written statement for the Social Security Office to state that he is a pt of Dr. Carlota Raspberry, contact pt to advise asap

## 2017-10-21 ENCOUNTER — Encounter: Payer: Self-pay | Admitting: Family Medicine

## 2017-10-21 NOTE — Telephone Encounter (Signed)
I have no idea what to do with this... What should we do?

## 2017-10-24 NOTE — Telephone Encounter (Signed)
We could write a letter on the letter head stating that he is a patient of Dr Carlota Raspberry and have Dr Carlota Raspberry sign it and either mail it to the patient or let them come pick it up.

## 2017-10-25 ENCOUNTER — Telehealth: Payer: Self-pay | Admitting: Family Medicine

## 2017-10-25 NOTE — Telephone Encounter (Signed)
No problem. Is there a certain code or procedure they would like me to order?  I usually refer to [facility] neuro and then they order the specific testing, but he has been dismissed from that practice. Thanks for your help.

## 2017-10-25 NOTE — Telephone Encounter (Signed)
Pt has referral in for sleep study to [facility]. They are unable to take referrals, but rather need an order placed for this. Can we get this switched? Thanks!

## 2017-10-25 NOTE — Telephone Encounter (Signed)
I am unsure of the order, but I did leave a message at [place] to see if someone could tell me what code to order. I also went ahead and routed the referral to [facility] on [address] because they too can do sleep studies from referrals. If we still want to do this at [facility], I can cancel referral to [facility] and will place a message once I hear what code to order for [facility]. Thanks!

## 2017-10-25 NOTE — Telephone Encounter (Unsigned)
Copied from [place]. Topic: Quick Communication - See Telephone Encounter >> Oct 25, 2017  4:03 PM Neva Seat wrote: [facility] -  [phone]  Needing to speak to Deneise Lever there in the office.  Please call them back.

## 2017-10-26 ENCOUNTER — Ambulatory Visit
Admission: RE | Admit: 2017-10-26 | Discharge: 2017-10-26 | Disposition: A | Discharge status: Home / Self Care | Payer: Medicare Other | Source: Ambulatory Visit | Attending: Surgery | Admitting: Surgery

## 2017-10-26 DIAGNOSIS — I720 Aneurysm of carotid artery: Secondary | ICD-10-CM

## 2017-10-26 DIAGNOSIS — R221 Localized swelling, mass and lump, neck: Secondary | ICD-10-CM

## 2017-10-26 DIAGNOSIS — Z01812 Encounter for preprocedural laboratory examination: Secondary | ICD-10-CM

## 2017-10-26 DIAGNOSIS — I6930 Unspecified sequelae of cerebral infarction: Secondary | ICD-10-CM

## 2017-10-26 MED ORDER — IOPAMIDOL (ISOVUE-370) INJECTION 76%
75.0000 mL | Freq: Once | INTRAVENOUS | Status: AC | PRN
  Administered 2017-10-26: 75 mL via INTRAVENOUS

## 2017-10-26 NOTE — Telephone Encounter (Signed)
[facility] is great. Thanks.

## 2017-10-26 NOTE — Telephone Encounter (Signed)
Spoke with [facility] and they said a split night study could be done or a diagnostic, but split night is the most common. It does look like [facility] has already reached out to pt and left vm, so if it okay to stick with the referral for sleep study with them, we will not need order for [employer].

## 2017-10-27 ENCOUNTER — Ambulatory Visit
Admission: RE | Admit: 2017-10-27 | Discharge: 2017-10-27 | Disposition: A | Discharge status: Home / Self Care | Payer: Medicare Other | Source: Ambulatory Visit | Attending: Orthopaedic Surgery | Admitting: Orthopaedic Surgery

## 2017-10-27 DIAGNOSIS — M25552 Pain in left hip: Secondary | ICD-10-CM

## 2017-10-31 ENCOUNTER — Ambulatory Visit: Payer: Medicare Other | Admitting: Surgery

## 2017-11-15 ENCOUNTER — Ambulatory Visit (INDEPENDENT_AMBULATORY_CARE_PROVIDER_SITE_OTHER): Payer: Medicare Other | Admitting: Orthopaedic Surgery

## 2017-11-18 ENCOUNTER — Telehealth (INDEPENDENT_AMBULATORY_CARE_PROVIDER_SITE_OTHER): Payer: Self-pay | Admitting: Orthopaedic Surgery

## 2017-11-18 ENCOUNTER — Ambulatory Visit (INDEPENDENT_AMBULATORY_CARE_PROVIDER_SITE_OTHER): Payer: Medicare Other | Admitting: Orthopaedic Surgery

## 2017-11-18 NOTE — Telephone Encounter (Signed)
Returned call to patient left message for call back

## 2017-11-21 ENCOUNTER — Encounter (INDEPENDENT_AMBULATORY_CARE_PROVIDER_SITE_OTHER): Payer: Self-pay | Admitting: Orthopaedic Surgery

## 2017-11-21 ENCOUNTER — Ambulatory Visit (INDEPENDENT_AMBULATORY_CARE_PROVIDER_SITE_OTHER): Payer: Medicare Other

## 2017-11-21 ENCOUNTER — Ambulatory Visit (INDEPENDENT_AMBULATORY_CARE_PROVIDER_SITE_OTHER): Payer: Medicare Other | Admitting: Orthopaedic Surgery

## 2017-11-21 DIAGNOSIS — M25512 Pain in left shoulder: Secondary | ICD-10-CM

## 2017-11-21 DIAGNOSIS — M75102 Unspecified rotator cuff tear or rupture of left shoulder, not specified as traumatic: Secondary | ICD-10-CM | POA: Insufficient documentation

## 2017-11-21 MED ORDER — LIDOCAINE HCL 2 % IJ SOLN
2.0000 mL | INTRAMUSCULAR | Status: AC | PRN
  Administered 2017-11-21: 2 mL

## 2017-11-21 MED ORDER — BUPIVACAINE HCL 0.25 % IJ SOLN
2.0000 mL | INTRAMUSCULAR | Status: AC | PRN
  Administered 2017-11-21: 2 mL via INTRA_ARTICULAR

## 2017-11-21 MED ORDER — METHYLPREDNISOLONE ACETATE 40 MG/ML IJ SUSP
40.0000 mg | INTRAMUSCULAR | Status: AC | PRN
  Administered 2017-11-21: 40 mg via INTRA_ARTICULAR

## 2017-11-21 NOTE — Progress Notes (Signed)
Office Visit Note   Patient: [name]           Date of Birth: [birth year]           MRN: [id number] Visit Date: 11/21/2017              Requested by: [name], MD [address] [place], [place] [address] PCP: [name], MD   Assessment & Plan: Visit Diagnoses:  1. Acute pain of left shoulder     Plan: Impression is left shoulder rotator cuff tendinitis, bilateral hip arthritis and lumbar radiculopathy.  In regards to the left shoulder, we will inject the subacromial space with cortisone.  We will also set him up for an epidural steroid injection.  He will follow-up with [place] on an as-needed basis.  Follow-Up Instructions: Return if symptoms worsen or fail to improve.   Orders:  Orders Placed This Encounter  Procedures  . Large Joint Inj: L subacromial bursa  . XR Shoulder Left   No orders of the defined types were placed in this encounter.     Procedures: Large Joint Inj: L subacromial bursa on 11/21/2017 11:07 AM Indications: pain Details: 22 G needle Medications: 2 mL lidocaine 2 %; 2 mL bupivacaine 0.25 %; 40 mg methylPREDNISolone acetate 40 MG/ML Outcome: tolerated well, no immediate complications Patient was prepped and draped in the usual sterile fashion.       Clinical Data: No additional findings.   Subjective: Chief Complaint  Patient presents with  . Left Hip - Follow-up    S/p MRI  . Lower Back - Follow-up    S/p MRI  . Left Shoulder - Pain    HPI patient is a pleasant 67 year old gentleman who presents to our clinic today with left shoulder pain.  This began approximately 4 to 5 months ago without any known injury or change in activity.  He describes this as a constant toothache which is progressively worsened.  Pain is worse with any motion of the shoulder as well as when he tries to sleep on the left side.  No previous injury or cortisone injection.  He is also here to discuss MRI results of his left hip and lumbar spine.   MRI results of his left hip show moderate to advanced bilateral hip degenerative changes.  He has had a left hip intra-articular cortisone injection which did not give him long-standing relief.  MRI of the lumbar spine reveals disc herniation at L3-4 with moderate to severe bilateral foraminal stenosis L4-5 and L5 1.  He has not had epidural steroid injections in the past.  Review of Systems as detailed in HPI.  All others reviewed and are negative.   Objective: Vital Signs: There were no vitals taken for this visit.  Physical Exam well-developed and well-nourished gentleman no acute distress.  Alert and oriented x3.  Ortho Exam examination of his left shoulder reveals 50% active range of motion in all planes.  Markedly positive empty can.  Stable hip and lumbar spine exam  Specialty Comments:  No specialty comments available.  Imaging: Xr Shoulder Left  Result Date: 11/21/2017 X-rays show mild to moderate before meals and glenohumeral degenerative changes    PMFS History: Patient Active Problem List   Diagnosis Date Noted  . Acute pain of left shoulder 11/21/2017  . Anemia, iron deficiency   . Gastrointestinal bleeding 10/24/2015  . Absolute anemia   . Tobacco abuse   . Acute ischemic stroke ([place])   . Acute  CVA (cerebrovascular accident) ([place]) 05/31/2015  . Stroke ([place]) 05/31/2015  . Dyspnea 04/10/2014  . HTN (hypertension) 04/10/2014  . Inguinal hernia without mention of obstruction or gangrene, unilateral or unspecified, (not specified as recurrent)-right 01/30/2014   Past Medical History:  Diagnosis Date  . Arthritis   . Chronic back pain   . GERD (gastroesophageal reflux disease)    uses Baking Soda  . GIB (gastrointestinal bleeding) 2012  . Glaucoma    right eye  . Headache    occasionally  . Hepatitis C   . History of blood transfusion    no abnormal reaction noted  . History of gout    doesn't take any meds  . Hyperlipidemia    not on any meds  .  Hypertension    takes Amlodipine and Lisinopril daily  . Insomnia    takes Ambien nightly  . Ischemic colitis ([place]) 2012  . Joint pain   . Nocturia   . Numbness    both legs occasionally  . PONV (postoperative nausea and vomiting)   . Prostate cancer ([place])   . Shortness of breath dyspnea    rarely but when notices he can be lying/sitting/exertion.Dr.Hochrein is aware per pt  . Stroke ([place]) 2016   . Urinary frequency   . Urinary urgency     Family History  Problem Relation Age of Onset  . Alzheimer's disease Father   . Cancer Mother        Lymph node  . Heart failure Brother 45       Transplant 13 years ago  . CAD Brother   . Heart disease Sister 62       MI    Past Surgical History:  Procedure Laterality Date  . APPENDECTOMY    . COLONOSCOPY    . COLONOSCOPY N/A 10/26/2015   Procedure: COLONOSCOPY;  Surgeon: Doran Stabler, MD;  Location: [facility] ENDOSCOPY;  Service: Endoscopy;  Laterality: N/A;  . ESOPHAGOGASTRODUODENOSCOPY    . ESOPHAGOGASTRODUODENOSCOPY N/A 10/26/2015   Procedure: ESOPHAGOGASTRODUODENOSCOPY (EGD);  Surgeon: Doran Stabler, MD;  Location: [facility] ENDOSCOPY;  Service: Endoscopy;  Laterality: N/A;  . INGUINAL HERNIA REPAIR Right 11/04/2014   Procedure: RIGHT INGUINAL HERNIA REPAIR WITH MESH;  Surgeon: Jackolyn Confer, MD;  Location: [place];  Service: General;  Laterality: Right;  . MASS EXCISION N/A 11/04/2014   Procedure: REMOVAL OF RIGHT GROIN SOFT TISSUE MASS;  Surgeon: Jackolyn Confer, MD;  Location: [place];  Service: General;  Laterality: N/A;  . Multiple abdominal surgeries     total of 13  . PROSTATECTOMY    . SMALL INTESTINE SURGERY     Social History   Occupational History  . Not on file  Tobacco Use  . Smoking status: Current Every Day Smoker    Packs/day: 0.50    Years: 50.00    Pack years: 25.00    Types: Cigarettes  . Smokeless tobacco: Never Used  Substance and Sexual Activity  . Alcohol use: Yes    Alcohol/week: 0.0 oz    Comment:  occasionally  . Drug use: No    Frequency: 5.0 times per week  . Sexual activity: Yes

## 2017-11-22 ENCOUNTER — Ambulatory Visit: Payer: Medicare Other | Admitting: Family Medicine

## 2017-12-01 ENCOUNTER — Other Ambulatory Visit: Payer: Self-pay

## 2017-12-01 ENCOUNTER — Encounter: Payer: Self-pay | Admitting: Family Medicine

## 2017-12-01 ENCOUNTER — Ambulatory Visit (INDEPENDENT_AMBULATORY_CARE_PROVIDER_SITE_OTHER): Payer: Medicare Other | Admitting: Family Medicine

## 2017-12-01 VITALS — BP 166/88 | HR 82 | Temp 97.8°F | Ht 69.5 in | Wt 194.6 lb

## 2017-12-01 DIAGNOSIS — G4700 Insomnia, unspecified: Secondary | ICD-10-CM | POA: Diagnosis not present

## 2017-12-01 DIAGNOSIS — G894 Chronic pain syndrome: Secondary | ICD-10-CM

## 2017-12-01 DIAGNOSIS — I10 Essential (primary) hypertension: Secondary | ICD-10-CM

## 2017-12-01 MED ORDER — HYDROCODONE-ACETAMINOPHEN 5-325 MG PO TABS: 1.0000 | ORAL_TABLET | Freq: Four times a day (QID) | ORAL | 0 refills | Status: DC | PRN

## 2017-12-01 NOTE — Progress Notes (Addendum)
Subjective:  By signing my name below, I, [name], attest that this documentation has been prepared under the direction and in the presence of [name], MD Electronically Signed: Ladene Artist, ED Scribe 12/01/2017 at 9:45 AM.   Patient ID: [name], male    DOB: 09/05/1950, 67 y.o.   MRN: [id number]  Chief Complaint  Patient presents with  . Generalized Body Aches    hips and shoulder pain follow up   . sleep problems    cant sleep in 4 months    HPI [name] is a 67 y.o. male who presents to Primary Care at [facility] for multiple concerns.  Arthralgias Followed by [name]. Thought to have component of hip and back pain with sciatica. Started Cymbalta in Feb for poss reactive depression and chronic pain. He was receiving injections that were helpful when discussed 4/2. Recommend he increased Cymbalta to 30 mg bid at last OV. Also been having L shoulder pain, seen 5/6 by [name]. On review of note, had L shoulder cuff tendinitis, bilateral hip arthritis and lumbar radiculopathy. He was given subacromial injection and was being set up for epidural steroid injection. - Pt states he has had hip injections in the past but not back injections. Reports he sleeps with a heating pad on his hip and back nightly which provides minimal relief but he still has pain. Also reports ongoing L knee pain. He reports that he has been taking 1 tab Cymbalta bid since last visit without any relief. States he last took a previously prescribed Norco 7.5-325 mg tab yesterday morning which he takes once daily with some relief. Denies taking Advil, Aleve or any other OTC meds for pain.  Insomnia Discussed at multiple previous visits. Referred to sleep specialist on past but no showed multiple visits and was ultimately dismissed. He was referred to other sleep specialist. Referred again 4/2 to other sleep specialist. Appears they left message on his voicemail on 4/9 to call back and schedule  sleep consult at [facility]. He is unaware of this message. Pt states that he has "watched the sun rise and fall" for the past 4 months. States he occasionally takes 20 minute naps and doesn't even realize it. Denies SI/HI, hallucinations, alcohol or illicit drug use. Recommended he increase hydroxyzine to 2 qhs prn at last visit. . Pt has not tried taking a second hydroxyzine tab at night; denies side-effects from medication.   HTN Seen 4/2, BP was 180/70 at that time. Recommended that pt increase amlodipine to 10 mg qd. - Pt states that he has not picked up amlodipine due to cost. He is taking Lisinopril 40 mg qd, HCTZ 12.5mg  qd and continued amlodipine 5 mg qd which he states he recently ran out of. He does not have a home BP monitor. Denies any symptoms.  Possible Carotid Artery Aneurysm Pt was evaluated by vascular Dr. Trula Slade on 3/22 with CT angio head performed on 4/10, with tortuosity without aneurysm. Has not head back from vascular.   Weight Loss Pt also reports weight loss that he plans to return to discuss further. Wt Readings from Last 3 Encounters:  12/01/17 194 lb 9.6 oz (88.3 kg)  10/18/17 212 lb 3.2 oz (96.3 kg)  10/07/17 209 lb (94.8 kg)   States he has an appointment scheduled with [facility] as well for 6/13.  Patient Active Problem List   Diagnosis Date Noted  . Acute pain of left shoulder 11/21/2017  .  Anemia, iron deficiency   . Gastrointestinal bleeding 10/24/2015  . Absolute anemia   . Tobacco abuse   . Acute ischemic stroke ([place])   . Acute CVA (cerebrovascular accident) ([place]) 05/31/2015  . Stroke ([place]) 05/31/2015  . Dyspnea 04/10/2014  . HTN (hypertension) 04/10/2014  . Inguinal hernia without mention of obstruction or gangrene, unilateral or unspecified, (not specified as recurrent)-right 01/30/2014   Past Medical History:  Diagnosis Date  . Arthritis   . Chronic back pain   . GERD (gastroesophageal reflux disease)    uses Baking Soda    . GIB (gastrointestinal bleeding) 2012  . Glaucoma    right eye  . Headache    occasionally  . Hepatitis C   . History of blood transfusion    no abnormal reaction noted  . History of gout    doesn't take any meds  . Hyperlipidemia    not on any meds  . Hypertension    takes Amlodipine and Lisinopril daily  . Insomnia    takes Ambien nightly  . Ischemic colitis ([place]) 2012  . Joint pain   . Nocturia   . Numbness    both legs occasionally  . PONV (postoperative nausea and vomiting)   . Prostate cancer ([place])   . Shortness of breath dyspnea    rarely but when notices he can be lying/sitting/exertion.Dr.Hochrein is aware per pt  . Stroke ([place]) 2016   . Urinary frequency   . Urinary urgency    Past Surgical History:  Procedure Laterality Date  . APPENDECTOMY    . COLONOSCOPY    . COLONOSCOPY N/A 10/26/2015   Procedure: COLONOSCOPY;  Surgeon: Doran Stabler, MD;  Location: [facility] ENDOSCOPY;  Service: Endoscopy;  Laterality: N/A;  . ESOPHAGOGASTRODUODENOSCOPY    . ESOPHAGOGASTRODUODENOSCOPY N/A 10/26/2015   Procedure: ESOPHAGOGASTRODUODENOSCOPY (EGD);  Surgeon: Doran Stabler, MD;  Location[facility] END[facility]COPY;  Service: Endoscopy;  Laterality: N/A;  . INGUINAL HERNIA REPAIR Right 11/04/2014   Procedure: RIGHT INGUINAL HERNIA REPAIR WITH MESH;  Surgeon: Jackolyn Confer, MD;  Location: [facility];  Service: General;  Laterality: Right;  . MASS EXCISION N/A 11/04/2014   Procedure: REMOVAL OF RIGHT GROIN SOFT TISSUE MASS;  Surgeon: Jackolyn Confer, MD;  Location: [facility];  Service: General;  Laterality: N/A;  . Multiple abdominal surgeries     total of 13  . PROSTATECTOMY    . SMALL INTESTINE SURGERY     Allergies  Allergen Reactions  . Penicillins Anaphylaxis    Has patient had a PCN reaction causing immediate rash, facial/tongue/throat swelling, SOB or lightheadedness with hypotension: Yes Has patient had a PCN reaction causing severe rash involving mucus membranes or skin necrosis:  Unk Has patient had a PCN reaction that required hospitalization: Unk Has patient had a PCN reaction occurring within the last 10 years: No If all of the above answers are "NO", then may proceed with Cephalosporin use..  . Pseudoephedrine Hcl   . Sudafed [Pseudoephedrine] Other (See Comments)    Heart "races"  . Trazodone And Nefazodone Swelling   Prior to Admission medications   Medication Sig Start Date End Date Taking? Authorizing Provider  acetaminophen (TYLENOL) 325 MG tablet Take 2 tablets (650 mg total) by mouth every 6 (six) hours as needed for mild pain or moderate pain. 07/26/17   Waynetta Pean, PA-C  amLODipine (NORVASC) 10 MG tablet Take 1 tablet (10 mg total) by mouth daily. 10/18/17   [name], MD  Diclofenac Sodium 3 %  GEL Place 1 application onto the skin 2 (two) times daily. To affected area. 07/26/17   Waynetta Pean, PA-C  DULoxetine (CYMBALTA) 30 MG capsule Take 1 capsule (30 mg total) by mouth 2 (two) times daily. 10/18/17   [name], MD  hydrochlorothiazide (MICROZIDE) 12.5 MG capsule Take 1 capsule (12.5 mg total) by mouth daily. 10/18/17   [name], MD  HYDROcodone-acetaminophen (NORCO) 7.5-325 MG tablet Take 1 tablet by mouth every 6 (six) hours as needed for moderate pain. For chronic pain 08/29/17   [name], MD  hydrOXYzine (ATARAX/VISTARIL) 25 MG tablet Take 1-2 tablets (25-50 mg total) by mouth at bedtime as needed (sleep). 08/29/17   [name], MD  lisinopril (PRINIVIL,ZESTRIL) 40 MG tablet Take 1 tablet (40 mg total) by mouth daily. 10/18/17   [name], MD   Social History   Socioeconomic History  . Marital status: Legally Separated    Spouse name: Not on file  . Number of children: 2  . Years of education: Not on file  . Highest education level: Not on file  Occupational History  . Not on file  Social Needs  . Financial resource strain: Not on file  . Food insecurity:    Worry: Not on file    Inability: Not  on file  . Transportation needs:    Medical: Not on file    Non-medical: Not on file  Tobacco Use  . Smoking status: Current Every Day Smoker    Packs/day: 0.50    Years: 50.00    Pack years: 25.00    Types: Cigarettes  . Smokeless tobacco: Never Used  Substance and Sexual Activity  . Alcohol use: Yes    Alcohol/week: 0.0 oz    Comment: occasionally  . Drug use: No    Frequency: 5.0 times per week  . Sexual activity: Yes  Lifestyle  . Physical activity:    Days per week: Not on file    Minutes per session: Not on file  . Stress: Not on file  Relationships  . Social connections:    Talks on phone: Not on file    Gets together: Not on file    Attends religious service: Not on file    Active member of club or organization: Not on file    Attends meetings of clubs or organizations: Not on file    Relationship status: Not on file  . Intimate partner violence:    Fear of current or ex partner: Not on file    Emotionally abused: Not on file    Physically abused: Not on file    Forced sexual activity: Not on file  Other Topics Concern  . Not on file  Social History Narrative   One living child .  One murdered.  Lives with girlfriend.     Review of Systems  Constitutional: Positive for unexpected weight change. Negative for fatigue.  Eyes: Negative for visual disturbance.  Respiratory: Negative for cough, chest tightness and shortness of breath.   Cardiovascular: Negative for chest pain, palpitations and leg swelling.  Gastrointestinal: Negative for abdominal pain and blood in stool.  Musculoskeletal: Positive for arthralgias and back pain.  Neurological: Negative for dizziness, light-headedness and headaches.  Psychiatric/Behavioral: Positive for sleep disturbance. Negative for hallucinations and suicidal ideas.      Objective:   Physical Exam  Constitutional: He is oriented to person, place, and time. He appears well-developed and well-nourished.  HENT:  Head:  Normocephalic and atraumatic.  Eyes: Pupils are equal, round, and reactive to light. EOM are normal.  Neck: No JVD present. Carotid bruit is not present.  Cardiovascular: Normal rate, regular rhythm and normal heart sounds.  No murmur heard. Pulmonary/Chest: Effort normal and breath sounds normal. He has no rales.  Musculoskeletal: He exhibits no edema.  Neurological: He is alert and oriented to person, place, and time.  Skin: Skin is warm and dry.  Psychiatric: He has a normal mood and affect.  Vitals reviewed.  Vitals:   12/01/17 0933 12/01/17 0938  BP: (!) 180/74 (!) 166/88  Pulse: 82   Temp: 97.8 F (36.6 C)   TempSrc: Oral   SpO2: 100%   Weight: 194 lb 9.6 oz (88.3 kg)   Height: 5' 9.5" (1.765 m)       Assessment & Plan:   [name] is a 67 y.o. male Chronic pain syndrome - Plan: Ambulatory referral to [facility], Ambulatory referral to Pain Clinic, HYDROcodone-acetaminophen (NORCO/VICODIN) 5-325 MG tablet  -Multiple areas of arthralgias, followed by orthopedics for hip and back issues, and has been treated with injection for shoulder with rotator cuff tendinitis.  -Due to persistent pain will refer to pain management as likely will need to be on daily medication.  I have tried to treat his pain with Cymbalta, ultimately increasing to 60 mg dose which she is currently tolerating.  Did agree to write for hydrocodone for now for breakthrough pain and follow-up in 2 weeks to determine control on that medication.  Potential side effects and risks of narcotic pain medicines were discussed including caution with other sedating medication.  Insomnia, unspecified type  Long-standing symptoms.  I have tried to have him evaluated by sleep specialist in the past but unfortunately was dismissed from initial practice due to no shows but has been referred to other practice.  He reports he did not receive any information from that practice so I provided them their phone number as  it appears they did try to call and schedule appointment.  Stressed importance of follow-up with sleep specialist.   -Additionally I did try to have him increase hydroxyzine last visit but had not done so.  Encouraged to try to hydroxyzine at higher dose for now.  -  He had been taking Ambien in the past and ultimately could consider Ambien again but at his age and with other medications I would be cautious in restarting that medicine if not necessary.  Essential hypertension - Plan: Ambulatory referral to [facility]  -Unfortunately still at 5 mg amlodipine, did not try higher dose.  I will place referral to [facility] connected to care to see if there are any options to help him with cost of medications as he identified the cost of amlodipine as a barrier, but that should be very inexpensive given generic.   -May have some component of pain with increased pressure.  Pain treatment as above, monitor home readings with repeat evaluation in 2 weeks, hopefully add amlodipine 10 mg at that time.  RTC precautions if any worsening symptoms sooner.  Did discuss weight loss briefly, but plans to follow-up within 2 weeks to discuss further.  If any new/worsening symptoms prior to that time, advised to follow-up sooner or ER if needed.   Meds ordered this encounter  Medications  . HYDROcodone-acetaminophen (NORCO/VICODIN) 5-325 MG tablet    Sig: Take 1 tablet by mouth every 6 (six) hours as needed for moderate pain.    Dispense:  30 tablet  Refill:  0   Patient Instructions   For shoulder pain, it may take a little more time to see if the injection will work. I am also hoping that the other injection will be covered for your back issues. I will write for a temporary pain medication for now until we can have you be seen by pain management. Be careful combining that medication with other sedating medications like the hydroxyzine at night. Ok to remain on duloxetine at same dose for now.   I again  recommend trying two of the hydroxyzine tonight to see if that will help you get to sleep. You were referred to another sleep specialist - [facility], and it appears they tried to call you.  Here is their info- please call them to schedule appointment.   For blood pressure - increase amlodpine to 10 mg per day - that prescription was sent to your pharmacy. Let me know if that needs to be resent. Continue lisinopril, hydrocholorothiazide at same dose for now.   [facility] (for sleep specialist) Pulmonologist in [place], [place] Phone: [phone]  It does not appear that there was any aneurysm on your neck vein study, but those results can be discussed further with vascular specialist Dr. Trula Slade.  Address: [address] [place], [place] [address]  Phone: [phone]  Please recheck in 2 weeks to evaluate changes in sleep, pain medication and blood pressure. I would like to check blood work at that visit as well. We can recheck weight at that time or I am happy to see you sooner to discuss weight changes if needed.   Return to the clinic or go to the nearest emergency room if any of your symptoms worsen or new symptoms occur.     IF you received an x-ray today, you will receive an invoice from [facility]. Please contact [facility] at [phone] with questions or concerns regarding your invoice.   IF you received labwork today, you will receive an invoice from [facility]. Please contact [facility] at [phone] with questions or concerns regarding your invoice.   Our billing staff will not be able to assist you with questions regarding bills from these companies.  You will be contacted with the lab results as soon as they are available. The fastest way to get your results is to activate your My Chart account. Instructions are located on the last page of this paperwork. If you have not heard from [place] regarding the results in 2 weeks,  please contact this office.       I personally performed the services described in this documentation, which was scribed in my presence. The recorded information has been reviewed and considered for accuracy and completeness, addended by me as needed, and agree with information above.  Signed,   Merri Ray, MD Primary Care at [facility].  12/02/17 8:08 AM

## 2017-12-01 NOTE — Patient Instructions (Addendum)
For shoulder pain, it may take a little more time to see if the injection will work. I am also hoping that the other injection will be covered for your back issues. I will write for a temporary pain medication for now until we can have you be seen by pain management. Be careful combining that medication with other sedating medications like the hydroxyzine at night. Ok to remain on duloxetine at same dose for now.   I again recommend trying two of the hydroxyzine tonight to see if that will help you get to sleep. You were referred to another sleep specialist - [facility], and it appears they tried to call you.  Here is their info- please call them to schedule appointment.   For blood pressure - increase amlodpine to 10 mg per day - that prescription was sent to your pharmacy. Let me know if that needs to be resent. Continue lisinopril, hydrocholorothiazide at same dose for now.   [facility] (for sleep specialist) Pulmonologist in [place], [place] Phone: [phone]  It does not appear that there was any aneurysm on your neck vein study, but those results can be discussed further with vascular specialist Dr. Trula Slade.  Address: [address] [place], [place] [address]  Phone: [phone]  Please recheck in 2 weeks to evaluate changes in sleep, pain medication and blood pressure. I would like to check blood work at that visit as well. We can recheck weight at that time or I am happy to see you sooner to discuss weight changes if needed.   Return to the clinic or go to the nearest emergency room if any of your symptoms worsen or new symptoms occur.     IF you received an x-ray today, you will receive an invoice from [facility]. Please contact [facility] at [phone] with questions or concerns regarding your invoice.   IF you received labwork today, you will receive an invoice from [facility]. Please contact [facility] at [phone] with  questions or concerns regarding your invoice.   Our billing staff will not be able to assist you with questions regarding bills from these companies.  You will be contacted with the lab results as soon as they are available. The fastest way to get your results is to activate your My Chart account. Instructions are located on the last page of this paperwork. If you have not heard from [place] regarding the results in 2 weeks, please contact this office.

## 2017-12-04 ENCOUNTER — Encounter: Payer: Self-pay | Admitting: Family Medicine

## 2017-12-15 ENCOUNTER — Ambulatory Visit: Payer: Medicare Other | Admitting: Family Medicine

## 2017-12-16 ENCOUNTER — Telehealth: Payer: Self-pay | Admitting: Family Medicine

## 2017-12-16 NOTE — Telephone Encounter (Signed)
Copied from [place] [id number]. Topic: Referral - Question >> Dec 16, 2017 10:22 AM Conception Chancy, NT wrote: Reason for CRM: Inez Catalina is calling from [facility] in regards to a referral they received for the patient. She states she needs to know by Dr. Carlota Raspberry if he is still seeing [name]. She states it looks like he gets injections from [name] and if so then they would not be able to take him as a new patient because they offer injections and would have to take over all that care. Please contact Betty.   Inez Catalina [phone] option [phone] -----------------------------------------------------------------------------------------  Please advise. Looks like pt just saw [name] on 11/21/17. Is he still getting injections from him?

## 2017-12-16 NOTE — Telephone Encounter (Signed)
Tried calling pt to ask him if he still received injections from [name] but received vm

## 2017-12-20 ENCOUNTER — Ambulatory Visit: Payer: Medicare Other | Admitting: Family Medicine

## 2017-12-23 ENCOUNTER — Ambulatory Visit: Payer: Medicare Other | Admitting: Family Medicine

## 2017-12-25 NOTE — Telephone Encounter (Signed)
Please try calling again to check to see if he is planning on still receiving injections from [name] or if not can transition to pain management.  Planned to discuss this at his office visit scheduled last week, but did not show.

## 2017-12-27 NOTE — Telephone Encounter (Signed)
Left a detailed message with patient

## 2017-12-27 NOTE — Telephone Encounter (Signed)
[name] with [facility] called to find out if pt will continue seeing [name]. If he does, they will not see him thru pain mgmt. Advised her that we have left msgs for pt to call us back and let us know if he intends on seeing [name] further.

## 2017-12-29 ENCOUNTER — Ambulatory Visit: Payer: Medicare Other | Admitting: Adult Health

## 2017-12-29 NOTE — Progress Notes (Deleted)
Patient presents to clinic today to establish care. He is a pleasant 67 year old male who  has a past medical history of Arthritis, Chronic back pain, GERD (gastroesophageal reflux disease), GIB (gastrointestinal bleeding) (2012), Glaucoma, Headache, Hepatitis C, History of blood transfusion, History of gout, Hyperlipidemia, Hypertension, Insomnia, Ischemic colitis ([place]) (2012), Joint pain, Nocturia, Numbness, PONV (postoperative nausea and vomiting), Prostate cancer ([place]), Shortness of breath dyspnea, Stroke ([place]) (2016 ), Urinary frequency, and Urinary urgency.  He is a former patient of Dr. Merri Ray   Acute Concerns: Establish Care  Chronic Issues: Hypertension - Prescribed Norvasc 10 mg, lisinopril 40 mg, and hydrochlorothiazide 12.5 mg daily. BP Readings from Last 3 Encounters:  12/01/17 (!) 166/88  10/18/17 (!) 180/70  10/07/17 (!) 167/81     H/o Prostate Caner- had Prostectomy   Chronic Pain Syndrome -is followed by [name].  His pain is thought to be a component of hip and back pain with sciatica.  He was started on Cymbalta in February for possible reactive depression and chronic pain.  He was also receiving injections that were helpful.  Currently prescribed Norco 11/19/2023.  Per notes in epic it looks like he was referred to pain management  Insomnia -Per notes in epic patient was referred to sleep specialist in the past but no showed multiple visits and was ultimately dismissed.  He was referred to another sleep specialist on 4 2 appears that they left a message on his voicemail for 9 to call back and schedule a sleep consult with the [facility] pulmonary.  Health Maintenance: Dental -- Vision -- Immunizations -- Colonoscopy --    Past Medical History:  Diagnosis Date  . Arthritis   . Chronic back pain   . GERD (gastroesophageal reflux disease)    uses Baking Soda  . GIB (gastrointestinal bleeding) 2012  . Glaucoma    right eye  . Headache    occasionally  .  Hepatitis C   . History of blood transfusion    no abnormal reaction noted  . History of gout    doesn't take any meds  . Hyperlipidemia    not on any meds  . Hypertension    takes Amlodipine and Lisinopril daily  . Insomnia    takes Ambien nightly  . Ischemic colitis ([place]) 2012  . Joint pain   . Nocturia   . Numbness    both legs occasionally  . PONV (postoperative nausea and vomiting)   . Prostate cancer ([place])   . Shortness of breath dyspnea    rarely but when notices he can be lying/sitting/exertion.Dr.Hochrein is aware per pt  . Stroke ([place]) 2016   . Urinary frequency   . Urinary urgency     Past Surgical History:  Procedure Laterality Date  . APPENDECTOMY    . COLONOSCOPY    . COLONOSCOPY N/A 10/26/2015   Procedure: COLONOSCOPY;  Surgeon: Doran Stabler, MD;  Location: [facility] ENDOSCOPY;  Service: Endoscopy;  Laterality: N/A;  . ESOPHAGOGASTRODUODENOSCOPY    . ESOPHAGOGASTRODUODENOSCOPY N/A 10/26/2015   Procedure: ESOPHAGOGASTRODUODENOSCOPY (EGD);  Surgeon: Doran Stabler, MD;  Location: [facility] ENDOSCOPY;  Service: Endoscopy;  Laterality: N/A;  . INGUINAL HERNIA REPAIR Right 11/04/2014   Procedure: RIGHT INGUINAL HERNIA REPAIR WITH MESH;  Surgeon: Jackolyn Confer, MD;  Location: [place];  Service: General;  Laterality: Right;  . MASS EXCISION N/A 11/04/2014   Procedure: REMOVAL OF RIGHT GROIN SOFT TISSUE MASS;  Surgeon: Jackolyn Confer, MD;  Location: [facility]  OR;  Service: General;  Laterality: N/A;  . Multiple abdominal surgeries     total of 13  . PROSTATECTOMY    . SMALL INTESTINE SURGERY      Current Outpatient Medications on File Prior to Visit  Medication Sig Dispense Refill  . acetaminophen (TYLENOL) 325 MG tablet Take 2 tablets (650 mg total) by mouth every 6 (six) hours as needed for mild pain or moderate pain. 60 tablet 0  . amLODipine (NORVASC) 10 MG tablet Take 1 tablet (10 mg total) by mouth daily. 90 tablet 1  . Diclofenac Sodium 3 % GEL Place 1 application onto the  skin 2 (two) times daily. To affected area. 100 g 0  . DULoxetine (CYMBALTA) 30 MG capsule Take 1 capsule (30 mg total) by mouth 2 (two) times daily. 60 capsule 1  . hydrochlorothiazide (MICROZIDE) 12.5 MG capsule Take 1 capsule (12.5 mg total) by mouth daily. 90 capsule 1  . HYDROcodone-acetaminophen (NORCO/VICODIN) 5-325 MG tablet Take 1 tablet by mouth every 6 (six) hours as needed for moderate pain. 30 tablet 0  . hydrOXYzine (ATARAX/VISTARIL) 25 MG tablet Take 1-2 tablets (25-50 mg total) by mouth at bedtime as needed (sleep). 30 tablet 0  . lisinopril (PRINIVIL,ZESTRIL) 40 MG tablet Take 1 tablet (40 mg total) by mouth daily. 90 tablet 1   No current facility-administered medications on file prior to visit.     Allergies  Allergen Reactions  . Penicillins Anaphylaxis    Has patient had a PCN reaction causing immediate rash, facial/tongue/throat swelling, SOB or lightheadedness with hypotension: Yes Has patient had a PCN reaction causing severe rash involving mucus membranes or skin necrosis: Unk Has patient had a PCN reaction that required hospitalization: Unk Has patient had a PCN reaction occurring within the last 10 years: No If all of the above answers are "NO", then may proceed with Cephalosporin use..  . Pseudoephedrine Hcl   . Sudafed [Pseudoephedrine] Other (See Comments)    Heart "races"  . Trazodone And Nefazodone Swelling    Family History  Problem Relation Age of Onset  . Alzheimer's disease Father   . Cancer Mother        Lymph node  . Heart failure Brother 45       Transplant 13 years ago  . CAD Brother   . Heart disease Sister 2       MI    Social History   Socioeconomic History  . Marital status: Legally Separated    Spouse name: Not on file  . Number of children: 2  . Years of education: Not on file  . Highest education level: Not on file  Occupational History  . Not on file  Social Needs  . Financial resource strain: Not on file  . Food  insecurity:    Worry: Not on file    Inability: Not on file  . Transportation needs:    Medical: Not on file    Non-medical: Not on file  Tobacco Use  . Smoking status: Current Every Day Smoker    Packs/day: 0.50    Years: 50.00    Pack years: 25.00    Types: Cigarettes  . Smokeless tobacco: Never Used  Substance and Sexual Activity  . Alcohol use: Yes    Alcohol/week: 0.0 oz    Comment: occasionally  . Drug use: No    Frequency: 5.0 times per week  . Sexual activity: Yes  Lifestyle  . Physical activity:    Days per week:  Not on file    Minutes per session: Not on file  . Stress: Not on file  Relationships  . Social connections:    Talks on phone: Not on file    Gets together: Not on file    Attends religious service: Not on file    Active member of club or organization: Not on file    Attends meetings of clubs or organizations: Not on file    Relationship status: Not on file  . Intimate partner violence:    Fear of current or ex partner: Not on file    Emotionally abused: Not on file    Physically abused: Not on file    Forced sexual activity: Not on file  Other Topics Concern  . Not on file  Social History Narrative   One living child .  One murdered.  Lives with girlfriend.      ROS  There were no vitals taken for this visit.  Physical Exam  No results found for this or any previous visit (from the past 2160 hour(s)).  Assessment/Plan: No problem-specific Assessment & Plan notes found for this encounter.

## 2018-01-05 ENCOUNTER — Ambulatory Visit (INDEPENDENT_AMBULATORY_CARE_PROVIDER_SITE_OTHER): Payer: Medicare Other | Admitting: Family Medicine

## 2018-01-05 ENCOUNTER — Encounter: Payer: Self-pay | Admitting: Family Medicine

## 2018-01-05 VITALS — BP 138/78 | HR 74 | Temp 98.3°F | Ht 68.0 in | Wt 203.0 lb

## 2018-01-05 DIAGNOSIS — Z7689 Persons encountering health services in other specified circumstances: Secondary | ICD-10-CM | POA: Diagnosis not present

## 2018-01-05 DIAGNOSIS — G8929 Other chronic pain: Secondary | ICD-10-CM

## 2018-01-05 DIAGNOSIS — K219 Gastro-esophageal reflux disease without esophagitis: Secondary | ICD-10-CM | POA: Diagnosis not present

## 2018-01-05 DIAGNOSIS — F17210 Nicotine dependence, cigarettes, uncomplicated: Secondary | ICD-10-CM | POA: Diagnosis not present

## 2018-01-05 DIAGNOSIS — M25512 Pain in left shoulder: Secondary | ICD-10-CM | POA: Diagnosis not present

## 2018-01-05 DIAGNOSIS — G4700 Insomnia, unspecified: Secondary | ICD-10-CM

## 2018-01-05 DIAGNOSIS — K449 Diaphragmatic hernia without obstruction or gangrene: Secondary | ICD-10-CM

## 2018-01-05 DIAGNOSIS — I10 Essential (primary) hypertension: Secondary | ICD-10-CM

## 2018-01-05 DIAGNOSIS — K2100 Gastro-esophageal reflux disease with esophagitis, without bleeding: Secondary | ICD-10-CM

## 2018-01-05 HISTORY — DX: Gastro-esophageal reflux disease with esophagitis, without bleeding: K21.00

## 2018-01-05 HISTORY — DX: Diaphragmatic hernia without obstruction or gangrene: K44.9

## 2018-01-05 MED ORDER — OMEPRAZOLE 20 MG PO CPDR: 20.0000 mg | DELAYED_RELEASE_CAPSULE | Freq: Every day | ORAL | 3 refills | Status: DC

## 2018-01-05 NOTE — Progress Notes (Signed)
Patient presents to clinic today to f/u on chronic concerns and establish care.  Pt is accompanied by his daughter.    SUBJECTIVE: PMH: Pt is a 67 yo male with pmh sig for HLD, CVA, HTN, chronic pain, Hep C.  Pt was formerly seen by Dr. Carlota Raspberry at [facility].  Pt also seen by the [place].  Chronic pain: -pt endorses L shoulder pain, L hip pain -pt seen by [name], [facility] -pt received injections in the past. -Has also tried  Norco 5-325 for his pain. -on cymbalta 30 mg BID -Referral to pain management made, however there was concern about pt getting injections at Ortho.  Per chart review, unsuccessful attempts at trying to reach pt.  Insomnia: -pt states he gets no sleep at all.  States " I see the sun come up" -pt has tried [place] and hydroxyzine for sleep -a referral was made for sleep study, but pt did not contact pulm. -pt states his mood is good, his energy is down, and sleep is poor. 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 -not check bp at home -taking norvasc 10 mg, HCTZ 12.5 mg, and lisinopril 40 mg daily -trying to drink more water, but having reflux after drinking.  Tobacco use: -smoking <1ppd x many years -hasn't really thought about quitting.   Past Medical History:  Diagnosis Date  . Arthritis   . Chronic back pain   . GERD (gastroesophageal reflux disease)    uses Baking Soda  . GIB (gastrointestinal bleeding) 2012  . Glaucoma    right eye  . Headache    occasionally  . Hepatitis C   . History of blood transfusion    no abnormal reaction noted  . History of gout    doesn't take any meds  . Hyperlipidemia    not on any meds  . Hypertension    takes Amlodipine and Lisinopril daily  . Insomnia    takes Ambien nightly  . Ischemic colitis ([place]) 2012  . Joint pain   . Nocturia   . Numbness    both legs occasionally  . PONV  (postoperative nausea and vomiting)   . Prostate cancer ([place])   . Shortness of breath dyspnea    rarely but when notices he can be lying/sitting/exertion.Dr.Hochrein is aware per pt  . Stroke ([place]) 2016   . Urinary frequency   . Urinary urgency     Past Surgical History:  Procedure Laterality Date  . APPENDECTOMY    . COLONOSCOPY    . COLONOSCOPY N/A 10/26/2015   Procedure: COLONOSCOPY;  Surgeon: Doran Stabler, MD;  Location: [facility] ENDOSCOPY;  Service: Endoscopy;  Laterality: N/A;  . ESOPHAGOGASTRODUODENOSCOPY    . ESOPHAGOGASTRODUODENOSCOPY N/A 10/26/2015   Procedure: ESOPHAGOGASTRODUODENOSCOPY (EGD);  Surgeon: Doran Stabler, MD;  Location: [facility] ENDOSCOPY;  Service: Endoscopy;  Laterality: N/A;  . INGUINAL HERNIA REPAIR Right 11/04/2014   Procedure: RIGHT INGUINAL HERNIA REPAIR WITH MESH;  Surgeon: Jackolyn Confer, MD;  Location: [place];  Service: General;  Laterality: Right;  . MASS EXCISION N/A 11/04/2014   Procedure: REMOVAL OF RIGHT GROIN SOFT TISSUE MASS;  Surgeon: Jackolyn Confer, MD;  Location: [place];  Service: General;  Laterality: N/A;  . Multiple abdominal surgeries     total of 13  . PROSTATECTOMY    . SMALL INTESTINE SURGERY  Current Outpatient Medications on File Prior to Visit  Medication Sig Dispense Refill  . acetaminophen (TYLENOL) 325 MG tablet Take 2 tablets (650 mg total) by mouth every 6 (six) hours as needed for mild pain or moderate pain. 60 tablet 0  . amLODipine (NORVASC) 10 MG tablet Take 1 tablet (10 mg total) by mouth daily. 90 tablet 1  . cloNIDine (CATAPRES) 0.1 MG tablet Take 0.1 mg by mouth daily.    . Diclofenac Sodium 3 % GEL Place 1 application onto the skin 2 (two) times daily. To affected area. 100 g 0  . DULoxetine (CYMBALTA) 30 MG capsule Take 1 capsule (30 mg total) by mouth 2 (two) times daily. 60 capsule 1  . hydrochlorothiazide (MICROZIDE) 12.5 MG capsule Take 1 capsule (12.5 mg total) by mouth daily. 90 capsule 1  .  HYDROcodone-acetaminophen (NORCO/VICODIN) 5-325 MG tablet Take 1 tablet by mouth every 6 (six) hours as needed for moderate pain. 30 tablet 0  . hydrOXYzine (ATARAX/VISTARIL) 25 MG tablet Take 1-2 tablets (25-50 mg total) by mouth at bedtime as needed (sleep). 30 tablet 0  . lisinopril (PRINIVIL,ZESTRIL) 40 MG tablet Take 1 tablet (40 mg total) by mouth daily. 90 tablet 1   No current facility-administered medications on file prior to visit.     Allergies  Allergen Reactions  . Penicillins Anaphylaxis    Has patient had a PCN reaction causing immediate rash, facial/tongue/throat swelling, SOB or lightheadedness with hypotension: Yes Has patient had a PCN reaction causing severe rash involving mucus membranes or skin necrosis: Unk Has patient had a PCN reaction that required hospitalization: Unk Has patient had a PCN reaction occurring within the last 10 years: No If all of the above answers are "NO", then may proceed with Cephalosporin use..  . Pseudoephedrine Hcl   . Sudafed [Pseudoephedrine] Other (See Comments)    Heart "races"  . Trazodone And Nefazodone Swelling    Family History  Problem Relation Age of Onset  . Alzheimer's disease Father   . Cancer Mother        Lymph node  . Heart failure Brother 45       Transplant 13 years ago  . CAD Brother   . Heart disease Sister 50       MI    Social History   Socioeconomic History  . Marital status: Legally Separated    Spouse name: Not on file  . Number of children: 2  . Years of education: Not on file  . Highest education level: Not on file  Occupational History  . Not on file  Social Needs  . Financial resource strain: Not on file  . Food insecurity:    Worry: Not on file    Inability: Not on file  . Transportation needs:    Medical: Not on file    Non-medical: Not on file  Tobacco Use  . Smoking status: Current Every Day Smoker    Packs/day: 0.50    Years: 50.00    Pack years: 25.00    Types: Cigarettes  .  Smokeless tobacco: Never Used  Substance and Sexual Activity  . Alcohol use: Yes    Alcohol/week: 0.0 oz    Comment: occasionally  . Drug use: No    Frequency: 5.0 times per week  . Sexual activity: Yes  Lifestyle  . Physical activity:    Days per week: Not on file    Minutes per session: Not on file  . Stress: Not on file  Relationships  . Social connections:    Talks on phone: Not on file    Gets together: Not on file    Attends religious service: Not on file    Active member of club or organization: Not on file    Attends meetings of clubs or organizations: Not on file    Relationship status: Not on file  . Intimate partner violence:    Fear of current or ex partner: Not on file    Emotionally abused: Not on file    Physically abused: Not on file    Forced sexual activity: Not on file  Other Topics Concern  . Not on file  Social History Narrative   One living child .  One murdered.  Lives with girlfriend.      ROS General: Denies fever, chills, night sweats, changes in weight, changes in appetite  +insomnia HEENT: Denies headaches, ear pain, changes in vision, rhinorrhea, sore throat CV: Denies CP, palpitations, SOB, orthopnea Pulm: Denies SOB, cough, wheezing GI: Denies abdominal pain, nausea, vomiting, diarrhea, constipation GU: Denies dysuria, hematuria, frequency, vaginal discharge Msk: Denies muscle cramps + joint pains Neuro: Denies weakness, numbness, tingling Skin: Denies rashes, bruising Psych: Denies depression, anxiety, hallucinations  BP 138/78 (BP Location: Right Arm, Patient Position: Sitting, Cuff Size: Normal)   Pulse 74   Temp 98.3 F (36.8 C) (Oral)   Ht 5\' 8"  (1.727 m)   Wt 203 lb (92.1 kg)   SpO2 98%   BMI 30.87 kg/m   Physical Exam Gen. Pleasant, well developed, well-nourished, in NAD HEENT - [place]/AT, PERRL, no scleral icterus, no nasal drainage, pharynx without erythema or exudate. TMs normal b/l.  No cervical lymphadenopathy. Lungs: no  use of accessory muscles, CTAB, no wheezes, rales or rhonchi Cardiovascular: RRR, No r/g/m, no peripheral edema Abdomen: BS present, soft, nontender, nondistended Musculoskeletal: No deformities, moves all four extremities, no cyanosis or clubbing, normal tone Neuro:  A&Ox3, CN II-XII intact, ambulates with a cane. Skin:  Warm, dry, intact, no lesions Psych: normal affect,  Mood appropriate  No results found for this or any previous visit (from the past 2160 hour(s)).  Assessment/Plan: Insomnia, unspecified type -sleep hygiene -pt advised to consider seeing psychiatry, given handout  -concerns about overly sedating med leading to increased fall risk. -pt advised to contact [facility] in regards to scheduling sleep study as they were unable to reach him.  Cigarette nicotine dependence without complication -smoking cessation counseling >3 min, <10 min -discussed cutting down -pt thinking about quitting, but not ready yet -will readdress at each OFV  Essential hypertension -continue current meds: lisinopril 40 mg, norvasc 10 mg, and hctz 12.5 mg daily -advised to limit sodium intake and increase physical activity.  Chronic left shoulder pain -continue f/u with Ortho -pt advised to contact preferred pain management in regards to if he is receiving injections from Ortho.  Encounter to establish care -We reviewed the PMH, PSH, FH, SH, Meds and Allergies. -We provided refills for any medications we will prescribe as needed. -We addressed current concerns per orders and patient instructions. -We have asked for records for pertinent exams, studies, vaccines and notes from previous providers. -We have advised patient to follow up per instructions below.  Gastroesophageal reflux disease, esophagitis presence not specified  - Plan: omeprazole (PRILOSEC) 20 MG capsule -if pt has continued reflux symptoms consider referral to GI.   F/u 1-2 mo, sooner if needed  [name], MD

## 2018-01-05 NOTE — Patient Instructions (Addendum)
You should call [facility] at [phone] to schedule your sleep study.  You also need to contact [facility] at [phone] in regards to whether or not you are still receiving injections from Orth[facility], [name].  If you are [facility] will need to take them over in order to see you.Insomnia Insomnia is a sleep disorder that makes it difficult to fall asleep or to stay asleep. Insomnia can cause tiredness (fatigue), low energy, difficulty concentrating, mood swings, and poor performance at work or school. There are three different ways to classify insomnia:  Difficulty falling asleep.  Difficulty staying asleep.  Waking up too early in the morning.  Any type of insomnia can be long-term (chronic) or short-term (acute). Both are common. Short-term insomnia usually lasts for three months or less. Chronic insomnia occurs at least three times a week for longer than three months. What are the causes? Insomnia may be caused by another condition, situation, or substance, such as:  Anxiety.  Certain medicines.  Gastroesophageal reflux disease (GERD) or other gastrointestinal conditions.  Asthma or other breathing conditions.  Restless legs syndrome, sleep apnea, or other sleep disorders.  Chronic pain.  Menopause. This may include hot flashes.  Stroke.  Abuse of alcohol, tobacco, or illegal drugs.  Depression.  Caffeine.  Neurological disorders, such as Alzheimer disease.  An overactive thyroid (hyperthyroidism).  The cause of insomnia may not be known. What increases the risk? Risk factors for insomnia include:  Gender. Women are more commonly affected than men.  Age. Insomnia is more common as you get older.  Stress. This may involve your professional or personal life.  Income. Insomnia is more common in people with lower income.  Lack of exercise.  Irregular work schedule or night shifts.  Traveling between different time  zones.  What are the signs or symptoms? If you have insomnia, trouble falling asleep or trouble staying asleep is the main symptom. This may lead to other symptoms, such as:  Feeling fatigued.  Feeling nervous about going to sleep.  Not feeling rested in the morning.  Having trouble concentrating.  Feeling irritable, anxious, or depressed.  How is this treated? Treatment for insomnia depends on the cause. If your insomnia is caused by an underlying condition, treatment will focus on addressing the condition. Treatment may also include:  Medicines to help you sleep.  Counseling or therapy.  Lifestyle adjustments.  Follow these instructions at home:  Take medicines only as directed by your health care provider.  Keep regular sleeping and waking hours. Avoid naps.  Keep a sleep diary to help you and your health care provider figure out what could be causing your insomnia. Include: ? When you sleep. ? When you wake up during the night. ? How well you sleep. ? How rested you feel the next day. ? Any side effects of medicines you are taking. ? What you eat and drink.  Make your bedroom a comfortable place where it is easy to fall asleep: ? Put up shades or special blackout curtains to block light from outside. ? Use a white noise machine to block noise. ? Keep the temperature cool.  Exercise regularly as directed by your health care provider. Avoid exercising right before bedtime.  Use relaxation techniques to manage stress. Ask your health care provider to suggest some techniques that may work well for you. These may include: ? Breathing exercises. ? Routines to release muscle tension. ? Visualizing peaceful scenes.  Cut back on alcohol, caffeinated  beverages, and cigarettes, especially close to bedtime. These can disrupt your sleep.  Do not overeat or eat spicy foods right before bedtime. This can lead to digestive discomfort that can make it hard for you to  sleep.  Limit screen use before bedtime. This includes: ? Watching TV. ? Using your smartphone, tablet, and computer.  Stick to a routine. This can help you fall asleep faster. Try to do a quiet activity, brush your teeth, and go to bed at the same time each night.  Get out of bed if you are still awake after 15 minutes of trying to sleep. Keep the lights down, but try reading or doing a quiet activity. When you feel sleepy, go back to bed.  Make sure that you drive carefully. Avoid driving if you feel very sleepy.  Keep all follow-up appointments as directed by your health care provider. This is important. Contact a health care provider if:  You are tired throughout the day or have trouble in your daily routine due to sleepiness.  You continue to have sleep problems or your sleep problems get worse. Get help right away if:  You have serious thoughts about hurting yourself or someone else. This information is not intended to replace advice given to you by your health care provider. Make sure you discuss any questions you have with your health care provider. Document Released: 07/02/2000 Document Revised: 12/05/2015 Document Reviewed: 04/05/2014 Elsevier Interactive Patient Education  2018 [facility] pain, which is also called arthralgia, can be caused by many things. Joint pain often goes away when you follow your health care provider's instructions for relieving pain at home. However, joint pain can also be caused by conditions that require further treatment. Common causes of joint pain include:  Bruising in the area of the joint.  Overuse of the joint.  Wear and tear on the joints that occur with aging (osteoarthritis).  Various other forms of arthritis.  A buildup of a crystal form of uric acid in the joint (gout).  Infections of the joint (septic arthritis) or of the bone (osteomyelitis).  Your health care provider may recommend medicine to help with  the pain. If your joint pain continues, additional tests may be needed to diagnose your condition. Follow these instructions at home: Watch your condition for any changes. Follow these instructions as directed to lessen the pain that you are feeling.  Take medicines only as directed by your health care provider.  Rest the affected area for as long as your health care provider says that you should. If directed to do so, raise the painful joint above the level of your heart while you are sitting or lying down.  Do not do things that cause or worsen pain.  If directed, apply ice to the painful area: ? Put ice in a plastic bag. ? Place a towel between your skin and the bag. ? Leave the ice on for 20 minutes, 2-3 times per day.  Wear an elastic bandage, splint, or sling as directed by your health care provider. Loosen the elastic bandage or splint if your fingers or toes become numb and tingle, or if they turn cold and blue.  Begin exercising or stretching the affected area as directed by your health care provider. Ask your health care provider what types of exercise are safe for you.  Keep all follow-up visits as directed by your health care provider. This is important.  Contact a health care provider if:  Your  pain increases, and medicine does not help.  Your joint pain does not improve within 3 days.  You have increased bruising or swelling.  You have a fever.  You lose 10 lb (4.5 kg) or more without trying. Get help right away if:  You are not able to move the joint.  Your fingers or toes become numb or they turn cold and blue. This information is not intended to replace advice given to you by your health care provider. Make sure you discuss any questions you have with your health care provider. Document Released: 07/05/2005 Document Revised: 12/05/2015 Document Reviewed: 04/16/2014 Elsevier Interactive Patient Education  2018 [employer].  Managing Your  Hypertension Hypertension is commonly called high blood pressure. This is when the force of your blood pressing against the walls of your arteries is too strong. Arteries are blood vessels that carry blood from your heart throughout your body. Hypertension forces the heart to work harder to pump blood, and may cause the arteries to become narrow or stiff. Having untreated or uncontrolled hypertension can cause heart attack, stroke, kidney disease, and other problems. What are blood pressure readings? A blood pressure reading consists of a higher number over a lower number. Ideally, your blood pressure should be below 120/80. The first ("top") number is called the systolic pressure. It is a measure of the pressure in your arteries as your heart beats. The second ("bottom") number is called the diastolic pressure. It is a measure of the pressure in your arteries as the heart relaxes. What does my blood pressure reading mean? Blood pressure is classified into four stages. Based on your blood pressure reading, your health care provider may use the following stages to determine what type of treatment you need, if any. Systolic pressure and diastolic pressure are measured in a unit called mm Hg. Normal  Systolic pressure: below 347.  Diastolic pressure: below 80. Elevated  Systolic pressure: 425-956.  Diastolic pressure: below 80. Hypertension stage 1  Systolic pressure: 387-564.  Diastolic pressure: 33-29. Hypertension stage 2  Systolic pressure: 518 or above.  Diastolic pressure: 90 or above. What health risks are associated with hypertension? Managing your hypertension is an important responsibility. Uncontrolled hypertension can lead to:  A heart attack.  A stroke.  A weakened blood vessel (aneurysm).  Heart failure.  Kidney damage.  Eye damage.  Metabolic syndrome.  Memory and concentration problems.  What changes can I make to manage my hypertension? Hypertension can be  managed by making lifestyle changes and possibly by taking medicines. Your health care provider will help you make a plan to bring your blood pressure within a normal range. Eating and drinking  Eat a diet that is high in fiber and potassium, and low in salt (sodium), added sugar, and fat. An example eating plan is called the DASH (Dietary Approaches to Stop Hypertension) diet. To eat this way: ? Eat plenty of fresh fruits and vegetables. Try to fill half of your plate at each meal with fruits and vegetables. ? Eat whole grains, such as whole wheat pasta, brown rice, or whole grain bread. Fill about one quarter of your plate with whole grains. ? Eat low-fat diary products. ? Avoid fatty cuts of meat, processed or cured meats, and poultry with skin. Fill about one quarter of your plate with lean proteins such as fish, chicken without skin, beans, eggs, and tofu. ? Avoid premade and processed foods. These tend to be higher in sodium, added sugar, and fat.  Reduce  your daily sodium intake. Most people with hypertension should eat less than 1,500 mg of sodium a day.  Limit alcohol intake to no more than 1 drink a day for nonpregnant women and 2 drinks a day for men. One drink equals 12 oz of beer, 5 oz of wine, or 1 oz of hard liquor. Lifestyle  Work with your health care provider to maintain a healthy body weight, or to lose weight. Ask what an ideal weight is for you.  Get at least 30 minutes of exercise that causes your heart to beat faster (aerobic exercise) most days of the week. Activities may include walking, swimming, or biking.  Include exercise to strengthen your muscles (resistance exercise), such as weight lifting, as part of your weekly exercise routine. Try to do these types of exercises for 30 minutes at least 3 days a week.  Do not use any products that contain nicotine or tobacco, such as cigarettes and e-cigarettes. If you need help quitting, ask your health care  provider.  Control any long-term (chronic) conditions you have, such as high cholesterol or diabetes. Monitoring  Monitor your blood pressure at home as told by your health care provider. Your personal target blood pressure may vary depending on your medical conditions, your age, and other factors.  Have your blood pressure checked regularly, as often as told by your health care provider. Working with your health care provider  Review all the medicines you take with your health care provider because there may be side effects or interactions.  Talk with your health care provider about your diet, exercise habits, and other lifestyle factors that may be contributing to hypertension.  Visit your health care provider regularly. Your health care provider can help you create and adjust your plan for managing hypertension. Will I need medicine to control my blood pressure? Your health care provider may prescribe medicine if lifestyle changes are not enough to get your blood pressure under control, and if:  Your systolic blood pressure is 130 or higher.  Your diastolic blood pressure is 80 or higher.  Take medicines only as told by your health care provider. Follow the directions carefully. Blood pressure medicines must be taken as prescribed. The medicine does not work as well when you skip doses. Skipping doses also puts you at risk for problems. Contact a health care provider if:  You think you are having a reaction to medicines you have taken.  You have repeated (recurrent) headaches.  You feel dizzy.  You have swelling in your ankles.  You have trouble with your vision. Get help right away if:  You develop a severe headache or confusion.  You have unusual weakness or numbness, or you feel faint.  You have severe pain in your chest or abdomen.  You vomit repeatedly.  You have trouble breathing. Summary  Hypertension is when the force of blood pumping through your arteries is  too strong. If this condition is not controlled, it may put you at risk for serious complications.  Your personal target blood pressure may vary depending on your medical conditions, your age, and other factors. For most people, a normal blood pressure is less than 120/80.  Hypertension is managed by lifestyle changes, medicines, or both. Lifestyle changes include weight loss, eating a healthy, low-sodium diet, exercising more, and limiting alcohol. This information is not intended to replace advice given to you by your health care provider. Make sure you discuss any questions you have with your health care provider.  Document Released: 03/29/2012 Document Revised: 06/02/2016 Document Reviewed: 06/02/2016 Elsevier Interactive Patient Education  [employer].

## 2018-01-10 ENCOUNTER — Emergency Department (HOSPITAL_COMMUNITY): Payer: Medicare Other

## 2018-01-10 ENCOUNTER — Inpatient Hospital Stay (HOSPITAL_COMMUNITY)
Admission: EM | Admit: 2018-01-10 | Discharge: 2018-01-13 | DRG: 812 | Disposition: A | Discharge status: Home / Self Care | Payer: Medicare Other | Attending: Internal Medicine | Admitting: Internal Medicine

## 2018-01-10 ENCOUNTER — Other Ambulatory Visit: Payer: Self-pay

## 2018-01-10 DIAGNOSIS — R4020 Unspecified coma: Secondary | ICD-10-CM | POA: Diagnosis not present

## 2018-01-10 DIAGNOSIS — K449 Diaphragmatic hernia without obstruction or gangrene: Secondary | ICD-10-CM | POA: Diagnosis present

## 2018-01-10 DIAGNOSIS — K5900 Constipation, unspecified: Secondary | ICD-10-CM | POA: Diagnosis present

## 2018-01-10 DIAGNOSIS — K319 Disease of stomach and duodenum, unspecified: Secondary | ICD-10-CM | POA: Diagnosis present

## 2018-01-10 DIAGNOSIS — H409 Unspecified glaucoma: Secondary | ICD-10-CM | POA: Diagnosis present

## 2018-01-10 DIAGNOSIS — F17210 Nicotine dependence, cigarettes, uncomplicated: Secondary | ICD-10-CM | POA: Diagnosis present

## 2018-01-10 DIAGNOSIS — F329 Major depressive disorder, single episode, unspecified: Secondary | ICD-10-CM | POA: Diagnosis present

## 2018-01-10 DIAGNOSIS — D649 Anemia, unspecified: Secondary | ICD-10-CM | POA: Diagnosis not present

## 2018-01-10 DIAGNOSIS — K2100 Gastro-esophageal reflux disease with esophagitis, without bleeding: Secondary | ICD-10-CM

## 2018-01-10 DIAGNOSIS — I69354 Hemiplegia and hemiparesis following cerebral infarction affecting left non-dominant side: Secondary | ICD-10-CM | POA: Diagnosis not present

## 2018-01-10 DIAGNOSIS — I10 Essential (primary) hypertension: Secondary | ICD-10-CM | POA: Diagnosis present

## 2018-01-10 DIAGNOSIS — I959 Hypotension, unspecified: Secondary | ICD-10-CM | POA: Diagnosis present

## 2018-01-10 DIAGNOSIS — E785 Hyperlipidemia, unspecified: Secondary | ICD-10-CM | POA: Diagnosis present

## 2018-01-10 DIAGNOSIS — K2970 Gastritis, unspecified, without bleeding: Secondary | ICD-10-CM | POA: Diagnosis not present

## 2018-01-10 DIAGNOSIS — D509 Iron deficiency anemia, unspecified: Secondary | ICD-10-CM | POA: Diagnosis not present

## 2018-01-10 DIAGNOSIS — Z8546 Personal history of malignant neoplasm of prostate: Secondary | ICD-10-CM | POA: Diagnosis not present

## 2018-01-10 DIAGNOSIS — K257 Chronic gastric ulcer without hemorrhage or perforation: Secondary | ICD-10-CM | POA: Diagnosis present

## 2018-01-10 DIAGNOSIS — I440 Atrioventricular block, first degree: Secondary | ICD-10-CM | POA: Diagnosis not present

## 2018-01-10 DIAGNOSIS — R55 Syncope and collapse: Secondary | ICD-10-CM | POA: Diagnosis present

## 2018-01-10 DIAGNOSIS — K259 Gastric ulcer, unspecified as acute or chronic, without hemorrhage or perforation: Secondary | ICD-10-CM | POA: Diagnosis not present

## 2018-01-10 DIAGNOSIS — R12 Heartburn: Secondary | ICD-10-CM | POA: Diagnosis not present

## 2018-01-10 DIAGNOSIS — R531 Weakness: Secondary | ICD-10-CM | POA: Diagnosis not present

## 2018-01-10 DIAGNOSIS — I6789 Other cerebrovascular disease: Secondary | ICD-10-CM | POA: Diagnosis not present

## 2018-01-10 HISTORY — DX: Gastro-esophageal reflux disease with esophagitis: K21.0

## 2018-01-10 HISTORY — DX: Chronic gastric ulcer without hemorrhage or perforation: K25.7

## 2018-01-10 HISTORY — DX: Diaphragmatic hernia without obstruction or gangrene: K44.9

## 2018-01-10 LAB — PREPARE RBC (CROSSMATCH)

## 2018-01-10 LAB — BASIC METABOLIC PANEL
Anion gap: 8 (ref 5–15)
BUN: 20 mg/dL (ref 8–23)
CO2: 24 mmol/L (ref 22–32)
Calcium: 9.1 mg/dL (ref 8.9–10.3)
Chloride: 110 mmol/L (ref 98–111)
Creatinine, Ser: 1.14 mg/dL (ref 0.61–1.24)
GFR calc Af Amer: >60 mL/min (ref >60)
GFR calc non Af Amer: >60 mL/min (ref >60)
Glucose, Bld: 105 mg/dL — ABNORMAL HIGH (ref 70–99)
Potassium: 3.6 mmol/L (ref 3.5–5.1)
Sodium: 142 mmol/L (ref 135–145)

## 2018-01-10 LAB — CBC WITH DIFFERENTIAL/PLATELET
Basophils Absolute: 0 10*3/uL (ref 0.0–0.1)
Basophils Relative: 0 %
Eosinophils Absolute: 0 10*3/uL (ref 0.0–0.7)
Eosinophils Relative: 0 %
HCT: 22.6 % — ABNORMAL LOW (ref 39.0–52.0)
Hemoglobin: 5.7 g/dL — CL (ref 13.0–17.0)
Lymphocytes Relative: 13 %
Lymphs Abs: 0.9 10*3/uL (ref 0.7–4.0)
MCH: 16.1 pg — ABNORMAL LOW (ref 26.0–34.0)
MCHC: 25.2 g/dL — ABNORMAL LOW (ref 30.0–36.0)
MCV: 63.8 fL — ABNORMAL LOW (ref 78.0–100.0)
Monocytes Absolute: 0.3 10*3/uL (ref 0.1–1.0)
Monocytes Relative: 4 %
Neutro Abs: 5.7 10*3/uL (ref 1.7–7.7)
Neutrophils Relative %: 83 %
Platelets: 328 10*3/uL (ref 150–400)
RBC: 3.54 MIL/uL — ABNORMAL LOW (ref 4.22–5.81)
RDW: 19.8 % — ABNORMAL HIGH (ref 11.5–15.5)
WBC: 6.9 10*3/uL (ref 4.0–10.5)

## 2018-01-10 LAB — CBC
HCT: 22.3 % — ABNORMAL LOW (ref 39.0–52.0)
Hemoglobin: 5.5 g/dL — CL (ref 13.0–17.0)
MCH: 15.7 pg — ABNORMAL LOW (ref 26.0–34.0)
MCHC: 24.7 g/dL — ABNORMAL LOW (ref 30.0–36.0)
MCV: 63.7 fL — ABNORMAL LOW (ref 78.0–100.0)
Platelets: 316 10*3/uL (ref 150–400)
RBC: 3.5 MIL/uL — ABNORMAL LOW (ref 4.22–5.81)
RDW: 20 % — ABNORMAL HIGH (ref 11.5–15.5)
WBC: 5.7 10*3/uL (ref 4.0–10.5)

## 2018-01-10 LAB — POC OCCULT BLOOD, ED: Fecal Occult Bld: NEGATIVE

## 2018-01-10 LAB — IRON AND TIBC
Iron: 7 ug/dL — ABNORMAL LOW (ref 45–182)
Saturation Ratios: 1 % — ABNORMAL LOW (ref 17.9–39.5)
TIBC: 517 ug/dL — ABNORMAL HIGH (ref 250–450)
UIBC: 510 ug/dL

## 2018-01-10 LAB — FERRITIN: Ferritin: 1 ng/mL — ABNORMAL LOW (ref 24–336)

## 2018-01-10 LAB — HEMOGLOBIN AND HEMATOCRIT, BLOOD
HCT: 27.7 % — ABNORMAL LOW (ref 39.0–52.0)
Hemoglobin: 7.7 g/dL — ABNORMAL LOW (ref 13.0–17.0)

## 2018-01-10 MED ORDER — ZOLPIDEM TARTRATE 5 MG PO TABS
5.0000 mg | ORAL_TABLET | Freq: Once | ORAL | Status: AC
  Administered 2018-01-10: 5 mg via ORAL
  Filled 2018-01-10: qty 1

## 2018-01-10 MED ORDER — ACETAMINOPHEN 325 MG PO TABS
650.0000 mg | ORAL_TABLET | Freq: Four times a day (QID) | ORAL | Status: DC | PRN
  Administered 2018-01-11 – 2018-01-12 (×4): 650 mg via ORAL
  Filled 2018-01-10 (×5): qty 2

## 2018-01-10 MED ORDER — HYDRALAZINE HCL 20 MG/ML IJ SOLN
10.0000 mg | INTRAMUSCULAR | Status: DC | PRN
  Administered 2018-01-10 – 2018-01-12 (×4): 10 mg via INTRAVENOUS
  Filled 2018-01-10 (×4): qty 1

## 2018-01-10 MED ORDER — SODIUM CHLORIDE 0.9 % IV SOLN: INTRAVENOUS | Status: DC

## 2018-01-10 MED ORDER — PANTOPRAZOLE SODIUM 40 MG PO TBEC: 40.0000 mg | DELAYED_RELEASE_TABLET | Freq: Two times a day (BID) | ORAL | Status: DC

## 2018-01-10 MED ORDER — SODIUM CHLORIDE 0.9 % IV SOLN
10.0000 mL/h | Freq: Once | INTRAVENOUS | Status: AC
  Administered 2018-01-10: 10 mL/h via INTRAVENOUS

## 2018-01-10 MED ORDER — DULOXETINE HCL 30 MG PO CPEP
30.0000 mg | ORAL_CAPSULE | Freq: Two times a day (BID) | ORAL | Status: DC
  Administered 2018-01-10 – 2018-01-13 (×7): 30 mg via ORAL
  Filled 2018-01-10 (×7): qty 1

## 2018-01-10 MED ORDER — ONDANSETRON HCL 4 MG/2ML IJ SOLN: 4.0000 mg | Freq: Four times a day (QID) | INTRAMUSCULAR | Status: DC | PRN

## 2018-01-10 MED ORDER — PANTOPRAZOLE SODIUM 40 MG IV SOLR
40.0000 mg | Freq: Two times a day (BID) | INTRAVENOUS | Status: DC
Start: 2018-01-10 — End: 2018-01-13
  Administered 2018-01-10 – 2018-01-12 (×6): 40 mg via INTRAVENOUS
  Filled 2018-01-10 (×7): qty 40

## 2018-01-10 MED ORDER — ONDANSETRON HCL 4 MG PO TABS: 4.0000 mg | ORAL_TABLET | Freq: Four times a day (QID) | ORAL | Status: DC | PRN

## 2018-01-10 MED ORDER — ACETAMINOPHEN 650 MG RE SUPP: 650.0000 mg | Freq: Four times a day (QID) | RECTAL | Status: DC | PRN

## 2018-01-10 MED ORDER — SODIUM CHLORIDE 0.9 % IV SOLN
510.0000 mg | Freq: Once | INTRAVENOUS | Status: AC
  Administered 2018-01-10: 510 mg via INTRAVENOUS
  Filled 2018-01-10: qty 17

## 2018-01-10 MED ORDER — AMLODIPINE BESYLATE 10 MG PO TABS
10.0000 mg | ORAL_TABLET | Freq: Every day | ORAL | Status: DC
  Administered 2018-01-10 – 2018-01-13 (×4): 10 mg via ORAL
  Filled 2018-01-10 (×4): qty 1

## 2018-01-10 NOTE — ED Notes (Signed)
Bed: WA05 Expected date:  Expected time:  Means of arrival:  Comments: 

## 2018-01-10 NOTE — H&P (Signed)
History and Physical    [name] NID:[id number] DOB: [birth year] DOA: 01/10/2018  PCP: Billie Ruddy, MD  Patient coming from: Home  Chief Complaint: Syncopal episode   HPI: [name] is a 67 y.o. male with medical history significant of iron deficiency anemia dating back to 2010, chronic anemia with baseline Hgb 9-11, previous stroke with residual left-sided weakness, HTN, depression who presents after syncopal episode at home. Celesta Gentile witnessed the event and did not note any seizure-like activity at the time. On my examination, patient is quite lethargic and although is alert to voice, very quickly falls asleep and does not stay awake long enough to answer any questions. Celesta Gentile states that patient had some dizziness with standing up, but otherwise as far as she knows he had no complaints of nausea, vomiting, diarrhea, dark stools, bloody stools, or abdominal pain.   ED Course: Initial blood pressure 88/60 improved to 157/84 in the ED, labs revealed Hgb 5.5, FOBT negative, CT head without acute intracranial abnormality. Ordered 2u pRBC transfusion.   Review of Systems: Limited due to lethargy and inability to engage in conversation   Past Medical History:  Diagnosis Date  . Arthritis   . Chronic back pain   . GERD (gastroesophageal reflux disease)    uses Baking Soda  . GIB (gastrointestinal bleeding) 2012  . Glaucoma    right eye  . Headache    occasionally  . Hepatitis C   . History of blood transfusion    no abnormal reaction noted  . History of gout    doesn't take any meds  . Hyperlipidemia    not on any meds  . Hypertension    takes Amlodipine and Lisinopril daily  . Insomnia    takes Ambien nightly  . Ischemic colitis ([place]) 2012  . Joint pain   . Nocturia   . Numbness    both legs occasionally  . PONV (postoperative nausea and vomiting)   . Prostate cancer ([place])   . Shortness of breath dyspnea    rarely but when notices he can be  lying/sitting/exertion.Dr.Hochrein is aware per pt  . Stroke ([place]) 2016   . Urinary frequency   . Urinary urgency     Past Surgical History:  Procedure Laterality Date  . APPENDECTOMY    . COLONOSCOPY    . COLONOSCOPY N/A 10/26/2015   Procedure: COLONOSCOPY;  Surgeon: Doran Stabler, MD;  Location: [facility] ENDOSCOPY;  Service: Endoscopy;  Laterality: N/A;  . ESOPHAGOGASTRODUODENOSCOPY    . ESOPHAGOGASTRODUODENOSCOPY N/A 10/26/2015   Procedure: ESOPHAGOGASTRODUODENOSCOPY (EGD);  Surgeon: Doran Stabler, MD;  Location: [facility] ENDOSCOPY;  Service: Endoscopy;  Laterality: N/A;  . INGUINAL HERNIA REPAIR Right 11/04/2014   Procedure: RIGHT INGUINAL HERNIA REPAIR WITH MESH;  Surgeon: Jackolyn Confer, MD;  Location: [place];  Service: General;  Laterality: Right;  . MASS EXCISION N/A 11/04/2014   Procedure: REMOVAL OF RIGHT GROIN SOFT TISSUE MASS;  Surgeon: Jackolyn Confer, MD;  Location: [place];  Service: General;  Laterality: N/A;  . Multiple abdominal surgeries     total of 13  . PROSTATECTOMY    . SMALL INTESTINE SURGERY       reports that he has been smoking cigarettes.  He has a 25.00 pack-year smoking history. He has never used smokeless tobacco. He reports that he drinks alcohol. He reports that he does not use drugs.  Allergies  Allergen Reactions  . Penicillins Anaphylaxis    Has patient had a PCN  reaction causing immediate rash, facial/tongue/throat swelling, SOB or lightheadedness with hypotension: Yes Has patient had a PCN reaction causing severe rash involving mucus membranes or skin necrosis: Unk Has patient had a PCN reaction that required hospitalization: Unk Has patient had a PCN reaction occurring within the last 10 years: No If all of the above answers are "NO", then may proceed with Cephalosporin use..  . Pseudoephedrine Hcl   . Sudafed [Pseudoephedrine] Other (See Comments)    Heart "races"  . Trazodone And Nefazodone Swelling    Family History  Problem Relation Age of  Onset  . Alzheimer's disease Father   . Cancer Mother        Lymph node  . Heart failure Brother 45       Transplant 13 years ago  . CAD Brother   . Heart disease Sister 6       MI    Prior to Admission medications   Medication Sig Start Date End Date Taking? Authorizing Provider  acetaminophen (TYLENOL) 325 MG tablet Take 2 tablets (650 mg total) by mouth every 6 (six) hours as needed for mild pain or moderate pain. 07/26/17   Waynetta Pean, PA-C  amLODipine (NORVASC) 10 MG tablet Take 1 tablet (10 mg total) by mouth daily. 10/18/17   [name], MD  cloNIDine (CATAPRES) 0.1 MG tablet Take 0.1 mg by mouth daily.    [provider]  Diclofenac Sodium 3 % GEL Place 1 application onto the skin 2 (two) times daily. To affected area. 07/26/17   Waynetta Pean, PA-C  DULoxetine (CYMBALTA) 30 MG capsule Take 1 capsule (30 mg total) by mouth 2 (two) times daily. 10/18/17   [name], MD  hydrochlorothiazide (MICROZIDE) 12.5 MG capsule Take 1 capsule (12.5 mg total) by mouth daily. 10/18/17   [name], MD  HYDROcodone-acetaminophen (NORCO/VICODIN) 5-325 MG tablet Take 1 tablet by mouth every 6 (six) hours as needed for moderate pain. 12/01/17   [name], MD  hydrOXYzine (ATARAX/VISTARIL) 25 MG tablet Take 1-2 tablets (25-50 mg total) by mouth at bedtime as needed (sleep). 08/29/17   [name], MD  lisinopril (PRINIVIL,ZESTRIL) 40 MG tablet Take 1 tablet (40 mg total) by mouth daily. 10/18/17   [name], MD  omeprazole (PRILOSEC) 20 MG capsule Take 1 capsule (20 mg total) by mouth daily. 01/05/18   Billie Ruddy, MD    Physical Exam: Vitals:   01/10/18 0500 01/10/18 0602 01/10/18 0603 01/10/18 0625  BP: 137/74  (!) 164/77 (!) 142/69  Pulse: 63  66 60  Resp: (!) 21  16 18   Temp:   98.2 F (36.8 C) 98.2 F (36.8 C)  TempSrc:   Oral Oral  SpO2: 100%  96% 100%  Weight:  90.6 kg (199 lb 11.8 oz)    Height:  5\' 9"  (1.753 m)       Constitutional: NAD, calm, comfortable, lethargic  Eyes: PERRL, lids and conjunctivae normal Neck: normal, supple, no masses, no thyromegaly Respiratory: clear to auscultation bilaterally, no wheezing, no crackles. Normal respiratory effort. No accessory muscle use.  Cardiovascular: Regular rate and rhythm, no murmurs / rubs / gallops. No extremity edema.  Abdomen: no tenderness, no masses palpated. No hepatosplenomegaly. Bowel sounds positive. Soft  Musculoskeletal: no clubbing / cyanosis. No joint deformity upper and lower extremities. Normal muscle tone.  Skin: no rashes, lesions, ulcers, induration on exposed skin  Neurologic: alert to voice   Labs on Admission: I have personally reviewed  following labs and imaging studies  CBC: Recent Labs  Lab 01/10/18 0151 01/10/18 0433  WBC 5.7 6.9  NEUTROABS  --  5.7  HGB 5.5* 5.7*  HCT 22.3* 22.6*  MCV 63.7* 63.8*  PLT 316 735   Basic Metabolic Panel: Recent Labs  Lab 01/10/18 0151  NA 142  K 3.6  CL 110  CO2 24  GLUCOSE 105*  BUN 20  CREATININE 1.14  CALCIUM 9.1   GFR: Estimated Creatinine Clearance: 69 mL/min (by C-G formula based on SCr of 1.14 mg/dL). Liver Function Tests: No results for input(s): AST, ALT, ALKPHOS, BILITOT, PROT, ALBUMIN in the last 168 hours. No results for input(s): LIPASE, AMYLASE in the last 168 hours. No results for input(s): AMMONIA in the last 168 hours. Coagulation Profile: No results for input(s): INR, PROTIME in the last 168 hours. Cardiac Enzymes: No results for input(s): CKTOTAL, CKMB, CKMBINDEX, TROPONINI in the last 168 hours. BNP (last 3 results) No results for input(s): PROBNP in the last 8760 hours. HbA1C: No results for input(s): HGBA1C in the last 72 hours. CBG: No results for input(s): GLUCAP in the last 168 hours. Lipid Profile: No results for input(s): CHOL, HDL, LDLCALC, TRIG, CHOLHDL, LDLDIRECT in the last 72 hours. Thyroid Function Tests: No results for input(s):  TSH, T4TOTAL, FREET4, T3FREE, THYROIDAB in the last 72 hours. Anemia Panel: Recent Labs    01/10/18 0151  TIBC 517*  IRON 7*   Urine analysis:    Component Value Date/Time   COLORURINE YELLOW 09/10/2016 1730   APPEARANCEUR HAZY (A) 09/10/2016 1730   LABSPEC 1.023 09/10/2016 1730   PHURINE 5.0 09/10/2016 1730   GLUCOSEU NEGATIVE 09/10/2016 1730   HGBUR NEGATIVE 09/10/2016 1730   BILIRUBINUR NEGATIVE 09/10/2016 1730   BILIRUBINUR n 02/13/2016 1044   KETONESUR NEGATIVE 09/10/2016 1730   PROTEINUR 30 (A) 09/10/2016 1730   UROBILINOGEN 1.0 02/13/2016 1044   UROBILINOGEN 1.0 01/27/2012 0005   NITRITE NEGATIVE 09/10/2016 1730   LEUKOCYTESUR NEGATIVE 09/10/2016 1730   Sepsis Labs: !!!!!!!!!!!!!!!!!!!!!!!!!!!!!!!!!!!!!!!!!!!! @LABRCNTIP (procalcitonin:4,lacticidven:4) )No results found for this or any previous visit (from the past 240 hour(s)).   Radiological Exams on Admission: Ct Head Wo Contrast  Result Date: 01/10/2018 CLINICAL DATA:  Patient became unresponsive and vomited. Lethargy. Left-sided leg weakness. EXAM: CT HEAD WITHOUT CONTRAST TECHNIQUE: Contiguous axial images were obtained from the base of the skull through the vertex without intravenous contrast. COMPARISON:  10/26/2017 head CT FINDINGS: Brain: Patchy chronic appearing small vessel ischemic disease. Chronic pontine and left basal ganglial lacunar infarcts. No large vascular territory infarct, hemorrhage or midline shift. No intra-axial mass nor extra-axial fluid collections. Superficial atrophy is noted as before. Vascular: No hyperdense vessel sign. Skull: Intact bony calvarium.  No soft tissue swelling of the scalp. Sinuses/Orbits: Clear paranasal sinuses.  Intact orbits and globes. Other: None IMPRESSION: Atrophy with chronic small vessel ischemia. No acute intracranial abnormality. Electronically Signed   By: Ashley Royalty M.D.   On: 01/10/2018 03:39    EKG: Independently reviewed. Normal sinus rhythm with left axis  deviation with J-point elevation in septal leads, no significant change since 12/2016   Assessment/Plan Principal Problem:   Symptomatic anemia Active Problems:   HTN (hypertension)   Anemia, iron deficiency   Syncope  Syncope with symptomatic anemia, iron deficiency anemia  -Chronic iron deficiency anemia dating back to 2010 -Baseline Hgb 9-11 -EGD and colonoscopy in 2017 with Dr. Loletha Carrow without acute findings to explain patient's chronic anemia, considered small bowel series and capsule endoscopy at  that time if anemia persisted  -FOBT negative -Transfusing 2u pRBC -Ferritin 1, iron 7, TIBC 517. Ordered IV feraheme  -GI consulted -Will keep NPO   HTN -Will hold home norvasc, catapres, HCTZ, lisinopril due to hypotension on admission  Depression -Continue cymbalta    DVT prophylaxis: SCD Code Status: Full  Family Communication: No family at bedside, spoke with fiancee over the phone  Disposition Plan: Pending improvement in anemia Consults called: [facility]   Admission status: Inpatient   * I certify that at the point of admission it is my clinical judgment that the patient will require inpatient hospital care spanning beyond 2 midnights from the point of admission due to high intensity of service, high risk for further deterioration and high frequency of surveillance required.*   Dessa Phi, DO [facility] [email or web address] Password [facility] 01/10/2018, 8:45 AM

## 2018-01-10 NOTE — ED Provider Notes (Signed)
[place] DEPT Provider Note: Georgena Spurling, MD, FACEP  CSN: [id number] MRN: [id number] ARRIVAL: 01/10/18 at [place]: 1422/1422-01   CHIEF COMPLAINT  Syncope   HISTORY OF PRESENT ILLNESS  01/10/18 1:59 AM [name] is a 67 y.o. male with a history of a stroke 2 years ago that left him with left-sided weakness.  He was talking to his wife about 12:30 AM today and he went unresponsive for about 1 minute.  He then subsequently vomited on himself.  She did not report any tonic-clonic activity but EMS noted him to be very lethargic and slow to respond on their arrival.  He did admit to taking his wife's Seroquel to help him sleep but he has taken Seroquel in the past without difficulty.  He denies pain.  He has left-sided weakness which he states is at his baseline.  He does not remember passing out or vomiting on himself.   Past Medical History:  Diagnosis Date  . Arthritis   . Chronic back pain   . GERD (gastroesophageal reflux disease)    uses Baking Soda  . GIB (gastrointestinal bleeding) 2012  . Glaucoma    right eye  . Headache    occasionally  . Hepatitis C   . History of blood transfusion    no abnormal reaction noted  . History of gout    doesn't take any meds  . Hyperlipidemia    not on any meds  . Hypertension    takes Amlodipine and Lisinopril daily  . Insomnia    takes Ambien nightly  . Ischemic colitis ([place]) 2012  . Joint pain   . Nocturia   . Numbness    both legs occasionally  . PONV (postoperative nausea and vomiting)   . Prostate cancer ([place])   . Shortness of breath dyspnea    rarely but when notices he can be lying/sitting/exertion.Dr.Hochrein is aware per pt  . Stroke ([place]) 2016   . Urinary frequency   . Urinary urgency     Past Surgical History:  Procedure Laterality Date  . APPENDECTOMY    . COLONOSCOPY    . COLONOSCOPY N/A 10/26/2015   Procedure: COLONOSCOPY;  Surgeon: Doran Stabler, MD;  Location: [facility] ENDOSCOPY;  Service:  Endoscopy;  Laterality: N/A;  . ESOPHAGOGASTRODUODENOSCOPY    . ESOPHAGOGASTRODUODENOSCOPY N/A 10/26/2015   Procedure: ESOPHAGOGASTRODUODENOSCOPY (EGD);  Surgeon: Doran Stabler, MD;  Location: [facility] ENDOSCOPY;  Service: Endoscopy;  Laterality: N/A;  . INGUINAL HERNIA REPAIR Right 11/04/2014   Procedure: RIGHT INGUINAL HERNIA REPAIR WITH MESH;  Surgeon: Jackolyn Confer, MD;  Location: [place];  Service: General;  Laterality: Right;  . MASS EXCISION N/A 11/04/2014   Procedure: REMOVAL OF RIGHT GROIN SOFT TISSUE MASS;  Surgeon: Jackolyn Confer, MD;  Location: [place];  Service: General;  Laterality: N/A;  . Multiple abdominal surgeries     total of 13  . PROSTATECTOMY    . SMALL INTESTINE SURGERY      Family History  Problem Relation Age of Onset  . Alzheimer's disease Father   . Cancer Mother        Lymph node  . Heart failure Brother 45       Transplant 13 years ago  . CAD Brother   . Heart disease Sister 20       MI    Social History   Tobacco Use  . Smoking status: Current Every Day Smoker    Packs/day: 0.50    Years:  50.00    Pack years: 25.00    Types: Cigarettes  . Smokeless tobacco: Never Used  Substance Use Topics  . Alcohol use: Yes    Alcohol/week: 0.0 oz    Comment: occasionally  . Drug use: No    Frequency: 5.0 times per week    Prior to Admission medications   Medication Sig Start Date End Date Taking? Authorizing Provider  acetaminophen (TYLENOL) 325 MG tablet Take 2 tablets (650 mg total) by mouth every 6 (six) hours as needed for mild pain or moderate pain. 07/26/17   Waynetta Pean, PA-C  amLODipine (NORVASC) 10 MG tablet Take 1 tablet (10 mg total) by mouth daily. 10/18/17   [name], MD  cloNIDine (CATAPRES) 0.1 MG tablet Take 0.1 mg by mouth daily.    [provider]  Diclofenac Sodium 3 % GEL Place 1 application onto the skin 2 (two) times daily. To affected area. 07/26/17   Waynetta Pean, PA-C  DULoxetine (CYMBALTA) 30 MG capsule Take 1  capsule (30 mg total) by mouth 2 (two) times daily. 10/18/17   [name], MD  hydrochlorothiazide (MICROZIDE) 12.5 MG capsule Take 1 capsule (12.5 mg total) by mouth daily. 10/18/17   [name], MD  HYDROcodone-acetaminophen (NORCO/VICODIN) 5-325 MG tablet Take 1 tablet by mouth every 6 (six) hours as needed for moderate pain. 12/01/17   [name], MD  hydrOXYzine (ATARAX/VISTARIL) 25 MG tablet Take 1-2 tablets (25-50 mg total) by mouth at bedtime as needed (sleep). 08/29/17   [name], MD  lisinopril (PRINIVIL,ZESTRIL) 40 MG tablet Take 1 tablet (40 mg total) by mouth daily. 10/18/17   [name], MD  omeprazole (PRILOSEC) 20 MG capsule Take 1 capsule (20 mg total) by mouth daily. 01/05/18   Billie Ruddy, MD    Allergies Penicillins; Pseudoephedrine hcl; Sudafed [pseudoephedrine]; and Trazodone and nefazodone   REVIEW OF SYSTEMS  Negative except as noted here or in the History of Present Illness.   PHYSICAL EXAMINATION  Initial Vital Signs Blood pressure (!) 88/60, pulse 70, temperature 98.5 F (36.9 C), temperature source Oral, resp. rate (!) 27, height 5\' 9"  (1.753 m), weight 104.3 kg (230 lb), SpO2 99 %.  Examination General: Well-developed, well-nourished male in no acute distress; appearance consistent with age of record HENT: normocephalic; atraumatic; conjunctival pallor Eyes: pupils equal, round and reactive to light; extraocular muscles intact Neck: supple Heart: regular rate and rhythm Lungs: clear to auscultation bilaterally Abdomen: soft; nondistended; nontender; bowel sounds present Rectal: Normal sphincter tone; stool on examining glove brown and heme-negative Extremities: No deformity; full range of motion; pulses normal Neurologic: Awake, lethargic but oriented; dysarthria; left hemiparesis with generalized slowness of movement Skin: Warm and dry; pale Psychiatric: Flat affect   RESULTS  Summary of this visit's results, reviewed  by myself:   EKG Interpretation  Date/Time:  Tuesday January 10 2018 01:56:37 EDT Ventricular Rate:  69 PR Interval:    QRS Duration: 100 QT Interval:  451 QTC Calculation: 484 R Axis:   -9 Text Interpretation:  Sinus rhythm Prolonged PR interval Abnormal R-wave progression, early transition Left ventricular hypertrophy Borderline prolonged QT interval No significant change was found Confirmed by Shanon Rosser [id number]) on 01/10/2018 1:58:33 AM Also confirmed by Shanon Rosser [phone]), editor Hattie Perch ([id number])  on 01/10/2018 7:07:31 AM      Laboratory Studies: Results for orders placed or performed during the hospital encounter of 01/10/18 (from the past 24 hour(s))  Basic metabolic panel  Status: Abnormal   Collection Time: 01/10/18  1:51 AM  Result Value Ref Range   Sodium 142 135 - 145 mmol/L   Potassium 3.6 3.5 - 5.1 mmol/L   Chloride 110 98 - 111 mmol/L   CO2 24 22 - 32 mmol/L   Glucose, Bld 105 (H) 70 - 99 mg/dL   BUN 20 8 - 23 mg/dL   Creatinine, Ser 1.14 0.61 - 1.24 mg/dL   Calcium 9.1 8.9 - 10.3 mg/dL   GFR calc non Af Amer >60 >60 mL/min   GFR calc Af Amer >60 >60 mL/min   Anion gap 8 5 - 15  CBC     Status: Abnormal   Collection Time: 01/10/18  1:51 AM  Result Value Ref Range   WBC 5.7 4.0 - 10.5 K/uL   RBC 3.50 (L) 4.22 - 5.81 MIL/uL   Hemoglobin 5.5 (LL) 13.0 - 17.0 g/dL   HCT 22.3 (L) 39.0 - 52.0 %   MCV 63.7 (L) 78.0 - 100.0 fL   MCH 15.7 (L) 26.0 - 34.0 pg   MCHC 24.7 (L) 30.0 - 36.0 g/dL   RDW 20.0 (H) 11.5 - 15.5 %   Platelets 316 150 - 400 K/uL  POC occult blood, ED Provider will collect     Status: None   Collection Time: 01/10/18  4:16 AM  Result Value Ref Range   Fecal Occult Bld NEGATIVE NEGATIVE  Prepare RBC     Status: None   Collection Time: 01/10/18  4:33 AM  Result Value Ref Range   Order Confirmation      ORDER PROCESSED BY BLOOD BANK Performed at [facility], [place] [address], [place], [place] [address]   CBC  with Differential/Platelet     Status: Abnormal   Collection Time: 01/10/18  4:33 AM  Result Value Ref Range   WBC 6.9 4.0 - 10.5 K/uL   RBC 3.54 (L) 4.22 - 5.81 MIL/uL   Hemoglobin 5.7 (LL) 13.0 - 17.0 g/dL   HCT 22.6 (L) 39.0 - 52.0 %   MCV 63.8 (L) 78.0 - 100.0 fL   MCH 16.1 (L) 26.0 - 34.0 pg   MCHC 25.2 (L) 30.0 - 36.0 g/dL   RDW 19.8 (H) 11.5 - 15.5 %   Platelets 328 150 - 400 K/uL   Neutrophils Relative % 83 %   Lymphocytes Relative 13 %   Monocytes Relative 4 %   Eosinophils Relative 0 %   Basophils Relative 0 %   Neutro Abs 5.7 1.7 - 7.7 K/uL   Lymphs Abs 0.9 0.7 - 4.0 K/uL   Monocytes Absolute 0.3 0.1 - 1.0 K/uL   Eosinophils Absolute 0.0 0.0 - 0.7 K/uL   Basophils Absolute 0.0 0.0 - 0.1 K/uL   RBC Morphology TARGET CELLS   Type and screen [place]     Status: None (Preliminary result)   Collection Time: 01/10/18  4:35 AM  Result Value Ref Range   ABO/RH(D) O POS    Antibody Screen NEG    Sample Expiration 01/13/2018    Unit Number [id number]    Blood Component Type RED CELLS,LR    Unit division 00    Status of Unit ISSUED    Transfusion Status OK TO TRANSFUSE    Crossmatch Result      Compatible Performed at [facility] [address], [place], [place] [address]    Unit Number [id number]    Blood Component Type RED CELLS,LR  Unit division 00    Status of Unit ALLOCATED    Transfusion Status OK TO TRANSFUSE    Crossmatch Result Compatible    Imaging Studies: Ct Head Wo Contrast  Result Date: 01/10/2018 CLINICAL DATA:  Patient became unresponsive and vomited. Lethargy. Left-sided leg weakness. EXAM: CT HEAD WITHOUT CONTRAST TECHNIQUE: Contiguous axial images were obtained from the base of the skull through the vertex without intravenous contrast. COMPARISON:  10/26/2017 head CT FINDINGS: Brain: Patchy chronic appearing small vessel ischemic disease. Chronic pontine and left basal ganglial lacunar infarcts. No  large vascular territory infarct, hemorrhage or midline shift. No intra-axial mass nor extra-axial fluid collections. Superficial atrophy is noted as before. Vascular: No hyperdense vessel sign. Skull: Intact bony calvarium.  No soft tissue swelling of the scalp. Sinuses/Orbits: Clear paranasal sinuses.  Intact orbits and globes. Other: None IMPRESSION: Atrophy with chronic small vessel ischemia. No acute intracranial abnormality. Electronically Signed   By: Ashley Royalty M.D.   On: 01/10/2018 03:39    ED COURSE and MDM  Nursing notes and initial vitals signs, including pulse oximetry, reviewed.  Vitals:   01/10/18 0500 01/10/18 0602 01/10/18 0603 01/10/18 0625  BP: 137/74  (!) 164/77 (!) 142/69  Pulse: 63  66 60  Resp: (!) 21  16 18   Temp:   98.2 F (36.8 C) 98.2 F (36.8 C)  TempSrc:   Oral Oral  SpO2: 100%  96% 100%  Weight:  90.6 kg (199 lb 11.8 oz)    Height:  5\' 9"  (1.753 m)     4:19 AM Patient's syncopal episode likely due to his significant anemia.  His hemoglobin is 5.5.  His most recent hemoglobin from July of last year was 11.6.  The cause of his anemia is unclear but he is profoundly microcytic.  He is Hemoccult negative.  Two units of packed red blood cells have been ordered for transfusion.  Blood pressure is currently 157/84.  PROCEDURES   CRITICAL CARE Performed by: Shanon Rosser L Total critical care time: 30 minutes Critical care time was exclusive of separately billable procedures and treating other patients. Critical care was necessary to treat or prevent imminent or life-threatening deterioration. Critical care was time spent personally by me on the following activities: development of treatment plan with patient and/or surrogate as well as nursing, discussions with consultants, evaluation of patient's response to treatment, examination of patient, obtaining history from patient or surrogate, ordering and performing treatments and interventions, ordering and review of  laboratory studies, ordering and review of radiographic studies, pulse oximetry and re-evaluation of patient's condition.   ED DIAGNOSES     ICD-10-CM   1. Syncope and collapse R55   2. Microcytic anemia D50.9        Ardean Melroy, MD 01/10/18 [phone]

## 2018-01-10 NOTE — Consult Note (Addendum)
Consultation  Referring Provider:  Dr. Maylene Roes    Primary Care Physician:  Billie Ruddy, MD Primary Gastroenterologist: Dr. Loletha Carrow  Reason for Consultation: Symptomatic Anemia             HPI:   [name] is a 67 y.o. male with a past medical history as listed below including iron deficiency anemia dating back to 2010, chronic anemia with baseline hemoglobin 9-11, previous stroke with residual left-sided weakness, hypertension and depression, who presented to the ER on 01/10/2018 with a complaint of a syncopal episode.  We were consulted in regards to hemoglobin of 5.5.    Today, found in his bed with nurses by bedside, is agitated that he has not been able to eat today.  Explains that he has been having daily heartburn for at least 6-8 months and "no one is doing anything about it".  Tells me he has heartburn even when he drinks water.  Denies seeing any bright red blood or black tarry stools.  Patient tells me he was just sitting on his couch when he "passed out".  Overall has just been feeling slightly weaker.    Patient does describe vague history of pill capsule endoscopy but this was in 20007/2008-I cannot find a report.    Denies fever, chills, use of NSAIDs, changes in medication, change in bowel habits or weight loss.  ED Course: Initial blood pressure 88/60 improved to 157/84 in the ED, labs revealed Hgb 5.5, FOBT negative, CT head without acute intracranial abnormality. Ordered 2u pRBC transfusion.   Previous GI history: 10/26/2015 EGD Dr. Loletha Carrow: Positive stool and unexplained iron deficiency anemia; impression: Medium sized hiatal hernia otherwise normal 10/26/2015 colonoscopy Dr. Loletha Carrow: Done for heme positive stool and unexplained iron deficiency anemia; impression: Internal hemorrhoids and otherwise normal, more so but no source of blood loss found, dating back to least 2010 at that time patient had been taking aspirin NSAIDs are obscure small bowel ulceration culprit repeat  colonoscopy recommended in 5 years for screening; he was recommended patient continue with patient have a small bowel x-ray series to rule out a small bowel stricture (multiple prior surgery) prior to be a capsule study.  Past Medical History:  Diagnosis Date  . Arthritis   . Chronic back pain   . GERD (gastroesophageal reflux disease)    uses Baking Soda  . GIB (gastrointestinal bleeding) 2012  . Glaucoma    right eye  . Headache    occasionally  . Hepatitis C   . History of blood transfusion    no abnormal reaction noted  . History of gout    doesn't take any meds  . Hyperlipidemia    not on any meds  . Hypertension    takes Amlodipine and Lisinopril daily  . Insomnia    takes Ambien nightly  . Ischemic colitis ([place]) 2012  . Joint pain   . Nocturia   . Numbness    both legs occasionally  . PONV (postoperative nausea and vomiting)   . Prostate cancer ([place])   . Shortness of breath dyspnea    rarely but when notices he can be lying/sitting/exertion.Dr.Hochrein is aware per pt  . Stroke ([place]) 2016   . Urinary frequency   . Urinary urgency     Past Surgical History:  Procedure Laterality Date  . APPENDECTOMY    . COLONOSCOPY    . COLONOSCOPY N/A 10/26/2015   Procedure: COLONOSCOPY;  Surgeon: Doran Stabler, MD;  Location: [facility] ENDOSCOPY;  Service: Endoscopy;  Laterality: N/A;  . ESOPHAGOGASTRODUODENOSCOPY    . ESOPHAGOGASTRODUODENOSCOPY N/A 10/26/2015   Procedure: ESOPHAGOGASTRODUODENOSCOPY (EGD);  Surgeon: Doran Stabler, MD;  Location: [facility] ENDOSCOPY;  Service: Endoscopy;  Laterality: N/A;  . INGUINAL HERNIA REPAIR Right 11/04/2014   Procedure: RIGHT INGUINAL HERNIA REPAIR WITH MESH;  Surgeon: Jackolyn Confer, MD;  Location: [place];  Service: General;  Laterality: Right;  . MASS EXCISION N/A 11/04/2014   Procedure: REMOVAL OF RIGHT GROIN SOFT TISSUE MASS;  Surgeon: Jackolyn Confer, MD;  Location: [place];  Service: General;  Laterality: N/A;  . Multiple abdominal surgeries      total of 13  . PROSTATECTOMY    . SMALL INTESTINE SURGERY      Family History  Problem Relation Age of Onset  . Alzheimer's disease Father   . Cancer Mother        Lymph node  . Heart failure Brother 45       Transplant 13 years ago  . CAD Brother   . Heart disease Sister 31       MI   Colon Cancer-sister  Social History   Tobacco Use  . Smoking status: Current Every Day Smoker    Packs/day: 0.50    Years: 50.00    Pack years: 25.00    Types: Cigarettes  . Smokeless tobacco: Never Used  Substance Use Topics  . Alcohol use: Yes    Alcohol/week: 0.0 oz    Comment: occasionally  . Drug use: No    Frequency: 5.0 times per week    Prior to Admission medications   Medication Sig Start Date End Date Taking? Authorizing Provider  acetaminophen (TYLENOL) 325 MG tablet Take 2 tablets (650 mg total) by mouth every 6 (six) hours as needed for mild pain or moderate pain. 07/26/17   Waynetta Pean, PA-C  amLODipine (NORVASC) 10 MG tablet Take 1 tablet (10 mg total) by mouth daily. 10/18/17   [name], MD  cloNIDine (CATAPRES) 0.1 MG tablet Take 0.1 mg by mouth daily.    [provider]  Diclofenac Sodium 3 % GEL Place 1 application onto the skin 2 (two) times daily. To affected area. 07/26/17   Waynetta Pean, PA-C  DULoxetine (CYMBALTA) 30 MG capsule Take 1 capsule (30 mg total) by mouth 2 (two) times daily. 10/18/17   [name], MD  hydrochlorothiazide (MICROZIDE) 12.5 MG capsule Take 1 capsule (12.5 mg total) by mouth daily. 10/18/17   [name], MD  HYDROcodone-acetaminophen (NORCO/VICODIN) 5-325 MG tablet Take 1 tablet by mouth every 6 (six) hours as needed for moderate pain. 12/01/17   [name], MD  hydrOXYzine (ATARAX/VISTARIL) 25 MG tablet Take 1-2 tablets (25-50 mg total) by mouth at bedtime as needed (sleep). 08/29/17   [name], MD  lisinopril (PRINIVIL,ZESTRIL) 40 MG tablet Take 1 tablet (40 mg total) by mouth daily. 10/18/17    [name], MD  omeprazole (PRILOSEC) 20 MG capsule Take 1 capsule (20 mg total) by mouth daily. 01/05/18   Billie Ruddy, MD    Current Facility-Administered Medications  Medication Dose Route Frequency Provider Last Rate Last Dose  . acetaminophen (TYLENOL) tablet 650 mg  650 mg Oral Q6H PRN Dessa Phi, DO       Or  . acetaminophen (TYLENOL) suppository 650 mg  650 mg Rectal Q6H PRN Dessa Phi, DO      . DULoxetine (CYMBALTA) DR capsule 30 mg  30  mg Oral BID Dessa Phi, DO      . ferumoxytol [facility]) 510 mg in sodium chloride 0.9 % 100 mL IVPB  510 mg Intravenous Once Dessa Phi, DO      . ondansetron [facility]) tablet 4 mg  4 mg Oral Q6H PRN Dessa Phi, DO       Or  . ondansetron [facility]) injection 4 mg  4 mg Intravenous Q6H PRN Dessa Phi, DO        Allergies as of 01/10/2018 - Review Complete 01/10/2018  Allergen Reaction Noted  . Penicillins Anaphylaxis 03/02/2011  . Pseudoephedrine hcl  12/12/2013  . Sudafed [pseudoephedrine] Other (See Comments) 11/04/2014  . Trazodone and nefazodone Swelling 12/08/2015     Review of Systems:    Constitutional: No weight loss, fever or chills Skin: No rash  Cardiovascular: No chest pain Respiratory: No SOB Gastrointestinal: See HPI and otherwise negative Genitourinary: No dysuria  Neurological: No headache Musculoskeletal: No new muscle or joint pain Hematologic: No bleeding  Psychiatric: No history of depression or anxiety   Physical Exam:  Vital signs in last 24 hours: Temp:  [98.2 F (36.8 C)-98.5 F (36.9 C)] 98.2 F (36.8 C) (06/25 0858) Pulse Rate:  [59-70] 59 (06/25 0858) Resp:  [16-27] 18 (06/25 0858) BP: (88-164)/(60-84) 157/76 (06/25 0858) SpO2:  [96 %-100 %] 97 % (06/25 0858) Weight:  [199 lb 11.8 oz (90.6 kg)-230 lb (104.3 kg)] 199 lb 11.8 oz (90.6 kg) (06/25 0602) Last BM Date: 01/09/18 General:   AA male appears to be in NAD, Well developed, Well nourished, alert and  cooperative Head:  Normocephalic and atraumatic. Eyes:   PEERL, EOMI. No icterus. Conjunctiva pink. Ears:  Normal auditory acuity. Neck:  Supple Throat: Oral cavity and pharynx without inflammation, swelling or lesion.  Lungs: Respirations even and unlabored. Lungs clear to auscultation bilaterally.   No wheezes, crackles, or rhonchi.  Heart: Normal S1, S2. No MRG. Regular rate and rhythm. No peripheral edema, cyanosis or pallor.  Abdomen:  Soft, nondistended, nontender. No rebound or guarding. Normal bowel sounds. No appreciable masses or hepatomegaly. Rectal:  Not performed.  Msk:  Symmetrical without gross deformities. Peripheral pulses intact.  Extremities:  Without edema, no deformity or joint abnormality. Normal ROM, normal sensation. Neurologic:  Alert and  oriented x4;  grossly normal neurologically.   Skin:   Dry and intact without significant lesions or rashes. Psychiatric: Demonstrates good judgement and reason without abnormal affect or behaviors.   LAB RESULTS: Recent Labs    01/10/18 0151 01/10/18 0433  WBC 5.7 6.9  HGB 5.5* 5.7*  HCT 22.3* 22.6*  PLT 316 328   BMET Recent Labs    01/10/18 0151  NA 142  K 3.6  CL 110  CO2 24  GLUCOSE 105*  BUN 20  CREATININE 1.14  CALCIUM 9.1   STUDIES: Ct Head Wo Contrast  Result Date: 01/10/2018 CLINICAL DATA:  Patient became unresponsive and vomited. Lethargy. Left-sided leg weakness. EXAM: CT HEAD WITHOUT CONTRAST TECHNIQUE: Contiguous axial images were obtained from the base of the skull through the vertex without intravenous contrast. COMPARISON:  10/26/2017 head CT FINDINGS: Brain: Patchy chronic appearing small vessel ischemic disease. Chronic pontine and left basal ganglial lacunar infarcts. No large vascular territory infarct, hemorrhage or midline shift. No intra-axial mass nor extra-axial fluid collections. Superficial atrophy is noted as before. Vascular: No hyperdense vessel sign. Skull: Intact bony calvarium.   No soft tissue swelling of the scalp. Sinuses/Orbits: Clear paranasal sinuses.  Intact orbits and  globes. Other: None IMPRESSION: Atrophy with chronic small vessel ischemia. No acute intracranial abnormality. Electronically Signed   By: Ashley Royalty M.D.   On: 01/10/2018 03:39    Impression / Plan:   Impression: 1.  Symptomatic anemia: hgb 5.7, With syncopal episode, chronic iron deficiency anemia in 2010, baseline-11, EGD and colonoscopy in 2017 without acute findings to explain chronic anemia, FOBT negative, patient currently receiving 2 units PRBCs, ferritin low, iron 7, TIBC 517, IV Ferritin has been ordered 2.  Syncope: Due to above  Plan: 1.  Started patient on IV PPI twice daily 2.  Ordered repeat EGD tomorrow with Dr. Carlean Purl.  Did discuss risk, benefits, limitations alternatives and the patient agrees to proceed.  If this is normal/negative would recommend small bowel follow-through and possible pill capsule endoscopy. 3.  Procedures will not be done today.  We will start patient on a regular diet, NPO after midnight. 4.  Agree with blood transfusion and continued monitoring of hemoglobin transfusion less than 7 in the future. 5.  Please await further recommendations from Dr. Carlean Purl.  Thank you for your kind consultation, we will continue to follow.  Lavone Nian Lemmon  01/10/2018, 9:21 AM Pager #: [phone]     [facility] Attending   I have taken an interval history, reviewed the chart and examined the patient. I agree with the Advanced Practitioner's note, impression and recommendations.  Iron def anemia and heartburn - EGD tomorrow  Gatha Mayer, MD, [facility] 01/10/2018 3:13 PM

## 2018-01-10 NOTE — Progress Notes (Addendum)
Patient requested a medication to help him sleep. Paged K. Schorr. New order for one time dose of ambien ordered and given. Ambien was ineffective. He was unable to get a good night's rest.

## 2018-01-10 NOTE — ED Triage Notes (Addendum)
Patient coming from home. At 1230am patient having conversation with wife and she witnessed him go unresponsive and then vomited on self. Per wife, this lasted about 1 minute. When EMS arrived, patient was very lethargic and slow to respond. Neuro assessment showed left sided weakness but wife states that this is his baseline due to prior CVAs. EMS stated that only noted left leg weakness. Wife stated no seizures. Patient did admit to EMS that he took his wife's seroquel to sleep. Other than that, wife stated no other sickness or symptoms noted. Per EMS, patient is now A&O x4 with left leg weakness. Patient usually walks at home with a walker per EMS.

## 2018-01-10 NOTE — ED Notes (Signed)
Bed: WA02 Expected date:  Expected time:  Means of arrival:  Comments: EMS-syncope 

## 2018-01-10 NOTE — Progress Notes (Signed)
CRITICAL VALUE ALERT  Critical Value:  Hgb 5.5  Date & Time Notied:  01-10-18 348  Provider Notified: Dr. Florina Ou  Orders Received/Actions taken: New orders in [facility]

## 2018-01-10 NOTE — H&P (View-Only) (Signed)
Consultation  Referring Provider:  Dr. Maylene Roes    Primary Care Physician:  Billie Ruddy, MD Primary Gastroenterologist: Dr. Loletha Carrow  Reason for Consultation: Symptomatic Anemia             HPI:   [name] is a 67 y.o. male with a past medical history as listed below including iron deficiency anemia dating back to 2010, chronic anemia with baseline hemoglobin 9-11, previous stroke with residual left-sided weakness, hypertension and depression, who presented to the ER on 01/10/2018 with a complaint of a syncopal episode.  We were consulted in regards to hemoglobin of 5.5.    Today, found in his bed with nurses by bedside, is agitated that he has not been able to eat today.  Explains that he has been having daily heartburn for at least 6-8 months and "no one is doing anything about it".  Tells me he has heartburn even when he drinks water.  Denies seeing any bright red blood or black tarry stools.  Patient tells me he was just sitting on his couch when he "passed out".  Overall has just been feeling slightly weaker.    Patient does describe vague history of pill capsule endoscopy but this was in 20007/2008-I cannot find a report.    Denies fever, chills, use of NSAIDs, changes in medication, change in bowel habits or weight loss.  ED Course: Initial blood pressure 88/60 improved to 157/84 in the ED, labs revealed Hgb 5.5, FOBT negative, CT head without acute intracranial abnormality. Ordered 2u pRBC transfusion.   Previous GI history: 10/26/2015 EGD Dr. Loletha Carrow: Positive stool and unexplained iron deficiency anemia; impression: Medium sized hiatal hernia otherwise normal 10/26/2015 colonoscopy Dr. Loletha Carrow: Done for heme positive stool and unexplained iron deficiency anemia; impression: Internal hemorrhoids and otherwise normal, more so but no source of blood loss found, dating back to least 2010 at that time patient had been taking aspirin NSAIDs are obscure small bowel ulceration culprit repeat  colonoscopy recommended in 5 years for screening; he was recommended patient continue with patient have a small bowel x-ray series to rule out a small bowel stricture (multiple prior surgery) prior to be a capsule study.  Past Medical History:  Diagnosis Date  . Arthritis   . Chronic back pain   . GERD (gastroesophageal reflux disease)    uses Baking Soda  . GIB (gastrointestinal bleeding) 2012  . Glaucoma    right eye  . Headache    occasionally  . Hepatitis C   . History of blood transfusion    no abnormal reaction noted  . History of gout    doesn't take any meds  . Hyperlipidemia    not on any meds  . Hypertension    takes Amlodipine and Lisinopril daily  . Insomnia    takes Ambien nightly  . Ischemic colitis ([place]) 2012  . Joint pain   . Nocturia   . Numbness    both legs occasionally  . PONV (postoperative nausea and vomiting)   . Prostate cancer ([place])   . Shortness of breath dyspnea    rarely but when notices he can be lying/sitting/exertion.Dr.Hochrein is aware per pt  . Stroke ([place]) 2016   . Urinary frequency   . Urinary urgency     Past Surgical History:  Procedure Laterality Date  . APPENDECTOMY    . COLONOSCOPY    . COLONOSCOPY N/A 10/26/2015   Procedure: COLONOSCOPY;  Surgeon: Doran Stabler, MD;  Location: [facility] ENDOSCOPY;  Service: Endoscopy;  Laterality: N/A;  . ESOPHAGOGASTRODUODENOSCOPY    . ESOPHAGOGASTRODUODENOSCOPY N/A 10/26/2015   Procedure: ESOPHAGOGASTRODUODENOSCOPY (EGD);  Surgeon: Doran Stabler, MD;  Location: [facility] ENDOSCOPY;  Service: Endoscopy;  Laterality: N/A;  . INGUINAL HERNIA REPAIR Right 11/04/2014   Procedure: RIGHT INGUINAL HERNIA REPAIR WITH MESH;  Surgeon: Jackolyn Confer, MD;  Location: [place];  Service: General;  Laterality: Right;  . MASS EXCISION N/A 11/04/2014   Procedure: REMOVAL OF RIGHT GROIN SOFT TISSUE MASS;  Surgeon: Jackolyn Confer, MD;  Location: [place];  Service: General;  Laterality: N/A;  . Multiple abdominal surgeries      total of 13  . PROSTATECTOMY    . SMALL INTESTINE SURGERY      Family History  Problem Relation Age of Onset  . Alzheimer's disease Father   . Cancer Mother        Lymph node  . Heart failure Brother 45       Transplant 13 years ago  . CAD Brother   . Heart disease Sister 60       MI   Colon Cancer-sister  Social History   Tobacco Use  . Smoking status: Current Every Day Smoker    Packs/day: 0.50    Years: 50.00    Pack years: 25.00    Types: Cigarettes  . Smokeless tobacco: Never Used  Substance Use Topics  . Alcohol use: Yes    Alcohol/week: 0.0 oz    Comment: occasionally  . Drug use: No    Frequency: 5.0 times per week    Prior to Admission medications   Medication Sig Start Date End Date Taking? Authorizing Provider  acetaminophen (TYLENOL) 325 MG tablet Take 2 tablets (650 mg total) by mouth every 6 (six) hours as needed for mild pain or moderate pain. 07/26/17   Waynetta Pean, PA-C  amLODipine (NORVASC) 10 MG tablet Take 1 tablet (10 mg total) by mouth daily. 10/18/17   [name], MD  cloNIDine (CATAPRES) 0.1 MG tablet Take 0.1 mg by mouth daily.    [provider]  Diclofenac Sodium 3 % GEL Place 1 application onto the skin 2 (two) times daily. To affected area. 07/26/17   Waynetta Pean, PA-C  DULoxetine (CYMBALTA) 30 MG capsule Take 1 capsule (30 mg total) by mouth 2 (two) times daily. 10/18/17   [name], MD  hydrochlorothiazide (MICROZIDE) 12.5 MG capsule Take 1 capsule (12.5 mg total) by mouth daily. 10/18/17   [name], MD  HYDROcodone-acetaminophen (NORCO/VICODIN) 5-325 MG tablet Take 1 tablet by mouth every 6 (six) hours as needed for moderate pain. 12/01/17   [name], MD  hydrOXYzine (ATARAX/VISTARIL) 25 MG tablet Take 1-2 tablets (25-50 mg total) by mouth at bedtime as needed (sleep). 08/29/17   [name], MD  lisinopril (PRINIVIL,ZESTRIL) 40 MG tablet Take 1 tablet (40 mg total) by mouth daily. 10/18/17    [name], MD  omeprazole (PRILOSEC) 20 MG capsule Take 1 capsule (20 mg total) by mouth daily. 01/05/18   Billie Ruddy, MD    Current Facility-Administered Medications  Medication Dose Route Frequency Provider Last Rate Last Dose  . acetaminophen (TYLENOL) tablet 650 mg  650 mg Oral Q6H PRN Dessa Phi, DO       Or  . acetaminophen (TYLENOL) suppository 650 mg  650 mg Rectal Q6H PRN Dessa Phi, DO      . DULoxetine (CYMBALTA) DR capsule 30 mg  30  mg Oral BID Dessa Phi, DO      . ferumoxytol [facility]) 510 mg in sodium chloride 0.9 % 100 mL IVPB  510 mg Intravenous Once Dessa Phi, DO      . ondansetron [facility]) tablet 4 mg  4 mg Oral Q6H PRN Dessa Phi, DO       Or  . ondansetron [facility]) injection 4 mg  4 mg Intravenous Q6H PRN Dessa Phi, DO        Allergies as of 01/10/2018 - Review Complete 01/10/2018  Allergen Reaction Noted  . Penicillins Anaphylaxis 03/02/2011  . Pseudoephedrine hcl  12/12/2013  . Sudafed [pseudoephedrine] Other (See Comments) 11/04/2014  . Trazodone and nefazodone Swelling 12/08/2015     Review of Systems:    Constitutional: No weight loss, fever or chills Skin: No rash  Cardiovascular: No chest pain Respiratory: No SOB Gastrointestinal: See HPI and otherwise negative Genitourinary: No dysuria  Neurological: No headache Musculoskeletal: No new muscle or joint pain Hematologic: No bleeding  Psychiatric: No history of depression or anxiety   Physical Exam:  Vital signs in last 24 hours: Temp:  [98.2 F (36.8 C)-98.5 F (36.9 C)] 98.2 F (36.8 C) (06/25 0858) Pulse Rate:  [59-70] 59 (06/25 0858) Resp:  [16-27] 18 (06/25 0858) BP: (88-164)/(60-84) 157/76 (06/25 0858) SpO2:  [96 %-100 %] 97 % (06/25 0858) Weight:  [199 lb 11.8 oz (90.6 kg)-230 lb (104.3 kg)] 199 lb 11.8 oz (90.6 kg) (06/25 0602) Last BM Date: 01/09/18 General:   AA male appears to be in NAD, Well developed, Well nourished, alert and  cooperative Head:  Normocephalic and atraumatic. Eyes:   PEERL, EOMI. No icterus. Conjunctiva pink. Ears:  Normal auditory acuity. Neck:  Supple Throat: Oral cavity and pharynx without inflammation, swelling or lesion.  Lungs: Respirations even and unlabored. Lungs clear to auscultation bilaterally.   No wheezes, crackles, or rhonchi.  Heart: Normal S1, S2. No MRG. Regular rate and rhythm. No peripheral edema, cyanosis or pallor.  Abdomen:  Soft, nondistended, nontender. No rebound or guarding. Normal bowel sounds. No appreciable masses or hepatomegaly. Rectal:  Not performed.  Msk:  Symmetrical without gross deformities. Peripheral pulses intact.  Extremities:  Without edema, no deformity or joint abnormality. Normal ROM, normal sensation. Neurologic:  Alert and  oriented x4;  grossly normal neurologically.   Skin:   Dry and intact without significant lesions or rashes. Psychiatric: Demonstrates good judgement and reason without abnormal affect or behaviors.   LAB RESULTS: Recent Labs    01/10/18 0151 01/10/18 0433  WBC 5.7 6.9  HGB 5.5* 5.7*  HCT 22.3* 22.6*  PLT 316 328   BMET Recent Labs    01/10/18 0151  NA 142  K 3.6  CL 110  CO2 24  GLUCOSE 105*  BUN 20  CREATININE 1.14  CALCIUM 9.1   STUDIES: Ct Head Wo Contrast  Result Date: 01/10/2018 CLINICAL DATA:  Patient became unresponsive and vomited. Lethargy. Left-sided leg weakness. EXAM: CT HEAD WITHOUT CONTRAST TECHNIQUE: Contiguous axial images were obtained from the base of the skull through the vertex without intravenous contrast. COMPARISON:  10/26/2017 head CT FINDINGS: Brain: Patchy chronic appearing small vessel ischemic disease. Chronic pontine and left basal ganglial lacunar infarcts. No large vascular territory infarct, hemorrhage or midline shift. No intra-axial mass nor extra-axial fluid collections. Superficial atrophy is noted as before. Vascular: No hyperdense vessel sign. Skull: Intact bony calvarium.   No soft tissue swelling of the scalp. Sinuses/Orbits: Clear paranasal sinuses.  Intact orbits and  globes. Other: None IMPRESSION: Atrophy with chronic small vessel ischemia. No acute intracranial abnormality. Electronically Signed   By: Ashley Royalty M.D.   On: 01/10/2018 03:39    Impression / Plan:   Impression: 1.  Symptomatic anemia: hgb 5.7, With syncopal episode, chronic iron deficiency anemia in 2010, baseline-11, EGD and colonoscopy in 2017 without acute findings to explain chronic anemia, FOBT negative, patient currently receiving 2 units PRBCs, ferritin low, iron 7, TIBC 517, IV Ferritin has been ordered 2.  Syncope: Due to above  Plan: 1.  Started patient on IV PPI twice daily 2.  Ordered repeat EGD tomorrow with Dr. Carlean Purl.  Did discuss risk, benefits, limitations alternatives and the patient agrees to proceed.  If this is normal/negative would recommend small bowel follow-through and possible pill capsule endoscopy. 3.  Procedures will not be done today.  We will start patient on a regular diet, NPO after midnight. 4.  Agree with blood transfusion and continued monitoring of hemoglobin transfusion less than 7 in the future. 5.  Please await further recommendations from Dr. Carlean Purl.  Thank you for your kind consultation, we will continue to follow.  [name]  01/10/2018, 9:21 AM Pager #: [phone]     [facility] Attending   I have taken an interval history, reviewed the chart and examined the patient. I agree with the Advanced Practitioner's note, impression and recommendations.  Iron def anemia and heartburn - EGD tomorrow  Gatha Mayer, MD, [facility] 01/10/2018 3:13 PM

## 2018-01-11 ENCOUNTER — Encounter (HOSPITAL_COMMUNITY)
Admission: EM | Disposition: A | Discharge status: Home / Self Care | Payer: Self-pay | Source: Home / Self Care | Attending: Internal Medicine

## 2018-01-11 ENCOUNTER — Encounter (HOSPITAL_COMMUNITY): Payer: Self-pay | Admitting: *Deleted

## 2018-01-11 DIAGNOSIS — K257 Chronic gastric ulcer without hemorrhage or perforation: Secondary | ICD-10-CM | POA: Diagnosis present

## 2018-01-11 DIAGNOSIS — K2970 Gastritis, unspecified, without bleeding: Secondary | ICD-10-CM

## 2018-01-11 DIAGNOSIS — K449 Diaphragmatic hernia without obstruction or gangrene: Secondary | ICD-10-CM

## 2018-01-11 DIAGNOSIS — K210 Gastro-esophageal reflux disease with esophagitis: Secondary | ICD-10-CM

## 2018-01-11 HISTORY — PX: ESOPHAGOGASTRODUODENOSCOPY (EGD) WITH PROPOFOL: SHX5813

## 2018-01-11 HISTORY — DX: Chronic gastric ulcer without hemorrhage or perforation: K25.7

## 2018-01-11 HISTORY — PX: BIOPSY: SHX5522

## 2018-01-11 LAB — BPAM RBC
Blood Product Expiration Date: 201907192359
Blood Product Expiration Date: 201907192359
ISSUE DATE / TIME: 201906250602
ISSUE DATE / TIME: 201906250958
Unit Type and Rh: 5100
Unit Type and Rh: 5100

## 2018-01-11 LAB — TYPE AND SCREEN
ABO/RH(D): O POS
Antibody Screen: NEGATIVE
Unit division: 0
Unit division: 0

## 2018-01-11 LAB — CBC
HCT: 29.9 % — ABNORMAL LOW (ref 39.0–52.0)
Hemoglobin: 8.4 g/dL — ABNORMAL LOW (ref 13.0–17.0)
MCH: 18.8 pg — ABNORMAL LOW (ref 26.0–34.0)
MCHC: 28.1 g/dL — ABNORMAL LOW (ref 30.0–36.0)
MCV: 67 fL — ABNORMAL LOW (ref 78.0–100.0)
Platelets: 294 10*3/uL (ref 150–400)
RBC: 4.46 MIL/uL (ref 4.22–5.81)
RDW: 22.7 % — ABNORMAL HIGH (ref 11.5–15.5)
WBC: 5.6 10*3/uL (ref 4.0–10.5)

## 2018-01-11 LAB — BASIC METABOLIC PANEL
Anion gap: 8 (ref 5–15)
BUN: 14 mg/dL (ref 8–23)
CO2: 24 mmol/L (ref 22–32)
Calcium: 9.3 mg/dL (ref 8.9–10.3)
Chloride: 110 mmol/L (ref 98–111)
Creatinine, Ser: 0.98 mg/dL (ref 0.61–1.24)
GFR calc Af Amer: >60 mL/min (ref >60)
GFR calc non Af Amer: >60 mL/min (ref >60)
Glucose, Bld: 99 mg/dL (ref 70–99)
Potassium: 3.9 mmol/L (ref 3.5–5.1)
Sodium: 142 mmol/L (ref 135–145)

## 2018-01-11 LAB — HEPATIC FUNCTION PANEL
ALT: 9 U/L (ref 0–44)
AST: 13 U/L — ABNORMAL LOW (ref 15–41)
Albumin: 4.1 g/dL (ref 3.5–5.0)
Alkaline Phosphatase: 92 U/L (ref 38–126)
Bilirubin, Direct: 0.2 mg/dL (ref 0.0–0.2)
Indirect Bilirubin: 1 mg/dL — ABNORMAL HIGH (ref 0.3–0.9)
Total Bilirubin: 1.2 mg/dL (ref 0.3–1.2)
Total Protein: 7.3 g/dL (ref 6.5–8.1)

## 2018-01-11 LAB — VITAMIN B12: Vitamin B-12: 354 pg/mL (ref 180–914)

## 2018-01-11 LAB — HIV ANTIBODY (ROUTINE TESTING W REFLEX): HIV Screen 4th Generation wRfx: NONREACTIVE

## 2018-01-11 LAB — GLUCOSE, CAPILLARY: Glucose-Capillary: 113 mg/dL — ABNORMAL HIGH (ref 70–99)

## 2018-01-11 LAB — TSH: TSH: 1.51 u[IU]/mL (ref 0.350–4.500)

## 2018-01-11 SURGERY — ESOPHAGOGASTRODUODENOSCOPY (EGD) WITH PROPOFOL
Anesthesia: Moderate Sedation

## 2018-01-11 MED ORDER — DIPHENHYDRAMINE HCL 50 MG PO CAPS
50.0000 mg | ORAL_CAPSULE | Freq: Every evening | ORAL | Status: DC | PRN
  Administered 2018-01-11 – 2018-01-12 (×2): 50 mg via ORAL
  Filled 2018-01-11 (×2): qty 1

## 2018-01-11 MED ORDER — FENTANYL CITRATE (PF) 100 MCG/2ML IJ SOLN
INTRAMUSCULAR | Status: DC | PRN
  Administered 2018-01-11 (×3): 25 ug via INTRAVENOUS

## 2018-01-11 MED ORDER — SODIUM CHLORIDE 0.9 % IV SOLN
510.0000 mg | Freq: Once | INTRAVENOUS | Status: AC
  Administered 2018-01-11: 510 mg via INTRAVENOUS
  Filled 2018-01-11: qty 17

## 2018-01-11 MED ORDER — CLONIDINE HCL 0.1 MG PO TABS
0.1000 mg | ORAL_TABLET | Freq: Every day | ORAL | Status: DC
  Administered 2018-01-12 – 2018-01-13 (×2): 0.1 mg via ORAL
  Filled 2018-01-11 (×2): qty 1

## 2018-01-11 MED ORDER — CLONIDINE HCL 0.1 MG PO TABS
0.1000 mg | ORAL_TABLET | Freq: Three times a day (TID) | ORAL | Status: DC
  Administered 2018-01-11 (×2): 0.1 mg via ORAL
  Filled 2018-01-11 (×2): qty 1

## 2018-01-11 MED ORDER — BUTAMBEN-TETRACAINE-BENZOCAINE 2-2-14 % EX AERO
INHALATION_SPRAY | CUTANEOUS | Status: DC | PRN
  Administered 2018-01-11: 2 via TOPICAL

## 2018-01-11 MED ORDER — LISINOPRIL 20 MG PO TABS
20.0000 mg | ORAL_TABLET | Freq: Every day | ORAL | Status: DC
  Administered 2018-01-11: 20 mg via ORAL
  Filled 2018-01-11: qty 1

## 2018-01-11 MED ORDER — FERROUS SULFATE 325 (65 FE) MG PO TABS
325.0000 mg | ORAL_TABLET | Freq: Three times a day (TID) | ORAL | Status: DC
  Administered 2018-01-11 – 2018-01-13 (×6): 325 mg via ORAL
  Filled 2018-01-11 (×7): qty 1

## 2018-01-11 MED ORDER — DIPHENHYDRAMINE HCL 50 MG/ML IJ SOLN
INTRAMUSCULAR | Status: AC
  Filled 2018-01-11: qty 1

## 2018-01-11 MED ORDER — HYDROCHLOROTHIAZIDE 12.5 MG PO CAPS
12.5000 mg | ORAL_CAPSULE | Freq: Every day | ORAL | Status: DC
  Administered 2018-01-11 – 2018-01-13 (×3): 12.5 mg via ORAL
  Filled 2018-01-11 (×3): qty 1

## 2018-01-11 MED ORDER — POLYETHYLENE GLYCOL 3350 17 G PO PACK
17.0000 g | PACK | Freq: Every day | ORAL | Status: DC | PRN
  Administered 2018-01-12: 17 g via ORAL
  Filled 2018-01-11: qty 1

## 2018-01-11 MED ORDER — BISACODYL 5 MG PO TBEC: 5.0000 mg | DELAYED_RELEASE_TABLET | Freq: Every day | ORAL | Status: DC | PRN

## 2018-01-11 MED ORDER — SPOT INK MARKER SYRINGE KIT
PACK | SUBMUCOSAL | Status: AC
  Filled 2018-01-11: qty 5

## 2018-01-11 MED ORDER — FENTANYL CITRATE (PF) 100 MCG/2ML IJ SOLN
INTRAMUSCULAR | Status: AC
Start: 2018-01-11 — End: ?
  Filled 2018-01-11: qty 4

## 2018-01-11 MED ORDER — KETOROLAC TROMETHAMINE 30 MG/ML IJ SOLN
30.0000 mg | Freq: Once | INTRAMUSCULAR | Status: AC
  Administered 2018-01-11: 30 mg via INTRAVENOUS
  Filled 2018-01-11: qty 1

## 2018-01-11 MED ORDER — MIDAZOLAM HCL 5 MG/ML IJ SOLN
INTRAMUSCULAR | Status: AC
  Filled 2018-01-11: qty 3

## 2018-01-11 MED ORDER — MUSCLE RUB 10-15 % EX CREA
TOPICAL_CREAM | CUTANEOUS | Status: DC | PRN
  Administered 2018-01-12: 07:00:00 via TOPICAL
  Filled 2018-01-11: qty 85

## 2018-01-11 MED ORDER — MIDAZOLAM HCL 10 MG/2ML IJ SOLN
INTRAMUSCULAR | Status: DC | PRN
  Administered 2018-01-11: 2 mg via INTRAVENOUS
  Administered 2018-01-11: 1 mg via INTRAVENOUS
  Administered 2018-01-11 (×2): 2 mg via INTRAVENOUS

## 2018-01-11 MED ORDER — EPINEPHRINE PF 1 MG/10ML IJ SOSY
PREFILLED_SYRINGE | INTRAMUSCULAR | Status: AC
  Filled 2018-01-11: qty 10

## 2018-01-11 SURGICAL SUPPLY — 15 items

## 2018-01-11 NOTE — Op Note (Signed)
[facility] Patient Name: [name] Procedure Date: 01/11/2018 MRN: [id number] Attending MD: Gatha Mayer , MD Date of Birth: 21-Dec-1950 CSN: [id number] Age: 67 Admit Type: Inpatient Procedure:                Upper GI endoscopy Indications:              Iron deficiency anemia Providers:                Gatha Mayer, MD, Kingsley Plan, RN, Laurena Spies, Technician Referring MD:              Medicines:                Midazolam 7 mg IV, Fentanyl 75 micrograms IV,                            Cetacaine spray Complications:            No immediate complications. Estimated Blood Loss:     Estimated blood loss was minimal. Procedure:                Pre-Anesthesia Assessment:                           - Prior to the procedure, a History and Physical                            was performed, and patient medications and                            allergies were reviewed. The patient's tolerance of                            previous anesthesia was also reviewed. The risks                            and benefits of the procedure and the sedation                            options and risks were discussed with the patient.                            All questions were answered, and informed consent                            was obtained. Prior Anticoagulants: The patient has                            taken no previous anticoagulant or antiplatelet                            agents. ASA Grade Assessment: III - A patient with  severe systemic disease. After reviewing the risks                            and benefits, the patient was deemed in                            satisfactory condition to undergo the procedure.                           After obtaining informed consent, the endoscope was                            passed under direct vision. Throughout the                            procedure, the patient's  blood pressure, pulse, and                            oxygen saturations were monitored continuously. The                            [id number]) scope was introduced through the                            mouth, and advanced to the second part of duodenum.                            The upper GI endoscopy was somewhat difficult due                            to the patient's poor cooperation. Successful                            completion of the procedure was aided by increasing                            the dose of sedation medication. The patient                            tolerated the procedure fairly well. Scope In: Scope Out: Findings:      LA Grade B (one or more mucosal breaks greater than 5 mm, not extending       between the tops of two mucosal folds) esophagitis with no bleeding was       found in the distal esophagus.      A 10 cm hiatal hernia was found. The proximal extent of the gastric       folds (end of tubular esophagus) was 35 cm from the incisors. The hiatal       narrowing was 45 cm from the incisors. The Z-line was 35 cm from the       incisors.      Localized moderate inflammation characterized by erosions, erythema and       confluent ulcerations was found in the cardia. Biopsies were taken with  a cold forceps for histology. Verification of patient identification for       the specimen was done. Estimated blood loss was minimal.      A few localized, small non-bleeding erosions were found in the gastric       body. There were stigmata of recent bleeding.      The exam was otherwise without abnormality.      The cardia and gastric fundus were otherwise normal on retroflexion. Impression:               - LA Grade B reflux esophagitis.                           - 10 cm hiatal hernia.                           - Gastritis. Biopsied.                           - Non-bleeding erosive gastropathy.                           - The examination was  otherwise normal. Moderate Sedation:      Moderate (conscious) sedation was administered by the endoscopy nurse       and supervised by the endoscopist. The following parameters were       monitored: oxygen saturation, heart rate, blood pressure, respiratory       rate, EKG, adequacy of pulmonary ventilation, and response to care.       Total physician intraservice time was 13 minutes. Recommendation:           - Return patient to hospital ward for ongoing care.                           - UGI series tomorrow to assess hiatal hernia more                           Should be considered for a repair though elective                            not while here                           Stay on bid PPI                           feraheme x 1                           reflux precautions                           [place] Procedure Code(s):        --- Professional ---                           [phone], Esophagogastroduodenoscopy, flexible,                            transoral;  with biopsy, single or multiple                           G0500, Moderate sedation services provided by the                            same physician or other qualified health care                            professional performing a gastrointestinal                            endoscopic service that sedation supports,                            requiring the presence of an independent trained                            observer to assist in the monitoring of the                            patient's level of consciousness and physiological                            status; initial 15 minutes of intra-service time;                            patient age 69 years or older (additional time may                            be reported with [phone], as appropriate) Diagnosis Code(s):        --- Professional ---                           K21.0, Gastro-esophageal reflux disease with                            esophagitis                            K44.9, Diaphragmatic hernia without obstruction or                            gangrene                           K29.70, Gastritis, unspecified, without bleeding                           K31.89, Other diseases of stomach and duodenum                           D50.9, Iron deficiency anemia, unspecified CPT copyright 2017 [employer]. All rights reserved. The codes documented in this report are preliminary and upon coder review may  be revised to meet current compliance requirements. Gatha Mayer,  MD 01/11/2018 4:06:15 PM This report has been signed electronically. Number of Addenda: 0

## 2018-01-11 NOTE — Progress Notes (Addendum)
Patient's BP 196/92 manually. Patient has no S/S and no complaints. Gave 10mg  Iv hydralazine earlier w/ minimal results. Paged K. Schorr. New order for clonidine ordered and given. Also, gave 10 mg IV hydralazine. Continue to monitor.

## 2018-01-11 NOTE — Progress Notes (Signed)
PROGRESS NOTE    [name]  TDH:[id number] DOB: 1950-11-18 DOA: 01/10/2018 PCP: Billie Ruddy, MD   Brief Narrative:  67 year old male with history of iron deficiency anemia, previous stroke with residual left-sided weakness, essential hypertension, depression came to the hospital with complaints of a syncopal episode and symptomatic anemia.  Upon admission he was found to have hemoglobin of 5.5 with borderline systolic low blood pressure of 88 which improved with IV fluids.  CT of the head was negative.  He was transfused 2 units And gastroenterology was consulted.   Assessment & Plan:   Principal Problem:   Symptomatic anemia Active Problems:   HTN (hypertension)   Anemia, iron deficiency   Syncope  Syncope secondary to symptomatic anemia Iron deficiency anemia - Severely deficient in iron.  Received 1 unit of IV iron, started on oral iron 3 times daily - Currently patient is n.p.o. in anticipation for endoscopy/colonoscopy today -Depending on what endoscopy results shows will resume his diet - Appreciate GI input -Closely monitor hemoglobin -Status post 2 units of PRBC transfusion - TSH, B12 within normal limits, folate-pending -bowel regimen PRN   Essential hypertension -We will resume his home medications  History of depression -Stable, continue Cymbalta    DVT prophylaxis: SCDs Code Status: Full code Family Communication: Fianc bedside Disposition Plan: Maintain inpatient stay  Consultants:   Gastroenterology  Procedures:   None  Antimicrobials:   None   Subjective: No complaints this morning.  No acute events overnight.  Review of Systems Otherwise negative except as per HPI, including: General: Denies fever, chills, night sweats or unintended weight loss. Resp: Denies cough, wheezing, shortness of breath. Cardiac: Denies chest pain, palpitations, orthopnea, paroxysmal nocturnal dyspnea. GI: Denies abdominal pain, nausea, vomiting,  diarrhea or constipation GU: Denies dysuria, frequency, hesitancy or incontinence MS: Denies muscle aches, joint pain or swelling Neuro: Denies headache, neurologic deficits (focal weakness, numbness, tingling), abnormal gait Psych: Denies anxiety, depression, SI/HI/AVH Skin: Denies new rashes or lesions ID: Denies sick contacts, exotic exposures, travel  Objective: Vitals:   01/11/18 0420 01/11/18 0430 01/11/18 0618 01/11/18 0825  BP: (!) 198/81 (!) 196/92 (!) 184/77 135/81  Pulse: 63  71   Resp: (!) 24     Temp: 98 F (36.7 C)     TempSrc: Oral     SpO2: 98%  100%   Weight:      Height:        Intake/Output Summary (Last 24 hours) at 01/11/2018 1241 Last data filed at 01/11/2018 0830 Gross per 24 hour  Intake 1407 ml  Output 3575 ml  Net -2168 ml   Filed Weights   01/10/18 0147 01/10/18 0602  Weight: 104.3 kg (230 lb) 90.6 kg (199 lb 11.8 oz)    Examination:  General exam: Appears calm and comfortable  Respiratory system: Clear to auscultation. Respiratory effort normal. Cardiovascular system: S1 & S2 heard, RRR. No JVD, murmurs, rubs, gallops or clicks. No pedal edema. Gastrointestinal system: Abdomen is nondistended, soft and nontender. No organomegaly or masses felt. Normal bowel sounds heard. Central nervous system: Alert and oriented. No focal neurological deficits. Extremities: Symmetric 5 x 5 power. Skin: No rashes, lesions or ulcers Psychiatry: Judgement and insight appear normal. Mood & affect appropriate.     Data Reviewed:   CBC: Recent Labs  Lab 01/10/18 0151 01/10/18 0433 01/10/18 1605 01/11/18 0455  WBC 5.7 6.9  --  5.6  NEUTROABS  --  5.7  --   --   HGB  5.5* 5.7* 7.7* 8.4*  HCT 22.3* 22.6* 27.7* 29.9*  MCV 63.7* 63.8*  --  67.0*  PLT 316 328  --  937   Basic Metabolic Panel: Recent Labs  Lab 01/10/18 0151 01/11/18 0455  NA 142 142  K 3.6 3.9  CL 110 110  CO2 24 24  GLUCOSE 105* 99  BUN 20 14  CREATININE 1.14 0.98  CALCIUM 9.1  9.3   GFR: Estimated Creatinine Clearance: 80.3 mL/min (by C-G formula based on SCr of 0.98 mg/dL). Liver Function Tests: No results for input(s): AST, ALT, ALKPHOS, BILITOT, PROT, ALBUMIN in the last 168 hours. No results for input(s): LIPASE, AMYLASE in the last 168 hours. No results for input(s): AMMONIA in the last 168 hours. Coagulation Profile: No results for input(s): INR, PROTIME in the last 168 hours. Cardiac Enzymes: No results for input(s): CKTOTAL, CKMB, CKMBINDEX, TROPONINI in the last 168 hours. BNP (last 3 results) No results for input(s): PROBNP in the last 8760 hours. HbA1C: No results for input(s): HGBA1C in the last 72 hours. CBG: No results for input(s): GLUCAP in the last 168 hours. Lipid Profile: No results for input(s): CHOL, HDL, LDLCALC, TRIG, CHOLHDL, LDLDIRECT in the last 72 hours. Thyroid Function Tests: Recent Labs    01/11/18 0747  TSH 1.510   Anemia Panel: Recent Labs    01/10/18 0151 01/11/18 0747  VITAMINB12  --  354  FERRITIN 1*  --   TIBC 517*  --   IRON 7*  --    Sepsis Labs: No results for input(s): PROCALCITON, LATICACIDVEN in the last 168 hours.  No results found for this or any previous visit (from the past 240 hour(s)).       Radiology Studies: Ct Head Wo Contrast  Result Date: 01/10/2018 CLINICAL DATA:  Patient became unresponsive and vomited. Lethargy. Left-sided leg weakness. EXAM: CT HEAD WITHOUT CONTRAST TECHNIQUE: Contiguous axial images were obtained from the base of the skull through the vertex without intravenous contrast. COMPARISON:  10/26/2017 head CT FINDINGS: Brain: Patchy chronic appearing small vessel ischemic disease. Chronic pontine and left basal ganglial lacunar infarcts. No large vascular territory infarct, hemorrhage or midline shift. No intra-axial mass nor extra-axial fluid collections. Superficial atrophy is noted as before. Vascular: No hyperdense vessel sign. Skull: Intact bony calvarium.  No soft  tissue swelling of the scalp. Sinuses/Orbits: Clear paranasal sinuses.  Intact orbits and globes. Other: None IMPRESSION: Atrophy with chronic small vessel ischemia. No acute intracranial abnormality. Electronically Signed   By: Ashley Royalty M.D.   On: 01/10/2018 03:39        Scheduled Meds: . amLODipine  10 mg Oral Daily  . [START ON 01/12/2018] cloNIDine  0.1 mg Oral Daily  . DULoxetine  30 mg Oral BID  . ferrous sulfate  325 mg Oral TID WC  . hydrochlorothiazide  12.5 mg Oral Daily  . lisinopril  20 mg Oral Daily  . pantoprazole (PROTONIX) IV  40 mg Intravenous Q12H   Continuous Infusions: . sodium chloride       LOS: 1 day    I have spent 35 minutes face to face with the patient and on the ward discussing the patients care, assessment, plan and disposition with other care givers. >50% of the time was devoted counseling the patient about the risks and benefits of treatment and coordinating care.     Rahsaan Weakland Arsenio Loader, MD [facility] Pager [phone]   If 7PM-7AM, please contact night-coverage [email or web address] Password [facility] 01/11/2018, 12:41 PM

## 2018-01-11 NOTE — Progress Notes (Addendum)
1 hour after BP medications given BP 184/77 and patient now c/o of HA 9/10. Paged K. Schorr. She said to give 10am scheduled norvasc early. Continue to monitor.

## 2018-01-11 NOTE — Interval H&P Note (Signed)
History and Physical Interval Note:  01/11/2018 3:10 PM  [name]  has presented today for surgery, with the diagnosis of Anemia  The various methods of treatment have been discussed with the patient and family. After consideration of risks, benefits and other options for treatment, the patient has consented to  Procedure(s): ESOPHAGOGASTRODUODENOSCOPY (EGD) WITH PROPOFOL (N/A) as a surgical intervention .  The patient's history has been reviewed, patient examined, no change in status, stable for surgery.  I have reviewed the patient's chart and labs.  Questions were answered to the patient's satisfaction.     Silvano Rusk

## 2018-01-12 ENCOUNTER — Telehealth: Payer: Self-pay

## 2018-01-12 ENCOUNTER — Inpatient Hospital Stay (HOSPITAL_COMMUNITY): Payer: Medicare Other

## 2018-01-12 LAB — BASIC METABOLIC PANEL
Anion gap: 10 (ref 5–15)
BUN: 15 mg/dL (ref 8–23)
CO2: 22 mmol/L (ref 22–32)
Calcium: 9 mg/dL (ref 8.9–10.3)
Chloride: 105 mmol/L (ref 98–111)
Creatinine, Ser: 1.14 mg/dL (ref 0.61–1.24)
GFR calc Af Amer: >60 mL/min (ref >60)
GFR calc non Af Amer: >60 mL/min (ref >60)
Glucose, Bld: 82 mg/dL (ref 70–99)
Potassium: 3.6 mmol/L (ref 3.5–5.1)
Sodium: 137 mmol/L (ref 135–145)

## 2018-01-12 LAB — CBC
HCT: 29.5 % — ABNORMAL LOW (ref 39.0–52.0)
Hemoglobin: 8.3 g/dL — ABNORMAL LOW (ref 13.0–17.0)
MCH: 19 pg — ABNORMAL LOW (ref 26.0–34.0)
MCHC: 28.1 g/dL — ABNORMAL LOW (ref 30.0–36.0)
MCV: 67.5 fL — ABNORMAL LOW (ref 78.0–100.0)
Platelets: 330 10*3/uL (ref 150–400)
RBC: 4.37 MIL/uL (ref 4.22–5.81)
RDW: 23.6 % — ABNORMAL HIGH (ref 11.5–15.5)
WBC: 6.1 10*3/uL (ref 4.0–10.5)

## 2018-01-12 LAB — FOLATE RBC
Folate, Hemolysate: 463.3 ng/mL
Folate, RBC: 1443 ng/mL (ref >498)
Hematocrit: 32.1 % — ABNORMAL LOW (ref 37.5–51.0)

## 2018-01-12 LAB — MAGNESIUM: Magnesium: 2.2 mg/dL (ref 1.7–2.4)

## 2018-01-12 MED ORDER — ADULT MULTIVITAMIN W/MINERALS CH
1.0000 | ORAL_TABLET | Freq: Every day | ORAL | Status: DC
  Administered 2018-01-12 – 2018-01-13 (×2): 1 via ORAL
  Filled 2018-01-12 (×2): qty 1

## 2018-01-12 MED ORDER — LISINOPRIL 20 MG PO TABS
40.0000 mg | ORAL_TABLET | Freq: Every day | ORAL | Status: DC
  Administered 2018-01-12 – 2018-01-13 (×2): 40 mg via ORAL
  Filled 2018-01-12 (×2): qty 2

## 2018-01-12 MED ORDER — SUCRALFATE 1 GM/10ML PO SUSP
1.0000 g | Freq: Three times a day (TID) | ORAL | Status: DC
  Administered 2018-01-12 – 2018-01-13 (×4): 1 g via ORAL
  Filled 2018-01-12 (×4): qty 10

## 2018-01-12 MED ORDER — KETOROLAC TROMETHAMINE 30 MG/ML IJ SOLN
30.0000 mg | Freq: Once | INTRAMUSCULAR | Status: AC
  Administered 2018-01-12: 30 mg via INTRAVENOUS
  Filled 2018-01-12: qty 1

## 2018-01-12 MED ORDER — CLONIDINE HCL 0.1 MG PO TABS: 0.1000 mg | ORAL_TABLET | Freq: Every day | ORAL | Status: DC

## 2018-01-12 MED ORDER — ENSURE ENLIVE PO LIQD
237.0000 mL | ORAL | Status: DC
  Administered 2018-01-12: 237 mL via ORAL

## 2018-01-12 MED ORDER — HYDROCODONE-ACETAMINOPHEN 5-325 MG PO TABS
1.0000 | ORAL_TABLET | Freq: Four times a day (QID) | ORAL | Status: DC | PRN
  Administered 2018-01-12 (×2): 1 via ORAL
  Filled 2018-01-12 (×2): qty 1

## 2018-01-12 NOTE — Progress Notes (Signed)
PROGRESS NOTE    [name]  EHU:[id number] DOB: [birth year] DOA: 01/10/2018 PCP: Billie Ruddy, MD   Brief Narrative:  67 year old male with history of iron deficiency anemia, previous stroke with residual left-sided weakness, essential hypertension, depression came to the hospital with complaints of a syncopal episode and symptomatic anemia.  Upon admission he was found to have hemoglobin of 5.5 with borderline systolic low blood pressure of 88 which improved with IV fluids.  CT of the head was negative.  He was transfused 2 units And gastroenterology was consulted.  Patient underwent endoscopy on 6/26 which showed grade B esophagitis, hiatal hernia and nonbleeding gastropathy.  Gastroenterology recommended upper GI series.   Assessment & Plan:   Principal Problem:   Symptomatic anemia Active Problems:   HTN (hypertension)   Anemia, iron deficiency   Hiatal hernia with gastroesophageal reflux disease and esophagitis   Syncope   Cameron ulcer, chronic   Microcytic anemia  Syncope secondary to symptomatic anemia Iron deficiency anemia Grade B esophagitis - Received 1 dose of IV iron.  Currently on oral supplements.  Bowel regimen as needed. - N.p.o. this morning for upper GI series -Endoscopy 6/26 showed grade B esophagitis, hiatal hernia, nonbleeding gastropathy - Appreciate GI input -Closely monitor hemoglobin -Status post 2 units of PRBC transfusion - TSH, B12 within normal limits, folate-pending  Essential hypertension -We will resume his home medications  History of depression -Stable, continue Cymbalta  DVT prophylaxis: SCDs Code Status: Full code Family Communication: Fianc bedside Disposition Plan: Maintain inpatient stay until cleared by GI  Consultants:   Gastroenterology  Procedures:   None  Antimicrobials:   None   Subjective: No acute events overnight. No complaints at this time.   Review of Systems Otherwise negative except as per  HPI, including: General = no fevers, chills, dizziness, malaise, fatigue HEENT/EYES = negative for pain, redness, loss of vision, double vision, blurred vision, loss of hearing, sore throat, hoarseness, dysphagia Cardiovascular= negative for chest pain, palpitation, murmurs, lower extremity swelling Respiratory/lungs= negative for shortness of breath, cough, hemoptysis, wheezing, mucus production Gastrointestinal= negative for nausea, vomiting,, abdominal pain, melena, hematemesis Genitourinary= negative for Dysuria, Hematuria, Change in Urinary Frequency MSK = Negative for arthralgia, myalgias, Back Pain, Joint swelling  Neurology= Negative for headache, seizures, numbness, tingling  Psychiatry= Negative for anxiety, depression, suicidal and homocidal ideation Allergy/Immunology= Medication/Food allergy as listed  Skin= Negative for Rash, lesions, ulcers, itching   Objective: Vitals:   01/11/18 1837 01/11/18 2009 01/11/18 2206 01/12/18 0622  BP: (!) 151/71 (!) 156/84 (!) 162/79 (!) 171/85  Pulse: 61 71 (!) 58 (!) 59  Resp:  20 20 14   Temp:  98.6 F (37 C) 98.5 F (36.9 C) 98.7 F (37.1 C)  TempSrc:  Oral Oral Oral  SpO2:  98% 100% 100%  Weight:      Height:        Intake/Output Summary (Last 24 hours) at 01/12/2018 1058 Last data filed at 01/12/2018 0600 Gross per 24 hour  Intake 240 ml  Output 0 ml  Net 240 ml   Filed Weights   01/10/18 0147 01/10/18 0602  Weight: 104.3 kg (230 lb) 90.6 kg (199 lb 11.8 oz)    Examination: Constitutional: NAD, calm, comfortable Eyes: PERRL, lids and conjunctivae normal ENMT: Mucous membranes are moist. Posterior pharynx clear of any exudate or lesions.Normal dentition.  Neck: normal, supple, no masses, no thyromegaly Respiratory: clear to auscultation bilaterally, no wheezing, no crackles. Normal respiratory effort. No accessory muscle use.  Cardiovascular: Regular rate and rhythm, no murmurs / rubs / gallops. No extremity edema. 2+  pedal pulses. No carotid bruits.  Abdomen: no tenderness, no masses palpated. No hepatosplenomegaly. Bowel sounds positive.  Musculoskeletal: no clubbing / cyanosis. No joint deformity upper and lower extremities. Good ROM, no contractures. Normal muscle tone.  Skin: no rashes, lesions, ulcers. No induration Neurologic: CN 2-12 grossly intact. Sensation intact, DTR normal. Strength 5/5 in all 4.  Psychiatric: Normal judgment and insight. Alert and oriented x 3. Normal mood.    Data Reviewed:   CBC: Recent Labs  Lab 01/10/18 0151 01/10/18 0433 01/10/18 1605 01/11/18 0455 01/12/18 0410  WBC 5.7 6.9  --  5.6 6.1  NEUTROABS  --  5.7  --   --   --   HGB 5.5* 5.7* 7.7* 8.4* 8.3*  HCT 22.3* 22.6* 27.7* 29.9* 29.5*  MCV 63.7* 63.8*  --  67.0* 67.5*  PLT 316 328  --  294 992   Basic Metabolic Panel: Recent Labs  Lab 01/10/18 0151 01/11/18 0455 01/12/18 0410  NA 142 142 137  K 3.6 3.9 3.6  CL 110 110 105  CO2 24 24 22   GLUCOSE 105* 99 82  BUN 20 14 15   CREATININE 1.14 0.98 1.14  CALCIUM 9.1 9.3 9.0  MG  --   --  2.2   GFR: Estimated Creatinine Clearance: 69 mL/min (by C-G formula based on SCr of 1.14 mg/dL). Liver Function Tests: Recent Labs  Lab 01/11/18 1716  AST 13*  ALT 9  ALKPHOS 92  BILITOT 1.2  PROT 7.3  ALBUMIN 4.1   No results for input(s): LIPASE, AMYLASE in the last 168 hours. No results for input(s): AMMONIA in the last 168 hours. Coagulation Profile: No results for input(s): INR, PROTIME in the last 168 hours. Cardiac Enzymes: No results for input(s): CKTOTAL, CKMB, CKMBINDEX, TROPONINI in the last 168 hours. BNP (last 3 results) No results for input(s): PROBNP in the last 8760 hours. HbA1C: No results for input(s): HGBA1C in the last 72 hours. CBG: Recent Labs  Lab 01/10/18 0244  GLUCAP 113*   Lipid Profile: No results for input(s): CHOL, HDL, LDLCALC, TRIG, CHOLHDL, LDLDIRECT in the last 72 hours. Thyroid Function Tests: Recent Labs     01/11/18 0747  TSH 1.510   Anemia Panel: Recent Labs    01/10/18 0151 01/11/18 0747  VITAMINB12  --  354  FERRITIN 1*  --   TIBC 517*  --   IRON 7*  --    Sepsis Labs: No results for input(s): PROCALCITON, LATICACIDVEN in the last 168 hours.  No results found for this or any previous visit (from the past 240 hour(s)).       Radiology Studies: No results found.      Scheduled Meds: . amLODipine  10 mg Oral Daily  . cloNIDine  0.1 mg Oral Daily  . DULoxetine  30 mg Oral BID  . ferrous sulfate  325 mg Oral TID WC  . hydrochlorothiazide  12.5 mg Oral Daily  . lisinopril  40 mg Oral Daily  . pantoprazole (PROTONIX) IV  40 mg Intravenous Q12H  . sucralfate  1 g Oral TID WC & HS   Continuous Infusions:    LOS: 2 days    I have spent 30 minutes face to face with the patient and on the ward discussing the patients care, assessment, plan and disposition with other care givers. >50% of the time was devoted counseling the patient about the risks  and benefits of treatment and coordinating care.     Gerrald Basu Arsenio Loader, MD [facility] Pager [phone]   If 7PM-7AM, please contact night-coverage [email or web address] Password [facility] 01/12/2018, 10:58 AM

## 2018-01-12 NOTE — Telephone Encounter (Signed)
The pt has been scheduled for appt with Dr Loletha Carrow on 02/20/18 at 830 am.  The pt will be notified at discharge.

## 2018-01-12 NOTE — Telephone Encounter (Signed)
-----   Message from Levin Erp, [place] sent at 01/12/2018  2:42 PM EDT ----- Regarding: Appt Patient needs follow up appt in a mos with Dr. Loletha Carrow for anemia/hiatal hernia, if available-if not then me. Currently in hospital, will need to be notified of appt. Thanks-JLL

## 2018-01-12 NOTE — Progress Notes (Signed)
Initial Nutrition Assessment  DOCUMENTATION CODES:   Not applicable  INTERVENTION:  - Will order Ensure Enlive BID, each supplement provides 350 kcal and 20 grams of protein - Will order daily multivitamin with minerals. - Diet re-advancement as medically feasible. - Continue to encourage PO intakes.    NUTRITION DIAGNOSIS:   Inadequate oral intake related to early satiety as evidenced by per patient/family report.  GOAL:   Patient will meet greater than or equal to 90% of their needs  MONITOR:   Diet advancement, PO intake, Supplement acceptance, Weight trends, Labs  REASON FOR ASSESSMENT:   Consult Assessment of nutrition requirement/status  ASSESSMENT:   67 year old male with history of iron deficiency anemia, previous stroke with residual left-sided weakness, essential hypertension, and depression. He came to the ED with complaints of a syncopal episode and symptomatic anemia. Upon admission he was found to have hemoglobin of 5.5 with borderline systolic low blood pressure of 88 which improved with IV fluids. CT of the head was negative. Patient underwent endoscopy on 6/26 which showed grade B esophagitis, hiatal hernia, and nonbleeding gastropathy. Gastroenterology recommended upper GI series.  BMI indicates overweight status. Patient is currently NPO pending upper GI. He was on Heart Healthy diet yesterday and consumed 100% of breakfast. Patient found to have grade B esophagitis and hiatal hernia during EGD yesterday. He denies any throat pain before or since EGD, even when swallowing foods. He states that he has had acid reflux x2 years and had a hiatal hernia ~2 years ago. He has not been on medication for reflux until recently and has noticed tremendous relief.   He denies nausea but states his abdomen feels "so-so" but does not confirm any feelings of abdominal pain or pressure. He states that he usually has a very good appetite, but for the past few weeks to few months  he has been getting full quickly and unable to eat as much as he previously was. He states that he has lost a great deal of weight in unknown time frame. Patient was unable to make the connection between decreased intakes and weight loss.   NFPE outlined below. Per chart review, he has lost 13 lbs (6% body weight) in the past 2.5-3 months. This is not significant for time frame.    Medications reviewed; 325 mg ferrous sulfate TID, 510 mg IV Feraheme x1 dose yesterday, 40 mg IV Protonix BID, 1 g Carafate TID.  Labs reviewed.      NUTRITION - FOCUSED PHYSICAL EXAM:  Completed; no muscle and no fat wasting, no edema noted at this time.   Diet Order:   Diet Order           Diet NPO time specified Except for: Sips with Meds  Diet effective midnight          EDUCATION NEEDS:   No education needs have been identified at this time  Skin:  Skin Assessment: Reviewed RN Assessment  Last BM:  6/24  Height:   Ht Readings from Last 1 Encounters:  01/10/18 5\' 9"  (1.753 m)    Weight:   Wt Readings from Last 1 Encounters:  01/10/18 199 lb 11.8 oz (90.6 kg)    Ideal Body Weight:  72.73 kg  BMI:  Body mass index is 29.5 kg/m.  Estimated Nutritional Needs:   Kcal:  1995-2175 (22-24 kcal/kg)  Protein:  90-105 grams  Fluid:  >/= 1.8 L/day    Jarome Matin, MS, RD, LDN, [facility] Inpatient Clinical Dietitian Pager # [phone]  After hours/weekend pager # [phone]

## 2018-01-12 NOTE — Progress Notes (Addendum)
Progress Note   Assessment / Plan:   Assessment: 1.  Symptomatic iron deficiency anemia: Hemoglobin stable at 8.3 today, thought related to [facility] and erosive gastropathy 2.  Syncope 3.  Heartburn: Better today per the patient, hiatal hernia likely contributing  Plan: 1.  Continue twice daily PPI 2.  Upper GI series has been ordered today to evaluate hiatal hernia-consider repair outpatient 3.  Added Carafate 4 times daily-continue for one mos (tablets not suspension at dc) 4.  Continue to monitor hemoglobin with transfusion as needed less than 7 5. Will set patient up for follow up in our office in 4 weeks. Patient will be notified of appt time. 5.  Please await final recommendations from Dr. Carlean Purl later today.    LOS: 2 days   Lavone Nian [facility]  01/12/2018, 10:08 AM  Pager # [phone]    [facility] Attending   I have taken an interval history, reviewed the chart and examined the patient. I agree with the Advanced Practitioner's note, impression and recommendations.   Large HH - no paraesophageal component  Will arrange outpatient f/u w/ [place]  He could benefit from hiatal hernia surgery but that would be elective and up to him and other MD's (not small surgery)  Signing off  Stay on meds as above No anticoag issues but if he needs a baby asa that is fine  Gatha Mayer, MD, [facility] 01/12/2018 3:38 PM    Subjective  Chief Complaint: Symptomatic anemia  This morning patient found laying in bed comfortably, explains that he does feel slightly better than when he came in.  He is hungry at the moment as he has been n.p.o. after midnight.  He asked when the imaging is going to be done so that he can eat.   Objective   Vital signs in last 24 hours: Temp:  [97.9 F (36.6 C)-98.7 F (37.1 C)] 98.7 F (37.1 C) (06/27 0622) Pulse Rate:  [58-88] 59 (06/27 0622) Resp:  [13-36] 14 (06/27 0622) BP: (120-197)/(68-100) 171/85 (06/27  0622) SpO2:  [95 %-100 %] 100 % (06/27 0622) Last BM Date: 01/09/18 General:   AA male in NAD Heart:  Regular rate and rhythm; no murmurs Lungs: Respirations even and unlabored, lungs CTA bilaterally Abdomen:  Soft, nontender and nondistended. Normal bowel sounds. Extremities:  Without edema. Neurologic:  Alert and oriented,  grossly normal neurologically. Psych:  Cooperative. Normal mood and affect.   Lab Results: Recent Labs    01/10/18 0433 01/10/18 1605 01/11/18 0455 01/12/18 0410  WBC 6.9  --  5.6 6.1  HGB 5.7* 7.7* 8.4* 8.3*  HCT 22.6* 27.7* 29.9* 29.5*  PLT 328  --  294 330   BMET Recent Labs    01/10/18 0151 01/11/18 0455 01/12/18 0410  NA 142 142 137  K 3.6 3.9 3.6  CL 110 110 105  CO2 24 24 22   GLUCOSE 105* 99 82  BUN 20 14 15   CREATININE 1.14 0.98 1.14  CALCIUM 9.1 9.3 9.0    EGD 01/11/2018 Dr. Carlean Purl: Impression:       - LA Grade B reflux esophagitis.                           - 10 cm hiatal hernia.                           - Gastritis. Biopsied.                           -  Non-bleeding erosive gastropathy.                           - The examination was otherwise normal. Recommendation:                                     - Return patient to hospital ward for ongoing care.                           - UGI series tomorrow to assess hiatal hernia more                           Should be considered for a repair though elective                            not while here                           Stay on bid PPI                           feraheme x 1                           reflux precautions                           Carafate

## 2018-01-13 LAB — CBC
HCT: 29.6 % — ABNORMAL LOW (ref 39.0–52.0)
Hemoglobin: 8.2 g/dL — ABNORMAL LOW (ref 13.0–17.0)
MCH: 19.1 pg — ABNORMAL LOW (ref 26.0–34.0)
MCHC: 27.7 g/dL — ABNORMAL LOW (ref 30.0–36.0)
MCV: 68.8 fL — ABNORMAL LOW (ref 78.0–100.0)
Platelets: 286 10*3/uL (ref 150–400)
RBC: 4.3 MIL/uL (ref 4.22–5.81)
RDW: 23.7 % — ABNORMAL HIGH (ref 11.5–15.5)
WBC: 5.3 10*3/uL (ref 4.0–10.5)

## 2018-01-13 LAB — BASIC METABOLIC PANEL
Anion gap: 8 (ref 5–15)
BUN: 19 mg/dL (ref 8–23)
CO2: 26 mmol/L (ref 22–32)
Calcium: 9.1 mg/dL (ref 8.9–10.3)
Chloride: 105 mmol/L (ref 98–111)
Creatinine, Ser: 0.97 mg/dL (ref 0.61–1.24)
GFR calc Af Amer: >60 mL/min (ref >60)
GFR calc non Af Amer: >60 mL/min (ref >60)
Glucose, Bld: 92 mg/dL (ref 70–99)
Potassium: 4.2 mmol/L (ref 3.5–5.1)
Sodium: 139 mmol/L (ref 135–145)

## 2018-01-13 LAB — MAGNESIUM: Magnesium: 2.4 mg/dL (ref 1.7–2.4)

## 2018-01-13 MED ORDER — PANTOPRAZOLE SODIUM 40 MG PO TBEC
40.0000 mg | DELAYED_RELEASE_TABLET | Freq: Two times a day (BID) | ORAL | Status: DC
  Administered 2018-01-13: 40 mg via ORAL
  Filled 2018-01-13: qty 1

## 2018-01-13 MED ORDER — FERROUS SULFATE 325 (65 FE) MG PO TABS: 325.0000 mg | ORAL_TABLET | Freq: Three times a day (TID) | ORAL | 0 refills | Status: DC

## 2018-01-13 MED ORDER — SUCRALFATE 1 GM/10ML PO SUSP: 1.0000 g | Freq: Three times a day (TID) | ORAL | 0 refills | Status: DC

## 2018-01-13 MED ORDER — CLONIDINE HCL 0.1 MG PO TABS: 0.1000 mg | ORAL_TABLET | Freq: Two times a day (BID) | ORAL | 11 refills | Status: DC

## 2018-01-13 MED ORDER — BISACODYL 5 MG PO TBEC: 5.0000 mg | DELAYED_RELEASE_TABLET | Freq: Every day | ORAL | 0 refills | Status: DC | PRN

## 2018-01-13 MED ORDER — PANTOPRAZOLE SODIUM 40 MG PO TBEC: 40.0000 mg | DELAYED_RELEASE_TABLET | Freq: Two times a day (BID) | ORAL | 0 refills | Status: DC

## 2018-01-13 MED ORDER — POLYETHYLENE GLYCOL 3350 17 G PO PACK: 17.0000 g | PACK | Freq: Every day | ORAL | 0 refills | Status: DC | PRN

## 2018-01-13 NOTE — Discharge Summary (Signed)
Physician Discharge Summary  [name] ZDG:[id number] DOB: [birth year] DOA: 01/10/2018  PCP: Billie Ruddy, MD  Admit date: 01/10/2018 Discharge date: 01/13/2018  Admitted From: Home Disposition: Home  Recommendations for Outpatient Follow-up:  1. Follow up with PCP in 1-2 weeks 2. Please obtain BMP/CBC in one week your next doctors visit.  3. Outpatient gastroenterology follow-up with [place] in 3-4 weeks.  Arrangements to be made by  the GI team 4. Take oral iron tablets as prescribed along with bowel regimen as needed for constipation 5. Take Protonix twice daily before meals and Carafate. 6.  clonidine has been changed to be taken twice daily   Home Health: None Equipment/Devices: None Discharge Condition: Stable CODE STATUS: Full code Diet recommendation: 2 g salt diet  Brief/Interim Summary: 67 year old male with history of iron deficiency anemia, previous stroke with residual left-sided weakness, essential hypertension, depression came to the hospital with complaints of a syncopal episode and symptomatic anemia.  Upon admission he was found to have hemoglobin of 5.5 with borderline systolic low blood pressure of 88 which improved with IV fluids.  CT of the head was negative.  He was transfused 2 units And gastroenterology was consulted.  Patient underwent endoscopy on 6/26 which showed grade B esophagitis, hiatal hernia and nonbleeding gastropathy.  Gastroenterology recommended upper GI series,  Which showed large hiatal hernia. Due to patient's iron deficiency anemia he was started on iron supplements after he was given a dose of 1 IV iron.  Placed on bowel regimen as needed for constipation.  Biopsy from gastric ulcer showed inflammatory changes.  He was started on Protonix twice daily to be taken before meals and Carafate.  Upper GI series showed large hiatal hernia for which he needs to follow-up outpatient and may be elective surgical repair.  At this time patient is  tolerating oral diet without any complaints.  He is reached maximum benefit from hospital stay and stable to be discharged with outpatient recommendations as stated above     Discharge Diagnoses:  Principal Problem:   Symptomatic anemia Active Problems:   HTN (hypertension)   Anemia, iron deficiency   Hiatal hernia with gastroesophageal reflux disease and esophagitis   Syncope   Cameron ulcer, chronic   Microcytic anemia   Syncope secondary to symptomatic anemia, resolved Iron deficiency anemia Grade B esophagitis - Received 1 dose of IV iron.  Currently on oral supplements.  Bowel regimen as needed. - Upper GI series shows large hiatal hernia -Endoscopy 6/26 showed grade B esophagitis, hiatal hernia, nonbleeding gastropathy - Appreciate GI input -Closely monitor hemoglobin- remains stable.  -Status post 2 units of PRBC transfusion - TSH, B12 within normal limits, folate-WNL  Large hiatal hernia - GI recommends outpatient follow-up for possible elective surgical repair.  No further intervention planned inpatient for this.  Essential hypertension -Resume home medications, clonidine has been changed to be taken twice daily  History of depression -Stable, continue Cymbalta  DVT prophylaxis: SCDs Code Status: Full code Family Communication:  None at bedside Disposition Plan:  Discharge today    Discharge Instructions   Allergies as of 01/13/2018      Reactions   Penicillins Anaphylaxis   Has patient had a PCN reaction causing immediate rash, facial/tongue/throat swelling, SOB or lightheadedness with hypotension: Yes Has patient had a PCN reaction causing severe rash involving mucus membranes or skin necrosis: No Has patient had a PCN reaction that required hospitalization: No Has patient had a PCN reaction occurring within the last 10  years: No If all of the above answers are "NO", then may proceed with Cephalosporin use.[name] [pseudoephedrine] Other (See  Comments)   Heart "races"   Trazodone And Nefazodone Swelling      Medication List    STOP taking these medications   omeprazole 20 MG capsule Commonly known as:  PRILOSEC     TAKE these medications   acetaminophen 325 MG tablet Commonly known as:  TYLENOL Take 2 tablets (650 mg total) by mouth every 6 (six) hours as needed for mild pain or moderate pain.   amLODipine 10 MG tablet Commonly known as:  NORVASC Take 1 tablet (10 mg total) by mouth daily.   bisacodyl 5 MG EC tablet Commonly known as:  DULCOLAX Take 1 tablet (5 mg total) by mouth daily as needed for moderate constipation.   cloNIDine 0.1 MG tablet Commonly known as:  CATAPRES Take 1 tablet (0.1 mg total) by mouth 2 (two) times daily. What changed:  when to take this   Diclofenac Sodium 3 % Gel Place 1 application onto the skin 2 (two) times daily. To affected area.   DULoxetine 30 MG capsule Commonly known as:  CYMBALTA Take 1 capsule (30 mg total) by mouth 2 (two) times daily.   ferrous sulfate 325 (65 FE) MG tablet Take 1 tablet (325 mg total) by mouth 3 (three) times daily with meals.   hydrochlorothiazide 12.5 MG capsule Commonly known as:  MICROZIDE Take 1 capsule (12.5 mg total) by mouth daily.   HYDROcodone-acetaminophen 5-325 MG tablet Commonly known as:  NORCO/VICODIN Take 1 tablet by mouth every 6 (six) hours as needed for moderate pain.   hydrOXYzine 25 MG tablet Commonly known as:  ATARAX/VISTARIL Take 1-2 tablets (25-50 mg total) by mouth at bedtime as needed (sleep).   lisinopril 40 MG tablet Commonly known as:  PRINIVIL,ZESTRIL Take 1 tablet (40 mg total) by mouth daily.   pantoprazole 40 MG tablet Commonly known as:  PROTONIX Take 1 tablet (40 mg total) by mouth 2 (two) times daily before a meal.   polyethylene glycol packet Commonly known as:  MIRALAX / GLYCOLAX Take 17 g by mouth daily as needed for severe constipation.   sucralfate 1 GM/10ML suspension Commonly known as:   CARAFATE Take 10 mLs (1 g total) by mouth 4 (four) times daily -  with meals and at bedtime for 15 days.      Follow-up Information    Billie Ruddy, MD. Schedule an appointment as soon as possible for a visit in 1 week(s).   Specialty:  Family Medicine Contact information: [place] [address] [phone]        Gatha Mayer, MD. Schedule an appointment as soon as possible for a visit in 4 week(s).   Specialty:  Gastroenterology Contact information: [address] [place] [address] [phone]          Allergies  Allergen Reactions  . Penicillins Anaphylaxis    Has patient had a PCN reaction causing immediate rash, facial/tongue/throat swelling, SOB or lightheadedness with hypotension: Yes Has patient had a PCN reaction causing severe rash involving mucus membranes or skin necrosis: No Has patient had a PCN reaction that required hospitalization: No Has patient had a PCN reaction occurring within the last 10 years: No If all of the above answers are "NO", then may proceed with Cephalosporin use..  . Sudafed [Pseudoephedrine] Other (See Comments)    Heart "races"  . Trazodone And Nefazodone  Swelling    You were cared for by a hospitalist during your hospital stay. If you have any questions about your discharge medications or the care you received while you were in the hospital after you are discharged, you can call the unit and asked to speak with the hospitalist on call if the hospitalist that took care of you is not available. Once you are discharged, your primary care physician will handle any further medical issues. Please note that no refills for any discharge medications will be authorized once you are discharged, as it is imperative that you return to your primary care physician (or establish a relationship with a primary care physician if you do not have one) for your aftercare needs so that they can reassess your need for  medications and monitor your lab values.  Consultations:  Gastroenterology   Procedures/Studies: Ct Head Wo Contrast  Result Date: 01/10/2018 CLINICAL DATA:  Patient became unresponsive and vomited. Lethargy. Left-sided leg weakness. EXAM: CT HEAD WITHOUT CONTRAST TECHNIQUE: Contiguous axial images were obtained from the base of the skull through the vertex without intravenous contrast. COMPARISON:  10/26/2017 head CT FINDINGS: Brain: Patchy chronic appearing small vessel ischemic disease. Chronic pontine and left basal ganglial lacunar infarcts. No large vascular territory infarct, hemorrhage or midline shift. No intra-axial mass nor extra-axial fluid collections. Superficial atrophy is noted as before. Vascular: No hyperdense vessel sign. Skull: Intact bony calvarium.  No soft tissue swelling of the scalp. Sinuses/Orbits: Clear paranasal sinuses.  Intact orbits and globes. Other: None IMPRESSION: Atrophy with chronic small vessel ischemia. No acute intracranial abnormality. Electronically Signed   By: Ashley Royalty M.D.   On: 01/10/2018 03:39   Dg Ugi W/high Density W/kub  Result Date: 01/12/2018 CLINICAL DATA:  Evaluation of hiatal hernia EXAM: UPPER GI SERIES WITH KUB TECHNIQUE: After obtaining a scout radiograph a routine upper GI series was performed using thin and high density barium. FLUOROSCOPY TIME:  Fluoroscopy Time:  1 minutes 48 seconds Radiation Exposure Index (if provided by the fluoroscopic device): 35.9 mGy Number of Acquired Spot Images: 5 COMPARISON:  02/14/2017 abdominal CT FINDINGS: Scout image shows a normal bowel gas pattern. Oblique pharyngeal imaging is negative for aspiration, obstructive process, or diverticulum. The esophagus has normal distensibility and smooth mucosal contour. There is esophagitis by endoscopy that is not visualized radiographically. Normal motility. A 13 mm barium tablet easily reached the stomach. Moderate hiatal hernia with approximately 1/3 of the  stomach herniated. Sliding type morphology. Truncated gastric filming to limit radiation in this patient with mucosal exam yesterday. Normal fold pattern and distensibility. Normal fold pattern and course of the duodenum. IMPRESSION: Moderate sliding hiatal hernia involving a third of the stomach. Electronically Signed   By: Monte Fantasia M.D.   On: 01/12/2018 13:45     Subjective: No complaints.  Patient wishes to be discharged.  General = no fevers, chills, dizziness, malaise, fatigue HEENT/EYES = negative for pain, redness, loss of vision, double vision, blurred vision, loss of hearing, sore throat, hoarseness, dysphagia Cardiovascular= negative for chest pain, palpitation, murmurs, lower extremity swelling Respiratory/lungs= negative for shortness of breath, cough, hemoptysis, wheezing, mucus production Gastrointestinal= negative for nausea, vomiting,, abdominal pain, melena, hematemesis Genitourinary= negative for Dysuria, Hematuria, Change in Urinary Frequency MSK = Negative for arthralgia, myalgias, Back Pain, Joint swelling  Neurology= Negative for headache, seizures, numbness, tingling  Psychiatry= Negative for anxiety, depression, suicidal and homocidal ideation Allergy/Immunology= Medication/Food allergy as listed  Skin= Negative for Rash, lesions, ulcers, itching  Discharge Exam: Vitals:   01/12/18 2056 01/13/18 0507  BP: (!) 155/79 (!) 180/75  Pulse: 67 (!) 56  Resp: 18 18  Temp: 97.6 F (36.4 C) 98.8 F (37.1 C)  SpO2: 99% 97%   Vitals:   01/12/18 0622 01/12/18 1501 01/12/18 2056 01/13/18 0507  BP: (!) 171/85 140/77 (!) 155/79 (!) 180/75  Pulse: (!) 59 (!) 56 67 (!) 56  Resp: 14 20 18 18   Temp: 98.7 F (37.1 C) 98.2 F (36.8 C) 97.6 F (36.4 C) 98.8 F (37.1 C)  TempSrc: Oral Oral Oral Oral  SpO2: 100% 100% 99% 97%  Weight:      Height:        General: Pt is alert, awake, not in acute distress Cardiovascular: RRR, S1/S2 +, no rubs, no  gallops Respiratory: CTA bilaterally, no wheezing, no rhonchi Abdominal: Soft, NT, ND, bowel sounds + Extremities: no edema, no cyanosis    The results of significant diagnostics from this hospitalization (including imaging, microbiology, ancillary and laboratory) are listed below for reference.     Microbiology: No results found for this or any previous visit (from the past 240 hour(s)).   Labs: BNP (last 3 results) No results for input(s): BNP in the last 8760 hours. Basic Metabolic Panel: Recent Labs  Lab 01/10/18 0151 01/11/18 0455 01/12/18 0410 01/13/18 0357  NA 142 142 137 139  K 3.6 3.9 3.6 4.2  CL 110 110 105 105  CO2 24 24 22 26   GLUCOSE 105* 99 82 92  BUN 20 14 15 19   CREATININE 1.14 0.98 1.14 0.97  CALCIUM 9.1 9.3 9.0 9.1  MG  --   --  2.2 2.4   Liver Function Tests: Recent Labs  Lab 01/11/18 1716  AST 13*  ALT 9  ALKPHOS 92  BILITOT 1.2  PROT 7.3  ALBUMIN 4.1   No results for input(s): LIPASE, AMYLASE in the last 168 hours. No results for input(s): AMMONIA in the last 168 hours. CBC: Recent Labs  Lab 01/10/18 0151 01/10/18 0433 01/10/18 1605 01/11/18 0455 01/11/18 0747 01/12/18 0410 01/13/18 0357  WBC 5.7 6.9  --  5.6  --  6.1 5.3  NEUTROABS  --  5.7  --   --   --   --   --   HGB 5.5* 5.7* 7.7* 8.4*  --  8.3* 8.2*  HCT 22.3* 22.6* 27.7* 29.9* 32.1* 29.5* 29.6*  MCV 63.7* 63.8*  --  67.0*  --  67.5* 68.8*  PLT 316 328  --  294  --  330 286   Cardiac Enzymes: No results for input(s): CKTOTAL, CKMB, CKMBINDEX, TROPONINI in the last 168 hours. BNP: Invalid input(s): POCBNP CBG: Recent Labs  Lab 01/10/18 0244  GLUCAP 113*   D-Dimer No results for input(s): DDIMER in the last 72 hours. Hgb A1c No results for input(s): HGBA1C in the last 72 hours. Lipid Profile No results for input(s): CHOL, HDL, LDLCALC, TRIG, CHOLHDL, LDLDIRECT in the last 72 hours. Thyroid function studies Recent Labs    01/11/18 0747  TSH 1.510   Anemia  work up Recent Labs    01/11/18 0747  VITAMINB12 354   Urinalysis    Component Value Date/Time   COLORURINE YELLOW 09/10/2016 1730   APPEARANCEUR HAZY (A) 09/10/2016 1730   LABSPEC 1.023 09/10/2016 1730   PHURINE 5.0 09/10/2016 1730   GLUCOSEU NEGATIVE 09/10/2016 1730   HGBUR NEGATIVE 09/10/2016 [place] 09/10/2016 1730   BILIRUBINUR n 02/13/2016 1044  KETONESUR NEGATIVE 09/10/2016 1730   PROTEINUR 30 (A) 09/10/2016 1730   UROBILINOGEN 1.0 02/13/2016 1044   UROBILINOGEN 1.0 01/27/2012 0005   NITRITE NEGATIVE 09/10/2016 1730   LEUKOCYTESUR NEGATIVE 09/10/2016 1730   Sepsis Labs Invalid input(s): PROCALCITONIN,  WBC,  LACTICIDVEN Microbiology No results found for this or any previous visit (from the past 240 hour(s)).   Time coordinating discharge:  I have spent 35 minutes face to face with the patient and on the ward discussing the patients care, assessment, plan and disposition with other care givers. >50% of the time was devoted counseling the patient about the risks and benefits of treatment/Discharge disposition and coordinating care.   SIGNED:   Damita Lack, MD  [facility] 01/13/2018, 10:22 AM Pager   If 7PM-7AM, please contact night-coverage [email or web address] Password [email or web address]

## 2018-01-13 NOTE — Care Management Important Message (Signed)
Important Message  Patient Details  Name: [name] MRN: [id number] Date of Birth: [birth year]   Medicare Important Message Given:  Yes    Kerin Salen 01/13/2018, 10:06 AMImportant Message  Patient Details  Name: [name] MRN: [id number] Date of Birth: 1951/07/05   Medicare Important Message Given:  Yes    Kerin Salen 01/13/2018, 10:06 AM

## 2018-01-13 NOTE — Progress Notes (Signed)
I agree with the assessment of the previous nurse. 

## 2018-01-13 NOTE — Progress Notes (Signed)
Bxs of gastric cardia ulcer are inflammation Will review at hospital He is on PPI

## 2018-01-13 NOTE — Care Management Note (Signed)
Pt discharging home with no needs.  

## 2018-01-16 ENCOUNTER — Telehealth: Payer: Self-pay | Admitting: Family Medicine

## 2018-01-16 NOTE — Telephone Encounter (Signed)
LM for call for TCM/Hospitalization Follow-up

## 2018-01-17 NOTE — Telephone Encounter (Signed)
LM for call for TCM/Hospitalization Follow-up

## 2018-01-18 NOTE — Telephone Encounter (Signed)
PCP: Billie Ruddy, MD  Admit date: 01/10/2018 Discharge date: 01/13/2018  Admitted From: Home Disposition: Home  Recommendations for Outpatient Follow-up:  1. Follow up with PCP in 1-2 weeks 2. Please obtain BMP/CBC in one week your next doctors visit.  3. Outpatient gastroenterology follow-up with [facility] in 3-4 weeks.  Arrangements to be made by  the GI team 4. Take oral iron tablets as prescribed along with bowel regimen as needed for constipation 5. Take Protonix twice daily before meals and Carafate. 6.  clonidine has been changed to be taken twice daily   Home Health: None Equipment/Devices: None Discharge Condition: Stable CODE STATUS: Full code Diet recommendation: 2 g salt diet   Transition Care Management Follow-up Telephone Call   Date discharged? 01/13/18   How have you been since you were released from the hospital? "I am still weak but I am making it though." "I am having shoulder and hip pain from sciatic nerve problem, that's bothering me real bad but other than that I am trying to make it."   Do you understand why you were in the hospital? yes   Do you understand the discharge instructions? Pt is calling GI today to see about getting an appointment. Pt advised too when he gets an appointment with GI then they may be able to help with his medications until his Rx plan is restored.    Where were you discharged to? Home   Items Reviewed:  Medications reviewed: yes, Pt is not able get any medications refilled d/t his Rx Plan running out  Allergies reviewed: yes  Dietary changes reviewed: yes Referrals reviewed: yes, Pt is calling today to set up appt with GI  Functional Questionnaire:   Activities of Daily Living (ADLs):   He states they are independent in the following: n/a States they require assistance with the following: ambulation, bathing and hygiene, feeding, continence, grooming, toileting, dressing and patient has a friend with him  helping out   Any transportation issues/concerns?: yes, Pt does not have a form of transportation. Pt has never looked into [facility]. Pt uses the bus or walks to his appointments.    Any patient concerns? yes, he is not able to get any of his medications right now d/t not having Rx coverage. Pt aware that he can check other pharmacy's in the area to see what the out of pocket costs are.    Confirmed importance and date/time of follow-up visits scheduled yes  Provider Appointment booked with Dr Volanda Napoleon 01/25/18 at 10am  Confirmed with patient if condition begins to worsen call PCP or go to the ER.  Patient was given the office number and encouraged to call back with question or concerns.  : yes

## 2018-01-25 ENCOUNTER — Inpatient Hospital Stay: Payer: Medicare Other | Admitting: Family Medicine

## 2018-02-01 ENCOUNTER — Encounter: Payer: Self-pay | Admitting: Family Medicine

## 2018-02-01 ENCOUNTER — Ambulatory Visit (INDEPENDENT_AMBULATORY_CARE_PROVIDER_SITE_OTHER): Payer: Medicare Other | Admitting: Family Medicine

## 2018-02-01 VITALS — BP 110/60 | HR 54 | Temp 98.0°F | Wt 206.0 lb

## 2018-02-01 DIAGNOSIS — K449 Diaphragmatic hernia without obstruction or gangrene: Secondary | ICD-10-CM

## 2018-02-01 DIAGNOSIS — Z09 Encounter for follow-up examination after completed treatment for conditions other than malignant neoplasm: Secondary | ICD-10-CM

## 2018-02-01 DIAGNOSIS — F17210 Nicotine dependence, cigarettes, uncomplicated: Secondary | ICD-10-CM

## 2018-02-01 DIAGNOSIS — K210 Gastro-esophageal reflux disease with esophagitis: Secondary | ICD-10-CM | POA: Diagnosis not present

## 2018-02-01 DIAGNOSIS — B351 Tinea unguium: Secondary | ICD-10-CM | POA: Diagnosis not present

## 2018-02-01 DIAGNOSIS — I10 Essential (primary) hypertension: Secondary | ICD-10-CM | POA: Diagnosis not present

## 2018-02-01 DIAGNOSIS — D509 Iron deficiency anemia, unspecified: Secondary | ICD-10-CM | POA: Diagnosis not present

## 2018-02-01 LAB — CBC WITH DIFFERENTIAL/PLATELET
Basophils Absolute: 0.1 10*3/uL (ref 0.0–0.1)
Basophils Relative: 0.9 % (ref 0.0–3.0)
Eosinophils Absolute: 0.1 10*3/uL (ref 0.0–0.7)
Eosinophils Relative: 1.2 % (ref 0.0–5.0)
HCT: 32.3 % — ABNORMAL LOW (ref 39.0–52.0)
Hemoglobin: 9.9 g/dL — ABNORMAL LOW (ref 13.0–17.0)
Lymphocytes Relative: 30.6 % (ref 12.0–46.0)
Lymphs Abs: 1.8 10*3/uL (ref 0.7–4.0)
MCHC: 30.6 g/dL (ref 30.0–36.0)
MCV: 77.5 fl — ABNORMAL LOW (ref 78.0–100.0)
Monocytes Absolute: 0.4 10*3/uL (ref 0.1–1.0)
Monocytes Relative: 7 % (ref 3.0–12.0)
Neutro Abs: 3.6 10*3/uL (ref 1.4–7.7)
Neutrophils Relative %: 60.3 % (ref 43.0–77.0)
Platelets: 366 10*3/uL (ref 150.0–400.0)
RBC: 4.17 Mil/uL — ABNORMAL LOW (ref 4.22–5.81)
RDW: 36.9 % — ABNORMAL HIGH (ref 11.5–15.5)
WBC: 6 10*3/uL (ref 4.0–10.5)

## 2018-02-01 LAB — BASIC METABOLIC PANEL
BUN: 13 mg/dL (ref 6–23)
CO2: 27 mEq/L (ref 19–32)
Calcium: 9.2 mg/dL (ref 8.4–10.5)
Chloride: 104 mEq/L (ref 96–112)
Creatinine, Ser: 1.1 mg/dL (ref 0.40–1.50)
GFR: 85.39 mL/min (ref >60.00)
Glucose, Bld: 88 mg/dL (ref 70–99)
Potassium: 4.5 mEq/L (ref 3.5–5.1)
Sodium: 139 mEq/L (ref 135–145)

## 2018-02-01 NOTE — Patient Instructions (Addendum)
Fungal Nail Infection Fungal nail infection is a common fungal infection of the toenails or fingernails. This condition affects toenails more often than fingernails. More than one nail may be infected. The condition can be passed from person to person (is contagious). What are the causes? This condition is caused by a fungus. Several types of funguses can cause the infection. These funguses are common in moist and warm areas. If your hands or feet come into contact with the fungus, it may get into a crack in your fingernail or toenail and cause the infection. What increases the risk? The following factors may make you more likely to develop this condition:  Being male.  Having diabetes.  Being of older age.  Living with someone who has the fungus.  Walking barefoot in areas where the fungus thrives, such as showers or locker rooms.  Having poor circulation.  Wearing shoes and socks that cause your feet to sweat.  Having athlete's foot.  Having a nail injury or history of a recent nail surgery.  Having psoriasis.  Having a weak body defense system (immune system).  What are the signs or symptoms? Symptoms of this condition include:  A pale spot on the nail.  Thickening of the nail.  A nail that becomes yellow or brown.  A brittle or ragged nail edge.  A crumbling nail.  A nail that has lifted away from the nail bed.  How is this diagnosed? This condition is diagnosed with a physical exam. Your health care provider may take a scraping or clipping from your nail to test for the fungus. How is this treated? Mild infections do not need treatment. If you have significant nail changes, treatment may include:  Oral antifungal medicines. You may need to take the medicine for several weeks or several months, and you may not see the results for a long time. These medicines can cause side effects. Ask your health care provider what problems to watch for.  Antifungal nail polish  and nail cream. These may be used along with oral antifungal medicines.  Laser treatment of the nail.  Surgery to remove the nail. This may be needed for the most severe infections.  Treatment takes a long time, and the infection may come back. Follow these instructions at home: Medicines  Take or apply over-the-counter and prescription medicines only as told by your health care provider.  Ask your health care provider about using over-the-counter mentholated ointment on your nails. Lifestyle   Do not share personal items, such as towels or nail clippers.  Trim your nails often.  Wash and dry your hands and feet every day.  Wear absorbent socks, and change your socks frequently.  Wear shoes that allow air to circulate, such as sandals or canvas tennis shoes. Throw out old shoes.  Wear rubber gloves if you are working with your hands in wet areas.  Do not walk barefoot in shower rooms or locker rooms.  Do not use a nail salon that does not use clean instruments.  Do not use artificial nails. General instructions  Keep all follow-up visits as told by your health care provider. This is important.  Use antifungal foot powder on your feet and in your shoes. Contact a health care provider if: Your infection is not getting better or it is getting worse after several months. This information is not intended to replace advice given to you by your health care provider. Make sure you discuss any questions you have with your health   care provider. Document Released: 07/02/2000 Document Revised: 12/11/2015 Document Reviewed: 01/06/2015 Elsevier Interactive Patient Education  2018 [place].  Hiatal Hernia A hiatal hernia occurs when part of the stomach slides above the muscle that separates the abdomen from the chest (diaphragm). A person can be born with a hiatal hernia (congenital), or it may develop over time. In almost all cases of hiatal hernia, only the top part of the  stomach pushes through the diaphragm. Many people have a hiatal hernia with no symptoms. The larger the hernia, the more likely it is that you will have symptoms. In some cases, a hiatal hernia allows stomach acid to flow back into the tube that carries food from your mouth to your stomach (esophagus). This may cause heartburn symptoms. Severe heartburn symptoms may mean that you have developed a condition called gastroesophageal reflux disease (GERD). What are the causes? This condition is caused by a weakness in the opening (hiatus) where the esophagus passes through the diaphragm to attach to the upper part of the stomach. A person may be born with a weakness in the hiatus, or a weakness can develop over time. What increases the risk? This condition is more likely to develop in:  Older people. Age is a major risk factor for a hiatal hernia, especially if you are over the age of 47.  Pregnant women.  People who are overweight.  People who have frequent constipation.  What are the signs or symptoms? Symptoms of this condition usually develop in the form of GERD symptoms. Symptoms include:  Heartburn.  Belching.  Indigestion.  Trouble swallowing.  Coughing or wheezing.  Sore throat.  Hoarseness.  Chest pain.  Nausea and vomiting.  How is this diagnosed? This condition may be diagnosed during testing for GERD. Tests that may be done include:  X-rays of your stomach or chest.  An upper gastrointestinal (GI) series. This is an X-ray exam of your GI tract that is taken after you swallow a chalky liquid that shows up clearly on the X-ray.  Endoscopy. This is a procedure to look into your stomach using a thin, flexible tube that has a tiny camera and light on the end of it.  How is this treated? This condition may be treated by:  Dietary and lifestyle changes to help reduce GERD symptoms.  Medicines. These may include: ? Over-the-counter antacids. ? Medicines that make  your stomach empty more quickly. ? Medicines that block the production of stomach acid (H2 blockers). ? Stronger medicines to reduce stomach acid (proton pump inhibitors).  Surgery to repair the hernia, if other treatments are not helping.  If you have no symptoms, you may not need treatment. Follow these instructions at home: Lifestyle and activity  Do not use any products that contain nicotine or tobacco, such as cigarettes and e-cigarettes. If you need help quitting, ask your health care provider.  Try to achieve and maintain a healthy body weight.  Avoid putting pressure on your abdomen. Anything that puts pressure on your abdomen increases the amount of acid that may be pushed up into your esophagus. ? Avoid bending over, especially after eating. ? Raise the head of your bed by putting blocks under the legs. This keeps your head and esophagus higher than your stomach. ? Do not wear tight clothing around your chest or stomach. ? Try not to strain when having a bowel movement, when urinating, or when lifting heavy objects. Eating and drinking  Avoid foods that can worsen GERD symptoms. These  may include: ? Fatty foods, like fried foods. ? Citrus fruits, like oranges or lemon. ? Other foods and drinks that contain acid, like orange juice or tomatoes. ? Spicy food. ? Chocolate.  Eat frequent small meals instead of three large meals a day. This helps prevent your stomach from getting too full. ? Eat slowly. ? Do not lie down right after eating. ? Do not eat 1-2 hours before bed.  Do not drink beverages with caffeine. These include cola, coffee, cocoa, and tea.  Do not drink alcohol. General instructions  Take over-the-counter and prescription medicines only as told by your health care provider.  Keep all follow-up visits as told by your health care provider. This is important. Contact a health care provider if:  Your symptoms are not controlled with medicines or lifestyle  changes.  You are having trouble swallowing.  You have coughing or wheezing that will not go away. Get help right away if:  Your pain is getting worse.  Your pain spreads to your arms, neck, jaw, teeth, or back.  You have shortness of breath.  You sweat for no reason.  You feel sick to your stomach (nauseous) or you vomit.  You vomit blood.  You have bright red blood in your stools.  You have black, tarry stools. This information is not intended to replace advice given to you by your health care provider. Make sure you discuss any questions you have with your health care provider. Document Released: 09/25/2003 Document Revised: 06/28/2016 Document Reviewed: 06/28/2016 Elsevier Interactive Patient Education  2018 [employer].  Iron-Rich Diet Iron is a mineral that helps your body to produce hemoglobin. Hemoglobin is a protein in your red blood cells that carries oxygen to your body's tissues. Eating too little iron may cause you to feel weak and tired, and it can increase your risk for infection. Eating enough iron is necessary for your body's metabolism, muscle function, and nervous system. Iron is naturally found in many foods. It can also be added to foods or fortified in foods. There are two types of dietary iron:  Heme iron. Heme iron is absorbed by the body more easily than nonheme iron. Heme iron is found in meat, poultry, and fish.  Nonheme iron. Nonheme iron is found in dietary supplements, iron-fortified grains, beans, and vegetables.  You may need to follow an iron-rich diet if:  You have been diagnosed with iron deficiency or iron-deficiency anemia.  You have a condition that prevents you from absorbing dietary iron, such as: ? Infection in your intestines. ? Celiac disease. This involves long-lasting (chronic) inflammation of your intestines.  You do not eat enough iron.  You eat a diet that is high in foods that impair iron absorption.  You have lost a lot  of blood.  You have heavy bleeding during your menstrual cycle.  You are pregnant.  What is my plan? Your health care provider may help you to determine how much iron you need per day based on your condition. Generally, when a person consumes sufficient amounts of iron in the diet, the following iron needs are met:  Men. ? 55-66 years old: 11 mg per day. ? 71-29 years old: 8 mg per day.  Women. ? 28-42 years old: 15 mg per day. ? 37-39 years old: 18 mg per day. ? Over 29 years old: 8 mg per day. ? Pregnant women: 27 mg per day. ? Breastfeeding women: 9 mg per day.  What do I need to know  about an iron-rich diet?  Eat fresh fruits and vegetables that are high in vitamin C along with foods that are high in iron. This will help increase the amount of iron that your body absorbs from food, especially with foods containing nonheme iron. Foods that are high in vitamin C include oranges, peppers, tomatoes, and mango.  Take iron supplements only as directed by your health care provider. Overdose of iron can be life-threatening. If you were prescribed iron supplements, take them with orange juice or a vitamin C supplement.  Cook foods in pots and pans that are made from iron.  Eat nonheme iron-containing foods alongside foods that are high in heme iron. This helps to improve your iron absorption.  Certain foods and drinks contain compounds that impair iron absorption. Avoid eating these foods in the same meal as iron-rich foods or with iron supplements. These include: ? Coffee, black tea, and red wine. ? Milk, dairy products, and foods that are high in calcium. ? Beans, soybeans, and peas. ? Whole grains.  When eating foods that contain both nonheme iron and compounds that impair iron absorption, follow these tips to absorb iron better. ? Soak beans overnight before cooking. ? Soak whole grains overnight and drain them before using. ? Ferment flours before baking, such as using yeast in  bread dough. What foods can I eat? Grains Iron-fortified breakfast cereal. Iron-fortified whole-wheat bread. Enriched rice. Sprouted grains. Vegetables Spinach. Potatoes with skin. Green peas. Broccoli. Red and green bell peppers. Fermented vegetables. Fruits Prunes. Raisins. Oranges. Strawberries. Mango. Grapefruit. Meats and Other Protein Sources Beef liver. Oysters. Beef. Shrimp. [place]. Chicken. [place]. Sardines. Chickpeas. Nuts. Tofu. Beverages Tomato juice. Fresh orange juice. Prune juice. Hibiscus tea. Fortified instant breakfast shakes. Condiments Tahini. Fermented soy sauce. Sweets and Desserts Black-strap molasses. Other Wheat germ. The items listed above may not be a complete list of recommended foods or beverages. Contact your dietitian for more options. What foods are not recommended? Grains Whole grains. Bran cereal. Bran flour. Oats. Vegetables Artichokes. Brussels sprouts. Kale. Fruits Blueberries. Raspberries. Strawberries. Figs. Meats and Other Protein Sources Soybeans. Products made from soy protein. Dairy Milk. Cream. Cheese. Yogurt. Cottage cheese. Beverages Coffee. Black tea. Red wine. Sweets and Desserts Cocoa. Chocolate. Ice cream. Other Basil. Oregano. Parsley. The items listed above may not be a complete list of foods and beverages to avoid. Contact your dietitian for more information. This information is not intended to replace advice given to you by your health care provider. Make sure you discuss any questions you have with your health care provider. Document Released: 02/16/2005 Document Revised: 01/23/2016 Document Reviewed: 01/30/2014 Elsevier Interactive Patient Education  [employer].

## 2018-02-01 NOTE — Progress Notes (Addendum)
Subjective:    Patient ID: [name], male    DOB: 05-30-1951, 67 y.o.   MRN: [id number]  No chief complaint on file. Patient is accompanied by his fiance.  HPI Patient was seen today for HFU.  Pt was admitted 6/25-6/28/19 for symptomatic anemia (hgb 5.5)  2/2 esphagitis with hiatal hernia.  Pt is s/p 2 u pRBCs.  EGD revealed large hiatal hernia and nonbleeding gastropathy.  Patient was given IV iron as well as p.o. iron replacement.  Patient was also started on Protonix twice daily before meals and Carafate.  Since discharge patient states he is doing better.  Patient denies dark-colored stools, leading, or constipation.  He has been taking MiraLAX daily.  Patient has yet to have follow-up with gastroenterology, appt in Aug.  Patient has been taking clonidine twice daily. Pt is smoking less than he was before the hospital.  Pt was smoking 1 pk q 2 days.  Now may have 5-10 cigs/ day.  Pt mentions his L great toenail has been causing pain x 1 yr.  Pt endorses not being able to cut it and feeling like it may fall off. Past Medical History:  Diagnosis Date  . Arthritis   . [place] ulcer, chronic 01/11/2018  . Chronic back pain   . GERD (gastroesophageal reflux disease)    uses Baking Soda  . GIB (gastrointestinal bleeding) 2012  . Glaucoma    right eye  . Headache    occasionally  . Hepatitis C   . Hiatal hernia with gastroesophageal reflux disease and esophagitis 01/05/2018  . History of blood transfusion    no abnormal reaction noted  . History of gout    doesn't take any meds  . Hyperlipidemia    not on any meds  . Hypertension    takes Amlodipine and Lisinopril daily  . Insomnia    takes Ambien nightly  . Ischemic colitis ([place]) 2012  . Joint pain   . Nocturia   . Numbness    both legs occasionally  . PONV (postoperative nausea and vomiting)   . Prostate cancer ([place])   . Shortness of breath dyspnea    rarely but when notices he can be  lying/sitting/exertion.Dr.Hochrein is aware per pt  . Stroke ([place]) 2016   . Urinary frequency   . Urinary urgency     Allergies  Allergen Reactions  . Penicillins Anaphylaxis    Has patient had a PCN reaction causing immediate rash, facial/tongue/throat swelling, SOB or lightheadedness with hypotension: Yes Has patient had a PCN reaction causing severe rash involving mucus membranes or skin necrosis: No Has patient had a PCN reaction that required hospitalization: No Has patient had a PCN reaction occurring within the last 10 years: No If all of the above answers are "NO", then may proceed with Cephalosporin use..  . Sudafed [Pseudoephedrine] Other (See Comments)    Heart "races"  . Trazodone And Nefazodone Swelling    ROS General: Denies fever, chills, night sweats, changes in weight, changes in appetite HEENT: Denies headaches, ear pain, changes in vision, rhinorrhea, sore throat CV: Denies CP, palpitations, SOB, orthopnea Pulm: Denies SOB, cough, wheezing GI: Denies abdominal pain, nausea, vomiting, diarrhea, constipation GU: Denies dysuria, hematuria, frequency, vaginal discharge Msk: Denies muscle cramps, joint pains +L great toe pain  Neuro: Denies weakness, numbness, tingling Skin: Denies rashes, bruising  Psych: Denies depression, anxiety, hallucinations     Objective:    Blood pressure 110/60, pulse (!) 54, temperature 98 F (36.7 C),  temperature source Oral, weight 206 lb (93.4 kg), SpO2 98 %.   Gen. Pleasant, well-nourished, in no distress, normal affect   HEENT: [place]/AT, face symmetric, no scleral icterus, PERRLA, nares patent without drainage, pharynx without erythema or exudate. Lungs: no accessory muscle use, CTAB, no wheezes or rales Cardiovascular: RRR, no m/r/g, no peripheral edema Abdomen: BS present, soft, NT/ND Neuro:  A&Ox3, CN II-XII intact, normal gait Skin:  Warm, no lesions/ rash.  Onychomycosis of great toe with nail discoloration and partial  avulsion  Wt Readings from Last 3 Encounters:  02/01/18 206 lb (93.4 kg)  01/10/18 199 lb 11.8 oz (90.6 kg)  01/05/18 203 lb (92.1 kg)    Lab Results  Component Value Date   WBC 6.0 02/01/2018   HGB 9.9 (L) 02/01/2018   HCT 32.3 (L) 02/01/2018   PLT 366.0 02/01/2018   GLUCOSE 88 02/01/2018   CHOL 107 02/13/2016   TRIG 128.0 02/13/2016   HDL 33.60 (L) 02/13/2016   LDLCALC 48 02/13/2016   ALT 9 01/11/2018   AST 13 (L) 01/11/2018   NA 139 02/01/2018   K 4.5 02/01/2018   CL 104 02/01/2018   CREATININE 1.10 02/01/2018   BUN 13 02/01/2018   CO2 27 02/01/2018   TSH 1.510 01/11/2018   INR 1.2 (H) 05/12/2016   HGBA1C 5.7 02/13/2016    Assessment/Plan:  Hospital discharge follow-up -Hospital notes and TCM phone call reviewed  Hiatal hernia with gastroesophageal reflux disease and esophagitis  - Plan: CBC with Differential/Platelet, Basic metabolic panel -Patient encouraged to keep follow-up appointment with gastroenterology.  Cigarette nicotine dependence without complication -Smoking cessation counseling greater than 3 minutes, less than 10 minutes -Patient encouraged to continue decreasing amount of cigarettes he is smoking per day.  Currently smoking 5 to 10 cigarettes/day. -Discussed various options to help patient quit -Patient is considering his options. -We will readdress at each visit.  Microcytic anemia  -Continue ferrous sulfate 325 twice daily. -Continue MiraLAX as needed - Plan: CBC with Differential/Platelet  Essential hypertension -Controlled -Continue Norvasc 10 mg, clonidine 0.1 mg twice daily, HCTZ 12.5 mg, lisinopril 40 mg -Patient encouraged to check BP at home and keep a log to bring with him to clinic each visit  Onychomycosis -Discussed various treatment options patient advised will likely take months to treat fungal nail infection -also needs nail trimming - Plan: Ambulatory referral to Podiatry  Follow-up in 1 to 2 months  [name],  MD

## 2018-02-03 ENCOUNTER — Encounter: Payer: Self-pay | Admitting: Pulmonary Disease

## 2018-02-03 ENCOUNTER — Ambulatory Visit (INDEPENDENT_AMBULATORY_CARE_PROVIDER_SITE_OTHER): Payer: Medicare Other | Admitting: Pulmonary Disease

## 2018-02-03 VITALS — BP 148/72 | HR 56 | Ht 69.0 in | Wt 206.6 lb

## 2018-02-03 DIAGNOSIS — G4700 Insomnia, unspecified: Secondary | ICD-10-CM | POA: Diagnosis not present

## 2018-02-03 DIAGNOSIS — F5102 Adjustment insomnia: Secondary | ICD-10-CM | POA: Diagnosis not present

## 2018-02-03 MED ORDER — TRAZODONE HCL 50 MG PO TABS: 50.0000 mg | ORAL_TABLET | Freq: Every day | ORAL | 2 refills | Status: DC

## 2018-02-03 NOTE — Patient Instructions (Signed)
Take trazodone at 1130 pm Go to bed at midnight Wake up at 7 am Do not sleep during the day  Follow up in 2 months

## 2018-02-03 NOTE — Progress Notes (Signed)
[facility]  Chief Complaint  Patient presents with  . Sleep Consult    Referred by Dr. Carlota Raspberry at [facility] for insomnia. Per patient, he states that he does not sleep at night. This has been going on for the past year. Denies ever having a SS before.      Constitutional: BP (!) 148/72 (BP Location: Right Arm, Patient Position: Sitting, Cuff Size: Normal)   Pulse (!) 56   Ht 5\' 9"  (1.753 m)   Wt 206 lb 9.6 oz (93.7 kg)   SpO2 98%   BMI 30.51 kg/m   History of Present Illness: [name] is a 67 y.o. male with insomnia.  He is here with his fiancee.  He has noticed trouble with his sleep for years.  This has been getting worse.  He says he never sleeps at all.  He can't remember the last time he has slept.  He stays awake all night long and watches TV.  He also says he doesn't sleep during the day.  He is always tired and stressed about his trouble sleeping.  His fiancee says he does fall asleep randomly during the day and night.  He sleeps for about 30 minutes and then wakes up.  She lets him sleep whenever he wants during the day because he has so much trouble sleeping.  He falls asleep during day randomly when watching TV.  He is a mild snorer.  His is not aware of having trouble breathing while asleep.    He has tried [place] before.  This helped him fall asleep, but not stay asleep.  He has also tried several OCT sleep aides.  He has chronic low back pain from sciatica.  He uses cymbalta and norco in the morning.  He has appointment with pain specialist coming up.  He has prostate trouble and uses the bathroom frequently, including at night.  He denies sleep walking, sleep talking, bruxism, or nightmares.  There is no history of restless legs.  He denies sleep hallucinations, sleep paralysis, or cataplexy.  The Epworth score is 0 out of 24.    Comprehensive Respiratory Exam:  Appearance - well kempt  ENMT - nasal mucosa moist,  turbinates clear, midline nasal septum, edentulous, no gingival bleeding, no oral exudates, no tonsillar hypertrophy Neck - no masses, trachea midline, no thyromegaly, no elevation in JVP Respiratory - normal appearance of chest wall, normal respiratory effort w/o accessory muscle use, no dullness on percussion, no wheezing or rales CV - s1s2 regular rate and rhythm, no murmurs, no peripheral edema, no varicosities, radial pulses symmetric GI - soft, non tender, no masses, no hepatosplenomegaly Lymph - no adenopathy noted in neck and axillary areas MSK - normal muscle strength and tone, walks with a cane Ext - no cyanosis, clubbing, or joint inflammation noted Skin - no rashes, lesions, or ulcers Neuro - oriented to person, place, and time Psych - normal mood and affect   Assessment/Plan:  Insomnia. - discussed sleep restriction, stimulus control, and relaxation techniques - proper sleep hygiene discussed - explained this will be a slow process to get him into a regular sleep pattern, and he needs to be persistent with treatment plan - will have him use trazodone nightly for now, but explained that plan would be to eventually transition him off this at some point - will defer sleep study for now  Sleep state misperception. - explained that it can be difficult at times to appreciate sleep  periods when they are brief, but that he is in fact sleeping some   Patient Instructions  Take trazodone at 1130 pm Go to bed at midnight Wake up at 7 am Do not sleep during the day  Follow up in 2 months    Chesley Mires, MD [place] 02/03/2018, 11:10 AM  Flow Sheet  Sleep tests:  Cardiac tests: Echo 05/31/15 >> mod LVH, EF 60 to 65%, mild AR  Review of Systems: Constitutional: Negative for fever and unexpected weight change.  HENT: Negative for congestion, dental problem, ear pain, nosebleeds, postnasal drip, rhinorrhea, sinus pressure, sneezing, sore throat and  trouble swallowing.   Eyes: Negative for redness and itching.  Respiratory: Positive for shortness of breath. Negative for cough, chest tightness and wheezing.   Cardiovascular: Negative for palpitations and leg swelling.  Gastrointestinal: Negative for nausea and vomiting.  Genitourinary: Negative for dysuria.  Musculoskeletal: Positive for joint swelling.  Skin: Negative for rash.  Allergic/Immunologic: Negative.  Negative for environmental allergies, food allergies and immunocompromised state.  Neurological: Negative for headaches.  Hematological: Does not bruise/bleed easily.  Psychiatric/Behavioral: Negative for dysphoric mood. The patient is not nervous/anxious.    Past Medical History: He  has a past medical history of Arthritis, [name] ulcer, chronic (01/11/2018), Chronic back pain, GERD (gastroesophageal reflux disease), GIB (gastrointestinal bleeding) (2012), Glaucoma, Headache, Hepatitis C, Hiatal hernia with gastroesophageal reflux disease and esophagitis (01/05/2018), History of blood transfusion, History of gout, Hyperlipidemia, Hypertension, Insomnia, Ischemic colitis ([place]) (2012), Joint pain, Nocturia, Numbness, PONV (postoperative nausea and vomiting), Prostate cancer ([place]), Shortness of breath dyspnea, Stroke ([place]) (2016 ), Urinary frequency, and Urinary urgency.  Past Surgical History: He  has a past surgical history that includes Appendectomy; Small intestine surgery; Multiple abdominal surgeries; Prostatectomy; Colonoscopy; Esophagogastroduodenoscopy; Inguinal hernia repair (Right, 11/04/2014); Mass excision (N/A, 11/04/2014); Esophagogastroduodenoscopy (N/A, 10/26/2015); Colonoscopy (N/A, 10/26/2015); Esophagogastroduodenoscopy (egd) with propofol (N/A, 01/11/2018); and biopsy (01/11/2018).  Family History: His family history includes Alzheimer's disease in his father; CAD in his brother; Cancer in his mother; Heart disease (age of onset: 61) in his sister; Heart failure (age of  onset: 71) in his brother.  Social History: He  reports that he has been smoking cigarettes.  He has a 25.00 pack-year smoking history. He has never used smokeless tobacco. He reports that he drinks alcohol. He reports that he does not use drugs.  Medications: Allergies as of 02/03/2018      Reactions   Penicillins Anaphylaxis   Has patient had a PCN reaction causing immediate rash, facial/tongue/throat swelling, SOB or lightheadedness with hypotension: Yes Has patient had a PCN reaction causing severe rash involving mucus membranes or skin necrosis: No Has patient had a PCN reaction that required hospitalization: No Has patient had a PCN reaction occurring within the last 10 years: No If all of the above answers are "NO", then may proceed with Cephalosporin use.[name] [pseudoephedrine] Other (See Comments)   Heart "races"      Medication List        Accurate as of 02/03/18 11:10 AM. Always use your most recent med list.          amLODipine 10 MG tablet Commonly known as:  NORVASC Take 1 tablet (10 mg total) by mouth daily.   bisacodyl 5 MG EC tablet Commonly known as:  DULCOLAX Take 1 tablet (5 mg total) by mouth daily as needed for moderate constipation.   cloNIDine 0.1 MG tablet Commonly known as:  CATAPRES Take 1  tablet (0.1 mg total) by mouth 2 (two) times daily.   Diclofenac Sodium 3 % Gel Place 1 application onto the skin 2 (two) times daily. To affected area.   DULoxetine 30 MG capsule Commonly known as:  CYMBALTA Take 1 capsule (30 mg total) by mouth 2 (two) times daily.   ferrous sulfate 325 (65 FE) MG tablet Take 1 tablet (325 mg total) by mouth 3 (three) times daily with meals.   hydrochlorothiazide 12.5 MG capsule Commonly known as:  MICROZIDE Take 1 capsule (12.5 mg total) by mouth daily.   HYDROcodone-acetaminophen 5-325 MG tablet Commonly known as:  NORCO/VICODIN Take 1 tablet by mouth every 6 (six) hours as needed for moderate pain.     lisinopril 40 MG tablet Commonly known as:  PRINIVIL,ZESTRIL Take 1 tablet (40 mg total) by mouth daily.   pantoprazole 40 MG tablet Commonly known as:  PROTONIX Take 1 tablet (40 mg total) by mouth 2 (two) times daily before a meal.   polyethylene glycol packet Commonly known as:  MIRALAX / GLYCOLAX Take 17 g by mouth daily as needed for severe constipation.   sucralfate 1 GM/10ML suspension Commonly known as:  CARAFATE Take 10 mLs (1 g total) by mouth 4 (four) times daily -  with meals and at bedtime for 15 days.   traZODone 50 MG tablet Commonly known as:  DESYREL Take 1 tablet (50 mg total) by mouth at bedtime.

## 2018-02-03 NOTE — Progress Notes (Signed)
   Subjective:    Patient ID: [name], male    DOB: 1950/12/18, 67 y.o.   MRN: [id number]  HPI    Review of Systems  Constitutional: Negative for fever and unexpected weight change.  HENT: Negative for congestion, dental problem, ear pain, nosebleeds, postnasal drip, rhinorrhea, sinus pressure, sneezing, sore throat and trouble swallowing.   Eyes: Negative for redness and itching.  Respiratory: Positive for shortness of breath. Negative for cough, chest tightness and wheezing.   Cardiovascular: Negative for palpitations and leg swelling.  Gastrointestinal: Negative for nausea and vomiting.  Genitourinary: Negative for dysuria.  Musculoskeletal: Positive for joint swelling.  Skin: Negative for rash.  Allergic/Immunologic: Negative.  Negative for environmental allergies, food allergies and immunocompromised state.  Neurological: Negative for headaches.  Hematological: Does not bruise/bleed easily.  Psychiatric/Behavioral: Negative for dysphoric mood. The patient is not nervous/anxious.        Objective:   Physical Exam        Assessment & Plan:

## 2018-02-13 ENCOUNTER — Ambulatory Visit: Payer: Medicare Other | Admitting: Podiatry

## 2018-02-20 ENCOUNTER — Ambulatory Visit: Payer: Medicare Other | Admitting: Gastroenterology

## 2018-02-23 ENCOUNTER — Other Ambulatory Visit: Payer: Self-pay | Admitting: Family Medicine

## 2018-03-02 ENCOUNTER — Ambulatory Visit (INDEPENDENT_AMBULATORY_CARE_PROVIDER_SITE_OTHER): Payer: Medicare Other

## 2018-03-02 ENCOUNTER — Ambulatory Visit (INDEPENDENT_AMBULATORY_CARE_PROVIDER_SITE_OTHER): Payer: Medicare Other | Admitting: Podiatry

## 2018-03-02 DIAGNOSIS — M79675 Pain in left toe(s): Secondary | ICD-10-CM

## 2018-03-02 DIAGNOSIS — M79674 Pain in right toe(s): Secondary | ICD-10-CM

## 2018-03-02 DIAGNOSIS — B351 Tinea unguium: Secondary | ICD-10-CM

## 2018-03-02 NOTE — Progress Notes (Signed)
Subjective:   Patient ID: [name], male   DOB: 67 y.o.   MRN: [id number]   HPI 67 year old male presents the office today for concerns of pain to all of his toenails specifically his left big toe.  He states the nail is very thick and has not been trimmed in several years and causing quite a bit of discomfort most with pressure in shoes.  He denies any redness or drainage to the toenail sites.  He said no recent treatment.  No recent injury to his feet.  He has no other concerns.   Review of Systems  All other systems reviewed and are negative.  Past Medical History:  Diagnosis Date  . Arthritis   . [place] ulcer, chronic 01/11/2018  . Chronic back pain   . GERD (gastroesophageal reflux disease)    uses Baking Soda  . GIB (gastrointestinal bleeding) 2012  . Glaucoma    right eye  . Headache    occasionally  . Hepatitis C   . Hiatal hernia with gastroesophageal reflux disease and esophagitis 01/05/2018  . History of blood transfusion    no abnormal reaction noted  . History of gout    doesn't take any meds  . Hyperlipidemia    not on any meds  . Hypertension    takes Amlodipine and Lisinopril daily  . Insomnia    takes Ambien nightly  . Ischemic colitis ([place]) 2012  . Joint pain   . Nocturia   . Numbness    both legs occasionally  . PONV (postoperative nausea and vomiting)   . Prostate cancer ([place])   . Shortness of breath dyspnea    rarely but when notices he can be lying/sitting/exertion.Dr.Hochrein is aware per pt  . Stroke ([place]) 2016   . Urinary frequency   . Urinary urgency     Past Surgical History:  Procedure Laterality Date  . APPENDECTOMY    . BIOPSY  01/11/2018   Procedure: BIOPSY;  Surgeon: Gatha Mayer, MD;  Location: [facility] ENDOSCOPY;  Service: Endoscopy;;  . COLONOSCOPY    . COLONOSCOPY N/A 10/26/2015   Procedure: COLONOSCOPY;  Surgeon: Doran Stabler, MD;  [facility] ENDOSCOPY;  Service: Endoscopy;  Laterality: N/A;  .  ESOPHAGOGASTRODUODENOSCOPY    . ESOPHAGOGASTRODUODENOSCOPY N/A 10/26/2015   Procedure: ESOPHAGOGASTRODUODENOSCOPY (EGD);  Surgeon: Doran Stabler, MD;  Location: [facility] ENDOSCOPY;  Service: Endoscopy;  Laterality: N/A;  . ESOPHAGOGASTRODUODENOSCOPY (EGD) WITH PROPOFOL N/A 01/11/2018   Procedure: ESOPHAGOGASTRODUODENOSCOPY (EGD) WITH PROPOFOL;  Surgeon: Gatha Mayer, MD;  Location: [facility] ENDOSCOPY;  Service: Endoscopy;  Laterality: N/A;  . INGUINAL HERNIA REPAIR Right 11/04/2014   Procedure: RIGHT INGUINAL HERNIA REPAIR WITH MESH;  Surgeon: Jackolyn Confer, MD;  Location: [facility];  Service: General;  Laterality: Right;  . MASS EXCISION N/A 11/04/2014   Procedure: REMOVAL OF RIGHT GROIN SOFT TISSUE MASS;  Surgeon: Jackolyn Confer, MD;  Location: [facility];  Service: General;  Laterality: N/A;  . Multiple abdominal surgeries     total of 13  . PROSTATECTOMY    . SMALL INTESTINE SURGERY       Current Outpatient Medications:  .  amLODipine (NORVASC) 10 MG tablet, Take 1 tablet (10 mg total) by mouth daily., Disp: 90 tablet, Rfl: 1 .  BISACODYL 5 MG EC tablet, TAKE 1 TABLET BY MOUTH AS NEEDED FOR MODERATE CONSTIPATION, Disp: 30 tablet, Rfl: 0 .  cloNIDine (CATAPRES) 0.1 MG tablet, Take 1 tablet (0.1 mg total) by mouth 2 (two)  times daily., Disp: 60 tablet, Rfl: 11 .  Diclofenac Sodium 3 % GEL, Place 1 application onto the skin 2 (two) times daily. To affected area., Disp: 100 g, Rfl: 0 .  DULoxetine (CYMBALTA) 30 MG capsule, Take 1 capsule (30 mg total) by mouth 2 (two) times daily., Disp: 60 capsule, Rfl: 1 .  ferrous sulfate 325 (65 FE) MG tablet, TAKE 1 TABLET BY MOUTH THREE TIMES A DAY WITH MEALS, Disp: 90 tablet, Rfl: 0 .  hydrochlorothiazide (MICROZIDE) 12.5 MG capsule, Take 1 capsule (12.5 mg total) by mouth daily., Disp: 90 capsule, Rfl: 1 .  HYDROcodone-acetaminophen (NORCO/VICODIN) 5-325 MG tablet, Take 1 tablet by mouth every 6 (six) hours as needed for moderate pain., Disp: 30 tablet, Rfl: 0 .   lisinopril (PRINIVIL,ZESTRIL) 40 MG tablet, Take 1 tablet (40 mg total) by mouth daily., Disp: 90 tablet, Rfl: 1 .  pantoprazole (PROTONIX) 40 MG tablet, TAKE 1 TABLET BY MOUTH TWICE A DAY BEFORE A MEAL, Disp: 60 tablet, Rfl: 0 .  polyethylene glycol (MIRALAX / GLYCOLAX) packet, Take 17 g by mouth daily as needed for severe constipation., Disp: 14 each, Rfl: 0 .  sucralfate (CARAFATE) 1 GM/10ML suspension, Take 10 mLs (1 g total) by mouth 4 (four) times daily -  with meals and at bedtime for 15 days., Disp: 420 mL, Rfl: 0 .  traZODone (DESYREL) 50 MG tablet, Take 1 tablet (50 mg total) by mouth at bedtime., Disp: 30 tablet, Rfl: 2  Allergies  Allergen Reactions  . Penicillins Anaphylaxis    Has patient had a PCN reaction causing immediate rash, facial/tongue/throat swelling, SOB or lightheadedness with hypotension: Yes Has patient had a PCN reaction causing severe rash involving mucus membranes or skin necrosis: No Has patient had a PCN reaction that required hospitalization: No Has patient had a PCN reaction occurring within the last 10 years: No If all of the above answers are "NO", then may proceed with Cephalosporin use..  . Sudafed [Pseudoephedrine] Other (See Comments)    Heart "races"         Objective:  Physical Exam  General: AAO x3, NAD  Dermatological: Nails are hypertrophic, dystrophic, brittle, discolored, elongated 10.  Particularly the left hallux toenail significantly hypertrophic and very brittle.  No surrounding redness or drainage. Ingrowing on the right hallux toenail without signs of infection. Tenderness nails 1-5 bilaterally. No open lesions or pre-ulcerative lesions are identified today.   Vascular: Dorsalis Pedis artery and Posterior Tibial artery pedal pulses are 2/4 bilateral with immedate capillary fill time. There is no pain with calf compression, swelling, warmth, erythema.   Neruologic: Grossly intact via light touch bilateral.Protective threshold with  Semmes Wienstein monofilament intact to all pedal sites bilateral.   Musculoskeletal: Tenderness to the toenails however there is no area pinpoint tenderness.  Muscular strength 5/5 in all groups tested bilateral.    Assessment:   Toe pain; symptomatic onychomycosis     Plan:  -Treatment options discussed including all alternatives, risks, and complications -Etiology of symptoms were discussed -X-rays were obtained and reviewed with the patient.  On the AP view of the left hallux there is a radiolucent line transversely to the family's but this is a shadow as there is no evidence of acute fracture in the other views.  There is no evidence of acute fracture bilaterally. -Nails sharply debrided x10 without any complications or bleeding.  I recommended total nail avulsion with chemical matricectomy to the left hallux toenail but he declined this. -RTC 3 months  or sooner if needed  Trula Slade DPM

## 2018-03-14 ENCOUNTER — Other Ambulatory Visit (INDEPENDENT_AMBULATORY_CARE_PROVIDER_SITE_OTHER): Payer: Medicare Other

## 2018-03-14 ENCOUNTER — Encounter: Payer: Self-pay | Admitting: Gastroenterology

## 2018-03-14 ENCOUNTER — Ambulatory Visit (INDEPENDENT_AMBULATORY_CARE_PROVIDER_SITE_OTHER): Payer: Medicare Other | Admitting: Gastroenterology

## 2018-03-14 VITALS — BP 140/70 | HR 68 | Ht 69.0 in | Wt 193.0 lb

## 2018-03-14 DIAGNOSIS — K449 Diaphragmatic hernia without obstruction or gangrene: Secondary | ICD-10-CM | POA: Diagnosis not present

## 2018-03-14 DIAGNOSIS — D500 Iron deficiency anemia secondary to blood loss (chronic): Secondary | ICD-10-CM

## 2018-03-14 LAB — CBC WITH DIFFERENTIAL/PLATELET
Basophils Absolute: 0.1 10*3/uL (ref 0.0–0.1)
Basophils Relative: 2 % (ref 0.0–3.0)
Eosinophils Absolute: 0 10*3/uL (ref 0.0–0.7)
Eosinophils Relative: 0.8 % (ref 0.0–5.0)
HCT: 32.8 % — ABNORMAL LOW (ref 39.0–52.0)
Hemoglobin: 10.4 g/dL — ABNORMAL LOW (ref 13.0–17.0)
Lymphocytes Relative: 34.7 % (ref 12.0–46.0)
Lymphs Abs: 1.7 10*3/uL (ref 0.7–4.0)
MCHC: 31.7 g/dL (ref 30.0–36.0)
MCV: 81.4 fl (ref 78.0–100.0)
Monocytes Absolute: 0.4 10*3/uL (ref 0.1–1.0)
Monocytes Relative: 9 % (ref 3.0–12.0)
Neutro Abs: 2.6 10*3/uL (ref 1.4–7.7)
Neutrophils Relative %: 53.5 % (ref 43.0–77.0)
Platelets: 489 10*3/uL — ABNORMAL HIGH (ref 150.0–400.0)
RBC: 4.03 Mil/uL — ABNORMAL LOW (ref 4.22–5.81)
RDW: 24.2 % — ABNORMAL HIGH (ref 11.5–15.5)
WBC: 4.9 10*3/uL (ref 4.0–10.5)

## 2018-03-14 LAB — IBC PANEL
Iron: 54 ug/dL (ref 42–165)
Saturation Ratios: 16.1 % — ABNORMAL LOW (ref 20.0–50.0)
Transferrin: 240 mg/dL (ref 212.0–360.0)

## 2018-03-14 LAB — FERRITIN: Ferritin: 17.8 ng/mL — ABNORMAL LOW (ref 22.0–322.0)

## 2018-03-14 NOTE — Patient Instructions (Addendum)
If you are age 69 or older, your body mass index should be between 23-30. Your Body mass index is 28.5 kg/m. If this is out of the aforementioned range listed, please consider follow up with your Primary Care Provider.  If you are age 40 or younger, your body mass index should be between 19-25. Your Body mass index is 28.5 kg/m. If this is out of the aformentioned range listed, please consider follow up with your Primary Care Provider.   Your provider has requested that you go to the basement level for lab work before leaving today. Press "B" on the elevator. The lab is located at the first door on the left as you exit the elevator.  You have sent your records to [facility] for an appointment. The office will call to schedule. Please arrive 15 min early registration. Make certain to bring a list of current medications, including any over the counter medications or vitamins. Also bring your co-pay if you have one as well as your insurance cards. [facility] is located at [address], [address]. Should you need to reschedule your appointment, please contact them at [phone].  It was a pleasure to see you today!  Dr. Loletha Carrow

## 2018-03-14 NOTE — Progress Notes (Signed)
[facility] Progress Note  Chief Complaint: Iron deficiency anemia  Subjective  History:  This is a 67 year old man known to me from a hospital consult and follow-up in June 2017.  He has long-standing iron deficiency anemia and reports intermittent dark stool.  Endoscopic work-up did not find a source of anemia in 2017, but he had a large hiatal hernia.  He did not follow-up in clinic and it is not clear how closely he is follow-up with primary care.  He was readmitted to our hospital in June of this year for an episode of syncope, and found to have a significant microcytic anemia with hemoglobin of 7.7.  His ferritin was 1, and he had not been taking iron regularly. Dr. Carlean Purl performed an upper endoscopy, and I reviewed the report in detail.  There was a 10 cm hiatal hernia with areas of friable mucosa within the herniated gastric cardia/fundus.  This was felt likely to be the source of chronic GI blood loss. [name] reports intermittent regurgitation and heartburn that is partially controlled with medicine.  He denies dysphagia odynophagia vomiting or weight loss.  ROS: Cardiovascular:  no chest pain Respiratory: no dyspnea Chronic mobility issues from prior CVA.  He has left arm and leg weakness.  Despite that he is quite functional. Remainder of systems negative except as above The patient's Past Medical, Family and Social History were reviewed and are on file in the EMR. Past Medical History:  Diagnosis Date  . Arthritis   . Cameron ulcer, chronic 01/11/2018  . Chronic back pain   . GERD (gastroesophageal reflux disease)    uses Baking Soda  . GIB (gastrointestinal bleeding) 2012  . Glaucoma    right eye  . Headache    occasionally  . Hepatitis C   . Hiatal hernia with gastroesophageal reflux disease and esophagitis 01/05/2018  . History of blood transfusion    no abnormal reaction noted  . History of gout    doesn't take any meds  . Hyperlipidemia    not on any  meds  . Hypertension    takes Amlodipine and Lisinopril daily  . Insomnia    takes Ambien nightly  . Ischemic colitis ([place]) 2012  . Joint pain   . Nocturia   . Numbness    both legs occasionally  . PONV (postoperative nausea and vomiting)   . Prostate cancer ([place])   . Shortness of breath dyspnea    rarely but when notices he can be lying/sitting/exertion.Dr.Hochrein is aware per pt  . Stroke ([place]) 2016   . Urinary frequency   . Urinary urgency     Objective:  Med list reviewed  Current Outpatient Medications:  .  amLODipine (NORVASC) 10 MG tablet, Take 1 tablet (10 mg total) by mouth daily., Disp: 90 tablet, Rfl: 1 .  BISACODYL 5 MG EC tablet, TAKE 1 TABLET BY MOUTH AS NEEDED FOR MODERATE CONSTIPATION, Disp: 30 tablet, Rfl: 0 .  cloNIDine (CATAPRES) 0.1 MG tablet, Take 1 tablet (0.1 mg total) by mouth 2 (two) times daily., Disp: 60 tablet, Rfl: 11 .  Diclofenac Sodium 3 % GEL, Place 1 application onto the skin 2 (two) times daily. To affected area., Disp: 100 g, Rfl: 0 .  DULoxetine (CYMBALTA) 30 MG capsule, Take 1 capsule (30 mg total) by mouth 2 (two) times daily., Disp: 60 capsule, Rfl: 1 .  ferrous sulfate 325 (65 FE) MG tablet, TAKE 1 TABLET BY MOUTH THREE TIMES A DAY WITH MEALS,  Disp: 90 tablet, Rfl: 0 .  hydrochlorothiazide (MICROZIDE) 12.5 MG capsule, Take 1 capsule (12.5 mg total) by mouth daily., Disp: 90 capsule, Rfl: 1 .  HYDROcodone-acetaminophen (NORCO/VICODIN) 5-325 MG tablet, Take 1 tablet by mouth every 6 (six) hours as needed for moderate pain., Disp: 30 tablet, Rfl: 0 .  lisinopril (PRINIVIL,ZESTRIL) 40 MG tablet, Take 1 tablet (40 mg total) by mouth daily., Disp: 90 tablet, Rfl: 1 .  pantoprazole (PROTONIX) 40 MG tablet, TAKE 1 TABLET BY MOUTH TWICE A DAY BEFORE A MEAL, Disp: 60 tablet, Rfl: 0 .  polyethylene glycol (MIRALAX / GLYCOLAX) packet, Take 17 g by mouth daily as needed for severe constipation., Disp: 14 each, Rfl: 0 .  traZODone (DESYREL) 50 MG tablet,  Take 1 tablet (50 mg total) by mouth at bedtime., Disp: 30 tablet, Rfl: 2 .  sucralfate (CARAFATE) 1 GM/10ML suspension, Take 10 mLs (1 g total) by mouth 4 (four) times daily -  with meals and at bedtime for 15 days., Disp: 420 mL, Rfl: 0   Vital signs in last 24 hrs: Vitals:   03/14/18 1350  BP: 140/70  Pulse: 68    Physical Exam  Chronically ill-appearing man, slow antalgic gait, gets on exam table without assistance.  HEENT: sclera anicteric, oral mucosa moist without lesions  Neck: supple, no thyromegaly, JVD or lymphadenopathy  Cardiac: RRR without murmurs, S1S2 heard, no peripheral edema  Pulm: clear to auscultation bilaterally, normal RR and effort noted  Abdomen: soft, no tenderness, with active bowel sounds. No guarding or palpable hepatosplenomegaly.  He has abdominal scars from prior surgery including a midline one that appears to have healed at least partially by secondary intention as well as an incisional hernia most notable with Valsalva.  Skin; warm and dry, no jaundice or rash Mildly decreased hand squeeze and flexor function left hand/arm as well as extension of left leg compared to right. Recent Labs:  CBC Latest Ref Rng & Units 03/14/2018 02/01/2018 01/13/2018  WBC 4.0 - 10.5 K/uL 4.9 6.0 5.3  Hemoglobin 13.0 - 17.0 g/dL 10.4(L) 9.9(L) 8.2(L)  Hematocrit 39.0 - 52.0 % 32.8(L) 32.3(L) 29.6(L)  Platelets 150.0 - 400.0 K/uL 489.0(H) 366.0 286   CMP Latest Ref Rng & Units 02/01/2018 01/13/2018 01/12/2018  Glucose 70 - 99 mg/dL 88 92 82  BUN 6 - 23 mg/dL 13 19 15   Creatinine 0.40 - 1.50 mg/dL 1.10 0.97 1.14  Sodium 135 - 145 mEq/L 139 139 137  Potassium 3.5 - 5.1 mEq/L 4.5 4.2 3.6  Chloride 96 - 112 mEq/L 104 105 105  CO2 19 - 32 mEq/L 27 26 22   Calcium 8.4 - 10.5 mg/dL 9.2 9.1 9.0  Total Protein 6.5 - 8.1 g/dL - - -  Total Bilirubin 0.3 - 1.2 mg/dL - - -  Alkaline Phos 38 - 126 U/L - - -  AST 15 - 41 U/L - - -  ALT 0 - 44 U/L - - -   Iron 7, tibc 517   Ferritin 1 - very similar iron studies in 10/2015  Today: Iron 54, ferritin 18  Radiologic studies:  See EGD report by Carlean Purl - large HH with friable cardia mucosa and non-erosive esophagitis   UGIS 01/12/18:  FINDINGS: Scout image shows a normal bowel gas pattern.   Oblique pharyngeal imaging is negative for aspiration, obstructive process, or diverticulum.   The esophagus has normal distensibility and smooth mucosal contour. There is esophagitis by endoscopy that is not visualized radiographically. Normal motility. A 13  mm barium tablet easily reached the stomach.   Moderate hiatal hernia with approximately 1/3 of the stomach herniated. Sliding type morphology. Truncated gastric filming to limit radiation in this patient with mucosal exam yesterday. Normal fold pattern and distensibility. Normal fold pattern and course of the duodenum.   IMPRESSION: Moderate sliding hiatal hernia involving a third of the stomach.   FINDINGS: Scout image shows a normal bowel gas pattern.   Oblique pharyngeal imaging is negative for aspiration, obstructive process, or diverticulum.   The esophagus has normal distensibility and smooth mucosal contour. There is esophagitis by endoscopy that is not visualized radiographically. Normal motility. A 13 mm barium tablet easily reached the stomach.   Moderate hiatal hernia with approximately 1/3 of the stomach herniated. Sliding type morphology. Truncated gastric filming to limit radiation in this patient with mucosal exam yesterday. Normal fold pattern and distensibility. Normal fold pattern and course of the duodenum.   IMPRESSION: Moderate sliding hiatal hernia involving a third of the stomach.    @ASSESSMENTPLANBEGIN @ Assessment: Encounter Diagnoses  Name Primary?  Marland Kitchen Anemia due to chronic blood loss Yes  . Hiatal hernia     The large hiatal hernia has caused friability of the gastric mucosa that appears to be the source of chronic  GI blood loss leading to severe anemia.  His hemoglobin has been coming up on iron, but it looks like it could be even better.  If he is not able to tolerate a higher dose of oral iron, we may arrange a dose of IV iron. There was consideration of an outpatient surgical consult for the high hernia when he was seen in the hospital in June.  I do not know if he will be considered a surgical candidate due to his medical condition and multiple prior surgeries.  However, I would like him to have a surgical consultation so it can be considered.  If surgery is felt not to be wise or feasible, then we would have to watch his blood counts closely and give him sufficient iron to keep him from developing severe anemia.  (CBC and iron studies were available after his office visit today, and he will be called with results and plan)  Total time 30 minutes, over half spent face-to-face with patient in counseling and coordination of care.   Nelida Meuse III

## 2018-03-16 ENCOUNTER — Other Ambulatory Visit: Payer: Self-pay

## 2018-03-16 DIAGNOSIS — D500 Iron deficiency anemia secondary to blood loss (chronic): Secondary | ICD-10-CM

## 2018-03-17 ENCOUNTER — Telehealth: Payer: Self-pay

## 2018-03-17 NOTE — Telephone Encounter (Signed)
Pt is scheduled at [facility] on 04-20-2018 @ 145pm for [facility] consult.

## 2018-03-22 ENCOUNTER — Ambulatory Visit: Payer: Medicare Other | Admitting: Family Medicine

## 2018-03-22 DIAGNOSIS — Z0289 Encounter for other administrative examinations: Secondary | ICD-10-CM

## 2018-03-24 ENCOUNTER — Ambulatory Visit (HOSPITAL_COMMUNITY)
Admission: RE | Admit: 2018-03-24 | Discharge: 2018-03-24 | Disposition: A | Discharge status: Home / Self Care | Payer: Medicare Other | Source: Ambulatory Visit | Attending: Gastroenterology | Admitting: Gastroenterology

## 2018-03-24 DIAGNOSIS — D500 Iron deficiency anemia secondary to blood loss (chronic): Secondary | ICD-10-CM | POA: Diagnosis not present

## 2018-03-24 MED ORDER — SODIUM CHLORIDE 0.9 % IV SOLN
510.0000 mg | Freq: Once | INTRAVENOUS | Status: AC
  Administered 2018-03-24: 510 mg via INTRAVENOUS
  Filled 2018-03-24: qty 17

## 2018-03-24 NOTE — Discharge Instructions (Signed)

## 2018-03-30 ENCOUNTER — Ambulatory Visit (INDEPENDENT_AMBULATORY_CARE_PROVIDER_SITE_OTHER): Payer: Medicare Other | Admitting: Family Medicine

## 2018-03-30 ENCOUNTER — Encounter: Payer: Self-pay | Admitting: Family Medicine

## 2018-03-30 VITALS — BP 142/78 | HR 60 | Temp 97.7°F | Wt 190.0 lb

## 2018-03-30 DIAGNOSIS — G8929 Other chronic pain: Secondary | ICD-10-CM | POA: Diagnosis not present

## 2018-03-30 MED ORDER — FERROUS SULFATE 325 (65 FE) MG PO TABS: ORAL_TABLET | ORAL | 3 refills | Status: DC

## 2018-03-30 MED ORDER — TRAZODONE HCL 50 MG PO TABS: 50.0000 mg | ORAL_TABLET | Freq: Every day | ORAL | 2 refills | Status: DC

## 2018-03-30 MED ORDER — BISACODYL 5 MG PO TBEC: DELAYED_RELEASE_TABLET | ORAL | 0 refills | Status: DC

## 2018-03-30 NOTE — Progress Notes (Signed)
Subjective:    Patient ID: [name], male    DOB: October 19, 1950, 67 y.o.   MRN: [id number]  No chief complaint on file.   HPI Patient was seen today for ongoing concern.  Pt states he is "in pain and can't take it anymore".  Pt states he received a letter stating pain management couldn't help him.  Pt has not seen his orthopedic surgeon in a while, was receiving injections.  Pt tried heat but it does not help.  Pt endorses pain in hips, back, left shoulder.  Past Medical History:  Diagnosis Date  . Arthritis   . Cameron ulcer, chronic 01/11/2018  . Chronic back pain   . GERD (gastroesophageal reflux disease)    uses Baking Soda  . GIB (gastrointestinal bleeding) 2012  . Glaucoma    right eye  . Headache    occasionally  . Hepatitis C   . Hiatal hernia with gastroesophageal reflux disease and esophagitis 01/05/2018  . History of blood transfusion    no abnormal reaction noted  . History of gout    doesn't take any meds  . Hyperlipidemia    not on any meds  . Hypertension    takes Amlodipine and Lisinopril daily  . Insomnia    takes Ambien nightly  . Ischemic colitis ([place]) 2012  . Joint pain   . Nocturia   . Numbness    both legs occasionally  . PONV (postoperative nausea and vomiting)   . Prostate cancer ([place])   . Shortness of breath dyspnea    rarely but when notices he can be lying/sitting/exertion.Dr.Hochrein is aware per pt  . Stroke ([place]) 2016   . Urinary frequency   . Urinary urgency     Allergies  Allergen Reactions  . Penicillins Anaphylaxis    Has patient had a PCN reaction causing immediate rash, facial/tongue/throat swelling, SOB or lightheadedness with hypotension: Yes Has patient had a PCN reaction causing severe rash involving mucus membranes or skin necrosis: No Has patient had a PCN reaction that required hospitalization: No Has patient had a PCN reaction occurring within the last 10 years: No If all of the above answers are "NO", then may  proceed with Cephalosporin use..  . Sudafed [Pseudoephedrine] Other (See Comments)    Heart "races"    ROS General: Denies fever, chills, night sweats, changes in weight, changes in appetite HEENT: Denies headaches, ear pain, changes in vision, rhinorrhea, sore throat CV: Denies CP, palpitations, SOB, orthopnea Pulm: Denies SOB, cough, wheezing GI: Denies abdominal pain, nausea, vomiting, diarrhea, constipation GU: Denies dysuria, hematuria, frequency, vaginal discharge Msk: Denies muscle cramps  +joint pains Neuro: Denies weakness, numbness, tingling Skin: Denies rashes, bruising Psych: Denies depression, anxiety, hallucinations     Objective:    Blood pressure (!) 142/78, pulse 60, temperature 97.7 F (36.5 C), temperature source Oral, weight 190 lb (86.2 kg), SpO2 99 %.  Gen. Pleasant, well-nourished, in no distress, normal affect   HEENT: [place]/AT, face symmetric, PERRLA, nares patent without drainage Lungs: no accessory muscle use Cardiovascular: RRR, no peripheral edema  Wt Readings from Last 3 Encounters:  03/30/18 190 lb (86.2 kg)  03/24/18 206 lb 2 oz (93.5 kg)  03/14/18 193 lb (87.5 kg)    Lab Results  Component Value Date   WBC 4.9 03/14/2018   HGB 10.4 (L) 03/14/2018   HCT 32.8 (L) 03/14/2018   PLT 489.0 (H) 03/14/2018   GLUCOSE 88 02/01/2018   CHOL 107 02/13/2016   TRIG 128.0  02/13/2016   HDL 33.60 (L) 02/13/2016   LDLCALC 48 02/13/2016   ALT 9 01/11/2018   AST 13 (L) 01/11/2018   NA 139 02/01/2018   K 4.5 02/01/2018   CL 104 02/01/2018   CREATININE 1.10 02/01/2018   BUN 13 02/01/2018   CO2 27 02/01/2018   TSH 1.510 01/11/2018   INR 1.2 (H) 05/12/2016   HGBA1C 5.7 02/13/2016    Assessment/Plan:  Other chronic pain  -This provider spent a significant amount of time phone coordinating care for this patient. -[facility] pain [facility] called in regards to patient dismissal, however no information was given as referral was placed by patient's  previous PCP. -[facility] called by this provider in regards to pt.  Appointment made for Tuesday, September 17 at 9:45 AM with [name] for pt's hip pain. -pt should continue gabapentin, heat, stretching.   F/u prn  [name], MD

## 2018-03-30 NOTE — Patient Instructions (Addendum)
An appointment has been made for you to see [name], [name] on Tuesday, September 17 at 9:45 AM. Chronic Pain, Adult Chronic pain is a type of pain that lasts or keeps coming back (recurs) for at least six months. You may have chronic headaches, abdominal pain, or body pain. Chronic pain may be related to an illness, such as fibromyalgia or complex regional pain syndrome. Sometimes the cause of chronic pain is not known. Chronic pain can make it hard for you to do daily activities. If not treated, chronic pain can lead to other health problems, including anxiety and depression. Treatment depends on the cause and severity of your pain. You may need to work with a pain specialist to come up with a treatment plan. The plan may include medicine, counseling, and physical therapy. Many people benefit from a combination of two or more types of treatment to control their pain. Follow these instructions at home: Lifestyle  Consider keeping a pain diary to share with your health care providers.  Consider talking with a mental health care provider (psychologist) about how to cope with chronic pain.  Consider joining a chronic pain support group.  Try to control or lower your stress levels. Talk to your health care provider about strategies to do this. General instructions   Take over-the-counter and prescription medicines only as told by your health care provider.  Follow your treatment plan as told by your health care provider. This may include: ? Gentle, regular exercise. ? Eating a healthy diet that includes foods such as vegetables, fruits, fish, and lean meats. ? Cognitive or behavioral therapy. ? Working with a Community education officer. ? Meditation or yoga. ? Acupuncture or massage therapy. ? Aroma, color, light, or sound therapy. ? Local electrical stimulation. ? Shots (injections) of numbing or pain-relieving medicines into the spine or the area of pain.  Check your pain level as told by  your health care provider. Ask your health care provider if you should use a pain scale.  Learn as much as you can about how to manage your chronic pain. Ask your health care provider if an intensive pain rehabilitation program or a chronic pain specialist would be helpful.  Keep all follow-up visits as told by your health care provider. This is important. Contact a health care provider if:  Your pain gets worse.  You have new pain.  You have trouble sleeping.  You have trouble doing your normal activities.  Your pain is not controlled with treatment.  Your have side effects from pain medicine.  You feel weak. Get help right away if:  You lose feeling or have numbness in your body.  You lose control of bowel or bladder function.  Your pain suddenly gets much worse.  You develop shaking or chills.  You develop confusion.  You develop chest pain.  You have trouble breathing or shortness of breath.  You pass out.  You have thoughts about hurting yourself or others. This information is not intended to replace advice given to you by your health care provider. Make sure you discuss any questions you have with your health care provider. Document Released: 03/27/2002 Document Revised: 03/04/2016 Document Reviewed: 12/23/2015 Elsevier Interactive Patient Education  [employer].

## 2018-04-04 ENCOUNTER — Ambulatory Visit (INDEPENDENT_AMBULATORY_CARE_PROVIDER_SITE_OTHER): Payer: Medicare Other | Admitting: Orthopaedic Surgery

## 2018-04-06 ENCOUNTER — Encounter (INDEPENDENT_AMBULATORY_CARE_PROVIDER_SITE_OTHER): Payer: Self-pay | Admitting: Orthopaedic Surgery

## 2018-04-06 ENCOUNTER — Ambulatory Visit (INDEPENDENT_AMBULATORY_CARE_PROVIDER_SITE_OTHER): Payer: Medicare Other | Admitting: Orthopaedic Surgery

## 2018-04-06 ENCOUNTER — Ambulatory Visit (INDEPENDENT_AMBULATORY_CARE_PROVIDER_SITE_OTHER): Payer: Self-pay

## 2018-04-06 DIAGNOSIS — M1612 Unilateral primary osteoarthritis, left hip: Secondary | ICD-10-CM | POA: Diagnosis not present

## 2018-04-06 NOTE — Progress Notes (Signed)
Office Visit Note   Patient: [name]           Date of Birth: 12-12-1950           MRN: [id number] Visit Date: 04/06/2018              Requested by: Billie Ruddy, MD [place], [place] [address] PCP: Billie Ruddy, MD   Assessment & Plan: Visit Diagnoses:  1. Osteoarthritis of left hip, unspecified osteoarthritis type     Plan: Impression is primary localized osteoarthritis left hip.  Today, we discussed that although the patient has a significant lumbar history he also has significant hip pathology.  Because the intra-articular cortisone injection of the left hip temporarily took away all of his pain, I believe a hip replacement would be definitive treatment for this issue.  I discussed this with the patient at length.  He is asking for narcotic pain medication at today's visit.  After the initial visit with Mendel Ryder I discussed with the patient regarding treatment options and reviewed everything again.  Patient demanded to receive pain relief today.  I offered him a small supply of Norco and a cortisone injection but patient continued to demand pain relief.  He did not address his options that were offered to him and became increasingly upset that we could not fully relieve his pain immediately.  I discussed that he really needs a hip replacement to receive any meaningful pain relief but prior to this he would need a full evaluation by his primary care doctor.  He became increasingly upset and began yelling at me for reasons that are not clear.  Again I offered him a cortisone injection in the office today but the patient refused.  He then began acting in an aggressive manner towards me at which point I asked him to leave our office.  He will be discharged from our practice effective immediately.  Follow-Up Instructions: Return if symptoms worsen or fail to improve.   Orders:  Orders Placed This Encounter  Procedures  . XR Pelvis 1-2 Views   No orders  of the defined types were placed in this encounter.     Procedures: No procedures performed   Clinical Data: No additional findings.   Subjective: Chief Complaint  Patient presents with  . Left Hip - Pain    HPI patient is a 67 year old gentleman who presents to our clinic today with recurrent left hip pain.  This is been ongoing for a while now.  MRI from April 2019 showed advanced arthritic change to the left hip.  He was given an intra-articular cortisone injection which significantly helped but only lasted for 3 weeks.  At the time of the injection he was having pain not only to the groin but radiating from his buttocks down the left leg to the knee.  The injection helped all of this pain.  He comes in today asking for pain medication for the left hip.  Review of Systems as detailed in HPI.  All others reviewed and are negative.   Objective: Vital Signs: There were no vitals taken for this visit.  Physical Exam well-developed and well-nourished gentleman no acute distress.  Alert and oriented x3  Ortho Exam examination of the left hip reveals a markedly positive logroll and straight leg raise.  He is slightly weaker on the left side.  Specialty Comments:  No specialty comments available.  Imaging: No new imaging   PMFS History: Patient Active Problem  List   Diagnosis Date Noted  . Osteoarthritis of left hip 04/06/2018  . Cameron ulcer, chronic 01/11/2018  . Microcytic anemia   . Symptomatic anemia 01/10/2018  . Syncope 01/10/2018  . Hiatal hernia with gastroesophageal reflux disease and esophagitis 01/05/2018  . Cigarette nicotine dependence without complication 27/74/1287  . Insomnia 01/05/2018  . Acute pain of left shoulder 11/21/2017  . Anemia, iron deficiency   . Gastrointestinal bleeding 10/24/2015  . Absolute anemia   . Tobacco abuse   . Acute ischemic stroke ([place])   . Acute CVA (cerebrovascular accident) ([place]) 05/31/2015  . Stroke ([place]) 05/31/2015  .  Dyspnea 04/10/2014  . HTN (hypertension) 04/10/2014  . Inguinal hernia without mention of obstruction or gangrene, unilateral or unspecified, (not specified as recurrent)-right 01/30/2014   Past Medical History:  Diagnosis Date  . Arthritis   . Cameron ulcer, chronic 01/11/2018  . Chronic back pain   . GERD (gastroesophageal reflux disease)    uses Baking Soda  . GIB (gastrointestinal bleeding) 2012  . Glaucoma    right eye  . Headache    occasionally  . Hepatitis C   . Hiatal hernia with gastroesophageal reflux disease and esophagitis 01/05/2018  . History of blood transfusion    no abnormal reaction noted  . History of gout    doesn't take any meds  . Hyperlipidemia    not on any meds  . Hypertension    takes Amlodipine and Lisinopril daily  . Insomnia    takes Ambien nightly  . Ischemic colitis ([place]) 2012  . Joint pain   . Nocturia   . Numbness    both legs occasionally  . PONV (postoperative nausea and vomiting)   . Prostate cancer ([place])   . Shortness of breath dyspnea    rarely but when notices he can be lying/sitting/exertion.Dr.Hochrein is aware per pt  . Stroke ([place]) 2016   . Urinary frequency   . Urinary urgency     Family History  Problem Relation Age of Onset  . Alzheimer's disease Father   . Cancer Mother        Lymph node  . Heart failure Brother 45       Transplant 13 years ago  . CAD Brother   . Heart disease Sister 15       MI    Past Surgical History:  Procedure Laterality Date  . APPENDECTOMY    . BIOPSY  01/11/2018   Procedure: BIOPSY;  Surgeon: Gatha Mayer, MD;  Location: [facility] ENDOSCOPY;  Service: Endoscopy;;  . COLONOSCOPY    . COLONOSCOPY N/A 10/26/2015   Procedure: COLONOSCOPY;  Surgeon: Doran Stabler, MD;  Location: [facility] ENDOSCOPY;  Service: Endoscopy;  Laterality: N/A;  . ESOPHAGOGASTRODUODENOSCOPY    . ESOPHAGOGASTRODUODENOSCOPY N/A 10/26/2015   Procedure: ESOPHAGOGASTRODUODENOSCOPY (EGD);  Surgeon: Doran Stabler, MD;  Location:  [facility] ENDOSCOPY;  Service: Endoscopy;  Laterality: N/A;  . ESOPHAGOGASTRODUODENOSCOPY (EGD) WITH PROPOFOL N/A 01/11/2018   Procedure: ESOPHAGOGASTRODUODENOSCOPY (EGD) WITH PROPOFOL;  Surgeon: Gatha Mayer, MD;  Location: [facility] ENDOSCOPY;  Service: Endoscopy;  Laterality: N/A;  . INGUINAL HERNIA REPAIR Right 11/04/2014   Procedure: RIGHT INGUINAL HERNIA REPAIR WITH MESH;  Surgeon: Jackolyn Confer, MD;  Location: [facility];  Service: General;  Laterality: Right;  . MASS EXCISION N/A 11/04/2014   Procedure: REMOVAL OF RIGHT GROIN SOFT TISSUE MASS;  Surgeon: Jackolyn Confer, MD;  Location: [facility];  Service: General;  Laterality: N/A;  . Multiple abdominal surgeries  total of 13  . PROSTATECTOMY    . SMALL INTESTINE SURGERY     Social History   Occupational History  . Not on file  Tobacco Use  . Smoking status: Current Every Day Smoker    Packs/day: 0.50    Years: 50.00    Pack years: 25.00    Types: Cigarettes  . Smokeless tobacco: Never Used  Substance and Sexual Activity  . Alcohol use: Yes    Alcohol/week: 0.0 standard drinks    Comment: occasionally  . Drug use: No    Frequency: 5.0 times per week  . Sexual activity: Yes

## 2018-04-07 ENCOUNTER — Telehealth: Payer: Self-pay

## 2018-04-07 ENCOUNTER — Ambulatory Visit: Payer: Medicare Other | Admitting: Pulmonary Disease

## 2018-04-07 NOTE — Telephone Encounter (Signed)
Copied from [place] [id number]. Topic: Referral - Request >> Apr 07, 2018  4:01 PM [name] wrote: Reason for CRM:   Pt would like to request a referral for an orthopaedist for the nerves in his hips. Pt has no one in mind, but doesn't want to go to [facility] can be reached at [phone]

## 2018-04-11 ENCOUNTER — Ambulatory Visit (INDEPENDENT_AMBULATORY_CARE_PROVIDER_SITE_OTHER): Payer: Medicare Other | Admitting: Orthopaedic Surgery

## 2018-04-12 NOTE — Telephone Encounter (Signed)
Okay to refer? 

## 2018-04-12 NOTE — Telephone Encounter (Signed)
Ok

## 2018-04-13 ENCOUNTER — Other Ambulatory Visit: Payer: Self-pay | Admitting: Family Medicine

## 2018-04-13 DIAGNOSIS — M5432 Sciatica, left side: Secondary | ICD-10-CM

## 2018-04-13 NOTE — Telephone Encounter (Signed)
Referral has been placed, left a message for pt

## 2018-04-24 ENCOUNTER — Encounter: Payer: Self-pay | Admitting: Pulmonary Disease

## 2018-04-24 ENCOUNTER — Ambulatory Visit (INDEPENDENT_AMBULATORY_CARE_PROVIDER_SITE_OTHER): Payer: Medicare Other | Admitting: Pulmonary Disease

## 2018-04-24 VITALS — BP 114/70 | HR 56 | Ht 69.5 in | Wt 198.8 lb

## 2018-04-24 DIAGNOSIS — F5102 Adjustment insomnia: Secondary | ICD-10-CM | POA: Diagnosis not present

## 2018-04-24 DIAGNOSIS — F5101 Primary insomnia: Secondary | ICD-10-CM

## 2018-04-24 MED ORDER — ZOLPIDEM TARTRATE 10 MG PO TABS: 10.0000 mg | ORAL_TABLET | Freq: Every evening | ORAL | 5 refills | Status: DC | PRN

## 2018-04-24 NOTE — Progress Notes (Signed)
[facility]  Chief Complaint  Patient presents with  . Follow-up    2 month f/u for insomnia. Per patient he is still staying awake during the night. Will occasionally take a 1hr nap during the day.     Constitutional: BP 114/70 (BP Location: Left Arm, Patient Position: Sitting, Cuff Size: Normal)   Pulse (!) 56   Ht 5' 9.5" (1.765 m)   Wt 198 lb 12.8 oz (90.2 kg)   SpO2 97%   BMI 28.94 kg/m   History of Present Illness: [name] is a 67 y.o. male with insomnia.  He says he can't sleep ever.  He gets so tired and wants to sleep, but just can't.  He does somehow fall asleep for an hour or two during the day.  He tries drink beer to help him fall asleep.  He gets so tired he can't do any activity.  He asks his lady to do everything for him because he just doesn't have energy.  He doesn't feel like he is depressed.  He tried [place] before and this worked.  He didn't have any problems using ambien.  He is sure why this was stopped.  Respiratory Exam:  General - alert Eyes - pupils reactive ENT - no sinus tenderness, no stridor Cardiac - regular rate/rhythm, no murmur Chest - equal breath sounds b/l, no wheezing or rales Abdomen - soft, non tender Extremities - no cyanosis, clubbing, or edema Skin - no rashes Lymphatics - no lymphadenopathy Neuro - normal strength, uses a cane Psych - normal mood and behavior  Assessment/Plan:  Insomnia. - emphasized importance of maintaining regular sleep/wake schedule - advised him to avoid sleeping during the day - will add ambien qhs - advised him to avoid alcohol - can continue using trazodone at night also  Sleep state misperception. - will arrange for MWT to assess whether he is able to fall asleep or if he really is staying awake   There are no Patient Instructions on file for this visit.   Chesley Mires, MD [facility] 04/24/2018, 1:49 PM  Flow  Sheet  Sleep tests:  Cardiac tests: Echo 05/31/15 >> mod LVH, EF 60 to 65%, mild AR  Past Medical History: He  has a past medical history of Arthritis, Cameron ulcer, chronic (01/11/2018), Chronic back pain, GERD (gastroesophageal reflux disease), GIB (gastrointestinal bleeding) (2012), Glaucoma, Headache, Hepatitis C, Hiatal hernia with gastroesophageal reflux disease and esophagitis (01/05/2018), History of blood transfusion, History of gout, Hyperlipidemia, Hypertension, Insomnia, Ischemic colitis ([place]) (2012), Joint pain, Nocturia, Numbness, PONV (postoperative nausea and vomiting), Prostate cancer ([place]), Shortness of breath dyspnea, Stroke ([place]) (2016 ), Urinary frequency, and Urinary urgency.  Past Surgical History: He  has a past surgical history that includes Appendectomy; Small intestine surgery; Multiple abdominal surgeries; Prostatectomy; Colonoscopy; Esophagogastroduodenoscopy; Inguinal hernia repair (Right, 11/04/2014); Mass excision (N/A, 11/04/2014); Esophagogastroduodenoscopy (N/A, 10/26/2015); Colonoscopy (N/A, 10/26/2015); Esophagogastroduodenoscopy (egd) with propofol (N/A, 01/11/2018); and biopsy (01/11/2018).  Family History: His family history includes Alzheimer's disease in his father; CAD in his brother; Cancer in his mother; Heart disease (age of onset: 67) in his sister; Heart failure (age of onset: 49) in his brother.  Social History: He  reports that he has been smoking cigarettes. He has a 25.00 pack-year smoking history. He has never used smokeless tobacco. He reports that he drinks alcohol. He reports that he does not use drugs.  Medications: Allergies as of 04/24/2018      Reactions  Penicillins Anaphylaxis   Has patient had a PCN reaction causing immediate rash, facial/tongue/throat swelling, SOB or lightheadedness with hypotension: Yes Has patient had a PCN reaction causing severe rash involving mucus membranes or skin necrosis: No Has patient had a PCN reaction that  required hospitalization: No Has patient had a PCN reaction occurring within the last 10 years: No If all of the above answers are "NO", then may proceed with Cephalosporin use.[name] [pseudoephedrine] Other (See Comments)   Heart "races"      Medication List        Accurate as of 04/24/18  1:49 PM. Always use your most recent med list.          amLODipine 10 MG tablet Commonly known as:  NORVASC Take 1 tablet (10 mg total) by mouth daily.   bisacodyl 5 MG EC tablet Commonly known as:  DULCOLAX TAKE 1 TABLET BY MOUTH AS NEEDED FOR MODERATE CONSTIPATION   cloNIDine 0.1 MG tablet Commonly known as:  CATAPRES Take 1 tablet (0.1 mg total) by mouth 2 (two) times daily.   Diclofenac Sodium 3 % Gel Place 1 application onto the skin 2 (two) times daily. To affected area.   DULoxetine 30 MG capsule Commonly known as:  CYMBALTA Take 1 capsule (30 mg total) by mouth 2 (two) times daily.   ferrous sulfate 325 (65 FE) MG tablet TAKE 1 TABLET BY MOUTH THREE TWICE A DAY WITH MEALS   hydrochlorothiazide 12.5 MG capsule Commonly known as:  MICROZIDE Take 1 capsule (12.5 mg total) by mouth daily.   HYDROcodone-acetaminophen 5-325 MG tablet Commonly known as:  NORCO/VICODIN Take 1 tablet by mouth every 6 (six) hours as needed for moderate pain.   lisinopril 40 MG tablet Commonly known as:  PRINIVIL,ZESTRIL Take 1 tablet (40 mg total) by mouth daily.   pantoprazole 40 MG tablet Commonly known as:  PROTONIX TAKE 1 TABLET BY MOUTH TWICE A DAY BEFORE A MEAL   polyethylene glycol packet Commonly known as:  MIRALAX / GLYCOLAX Take 17 g by mouth daily as needed for severe constipation.   sucralfate 1 GM/10ML suspension Commonly known as:  CARAFATE Take 10 mLs (1 g total) by mouth 4 (four) times daily -  with meals and at bedtime for 15 days.   traZODone 50 MG tablet Commonly known as:  DESYREL Take 1 tablet (50 mg total) by mouth at bedtime.

## 2018-04-24 NOTE — Patient Instructions (Signed)
Ambien 10 mg pill nightly  Will schedule maintenance of wakefulness test in the sleep lab  Follow up in 2 months

## 2018-05-22 ENCOUNTER — Encounter (HOSPITAL_BASED_OUTPATIENT_CLINIC_OR_DEPARTMENT_OTHER): Payer: Medicare Other

## 2018-05-31 ENCOUNTER — Ambulatory Visit (HOSPITAL_BASED_OUTPATIENT_CLINIC_OR_DEPARTMENT_OTHER): Payer: Medicare Other | Attending: Pulmonary Disease

## 2018-06-02 ENCOUNTER — Ambulatory Visit: Payer: Medicare Other | Admitting: Podiatry

## 2018-06-09 ENCOUNTER — Ambulatory Visit: Payer: Medicare Other | Admitting: Podiatry

## 2018-06-22 ENCOUNTER — Ambulatory Visit: Payer: Medicare Other | Admitting: Podiatry

## 2018-06-24 ENCOUNTER — Emergency Department (HOSPITAL_COMMUNITY)
Admission: EM | Admit: 2018-06-24 | Discharge: 2018-06-24 | Discharge status: Against Medical Advice | Payer: Medicare Other | Attending: Emergency Medicine | Admitting: Emergency Medicine

## 2018-06-24 DIAGNOSIS — I10 Essential (primary) hypertension: Secondary | ICD-10-CM

## 2018-06-24 DIAGNOSIS — F17210 Nicotine dependence, cigarettes, uncomplicated: Secondary | ICD-10-CM | POA: Diagnosis not present

## 2018-06-24 DIAGNOSIS — T783XXA Angioneurotic edema, initial encounter: Secondary | ICD-10-CM

## 2018-06-24 DIAGNOSIS — Z79899 Other long term (current) drug therapy: Secondary | ICD-10-CM | POA: Insufficient documentation

## 2018-06-24 DIAGNOSIS — R220 Localized swelling, mass and lump, head: Secondary | ICD-10-CM

## 2018-06-24 MED ORDER — HYDROCHLOROTHIAZIDE 12.5 MG PO CAPS: 25.0000 mg | ORAL_CAPSULE | Freq: Every day | ORAL | 0 refills | Status: DC

## 2018-06-24 MED ORDER — CLONIDINE HCL 0.1 MG PO TABS: 0.3000 mg | ORAL_TABLET | Freq: Two times a day (BID) | ORAL | 0 refills | Status: DC

## 2018-06-24 NOTE — ED Triage Notes (Signed)
Pt. Stated, I drank some liquor last night and this morning my tongue is swollen and I feel really bad.  No problem swallowing or SOB

## 2018-06-24 NOTE — ED Triage Notes (Signed)
Pt stated last night around 2000 he consumed paul masson liquor and went to bed awaken this morning to tongue swollen.

## 2018-06-24 NOTE — Discharge Instructions (Addendum)
Your history and exam today were consistent with angioedema, likely from the lisinopril medication you are on.  Please stop taking the Lisinopril and get rid of these pills from your house.  After speaking with internal medicine, they recommended increasing your HCTZ to 25 mg daily (two of your 12.5mg  pills) and increase the clonidine to 0.3mg  twice a day (three pills twice a day).  You are leaving [facility] as we are concerned that your tongue swelling could worsen and ultimately to death.  You understand this risk.  Please follow-up with your primary doctor in the next several days.  If any symptoms change or worsen whatsoever, please return to the nearest emergency department.

## 2018-06-24 NOTE — ED Provider Notes (Signed)
[facility] EMERGENCY DEPARTMENT Provider Note   CSN: [id number] Arrival date & time: 06/24/18  1141     History   Chief Complaint Chief Complaint  Patient presents with  . Oral Swelling    HPI [name] is a 67 y.o. male.  The history is provided by the patient and medical records. No language interpreter was used.  Allergic Reaction  Presenting symptoms: difficulty breathing (when laying back) and swelling   Presenting symptoms: no difficulty swallowing ( ), no rash and no wheezing   Severity:  Moderate Duration:  1 day Prior allergic episodes:  Allergies to medications Context comment:  On lisinopril Relieved by:  Nothing Worsened by:  Nothing Ineffective treatments:  None tried   Past Medical History:  Diagnosis Date  . Arthritis   . Cameron ulcer, chronic 01/11/2018  . Chronic back pain   . GERD (gastroesophageal reflux disease)    uses Baking Soda  . GIB (gastrointestinal bleeding) 2012  . Glaucoma    right eye  . Headache    occasionally  . Hepatitis C   . Hiatal hernia with gastroesophageal reflux disease and esophagitis 01/05/2018  . History of blood transfusion    no abnormal reaction noted  . History of gout    doesn't take any meds  . Hyperlipidemia    not on any meds  . Hypertension    takes Amlodipine and Lisinopril daily  . Insomnia    takes Ambien nightly  . Ischemic colitis ([place]) 2012  . Joint pain   . Nocturia   . Numbness    both legs occasionally  . PONV (postoperative nausea and vomiting)   . Prostate cancer ([place])   . Shortness of breath dyspnea    rarely but when notices he can be lying/sitting/exertion.Dr.Hochrein is aware per pt  . Stroke ([place]) 2016   . Urinary frequency   . Urinary urgency     Patient Active Problem List   Diagnosis Date Noted  . Osteoarthritis of left hip 04/06/2018  . Cameron ulcer, chronic 01/11/2018  . Microcytic anemia   . Symptomatic anemia 01/10/2018  . Syncope  01/10/2018  . Hiatal hernia with gastroesophageal reflux disease and esophagitis 01/05/2018  . Cigarette nicotine dependence without complication 40/98/1191  . Insomnia 01/05/2018  . Acute pain of left shoulder 11/21/2017  . Anemia, iron deficiency   . Gastrointestinal bleeding 10/24/2015  . Absolute anemia   . Tobacco abuse   . Acute ischemic stroke ([place])   . Acute CVA (cerebrovascular accident) ([place]) 05/31/2015  . Stroke ([place]) 05/31/2015  . Dyspnea 04/10/2014  . HTN (hypertension) 04/10/2014  . Inguinal hernia without mention of obstruction or gangrene, unilateral or unspecified, (not specified as recurrent)-right 01/30/2014    Past Surgical History:  Procedure Laterality Date  . APPENDECTOMY    . BIOPSY  01/11/2018   Procedure: BIOPSY;  Surgeon: Gatha Mayer, MD;  Location: [facility] ENDOSCOPY;  Service: Endoscopy;;  . COLONOSCOPY    . COLONOSCOPY N/A 10/26/2015   Procedure: COLONOSCOPY;  Surgeon: Doran Stabler, MD;  Location: [facility] ENDOSCOPY;  Service: Endoscopy;  Laterality: N/A;  . ESOPHAGOGASTRODUODENOSCOPY    . ESOPHAGOGASTRODUODENOSCOPY N/A 10/26/2015   Procedure: ESOPHAGOGASTRODUODENOSCOPY (EGD);  Surgeon: Doran Stabler, MD;  Location: [facility] ENDOSCOPY;  Service: Endoscopy;  Laterality: N/A;  . ESOPHAGOGASTRODUODENOSCOPY (EGD) WITH PROPOFOL N/A 01/11/2018   Procedure: ESOPHAGOGASTRODUODENOSCOPY (EGD) WITH PROPOFOL;  Surgeon: Gatha Mayer, MD;  Location: [facility] ENDOSCOPY;  Service: Endoscopy;  Laterality: N/A;  .  INGUINAL HERNIA REPAIR Right 11/04/2014   Procedure: RIGHT INGUINAL HERNIA REPAIR WITH MESH;  Surgeon: Jackolyn Confer, MD;  Location: [facility];  Service: General;  Laterality: Right;  . MASS EXCISION N/A 11/04/2014   Procedure: REMOVAL OF RIGHT GROIN SOFT TISSUE MASS;  Surgeon: Jackolyn Confer, MD;  Location: [facility];  Service: General;  Laterality: N/A;  . Multiple abdominal surgeries     total of 13  . PROSTATECTOMY    . SMALL INTESTINE SURGERY          Home  Medications    Prior to Admission medications   Medication Sig Start Date End Date Taking? Authorizing Provider  amLODipine (NORVASC) 10 MG tablet Take 1 tablet (10 mg total) by mouth daily. 10/18/17   [name], MD  bisacodyl (BISACODYL) 5 MG EC tablet TAKE 1 TABLET BY MOUTH AS NEEDED FOR MODERATE CONSTIPATION 03/30/18   Billie Ruddy, MD  cloNIDine (CATAPRES) 0.1 MG tablet Take 1 tablet (0.1 mg total) by mouth 2 (two) times daily. 01/13/18   Amin, Jeanella Flattery, MD  Diclofenac Sodium 3 % GEL Place 1 application onto the skin 2 (two) times daily. To affected area. 07/26/17   Waynetta Pean, PA-C  DULoxetine (CYMBALTA) 30 MG capsule Take 1 capsule (30 mg total) by mouth 2 (two) times daily. 10/18/17   [name], MD  ferrous sulfate 325 (65 FE) MG tablet TAKE 1 TABLET BY MOUTH THREE TWICE A DAY WITH MEALS 03/30/18   Billie Ruddy, MD  hydrochlorothiazide (MICROZIDE) 12.5 MG capsule Take 1 capsule (12.5 mg total) by mouth daily. 10/18/17   [name], MD  HYDROcodone-acetaminophen (NORCO/VICODIN) 5-325 MG tablet Take 1 tablet by mouth every 6 (six) hours as needed for moderate pain. 12/01/17   [name], MD  lisinopril (PRINIVIL,ZESTRIL) 40 MG tablet Take 1 tablet (40 mg total) by mouth daily. 10/18/17   [name], MD  pantoprazole (PROTONIX) 40 MG tablet TAKE 1 TABLET BY MOUTH TWICE A DAY BEFORE A MEAL 02/27/18   Billie Ruddy, MD  polyethylene glycol (MIRALAX / GLYCOLAX) packet Take 17 g by mouth daily as needed for severe constipation. 01/13/18   Amin, Jeanella Flattery, MD  sucralfate (CARAFATE) 1 GM/10ML suspension Take 10 mLs (1 g total) by mouth 4 (four) times daily -  with meals and at bedtime for 15 days. 01/13/18 01/28/18  Amin, Jeanella Flattery, MD  traZODone (DESYREL) 50 MG tablet Take 1 tablet (50 mg total) by mouth at bedtime. 03/30/18   Billie Ruddy, MD  zolpidem (AMBIEN) 10 MG tablet Take 1 tablet (10 mg total) by mouth at bedtime as needed for sleep. 04/24/18  05/24/18  Chesley Mires, MD    Family History Family History  Problem Relation Age of Onset  . Alzheimer's disease Father   . Cancer Mother        Lymph node  . Heart failure Brother 45       Transplant 13 years ago  . CAD Brother   . Heart disease Sister 67       MI    Social History Social History   Tobacco Use  . Smoking status: Current Every Day Smoker    Packs/day: 0.50    Years: 50.00    Pack years: 25.00    Types: Cigarettes  . Smokeless tobacco: Never Used  Substance Use Topics  . Alcohol use: Yes    Alcohol/week: 0.0 standard drinks    Comment: occasionally  . Drug  use: No    Frequency: 5.0 times per week     Allergies   Penicillins and Sudafed [pseudoephedrine]   Review of Systems Review of Systems  Constitutional: Negative for chills, fatigue and fever.  HENT: Positive for facial swelling (tongue). Negative for congestion, dental problem, drooling, rhinorrhea, sore throat and trouble swallowing ( ).   Eyes: Negative for visual disturbance.  Respiratory: Negative for cough, chest tightness, shortness of breath, wheezing and stridor.   Cardiovascular: Negative for chest pain.  Gastrointestinal: Negative for abdominal pain, constipation, diarrhea and vomiting.  Genitourinary: Negative for dysuria and flank pain.  Musculoskeletal: Negative for back pain, neck pain and neck stiffness.  Skin: Negative for rash.  Neurological: Negative for light-headedness.  Psychiatric/Behavioral: Negative for agitation.  All other systems reviewed and are negative.    Physical Exam Updated Vital Signs BP (!) 187/63   Pulse (!) 54   Temp 97.6 F (36.4 C) (Oral)   Ht 5\' 9"  (1.753 m)   Wt 89.8 kg   SpO2 99%   BMI 29.24 kg/m   Physical Exam  Constitutional: He appears well-developed and well-nourished. No distress.  HENT:  Head: Atraumatic.  Right Ear: External ear normal.  Left Ear: External ear normal.  Nose: Nose normal.  Mouth/Throat: Uvula is midline,  oropharynx is clear and moist and mucous membranes are normal. No trismus in the jaw. Abnormal dentition. No uvula swelling or lacerations. No oropharyngeal exudate, posterior oropharyngeal edema or posterior oropharyngeal erythema.    Tongue edematous and swollen.  No lip swelling.  No stridor.  Normal neck range of motion.  No tenderness.  No erythema.  No lacerations.  Eyes: Conjunctivae and EOM are normal.  Neck: Normal range of motion. Neck supple.  Cardiovascular: Normal rate and regular rhythm.  No murmur heard. Pulmonary/Chest: Effort normal and breath sounds normal. No respiratory distress. He has no wheezes. He has no rales. He exhibits no tenderness.  Abdominal: Soft. There is no tenderness.  Musculoskeletal: He exhibits no edema.  Neurological: He is alert. No sensory deficit. He exhibits normal muscle tone.  Skin: Skin is warm and dry. He is not diaphoretic. No erythema. No pallor.  Psychiatric: He has a normal mood and affect.  Nursing note and vitals reviewed.    ED Treatments / Results  Labs (all labs ordered are listed, but only abnormal results are displayed) Labs Reviewed - No data to display  EKG None  Radiology No results found.  Procedures Procedures (including critical care time)  Medications Ordered in ED Medications - No data to display   Initial Impression / Assessment and Plan / ED Course  I have reviewed the triage vital signs and the nursing notes.  Pertinent labs & imaging results that were available during my care of the patient were reviewed by me and considered in my medical decision making (see chart for details).     Manjot Hinks is a 67 y.o. male with a past medical history significant for prior stroke, hypertension, hepatitis C, gout, GERD, and chronic pain who presents with tongue swelling.  Patient reports that this morning he woke up around 4 AM with his tongue being swollen.  He reports that he has some difficulty breathing  when he lays flat due to the tongue falling backwards however he reports that sitting up he has no difficulty breathing or swallowing.  He has never had this before.  He denies any history of angioedema or difficult allergic reactions.  He reports that his  tongue does not seem to go swollen more throughout the day and he has no other complaints.  He denies any other new medications.  He does report that he drank alcohol last night but does not think this caused his episode.  He denies any sensation of throat swelling or any throat pain.  No chest pain or palpitations.  On exam, patient has swelling of his tongue diffusely.  No trismus.  Uvula does not appear edematous.  No stridor.  Normal neck range of motion.  No evidence of Ludwig angina.  No lymph nodes palpable.  Lungs clear and chest is nontender.  Exam otherwise unremarkable.  Patient advised to due to his lisinopril use, this is likely angioedema.  As he reports difficulty breathing when he lays flat due to his tongue being swollen, we recommended admission for monitoring and further management.  Patient says that he does not want to be admitted and will leave [place].  Patient was able to eat and drink without difficulty.  Patient had no change in several hours of monitoring.  Patient will have his clonidine increased and his HCTZ increased to help with blood pressure management as we are taking out the lisinopril.  He was instructed to stop taking it and get rid of the medications.  Patient will follow-up with his PCP and understood extremely strict return precautions.  Patient left AGAINST MEDICAL ADVICE and otherwise stable condition at this time.    Final Clinical Impressions(s) / ED Diagnoses   Final diagnoses:  Angioedema, initial encounter  Tongue swelling    ED Discharge Orders         Ordered    cloNIDine (CATAPRES) 0.1 MG tablet  2 times daily     06/24/18 1421    hydrochlorothiazide (MICROZIDE) 12.5 MG  capsule  Daily     06/24/18 1421          Clinical Impression: 1. Angioedema, initial encounter   2. Tongue swelling     Disposition: Admit  This note was prepared with assistance of Dragon voice recognition software. Occasional wrong-word or sound-a-like substitutions may have occurred due to the inherent limitations of voice recognition software.     Nikiesha Milford, Gwenyth Allegra, MD 06/24/18 [phone]

## 2018-06-26 ENCOUNTER — Ambulatory Visit: Payer: Medicare Other | Admitting: Pulmonary Disease

## 2018-07-07 ENCOUNTER — Ambulatory Visit: Payer: Medicare Other | Admitting: Pulmonary Disease

## 2018-07-20 ENCOUNTER — Emergency Department (HOSPITAL_COMMUNITY): Payer: Medicare Other

## 2018-07-20 ENCOUNTER — Encounter (HOSPITAL_COMMUNITY): Payer: Self-pay | Admitting: Emergency Medicine

## 2018-07-20 ENCOUNTER — Inpatient Hospital Stay (HOSPITAL_COMMUNITY)
Admission: EM | Admit: 2018-07-20 | Discharge: 2018-07-26 | DRG: 064 | Disposition: A | Discharge status: Rehabilitation Facility | Payer: Medicare Other | Attending: Neurology | Admitting: Neurology

## 2018-07-20 ENCOUNTER — Other Ambulatory Visit: Payer: Self-pay

## 2018-07-20 DIAGNOSIS — I6389 Other cerebral infarction: Secondary | ICD-10-CM | POA: Diagnosis not present

## 2018-07-20 DIAGNOSIS — D649 Anemia, unspecified: Secondary | ICD-10-CM | POA: Diagnosis not present

## 2018-07-20 DIAGNOSIS — F5104 Psychophysiologic insomnia: Secondary | ICD-10-CM | POA: Diagnosis present

## 2018-07-20 DIAGNOSIS — G92 Toxic encephalopathy: Secondary | ICD-10-CM | POA: Diagnosis present

## 2018-07-20 DIAGNOSIS — G8929 Other chronic pain: Secondary | ICD-10-CM | POA: Diagnosis present

## 2018-07-20 DIAGNOSIS — K2970 Gastritis, unspecified, without bleeding: Secondary | ICD-10-CM | POA: Diagnosis not present

## 2018-07-20 DIAGNOSIS — Z888 Allergy status to other drugs, medicaments and biological substances status: Secondary | ICD-10-CM

## 2018-07-20 DIAGNOSIS — I610 Nontraumatic intracerebral hemorrhage in hemisphere, subcortical: Secondary | ICD-10-CM | POA: Diagnosis present

## 2018-07-20 DIAGNOSIS — Z720 Tobacco use: Secondary | ICD-10-CM | POA: Diagnosis present

## 2018-07-20 DIAGNOSIS — R29810 Facial weakness: Secondary | ICD-10-CM | POA: Diagnosis not present

## 2018-07-20 DIAGNOSIS — F1210 Cannabis abuse, uncomplicated: Secondary | ICD-10-CM | POA: Diagnosis not present

## 2018-07-20 DIAGNOSIS — M109 Gout, unspecified: Secondary | ICD-10-CM | POA: Diagnosis present

## 2018-07-20 DIAGNOSIS — I10 Essential (primary) hypertension: Secondary | ICD-10-CM | POA: Diagnosis present

## 2018-07-20 DIAGNOSIS — E669 Obesity, unspecified: Secondary | ICD-10-CM | POA: Diagnosis present

## 2018-07-20 DIAGNOSIS — F17200 Nicotine dependence, unspecified, uncomplicated: Secondary | ICD-10-CM | POA: Diagnosis not present

## 2018-07-20 DIAGNOSIS — G936 Cerebral edema: Secondary | ICD-10-CM | POA: Diagnosis present

## 2018-07-20 DIAGNOSIS — B1920 Unspecified viral hepatitis C without hepatic coma: Secondary | ICD-10-CM | POA: Diagnosis present

## 2018-07-20 DIAGNOSIS — R471 Dysarthria and anarthria: Secondary | ICD-10-CM | POA: Diagnosis present

## 2018-07-20 DIAGNOSIS — Z7982 Long term (current) use of aspirin: Secondary | ICD-10-CM

## 2018-07-20 DIAGNOSIS — D72829 Elevated white blood cell count, unspecified: Secondary | ICD-10-CM | POA: Diagnosis present

## 2018-07-20 DIAGNOSIS — D509 Iron deficiency anemia, unspecified: Secondary | ICD-10-CM | POA: Diagnosis present

## 2018-07-20 DIAGNOSIS — Z880 Allergy status to penicillin: Secondary | ICD-10-CM

## 2018-07-20 DIAGNOSIS — I161 Hypertensive emergency: Secondary | ICD-10-CM | POA: Diagnosis present

## 2018-07-20 DIAGNOSIS — K210 Gastro-esophageal reflux disease with esophagitis: Secondary | ICD-10-CM | POA: Diagnosis not present

## 2018-07-20 DIAGNOSIS — R451 Restlessness and agitation: Secondary | ICD-10-CM | POA: Diagnosis present

## 2018-07-20 DIAGNOSIS — E785 Hyperlipidemia, unspecified: Secondary | ICD-10-CM | POA: Diagnosis present

## 2018-07-20 DIAGNOSIS — Z9079 Acquired absence of other genital organ(s): Secondary | ICD-10-CM

## 2018-07-20 DIAGNOSIS — K2951 Unspecified chronic gastritis with bleeding: Secondary | ICD-10-CM | POA: Diagnosis not present

## 2018-07-20 DIAGNOSIS — Z8546 Personal history of malignant neoplasm of prostate: Secondary | ICD-10-CM | POA: Diagnosis not present

## 2018-07-20 DIAGNOSIS — R4781 Slurred speech: Secondary | ICD-10-CM | POA: Diagnosis present

## 2018-07-20 DIAGNOSIS — I619 Nontraumatic intracerebral hemorrhage, unspecified: Secondary | ICD-10-CM | POA: Diagnosis present

## 2018-07-20 DIAGNOSIS — I639 Cerebral infarction, unspecified: Secondary | ICD-10-CM | POA: Diagnosis not present

## 2018-07-20 DIAGNOSIS — K449 Diaphragmatic hernia without obstruction or gangrene: Secondary | ICD-10-CM | POA: Diagnosis not present

## 2018-07-20 DIAGNOSIS — I69354 Hemiplegia and hemiparesis following cerebral infarction affecting left non-dominant side: Secondary | ICD-10-CM

## 2018-07-20 DIAGNOSIS — D500 Iron deficiency anemia secondary to blood loss (chronic): Secondary | ICD-10-CM | POA: Diagnosis not present

## 2018-07-20 DIAGNOSIS — I69319 Unspecified symptoms and signs involving cognitive functions following cerebral infarction: Secondary | ICD-10-CM | POA: Diagnosis not present

## 2018-07-20 DIAGNOSIS — F1290 Cannabis use, unspecified, uncomplicated: Secondary | ICD-10-CM | POA: Diagnosis present

## 2018-07-20 DIAGNOSIS — Z9049 Acquired absence of other specified parts of digestive tract: Secondary | ICD-10-CM

## 2018-07-20 DIAGNOSIS — R269 Unspecified abnormalities of gait and mobility: Secondary | ICD-10-CM | POA: Diagnosis not present

## 2018-07-20 DIAGNOSIS — Z6829 Body mass index (BMI) 29.0-29.9, adult: Secondary | ICD-10-CM | POA: Diagnosis not present

## 2018-07-20 DIAGNOSIS — M25512 Pain in left shoulder: Secondary | ICD-10-CM | POA: Diagnosis not present

## 2018-07-20 DIAGNOSIS — E876 Hypokalemia: Secondary | ICD-10-CM | POA: Diagnosis present

## 2018-07-20 DIAGNOSIS — I69118 Other symptoms and signs involving cognitive functions following nontraumatic intracerebral hemorrhage: Secondary | ICD-10-CM | POA: Diagnosis not present

## 2018-07-20 DIAGNOSIS — H409 Unspecified glaucoma: Secondary | ICD-10-CM | POA: Diagnosis present

## 2018-07-20 DIAGNOSIS — I69398 Other sequelae of cerebral infarction: Secondary | ICD-10-CM | POA: Diagnosis not present

## 2018-07-20 DIAGNOSIS — G4700 Insomnia, unspecified: Secondary | ICD-10-CM | POA: Diagnosis present

## 2018-07-20 DIAGNOSIS — R52 Pain, unspecified: Secondary | ICD-10-CM | POA: Diagnosis not present

## 2018-07-20 DIAGNOSIS — Z807 Family history of other malignant neoplasms of lymphoid, hematopoietic and related tissues: Secondary | ICD-10-CM

## 2018-07-20 DIAGNOSIS — Z8249 Family history of ischemic heart disease and other diseases of the circulatory system: Secondary | ICD-10-CM | POA: Diagnosis not present

## 2018-07-20 DIAGNOSIS — M75102 Unspecified rotator cuff tear or rupture of left shoulder, not specified as traumatic: Secondary | ICD-10-CM | POA: Diagnosis not present

## 2018-07-20 DIAGNOSIS — F17210 Nicotine dependence, cigarettes, uncomplicated: Secondary | ICD-10-CM | POA: Diagnosis present

## 2018-07-20 DIAGNOSIS — K257 Chronic gastric ulcer without hemorrhage or perforation: Secondary | ICD-10-CM | POA: Diagnosis not present

## 2018-07-20 DIAGNOSIS — Z8673 Personal history of transient ischemic attack (TIA), and cerebral infarction without residual deficits: Secondary | ICD-10-CM | POA: Diagnosis not present

## 2018-07-20 DIAGNOSIS — I69112 Visuospatial deficit and spatial neglect following nontraumatic intracerebral hemorrhage: Secondary | ICD-10-CM | POA: Diagnosis not present

## 2018-07-20 DIAGNOSIS — Z820 Family history of epilepsy and other diseases of the nervous system: Secondary | ICD-10-CM | POA: Diagnosis not present

## 2018-07-20 DIAGNOSIS — I69151 Hemiplegia and hemiparesis following nontraumatic intracerebral hemorrhage affecting right dominant side: Secondary | ICD-10-CM | POA: Diagnosis present

## 2018-07-20 DIAGNOSIS — D62 Acute posthemorrhagic anemia: Secondary | ICD-10-CM | POA: Diagnosis present

## 2018-07-20 DIAGNOSIS — M549 Dorsalgia, unspecified: Secondary | ICD-10-CM | POA: Diagnosis present

## 2018-07-20 LAB — COMPREHENSIVE METABOLIC PANEL
ALT: 17 U/L (ref 0–44)
AST: 24 U/L (ref 15–41)
Albumin: 4 g/dL (ref 3.5–5.0)
Alkaline Phosphatase: 94 U/L (ref 38–126)
Anion gap: 11 (ref 5–15)
BUN: 15 mg/dL (ref 8–23)
CO2: 23 mmol/L (ref 22–32)
Calcium: 9 mg/dL (ref 8.9–10.3)
Chloride: 110 mmol/L (ref 98–111)
Creatinine, Ser: 1.03 mg/dL (ref 0.61–1.24)
GFR calc Af Amer: >60 mL/min (ref >60)
GFR calc non Af Amer: >60 mL/min (ref >60)
Glucose, Bld: 116 mg/dL — ABNORMAL HIGH (ref 70–99)
Potassium: 3.6 mmol/L (ref 3.5–5.1)
Sodium: 144 mmol/L (ref 135–145)
Total Bilirubin: <0.1 mg/dL — ABNORMAL LOW (ref 0.3–1.2)
Total Protein: 7.1 g/dL (ref 6.5–8.1)

## 2018-07-20 LAB — CBC
HCT: 32.7 % — ABNORMAL LOW (ref 39.0–52.0)
Hemoglobin: 9 g/dL — ABNORMAL LOW (ref 13.0–17.0)
MCH: 22.3 pg — ABNORMAL LOW (ref 26.0–34.0)
MCHC: 27.5 g/dL — ABNORMAL LOW (ref 30.0–36.0)
MCV: 80.9 fL (ref 80.0–100.0)
Platelets: 421 10*3/uL — ABNORMAL HIGH (ref 150–400)
RBC: 4.04 MIL/uL — ABNORMAL LOW (ref 4.22–5.81)
RDW: 17.9 % — ABNORMAL HIGH (ref 11.5–15.5)
WBC: 6.8 10*3/uL (ref 4.0–10.5)
nRBC: 0 % (ref 0.0–0.2)

## 2018-07-20 LAB — DIFFERENTIAL
Abs Immature Granulocytes: 0.11 10*3/uL — ABNORMAL HIGH (ref 0.00–0.07)
Basophils Absolute: 0.1 10*3/uL (ref 0.0–0.1)
Basophils Relative: 1 %
Eosinophils Absolute: 0.1 10*3/uL (ref 0.0–0.5)
Eosinophils Relative: 1 %
Immature Granulocytes: 2 %
Lymphocytes Relative: 21 %
Lymphs Abs: 1.4 10*3/uL (ref 0.7–4.0)
Monocytes Absolute: 0.5 10*3/uL (ref 0.1–1.0)
Monocytes Relative: 8 %
Neutro Abs: 4.5 10*3/uL (ref 1.7–7.7)
Neutrophils Relative %: 67 %

## 2018-07-20 LAB — I-STAT CHEM 8, ED
BUN: 16 mg/dL (ref 8–23)
Calcium, Ion: 1.21 mmol/L (ref 1.15–1.40)
Chloride: 111 mmol/L (ref 98–111)
Creatinine, Ser: 1 mg/dL (ref 0.61–1.24)
Glucose, Bld: 114 mg/dL — ABNORMAL HIGH (ref 70–99)
HCT: 32 % — ABNORMAL LOW (ref 39.0–52.0)
Hemoglobin: 10.9 g/dL — ABNORMAL LOW (ref 13.0–17.0)
Potassium: 3.6 mmol/L (ref 3.5–5.1)
Sodium: 146 mmol/L — ABNORMAL HIGH (ref 135–145)
TCO2: 25 mmol/L (ref 22–32)

## 2018-07-20 LAB — ETHANOL: Alcohol, Ethyl (B): <10 mg/dL (ref <10)

## 2018-07-20 LAB — MRSA PCR SCREENING: MRSA by PCR: NEGATIVE

## 2018-07-20 LAB — RAPID URINE DRUG SCREEN, HOSP PERFORMED
Amphetamines: NOT DETECTED
Barbiturates: NOT DETECTED
Benzodiazepines: NOT DETECTED
Cocaine: NOT DETECTED
Opiates: NOT DETECTED
Tetrahydrocannabinol: POSITIVE — AB

## 2018-07-20 LAB — PROTIME-INR
INR: 1.23
Prothrombin Time: 15.4 seconds — ABNORMAL HIGH (ref 11.4–15.2)

## 2018-07-20 LAB — I-STAT TROPONIN, ED: Troponin i, poc: 0.02 ng/mL (ref 0.00–0.08)

## 2018-07-20 LAB — APTT: aPTT: 39 seconds — ABNORMAL HIGH (ref 24–36)

## 2018-07-20 MED ORDER — ACETAMINOPHEN 650 MG RE SUPP: 650.0000 mg | RECTAL | Status: DC | PRN

## 2018-07-20 MED ORDER — LABETALOL HCL 5 MG/ML IV SOLN
INTRAVENOUS | Status: AC
  Filled 2018-07-20: qty 4

## 2018-07-20 MED ORDER — SENNOSIDES-DOCUSATE SODIUM 8.6-50 MG PO TABS
1.0000 | ORAL_TABLET | Freq: Two times a day (BID) | ORAL | Status: DC
  Administered 2018-07-21 – 2018-07-26 (×7): 1 via ORAL
  Filled 2018-07-20 (×7): qty 1

## 2018-07-20 MED ORDER — LORAZEPAM 2 MG/ML IJ SOLN
INTRAMUSCULAR | Status: AC
  Administered 2018-07-20: 1 mg
  Filled 2018-07-20: qty 1

## 2018-07-20 MED ORDER — LORAZEPAM 2 MG/ML IJ SOLN
INTRAMUSCULAR | Status: AC
  Filled 2018-07-20: qty 1

## 2018-07-20 MED ORDER — STROKE: EARLY STAGES OF RECOVERY BOOK
Freq: Once | Status: DC
  Filled 2018-07-20: qty 1

## 2018-07-20 MED ORDER — HALOPERIDOL LACTATE 5 MG/ML IJ SOLN
2.0000 mg | Freq: Four times a day (QID) | INTRAMUSCULAR | Status: DC | PRN
  Administered 2018-07-20 – 2018-07-23 (×3): 2 mg via INTRAMUSCULAR
  Filled 2018-07-20 (×3): qty 1

## 2018-07-20 MED ORDER — PANTOPRAZOLE SODIUM 40 MG IV SOLR
40.0000 mg | Freq: Every day | INTRAVENOUS | Status: DC
  Administered 2018-07-20 – 2018-07-22 (×3): 40 mg via INTRAVENOUS
  Filled 2018-07-20 (×3): qty 40

## 2018-07-20 MED ORDER — LABETALOL HCL 5 MG/ML IV SOLN
10.0000 mg | Freq: Once | INTRAVENOUS | Status: AC
  Administered 2018-07-20: 10 mg via INTRAVENOUS

## 2018-07-20 MED ORDER — LORAZEPAM 2 MG/ML IJ SOLN
1.0000 mg | Freq: Once | INTRAMUSCULAR | Status: AC
  Administered 2018-07-20: 1 mg via INTRAVENOUS

## 2018-07-20 MED ORDER — CLEVIDIPINE BUTYRATE 0.5 MG/ML IV EMUL
0.0000 mg/h | INTRAVENOUS | Status: DC
  Administered 2018-07-20 (×3): 21 mg/h via INTRAVENOUS
  Administered 2018-07-20: 32 mg/h via INTRAVENOUS
  Administered 2018-07-20: 8 mg/h via INTRAVENOUS
  Administered 2018-07-21: 21 mg/h via INTRAVENOUS
  Administered 2018-07-21 (×2): 12 mg/h via INTRAVENOUS
  Administered 2018-07-21 (×3): 21 mg/h via INTRAVENOUS
  Administered 2018-07-22: 15 mg/h via INTRAVENOUS
  Administered 2018-07-22: 10 mg/h via INTRAVENOUS
  Administered 2018-07-22: 2 mg/h via INTRAVENOUS
  Administered 2018-07-22: 1 mg/h via INTRAVENOUS
  Administered 2018-07-23: 21 mg/h via INTRAVENOUS
  Administered 2018-07-23: 17 mg/h via INTRAVENOUS
  Administered 2018-07-23: 16 mg/h via INTRAVENOUS
  Administered 2018-07-23: 19 mg/h via INTRAVENOUS
  Administered 2018-07-23: 21 mg/h via INTRAVENOUS
  Administered 2018-07-23: 9 mg/h via INTRAVENOUS
  Filled 2018-07-20 (×9): qty 50
  Filled 2018-07-20: qty 100
  Filled 2018-07-20 (×8): qty 50
  Filled 2018-07-20: qty 100

## 2018-07-20 MED ORDER — LABETALOL HCL 5 MG/ML IV SOLN: 10.0000 mg | Freq: Once | INTRAVENOUS | Status: DC

## 2018-07-20 MED ORDER — HYDRALAZINE HCL 20 MG/ML IJ SOLN
10.0000 mg | Freq: Once | INTRAMUSCULAR | Status: AC
  Administered 2018-07-20: 10 mg via INTRAVENOUS
  Filled 2018-07-20: qty 1

## 2018-07-20 MED ORDER — ACETAMINOPHEN 160 MG/5ML PO SOLN: 650.0000 mg | ORAL | Status: DC | PRN

## 2018-07-20 MED ORDER — LORAZEPAM 2 MG/ML IJ SOLN
1.0000 mg | Freq: Four times a day (QID) | INTRAMUSCULAR | Status: AC
  Administered 2018-07-21 – 2018-07-23 (×8): 1 mg via INTRAVENOUS
  Filled 2018-07-20 (×8): qty 1

## 2018-07-20 MED ORDER — LABETALOL HCL 5 MG/ML IV SOLN
20.0000 mg | Freq: Once | INTRAVENOUS | Status: AC
  Administered 2018-07-20: 20 mg via INTRAVENOUS
  Filled 2018-07-20: qty 4

## 2018-07-20 MED ORDER — LORAZEPAM 2 MG/ML IJ SOLN
0.5000 mg | Freq: Once | INTRAMUSCULAR | Status: AC
  Administered 2018-07-20: 0.5 mg via INTRAVENOUS
  Filled 2018-07-20: qty 1

## 2018-07-20 MED ORDER — ACETAMINOPHEN 325 MG PO TABS: 650.0000 mg | ORAL_TABLET | ORAL | Status: DC | PRN

## 2018-07-20 MED ORDER — CLEVIDIPINE BUTYRATE 0.5 MG/ML IV EMUL
0.0000 mg/h | INTRAVENOUS | Status: DC
  Administered 2018-07-20: 1 mg/h via INTRAVENOUS
  Filled 2018-07-20 (×2): qty 50

## 2018-07-20 MED ORDER — LORAZEPAM 2 MG/ML IJ SOLN
2.0000 mg | Freq: Four times a day (QID) | INTRAMUSCULAR | Status: AC
  Administered 2018-07-20 – 2018-07-21 (×2): 2 mg via INTRAVENOUS
  Filled 2018-07-20: qty 1

## 2018-07-20 MED ORDER — HALOPERIDOL LACTATE 5 MG/ML IJ SOLN
INTRAMUSCULAR | Status: AC
  Filled 2018-07-20: qty 1

## 2018-07-20 MED ORDER — DEXMEDETOMIDINE HCL IN NACL 400 MCG/100ML IV SOLN
0.4000 ug/kg/h | INTRAVENOUS | Status: DC
  Administered 2018-07-20: 0.4 ug/kg/h via INTRAVENOUS
  Administered 2018-07-21: 0.9 ug/kg/h via INTRAVENOUS
  Administered 2018-07-21: 0.7 ug/kg/h via INTRAVENOUS
  Administered 2018-07-21: 0.9 ug/kg/h via INTRAVENOUS
  Administered 2018-07-21: 1 ug/kg/h via INTRAVENOUS
  Administered 2018-07-21: 0.7 ug/kg/h via INTRAVENOUS
  Administered 2018-07-22: 0.5 ug/kg/h via INTRAVENOUS
  Administered 2018-07-23: 0.8 ug/kg/h via INTRAVENOUS
  Administered 2018-07-23: 0.4 ug/kg/h via INTRAVENOUS
  Filled 2018-07-20: qty 100
  Filled 2018-07-20: qty 200
  Filled 2018-07-20 (×6): qty 100

## 2018-07-20 NOTE — Progress Notes (Signed)
Called for agitation by RN. Most likely in EtOH withdrawal. Scheduled Ativan ordered for 1 day a 2 mg q6h then 2 days at 1 mg q6h. Also on CIWA protocol.  Electronically signed: Dr. Kerney Elbe

## 2018-07-20 NOTE — ED Notes (Addendum)
Pt very agitated and moving in bed. Pt very diaphoretic.

## 2018-07-20 NOTE — Plan of Care (Signed)
  Problem: Elimination: Goal: Will not experience complications related to bowel motility Outcome: Progressing   

## 2018-07-20 NOTE — ED Notes (Signed)
Systolic 129

## 2018-07-20 NOTE — ED Provider Notes (Signed)
[facility] EMERGENCY DEPARTMENT Provider Note   CSN: [id number] Arrival date & time: 07/20/18  1604     History   Chief Complaint Chief Complaint  Patient presents with  . Code Stroke    HPI [name] is a 68 y.o. male.  Patient is a 68 year old male who presents as a code stroke.  He was with family who noticed sudden onset of right-sided facial drooping.  Has had a history of prior stroke with some residual left-sided weakness.  He also history of alcohol use but he says he last drank about 2 3 days ago.  He was taken immediately to the CT scanner on arrival.  History is limited due to his agitation.     Past Medical History:  Diagnosis Date  . Arthritis   . Cameron ulcer, chronic 01/11/2018  . Chronic back pain   . GERD (gastroesophageal reflux disease)    uses Baking Soda  . GIB (gastrointestinal bleeding) 2012  . Glaucoma    right eye  . Headache    occasionally  . Hepatitis C   . Hiatal hernia with gastroesophageal reflux disease and esophagitis 01/05/2018  . History of blood transfusion    no abnormal reaction noted  . History of gout    doesn't take any meds  . Hyperlipidemia    not on any meds  . Hypertension    takes Amlodipine and Lisinopril daily  . Insomnia    takes Ambien nightly  . Ischemic colitis ([place]) 2012  . Joint pain   . Nocturia   . Numbness    both legs occasionally  . PONV (postoperative nausea and vomiting)   . Prostate cancer ([place])   . Shortness of breath dyspnea    rarely but when notices he can be lying/sitting/exertion.Dr.Hochrein is aware per pt  . Stroke ([place]) 2016   . Urinary frequency   . Urinary urgency     Patient Active Problem List   Diagnosis Date Noted  . ICH (intracerebral hemorrhage) ([place]) 07/20/2018  . Osteoarthritis of left hip 04/06/2018  . Cameron ulcer, chronic 01/11/2018  . Microcytic anemia   . Symptomatic anemia 01/10/2018  . Syncope 01/10/2018  . Hiatal hernia with  gastroesophageal reflux disease and esophagitis 01/05/2018  . Cigarette nicotine dependence without complication [id number]/[id number]  . Insomnia 01/05/2018  . Acute pain of left shoulder 11/21/2017  . Anemia, iron deficiency   . Gastrointestinal bleeding 10/24/2015  . Absolute anemia   . Tobacco abuse   . Acute ischemic stroke ([place])   . Acute CVA (cerebrovascular accident) ([place]) 05/31/2015  . Stroke ([place]) 05/31/2015  . Dyspnea 04/10/2014  . HTN (hypertension) 04/10/2014  . Inguinal hernia without mention of obstruction or gangrene, unilateral or unspecified, (not specified as recurrent)-right 01/30/2014    Past Surgical History:  Procedure Laterality Date  . APPENDECTOMY    . BIOPSY  01/11/2018   Procedure: BIOPSY;  Surgeon: Gatha Mayer, MD;  Location: [facility] ENDOSCOPY;  Service: Endoscopy;;  . COLONOSCOPY    . COLONOSCOPY N/A 10/26/2015   Procedure: COLONOSCOPY;  Surgeon: Doran Stabler, MD;  Location[facility] ENDOSCOPY;  Service: Endoscopy;  Laterality: N/A;  . ESOPHAGOGASTRODUODENOSCOPY    . ESOPHAGOGASTRODUODENOSCOPY N/A 10/26/2015   Procedure: ESOPHAGOGASTRODUODENOSCOPY (EGD);  Surgeon: Doran Stabler, MD;  Location[facility] ENDOSCOPY;  Service: Endoscopy;  Laterality: N/A;  . ESOPHAGOGASTRODUODENOSCOPY (EGD) WITH PROPOFOL N/A 01/11/2018   Procedure: ESOPHAGOGASTRODUODENOSCOPY (EGD) WITH PROPOFOL;  Surgeon: Gatha Mayer, MD;  Location: [facility]  ENDOSCOPY;  Service: Endoscopy;  Laterality: N/A;  . INGUINAL HERNIA REPAIR Right 11/04/2014   Procedure: RIGHT INGUINAL HERNIA REPAIR WITH MESH;  Surgeon: Jackolyn Confer, MD;  Location: [facility];  Service: General;  Laterality: Right;  . MASS EXCISION N/A 11/04/2014   Procedure: REMOVAL OF RIGHT GROIN SOFT TISSUE MASS;  Surgeon: Jackolyn Confer, MD;  Location: [facility];  Service: General;  Laterality: N/A;  . Multiple abdominal surgeries     total of 13  . PROSTATECTOMY    . SMALL INTESTINE SURGERY          Home Medications    Prior to Admission  medications   Medication Sig Start Date End Date Taking? Authorizing Provider  amLODipine (NORVASC) 10 MG tablet Take 1 tablet (10 mg total) by mouth daily. 10/18/17   [name], MD  bisacodyl (BISACODYL) 5 MG EC tablet TAKE 1 TABLET BY MOUTH AS NEEDED FOR MODERATE CONSTIPATION 03/30/18   Billie Ruddy, MD  cloNIDine (CATAPRES) 0.1 MG tablet Take 3 tablets (0.3 mg total) by mouth 2 (two) times daily. 06/24/18   Tegeler, Gwenyth Allegra, MD  Diclofenac Sodium 3 % GEL Place 1 application onto the skin 2 (two) times daily. To affected area. 07/26/17   Waynetta Pean, PA-C  DULoxetine (CYMBALTA) 30 MG capsule Take 1 capsule (30 mg total) by mouth 2 (two) times daily. 10/18/17   [name], MD  ferrous sulfate 325 (65 FE) MG tablet TAKE 1 TABLET BY MOUTH THREE TWICE A DAY WITH MEALS 03/30/18   Billie Ruddy, MD  hydrochlorothiazide (MICROZIDE) 12.5 MG capsule Take 2 capsules (25 mg total) by mouth daily. 06/24/18   Tegeler, Gwenyth Allegra, MD  HYDROcodone-acetaminophen (NORCO/VICODIN) 5-325 MG tablet Take 1 tablet by mouth every 6 (six) hours as needed for moderate pain. 12/01/17   [name], MD  pantoprazole (PROTONIX) 40 MG tablet TAKE 1 TABLET BY MOUTH TWICE A DAY BEFORE A MEAL 02/27/18   Billie Ruddy, MD  polyethylene glycol (MIRALAX / GLYCOLAX) packet Take 17 g by mouth daily as needed for severe constipation. 01/13/18   Amin, Jeanella Flattery, MD  sucralfate (CARAFATE) 1 GM/10ML suspension Take 10 mLs (1 g total) by mouth 4 (four) times daily -  with meals and at bedtime for 15 days. 01/13/18 07/20/18  Amin, Jeanella Flattery, MD  traZODone (DESYREL) 50 MG tablet Take 1 tablet (50 mg total) by mouth at bedtime. 03/30/18   Billie Ruddy, MD  zolpidem (AMBIEN) 10 MG tablet Take 1 tablet (10 mg total) by mouth at bedtime as needed for sleep. 04/24/18 07/20/18  Chesley Mires, MD    Family History Family History  Problem Relation Age of Onset  . Alzheimer's disease Father   . Cancer Mother         Lymph node  . Heart failure Brother 45       Transplant 13 years ago  . CAD Brother   . Heart disease Sister 42       MI    Social History Social History   Tobacco Use  . Smoking status: Current Every Day Smoker    Packs/day: 0.50    Years: 50.00    Pack years: 25.00    Types: Cigarettes  . Smokeless tobacco: Never Used  Substance Use Topics  . Alcohol use: Yes    Alcohol/week: 0.0 standard drinks    Comment: occasionally  . Drug use: No    Frequency: 5.0 times per week  Allergies   Penicillins and Sudafed [pseudoephedrine]   Review of Systems Review of Systems  Unable to perform ROS: Mental status change     Physical Exam Updated Vital Signs BP (!) 204/77   Pulse 76   Temp 98.2 F (36.8 C) (Oral)   Resp (!) 26   SpO2 96%   Physical Exam Constitutional:      Appearance: He is well-developed. He is diaphoretic.     Comments: Patient is very agitated  HENT:     Head: Normocephalic and atraumatic.  Eyes:     Pupils: Pupils are equal, round, and reactive to light.  Neck:     Musculoskeletal: Normal range of motion and neck supple.  Cardiovascular:     Rate and Rhythm: Normal rate and regular rhythm.     Heart sounds: Normal heart sounds.  Pulmonary:     Effort: Pulmonary effort is normal. No respiratory distress.     Breath sounds: Normal breath sounds. No wheezing or rales.  Chest:     Chest wall: No tenderness.  Abdominal:     General: Bowel sounds are normal.     Palpations: Abdomen is soft.     Tenderness: There is no abdominal tenderness. There is no guarding or rebound.  Musculoskeletal: Normal range of motion.  Lymphadenopathy:     Cervical: No cervical adenopathy.  Skin:    General: Skin is warm.     Findings: No rash.  Neurological:     Mental Status: He is alert and oriented to person, place, and time.     Comments: Positive facial droop with some right arm and leg weakness as compared to the left      ED Treatments / Results   Labs (all labs ordered are listed, but only abnormal results are displayed) Labs Reviewed  PROTIME-INR - Abnormal; Notable for the following components:      Result Value   Prothrombin Time 15.4 (*)    All other components within normal limits  APTT - Abnormal; Notable for the following components:   aPTT 39 (*)    All other components within normal limits  CBC - Abnormal; Notable for the following components:   RBC 4.04 (*)    Hemoglobin 9.0 (*)    HCT 32.7 (*)    MCH 22.3 (*)    MCHC 27.5 (*)    RDW 17.9 (*)    Platelets 421 (*)    All other components within normal limits  DIFFERENTIAL - Abnormal; Notable for the following components:   Abs Immature Granulocytes 0.11 (*)    All other components within normal limits  COMPREHENSIVE METABOLIC PANEL - Abnormal; Notable for the following components:   Glucose, Bld 116 (*)    Total Bilirubin <0.1 (*)    All other components within normal limits  RAPID URINE DRUG SCREEN, HOSP PERFORMED - Abnormal; Notable for the following components:   Tetrahydrocannabinol POSITIVE (*)    All other components within normal limits  I-STAT CHEM 8, ED - Abnormal; Notable for the following components:   Sodium 146 (*)    Glucose, Bld 114 (*)    Hemoglobin 10.9 (*)    HCT 32.0 (*)    All other components within normal limits  ETHANOL  I-STAT TROPONIN, ED  CBG MONITORING, ED    EKG EKG Interpretation  Date/Time:  Thursday July 20 2018 16:25:35 EST Ventricular Rate:  121 PR Interval:    QRS Duration: 100 QT Interval:  331 QTC Calculation: 470 R  Axis:   -10 Text Interpretation:  Sinus tachycardia Abnormal R-wave progression, early transition Left ventricular hypertrophy ST elevation, consider inferior injury Baseline wander in lead(s) V5 SINCE LAST TRACING HEART RATE HAS INCREASED Confirmed by Malvin Johns [phone]) on 07/20/2018 6:12:11 PM   Radiology Ct Head Code Stroke Wo Contrast  Result Date: 07/20/2018 CLINICAL DATA:  Code stroke.  Combative with confusion. Hypertensive patient with RIGHT arm weakness. EXAM: CT HEAD WITHOUT CONTRAST TECHNIQUE: Contiguous axial images were obtained from the base of the skull through the vertex without intravenous contrast. COMPARISON:  01/10/2018. FINDINGS: Motion degraded scan.  Overall study diagnostic. Brain: There is a 16 x 15 x 22 mm acute hemorrhage, approximate volume 3 mL, involving the LEFT lateral superior thalamus extending into the periventricular/posterior lentiform nucleus region. Mild surrounding edema. No midline shift. No intraventricular or subarachnoid extension. Premature for age atrophy. Chronic microvascular ischemic change of the periventricular and subcortical white matter. Vascular: Calcification of the cavernous internal carotid arteries consistent with cerebrovascular atherosclerotic disease. No signs of intracranial large vessel occlusion. Skull: Calvarium intact. Sinuses/Orbits: No significant sinus disease. Negative orbits. Other: None. Compared with 01/10/2018, the degree of atrophy is similar. The bleed is acute. ASPECTS [facility] Stroke Program Early CT Score) Not applicable due to bleed. IMPRESSION: 1. 16 x 15 x 22 mm acute hemorrhage involving the LEFT lateral superior thalamus extending into the periventricular/posterior lentiform nucleus region. Mild surrounding edema. No midline shift. Hypertensive etiology is likely. 2. ASPECTS is not applicable due to bleed. 3. Critical Value/emergent results were called by telephone at the time of interpretation on 07/20/2018 at 4:28 pm to Dr. Amie Portland , who verbally acknowledged these results. * Electronically Signed   By: Staci Righter M.D.   On: 07/20/2018 16:33    Procedures Procedures (including critical care time)  Medications Ordered in ED Medications   stroke: mapping our early stages of recovery book (has no administration in time range)  acetaminophen (TYLENOL) tablet 650 mg (has no administration in time range)     Or  acetaminophen (TYLENOL) solution 650 mg (has no administration in time range)    Or  acetaminophen (TYLENOL) suppository 650 mg (has no administration in time range)  senna-docusate (Senokot-S) tablet 1 tablet (has no administration in time range)  pantoprazole (PROTONIX) injection 40 mg (has no administration in time range)  labetalol (NORMODYNE,TRANDATE) injection 10 mg (10 mg Intravenous Not Given 07/20/18 1713)  labetalol (NORMODYNE,TRANDATE) 5 MG/ML injection (has no administration in time range)  clevidipine (CLEVIPREX) infusion 0.5 mg/mL (32 mg/hr Intravenous Rate/Dose Change 07/20/18 1727)  LORazepam (ATIVAN) 2 MG/ML injection (has no administration in time range)  labetalol (NORMODYNE,TRANDATE) injection 20 mg (20 mg Intravenous Given 07/20/18 1626)  hydrALAZINE (APRESOLINE) injection 10 mg (10 mg Intravenous Given 07/20/18 1718)  labetalol (NORMODYNE,TRANDATE) injection 10 mg (10 mg Intravenous Given 07/20/18 1659)  LORazepam (ATIVAN) injection 0.5 mg (0.5 mg Intravenous Given 07/20/18 1733)     Initial Impression / Assessment and Plan / ED Course  I have reviewed the triage vital signs and the nursing notes.  Pertinent labs & imaging results that were available during my care of the patient were reviewed by me and considered in my medical decision making (see chart for details).     Patient noted to be agitated.  He has evidence of intracranial hemorrhage on CT scan.  He was started on Cleviprex and labetalol.  He was also given Ativan for his agitation.  He will be admitted to the neurosurgical ICU.  He is not on anticoagulants...  CRITICAL CARE Performed by: Malvin Johns Total critical care time: 45 minutes Critical care time was exclusive of separately billable procedures and treating other patients. Critical care was necessary to treat or prevent imminent or life-threatening deterioration. Critical care was time spent personally by me on the following activities: development of  treatment plan with patient and/or surrogate as well as nursing, discussions with consultants, evaluation of patient's response to treatment, examination of patient, obtaining history from patient or surrogate, ordering and performing treatments and interventions, ordering and review of laboratory studies, ordering and review of radiographic studies, pulse oximetry and re-evaluation of patient's condition.   Final Clinical Impressions(s) / ED Diagnoses   Final diagnoses:  None    ED Discharge Orders    None       Malvin Johns, MD 07/20/18 [id number]

## 2018-07-20 NOTE — ED Triage Notes (Signed)
Pt was walking today with his girlfriend when he had sudden weakness. Hx of stroke. BP-189/103

## 2018-07-20 NOTE — H&P (Signed)
STROKE ICH ADMISSION CC: LSW  History is obtained from:patient, chart  HPI: [name] is a 68 y.o. male past medical history of a prior stroke with residual left-sided weakness, hepatitis C, hypertension that is uncontrolled, shortness of breath, tobacco abuse, substance abuse in the past, presented to the emergency room and EMS was called because of his worsening slurred speech and right-sided facial droop. The patient was very agitated and did not provide reliable history.  He kept on saying that his right-sided weakness is baseline whereas chart review revealed that he only had left-sided weakness from his prior pontine stroke. His blood pressures by EMS were in the 200s. His blood pressure repeat was 008 systolic Noncontrast CT of the head was done and it showed a bleed in the left lateral superior thalamus extending into the periventricular/posterior lentiform nucleus region with mild surrounding edema and no midline shift. Patient will be admitted to the neuro ICU for further monitoring and management of his severe hypertension/hypertensive emergency.  LKW: [id number] 07/20/18 tpa given?: no, ICH Premorbid modified Rankin scale (mRS): Patient unable to clearly provide but to the best of her history taking-2  ROS: ROS was performed and is negative except as noted in the HPI.   Past Medical History:  Diagnosis Date  . Arthritis   . [place] ulcer, chronic 01/11/2018  . Chronic back pain   . GERD (gastroesophageal reflux disease)    uses Baking Soda  . GIB (gastrointestinal bleeding) 2012  . Glaucoma    right eye  . Headache    occasionally  . Hepatitis C   . Hiatal hernia with gastroesophageal reflux disease and esophagitis 01/05/2018  . History of blood transfusion    no abnormal reaction noted  . History of gout    doesn't take any meds  . Hyperlipidemia    not on any meds  . Hypertension    takes Amlodipine and Lisinopril daily  . Insomnia    takes Ambien nightly  .  Ischemic colitis ([place]) 2012  . Joint pain   . Nocturia   . Numbness    both legs occasionally  . PONV (postoperative nausea and vomiting)   . Prostate cancer ([name])   . Shortness of breath dyspnea    rarely but when notices he can be lying/sitting/exertion.Dr.Hochrein is aware per pt  . Stroke ([place]) 2016   . Urinary frequency   . Urinary urgency    Family History  Problem Relation Age of Onset  . Alzheimer's disease Father   . Cancer Mother        Lymph node  . Heart failure Brother 45       Transplant 13 years ago  . CAD Brother   . Heart disease Sister 50       MI   Social History:   reports that he has been smoking cigarettes. He has a 25.00 pack-year smoking history. He has never used smokeless tobacco. He reports current alcohol use. He reports that he does not use drugs.  Medications  Current Facility-Administered Medications:  .   stroke: mapping our early stages of recovery book, , Does not apply, Once, Amie Portland, MD .  acetaminophen (TYLENOL) tablet 650 mg, 650 mg, Oral, Q4H PRN **OR** acetaminophen (TYLENOL) solution 650 mg, 650 mg, Per Tube, Q4H PRN **OR** acetaminophen (TYLENOL) suppository 650 mg, 650 mg, Rectal, Q4H PRN, Amie Portland, MD .  labetalol (NORMODYNE,TRANDATE) injection 20 mg, 20 mg, Intravenous, Once **AND** clevidipine (CLEVIPREX) infusion 0.5 mg/mL, 0-21 mg/hr,  Intravenous, Continuous, Amie Portland, MD .  pantoprazole (PROTONIX) injection 40 mg, 40 mg, Intravenous, QHS, Amie Portland, MD .  senna-docusate (Senokot-S) tablet 1 tablet, 1 tablet, Oral, BID, Amie Portland, MD  Current Outpatient Medications:  .  amLODipine (NORVASC) 10 MG tablet, Take 1 tablet (10 mg total) by mouth daily., Disp: 90 tablet, Rfl: 1 .  bisacodyl (BISACODYL) 5 MG EC tablet, TAKE 1 TABLET BY MOUTH AS NEEDED FOR MODERATE CONSTIPATION, Disp: 30 tablet, Rfl: 0 .  cloNIDine (CATAPRES) 0.1 MG tablet, Take 3 tablets (0.3 mg total) by mouth 2 (two) times daily., Disp: 60  tablet, Rfl: 0 .  Diclofenac Sodium 3 % GEL, Place 1 application onto the skin 2 (two) times daily. To affected area., Disp: 100 g, Rfl: 0 .  DULoxetine (CYMBALTA) 30 MG capsule, Take 1 capsule (30 mg total) by mouth 2 (two) times daily., Disp: 60 capsule, Rfl: 1 .  ferrous sulfate 325 (65 FE) MG tablet, TAKE 1 TABLET BY MOUTH THREE TWICE A DAY WITH MEALS, Disp: 90 tablet, Rfl: 3 .  hydrochlorothiazide (MICROZIDE) 12.5 MG capsule, Take 2 capsules (25 mg total) by mouth daily., Disp: 90 capsule, Rfl: 0 .  HYDROcodone-acetaminophen (NORCO/VICODIN) 5-325 MG tablet, Take 1 tablet by mouth every 6 (six) hours as needed for moderate pain., Disp: 30 tablet, Rfl: 0 .  pantoprazole (PROTONIX) 40 MG tablet, TAKE 1 TABLET BY MOUTH TWICE A DAY BEFORE A MEAL, Disp: 60 tablet, Rfl: 0 .  polyethylene glycol (MIRALAX / GLYCOLAX) packet, Take 17 g by mouth daily as needed for severe constipation., Disp: 14 each, Rfl: 0 .  sucralfate (CARAFATE) 1 GM/10ML suspension, Take 10 mLs (1 g total) by mouth 4 (four) times daily -  with meals and at bedtime for 15 days., Disp: 420 mL, Rfl: 0 .  traZODone (DESYREL) 50 MG tablet, Take 1 tablet (50 mg total) by mouth at bedtime., Disp: 30 tablet, Rfl: 2 .  zolpidem (AMBIEN) 10 MG tablet, Take 1 tablet (10 mg total) by mouth at bedtime as needed for sleep., Disp: 30 tablet, Rfl: 5  Exam: Current vital signs: BP (!) 200/92   Pulse (!) 105   Temp 98.2 F (36.8 C) (Oral)   Resp (!) 23   SpO2 100%  Vital signs in last 24 hours: Temp:  [98.2 F (36.8 C)] 98.2 F (36.8 C) (01/02 1627) Pulse Rate:  [99-116] 105 (01/02 1648) Resp:  [21-30] 23 (01/02 1648) BP: (200-242)/(92-116) 200/92 (01/02 1648) SpO2:  [100 %] 100 % (01/02 1648) General: Very anxious looking and diaphoretic man HEENT: Normocephalic atraumatic CVS: C5-8 heard regular rhythm Chest: Wheezing all over Ext: warm well perfused Neurological exam Patient awake alert oriented x3 Extremely agitated and  diaphoretic. Keeps on saying he wants to get out of the hospital His speech is dysarthric Naming comprehension repetition are intact Poor attention concentration Cranial nerves: Pupils equal round react light, extraocular movements intact, visual fields full, right lower facial weakness, auditory daily intact, palate midline, tongue midline. Motor exam: 2/5 right upper extremity, 2/5 right lower extremity, 3-4/5 left upper extremity and some limitation due to pain, 4/5 left lower extremity. Sensory exam: Intact to touch all over according to him Coordination: Intact finger-nose-finger on the left.  Unable to perform the right. Unable to perform the heel-knee-shin test NIHSS 1a Level of Conscious.: 0 1b LOC Questions: 0 1c LOC Commands: 0 2 Best Gaze: 0 3 Visual: 0 4 Facial Palsy: 2 5a Motor Arm - left: 3 5b Motor Arm -  Right: 1 6a Motor Leg - Left: 2 6b Motor Leg - Right: 2 7 Limb Ataxia: 0 8 Sensory: 0 9 Best Language: 0 10 Dysarthria: 1 11 Extinct. and Inatten.: 0 TOTAL: 11    Labs I have reviewed labs in epic and the results pertinent to this consultation are:  CBC    Component Value Date/Time   WBC 4.9 03/14/2018 1511   RBC 4.03 (L) 03/14/2018 1511   HGB 10.4 (L) 03/14/2018 1511   HCT 32.8 (L) 03/14/2018 1511   HCT 32.1 (L) 01/11/2018 0747   PLT 489.0 (H) 03/14/2018 1511   MCV 81.4 03/14/2018 1511   MCH 19.1 (L) 01/13/2018 0357   MCHC 31.7 03/14/2018 1511   RDW 24.2 (H) 03/14/2018 1511   LYMPHSABS 1.7 03/14/2018 1511   MONOABS 0.4 03/14/2018 1511   EOSABS 0.0 03/14/2018 1511   BASOSABS 0.1 03/14/2018 1511    CMP     Component Value Date/Time   NA 139 02/01/2018 1021   NA 143 06/23/2017 1413   K 4.5 02/01/2018 1021   CL 104 02/01/2018 1021   CO2 27 02/01/2018 1021   GLUCOSE 88 02/01/2018 1021   BUN 13 02/01/2018 1021   BUN 14 06/23/2017 1413   CREATININE 1.10 02/01/2018 1021   CALCIUM 9.2 02/01/2018 1021   PROT 7.3 01/11/2018 1716   ALBUMIN 4.1  01/11/2018 1716   AST 13 (L) 01/11/2018 1716   ALT 9 01/11/2018 1716   ALT 16 05/12/2016 1550   ALKPHOS 92 01/11/2018 1716   BILITOT 1.2 01/11/2018 1716   GFRNONAA >60 01/13/2018 0357   GFRAA >60 01/13/2018 0357  PT 15.4, INR 1.23.  Imaging I have reviewed the images obtained: CT-scan of the brain-left thalamic/basal ganglia bleed with no IVH.  Assessment:  68 year old man past medical history of uncontrolled hypertension, prior stroke with residual left hemiparesis, tobacco abuse, drug abuse, data C, presenting to the emergency room after EMS was called by family or bystanders for worsening dysarthria and facial droop. On examination, he has right-sided hemiparesis right facial droop and dysarthria. CT head shows left thalamic/basal ganglia bleed-likely hypertensive Patient is very agitated and very difficult to control blood pressures.  He came in with systolics in the 983J that went down to 180 spontaneously and then started to rise up again and to 130s. He continued to refuse treatment and threatened to walk out.  Plan: Subcortical ICH, nontraumatic Acuity: Acute Laterality: Left Current suspected etiology: Hypertension Treatment: -Admit to neuro ICU -ICH Score: 0 -ICH Volume: Less than 30 -BP control goal SYS<140 -PT/OT/ST  -neuromonitoring  CNS Cerebral edema Compression of brain -Close neuro monitoring -No indication for hyperosmolar therapy -No evidence of hydrocephalus -PT/INR within normal range.  No need for reversal.   Dysarthria Dysphagia following ICH  -NPO until cleared by speech -ST -Advance diet as tolerated  Toxic encephalopathy -Correct metabolic causes -Monitor -Check urinary toxicology screen  Hemiplegia and hemiparesis following nontraumatic intracerebral hemorrhage affecting right dominant side  -Continue PT/OT/ST  Agitation -Would avoid sedating agents but will use mild antianxiety/antipsychotics as needed  RESP Short of breath.   Unclear if he has a history of COPD. Monitor clinically Keep O2 sats over 95% O2 supplementation DuoNeb's as needed  CV Hypertensive Emergency-difficult to control blood pressures.  Patient very uncooperative with treatment.  Agitated. -Aggressive BP control, goal SBP < 140 -Labetalol, Cleviprex as needed. -We will also try hydralazine  GI Hepatitis C -Normal PT/INR-no reversal necessary -Monitor liver function test  GU No active issues Gentle hydration  HEME Iron deficiency anemia Monitor No evidence of coagulopathy   ENDO -goal HgbA1c < 7  Fluid/Electrolyte Disorders Check labs Replete as necessary  ID Possible Aspiration PNA -CXR -NPO -Monitor  Nutrition E66.9 Obesity  -diet consult  Prophylaxis DVT: No antiplatelets-use SCDs only Bowel: Docusate senna  Dispo: IP Rehab  Diet: NPO until cleared by speech or bedside evaluation  Code Status: Full Code    THE FOLLOWING WERE PRESENT ON ADMISSION: Hemiparesis Intracerebral hemorrhage Cerebral edema Hypertensive emergency Hepatitis C   -- Amie Portland, MD [facility] Pager: [phone] If 7pm to 7am, please call on call as listed on AMION.   CRITICAL CARE ATTESTATION Performed by: Amie Portland, MD Total critical care time: 60 minutes Critical care time was exclusive of separately billable procedures and treating other patients and/or supervising APPs/Residents/Students Critical care was necessary to treat or prevent imminent or life-threatening deterioration due to intracerebral hemorrhage, hypertensive emergency This patient is critically ill and at significant risk for neurological worsening and/or death and care requires constant monitoring. Critical care was time spent personally by me on the following activities: development of treatment plan with patient and/or surrogate as well as nursing, discussions with consultants, evaluation of patient's response to treatment,  examination of patient, obtaining history from patient or surrogate, ordering and performing treatments and interventions, ordering and review of laboratory studies, ordering and review of radiographic studies, pulse oximetry, re-evaluation of patient's condition, participation in multidisciplinary rounds and medical decision making of high complexity in the care of this patient.

## 2018-07-20 NOTE — Code Documentation (Signed)
68yo male arriving to [facility] via [place] at 1604. Patient was reportedly walking with his girlfriend when he had sudden onset generalized weakness. EMS called and assessed right facial droop and slurred speech and activated a code stroke. Of note, patient with h/o pontine infarct with baseline residual right sided weakness. Patient hypertensive with SBP in the 200s per EMS. Stroke team at the bedside on patient arrival. Labs drawn and patient cleared for CT by Dr. Regenia Skeeter. Patient to CT with team. CT showing ICH. NIHSS 10, see documentation for details and code stroke times. Patient with difficulty crossing midline to the left, right facial droop and dysarthria on exam. Patient exam inconsistent with regards to weakness as patient agitated and uncooperative with exam. Patient hypertensive - Labetalol 20mg  IVP followed by Cleviprex gtt initiated per orders. BP difficult to treat as patient agitated without redirection despite constant efforts by nursing. Cleviprex gtt maxed out to 21mg /hr without meeting SBP goal. Patient continues to remain agitated and diaphoretic. Dr. Rory Percy made aware. Order for additional Labetalol 10mg  IVP and Hydralazine IVP. Medications administered without meeting SBP goal. Dr. Rory Percy to the bedside. Order for Ativan IVP and to increase Cleviprex to 32mg /hr. Patient to be admitted to ICU. Bedside handoff with ED RN Almyra Free.

## 2018-07-21 ENCOUNTER — Inpatient Hospital Stay (HOSPITAL_COMMUNITY): Payer: Medicare Other

## 2018-07-21 DIAGNOSIS — F17200 Nicotine dependence, unspecified, uncomplicated: Secondary | ICD-10-CM

## 2018-07-21 DIAGNOSIS — Z8673 Personal history of transient ischemic attack (TIA), and cerebral infarction without residual deficits: Secondary | ICD-10-CM

## 2018-07-21 DIAGNOSIS — I161 Hypertensive emergency: Secondary | ICD-10-CM

## 2018-07-21 DIAGNOSIS — F1210 Cannabis abuse, uncomplicated: Secondary | ICD-10-CM

## 2018-07-21 DIAGNOSIS — I6389 Other cerebral infarction: Secondary | ICD-10-CM

## 2018-07-21 LAB — ECHOCARDIOGRAM COMPLETE: Weight: 3248.7 oz

## 2018-07-21 MED ORDER — IOPAMIDOL (ISOVUE-370) INJECTION 76%
INTRAVENOUS | Status: AC
  Filled 2018-07-21: qty 100

## 2018-07-21 MED ORDER — IOPAMIDOL (ISOVUE-370) INJECTION 76%
100.0000 mL | Freq: Once | INTRAVENOUS | Status: AC | PRN
  Administered 2018-07-21: 100 mL via INTRAVENOUS

## 2018-07-21 NOTE — Evaluation (Addendum)
Physical Therapy Evaluation Patient Details Name: [name] MRN: [id number] DOB: [birth year] Today's Date: 07/21/2018   History of Present Illness  68 yo admitted with slurred speech and Rt facial droop with Left lateral thalamic ICH with HTN, agitation acutely. PMHx; Rt pontine infarct with left weakness 2016, hep C, HTN, SOB, tobacco use, glaucoma, prostate CA  Clinical Impression  Pt in 5 point restraints on arrival and educated for place and purpose for therapy. Pt very agreeable to progress mobility to recognize deficits from [place]. Pt with right hemiplegia, facial droop, decreased balance, safety and cognition who currently requires 2 person assist for transfers and will benefit from acute therapy to maximize mobility and function. Pt needs intensive therapy to maximize function however he does not demonstrate comprehension for his deficits as he is insistent on returning home "you can't keep me here" "I will crawl to the bathroom if I have to". Pt agreeable to working with therapy to progress toward WC level transfers for home function. Pt with sitter present end of session and restraints reapplied.     Follow Up Recommendations CIR;Supervision/Assistance - 24 hour    Equipment Recommendations  Wheelchair (measurements PT);Hospital bed;3in1 (PT);Other (comment)(hoyer lift)    Recommendations for Other Services Rehab consult;Speech consult     Precautions / Restrictions Precautions Precautions: Fall Precaution Comments: right hemiplegia, right lean      Mobility  Bed Mobility Overal bed mobility: Needs Assistance Bed Mobility: Supine to Sit     Supine to sit: Mod assist     General bed mobility comments: mod assist to pivot to EOB with HHA to move trunk, assist to elevate trunk and scoot to EOB  Transfers Overall transfer level: Needs assistance   Transfers: Sit to/from Stand;Stand Pivot Transfers Sit to Stand: Mod assist;+2 physical assistance Stand pivot  transfers: Max assist;+2 physical assistance       General transfer comment: mod assist with right knee blocked to stand from bed with bil UE support, pivot to left with pt bearing weight on left heel and partially stepping with left foot, no stepping of right foot. Max assist to control pelvis and trunk for balance and safety  Ambulation/Gait             General Gait Details: unable at this time  Stairs            Wheelchair Mobility    Modified Rankin (Stroke Patients Only) Modified Rankin (Stroke Patients Only) Pre-Morbid Rankin Score: Moderate disability Modified Rankin: Severe disability     Balance Overall balance assessment: Needs assistance Sitting-balance support: No upper extremity supported;Feet supported Sitting balance-Leahy Scale: Zero Sitting balance - Comments: pt with right lean and with LUE support pushes further to the right. Max assist for sitting balance with facilitation for midline EOB grossly 8 min. Pt propping on LUE during session   Standing balance support: Bilateral upper extremity supported Standing balance-Leahy Scale: Poor                               Pertinent Vitals/Pain Pain Assessment: No/denies pain    Home Living Family/patient expects to be discharged to:: Private residence Living Arrangements: Spouse/significant other Available Help at Discharge: Friend(s);Available 24 hours/day Type of Home: Apartment Home Access: Level entry     Home Layout: One level Home Equipment: Grab bars - tub/shower;Cane - single point Additional Comments: Pt lives with girlfriend who cares for a 2 and 7yo during the  day    Prior Function Level of Independence: Independent with assistive device(s)         Comments: pt reports he normally walks with a cane and cares for himself     Hand Dominance   Dominant Hand: Right    Extremity/Trunk Assessment   Upper Extremity Assessment Upper Extremity Assessment: Defer to OT  evaluation    Lower Extremity Assessment Lower Extremity Assessment: RLE deficits/detail;LLE deficits/detail RLE Deficits / Details: 1/5 hip flexion, 3/5 knee extension LLE Deficits / Details: grossly 3/5 hip flexion and knee extension. Pt reports equal sensation    Cervical / Trunk Assessment Cervical / Trunk Assessment: Other exceptions Cervical / Trunk Exceptions: rounded shoulders, right lean in sitting  Communication   Communication: No difficulties  Cognition Arousal/Alertness: Awake/alert Behavior During Therapy: Agitated Overall Cognitive Status: Impaired/Different from baseline Area of Impairment: Orientation;Attention;Following commands;Problem solving;Awareness;Safety/judgement                 Orientation Level: Disoriented to;Situation;Time Current Attention Level: Selective   Following Commands: Follows one step commands with increased time Safety/Judgement: Decreased awareness of deficits;Decreased awareness of safety Awareness: Intellectual Problem Solving: Slow processing;Decreased initiation;Difficulty sequencing;Requires verbal cues;Requires tactile cues General Comments: pt unable to move RUE consistently with decreased gross motor and even after transfer states he is going home. "i will crawl to the bathroom"       General Comments      Exercises     Assessment/Plan    PT Assessment Patient needs continued PT services  PT Problem List Decreased strength;Decreased balance;Decreased cognition;Decreased knowledge of precautions;Decreased range of motion;Decreased mobility;Decreased knowledge of use of DME;Decreased activity tolerance;Decreased coordination;Decreased safety awareness;Impaired sensation       PT Treatment Interventions DME instruction;Functional mobility training;Balance training;Patient/family education;Therapeutic activities;Neuromuscular re-education;Therapeutic exercise;Cognitive remediation    PT Goals (Current goals can be found  in the Care Plan section)  Acute Rehab PT Goals Patient Stated Goal: go home  PT Goal Formulation: With patient Time For Goal Achievement: 08/04/18 Potential to Achieve Goals: Fair    Frequency Min 3X/week   Barriers to discharge Decreased caregiver support      Co-evaluation               AM-PAC PT "6 Clicks" Mobility  Outcome Measure Help needed turning from your back to your side while in a flat bed without using bedrails?: A Lot Help needed moving from lying on your back to sitting on the side of a flat bed without using bedrails?: A Lot Help needed moving to and from a bed to a chair (including a wheelchair)?: Total Help needed standing up from a chair using your arms (e.g., wheelchair or bedside chair)?: A Lot Help needed to walk in hospital room?: Total Help needed climbing 3-5 steps with a railing? : Total 6 Click Score: 9    End of Session Equipment Utilized During Treatment: Gait belt Activity Tolerance: Patient tolerated treatment well Patient left: in chair;with call bell/phone within reach;with restraints reapplied;with nursing/sitter in room;with chair alarm set Nurse Communication: Mobility status;Other (comment)(+2 pivot to left) PT Visit Diagnosis: Other abnormalities of gait and mobility (R26.89);Muscle weakness (generalized) (M62.81);Unsteadiness on feet (R26.81);Hemiplegia and hemiparesis Hemiplegia - Right/Left: Right Hemiplegia - dominant/non-dominant: Dominant Hemiplegia - caused by: Other Nontraumatic intracranial hemorrhage    Time: 6063-0160 PT Time Calculation (min) (ACUTE ONLY): 25 min   Charges:   PT Evaluation $PT Eval Moderate Complexity: 1 Mod          Margerite Impastato Pam Drown, PT  Acute Rehabilitation Services Pager: [phone] Office: [place] 07/21/2018, 2:31 PM

## 2018-07-21 NOTE — Evaluation (Signed)
Occupational Therapy Evaluation Patient Details Name: [name] MRN: [id number] DOB: [birth year] Today's Date: 07/21/2018    History of Present Illness 68 yo admitted with slurred speech and Rt facial droop with Left lateral thalamic ICH with HTN, agitation acutely. PMHx; Rt pontine infarct with left weakness 2016, hep C, HTN, SOB, tobacco use, glaucoma, prostate CA   Clinical Impression   PT admitted with see above. Pt currently with functional limitiations due to the deficits listed below (see OT problem list). Pt total +2 max to pivot to the left into the chair. Pt very adamant about going home and not doing extended rehab. Pt could benefit from capacity assessment and determine whom is POA to help with goals of care this admission. Pt will need to be at least wheelchair with one person (A) to d/c safely.  Pt will benefit from skilled OT to increase their independence and safety with adls and balance to allow discharge CIR.     Follow Up Recommendations  CIR    Equipment Recommendations  Other (comment)(defer to next venue)    Recommendations for Other Services Rehab consult     Precautions / Restrictions Precautions Precautions: Fall Precaution Comments: right hemiplegia, right lean      Mobility Bed Mobility Overal bed mobility: Needs Assistance Bed Mobility: Supine to Sit     Supine to sit: Mod assist     General bed mobility comments: mod assist to pivot to EOB with HHA to move trunk, assist to elevate trunk and scoot to EOB  Transfers Overall transfer level: Needs assistance   Transfers: Sit to/from Stand;Stand Pivot Transfers Sit to Stand: Mod assist;+2 physical assistance Stand pivot transfers: Max assist;+2 physical assistance       General transfer comment: mod assist with right knee blocked to stand from bed with bil UE support, pivot to left with pt bearing weight on left heel and partially stepping with left foot, no stepping of right foot. Max  assist to control pelvis and trunk for balance and safety    Balance Overall balance assessment: Needs assistance Sitting-balance support: No upper extremity supported;Feet supported Sitting balance-Leahy Scale: Zero Sitting balance - Comments: pt with right lean and with LUE support pushes further to the right. Max assist for sitting balance with facilitation for midline EOB grossly 8 min. Pt propping on LUE during session   Standing balance support: Bilateral upper extremity supported Standing balance-Leahy Scale: Poor                             ADL either performed or assessed with clinical judgement   ADL Overall ADL's : Needs assistance/impaired Eating/Feeding: NPO   Grooming: Wash/dry hands;Moderate assistance   Upper Body Bathing: Maximal assistance   Lower Body Bathing: Total assistance   Upper Body Dressing : Maximal assistance   Lower Body Dressing: Total assistance   Toilet Transfer: +2 for physical assistance;Maximal assistance             General ADL Comments: pt OOB to chair this session     Vision         Perception     Praxis      Pertinent Vitals/Pain Pain Assessment: No/denies pain     Hand Dominance Right   Extremity/Trunk Assessment Upper Extremity Assessment Upper Extremity Assessment: RUE deficits/detail;LUE deficits/detail RUE Deficits / Details: shoulder flexion 30 degrees, decr grasp , able to hold a cylinder but not again gravity or with weight >  1 oz LUE Deficits / Details: decr shoulder fleixon and reports "its been like that " pt with 30 degrees shoulder flexion.    Lower Extremity Assessment Lower Extremity Assessment: Defer to PT evaluation RLE Deficits / Details: 1/5 hip flexion, 3/5 knee extension LLE Deficits / Details: grossly 3/5 hip flexion and knee extension. Pt reports equal sensation   Cervical / Trunk Assessment Cervical / Trunk Assessment: Other exceptions Cervical / Trunk Exceptions: rounded  shoulders, right lean in sitting   Communication Communication Communication: No difficulties   Cognition Arousal/Alertness: Awake/alert Behavior During Therapy: Agitated Overall Cognitive Status: Impaired/Different from baseline Area of Impairment: Orientation;Attention;Following commands;Problem solving;Awareness;Safety/judgement                 Orientation Level: Disoriented to;Situation;Time Current Attention Level: Selective   Following Commands: Follows one step commands with increased time Safety/Judgement: Decreased awareness of deficits;Decreased awareness of safety Awareness: Intellectual Problem Solving: Slow processing;Decreased initiation;Difficulty sequencing;Requires verbal cues;Requires tactile cues General Comments: pt unable to move RUE consistently with decreased gross motor and even after transfer states he is going home. "i will crawl to the bathroom" Pt lacks awareness to deficits even with cueing. pt with very flat affect     General Comments       Exercises     Shoulder Instructions      Home Living Family/patient expects to be discharged to:: Private residence Living Arrangements: Spouse/significant other Available Help at Discharge: Friend(s);Available 24 hours/day Type of Home: Apartment Home Access: Level entry     Home Layout: One level     Bathroom Shower/Tub: Occupational psychologist: Handicapped height     Home Equipment: Grab bars - tub/shower;Cane - single point   Additional Comments: Pt lives with girlfriend who cares for a 2 and 33yo during the day      Prior Functioning/Environment Level of Independence: Independent with assistive device(s)        Comments: pt reports he normally walks with a cane and cares for himself        OT Problem List: Decreased strength;Decreased activity tolerance;Impaired balance (sitting and/or standing);Decreased safety awareness;Decreased knowledge of use of DME or AE;Decreased  knowledge of precautions;Cardiopulmonary status limiting activity;Impaired UE functional use;Impaired sensation;Decreased cognition      OT Treatment/Interventions: Self-care/ADL training;Therapeutic exercise;Neuromuscular education;Energy conservation;DME and/or AE instruction;Manual therapy;Modalities;Therapeutic activities;Cognitive remediation/compensation;Patient/family education;Balance training    OT Goals(Current goals can be found in the care plan section) Acute Rehab OT Goals Patient Stated Goal: go home today OT Goal Formulation: Patient unable to participate in goal setting Potential to Achieve Goals: Good  OT Frequency: Min 2X/week   Barriers to D/C: Decreased caregiver support          Co-evaluation PT/OT/SLP Co-Evaluation/Treatment: Yes Reason for Co-Treatment: Complexity of the patient's impairments (multi-system involvement);Necessary to address cognition/behavior during functional activity   OT goals addressed during session: ADL's and self-care;Proper use of Adaptive equipment and DME;Strengthening/ROM      AM-PAC OT "6 Clicks" Daily Activity     Outcome Measure Help from another person eating meals?: A Lot Help from another person taking care of personal grooming?: A Lot Help from another person toileting, which includes using toliet, bedpan, or urinal?: A Lot Help from another person bathing (including washing, rinsing, drying)?: A Lot Help from another person to put on and taking off regular upper body clothing?: A Lot Help from another person to put on and taking off regular lower body clothing?: A Lot 6 Click Score: 12  End of Session Equipment Utilized During Treatment: Gait belt Nurse Communication: Mobility status;Precautions  Activity Tolerance: Patient tolerated treatment well Patient left: in chair;with call bell/phone within reach;with chair alarm set;with nursing/sitter in room  OT Visit Diagnosis: Unsteadiness on feet (R26.81);Muscle weakness  (generalized) (M62.81)                Time: 1740-8144 OT Time Calculation (min): 26 min Charges:  OT General Charges $OT Visit: 1 Visit OT Evaluation $OT Eval Moderate Complexity: 1 Mod   Jeri Modena, OTR/L  Acute Rehabilitation Services Pager: [phone] Office: [phone] .   Jeri Modena 07/21/2018, 4:04 PM

## 2018-07-21 NOTE — Evaluation (Signed)
Speech Language Pathology Evaluation Patient Details Name: [name] MRN: [id number] DOB: May 04, 1951 Today's Date: 07/21/2018 Time: 3646-8032 SLP Time Calculation (min) (ACUTE ONLY): 22 min  Problem List:  Patient Active Problem List   Diagnosis Date Noted  . ICH (intracerebral hemorrhage) ([place]) 07/20/2018  . Osteoarthritis of left hip 04/06/2018  . Cameron ulcer, chronic 01/11/2018  . Microcytic anemia   . Symptomatic anemia 01/10/2018  . Syncope 01/10/2018  . Hiatal hernia with gastroesophageal reflux disease and esophagitis 01/05/2018  . Cigarette nicotine dependence without complication 07/11/8249  . Insomnia 01/05/2018  . Acute pain of left shoulder 11/21/2017  . Anemia, iron deficiency   . Gastrointestinal bleeding 10/24/2015  . Absolute anemia   . Tobacco abuse   . Acute ischemic stroke ([place])   . Acute CVA (cerebrovascular accident) ([place]) 05/31/2015  . Stroke ([place]) 05/31/2015  . Dyspnea 04/10/2014  . HTN (hypertension) 04/10/2014  . Inguinal hernia without mention of obstruction or gangrene, unilateral or unspecified, (not specified as recurrent)-right 01/30/2014   Past Medical History:  Past Medical History:  Diagnosis Date  . Arthritis   . Cameron ulcer, chronic 01/11/2018  . Chronic back pain   . GERD (gastroesophageal reflux disease)    uses Baking Soda  . GIB (gastrointestinal bleeding) 2012  . Glaucoma    right eye  . Headache    occasionally  . Hepatitis C   . Hiatal hernia with gastroesophageal reflux disease and esophagitis 01/05/2018  . History of blood transfusion    no abnormal reaction noted  . History of gout    doesn't take any meds  . Hyperlipidemia    not on any meds  . Hypertension    takes Amlodipine and Lisinopril daily  . Insomnia    takes Ambien nightly  . Ischemic colitis ([place]) 2012  . Joint pain   . Nocturia   . Numbness    both legs occasionally  . PONV (postoperative nausea and vomiting)   . Prostate cancer ([place])   .  Shortness of breath dyspnea    rarely but when notices he can be lying/sitting/exertion.Dr.Hochrein is aware per pt  . Stroke ([place]) 2016   . Urinary frequency   . Urinary urgency    Past Surgical History:  Past Surgical History:  Procedure Laterality Date  . APPENDECTOMY    . BIOPSY  01/11/2018   Procedure: BIOPSY;  Surgeon: Gatha Mayer, MD;  Location: [facility] ENDOSCOPY;  Service: Endoscopy;;  . COLONOSCOPY    . COLONOSCOPY N/A 10/26/2015   Procedure: COLONOSCOPY;  Surgeon: Doran Stabler, MD;  [facility] END[facility]COPY;  Service: Endoscopy;  Laterality: N/A;  . ESOPHAGOGASTRODUODENOSCOPY    . ESOPHAGOGASTRODUODENOSCOPY N/A 10/26/2015   Procedure: ESOPHAGOGASTRODUODENOSCOPY (EGD);  Surgeon: Doran Stabler, MD;  Location[facility] ENDOSCOPY;  Service: Endoscopy;  Laterality: N/A;  . ESOPHAGOGASTRODUODENOSCOPY (EGD) WITH PROPOFOL N/A 01/11/2018   Procedure: ESOPHAGOGASTRODUODENOSCOPY (EGD) WITH PROPOFOL;  Surgeon: Gatha Mayer, MD;  Location: [facility] ENDOSCOPY;  Service: Endoscopy;  Laterality: N/A;  . INGUINAL HERNIA REPAIR Right 11/04/2014   Procedure: RIGHT INGUINAL HERNIA REPAIR WITH MESH;  Surgeon: Jackolyn Confer, MD;  Location: [place];  Service: General;  Laterality: Right;  . MASS EXCISION N/A 11/04/2014   Procedure: REMOVAL OF RIGHT GROIN SOFT TISSUE MASS;  Surgeon: Jackolyn Confer, MD;  Location: [place];  Service: General;  Laterality: N/A;  . Multiple abdominal surgeries     total of 13  . PROSTATECTOMY    . SMALL INTESTINE SURGERY  HPI:  68 year old man past medical history of uncontrolled hypertension, prior stroke with residual left hemiparesis, tobacco abuse, drug abuse, data C, presenting to the emergency room after EMS was called by family or bystanders for worsening dysarthria and facial droop. MD reports right-sided hemiparesis right facial droop and dysarthria. CT head shows left thalamic/basal ganglia bleed-likely hypertensive.    Assessment / Plan / Recommendation Clinical  Impression  Pt demonstrates moderate cognitive deficits in safety awareness, memory. He is able to paricipate in verbal tasks of moderate complexity but working/short term memory is quite poor. After less than one minute pt recalled 0/4 words. He denies visual deficits but appeared to favor left visual field and could not locate images in right lower quadrant without assist. Will f/u for cognitive linguistic function acutely but deficits likely best addressed in CIR setting.     SLP Assessment  SLP Recommendation/Assessment: Patient needs continued Speech Lanaguage Pathology Services SLP Visit Diagnosis: Attention and concentration deficit Attention and concentration deficit following: Nontraumatic intracerebral hemorrhage    Follow Up Recommendations  Inpatient Rehab    Frequency and Duration min 1 x/week  2 weeks      SLP Evaluation Cognition  Overall Cognitive Status: Impaired/Different from baseline Arousal/Alertness: Awake/alert Orientation Level: Oriented X4 Attention: Sustained Sustained Attention: Appears intact Memory: Impaired Awareness: Impaired Awareness Impairment: Intellectual impairment Problem Solving: Impaired Problem Solving Impairment: Verbal complex;Functional complex Executive Function: Reasoning Behaviors: Restless       Comprehension  Auditory Comprehension Overall Auditory Comprehension: Appears within functional limits for tasks assessed Yes/No Questions: Within Functional Limits Commands: Within Functional Limits Conversation: Simple    Expression Verbal Expression Overall Verbal Expression: Appears within functional limits for tasks assessed Written Expression Dominant Hand: Right   Oral / Motor  Motor Speech Overall Motor Speech: Impaired Articulation: Impaired Level of Impairment: Conversation Intelligibility: Intelligible   GO                   Herbie Baltimore, MA CCC-SLP  Acute Rehabilitation Services Pager [phone] Office  [phone]  Lynann Beaver 07/21/2018, 3:08 PM

## 2018-07-21 NOTE — Progress Notes (Signed)
STROKE TEAM PROGRESS NOTE   SUBJECTIVE (INTERVAL HISTORY) His RN and sitter are at the bedside.  Overall his condition is stable. He was orientated and follow commands in the beginning of the rounds, however became agitated at end of round when he insisted going home today and our answer was "no".   OBJECTIVE Temp:  [97.6 F (36.4 C)-99.1 F (37.3 C)] 99.1 F (37.3 C) (01/03 0900) Pulse Rate:  [47-121] 67 (01/03 1000) Cardiac Rhythm: Normal sinus rhythm (01/03 0800) Resp:  [20-40] 31 (01/03 1000) BP: (102-242)/(51-118) 119/55 (01/03 1000) SpO2:  [78 %-100 %] 97 % (01/03 1000) Weight:  [92.1 kg] 92.1 kg (01/02 2000)  No results for input(s): GLUCAP in the last 168 hours. Recent Labs  Lab 07/20/18 1624 07/20/18 1629  NA 144 146*  K 3.6 3.6  CL 110 111  CO2 23  --   GLUCOSE 116* 114*  BUN 15 16  CREATININE 1.03 1.00  CALCIUM 9.0  --    Recent Labs  Lab 07/20/18 1624  AST 24  ALT 17  ALKPHOS 94  BILITOT <0.1*  PROT 7.1  ALBUMIN 4.0   Recent Labs  Lab 07/20/18 1624 07/20/18 1629  WBC 6.8  --   NEUTROABS 4.5  --   HGB 9.0* 10.9*  HCT 32.7* 32.0*  MCV 80.9  --   PLT 421*  --    No results for input(s): CKTOTAL, CKMB, CKMBINDEX, TROPONINI in the last 168 hours. Recent Labs    07/20/18 1624  LABPROT 15.4*  INR 1.23   No results for input(s): COLORURINE, LABSPEC, PHURINE, GLUCOSEU, HGBUR, BILIRUBINUR, KETONESUR, PROTEINUR, UROBILINOGEN, NITRITE, LEUKOCYTESUR in the last 72 hours.  Invalid input(s): APPERANCEUR     Component Value Date/Time   CHOL 107 02/13/2016 0748   TRIG 128.0 02/13/2016 0748   HDL 33.60 (L) 02/13/2016 0748   CHOLHDL 3 02/13/2016 0748   VLDL 25.6 02/13/2016 0748   LDLCALC 48 02/13/2016 0748   Lab Results  Component Value Date   HGBA1C 5.7 02/13/2016      Component Value Date/Time   LABOPIA NONE DETECTED 07/20/2018 1638   COCAINSCRNUR NONE DETECTED 07/20/2018 1638   LABBENZ NONE DETECTED 07/20/2018 1638   AMPHETMU NONE DETECTED  07/20/2018 1638   THCU POSITIVE (A) 07/20/2018 1638   LABBARB NONE DETECTED 07/20/2018 1638    Recent Labs  Lab 07/20/18 1746  ETH <10    I have personally reviewed the radiological images below and agree with the radiology interpretations.  Ct Head Wo Contrast  Result Date: 07/21/2018 CLINICAL DATA:  Intracranial hemorrhage follow up EXAM: CT HEAD WITHOUT CONTRAST TECHNIQUE: Contiguous axial images were obtained from the base of the skull through the vertex without intravenous contrast. COMPARISON:  07/20/2018 FINDINGS: Brain: Unchanged hemorrhage within the left thalamus. Mild surrounding edema. The size and configuration of the ventricles and extra-axial CSF spaces are normal. There is hypoattenuation of the periventricular white matter, most commonly indicating chronic ischemic microangiopathy. Vascular: No abnormal hyperdensity of the major intracranial arteries or dural venous sinuses. No intracranial atherosclerosis. Skull: The visualized skull base, calvarium and extracranial soft tissues are normal. Sinuses/Orbits: No fluid levels or advanced mucosal thickening of the visualized paranasal sinuses. No mastoid or middle ear effusion. The orbits are normal. IMPRESSION: Unchanged hemorrhage within the left thalamus with mild surrounding edema. Electronically Signed   By: Ulyses Jarred M.D.   On: 07/21/2018 00:45   Ct Head Code Stroke Wo Contrast  Result Date: 07/20/2018 CLINICAL DATA:  Code stroke.  Combative with confusion. Hypertensive patient with RIGHT arm weakness. EXAM: CT HEAD WITHOUT CONTRAST TECHNIQUE: Contiguous axial images were obtained from the base of the skull through the vertex without intravenous contrast. COMPARISON:  01/10/2018. FINDINGS: Motion degraded scan.  Overall study diagnostic. Brain: There is a 16 x 15 x 22 mm acute hemorrhage, approximate volume 3 mL, involving the LEFT lateral superior thalamus extending into the periventricular/posterior lentiform nucleus region.  Mild surrounding edema. No midline shift. No intraventricular or subarachnoid extension. Premature for age atrophy. Chronic microvascular ischemic change of the periventricular and subcortical white matter. Vascular: Calcification of the cavernous internal carotid arteries consistent with cerebrovascular atherosclerotic disease. No signs of intracranial large vessel occlusion. Skull: Calvarium intact. Sinuses/Orbits: No significant sinus disease. Negative orbits. Other: None. Compared with 01/10/2018, the degree of atrophy is similar. The bleed is acute. ASPECTS [facility] Stroke Program Early CT Score) Not applicable due to bleed. IMPRESSION: 1. 16 x 15 x 22 mm acute hemorrhage involving the LEFT lateral superior thalamus extending into the periventricular/posterior lentiform nucleus region. Mild surrounding edema. No midline shift. Hypertensive etiology is likely. 2. ASPECTS is not applicable due to bleed. 3. Critical Value/emergent results were called by telephone at the time of interpretation on 07/20/2018 at 4:28 pm to Dr. Amie Portland , who verbally acknowledged these results. * Electronically Signed   By: Staci Righter M.D.   On: 07/20/2018 16:33    PHYSICAL EXAM  Temp:  [97.6 F (36.4 C)-99.1 F (37.3 C)] 99.1 F (37.3 C) (01/03 0900) Pulse Rate:  [47-121] 67 (01/03 1000) Resp:  [20-40] 31 (01/03 1000) BP: (102-242)/(51-118) 119/55 (01/03 1000) SpO2:  [78 %-100 %] 97 % (01/03 1000) Weight:  [92.1 kg] 92.1 kg (01/02 2000)  General - Well nourished, well developed, intermittent agitation and insisted going home today and did not want to hear any explanation.  Ophthalmologic - fundi not visualized due to noncooperation.  Cardiovascular - Regular rate and rhythm.  Mental Status -  Level of arousal and orientation to time, place, and person were intact. Language including expression, naming, repetition, comprehension was assessed and found intact. However, moderate to severe  dysarthria  Cranial Nerves II - XII - II - Visual field intact OU. III, IV, VI - Extraocular movements intact. V - Facial sensation intact bilaterally. VII - right facial droop. VIII - Hearing & vestibular intact bilaterally. X - Palate elevates symmetrically, moderate to severe dysarthria. XI - Chin turning & shoulder shrug intact bilaterally. XII - Tongue protrusion intact.  Motor Strength - The patient's strength showed LUE 3/5 and LLE 4/5. RUE 2+/5 and LLE 3-/5.  Bulk was normal and fasciculations were absent.   Motor Tone - Muscle tone was assessed at the neck and appendages and was normal.  Reflexes - The patient's reflexes were symmetrical in all extremities and he had no pathological reflexes.  Sensory - Light touch, temperature/pinprick were assessed and were symmetrical.    Coordination - not cooperative on exam.  Tremor was absent.  Gait and Station - deferred.   ASSESSMENT/PLAN [name] is a 68 y.o. male with history of right pontine infarct in 05/2015, smoker, hypertension, hyperlipidemia, hep C admitted for worsening slurred speech and right-sided facial droop. No tPA given due to [place].    ICH:  left BG/CR ICH, secondary to uncontrolled hypertension and smoking  Resultant right-sided weakness more than left sided weakness  CT head left BG/CR ICH  Repeat CT head showed stable BG/CR ICH on the left  CT head and neck pending  2D Echo EF 65 to 70%  LDL pending  HgbA1c pending  UDS positive for THC  SCDs for VTE prophylaxis  aspirin 325 mg daily prior to admission, now on No antithrombotic due to [place]  Patient counseled to be compliant with his antithrombotic medications  Ongoing aggressive stroke risk factor management  Therapy recommendations: CIR  Disposition: Pending  History of stroke  05/2015, admitted for left-sided weakness, MRI showed right pontine infarct.  TCD diffuse atherosclerosis.  Carotid Doppler negative.  EF 60 to 65%.   LDL 40 and A1c 5.8.  He was discharged with aspirin 325.  Has residual mild left-sided weakness.  Hypertension . Stable with high dose of cleviprex . Did not pass swallow yet . Start po BP meds once po access . BP goal < 160  Long term BP goal normotensive  Tobacco abuse  Current smoker  Smoking cessation counseling provided  Pt is willing to quit  ?? Alcohol withdraw  Intermittent agitation, insisting going home  Denies heavy alcohol use, but stated that seldomly drink beer for celebration  On Precedex  Consider vitamin B1/multivitamin/folic acid once p.o. access  Other Stroke Risk Factors  Advanced age  Obesity, Body mass index is 29.98 kg/m.   THC use  Other Active Problems  Hepatitis C  Hospital day # 1  This patient is critically ill due to left BG ICH, history of stroke, uncontrolled hypertension, THC use, questionable alcohol withdrawal and at significant risk of neurological worsening, death form hematoma expansion, cerebral edema, brain herniation, heart failure, DT. This patient's care requires constant monitoring of vital signs, hemodynamics, respiratory and cardiac monitoring, review of multiple databases, neurological assessment, discussion with family, other specialists and medical decision making of high complexity. I spent 40 minutes of neurocritical care time in the care of this patient.   Rosalin Hawking, MD PhD Stroke Neurology 07/21/2018 10:56 AM    To contact Stroke Continuity provider, please refer to [email or web address] After hours, contact General Neurology

## 2018-07-21 NOTE — Progress Notes (Signed)
PT Cancellation Note  Patient Details Name: [name] MRN: [id number] DOB: 1951/01/28   Cancelled Treatment:    Reason Eval/Treat Not Completed: Active bedrest order   [name] 07/21/2018, 7:20 AM  [place] Pager: [phone] Office: [phone]

## 2018-07-21 NOTE — Progress Notes (Signed)
  Echocardiogram 2D Echocardiogram has been performed.  Matilde Bash 07/21/2018, 9:24 AM

## 2018-07-21 NOTE — Progress Notes (Signed)
Rehab Admissions Coordinator Note:  Patient was screened by Retta Diones for appropriateness for an Inpatient Acute Rehab Consult.  At this time, we are recommending Inpatient Rehab consult.  Jodell Cipro M 07/21/2018, 5:59 PM  I can be reached at [phone].

## 2018-07-22 LAB — LIPID PANEL
Cholesterol: 102 mg/dL (ref 0–200)
HDL: 35 mg/dL — ABNORMAL LOW (ref >40)
LDL Cholesterol: 45 mg/dL (ref 0–99)
Total CHOL/HDL Ratio: 2.9 RATIO
Triglycerides: 108 mg/dL (ref <150)
VLDL: 22 mg/dL (ref 0–40)

## 2018-07-22 LAB — HEMOGLOBIN A1C
Hgb A1c MFr Bld: 4.7 % — ABNORMAL LOW (ref 4.8–5.6)
Mean Plasma Glucose: 88.19 mg/dL

## 2018-07-22 LAB — BASIC METABOLIC PANEL
Anion gap: 8 (ref 5–15)
BUN: 12 mg/dL (ref 8–23)
CO2: 21 mmol/L — ABNORMAL LOW (ref 22–32)
Calcium: 8.9 mg/dL (ref 8.9–10.3)
Chloride: 113 mmol/L — ABNORMAL HIGH (ref 98–111)
Creatinine, Ser: 0.86 mg/dL (ref 0.61–1.24)
GFR calc Af Amer: >60 mL/min (ref >60)
GFR calc non Af Amer: >60 mL/min (ref >60)
Glucose, Bld: 111 mg/dL — ABNORMAL HIGH (ref 70–99)
Potassium: 3.1 mmol/L — ABNORMAL LOW (ref 3.5–5.1)
Sodium: 142 mmol/L (ref 135–145)

## 2018-07-22 LAB — CBC
HCT: 30.2 % — ABNORMAL LOW (ref 39.0–52.0)
Hemoglobin: 8.7 g/dL — ABNORMAL LOW (ref 13.0–17.0)
MCH: 22.3 pg — ABNORMAL LOW (ref 26.0–34.0)
MCHC: 28.8 g/dL — ABNORMAL LOW (ref 30.0–36.0)
MCV: 77.4 fL — ABNORMAL LOW (ref 80.0–100.0)
Platelets: 384 10*3/uL (ref 150–400)
RBC: 3.9 MIL/uL — ABNORMAL LOW (ref 4.22–5.81)
RDW: 18.6 % — ABNORMAL HIGH (ref 11.5–15.5)
WBC: 9.4 10*3/uL (ref 4.0–10.5)
nRBC: 0 % (ref 0.0–0.2)

## 2018-07-22 MED ORDER — METOPROLOL TARTRATE 5 MG/5ML IV SOLN
5.0000 mg | INTRAVENOUS | Status: DC | PRN
  Administered 2018-07-23: 5 mg via INTRAVENOUS
  Filled 2018-07-22: qty 5

## 2018-07-22 MED ORDER — LORAZEPAM 2 MG/ML IJ SOLN
2.0000 mg | Freq: Once | INTRAMUSCULAR | Status: AC
  Administered 2018-07-22: 2 mg via INTRAVENOUS
  Filled 2018-07-22: qty 1

## 2018-07-22 MED ORDER — LABETALOL HCL 5 MG/ML IV SOLN
20.0000 mg | INTRAVENOUS | Status: DC | PRN
  Administered 2018-07-22 – 2018-07-24 (×2): 20 mg via INTRAVENOUS
  Filled 2018-07-22 (×2): qty 4

## 2018-07-22 MED ORDER — HYDRALAZINE HCL 20 MG/ML IJ SOLN
20.0000 mg | Freq: Three times a day (TID) | INTRAMUSCULAR | Status: DC | PRN
  Administered 2018-07-22 – 2018-07-24 (×3): 20 mg via INTRAVENOUS
  Filled 2018-07-22 (×3): qty 1

## 2018-07-22 NOTE — Progress Notes (Signed)
SLP Cancellation Note  Patient Details Name: [name] MRN: [id number] DOB: 1950/08/04   Cancelled treatment:       Reason Eval/Treat Not Completed: Other (comment). Swallow orders received; spoke with RN as no [name] Screen documented. Chart reviewed and pt has since passed [name]. Will s/o for swallow; please reconsult if pt does not tolerate POs.  Deneise Lever, [place], CCC-SLP Speech-Language Pathologist Acute Rehabilitation Services Pager: [phone] Office: [phone]    Aliene Altes 07/22/2018, 2:02 PM

## 2018-07-22 NOTE — Progress Notes (Signed)
Neuro MD paged and clarified pressure goal to <160 on gtt. MD made aware of tachycardia and restlessnes new orders entered.

## 2018-07-22 NOTE — Progress Notes (Signed)
STROKE TEAM PROGRESS NOTE   SUBJECTIVE (INTERVAL HISTORY) His RN is at the bedside.  Overall his condition is stable. He  States he is feeling ill and wants to sit him up in bed.He remains on cleviprex drip for BP control and Precedex for agitation. ST has not seen him yet for swallow  OBJECTIVE Temp:  [97.7 F (36.5 C)-98.6 F (37 C)] 98.3 F (36.8 C) (01/04 0800) Pulse Rate:  [62-88] 65 (01/04 0700) Cardiac Rhythm: Normal sinus rhythm (01/03 2000) Resp:  [15-34] 19 (01/04 0700) BP: (110-155)/(58-120) 127/66 (01/04 0700) SpO2:  [94 %-100 %] 100 % (01/04 0700)  No results for input(s): GLUCAP in the last 168 hours. Recent Labs  Lab 07/20/18 1624 07/20/18 1629 07/22/18 0408  NA 144 146* 142  K 3.6 3.6 3.1*  CL 110 111 113*  CO2 23  --  21*  GLUCOSE 116* 114* 111*  BUN 15 16 12   CREATININE 1.03 1.00 0.86  CALCIUM 9.0  --  8.9   Recent Labs  Lab 07/20/18 1624  AST 24  ALT 17  ALKPHOS 94  BILITOT <0.1*  PROT 7.1  ALBUMIN 4.0   Recent Labs  Lab 07/20/18 1624 07/20/18 1629 07/22/18 0408  WBC 6.8  --  9.4  NEUTROABS 4.5  --   --   HGB 9.0* 10.9* 8.7*  HCT 32.7* 32.0* 30.2*  MCV 80.9  --  77.4*  PLT 421*  --  384   No results for input(s): CKTOTAL, CKMB, CKMBINDEX, TROPONINI in the last 168 hours. Recent Labs    07/20/18 1624  LABPROT 15.4*  INR 1.23   No results for input(s): COLORURINE, LABSPEC, PHURINE, GLUCOSEU, HGBUR, BILIRUBINUR, KETONESUR, PROTEINUR, UROBILINOGEN, NITRITE, LEUKOCYTESUR in the last 72 hours.  Invalid input(s): APPERANCEUR     Component Value Date/Time   CHOL 102 07/22/2018 0408   TRIG 108 07/22/2018 0408   HDL 35 (L) 07/22/2018 0408   CHOLHDL 2.9 07/22/2018 0408   VLDL 22 07/22/2018 0408   LDLCALC 45 07/22/2018 0408   Lab Results  Component Value Date   HGBA1C 4.7 (L) 07/22/2018      Component Value Date/Time   LABOPIA NONE DETECTED 07/20/2018 1638   COCAINSCRNUR NONE DETECTED 07/20/2018 1638   LABBENZ NONE DETECTED  07/20/2018 1638   AMPHETMU NONE DETECTED 07/20/2018 1638   THCU POSITIVE (A) 07/20/2018 1638   LABBARB NONE DETECTED 07/20/2018 1638    Recent Labs  Lab 07/20/18 1746  ETH <10    I have personally reviewed the radiological images below and agree with the radiology interpretations.  Ct Angio Head W Or Wo Contrast  Result Date: 07/21/2018 CLINICAL DATA:  Followup intracranial hemorrhage. EXAM: CT ANGIOGRAPHY HEAD AND NECK TECHNIQUE: Multidetector CT imaging of the head and neck was performed using the standard protocol during bolus administration of intravenous contrast. Multiplanar CT image reconstructions and MIPs were obtained to evaluate the vascular anatomy. Carotid stenosis measurements (when applicable) are obtained utilizing NASCET criteria, using the distal internal carotid diameter as the denominator. CONTRAST:  165mL ISOVUE-370 IOPAMIDOL (ISOVUE-370) INJECTION 76% COMPARISON:  Head CT same day.  MR angiography 06/03/2015. FINDINGS: CTA NECK FINDINGS Aortic arch: Aortic atherosclerosis. No aneurysm or dissection. Branching pattern of the brachiocephalic vessels is normal without origin stenosis. Right carotid system: Common carotid artery shows some atherosclerotic plaque but is widely patent to the bifurcation region. There is soft and calcified plaque at the carotid bifurcation and ICA bulb but there is no stenosis. Cervical ICA is widely patent  beyond that. Left carotid system: Common carotid artery widely patent to the bifurcation. There is soft and calcified plaque at the carotid bifurcation and ICA bulb. There is no stenosis. Cervical ICA widely patent beyond that. Vertebral arteries: Both vertebral artery origins are widely patent. Both vertebral arteries are widely patent through the cervical region to the foramen magnum. Skeleton: Ordinary cervical spondylosis. Other neck: No mass or lymphadenopathy. Upper chest: Mild apical scarring.  No significant or active lesion. Review of the MIP  images confirms the above findings CTA HEAD FINDINGS Anterior circulation: Both internal carotid arteries are patent through the skull base and siphon regions. There is atherosclerotic calcification in the carotid siphon regions but no stenosis. The anterior and middle cerebral vessels are patent without stenosis, aneurysm or vascular malformation. No vascular lesions seen in the region of the left lateral thalamus/posterior limb internal capsule hemorrhage. Posterior circulation: Both vertebral arteries are patent through the foramen magnum. There is atherosclerotic disease in both V4 segments. 25% stenosis on the right. 50% stenosis on the left. Both vertebral arteries reach the basilar. No basilar stenosis. Posterior circulation branch vessels appear normal. Venous sinuses: Patent and normal. Anatomic variants: None significant. Delayed phase: No abnormal enhancement. Review of the MIP images confirms the above findings IMPRESSION: Atherosclerotic disease at both carotid bifurcations and ICA bulb regions. No stenosis. 25% stenosis of the right V4 segment. 50% stenosis of the left V4 segment. No evidence of abnormal vessel in the region of the left lateral thalamus/posterior limb internal capsule hemorrhage. Electronically Signed   By: Nelson Chimes M.D.   On: 07/21/2018 21:42   Ct Head Wo Contrast  Result Date: 07/21/2018 CLINICAL DATA:  Intracranial hemorrhage follow up EXAM: CT HEAD WITHOUT CONTRAST TECHNIQUE: Contiguous axial images were obtained from the base of the skull through the vertex without intravenous contrast. COMPARISON:  07/20/2018 FINDINGS: Brain: Unchanged hemorrhage within the left thalamus. Mild surrounding edema. The size and configuration of the ventricles and extra-axial CSF spaces are normal. There is hypoattenuation of the periventricular white matter, most commonly indicating chronic ischemic microangiopathy. Vascular: No abnormal hyperdensity of the major intracranial arteries or  dural venous sinuses. No intracranial atherosclerosis. Skull: The visualized skull base, calvarium and extracranial soft tissues are normal. Sinuses/Orbits: No fluid levels or advanced mucosal thickening of the visualized paranasal sinuses. No mastoid or middle ear effusion. The orbits are normal. IMPRESSION: Unchanged hemorrhage within the left thalamus with mild surrounding edema. Electronically Signed   By: Ulyses Jarred M.D.   On: 07/21/2018 00:45   Ct Angio Neck W Or Wo Contrast  Result Date: 07/21/2018 CLINICAL DATA:  Followup intracranial hemorrhage. EXAM: CT ANGIOGRAPHY HEAD AND NECK TECHNIQUE: Multidetector CT imaging of the head and neck was performed using the standard protocol during bolus administration of intravenous contrast. Multiplanar CT image reconstructions and MIPs were obtained to evaluate the vascular anatomy. Carotid stenosis measurements (when applicable) are obtained utilizing NASCET criteria, using the distal internal carotid diameter as the denominator. CONTRAST:  126mL ISOVUE-370 IOPAMIDOL (ISOVUE-370) INJECTION 76% COMPARISON:  Head CT same day.  MR angiography 06/03/2015. FINDINGS: CTA NECK FINDINGS Aortic arch: Aortic atherosclerosis. No aneurysm or dissection. Branching pattern of the brachiocephalic vessels is normal without origin stenosis. Right carotid system: Common carotid artery shows some atherosclerotic plaque but is widely patent to the bifurcation region. There is soft and calcified plaque at the carotid bifurcation and ICA bulb but there is no stenosis. Cervical ICA is widely patent beyond that. Left carotid system: Common carotid  artery widely patent to the bifurcation. There is soft and calcified plaque at the carotid bifurcation and ICA bulb. There is no stenosis. Cervical ICA widely patent beyond that. Vertebral arteries: Both vertebral artery origins are widely patent. Both vertebral arteries are widely patent through the cervical region to the foramen magnum.  Skeleton: Ordinary cervical spondylosis. Other neck: No mass or lymphadenopathy. Upper chest: Mild apical scarring.  No significant or active lesion. Review of the MIP images confirms the above findings CTA HEAD FINDINGS Anterior circulation: Both internal carotid arteries are patent through the skull base and siphon regions. There is atherosclerotic calcification in the carotid siphon regions but no stenosis. The anterior and middle cerebral vessels are patent without stenosis, aneurysm or vascular malformation. No vascular lesions seen in the region of the left lateral thalamus/posterior limb internal capsule hemorrhage. Posterior circulation: Both vertebral arteries are patent through the foramen magnum. There is atherosclerotic disease in both V4 segments. 25% stenosis on the right. 50% stenosis on the left. Both vertebral arteries reach the basilar. No basilar stenosis. Posterior circulation branch vessels appear normal. Venous sinuses: Patent and normal. Anatomic variants: None significant. Delayed phase: No abnormal enhancement. Review of the MIP images confirms the above findings IMPRESSION: Atherosclerotic disease at both carotid bifurcations and ICA bulb regions. No stenosis. 25% stenosis of the right V4 segment. 50% stenosis of the left V4 segment. No evidence of abnormal vessel in the region of the left lateral thalamus/posterior limb internal capsule hemorrhage. Electronically Signed   By: Nelson Chimes M.D.   On: 07/21/2018 21:42   Ct Head Code Stroke Wo Contrast  Result Date: 07/20/2018 CLINICAL DATA:  Code stroke. Combative with confusion. Hypertensive patient with RIGHT arm weakness. EXAM: CT HEAD WITHOUT CONTRAST TECHNIQUE: Contiguous axial images were obtained from the base of the skull through the vertex without intravenous contrast. COMPARISON:  01/10/2018. FINDINGS: Motion degraded scan.  Overall study diagnostic. Brain: There is a 16 x 15 x 22 mm acute hemorrhage, approximate volume 3 mL,  involving the LEFT lateral superior thalamus extending into the periventricular/posterior lentiform nucleus region. Mild surrounding edema. No midline shift. No intraventricular or subarachnoid extension. Premature for age atrophy. Chronic microvascular ischemic change of the periventricular and subcortical white matter. Vascular: Calcification of the cavernous internal carotid arteries consistent with cerebrovascular atherosclerotic disease. No signs of intracranial large vessel occlusion. Skull: Calvarium intact. Sinuses/Orbits: No significant sinus disease. Negative orbits. Other: None. Compared with 01/10/2018, the degree of atrophy is similar. The bleed is acute. ASPECTS [facility] Stroke Program Early CT Score) Not applicable due to bleed. IMPRESSION: 1. 16 x 15 x 22 mm acute hemorrhage involving the LEFT lateral superior thalamus extending into the periventricular/posterior lentiform nucleus region. Mild surrounding edema. No midline shift. Hypertensive etiology is likely. 2. ASPECTS is not applicable due to bleed. 3. Critical Value/emergent results were called by telephone at the time of interpretation on 07/20/2018 at 4:28 pm to Dr. Amie [place] , who verbally acknowledged these results. * Electronically Signed   By: Staci Righter M.D.   On: 07/20/2018 16:33    PHYSICAL EXAM  Temp:  [97.7 F (36.5 C)-98.6 F (37 C)] 98.3 F (36.8 C) (01/04 0800) Pulse Rate:  [62-88] 65 (01/04 0700) Resp:  [15-34] 19 (01/04 0700) BP: (110-155)/(58-120) 127/66 (01/04 0700) SpO2:  [94 %-100 %] 100 % (01/04 0700)  General - Well nourished, well developed,  Elderly african [place] male not in distress but in restraints due to agitation Ophthalmologic - fundi not visualized due  to noncooperation.  Cardiovascular - Regular rate and rhythm.  Mental Status -  Level of arousal and orientation to time, place, and person were intact. Language including expression, naming, repetition, comprehension was assessed and  found intact. However, moderate to severe dysarthria  Cranial Nerves II - XII - II - Visual field intact OU. III, IV, VI - Extraocular movements intact. V - Facial sensation intact bilaterally. VII - right facial droop. VIII - Hearing & vestibular intact bilaterally. X - Palate elevates symmetrically, moderate to severe dysarthria. XI - Chin turning & shoulder shrug intact bilaterally. XII - Tongue protrusion intact.  Motor Strength - The patient's strength showed LUE 3/5 and LLE 4/5. RUE 2+/5 and LLE 3-/5.  Bulk was normal and fasciculations were absent.   Motor Tone - Muscle tone was assessed at the neck and appendages and was normal.  Reflexes - The patient's reflexes were symmetrical in all extremities and he had no pathological reflexes.  Sensory - Light touch, temperature/pinprick were assessed and were symmetrical.    Coordination - not cooperative on exam.  Tremor was absent.  Gait and Station - deferred.   ASSESSMENT/PLAN Mr. Kelii Chittum is a 68 y.o. male with history of right pontine infarct in 05/2015, smoker, hypertension, hyperlipidemia, hep C admitted for worsening slurred speech and right-sided facial droop. No tPA given due to [place].    ICH:  left BG/CR ICH, secondary to uncontrolled hypertension and smoking  Resultant right-sided weakness more than left sided weakness  CT head left BG/CR ICH  Repeat CT head showed stable BG/CR ICH on the left CT head and neck Atherosclerotic disease at both carotid bifurcations and ICA bulb regions. No stenosis.25% stenosis of the right V4 segment. 50% stenosis of the left V4 segment.No evidence of abnormal vessel in the region of the left lateral thalamus/posterior limb internal capsule hemorrhage.  2D Echo EF 65 to 70%  LDL 45 mg %  HgbA1c 4.7  UDS positive for THC  SCDs for VTE prophylaxis  aspirin 325 mg daily prior to admission, now on No antithrombotic due to [place]  Patient counseled to be compliant with  his antithrombotic medications  Ongoing aggressive stroke risk factor management  Therapy recommendations: CIR  Disposition: Pending  History of stroke  05/2015, admitted for left-sided weakness, MRI showed right pontine infarct.  TCD diffuse atherosclerosis.  Carotid Doppler negative.  EF 60 to 65%.  LDL 40 and A1c 5.8.  He was discharged with aspirin 325.  Has residual mild left-sided weakness.  Hypertension . Stable with high dose of cleviprex . Did not pass swallow yet . Start po BP meds once po access . BP goal < 160  Long term BP goal normotensive  Tobacco abuse  Current smoker  Smoking cessation counseling provided  Pt is willing to quit  ?? Alcohol withdraw  Intermittent agitation, insisting going home  Denies heavy alcohol use, but stated that seldomly drink beer for celebration  On Precedex  Consider vitamin B1/multivitamin/folic acid once p.o. access  Other Stroke Risk Factors  Advanced age  Obesity, Body mass index is 29.98 kg/m.   THC use  Other Active Problems  Hepatitis C  Hospital day # 2 Plan mobilize out of bed. ST swallow eval and if he passes start PO BP meds. Add prn labetalol and hydralazine and taper Cardene drip and Precedex This patient is critically ill due to left BG ICH, history of stroke, uncontrolled hypertension, THC use, questionable alcohol withdrawal and at significant risk of  neurological worsening, death form hematoma expansion, cerebral edema, brain herniation, heart failure, DT. This patient's care requires constant monitoring of vital signs, hemodynamics, respiratory and cardiac monitoring, review of multiple databases, neurological assessment, discussion with family, other specialists and medical decision making of high complexity. I spent 40 minutes of neurocritical care time in the care of this patient.   Antony Contras, MD Stroke Neurology 07/22/2018 10:56 AM    To contact Stroke Continuity provider, please refer to  [email or web address] After hours, contact General Neurology

## 2018-07-23 DIAGNOSIS — I69398 Other sequelae of cerebral infarction: Secondary | ICD-10-CM

## 2018-07-23 DIAGNOSIS — I619 Nontraumatic intracerebral hemorrhage, unspecified: Secondary | ICD-10-CM

## 2018-07-23 DIAGNOSIS — I69319 Unspecified symptoms and signs involving cognitive functions following cerebral infarction: Secondary | ICD-10-CM

## 2018-07-23 DIAGNOSIS — R269 Unspecified abnormalities of gait and mobility: Secondary | ICD-10-CM

## 2018-07-23 LAB — BASIC METABOLIC PANEL
Anion gap: 10 (ref 5–15)
Anion gap: 11 (ref 5–15)
BUN: 13 mg/dL (ref 8–23)
BUN: 18 mg/dL (ref 8–23)
CO2: 19 mmol/L — ABNORMAL LOW (ref 22–32)
CO2: 19 mmol/L — ABNORMAL LOW (ref 22–32)
Calcium: 8.9 mg/dL (ref 8.9–10.3)
Calcium: 8.6 mg/dL — ABNORMAL LOW (ref 8.9–10.3)
Chloride: 112 mmol/L — ABNORMAL HIGH (ref 98–111)
Chloride: 110 mmol/L (ref 98–111)
Creatinine, Ser: 0.96 mg/dL (ref 0.61–1.24)
Creatinine, Ser: 1.17 mg/dL (ref 0.61–1.24)
GFR calc Af Amer: >60 mL/min (ref >60)
GFR calc Af Amer: >60 mL/min (ref >60)
GFR calc non Af Amer: >60 mL/min (ref >60)
GFR calc non Af Amer: >60 mL/min (ref >60)
Glucose, Bld: 138 mg/dL — ABNORMAL HIGH (ref 70–99)
Glucose, Bld: 122 mg/dL — ABNORMAL HIGH (ref 70–99)
Potassium: 2.8 mmol/L — ABNORMAL LOW (ref 3.5–5.1)
Potassium: 3.4 mmol/L — ABNORMAL LOW (ref 3.5–5.1)
Sodium: 141 mmol/L (ref 135–145)
Sodium: 140 mmol/L (ref 135–145)

## 2018-07-23 LAB — CBC
HCT: 32.5 % — ABNORMAL LOW (ref 39.0–52.0)
Hemoglobin: 9.4 g/dL — ABNORMAL LOW (ref 13.0–17.0)
MCH: 22.1 pg — ABNORMAL LOW (ref 26.0–34.0)
MCHC: 28.9 g/dL — ABNORMAL LOW (ref 30.0–36.0)
MCV: 76.5 fL — ABNORMAL LOW (ref 80.0–100.0)
Platelets: 430 10*3/uL — ABNORMAL HIGH (ref 150–400)
RBC: 4.25 MIL/uL (ref 4.22–5.81)
RDW: 18.8 % — ABNORMAL HIGH (ref 11.5–15.5)
WBC: 11.3 10*3/uL — ABNORMAL HIGH (ref 4.0–10.5)
nRBC: 0 % (ref 0.0–0.2)

## 2018-07-23 MED ORDER — QUETIAPINE FUMARATE 25 MG PO TABS
12.5000 mg | ORAL_TABLET | Freq: Two times a day (BID) | ORAL | Status: DC
  Administered 2018-07-23 – 2018-07-26 (×7): 12.5 mg via ORAL
  Filled 2018-07-23 (×8): qty 1

## 2018-07-23 MED ORDER — DIPHENHYDRAMINE HCL 25 MG PO CAPS
50.0000 mg | ORAL_CAPSULE | Freq: Once | ORAL | Status: AC
  Administered 2018-07-23: 50 mg via ORAL
  Filled 2018-07-23: qty 2

## 2018-07-23 MED ORDER — AMLODIPINE BESYLATE 10 MG PO TABS
10.0000 mg | ORAL_TABLET | Freq: Every day | ORAL | Status: DC
  Administered 2018-07-23 – 2018-07-26 (×4): 10 mg via ORAL
  Filled 2018-07-23 (×4): qty 1

## 2018-07-23 MED ORDER — PANTOPRAZOLE SODIUM 40 MG PO TBEC
40.0000 mg | DELAYED_RELEASE_TABLET | Freq: Every day | ORAL | Status: DC
  Administered 2018-07-23 – 2018-07-25 (×3): 40 mg via ORAL
  Filled 2018-07-23 (×3): qty 1

## 2018-07-23 MED ORDER — SODIUM CHLORIDE 0.9 % IV SOLN
INTRAVENOUS | Status: DC | PRN
  Administered 2018-07-23: 250 mL via INTRAVENOUS

## 2018-07-23 MED ORDER — POTASSIUM CHLORIDE 10 MEQ/100ML IV SOLN
10.0000 meq | INTRAVENOUS | Status: AC
  Administered 2018-07-23 (×2): 10 meq via INTRAVENOUS
  Filled 2018-07-23 (×2): qty 100

## 2018-07-23 MED ORDER — LISINOPRIL 20 MG PO TABS: 40.0000 mg | ORAL_TABLET | Freq: Every day | ORAL | Status: DC

## 2018-07-23 MED ORDER — LISINOPRIL 20 MG PO TABS
40.0000 mg | ORAL_TABLET | Freq: Every day | ORAL | Status: DC
  Administered 2018-07-23 – 2018-07-26 (×4): 40 mg via ORAL
  Filled 2018-07-23 (×4): qty 2

## 2018-07-23 NOTE — Progress Notes (Signed)
Pt complains of lack of sleep. See MAR.

## 2018-07-23 NOTE — Progress Notes (Addendum)
STROKE TEAM PROGRESS NOTE   SUBJECTIVE (INTERVAL HISTORY) His RN is at the bedside.  Overall his condition is stable. He remains on Cleviprex, with goal to wean   He appears to be sightly agitated this morning and staff has placed mittens on him this morning secondary to his aggression and behavorial concerns that could impair his safety. He did use profanity at the bedside today during his assessment and was uncooperative at times  in this case we have ok'd the use of restraints and scheduled Seroquel.   OBJECTIVE Temp:  [97.8 F (36.6 C)-99.1 F (37.3 C)] 98.5 F (36.9 C) (01/05 1200) Pulse Rate:  [31-133] 95 (01/05 1215) Cardiac Rhythm: Sinus tachycardia (01/05 0800) Resp:  [18-49] 28 (01/05 1215) BP: (102-205)/(64-149) 158/91 (01/05 1215) SpO2:  [70 %-100 %] 98 % (01/05 1215)  No results for input(s): GLUCAP in the last 168 hours. Recent Labs  Lab 07/20/18 1624 07/20/18 1629 07/22/18 0408 07/23/18 0314  NA 144 146* 142 141  K 3.6 3.6 3.1* 2.8*  CL 110 111 113* 112*  CO2 23  --  21* 19*  GLUCOSE 116* 114* 111* 138*  BUN 15 16 12 13   CREATININE 1.03 1.00 0.86 0.96  CALCIUM 9.0  --  8.9 8.9   Recent Labs  Lab 07/20/18 1624  AST 24  ALT 17  ALKPHOS 94  BILITOT <0.1*  PROT 7.1  ALBUMIN 4.0   Recent Labs  Lab 07/20/18 1624 07/20/18 1629 07/22/18 0408 07/23/18 0314  WBC 6.8  --  9.4 11.3*  NEUTROABS 4.5  --   --   --   HGB 9.0* 10.9* 8.7* 9.4*  HCT 32.7* 32.0* 30.2* 32.5*  MCV 80.9  --  77.4* 76.5*  PLT 421*  --  384 430*   No results for input(s): CKTOTAL, CKMB, CKMBINDEX, TROPONINI in the last 168 hours. Recent Labs    07/20/18 1624  LABPROT 15.4*  INR 1.23   No results for input(s): COLORURINE, LABSPEC, PHURINE, GLUCOSEU, HGBUR, BILIRUBINUR, KETONESUR, PROTEINUR, UROBILINOGEN, NITRITE, LEUKOCYTESUR in the last 72 hours.  Invalid input(s): APPERANCEUR     Component Value Date/Time   CHOL 102 07/22/2018 0408   TRIG 108 07/22/2018 0408   HDL 35 (L)  07/22/2018 0408   CHOLHDL 2.9 07/22/2018 0408   VLDL 22 07/22/2018 0408   LDLCALC 45 07/22/2018 0408   Lab Results  Component Value Date   HGBA1C 4.7 (L) 07/22/2018      Component Value Date/Time   LABOPIA NONE DETECTED 07/20/2018 1638   COCAINSCRNUR NONE DETECTED 07/20/2018 1638   LABBENZ NONE DETECTED 07/20/2018 1638   AMPHETMU NONE DETECTED 07/20/2018 1638   THCU POSITIVE (A) 07/20/2018 1638   LABBARB NONE DETECTED 07/20/2018 1638    Recent Labs  Lab 07/20/18 1746  ETH <10    I have personally reviewed the radiological images below and agree with the radiology interpretations.  Ct Angio Head W Or Wo Contrast  Result Date: 07/21/2018 CLINICAL DATA:  Followup intracranial hemorrhage. EXAM: CT ANGIOGRAPHY HEAD AND NECK TECHNIQUE: Multidetector CT imaging of the head and neck was performed using the standard protocol during bolus administration of intravenous contrast. Multiplanar CT image reconstructions and MIPs were obtained to evaluate the vascular anatomy. Carotid stenosis measurements (when applicable) are obtained utilizing NASCET criteria, using the distal internal carotid diameter as the denominator. CONTRAST:  138mL ISOVUE-370 IOPAMIDOL (ISOVUE-370) INJECTION 76% COMPARISON:  Head CT same day.  MR angiography 06/03/2015. FINDINGS: CTA NECK FINDINGS Aortic arch: Aortic atherosclerosis. No  aneurysm or dissection. Branching pattern of the brachiocephalic vessels is normal without origin stenosis. Right carotid system: Common carotid artery shows some atherosclerotic plaque but is widely patent to the bifurcation region. There is soft and calcified plaque at the carotid bifurcation and ICA bulb but there is no stenosis. Cervical ICA is widely patent beyond that. Left carotid system: Common carotid artery widely patent to the bifurcation. There is soft and calcified plaque at the carotid bifurcation and ICA bulb. There is no stenosis. Cervical ICA widely patent beyond that. Vertebral  arteries: Both vertebral artery origins are widely patent. Both vertebral arteries are widely patent through the cervical region to the foramen magnum. Skeleton: Ordinary cervical spondylosis. Other neck: No mass or lymphadenopathy. Upper chest: Mild apical scarring.  No significant or active lesion. Review of the MIP images confirms the above findings CTA HEAD FINDINGS Anterior circulation: Both internal carotid arteries are patent through the skull base and siphon regions. There is atherosclerotic calcification in the carotid siphon regions but no stenosis. The anterior and middle cerebral vessels are patent without stenosis, aneurysm or vascular malformation. No vascular lesions seen in the region of the left lateral thalamus/posterior limb internal capsule hemorrhage. Posterior circulation: Both vertebral arteries are patent through the foramen magnum. There is atherosclerotic disease in both V4 segments. 25% stenosis on the right. 50% stenosis on the left. Both vertebral arteries reach the basilar. No basilar stenosis. Posterior circulation branch vessels appear normal. Venous sinuses: Patent and normal. Anatomic variants: None significant. Delayed phase: No abnormal enhancement. Review of the MIP images confirms the above findings IMPRESSION: Atherosclerotic disease at both carotid bifurcations and ICA bulb regions. No stenosis. 25% stenosis of the right V4 segment. 50% stenosis of the left V4 segment. No evidence of abnormal vessel in the region of the left lateral thalamus/posterior limb internal capsule hemorrhage. Electronically Signed   By: Nelson Chimes M.D.   On: 07/21/2018 21:42   Ct Head Wo Contrast  Result Date: 07/21/2018 CLINICAL DATA:  Intracranial hemorrhage follow up EXAM: CT HEAD WITHOUT CONTRAST TECHNIQUE: Contiguous axial images were obtained from the base of the skull through the vertex without intravenous contrast. COMPARISON:  07/20/2018 FINDINGS: Brain: Unchanged hemorrhage within the  left thalamus. Mild surrounding edema. The size and configuration of the ventricles and extra-axial CSF spaces are normal. There is hypoattenuation of the periventricular white matter, most commonly indicating chronic ischemic microangiopathy. Vascular: No abnormal hyperdensity of the major intracranial arteries or dural venous sinuses. No intracranial atherosclerosis. Skull: The visualized skull base, calvarium and extracranial soft tissues are normal. Sinuses/Orbits: No fluid levels or advanced mucosal thickening of the visualized paranasal sinuses. No mastoid or middle ear effusion. The orbits are normal. IMPRESSION: Unchanged hemorrhage within the left thalamus with mild surrounding edema. Electronically Signed   By: Ulyses Jarred M.D.   On: 07/21/2018 00:45   Ct Angio Neck W Or Wo Contrast  Result Date: 07/21/2018 CLINICAL DATA:  Followup intracranial hemorrhage. EXAM: CT ANGIOGRAPHY HEAD AND NECK TECHNIQUE: Multidetector CT imaging of the head and neck was performed using the standard protocol during bolus administration of intravenous contrast. Multiplanar CT image reconstructions and MIPs were obtained to evaluate the vascular anatomy. Carotid stenosis measurements (when applicable) are obtained utilizing NASCET criteria, using the distal internal carotid diameter as the denominator. CONTRAST:  136mL ISOVUE-370 IOPAMIDOL (ISOVUE-370) INJECTION 76% COMPARISON:  Head CT same day.  MR angiography 06/03/2015. FINDINGS: CTA NECK FINDINGS Aortic arch: Aortic atherosclerosis. No aneurysm or dissection. Branching pattern of the  brachiocephalic vessels is normal without origin stenosis. Right carotid system: Common carotid artery shows some atherosclerotic plaque but is widely patent to the bifurcation region. There is soft and calcified plaque at the carotid bifurcation and ICA bulb but there is no stenosis. Cervical ICA is widely patent beyond that. Left carotid system: Common carotid artery widely patent to the  bifurcation. There is soft and calcified plaque at the carotid bifurcation and ICA bulb. There is no stenosis. Cervical ICA widely patent beyond that. Vertebral arteries: Both vertebral artery origins are widely patent. Both vertebral arteries are widely patent through the cervical region to the foramen magnum. Skeleton: Ordinary cervical spondylosis. Other neck: No mass or lymphadenopathy. Upper chest: Mild apical scarring.  No significant or active lesion. Review of the MIP images confirms the above findings CTA HEAD FINDINGS Anterior circulation: Both internal carotid arteries are patent through the skull base and siphon regions. There is atherosclerotic calcification in the carotid siphon regions but no stenosis. The anterior and middle cerebral vessels are patent without stenosis, aneurysm or vascular malformation. No vascular lesions seen in the region of the left lateral thalamus/posterior limb internal capsule hemorrhage. Posterior circulation: Both vertebral arteries are patent through the foramen magnum. There is atherosclerotic disease in both V4 segments. 25% stenosis on the right. 50% stenosis on the left. Both vertebral arteries reach the basilar. No basilar stenosis. Posterior circulation branch vessels appear normal. Venous sinuses: Patent and normal. Anatomic variants: None significant. Delayed phase: No abnormal enhancement. Review of the MIP images confirms the above findings IMPRESSION: Atherosclerotic disease at both carotid bifurcations and ICA bulb regions. No stenosis. 25% stenosis of the right V4 segment. 50% stenosis of the left V4 segment. No evidence of abnormal vessel in the region of the left lateral thalamus/posterior limb internal capsule hemorrhage. Electronically Signed   By: Nelson Chimes M.D.   On: 07/21/2018 21:42   Ct Head Code Stroke Wo Contrast  Result Date: 07/20/2018 CLINICAL DATA:  Code stroke. Combative with confusion. Hypertensive patient with RIGHT arm weakness. EXAM:  CT HEAD WITHOUT CONTRAST TECHNIQUE: Contiguous axial images were obtained from the base of the skull through the vertex without intravenous contrast. COMPARISON:  01/10/2018. FINDINGS: Motion degraded scan.  Overall study diagnostic. Brain: There is a 16 x 15 x 22 mm acute hemorrhage, approximate volume 3 mL, involving the LEFT lateral superior thalamus extending into the periventricular/posterior lentiform nucleus region. Mild surrounding edema. No midline shift. No intraventricular or subarachnoid extension. Premature for age atrophy. Chronic microvascular ischemic change of the periventricular and subcortical white matter. Vascular: Calcification of the cavernous internal carotid arteries consistent with cerebrovascular atherosclerotic disease. No signs of intracranial large vessel occlusion. Skull: Calvarium intact. Sinuses/Orbits: No significant sinus disease. Negative orbits. Other: None. Compared with 01/10/2018, the degree of atrophy is similar. The bleed is acute. ASPECTS [facility] Stroke Program Early CT Score) Not applicable due to bleed. IMPRESSION: 1. 16 x 15 x 22 mm acute hemorrhage involving the LEFT lateral superior thalamus extending into the periventricular/posterior lentiform nucleus region. Mild surrounding edema. No midline shift. Hypertensive etiology is likely. 2. ASPECTS is not applicable due to bleed. 3. Critical Value/emergent results were called by telephone at the time of interpretation on 07/20/2018 at 4:28 pm to Dr. Amie [place] , who verbally acknowledged these results. * Electronically Signed   By: Staci Righter M.D.   On: 07/20/2018 16:33    PHYSICAL EXAM  Temp:  [97.8 F (36.6 C)-99.1 F (37.3 C)] 98.5 F (36.9 C) (01/05  1200) Pulse Rate:  [31-133] 95 (01/05 1215) Resp:  [18-49] 28 (01/05 1215) BP: (102-205)/(64-149) 158/91 (01/05 1215) SpO2:  [70 %-100 %] 98 % (01/05 1215)  General - Well nourished, well developed,  Elderly african [place] male not in distress but in  restraints due to agitation Ophthalmologic - fundi not visualized due to noncooperation.  Cardiovascular - Regular rate and rhythm.  Mental Status -  Level of arousal and orientation to time, place, and person were intact. Language including expression, naming, repetition, comprehension was assessed and found intact. However, moderate to severe dysarthria  Cranial Nerves II - XII - II - Visual field intact OU. III, IV, VI - Extraocular movements intact. V - Facial sensation intact bilaterally. VII - right facial droop. VIII - Hearing & vestibular intact bilaterally. X - Palate elevates symmetrically, moderate to severe dysarthria. XI - Chin turning & shoulder shrug intact bilaterally. XII - Tongue protrusion intact.  Motor Strength - The patient's strength showed LUE 3/5 and LLE 4/5. RUE 2+/5 and LLE 3-/5.  Bulk was normal and fasciculations were absent.   Motor Tone - Muscle tone was assessed at the neck and appendages and was normal.  Reflexes - The patient's reflexes were symmetrical in all extremities and he had no pathological reflexes.  Sensory - Light touch, temperature/pinprick were assessed and were symmetrical.    Coordination - not cooperative on exam.  Tremor was absent.  Gait and Station - deferred.   ASSESSMENT/PLAN Mr. [name] is a 68 y.o. male with history of right pontine infarct in 05/2015, smoker, hypertension, hyperlipidemia, hep C admitted for worsening slurred speech and right-sided facial droop. No tPA given due to [place].    ICH:  left BG/CR ICH, secondary to uncontrolled hypertension and smoking  Resultant right-sided weakness more than left sided weakness  CT head left BG/CR ICH  Repeat CT head showed stable BG/CR ICH on the left CT head and neck Atherosclerotic disease at both carotid bifurcations and ICA bulb regions. No stenosis.25% stenosis of the right V4 segment. 50% stenosis of the left V4 segment.No evidence of abnormal vessel in  the region of the left lateral thalamus/posterior limb internal capsule hemorrhage.  2D Echo EF 65 to 70%  LDL 45 less than goal 70  HgbA1c 4.7  UDS positive for THC  SCDs for VTE prophylaxis  aspirin 325 mg daily prior to admission, now on No antithrombotic due to [place]  Patient counseled to be compliant with his antithrombotic medications  Ongoing aggressive stroke risk factor management  Therapy recommendations: CIR  Disposition: Pending.   History of stroke  05/2015, admitted for left-sided weakness, MRI showed right pontine infarct.  TCD diffuse atherosclerosis.  Carotid Doppler negative.  EF 60 to 65%.  LDL 40 and A1c 5.8.  He was discharged with aspirin 325.  Has residual mild left-sided weakness.  Hypertension . Stable with high dose of cleviprex but needs to be weaned off  .  able to tolerate po had a bedside swallow eval so oral antihypertensive medications started  But staggered to avoid dropping pressures. Home medications: Norvasc 10 mg now, then restart lisinopril, 40 mg po at 1600. HCTZ held off on restarting. May possibly resume tomorrow.   . BP goal < 180   Long term BP goal normotensive  Tobacco abuse  Current smoker  Smoking cessation counseling provided  Pt is willing to quit- may benefit  from patches or assistive aids   ?? Alcohol withdraw  Intermittent agitation, insisting going  home  Denies heavy alcohol use, but stated that seldomly drink beer for celebration  On Precedex  Consider vitamin B1/multivitamin/folic acid once p.o. access  Other Stroke Risk Factors  Advanced age  Obesity, Body mass index is 29.98 kg/m.   THC use  Other Active Problems  Hepatitis C  Hospital day # 3 Plan: continue mobilization and therapy consults. Use prn labetalol and hydralazine and taper Cardene drip  Use Seroquel and restraints for agitation This patient is critically ill due to left BG ICH, history of stroke, uncontrolled hypertension, THC use,  questionable alcohol withdrawal and at significant risk of neurological worsening, death form hematoma expansion, cerebral edema, brain herniation, heart failure, DT. This patient's care requires constant monitoring of vital signs, hemodynamics, respiratory and cardiac monitoring, review of multiple databases, neurological assessment, discussion with family, other specialists and medical decision making of high complexity. I spent 35 minutes of neurocritical care time in the care of this patient.   Antony Contras, MD Medical Director [facility] Pager: [phone] 07/23/2018 3:50 PM 07/23/2018 1:02 PM    To contact Stroke Continuity provider, please refer to [email or web address] After hours, contact General Neurology

## 2018-07-23 NOTE — Consult Note (Signed)
Physical Medicine and Rehabilitation Consult Reason for Consult: Rehabilitation for stroke Referring Phsyician: [name] is an 67 y.o. male.   HPI: Patient with history of hypertension hepatitis C as well as prior stroke residual left-sided weakness presented to the emergency department on 07/20/2018 with worsening speech and right-sided facial droop.  Patient was noted to be agitated during that time.  CT of the head demonstrated left lateral superior thalamic hemorrhage standing into the periventricular and posterior lentiform nucleus region with mild surrounding edema.  NIH stroke scale was 11 Past the Yale swallow screen, speech therapy evaluation was deferred OT evaluation noted to have plus to max assist level.,  Patient was telling therapy that he did not want any extended rehab, max assist upper body ADLs total assist lower body ADLs, transfers mod assist x2 Urine toxicology THC positive Review of Systems -cannot obtain due to confusion/agitation  Past Medical History:  Diagnosis Date  . Arthritis   . [place] ulcer, chronic 01/11/2018  . Chronic back pain   . GERD (gastroesophageal reflux disease)    uses Baking Soda  . GIB (gastrointestinal bleeding) 2012  . Glaucoma    right eye  . Headache    occasionally  . Hepatitis C   . Hiatal hernia with gastroesophageal reflux disease and esophagitis 01/05/2018  . History of blood transfusion    no abnormal reaction noted  . History of gout    doesn't take any meds  . Hyperlipidemia    not on any meds  . Hypertension    takes Amlodipine and Lisinopril daily  . Insomnia    takes Ambien nightly  . Ischemic colitis ([place]) 2012  . Joint pain   . Nocturia   . Numbness    both legs occasionally  . PONV (postoperative nausea and vomiting)   . Prostate cancer ([place])   . Shortness of breath dyspnea    rarely but when notices he can be lying/sitting/exertion.Dr.Hochrein is aware per pt  . Stroke ([place]) 2016   . Urinary  frequency   . Urinary urgency    Past Surgical History:  Procedure Laterality Date  . APPENDECTOMY    . BIOPSY  01/11/2018   Procedure: BIOPSY;  Surgeon: Gatha Mayer, MD;  Location: [facility] ENDOSCOPY;  Service: Endoscopy;;  . COLONOSCOPY    . COLONOSCOPY N/A 10/26/2015   Procedure: COLONOSCOPY;  Surgeon: Doran Stabler, MD;  Location: [facility] ENDOSCOPY;  Service: Endoscopy;  Laterality: N/A;  . ESOPHAGOGASTRODUODENOSCOPY    . ESOPHAGOGASTRODUODENOSCOPY N/A 10/26/2015   Procedure: ESOPHAGOGASTRODUODENOSCOPY (EGD);  Surgeon: Doran Stabler, MD;  Location[facility] END[facility]COPY;  Service: Endoscopy;  Laterality: N/A;  . ESOPHAGOGASTRODUODENOSCOPY (EGD) WITH PROPOFOL N/A 01/11/2018   Procedure: ESOPHAGOGASTRODUODENOSCOPY (EGD) WITH PROPOFOL;  Surgeon: Gatha Mayer, MD;  Location: [facility] ENDOSCOPY;  Service: Endoscopy;  Laterality: N/A;  . INGUINAL HERNIA REPAIR Right 11/04/2014   Procedure: RIGHT INGUINAL HERNIA REPAIR WITH MESH;  Surgeon: Jackolyn Confer, MD;  Location: [place];  Service: General;  Laterality: Right;  . MASS EXCISION N/A 11/04/2014   Procedure: REMOVAL OF RIGHT GROIN SOFT TISSUE MASS;  Surgeon: Jackolyn Confer, MD;  Location: [place];  Service: General;  Laterality: N/A;  . Multiple abdominal surgeries     total of 13  . PROSTATECTOMY    . SMALL INTESTINE SURGERY     Family History  Problem Relation Age of Onset  . Alzheimer's disease Father   . Cancer Mother        Lymph  node  . Heart failure Brother 45       Transplant 13 years ago  . CAD Brother   . Heart disease Sister 37       MI   Social History:  reports that he has been smoking cigarettes. He has a 25.00 pack-year smoking history. He has never used smokeless tobacco. He reports current alcohol use. He reports that he does not use drugs. Allergies:  Allergies  Allergen Reactions  . Penicillins Anaphylaxis    Has patient had a PCN reaction causing immediate rash, facial/tongue/throat swelling, SOB or lightheadedness with  hypotension: Yes Has patient had a PCN reaction causing severe rash involving mucus membranes or skin necrosis: No Has patient had a PCN reaction that required hospitalization: No Has patient had a PCN reaction occurring within the last 10 years: No If all of the above answers are "NO", then may proceed with Cephalosporin use..  . Sudafed [Pseudoephedrine] Other (See Comments)    Heart "races"   Medications Prior to Admission  Medication Sig Dispense Refill  . amLODipine (NORVASC) 10 MG tablet Take 1 tablet (10 mg total) by mouth daily. 90 tablet 1  . bisacodyl (BISACODYL) 5 MG EC tablet TAKE 1 TABLET BY MOUTH AS NEEDED FOR MODERATE CONSTIPATION 30 tablet 0  . cloNIDine (CATAPRES) 0.1 MG tablet Take 3 tablets (0.3 mg total) by mouth 2 (two) times daily. (Patient taking differently: Take 0.1 mg by mouth 2 (two) times daily. ) 60 tablet 0  . Diclofenac Sodium 3 % GEL Place 1 application onto the skin 2 (two) times daily. To affected area. 100 g 0  . DULoxetine (CYMBALTA) 30 MG capsule Take 1 capsule (30 mg total) by mouth 2 (two) times daily. 60 capsule 1  . ferrous sulfate 325 (65 FE) MG tablet TAKE 1 TABLET BY MOUTH THREE TWICE A DAY WITH MEALS 90 tablet 3  . hydrochlorothiazide (MICROZIDE) 12.5 MG capsule Take 2 capsules (25 mg total) by mouth daily. (Patient taking differently: Take 12.5 mg by mouth daily. ) 90 capsule 0  . HYDROcodone-acetaminophen (NORCO/VICODIN) 5-325 MG tablet Take 1 tablet by mouth every 6 (six) hours as needed for moderate pain. 30 tablet 0  . lisinopril (PRINIVIL,ZESTRIL) 40 MG tablet Take 40 mg by mouth daily.    . pantoprazole (PROTONIX) 40 MG tablet TAKE 1 TABLET BY MOUTH TWICE A DAY BEFORE A MEAL 60 tablet 0  . polyethylene glycol (MIRALAX / GLYCOLAX) packet Take 17 g by mouth daily as needed for severe constipation. 14 each 0  . sucralfate (CARAFATE) 1 GM/10ML suspension Take 10 mLs (1 g total) by mouth 4 (four) times daily -  with meals and at bedtime for 15 days.  420 mL 0  . traZODone (DESYREL) 50 MG tablet Take 1 tablet (50 mg total) by mouth at bedtime. 30 tablet 2  . zolpidem (AMBIEN) 10 MG tablet Take 1 tablet (10 mg total) by mouth at bedtime as needed for sleep. 30 tablet 5    Home: Home Living Family/patient expects to be discharged to:: Private residence Living Arrangements: Spouse/significant other Available Help at Discharge: Friend(s), Available 24 hours/day Type of Home: Apartment Home Access: Level entry Home Layout: One level Bathroom Shower/Tub: Multimedia programmer: Handicapped height Home Equipment: Grab bars - tub/shower, Radio producer - single point Additional Comments: Pt lives with girlfriend who cares for a 2 and 4yo during the day  Functional History: Prior Function Comments: pt reports he normally walks with a cane and cares for  himself Functional Status:  Mobility:     Ambulation/Gait General Gait Details: unable at this time    ADL:    Cognition: Cognition Overall Cognitive Status: Impaired/Different from baseline Arousal/Alertness: Awake/alert Orientation Level: Oriented X4 Attention: Sustained Sustained Attention: Appears intact Memory: Impaired Awareness: Impaired Awareness Impairment: Intellectual impairment Problem Solving: Impaired Problem Solving Impairment: Verbal complex, Functional complex Executive Function: Reasoning Behaviors: Restless Cognition Arousal/Alertness: Awake/alert Behavior During Therapy: Agitated Overall Cognitive Status: Impaired/Different from baseline Area of Impairment: Orientation, Attention, Following commands, Problem solving, Awareness, Safety/judgement Orientation Level: Disoriented to, Situation, Time Current Attention Level: Selective Following Commands: Follows one step commands with increased time Safety/Judgement: Decreased awareness of deficits, Decreased awareness of safety Awareness: Intellectual Problem Solving: Slow processing, Decreased initiation,  Difficulty sequencing, Requires verbal cues, Requires tactile cues General Comments: pt unable to move RUE consistently with decreased gross motor and even after transfer states he is going home. "i will crawl to the bathroom" Pt lacks awareness to deficits even with cueing. pt with very flat affect    Blood pressure (!) 166/80, pulse (!) 46, temperature 98.4 F (36.9 C), temperature source Oral, resp. rate (!) 33, weight 92.1 kg, SpO2 94 %. Physical Exam  Nursing note and vitals reviewed. Constitutional: He appears well-developed and well-nourished.  HENT:  Head: Normocephalic and atraumatic.  Eyes: Conjunctivae and EOM are normal. Right eye exhibits no discharge. Left eye exhibits no discharge. No scleral icterus.  Respiratory: Effort normal. No stridor. No respiratory distress.  Neurological: He is alert.  Psychiatric: His affect is inappropriate. He is agitated. Cognition and memory are impaired. He expresses inappropriate judgment.  Patient agitated, refuses to eat in chair.  Complains about wrist restraint  We discussed his need for 2 person assist for his mobility.  He states he can do this at home and does not need therapy.  Patient declines any further examination  Results for orders placed or performed during the hospital encounter of 07/20/18 (from the past 24 hour(s))  CBC     Status: Abnormal   Collection Time: 07/23/18  3:14 AM  Result Value Ref Range   WBC 11.3 (H) 4.0 - 10.5 K/uL   RBC 4.25 4.22 - 5.81 MIL/uL   Hemoglobin 9.4 (L) 13.0 - 17.0 g/dL   HCT 32.5 (L) 39.0 - 52.0 %   MCV 76.5 (L) 80.0 - 100.0 fL   MCH 22.1 (L) 26.0 - 34.0 pg   MCHC 28.9 (L) 30.0 - 36.0 g/dL   RDW 18.8 (H) 11.5 - 15.5 %   Platelets 430 (H) 150 - 400 K/uL   nRBC 0.0 0.0 - 0.2 %  Basic metabolic panel     Status: Abnormal   Collection Time: 07/23/18  3:14 AM  Result Value Ref Range   Sodium 141 135 - 145 mmol/L   Potassium 2.8 (L) 3.5 - 5.1 mmol/L   Chloride 112 (H) 98 - 111 mmol/L   CO2  19 (L) 22 - 32 mmol/L   Glucose, Bld 138 (H) 70 - 99 mg/dL   BUN 13 8 - 23 mg/dL   Creatinine, Ser 0.96 0.61 - 1.24 mg/dL   Calcium 8.9 8.9 - 10.3 mg/dL   GFR calc non Af Amer >60 >60 mL/min   GFR calc Af Amer >60 >60 mL/min   Anion gap 10 5 - 15   Ct Angio Head W Or Wo Contrast  Result Date: 07/21/2018 CLINICAL DATA:  Followup intracranial hemorrhage. EXAM: CT ANGIOGRAPHY HEAD AND NECK TECHNIQUE: Multidetector CT imaging of  the head and neck was performed using the standard protocol during bolus administration of intravenous contrast. Multiplanar CT image reconstructions and MIPs were obtained to evaluate the vascular anatomy. Carotid stenosis measurements (when applicable) are obtained utilizing NASCET criteria, using the distal internal carotid diameter as the denominator. CONTRAST:  154mL ISOVUE-370 IOPAMIDOL (ISOVUE-370) INJECTION 76% COMPARISON:  Head CT same day.  MR angiography 06/03/2015. FINDINGS: CTA NECK FINDINGS Aortic arch: Aortic atherosclerosis. No aneurysm or dissection. Branching pattern of the brachiocephalic vessels is normal without origin stenosis. Right carotid system: Common carotid artery shows some atherosclerotic plaque but is widely patent to the bifurcation region. There is soft and calcified plaque at the carotid bifurcation and ICA bulb but there is no stenosis. Cervical ICA is widely patent beyond that. Left carotid system: Common carotid artery widely patent to the bifurcation. There is soft and calcified plaque at the carotid bifurcation and ICA bulb. There is no stenosis. Cervical ICA widely patent beyond that. Vertebral arteries: Both vertebral artery origins are widely patent. Both vertebral arteries are widely patent through the cervical region to the foramen magnum. Skeleton: Ordinary cervical spondylosis. Other neck: No mass or lymphadenopathy. Upper chest: Mild apical scarring.  No significant or active lesion. Review of the MIP images confirms the above findings  CTA HEAD FINDINGS Anterior circulation: Both internal carotid arteries are patent through the skull base and siphon regions. There is atherosclerotic calcification in the carotid siphon regions but no stenosis. The anterior and middle cerebral vessels are patent without stenosis, aneurysm or vascular malformation. No vascular lesions seen in the region of the left lateral thalamus/posterior limb internal capsule hemorrhage. Posterior circulation: Both vertebral arteries are patent through the foramen magnum. There is atherosclerotic disease in both V4 segments. 25% stenosis on the right. 50% stenosis on the left. Both vertebral arteries reach the basilar. No basilar stenosis. Posterior circulation branch vessels appear normal. Venous sinuses: Patent and normal. Anatomic variants: None significant. Delayed phase: No abnormal enhancement. Review of the MIP images confirms the above findings IMPRESSION: Atherosclerotic disease at both carotid bifurcations and ICA bulb regions. No stenosis. 25% stenosis of the right V4 segment. 50% stenosis of the left V4 segment. No evidence of abnormal vessel in the region of the left lateral thalamus/posterior limb internal capsule hemorrhage. Electronically Signed   By: Nelson Chimes M.D.   On: 07/21/2018 21:42   Ct Angio Neck W Or Wo Contrast  Result Date: 07/21/2018 CLINICAL DATA:  Followup intracranial hemorrhage. EXAM: CT ANGIOGRAPHY HEAD AND NECK TECHNIQUE: Multidetector CT imaging of the head and neck was performed using the standard protocol during bolus administration of intravenous contrast. Multiplanar CT image reconstructions and MIPs were obtained to evaluate the vascular anatomy. Carotid stenosis measurements (when applicable) are obtained utilizing NASCET criteria, using the distal internal carotid diameter as the denominator. CONTRAST:  142mL ISOVUE-370 IOPAMIDOL (ISOVUE-370) INJECTION 76% COMPARISON:  Head CT same day.  MR angiography 06/03/2015. FINDINGS: CTA NECK  FINDINGS Aortic arch: Aortic atherosclerosis. No aneurysm or dissection. Branching pattern of the brachiocephalic vessels is normal without origin stenosis. Right carotid system: Common carotid artery shows some atherosclerotic plaque but is widely patent to the bifurcation region. There is soft and calcified plaque at the carotid bifurcation and ICA bulb but there is no stenosis. Cervical ICA is widely patent beyond that. Left carotid system: Common carotid artery widely patent to the bifurcation. There is soft and calcified plaque at the carotid bifurcation and ICA bulb. There is no stenosis. Cervical ICA widely patent  beyond that. Vertebral arteries: Both vertebral artery origins are widely patent. Both vertebral arteries are widely patent through the cervical region to the foramen magnum. Skeleton: Ordinary cervical spondylosis. Other neck: No mass or lymphadenopathy. Upper chest: Mild apical scarring.  No significant or active lesion. Review of the MIP images confirms the above findings CTA HEAD FINDINGS Anterior circulation: Both internal carotid arteries are patent through the skull base and siphon regions. There is atherosclerotic calcification in the carotid siphon regions but no stenosis. The anterior and middle cerebral vessels are patent without stenosis, aneurysm or vascular malformation. No vascular lesions seen in the region of the left lateral thalamus/posterior limb internal capsule hemorrhage. Posterior circulation: Both vertebral arteries are patent through the foramen magnum. There is atherosclerotic disease in both V4 segments. 25% stenosis on the right. 50% stenosis on the left. Both vertebral arteries reach the basilar. No basilar stenosis. Posterior circulation branch vessels appear normal. Venous sinuses: Patent and normal. Anatomic variants: None significant. Delayed phase: No abnormal enhancement. Review of the MIP images confirms the above findings IMPRESSION: Atherosclerotic disease at  both carotid bifurcations and ICA bulb regions. No stenosis. 25% stenosis of the right V4 segment. 50% stenosis of the left V4 segment. No evidence of abnormal vessel in the region of the left lateral thalamus/posterior limb internal capsule hemorrhage. Electronically Signed   By: Nelson Chimes M.D.   On: 07/21/2018 21:42    Assessment/Plan: Diagnosis: Thalamic intracranial hemorrhage 1. Does the need for close, 24 hr/day medical supervision in concert with the patient's rehab needs make it unreasonable for this patient to be served in a less intensive setting? Potentially 2. Co-Morbidities requiring supervision/potential complications: History of hepatitis C, hypertension 3. Due to bladder management, bowel management, safety, skin/wound care, disease management, medication administration, pain management and patient education, does the patient require 24 hr/day rehab nursing? Potentially 4. Does the patient require coordinated care of a physician, rehab nurse, PT, OT, speech to address physical and functional deficits in the context of the above medical diagnosis(es)? Potentially Addressing deficits in the following areas: balance, endurance, locomotion, strength, transferring, bowel/bladder control, bathing, dressing, feeding, grooming, toileting, cognition, speech, language, swallowing and psychosocial support 5. Can the patient actively participate in an intensive therapy program of at least 3 hrs of therapy per day at least 5 days per week? Potentially 6. The potential for patient to make measurable gains while on inpatient rehab is Currently poor due to poor cooperation 7. Anticipated functional outcomes upon discharge from inpatients are defferred PT, defferred OT, deferredSLP 8. Estimated rehab length of stay to reach the above functional goals is: NA 9. Does the patient have adequate social supports to accommodate these discharge functional goals? Potentially 10. Anticipated D/C setting:  Home 11. Anticipated post D/C treatments: [place] therapy 12. Overall Rehab/Functional Prognosis: fair  RECOMMENDATIONS: This patient's condition is appropriate for continued rehabilitative care in the following setting: Would potentially benefit from comprehensive intensive rehab however not cooperative Patient has agreed to participate in recommended program. No Note that insurance prior authorization may be required for reimbursement for recommended care.  Comment: Agitation related to his stroke may partially explain his behavior however some of his refusal of rehab may be related to premorbid personality   Charlett Blake 07/23/2018

## 2018-07-24 ENCOUNTER — Encounter (HOSPITAL_COMMUNITY): Payer: Self-pay | Admitting: *Deleted

## 2018-07-24 LAB — BASIC METABOLIC PANEL
Anion gap: 9 (ref 5–15)
BUN: 16 mg/dL (ref 8–23)
CO2: 19 mmol/L — ABNORMAL LOW (ref 22–32)
Calcium: 8.6 mg/dL — ABNORMAL LOW (ref 8.9–10.3)
Chloride: 111 mmol/L (ref 98–111)
Creatinine, Ser: 0.97 mg/dL (ref 0.61–1.24)
GFR calc Af Amer: >60 mL/min (ref >60)
GFR calc non Af Amer: >60 mL/min (ref >60)
Glucose, Bld: 118 mg/dL — ABNORMAL HIGH (ref 70–99)
Potassium: 3 mmol/L — ABNORMAL LOW (ref 3.5–5.1)
Sodium: 139 mmol/L (ref 135–145)

## 2018-07-24 LAB — CBC
HCT: 31.1 % — ABNORMAL LOW (ref 39.0–52.0)
Hemoglobin: 8.8 g/dL — ABNORMAL LOW (ref 13.0–17.0)
MCH: 22 pg — ABNORMAL LOW (ref 26.0–34.0)
MCHC: 28.3 g/dL — ABNORMAL LOW (ref 30.0–36.0)
MCV: 77.8 fL — ABNORMAL LOW (ref 80.0–100.0)
Platelets: 321 10*3/uL (ref 150–400)
RBC: 4 MIL/uL — ABNORMAL LOW (ref 4.22–5.81)
RDW: 18.6 % — ABNORMAL HIGH (ref 11.5–15.5)
WBC: 7.6 10*3/uL (ref 4.0–10.5)
nRBC: 0 % (ref 0.0–0.2)

## 2018-07-24 MED ORDER — TRAZODONE HCL 50 MG PO TABS
50.0000 mg | ORAL_TABLET | Freq: Every day | ORAL | Status: DC
  Administered 2018-07-24: 50 mg via ORAL
  Filled 2018-07-24: qty 1

## 2018-07-24 NOTE — Progress Notes (Signed)
Physical Therapy Treatment Patient Details Name: [name] MRN: [id number] DOB: 09/15/1950 Today's Date: 07/24/2018    History of Present Illness 68 yo admitted with slurred speech and Rt facial droop with Left lateral thalamic ICH with HTN, agitation acutely. PMHx; Rt pontine infarct with left weakness 2016, hep C, HTN, SOB, tobacco use, glaucoma, prostate CA    PT Comments    Pt very apologetic on arrival stating he didn't know he "acted so crazy last time". Pt reassured for progress, cognition, plan and very willing to work toward standing, walking and eventually return home. Pt aware of deficits and very agreeable to further rehab stating he knows he could not reqturn home at this time. Pt with increased strength of RLE today with 3/5 strength continues to need blocking in standing. Improved standing and transfer. Pt moves well with Stedy and recommend its use for nursing.   Follow Up Recommendations  CIR;Supervision/Assistance - 24 hour     Equipment Recommendations  Wheelchair (measurements PT);Hospital bed;3in1 (PT)    Recommendations for Other Services       Precautions / Restrictions Precautions Precautions: Fall Precaution Comments: right hemiplegia, right lean Restrictions Weight Bearing Restrictions: No    Mobility  Bed Mobility               General bed mobility comments: in chair on arrival  Transfers Overall transfer level: Needs assistance   Transfers: Sit to/from Stand Sit to Stand: Min assist;Mod assist         General transfer comment: pt stood from chair with stedy with min assist +2 with cues for safety and sequence, x 2 from stedy flaps with min assist. Mod +2 from chair with right knee blocked and cues for sequence.   Ambulation/Gait Ambulation/Gait assistance: Mod assist;+2 physical assistance Gait Distance (Feet): 2 Feet Assistive device: 2 person hand held assist Gait Pattern/deviations: Step-to pattern;Decreased stance time -  right   Gait velocity interpretation: <1.31 ft/sec, indicative of household ambulator General Gait Details: pt with bil UE support with assist for trunk extension and facilitation for weight shift to step with RLE forward and backward 2' at chair   Stairs             Wheelchair Mobility    Modified Rankin (Stroke Patients Only) Modified Rankin (Stroke Patients Only) Pre-Morbid Rankin Score: Moderate disability Modified Rankin: Severe disability     Balance Overall balance assessment: Needs assistance Sitting-balance support: Bilateral upper extremity supported;Feet supported Sitting balance-Leahy Scale: Poor Sitting balance - Comments: pt able to sit on stedy with bil UE support and guarding for trunk position   Standing balance support: Bilateral upper extremity supported Standing balance-Leahy Scale: Poor                              Cognition Arousal/Alertness: Awake/alert Behavior During Therapy: WFL for tasks assessed/performed Overall Cognitive Status: Within Functional Limits for tasks assessed                   Orientation Level: Disoriented to;Time             General Comments: pt very pleasant, apologetic for behavior during evaluation, aware of deficits and eager to improve. Disoriented to day      Exercises General Exercises - Lower Extremity Long Arc Quad: AROM;10 reps;Seated;Right Hip Flexion/Marching: AROM;10 reps;Seated;Right    General Comments        Pertinent Vitals/Pain Pain Assessment: 0-10 Pain  Score: 3  Pain Location: bil LE soreness in standing Pain Descriptors / Indicators: Sore Pain Intervention(s): Limited activity within patient's tolerance;Repositioned    Home Living                      Prior Function            PT Goals (current goals can now be found in the care plan section) Progress towards PT goals: Progressing toward goals    Frequency           PT Plan Current plan  remains appropriate    Co-evaluation              AM-PAC PT "6 Clicks" Mobility   Outcome Measure  Help needed turning from your back to your side while in a flat bed without using bedrails?: A Lot Help needed moving from lying on your back to sitting on the side of a flat bed without using bedrails?: A Lot Help needed moving to and from a bed to a chair (including a wheelchair)?: A Lot Help needed standing up from a chair using your arms (e.g., wheelchair or bedside chair)?: A Lot Help needed to walk in hospital room?: Total Help needed climbing 3-5 steps with a railing? : Total 6 Click Score: 10    End of Session Equipment Utilized During Treatment: Gait belt Activity Tolerance: Patient tolerated treatment well Patient left: in chair;with call bell/phone within reach;with chair alarm set;with nursing/sitter in room Nurse Communication: Mobility status;Need for lift equipment(recommend Stedy) PT Visit Diagnosis: Other abnormalities of gait and mobility (R26.89);Muscle weakness (generalized) (M62.81);Unsteadiness on feet (R26.81);Hemiplegia and hemiparesis Hemiplegia - Right/Left: Right Hemiplegia - dominant/non-dominant: Dominant Hemiplegia - caused by: Other Nontraumatic intracranial hemorrhage     Time: 0942-1009 PT Time Calculation (min) (ACUTE ONLY): 27 min  Charges:  $Therapeutic Activity: 8-22 mins $Neuromuscular Re-education: 8-22 mins                     [place], PT Acute Rehabilitation Services Pager: [phone] Office: [place] 07/24/2018, 11:14 AM

## 2018-07-24 NOTE — Plan of Care (Signed)
  Problem: Clinical Measurements: Goal: Ability to maintain clinical measurements within normal limits will improve Outcome: Progressing   

## 2018-07-24 NOTE — Progress Notes (Signed)
Inpatient Rehabilitation-Admissions Coordinator   Met with pt at the bedside as follow up from PM&R consult. Pt aware of need for intensive therapies prior to returning home and very interested in CIR. However, pt unsure of how much assistance there will be at home. AC has placed a call to pt's girlfriend, per his permission, to discuss social support and determine most appropriate placement for post acute care. AC plans to meet with pt and his girlfriend around 4:30PM. Will follow up with more information.   Please call if questions.   Jhonnie Garner, OTR/L  Rehab Admissions Coordinator  [phone] 07/24/2018 3:30 PM

## 2018-07-24 NOTE — Progress Notes (Signed)
STROKE TEAM PROGRESS NOTE   SUBJECTIVE (INTERVAL HISTORY) His RN is at the bedside.  he seems to be doing better this morning.He is off the cleviprex drip. No family at the bedside.Patient is requesting medicine to help sleep. OBJECTIVE Temp:  [97.8 F (36.6 C)-99 F (37.2 C)] 98.5 F (36.9 C) (01/06 1600) Pulse Rate:  [25-164] 74 (01/06 1400) Cardiac Rhythm: Normal sinus rhythm (01/06 0817) Resp:  [12-37] 21 (01/06 1600) BP: (99-187)/(64-150) 177/84 (01/06 1600) SpO2:  [73 %-100 %] 91 % (01/06 1400)  No results for input(s): GLUCAP in the last 168 hours. Recent Labs  Lab 07/20/18 1624 07/20/18 1629 07/22/18 0408 07/23/18 0314 07/23/18 2001 07/24/18 0633  NA 144 146* 142 141 140 139  K 3.6 3.6 3.1* 2.8* 3.4* 3.0*  CL 110 111 113* 112* 110 111  CO2 23  --  21* 19* 19* 19*  GLUCOSE 116* 114* 111* 138* 122* 118*  BUN 15 16 12 13 18 16   CREATININE 1.03 1.00 0.86 0.96 1.17 0.97  CALCIUM 9.0  --  8.9 8.9 8.6* 8.6*   Recent Labs  Lab 07/20/18 1624  AST 24  ALT 17  ALKPHOS 94  BILITOT <0.1*  PROT 7.1  ALBUMIN 4.0   Recent Labs  Lab 07/20/18 1624 07/20/18 1629 07/22/18 0408 07/23/18 0314 07/24/18 0633  WBC 6.8  --  9.4 11.3* 7.6  NEUTROABS 4.5  --   --   --   --   HGB 9.0* 10.9* 8.7* 9.4* 8.8*  HCT 32.7* 32.0* 30.2* 32.5* 31.1*  MCV 80.9  --  77.4* 76.5* 77.8*  PLT 421*  --  384 430* 321   No results for input(s): CKTOTAL, CKMB, CKMBINDEX, TROPONINI in the last 168 hours. No results for input(s): LABPROT, INR in the last 72 hours. No results for input(s): COLORURINE, LABSPEC, [place], GLUCOSEU, HGBUR, BILIRUBINUR, KETONESUR, PROTEINUR, UROBILINOGEN, NITRITE, LEUKOCYTESUR in the last 72 hours.  Invalid input(s): APPERANCEUR     Component Value Date/Time   CHOL 102 07/22/2018 0408   TRIG 108 07/22/2018 0408   HDL 35 (L) 07/22/2018 0408   CHOLHDL 2.9 07/22/2018 0408   VLDL 22 07/22/2018 0408   LDLCALC 45 07/22/2018 0408   Lab Results  Component Value Date    HGBA1C 4.7 (L) 07/22/2018      Component Value Date/Time   LABOPIA NONE DETECTED 07/20/2018 1638   COCAINSCRNUR NONE DETECTED 07/20/2018 1638   LABBENZ NONE DETECTED 07/20/2018 1638   AMPHETMU NONE DETECTED 07/20/2018 1638   THCU POSITIVE (A) 07/20/2018 1638   LABBARB NONE DETECTED 07/20/2018 1638    Recent Labs  Lab 07/20/18 1746  ETH <10    I have personally reviewed the radiological images below and agree with the radiology interpretations.  Ct Angio Head W Or Wo Contrast  Result Date: 07/21/2018 CLINICAL DATA:  Followup intracranial hemorrhage. EXAM: CT ANGIOGRAPHY HEAD AND NECK TECHNIQUE: Multidetector CT imaging of the head and neck was performed using the standard protocol during bolus administration of intravenous contrast. Multiplanar CT image reconstructions and MIPs were obtained to evaluate the vascular anatomy. Carotid stenosis measurements (when applicable) are obtained utilizing NASCET criteria, using the distal internal carotid diameter as the denominator. CONTRAST:  168mL ISOVUE-370 IOPAMIDOL (ISOVUE-370) INJECTION 76% COMPARISON:  Head CT same day.  MR angiography 06/03/2015. FINDINGS: CTA NECK FINDINGS Aortic arch: Aortic atherosclerosis. No aneurysm or dissection. Branching pattern of the brachiocephalic vessels is normal without origin stenosis. Right carotid system: Common carotid artery shows some atherosclerotic plaque but  is widely patent to the bifurcation region. There is soft and calcified plaque at the carotid bifurcation and ICA bulb but there is no stenosis. Cervical ICA is widely patent beyond that. Left carotid system: Common carotid artery widely patent to the bifurcation. There is soft and calcified plaque at the carotid bifurcation and ICA bulb. There is no stenosis. Cervical ICA widely patent beyond that. Vertebral arteries: Both vertebral artery origins are widely patent. Both vertebral arteries are widely patent through the cervical region to the foramen  magnum. Skeleton: Ordinary cervical spondylosis. Other neck: No mass or lymphadenopathy. Upper chest: Mild apical scarring.  No significant or active lesion. Review of the MIP images confirms the above findings CTA HEAD FINDINGS Anterior circulation: Both internal carotid arteries are patent through the skull base and siphon regions. There is atherosclerotic calcification in the carotid siphon regions but no stenosis. The anterior and middle cerebral vessels are patent without stenosis, aneurysm or vascular malformation. No vascular lesions seen in the region of the left lateral thalamus/posterior limb internal capsule hemorrhage. Posterior circulation: Both vertebral arteries are patent through the foramen magnum. There is atherosclerotic disease in both V4 segments. 25% stenosis on the right. 50% stenosis on the left. Both vertebral arteries reach the basilar. No basilar stenosis. Posterior circulation branch vessels appear normal. Venous sinuses: Patent and normal. Anatomic variants: None significant. Delayed phase: No abnormal enhancement. Review of the MIP images confirms the above findings IMPRESSION: Atherosclerotic disease at both carotid bifurcations and ICA bulb regions. No stenosis. 25% stenosis of the right V4 segment. 50% stenosis of the left V4 segment. No evidence of abnormal vessel in the region of the left lateral thalamus/posterior limb internal capsule hemorrhage. Electronically Signed   By: Nelson Chimes M.D.   On: 07/21/2018 21:42   Ct Head Wo Contrast  Result Date: 07/21/2018 CLINICAL DATA:  Intracranial hemorrhage follow up EXAM: CT HEAD WITHOUT CONTRAST TECHNIQUE: Contiguous axial images were obtained from the base of the skull through the vertex without intravenous contrast. COMPARISON:  07/20/2018 FINDINGS: Brain: Unchanged hemorrhage within the left thalamus. Mild surrounding edema. The size and configuration of the ventricles and extra-axial CSF spaces are normal. There is  hypoattenuation of the periventricular white matter, most commonly indicating chronic ischemic microangiopathy. Vascular: No abnormal hyperdensity of the major intracranial arteries or dural venous sinuses. No intracranial atherosclerosis. Skull: The visualized skull base, calvarium and extracranial soft tissues are normal. Sinuses/Orbits: No fluid levels or advanced mucosal thickening of the visualized paranasal sinuses. No mastoid or middle ear effusion. The orbits are normal. IMPRESSION: Unchanged hemorrhage within the left thalamus with mild surrounding edema. Electronically Signed   By: Ulyses Jarred M.D.   On: 07/21/2018 00:45   Ct Angio Neck W Or Wo Contrast  Result Date: 07/21/2018 CLINICAL DATA:  Followup intracranial hemorrhage. EXAM: CT ANGIOGRAPHY HEAD AND NECK TECHNIQUE: Multidetector CT imaging of the head and neck was performed using the standard protocol during bolus administration of intravenous contrast. Multiplanar CT image reconstructions and MIPs were obtained to evaluate the vascular anatomy. Carotid stenosis measurements (when applicable) are obtained utilizing NASCET criteria, using the distal internal carotid diameter as the denominator. CONTRAST:  119mL ISOVUE-370 IOPAMIDOL (ISOVUE-370) INJECTION 76% COMPARISON:  Head CT same day.  MR angiography 06/03/2015. FINDINGS: CTA NECK FINDINGS Aortic arch: Aortic atherosclerosis. No aneurysm or dissection. Branching pattern of the brachiocephalic vessels is normal without origin stenosis. Right carotid system: Common carotid artery shows some atherosclerotic plaque but is widely patent to the bifurcation region.  There is soft and calcified plaque at the carotid bifurcation and ICA bulb but there is no stenosis. Cervical ICA is widely patent beyond that. Left carotid system: Common carotid artery widely patent to the bifurcation. There is soft and calcified plaque at the carotid bifurcation and ICA bulb. There is no stenosis. Cervical ICA widely  patent beyond that. Vertebral arteries: Both vertebral artery origins are widely patent. Both vertebral arteries are widely patent through the cervical region to the foramen magnum. Skeleton: Ordinary cervical spondylosis. Other neck: No mass or lymphadenopathy. Upper chest: Mild apical scarring.  No significant or active lesion. Review of the MIP images confirms the above findings CTA HEAD FINDINGS Anterior circulation: Both internal carotid arteries are patent through the skull base and siphon regions. There is atherosclerotic calcification in the carotid siphon regions but no stenosis. The anterior and middle cerebral vessels are patent without stenosis, aneurysm or vascular malformation. No vascular lesions seen in the region of the left lateral thalamus/posterior limb internal capsule hemorrhage. Posterior circulation: Both vertebral arteries are patent through the foramen magnum. There is atherosclerotic disease in both V4 segments. 25% stenosis on the right. 50% stenosis on the left. Both vertebral arteries reach the basilar. No basilar stenosis. Posterior circulation branch vessels appear normal. Venous sinuses: Patent and normal. Anatomic variants: None significant. Delayed phase: No abnormal enhancement. Review of the MIP images confirms the above findings IMPRESSION: Atherosclerotic disease at both carotid bifurcations and ICA bulb regions. No stenosis. 25% stenosis of the right V4 segment. 50% stenosis of the left V4 segment. No evidence of abnormal vessel in the region of the left lateral thalamus/posterior limb internal capsule hemorrhage. Electronically Signed   By: Nelson Chimes M.D.   On: 07/21/2018 21:42   Ct Head Code Stroke Wo Contrast  Result Date: 07/20/2018 CLINICAL DATA:  Code stroke. Combative with confusion. Hypertensive patient with RIGHT arm weakness. EXAM: CT HEAD WITHOUT CONTRAST TECHNIQUE: Contiguous axial images were obtained from the base of the skull through the vertex without  intravenous contrast. COMPARISON:  01/10/2018. FINDINGS: Motion degraded scan.  Overall study diagnostic. Brain: There is a 16 x 15 x 22 mm acute hemorrhage, approximate volume 3 mL, involving the LEFT lateral superior thalamus extending into the periventricular/posterior lentiform nucleus region. Mild surrounding edema. No midline shift. No intraventricular or subarachnoid extension. Premature for age atrophy. Chronic microvascular ischemic change of the periventricular and subcortical white matter. Vascular: Calcification of the cavernous internal carotid arteries consistent with cerebrovascular atherosclerotic disease. No signs of intracranial large vessel occlusion. Skull: Calvarium intact. Sinuses/Orbits: No significant sinus disease. Negative orbits. Other: None. Compared with 01/10/2018, the degree of atrophy is similar. The bleed is acute. ASPECTS [facility] Stroke Program Early CT Score) Not applicable due to bleed. IMPRESSION: 1. 16 x 15 x 22 mm acute hemorrhage involving the LEFT lateral superior thalamus extending into the periventricular/posterior lentiform nucleus region. Mild surrounding edema. No midline shift. Hypertensive etiology is likely. 2. ASPECTS is not applicable due to bleed. 3. Critical Value/emergent results were called by telephone at the time of interpretation on 07/20/2018 at 4:28 pm to Dr. Amie Portland , who verbally acknowledged these results. * Electronically Signed   By: Staci Righter M.D.   On: 07/20/2018 16:33    PHYSICAL EXAM  Temp:  [97.8 F (36.6 C)-99 F (37.2 C)] 98.5 F (36.9 C) (01/06 1600) Pulse Rate:  [25-164] 74 (01/06 1400) Resp:  [12-37] 21 (01/06 1600) BP: (99-187)/(64-150) 177/84 (01/06 1600) SpO2:  [73 %-100 %] 91 % (  01/06 1400)  General - Well nourished, well developed,  Elderly african [place] male not in distress but in restraints due to agitation Ophthalmologic - fundi not visualized due to noncooperation.  Cardiovascular - Regular rate and  rhythm.  Mental Status -  Level of arousal and orientation to time, place, and person were intact. Language including expression, naming, repetition, comprehension was assessed and found intact. However, moderate to severe dysarthria  Cranial Nerves II - XII - II - Visual field intact OU. III, IV, VI - Extraocular movements intact. V - Facial sensation intact bilaterally. VII - right facial droop. VIII - Hearing & vestibular intact bilaterally. X - Palate elevates symmetrically, moderate to severe dysarthria. XI - Chin turning & shoulder shrug intact bilaterally. XII - Tongue protrusion intact.  Motor Strength - The patient's strength showed LUE 3/5 and LLE 4/5. RUE 2+/5 and LLE 3-/5.  Bulk was normal and fasciculations were absent.   Motor Tone - Muscle tone was assessed at the neck and appendages and was normal.  Reflexes - The patient's reflexes were symmetrical in all extremities and he had no pathological reflexes.  Sensory - Light touch, temperature/pinprick were assessed and were symmetrical.    Coordination - not cooperative on exam.  Tremor was absent.  Gait and Station - deferred.   ASSESSMENT/PLAN [name] is a 68 y.o. male with history of right pontine infarct in 05/2015, smoker, hypertension, hyperlipidemia, hep C admitted for worsening slurred speech and right-sided facial droop. No tPA given due to [place].    ICH:  left BG/CR ICH, secondary to uncontrolled hypertension and smoking  Resultant right-sided weakness more than left sided weakness  CT head left BG/CR ICH  Repeat CT head showed stable BG/CR ICH on the left CT head and neck Atherosclerotic disease at both carotid bifurcations and ICA bulb regions. No stenosis.25% stenosis of the right V4 segment. 50% stenosis of the left V4 segment.No evidence of abnormal vessel in the region of the left lateral thalamus/posterior limb internal capsule hemorrhage.  2D Echo EF 65 to 70%  LDL 45 less than  goal 70  HgbA1c 4.7  UDS positive for THC  SCDs for VTE prophylaxis  aspirin 325 mg daily prior to admission, now on No antithrombotic due to [place]  Patient counseled to be compliant with his antithrombotic medications  Ongoing aggressive stroke risk factor management  Therapy recommendations: CIR  Disposition: Pending.   History of stroke  05/2015, admitted for left-sided weakness, MRI showed right pontine infarct.  TCD diffuse atherosclerosis.  Carotid Doppler negative.  EF 60 to 65%.  LDL 40 and A1c 5.8.  He was discharged with aspirin 325.  Has residual mild left-sided weakness.  Hypertension . Stable with high dose of cleviprex but needs to be weaned off  .  able to tolerate po had a bedside swallow eval so oral antihypertensive medications started  But staggered to avoid dropping pressures. Home medications: Norvasc 10 mg now, then restart lisinopril, 40 mg po at 1600. HCTZ held off on restarting. May possibly resume tomorrow.   . BP goal < 180   Long term BP goal normotensive  Tobacco abuse  Current smoker  Smoking cessation counseling provided  Pt is willing to quit- may benefit  from patches or assistive aids   ?? Alcohol withdraw  Intermittent agitation, insisting going home  Denies heavy alcohol use, but stated that seldomly drink beer for celebration  On Precedex  Consider vitamin B1/multivitamin/folic acid once p.o. access  Other Stroke Risk Factors  Advanced age  Obesity, Body mass index is 29.98 kg/m.   THC use  Other Active Problems  Hepatitis C  Hospital day # 4 Plan: Continue mobilization and therapy consults. Use prn labetalol and hydralazine    Use Seroquel and restraints for agitation. Transfer to floor bed later.if off cleviprex drip. Add trazodone for sleep at night. No family at bedside This patient is critically ill due to left BG ICH, history of stroke, uncontrolled hypertension, THC use, questionable alcohol withdrawal and at  significant risk of neurological worsening, death form hematoma expansion, cerebral edema, brain herniation, heart failure, DT. This patient's care requires constant monitoring of vital signs, hemodynamics, respiratory and cardiac monitoring, review of multiple databases, neurological assessment, discussion with family, other specialists and medical decision making of high complexity. I spent 35 minutes of neurocritical care time in the care of this patient.   Jonathon Contras, MD Medical Director [facility] Stroke Center Pager: [phone] 07/24/2018 4:57 PM 07/24/2018 4:57 PM    To contact Stroke Continuity provider, please refer to [email or web address] After hours, contact General Neurology

## 2018-07-25 LAB — BASIC METABOLIC PANEL
Anion gap: 9 (ref 5–15)
BUN: 16 mg/dL (ref 8–23)
CO2: 21 mmol/L — ABNORMAL LOW (ref 22–32)
Calcium: 8.5 mg/dL — ABNORMAL LOW (ref 8.9–10.3)
Chloride: 111 mmol/L (ref 98–111)
Creatinine, Ser: 1.04 mg/dL (ref 0.61–1.24)
GFR calc Af Amer: >60 mL/min (ref >60)
GFR calc non Af Amer: >60 mL/min (ref >60)
Glucose, Bld: 108 mg/dL — ABNORMAL HIGH (ref 70–99)
Potassium: 3.3 mmol/L — ABNORMAL LOW (ref 3.5–5.1)
Sodium: 141 mmol/L (ref 135–145)

## 2018-07-25 LAB — CBC
HCT: 29.2 % — ABNORMAL LOW (ref 39.0–52.0)
Hemoglobin: 8.6 g/dL — ABNORMAL LOW (ref 13.0–17.0)
MCH: 22.9 pg — ABNORMAL LOW (ref 26.0–34.0)
MCHC: 29.5 g/dL — ABNORMAL LOW (ref 30.0–36.0)
MCV: 77.7 fL — ABNORMAL LOW (ref 80.0–100.0)
Platelets: 332 10*3/uL (ref 150–400)
RBC: 3.76 MIL/uL — ABNORMAL LOW (ref 4.22–5.81)
RDW: 18.3 % — ABNORMAL HIGH (ref 11.5–15.5)
WBC: 5.7 10*3/uL (ref 4.0–10.5)
nRBC: 0 % (ref 0.0–0.2)

## 2018-07-25 MED ORDER — POTASSIUM CHLORIDE CRYS ER 20 MEQ PO TBCR
40.0000 meq | EXTENDED_RELEASE_TABLET | Freq: Two times a day (BID) | ORAL | Status: AC
  Administered 2018-07-25 (×2): 40 meq via ORAL
  Filled 2018-07-25 (×2): qty 2

## 2018-07-25 MED ORDER — TRAZODONE HCL 100 MG PO TABS
100.0000 mg | ORAL_TABLET | Freq: Every day | ORAL | Status: DC
  Administered 2018-07-25: 100 mg via ORAL
  Filled 2018-07-25: qty 1

## 2018-07-25 NOTE — Care Management Important Message (Signed)
Important Message  Patient Details  Name: [name] MRN: [id number] Date of Birth: 1950/08/02   Medicare Important Message Given:  Yes    [name] 07/25/2018, 3:30 PM

## 2018-07-25 NOTE — Progress Notes (Signed)
Physical Therapy Treatment Patient Details Name: [name] MRN: [id number] DOB: 1951/03/23 Today's Date: 07/25/2018    History of Present Illness 68 yo admitted with slurred speech and Rt facial droop with Left lateral thalamic ICH with HTN, agitation acutely. PMHx; Rt pontine infarct with left weakness 2016, hep C, HTN, SOB, tobacco use, glaucoma, prostate CA    PT Comments    Pt performed gait training and functional mobility with moderate assistance.  Pt slow and guarded with multiple LOB.  Continues to benefit from CIR therapies as he is not able to have his needs met at home.  Pt is agreeable to this plan.      Follow Up Recommendations  CIR;Supervision/Assistance - 24 hour     Equipment Recommendations  Wheelchair (measurements PT);Hospital bed;3in1 (PT)    Recommendations for Other Services Rehab consult;Speech consult     Precautions / Restrictions Precautions Precautions: Fall Precaution Comments: right hemiplegia, right lean Restrictions Weight Bearing Restrictions: No    Mobility  Bed Mobility Overal bed mobility: Needs Assistance Bed Mobility: Supine to Sit     Supine to sit: Min assist;HOB elevated     General bed mobility comments: pt exiting on R side of bed with L Side initiation. pt needs (A) to rotate to EOB and elevate trunk.  Presents with posterior lean and required cues for weight shifting forward.    Transfers Overall transfer level: Needs assistance Equipment used: Rolling walker (2 wheeled) Transfers: Sit to/from Stand Sit to Stand: Mod assist;+2 physical assistance         General transfer comment: Pt required cues for hand placement to push into standing.    Ambulation/Gait Ambulation/Gait assistance: Mod assist;+2 physical assistance Gait Distance (Feet): 25 Feet Assistive device: Rolling walker (2 wheeled) Gait Pattern/deviations: Step-to pattern;Decreased stance time - right     General Gait Details: Cues for weight  shifting and step length.  Pt required assistance to weight shift on R,     Stairs             Wheelchair Mobility    Modified Rankin (Stroke Patients Only)       Balance Overall balance assessment: Needs assistance Sitting-balance support: Bilateral upper extremity supported;Feet supported Sitting balance-Leahy Scale: Poor       Standing balance-Leahy Scale: Poor                              Cognition Arousal/Alertness: Awake/alert Behavior During Therapy: WFL for tasks assessed/performed Overall Cognitive Status: Impaired/Different from baseline Area of Impairment: Awareness;Memory                 Orientation Level: Disoriented to;Time Current Attention Level: Selective Memory: Decreased recall of precautions;Decreased short-term memory Following Commands: Follows one step commands with increased time Safety/Judgement: Decreased awareness of safety;Decreased awareness of deficits Awareness: Intellectual Problem Solving: Slow processing;Decreased initiation;Difficulty sequencing;Requires verbal cues;Requires tactile cues General Comments: pt states "i want to walk in the hall now" pt demonstrates decr awareness to R LE deficits.       Exercises General Exercises - Lower Extremity Long Arc Quad: AROM;10 reps;Seated;Both Hip Flexion/Marching: AROM;10 reps;Seated;Right    General Comments        Pertinent Vitals/Pain Pain Assessment: No/denies pain Pain Score: 3  Pain Location: bil LE soreness in standing Pain Descriptors / Indicators: Sore Pain Intervention(s): Monitored during session;Repositioned    Home Living  Prior Function            PT Goals (current goals can now be found in the care plan section) Acute Rehab PT Goals Patient Stated Goal: to walk again Potential to Achieve Goals: Fair Progress towards PT goals: Progressing toward goals    Frequency    Min 3X/week      PT Plan  Current plan remains appropriate    Co-evaluation              AM-PAC PT "6 Clicks" Mobility   Outcome Measure  Help needed turning from your back to your side while in a flat bed without using bedrails?: A Lot Help needed moving from lying on your back to sitting on the side of a flat bed without using bedrails?: A Lot Help needed moving to and from a bed to a chair (including a wheelchair)?: A Lot Help needed standing up from a chair using your arms (e.g., wheelchair or bedside chair)?: A Lot Help needed to walk in hospital room?: Total Help needed climbing 3-5 steps with a railing? : Total 6 Click Score: 10    End of Session Equipment Utilized During Treatment: Gait belt Activity Tolerance: Patient tolerated treatment well Patient left: in chair;with call bell/phone within reach;with chair alarm set;with nursing/sitter in room Nurse Communication: Mobility status;Need for lift equipment PT Visit Diagnosis: Other abnormalities of gait and mobility (R26.89);Muscle weakness (generalized) (M62.81);Unsteadiness on feet (R26.81);Hemiplegia and hemiparesis Hemiplegia - Right/Left: Right Hemiplegia - dominant/non-dominant: Dominant Hemiplegia - caused by: Other Nontraumatic intracranial hemorrhage     Time: 1355-1414 PT Time Calculation (min) (ACUTE ONLY): 19 min  Charges:  $Gait Training: 8-22 mins                     Governor Rooks, PTA Acute Rehabilitation Services Pager [phone] Office [phone]     Cristela Blue 07/25/2018, 5:08 PM

## 2018-07-25 NOTE — Progress Notes (Addendum)
STROKE TEAM PROGRESS NOTE   SUBJECTIVE (INTERVAL HISTORY) Complains that [name] did not sleep last night. Wants more trazadone. Already on 50 + seroquel. Will increase. Await CIR bed.  OBJECTIVE Vitals:   07/24/18 2325 07/25/18 0309 07/25/18 0310 07/25/18 0851  BP: 121/60  131/73 (!) 163/85  Pulse: 89 89 87 82  Resp: 20 20 20 19   Temp: 98.7 F (37.1 C) 98.7 F (37.1 C) 98.4 F (36.9 C) 98.2 F (36.8 C)  TempSrc: Oral Oral Oral Oral  SpO2: 100% 100% 98% 99%  Weight:      Height:          Recent Labs  Lab 07/22/18 0408 07/23/18 0314 07/23/18 2001 07/24/18 0633 07/25/18 0614  NA 142 141 140 139 141  K 3.1* 2.8* 3.4* 3.0* 3.3*  CL 113* 112* 110 111 111  CO2 21* 19* 19* 19* 21*  GLUCOSE 111* 138* 122* 118* 108*  BUN 12 13 18 16 16   CREATININE 0.86 0.96 1.17 0.97 1.04  CALCIUM 8.9 8.9 8.6* 8.6* 8.5*   Recent Labs  Lab 07/20/18 1624  AST 24  ALT 17  ALKPHOS 94  BILITOT <0.1*  PROT 7.1  ALBUMIN 4.0   Recent Labs  Lab 07/20/18 1624 07/20/18 1629 07/22/18 0408 07/23/18 0314 07/24/18 0633 07/25/18 0614  WBC 6.8  --  9.4 11.3* 7.6 5.7  NEUTROABS 4.5  --   --   --   --   --   HGB 9.0* 10.9* 8.7* 9.4* 8.8* 8.6*  HCT 32.7* 32.0* 30.2* 32.5* 31.1* 29.2*  MCV 80.9  --  77.4* 76.5* 77.8* 77.7*  PLT 421*  --  384 430* 321 332       Component Value Date/Time   CHOL 102 07/22/2018 0408   TRIG 108 07/22/2018 0408   HDL 35 (L) 07/22/2018 0408   CHOLHDL 2.9 07/22/2018 0408   VLDL 22 07/22/2018 0408   LDLCALC 45 07/22/2018 0408   Lab Results  Component Value Date   HGBA1C 4.7 (L) 07/22/2018      Component Value Date/Time   LABOPIA NONE DETECTED 07/20/2018 1638   COCAINSCRNUR NONE DETECTED 07/20/2018 1638   LABBENZ NONE DETECTED 07/20/2018 1638   AMPHETMU NONE DETECTED 07/20/2018 1638   THCU POSITIVE (A) 07/20/2018 1638   LABBARB NONE DETECTED 07/20/2018 1638    Recent Labs  Lab 07/20/18 1746  ETH <10    Ct Angio Head W Or Wo Contrast  Result Date:  07/21/2018 CLINICAL DATA:  Followup intracranial hemorrhage. EXAM: CT ANGIOGRAPHY HEAD AND NECK TECHNIQUE: Multidetector CT imaging of Jonathon head and neck was performed using Jonathon standard protocol during bolus administration of intravenous contrast. Multiplanar CT image reconstructions and MIPs were obtained to evaluate Jonathon vascular anatomy. Carotid stenosis measurements (when applicable) are obtained utilizing NASCET criteria, using Jonathon distal internal carotid diameter as Jonathon denominator. CONTRAST:  163mL ISOVUE-370 IOPAMIDOL (ISOVUE-370) INJECTION 76% COMPARISON:  Head CT same day.  MR angiography 06/03/2015. FINDINGS: CTA NECK FINDINGS Aortic arch: Aortic atherosclerosis. No aneurysm or dissection. Branching pattern of Jonathon brachiocephalic vessels is normal without origin stenosis. Right carotid system: Common carotid artery shows some atherosclerotic plaque but is widely patent to Jonathon bifurcation region. There is soft and calcified plaque at Jonathon carotid bifurcation and ICA bulb but there is no stenosis. Cervical ICA is widely patent beyond that. Left carotid system: Common carotid artery widely patent to Jonathon bifurcation. There is soft and calcified plaque at Jonathon carotid bifurcation and ICA bulb. There is no  stenosis. Cervical ICA widely patent beyond that. Vertebral arteries: Both vertebral artery origins are widely patent. Both vertebral arteries are widely patent through Jonathon cervical region to Jonathon foramen magnum. Skeleton: Ordinary cervical spondylosis. Other neck: No mass or lymphadenopathy. Upper chest: Mild apical scarring.  No significant or active lesion. Review of Jonathon MIP images confirms Jonathon above findings CTA HEAD FINDINGS Anterior circulation: Both internal carotid arteries are patent through Jonathon skull base and siphon regions. There is atherosclerotic calcification in Jonathon carotid siphon regions but no stenosis. Jonathon anterior and middle cerebral vessels are patent without stenosis, aneurysm or vascular  malformation. No vascular lesions seen in Jonathon region of Jonathon left lateral thalamus/posterior limb internal capsule hemorrhage. Posterior circulation: Both vertebral arteries are patent through Jonathon foramen magnum. There is atherosclerotic disease in both V4 segments. 25% stenosis on Jonathon right. 50% stenosis on Jonathon left. Both vertebral arteries reach Jonathon basilar. No basilar stenosis. Posterior circulation branch vessels appear normal. Venous sinuses: Patent and normal. Anatomic variants: None significant. Delayed phase: No abnormal enhancement. Review of Jonathon MIP images confirms Jonathon above findings IMPRESSION: Atherosclerotic disease at both carotid bifurcations and ICA bulb regions. No stenosis. 25% stenosis of Jonathon right V4 segment. 50% stenosis of Jonathon left V4 segment. No evidence of abnormal vessel in Jonathon region of Jonathon left lateral thalamus/posterior limb internal capsule hemorrhage. Electronically Signed   By: Nelson Chimes M.D.   On: 07/21/2018 21:42   Ct Head Wo Contrast  Result Date: 07/21/2018 CLINICAL DATA:  Intracranial hemorrhage follow up EXAM: CT HEAD WITHOUT CONTRAST TECHNIQUE: Contiguous axial images were obtained from Jonathon base of Jonathon skull through Jonathon vertex without intravenous contrast. COMPARISON:  07/20/2018 FINDINGS: Brain: Unchanged hemorrhage within Jonathon left thalamus. Mild surrounding edema. Jonathon size and configuration of Jonathon ventricles and extra-axial CSF spaces are normal. There is hypoattenuation of Jonathon periventricular white matter, most commonly indicating chronic ischemic microangiopathy. Vascular: No abnormal hyperdensity of Jonathon major intracranial arteries or dural venous sinuses. No intracranial atherosclerosis. Skull: Jonathon visualized skull base, calvarium and extracranial soft tissues are normal. Sinuses/Orbits: No fluid levels or advanced mucosal thickening of Jonathon visualized paranasal sinuses. No mastoid or middle ear effusion. Jonathon orbits are normal. IMPRESSION: Unchanged hemorrhage within  Jonathon left thalamus with mild surrounding edema. Electronically Signed   By: Ulyses Jarred M.D.   On: 07/21/2018 00:45   Ct Angio Neck W Or Wo Contrast  Result Date: 07/21/2018 CLINICAL DATA:  Followup intracranial hemorrhage. EXAM: CT ANGIOGRAPHY HEAD AND NECK TECHNIQUE: Multidetector CT imaging of Jonathon head and neck was performed using Jonathon standard protocol during bolus administration of intravenous contrast. Multiplanar CT image reconstructions and MIPs were obtained to evaluate Jonathon vascular anatomy. Carotid stenosis measurements (when applicable) are obtained utilizing NASCET criteria, using Jonathon distal internal carotid diameter as Jonathon denominator. CONTRAST:  133mL ISOVUE-370 IOPAMIDOL (ISOVUE-370) INJECTION 76% COMPARISON:  Head CT same day.  MR angiography 06/03/2015. FINDINGS: CTA NECK FINDINGS Aortic arch: Aortic atherosclerosis. No aneurysm or dissection. Branching pattern of Jonathon brachiocephalic vessels is normal without origin stenosis. Right carotid system: Common carotid artery shows some atherosclerotic plaque but is widely patent to Jonathon bifurcation region. There is soft and calcified plaque at Jonathon carotid bifurcation and ICA bulb but there is no stenosis. Cervical ICA is widely patent beyond that. Left carotid system: Common carotid artery widely patent to Jonathon bifurcation. There is soft and calcified plaque at Jonathon carotid bifurcation and ICA bulb. There is no stenosis. Cervical ICA widely patent beyond that.  Vertebral arteries: Both vertebral artery origins are widely patent. Both vertebral arteries are widely patent through Jonathon cervical region to Jonathon foramen magnum. Skeleton: Ordinary cervical spondylosis. Other neck: No mass or lymphadenopathy. Upper chest: Mild apical scarring.  No significant or active lesion. Review of Jonathon MIP images confirms Jonathon above findings CTA HEAD FINDINGS Anterior circulation: Both internal carotid arteries are patent through Jonathon skull base and siphon regions. There is  atherosclerotic calcification in Jonathon carotid siphon regions but no stenosis. Jonathon anterior and middle cerebral vessels are patent without stenosis, aneurysm or vascular malformation. No vascular lesions seen in Jonathon region of Jonathon left lateral thalamus/posterior limb internal capsule hemorrhage. Posterior circulation: Both vertebral arteries are patent through Jonathon foramen magnum. There is atherosclerotic disease in both V4 segments. 25% stenosis on Jonathon right. 50% stenosis on Jonathon left. Both vertebral arteries reach Jonathon basilar. No basilar stenosis. Posterior circulation branch vessels appear normal. Venous sinuses: Patent and normal. Anatomic variants: None significant. Delayed phase: No abnormal enhancement. Review of Jonathon MIP images confirms Jonathon above findings IMPRESSION: Atherosclerotic disease at both carotid bifurcations and ICA bulb regions. No stenosis. 25% stenosis of Jonathon right V4 segment. 50% stenosis of Jonathon left V4 segment. No evidence of abnormal vessel in Jonathon region of Jonathon left lateral thalamus/posterior limb internal capsule hemorrhage. Electronically Signed   By: Nelson Chimes M.D.   On: 07/21/2018 21:42   Ct Head Code Stroke Wo Contrast  Result Date: 07/20/2018 CLINICAL DATA:  Code stroke. Combative with confusion. Hypertensive patient with RIGHT arm weakness. EXAM: CT HEAD WITHOUT CONTRAST TECHNIQUE: Contiguous axial images were obtained from Jonathon base of Jonathon skull through Jonathon vertex without intravenous contrast. COMPARISON:  01/10/2018. FINDINGS: Motion degraded scan.  Overall study diagnostic. Brain: There is a 16 x 15 x 22 mm acute hemorrhage, approximate volume 3 mL, involving Jonathon LEFT lateral superior thalamus extending into Jonathon periventricular/posterior lentiform nucleus region. Mild surrounding edema. No midline shift. No intraventricular or subarachnoid extension. Premature for age atrophy. Chronic microvascular ischemic change of Jonathon periventricular and subcortical white matter. Vascular:  Calcification of Jonathon cavernous internal carotid arteries consistent with cerebrovascular atherosclerotic disease. No signs of intracranial large vessel occlusion. Skull: Calvarium intact. Sinuses/Orbits: No significant sinus disease. Negative orbits. Other: None. Compared with 01/10/2018, Jonathon degree of atrophy is similar. Jonathon bleed is acute. ASPECTS [facility] Stroke Program Early CT Score) Not applicable due to bleed. IMPRESSION: 1. 16 x 15 x 22 mm acute hemorrhage involving Jonathon LEFT lateral superior thalamus extending into Jonathon periventricular/posterior lentiform nucleus region. Mild surrounding edema. No midline shift. Hypertensive etiology is likely. 2. ASPECTS is not applicable due to bleed. 3. Critical Value/emergent results were called by telephone at Jonathon time of interpretation on 07/20/2018 at 4:28 pm to Dr. Amie [place] , who verbally acknowledged these results. * Electronically Signed   By: Staci Righter M.D.   On: 07/20/2018 16:33    PHYSICAL EXAM per Dr. Madison Hickman - Well nourished, well developed,  [name] not in distress  Cardiovascular - Regular rate and rhythm.  Mental Status -  Level of arousal and orientation to time, place, and person were intact. Language including expression, naming, repetition, comprehension was assessed and found intact. However, moderate to severe dysarthria  Cranial Nerves II - XII - II - Visual field intact OU. III, IV, VI - Extraocular movements intact. V - Facial sensation intact bilaterally. VII - right facial droop. VIII - Hearing & vestibular intact bilaterally. X - Palate elevates  symmetrically, moderate to severe dysarthria. XI - Chin turning & shoulder shrug intact bilaterally. XII - Tongue protrusion intact.  Motor Strength - [name] strength showed LUE 3/5 and LLE 4/5. RUE 2+/5 and LLE 3-/5.  Bulk was normal and fasciculations were absent.   Motor Tone - Muscle tone was assessed at Jonathon neck and appendages and was  normal.  Reflexes - [name] reflexes were symmetrical in all extremities and [name] had no pathological reflexes.  Sensory - Light touch, temperature/pinprick were assessed and were symmetrical.    Coordination - not cooperative on exam.  Tremor was absent.  Gait and Station - deferred.   ASSESSMENT/PLAN [name] is a 68 y.o. [name] with history of right pontine infarct in 05/2015, smoker, hypertension, hyperlipidemia, hep C admitted for worsening slurred speech and right-sided facial droop. No tPA given due to [place].    ICH:  left BG/CR ICH, secondary to uncontrolled hypertension and smoking  Resultant right-sided weakness more than left sided weakness  CT head left BG/CR ICH  Repeat CT head showed stable BG/CR ICH on Jonathon left   CT head and neck Atherosclerotic disease at both carotid bifurcations and ICA bulb regions. No stenosis.25% stenosis of Jonathon right V4 segment. 50% stenosis of Jonathon left V4 segment. No evidence of abnormal vessel in Jonathon region of Jonathon left lateral thalamus/posterior limb internal capsule hemorrhage.  2D Echo EF 65 to 70%  LDL 45 less than goal 70  HgbA1c 4.7  UDS positive for THC  SCDs for VTE prophylaxis  aspirin 325 mg daily prior to admission, now on No antithrombotic due to [place]  Therapy recommendations: CIR - coordinator meeting with girlfriend to assess home situation in order to determine appropriateness of CIR  Disposition: Pending.   History of stroke  05/2015, admitted for left-sided weakness, MRI showed right pontine infarct.  TCD diffuse atherosclerosis.  Carotid Doppler negative.  EF 60 to 65%.  LDL 40 and A1c 5.8.  [name] was discharged with aspirin 325.  Has residual mild left-sided weakness.  Hypertension . Treated with cleviprex on ICU . Home medications: Norvasc 10 mg, lisinopril 40 mg. catapress 0.3 bid, HCTZ 12.5 bid - have not picked up recent refills. Doubt [name] was taking . No on:  norvasc 10, lisinopril 40 . BP goal <  180  . Overall, improving - 120-160 past 5 readings  Long term BP goal normotensive  Tobacco abuse  Current smoker  Smoking cessation counseling provided  Pt is willing to quit- may benefit  from patches or assistive aids   Agitation Insomnia  ?? Alcohol withdraw  Intermittent agitation, insisting going home  Denies heavy alcohol use, but stated that seldomly drink beer for celebration  Treated w/ Precedex on ICU, now off  On seroquel   On traza q hs - will increase to 1000 q hs (takes [place] w/ trazadone at home)  Other Stroke Risk Factors  Advanced age  Obesity, Body mass index is 29.04 kg/m.   THC use  Other Active Problems  Hepatitis C  Hypokalemia K 3.3 today, replace. Recheck in am   Leukocytosis, resolved WBC 11.3 -> 5.7  Anemia Hgb 8.6, was 10.9 on admission  Hospital day # [place], MSN, APRN, ANVP-BC, AGPCNP-BC Advanced Practice Stroke Nurse [place] for Schedule & Pager information 07/25/2018 3:34 PM   I have personally obtained history,examined this patient, reviewed notes, independently viewed imaging studies, participated in medical decision making and plan  of care.ROS completed by me personally and pertinent positives fully documented  I have made any additions or clarifications directly to Jonathon above note. Agree with note above.    Antony Contras, MD Medical Director [place] Pager: [phone] 07/25/2018 4:53 PM   To contact Stroke Continuity provider, please refer to [email or web address] After hours, contact General Neurology

## 2018-07-25 NOTE — Progress Notes (Signed)
Pharmacy is unable to confirm the medications the patient was taking at home. All options have been exhausted and a resolution to the situation is not expected.   Where possible, their outpatient pharmacy(s) have been contacted for the last time prescriptions were filled and that information has been added to each medication in an Order Note (highlighted yellow below the medication). NOTE: clonidine and HCTZ doses increased in December but he never picked them up.  Please contact pharmacy if further assistance is needed.   Romeo Rabon, PharmD. Mobile: [phone]. 07/25/2018,9:35 AM.

## 2018-07-25 NOTE — Progress Notes (Signed)
Occupational Therapy Treatment Patient Details Name: [name] MRN: [id number] DOB: [birth year] Today's Date: 07/25/2018    History of present illness 68 yo admitted with slurred speech and Rt facial droop with Left lateral thalamic ICH with HTN, agitation acutely. PMHx; Rt pontine infarct with left weakness 2016, hep C, HTN, SOB, tobacco use, glaucoma, prostate CA   OT comments  Pt demonstrates ability to engage in adl task at sink level in stedy with visual cues for balance using the mirror. Pt requires max (A) for LB at this time. Pt with R lateral lean and R weakness that affects all adls at this time. Pt very motivated and engaged in task. Pt states "this was better than walking. My grandchildren are coming to see me today."     Follow Up Recommendations  CIR    Equipment Recommendations  Other (comment)(defer to next venue)    Recommendations for Other Services Rehab consult    Precautions / Restrictions Precautions Precautions: Fall Precaution Comments: right hemiplegia, right lean       Mobility Bed Mobility Overal bed mobility: Needs Assistance Bed Mobility: Supine to Sit     Supine to sit: Min assist;HOB elevated     General bed mobility comments: pt exiting on R side of bed with L Side initiation. pt needs (A) to rotate to EOB and elevate trunk  Transfers Overall transfer level: Needs assistance   Transfers: Sit to/from Stand Sit to Stand: Min assist;From elevated surface         General transfer comment: pt using bil UE on bar to pull up into standign with (A) for R lean and grasp on bar    Balance Overall balance assessment: Needs assistance Sitting-balance support: Bilateral upper extremity supported;Feet supported Sitting balance-Leahy Scale: Poor Sitting balance - Comments: right lateral lean                                   ADL either performed or assessed with clinical judgement   ADL Overall ADL's : Needs  assistance/impaired     Grooming: Wash/dry face;Applying deodorant;Sitting;Minimal assistance(Stedy ) Grooming Details (indicate cue type and reason): pt able to use mirror for self correction and posture. pt dropping wash cloth in R hand iniitally and using more of a wrapping around hand method to maintain grasp. pt with bil UE use to clean face with MOD (A) from therapist due to lateral lean R Upper Body Bathing: Moderate assistance;Sitting Upper Body Bathing Details (indicate cue type and reason): sink level stedy Lower Body Bathing: Maximal assistance;Sit to/from stand Lower Body Bathing Details (indicate cue type and reason): pt able to wash anterior peri area Upper Body Dressing : Moderate assistance;Sitting Upper Body Dressing Details (indicate cue type and reason): stedy (A) to thread R UE                   General ADL Comments: pt OOB to stedy then to sink level task. pt demonstrates visual awareness to R lateral lean with correction with mirror. pt requires more (A) without visual input     Vision       Perception     Praxis      Cognition Arousal/Alertness: Awake/alert Behavior During Therapy: WFL for tasks assessed/performed Overall Cognitive Status: Impaired/Different from baseline Area of Impairment: Awareness;Memory                     Memory:  Decreased recall of precautions;Decreased short-term memory   Safety/Judgement: Decreased awareness of safety;Decreased awareness of deficits     General Comments: pt states "i want to walk in the hall now" pt demonstrates decr awareness to R LE deficits.         Exercises     Shoulder Instructions       General Comments      Pertinent Vitals/ Pain       Pain Assessment: No/denies pain  Home Living                                          Prior Functioning/Environment              Frequency  Min 3X/week        Progress Toward Goals  OT Goals(current goals can  now be found in the care plan section)  Progress towards OT goals: Progressing toward goals  Acute Rehab OT Goals Patient Stated Goal: to walk again OT Goal Formulation: With patient Potential to Achieve Goals: Good ADL Goals Pt Will Perform Grooming: with min assist;sitting Pt Will Perform Upper Body Bathing: with min assist;sitting Pt Will Perform Lower Body Bathing: sit to/from stand;with mod assist Pt Will Transfer to Toilet: stand pivot transfer;bedside commode;with min assist Additional ADL Goal #1: pt will static sit EOB for 5 minutes min guard A as precursor to adls  Plan Discharge plan remains appropriate    Co-evaluation                 AM-PAC OT "6 Clicks" Daily Activity     Outcome Measure   Help from another person eating meals?: A Lot Help from another person taking care of personal grooming?: A Lot Help from another person toileting, which includes using toliet, bedpan, or urinal?: A Lot Help from another person bathing (including washing, rinsing, drying)?: A Lot Help from another person to put on and taking off regular upper body clothing?: A Lot Help from another person to put on and taking off regular lower body clothing?: A Lot 6 Click Score: 12    End of Session Equipment Utilized During Treatment: Gait belt  OT Visit Diagnosis: Unsteadiness on feet (R26.81);Muscle weakness (generalized) (M62.81)   Activity Tolerance Patient tolerated treatment well   Patient Left in chair;with call bell/phone within reach;with chair alarm set   Nurse Communication Mobility status;Precautions        Time: 4492-0100 OT Time Calculation (min): 27 min  Charges: OT General Charges $OT Visit: 1 Visit OT Treatments $Self Care/Home Management : 23-37 mins   Jeri Modena, OTR/L  Acute Rehabilitation Services Pager: [phone] Office: [phone] .    Jeri Modena 07/25/2018, 1:06 PM

## 2018-07-25 NOTE — Progress Notes (Signed)
Inpatient Rehabilitation-Admissions Coordinator   Met with pt and his fiance at the bedside to discuss CIR program and caregiver support due to anticipated assist level at DC. Pt's fiance able to provide close to 24/7 A at DC and plans to have assist from pt's daughter as well. AC has begun insurance authorization process for possible admit.   Will follow up once insurance determination has been made.   Jhonnie Garner, OTR/L  Rehab Admissions Coordinator  [phone] 07/25/2018 5:49 PM

## 2018-07-26 ENCOUNTER — Encounter (HOSPITAL_COMMUNITY): Payer: Self-pay | Admitting: *Deleted

## 2018-07-26 ENCOUNTER — Inpatient Hospital Stay (HOSPITAL_COMMUNITY)
Admission: RE | Admit: 2018-07-26 | Discharge: 2018-08-18 | DRG: 057 | Disposition: A | Discharge status: Home Health | Payer: Medicare Other | Source: Intra-hospital | Attending: Physical Medicine & Rehabilitation | Admitting: Physical Medicine & Rehabilitation

## 2018-07-26 ENCOUNTER — Other Ambulatory Visit: Payer: Self-pay

## 2018-07-26 DIAGNOSIS — Z8546 Personal history of malignant neoplasm of prostate: Secondary | ICD-10-CM

## 2018-07-26 DIAGNOSIS — Z9079 Acquired absence of other genital organ(s): Secondary | ICD-10-CM | POA: Diagnosis not present

## 2018-07-26 DIAGNOSIS — F17210 Nicotine dependence, cigarettes, uncomplicated: Secondary | ICD-10-CM | POA: Diagnosis present

## 2018-07-26 DIAGNOSIS — D649 Anemia, unspecified: Secondary | ICD-10-CM | POA: Diagnosis not present

## 2018-07-26 DIAGNOSIS — I619 Nontraumatic intracerebral hemorrhage, unspecified: Secondary | ICD-10-CM | POA: Diagnosis not present

## 2018-07-26 DIAGNOSIS — E785 Hyperlipidemia, unspecified: Secondary | ICD-10-CM | POA: Diagnosis present

## 2018-07-26 DIAGNOSIS — M109 Gout, unspecified: Secondary | ICD-10-CM | POA: Diagnosis present

## 2018-07-26 DIAGNOSIS — I610 Nontraumatic intracerebral hemorrhage in hemisphere, subcortical: Principal | ICD-10-CM

## 2018-07-26 DIAGNOSIS — H409 Unspecified glaucoma: Secondary | ICD-10-CM | POA: Diagnosis present

## 2018-07-26 DIAGNOSIS — I69112 Visuospatial deficit and spatial neglect following nontraumatic intracerebral hemorrhage: Secondary | ICD-10-CM

## 2018-07-26 DIAGNOSIS — B1920 Unspecified viral hepatitis C without hepatic coma: Secondary | ICD-10-CM

## 2018-07-26 DIAGNOSIS — R29810 Facial weakness: Secondary | ICD-10-CM | POA: Diagnosis present

## 2018-07-26 DIAGNOSIS — R52 Pain, unspecified: Secondary | ICD-10-CM | POA: Diagnosis not present

## 2018-07-26 DIAGNOSIS — G8929 Other chronic pain: Secondary | ICD-10-CM | POA: Diagnosis present

## 2018-07-26 DIAGNOSIS — R451 Restlessness and agitation: Secondary | ICD-10-CM | POA: Diagnosis present

## 2018-07-26 DIAGNOSIS — Z888 Allergy status to other drugs, medicaments and biological substances status: Secondary | ICD-10-CM

## 2018-07-26 DIAGNOSIS — Z880 Allergy status to penicillin: Secondary | ICD-10-CM

## 2018-07-26 DIAGNOSIS — I10 Essential (primary) hypertension: Secondary | ICD-10-CM | POA: Diagnosis present

## 2018-07-26 DIAGNOSIS — Z720 Tobacco use: Secondary | ICD-10-CM | POA: Diagnosis present

## 2018-07-26 DIAGNOSIS — M75102 Unspecified rotator cuff tear or rupture of left shoulder, not specified as traumatic: Secondary | ICD-10-CM | POA: Diagnosis not present

## 2018-07-26 DIAGNOSIS — K2970 Gastritis, unspecified, without bleeding: Secondary | ICD-10-CM | POA: Diagnosis not present

## 2018-07-26 DIAGNOSIS — D62 Acute posthemorrhagic anemia: Secondary | ICD-10-CM | POA: Diagnosis present

## 2018-07-26 DIAGNOSIS — I69118 Other symptoms and signs involving cognitive functions following nontraumatic intracerebral hemorrhage: Secondary | ICD-10-CM

## 2018-07-26 DIAGNOSIS — K2100 Gastro-esophageal reflux disease with esophagitis, without bleeding: Secondary | ICD-10-CM

## 2018-07-26 DIAGNOSIS — F5104 Psychophysiologic insomnia: Secondary | ICD-10-CM | POA: Diagnosis present

## 2018-07-26 DIAGNOSIS — D500 Iron deficiency anemia secondary to blood loss (chronic): Secondary | ICD-10-CM | POA: Diagnosis not present

## 2018-07-26 DIAGNOSIS — Z820 Family history of epilepsy and other diseases of the nervous system: Secondary | ICD-10-CM | POA: Diagnosis not present

## 2018-07-26 DIAGNOSIS — K449 Diaphragmatic hernia without obstruction or gangrene: Secondary | ICD-10-CM | POA: Diagnosis present

## 2018-07-26 DIAGNOSIS — I69354 Hemiplegia and hemiparesis following cerebral infarction affecting left non-dominant side: Secondary | ICD-10-CM

## 2018-07-26 DIAGNOSIS — G4700 Insomnia, unspecified: Secondary | ICD-10-CM

## 2018-07-26 DIAGNOSIS — R4781 Slurred speech: Secondary | ICD-10-CM | POA: Diagnosis present

## 2018-07-26 DIAGNOSIS — Z8249 Family history of ischemic heart disease and other diseases of the circulatory system: Secondary | ICD-10-CM | POA: Diagnosis not present

## 2018-07-26 DIAGNOSIS — M25512 Pain in left shoulder: Secondary | ICD-10-CM | POA: Diagnosis not present

## 2018-07-26 DIAGNOSIS — I69151 Hemiplegia and hemiparesis following nontraumatic intracerebral hemorrhage affecting right dominant side: Principal | ICD-10-CM

## 2018-07-26 DIAGNOSIS — Z8619 Personal history of other infectious and parasitic diseases: Secondary | ICD-10-CM

## 2018-07-26 DIAGNOSIS — M549 Dorsalgia, unspecified: Secondary | ICD-10-CM | POA: Diagnosis present

## 2018-07-26 DIAGNOSIS — K257 Chronic gastric ulcer without hemorrhage or perforation: Secondary | ICD-10-CM | POA: Diagnosis not present

## 2018-07-26 DIAGNOSIS — I639 Cerebral infarction, unspecified: Secondary | ICD-10-CM

## 2018-07-26 DIAGNOSIS — Z800 Family history of malignant neoplasm of digestive organs: Secondary | ICD-10-CM

## 2018-07-26 DIAGNOSIS — Z9119 Patient's noncompliance with other medical treatment and regimen: Secondary | ICD-10-CM

## 2018-07-26 LAB — BASIC METABOLIC PANEL
Anion gap: 9 (ref 5–15)
BUN: 20 mg/dL (ref 8–23)
CO2: 19 mmol/L — ABNORMAL LOW (ref 22–32)
Calcium: 8.4 mg/dL — ABNORMAL LOW (ref 8.9–10.3)
Chloride: 112 mmol/L — ABNORMAL HIGH (ref 98–111)
Creatinine, Ser: 0.96 mg/dL (ref 0.61–1.24)
GFR calc Af Amer: >60 mL/min (ref >60)
GFR calc non Af Amer: >60 mL/min (ref >60)
Glucose, Bld: 114 mg/dL — ABNORMAL HIGH (ref 70–99)
Potassium: 4.1 mmol/L (ref 3.5–5.1)
Sodium: 140 mmol/L (ref 135–145)

## 2018-07-26 LAB — CBC
HCT: 27.8 % — ABNORMAL LOW (ref 39.0–52.0)
Hemoglobin: 7.7 g/dL — ABNORMAL LOW (ref 13.0–17.0)
MCH: 22.1 pg — ABNORMAL LOW (ref 26.0–34.0)
MCHC: 27.7 g/dL — ABNORMAL LOW (ref 30.0–36.0)
MCV: 79.7 fL — ABNORMAL LOW (ref 80.0–100.0)
Platelets: 295 10*3/uL (ref 150–400)
RBC: 3.49 MIL/uL — ABNORMAL LOW (ref 4.22–5.81)
RDW: 18.4 % — ABNORMAL HIGH (ref 11.5–15.5)
WBC: 5.6 10*3/uL (ref 4.0–10.5)
nRBC: 0 % (ref 0.0–0.2)

## 2018-07-26 MED ORDER — AMLODIPINE BESYLATE 10 MG PO TABS
10.0000 mg | ORAL_TABLET | Freq: Every day | ORAL | Status: DC
  Administered 2018-07-27 – 2018-08-18 (×23): 10 mg via ORAL
  Filled 2018-07-26 (×23): qty 1

## 2018-07-26 MED ORDER — FERROUS SULFATE 325 (65 FE) MG PO TABS
325.0000 mg | ORAL_TABLET | Freq: Three times a day (TID) | ORAL | Status: DC
  Administered 2018-07-27 – 2018-07-28 (×5): 325 mg via ORAL
  Filled 2018-07-26 (×5): qty 1

## 2018-07-26 MED ORDER — SENNOSIDES-DOCUSATE SODIUM 8.6-50 MG PO TABS
1.0000 | ORAL_TABLET | Freq: Two times a day (BID) | ORAL | Status: DC
  Administered 2018-07-26 – 2018-08-18 (×46): 1 via ORAL
  Filled 2018-07-26 (×46): qty 1

## 2018-07-26 MED ORDER — ENOXAPARIN SODIUM 40 MG/0.4ML ~~LOC~~ SOLN
40.0000 mg | SUBCUTANEOUS | Status: DC
  Administered 2018-07-26: 40 mg via SUBCUTANEOUS
  Filled 2018-07-26: qty 0.4

## 2018-07-26 MED ORDER — PROCHLORPERAZINE EDISYLATE 10 MG/2ML IJ SOLN: 5.0000 mg | Freq: Four times a day (QID) | INTRAMUSCULAR | Status: DC | PRN

## 2018-07-26 MED ORDER — FLEET ENEMA 7-19 GM/118ML RE ENEM: 1.0000 | ENEMA | Freq: Once | RECTAL | Status: DC | PRN

## 2018-07-26 MED ORDER — DIPHENHYDRAMINE HCL 12.5 MG/5ML PO ELIX: 12.5000 mg | ORAL_SOLUTION | Freq: Four times a day (QID) | ORAL | Status: DC | PRN

## 2018-07-26 MED ORDER — TRAZODONE HCL 50 MG PO TABS
25.0000 mg | ORAL_TABLET | Freq: Every evening | ORAL | Status: DC | PRN
  Administered 2018-07-26: 50 mg via ORAL
  Filled 2018-07-26: qty 1

## 2018-07-26 MED ORDER — GUAIFENESIN-DM 100-10 MG/5ML PO SYRP: 5.0000 mL | ORAL_SOLUTION | Freq: Four times a day (QID) | ORAL | Status: DC | PRN

## 2018-07-26 MED ORDER — PROCHLORPERAZINE MALEATE 5 MG PO TABS: 5.0000 mg | ORAL_TABLET | Freq: Four times a day (QID) | ORAL | Status: DC | PRN

## 2018-07-26 MED ORDER — LISINOPRIL 40 MG PO TABS
40.0000 mg | ORAL_TABLET | Freq: Every day | ORAL | Status: DC
  Administered 2018-07-27 – 2018-08-18 (×23): 40 mg via ORAL
  Filled 2018-07-26 (×23): qty 1

## 2018-07-26 MED ORDER — SENNOSIDES-DOCUSATE SODIUM 8.6-50 MG PO TABS: 1.0000 | ORAL_TABLET | Freq: Two times a day (BID) | ORAL | Status: DC

## 2018-07-26 MED ORDER — PROCHLORPERAZINE 25 MG RE SUPP: 12.5000 mg | Freq: Four times a day (QID) | RECTAL | Status: DC | PRN

## 2018-07-26 MED ORDER — ALUM & MAG HYDROXIDE-SIMETH 200-200-20 MG/5ML PO SUSP: 30.0000 mL | ORAL | Status: DC | PRN

## 2018-07-26 MED ORDER — ZOLPIDEM TARTRATE 5 MG PO TABS
10.0000 mg | ORAL_TABLET | Freq: Every evening | ORAL | Status: DC | PRN
  Administered 2018-07-26 – 2018-08-01 (×7): 10 mg via ORAL
  Filled 2018-07-26 (×7): qty 2

## 2018-07-26 MED ORDER — TRAZODONE HCL 50 MG PO TABS
50.0000 mg | ORAL_TABLET | Freq: Every day | ORAL | Status: DC
  Administered 2018-07-26 – 2018-07-31 (×6): 50 mg via ORAL
  Filled 2018-07-26 (×6): qty 1

## 2018-07-26 MED ORDER — QUETIAPINE FUMARATE 25 MG PO TABS
12.5000 mg | ORAL_TABLET | Freq: Two times a day (BID) | ORAL | Status: DC
  Administered 2018-07-26 – 2018-08-01 (×12): 12.5 mg via ORAL
  Filled 2018-07-26 (×12): qty 1

## 2018-07-26 MED ORDER — POLYETHYLENE GLYCOL 3350 17 G PO PACK
17.0000 g | PACK | Freq: Every day | ORAL | Status: DC | PRN
  Administered 2018-07-27: 17 g via ORAL
  Filled 2018-07-26: qty 1

## 2018-07-26 MED ORDER — CLONIDINE HCL 0.1 MG PO TABS: 0.1000 mg | ORAL_TABLET | ORAL | Status: DC | PRN

## 2018-07-26 MED ORDER — ACETAMINOPHEN 325 MG PO TABS
325.0000 mg | ORAL_TABLET | ORAL | Status: DC | PRN
  Administered 2018-07-27 – 2018-08-04 (×11): 650 mg via ORAL
  Filled 2018-07-26 (×10): qty 2

## 2018-07-26 MED ORDER — QUETIAPINE FUMARATE 25 MG PO TABS: 12.5000 mg | ORAL_TABLET | Freq: Two times a day (BID) | ORAL | Status: DC

## 2018-07-26 MED ORDER — BISACODYL 10 MG RE SUPP: 10.0000 mg | Freq: Every day | RECTAL | Status: DC | PRN

## 2018-07-26 MED ORDER — PANTOPRAZOLE SODIUM 40 MG PO TBEC
40.0000 mg | DELAYED_RELEASE_TABLET | Freq: Two times a day (BID) | ORAL | Status: DC
  Administered 2018-07-27 – 2018-08-18 (×45): 40 mg via ORAL
  Filled 2018-07-26 (×47): qty 1

## 2018-07-26 MED ORDER — TRAZODONE HCL 100 MG PO TABS: 100.0000 mg | ORAL_TABLET | Freq: Every day | ORAL | Status: DC

## 2018-07-26 NOTE — Progress Notes (Signed)
Patient admitted to 4W-11. Patient has family at bedside. Pt alert and oriented.No complaints of pain.Patient oriented to safety plan and call bell is within reach. Continue plan of care.

## 2018-07-26 NOTE — Progress Notes (Signed)
PMR Admission Coordinator Pre-Admission Assessment  Patient: [name] is an 68 y.o., male MRN: [id number] DOB: 19-Oct-1950 Height: 5\' 9"  (175.3 cm) Weight: 89.2 kg                                                                                                                                                  Insurance Information HMO: yes    PPO:      PCP:      IPA:      80/20:      OTHER:  PRIMARY: U[employer] Medicare      Policy#: [id number]      Subscriber: Patient CM Name: Lenna Sciara     Phone#: [phone]     Fax#: did not provide direct fax Pre-Cert#: [id number]      Employer: [employer] [id number] provided by Lenna Sciara; Updates due to Dorthula Nettles with clinicals due within 7 days of admission (by 08/01/18). Dorthula Nettles (f): [phone]; (p): [phone] Benefits:  Phone #: NA     Name: [email or web address]  Eff. Date: 07/19/18     Deduct: $0      Out of Pocket Max: $6,700      Life Max: NA CIR: $225/admission *(see below)      SNF: $0/day for days 1-20, $176/day for days 21-100; 100 day limit Outpatient: per necessity     Co-Pay: $0/visist Home Health: 100%, per necessity     Co-Pay:  DME: 80%, per necessity     Co-Pay: 20% Providers:   *Pt has QMB-has Medicaid; All copays listed are covered by Medicaid, so patient is covered at 100% with dual eligibility status.   SECONDARY: Medicaid       Policy#: [id number]     Subscriber: Patient Coverage Code: [id number]     CM Name:       Phone#:      Fax#:  Pre-Cert#:       Employer:  Benefits:  Phone #: [phone]     Name:  Eff. Date: verified eligibility on 07/26/18     Deduct:       Out of Pocket Max:       Life Max:  CIR:       SNF:  Outpatient:      Co-Pay:  Home Health:       Co-Pay:  DME:      Co-Pay:   Medicaid Application Date:       Case Manager:  Disability Application Date:       Case Worker:   Emergency Contact Information         Contact Information    Name Relation Home Work Mobile   [place] Daughter   [phone]    [place],Doris Sister   [phone]   [place], [place] Brother   [phone]   [place], [place] other   [phone]  [place], [place]     Current Medical History  Patient Admitting Diagnosis:Thalamic intracranial hemorrhage  History of Present Illness: CBJ:SEGBT Wayburn Shaler is a 68 year old male with history of prior CVA with residual left hemiparesis, hep C, HTN, S OB, polysubstance abuse; who was admitted on 07/20/2018 with worsening of slurred speech,right-sided weakness and right facial droop. Patient was confused and agitated at admission and blood pressure noted to be elevated- 200/92. UDS + THC. CT head done showing left thalamic hemorrhage with mild edema and evidence of chronic ischemic microangiopathy. He was started on Cleviprex and this has been weaned off as blood pressure control improved CTA head/neck revealed atherosclerotic disease at both carotid bifurcation without stenosis and 25% stenosis right V4 and 50% stenosis left V4 segment. Dr. Leonie Man felt that bleed hypertensive in nature and ASA discontinued. He continued to have issues with agitation question due to alcohol withdrawal ---Seroquel scheduledand treated with CIWA protocol.Patient has been educated on cessation of tobacco as well as THC. Mentation is improving but patient continues to be limited by moderate cognitive deficits affecting safety awareness, memory as well as reports of right visual field deficits, right hemiplegia with right lean affecting mobility and ADLs therapy.CIR recommended due to functional deficits. Pt is to be admitted to CIR on 07/26/18.   Complete NIHSS TOTAL: 8  Past Medical History      Past Medical History:  Diagnosis Date  . Arthritis   . [place] ulcer, chronic 01/11/2018  . Chronic back pain   . GERD (gastroesophageal reflux disease)    uses Baking Soda  . GIB (gastrointestinal bleeding) 2012  . Glaucoma    right eye   . Headache    occasionally  . Hepatitis C   . Hiatal hernia with gastroesophageal reflux disease and esophagitis 01/05/2018  . History of blood transfusion    no abnormal reaction noted  . History of gout    doesn't take any meds  . Hyperlipidemia    not on any meds  . Hypertension    takes Amlodipine and Lisinopril daily  . Insomnia    takes Ambien nightly  . Ischemic colitis ([place]) 2012  . Joint pain   . Nocturia   . Numbness    both legs occasionally  . PONV (postoperative nausea and vomiting)   . Prostate cancer ([place])   . Shortness of breath dyspnea    rarely but when notices he can be lying/sitting/exertion.Dr.Hochrein is aware per pt  . Stroke ([place]) 2016   . Urinary frequency   . Urinary urgency     Family History  family history includes Alzheimer's disease in his father; CAD in his brother; Cancer in his mother; Heart disease (age of onset: 34) in his sister; Heart failure (age of onset: 57) in his brother.  Prior Rehab/Hospitalizations:  Has the patient had major surgery during 100 days prior to admission? No  Current Medications   Current Facility-Administered Medications:  .   stroke: mapping our early stages of recovery book, , Does not apply, Once, Sethi, Pramod S, MD .  0.9 %  sodium chloride infusion, , Intravenous, PRN, Garvin Fila, MD, Stopped at 07/24/18 [phone] .  acetaminophen (TYLENOL) tablet 650 mg, 650 mg, Oral, Q4H PRN **OR** acetaminophen (TYLENOL) solution 650 mg, 650 mg, Per Tube, Q4H PRN **OR** acetaminophen (TYLENOL) suppository 650 mg, 650 mg, Rectal, Q4H PRN, Leonie Man, Pramod S, MD .  amLODipine (NORVASC) tablet 10 mg, 10 mg, Oral, Daily, Garvin Fila, MD,  10 mg at 07/26/18 0950 .  haloperidol lactate (HALDOL) injection 2 mg, 2 mg, Intramuscular, Q6H PRN, Garvin Fila, MD, 2 mg at 07/23/18 0917 .  hydrALAZINE (APRESOLINE) injection 20 mg, 20 mg, Intravenous, Q8H PRN, Garvin Fila, MD, 20 mg at 07/24/18 0414 .   labetalol (NORMODYNE,TRANDATE) injection 10 mg, 10 mg, Intravenous, Once, Antony Contras S, MD .  labetalol (NORMODYNE,TRANDATE) injection 20 mg, 20 mg, Intravenous, Q2H PRN, Garvin Fila, MD, 20 mg at 07/24/18 0511 .  lisinopril (PRINIVIL,ZESTRIL) tablet 40 mg, 40 mg, Oral, Daily, Garvin Fila, MD, 40 mg at 07/26/18 0950 .  metoprolol tartrate (LOPRESSOR) injection 5 mg, 5 mg, Intravenous, Q5 min PRN, Garvin Fila, MD, 5 mg at 07/23/18 0900 .  pantoprazole (PROTONIX) EC tablet 40 mg, 40 mg, Oral, QHS, Garvin Fila, MD, 40 mg at 07/25/18 2052 .  QUEtiapine (SEROQUEL) tablet 12.5 mg, 12.5 mg, Oral, BID, Garvin Fila, MD, 12.5 mg at 07/26/18 0950 .  senna-docusate (Senokot-S) tablet 1 tablet, 1 tablet, Oral, BID, Garvin Fila, MD, 1 tablet at 07/26/18 0951 .  traZODone (DESYREL) tablet 100 mg, 100 mg, Oral, QHS, Biby, Sharon L, NP, 100 mg at 07/25/18 2052  Patients Current Diet:     Diet Order                  Diet regular Room service appropriate? Yes; Fluid consistency: Thin  Diet effective now               Precautions / Restrictions Precautions Precautions: Fall Precaution Comments: right hemiplegia, right lean Restrictions Weight Bearing Restrictions: No   Has the patient had 2 or more falls or a fall with injury in the past year?No  Prior Activity Level Community (5-7x/wk): not working PTA; able to drive. Got out of the house approx 2-3x/week   [place] / [place] Devices/Equipment: None Home Equipment: Grab bars - tub/shower, Radio producer - single point  Prior Device Use: Indicate devices/aids used by the patient prior to current illness, exacerbation or injury? single point cane  Prior Functional Level Prior Function Level of Independence: Independent with assistive device(s) Comments: pt reports he normally walks with a cane and cares for himself  Self Care: Did the patient need help bathing, dressing, using the  toilet or eating?  Independent  Indoor Mobility: Did the patient need assistance with walking from room to room (with or without device)? Independent  Stairs: Did the patient need assistance with internal or external stairs (with or without device)? Independent  Functional Cognition: Did the patient need help planning regular tasks such as shopping or remembering to take medications? Needed some help  Current Functional Level Cognition  Arousal/Alertness: Awake/alert Overall Cognitive Status: Impaired/Different from baseline Current Attention Level: Selective Orientation Level: Oriented X4 Following Commands: Follows one step commands with increased time Safety/Judgement: Decreased awareness of safety, Decreased awareness of deficits General Comments: pt states "i want to walk in the hall now" pt demonstrates decr awareness to R LE deficits.  Attention: Sustained Sustained Attention: Appears intact Memory: Impaired Awareness: Impaired Awareness Impairment: Intellectual impairment Problem Solving: Impaired Problem Solving Impairment: Verbal complex, Functional complex Executive Function: Reasoning Behaviors: Restless    Extremity Assessment (includes Sensation/Coordination)  Upper Extremity Assessment: RUE deficits/detail RUE Deficits / Details: pt with decr coordination, gross grasp and fine motor with adl task this session. pt with shoulder flexion WFL, elbow flexion WFL, wrist flexion / extension, grasp 3 out 5  [place]  IV RUE Coordination: decreased fine motor, decreased gross motor LUE Deficits / Details: decr shoulder fleixon and reports "its been like that " pt with 30 degrees shoulder flexion.   Lower Extremity Assessment: Defer to PT evaluation RLE Deficits / Details: 1/5 hip flexion, 3/5 knee extension LLE Deficits / Details: grossly 3/5 hip flexion and knee extension. Pt reports equal sensation    ADLs  Overall ADL's : Needs  assistance/impaired Eating/Feeding: NPO Grooming: Wash/dry face, Applying deodorant, Sitting, Minimal assistance(Stedy ) Grooming Details (indicate cue type and reason): pt able to use mirror for self correction and posture. pt dropping wash cloth in R hand iniitally and using more of a wrapping around hand method to maintain grasp. pt with bil UE use to clean face with MOD (A) from therapist due to lateral lean R Upper Body Bathing: Moderate assistance, Sitting Upper Body Bathing Details (indicate cue type and reason): sink level stedy Lower Body Bathing: Maximal assistance, Sit to/from stand Lower Body Bathing Details (indicate cue type and reason): pt able to wash anterior peri area Upper Body Dressing : Moderate assistance, Sitting Upper Body Dressing Details (indicate cue type and reason): stedy (A) to thread R UE Lower Body Dressing: Total assistance Toilet Transfer: +2 for physical assistance, Maximal assistance General ADL Comments: pt OOB to stedy then to sink level task. pt demonstrates visual awareness to R lateral lean with correction with mirror. pt requires more (A) without visual input    Mobility  Overal bed mobility: Needs Assistance Bed Mobility: Supine to Sit Supine to sit: Min assist, HOB elevated General bed mobility comments: pt exiting on R side of bed with L Side initiation. pt needs (A) to rotate to EOB and elevate trunk.  Presents with posterior lean and required cues for weight shifting forward.      Transfers  Overall transfer level: Needs assistance Equipment used: Rolling walker (2 wheeled) Transfers: Sit to/from Stand Sit to Stand: Mod assist, +2 physical assistance Stand pivot transfers: Max assist, +2 physical assistance General transfer comment: Pt required cues for hand placement to push into standing.      Ambulation / Gait / Stairs / Wheelchair Mobility  Ambulation/Gait Ambulation/Gait assistance: Mod assist, +2 physical assistance Gait  Distance (Feet): 25 Feet Assistive device: Rolling walker (2 wheeled) Gait Pattern/deviations: Step-to pattern, Decreased stance time - right General Gait Details: Cues for weight shifting and step length.  Pt required assistance to weight shift on R,   Gait velocity interpretation: <1.31 ft/sec, indicative of household ambulator    Posture / Balance Dynamic Sitting Balance Sitting balance - Comments: right lateral lean Balance Overall balance assessment: Needs assistance Sitting-balance support: Bilateral upper extremity supported, Feet supported Sitting balance-Leahy Scale: Poor Sitting balance - Comments: right lateral lean Standing balance support: Bilateral upper extremity supported Standing balance-Leahy Scale: Poor    Special needs/care consideration BiPAP/CPAP: no CPM: no Continuous Drip IV: 0.9% sodium chloride infusion  Dialysis: no       Days: no Life Vest: no Oxygen: no Special Bed: no Trach Size: no Wound Vac (area): no      Location: no Skin: abrasion to scrotum   Bowel mgmt: last BM: 07/24/18 Bladder mgmt:continent  Diabetic mgmt: no     Previous Home Environment Living Arrangements: Spouse/significant other Available Help at Discharge: Friend(s), Available 24 hours/day Type of Home: Apartment Home Layout: One level Home Access: Level entry Bathroom Shower/Tub: Multimedia programmer: Handicapped height Home Care Services: No Additional Comments: Pt lives with girlfriend who  cares for a 2 and 4yo during the day  Discharge Living Setting Plans for Discharge Living Setting: Patient's home, Lives with (comment), Apartment(lives iwth fiance) Type of Home at Discharge: Apartment Discharge Home Layout: One level Discharge Home Access: Stairs to enter Entrance Stairs-Rails: Right, Left(rail in middle of stairs) Entrance Stairs-Number of Steps: 3 Discharge Bathroom Shower/Tub: Walk-in shower Discharge Bathroom Toilet: Standard Discharge Bathroom  Accessibility: Yes How Accessible: Accessible via walker Does the patient have any problems obtaining your medications?: Yes (Describe)(possibly, fiance does not drive; pt drove PTA. )  Social/Family/Support Systems Patient Roles: Other (Comment)(does not work, has fiance. has daugther ) Contact Information: [name] (fiance): [phone]; [phone]; daughter Byrd Hesselbach): [phone] Anticipated Caregiver: Fiance and daugther  Anticipated Caregiver's Contact Information: see above Ability/Limitations of Caregiver: Min A Caregiver Availability: 24/7 Discharge Plan Discussed with Primary Caregiver: Yes Is Caregiver In Agreement with Plan?: Yes Does Caregiver/Family have Issues with Lodging/Transportation while Pt is in Rehab?: No   Goals/Additional Needs Patient/Family Goal for Rehab: PT: Min A; OT: Min A; SLP: Supervision Expected length of stay: 15-18 days Cultural Considerations: [employer] Dietary Needs: regular diet, thin liquids Equipment Needs: TBD Pt/Family Agrees to Admission and willing to participate: Yes Program Orientation Provided & Reviewed with Pt/Caregiver Including Roles  & Responsibilities: Yes(pt, daugther, and fiance)  Barriers to Discharge: Home environment access/layout  Barriers to Discharge Comments: 3 steps to enter   Decrease burden of Care through IP rehab admission: NA   Possible need for SNF placement upon discharge: Not anticipated; Spoke with fiance and daughter who are able to provide 24/7 A at home if pt able to achieve recommended goal levels.    Patient Condition: This patient's medical and functional status has changed since the consult dated 07/23/18 in which the Rehabilitation Physician determined and documented that the patient was potentially appropriate for intensive rehabilitative care in an inpatient rehabilitation facility. Issues have been addressed and update has been discussed with Dr. Posey Pronto and patient now appropriate for  inpatient rehabilitation. Pt is no longer agitated, much more cooperative, and is tolerating therapies therapies well. Will admit to inpatient rehab today.   Preadmission Screen Completed By:  Jhonnie Garner, 07/26/2018 2:57 PM ______________________________________________________________________   Discussed status with Dr. Posey Pronto on 07/26/2018 at 2:57PM and received telephone approval for admission today.  Admission Coordinator:  Jhonnie Garner, time 2:57PM/Date 07/26/18.            Cosigned by: Jamse Arn, MD at 07/26/2018 3:24 PM  Revision History

## 2018-07-26 NOTE — H&P (Signed)
Physical Medicine and Rehabilitation Admission H&P    Chief Complaint  Patient presents with  .  Functional deficits due to [name]    HPI: [name] is a 68 year old male with history of prior CVA with residual left hemiparesis, hep C, HTN, S OB, polysubstance abuse; who was admitted on 07/20/2018 with worsening of slurred speech, right-sided weakness and right facial droop.  History taken from chart review and patient.  Patient was confused and agitated at admission and blood pressure noted to be elevated- 200/92.   UDS + THC. CT head done reviewed, showing left thalamic hemorrhage.  Per report, left thalamic hemorrhage with mild edema and evidence of chronic ischemic microangiopathy.  He was started on Cleviprex and this has been weaned off as blood pressure control improved CTA head/neck revealed atherosclerotic disease at both carotid bifurcation without stenosis and 25% stenosis right V4 and 50% stenosis left V4 segment.  Dr. Leonie Man felt that bleed hypertensive in nature and ASA discontinued.  He continued to have issues with agitation question due to alcohol withdrawal ---Seroquel scheduled and treated with CIWA protocol.   Patient has been educated on cessation of tobacco as well as THC.   Mentation is improving but patient continues to be limited by moderate cognitive deficits affecting safety awareness, memory as well as reports of right visual field deficits, right hemiplegia with right lean affecting mobility and ADLs therapy. CIR recommended due to functional deficits   Review of Systems  Constitutional: Negative for chills and fever.  HENT: Negative for hearing loss.   Eyes: Negative for blurred vision and double vision.  Respiratory: Negative for sputum production and shortness of breath.   Cardiovascular: Negative for chest pain and palpitations.  Gastrointestinal: Negative for heartburn and nausea.  Genitourinary: Negative for dysuria and urgency.  Musculoskeletal:  Positive for joint pain (left hip and shoulder pain) and myalgias.  Skin: Positive for rash.  Neurological: Positive for sensory change and weakness. Negative for dizziness and headaches.  Psychiatric/Behavioral: The patient has insomnia (reports that he does not sleep at nights. ).   All other systems reviewed and are negative.    Past Medical History:  Diagnosis Date  . Arthritis   . Cameron ulcer, chronic 01/11/2018  . Chronic back pain   . GERD (gastroesophageal reflux disease)    uses Baking Soda  . GIB (gastrointestinal bleeding) 2012  . Glaucoma    right eye  . Headache    occasionally  . Hepatitis C   . Hiatal hernia with gastroesophageal reflux disease and esophagitis 01/05/2018  . History of blood transfusion    no abnormal reaction noted  . History of gout    doesn't take any meds  . Hyperlipidemia    not on any meds  . Hypertension    takes Amlodipine and Lisinopril daily  . Insomnia    takes Ambien nightly  . Ischemic colitis ([place]) 2012  . Joint pain   . Nocturia   . Numbness    both legs occasionally  . PONV (postoperative nausea and vomiting)   . Prostate cancer ([place])   . Shortness of breath dyspnea    rarely but when notices he can be lying/sitting/exertion.Dr.Hochrein is aware per pt  . Stroke ([place]) 2016   . Urinary frequency   . Urinary urgency     Past Surgical History:  Procedure Laterality Date  . APPENDECTOMY    . BIOPSY  01/11/2018   Procedure: BIOPSY;  Surgeon: Gatha Mayer, MD;  Location: [facility] ENDOSCOPY;  Service: Endoscopy;;  . COLONOSCOPY    . COLONOSCOPY N/A 10/26/2015   Procedure: COLONOSCOPY;  Surgeon: Doran Stabler, MD;  Location: [facility] ENDOSCOPY;  Service: Endoscopy;  Laterality: N/A;  . ESOPHAGOGASTRODUODENOSCOPY    . ESOPHAGOGASTRODUODENOSCOPY N/A 10/26/2015   Procedure: ESOPHAGOGASTRODUODENOSCOPY (EGD);  Surgeon: Doran Stabler, MD;  Location: [facility] ENDOSCOPY;  Service: Endoscopy;  Laterality: N/A;  . ESOPHAGOGASTRODUODENOSCOPY  (EGD) WITH PROPOFOL N/A 01/11/2018   Procedure: ESOPHAGOGASTRODUODENOSCOPY (EGD) WITH PROPOFOL;  Surgeon: Gatha Mayer, MD;  Location: [facility] ENDOSCOPY;  Service: Endoscopy;  Laterality: N/A;  . INGUINAL HERNIA REPAIR Right 11/04/2014   Procedure: RIGHT INGUINAL HERNIA REPAIR WITH MESH;  Surgeon: Jackolyn Confer, MD;  Location: [facility];  Service: General;  Laterality: Right;  . MASS EXCISION N/A 11/04/2014   Procedure: REMOVAL OF RIGHT GROIN SOFT TISSUE MASS;  Surgeon: Jackolyn Confer, MD;  Location: [place];  Service: General;  Laterality: N/A;  . Multiple abdominal surgeries     total of 13  . PROSTATECTOMY    . SMALL INTESTINE SURGERY      Family History  Problem Relation Age of Onset  . Alzheimer's disease Father   . Cancer Mother        Lymph node  . Heart failure Brother 45       Transplant 13 years ago  . CAD Brother   . Heart disease Sister 75       MI    Social History:  Lives with girlfriend. reports that he has been smoking cigarettes < 1 PPD. He has a 25.00 pack-year smoking history. He has never used smokeless tobacco. He reports current alcohol use--beer and/or  Wine "sometimes". He reports that he does not use drugs.     Allergies  Allergen Reactions  . Penicillins Anaphylaxis    Has patient had a PCN reaction causing immediate rash, facial/tongue/throat swelling, SOB or lightheadedness with hypotension: Yes Has patient had a PCN reaction causing severe rash involving mucus membranes or skin necrosis: No Has patient had a PCN reaction that required hospitalization: No Has patient had a PCN reaction occurring within the last 10 years: No If all of the above answers are "NO", then may proceed with Cephalosporin use..  . Sudafed [Pseudoephedrine] Other (See Comments)    Heart "races"    Medications Prior to Admission  Medication Sig Dispense Refill  . amLODipine (NORVASC) 10 MG tablet Take 1 tablet (10 mg total) by mouth daily. 90 tablet 1  . bisacodyl (BISACODYL) 5 MG  EC tablet TAKE 1 TABLET BY MOUTH AS NEEDED FOR MODERATE CONSTIPATION (Patient taking differently: Take 5 mg by mouth daily as needed for moderate constipation. ) 30 tablet 0  . cloNIDine (CATAPRES) 0.1 MG tablet Take 3 tablets (0.3 mg total) by mouth 2 (two) times daily. 60 tablet 0  . Diclofenac Sodium 3 % GEL Place 1 application onto the skin 2 (two) times daily. To affected area. 100 g 0  . DULoxetine (CYMBALTA) 30 MG capsule Take 1 capsule (30 mg total) by mouth 2 (two) times daily. 60 capsule 1  . ferrous sulfate 325 (65 FE) MG tablet TAKE 1 TABLET BY MOUTH THREE TWICE A DAY WITH MEALS (Patient taking differently: Take 325 mg by mouth 3 (three) times daily with meals. ) 90 tablet 3  . hydrochlorothiazide (MICROZIDE) 12.5 MG capsule Take 2 capsules (25 mg total) by mouth daily. 90 capsule 0  . HYDROcodone-acetaminophen (NORCO/VICODIN) 5-325 MG tablet Take 1 tablet by  mouth every 6 (six) hours as needed for moderate pain. 30 tablet 0  . lisinopril (PRINIVIL,ZESTRIL) 40 MG tablet Take 40 mg by mouth daily.    . pantoprazole (PROTONIX) 40 MG tablet TAKE 1 TABLET BY MOUTH TWICE A DAY BEFORE A MEAL (Patient taking differently: Take 40 mg by mouth 2 (two) times daily before a meal. ) 60 tablet 0  . polyethylene glycol (MIRALAX / GLYCOLAX) packet Take 17 g by mouth daily as needed for severe constipation. 14 each 0  . sucralfate (CARAFATE) 1 GM/10ML suspension Take 10 mLs (1 g total) by mouth 4 (four) times daily -  with meals and at bedtime for 15 days. 420 mL 0  . traZODone (DESYREL) 50 MG tablet Take 1 tablet (50 mg total) by mouth at bedtime. 30 tablet 2  . zolpidem (AMBIEN) 10 MG tablet Take 1 tablet (10 mg total) by mouth at bedtime as needed for sleep. 30 tablet 5    Drug Regimen Review  Drug regimen was reviewed and remains appropriate with no significant issues identified  Home: Home Living Family/patient expects to be discharged to:: Private residence Living Arrangements:  Spouse/significant other Available Help at Discharge: Friend(s), Available 24 hours/day Type of Home: Apartment Home Access: Level entry Home Layout: One level Bathroom Shower/Tub: Multimedia programmer: Handicapped height Home Equipment: Grab bars - tub/shower, Radio producer - single point Additional Comments: Pt lives with girlfriend who cares for a 2 and 4yo during the day   Functional History: Prior Function Level of Independence: Independent with assistive device(s) Comments: pt reports he normally walks with a cane and cares for himself  Functional Status:  Mobility: Bed Mobility Overal bed mobility: Needs Assistance Bed Mobility: Supine to Sit Supine to sit: Min assist, HOB elevated General bed mobility comments: pt exiting on R side of bed with L Side initiation. pt needs (A) to rotate to EOB and elevate trunk.  Presents with posterior lean and required cues for weight shifting forward.   Transfers Overall transfer level: Needs assistance Equipment used: Rolling walker (2 wheeled) Transfers: Sit to/from Stand Sit to Stand: Mod assist, +2 physical assistance Stand pivot transfers: Max assist, +2 physical assistance General transfer comment: Pt required cues for hand placement to push into standing.   Ambulation/Gait Ambulation/Gait assistance: Mod assist, +2 physical assistance Gait Distance (Feet): 25 Feet Assistive device: Rolling walker (2 wheeled) Gait Pattern/deviations: Step-to pattern, Decreased stance time - right General Gait Details: Cues for weight shifting and step length.  Pt required assistance to weight shift on R,   Gait velocity interpretation: <1.31 ft/sec, indicative of household ambulator    ADL: ADL Overall ADL's : Needs assistance/impaired Eating/Feeding: NPO Grooming: Wash/dry face, Applying deodorant, Sitting, Minimal assistance(Stedy ) Grooming Details (indicate cue type and reason): pt able to use mirror for self correction and posture. pt  dropping wash cloth in R hand iniitally and using more of a wrapping around hand method to maintain grasp. pt with bil UE use to clean face with MOD (A) from therapist due to lateral lean R Upper Body Bathing: Moderate assistance, Sitting Upper Body Bathing Details (indicate cue type and reason): sink level stedy Lower Body Bathing: Maximal assistance, Sit to/from stand Lower Body Bathing Details (indicate cue type and reason): pt able to wash anterior peri area Upper Body Dressing : Moderate assistance, Sitting Upper Body Dressing Details (indicate cue type and reason): stedy (A) to thread R UE Lower Body Dressing: Total assistance Toilet Transfer: +2 for physical assistance, Maximal  assistance General ADL Comments: pt OOB to stedy then to sink level task. pt demonstrates visual awareness to R lateral lean with correction with mirror. pt requires more (A) without visual input  Cognition: Cognition Overall Cognitive Status: Impaired/Different from baseline Arousal/Alertness: Awake/alert Orientation Level: Oriented X4 Attention: Sustained Sustained Attention: Appears intact Memory: Impaired Awareness: Impaired Awareness Impairment: Intellectual impairment Problem Solving: Impaired Problem Solving Impairment: Verbal complex, Functional complex Executive Function: Reasoning Behaviors: Restless Cognition Arousal/Alertness: Awake/alert Behavior During Therapy: WFL for tasks assessed/performed Overall Cognitive Status: Impaired/Different from baseline Area of Impairment: Awareness, Memory Orientation Level: Disoriented to, Time Current Attention Level: Selective Memory: Decreased recall of precautions, Decreased short-term memory Following Commands: Follows one step commands with increased time Safety/Judgement: Decreased awareness of safety, Decreased awareness of deficits Awareness: Intellectual Problem Solving: Slow processing, Decreased initiation, Difficulty sequencing, Requires  verbal cues, Requires tactile cues General Comments: pt states "i want to walk in the hall now" pt demonstrates decr awareness to R LE deficits.    Blood pressure 118/83, pulse 77, temperature 98.9 F (37.2 C), temperature source Oral, resp. rate 18, height 5\' 9"  (1.753 m), weight 89.2 kg, SpO2 99 %. Physical Exam  Nursing note and vitals reviewed. Constitutional: He is oriented to person, place, and time. He appears well-developed and well-nourished.  HENT:  Head: Normocephalic and atraumatic.  Eyes: EOM are normal. Right eye exhibits no discharge. Left eye exhibits no discharge.  Neck: Normal range of motion. Neck supple.  Cardiovascular: Normal rate and regular rhythm.  Respiratory: Effort normal and breath sounds normal.  GI: Soft. Bowel sounds are normal.  Musculoskeletal:     Comments: No edema or tenderness in extremities  Neurological: He is alert and oriented to person, place, and time.  Speech clear.  Able to follow basic commands without difficulty.  Motor: RUE: 4/5 proximal to distal LUE: 5/5 proximal to distal RLE: HF, KE 3-/5, ADF 4/5 LLE: HF, KE 3+/5, ADF 4/5  Skin: Skin is warm and dry.  Multiple healed lesions (bed bug bites) on bilateral shins  Psychiatric: His affect is blunt. His speech is delayed. He is slowed.    Results for orders placed or performed during the hospital encounter of 07/20/18 (from the past 48 hour(s))  CBC     Status: Abnormal   Collection Time: 07/25/18  6:14 AM  Result Value Ref Range   WBC 5.7 4.0 - 10.5 K/uL   RBC 3.76 (L) 4.22 - 5.81 MIL/uL   Hemoglobin 8.6 (L) 13.0 - 17.0 g/dL    Comment: Reticulocyte Hemoglobin testing may be clinically indicated, consider ordering this additional test [id number]    HCT 29.2 (L) 39.0 - 52.0 %   MCV 77.7 (L) 80.0 - 100.0 fL   MCH 22.9 (L) 26.0 - 34.0 pg   MCHC 29.5 (L) 30.0 - 36.0 g/dL   RDW 18.3 (H) 11.5 - 15.5 %   Platelets 332 150 - 400 K/uL   nRBC 0.0 0.0 - 0.2 %    Comment: Performed at  [facility], [place] [address], [place], [place] [address]  Basic metabolic panel     Status: Abnormal   Collection Time: 07/25/18  6:14 AM  Result Value Ref Range   Sodium 141 135 - 145 mmol/L   Potassium 3.3 (L) 3.5 - 5.1 mmol/L   Chloride 111 98 - 111 mmol/L   CO2 21 (L) 22 - 32 mmol/L   Glucose, Bld 108 (H) 70 - 99 mg/dL   BUN 16 8 -  23 mg/dL   Creatinine, Ser 1.04 0.61 - 1.24 mg/dL   Calcium 8.5 (L) 8.9 - 10.3 mg/dL   GFR calc non Af Amer >60 >60 mL/min   GFR calc Af Amer >60 >60 mL/min   Anion gap 9 5 - 15    Comment: Performed at [facility] [address], [place], [place] [address]  CBC     Status: Abnormal   Collection Time: 07/26/18  5:07 AM  Result Value Ref Range   WBC 5.6 4.0 - 10.5 K/uL   RBC 3.49 (L) 4.22 - 5.81 MIL/uL   Hemoglobin 7.7 (L) 13.0 - 17.0 g/dL   HCT 27.8 (L) 39.0 - 52.0 %   MCV 79.7 (L) 80.0 - 100.0 fL   MCH 22.1 (L) 26.0 - 34.0 pg   MCHC 27.7 (L) 30.0 - 36.0 g/dL   RDW 18.4 (H) 11.5 - 15.5 %   Platelets 295 150 - 400 K/uL   nRBC 0.0 0.0 - 0.2 %    Comment: Performed at [facility], [place] [address] [place], [place] [address]  Basic metabolic panel     Status: Abnormal   Collection Time: 07/26/18  5:07 AM  Result Value Ref Range   Sodium 140 135 - 145 mmol/L   Potassium 4.1 3.5 - 5.1 mmol/L   Chloride 112 (H) 98 - 111 mmol/L   CO2 19 (L) 22 - 32 mmol/L   Glucose, Bld 114 (H) 70 - 99 mg/dL   BUN 20 8 - 23 mg/dL   Creatinine, Ser 0.96 0.61 - 1.24 mg/dL   Calcium 8.4 (L) 8.9 - 10.3 mg/dL   GFR calc non Af Amer >60 >60 mL/min   GFR calc Af Amer >60 >60 mL/min   Anion gap 9 5 - 15    Comment: Performed at [facility], [place] [address] [place], [place] [address]   No results found.     Medical Problem List and Plan: 1.  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 2.  DVT Prophylaxis/Anticoagulation: Pharmaceutical: Lovenox 3.  History of chronic back  pain/headaches/pain Management: Tylenol as needed for now 4. Mood: LCSW to follow for evaluation and support 5. Neuropsych: This patient is not fully capable of making decisions on his own behalf. 6. Skin/Wound Care: Routine pressure relief measures 7. Fluids/Electrolytes/Nutrition: Monitor I/O--recheck BMET in am to monitor K+ levels.  8.  HTN: Monitor blood pressures 4 times daily.  Continue amlodipine and lisinopril-- titrate medications to keep SBP < 9.  Acute on chronic blood loss anemia: 9.0--> 7.7. Will check stool guaiacs and monitor for signs of bleeding.  Resume iron supplement 10. Chronic Insomnia/sleep state misperception: Will monitor sleep wake cycle.  Ttrazodone 100 mg nightly ineffective--resume home regimen of trazodone 50mg   and Ambien 5 mg (for now)   Post Admission Physician Evaluation: 1. Preadmission assessment reviewed and changes made below. 2. Functional deficits secondary  to left thalamic hemorrhage. 3. Patient is admitted to receive collaborative, interdisciplinary care between the physiatrist, rehab nursing staff, and therapy team. 4. Patient's level of medical complexity and substantial therapy needs in context of that medical necessity cannot be provided at a lesser intensity of care such as a SNF. 5. Patient has experienced substantial functional loss from his/her baseline which was documented above under the "Functional History" and "Functional Status" headings.  Judging by the patient's diagnosis, physical exam, and functional history, the patient has potential for functional progress which  will result in measurable gains while on inpatient rehab.  These gains will be of substantial and practical use upon discharge  in facilitating mobility and self-care at the household level. 39. Physiatrist will provide 24 hour management of medical needs as well as oversight of the therapy plan/treatment and provide guidance as appropriate regarding the interaction of the  two. 7. 24 hour rehab nursing will assist with bladder management, safety, skin/wound care, disease management, medication administration and patient education  and help integrate therapy concepts, techniques,education, etc. 8. PT will assess and treat for/with: Lower extremity strength, range of motion, stamina, balance, functional mobility, safety, adaptive techniques and equipment, coping skills, pain control, stroke education. Goals are: Supervision/min a. 9. OT will assess and treat for/with: ADL's, functional mobility, safety, upper extremity strength, adaptive techniques and equipment, ego support, and community reintegration.   Goals are: Supervision/min a. Therapy may proceed with showering this patient. 10. SLP will assess and treat for/with: Cognition.  Goals are: Supervision/mod I. 11. Case Management and Social Worker will assess and treat for psychological issues and discharge planning. 12. Team conference will be held weekly to assess progress toward goals and to determine barriers to discharge. 13. Patient will receive at least 3 hours of therapy per day at least 5 days per week. 14. ELOS: 10-14 days.       15. Prognosis:  good  I have personally performed a face to face diagnostic evaluation, including, but not limited to relevant history and physical exam findings, of this patient and developed relevant assessment and plan.  Additionally, I have reviewed and concur with the physician assistant's documentation above.  The patient's status has not changed. The original post admission physician evaluation remains appropriate, and any changes from the pre-admission screening or documentation from the acute chart are noted above.    Delice Lesch, MD, ABPMR Bary Leriche, PA-C 07/26/2018

## 2018-07-26 NOTE — Progress Notes (Signed)
Physical Medicine and Rehabilitation Consult Reason for Consult: Rehabilitation for stroke Referring Phsyician: Darol Cush is an 68 y.o. male.   HPI: Patient with history of hypertension hepatitis C as well as prior stroke residual left-sided weakness presented to the emergency department on 07/20/2018 with worsening speech and right-sided facial droop.  Patient was noted to be agitated during that time.  CT of the head demonstrated left lateral superior thalamic hemorrhage standing into the periventricular and posterior lentiform nucleus region with mild surrounding edema.  NIH stroke scale was 11 Past the Yale swallow screen, speech therapy evaluation was deferred OT evaluation noted to have plus to max assist level.,  Patient was telling therapy that he did not want any extended rehab, max assist upper body ADLs total assist lower body ADLs, transfers mod assist x2 Urine toxicology THC positive Review of Systems -cannot obtain due to confusion/agitation      Past Medical History:  Diagnosis Date  . Arthritis   . [place] ulcer, chronic 01/11/2018  . Chronic back pain   . GERD (gastroesophageal reflux disease)    uses Baking Soda  . GIB (gastrointestinal bleeding) 2012  . Glaucoma    right eye  . Headache    occasionally  . Hepatitis C   . Hiatal hernia with gastroesophageal reflux disease and esophagitis 01/05/2018  . History of blood transfusion    no abnormal reaction noted  . History of gout    doesn't take any meds  . Hyperlipidemia    not on any meds  . Hypertension    takes Amlodipine and Lisinopril daily  . Insomnia    takes Ambien nightly  . Ischemic colitis ([place]) 2012  . Joint pain   . Nocturia   . Numbness    both legs occasionally  . PONV (postoperative nausea and vomiting)   . Prostate cancer ([place])   . Shortness of breath dyspnea    rarely but when notices he can be lying/sitting/exertion.Dr.Hochrein is aware per pt  .  Stroke ([place]) 2016   . Urinary frequency   . Urinary urgency         Past Surgical History:  Procedure Laterality Date  . APPENDECTOMY    . BIOPSY  01/11/2018   Procedure: BIOPSY;  Surgeon: Gatha Mayer, MD;  Location: [facility] ENDOSCOPY;  Service: Endoscopy;;  . COLONOSCOPY    . COLONOSCOPY N/A 10/26/2015   Procedure: COLONOSCOPY;  Surgeon: Doran Stabler, MD;  Location: [facility] ENDOSCOPY;  Service: Endoscopy;  Laterality: N/A;  . ESOPHAGOGASTRODUODENOSCOPY    . ESOPHAGOGASTRODUODENOSCOPY N/A 10/26/2015   Procedure: ESOPHAGOGASTRODUODENOSCOPY (EGD);  Surgeon: Doran Stabler, MD;  Location: [facility] ENDOSCOPY;  Service: Endoscopy;  Laterality: N/A;  . ESOPHAGOGASTRODUODENOSCOPY (EGD) WITH PROPOFOL N/A 01/11/2018   Procedure: ESOPHAGOGASTRODUODENOSCOPY (EGD) WITH PROPOFOL;  Surgeon: Gatha Mayer, MD;  Location: [facility] ENDOSCOPY;  Service: Endoscopy;  Laterality: N/A;  . INGUINAL HERNIA REPAIR Right 11/04/2014   Procedure: RIGHT INGUINAL HERNIA REPAIR WITH MESH;  Surgeon: Jackolyn Confer, MD;  Location: [place];  Service: General;  Laterality: Right;  . MASS EXCISION N/A 11/04/2014   Procedure: REMOVAL OF RIGHT GROIN SOFT TISSUE MASS;  Surgeon: Jackolyn Confer, MD;  Location: [place];  Service: General;  Laterality: N/A;  . Multiple abdominal surgeries     total of 13  . PROSTATECTOMY    . SMALL INTESTINE SURGERY          Family History  Problem Relation Age of Onset  . Alzheimer's disease  Father   . Cancer Mother        Lymph node  . Heart failure Brother 45       Transplant 13 years ago  . CAD Brother   . Heart disease Sister 47       MI   Social History:  reports that he has been smoking cigarettes. He has a 25.00 pack-year smoking history. He has never used smokeless tobacco. He reports current alcohol use. He reports that he does not use drugs. Allergies:       Allergies  Allergen Reactions  . Penicillins Anaphylaxis    Has patient had a PCN reaction causing  immediate rash, facial/tongue/throat swelling, SOB or lightheadedness with hypotension: Yes Has patient had a PCN reaction causing severe rash involving mucus membranes or skin necrosis: No Has patient had a PCN reaction that required hospitalization: No Has patient had a PCN reaction occurring within the last 10 years: No If all of the above answers are "NO", then may proceed with Cephalosporin use..  . Sudafed [Pseudoephedrine] Other (See Comments)    Heart "races"         Medications Prior to Admission  Medication Sig Dispense Refill  . amLODipine (NORVASC) 10 MG tablet Take 1 tablet (10 mg total) by mouth daily. 90 tablet 1  . bisacodyl (BISACODYL) 5 MG EC tablet TAKE 1 TABLET BY MOUTH AS NEEDED FOR MODERATE CONSTIPATION 30 tablet 0  . cloNIDine (CATAPRES) 0.1 MG tablet Take 3 tablets (0.3 mg total) by mouth 2 (two) times daily. (Patient taking differently: Take 0.1 mg by mouth 2 (two) times daily. ) 60 tablet 0  . Diclofenac Sodium 3 % GEL Place 1 application onto the skin 2 (two) times daily. To affected area. 100 g 0  . DULoxetine (CYMBALTA) 30 MG capsule Take 1 capsule (30 mg total) by mouth 2 (two) times daily. 60 capsule 1  . ferrous sulfate 325 (65 FE) MG tablet TAKE 1 TABLET BY MOUTH THREE TWICE A DAY WITH MEALS 90 tablet 3  . hydrochlorothiazide (MICROZIDE) 12.5 MG capsule Take 2 capsules (25 mg total) by mouth daily. (Patient taking differently: Take 12.5 mg by mouth daily. ) 90 capsule 0  . HYDROcodone-acetaminophen (NORCO/VICODIN) 5-325 MG tablet Take 1 tablet by mouth every 6 (six) hours as needed for moderate pain. 30 tablet 0  . lisinopril (PRINIVIL,ZESTRIL) 40 MG tablet Take 40 mg by mouth daily.    . pantoprazole (PROTONIX) 40 MG tablet TAKE 1 TABLET BY MOUTH TWICE A DAY BEFORE A MEAL 60 tablet 0  . polyethylene glycol (MIRALAX / GLYCOLAX) packet Take 17 g by mouth daily as needed for severe constipation. 14 each 0  . sucralfate (CARAFATE) 1 GM/10ML suspension Take 10  mLs (1 g total) by mouth 4 (four) times daily -  with meals and at bedtime for 15 days. 420 mL 0  . traZODone (DESYREL) 50 MG tablet Take 1 tablet (50 mg total) by mouth at bedtime. 30 tablet 2  . zolpidem (AMBIEN) 10 MG tablet Take 1 tablet (10 mg total) by mouth at bedtime as needed for sleep. 30 tablet 5    Home: Home Living Family/patient expects to be discharged to:: Private residence Living Arrangements: Spouse/significant other Available Help at Discharge: Friend(s), Available 24 hours/day Type of Home: Apartment Home Access: Level entry Home Layout: One level Bathroom Shower/Tub: Multimedia programmer: Handicapped height Home Equipment: Grab bars - tub/shower, Radio producer - single point Additional Comments: Pt lives with girlfriend who cares  for a 2 and 4yo during the day  Functional History: Prior Function Comments: pt reports he normally walks with a cane and cares for himself Functional Status:  Mobility: Ambulation/Gait General Gait Details: unable at this time  ADL:  Cognition: Cognition Overall Cognitive Status: Impaired/Different from baseline Arousal/Alertness: Awake/alert Orientation Level: Oriented X4 Attention: Sustained Sustained Attention: Appears intact Memory: Impaired Awareness: Impaired Awareness Impairment: Intellectual impairment Problem Solving: Impaired Problem Solving Impairment: Verbal complex, Functional complex Executive Function: Reasoning Behaviors: Restless Cognition Arousal/Alertness: Awake/alert Behavior During Therapy: Agitated Overall Cognitive Status: Impaired/Different from baseline Area of Impairment: Orientation, Attention, Following commands, Problem solving, Awareness, Safety/judgement Orientation Level: Disoriented to, Situation, Time Current Attention Level: Selective Following Commands: Follows one step commands with increased time Safety/Judgement: Decreased awareness of deficits, Decreased awareness of  safety Awareness: Intellectual Problem Solving: Slow processing, Decreased initiation, Difficulty sequencing, Requires verbal cues, Requires tactile cues General Comments: pt unable to move RUE consistently with decreased gross motor and even after transfer states he is going home. "i will crawl to the bathroom" Pt lacks awareness to deficits even with cueing. pt with very flat affect    Blood pressure (!) 166/80, pulse (!) 46, temperature 98.4 F (36.9 C), temperature source Oral, resp. rate (!) 33, weight 92.1 kg, SpO2 94 %. Physical Exam  Nursing note and vitals reviewed. Constitutional: He appears well-developed and well-nourished.  HENT:  Head: Normocephalic and atraumatic.  Eyes: Conjunctivae and EOM are normal. Right eye exhibits no discharge. Left eye exhibits no discharge. No scleral icterus.  Respiratory: Effort normal. No stridor. No respiratory distress.  Neurological: He is alert.  Psychiatric: His affect is inappropriate. He is agitated. Cognition and memory are impaired. He expresses inappropriate judgment.  Patient agitated, refuses to eat in chair.  Complains about wrist restraint  We discussed his need for 2 person assist for his mobility.  He states he can do this at home and does not need therapy.  Patient declines any further examination  LabResultsLast24Hours       Results for orders placed or performed during the hospital encounter of 07/20/18 (from the past 24 hour(s))  CBC     Status: Abnormal   Collection Time: 07/23/18  3:14 AM  Result Value Ref Range   WBC 11.3 (H) 4.0 - 10.5 K/uL   RBC 4.25 4.22 - 5.81 MIL/uL   Hemoglobin 9.4 (L) 13.0 - 17.0 g/dL   HCT 32.5 (L) 39.0 - 52.0 %   MCV 76.5 (L) 80.0 - 100.0 fL   MCH 22.1 (L) 26.0 - 34.0 pg   MCHC 28.9 (L) 30.0 - 36.0 g/dL   RDW 18.8 (H) 11.5 - 15.5 %   Platelets 430 (H) 150 - 400 K/uL   nRBC 0.0 0.0 - 0.2 %  Basic metabolic panel     Status: Abnormal   Collection Time: 07/23/18  3:14 AM   Result Value Ref Range   Sodium 141 135 - 145 mmol/L   Potassium 2.8 (L) 3.5 - 5.1 mmol/L   Chloride 112 (H) 98 - 111 mmol/L   CO2 19 (L) 22 - 32 mmol/L   Glucose, Bld 138 (H) 70 - 99 mg/dL   BUN 13 8 - 23 mg/dL   Creatinine, Ser 0.96 0.61 - 1.24 mg/dL   Calcium 8.9 8.9 - 10.3 mg/dL   GFR calc non Af Amer >60 >60 mL/min   GFR calc Af Amer >60 >60 mL/min   Anion gap 10 5 - 15      ImagingResults(Last48hours)  Ct Angio Head W Or Wo Contrast  Result Date: 07/21/2018 CLINICAL DATA:  Followup intracranial hemorrhage. EXAM: CT ANGIOGRAPHY HEAD AND NECK TECHNIQUE: Multidetector CT imaging of the head and neck was performed using the standard protocol during bolus administration of intravenous contrast. Multiplanar CT image reconstructions and MIPs were obtained to evaluate the vascular anatomy. Carotid stenosis measurements (when applicable) are obtained utilizing NASCET criteria, using the distal internal carotid diameter as the denominator. CONTRAST:  133mL ISOVUE-370 IOPAMIDOL (ISOVUE-370) INJECTION 76% COMPARISON:  Head CT same day.  MR angiography 06/03/2015. FINDINGS: CTA NECK FINDINGS Aortic arch: Aortic atherosclerosis. No aneurysm or dissection. Branching pattern of the brachiocephalic vessels is normal without origin stenosis. Right carotid system: Common carotid artery shows some atherosclerotic plaque but is widely patent to the bifurcation region. There is soft and calcified plaque at the carotid bifurcation and ICA bulb but there is no stenosis. Cervical ICA is widely patent beyond that. Left carotid system: Common carotid artery widely patent to the bifurcation. There is soft and calcified plaque at the carotid bifurcation and ICA bulb. There is no stenosis. Cervical ICA widely patent beyond that. Vertebral arteries: Both vertebral artery origins are widely patent. Both vertebral arteries are widely patent through the cervical region to the foramen magnum. Skeleton:  Ordinary cervical spondylosis. Other neck: No mass or lymphadenopathy. Upper chest: Mild apical scarring.  No significant or active lesion. Review of the MIP images confirms the above findings CTA HEAD FINDINGS Anterior circulation: Both internal carotid arteries are patent through the skull base and siphon regions. There is atherosclerotic calcification in the carotid siphon regions but no stenosis. The anterior and middle cerebral vessels are patent without stenosis, aneurysm or vascular malformation. No vascular lesions seen in the region of the left lateral thalamus/posterior limb internal capsule hemorrhage. Posterior circulation: Both vertebral arteries are patent through the foramen magnum. There is atherosclerotic disease in both V4 segments. 25% stenosis on the right. 50% stenosis on the left. Both vertebral arteries reach the basilar. No basilar stenosis. Posterior circulation branch vessels appear normal. Venous sinuses: Patent and normal. Anatomic variants: None significant. Delayed phase: No abnormal enhancement. Review of the MIP images confirms the above findings IMPRESSION: Atherosclerotic disease at both carotid bifurcations and ICA bulb regions. No stenosis. 25% stenosis of the right V4 segment. 50% stenosis of the left V4 segment. No evidence of abnormal vessel in the region of the left lateral thalamus/posterior limb internal capsule hemorrhage. Electronically Signed   By: Nelson Chimes M.D.   On: 07/21/2018 21:42   Ct Angio Neck W Or Wo Contrast  Result Date: 07/21/2018 CLINICAL DATA:  Followup intracranial hemorrhage. EXAM: CT ANGIOGRAPHY HEAD AND NECK TECHNIQUE: Multidetector CT imaging of the head and neck was performed using the standard protocol during bolus administration of intravenous contrast. Multiplanar CT image reconstructions and MIPs were obtained to evaluate the vascular anatomy. Carotid stenosis measurements (when applicable) are obtained utilizing NASCET criteria, using the  distal internal carotid diameter as the denominator. CONTRAST:  158mL ISOVUE-370 IOPAMIDOL (ISOVUE-370) INJECTION 76% COMPARISON:  Head CT same day.  MR angiography 06/03/2015. FINDINGS: CTA NECK FINDINGS Aortic arch: Aortic atherosclerosis. No aneurysm or dissection. Branching pattern of the brachiocephalic vessels is normal without origin stenosis. Right carotid system: Common carotid artery shows some atherosclerotic plaque but is widely patent to the bifurcation region. There is soft and calcified plaque at the carotid bifurcation and ICA bulb but there is no stenosis. Cervical ICA is widely patent beyond that. Left carotid system: Common  carotid artery widely patent to the bifurcation. There is soft and calcified plaque at the carotid bifurcation and ICA bulb. There is no stenosis. Cervical ICA widely patent beyond that. Vertebral arteries: Both vertebral artery origins are widely patent. Both vertebral arteries are widely patent through the cervical region to the foramen magnum. Skeleton: Ordinary cervical spondylosis. Other neck: No mass or lymphadenopathy. Upper chest: Mild apical scarring.  No significant or active lesion. Review of the MIP images confirms the above findings CTA HEAD FINDINGS Anterior circulation: Both internal carotid arteries are patent through the skull base and siphon regions. There is atherosclerotic calcification in the carotid siphon regions but no stenosis. The anterior and middle cerebral vessels are patent without stenosis, aneurysm or vascular malformation. No vascular lesions seen in the region of the left lateral thalamus/posterior limb internal capsule hemorrhage. Posterior circulation: Both vertebral arteries are patent through the foramen magnum. There is atherosclerotic disease in both V4 segments. 25% stenosis on the right. 50% stenosis on the left. Both vertebral arteries reach the basilar. No basilar stenosis. Posterior circulation branch vessels appear normal. Venous  sinuses: Patent and normal. Anatomic variants: None significant. Delayed phase: No abnormal enhancement. Review of the MIP images confirms the above findings IMPRESSION: Atherosclerotic disease at both carotid bifurcations and ICA bulb regions. No stenosis. 25% stenosis of the right V4 segment. 50% stenosis of the left V4 segment. No evidence of abnormal vessel in the region of the left lateral thalamus/posterior limb internal capsule hemorrhage. Electronically Signed   By: Nelson Chimes M.D.   On: 07/21/2018 21:42     Assessment/Plan: Diagnosis: Thalamic intracranial hemorrhage 1. Does the need for close, 24 hr/day medical supervision in concert with the patient's rehab needs make it unreasonable for this patient to be served in a less intensive setting? Potentially 2. Co-Morbidities requiring supervision/potential complications: History of hepatitis C, hypertension 3. Due to bladder management, bowel management, safety, skin/wound care, disease management, medication administration, pain management and patient education, does the patient require 24 hr/day rehab nursing? Potentially 4. Does the patient require coordinated care of a physician, rehab nurse, PT, OT, speech to address physical and functional deficits in the context of the above medical diagnosis(es)? Potentially Addressing deficits in the following areas: balance, endurance, locomotion, strength, transferring, bowel/bladder control, bathing, dressing, feeding, grooming, toileting, cognition, speech, language, swallowing and psychosocial support 5. Can the patient actively participate in an intensive therapy program of at least 3 hrs of therapy per day at least 5 days per week? Potentially 6. The potential for patient to make measurable gains while on inpatient rehab is Currently poor due to poor cooperation 7. Anticipated functional outcomes upon discharge from inpatients are defferred PT, defferred OT, deferredSLP 8. Estimated rehab  length of stay to reach the above functional goals is: NA 9. Does the patient have adequate social supports to accommodate these discharge functional goals? Potentially 10. Anticipated D/C setting: Home 11. Anticipated post D/C treatments: [place] therapy 12. Overall Rehab/Functional Prognosis: fair  RECOMMENDATIONS: This patient's condition is appropriate for continued rehabilitative care in the following setting: Would potentially benefit from comprehensive intensive rehab however not cooperative Patient has agreed to participate in recommended program. No Note that insurance prior authorization may be required for reimbursement for recommended care.  Comment: Agitation related to his stroke may partially explain his behavior however some of his refusal of rehab may be related to premorbid personality   Charlett Blake 07/23/2018         Routing History

## 2018-07-26 NOTE — PMR Pre-admission (Signed)
PMR Admission Coordinator Pre-Admission Assessment  Patient: [name] is an 68 y.o., male MRN: [id number] DOB: 05-29-1951 Height: 5\' 9"  (175.3 cm) Weight: 89.2 kg              Insurance Information HMO: yes    PPO:      PCP:      IPA:      80/20:      OTHER:  PRIMARY: U[employer] Medicare      Policy#: [id number]      Subscriber: Patient CM Name: Lenna Sciara     Phone#: [phone]     Fax#: did not provide direct fax Pre-Cert#: [id number]      Employer: [employer] [id number] provided by Lenna Sciara; Updates due to Dorthula Nettles with clinicals due within 7 days of admission (by 08/01/18). Dorthula Nettles (f): [phone]; (p): [phone] Benefits:  Phone #: NA     Name: [email or web address]  Eff. Date: 07/19/18     Deduct: $0      Out of Pocket Max: $6,700      Life Max: NA CIR: $225/admission *(see below)      SNF: $0/day for days 1-20, $176/day for days 21-100; 100 day limit Outpatient: per necessity     Co-Pay: $0/visist Home Health: 100%, per necessity     Co-Pay:  DME: 80%, per necessity     Co-Pay: 20% Providers:   *Pt has QMB-has Medicaid; All copays listed are covered by Medicaid, so patient is covered at 100% with dual eligibility status.   SECONDARY: Medicaid [place]      Policy#: [id number]     Subscriber: Patient Coverage Code: [id number]     CM Name:       Phone#:      Fax#:  Pre-Cert#:       Employer:  Benefits:  Phone #: [phone]     Name:  Eff. Date: verified eligibility on 07/26/18     Deduct:       Out of Pocket Max:       Life Max:  CIR:       SNF:  Outpatient:      Co-Pay:  Home Health:       Co-Pay:  DME:      Co-Pay:   Medicaid Application Date:       Case Manager:  Disability Application Date:       Case Worker:   Emergency Contact Information Contact Information    Name Relation Home Work Mobile   [place] Daughter   [phone]   Harris,Doris Sister   [phone]   Demaris, Leavell Brother   Ordway, [place] Significant other   [phone]   Jerrel Ivory     [phone]     Current Medical History  Patient Admitting Diagnosis:Thalamic intracranial hemorrhage  History of Present Illness: HPI: [name] is a 68 year old male with history of prior CVA with residual left hemiparesis, hep C, HTN, S OB, polysubstance abuse; who was admitted on 07/20/2018 with worsening of slurred speech, right-sided weakness and right facial droop.  Patient was confused and agitated at admission and blood pressure noted to be elevated- 200/92.   UDS + THC. CT head done showing left thalamic hemorrhage with mild edema and evidence of chronic ischemic microangiopathy.  He was started on Cleviprex and this has been weaned off as blood pressure control improved CTA head/neck revealed atherosclerotic disease at both carotid bifurcation without stenosis and 25% stenosis right V4 and 50% stenosis left V4 segment.  Dr. Leonie Man felt  that bleed hypertensive in nature and ASA discontinued.  He continued to have issues with agitation question due to alcohol withdrawal ---Seroquel scheduled and treated with CIWA protocol.   Patient has been educated on cessation of tobacco as well as THC.   Mentation is improving but patient continues to be limited by moderate cognitive deficits affecting safety awareness, memory as well as reports of right visual field deficits, right hemiplegia with right lean affecting mobility and ADLs therapy. CIR recommended due to functional deficits. Pt is to be admitted to CIR on 07/26/18.   Complete NIHSS TOTAL: 8    Past Medical History  Past Medical History:  Diagnosis Date  . Arthritis   . [place] ulcer, chronic 01/11/2018  . Chronic back pain   . GERD (gastroesophageal reflux disease)    uses Baking Soda  . GIB (gastrointestinal bleeding) 2012  . Glaucoma    right eye  . Headache    occasionally  . Hepatitis C   . Hiatal hernia with gastroesophageal reflux disease and esophagitis 01/05/2018  . History of blood transfusion    no abnormal  reaction noted  . History of gout    doesn't take any meds  . Hyperlipidemia    not on any meds  . Hypertension    takes Amlodipine and Lisinopril daily  . Insomnia    takes Ambien nightly  . Ischemic colitis ([place]) 2012  . Joint pain   . Nocturia   . Numbness    both legs occasionally  . PONV (postoperative nausea and vomiting)   . Prostate cancer ([place])   . Shortness of breath dyspnea    rarely but when notices he can be lying/sitting/exertion.Dr.Hochrein is aware per pt  . Stroke ([place]) 2016   . Urinary frequency   . Urinary urgency     Family History  family history includes Alzheimer's disease in his father; CAD in his brother; Cancer in his mother; Heart disease (age of onset: 16) in his sister; Heart failure (age of onset: 61) in his brother.  Prior Rehab/Hospitalizations:  Has the patient had major surgery during 100 days prior to admission? No  Current Medications   Current Facility-Administered Medications:  .   stroke: mapping our early stages of recovery book, , Does not apply, Once, Sethi, Pramod S, MD .  0.9 %  sodium chloride infusion, , Intravenous, PRN, Garvin Fila, MD, Stopped at 07/24/18 [phone] .  acetaminophen (TYLENOL) tablet 650 mg, 650 mg, Oral, Q4H PRN **OR** acetaminophen (TYLENOL) solution 650 mg, 650 mg, Per Tube, Q4H PRN **OR** acetaminophen (TYLENOL) suppository 650 mg, 650 mg, Rectal, Q4H PRN, Leonie Man, Pramod S, MD .  amLODipine (NORVASC) tablet 10 mg, 10 mg, Oral, Daily, Leonie Man, Pramod S, MD, 10 mg at 07/26/18 0950 .  haloperidol lactate (HALDOL) injection 2 mg, 2 mg, Intramuscular, Q6H PRN, Garvin Fila, MD, 2 mg at 07/23/18 0917 .  hydrALAZINE (APRESOLINE) injection 20 mg, 20 mg, Intravenous, Q8H PRN, Garvin Fila, MD, 20 mg at 07/24/18 0414 .  labetalol (NORMODYNE,TRANDATE) injection 10 mg, 10 mg, Intravenous, Once, Antony Contras S, MD .  labetalol (NORMODYNE,TRANDATE) injection 20 mg, 20 mg, Intravenous, Q2H PRN, Garvin Fila, MD, 20 mg  at 07/24/18 0511 .  lisinopril (PRINIVIL,ZESTRIL) tablet 40 mg, 40 mg, Oral, Daily, Garvin Fila, MD, 40 mg at 07/26/18 0950 .  metoprolol tartrate (LOPRESSOR) injection 5 mg, 5 mg, Intravenous, Q5 min PRN, Garvin Fila, MD, 5 mg at 07/23/18 0900 .  pantoprazole (  PROTONIX) EC tablet 40 mg, 40 mg, Oral, QHS, Garvin Fila, MD, 40 mg at 07/25/18 2052 .  QUEtiapine (SEROQUEL) tablet 12.5 mg, 12.5 mg, Oral, BID, Garvin Fila, MD, 12.5 mg at 07/26/18 0950 .  senna-docusate (Senokot-S) tablet 1 tablet, 1 tablet, Oral, BID, Garvin Fila, MD, 1 tablet at 07/26/18 0951 .  traZODone (DESYREL) tablet 100 mg, 100 mg, Oral, QHS, Biby, Sharon L, NP, 100 mg at 07/25/18 2052  Patients Current Diet:  Diet Order            Diet regular Room service appropriate? Yes; Fluid consistency: Thin  Diet effective now              Precautions / Restrictions Precautions Precautions: Fall Precaution Comments: right hemiplegia, right lean Restrictions Weight Bearing Restrictions: No   Has the patient had 2 or more falls or a fall with injury in the past year?No  Prior Activity Level Community (5-7x/wk): not working PTA; able to drive. Got out of the house approx 2-3x/week   [place] / [place] Devices/Equipment: None Home Equipment: Grab bars - tub/shower, Radio producer - single point  Prior Device Use: Indicate devices/aids used by the patient prior to current illness, exacerbation or injury? single point cane  Prior Functional Level Prior Function Level of Independence: Independent with assistive device(s) Comments: pt reports he normally walks with a cane and cares for himself  Self Care: Did the patient need help bathing, dressing, using the toilet or eating?  Independent  Indoor Mobility: Did the patient need assistance with walking from room to room (with or without device)? Independent  Stairs: Did the patient need assistance with internal or external stairs  (with or without device)? Independent  Functional Cognition: Did the patient need help planning regular tasks such as shopping or remembering to take medications? Needed some help  Current Functional Level Cognition  Arousal/Alertness: Awake/alert Overall Cognitive Status: Impaired/Different from baseline Current Attention Level: Selective Orientation Level: Oriented X4 Following Commands: Follows one step commands with increased time Safety/Judgement: Decreased awareness of safety, Decreased awareness of deficits General Comments: pt states "i want to walk in the hall now" pt demonstrates decr awareness to R LE deficits.  Attention: Sustained Sustained Attention: Appears intact Memory: Impaired Awareness: Impaired Awareness Impairment: Intellectual impairment Problem Solving: Impaired Problem Solving Impairment: Verbal complex, Functional complex Executive Function: Reasoning Behaviors: Restless    Extremity Assessment (includes Sensation/Coordination)  Upper Extremity Assessment: RUE deficits/detail RUE Deficits / Details: pt with decr coordination, gross grasp and fine motor with adl task this session. pt with shoulder flexion WFL, elbow flexion WFL, wrist flexion / extension, grasp 3 out 5  Brunstrom IV RUE Coordination: decreased fine motor, decreased gross motor LUE Deficits / Details: decr shoulder fleixon and reports "its been like that " pt with 30 degrees shoulder flexion.   Lower Extremity Assessment: Defer to PT evaluation RLE Deficits / Details: 1/5 hip flexion, 3/5 knee extension LLE Deficits / Details: grossly 3/5 hip flexion and knee extension. Pt reports equal sensation    ADLs  Overall ADL's : Needs assistance/impaired Eating/Feeding: NPO Grooming: Wash/dry face, Applying deodorant, Sitting, Minimal assistance(Stedy ) Grooming Details (indicate cue type and reason): pt able to use mirror for self correction and posture. pt dropping wash cloth in R hand iniitally  and using more of a wrapping around hand method to maintain grasp. pt with bil UE use to clean face with MOD (A) from therapist due to lateral lean R Upper  Body Bathing: Moderate assistance, Sitting Upper Body Bathing Details (indicate cue type and reason): sink level stedy Lower Body Bathing: Maximal assistance, Sit to/from stand Lower Body Bathing Details (indicate cue type and reason): pt able to wash anterior peri area Upper Body Dressing : Moderate assistance, Sitting Upper Body Dressing Details (indicate cue type and reason): stedy (A) to thread R UE Lower Body Dressing: Total assistance Toilet Transfer: +2 for physical assistance, Maximal assistance General ADL Comments: pt OOB to stedy then to sink level task. pt demonstrates visual awareness to R lateral lean with correction with mirror. pt requires more (A) without visual input    Mobility  Overal bed mobility: Needs Assistance Bed Mobility: Supine to Sit Supine to sit: Min assist, HOB elevated General bed mobility comments: pt exiting on R side of bed with L Side initiation. pt needs (A) to rotate to EOB and elevate trunk.  Presents with posterior lean and required cues for weight shifting forward.      Transfers  Overall transfer level: Needs assistance Equipment used: Rolling walker (2 wheeled) Transfers: Sit to/from Stand Sit to Stand: Mod assist, +2 physical assistance Stand pivot transfers: Max assist, +2 physical assistance General transfer comment: Pt required cues for hand placement to push into standing.      Ambulation / Gait / Stairs / Wheelchair Mobility  Ambulation/Gait Ambulation/Gait assistance: Mod assist, +2 physical assistance Gait Distance (Feet): 25 Feet Assistive device: Rolling walker (2 wheeled) Gait Pattern/deviations: Step-to pattern, Decreased stance time - right General Gait Details: Cues for weight shifting and step length.  Pt required assistance to weight shift on R,   Gait velocity  interpretation: <1.31 ft/sec, indicative of household ambulator    Posture / Balance Dynamic Sitting Balance Sitting balance - Comments: right lateral lean Balance Overall balance assessment: Needs assistance Sitting-balance support: Bilateral upper extremity supported, Feet supported Sitting balance-Leahy Scale: Poor Sitting balance - Comments: right lateral lean Standing balance support: Bilateral upper extremity supported Standing balance-Leahy Scale: Poor    Special needs/care consideration BiPAP/CPAP: no CPM: no Continuous Drip IV: 0.9% sodium chloride infusion  Dialysis: no       Days: no Life Vest: no Oxygen: no Special Bed: no Trach Size: no Wound Vac (area): no      Location: no Skin: abrasion to scrotum   Bowel mgmt: last BM: 07/24/18 Bladder mgmt:continent  Diabetic mgmt: no     Previous Home Environment Living Arrangements: Spouse/significant other Available Help at Discharge: Friend(s), Available 24 hours/day Type of Home: Apartment Home Layout: One level Home Access: Level entry Bathroom Shower/Tub: Multimedia programmer: Handicapped height Home Care Services: No Additional Comments: Pt lives with girlfriend who cares for a 2 and 4yo during the day  Discharge Living Setting Plans for Discharge Living Setting: Patient's home, Lives with (comment), Apartment(lives iwth fiance) Type of Home at Discharge: Apartment Discharge Home Layout: One level Discharge Home Access: Stairs to enter Entrance Stairs-Rails: Right, Left(rail in middle of stairs) Entrance Stairs-Number of Steps: 3 Discharge Bathroom Shower/Tub: Walk-in shower Discharge Bathroom Toilet: Standard Discharge Bathroom Accessibility: Yes How Accessible: Accessible via walker Does the patient have any problems obtaining your medications?: Yes (Describe)(possibly, fiance does not drive; pt drove PTA. )  Social/Family/Support Systems Patient Roles: Other (Comment)(does not work, has fiance.  has daugther ) Contact Information: [name] (fiance): [phone]; [phone]; daughter Byrd Hesselbach): [phone] Anticipated Caregiver: Fiance and daugther  Anticipated Caregiver's Contact Information: see above Ability/Limitations of Caregiver: Min A Caregiver Availability: 24/7 Discharge Plan Discussed with  Primary Caregiver: Yes Is Caregiver In Agreement with Plan?: Yes Does Caregiver/Family have Issues with Lodging/Transportation while Pt is in Rehab?: No   Goals/Additional Needs Patient/Family Goal for Rehab: PT: Min A; OT: Min A; SLP: Supervision Expected length of stay: 15-18 days Cultural Considerations: [employer] Dietary Needs: regular diet, thin liquids Equipment Needs: TBD Pt/Family Agrees to Admission and willing to participate: Yes Program Orientation Provided & Reviewed with Pt/Caregiver Including Roles  & Responsibilities: Yes(pt, daugther, and fiance)  Barriers to Discharge: Home environment access/layout  Barriers to Discharge Comments: 3 steps to enter   Decrease burden of Care through IP rehab admission: NA   Possible need for SNF placement upon discharge: Not anticipated; Spoke with fiance and daughter who are able to provide 24/7 A at home if pt able to achieve recommended goal levels.    Patient Condition: This patient's medical and functional status has changed since the consult dated 07/23/18 in which the Rehabilitation Physician determined and documented that the patient was potentially appropriate for intensive rehabilitative care in an inpatient rehabilitation facility. Issues have been addressed and update has been discussed with Dr. Posey Pronto and patient now appropriate for inpatient rehabilitation. Pt is no longer agitated, much more cooperative, and is tolerating therapies therapies well. Will admit to inpatient rehab today.   Preadmission Screen Completed By:  Jhonnie Garner, 07/26/2018 2:57  PM ______________________________________________________________________   Discussed status with Dr. Posey Pronto on 07/26/2018 at 2:57PM and received telephone approval for admission today.  Admission Coordinator:  Jhonnie Garner, time 2:57PM/Date 07/26/18.

## 2018-07-26 NOTE — Evaluation (Addendum)
Occupational Therapy Assessment and Plan  Patient Details  Name: [name] MRN: [id number] Date of Birth: 1951/01/20  OT Diagnosis: ataxia and muscle weakness (generalized), hemiplegia Rehab Potential: Rehab Potential (ACUTE ONLY): Excellent ELOS: 18-21 days   Today's Date: 07/27/2018 OT Individual Time: 1402-1500 OT Individual Time Calculation (min): 58 min     Problem List:  Patient Active Problem List   Diagnosis Date Noted  . Agitation 07/26/2018  . Hepatitis C 07/26/2018  . Nontraumatic thalamic hemorrhage ([place]) 07/26/2018  . ICH (intracerebral hemorrhage) ([place]) 07/20/2018  . Osteoarthritis of left hip 04/06/2018  . Cameron ulcer, chronic 01/11/2018  . Microcytic anemia   . Symptomatic anemia 01/10/2018  . Syncope 01/10/2018  . Hiatal hernia with gastroesophageal reflux disease and esophagitis 01/05/2018  . Cigarette nicotine dependence without complication 75/44/9201  . Insomnia 01/05/2018  . Acute pain of left shoulder 11/21/2017  . Anemia, iron deficiency   . Gastrointestinal bleeding 10/24/2015  . Absolute anemia   . Tobacco abuse   . Acute ischemic stroke ([place])   . Acute CVA (cerebrovascular accident) ([place]) 05/31/2015  . Stroke ([place]) 05/31/2015  . Dyspnea 04/10/2014  . HTN (hypertension) 04/10/2014  . Inguinal hernia without mention of obstruction or gangrene, unilateral or unspecified, (not specified as recurrent)-right 01/30/2014    Past Medical History:  Past Medical History:  Diagnosis Date  . Arthritis   . Cameron ulcer, chronic 01/11/2018  . Chronic back pain   . GERD (gastroesophageal reflux disease)    uses Baking Soda  . GIB (gastrointestinal bleeding) 2012  . Glaucoma    right eye  . Headache    occasionally  . Hepatitis C   . Hiatal hernia with gastroesophageal reflux disease and esophagitis 01/05/2018  . History of blood transfusion    no abnormal reaction noted  . History of gout    doesn't take any meds  . Hyperlipidemia    not  on any meds  . Hypertension    takes Amlodipine and Lisinopril daily  . Insomnia    takes Ambien nightly  . Ischemic colitis ([place]) 2012  . Joint pain   . Nocturia   . Numbness    both legs occasionally  . PONV (postoperative nausea and vomiting)   . Prostate cancer ([place])   . Shortness of breath dyspnea    rarely but when notices he can be lying/sitting/exertion.Dr.Hochrein is aware per pt  . Stroke ([place]) 2016   . Urinary frequency   . Urinary urgency    Past Surgical History:  Past Surgical History:  Procedure Laterality Date  . APPENDECTOMY    . BIOPSY  01/11/2018   Procedure: BIOPSY;  Surgeon: Gatha Mayer, MD;  Location: [facility] ENDOSCOPY;  Service: Endoscopy;;  . COLONOSCOPY    . COLONOSCOPY N/A 10/26/2015   Procedure: COLONOSCOPY;  Surgeon: Doran Stabler, MD;  Location: [facility] ENDOSCOPY;  Service: Endoscopy;  Laterality: N/A;  . ESOPHAGOGASTRODUODENOSCOPY    . ESOPHAGOGASTRODUODENOSCOPY N/A 10/26/2015   Procedure: ESOPHAGOGASTRODUODENOSCOPY (EGD);  Surgeon: Doran Stabler, MD;  Location: [facility] ENDOSCOPY;  Service: Endoscopy;  Laterality: N/A;  . ESOPHAGOGASTRODUODENOSCOPY (EGD) WITH PROPOFOL N/A 01/11/2018   Procedure: ESOPHAGOGASTRODUODENOSCOPY (EGD) WITH PROPOFOL;  Surgeon: Gatha Mayer, MD;  Location: [facility] ENDOSCOPY;  Service: Endoscopy;  Laterality: N/A;  . INGUINAL HERNIA REPAIR Right 11/04/2014   Procedure: RIGHT INGUINAL HERNIA REPAIR WITH MESH;  Surgeon: Jackolyn Confer, MD;  Location: [place];  Service: General;  Laterality: Right;  . MASS EXCISION N/A 11/04/2014  Procedure: REMOVAL OF RIGHT GROIN SOFT TISSUE MASS;  Surgeon: Jackolyn Confer, MD;  Location: [place];  Service: General;  Laterality: N/A;  . Multiple abdominal surgeries     total of 13  . PROSTATECTOMY    . SMALL INTESTINE SURGERY      Assessment & Plan Clinical Impression: Patient is a 68 y.o. year old male with history of prior CVA with residual left hemiparesis, hep C, HTN, S OB, polysubstance abuse; who  was admitted on 07/20/2018 with worsening of slurred speech, right-sided weakness and right facial droop.  Patient was confused and agitated at admission and blood pressure noted to be elevated- 200/92.   UDS + THC. CT head done showing left thalamic hemorrhage with mild edema and evidence of chronic ischemic microangiopathy.  Patient transferred to CIR on 07/26/2018 .    Patient currently requires max with basic self-care skills secondary to muscle weakness, impaired timing and sequencing, ataxia, decreased coordination and decreased motor planning, decreased memory and delayed processing and decreased sitting balance, decreased standing balance, decreased postural control, hemiplegia and decreased balance strategies.  Prior to hospitalization, patient could complete BADL with independent .  Patient will benefit from skilled intervention to increase independence with basic self-care skills prior to discharge home with care partner.  Anticipate patient will require 24 hour supervision and follow up home health. OT - End of Session Endurance Deficit: Yes Endurance Deficit Description: rest breaks within BADL tasks OT Assessment Rehab Potential (ACUTE ONLY): Excellent OT Patient demonstrates impairments in the following area(s): Balance;Cognition;Endurance;Motor;Perception;Safety OT Basic ADL's Functional Problem(s): Eating;Grooming;Bathing;Dressing;Toileting OT Transfers Functional Problem(s): Toilet;Tub/Shower OT Additional Impairment(s): Fuctional Use of Upper Extremity OT Plan OT Intensity: Minimum of 1-2 x/day, 45 to 90 minutes OT Frequency: 5 out of 7 days OT Duration/Estimated Length of Stay: 18-21 days OT Treatment/Interventions: Balance/vestibular training;Cognitive remediation/compensation;Community reintegration;Discharge planning;DME/adaptive equipment instruction;Functional mobility training;Neuromuscular re-education;Patient/family education;Self Care/advanced ADL retraining;Therapeutic  Activities;Therapeutic Exercise;UE/LE Strength taining/ROM;UE/LE Coordination activities;Wheelchair propulsion/positioning OT Self Feeding Anticipated Outcome(s): Mod I OT Basic Self-Care Anticipated Outcome(s): Supervision OT Toileting Anticipated Outcome(s): Supervision OT Bathroom Transfers Anticipated Outcome(s): Supervision OT Recommendation Patient destination: Home Follow Up Recommendations: Home health OT Equipment Recommended: To be determined  Skilled Therapeutic Intervention Initial eval completed with treatment provided to address functional transfers, functional use of R UE, improved sit<>stand, standing tolerance, and adapted bathing/dressing skills. Pt came to sitting EOB with mod A. Sit<>stand in stedy with bed raised and mod A. Incorporated L NMR weight bearing techniques within bathing tasks with guided A to coordinate use of R hand. Attempted sit<>stand at the sink without stedy with pt unable to achieve stand with max A and facilitation for anterior weight shift. Pt with difficulty motor planning stand without stedy in front of pt . Min A UB dressing and Max A for LB dressing to thread B LEs into pant legs. Mod A sit<>stand in [place], then pt able to remove unilateral UE to assist with pulling up pants. Pt with R UE limited more by coordination, and L UE limited by strength deficits. Pt unable to flex L shoulder past 20 degrees. Pt requests to return to bed at end of session so Stedy used to transfer pt back to bed with min A to stand in [place] by pulling up on bars. Pt left supine in bed with bed alarm on and needs met.    OT Evaluation Precautions/Restrictions  Precautions Precautions: Fall Precaution Comments: right hemiplegia, right lean Pain Pain Assessment Pain Scale: 0-10 Pain Score: 4  Pain Type:  Chronic pain Pain Location: Shoulder Pain Orientation: Left Pain Onset: On-going Pain Intervention(s): Repositioned Home Living/Prior Functioning Home  Living Family/patient expects to be discharged to:: Private residence Living Arrangements: Spouse/significant other Available Help at Discharge: Friend(s), Available 24 hours/day Type of Home: Apartment Home Access: Level entry Home Layout: One level Bathroom Shower/Tub: Multimedia programmer: Handicapped height Additional Comments: Handicapped apartment- built in [employer], contracting  Lives With: Significant other IADL History Education: 11th grade IADL Comments: Enjoys fishing and watching movies Prior Function Level of Independence: Independent with basic ADLs, Independent with gait, Requires assistive device for independence  Able to Take Stairs?: Yes Driving: Yes Vocation: On disability Leisure: Hobbies-yes (Comment) Comments: used to love football ADL ADL Eating: Set up Grooming: Setup Upper Body Bathing: Minimal assistance Lower Body Bathing: Moderate assistance Upper Body Dressing: Minimal assistance Lower Body Dressing: Maximal assistance Toilet Transfer: Maximal assistance Vision Baseline Vision/History: Wears glasses Wears Glasses: Reading only Patient Visual Report: No change from baseline Vision Assessment?: No apparent visual deficits Perception  Perception: Impaired Inattention/Neglect: Does not attend to right visual field(decreased awareness of R) Praxis Praxis: Not tested Cognition Overall Cognitive Status: Impaired/Different from baseline Arousal/Alertness: Awake/alert Orientation Level: Person;Place;Situation Person: Oriented Place: Oriented Situation: Oriented Year: 2020 Month: January Day of Week: Correct Memory: Impaired Memory Impairment: Decreased recall of new information;Decreased short term memory;Retrieval deficit Decreased Short Term Memory: Verbal complex;Functional complex Immediate Memory Recall: Sock;Blue;Bed Memory Recall: (unable to recall any words) Attention: Selective Selective Attention: Impaired Selective Attention  Impairment: Verbal complex;Functional complex Awareness: Impaired Awareness Impairment: Emergent impairment;Anticipatory impairment Problem Solving: Impaired Problem Solving Impairment: Verbal complex;Functional complex Executive Function: Reasoning Reasoning: Impaired Reasoning Impairment: Verbal complex;Functional complex Safety/Judgment: Impaired Comments: decreased awareness of right, decreased emergent/anticipatory awareness Sensation Sensation Light Touch: Appears Intact Hot/Cold: Appears Intact Coordination Gross Motor Movements are Fluid and Coordinated: No Fine Motor Movements are Fluid and Coordinated: No Coordination and Movement Description: Decreased smoothness and accuracy of R UE Finger Nose Finger Test: Dysmetria Motor  Motor Motor: Hemiplegia Mobility  Transfers Sit to Stand: Maximal Assistance - Patient 25-49% Stand to Sit: Moderate Assistance - Patient 50-74%  Balance Dynamic Sitting Balance Dynamic Sitting - Level of Assistance: 4: Min assist Static Standing Balance Static Standing - Balance Support: During functional activity Static Standing - Level of Assistance: 3: Mod assist Dynamic Standing Balance Dynamic Standing - Balance Support: During functional activity Dynamic Standing - Level of Assistance: 3: Mod assist Extremity/Trunk Assessment RUE Assessment RUE Assessment: Exceptions to [facility] Passive Range of Motion (PROM) Comments: WFL Active Range of Motion (AROM) Comments: limited shoulder flexion due to strength deficits RUE Body System: Neuro Brunstrum levels for arm and hand: Arm;Hand Brunstrum level for arm: Stage V Relative Independence from Synergy Brunstrum level for hand: Stage V Independence from basic synergies RUE AROM (degrees) Overall AROM Right Upper Extremity: Deficits RUE Overall AROM Comments: limited shoulder elevation due to weakness RUE Strength RUE Overall Strength: Deficits RUE Overall Strength Comments: Shoulder elevation  3-/5, elbow flexion 3+/5, extension 4-/5, grip 4-/5 LUE Assessment LUE Assessment: Exceptions to [facility] Passive Range of Motion (PROM) Comments: WFL Active Range of Motion (AROM) Comments: Limited shoulder elevation due to pain/weakness in shoulder- shoulder flexion only about 20 degrees General Strength Comments: Shoulder elevation 2+/5, elbow flexion 4/5, extension 4/5     Refer to Care Plan for Long Term Goals  Recommendations for other services: None    Discharge Criteria: Patient will be discharged from OT if patient refuses treatment 3 consecutive times without  medical reason, if treatment goals not met, if there is a change in medical status, if patient makes no progress towards goals or if patient is discharged from hospital.  The above assessment, treatment plan, treatment alternatives and goals were discussed and mutually agreed upon: by patient and by family  Valma Cava 07/27/2018, 4:19 PM

## 2018-07-26 NOTE — Discharge Summary (Addendum)
Stroke Discharge Summary  Patient ID: Olivier Frayre   MRN: [id number]      DOB: Dec 05, 1950  Date of Admission: 07/20/2018 Date of Discharge: 07/26/2018  Attending Physician:  Garvin Fila, MD, Stroke MD Consultant(s):   Alysia Penna, MD (Physical Medicine & Rehabtilitation)  Patient's PCP:  Billie Ruddy, MD  Discharge Diagnoses:  Principal Problem:   ICH (intracerebral hemorrhage) ([place]) Active Problems:   HTN (hypertension)   Tobacco abuse   Anemia, iron deficiency   Insomnia   Microcytic anemia   Agitation   Hepatitis C  Past Medical History:  Diagnosis Date  . Arthritis   . [place] ulcer, chronic 01/11/2018  . Chronic back pain   . GERD (gastroesophageal reflux disease)    uses Baking Soda  . GIB (gastrointestinal bleeding) 2012  . Glaucoma    right eye  . Headache    occasionally  . Hepatitis C   . Hiatal hernia with gastroesophageal reflux disease and esophagitis 01/05/2018  . History of blood transfusion    no abnormal reaction noted  . History of gout    doesn't take any meds  . Hyperlipidemia    not on any meds  . Hypertension    takes Amlodipine and Lisinopril daily  . Insomnia    takes Ambien nightly  . Ischemic colitis ([place]) 2012  . Joint pain   . Nocturia   . Numbness    both legs occasionally  . PONV (postoperative nausea and vomiting)   . Prostate cancer ([place])   . Shortness of breath dyspnea    rarely but when notices he can be lying/sitting/exertion.Dr.Hochrein is aware per pt  . Stroke ([place]) 2016   . Urinary frequency   . Urinary urgency    Past Surgical History:  Procedure Laterality Date  . APPENDECTOMY    . BIOPSY  01/11/2018   Procedure: BIOPSY;  Surgeon: Gatha Mayer, MD;  Location: [facility] ENDOSCOPY;  Service: Endoscopy;;  . COLONOSCOPY    . COLONOSCOPY N/A 10/26/2015   Procedure: COLONOSCOPY;  Surgeon: Doran Stabler, MD;  [facility] ENDOSCOPY;  Service: Endoscopy;  Laterality: N/A;  . ESOPHAGOGASTRODUODENOSCOPY     . ESOPHAGOGASTRODUODENOSCOPY N/A 10/26/2015   Procedure: ESOPHAGOGASTRODUODENOSCOPY (EGD);  Surgeon: Doran Stabler, MD;  Location: [facility] ENDOSCOPY;  Service: Endoscopy;  Laterality: N/A;  . ESOPHAGOGASTRODUODENOSCOPY (EGD) WITH PROPOFOL N/A 01/11/2018   Procedure: ESOPHAGOGASTRODUODENOSCOPY (EGD) WITH PROPOFOL;  Surgeon: Gatha Mayer, MD;  Location: [facility] ENDOSCOPY;  Service: Endoscopy;  Laterality: N/A;  . INGUINAL HERNIA REPAIR Right 11/04/2014   Procedure: RIGHT INGUINAL HERNIA REPAIR WITH MESH;  Surgeon: Jackolyn Confer, MD;  Location: [place];  Service: General;  Laterality: Right;  . MASS EXCISION N/A 11/04/2014   Procedure: REMOVAL OF RIGHT GROIN SOFT TISSUE MASS;  Surgeon: Jackolyn Confer, MD;  Location: [place];  Service: General;  Laterality: N/A;  . Multiple abdominal surgeries     total of 13  . PROSTATECTOMY    . SMALL INTESTINE SURGERY      Medications to be continued on Rehab Allergies as of 07/26/2018      Reactions   Penicillins Anaphylaxis   Has patient had a PCN reaction causing immediate rash, facial/tongue/throat swelling, SOB or lightheadedness with hypotension: Yes Has patient had a PCN reaction causing severe rash involving mucus membranes or skin necrosis: No Has patient had a PCN reaction that required hospitalization: No Has patient had a PCN reaction occurring within the last 10  years: No If all of the above answers are "NO", then may proceed with Cephalosporin use..   Sudafed [pseudoephedrine] Other (See Comments)   Heart "races"      Medication List    STOP taking these medications   bisacodyl 5 MG EC tablet Commonly known as:  bisacodyl   cloNIDine 0.1 MG tablet Commonly known as:  CATAPRES   Diclofenac Sodium 3 % Gel   hydrochlorothiazide 12.5 MG capsule Commonly known as:  MICROZIDE   polyethylene glycol packet Commonly known as:  MIRALAX / GLYCOLAX   sucralfate 1 GM/10ML suspension Commonly known as:  CARAFATE   zolpidem 10 MG tablet Commonly  known as:  AMBIEN     TAKE these medications   amLODipine 10 MG tablet Commonly known as:  NORVASC Take 1 tablet (10 mg total) by mouth daily.   DULoxetine 30 MG capsule Commonly known as:  CYMBALTA Take 1 capsule (30 mg total) by mouth 2 (two) times daily.   ferrous sulfate 325 (65 FE) MG tablet TAKE 1 TABLET BY MOUTH THREE TWICE A DAY WITH MEALS What changed:    how much to take  how to take this  when to take this  additional instructions   HYDROcodone-acetaminophen 5-325 MG tablet Commonly known as:  NORCO/VICODIN Take 1 tablet by mouth every 6 (six) hours as needed for moderate pain.   lisinopril 40 MG tablet Commonly known as:  PRINIVIL,ZESTRIL Take 40 mg by mouth daily.   pantoprazole 40 MG tablet Commonly known as:  PROTONIX TAKE 1 TABLET BY MOUTH TWICE A DAY BEFORE A MEAL What changed:  See the new instructions.   QUEtiapine 25 MG tablet Commonly known as:  SEROQUEL Take 0.5 tablets (12.5 mg total) by mouth 2 (two) times daily.   senna-docusate 8.6-50 MG tablet Commonly known as:  Senokot-S Take 1 tablet by mouth 2 (two) times daily.   traZODone 100 MG tablet Commonly known as:  DESYREL Take 1 tablet (100 mg total) by mouth at bedtime. What changed:    medication strength  how much to take       LABORATORY STUDIES CBC    Component Value Date/Time   WBC 5.6 07/26/2018 0507   RBC 3.49 (L) 07/26/2018 0507   HGB 7.7 (L) 07/26/2018 0507   HCT 27.8 (L) 07/26/2018 0507   HCT 32.1 (L) 01/11/2018 0747   PLT 295 07/26/2018 0507   MCV 79.7 (L) 07/26/2018 0507   MCH 22.1 (L) 07/26/2018 0507   MCHC 27.7 (L) 07/26/2018 0507   RDW 18.4 (H) 07/26/2018 0507   LYMPHSABS 1.4 07/20/2018 1624   MONOABS 0.5 07/20/2018 1624   EOSABS 0.1 07/20/2018 1624   BASOSABS 0.1 07/20/2018 1624   CMP    Component Value Date/Time   NA 140 07/26/2018 0507   NA 143 06/23/2017 1413   K 4.1 07/26/2018 0507   CL 112 (H) 07/26/2018 0507   CO2 19 (L) 07/26/2018 0507    GLUCOSE 114 (H) 07/26/2018 0507   BUN 20 07/26/2018 0507   BUN 14 06/23/2017 1413   CREATININE 0.96 07/26/2018 0507   CALCIUM 8.4 (L) 07/26/2018 0507   PROT 7.1 07/20/2018 1624   ALBUMIN 4.0 07/20/2018 1624   AST 24 07/20/2018 1624   ALT 17 07/20/2018 1624   ALT 16 05/12/2016 1550   ALKPHOS 94 07/20/2018 1624   BILITOT <0.1 (L) 07/20/2018 1624   GFRNONAA >60 07/26/2018 0507   GFRAA >60 07/26/2018 0507   COAGS Lab Results  Component Value  Date   INR 1.23 07/20/2018   INR 1.2 (H) 05/12/2016   INR 1.37 10/26/2015   Lipid Panel    Component Value Date/Time   CHOL 102 07/22/2018 0408   TRIG 108 07/22/2018 0408   HDL 35 (L) 07/22/2018 0408   CHOLHDL 2.9 07/22/2018 0408   VLDL 22 07/22/2018 0408   LDLCALC 45 07/22/2018 0408   HgbA1C  Lab Results  Component Value Date   HGBA1C 4.7 (L) 07/22/2018   Urinalysis    Component Value Date/Time   COLORURINE YELLOW 09/10/2016 1730   APPEARANCEUR HAZY (A) 09/10/2016 1730   LABSPEC 1.023 09/10/2016 1730   PHURINE 5.0 09/10/2016 1730   GLUCOSEU NEGATIVE 09/10/2016 1730   HGBUR NEGATIVE 09/10/2016 1730   BILIRUBINUR NEGATIVE 09/10/2016 1730   BILIRUBINUR n 02/13/2016 1044   KETONESUR NEGATIVE 09/10/2016 1730   PROTEINUR 30 (A) 09/10/2016 1730   UROBILINOGEN 1.0 02/13/2016 1044   UROBILINOGEN 1.0 01/27/2012 0005   NITRITE NEGATIVE 09/10/2016 1730   LEUKOCYTESUR NEGATIVE 09/10/2016 1730   Urine Drug Screen     Component Value Date/Time   LABOPIA NONE DETECTED 07/20/2018 1638   COCAINSCRNUR NONE DETECTED 07/20/2018 1638   LABBENZ NONE DETECTED 07/20/2018 1638   AMPHETMU NONE DETECTED 07/20/2018 1638   THCU POSITIVE (A) 07/20/2018 1638   LABBARB NONE DETECTED 07/20/2018 1638    Alcohol Level    Component Value Date/Time   ETH <10 07/20/2018 1746     SIGNIFICANT DIAGNOSTIC STUDIES Ct Angio Head W Or Wo Contrast  Result Date: 07/21/2018 CLINICAL DATA:  Followup intracranial hemorrhage. EXAM: CT ANGIOGRAPHY HEAD AND  NECK TECHNIQUE: Multidetector CT imaging of the head and neck was performed using the standard protocol during bolus administration of intravenous contrast. Multiplanar CT image reconstructions and MIPs were obtained to evaluate the vascular anatomy. Carotid stenosis measurements (when applicable) are obtained utilizing NASCET criteria, using the distal internal carotid diameter as the denominator. CONTRAST:  144mL ISOVUE-370 IOPAMIDOL (ISOVUE-370) INJECTION 76% COMPARISON:  Head CT same day.  MR angiography 06/03/2015. FINDINGS: CTA NECK FINDINGS Aortic arch: Aortic atherosclerosis. No aneurysm or dissection. Branching pattern of the brachiocephalic vessels is normal without origin stenosis. Right carotid system: Common carotid artery shows some atherosclerotic plaque but is widely patent to the bifurcation region. There is soft and calcified plaque at the carotid bifurcation and ICA bulb but there is no stenosis. Cervical ICA is widely patent beyond that. Left carotid system: Common carotid artery widely patent to the bifurcation. There is soft and calcified plaque at the carotid bifurcation and ICA bulb. There is no stenosis. Cervical ICA widely patent beyond that. Vertebral arteries: Both vertebral artery origins are widely patent. Both vertebral arteries are widely patent through the cervical region to the foramen magnum. Skeleton: Ordinary cervical spondylosis. Other neck: No mass or lymphadenopathy. Upper chest: Mild apical scarring.  No significant or active lesion. Review of the MIP images confirms the above findings CTA HEAD FINDINGS Anterior circulation: Both internal carotid arteries are patent through the skull base and siphon regions. There is atherosclerotic calcification in the carotid siphon regions but no stenosis. The anterior and middle cerebral vessels are patent without stenosis, aneurysm or vascular malformation. No vascular lesions seen in the region of the left lateral thalamus/posterior  limb internal capsule hemorrhage. Posterior circulation: Both vertebral arteries are patent through the foramen magnum. There is atherosclerotic disease in both V4 segments. 25% stenosis on the right. 50% stenosis on the left. Both vertebral arteries reach the basilar. No basilar stenosis.  Posterior circulation branch vessels appear normal. Venous sinuses: Patent and normal. Anatomic variants: None significant. Delayed phase: No abnormal enhancement. Review of the MIP images confirms the above findings IMPRESSION: Atherosclerotic disease at both carotid bifurcations and ICA bulb regions. No stenosis. 25% stenosis of the right V4 segment. 50% stenosis of the left V4 segment. No evidence of abnormal vessel in the region of the left lateral thalamus/posterior limb internal capsule hemorrhage. Electronically Signed   By: Nelson Chimes M.D.   On: 07/21/2018 21:42   Ct Head Wo Contrast  Result Date: 07/21/2018 CLINICAL DATA:  Intracranial hemorrhage follow up EXAM: CT HEAD WITHOUT CONTRAST TECHNIQUE: Contiguous axial images were obtained from the base of the skull through the vertex without intravenous contrast. COMPARISON:  07/20/2018 FINDINGS: Brain: Unchanged hemorrhage within the left thalamus. Mild surrounding edema. The size and configuration of the ventricles and extra-axial CSF spaces are normal. There is hypoattenuation of the periventricular white matter, most commonly indicating chronic ischemic microangiopathy. Vascular: No abnormal hyperdensity of the major intracranial arteries or dural venous sinuses. No intracranial atherosclerosis. Skull: The visualized skull base, calvarium and extracranial soft tissues are normal. Sinuses/Orbits: No fluid levels or advanced mucosal thickening of the visualized paranasal sinuses. No mastoid or middle ear effusion. The orbits are normal. IMPRESSION: Unchanged hemorrhage within the left thalamus with mild surrounding edema. Electronically Signed   By: Ulyses Jarred M.D.    On: 07/21/2018 00:45   Ct Angio Neck W Or Wo Contrast  Result Date: 07/21/2018 CLINICAL DATA:  Followup intracranial hemorrhage. EXAM: CT ANGIOGRAPHY HEAD AND NECK TECHNIQUE: Multidetector CT imaging of the head and neck was performed using the standard protocol during bolus administration of intravenous contrast. Multiplanar CT image reconstructions and MIPs were obtained to evaluate the vascular anatomy. Carotid stenosis measurements (when applicable) are obtained utilizing NASCET criteria, using the distal internal carotid diameter as the denominator. CONTRAST:  162mL ISOVUE-370 IOPAMIDOL (ISOVUE-370) INJECTION 76% COMPARISON:  Head CT same day.  MR angiography 06/03/2015. FINDINGS: CTA NECK FINDINGS Aortic arch: Aortic atherosclerosis. No aneurysm or dissection. Branching pattern of the brachiocephalic vessels is normal without origin stenosis. Right carotid system: Common carotid artery shows some atherosclerotic plaque but is widely patent to the bifurcation region. There is soft and calcified plaque at the carotid bifurcation and ICA bulb but there is no stenosis. Cervical ICA is widely patent beyond that. Left carotid system: Common carotid artery widely patent to the bifurcation. There is soft and calcified plaque at the carotid bifurcation and ICA bulb. There is no stenosis. Cervical ICA widely patent beyond that. Vertebral arteries: Both vertebral artery origins are widely patent. Both vertebral arteries are widely patent through the cervical region to the foramen magnum. Skeleton: Ordinary cervical spondylosis. Other neck: No mass or lymphadenopathy. Upper chest: Mild apical scarring.  No significant or active lesion. Review of the MIP images confirms the above findings CTA HEAD FINDINGS Anterior circulation: Both internal carotid arteries are patent through the skull base and siphon regions. There is atherosclerotic calcification in the carotid siphon regions but no stenosis. The anterior and middle  cerebral vessels are patent without stenosis, aneurysm or vascular malformation. No vascular lesions seen in the region of the left lateral thalamus/posterior limb internal capsule hemorrhage. Posterior circulation: Both vertebral arteries are patent through the foramen magnum. There is atherosclerotic disease in both V4 segments. 25% stenosis on the right. 50% stenosis on the left. Both vertebral arteries reach the basilar. No basilar stenosis. Posterior circulation branch vessels appear normal. Venous  sinuses: Patent and normal. Anatomic variants: None significant. Delayed phase: No abnormal enhancement. Review of the MIP images confirms the above findings IMPRESSION: Atherosclerotic disease at both carotid bifurcations and ICA bulb regions. No stenosis. 25% stenosis of the right V4 segment. 50% stenosis of the left V4 segment. No evidence of abnormal vessel in the region of the left lateral thalamus/posterior limb internal capsule hemorrhage. Electronically Signed   By: Nelson Chimes M.D.   On: 07/21/2018 21:42   Ct Head Code Stroke Wo Contrast  Result Date: 07/20/2018 CLINICAL DATA:  Code stroke. Combative with confusion. Hypertensive patient with RIGHT arm weakness. EXAM: CT HEAD WITHOUT CONTRAST TECHNIQUE: Contiguous axial images were obtained from the base of the skull through the vertex without intravenous contrast. COMPARISON:  01/10/2018. FINDINGS: Motion degraded scan.  Overall study diagnostic. Brain: There is a 16 x 15 x 22 mm acute hemorrhage, approximate volume 3 mL, involving the LEFT lateral superior thalamus extending into the periventricular/posterior lentiform nucleus region. Mild surrounding edema. No midline shift. No intraventricular or subarachnoid extension. Premature for age atrophy. Chronic microvascular ischemic change of the periventricular and subcortical white matter. Vascular: Calcification of the cavernous internal carotid arteries consistent with cerebrovascular atherosclerotic  disease. No signs of intracranial large vessel occlusion. Skull: Calvarium intact. Sinuses/Orbits: No significant sinus disease. Negative orbits. Other: None. Compared with 01/10/2018, the degree of atrophy is similar. The bleed is acute. ASPECTS [facility] Stroke Program Early CT Score) Not applicable due to bleed. IMPRESSION: 1. 16 x 15 x 22 mm acute hemorrhage involving the LEFT lateral superior thalamus extending into the periventricular/posterior lentiform nucleus region. Mild surrounding edema. No midline shift. Hypertensive etiology is likely. 2. ASPECTS is not applicable due to bleed. 3. Critical Value/emergent results were called by telephone at the time of interpretation on 07/20/2018 at 4:28 pm to Dr. Amie Portland , who verbally acknowledged these results. * Electronically Signed   By: Staci Righter M.D.   On: 07/20/2018 16:33    2D Echocardiogram  - Left ventricle: The cavity size was normal. Wall thickness was increased in a pattern of mild LVH. Systolic function was vigorous. The estimated ejection fraction was in the range of 65% to 70%. Wall motion was normal; there were no regional wall motion abnormalities. Doppler parameters are consistent with abnormal left ventricular relaxation (grade 1 diastolic dysfunction). - Aortic valve: Trileaflet; moderately thickened, moderately calcified leaflets. Valve area (Vmax): 4.21 cm^2. - Left atrium: The atrium was mildly dilated. Impressions:  No cardiac source of emboli was indentified.     HISTORY OF PRESENT ILLNESS Garvey Westcott is a 68 y.o. male past medical history of a prior stroke with residual left-sided weakness, hepatitis C, hypertension that is uncontrolled, shortness of breath, tobacco abuse, substance abuse in the past, presented to the emergency room and EMS was called because of his worsening slurred speech and right-sided facial droop. The patient was very agitated and did not provide reliable history.  He kept on saying that his  right-sided weakness is baseline whereas chart review revealed that he only had left-sided weakness from his prior pontine stroke. His blood pressures by EMS were in the 200s. His blood pressure repeat was 937 systolic. Noncontrast CT of the head showed a bleed in the left lateral superior thalamus extending into the periventricular/posterior lentiform nucleus region with mild surrounding edema and no midline shift. Patient will be admitted to the neuro ICU for further monitoring and management of his severe hypertension/hypertensive emergency. He was LKW 07/20/2018 at  1515. Premorbid modified Rankin scale (mRS): Patient unable to clearly provide but to the best of her history taking-2.    HOSPITAL COURSE Mr. Schon Zeiders is a 68 y.o. male with history of right pontine infarct in 05/2015, smoker, hypertension, hyperlipidemia, hep C admitted for worsening slurred speech and right-sided facial droop. CT showed a L BG/CR ICH found to be d/t to uncontrolled HTN.  ICH:  left BG/CR ICH, secondary to uncontrolled hypertension and smoking  Resultant right-sided weakness more than left sided weakness  CT head left BG/CR ICH  Repeat CT head showed stable BG/CR ICH on the left   CT head and neck Atherosclerotic disease at both carotid bifurcations and ICA bulb regions. No stenosis.25% stenosis of the right V4 segment. 50% stenosis of the left V4 segment. No evidence of abnormal vessel in the region of the left lateral thalamus/posterior limb internal capsule hemorrhage.  2D Echo EF 65 to 70%  LDL 45 less than goal 70  HgbA1c 4.7  UDS positive for THC  aspirin 325 mg daily prior to admission, now on No antithrombotic due to [place]  Therapy recommendations: CIR   Disposition: CIR  History of stroke  05/2015, admitted for left-sided weakness, MRI showed right pontine infarct.  TCD diffuse atherosclerosis.  Carotid Doppler negative.  EF 60 to 65%.  LDL 40 and A1c 5.8.  He was discharged with  aspirin 325.  Has residual mild left-sided weakness.  Hypertensive Urgency  Treated with cleviprex on ICU  Home medications: Norvasc 10 mg, lisinopril 40 mg. catapress 0.3 bid, HCTZ 12.5 bid - have not picked up recent refills. Doubt he was taking  No on:  norvasc 10, lisinopril 40  BP goal < 180   BP now 120-160s  BP goal normotensive  Tobacco abuse  Current smoker  Smoking cessation counseling provided  Pt is willing to quit- may benefit  from patches or assistive aids   Agitation Insomnia  ?? Alcohol withdraw  Intermittent agitation, insisting going home  Denies heavy alcohol use, but stated that seldomly drink beer for celebration  Treated w/ Precedex on ICU, now off  On seroquel   On trazadone q hs - increased to 100 q hs (takes [place] w/ trazadone at home). Dr. Leonie Man does not want to add ambien at this time.  Other Stroke Risk Factors  Advanced age  Obesity, Body mass index is 29.04 kg/m.   THC use  Other Active Problems  Hepatitis C  Hypokalemia, resolved 4.1  Leukocytosis, resolved WBC 11.3 -> 5.6  Anemia Hgb 8.6->7.7, was 10.9 on admission. Rehab to f/u labs in am    DISCHARGE EXAM Blood pressure 118/83, pulse 77, temperature 98.9 F (37.2 C), temperature source Oral, resp. rate 18, height 5\' 9"  (1.753 m), weight 89.2 kg, SpO2 99 %. General - Well nourished, well developed,  Elderly african american male not in distress  Cardiovascular - Regular rate and rhythm.  Mental Status -  Level of arousal and orientation to time, place, and person were intact. Language including expression, naming, repetition, comprehension was assessed and found intact. However, moderate to severe dysarthria  Cranial Nerves II - XII - II - Visual field intact OU. III, IV, VI - Extraocular movements intact. V - Facial sensation intact bilaterally. VII - right facial droop. VIII - Hearing & vestibular intact bilaterally. X - Palate elevates  symmetrically, moderate to severe dysarthria. XI - Chin turning & shoulder shrug intact bilaterally. XII - Tongue protrusion intact.  Motor Strength -  The patient's strength showed LUE 3/5 and LLE 4/5. RUE 2+/5 and LLE 3-/5.  Bulk was normal and fasciculations were absent.   Motor Tone - Muscle tone was assessed at the neck and appendages and was normal.  Reflexes - The patient's reflexes were symmetrical in all extremities and he had no pathological reflexes.  Sensory - Light touch, temperature/pinprick were assessed and were symmetrical.    Coordination - not cooperative on exam.  Tremor was absent.  Gait and Station - deferred.  Discharge Diet  Regular diet thin liquids  DISCHARGE PLAN  Disposition:  Transfer to [place] for ongoing PT, OT and ST  Due to hemorrhage and risk of bleeding, do not take aspirin, aspirin-containing medications, or ibuprofen products   Recommend ongoing risk factor control by Primary Care Physician at time of discharge from inpatient rehabilitation.  Follow-up Billie Ruddy, MD in 2 weeks following discharge from rehab.  Follow-up in [facility] in 4 weeks following discharge from rehab, office to schedule an appointment.   30 minutes were spent preparing discharge.  Burnetta Sabin, MSN, APRN, ANVP-BC, AGPCNP-BC Advanced Practice Stroke Nurse [place] for Schedule & Pager information 07/26/2018 2:51 PM  I have personally obtained history,examined this patient, reviewed notes, independently viewed imaging studies, participated in medical decision making and plan of care.ROS completed by me personally and pertinent positives fully documented  I have made any additions or clarifications directly to the above note. Agree with note above.    Antony Contras, MD Medical Director [facility] Pager: [phone] 07/26/2018 4:20 PM

## 2018-07-27 ENCOUNTER — Inpatient Hospital Stay (HOSPITAL_COMMUNITY): Payer: Medicare Other

## 2018-07-27 ENCOUNTER — Inpatient Hospital Stay (HOSPITAL_COMMUNITY): Payer: Medicare Other | Admitting: Speech Pathology

## 2018-07-27 ENCOUNTER — Inpatient Hospital Stay (HOSPITAL_COMMUNITY): Payer: Medicare Other | Admitting: Occupational Therapy

## 2018-07-27 DIAGNOSIS — I619 Nontraumatic intracerebral hemorrhage, unspecified: Secondary | ICD-10-CM

## 2018-07-27 DIAGNOSIS — M25512 Pain in left shoulder: Secondary | ICD-10-CM

## 2018-07-27 DIAGNOSIS — I10 Essential (primary) hypertension: Secondary | ICD-10-CM

## 2018-07-27 DIAGNOSIS — D62 Acute posthemorrhagic anemia: Secondary | ICD-10-CM

## 2018-07-27 DIAGNOSIS — G8929 Other chronic pain: Secondary | ICD-10-CM

## 2018-07-27 DIAGNOSIS — F5104 Psychophysiologic insomnia: Secondary | ICD-10-CM

## 2018-07-27 LAB — CBC WITH DIFFERENTIAL/PLATELET
Abs Immature Granulocytes: 0.02 10*3/uL (ref 0.00–0.07)
Basophils Absolute: 0.1 10*3/uL (ref 0.0–0.1)
Basophils Relative: 1 %
Eosinophils Absolute: 0.1 10*3/uL (ref 0.0–0.5)
Eosinophils Relative: 2 %
HCT: 24.8 % — ABNORMAL LOW (ref 39.0–52.0)
Hemoglobin: 7 g/dL — ABNORMAL LOW (ref 13.0–17.0)
Immature Granulocytes: 0 %
Lymphocytes Relative: 24 %
Lymphs Abs: 1.2 10*3/uL (ref 0.7–4.0)
MCH: 22.2 pg — ABNORMAL LOW (ref 26.0–34.0)
MCHC: 28.2 g/dL — ABNORMAL LOW (ref 30.0–36.0)
MCV: 78.7 fL — ABNORMAL LOW (ref 80.0–100.0)
Monocytes Absolute: 0.5 10*3/uL (ref 0.1–1.0)
Monocytes Relative: 10 %
Neutro Abs: 3.1 10*3/uL (ref 1.7–7.7)
Neutrophils Relative %: 63 %
Platelets: 291 10*3/uL (ref 150–400)
RBC: 3.15 MIL/uL — ABNORMAL LOW (ref 4.22–5.81)
RDW: 18 % — ABNORMAL HIGH (ref 11.5–15.5)
WBC: 5 10*3/uL (ref 4.0–10.5)
nRBC: 0 % (ref 0.0–0.2)

## 2018-07-27 LAB — COMPREHENSIVE METABOLIC PANEL
ALT: 19 U/L (ref 0–44)
AST: 17 U/L (ref 15–41)
Albumin: 2.9 g/dL — ABNORMAL LOW (ref 3.5–5.0)
Alkaline Phosphatase: 70 U/L (ref 38–126)
Anion gap: 6 (ref 5–15)
BUN: 21 mg/dL (ref 8–23)
CO2: 21 mmol/L — ABNORMAL LOW (ref 22–32)
Calcium: 8.5 mg/dL — ABNORMAL LOW (ref 8.9–10.3)
Chloride: 111 mmol/L (ref 98–111)
Creatinine, Ser: 0.93 mg/dL (ref 0.61–1.24)
GFR calc Af Amer: >60 mL/min (ref >60)
GFR calc non Af Amer: >60 mL/min (ref >60)
Glucose, Bld: 115 mg/dL — ABNORMAL HIGH (ref 70–99)
Potassium: 4.3 mmol/L (ref 3.5–5.1)
Sodium: 138 mmol/L (ref 135–145)
Total Bilirubin: 0.5 mg/dL (ref 0.3–1.2)
Total Protein: 6.1 g/dL — ABNORMAL LOW (ref 6.5–8.1)

## 2018-07-27 MED ORDER — MUSCLE RUB 10-15 % EX CREA
TOPICAL_CREAM | CUTANEOUS | Status: DC | PRN
  Administered 2018-07-28: 1 via TOPICAL
  Filled 2018-07-27: qty 85

## 2018-07-27 MED ORDER — MUSCLE RUB 10-15 % EX CREA: 1.0000 "application " | TOPICAL_CREAM | CUTANEOUS | 0 refills | Status: DC | PRN

## 2018-07-27 MED ORDER — ACETAMINOPHEN 325 MG PO TABS: 325.0000 mg | ORAL_TABLET | ORAL | Status: DC | PRN

## 2018-07-27 NOTE — Evaluation (Signed)
Speech Language Pathology Assessment and Plan  Patient Details  Name: [name] MRN: [id number] Date of Birth: [birth year]  SLP Diagnosis: Dysarthria;Cognitive Impairments  Rehab Potential: Fair ELOS:   3 weeks   Today's Date: 07/27/2018 SLP Individual Time: 1030-1130 SLP Individual Time Calculation (min): 60 min   Problem List:  Patient Active Problem List   Diagnosis Date Noted  . Agitation 07/26/2018  . Hepatitis C 07/26/2018  . Nontraumatic thalamic hemorrhage ([place]) 07/26/2018  . ICH (intracerebral hemorrhage) ([place]) 07/20/2018  . Osteoarthritis of left hip 04/06/2018  . Cameron ulcer, chronic 01/11/2018  . Microcytic anemia   . Symptomatic anemia 01/10/2018  . Syncope 01/10/2018  . Hiatal hernia with gastroesophageal reflux disease and esophagitis 01/05/2018  . Cigarette nicotine dependence without complication 23/76/2831  . Insomnia 01/05/2018  . Acute pain of left shoulder 11/21/2017  . Anemia, iron deficiency   . Gastrointestinal bleeding 10/24/2015  . Absolute anemia   . Tobacco abuse   . Acute ischemic stroke ([place])   . Acute CVA (cerebrovascular accident) ([place]) 05/31/2015  . Stroke ([place]) 05/31/2015  . Dyspnea 04/10/2014  . HTN (hypertension) 04/10/2014  . Inguinal hernia without mention of obstruction or gangrene, unilateral or unspecified, (not specified as recurrent)-right 01/30/2014   Past Medical History:  Past Medical History:  Diagnosis Date  . Arthritis   . Cameron ulcer, chronic 01/11/2018  . Chronic back pain   . GERD (gastroesophageal reflux disease)    uses Baking Soda  . GIB (gastrointestinal bleeding) 2012  . Glaucoma    right eye  . Headache    occasionally  . Hepatitis C   . Hiatal hernia with gastroesophageal reflux disease and esophagitis 01/05/2018  . History of blood transfusion    no abnormal reaction noted  . History of gout    doesn't take any meds  . Hyperlipidemia    not on any meds  . Hypertension    takes Amlodipine  and Lisinopril daily  . Insomnia    takes Ambien nightly  . Ischemic colitis ([place]) 2012  . Joint pain   . Nocturia   . Numbness    both legs occasionally  . PONV (postoperative nausea and vomiting)   . Prostate cancer ([place])   . Shortness of breath dyspnea    rarely but when notices he can be lying/sitting/exertion.Dr.Hochrein is aware per pt  . Stroke ([place]) 2016   . Urinary frequency   . Urinary urgency    Past Surgical History:  Past Surgical History:  Procedure Laterality Date  . APPENDECTOMY    . BIOPSY  01/11/2018   Procedure: BIOPSY;  Surgeon: Gatha Mayer, MD;  Location: [facility];  Service: Endoscopy;;  . COLONOSCOPY    . COLONOSCOPY N/A 10/26/2015   Procedure: COLONOSCOPY;  Surgeon: Doran Stabler, MD;  Location: [facility] ENDOSCOPY;  Service: Endoscopy;  Laterality: N/A;  . ESOPHAGOGASTRODUODENOSCOPY    . ESOPHAGOGASTRODUODENOSCOPY N/A 10/26/2015   Procedure: ESOPHAGOGASTRODUODENOSCOPY (EGD);  Surgeon: Doran Stabler, MD;  Location: [facility] ENDOSCOPY;  Service: Endoscopy;  Laterality: N/A;  . ESOPHAGOGASTRODUODENOSCOPY (EGD) WITH PROPOFOL N/A 01/11/2018   Procedure: ESOPHAGOGASTRODUODENOSCOPY (EGD) WITH PROPOFOL;  Surgeon: Gatha Mayer, MD;  Location: [facility];  Service: Endoscopy;  Laterality: N/A;  . INGUINAL HERNIA REPAIR Right 11/04/2014   Procedure: RIGHT INGUINAL HERNIA REPAIR WITH MESH;  Surgeon: Jackolyn Confer, MD;  Location: [place];  Service: General;  Laterality: Right;  . MASS EXCISION N/A 11/04/2014   Procedure: REMOVAL OF RIGHT GROIN SOFT  TISSUE MASS;  Surgeon: Jackolyn Confer, MD;  Location: [place];  Service: General;  Laterality: N/A;  . Multiple abdominal surgeries     total of 13  . PROSTATECTOMY    . SMALL INTESTINE SURGERY      Assessment / Plan / Recommendation Clinical Impression   [name] is a 68 year old male with history of right pontine CVA (Nov 2016), hep C, HTN, SOB, polysubstance abuse; who was admitted on 07/20/2018 with  worsening of slurred speech, right-sided weakness and right facial droop.  Patient was confused and agitated at admission and blood pressure noted to be elevated- 200/92.   UDS + THC.  Per report, CT head revealed left thalamic hemorrhage with mild edema and evidence of chronic ischemic microangiopathy. Dr. Leonie Man felt that bleed hypertensive in nature and ASA discontinued.  He continued to have issues with agitation question due to alcohol withdrawal ---Seroquel scheduled and treated with CIWA protocol. Mentation is improving but patient continues to be limited by moderate cognitive deficits affecting safety awareness, memory as well as reports of right visual field deficits, right hemiplegia with right lean affecting mobility and ADLs therapy. CIR recommended due to functional deficits with admission on 07/26/18.   Comprehensive cognitive linguistic evaluation completed on 07/27/18. Pt presents with moderate cognitive impairments. He is oriented to all (except for exact date - but states he would look at a calendar, one provided pt able to locate actual date). Pt with impairments in selective attention, memory (working memory, short term memory and recall of new information), semi-complex problem solving, emergent and anticipatory awareness and he performs most tasks with an overall increased processing time. Additionally, pt appears to have some right visual field deficits. Pt also presents with mild to moderate flaccid dysarthria d/t right facial weakness. Pt's speech is characterized by imprecise articulation and decreased vocal intensity. Pt's speech intelligibility is ~ 75% at simple sentence level. Skilled ST is required to target the above mentioned deficits, increase functional independence and reduce caregiver burden. Anticipate that pt will require supervision and follow up [place] at discharge from [place].    Skilled Therapeutic Interventions          Skilled treatment session focused on completion of above  mentioned assessment. SLP further facilitiated session by providing Mod A for increasing speech intelligibility at the simple sentence level.     SLP Assessment  Patient will need skilled [place] [facility]ology Services during CIR admission    Recommendations  Recommendations for Other Services: Neuropsych consult Patient destination: Home Follow up Recommendations: 24 hour supervision/assistance;Home Health SLP Equipment Recommended: None recommended by SLP    SLP Frequency 3 to 5 out of 7 days   SLP Duration  SLP Intensity  SLP Treatment/Interventions    Minumum of 1-2 x/day, 30 to 90 minutes  Cognitive remediation/compensation;Internal/external aids;Cueing hierarchy;Therapeutic Activities;Functional tasks;Patient/family education    Pain Pain Assessment Pain Scale: 0-10 Pain Score: 0-No pain  Prior Functioning Cognitive/Linguistic Baseline: Within functional limits Type of Home: Apartment  Lives With: Significant other;Daughter Available Help at Discharge: Friend(s);Available 24 hours/day Education: 11th grade Vocation: On disability  Short Term Goals: Week 1: SLP Short Term Goal 1 (Week 1): Pt will complete semi-complex problem solving tasks with Min A cues.  SLP Short Term Goal 2 (Week 1): Pt will self-monitor and self-correct errors with Min A cues.  SLP Short Term Goal 3 (Week 1): Pt will demonstrate selective attention in moderately distracting environment for ~ 30 minutes with supervision cues.  SLP Short  Term Goal 4 (Week 1): Pt will utilize speech intelligibility strategies with Min A cues to achieve speech intelligibility to > 90% at the sentence level.  SLP Short Term Goal 5 (Week 1): Pt will compensatory memory aids to recall information related to dialy schedule and activities with Min A cues.   Refer to Care Plan for Long Term Goals  Recommendations for other services: Neuropsych  Discharge Criteria: Patient will be discharged from SLP if patient  refuses treatment 3 consecutive times without medical reason, if treatment goals not met, if there is a change in medical status, if patient makes no progress towards goals or if patient is discharged from hospital.  The above assessment, treatment plan, treatment alternatives and goals were discussed and mutually agreed upon: by patient  Kinzee Happel 07/27/2018, 11:29 AM

## 2018-07-27 NOTE — Evaluation (Signed)
Physical Therapy Assessment and Plan  Patient Details  Name: [name] MRN: [id number] Date of Birth: 10-18-1950  PT Diagnosis: Abnormality of gait, Hemiparesis dominant and Impaired cognition Rehab Potential: Good ELOS: 21 days   Today's Date: 07/27/2018 PT Individual Time: 7680-8811 PT Individual Time Calculation (min): 85 min    Problem List:  Patient Active Problem List   Diagnosis Date Noted  . Agitation 07/26/2018  . Hepatitis C 07/26/2018  . Nontraumatic thalamic hemorrhage ([place]) 07/26/2018  . ICH (intracerebral hemorrhage) ([place]) 07/20/2018  . Osteoarthritis of left hip 04/06/2018  . Cameron ulcer, chronic 01/11/2018  . Microcytic anemia   . Symptomatic anemia 01/10/2018  . Syncope 01/10/2018  . Hiatal hernia with gastroesophageal reflux disease and esophagitis 01/05/2018  . Cigarette nicotine dependence without complication 09/30/9456  . Insomnia 01/05/2018  . Acute pain of left shoulder 11/21/2017  . Anemia, iron deficiency   . Gastrointestinal bleeding 10/24/2015  . Absolute anemia   . Tobacco abuse   . Acute ischemic stroke ([place])   . Acute CVA (cerebrovascular accident) ([place]) 05/31/2015  . Stroke ([place]) 05/31/2015  . Dyspnea 04/10/2014  . HTN (hypertension) 04/10/2014  . Inguinal hernia without mention of obstruction or gangrene, unilateral or unspecified, (not specified as recurrent)-right 01/30/2014    Past Medical History:  Past Medical History:  Diagnosis Date  . Arthritis   . Cameron ulcer, chronic 01/11/2018  . Chronic back pain   . GERD (gastroesophageal reflux disease)    uses Baking Soda  . GIB (gastrointestinal bleeding) 2012  . Glaucoma    right eye  . Headache    occasionally  . Hepatitis C   . Hiatal hernia with gastroesophageal reflux disease and esophagitis 01/05/2018  . History of blood transfusion    no abnormal reaction noted  . History of gout    doesn't take any meds  . Hyperlipidemia    not on any meds  . Hypertension     takes Amlodipine and Lisinopril daily  . Insomnia    takes Ambien nightly  . Ischemic colitis ([place]) 2012  . Joint pain   . Nocturia   . Numbness    both legs occasionally  . PONV (postoperative nausea and vomiting)   . Prostate cancer ([place])   . Shortness of breath dyspnea    rarely but when notices he can be lying/sitting/exertion.Dr.Hochrein is aware per pt  . Stroke ([place]) 2016   . Urinary frequency   . Urinary urgency    Past Surgical History:  Past Surgical History:  Procedure Laterality Date  . APPENDECTOMY    . BIOPSY  01/11/2018   Procedure: BIOPSY;  Surgeon: Gatha Mayer, MD;  [facility];  Service: Endoscopy;;  . COLONOSCOPY    . COLONOSCOPY N/A 10/26/2015   Procedure: COLONOSCOPY;  Surgeon: Doran Stabler, MD;  Location: [facility] ENDOSCOPY;  Service: Endoscopy;  Laterality: N/A;  . ESOPHAGOGASTRODUODENOSCOPY    . ESOPHAGOGASTRODUODENOSCOPY N/A 10/26/2015   Procedure: ESOPHAGOGASTRODUODENOSCOPY (EGD);  Surgeon: Doran Stabler, MD;  Location: [facility] ENDOSCOPY;  Service: Endoscopy;  Laterality: N/A;  . ESOPHAGOGASTRODUODENOSCOPY (EGD) WITH PROPOFOL N/A 01/11/2018   Procedure: ESOPHAGOGASTRODUODENOSCOPY (EGD) WITH PROPOFOL;  Surgeon: Gatha Mayer, MD;  [facility];  Service: Endoscopy;  Laterality: N/A;  . INGUINAL HERNIA REPAIR Right 11/04/2014   Procedure: RIGHT INGUINAL HERNIA REPAIR WITH MESH;  Surgeon: Jackolyn Confer, MD;  Location: [place];  Service: General;  Laterality: Right;  . MASS EXCISION N/A 11/04/2014   Procedure: REMOVAL  OF RIGHT GROIN SOFT TISSUE MASS;  Surgeon: Jackolyn Confer, MD;  Location: [place];  Service: General;  Laterality: N/A;  . Multiple abdominal surgeries     total of 13  . PROSTATECTOMY    . SMALL INTESTINE SURGERY      Assessment & Plan Clinical Impression: Patient is a 68 y.o. year old male with recent admission to the hospital on 07/20/2018  with history of prior CVA with residual left hemiparesis, hep C, HTN, S OB,  polysubstance abuse; who was admitted with worsening of slurred speech, right-sided weakness and right facial droop.  Patient was confused and agitated at admission and blood pressure noted to be elevated- 200/92.   UDS + THC. CT head done reviewed, showing left thalamic hemorrhage.  Patient was maintained on CIWA protocol and counseled on smoking and THC cessation.  Patient transferred to CIR on 07/26/2018 .   Patient currently requires max with mobility secondary to impaired timing and sequencing and decreased coordination and decreased attention, decreased problem solving, decreased safety awareness and decreased memory.  Prior to hospitalization, patient was modified independent  with mobility and lived with Significant other, Daughter in a [place] home.  Home access is  Level entry.  Patient will benefit from skilled PT intervention to maximize safe functional mobility, minimize fall risk and decrease caregiver burden for planned discharge home with 24 hour supervision.  Anticipate patient will benefit from follow up [place] at discharge.  PT - End of Session Activity Tolerance: Improving;Decreased this session;Tolerates 30+ min activity with multiple rests Endurance Deficit: Yes Endurance Deficit Description: fatigues with upright mobility with fall risk PT Assessment Rehab Potential (ACUTE/IP ONLY): Good PT Patient demonstrates impairments in the following area(s): Balance;Endurance;Motor;Pain;Safety PT Transfers Functional Problem(s): Bed Mobility;Bed to Chair;Car;Furniture;Floor PT Locomotion Functional Problem(s): Ambulation;Stairs;Wheelchair Mobility PT Plan PT Intensity: Minimum of 1-2 x/day ,45 to 90 minutes PT Frequency: 5 out of 7 days PT Duration Estimated Length of Stay: 21 days PT Treatment/Interventions: Ambulation/gait training;Stair training;Balance/vestibular training;Cognitive remediation/compensation;Therapeutic Exercise;Patient/family education;Therapeutic  Activities;DME/adaptive equipment instruction;UE/LE Coordination activities;UE/LE Strength taining/ROM;Functional mobility training;Wheelchair propulsion/positioning PT Transfers Anticipated Outcome(s): Supervision PT Locomotion Anticipated Outcome(s): Supervision PT Recommendation Follow Up Recommendations: Home health PT;24 hour supervision/assistance Patient destination: Home Equipment Recommended: Rolling walker with 5" wheels  Skilled Therapeutic Intervention Patient seen for initial evaluation and able to participate in gait training with RW x 15' with mod to max A for balance/weight shift, safety and cues for posture R foot clearance/sequence.  He sat EOB for sitting dynamic balance activity to eat breakfast with min A for balance and overall set up for meal using L hand to eat.  He participated in w/c mobility training as well for mobility on the unit x 50' with S and cues bilateral UE's to propel.  Performed car transfer stand step with walker and max A.  Patient fatigued with session assist via squat pivot to L to recliner and left in recliner with alarm belt and needs in reach.   PT Evaluation Precautions/Restrictions Precautions Precautions: Fall Precaution Comments: right hemiplegia, right lean Restrictions Weight Bearing Restrictions: No General   Vital Signs  Pain Pain Assessment Pain Scale: 0-10 Pain Score: 0-No pain Pain Type: Chronic pain Pain Location: Shoulder Pain Orientation: Left Pain Radiating Towards: arm Pain Descriptors / Indicators: Aching Pain Frequency: Intermittent Pain Onset: On-going Patients Stated Pain Goal: 4 Pain Intervention(s): Repositioned;RN made aware Home Living/Prior Functioning Home Living Available Help at Discharge: Friend(s);Available 24 hours/day Type of Home: Apartment Home Access: Level entry Home Layout:  One level Bathroom Shower/Tub: Multimedia programmer: Handicapped height Additional Comments: Pt lives with  girlfriend who cares for a 2 and 9yo during the day  Lives With: Significant other;Daughter Prior Function Level of Independence: Independent with basic ADLs;Independent with gait;Requires assistive device for independence  Able to Take Stairs?: Yes Driving: Yes Vocation: On disability Leisure: Hobbies-yes (Comment) Comments: used to love football Vision/Perception  Perception Perception: Not tested Praxis Praxis: Not tested  Cognition Overall Cognitive Status: Impaired/Different from baseline Arousal/Alertness: Awake/alert Orientation Level: Oriented X4 Attention: Selective Selective Attention: Impaired Selective Attention Impairment: Verbal complex;Functional complex Memory: Impaired Memory Impairment: Decreased recall of new information;Decreased short term memory;Retrieval deficit Decreased Short Term Memory: Verbal complex;Functional complex Awareness: Impaired Awareness Impairment: Emergent impairment;Anticipatory impairment Problem Solving: Impaired Problem Solving Impairment: Verbal complex;Functional complex Executive Function: Reasoning Reasoning: Impaired Reasoning Impairment: Verbal complex;Functional complex Safety/Judgment: Impaired Comments: decreased awareness of right, decreased emergent/anticipatory awareness Sensation Sensation Light Touch: Appears Intact Hot/Cold: Appears Intact Coordination Gross Motor Movements are Fluid and Coordinated: No Fine Motor Movements are Fluid and Coordinated: No Coordination and Movement Description: decreased R compared to L coordination UE and LE Motor  Motor Motor: Hemiplegia  Mobility Bed Mobility Bed Mobility: Supine to Sit Supine to Sit: Moderate Assistance - Patient 50-74%(assist for R LE off bed and to lift trunk) Transfers Transfers: Sit to Stand;Stand to Sit;Stand Pivot Transfers;Squat Pivot Transfers Sit to Stand: Maximal Assistance - Patient 25-49% Stand to Sit: Moderate Assistance - Patient  50-74% Stand Pivot Transfers: Maximal Assistance - Patient 25 - 49% Stand Pivot Transfer Details (indicate cue type and reason): with walker able to step with cues, facilitation for weight shift and assist to prevent R LOB Squat Pivot Transfers: Maximal Assistance - Patient 25-49% Transfer (Assistive device): Rolling walker Locomotion  Gait Ambulation: Yes Gait Assistance: Maximal Assistance - Patient 25-49%;Moderate Assistance - Patient 50-74% Gait Distance (Feet): 15 Feet(x 2) Assistive device: Rolling walker Gait Assistance Details: Verbal cues for gait pattern;Verbal cues for safe use of DME/AE;Manual facilitation for weight shifting;Verbal cues for precautions/safety Gait Assistance Details: facilitation for L lateral weight shift to allow for R foot clearance, cues for posture pt c/o falling, but needed cues for upright posture due to R and anterior leaning/LOB; at time mod A for balance and weight shift, and max A for safety when LOB Stairs / Additional Locomotion Stairs: Yes Stairs Assistance: Maximal Assistance - Patient 25 - 49% Stair Management Technique: Two rails;Step to pattern;Sideways;Forwards Number of Stairs: 2 Height of Stairs: 3 Architect: Yes Wheelchair Assistance: Chartered loss adjuster: Both upper extremities Wheelchair Parts Management: Needs assistance Distance: 50  Trunk/Postural Assessment  Cervical Assessment Cervical Assessment: Exceptions to WFL(forward head, lateral shift to L) Thoracic Assessment Thoracic Assessment: Exceptions to WFL(rounded shoulders) Lumbar Assessment Lumbar Assessment: Exceptions to WFL(lumbopelvic weight shift to R) Postural Control Postural Control: Deficits on evaluation Righting Reactions: increased time to recover from R lateral LOB Postural Limitations: leans R and forward in sitting and standing  Balance Balance Balance Assessed: Yes Static Sitting Balance Static  Sitting - Balance Support: Right upper extremity supported;Feet supported Static Sitting - Level of Assistance: 5: Stand by assistance Dynamic Sitting Balance Dynamic Sitting - Balance Support: No upper extremity supported;During functional activity;Feet supported Dynamic Sitting - Level of Assistance: 4: Min assist Dynamic Sitting - Balance Activities: Reaching across midline;Forward lean/weight shifting;Reaching for objects Sitting balance - Comments: sitting EOB about 10 min for feeding himself breakfast Static Standing Balance Static Standing - Balance Support:  Bilateral upper extremity supported Static Standing - Level of Assistance: 3: Mod assist Dynamic Standing Balance Dynamic Standing - Balance Support: Bilateral upper extremity supported Dynamic Standing - Level of Assistance: 3: Mod assist;2: Max assist Extremity Assessment  RUE Assessment RUE Assessment: Exceptions to [facility] Passive Range of Motion (PROM) Comments: WFL Active Range of Motion (AROM) Comments: limited shoulder flexion due to strength deficits RUE Body System: Neuro RUE AROM (degrees) Overall AROM Right Upper Extremity: Deficits RUE Overall AROM Comments: limited shoulder elevation due to weakness RUE Strength RUE Overall Strength: Deficits RUE Overall Strength Comments: Shoulder elevation 3-/5, elbow flexion 3+/5, extension 4-/5, grip 4-/5 LUE Assessment LUE Assessment: Exceptions to [facility] Passive Range of Motion (PROM) Comments: WFL Active Range of Motion (AROM) Comments: Limited shoulder elevation due to pain/weakness in shoulder General Strength Comments: Shoulder elevation 2+/5, elbow flexion 4/5, extension 4/5 RLE Assessment RLE Assessment: Exceptions to [facility] Passive Range of Motion (PROM) Comments: WFL Active Range of Motion (AROM) Comments: WFL General Strength Comments: hip flexion 3-/5, knee extension 4-/5, ankle DF 4+/5 LLE Assessment LLE Assessment: Within Functional Limits    Refer to Care Plan  for Long Term Goals  Recommendations for other services: Therapeutic Recreation  Outing/community reintegration  Discharge Criteria: Patient will be discharged from PT if patient refuses treatment 3 consecutive times without medical reason, if treatment goals not met, if there is a change in medical status, if patient makes no progress towards goals or if patient is discharged from hospital.  The above assessment, treatment plan, treatment alternatives and goals were discussed and mutually agreed upon: by patient  Reginia Naas 07/27/2018, 11:20 AM  Magda Kiel, [place] 07/27/2018

## 2018-07-27 NOTE — Progress Notes (Signed)
Per nursing, patient was given "Data Collection Information Summary for Patients in Inpatient Rehabilitation Facilities with attached Privacy Act Statement Health Care Records" upon admission.    Patient information reviewed and entered into eRehab System by Becky Durinda Buzzelli, PPS coordinator. Information including medical coding, function ability, and quality indicators will be reviewed and updated through discharge.   

## 2018-07-27 NOTE — Progress Notes (Signed)
[facility] PHYSICAL MEDICINE & REHABILITATION PROGRESS NOTE   Subjective/Complaints: Pt sleeping when I arrived. Stated he didn't sleep at all last night. Couldn't get comfortable  ROS: Patient denies fever, rash, sore throat, blurred vision, nausea, vomiting, diarrhea, cough, shortness of breath or chest pain, joint or back pain, headache, or mood change.    Objective:   No results found. Recent Labs    07/26/18 0507 07/27/18 0553  WBC 5.6 5.0  HGB 7.7* 7.0*  HCT 27.8* 24.8*  PLT 295 291   Recent Labs    07/26/18 0507 07/27/18 0553  NA 140 138  K 4.1 4.3  CL 112* 111  CO2 19* 21*  GLUCOSE 114* 115*  BUN 20 21  CREATININE 0.96 0.93  CALCIUM 8.4* 8.5*    Intake/Output Summary (Last 24 hours) at 07/27/2018 0846 Last data filed at 07/27/2018 0339 Gross per 24 hour  Intake -  Output 325 ml  Net -325 ml     Physical Exam: Vital Signs Blood pressure 126/76, pulse 78, temperature 99.1 F (37.3 C), temperature source Oral, resp. rate 18, height 5\' 9"  (1.753 m), weight 91.4 kg, SpO2 99 %.  Constitutional: No distress . Vital signs reviewed. HEENT: EOMI, oral membranes moist Neck: supple Cardiovascular: RRR without murmur. No JVD    Respiratory: CTA Bilaterally without wheezes or rales. Normal effort    GI: BS +, non-tender, non-distended  Musculoskeletal:     Comments: left shoudler TTP and with PROM, 1/2" sublux Neurological: alert, dysarthric. Follows basic commands. Fair insight.   Motor: RUE: 4/5 proximal to distal LUE: 2/5 prox to 4-/5 distally (pain) RLE: HF, KE 3/5, ADF 4/5 LLE: HF, KE 3 to 3+/5, ADF 4/5  Skin: Skin is warm and dry.  Multiple healed lesions (bed bug bites) on bilateral shins  Psychiatric: flat.    Assessment/Plan: 1. Functional deficits secondary to left thalamic hemorrhage which require 3+ hours per day of interdisciplinary therapy in a comprehensive inpatient rehab setting.  Physiatrist is providing close team supervision and 24 hour  management of active medical problems listed below.  Physiatrist and rehab team continue to assess barriers to discharge/monitor patient progress toward functional and medical goals  Care Tool:  Bathing              Bathing assist       Upper Body Dressing/Undressing Upper body dressing        Upper body assist      Lower Body Dressing/Undressing Lower body dressing            Lower body assist       Toileting Toileting    Toileting assist       Transfers Chair/bed transfer  Transfers assist           Locomotion Ambulation   Ambulation assist              Walk 10 feet activity   Assist           Walk 50 feet activity   Assist           Walk 150 feet activity   Assist           Walk 10 feet on uneven surface  activity   Assist           Wheelchair     Assist               Wheelchair 50 feet with 2 turns activity    Assist  Wheelchair 150 feet activity     Assist           Medical Problem List and Plan: 1.  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  -beginning therapies today 2.  DVT Prophylaxis/Anticoagulation: Pharmaceutical: Lovenox 3.  History of chronic back pain/headaches/pain Management: Tylenol as needed for now  -c/o significant left shoulder pain ("since first stroke"), suspect DJD/RTC/hemiplegic shoulder pain.    - check xr   -start with muscle rub   -support left shoulder at rest, ROM with therapies 4. Mood: LCSW to follow for evaluation and support 5. Neuropsych: This patient is not fully capable of making decisions on his own behalf. 6. Skin/Wound Care: Routine pressure relief measures 7. Fluids/Electrolytes/Nutrition: encourage PO  -I personally reviewed the patient's labs today.  WNL  8.  HTN: Monitor blood pressures 4 times daily.  Continue amlodipine and lisinopril-- titrate medications  to control.   -bp wiithin range today 9.  Acute on chronic blood loss anemia: 9.0--> 7.7 --> 7.0 1/9  -need stool guaiacs  - monitor for signs of bleeding.  -Resumed iron supplement 10. Chronic Insomnia/sleep state misperception: Will monitor sleep wake cycle.  Ttrazodone 100 mg nightly ineffective--resume home regimen of trazodone 50mg   and Ambien 5 mg (for now)  -pt stated he "was lying with his eyes closed" when I arrived, yet he didn't respond until I called his name the 4th time---this would lead me to believe that he might be actually sleeping more often than not    LOS: 1 days A FACE TO FACE EVALUATION WAS PERFORMED  Meredith Staggers 07/27/2018, 8:46 AM

## 2018-07-28 ENCOUNTER — Inpatient Hospital Stay (HOSPITAL_COMMUNITY): Payer: Medicare Other | Admitting: Physical Therapy

## 2018-07-28 ENCOUNTER — Inpatient Hospital Stay (HOSPITAL_COMMUNITY): Payer: Medicare Other | Admitting: Occupational Therapy

## 2018-07-28 ENCOUNTER — Inpatient Hospital Stay (HOSPITAL_COMMUNITY): Payer: Medicare Other | Admitting: Speech Pathology

## 2018-07-28 ENCOUNTER — Inpatient Hospital Stay (HOSPITAL_COMMUNITY): Payer: Medicare Other

## 2018-07-28 DIAGNOSIS — D500 Iron deficiency anemia secondary to blood loss (chronic): Secondary | ICD-10-CM

## 2018-07-28 DIAGNOSIS — K257 Chronic gastric ulcer without hemorrhage or perforation: Secondary | ICD-10-CM

## 2018-07-28 DIAGNOSIS — K2951 Unspecified chronic gastritis with bleeding: Secondary | ICD-10-CM

## 2018-07-28 LAB — FERRITIN: Ferritin: 6 ng/mL — ABNORMAL LOW (ref 24–336)

## 2018-07-28 LAB — CBC
HCT: 24.1 % — ABNORMAL LOW (ref 39.0–52.0)
Hemoglobin: 6.9 g/dL — CL (ref 13.0–17.0)
MCH: 22.7 pg — ABNORMAL LOW (ref 26.0–34.0)
MCHC: 28.6 g/dL — ABNORMAL LOW (ref 30.0–36.0)
MCV: 79.3 fL — ABNORMAL LOW (ref 80.0–100.0)
Platelets: 320 10*3/uL (ref 150–400)
RBC: 3.04 MIL/uL — ABNORMAL LOW (ref 4.22–5.81)
RDW: 17.8 % — ABNORMAL HIGH (ref 11.5–15.5)
WBC: 6 10*3/uL (ref 4.0–10.5)
nRBC: 0 % (ref 0.0–0.2)

## 2018-07-28 LAB — IRON AND TIBC
Iron: 18 ug/dL — ABNORMAL LOW (ref 45–182)
Saturation Ratios: 5 % — ABNORMAL LOW (ref 17.9–39.5)
TIBC: 393 ug/dL (ref 250–450)
UIBC: 375 ug/dL

## 2018-07-28 LAB — OCCULT BLOOD X 1 CARD TO LAB, STOOL: Fecal Occult Bld: POSITIVE — AB

## 2018-07-28 LAB — PREPARE RBC (CROSSMATCH)

## 2018-07-28 MED ORDER — DIPHENHYDRAMINE HCL 25 MG PO CAPS
25.0000 mg | ORAL_CAPSULE | Freq: Once | ORAL | Status: AC
  Administered 2018-07-28: 25 mg via ORAL
  Filled 2018-07-28: qty 1

## 2018-07-28 MED ORDER — SODIUM CHLORIDE 0.9% IV SOLUTION
Freq: Once | INTRAVENOUS | Status: AC
  Administered 2018-07-28: 12:00:00 via INTRAVENOUS

## 2018-07-28 MED ORDER — MUSCLE RUB 10-15 % EX CREA
TOPICAL_CREAM | Freq: Two times a day (BID) | CUTANEOUS | Status: DC
  Administered 2018-07-28: 1 via TOPICAL
  Administered 2018-07-28 – 2018-07-31 (×7): via TOPICAL
  Administered 2018-08-01: 1 via TOPICAL
  Administered 2018-08-01: 19:00:00 via TOPICAL
  Administered 2018-08-02: 1 via TOPICAL
  Administered 2018-08-02 – 2018-08-07 (×10): via TOPICAL
  Administered 2018-08-08: 1 via TOPICAL
  Administered 2018-08-08 – 2018-08-18 (×15): via TOPICAL
  Filled 2018-07-28 (×2): qty 85

## 2018-07-28 MED ORDER — FERROUS SULFATE 325 (65 FE) MG PO TABS
325.0000 mg | ORAL_TABLET | Freq: Every day | ORAL | Status: DC
  Administered 2018-07-29 – 2018-08-18 (×21): 325 mg via ORAL
  Filled 2018-07-28 (×21): qty 1

## 2018-07-28 MED ORDER — ACETAMINOPHEN 325 MG PO TABS
650.0000 mg | ORAL_TABLET | Freq: Once | ORAL | Status: AC
  Administered 2018-07-28: 650 mg via ORAL
  Filled 2018-07-28: qty 2

## 2018-07-28 MED ORDER — SODIUM CHLORIDE 0.9 % IV SOLN
510.0000 mg | INTRAVENOUS | Status: AC
  Administered 2018-07-28: 510 mg via INTRAVENOUS
  Filled 2018-07-28 (×2): qty 17

## 2018-07-28 MED ORDER — FUROSEMIDE 10 MG/ML IJ SOLN
20.0000 mg | Freq: Once | INTRAMUSCULAR | Status: AC
  Administered 2018-07-28: 20 mg via INTRAVENOUS
  Filled 2018-07-28: qty 2

## 2018-07-28 NOTE — Progress Notes (Signed)
Speech Language Pathology Daily Session Note  Patient Details  Name: [name] MRN: [id number] Date of Birth: 1950/10/10  Today's Date: 07/28/2018 SLP Individual Time: 1400-1500 SLP Individual Time Calculation (min): 60 min  Short Term Goals: Week 1: SLP Short Term Goal 1 (Week 1): Pt will complete semi-complex problem solving tasks with Min A cues.  SLP Short Term Goal 2 (Week 1): Pt will self-monitor and self-correct errors with Min A cues.  SLP Short Term Goal 3 (Week 1): Pt will demonstrate selective attention in moderately distracting environment for ~ 30 minutes with supervision cues.  SLP Short Term Goal 4 (Week 1): Pt will utilize speech intelligibility strategies with Min A cues to achieve speech intelligibility to > 90% at the sentence level.  SLP Short Term Goal 5 (Week 1): Pt will compensatory memory aids to recall information related to dialy schedule and activities with Min A cues.   Skilled Therapeutic Interventions:  Skilled treatment session focused on cognition goals. SLP facilitated session by providing [place] [employer]. Pt obtained score of 15 out of 22 (n=>18). Pt with deficits in recall. Pt reports that his memory was impaired "for years before" stroke. His girlfriend "keeps up" with everything. Pt with improved selective attention task for ~ 30 minutes with several interruptions by nursing. Of note, pt's speech intelligibility is improved and > 90% at simple sentences. Will continue to monitor speech intelligibility particularly in increased noised. Pt was left upright in recliner, lap alarm on and all needs within reach. Continue per current plan of care.      Pain Pain Assessment Pain Scale: Faces Faces Pain Scale: No hurt   Therapy/Group: Individual Therapy  Rayetta Veith 07/28/2018, 3:42 PM

## 2018-07-28 NOTE — Progress Notes (Signed)
[facility] PHYSICAL MEDICINE & REHABILITATION PROGRESS NOTE   Subjective/Complaints: Still having left shoulder pain.  Impacts ability to use left arm.   ROS: Patient denies fever, rash, sore throat, blurred vision, nausea, vomiting, diarrhea, cough, shortness of breath or chest pain,   headache, or mood change.    Objective:   Dg Shoulder Left  Result Date: 07/27/2018 CLINICAL DATA:  Generalized left shoulder pain and limited range of motion after having a stroke this week. No reported injury. EXAM: LEFT SHOULDER - 2+ VIEW COMPARISON:  None. FINDINGS: No fracture or bone lesion. Glenohumeral AC joints are normally aligned. No significant degenerative/arthropathic change. Bones are demineralized. Surrounding soft tissues are unremarkable. IMPRESSION: No fracture, bone lesion or significant joint abnormality. Electronically Signed   By: Lajean Manes M.D.   On: 07/27/2018 13:59   Recent Labs    07/27/18 0553 07/28/18 0716  WBC 5.0 6.0  HGB 7.0* 6.9*  HCT 24.8* 24.1*  PLT 291 320   Recent Labs    07/26/18 0507 07/27/18 0553  NA 140 138  K 4.1 4.3  CL 112* 111  CO2 19* 21*  GLUCOSE 114* 115*  BUN 20 21  CREATININE 0.96 0.93  CALCIUM 8.4* 8.5*    Intake/Output Summary (Last 24 hours) at 07/28/2018 0951 Last data filed at 07/28/2018 0900 Gross per 24 hour  Intake 702 ml  Output 850 ml  Net -148 ml     Physical Exam: Vital Signs Blood pressure 131/75, pulse 79, temperature 98.6 F (37 C), resp. rate 19, height 5\' 9"  (1.753 m), weight 91.4 kg, SpO2 100 %.  Constitutional: No distress . Vital signs reviewed. HEENT: EOMI, oral membranes moist Neck: supple Cardiovascular: RRR without murmur. No JVD    Respiratory: CTA Bilaterally without wheezes or rales. Normal effort    GI: BS +, non-tender, non-distended  Musculoskeletal:     Comments: left shoudler TTP and with PROM, 1/2" sublux Neurological: alert, dysarthric. Follows basic commands. Fair insight.   Motor: RUE:  4/5 proximal to distal LUE: 2/5 prox to 4-/5 distally (pain) RLE: HF, KE 3/5, ADF 4/5 LLE: HF, KE 3 to 3+/5, ADF 4/5  Skin: Skin is warm and dry.  Multiple healed lesions (bed bug bites) on bilateral shins  Psychiatric: flat.    Assessment/Plan: 1. Functional deficits secondary to left thalamic hemorrhage which require 3+ hours per day of interdisciplinary therapy in a comprehensive inpatient rehab setting.  Physiatrist is providing close team supervision and 24 hour management of active medical problems listed below.  Physiatrist and rehab team continue to assess barriers to discharge/monitor patient progress toward functional and medical goals  Care Tool:  Bathing    Body parts bathed by patient: Right arm, Left arm, Chest, Abdomen, Front perineal area, Right upper leg, Left upper leg, Face   Body parts bathed by helper: Buttocks, Right lower leg, Left lower leg     Bathing assist Assist Level: Moderate Assistance - Patient 50 - 74%     Upper Body Dressing/Undressing Upper body dressing   What is the patient wearing?: Pull over shirt    Upper body assist Assist Level: Minimal Assistance - Patient > 75%    Lower Body Dressing/Undressing Lower body dressing      What is the patient wearing?: Pants, Underwear/pull up     Lower body assist Assist for lower body dressing: Maximal Assistance - Patient 25 - 49%     Toileting Toileting Toileting Activity did not occur (Clothing management and hygiene only):  N/A (no void or bm)  Toileting assist       Transfers Chair/bed transfer  Transfers assist     Chair/bed transfer assist level: Maximal Assistance - Patient 25 - 49%     Locomotion Ambulation   Ambulation assist      Assist level: Maximal Assistance - Patient 25 - 49% Assistive device: Walker-rolling Max distance: 15   Walk 10 feet activity   Assist     Assist level: Maximal Assistance - Patient 25 - 49%     Walk 50 feet  activity   Assist Walk 50 feet with 2 turns activity did not occur: Safety/medical concerns         Walk 150 feet activity   Assist Walk 150 feet activity did not occur: Safety/medical concerns         Walk 10 feet on uneven surface  activity   Assist Walk 10 feet on uneven surfaces activity did not occur: Safety/medical concerns         Wheelchair     Assist Will patient use wheelchair at discharge?: No Type of Wheelchair: Manual    Wheelchair assist level: Supervision/Verbal cueing Max wheelchair distance: 50    Wheelchair 50 feet with 2 turns activity    Assist    Wheelchair 50 feet with 2 turns activity did not occur: Safety/medical concerns       Wheelchair 150 feet activity     Assist Wheelchair 150 feet activity did not occur: Safety/medical concerns         Medical Problem List and Plan: 1.  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  -Continue CIR therapies including PT, OT  2.  DVT Prophylaxis/Anticoagulation: Pharmaceutical: Lovenox 3.  History of chronic back pain/headaches/pain Management: Tylenol as needed for now  -c/o significant left shoulder pain ("since first stroke"), suspect DJD/RTC/hemiplegic shoulder pain.    -shoulder xray fairly reviewed/fairly unremarkable   -schedule with muscle rub   -support left shoulder at rest, ROM with therapies, modaliteis   -consider steroid injection 4. Mood: LCSW to follow for evaluation and support 5. Neuropsych: This patient is not fully capable of making decisions on his own behalf. 6. Skin/Wound Care: Routine pressure relief measures 7. Fluids/Electrolytes/Nutrition: encourage PO  -I personally reviewed the patient's labs today.  WNL  8.  HTN: Monitor blood pressures 4 times daily.  Continue amlodipine and lisinopril-- titrate medications to control.   -bp wiithin range today 1/10 9.  Acute on chronic blood loss anemia:  9.0--> 7.7 --> 7.0 1/9---> 6.9 1/10  -will go ahead and transfuse today given his history  -need stool guaiacs--spoke with RN today  -Resumed iron supplement  -iron studies all c/w iron deficiency anemia  -pt has seen [facility] for a large HH which has led to friability of his gastric mucosa. Will contact them to discuss his case.  10. Chronic Insomnia/sleep state misperception:    Ttrazodone 100 mg nightly ineffective--resume home regimen of trazodone 50mg   and Ambien 5 mg (for now)  - sleep is fair at present  LOS: 2 days A FACE TO [place] 07/28/2018, 9:51 AM

## 2018-07-28 NOTE — Progress Notes (Signed)
Physical Therapy Session Note  Patient Details  Name: [name] MRN: [id number] Date of Birth: 1951-06-27  Today's Date: 07/28/2018 PT Individual Time: 0930-1025 PT Individual Time Calculation (min): 55 min   Short Term Goals: Week 1:  PT Short Term Goal 1 (Week 1): Patient to demonstrate transfers consistently with mod A PT Short Term Goal 2 (Week 1): Patient to perform w/c management with S x 150'. PT Short Term Goal 3 (Week 1): Patient to ambulate with mod A x 50' PT Short Term Goal 4 (Week 1): Patient to demonstrate sitting balance during functional task with S  Skilled Therapeutic Interventions/Progress Updates:    pt performs squat pivot transfers throughout session with mod/max A due to impaired motor planning and weakness.  Sit <> stand repetitions with mod/max A with manual facilitation at hips and trunk for postural control, blocking Rt knee to prevent buckling.  Standing reaching task with focus on wt shifts Lt to reduce Rt knee buckling with pt improving with repetition, requiring mod manual facilitation.  Reaching limited by decreased bilat UE ROM.  Gait with RW 2 x 20' with mod A, facilitation for wt shifts, posture and safety with turns.  Sit <> stand training with RW with pt intially max A, progresses to mod A with repetition.  W/c mobility x 100', distance limited by Lt UE pain.  Kinetron for bilat LE strength 3 x 25 revolutions.  Pt left in room with alarm set, needs at hand.  Therapy Documentation Precautions:  Precautions Precautions: Fall Precaution Comments: right hemiplegia, right lean Restrictions Weight Bearing Restrictions: No Pain: Pt reports "cramping" in neck at end of session, RN made aware   Therapy/Group: Individual Therapy  DONAWERTH,KAREN 07/28/2018, 10:27 AM

## 2018-07-28 NOTE — Progress Notes (Signed)
Social Work  Social Work Assessment and Plan  Patient Details  Name: [name] MRN: [id number] Date of Birth: 06-25-1951  Today's Date: 07/28/2018  Problem List:  Patient Active Problem List   Diagnosis Date Noted  . Agitation 07/26/2018  . Hepatitis C 07/26/2018  . Nontraumatic thalamic hemorrhage ([place]) 07/26/2018  . ICH (intracerebral hemorrhage) ([place]) 07/20/2018  . Osteoarthritis of left hip 04/06/2018  . Cameron ulcer, chronic 01/11/2018  . Microcytic anemia   . Symptomatic anemia 01/10/2018  . Syncope 01/10/2018  . Hiatal hernia with gastroesophageal reflux disease and esophagitis 01/05/2018  . Cigarette nicotine dependence without complication 78/29/5621  . Insomnia 01/05/2018  . Acute pain of left shoulder 11/21/2017  . Anemia, iron deficiency   . Gastrointestinal bleeding 10/24/2015  . Absolute anemia   . Tobacco abuse   . Acute ischemic stroke ([place])   . Acute CVA (cerebrovascular accident) ([place]) 05/31/2015  . Stroke ([place]) 05/31/2015  . Dyspnea 04/10/2014  . HTN (hypertension) 04/10/2014  . Inguinal hernia without mention of obstruction or gangrene, unilateral or unspecified, (not specified as recurrent)-right 01/30/2014   Past Medical History:  Past Medical History:  Diagnosis Date  . Arthritis   . Cameron ulcer, chronic 01/11/2018  . Chronic back pain   . GERD (gastroesophageal reflux disease)    uses Baking Soda  . GIB (gastrointestinal bleeding) 2012  . Glaucoma    right eye  . Headache    occasionally  . Hepatitis C   . Hiatal hernia with gastroesophageal reflux disease and esophagitis 01/05/2018  . History of blood transfusion    no abnormal reaction noted  . History of gout    doesn't take any meds  . Hyperlipidemia    not on any meds  . Hypertension    takes Amlodipine and Lisinopril daily  . Insomnia    takes Ambien nightly  . Ischemic colitis ([place]) 2012  . Joint pain   . Nocturia   . Numbness    both legs occasionally  . PONV  (postoperative nausea and vomiting)   . Prostate cancer ([place])   . Shortness of breath dyspnea    rarely but when notices he can be lying/sitting/exertion.Dr.Hochrein is aware per pt  . Stroke ([place]) 2016   . Urinary frequency   . Urinary urgency    Past Surgical History:  Past Surgical History:  Procedure Laterality Date  . APPENDECTOMY    . BIOPSY  01/11/2018   Procedure: BIOPSY;  Surgeon: Gatha Mayer, MD;  Location: [facility] ENDOSCOPY;  Service: Endoscopy;;  . COLONOSCOPY    . COLONOSCOPY N/A 10/26/2015   Procedure: COLONOSCOPY;  Surgeon: Doran Stabler, MD;  Location: [facility] ENDOSCOPY;  Service: Endoscopy;  Laterality: N/A;  . ESOPHAGOGASTRODUODENOSCOPY    . ESOPHAGOGASTRODUODENOSCOPY N/A 10/26/2015   Procedure: ESOPHAGOGASTRODUODENOSCOPY (EGD);  Surgeon: Doran Stabler, MD;  Location: [facility] ENDOSCOPY;  Service: Endoscopy;  Laterality: N/A;  . ESOPHAGOGASTRODUODENOSCOPY (EGD) WITH PROPOFOL N/A 01/11/2018   Procedure: ESOPHAGOGASTRODUODENOSCOPY (EGD) WITH PROPOFOL;  Surgeon: Gatha Mayer, MD;  Location: [facility] ENDOSCOPY;  Service: Endoscopy;  Laterality: N/A;  . INGUINAL HERNIA REPAIR Right 11/04/2014   Procedure: RIGHT INGUINAL HERNIA REPAIR WITH MESH;  Surgeon: Jackolyn Confer, MD;  Location: [facility];  Service: General;  Laterality: Right;  . MASS EXCISION N/A 11/04/2014   Procedure: REMOVAL OF RIGHT GROIN SOFT TISSUE MASS;  Surgeon: Jackolyn Confer, MD;  Location: [facility];  Service: General;  Laterality: N/A;  . Multiple abdominal surgeries  total of 13  . PROSTATECTOMY    . SMALL INTESTINE SURGERY     Social History:  reports that he has been smoking cigarettes. He has a 25.00 pack-year smoking history. He has never used smokeless tobacco. He reports current alcohol use. He reports that he does not use drugs.  Family / Support Systems Marital Status: Separated Patient Roles: Other (Comment), Parent(has fiance) Spouse/Significant Other: fiance, Cynthis Ellerby @ (C) [phone] Children:  daughter, Kendra [place]ique (local) @ (C) [phone];  daughter, Theodis Blaze @ (C) [phone];  daughter Jonelle Sidle (local) Other Supports: sister, Iantha Fallen (local) @ (H) [phone] and brother, Adrian Prince (local) @ (C) [phone] Anticipated Caregiver: Fiance and daugther  Ability/Limitations of Caregiver: Min A Caregiver Availability: 24/7 Family Dynamics: Patient has fianc and family are all very supportive.  Notes 1 daughter is a long-distance Administrator and not always available to assist.  States he sees family members almost every day.  Social History Preferred language: English Religion: Baptist Cultural Background: N/A Read: Yes Write: Yes Employment Status: Retired Public relations account executive Issues: None Guardian/Conservator: None - per MD, pt is not yet fully capable of making decisions on his own behalf.  Defer to daughter.   Abuse/Neglect Abuse/Neglect Assessment Can Be Completed: Yes Physical Abuse: Denies Verbal Abuse: Denies Sexual Abuse: Denies Exploitation of patient/patient's resources: Denies Self-Neglect: Denies  Emotional Status Pt's affect, behavior and adjustment status: Patient sitting up in wheelchair and receiving blood transfusion.  Able to complete assessment interview without much difficulty, however, does self-correct  general information about family members.  Patient denies any significant emotional distress.  Will monitor while here and referral for neuropsychology as indicated Recent Psychosocial Issues: None Psychiatric History: None Substance Abuse History: Patient denies any substance abuse issues.  He states "maybe of beer or wine every now and then... But I will smoke a blunt."  Patient does not feel that his THC use was a concern as it helps with pain control.  Patient / Family Perceptions, Expectations & Goals Pt/Family understanding of illness & functional limitations: Patient with very basic understanding of his stroke  and states it was likely caused by blood pressure which is related to his "high hernia". Premorbid pt/family roles/activities: Patient independent PTA Anticipated changes in roles/activities/participation: Per therapy goals of supervision to contact-guard assist, fianc and family will need to increase physical assistance they provide. Pt/family expectations/goals: "I just got a get up and moving so I can get home."  [employer]: None Premorbid Home Care/DME Agencies: Other (Comment)([facility]) Transportation available at discharge: Yes Resource referrals recommended: Neuropsychology, Support group (specify)  Discharge Planning Living Arrangements: Spouse/significant other Support Systems: Spouse/significant other, Children, Other relatives, Friends/neighbors Type of Residence: Private residence Insurance Resources: Medicare, [place] (specify county[place]UHC Medicare; Medicaid [place]) Financial Resources: Radio broadcast assistant Screen Referred: No Living Expenses: Education officer, community Management: Patient, Significant Other Does the patient have any problems obtaining your medications?: No Home Management: Patient and fianc maintain their home independently Patient/Family Preliminary Plans: Patient to return home with fianc is primary support and additional support from family. Social Work Anticipated Follow Up Needs: HH/OP Expected length of stay: 18 to 21 days  Clinical Impression Pleasant gentleman here following a second stroke which he believes is related to his blood pressure and "high hernia".  Patient with admitted [facility] use and is reluctant to stop this as it assist with his pain management.  Patient lives with his fiance who can provide supervision to light physical assistance.  Local family members  can offer assistance intermittently.  Patient denies any significant emotional distress, however, will monitor as he may benefit from neuropsychology involvement.  Continue to  follow for discharge planning and support needs.  Maysa Lynn 07/28/2018, 4:05 PM

## 2018-07-28 NOTE — Consult Note (Addendum)
[facility] Consult: 11:59 AM 07/28/2018  LOS: 2 days    Referring Provider: Dr Naaman Plummer  Primary Care Physician:  Billie Ruddy, MD Primary Gastroenterologist:  Dr. Loletha Carrow    Reason for Consultation:  Anemia, FOBT +   HPI: [name] is a 68 y.o. male.  PMH hepatitis C, eradicated with 8 weeks  Mayvret 2018, undetectable virus x 3 in 2018.  Fibrosis score F3 by fibro-sure 2017.      Degenerative lumbar spine disease/stenosis.  CVA 2016.  Colitis in 2012, assumed to be ischemic. Bleeding peptic ulcers requiring surgery as a teenager.  Additional surgeries: 2001 ex lap with LOA for SBO, right inguinal hernia repair with mesh  2016, ruptured appendectomy 1969, partial prostatectomy 2011 (no cancer on path).   Recurrent iron def anemia, FOBT+/dark stools requiring blood transfusions dating back to at least 2007.     2007 colonoscopy per Dr Collene Mares,  poor prep. "Large amount of black debris, cold biopsies of 3 sessile polyps (hyperplastic) ?~ 2012 capsule endoscopy?, have not found results.  . 10/2015 EGD by Dr. Loletha Carrow for IDA, FOBT positive.  Other than medium sized hiatal hernia the exam was normal 10/2015 colonoscopy.  Internal hemorrhoids, other wise normal study. 12/2017 EGD.  LA grade B reflux esophagitis, 10 cm HH.  Friable mucosa within the herniated gastric cardia/fundus felt to be the likely source for chronic GI blood loss.  Gastric biopsy path: Reactive gastropathy with ulceration, chronic inflammation and foreign material.  No H. pylori, metaplasia, malignancy 12/2017 upper GI series: Moderate, sliding HH involving 1/3rd of stomach. Dr. Carlean Purl mentioned outpatient surgical consult for [facility] repair, but this never took place.  As of 12/2017 he was to take PPI twice daily and 1 month of 4 times daily  Carafate. Patient has been sporadically compliant with GI and primary care follow-up. Last GI visit in 02/2018 with Dr. Loletha Carrow.  Hgb improved at 10.4., not clear he was compliant with oral iron.  Again surgical repair of hiatal hernia entertained but Dr. Loletha Carrow wondered if it was feasible or wise given his multiple medical comorbidities and multiple previous surgeries.  Pt has not seen a Psychologist, sport and exercise.  [facility] says he missed an appt in 04/2018, pt says he was not aware of this.       06/24/2018 visit to the ED for acute swelling of the tongue which the ED physician felt might be due to lisinopril induced angioedema.  Admission was recommended but the patient left AMA. Patient admitted 07/20/2018 -07/26/2018 with intracerebral hemorrhage, hypertensive urgency. He displayed intermittent agitation and there was question of alcohol withdrawal requiring Precedex in the ICU.  His tox screen at admission was positive for [facility] stroke residual includes right greater than left sided weakness.  Discharged to [facility] inpatient rehab.  Patient was not started on any antiplatelet or anti-coagulant therapy but was receiving DVT prophylaxis Lovenox.  Medical staff has noted a steady decline in the patient's Hgb.  It was 9 at admission.  It has drifted to 6.9 today.  MCV low at 79.3.  Iron 18, ferritin 6, iron saturation 5%: all of these are low.  Stool FOBT positive but no melena or bloody stool.  No elevated BUN, AKI or CAD.  Patient admits that he ran out of oral iron, which he took once daily, at least 2 months ago. He has not been on PPI for he is not sure how long but at least several months.  At home was taking 2 Aleve 3-4 times a week for shoulder pain.  Admits to only drinking "table wine" or beer every few months.  At home he was experiencing heartburn a few times a week.  No dark stools or bloody stools.  Sister died with colon cancer in her early 78s.      Past Medical History:  Diagnosis Date  . Arthritis   . Cameron  ulcer, chronic 01/11/2018  . Chronic back pain   . GERD (gastroesophageal reflux disease)    uses Baking Soda  . GIB (gastrointestinal bleeding) 2012  . Glaucoma    right eye  . Headache    occasionally  . Hepatitis C   . Hiatal hernia with gastroesophageal reflux disease and esophagitis 01/05/2018  . History of blood transfusion    no abnormal reaction noted  . History of gout    doesn't take any meds  . Hyperlipidemia    not on any meds  . Hypertension    takes Amlodipine and Lisinopril daily  . Insomnia    takes Ambien nightly  . Ischemic colitis ([place]) 2012  . Joint pain   . Nocturia   . Numbness    both legs occasionally  . PONV (postoperative nausea and vomiting)   . Prostate cancer ([place])   . Shortness of breath dyspnea    rarely but when notices he can be lying/sitting/exertion.Dr.Hochrein is aware per pt  . Stroke ([place]) 2016   . Urinary frequency   . Urinary urgency     Past Surgical History:  Procedure Laterality Date  . APPENDECTOMY    . BIOPSY  01/11/2018   Procedure: BIOPSY;  Surgeon: Gatha Mayer, MD;  Location: [facility] ENDOSCOPY;  Service: Endoscopy;;  . COLONOSCOPY    . COLONOSCOPY N/A 10/26/2015   Procedure: COLONOSCOPY;  Surgeon: Doran Stabler, MD;  Location: [facility] ENDOSCOPY;  Service: Endoscopy;  Laterality: N/A;  . ESOPHAGOGASTRODUODENOSCOPY    . ESOPHAGOGASTRODUODENOSCOPY N/A 10/26/2015   Procedure: ESOPHAGOGASTRODUODENOSCOPY (EGD);  Surgeon: Doran Stabler, MD;  Location: [facility] ENDOSCOPY;  Service: Endoscopy;  Laterality: N/A;  . ESOPHAGOGASTRODUODENOSCOPY (EGD) WITH PROPOFOL N/A 01/11/2018   Procedure: ESOPHAGOGASTRODUODENOSCOPY (EGD) WITH PROPOFOL;  Surgeon: Gatha Mayer, MD;  Location: [facility] ENDOSCOPY;  Service: Endoscopy;  Laterality: N/A;  . INGUINAL HERNIA REPAIR Right 11/04/2014   Procedure: RIGHT INGUINAL HERNIA REPAIR WITH MESH;  Surgeon: Jackolyn Confer, MD;  Location: [facility];  Service: General;  Laterality: Right;  . MASS EXCISION N/A 11/04/2014    Procedure: REMOVAL OF RIGHT GROIN SOFT TISSUE MASS;  Surgeon: Jackolyn Confer, MD;  Location: [facility];  Service: General;  Laterality: N/A;  . Multiple abdominal surgeries     total of 13  . PROSTATECTOMY    . SMALL INTESTINE SURGERY      Prior to Admission medications   Medication Sig Start Date End Date Taking? Authorizing Provider  acetaminophen (TYLENOL) 325 MG tablet Take 1-2 tablets (325-650 mg total) by mouth every 4 (four) hours as needed for mild pain. 07/27/18   Love, Ivan Anchors, PA-C  amLODipine (NORVASC) 10 MG  tablet Take 1 tablet (10 mg total) by mouth daily. 10/18/17   [name], MD  DULoxetine (CYMBALTA) 30 MG capsule Take 1 capsule (30 mg total) by mouth 2 (two) times daily. 10/18/17   [name], MD  ferrous sulfate 325 (65 FE) MG tablet TAKE 1 TABLET BY MOUTH THREE TWICE A DAY WITH MEALS Patient taking differently: Take 325 mg by mouth 3 (three) times daily with meals.  03/30/18   Billie Ruddy, MD  HYDROcodone-acetaminophen (NORCO/VICODIN) 5-325 MG tablet Take 1 tablet by mouth every 6 (six) hours as needed for moderate pain. 12/01/17   [name], MD  lisinopril (PRINIVIL,ZESTRIL) 40 MG tablet Take 40 mg by mouth daily.    [provider]  Menthol-Methyl Salicylate (MUSCLE RUB) 10-15 % CREA Apply 1 application topically as needed for muscle pain. To left shoulder 07/27/18   Love, Ivan Anchors, PA-C  pantoprazole (PROTONIX) 40 MG tablet TAKE 1 TABLET BY MOUTH TWICE A DAY BEFORE A MEAL Patient taking differently: Take 40 mg by mouth 2 (two) times daily before a meal.  02/27/18   Billie Ruddy, MD  QUEtiapine (SEROQUEL) 25 MG tablet Take 0.5 tablets (12.5 mg total) by mouth 2 (two) times daily. 07/26/18   Donzetta Starch, NP  senna-docusate (SENOKOT-S) 8.6-50 MG tablet Take 1 tablet by mouth 2 (two) times daily. 07/26/18   Donzetta Starch, NP  traZODone (DESYREL) 100 MG tablet Take 1 tablet (100 mg total) by mouth at bedtime. 07/26/18   Donzetta Starch, NP     Scheduled Meds: . sodium chloride   Intravenous Once  . acetaminophen  650 mg Oral Once  . amLODipine  10 mg Oral Daily  . diphenhydrAMINE  25 mg Oral Once  . ferrous sulfate  325 mg Oral TID WC  . furosemide  20 mg Intravenous Once  . lisinopril  40 mg Oral Daily  . MUSCLE RUB   Topical BID  . pantoprazole  40 mg Oral BID AC  . QUEtiapine  12.5 mg Oral BID  . senna-docusate  1 tablet Oral BID  . traZODone  50 mg Oral QHS   Infusions:  PRN Meds: acetaminophen, alum & mag hydroxide-simeth, bisacodyl, cloNIDine, diphenhydrAMINE, guaiFENesin-dextromethorphan, polyethylene glycol, prochlorperazine **OR** prochlorperazine **OR** prochlorperazine, sodium phosphate, traZODone, zolpidem   Allergies as of 07/26/2018 - Review Complete 07/26/2018  Allergen Reaction Noted  . Penicillins Anaphylaxis 03/02/2011  . Sudafed [pseudoephedrine] Other (See Comments) 11/04/2014    Family History  Problem Relation Age of Onset  . Alzheimer's disease Father   . Cancer Mother        Lymph node  . Heart failure Brother 45       Transplant 13 years ago  . CAD Brother   . Heart disease Sister 37       MI    Social History   Socioeconomic History  . Marital status: Legally Separated    Spouse name: Not on file  . Number of children: 2  . Years of education: Not on file  . Highest education level: Not on file  Occupational History  . Not on file  Social Needs  . Financial resource strain: Not on file  . Food insecurity:    Worry: Not on file    Inability: Not on file  . Transportation needs:    Medical: Not on file    Non-medical: Not on file  Tobacco Use  . Smoking status: Current Every Day Smoker  Packs/day: 0.50    Years: 50.00    Pack years: 25.00    Types: Cigarettes  . Smokeless tobacco: Never Used  Substance and Sexual Activity  . Alcohol use: Yes    Alcohol/week: 0.0 standard drinks    Comment: occasionally  . Drug use: No    Frequency: 5.0 times per week  .  Sexual activity: Yes  Lifestyle  . Physical activity:    Days per week: Not on file    Minutes per session: Not on file  . Stress: Not on file  Relationships  . Social connections:    Talks on phone: Not on file    Gets together: Not on file    Attends religious service: Not on file    Active member of club or organization: Not on file    Attends meetings of clubs or organizations: Not on file    Relationship status: Not on file  . Intimate partner violence:    Fear of current or ex partner: Not on file    Emotionally abused: Not on file    Physically abused: Not on file    Forced sexual activity: Not on file  Other Topics Concern  . Not on file  Social History Narrative   One living child .  One murdered.  Lives with girlfriend.      REVIEW OF SYSTEMS: Constitutional: Prior to the stroke he was fully ambulatory, not having fatigue. ENT:  No nose bleeds.  Has dentures which do not fit so he does not use them. Pulm: No shortness of breath or cough. CV:  No palpitations, no LE edema.  No S pain GU:  No hematuria, no frequency GI: No dysphagia.  No abdominal pain. Heme: No excessive or unusual bleeding or bruising. Transfusions: Per HPI. Neuro: Left lower extremity weakness persists.  No headache, no seizure. Derm:  No itching, no rash or sores.  Endocrine:  No sweats or chills.  No polyuria or dysuria Immunization: vaccinated x 3 for Hep B completed 04/2017.   Travel:  None beyond local counties in last few months.    PHYSICAL EXAM: Vital signs in last 24 hours: Vitals:   07/27/18 2000 07/28/18 0510  BP: (!) 146/74 131/75  Pulse: 90 79  Resp:  19  Temp:  98.6 F (37 C)  SpO2:  100%   Wt Readings from Last 3 Encounters:  07/26/18 91.4 kg  07/24/18 89.2 kg  06/24/18 89.8 kg    General: Pleasant, calm, cooperative.  Patient looks unwell but not acutely ill Head: No facial asymmetry or swelling.  No signs of head trauma. Eyes: No scleral icterus.  No conjunctival  pallor.  EOMI. Ears: Not hard of hearing Nose: No congestion or discharge. Mouth: Tongue midline.  Oropharynx moist, clear, pink.  No teeth. Neck: No JVD, masses, thyromegaly. Lungs: Clear bilaterally.  No labored breathing or cough Heart: RRR with soft 1-2 /6 systolic murmur. Abdomen: Nontender, soft.  Well-healed, wide, long incisional scar to the right of midline..   Rectal: Deferred.  Nurse says the stool is dark consistent with iron intake.  It tested FOBT positive in the lab earlier today. Musc/Skeltl: No joint redness, swelling, gross deformity. Extremities: No CCE. Neurologic: Alert and oriented x3.  Fluid speech.  No obvious deficits but on limb strength testing he has 5/6 strength on hip flexion of the left leg, unable to form hip flexion of the right leg. Skin: No rashes, sores or suspicious lesions. Nodes: No cervical adenopathy Psych:  Pleasant, calm, cooperative.  LAB RESULTS: Recent Labs    07/26/18 0507 07/27/18 0553 07/28/18 0716  WBC 5.6 5.0 6.0  HGB 7.7* 7.0* 6.9*  HCT 27.8* 24.8* 24.1*  PLT 295 291 320   BMET Lab Results  Component Value Date   NA 138 07/27/2018   NA 140 07/26/2018   NA 141 07/25/2018   K 4.3 07/27/2018   K 4.1 07/26/2018   K 3.3 (L) 07/25/2018   CL 111 07/27/2018   CL 112 (H) 07/26/2018   CL 111 07/25/2018   CO2 21 (L) 07/27/2018   CO2 19 (L) 07/26/2018   CO2 21 (L) 07/25/2018   GLUCOSE 115 (H) 07/27/2018   GLUCOSE 114 (H) 07/26/2018   GLUCOSE 108 (H) 07/25/2018   BUN 21 07/27/2018   BUN 20 07/26/2018   BUN 16 07/25/2018   CREATININE 0.93 07/27/2018   CREATININE 0.96 07/26/2018   CREATININE 1.04 07/25/2018   CALCIUM 8.5 (L) 07/27/2018   CALCIUM 8.4 (L) 07/26/2018   CALCIUM 8.5 (L) 07/25/2018   LFT Recent Labs    07/27/18 0553  PROT 6.1*  ALBUMIN 2.9*  AST 17  ALT 19  ALKPHOS 70  BILITOT 0.5   PT/INR Lab Results  Component Value Date   INR 1.23 07/20/2018   INR 1.2 (H) 05/12/2016   INR 1.37 10/26/2015    Lipase     Component Value Date/Time   LIPASE 21 09/06/2014 1457    Drugs of Abuse     Component Value Date/Time   LABOPIA NONE DETECTED 07/20/2018 1638   COCAINSCRNUR NONE DETECTED 07/20/2018 1638   LABBENZ NONE DETECTED 07/20/2018 1638   AMPHETMU NONE DETECTED 07/20/2018 1638   THCU POSITIVE (A) 07/20/2018 1638   LABBARB NONE DETECTED 07/20/2018 1638     RADIOLOGY STUDIES: Dg Shoulder Left  Result Date: 07/27/2018 CLINICAL DATA:  Generalized left shoulder pain and limited range of motion after having a stroke this week. No reported injury. EXAM: LEFT SHOULDER - 2+ VIEW COMPARISON:  None. FINDINGS: No fracture or bone lesion. Glenohumeral AC joints are normally aligned. No significant degenerative/arthropathic change. Bones are demineralized. Surrounding soft tissues are unremarkable. IMPRESSION: No fracture, bone lesion or significant joint abnormality. Electronically Signed   By: Lajean Manes M.D.   On: 07/27/2018 13:59      IMPRESSION:   *   Recurrent of acute on chronic anemia.  FOBT positive. Source of blood loss and anemia attributed to Cameron's erosions within his hiatal hernia. It appears patient has not been compliant with taking oral iron and PPI as an outpatient.  *    CVA 2016.  Acute intracranial hemorrhage in setting of hypertensive emergency 07/20/2018  *    Eradicated hepatitis C.  Normal liver on CT 01/2017.  F3 fibrosis on Fibrosure in 2018. Platelets, LFTs, PT/INR normal in the last week.      PLAN:     *   Transfuse 2 units PRBCs as ordered.  *    Ordered Feraheme infusion for today and repeat in 1 week.  Dropping iron to 324 mg q day.  As oral iron causes dark stools and can cause false FOBT + test results, I cxld orders for further FOB testing,     *   Dr. Carlean Purl will be seeing the patient later, he can decide whether or not patient warrants EGD but this is not going to change overall management. In a few months, ? would he be candidate for  gastric fundoplication?  Azucena Freed  07/28/2018, 11:59 AM Phone [place] Attending   I have taken an interval history, reviewed the chart and examined the patient. I agree with the Advanced Practitioner's note, impression and recommendations.   He has chronic recurrent blood loss most likely from hiatal hernia and Cameron ulcers - there is no need to do endoscopic evaluation.  Feraheme x 2 and po iron 1 qd  He can f/u Dr. Loletha Carrow after dc but there may not be much else possible (poor surgical candidate).  He may need chronic parenteral iron I do not plan to follow him here - please call us if other ? arise  Gatha Mayer, MD, [facility] 07/28/2018 3:16 PM Pager [phone]

## 2018-07-28 NOTE — Progress Notes (Signed)
Physical Therapy Session Note  Patient Details  Name: [name] MRN: [id number] Date of Birth: 27-Dec-1950  Today's Date: 07/28/2018 PT Individual Time: 9024-0973 PT Individual Time Calculation (min): 28 min   Short Term Goals: Week 1:  PT Short Term Goal 1 (Week 1): Patient to demonstrate transfers consistently with mod A PT Short Term Goal 2 (Week 1): Patient to perform w/c management with S x 150'. PT Short Term Goal 3 (Week 1): Patient to ambulate with mod A x 50' PT Short Term Goal 4 (Week 1): Patient to demonstrate sitting balance during functional task with S  Skilled Therapeutic Interventions/Progress Updates:    Patient in bed s/p nursing assist for hygiene performed supine to sit through L sidelying with rail and min a for bringing R leg off bed.  Seated EOB S momentarily R hand bent and supporting on bed.  Transfer to w/c SPT with RW mod A for balance, lifting from elevated height and stepping to chair assist for walker management and cues for hand placement.  W/c management to nursing station 50' with bilateral UE's and increased time, deviation from straight path.  Assist rest of the way to gym (limited by shoulder pain.)  Stood for balance training with RW mod A and tossing horseshoes to target mod to min a for reaching and tossing with R UE decreased coordination and some ataxic movements noted.  Patient needed mod A and mod cues for L lateral weight shift to prevent R lateral LOB.  Widened BOS and still needed assist for balance with walker.  Seated in w/c propelled another 36' with cues and min a for direction.  Left in w/c per pt request with alarm belt and call bell in reach.   Therapy Documentation Precautions:  Precautions Precautions: Fall Precaution Comments: right hemiplegia, right lean Restrictions Weight Bearing Restrictions: No Pain: Pain Assessment Pain Scale: Faces Pain Score: 9  Faces Pain Scale: Hurts whole lot Pain Type: Acute pain Pain Location:  Shoulder Pain Orientation: Left Pain Descriptors / Indicators: Aching;Sharp Pain Frequency: Intermittent Pain Onset: On-going Patients Stated Pain Goal: 4 Pain Intervention(s): Repositioned;Ambulation/increased activity    Therapy/Group: Individual Therapy  Reginia Naas  [place], [place] 07/28/2018  07/28/2018, 12:14 PM

## 2018-07-28 NOTE — Significant Event (Signed)
CRITICAL VALUE ALERT  Critical Value:  Hgb  6.9  Date & Time Notied:  07/28/2018  0827  Provider Notified: Dr. Oval Linsey at [phone]  Orders Received/Actions taken: Perform rectal exam in order to get Occult stool sample to send to lab.

## 2018-07-28 NOTE — Progress Notes (Signed)
Occupational Therapy Session Note  Patient Details  Name: [name] MRN: [id number] Date of Birth: 29-Jun-1951  Today's Date: 07/28/2018 OT Individual Time: 1100-1200 OT Individual Time Calculation (min): 60 min    Short Term Goals: Week 1:  OT Short Term Goal 1 (Week 1): Pt will complete toilet transfer with mod A of 1 OT Short Term Goal 2 (Week 1): Pt will complete sit<>stand within BADL task with mod A OT Short Term Goal 3 (Week 1): Pt will use R UE for 50% of bathing tasks  Skilled Therapeutic Interventions/Progress Updates:    Pt greeted seated in wc and agreeable to OT treatment session. Pt reports chronic pain in L shoulder and feeling tired from PT this morning. Pt also with low hemoglobin and planning to be transfused today. Pt declined bathing/dressing this morning but requests to shave after returning from the gym. Pt brought down to gym in wc and completed 9 hold peg test. Pt needed verbal cues not to use L hand to assist R.  R: 1 minute 47 seconds L: 46 seconds Pt then worked on R hand fine motor coordination and problem solving with large peg board puzzle. Pt with poor frustration tolerance and needed verbal cues to slow down when placing pegs in holes. Pt returned to room for shaving task. Incorporated functional use of R UE to apply shaving cream with decreased smoothness/accuracy. Pt unsafe to shave with R UE 2/2 ataxia and unable to reach face with L hand 2/2 shoulder pain and weakness so OT and nursing staff assisted pt with shaving. Pt upset at lunch provided to pt and request to order new lunch tray. OT assisted with placing call to cafeteria, then pt able to hold phone to ear with L hand and hold menu in right to place order. Pt left with alarm belt on and needs met.   Therapy Documentation Precautions:  Precautions Precautions: Fall Precaution Comments: right hemiplegia, right lean Restrictions Weight Bearing Restrictions: No  Pain: Pain Assessment Pain  Scale: Faces Pain Score: 7  Faces Pain Scale: Hurts whole lot Pain Type: Acute pain Pain Location: Shoulder Pain Orientation: Left Pain Descriptors / Indicators: Aching;Sharp Pain Frequency: Intermittent Pain Onset: On-going Patients Stated Pain Goal: 4 Pain Intervention(s): Repositioned   Therapy/Group: Individual Therapy  Valma Cava 07/28/2018, 12:24 PM

## 2018-07-28 NOTE — IPOC Note (Signed)
Overall Plan of Care [facility]) Patient Details Name: Garland Hincapie MRN: [id number] DOB: 1951/06/12  Admitting Diagnosis: <principal problem not specified>  Hospital Problems: Active Problems:   Nontraumatic thalamic hemorrhage ([place])     Functional Problem List: Nursing Bladder, Behavior, Endurance, Medication Management, Nutrition, Bowel, Sensory, Safety, Skin Integrity  PT Balance, Endurance, Motor, Pain, Safety  OT Balance, Cognition, Endurance, Motor, Perception, Safety  SLP Safety, Perception, Cognition  TR         Basic ADL's: OT Eating, Grooming, Bathing, Dressing, Toileting     Advanced  ADL's: OT       Transfers: PT Bed Mobility, Bed to Chair, Car, [employer], Futures trader, Metallurgist: PT Ambulation, Stairs, Emergency planning/management officer     Additional Impairments: OT Fuctional Use of Upper Extremity  SLP Social Cognition, Communication expression Problem Solving, Memory, Attention, Awareness  TR      Anticipated Outcomes Item Anticipated Outcome  Self Feeding Mod I  Swallowing      Basic self-care  Supervision  Toileting  Supervision   Bathroom Transfers Supervision  Bowel/Bladder  Pt will manage bowel and bladder with min assist while in rehab.   Transfers  Supervision  Locomotion  Supervision  Communication  Supervision  Cognition  Supervision  Pain  Pt will manage pain at 3 or less on a scale of 0-10.   Safety/Judgment  Pt will follow safety precautions with min assist/cues while in rehab.    Therapy Plan: PT Intensity: Minimum of 1-2 x/day ,45 to 90 minutes PT Frequency: 5 out of 7 days PT Duration Estimated Length of Stay: 21 days OT Intensity: Minimum of 1-2 x/day, 45 to 90 minutes OT Frequency: 5 out of 7 days OT Duration/Estimated Length of Stay: 18-21 days SLP Intensity: Minumum of 1-2 x/day, 30 to 90 minutes SLP Frequency: 3 to 5 out of 7 days    Team Interventions: Nursing Interventions Patient/Family Education,  Bladder Management, Cognitive Remediation/Compensation, Discharge Planning, Disease Management/Prevention, Medication Management, Pain Management, Bowel Management, Skin Care/Wound Management  PT interventions Ambulation/gait training, Stair training, Training and development officer, Cognitive remediation/compensation, Therapeutic Exercise, Patient/family education, Therapeutic Activities, DME/adaptive equipment instruction, UE/LE Coordination activities, UE/LE Strength taining/ROM, Functional mobility training, Wheelchair propulsion/positioning  OT Interventions Training and development officer, Cognitive remediation/compensation, Academic librarian, Discharge planning, DME/adaptive equipment instruction, Functional mobility training, Neuromuscular re-education, Patient/family education, Self Care/advanced ADL retraining, Therapeutic Activities, Therapeutic Exercise, UE/LE Strength taining/ROM, UE/LE Coordination activities, Wheelchair propulsion/positioning  SLP Interventions Cognitive remediation/compensation, Internal/external aids, Cueing hierarchy, Therapeutic Activities, Functional tasks, Patient/family education  TR Interventions    SW/CM Interventions Discharge Planning, Psychosocial Support, Patient/Family Education   Barriers to Discharge MD  Medical stability  Nursing      PT      OT      SLP Other (comments)(history of drug and ETOH abuse) ETOH abuse likely has some impact on baseline ability to recall information  SW       Team Discharge Planning: Destination: PT-Home ,OT- Home , SLP-Home Projected Follow-up: PT-Home health PT, 24 hour supervision/assistance, OT-  Home health OT, SLP-24 hour supervision/assistance, Home Health SLP Projected Equipment Needs: PT-Rolling walker with 5" wheels, OT- To be determined, SLP-None recommended by SLP Equipment Details: PT- , OT-  Patient/family involved in discharge planning: PT- Patient,  OT-Patient, Family member/caregiver,  SLP-Patient  MD ELOS: 18-21 days Medical Rehab Prognosis:  Good Assessment: The patient has been admitted for CIR therapies with the diagnosis of right thalamic hemorrhage. The team will be addressing functional mobility,  strength, stamina, balance, safety, adaptive techniques and equipment, self-care, bowel and bladder mgt, patient and caregiver education, NMR, pain mgt, orthotics, education, family ed. Goals have been set at supervision for self-care/ADL's, mobility and cognition and communication.    Meredith Staggers, MD, FAAPMR      See Team Conference Notes for weekly updates to the plan of care

## 2018-07-29 ENCOUNTER — Inpatient Hospital Stay (HOSPITAL_COMMUNITY): Payer: Medicare Other | Admitting: Physical Therapy

## 2018-07-29 ENCOUNTER — Inpatient Hospital Stay (HOSPITAL_COMMUNITY): Payer: Medicare Other | Admitting: Occupational Therapy

## 2018-07-29 DIAGNOSIS — D649 Anemia, unspecified: Secondary | ICD-10-CM

## 2018-07-29 DIAGNOSIS — R52 Pain, unspecified: Secondary | ICD-10-CM

## 2018-07-29 LAB — TYPE AND SCREEN
ABO/RH(D): O POS
Antibody Screen: NEGATIVE
Unit division: 0
Unit division: 0

## 2018-07-29 LAB — BPAM RBC
Blood Product Expiration Date: 202002092359
Blood Product Expiration Date: 202002092359
ISSUE DATE / TIME: 202001101157
ISSUE DATE / TIME: 202001101549
Unit Type and Rh: 5100
Unit Type and Rh: 5100

## 2018-07-29 LAB — CBC
HCT: 27.6 % — ABNORMAL LOW (ref 39.0–52.0)
Hemoglobin: 8.1 g/dL — ABNORMAL LOW (ref 13.0–17.0)
MCH: 23.3 pg — ABNORMAL LOW (ref 26.0–34.0)
MCHC: 29.3 g/dL — ABNORMAL LOW (ref 30.0–36.0)
MCV: 79.5 fL — ABNORMAL LOW (ref 80.0–100.0)
Platelets: 307 10*3/uL (ref 150–400)
RBC: 3.47 MIL/uL — ABNORMAL LOW (ref 4.22–5.81)
RDW: 17.2 % — ABNORMAL HIGH (ref 11.5–15.5)
WBC: 6.8 10*3/uL (ref 4.0–10.5)
nRBC: 0 % (ref 0.0–0.2)

## 2018-07-29 NOTE — Progress Notes (Signed)
[facility] PHYSICAL MEDICINE & REHABILITATION PROGRESS NOTE   Subjective/Complaints: Patient seen laying in bed this morning.  He states he slept well overnight, confirmed with sleep chart.  He does not remember me.  He was seen by GI yesterday.  ROS: Denies CP, SOB, N/V/D  Objective:   Dg Shoulder Left  Result Date: 07/27/2018 CLINICAL DATA:  Generalized left shoulder pain and limited range of motion after having a stroke this week. No reported injury. EXAM: LEFT SHOULDER - 2+ VIEW COMPARISON:  None. FINDINGS: No fracture or bone lesion. Glenohumeral AC joints are normally aligned. No significant degenerative/arthropathic change. Bones are demineralized. Surrounding soft tissues are unremarkable. IMPRESSION: No fracture, bone lesion or significant joint abnormality. Electronically Signed   By: Lajean Manes M.D.   On: 07/27/2018 13:59   Recent Labs    07/28/18 0716 07/29/18 0548  WBC 6.0 6.8  HGB 6.9* 8.1*  HCT 24.1* 27.6*  PLT 320 307   Recent Labs    07/27/18 0553  NA 138  K 4.3  CL 111  CO2 21*  GLUCOSE 115*  BUN 21  CREATININE 0.93  CALCIUM 8.5*    Intake/Output Summary (Last 24 hours) at 07/29/2018 0801 Last data filed at 07/29/2018 0453 Gross per 24 hour  Intake 917.72 ml  Output 1275 ml  Net -357.28 ml     Physical Exam: Vital Signs Blood pressure (!) 152/89, pulse 73, temperature 98.8 F (37.1 C), temperature source Oral, resp. rate 18, height 5\' 9"  (1.753 m), weight 91.4 kg, SpO2 100 %.  Constitutional: No distress . Vital signs reviewed. HENT: Normocephalic.  Atraumatic. Eyes: EOMI. No discharge. Cardiovascular: RRR. No JVD. Respiratory: CTA Bilaterally. Normal effort. GI: BS +. Non-distended. Musc: left shoudler TTP.  No edema. Neurological: alert,  Dysarthria  Follows basic commands. Fair insight.   Motor: RUE: 4/5 proximal to distal LUE: 2/5 prox to 4-/5 distally (some pain inhibition) RLE: HF, KE 3/5, ADF 4/5 LLE: HF, KE 3 to 3+/5, ADF 4/5 ,  stable Skin: Skin is warm and dry.  Multiple healed lesions (bed bug bites) on bilateral shins  Psychiatric: flat.    Assessment/Plan: 1. Functional deficits secondary to left thalamic hemorrhage which require 3+ hours per day of interdisciplinary therapy in a comprehensive inpatient rehab setting.  Physiatrist is providing close team supervision and 24 hour management of active medical problems listed below.  Physiatrist and rehab team continue to assess barriers to discharge/monitor patient progress toward functional and medical goals  Care Tool:  Bathing    Body parts bathed by patient: Right arm, Left arm, Chest, Abdomen, Front perineal area, Right upper leg, Left upper leg, Face   Body parts bathed by helper: Buttocks, Right lower leg, Left lower leg     Bathing assist Assist Level: Moderate Assistance - Patient 50 - 74%     Upper Body Dressing/Undressing Upper body dressing   What is the patient wearing?: Pull over shirt    Upper body assist Assist Level: Minimal Assistance - Patient > 75%    Lower Body Dressing/Undressing Lower body dressing      What is the patient wearing?: Pants, Underwear/pull up     Lower body assist Assist for lower body dressing: Maximal Assistance - Patient 25 - 49%     Toileting Toileting Toileting Activity did not occur (Clothing management and hygiene only): N/A (no void or bm)  Toileting assist Assist for toileting: Minimal Assistance - Patient > 75%     Transfers Chair/bed transfer  Transfers assist     Chair/bed transfer assist level: Moderate Assistance - Patient 50 - 74%     Locomotion Ambulation   Ambulation assist      Assist level: Maximal Assistance - Patient 25 - 49% Assistive device: Walker-rolling Max distance: 15   Walk 10 feet activity   Assist     Assist level: Maximal Assistance - Patient 25 - 49%     Walk 50 feet activity   Assist Walk 50 feet with 2 turns activity did not occur:  Safety/medical concerns         Walk 150 feet activity   Assist Walk 150 feet activity did not occur: Safety/medical concerns         Walk 10 feet on uneven surface  activity   Assist Walk 10 feet on uneven surfaces activity did not occur: Safety/medical concerns         Wheelchair     Assist Will patient use wheelchair at discharge?: No Type of Wheelchair: Manual    Wheelchair assist level: Minimal Assistance - Patient > 75% Max wheelchair distance: 70    Wheelchair 50 feet with 2 turns activity    Assist    Wheelchair 50 feet with 2 turns activity did not occur: Safety/medical concerns       Wheelchair 150 feet activity     Assist Wheelchair 150 feet activity did not occur: Safety/medical concerns         Medical Problem List and Plan: 1.  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  -Continue  2.  DVT Prophylaxis/Anticoagulation: Pharmaceutical: Lovenox 3.  History of chronic back pain/headaches/pain Management: Tylenol as needed for now  -c/o significant left shoulder pain ("since first stroke"), suspect DJD/RTC/hemiplegic shoulder pain.    -shoulder xray fairly reviewed/fairly unremarkable   -schedule with muscle rub   -support left shoulder at rest, ROM with therapies, modaliteis   -consider steroid injection 4. Mood: LCSW to follow for evaluation and support 5. Neuropsych: This patient is not fully capable of making decisions on his own behalf. 6. Skin/Wound Care: Routine pressure relief measures 7. Fluids/Electrolytes/Nutrition: encourage PO 8.  HTN: Monitor blood pressures 4 times daily.  Continue amlodipine and lisinopril-- titrate medications to control.   ?  Trending up on 1/11  Monitor with increased mobility 9.  Acute on chronic blood loss anemia:   Hemoglobin 8.1 on 1/11 after transfusion on 1/10  Labs ordered for Monday  Hemoccult positive  -Resumed iron  supplement  -iron studies all c/w iron deficiency anemia  Appreciate GI notes, infusion ordered on 1/10 with repeat in 1 week with no other plan interventions.  Poor surgical candidate. 10. Chronic Insomnia/sleep state misperception:    Ttrazodone 100 mg nightly ineffective--resume home regimen of trazodone 50mg   and Ambien 5 mg (for now)  - sleep is fair at present  LOS: 3 days A FACE TO FACE EVALUATION WAS PERFORMED  Candi Profit Lorie Phenix 07/29/2018, 8:01 AM

## 2018-07-29 NOTE — Progress Notes (Signed)
Physical Therapy Session Note  Patient Details  Name: [name] MRN: [id number] Date of Birth: 1951-04-19  Today's Date: 07/29/2018 PT Individual Time: 3557-3220 PT Individual Time Calculation (min): 76 min   Short Term Goals: Week 1:  PT Short Term Goal 1 (Week 1): Patient to demonstrate transfers consistently with mod A PT Short Term Goal 2 (Week 1): Patient to perform w/c management with S x 150'. PT Short Term Goal 3 (Week 1): Patient to ambulate with mod A x 50' PT Short Term Goal 4 (Week 1): Patient to demonstrate sitting balance during functional task with S  Skilled Therapeutic Interventions/Progress Updates:  Pt was seen bedside in the am. Pt transferred supine to edge of bed with side rail, head of bed elevated and min A with verbal cues. Pt performed squat pivot transfer from edge of bed to w/c with mod A and verbal cues. Pt propelled w/c about 75 feet with B UEs and S with occasional verbal cues. Treatment focused on NMR, standing and ambulation in the parallel bars. Pt stood multiple times in parallel bars with min A and verbal cues. Pt ambulated forward/backward 6 feet x 2 with mod A and verbal cues in the parallel bars. [place] standing in parallel bars worked on upright posture, midline positioning and weight shifting. Pt also performed mini squats 3 sets x 10 reps each and marching 3 sets x 5 reps each. Pt propelled w/c about 75 feet with B UEs and S with occasional verbal cues. Pt returned to room and left sitting up in w/c with seat belt alarm in place and call bell within reach.   Therapy Documentation Precautions:  Precautions Precautions: Fall Precaution Comments: right hemiplegia, right lean Restrictions Weight Bearing Restrictions: No General:   Pain: Pain Assessment Pain Scale: 0-10 Pain Score: 0-No pain  Therapy/Group: Individual Therapy  Dub Amis 07/29/2018, 12:25 PM

## 2018-07-29 NOTE — Progress Notes (Signed)
Physical Therapy Session Note  Patient Details  Name: [name] MRN: [id number] Date of Birth: [birth year]  Today's Date: 07/29/2018   Short Term Goals: Week 1:  PT Short Term Goal 1 (Week 1): Patient to demonstrate transfers consistently with mod A PT Short Term Goal 2 (Week 1): Patient to perform w/c management with S x 150'. PT Short Term Goal 3 (Week 1): Patient to ambulate with mod A x 50' PT Short Term Goal 4 (Week 1): Patient to demonstrate sitting balance during functional task with S  Skilled Therapeutic Interventions/Progress Updates:   Pt received supine in bed, asleep. Pt aroused with effort by PT, and states that he just feel asleep and does not want to get out of bed. PT educated pt on benefit of continued physical activity of to improve overall function. Pt reports that he is too tired from earlier therapies to participate at this time. PT will rer-attempt at later time/date.         Therapy Documentation Precautions:  Precautions Precautions: Fall Precaution Comments: right hemiplegia, right lean Restrictions Weight Bearing Restrictions: No General: PT Amount of Missed Time (min): 60 Minutes PT Missed Treatment Reason: Patient fatigue;Patient unwilling to participate   Therapy/Group: Individual Therapy  Lorie Phenix 07/29/2018, 5:52 PM

## 2018-07-29 NOTE — Progress Notes (Signed)
Occupational Therapy Session Note  Patient Details  Name: [name] MRN: [id number] Date of Birth: 10/26/1950  Today's Date: 07/29/2018 OT Individual Time: 1100-1158 OT Individual Time Calculation (min): 58 min    Short Term Goals: Week 1:  OT Short Term Goal 1 (Week 1): Pt will complete toilet transfer with mod A of 1 OT Short Term Goal 2 (Week 1): Pt will complete sit<>stand within BADL task with mod A OT Short Term Goal 3 (Week 1): Pt will use R UE for 50% of bathing tasks  Skilled Therapeutic Interventions/Progress Updates:    Patient seated in w/c and ready for therapy session.  He denies pain at this time and requests to get washed up and dressed. Bathing:  Mod A for thoroughness bilateral lower legs and buttocks UB dressing:  Min A LB dressing:  Mod A, clothing mgmt:  Max A, min a/CG to maintain stance at bed rail Grooming:  Dependent for shaving, min A for combing hair  - note moderate dysmetria of right UE but consistent attempts to complete tasks with R side. Sit to stand: min/mod A - completed ROM activity in stance for 3 minutes with CG A Seated UE scapular ROM, forward reach, [place] activities, review of posture and positioning completed - A with Left UE proximal mobility due to residual deficits from prior CVA, adjusted back rest to promote upright posture in w/c Patient remained seated in w/c, seat belt alarm set, daughter present at close of sessionl  Therapy Documentation Precautions:  Precautions Precautions: Fall Precaution Comments: right hemiplegia, right lean Restrictions Weight Bearing Restrictions: No General:   Vital Signs:  Pain: Pain Assessment Pain Scale: 0-10 Pain Score: 0-No pain  Therapy/Group: Individual Therapy  Carlos Levering 07/29/2018, 12:02 PM

## 2018-07-30 ENCOUNTER — Inpatient Hospital Stay (HOSPITAL_COMMUNITY): Payer: Medicare Other | Admitting: Occupational Therapy

## 2018-07-30 MED ORDER — LIDOCAINE 5 % EX PTCH
1.0000 | MEDICATED_PATCH | CUTANEOUS | Status: DC
  Administered 2018-07-30 – 2018-08-07 (×9): 1 via TRANSDERMAL
  Filled 2018-07-30 (×10): qty 1

## 2018-07-30 MED ORDER — HYDRALAZINE HCL 10 MG PO TABS
10.0000 mg | ORAL_TABLET | Freq: Every day | ORAL | Status: DC
  Administered 2018-07-30 – 2018-08-17 (×19): 10 mg via ORAL
  Filled 2018-07-30 (×19): qty 1

## 2018-07-30 NOTE — Progress Notes (Signed)
Occupational Therapy Session Note  Patient Details  Name: [name] MRN: [id number] Date of Birth: Jun 01, 1951  Today's Date: 07/30/2018 OT Individual Time: 1300-1410 OT Individual Time Calculation (min): 70 min   Short Term Goals: Week 1:  OT Short Term Goal 1 (Week 1): Pt will complete toilet transfer with mod A of 1 OT Short Term Goal 2 (Week 1): Pt will complete sit<>stand within BADL task with mod A OT Short Term Goal 3 (Week 1): Pt will use R UE for 50% of bathing tasks  Skilled Therapeutic Interventions/Progress Updates:    Pt greeted in w/c. Declining participation in self care tasks. Agreeable to tx with encouragement. Escorted pt to dayroom and started with focus on standing balance. Mod A sit<stand at elevated table with Rt knee blocked x2 trials. Had pt play I-spy with OT while intermittently removing unilateral support from table to increase challenge. He required vcs and tactile cues to improve upright posture. Able to stand for 5 and 7 minute windows without rest! Transitioned to seated bimanual training/NMR via fastening large and small buttons, and also tying shoe lace knots. Pt able to meet task demands with increased time and encouragement. He also engaged in football throwing/catching for UE NMR. This was a past hobby of his, and pt smiled while participating. At end of session he was escorted back to room and left in w/c with all needs within reach and safety belt fastened.   Therapy Documentation Precautions:  Precautions Precautions: Fall Precaution Comments: right hemiplegia, right lean Restrictions Weight Bearing Restrictions: No Vital Signs: Therapy Vitals Temp: 97.9 F (36.6 C) Temp Source: Oral Pulse Rate: 79 Resp: 18 BP: 136/66 Patient Position (if appropriate): Lying Oxygen Therapy SpO2: 99 % O2 Device: Room Air Pain: No c/o pain during tx    ADL: ADL Eating: Set up Grooming: Setup Upper Body Bathing: Minimal assistance Lower Body  Bathing: Moderate assistance Upper Body Dressing: Minimal assistance Lower Body Dressing: Maximal assistance Toilet Transfer: Maximal assistance      Therapy/Group: Individual Therapy  Alta Goding A Taquisha Phung 07/30/2018, 4:02 PM

## 2018-07-30 NOTE — Progress Notes (Signed)
[place] PHYSICAL MEDICINE & REHABILITATION PROGRESS NOTE   Subjective/Complaints: Patient seen laying in bed this morning.  He states he slept well overnight.  He complains of left shoulder pain.  ROS: Denies CP, SOB, N/V/D  Objective:   No results found. Recent Labs    07/28/18 0716 07/29/18 0548  WBC 6.0 6.8  HGB 6.9* 8.1*  HCT 24.1* 27.6*  PLT 320 307   No results for input(s): NA, K, CL, CO2, GLUCOSE, BUN, CREATININE, CALCIUM in the last 72 hours.  Intake/Output Summary (Last 24 hours) at 07/30/2018 0727 Last data filed at 07/29/2018 2022 Gross per 24 hour  Intake 460 ml  Output 400 ml  Net 60 ml     Physical Exam: Vital Signs Blood pressure (!) 151/77, pulse 78, temperature 98.4 F (36.9 C), temperature source Oral, resp. rate 16, height 5\' 9"  (1.753 m), weight 91.4 kg, SpO2 97 %.  Constitutional: No distress . Vital signs reviewed. HENT: Normocephalic.  Atraumatic. Eyes: EOMI. No discharge. Cardiovascular: RRR.  No JVD. Respiratory: CTA bilaterally.  Normal effort. GI: BS +. Non-distended. Musc: left shoudler TTP, unchanged.  No edema. Neurological: Alert  Dysarthria  Follows basic commands. Fair insight.   Motor:  LUE: 2/5 prox to 4-/5 distally (some pain inhibition proximally) LLE: HF, KE 3 to 3+/5, ADF 4/5 , stable Skin: Skin is warm and dry.  Multiple healed lesions (bed bug bites) on bilateral shins  Psychiatric: flat.    Assessment/Plan: 1. Functional deficits secondary to left thalamic hemorrhage which require 3+ hours per day of interdisciplinary therapy in a comprehensive inpatient rehab setting.  Physiatrist is providing close team supervision and 24 hour management of active medical problems listed below.  Physiatrist and rehab team continue to assess barriers to discharge/monitor patient progress toward functional and medical goals  Care Tool:  Bathing    Body parts bathed by patient: Right arm, Left arm, Chest, Abdomen, Front  perineal area, Right upper leg, Left upper leg, Face   Body parts bathed by helper: Buttocks, Right lower leg, Left lower leg     Bathing assist Assist Level: Moderate Assistance - Patient 50 - 74%     Upper Body Dressing/Undressing Upper body dressing   What is the patient wearing?: Pull over shirt    Upper body assist Assist Level: Minimal Assistance - Patient > 75%    Lower Body Dressing/Undressing Lower body dressing      What is the patient wearing?: Pants, Incontinence brief     Lower body assist Assist for lower body dressing: Moderate Assistance - Patient 50 - 74%     Toileting Toileting Toileting Activity did not occur (Clothing management and hygiene only): N/A (no void or bm)  Toileting assist Assist for toileting: Moderate Assistance - Patient 50 - 74%     Transfers Chair/bed transfer  Transfers assist     Chair/bed transfer assist level: Moderate Assistance - Patient 50 - 74%     Locomotion Ambulation   Ambulation assist      Assist level: Minimal Assistance - Patient > 75% Assistive device: Parallel bars Max distance: 6   Walk 10 feet activity   Assist     Assist level: Maximal Assistance - Patient 25 - 49%     Walk 50 feet activity   Assist Walk 50 feet with 2 turns activity did not occur: Safety/medical concerns         Walk 150 feet activity   Assist Walk 150 feet activity did not occur:  Safety/medical concerns         Walk 10 feet on uneven surface  activity   Assist Walk 10 feet on uneven surfaces activity did not occur: Safety/medical concerns         Wheelchair     Assist Will patient use wheelchair at discharge?: No Type of Wheelchair: Manual    Wheelchair assist level: Supervision/Verbal cueing Max wheelchair distance: 75    Wheelchair 50 feet with 2 turns activity    Assist    Wheelchair 50 feet with 2 turns activity did not occur: Safety/medical concerns   Assist Level:  Supervision/Verbal cueing   Wheelchair 150 feet activity     Assist Wheelchair 150 feet activity did not occur: Safety/medical concerns         Medical Problem List and Plan: 1.  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  -Continue  2.  DVT Prophylaxis/Anticoagulation: Pharmaceutical: Lovenox 3.  History of chronic back pain/headaches/pain Management: Tylenol as needed for now  -c/o significant left shoulder pain ("since first stroke"), suspect DJD/RTC/hemiplegic shoulder pain.    -shoulder xray fairly reviewed/fairly unremarkable   -schedule with muscle rub   -support left shoulder at rest, ROM with therapies, modaliteis   Lidoderm patch started on 1/12   -consider steroid injection 4. Mood: LCSW to follow for evaluation and support 5. Neuropsych: This patient is not fully capable of making decisions on his own behalf. 6. Skin/Wound Care: Routine pressure relief measures 7. Fluids/Electrolytes/Nutrition: encourage PO 8.  HTN: Monitor blood pressures. Continue amlodipine and lisinopril-- titrate medications to control.   Hydralazine 10 nightly started on 1/12  Monitor with increased mobility 9.  Acute on chronic blood loss anemia:   Hemoglobin 8.1 on 1/11 after transfusion on 1/10  Labs ordered for tomorrow  Hemoccult positive  -Resumed iron supplement  -iron studies all c/w iron deficiency anemia  Appreciate GI notes, infusion ordered on 1/10 with repeat in 1 week with no other plan interventions.  Poor surgical candidate. 10. Chronic Insomnia/sleep state misperception:    Ttrazodone 100 mg nightly ineffective--resume home regimen of trazodone 50mg   and Ambien 5 mg (for now)  - sleep is fair at present  LOS: 4 days A FACE TO FACE EVALUATION WAS PERFORMED  Bracken Moffa Lorie Phenix 07/30/2018, 7:27 AM

## 2018-07-31 ENCOUNTER — Inpatient Hospital Stay (HOSPITAL_COMMUNITY): Payer: Medicare Other | Admitting: Speech Pathology

## 2018-07-31 ENCOUNTER — Inpatient Hospital Stay (HOSPITAL_COMMUNITY): Payer: Medicare Other | Admitting: Occupational Therapy

## 2018-07-31 ENCOUNTER — Inpatient Hospital Stay (HOSPITAL_COMMUNITY): Payer: Medicare Other | Admitting: Physical Therapy

## 2018-07-31 DIAGNOSIS — Z720 Tobacco use: Secondary | ICD-10-CM

## 2018-07-31 LAB — CBC WITH DIFFERENTIAL/PLATELET
Abs Immature Granulocytes: 0.05 10*3/uL (ref 0.00–0.07)
Basophils Absolute: 0 10*3/uL (ref 0.0–0.1)
Basophils Relative: 1 %
Eosinophils Absolute: 0.1 10*3/uL (ref 0.0–0.5)
Eosinophils Relative: 1 %
HCT: 25 % — ABNORMAL LOW (ref 39.0–52.0)
Hemoglobin: 7.5 g/dL — ABNORMAL LOW (ref 13.0–17.0)
Immature Granulocytes: 1 %
Lymphocytes Relative: 18 %
Lymphs Abs: 1.3 10*3/uL (ref 0.7–4.0)
MCH: 24.5 pg — ABNORMAL LOW (ref 26.0–34.0)
MCHC: 30 g/dL (ref 30.0–36.0)
MCV: 81.7 fL (ref 80.0–100.0)
Monocytes Absolute: 0.6 10*3/uL (ref 0.1–1.0)
Monocytes Relative: 9 %
Neutro Abs: 4.9 10*3/uL (ref 1.7–7.7)
Neutrophils Relative %: 70 %
Platelets: 363 10*3/uL (ref 150–400)
RBC: 3.06 MIL/uL — ABNORMAL LOW (ref 4.22–5.81)
RDW: 18.7 % — ABNORMAL HIGH (ref 11.5–15.5)
WBC: 7 10*3/uL (ref 4.0–10.5)
nRBC: 0 % (ref 0.0–0.2)

## 2018-07-31 LAB — BASIC METABOLIC PANEL
Anion gap: 6 (ref 5–15)
BUN: 12 mg/dL (ref 8–23)
CO2: 24 mmol/L (ref 22–32)
Calcium: 8.7 mg/dL — ABNORMAL LOW (ref 8.9–10.3)
Chloride: 109 mmol/L (ref 98–111)
Creatinine, Ser: 1.08 mg/dL (ref 0.61–1.24)
GFR calc Af Amer: >60 mL/min (ref >60)
GFR calc non Af Amer: >60 mL/min (ref >60)
Glucose, Bld: 104 mg/dL — ABNORMAL HIGH (ref 70–99)
Potassium: 3.9 mmol/L (ref 3.5–5.1)
Sodium: 139 mmol/L (ref 135–145)

## 2018-07-31 MED ORDER — BUPROPION HCL 75 MG PO TABS
75.0000 mg | ORAL_TABLET | Freq: Every day | ORAL | Status: DC
  Administered 2018-07-31 – 2018-08-11 (×12): 75 mg via ORAL
  Filled 2018-07-31 (×12): qty 1

## 2018-07-31 NOTE — Progress Notes (Signed)
Occupational Therapy Session Note  Patient Details  Name: [name] MRN: [id number] Date of Birth: [birth year]  Today's Date: 07/31/2018 Session 1 OT Individual Time: 1102-1117 OT Individual Time Calculation (min): 73 min   Session 2 OT Individual Time: 3567-0141 OT Individual Time Calculation (min): 28 min    Short Term Goals: Week 1:  OT Short Term Goal 1 (Week 1): Pt will complete toilet transfer with mod A of 1 OT Short Term Goal 2 (Week 1): Pt will complete sit<>stand within BADL task with mod A OT Short Term Goal 3 (Week 1): Pt will use R UE for 50% of bathing tasks  Skilled Therapeutic Interventions/Progress Updates:  Session 1   Pt greeted semi-reclined in bed and agreeable to treatment session. Pt declined bathing/dressing stating he had already washed up. Pt reports improved pain in L shoulder today with pain patch on. Encouraged pt to shower tomorrow- he stated he didn't know how we would do that, so OT explained how we would use tub bench to sit for shower and pt agreeable. Pt requests to use mouthwash at the sink with set-up A. Provided pt with built up foam handles for increased independence with self-feeding and demonstrated use to pt. Pt states he will try at lunch today. Worked on sit<>stand, standing balance/endurance, and L fine motor coordination with standing connect 4 activity using R hand to place chips in slots. Pt needed Mod and occasional max A to come to standing with facilitation for anterior weight shift and full hip extension. Pt with heavy use of table at times to use UEs to try to pull self into standing. Worked on isolating LEs to come to standing. Pt tolerated 6 standing bouts at 3-5 minutes on each stand with min A for balance. Pt then completed 5 more sit<>stands with pt needing max A at the end 2/2 fatigue. LB there ex- 3 sets of 10 seated hip extension, knee extension, and hip adduction. Pt returned to room at end of session and left seated in wc with  safety alarm belt on and needs met.  Pain: Pain Assessment Pain Scale: 0-10 Pain Score: 3  Pain Type: Chronic pain Pain Location: Shoulder Pain Orientation: Left Pain Descriptors / Indicators: Aching Pain Onset: On-going Pain Intervention(s): Repositioned   Session 2 Pt greeted semi-reclined in bed and agreeable to OT treatment session. Pt came to sitting EOB with min A. Mod A squat-pivot to wc on R side. Worked on R fine motor coordination and hand strength with theraputty activity. Pt able to use medium soft red theraputty. Worked on in-hand manipulation of putty to coordinate making a ball, rolling a log and functional pinch. Squat-pivot back to bed at end of session with min A to the L side and use of bed rails to assist with pivot. Assistance to bring R LE into bed. Pt left semi-reclined in bed with bed alarm on and needs met.   Therapy Documentation Precautions:  Precautions Precautions: Fall Precaution Comments: right hemiplegia, right lean Restrictions Weight Bearing Restrictions: No Pain: Pain Assessment Pain Scale: 0-10 Pain Score: 4  Pain Type: Chronic pain Pain Location: Shoulder Pain Orientation: Left Pain Descriptors / Indicators: Aching Pain Onset: On-going Pain Intervention(s): Repositioned  Therapy/Group: Individual Therapy  Valma Cava 07/31/2018, 3:56 PM

## 2018-07-31 NOTE — Progress Notes (Signed)
[facility] PHYSICAL MEDICINE & REHABILITATION PROGRESS NOTE   Subjective/Complaints: Pt up in bed. Seems to have had a good weekend. Left shoulder perhaps a little better with muscle rub. Wants something for smoking cessation.   ROS: Patient denies fever, rash, sore throat, blurred vision, nausea, vomiting, diarrhea, cough, shortness of breath or chest pain,  headache, or mood change.   Objective:   No results found. Recent Labs    07/29/18 0548  WBC 6.8  HGB 8.1*  HCT 27.6*  PLT 307   No results for input(s): NA, K, CL, CO2, GLUCOSE, BUN, CREATININE, CALCIUM in the last 72 hours.  Intake/Output Summary (Last 24 hours) at 07/31/2018 0736 Last data filed at 07/30/2018 1745 Gross per 24 hour  Intake 662 ml  Output -  Net 662 ml     Physical Exam: Vital Signs Blood pressure (!) 141/78, pulse 77, temperature 98.4 F (36.9 C), temperature source Oral, resp. rate 18, height 5\' 9"  (1.753 m), weight 91.4 kg, SpO2 97 %.  Constitutional: No distress . Vital signs reviewed. HEENT: EOMI, oral membranes moist Neck: supple Cardiovascular: RRR without murmur. No JVD    Respiratory: CTA Bilaterally without wheezes or rales. Normal effort    GI: BS +, non-tender, non-distended  Musc: left shoulder a little less tender with IR/ER/abduction. Neurological: Alert  Dysarthria  Follows basic commands. Fair insight.   Motor:  LUE: 2+/5 prox to 4-/5 distally with pain inhibition at shoulder LLE: HF, KE 3+/5, ADF 4/5 Skin: Skin is warm and dry.  Multiple healed lesions on shins Psychiatric: more engaging    Assessment/Plan: 1. Functional deficits secondary to left thalamic hemorrhage which require 3+ hours per day of interdisciplinary therapy in a comprehensive inpatient rehab setting.  Physiatrist is providing close team supervision and 24 hour management of active medical problems listed below.  Physiatrist and rehab team continue to assess barriers to discharge/monitor patient  progress toward functional and medical goals  Care Tool:  Bathing  Bathing activity did not occur: Refused Body parts bathed by patient: Right arm, Left arm, Chest, Abdomen, Front perineal area, Right upper leg, Left upper leg, Face   Body parts bathed by helper: Buttocks, Right lower leg, Left lower leg     Bathing assist Assist Level: Moderate Assistance - Patient 50 - 74%     Upper Body Dressing/Undressing Upper body dressing   What is the patient wearing?: Pull over shirt    Upper body assist Assist Level: Minimal Assistance - Patient > 75%    Lower Body Dressing/Undressing Lower body dressing      What is the patient wearing?: Pants, Incontinence brief     Lower body assist Assist for lower body dressing: Moderate Assistance - Patient 50 - 74%     Toileting Toileting Toileting Activity did not occur (Clothing management and hygiene only): N/A (no void or bm)  Toileting assist Assist for toileting: Moderate Assistance - Patient 50 - 74%     Transfers Chair/bed transfer  Transfers assist     Chair/bed transfer assist level: Moderate Assistance - Patient 50 - 74%     Locomotion Ambulation   Ambulation assist      Assist level: Minimal Assistance - Patient > 75% Assistive device: Parallel bars Max distance: 6   Walk 10 feet activity   Assist     Assist level: Maximal Assistance - Patient 25 - 49%     Walk 50 feet activity   Assist Walk 50 feet with 2 turns activity  did not occur: Safety/medical concerns         Walk 150 feet activity   Assist Walk 150 feet activity did not occur: Safety/medical concerns         Walk 10 feet on uneven surface  activity   Assist Walk 10 feet on uneven surfaces activity did not occur: Safety/medical concerns         Wheelchair     Assist Will patient use wheelchair at discharge?: No Type of Wheelchair: Manual    Wheelchair assist level: Supervision/Verbal cueing Max wheelchair  distance: 75    Wheelchair 50 feet with 2 turns activity    Assist    Wheelchair 50 feet with 2 turns activity did not occur: Safety/medical concerns   Assist Level: Supervision/Verbal cueing   Wheelchair 150 feet activity     Assist Wheelchair 150 feet activity did not occur: Safety/medical concerns         Medical Problem List and Plan: 1.  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  -Continue  2.  DVT Prophylaxis/Anticoagulation: Pharmaceutical: Lovenox 3.  History of chronic back pain/headaches/pain Management: Tylenol as needed for now  -c/o significant left shoulder pain ("since first stroke"), suspect DJD/RTC/hemiplegic shoulder pain.    -shoulder xray fairly reviewed/fairly unremarkable   -schedule with muscle rub   -support left shoulder at rest, ROM with therapies, modaliteis   -Lidoderm patch started on 1/12   -consider steroid injection this week 4. Mood: LCSW to follow for evaluation and support 5. Neuropsych: This patient is not fully capable of making decisions on his own behalf. 6. Skin/Wound Care: Routine pressure relief measures 7. Fluids/Electrolytes/Nutrition: encourage PO 8.  HTN: Monitor blood pressures. Continue amlodipine and lisinopril-- continue to titrate medications to control.   Hydralazine 10 nightly started on 1/12 with some improvement today    9.  Acute on chronic blood loss anemia/HH with gastritis:  Hemoglobin 8.1 on 1/11 after transfusion on 1/10  Labs pending for today  Hemoccult positive  -Resumed iron supplement  -iron studies all c/w iron deficiency anemia  Appreciate GI notes, infusion ordered on 1/10 with repeat in 1 week  -no other intervention recommended 10. Chronic Insomnia/sleep state misperception:    Ttrazodone 100 mg nightly ineffective--resume home regimen of trazodone 50mg   and Ambien 5 mg (for now)  - sleep chart indicates 6-8 hours of sleep most  nights 11. Tobacco dependence:  -not a nicotine patch candidate  -will begin a trial of wellbutrin 75mg  daily to start    LOS: 5 days A FACE TO FACE EVALUATION WAS PERFORMED  Meredith Staggers 07/31/2018, 7:36 AM

## 2018-07-31 NOTE — Progress Notes (Signed)
Physical Therapy Session Note  Patient Details  Name: [name] MRN: [id number] Date of Birth: 17-Mar-1951  Today's Date: 07/31/2018 PT Individual Time: 1240-1312 PT Individual Time Calculation (min): 32 min   Short Term Goals: Week 1:  PT Short Term Goal 1 (Week 1): Patient to demonstrate transfers consistently with mod A PT Short Term Goal 2 (Week 1): Patient to perform w/c management with S x 150'. PT Short Term Goal 3 (Week 1): Patient to ambulate with mod A x 50' PT Short Term Goal 4 (Week 1): Patient to demonstrate sitting balance during functional task with S  Skilled Therapeutic Interventions/Progress Updates:   pt performs bed mobility with supervision, increased time. Pt performs squat pivot transfers throughout session with mod A, manual facilitation for wt shifts.  Pt performs gait 2 x 25' with RW and min A, improving wt shifts and decreased foot drag on Rt noted.  Standing balance and wt shifts with focus on trunk extension, postural control and Rt LE strength and stability with min/mod A for wt shifts and squats, max A for tap ups with Lt LE to 2'' step.  Pt performs toilet transfer with use of grab bar with min A. Left on toilet with RN present.  Therapy Documentation Precautions:  Precautions Precautions: Fall Precaution Comments: right hemiplegia, right lean Restrictions Weight Bearing Restrictions: No Pain:  pt c/o Lt shoulder pain throughout session, medication patch applied prior to session, repositioned as needed   Therapy/Group: Individual Therapy  Annet Manukyan 07/31/2018, 1:14 PM

## 2018-07-31 NOTE — Progress Notes (Signed)
Speech Language Pathology Daily Session Note  Patient Details  Name: [name] MRN: [id number] Date of Birth: [birth year]  Today's Date: 07/31/2018 SLP Individual Time: 0730-0825 SLP Individual Time Calculation (min): 55 min  Short Term Goals: Week 1: SLP Short Term Goal 1 (Week 1): Pt will complete semi-complex problem solving tasks with Min A cues.  SLP Short Term Goal 2 (Week 1): Pt will self-monitor and self-correct errors with Min A cues.  SLP Short Term Goal 3 (Week 1): Pt will demonstrate selective attention in moderately distracting environment for ~ 30 minutes with supervision cues.  SLP Short Term Goal 4 (Week 1): Pt will utilize speech intelligibility strategies with Min A cues to achieve speech intelligibility to > 90% at the sentence level.  SLP Short Term Goal 5 (Week 1): Pt will compensatory memory aids to recall information related to dialy schedule and activities with Min A cues.   Skilled Therapeutic Interventions: Skilled treatment session focused on cognitive goals. SLP facilitated session by providing Mod A verbal cues for problem solving and safety while donning pants and transfer to the wheelchair via the [place]. SLP also facilitated session by providing total A verbal cues for recall of his medications and their current functions as well as overall Mod A verbal and visual cues for problem solving while organizing a BID pill box. Patient left upright in wheelchair with alarm on and girlfriend present. Continue with current plan of care.      Pain Pain Assessment Pain Scale: 0-10 Pain Score: 0-No pain Faces Pain Scale: No hurt  Therapy/Group: Individual Therapy  Jamile Sivils 07/31/2018, [place], [place], [place]

## 2018-08-01 ENCOUNTER — Inpatient Hospital Stay (HOSPITAL_COMMUNITY): Payer: Medicare Other | Admitting: Physical Therapy

## 2018-08-01 ENCOUNTER — Inpatient Hospital Stay (HOSPITAL_COMMUNITY): Payer: Medicare Other | Admitting: Occupational Therapy

## 2018-08-01 ENCOUNTER — Inpatient Hospital Stay (HOSPITAL_COMMUNITY): Payer: Medicare Other | Admitting: Speech Pathology

## 2018-08-01 MED ORDER — QUETIAPINE FUMARATE 50 MG PO TABS
50.0000 mg | ORAL_TABLET | Freq: Every day | ORAL | Status: DC
  Administered 2018-08-01: 50 mg via ORAL
  Filled 2018-08-01: qty 1

## 2018-08-01 NOTE — Care Management (Signed)
[facility] Individual Statement of Services  Patient Name:  [name]  Date:  08/01/2018  Welcome to the [facility].  Our goal is to provide you with an individualized program based on your diagnosis and situation, designed to meet your specific needs.  With this comprehensive rehabilitation program, you will be expected to participate in at least 3 hours of rehabilitation therapies Monday-Friday, with modified therapy programming on the weekends.  Your rehabilitation program will include the following services:  Physical Therapy (PT), Occupational Therapy (OT), Speech Therapy (ST), 24 hour per day rehabilitation nursing, Therapeutic Recreaction (TR), Neuropsychology, Case Management (Social Worker), Rehabilitation Medicine, Nutrition Services and Pharmacy Services  Weekly team conferences will be held on Tuesdays to discuss your progress.  Your Social Worker will talk with you frequently to get your input and to update you on team discussions.  Team conferences with you and your family in attendance may also be held.  Expected length of stay: 18-21 days   Overall anticipated outcome: supervision  Depending on your progress and recovery, your program may change. Your Social Worker will coordinate services and will keep you informed of any changes. Your Social Worker's name and contact numbers are listed  below.  The following services may also be recommended but are not provided by the [facility] will be made to provide these services after discharge if needed.  Arrangements include referral to agencies that provide these services.  Your insurance has been verified to be:  [facility] Medicare; Medicaid Your primary doctor is:  [name]  Pertinent information will be shared with your doctor and your insurance  company.  Social Worker:  [place], [place] or ([phone]   Information discussed with and copy given to patient by: Lennart Pall, 08/01/2018, 12:08 PM

## 2018-08-01 NOTE — Progress Notes (Signed)
[facility] PHYSICAL MEDICINE & REHABILITATION PROGRESS NOTE   Subjective/Complaints: Patient upset that we are not doing anything about his sleep.  Nurse reports finding sleeping supplement on patient's person.  Apparently it was brought into him by daughter.  States he was up all night watching television.  ROS: Patient denies fever, rash, sore throat, blurred vision, nausea, vomiting, diarrhea, cough, shortness of breath or chest pain,   Headache   Objective:   No results found. Recent Labs    07/31/18 0718  WBC 7.0  HGB 7.5*  HCT 25.0*  PLT 363   Recent Labs    07/31/18 0718  NA 139  K 3.9  CL 109  CO2 24  GLUCOSE 104*  BUN 12  CREATININE 1.08  CALCIUM 8.7*    Intake/Output Summary (Last 24 hours) at 08/01/2018 0849 Last data filed at 07/31/2018 1840 Gross per 24 hour  Intake 702 ml  Output 250 ml  Net 452 ml     Physical Exam: Vital Signs Blood pressure 139/65, pulse 68, temperature 98.8 F (37.1 C), temperature source Oral, resp. rate 17, height 5\' 9"  (1.753 m), weight 91.4 kg, SpO2 99 %.  Constitutional: No distress . Vital signs reviewed. HEENT: EOMI, oral membranes moist Neck: supple Cardiovascular: RRR without murmur. No JVD    Respiratory: CTA Bilaterally without wheezes or rales. Normal effort    GI: BS +, non-tender, non-distended  Musc: Left shoulder tender with external and internal rotation Neurological: Alert  Dysarthria  Follows basic commands. Fair insight.   Motor:  LUE: 2+/5 prox to 4-/5 distally with pain inhibition at shoulder--no significant changes LLE: HF, KE 3+/5, ADF 4/5 Skin: Skin is warm and dry.  Multiple healed lesions on shins Psychiatric: Very irritable   Assessment/Plan: 1. Functional deficits secondary to left thalamic hemorrhage which require 3+ hours per day of interdisciplinary therapy in a comprehensive inpatient rehab setting.  Physiatrist is providing close team supervision and 24 hour management of active  medical problems listed below.  Physiatrist and rehab team continue to assess barriers to discharge/monitor patient progress toward functional and medical goals  Care Tool:  Bathing  Bathing activity did not occur: Refused Body parts bathed by patient: Right arm, Left arm, Chest, Abdomen, Front perineal area, Right upper leg, Left upper leg, Face   Body parts bathed by helper: Buttocks, Right lower leg, Left lower leg     Bathing assist Assist Level: Moderate Assistance - Patient 50 - 74%     Upper Body Dressing/Undressing Upper body dressing Upper body dressing/undressing activity did not occur (including orthotics): Refused What is the patient wearing?: Pull over shirt    Upper body assist Assist Level: Minimal Assistance - Patient > 75%    Lower Body Dressing/Undressing Lower body dressing    Lower body dressing activity did not occur: Refused What is the patient wearing?: Pants, Incontinence brief     Lower body assist Assist for lower body dressing: Moderate Assistance - Patient 50 - 74%     Toileting Toileting Toileting Activity did not occur (Clothing management and hygiene only): N/A (no void or bm)  Toileting assist Assist for toileting: Moderate Assistance - Patient 50 - 74%     Transfers Chair/bed transfer  Transfers assist     Chair/bed transfer assist level: Moderate Assistance - Patient 50 - 74%     Locomotion Ambulation   Ambulation assist      Assist level: Moderate Assistance - Patient 50 - 74% Assistive device: Walker-rolling Max distance:  25   Walk 10 feet activity   Assist     Assist level: Moderate Assistance - Patient - 50 - 74%     Walk 50 feet activity   Assist Walk 50 feet with 2 turns activity did not occur: Safety/medical concerns         Walk 150 feet activity   Assist Walk 150 feet activity did not occur: Safety/medical concerns         Walk 10 feet on uneven surface  activity   Assist Walk 10 feet  on uneven surfaces activity did not occur: Safety/medical concerns         Wheelchair     Assist Will patient use wheelchair at discharge?: No Type of Wheelchair: Manual    Wheelchair assist level: Supervision/Verbal cueing Max wheelchair distance: 75    Wheelchair 50 feet with 2 turns activity    Assist    Wheelchair 50 feet with 2 turns activity did not occur: Safety/medical concerns   Assist Level: Supervision/Verbal cueing   Wheelchair 150 feet activity     Assist Wheelchair 150 feet activity did not occur: Safety/medical concerns         Medical Problem List and Plan: 1.  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  -Interdisciplinary Team Conference today   2.  DVT Prophylaxis/Anticoagulation: Pharmaceutical: Lovenox 3.  History of chronic back pain/headaches/pain Management: Tylenol as needed for now  -c/o significant left shoulder pain ("since first stroke"), suspect DJD/RTC/hemiplegic shoulder pain.    -shoulder xray fairly reviewed/fairly unremarkable   -schedule with muscle rub   -support left shoulder at rest, ROM with therapies, modaliteis   -Lidoderm patch started on 1/12   -consider steroid injection this week 4. Mood: LCSW to follow for evaluation and support 5. Neuropsych: This patient is not fully capable of making decisions on his own behalf. 6. Skin/Wound Care: Routine pressure relief measures 7. Fluids/Electrolytes/Nutrition: encourage PO 8.  HTN: Monitor blood pressures. Continue amlodipine and lisinopril-- continue to titrate medications to control.   Hydralazine 10 nightly started on 1/12 with some improvement today    9.  Acute on chronic blood loss anemia/HH with gastritis:  Hemoglobin drifting down again - 7.5 1/13--->follow up Wednesday  Labs pending for today  Hemoccult positive  -Resumed iron supplement  -iron studies all c/w iron deficiency anemia  Appreciate GI  notes, infusion ordered on 1/10 with repeat in 1 week  -no other intervention recommended 10. Chronic Insomnia/sleep state misperception:    Ttrazodone 100 mg nightly was ineffective--resumed home regimen of trazodone 50mg   and Ambien 5 mg--->pharmacy is limiting him to only 5 mg due to his age.  However patient uses 10 mg at home.  -We will add Seroquel tonight, 50 mg nightly  -dc trazodone.  -may continue ambien    11. Tobacco dependence:  -not a nicotine patch candidate  -will begin a trial of wellbutrin 75mg  daily to start    LOS: 6 days A FACE TO FACE EVALUATION WAS PERFORMED  Meredith Staggers 08/01/2018, 8:49 AM

## 2018-08-01 NOTE — Progress Notes (Signed)
Speech Language Pathology Daily Session Note  Patient Details  Name: [name] MRN: [id number] Date of Birth: 28-Jun-1951  Today's Date: 08/01/2018 SLP Individual Time: 6294-7654 SLP Individual Time Calculation (min): 55 min  Short Term Goals: Week 1: SLP Short Term Goal 1 (Week 1): Pt will complete semi-complex problem solving tasks with Min A cues.  SLP Short Term Goal 2 (Week 1): Pt will self-monitor and self-correct errors with Min A cues.  SLP Short Term Goal 3 (Week 1): Pt will demonstrate selective attention in moderately distracting environment for ~ 30 minutes with supervision cues.  SLP Short Term Goal 4 (Week 1): Pt will utilize speech intelligibility strategies with Min A cues to achieve speech intelligibility to > 90% at the sentence level.  SLP Short Term Goal 5 (Week 1): Pt will compensatory memory aids to recall information related to dialy schedule and activities with Min A cues.   Skilled Therapeutic Interventions: Skilled treatment session focused on cognitive goals. SLP facilitated session by providing extra time for problem solving during a basic money management task. Patient also performed functional  reading tasks with overall supervision level verbal cues. Patient demonstrated selective attention to tasks in a minimally distracting enviornment for ~45 minutes with Min A verbal cues for redirection.Patient was ~90% intelligible throughout session at the sentence level and required Min A verbal cues for use of speech intelligibility strategies. Patient left upright in wheelchair with alarm on and all needs within reach. Continue with current plan of care.      Pain No/Denies Pain   Therapy/Group: Individual Therapy  Trysten Berti, [place] 08/01/2018, 3:21 PM

## 2018-08-01 NOTE — Progress Notes (Signed)
While transferring patient to the chair this AM, RN found sleeping supplement with patient. When asked patient said his daughter brought it for him and that he will not give it the nurse because it makes him sleep. RN educated patient about home med policy but patient said he doesn't want to hear anything about his med. We continue to monitor.

## 2018-08-01 NOTE — Progress Notes (Signed)
Occupational Therapy Session Note  Patient Details  Name: [name] MRN: [id number] Date of Birth: July 24, 1950  Today's Date: 08/01/2018 OT Individual Time: 1030-1130 OT Individual Time Calculation (min): 60 min   Short Term Goals: Week 1:  OT Short Term Goal 1 (Week 1): Pt will complete toilet transfer with mod A of 1 OT Short Term Goal 2 (Week 1): Pt will complete sit<>stand within BADL task with mod A OT Short Term Goal 3 (Week 1): Pt will use R UE for 50% of bathing tasks  Skilled Therapeutic Interventions/Progress Updates:    Pt greeted semi-reclined in bed and agreeable to OT treatment session. Pt declined bathing/dressing despite max encouragement from OT. Pt stated he had already washed up today, which he had not done. Pt had changed clothes 2/2 incontinent episode earlier, but continued to decline BADLs. Pt agreeable to practice stand-pivot in and out of shower with min/mod A and heavy use of grab bars. Pt stated he felt more comfortable with showering now. Hopefully he will be agreeable tomorrow. Pt completed grooming task at the sink of mouth wash and face fash with set-up A. Worked on standing balance/endurance, and R NMR with standing thera-putty activity. Mod A to come into standing with facilitation for full hip/trunk extension. Weight bearing through R UE for neuro re-ed to mash putty with heel of hand. Incoporated reaching for balance challenge. While standing, pt reported episode of incontinence in brief. Pt returned to room in wc for time management. Sit<>stand at the sink with mod A to achieve standing, then min A for balance while pt washed peri-area after urinary incontinence. New brief donned and pt worked on forced use of R UE to assist with pulling up pants. Pt left seated in wc at end of session with alarm belt on and needs met.   Therapy Documentation Precautions:  Precautions Precautions: Fall Precaution Comments: right hemiplegia, right  lean Restrictions Weight Bearing Restrictions: No Pain: Pain Assessment Pain Scale: 0-10 Pain Score: 6  Pain Type: Chronic pain Pain Location: Shoulder Pain Orientation: Left Pain Descriptors / Indicators: Aching Pain Frequency: Constant Pain Onset: On-going Patients Stated Pain Goal: 3 Pain Intervention(s): Repositioned  Therapy/Group: Individual Therapy  Valma Cava 08/01/2018, 10:57 AM

## 2018-08-01 NOTE — Patient Care Conference (Signed)
Inpatient RehabilitationTeam Conference and Plan of Care Update Date: 08/01/2018   Time: 3:10 PM    Patient Name: [name]      Medical Record Number: [id number]  Date of Birth: [birth year] Sex: Male         Room/Bed: 4W11C/4W11C-01 Payor Info: Payor: [employer] MEDICARE / Plan: [employer] MEDICARE / Product Type: *No Product type* /    Admitting Diagnosis: L thalamus ich  Admit Date/Time:  07/26/2018  5:42 PM Admission Comments: No comment available   Primary Diagnosis:  <principal problem not specified> Principal Problem: <principal problem not specified>  Patient Active Problem List   Diagnosis Date Noted  . Acute on chronic anemia   . Pain   . Agitation 07/26/2018  . Hepatitis C 07/26/2018  . Nontraumatic thalamic hemorrhage ([place]) 07/26/2018  . ICH (intracerebral hemorrhage) ([place]) 07/20/2018  . Osteoarthritis of left hip 04/06/2018  . Cameron ulcer, chronic 01/11/2018  . Microcytic anemia   . Symptomatic anemia 01/10/2018  . Syncope 01/10/2018  . Hiatal hernia with gastroesophageal reflux disease and esophagitis 01/05/2018  . Cigarette nicotine dependence without complication 01/77/9390  . Insomnia 01/05/2018  . Acute pain of left shoulder 11/21/2017  . Anemia, iron deficiency   . Gastrointestinal bleeding 10/24/2015  . Absolute anemia   . Tobacco abuse   . Acute ischemic stroke ([place])   . Acute CVA (cerebrovascular accident) ([address]) 05/31/2015  . Stroke ([place]) 05/31/2015  . Dyspnea 04/10/2014  . HTN (hypertension) 04/10/2014  . Inguinal hernia without mention of obstruction or gangrene, unilateral or unspecified, (not specified as recurrent)-right 01/30/2014    Expected Discharge Date: Expected Discharge Date: 08/18/18  Team Members Present: Physician leading conference: Dr. Alger Simons Social Worker Present: Lennart Pall, LCSW Nurse Present: Dorthula Nettles, RN PT Present: Michaelene Song, PT SLP Present: Weston Anna, SLP PPS Coordinator present :  Gunnar Fusi     Current Status/Progress Goal Weekly Team Focus  Medical   Patient admitted on July 26, 2018 with left thalamic hemorrhage.  Patient with ongoing issues regarding left shoulder pain which is chronic.  Suspect bursitis/adhesive capsulitis and rotator cuff tendinitis.  Also with chronic insomnia which has been impactful.  Proved sleep cycle and pain.  Stabilized medically for discharge  Normalizing sleep cycle, GI assessment and work-up, blood transfusion and anemia management as needed   Bowel/Bladder   contientn of bowel/bladder. LBM 07/31/18  Remain continent of bowel/bladder with min assist  Assess bowel/bladder function q shift and as needed   Swallow/Nutrition/ Hydration             ADL's   Mod A functional transfers, Mod/max A LB ADLs, Min A UB ADLs  Supervision overall  R NMR, UB strengthening, modified bathing/dressing, improved sit<>stand, standing balance/endurance   Mobility   min/mod A transfers and gait with RW  supervision transfers, min A gait  NMR, gait, balance   Communication   Supervision-Min A   Supervision  use of intelligibility strategies    Safety/Cognition/ Behavioral Observations  Min A  Supervision   complex problem solving, recall with use of strategies, attention, awareness    Pain   No complain of pain  <2  Assess and treat pain q shift and as needed   Skin   No skin issue noted  Maintain skin integrity  Assess skin q shift and as neede    Rehab Goals Patient on target to meet rehab goals: Yes *See Care Plan and progress notes for long and short-term goals.  Barriers to Discharge  Current Status/Progress Possible Resolutions Date Resolved   Physician    Medical stability        Medical management as above.  Patient buy-in regarding regimen for pain and sleep.      Nursing                  PT                    OT                  SLP                SW                Discharge Planning/Teaching Needs:  Plan to d/c  home with fiance who can provide 24/7 assistance.  Teaching to be planned closer to d/c.   Team Discussion:  Chronic pain is limiting;  Irritability and insomnia are issues.  MD to add sleep aid and monitor sleep chart.  Mostly cont b/b but has had a couple of incont episodes.  Making good progress with cognition and baseline personality might be showing.  Pt has been refusing to shower but OT hopeful he will tomorrow.  Min/ mod assist ambulation ~ 25' and same level with ADLS.  Most goals set for supervision/ min assist.  Revisions to Treatment Plan:  NA    Continued Need for Acute Rehabilitation Level of Care: The patient requires daily medical management by a physician with specialized training in physical medicine and rehabilitation for the following conditions: Daily direction of a multidisciplinary physical rehabilitation program to ensure safe treatment while eliciting the highest outcome that is of practical value to the patient.: Yes Daily medical management of patient stability for increased activity during participation in an intensive rehabilitation regime.: Yes Daily analysis of laboratory values and/or radiology reports with any subsequent need for medication adjustment of medical intervention for : Neurological problems;Mood/behavior problems;Other   I attest that I was present, lead the team conference, and concur with the assessment and plan of the team.   Kacey Vicuna 08/01/2018, 5:21 PM

## 2018-08-01 NOTE — Progress Notes (Signed)
Physical Therapy Session Note  Patient Details  Name: [name] MRN: [id number] Date of Birth: Nov 02, 1950  Today's Date: 08/01/2018 PT Individual Time: 0800-0913 PT Individual Time Calculation (min): 73 min   Short Term Goals: Week 1:  PT Short Term Goal 1 (Week 1): Patient to demonstrate transfers consistently with mod A PT Short Term Goal 2 (Week 1): Patient to perform w/c management with S x 150'. PT Short Term Goal 3 (Week 1): Patient to ambulate with mod A x 50' PT Short Term Goal 4 (Week 1): Patient to demonstrate sitting balance during functional task with S  Skilled Therapeutic Interventions/Progress Updates:    pt in w/c agreeable to therapy. Pt continues to c/o Lt shoulder pain, meds given during session.  Pt performs squat pivot transfers and sit <> stand transfers with min A throughout session with facilitation for wt shifts and tactile cuing for Rt LE and trunk control.  Standing balance with reaching task with focus on Rt LE activation and wt shifting with min/mod A.  Sit <> stand with adductor squeeze with min/mod A for anterior wt shift and midline posture.  Seated LAQ with adductor squeeze with improving coordination with repetition of task.  Blocked practice of gait training with RW x 25' with pt progressing to min A for gait during session.  Seated UE coordination exercises with HEP and also with clothespin task as pt c/o difficulty self feeding with Rt UE.  Pt given handout to practice in room.  Pt incontinent of bladder.  Performs standing balance with min/mod A with total A for hygiene and clothing management. Pt left in w/c with alarm set, needs at hand.  Therapy Documentation Precautions:  Precautions Precautions: Fall Precaution Comments: right hemiplegia, right lean Restrictions Weight Bearing Restrictions: No Pain: Pain Assessment Pain Scale: 0-10 Pain Score: 6  Pain Type: Chronic pain Pain Location: Shoulder Pain Orientation: Left Pain Descriptors /  Indicators: Aching Pain Frequency: Constant Pain Onset: On-going Patients Stated Pain Goal: 3 Pain Intervention(s): RN made aware, meds given during session, rest and repositioned as needed    Therapy/Group: Individual Therapy  Cray Monnin 08/01/2018, 9:14 AM

## 2018-08-02 ENCOUNTER — Encounter (HOSPITAL_COMMUNITY): Payer: Medicare Other | Admitting: Psychology

## 2018-08-02 ENCOUNTER — Inpatient Hospital Stay (HOSPITAL_COMMUNITY): Payer: Medicare Other | Admitting: Speech Pathology

## 2018-08-02 ENCOUNTER — Inpatient Hospital Stay (HOSPITAL_COMMUNITY): Payer: Medicare Other | Admitting: Occupational Therapy

## 2018-08-02 ENCOUNTER — Inpatient Hospital Stay (HOSPITAL_COMMUNITY): Payer: Medicare Other

## 2018-08-02 ENCOUNTER — Inpatient Hospital Stay (HOSPITAL_COMMUNITY): Payer: Medicare Other | Admitting: Physical Therapy

## 2018-08-02 MED ORDER — LORAZEPAM 1 MG PO TABS
1.0000 mg | ORAL_TABLET | Freq: Once | ORAL | Status: AC
  Administered 2018-08-02: 1 mg via ORAL
  Filled 2018-08-02: qty 1

## 2018-08-02 MED ORDER — QUETIAPINE FUMARATE 100 MG PO TABS
100.0000 mg | ORAL_TABLET | Freq: Every day | ORAL | Status: DC
  Administered 2018-08-02: 100 mg via ORAL
  Filled 2018-08-02: qty 1

## 2018-08-02 NOTE — Consult Note (Signed)
Neuropsychological Consultation   Patient:   [name]   DOB:   [birth year]  MR Number:  [id number]  Location:  [facility] [place] [facility] [place] [id number] [place] [address] Dept: [place]: [phone]           Date of Service:   08/02/2018  Start Time:   8 AM End Time:   9 AM  Provider/Observer:  Ilean Skill, Psy.D.       Clinical Neuropsychologist       Billing Code/Service: 32951 4 Units  Chief Complaint:    [name]. [name] is a 68 year old male with a history of prior CVA with residual left hemiparesis, hep C, hypertension, shortness of breath, polysubstance abuse.  The patient was admitted on 07/20/2018 with worsening of slurred speech, right-sided weakness and right facial droop.  The patient was confused and agitated at admission with blood pressure noted to be elevated.  CT of the head was done and reviewed showing left thalamic hemorrhage.  Left thalamic hemorrhage with mild edema and evidence of chronic ischemic microangiopathy.  Dr. Leonie Man felt that the bleed was hypertensive in nature.  The patient continued to have issues with agitation and their questions about possible alcohol sensation as well as cessation of tobacco and THC.  Cognitive functioning has been improving and the patient has become increasingly oriented.  Reason for Service:  The patient was referred for neuropsychological consultation due to coping and adjustment issues following both chronic issues with previous CVA as well as significant motor deficits following recent thalamic bleed.  Below is the HPI for the current admission.  HPI: [name] is a 68 year old male with history of prior CVA with residual left hemiparesis, hep C, HTN, S OB, polysubstance abuse; who was admitted on 07/20/2018 with worsening of slurred speech, right-sided weakness and right facial droop.  History taken from chart review and  patient.  Patient was confused and agitated at admission and blood pressure noted to be elevated- 200/92.   UDS + THC. CT head done reviewed, showing left thalamic hemorrhage.  Per report, left thalamic hemorrhage with mild edema and evidence of chronic ischemic microangiopathy.  He was started on Cleviprex and this has been weaned off as blood pressure control improved CTA head/neck revealed atherosclerotic disease at both carotid bifurcation without stenosis and 25% stenosis right V4 and 50% stenosis left V4 segment.  Dr. Leonie Man felt that bleed hypertensive in nature and ASA discontinued.  He continued to have issues with agitation question due to alcohol withdrawal ---Seroquel scheduled and treated with CIWA protocol.   Patient has been educated on cessation of tobacco as well as THC.   Mentation is improving but patient continues to be limited by moderate cognitive deficits affecting safety awareness, memory as well as reports of right visual field deficits, right hemiplegia with right lean affecting mobility and ADLs therapy. CIR recommended due to functional deficits  Current Status:  The patient reports that he is becoming increasingly oriented and his mental status appeared to be good today although he was very drowsy.  The patient reported that he has been frustrated by the continued loss of motor functioning but he feels like his memory and cognitive function is generally returned to baseline.  The patient reports some increased agitation and this was particularly focused on concerns about his ability to smoke in the future and having to deal with things without smoking or drinking or using marijuana.  The patient freely admitted regular use of marijuana and reported that he was unaware of how these substances could be so detrimental to his overall health.  Behavioral Observation: [name]  presents as a 69 y.o.-year-old Right African American Male who appeared his stated age. his dress was  Appropriate and he was Well Groomed and his manners were Appropriate to the situation.  his participation was indicative of Appropriate and Drowsy behaviors.  There were any physical disabilities noted.  he displayed an appropriate level of cooperation and motivation.     Interactions:    Active Appropriate and Drowsy  Attention:   abnormal and attention span appeared shorter than expected for age  Memory:   abnormal; remote memory intact, recent memory impaired  Visuo-spatial:  not examined  Speech (Volume):  low  Speech:   normal; normal  Thought Process:  Coherent and Relevant  Though Content:  WNL; not suicidal and not homicidal  Orientation:   person, place, time/date and situation  Judgment:   Poor  Planning:   Poor  Affect:    Appropriate  Mood:    Dysphoric  Insight:   Shallow  Intelligence:   low  Substance Use:  There is a documented history of alcohol, marijuana and tobacco abuse confirmed by the patient.    Medical History:   Past Medical History:  Diagnosis Date  . Arthritis   . Cameron ulcer, chronic 01/11/2018  . Chronic back pain   . GERD (gastroesophageal reflux disease)    uses Baking Soda  . GIB (gastrointestinal bleeding) 2012  . Glaucoma    right eye  . Headache    occasionally  . Hepatitis C   . Hiatal hernia with gastroesophageal reflux disease and esophagitis 01/05/2018  . History of blood transfusion    no abnormal reaction noted  . History of gout    doesn't take any meds  . Hyperlipidemia    not on any meds  . Hypertension    takes Amlodipine and Lisinopril daily  . Insomnia    takes Ambien nightly  . Ischemic colitis ([place]) 2012  . Joint pain   . Nocturia   . Numbness    both legs occasionally  . PONV (postoperative nausea and vomiting)   . Prostate cancer ([place])   . Shortness of breath dyspnea    rarely but when notices he can be lying/sitting/exertion.Dr.Hochrein is aware per pt  . Stroke ([place]) 2016   . Urinary frequency    . Urinary urgency         Psychiatric History:  The patient denied any prior psychiatric history.  Family Med/Psych History:  Family History  Problem Relation Age of Onset  . Alzheimer's disease Father   . Cancer Mother        Lymph node  . Heart failure Brother 45       Transplant 13 years ago  . CAD Brother   . Heart disease Sister 74       MI    Risk of Suicide/Violence: low the patient denies any suicidal or homicidal ideation.  Impression/DX:  [name]. [name] is a 68 year old male with a history of prior CVA with residual left hemiparesis, hep C, hypertension, shortness of breath, polysubstance abuse.  The patient was admitted on 07/20/2018 with worsening of slurred speech, right-sided weakness and right facial droop.  The patient was confused and agitated at admission with blood pressure noted to be elevated.  CT of the head was done and reviewed  showing left thalamic hemorrhage.  Left thalamic hemorrhage with mild edema and evidence of chronic ischemic microangiopathy.  Dr. Leonie Man felt that the bleed was hypertensive in nature.  The patient continued to have issues with agitation and their questions about possible alcohol sensation as well as cessation of tobacco and THC.  Cognitive functioning has been improving and the patient has become increasingly oriented.  The patient reports that he is becoming increasingly oriented and his mental status appeared to be good today although he was very drowsy.  The patient reported that he has been frustrated by the continued loss of motor functioning but he feels like his memory and cognitive function is generally returned to baseline.  The patient reports some increased agitation and this was particularly focused on concerns about his ability to smoke in the future and having to deal with things without smoking or drinking or using marijuana.  The patient freely admitted regular use of marijuana and reported that he was unaware of how these  substances could be so detrimental to his overall health.   Disposition/Plan:  Worked on coping issues particular around adjusting to his ongoing motor deficits as well as issues related to his use of tobacco, marijuana, and alcohol.  Diagnosis:    Nontraumatic thalamic hemorrhage ([place]) - Plan: Ambulatory referral to Physical Medicine Rehab  Pain - Plan: DG Shoulder Left, DG Shoulder Left         Electronically Signed   _______________________ Ilean Skill, Psy.D.

## 2018-08-02 NOTE — Progress Notes (Signed)
Patient sitting in bed rocking back and forth  stated ," I am having a panic attack. I feel like a panic attack. " pt given relaxation techniques, pt having difficulty following instruction. Pt's anxiety not being controlled. Dan A. PA called and order received. Pt given  Ativan as ordered. Will monitor.

## 2018-08-02 NOTE — Progress Notes (Signed)
Speech Language Pathology Daily Session Note  Patient Details  Name: [name] MRN: [id number] Date of Birth: 05/28/1951  Today's Date: 08/02/2018 SLP Individual Time: 5520-8022 SLP Individual Time Calculation (min): 24 min  Short Term Goals: Week 1: SLP Short Term Goal 1 (Week 1): Pt will complete semi-complex problem solving tasks with Min A cues.  SLP Short Term Goal 2 (Week 1): Pt will self-monitor and self-correct errors with Min A cues.  SLP Short Term Goal 3 (Week 1): Pt will demonstrate selective attention in moderately distracting environment for ~ 30 minutes with supervision cues.  SLP Short Term Goal 4 (Week 1): Pt will utilize speech intelligibility strategies with Min A cues to achieve speech intelligibility to > 90% at the sentence level.  SLP Short Term Goal 5 (Week 1): Pt will compensatory memory aids to recall information related to dialy schedule and activities with Min A cues.   Skilled Therapeutic Interventions:  Pt was seen for skilled ST targeting cognitive goals.  SLP facilitated the session with a novel scavenger hunt activity to address memory goals.  Pt was able to find 6 targeted items from a list while navigating around the unit with supervision verbal cues for use of environmental aids like signs.  Pt recalled route back to room with supervision question cues and was left in chair with chair alarm set.  Continue per current plan of care.    Pain Pain Assessment Pain Scale: 0-10 Pain Score: 0-No pain  Therapy/Group: Individual Therapy  Khristen Cheyney, Selinda Orion 08/02/2018, 4:48 PM

## 2018-08-02 NOTE — Progress Notes (Signed)
Occupational Therapy Session Note  Patient Details  Name: [name] MRN: [id number] Date of Birth: June 09, 1951  Today's Date: 08/02/2018 OT Individual Time: 0900-1000 OT Individual Time Calculation (min): 60 min    Short Term Goals: Week 1:  OT Short Term Goal 1 (Week 1): Pt will complete toilet transfer with mod A of 1 OT Short Term Goal 2 (Week 1): Pt will complete sit<>stand within BADL task with mod A OT Short Term Goal 3 (Week 1): Pt will use R UE for 50% of bathing tasks  Skilled Therapeutic Interventions/Progress Updates:    1;1. Pt agreeable to shower this date. Pain in shoulder reported with rest provided PRN and RN already placed lidocane patch. Pt stand pivot transfer with closed chain transfers (bed rail or grab bar) with min A-mod A. Pt requries VC for seuqnencing bathing steps, however bathes body parts without VC. Pt requires min A for standing balance during bathing tasks/dressing tasks. Pt dons shirt with supervision and pants/footwear with min A overall. OT shaves face d/t cooredination deficits and recommends electric razor for improved indepence. Pt completes 9HPT RUE 1 min 23 sec LUE 57.9 seconds. Exited session with belt alarm donned and call light in reach and all needs emt  Therapy Documentation Precautions:  Precautions Precautions: Fall Precaution Comments: right hemiplegia, right lean Restrictions Weight Bearing Restrictions: No General:   Vital Signs:  Pain: Pain Assessment Pain Scale: 0-10 Pain Score: 5  Pain Type: Chronic pain Pain Location: Shoulder Pain Orientation: Left Pain Descriptors / Indicators: Aching Pain Frequency: Constant Pain Onset: On-going Patients Stated Pain Goal: 4 Pain Intervention(s): Medication (See eMAR)   Therapy/Group: Individual Therapy  Tonny Branch 08/02/2018, 12:12 PM

## 2018-08-02 NOTE — Progress Notes (Signed)
Occupational Therapy Session Note  Patient Details  Name: [name] MRN: [id number] Date of Birth: Nov 29, 1950  Today's Date: 08/02/2018 OT Individual Time: 0379-5583 OT Individual Time Calculation (min): 71 min    Short Term Goals: Week 1:  OT Short Term Goal 1 (Week 1): Pt will complete toilet transfer with mod A of 1 OT Short Term Goal 2 (Week 1): Pt will complete sit<>stand within BADL task with mod A OT Short Term Goal 3 (Week 1): Pt will use R UE for 50% of bathing tasks     Skilled Therapeutic Interventions/Progress Updates:    Pt seen this session to focus on RUE coordination, L shoulder ROM, balance, sit to stand, standing balance. Pt taken to gym via w/c. Pt worked on: -RW transfer to [employer] with mod A, min to stand to [employer] (pt is very adament about keeping B hands on RW and does not feel safe pushing up with his hands during the sit><stand transition)  -on mat, L shoulder A/arom with rolling ball forward and then with UE Ranger  - R UE coordination with grasp and release of bean bags and blocks (no difficulty), attempted to [employer] blocks but too difficult, controlled large GM ROM to avoid pt from accidentally hitting himself in the head, ball squeeze and rolling ball forward for resisted ROM.  -sit to stand from mat and from parallel bars (min A)  -standing balance at bars with weight shifts L><R (no significant lean), heel raises, stepping foot up onto platform (no difficulty) and bringing foot back onto floor (moderate difficulty), stepping R foot out laterally and in (good control), mini squats  - pt stated he has difficulty eating and drinking with R hand. Provided pt with weighted cup which he felt was easier to hold.  [name] pt a weighted spoon and fork to trial.    Pt taken back to room and chair belt alarm donned. All needs met.  Therapy Documentation Precautions:  Precautions Precautions: Fall Precaution Comments: right hemiplegia, right  lean Restrictions Weight Bearing Restrictions: No   Pain: Pain Assessment Pain Scale: 0-10 Pain Score: 5  Pain Type: Chronic pain Pain Location: Shoulder Pain Orientation: Left Pain Descriptors / Indicators: Aching Pain Frequency: Constant Pain Onset: On-going Patients Stated Pain Goal: 4 Pain Intervention(s): Medication (See eMAR)  Therapy/Group: Individual Therapy  [place] 08/02/2018, 12:17 PM

## 2018-08-02 NOTE — Progress Notes (Signed)
Physical Therapy Session Note  Patient Details  Name: [name] MRN: [id number] Date of Birth: June 01, 1951  Today's Date: 08/02/2018 PT Individual Time: 1615-1700   45 min   Short Term Goals: Week 1:  PT Short Term Goal 1 (Week 1): Patient to demonstrate transfers consistently with mod A PT Short Term Goal 2 (Week 1): Patient to perform w/c management with S x 150'. PT Short Term Goal 3 (Week 1): Patient to ambulate with mod A x 50' PT Short Term Goal 4 (Week 1): Patient to demonstrate sitting balance during functional task with S  Skilled Therapeutic Interventions/Progress Updates:   Pt received sitting in WC and agreeable to PT. Pt transported to rhab gym. Sit<>stand with min assist from PT for safety. Standing balance 4 x 30sec with BUE support and CGA from PT. Pt able to attain midline with verbal cues only from PT. Rest break between bouts due to mild dizziness with transitions.   Gait training with RW 2 x 65f and min-mod assist from PT for safety with min-mod cues for improved step width, midline orientation, and use of AD to improve balance while in stance on the RLE.   PT instructed pt in WC mobility with BUE x [id number] close supervision assist from PT and moderate cues for equal force through BUE to maintain straight path and improve turning technique.  [facility] to [facility] and [facility] and all needs met.        Therapy Documentation Precautions:  Precautions Precautions: Fall Precaution Comments: right hemiplegia, right lean Restrictions Weight Bearing Restrictions: No    Vital Signs: Therapy Vitals Temp: 98.3 F (36.8 C) Temp Source: Oral Pulse Rate: 68 Resp: 18 BP: (!) 148/71 Patient Position (if appropriate): Lying Oxygen Therapy SpO2: 100 % O2 Device: Room Air Pain:   0/10   Therapy/Group: Individual Therapy  AuLorie Phenix/15/2020, 4:30 PM

## 2018-08-02 NOTE — Progress Notes (Signed)
[place] PHYSICAL MEDICINE & REHABILITATION PROGRESS NOTE   Subjective/Complaints: Patient told nurse that he did not sleep last night.  I confronted the patient as nurse stated that he seemed to have slept more.  Patient did seem to think the Seroquel helped but told me he had used this in the past and had taken  a higher dose for sleep.  ROS: Patient denies fever, rash, sore throat, blurred vision, nausea, vomiting, diarrhea, cough, shortness of breath or chest pain, joint or back pain, headache, or mood change.   Objective:   No results found. Recent Labs    07/31/18 0718  WBC 7.0  HGB 7.5*  HCT 25.0*  PLT 363   Recent Labs    07/31/18 0718  NA 139  K 3.9  CL 109  CO2 24  GLUCOSE 104*  BUN 12  CREATININE 1.08  CALCIUM 8.7*    Intake/Output Summary (Last 24 hours) at 08/02/2018 0845 Last data filed at 08/02/2018 0745 Gross per 24 hour  Intake 360 ml  Output 300 ml  Net 60 ml     Physical Exam: Vital Signs Blood pressure (!) 157/70, pulse 63, temperature 98.1 F (36.7 C), temperature source Oral, resp. rate 18, height 5\' 9"  (1.753 m), weight 72.1 kg, SpO2 100 %.  Constitutional: No distress . Vital signs reviewed. HEENT: EOMI, oral membranes moist Neck: supple Cardiovascular: RRR without murmur. No JVD    Respiratory: CTA Bilaterally without wheezes or rales. Normal effort    GI: BS +, non-tender, non-distended  Musc: Left shoulder tender with external and internal rotation Neurological: Appears more alert Dysarthria , speech more clear today Follows basic commands. Fair insight.   Motor:  LUE: 2+/5 prox to 4-/5 distally --shoulder remains limited by discomfort LLE: HF, KE 3+/5, ADF 4/5 Skin: Skin is warm and dry.  Multiple healed lesions on shins Psychiatric: More pleasant and amicable today   Assessment/Plan: 1. Functional deficits secondary to left thalamic hemorrhage which require 3+ hours per day of interdisciplinary therapy in a comprehensive  inpatient rehab setting.  Physiatrist is providing close team supervision and 24 hour management of active medical problems listed below.  Physiatrist and rehab team continue to assess barriers to discharge/monitor patient progress toward functional and medical goals  Care Tool:  Bathing  Bathing activity did not occur: Refused Body parts bathed by patient: Right arm, Left arm, Chest, Abdomen, Front perineal area, Right upper leg, Left upper leg, Face   Body parts bathed by helper: Buttocks, Right lower leg, Left lower leg     Bathing assist Assist Level: Moderate Assistance - Patient 50 - 74%     Upper Body Dressing/Undressing Upper body dressing Upper body dressing/undressing activity did not occur (including orthotics): Refused What is the patient wearing?: Pull over shirt    Upper body assist Assist Level: Minimal Assistance - Patient > 75%    Lower Body Dressing/Undressing Lower body dressing    Lower body dressing activity did not occur: Refused What is the patient wearing?: Pants, Incontinence brief     Lower body assist Assist for lower body dressing: Moderate Assistance - Patient 50 - 74%     Toileting Toileting Toileting Activity did not occur (Clothing management and hygiene only): N/A (no void or bm)  Toileting assist Assist for toileting: Moderate Assistance - Patient 50 - 74%     Transfers Chair/bed transfer  Transfers assist     Chair/bed transfer assist level: Moderate Assistance - Patient 50 - 74%  Locomotion Ambulation   Ambulation assist      Assist level: Minimal Assistance - Patient > 75% Assistive device: Walker-rolling Max distance: 25   Walk 10 feet activity   Assist     Assist level: Minimal Assistance - Patient > 75%     Walk 50 feet activity   Assist Walk 50 feet with 2 turns activity did not occur: Safety/medical concerns         Walk 150 feet activity   Assist Walk 150 feet activity did not occur:  Safety/medical concerns         Walk 10 feet on uneven surface  activity   Assist Walk 10 feet on uneven surfaces activity did not occur: Safety/medical concerns         Wheelchair     Assist Will patient use wheelchair at discharge?: No Type of Wheelchair: Manual    Wheelchair assist level: Supervision/Verbal cueing Max wheelchair distance: 75    Wheelchair 50 feet with 2 turns activity    Assist    Wheelchair 50 feet with 2 turns activity did not occur: Safety/medical concerns   Assist Level: Supervision/Verbal cueing   Wheelchair 150 feet activity     Assist Wheelchair 150 feet activity did not occur: Safety/medical concerns         Medical Problem List and Plan: 1.  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  -Continue CIR therapies including PT, OT, and SLP   2.  DVT Prophylaxis/Anticoagulation: Pharmaceutical: Lovenox 3.  History of chronic back pain/headaches/pain Management: Tylenol as needed for now  -c/o significant left shoulder pain ("since first stroke"), suspect DJD/RTC/hemiplegic shoulder pain.    -shoulder xray fairly reviewed/fairly unremarkable   -schedule with muscle rub   -support left shoulder at rest, ROM with therapies, modaliteis   -Lidoderm patch started on 1/12   -consider steroid injection this week 4. Mood: LCSW to follow for evaluation and support 5. Neuropsych: This patient is not fully capable of making decisions on his own behalf. 6. Skin/Wound Care: Routine pressure relief measures 7. Fluids/Electrolytes/Nutrition: encourage PO 8.  HTN: Monitor blood pressures. Continue amlodipine and lisinopril-- continue to titrate medications to control.   Hydralazine 10 nightly started on 1/12 with some improvement, consider twice daily dosing    9.  Acute on chronic blood loss anemia/HH with gastritis:  Hemoglobin drifting down again - 7.5 1/13--->follow up  Wednesday  Recheck CBC tomorrow  Hemoccult positive  -Resumed iron supplement  -iron studies all c/w iron deficiency anemia  Appreciate GI notes, infusion ordered on 1/10 with repeat in 1 week  -no other intervention recommended 10. Chronic Insomnia/sleep state misperception:    Ttrazodone 100 mg nightly was ineffective--resumed home regimen of trazodone 50mg   and Ambien 5 mg--->pharmacy is limiting him to only 5 mg due to his age.  However patient uses 10 mg at home.  -increase Seroquel to 100 mg nightly  -Stopped trazodone.  -Stop Ambien    11. Tobacco dependence:  -not a nicotine patch candidate  -Continue trial of wellbutrin 75mg  daily      LOS: 7 days A FACE TO FACE EVALUATION WAS PERFORMED  Meredith Staggers 08/02/2018, 8:45 AM

## 2018-08-03 ENCOUNTER — Inpatient Hospital Stay (HOSPITAL_COMMUNITY): Payer: Medicare Other | Admitting: Speech Pathology

## 2018-08-03 ENCOUNTER — Inpatient Hospital Stay (HOSPITAL_COMMUNITY): Payer: Medicare Other | Admitting: Physical Therapy

## 2018-08-03 ENCOUNTER — Inpatient Hospital Stay (HOSPITAL_COMMUNITY): Payer: Medicare Other | Admitting: Occupational Therapy

## 2018-08-03 LAB — CBC
HCT: 25.7 % — ABNORMAL LOW (ref 39.0–52.0)
Hemoglobin: 7.5 g/dL — ABNORMAL LOW (ref 13.0–17.0)
MCH: 25.3 pg — ABNORMAL LOW (ref 26.0–34.0)
MCHC: 29.2 g/dL — ABNORMAL LOW (ref 30.0–36.0)
MCV: 86.5 fL (ref 80.0–100.0)
Platelets: 417 10*3/uL — ABNORMAL HIGH (ref 150–400)
RBC: 2.97 MIL/uL — ABNORMAL LOW (ref 4.22–5.81)
RDW: 21.7 % — ABNORMAL HIGH (ref 11.5–15.5)
WBC: 6.5 10*3/uL (ref 4.0–10.5)
nRBC: 0 % (ref 0.0–0.2)

## 2018-08-03 MED ORDER — ZOLPIDEM TARTRATE 5 MG PO TABS: 5.0000 mg | ORAL_TABLET | Freq: Every evening | ORAL | Status: DC | PRN

## 2018-08-03 MED ORDER — ZOLPIDEM TARTRATE 5 MG PO TABS
5.0000 mg | ORAL_TABLET | Freq: Every day | ORAL | Status: DC
  Administered 2018-08-03 – 2018-08-17 (×15): 5 mg via ORAL
  Filled 2018-08-03 (×15): qty 1

## 2018-08-03 MED ORDER — QUETIAPINE FUMARATE 50 MG PO TABS
50.0000 mg | ORAL_TABLET | Freq: Every day | ORAL | Status: DC
  Administered 2018-08-03 – 2018-08-17 (×15): 50 mg via ORAL
  Filled 2018-08-03 (×15): qty 1

## 2018-08-03 NOTE — Progress Notes (Signed)
Occupational Therapy Weekly Progress Note  Patient Details  Name: [name] MRN: [id number] Date of Birth: 12-Apr-1951  Beginning of progress report period: July 27, 2018 End of progress report period: August 03, 2018  Today's Date: 08/03/2018  Session 1 OT Individual Time: 6195-0932 OT Individual Time Calculation (min): 45 min   Session 2 OT Individual Time: 1415-1455 OT Individual Time Calculation (min): 40 min    Patient has met 3 of 3 short term goals.  Pt is making steady progress towards OT goals at this time. Pt has progressed to a consistent Mod A for functional transfers and has demonstrated improved use of R UE within BADL tasks. Continue current POC.  Patient continues to demonstrate the following deficits: muscle weakness, decreased activity tolerance, impaired timing and sequencing, unbalanced muscle activation, ataxia and decreased coordination, decreased awareness, decreased problem solving, decreased safety awareness and decreased memory and decreased sitting balance, decreased standing balance, decreased postural control, hemiplegia and decreased balance strategies and therefore will continue to benefit from skilled OT intervention to enhance overall performance with BADL.  Patient progressing toward long term goals..  Continue plan of care.  OT Short Term Goals Week 1:  OT Short Term Goal 1 (Week 1): Pt will complete toilet transfer with mod A of 1 OT Short Term Goal 1 - Progress (Week 1): Met OT Short Term Goal 2 (Week 1): Pt will complete sit<>stand within BADL task with mod A OT Short Term Goal 2 - Progress (Week 1): Met OT Short Term Goal 3 (Week 1): Pt will use R UE for 50% of bathing tasks OT Short Term Goal 3 - Progress (Week 1): Met Week 2:  OT Short Term Goal 1 (Week 2): Pt will complete toilet transfer with consistent min A OT Short Term Goal 2 (Week 2): Pt will complete shower transfer with min A OT Short Term Goal 3 (Week 2): Pt will complete LB  dressing with min A  Skilled Therapeutic Interventions/Progress Updates:      Session 1 Pt greeted seated in wc with nursing present and agreeable to OT treatment session. Pt declined to shower at this time 2/2 wanting to eat breakfast. Self-feeding with use of weighted utensils. Pt with improved coordination and grip strength to manipulate utensils and bring food to mouth. Worked on sit<>stands at the sink with pt needingt Mod A and verbal cues for technique not to pull up on sink. Min A for baalance when removing both UEs to wash face and hands. LB dressing with shoe donning task with pt needing assistance to don each shoe, but was then able to coordinate shoe tying. Pt left seated in wc at end of session with safety belt on and needs met.  Pain: Pain Assessment Pain Scale: 0-10 Pain Score: 6  Pain Type: Chronic pain Pain Location: Shoulder Pain Orientation: Left Pain Descriptors / Indicators: Aching Pain Frequency: Constant Pain Onset: On-going Patients Stated Pain Goal: 2 Pain Intervention(s): Repositioned  Session 2 Pt greeted semi-reclined in bed and agreeable to OT treatment session. Min A to advance R LE out of bed, then mod A stand-pivot to wc on R side. Worked on standing balance/endurance and R hand coordination with standing checkers game. Worked on finger isolation to advance checkers pieces using R UE. Pt needed min A for standing balance for 10 minute activity. Facilitation and tactile cues for R quad activation w/ fatigue. Pt reported incontinence in brief and returned to room for brief change. Sit<>stand at the sink with  mod A and verbal cues not to pull up on sink. Pt maintained standing balance with CGA while OT changed brief. Stand-pivot back to bed on L side with min A. Pt left semi-reclined in bed with needs met.   Therapy Documentation Precautions:  Precautions Precautions: Fall Precaution Comments: right hemiplegia, right lean Restrictions Weight Bearing  Restrictions: No Pain: Pain Assessment Pain Scale: 0-10 Pain Score: 6  Pain Type: Chronic pain Pain Location: Shoulder Pain Orientation: Left Pain Descriptors / Indicators: Aching Pain Frequency: Constant Pain Onset: On-going Patients Stated Pain Goal: 2 Pain Intervention(s): Repositioned  Therapy/Group: Individual Therapy  Valma Cava 08/03/2018, 2:57 PM

## 2018-08-03 NOTE — Progress Notes (Signed)
Social Work Patient ID: [name], male   DOB: June 04, 1951, 68 y.o.   MRN: [id number]   Have reviewed team conference with pt and daughter, Tillie Rung (via phone).  Both aware and agreeable with targeted d/c date of 1/31/ and supervision to min assist goals.  Pt very insistent that he wants a power w/c and feels it would be easily "justified".  Attempted to explain true, justifiable requirements for power, however, pt clearly not wanting to hear this.  I assured him that I can secure a standard w/c for him if this is recommended. Continue to follow.  Breck Hollinger, LCSW

## 2018-08-03 NOTE — Progress Notes (Signed)
[place] PHYSICAL MEDICINE & REHABILITATION PROGRESS NOTE   Subjective/Complaints: Sleep issues again? Groggy this morning. Girlfriend at bedside  ROS: Patient denies fever, rash, sore throat, blurred vision, nausea, vomiting, diarrhea, cough, shortness of breath or chest pain, joint or back pain, headache, or mood change. .   Objective:   No results found. Recent Labs    08/03/18 0744  WBC 6.5  HGB 7.5*  HCT 25.7*  PLT 417*   No results for input(s): NA, K, CL, CO2, GLUCOSE, BUN, CREATININE, CALCIUM in the last 72 hours.  Intake/Output Summary (Last 24 hours) at 08/03/2018 0916 Last data filed at 08/03/2018 0300 Gross per 24 hour  Intake 960 ml  Output 600 ml  Net 360 ml     Physical Exam: Vital Signs Blood pressure (!) 158/86, pulse 69, temperature 98.1 F (36.7 C), temperature source Oral, resp. rate 18, height 5\' 9"  (1.753 m), weight 72.1 kg, SpO2 100 %.  Constitutional: No distress . Vital signs reviewed. HEENT: EOMI, oral membranes moist Neck: supple Cardiovascular: RRR without murmur. No JVD    Respiratory: CTA Bilaterally without wheezes or rales. Normal effort    GI: BS +, non-tender, non-distended  Musc: Left shoulder tender with external and internal rotation Neurological: less alert but able to communicate Speech more dysarthric Follows basic commands. Fair insight.   Motor:  LUE: 2+/5 prox to 4-/5 distally --shoulder remains limited by discomfort LLE: HF, KE 3+/5, ADF 4/5 Skin: Skin is warm and dry.  Multiple healed lesions on shins Psychiatric: More pleasant and amicable today   Assessment/Plan: 1. Functional deficits secondary to left thalamic hemorrhage which require 3+ hours per day of interdisciplinary therapy in a comprehensive inpatient rehab setting.  Physiatrist is providing close team supervision and 24 hour management of active medical problems listed below.  Physiatrist and rehab team continue to assess barriers to discharge/monitor  patient progress toward functional and medical goals  Care Tool:  Bathing  Bathing activity did not occur: Refused Body parts bathed by patient: Right arm, Left arm, Chest, Abdomen, Front perineal area, Right upper leg, Left upper leg, Face   Body parts bathed by helper: Buttocks, Right lower leg, Left lower leg     Bathing assist Assist Level: Moderate Assistance - Patient 50 - 74%     Upper Body Dressing/Undressing Upper body dressing Upper body dressing/undressing activity did not occur (including orthotics): Refused What is the patient wearing?: Pull over shirt    Upper body assist Assist Level: Minimal Assistance - Patient > 75%    Lower Body Dressing/Undressing Lower body dressing    Lower body dressing activity did not occur: Refused What is the patient wearing?: Pants, Incontinence brief     Lower body assist Assist for lower body dressing: Moderate Assistance - Patient 50 - 74%     Toileting Toileting Toileting Activity did not occur (Clothing management and hygiene only): N/A (no void or bm)  Toileting assist Assist for toileting: Moderate Assistance - Patient 50 - 74%     Transfers Chair/bed transfer  Transfers assist     Chair/bed transfer assist level: Moderate Assistance - Patient 50 - 74%     Locomotion Ambulation   Ambulation assist      Assist level: Minimal Assistance - Patient > 75% Assistive device: Walker-rolling Max distance: 25   Walk 10 feet activity   Assist     Assist level: Minimal Assistance - Patient > 75%     Walk 50 feet activity   Assist  Walk 50 feet with 2 turns activity did not occur: Safety/medical concerns         Walk 150 feet activity   Assist Walk 150 feet activity did not occur: Safety/medical concerns         Walk 10 feet on uneven surface  activity   Assist Walk 10 feet on uneven surfaces activity did not occur: Safety/medical concerns         Wheelchair     Assist Will patient  use wheelchair at discharge?: No Type of Wheelchair: Manual    Wheelchair assist level: Supervision/Verbal cueing Max wheelchair distance: 75    Wheelchair 50 feet with 2 turns activity    Assist    Wheelchair 50 feet with 2 turns activity did not occur: Safety/medical concerns   Assist Level: Supervision/Verbal cueing   Wheelchair 150 feet activity     Assist Wheelchair 150 feet activity did not occur: Safety/medical concerns         Medical Problem List and Plan: 1.  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  -Continue CIR therapies including PT, OT, and SLP   2.  DVT Prophylaxis/Anticoagulation: Pharmaceutical: Lovenox 3.  History of chronic back pain/headaches/pain Management: Tylenol as needed for now  -c/o significant left shoulder pain ("since first stroke"), suspect DJD/RTC/hemiplegic shoulder pain.    -shoulder xray fairly reviewed/fairly unremarkable   -schedule with muscle rub   -ROM/support of left shoulder   -Lidoderm patch started on 1/12  4. Mood: LCSW to follow for evaluation and support 5. Neuropsych: This patient is not fully capable of making decisions on his own behalf. 6. Skin/Wound Care: Routine pressure relief measures 7. Fluids/Electrolytes/Nutrition: encourage PO 8.  HTN: Monitor blood pressures. Continue amlodipine and lisinopril-- continue to titrate medications to control.   Hydralazine 10 nightly started on 1/12 with some improvement, consider twice daily dosing    9.  Acute on chronic blood loss anemia/HH with gastritis:  Hemoglobin drifting down again - 7.5 1/13--->follow up Wednesday  CBC holding at 7.5  Hemoccult positive  -Resumed iron supplement  -iron studies all c/w iron deficiency anemia  Appreciate GI notes, infusion ordered on 1/10 with repeat in 1 week  -no other intervention recommended 10. Chronic Insomnia/sleep state misperception:    Ttrazodone 100 mg  nightly was ineffective--resumed home regimen of trazodone 50mg   and Ambien 5 mg--->pharmacy is limiting him to only 5 mg due to his age.  However patient uses 10 mg at home.  -too groggy with 100mg  seroquel  -resume 50mg  seroquel with 5mg  ambien---pt understands/agrees   11. Tobacco dependence:  -not a nicotine patch candidate  -Continue trial of wellbutrin 75mg  daily      LOS: 8 days A FACE TO FACE EVALUATION WAS PERFORMED  Meredith Staggers 08/03/2018, 9:16 AM

## 2018-08-03 NOTE — Progress Notes (Signed)
Physical Therapy Weekly Progress Note  Patient Details  Name: [name] MRN: [id number] Date of Birth: 06-Jul-1951  Beginning of progress report period: July 27, 2017 End of progress report period: August 03, 2017  Today's Date: 08/03/2018 PT Individual Time: 5681-2751 PT Individual Time Calculation (min): 83 min   Pt in w/c, complains of not sleeping well last night but is agreeable to therapy.  Pt intimally min A with transfers during session, becomes mod A at end of session due to fatigue.  Sit <> stand and mini squats with focus on LE strength and postural control with min A.  Supine NMR with LTR, LTR with add squeeze, bridge with add squeeze, hip/knee flex/ext with LEs on ball to improve coordination and ROM.  Pt requires frequent rest breaks and cuing for core and LE coordination.  Pt performs gait 40' x 3 during session with min A, manual facilitation for wt shifts and Rt LE control.  Attempt gait with AFO on Rt with some improved foot clearance. Seated Rt UE task for coordination and strength with peg task with improved coordination noted.  Pt performs nustep x 7 minutes for UE/LE coordination with 2 rest breaks due to fatigue and drowsiness. Pt left in room with alarm set, needs at hand.  Patient has met 4 of 4 short term goals.  Pt improving strength and balance, continues to be limited by decreased LE strength, impaired balance reactions, decreased activity tolerance.  Patient continues to demonstrate the following deficits muscle weakness, unbalanced muscle activation and decreased coordination, decreased awareness, decreased problem solving and decreased safety awareness and decreased standing balance, decreased postural control and decreased balance strategies and therefore will continue to benefit from skilled PT intervention to increase functional independence with mobility.  Patient progressing toward long term goals..  Continue plan of care.  PT Short Term Goals Week  1:  PT Short Term Goal 1 (Week 1): Patient to demonstrate transfers consistently with mod A PT Short Term Goal 1 - Progress (Week 1): Met PT Short Term Goal 2 (Week 1): Patient to perform w/c management with S x 150'. PT Short Term Goal 2 - Progress (Week 1): Met PT Short Term Goal 3 (Week 1): Patient to ambulate with mod A x 50' PT Short Term Goal 3 - Progress (Week 1): Met PT Short Term Goal 4 (Week 1): Patient to demonstrate sitting balance during functional task with S PT Short Term Goal 4 - Progress (Week 1): Met Week 2:  PT Short Term Goal 1 (Week 2): pt will consistently perform functional transfers with min A PT Short Term Goal 2 (Week 2): pt will perform gait x 50' in controlled environment with min A  Skilled Therapeutic Interventions/Progress Updates:  Ambulation/gait training;Stair training;Balance/vestibular training;Cognitive remediation/compensation;Therapeutic Exercise;Patient/family education;Therapeutic Activities;DME/adaptive equipment instruction;UE/LE Coordination activities;UE/LE Strength taining/ROM;Functional mobility training;Wheelchair propulsion/positioning;Functional electrical stimulation;Community reintegration;Discharge planning;Neuromuscular re-education;Splinting/orthotics;Psychosocial support;Pain management   Therapy Documentation Precautions:  Precautions Precautions: Fall Precaution Comments: right hemiplegia, right lean Restrictions Weight Bearing Restrictions: No Pain: Pt c/o back pain with stretching, eases with rest. Pt states his Lt shoulder feels "better"  Therapy/Group: Individual Therapy  [name]  08/03/2018, 11:09 AM

## 2018-08-03 NOTE — Progress Notes (Signed)
Pt is asleep at this time.

## 2018-08-03 NOTE — Progress Notes (Signed)
Pt is groggy but states still feels like having panic attack. Relaxation techniques reviewed pt given a fan for comfort and heat turned down in room per pt request. Family providing comfort.

## 2018-08-03 NOTE — Progress Notes (Signed)
Speech Language Pathology Weekly Progress and Session Note  Patient Details  Name: [name] MRN: [id number] Date of Birth: 12-23-1950  Beginning of progress report period: July 27, 2018 End of progress report period: August 03, 2018  Today's Date: 08/03/2018 SLP Individual Time: 1300-1340 SLP Individual Time Calculation (min): 40 min  Short Term Goals: Week 1: SLP Short Term Goal 1 (Week 1): Pt will complete semi-complex problem solving tasks with Min A cues.  SLP Short Term Goal 1 - Progress (Week 1): Met SLP Short Term Goal 2 (Week 1): Pt will self-monitor and self-correct errors with Min A cues.  SLP Short Term Goal 2 - Progress (Week 1): Met SLP Short Term Goal 3 (Week 1): Pt will demonstrate selective attention in moderately distracting environment for ~ 30 minutes with supervision cues.  SLP Short Term Goal 3 - Progress (Week 1): Met SLP Short Term Goal 4 (Week 1): Pt will utilize speech intelligibility strategies with Min A cues to achieve speech intelligibility to > 90% at the sentence level.  SLP Short Term Goal 4 - Progress (Week 1): Met SLP Short Term Goal 5 (Week 1): Pt will compensatory memory aids to recall information related to dialy schedule and activities with Min A cues.  SLP Short Term Goal 5 - Progress (Week 1): Met    New Short Term Goals: Week 2: SLP Short Term Goal 1 (Week 2): Pt will compensatory memory aids to recall information related to dialy schedule and activities with supervision verbal and visual cues.  SLP Short Term Goal 2 (Week 2): Pt will utilize speech intelligibility strategies with Supervision verbal cues to achieve speech intelligibility to > 90% at the sentence level.  SLP Short Term Goal 3 (Week 2): Pt will demonstrate selective attention in moderately distracting environment for ~ 60 minutes with supervision cues.  SLP Short Term Goal 4 (Week 2): Pt will self-monitor and self-correct errors during functional tasks with Supervision  verbal cues.  SLP Short Term Goal 5 (Week 2): Pt will complete semi-complex problem solving tasks with supervision verbal cues.   Weekly Progress Updates: Patient has made functional gains and has met 5 of 5 STGs this reporting period. Currently, patient requires overall Min A verbal cues to complete functional and familiar tasks safely in regards to problem solving, attention, awareness and recall. Patient also demonstrates increased use of speech intelligibility strategies and is ~90% intelligible at the sentence level with overall Min A verbal cues. Patient's function can be impacted by pain and perceived lack of sleep at times. Patient and family education ongoing. Patient would benefit from continued skilled SLP intervention to maximize his speech intelligibility and cognitive functioning and overall functional independence prior to discharge.      Intensity: Minumum of 1-2 x/day, 30 to 90 minutes Frequency: 3 to 5 out of 7 days Duration/Length of Stay: 1/31 Treatment/Interventions: Cognitive remediation/compensation;Internal/external aids;Cueing hierarchy;Therapeutic Activities;Functional tasks;Patient/family education;Speech/Language facilitation;Environmental controls   Daily Session  Skilled Therapeutic Interventions: Skilled treatment session focused on cognitive goals. SLP facilitated session by providing Max-Total A verbal cues to generate and recall associations made to maximize memory during a novel task. When asked, patient reported he knows his memory is "bad" and has been for "a long time" but chooses not to utilize compensatory strategies and "would just miss things." Patient lethargic throughout session and required cues for arousal.  Patient left with NT on commode. Continue with current plan of care.      Pain No/Denies Pain   Therapy/Group:  Individual Therapy  Calton Harshfield 08/03/2018, 6:42 AM

## 2018-08-04 ENCOUNTER — Inpatient Hospital Stay (HOSPITAL_COMMUNITY): Payer: Medicare Other | Admitting: Physical Therapy

## 2018-08-04 ENCOUNTER — Inpatient Hospital Stay (HOSPITAL_COMMUNITY): Payer: Medicare Other | Admitting: Occupational Therapy

## 2018-08-04 ENCOUNTER — Inpatient Hospital Stay (HOSPITAL_COMMUNITY): Payer: Medicare Other | Admitting: Speech Pathology

## 2018-08-04 MED ORDER — LIDOCAINE 5 % EX PTCH
1.0000 | MEDICATED_PATCH | CUTANEOUS | Status: DC
  Administered 2018-08-04: 1 via TRANSDERMAL

## 2018-08-04 MED ORDER — ACETAMINOPHEN 325 MG PO TABS
650.0000 mg | ORAL_TABLET | ORAL | Status: DC | PRN
  Administered 2018-08-04 – 2018-08-15 (×11): 650 mg via ORAL
  Filled 2018-08-04 (×11): qty 2

## 2018-08-04 MED ORDER — ALPRAZOLAM 0.25 MG PO TABS
0.2500 mg | ORAL_TABLET | Freq: Three times a day (TID) | ORAL | Status: DC | PRN
  Administered 2018-08-04 – 2018-08-17 (×13): 0.25 mg via ORAL
  Filled 2018-08-04 (×13): qty 1

## 2018-08-04 MED ORDER — LIDOCAINE 5 % EX PTCH: 1.0000 | MEDICATED_PATCH | CUTANEOUS | Status: DC

## 2018-08-04 MED ORDER — LIDOCAINE 5 % EX PTCH
1.0000 | MEDICATED_PATCH | CUTANEOUS | Status: DC
  Administered 2018-08-05 – 2018-08-18 (×13): 1 via TRANSDERMAL
  Filled 2018-08-04 (×14): qty 1

## 2018-08-04 NOTE — Progress Notes (Signed)
[place] PHYSICAL MEDICINE & REHABILITATION PROGRESS NOTE   Subjective/Complaints: Seemed to sleep better last night. Alert this morning. States he's having a lot of anxiety ("all the time")  ROS: Patient denies fever, rash, sore throat, blurred vision, nausea, vomiting, diarrhea, cough, shortness of breath or chest pain, joint or back pain, headache, or mood change.  .   Objective:   No results found. Recent Labs    08/03/18 0744  WBC 6.5  HGB 7.5*  HCT 25.7*  PLT 417*   No results for input(s): NA, K, CL, CO2, GLUCOSE, BUN, CREATININE, CALCIUM in the last 72 hours.  Intake/Output Summary (Last 24 hours) at 08/04/2018 0905 Last data filed at 08/04/2018 0839 Gross per 24 hour  Intake 960 ml  Output 925 ml  Net 35 ml     Physical Exam: Vital Signs Blood pressure (!) 152/59, pulse 68, temperature 98.5 F (36.9 C), temperature source Oral, resp. rate 19, height 5\' 9"  (1.753 m), weight 72.1 kg, SpO2 100 %.  Constitutional: No distress . Vital signs reviewed. HEENT: EOMI, oral membranes moist Neck: supple Cardiovascular: RRR without murmur. No JVD    Respiratory: CTA Bilaterally without wheezes or rales. Normal effort    GI: BS +, non-tender, non-distended  Musc: Left shoulder tender with external and internal rotation--perhaps a little better Neurological: less alert but able to communicate Speech more dysarthric Follows basic commands. Fair insight.   Motor:  LUE: 2+/5 prox to 4-/5 distally --left shoulder pain LLE: HF, KE 3+/5, ADF 4/5 Skin: Skin is warm and dry.  Multiple healed lesions on shins Psychiatric: cooperative   Assessment/Plan: 1. Functional deficits secondary to left thalamic hemorrhage which require 3+ hours per day of interdisciplinary therapy in a comprehensive inpatient rehab setting.  Physiatrist is providing close team supervision and 24 hour management of active medical problems listed below.  Physiatrist and rehab team continue to assess  barriers to discharge/monitor patient progress toward functional and medical goals  Care Tool:  Bathing  Bathing activity did not occur: Refused Body parts bathed by patient: Right arm, Left arm, Chest, Abdomen, Front perineal area, Right upper leg, Left upper leg, Face   Body parts bathed by helper: Buttocks, Right lower leg, Left lower leg     Bathing assist Assist Level: Moderate Assistance - Patient 50 - 74%     Upper Body Dressing/Undressing Upper body dressing Upper body dressing/undressing activity did not occur (including orthotics): Refused What is the patient wearing?: Pull over shirt    Upper body assist Assist Level: Minimal Assistance - Patient > 75%    Lower Body Dressing/Undressing Lower body dressing    Lower body dressing activity did not occur: Refused What is the patient wearing?: Pants, Incontinence brief     Lower body assist Assist for lower body dressing: Moderate Assistance - Patient 50 - 74%     Toileting Toileting Toileting Activity did not occur (Clothing management and hygiene only): N/A (no void or bm)  Toileting assist Assist for toileting: Moderate Assistance - Patient 50 - 74%     Transfers Chair/bed transfer  Transfers assist     Chair/bed transfer assist level: Moderate Assistance - Patient 50 - 74%     Locomotion Ambulation   Ambulation assist      Assist level: Minimal Assistance - Patient > 75% Assistive device: Walker-rolling Max distance: 25   Walk 10 feet activity   Assist     Assist level: Minimal Assistance - Patient > 75%  Walk 50 feet activity   Assist Walk 50 feet with 2 turns activity did not occur: Safety/medical concerns         Walk 150 feet activity   Assist Walk 150 feet activity did not occur: Safety/medical concerns         Walk 10 feet on uneven surface  activity   Assist Walk 10 feet on uneven surfaces activity did not occur: Safety/medical concerns          Wheelchair     Assist Will patient use wheelchair at discharge?: No Type of Wheelchair: Manual    Wheelchair assist level: Supervision/Verbal cueing Max wheelchair distance: 75    Wheelchair 50 feet with 2 turns activity    Assist    Wheelchair 50 feet with 2 turns activity did not occur: Safety/medical concerns   Assist Level: Supervision/Verbal cueing   Wheelchair 150 feet activity     Assist Wheelchair 150 feet activity did not occur: Safety/medical concerns         Medical Problem List and Plan: 1.  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  -Continue CIR therapies including PT, OT, and SLP    2.  DVT Prophylaxis/Anticoagulation: Pharmaceutical: Lovenox 3.  History of chronic back pain/headaches/pain Management: Tylenol as needed for now  -c/o significant left shoulder pain ("since first stroke"), suspect DJD/RTC/hemiplegic shoulder pain.    -shoulder xray fairly reviewed/fairly unremarkable   -schedule with muscle rub   -ROM/support of left shoulder   -Lidoderm patch started on 1/12  4. Mood: LCSW to follow for evaluation and support  -add low dose xanax prn 5. Neuropsych: This patient is not fully capable of making decisions on his own behalf. 6. Skin/Wound Care: Routine pressure relief measures 7. Fluids/Electrolytes/Nutrition: encourage PO 8.  HTN: Monitor blood pressures. Continue amlodipine and lisinopril-- continue to titrate medications to control.   Hydralazine 10 nightly started on 1/12 with some improvement, consider twice daily dosing    9.  Acute on chronic blood loss anemia/HH with gastritis:   CBC holding at 7.5 1/16===recheck monday  Hemoccult positive  -Resumed iron supplement  -iron studies all c/w iron deficiency anemia  Appreciate GI notes, infusion ordered on 1/10 with repeat in 1 week  -no other intervention recommended at present 10. Chronic Insomnia/sleep state  misperception:    Ttrazodone 100 mg nightly was ineffective--resumed home regimen of trazodone 50mg   and Ambien 5 mg--->pharmacy is limiting him to only 5 mg due to his age.  However patient uses 10 mg at home.  -resumed 50mg  seroquel with 5mg  ambien-   11. Tobacco dependence:  -not a nicotine patch candidate  -Continue trial of wellbutrin 75mg  daily      LOS: 9 days A FACE TO FACE EVALUATION WAS PERFORMED  Meredith Staggers 08/04/2018, 9:05 AM

## 2018-08-04 NOTE — Progress Notes (Signed)
Occupational Therapy Session Note  Patient Details  Name: [name] MRN: [id number] Date of Birth: [birth year]  Today's Date: 08/04/2018 OT Individual Time: 8657-8469 OT Individual Time Calculation (min): 70 min   Short Term Goals: Week 2:  OT Short Term Goal 1 (Week 2): Pt will complete toilet transfer with consistent min A OT Short Term Goal 2 (Week 2): Pt will complete shower transfer with min A OT Short Term Goal 3 (Week 2): Pt will complete LB dressing with min A  Skilled Therapeutic Interventions/Progress Updates:    Pt greeted seated EOB with friend present and agreeable to shower today. Pt's friend left, and pt completed stand-pivot transfer to wc on R side with mod A. Pt reported need for bathroom. Min A stand-pivot to commode with heavy use of grab bars. Pt needed assistance for clothing management and toileting after successful BM and void of bladder. Stand-pivot back to wc then to shower bench with min A. Bathing completed with focus on functional use of R UE to wash body parts. Worked on dressing strategies sit<>stand from wc with mod A for LB dressing and min A UB dressing. Min/mod A for multiple sit<>stands. Shaving completed at the sink with pt guiding R UE with L for increased stability. Pt able to shave some, but needed OT assist for thoroughness and safety. Pt able to achieve figure 4 position today and don socks with min A. Pt requests to sit EOB at end of session. Min A stand-pivot to EOB and pt left with bed alarm on and tray table in front of pt. Needs met and call bell in reach.   Therapy Documentation Precautions:  Precautions Precautions: Fall Precaution Comments: right hemiplegia, right lean Restrictions Weight Bearing Restrictions: No Pain: Pain Assessment Pain Scale: Faces Pain Score: 6  Faces Pain Scale: Hurts even more Pain Type: Acute pain Pain Location: Hip Pain Orientation: Left Pain Descriptors / Indicators: Aching Pain Frequency:  Intermittent Pain Onset: On-going Patients Stated Pain Goal: 3 Pain Intervention(s): Repositioned  Therapy/Group: Individual Therapy  Valma Cava 08/04/2018, 2:30 PM

## 2018-08-04 NOTE — Progress Notes (Signed)
Physical Therapy Session Note  Patient Details  Name: [name] MRN: [id number] Date of Birth: [birth year]  Today's Date: 08/04/2018 PT Individual Time: 0815-0925 PT Individual Time Calculation (min): 70 min   Short Term Goals: Week 2:  PT Short Term Goal 1 (Week 2): pt will consistently perform functional transfers with min A PT Short Term Goal 2 (Week 2): pt will perform gait x 50' in controlled environment with min A Skilled Therapeutic Interventions/Progress Updates:   Pt received sitting in WC and agreeable to PT  Lower body dressing with min assist to thread clothes over feet and min assist for sit<>stand to pull to waist. Pt reports severe pain in L neck and shoulder, RN aware and provided pain medicaotin  Gait training with R AFO and RW x 64f, min assist overall from PT with moderate cues for gait pattern, and AD management in turns.   Stand pivot trasnfers with RW and min-mod assist from PT. Sit<>supine with min assist from PT to control trunk or RLE.  PT perform manual therapy to relieve trigger poin in R sub aoccitpials and splenius capitus. Hip distraction 3 x 30sec to the LLE to reduce new hip pain. Mild improvement in pain in neck following manual therapy.   WC mobility x 1025fwith supervision assist min cues for improved use of UE to maintain straight pain.   Kinetron seated reciprocal movement training 4 x 30 sec with short rest break between bouts. While performed reciprocal movement training, pt reports severe L side neck cramps. PT performed Positive skin Turger test, and educated pt on importance of adequate hydration.   [facility] returned to [facility] and [facility] reach and all needs met.        Therapy Documentation Precautions:  Precautions Precautions: Fall Precaution Comments: right hemiplegia, right lean Restrictions Weight Bearing Restrictions: No Pain: Pain Assessment Pain Scale: 0-10 Pain Score: 9  Pain Type: Chronic  pain Pain Location: Neck Pain Orientation: Left Pain Descriptors / Indicators: Cramping Pain Frequency: Constant Pain Onset: Sudden Patients Stated Pain Goal: 5 Pain Intervention(s): Medication (See eMAR)   Therapy/Group: Individual Therapy  AuLorie Phenix/17/2020, 9:59 AM

## 2018-08-04 NOTE — Progress Notes (Signed)
Speech Language Pathology Daily Session Note  Patient Details  Name: [name] MRN: [id number] Date of Birth: [birth year]  Today's Date: 08/04/2018 SLP Individual Time: 1106-1206 SLP Individual Time Calculation (min): 60 min  Short Term Goals: Week 2: SLP Short Term Goal 1 (Week 2): Pt will compensatory memory aids to recall information related to dialy schedule and activities with supervision verbal and visual cues.  SLP Short Term Goal 2 (Week 2): Pt will utilize speech intelligibility strategies with Supervision verbal cues to achieve speech intelligibility to > 90% at the sentence level.  SLP Short Term Goal 3 (Week 2): Pt will demonstrate selective attention in moderately distracting environment for ~ 60 minutes with supervision cues.  SLP Short Term Goal 4 (Week 2): Pt will self-monitor and self-correct errors during functional tasks with Supervision verbal cues.  SLP Short Term Goal 5 (Week 2): Pt will complete semi-complex problem solving tasks with supervision verbal cues.   Skilled Therapeutic Interventions:  Pt was seen for skilled ST targeting cognitive goals.  Pt with complaints of left hip pain upon arrival, RN made aware.  Pt not reluctant to participate in therapy but was not overly eager for speech therapy session.  Pt again endorses baseline memory deficits but does not feel there is anything he can do to improve it.  SLP provided encouragement.  SLP facilitated the session with a novel card game targeting problem solving goals.  Pt needed mod faded to min assist verbal cues to plan and execute a problem solving strategy within task.  Pt selectively attended to task for its duration (~30 min) with no cues needed for redirection.  Pt was returned to the room where he requested to use the bathroom.  Pt's brief was wet but pt was able to void a small amount with continence.  Pt felt he had to have a bowel movement but was unsuccessful.  Pt left in bed with bed alarm set and call  bell within reach.  Continue plan of care.    Pain Pain Assessment Pain Scale: Faces Pain Score: 6  Faces Pain Scale: Hurts even more Pain Type: Acute pain Pain Location: Hip Pain Orientation: Left Pain Descriptors / Indicators: Aching Pain Frequency: Intermittent Pain Onset: On-going Patients Stated Pain Goal: 3 Pain Intervention(s): RN made aware  Therapy/Group: Individual Therapy  Elain Wixon, Selinda Orion 08/04/2018, 12:53 PM

## 2018-08-05 ENCOUNTER — Inpatient Hospital Stay (HOSPITAL_COMMUNITY): Payer: Medicare Other | Admitting: Occupational Therapy

## 2018-08-05 NOTE — Progress Notes (Signed)
[facility] PHYSICAL MEDICINE & REHABILITATION PROGRESS NOTE   Subjective/Complaints: Patient states he is having some pain in his left knee.  I asked where he stated from the lower thigh into the leg across the knee.  ROS: Patient denies fever, rash, sore throat, blurred vision, nausea, vomiting, diarrhea, cough, shortness of breath or chest pain,   back pain, headache, or mood change.   Objective:   No results found. Recent Labs    08/03/18 0744  WBC 6.5  HGB 7.5*  HCT 25.7*  PLT 417*   No results for input(s): NA, K, CL, CO2, GLUCOSE, BUN, CREATININE, CALCIUM in the last 72 hours.  Intake/Output Summary (Last 24 hours) at 08/05/2018 0925 Last data filed at 08/05/2018 0400 Gross per 24 hour  Intake 742 ml  Output 1375 ml  Net -633 ml     Physical Exam: Vital Signs Blood pressure (!) 165/62, pulse 62, temperature 98.4 F (36.9 C), temperature source Oral, resp. rate 18, height 5\' 9"  (1.753 m), weight 72.1 kg, SpO2 98 %.  Constitutional: No distress . Vital signs reviewed. HEENT: EOMI, oral membranes moist Neck: supple Cardiovascular: RRR without murmur. No JVD    Respiratory: CTA Bilaterally without wheezes or rales. Normal effort    GI: BS +, non-tender, non-distended  Musc: Left shoulder tender with external and internal rotation--perhaps a little better  -No left knee tenderness with palpation or with range of motion of the knee or leg.  No calf tenderness Neurological: Fairly alert, dysarthric Follows basic commands. Fair insight.   Motor:  LUE: 2+/5 prox to 4-/5 distally --left shoulder pain LLE: HF, KE 3+/5, ADF 4/5--no change Skin: Skin is warm and dry.  Multiple healed lesions on shins Psychiatric: cooperative   Assessment/Plan: 1. Functional deficits secondary to left thalamic hemorrhage which require 3+ hours per day of interdisciplinary therapy in a comprehensive inpatient rehab setting.  Physiatrist is providing close team supervision and 24 hour  management of active medical problems listed below.  Physiatrist and rehab team continue to assess barriers to discharge/monitor patient progress toward functional and medical goals  Care Tool:  Bathing  Bathing activity did not occur: Refused Body parts bathed by patient: Right arm, Left arm, Chest, Abdomen, Front perineal area, Right upper leg, Left upper leg, Face   Body parts bathed by helper: Buttocks, Right lower leg, Left lower leg     Bathing assist Assist Level: Moderate Assistance - Patient 50 - 74%     Upper Body Dressing/Undressing Upper body dressing Upper body dressing/undressing activity did not occur (including orthotics): Refused What is the patient wearing?: Pull over shirt    Upper body assist Assist Level: Minimal Assistance - Patient > 75%    Lower Body Dressing/Undressing Lower body dressing    Lower body dressing activity did not occur: Refused What is the patient wearing?: Pants, Incontinence brief     Lower body assist Assist for lower body dressing: Moderate Assistance - Patient 50 - 74%     Toileting Toileting Toileting Activity did not occur (Clothing management and hygiene only): N/A (no void or bm)  Toileting assist Assist for toileting: Moderate Assistance - Patient 50 - 74%     Transfers Chair/bed transfer  Transfers assist     Chair/bed transfer assist level: Moderate Assistance - Patient 50 - 74%     Locomotion Ambulation   Ambulation assist      Assist level: Moderate Assistance - Patient 50 - 74%(gait belt 2nd helper with W/C ) Assistive  device: Walker-rolling Max distance: 50   Walk 10 feet activity   Assist     Assist level: Moderate Assistance - Patient - 50 - 74% Assistive device: Walker-rolling   Walk 50 feet activity   Assist Walk 50 feet with 2 turns activity did not occur: Safety/medical concerns         Walk 150 feet activity   Assist Walk 150 feet activity did not occur: Safety/medical  concerns         Walk 10 feet on uneven surface  activity   Assist Walk 10 feet on uneven surfaces activity did not occur: Safety/medical concerns         Wheelchair     Assist Will patient use wheelchair at discharge?: No Type of Wheelchair: Manual    Wheelchair assist level: Supervision/Verbal cueing Max wheelchair distance: 17ft    Wheelchair 50 feet with 2 turns activity    Assist    Wheelchair 50 feet with 2 turns activity did not occur: Safety/medical concerns   Assist Level: Supervision/Verbal cueing   Wheelchair 150 feet activity     Assist Wheelchair 150 feet activity did not occur: Refused         Medical Problem List and Plan: 1.  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  -Continue CIR therapies including PT, OT, and SLP    2.  DVT Prophylaxis/Anticoagulation: Pharmaceutical: Lovenox 3.  History of chronic back pain/headaches/pain Management: Tylenol as needed for now  -c/o significant left shoulder pain ("since first stroke"), suspect DJD/RTC/hemiplegic shoulder pain.    -Continue current plan for left shoulder including range of motion, support, analgesics   -New left knee/leg pain.  Nonspecific.  I see no abnormalities on examination today.   4. Mood: LCSW to follow for evaluation and support  -added low dose xanax prn 5. Neuropsych: This patient is not fully capable of making decisions on his own behalf. 6. Skin/Wound Care: Routine pressure relief measures 7. Fluids/Electrolytes/Nutrition: encourage PO 8.  HTN: Monitor blood pressures. Continue amlodipine and lisinopril-- continue to titrate medications to control.   Hydralazine 10 nightly started on 1/12 with some improvement, consider twice daily dosing    9.  Acute on chronic blood loss anemia/HH with gastritis:   CBC holding at 7.5 1/16===recheck monday  Hemoccult positive  -Resumed iron supplement  -iron studies  all c/w iron deficiency anemia  Appreciate GI notes, infusion ordered on 1/10 with repeat in 1 week  -no other intervention recommended at present 10. Chronic Insomnia/sleep state misperception:    Ttrazodone 100 mg nightly was ineffective--resumed home regimen of trazodone 50mg   and Ambien 5 mg--->pharmacy is limiting him to only 5 mg due to his age.  However patient uses 10 mg at home.  -resumed 50mg  seroquel with 5mg  ambien-this regimen seems to be working the best so far 11. Tobacco dependence:  -not a nicotine patch candidate  -Continue trial of wellbutrin 75mg  daily      LOS: 10 days A FACE TO FACE EVALUATION WAS PERFORMED  Meredith Staggers 08/05/2018, 9:25 AM

## 2018-08-05 NOTE — Progress Notes (Signed)
Occupational Therapy Session Note  Patient Details  Name: [name] MRN: [id number] Date of Birth: 1950-11-25  Today's Date: 08/05/2018 OT Individual Time: 1245-1330 OT Individual Time Calculation (min): 45 min   Skilled Therapeutic Interventions/Progress Updates: patient sleeping upon approach for OT, but concurred to work on right UE motor control and hand to mouth practice for self feeding.   He complained that the weighted utensils are heavy but that he often has difficulty controling the 'plastic' utensils for scooping or completing hand to mouth.  He complained that an area he described as his left anterior deltoid and lateral pectoralis hurt when he flexes his left upper extremity past 50 degrees.   This clinician slightly worked on stretch the very tight, 'knotted' muscles in order to help increase use and bilateral integration activities with his most recently affect right upper extremity.   As well, he was given a disposable hot pack placed on the tight muscular area.  At the end of the session, he was left resting on his bed with his head of bed slightly elevated.     Therapy Documentation Precautions:  Precautions Precautions: Fall Precaution Comments: right hemiplegia, right lean Restrictions Weight Bearing Restrictions: No  Pain:denied   Therapy/Group: Individual Therapy  Alfredia Ferguson [facility] 08/05/2018, 3:59 PM

## 2018-08-06 ENCOUNTER — Inpatient Hospital Stay (HOSPITAL_COMMUNITY): Payer: Medicare Other

## 2018-08-06 NOTE — Progress Notes (Signed)
Patient called nurse to room. Patient stated he wanted to get up and walk to the door. Nurse agreed to let patient try to stand up. Patient was unable to stand due to leg weakness. Nurse advised patient it was not a good idea to try and walk while he was weak in the legs. Patient became very angry and cussing the nurse and throwing his things and trying to stand up. Nurse called for help. Several staff came to the room and patient became more angry and yelling. Two staff members were able to help patient stand together and walk to door. Patient calmed down but was still very upset with nurse. Patient in  Bed, bed alarm on and call light in place when nurse left room.  Nicholes Rough, LPN

## 2018-08-06 NOTE — Progress Notes (Signed)
PHYSICAL MEDICINE & REHABILITATION PROGRESS NOTE   Subjective/Complaints: Patient again states that were not doing anything about his sleep.  He is up eating breakfast when I arrived.  ROS: Limited due to cognitive/behavioral    Objective:   No results found. No results for input(s): WBC, HGB, HCT, PLT in the last 72 hours. No results for input(s): NA, K, CL, CO2, GLUCOSE, BUN, CREATININE, CALCIUM in the last 72 hours.  Intake/Output Summary (Last 24 hours) at 08/06/2018 0917 Last data filed at 08/06/2018 0903 Gross per 24 hour  Intake 1200 ml  Output 700 ml  Net 500 ml     Physical Exam: Vital Signs Blood pressure (!) 150/73, pulse 70, temperature 99.4 F (37.4 C), temperature source Oral, resp. rate (!) 22, height 5\' 9"  (1.753 m), weight 72.1 kg, SpO2 100 %.  Constitutional: No distress . Vital signs reviewed. HEENT: EOMI, oral membranes moist Neck: supple Cardiovascular: RRR without murmur. No JVD    Respiratory: CTA Bilaterally without wheezes or rales. Normal effort    GI: BS +, non-tender, non-distended  Musc: Left shoulder tender with external and internal rotation--perhaps a little better  -No left knee tenderness with palpation or with range of motion of the knee or leg.  No calf tenderness Neurological: Fairly alert, dysarthric Follows basic commands. Fair insight.   Motor:  LUE: 2+/5 prox to 4-/5 distally --left shoulder pain LLE: HF, KE 3+/5, ADF 4/5--no change Skin: Skin is warm and dry.  Multiple healed lesions on shins Psychiatric: irritable   Assessment/Plan: 1. Functional deficits secondary to left thalamic hemorrhage which require 3+ hours per day of interdisciplinary therapy in a comprehensive inpatient rehab setting.  Physiatrist is providing close team supervision and 24 hour management of active medical problems listed below.  Physiatrist and rehab team continue to assess barriers to discharge/monitor patient progress toward  functional and medical goals  Care Tool:  Bathing  Bathing activity did not occur: Refused Body parts bathed by patient: Right arm, Left arm, Chest, Abdomen, Front perineal area, Right upper leg, Left upper leg, Face   Body parts bathed by helper: Buttocks, Right lower leg, Left lower leg     Bathing assist Assist Level: Moderate Assistance - Patient 50 - 74%     Upper Body Dressing/Undressing Upper body dressing Upper body dressing/undressing activity did not occur (including orthotics): Refused What is the patient wearing?: Pull over shirt    Upper body assist Assist Level: Minimal Assistance - Patient > 75%    Lower Body Dressing/Undressing Lower body dressing    Lower body dressing activity did not occur: Refused What is the patient wearing?: Pants, Incontinence brief     Lower body assist Assist for lower body dressing: Moderate Assistance - Patient 50 - 74%     Toileting Toileting Toileting Activity did not occur (Clothing management and hygiene only): N/A (no void or bm)  Toileting assist Assist for toileting: Moderate Assistance - Patient 50 - 74%     Transfers Chair/bed transfer  Transfers assist     Chair/bed transfer assist level: Moderate Assistance - Patient 50 - 74%     Locomotion Ambulation   Ambulation assist      Assist level: Moderate Assistance - Patient 50 - 74%(gait belt 2nd helper with W/C ) Assistive device: Walker-rolling Max distance: 50   Walk 10 feet activity   Assist     Assist level: Moderate Assistance - Patient - 50 - 74% Assistive device: Walker-rolling   Walk 50  feet activity   Assist Walk 50 feet with 2 turns activity did not occur: Safety/medical concerns         Walk 150 feet activity   Assist Walk 150 feet activity did not occur: Safety/medical concerns         Walk 10 feet on uneven surface  activity   Assist Walk 10 feet on uneven surfaces activity did not occur: Safety/medical  concerns         Wheelchair     Assist Will patient use wheelchair at discharge?: No Type of Wheelchair: Manual    Wheelchair assist level: Supervision/Verbal cueing Max wheelchair distance: 155ft    Wheelchair 50 feet with 2 turns activity    Assist    Wheelchair 50 feet with 2 turns activity did not occur: Safety/medical concerns   Assist Level: Supervision/Verbal cueing   Wheelchair 150 feet activity     Assist Wheelchair 150 feet activity did not occur: Refused         Medical Problem List and Plan: 1.  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  -Continue CIR therapies including PT, OT, and SLP    2.  DVT Prophylaxis/Anticoagulation: Pharmaceutical: Lovenox 3.  History of chronic back pain/headaches/pain Management: Tylenol as needed for now  -c/o significant left shoulder pain ("since first stroke"), suspect DJD/RTC/hemiplegic shoulder pain.    -Continue current plan for left shoulder including range of motion, support, analgesics   -New left knee/leg pain.  Nonspecific, ?resolved 4. Mood: LCSW to follow for evaluation and support  -added low dose xanax prn 5. Neuropsych: This patient is not fully capable of making decisions on his own behalf. 6. Skin/Wound Care: Routine pressure relief measures 7. Fluids/Electrolytes/Nutrition: encourage PO 8.  HTN: Monitor blood pressures. Continue amlodipine and lisinopril-- continue to titrate medications to control.   Hydralazine 10 nightly started on 1/12 with some improvement, consider twice daily dosing    9.  Acute on chronic blood loss anemia/HH with gastritis:   CBC holding at 7.5 1/16===recheck monday  Hemoccult positive  -Resumed iron supplement  -iron studies all c/w iron deficiency anemia  Appreciate GI notes, infusion ordered on 1/10 with repeat in 1 week  -no other intervention recommended at present 10. Chronic Insomnia/sleep state  misperception:    Ttrazodone 100 mg nightly was ineffective--resumed home regimen of trazodone 50mg   and Ambien 5 mg--->pharmacy is limiting him to only 5 mg due to his age.  However patient uses 10 mg at home.  -resumed 50mg  seroquel with 5mg  ambien-this regimen seems to be working the best so far although he's infrequently content 11. Tobacco dependence:  -not a nicotine patch candidate  -Continue trial of wellbutrin 75mg  daily      LOS: 11 days A FACE TO FACE EVALUATION WAS PERFORMED  Meredith Staggers 08/06/2018, 9:17 AM

## 2018-08-06 NOTE — Progress Notes (Signed)
Physical Therapy Session Note  Patient Details  Name: [name] MRN: [id number] Date of Birth: 08-01-1950  Today's Date: 08/06/2018 PT Individual Time: 0930-1030 PT Individual Time Calculation (min): 60 min   Short Term Goals: Week 2:  PT Short Term Goal 1 (Week 2): pt will consistently perform functional transfers with min A PT Short Term Goal 2 (Week 2): pt will perform gait x 50' in controlled environment with min A  Skilled Therapeutic Interventions/Progress Updates:   Pt sitting up in w/c.  Pt stated that his r wrist hurt, 6/10.  AROM painful and lateral aspect tender.  PT offered ice, but pt declined.  He declined meds for pain.  PT informed Margaretha Sheffield, Therapist, sports.  Pt stated that he felt very tired because he did not sleep well last night.  neuromuscular re-education via forced use, mulitmodal cues for alternating reciprocal movement bil LEs , seated in w/c, using Kinetron at level 30 cm/sec x 30 cycles x 3. Reciprocal scooting on mat, to activate trunk muscles, with cues for wt shifting, forwarb/backward x 2 with min assist.  Bil adductor squeezes, with resting hands on bolster between LEs x 10 x 2  Stand pivot transfer wc> mat to L with mod assist; pt had difficulty with sit> stand. Reachng activty in sitting to facilitate lengthening/shortening/rotating of trunk muscles.  Poor elongation L trunk noted throughout. Pt reported that he always sleeps on his L side.  Gait training iwht RAFO, RW on level tile x 40' with min/mod assist due to drifting R and posteriorly and narrow BOS, due to hip weakness.  Pt left resting in w/c with needs at hand and seat belt alarm set.    Therapy Documentation Precautions:  Precautions Precautions: Fall Precaution Comments: right hemiplegia, right lean Restrictions Weight Bearing Restrictions: No  Therapy/Group: Individual Therapy  Tamberlyn Midgley 08/06/2018, 10:43 AM

## 2018-08-07 ENCOUNTER — Inpatient Hospital Stay (HOSPITAL_COMMUNITY): Payer: Medicare Other | Admitting: Physical Therapy

## 2018-08-07 ENCOUNTER — Inpatient Hospital Stay (HOSPITAL_COMMUNITY): Payer: Medicare Other | Admitting: Occupational Therapy

## 2018-08-07 ENCOUNTER — Inpatient Hospital Stay (HOSPITAL_COMMUNITY): Payer: Medicare Other

## 2018-08-07 DIAGNOSIS — M75102 Unspecified rotator cuff tear or rupture of left shoulder, not specified as traumatic: Secondary | ICD-10-CM

## 2018-08-07 LAB — BASIC METABOLIC PANEL
Anion gap: 10 (ref 5–15)
BUN: 15 mg/dL (ref 8–23)
CO2: 23 mmol/L (ref 22–32)
Calcium: 8.9 mg/dL (ref 8.9–10.3)
Chloride: 107 mmol/L (ref 98–111)
Creatinine, Ser: 1.02 mg/dL (ref 0.61–1.24)
GFR calc Af Amer: >60 mL/min (ref >60)
GFR calc non Af Amer: >60 mL/min (ref >60)
Glucose, Bld: 108 mg/dL — ABNORMAL HIGH (ref 70–99)
Potassium: 4.2 mmol/L (ref 3.5–5.1)
Sodium: 140 mmol/L (ref 135–145)

## 2018-08-07 LAB — CBC
HCT: 29.1 % — ABNORMAL LOW (ref 39.0–52.0)
Hemoglobin: 8.4 g/dL — ABNORMAL LOW (ref 13.0–17.0)
MCH: 25.2 pg — ABNORMAL LOW (ref 26.0–34.0)
MCHC: 28.9 g/dL — ABNORMAL LOW (ref 30.0–36.0)
MCV: 87.4 fL (ref 80.0–100.0)
Platelets: 497 10*3/uL — ABNORMAL HIGH (ref 150–400)
RBC: 3.33 MIL/uL — ABNORMAL LOW (ref 4.22–5.81)
RDW: 22.2 % — ABNORMAL HIGH (ref 11.5–15.5)
WBC: 6.2 10*3/uL (ref 4.0–10.5)
nRBC: 0 % (ref 0.0–0.2)

## 2018-08-07 MED ORDER — BLOOD PRESSURE CONTROL BOOK
Freq: Once | Status: AC
  Administered 2018-08-07: 19:00:00
  Filled 2018-08-07 (×2): qty 1

## 2018-08-07 MED ORDER — LIDOCAINE HCL 1 % IJ SOLN
5.0000 mL | Freq: Once | INTRAMUSCULAR | Status: AC
  Administered 2018-08-08: 5 mL
  Filled 2018-08-07 (×3): qty 5

## 2018-08-07 MED ORDER — TRIAMCINOLONE ACETONIDE 40 MG/ML IJ SUSP
40.0000 mg | Freq: Once | INTRAMUSCULAR | Status: AC
  Administered 2018-08-08: 40 mg via INTRA_ARTICULAR
  Filled 2018-08-07: qty 1

## 2018-08-07 NOTE — Progress Notes (Signed)
Occupational Therapy Session Note  Patient Details  Name: [name] MRN: [id number] Date of Birth: 11/23/1950  Today's Date: 08/07/2018  Session 1 OT Individual Time Calculation (min): 57 min   Session 2 OT Individual Time: 1330-1400 OT Individual Time Calculation (min): 30 min   Short Term Goals: Week 2:  OT Short Term Goal 1 (Week 2): Pt will complete toilet transfer with consistent min A OT Short Term Goal 2 (Week 2): Pt will complete shower transfer with min A OT Short Term Goal 3 (Week 2): Pt will complete LB dressing with min A  Skilled Therapeutic Interventions/Progress Updates:  Session 1   Pt greeted seated in wc and agreeable to OT treatment session focused on modified bathing/dressing. Worked on functional ambulation in the room with RW and min/mod A. Mod A and verbal cues for safe use of RW when turning to sit on shower chair. Bathing completed with overall min A and pt able to achieve figure 4 position to wash feet. Stand-pivot out of shower with min A. Pt requests to shave. Shaving cream applied with R hand for neuro re-ed. Pt refused to let OT assist with shaving as he wanted it done his way. Pt refused to use L hand and addament about using R. Educated pt on use of L hand for stabilizer 2/2 R UE ataxia for safety. Improved distal control of razor. Worked on dynamic standing balance at the sink with alternating UEs to pull up pants. Min A overall 2/2 posterior LOB. Pt left seated in wc at end of session with needs met.  Pain: Pain Assessment Pain Scale: 0-10 Pain Score: 4  Pain Type: Chronic pain Pain Location: Shoulder Pain Orientation: Left Pain Descriptors / Indicators: Aching Pain Onset: On-going  Session 2 Pt greeted semi-reclined in bed and agreeable to OT treatment session. Squat-pivot to wc with Min A. Worked on shoe donning and AFO with min A. Pt able to coordinate shoe tying without assistance. Worked on standing balance/endurance and R UE coordination  with standing checkers activity on upright board. Pt tolerated standing for 10 mins without rest break with close supervision/CGA. Increased time and effort to place large checkers pieces on board. Pt returned to room at end of session and left seated in wc with safety belt on and needs met.   Therapy Documentation Precautions:  Precautions Precautions: Fall Precaution Comments: right hemiplegia, right lean Restrictions Weight Bearing Restrictions: No Pain: Pain Assessment Pain Scale: 0-10 Pain Score: 4  Pain Type: Chronic pain Pain Location: Shoulder Pain Orientation: Left Pain Descriptors / Indicators: Aching Pain Onset: On-going   Therapy/Group: Individual Therapy   S  08/07/2018, 2:17 PM  

## 2018-08-07 NOTE — Progress Notes (Signed)
Physical Therapy Session Note  Patient Details  Name: [name] MRN: [id number] Date of Birth: 1951-04-03  Today's Date: 08/07/2018 PT Individual Time: 0800-0855 PT Individual Time Calculation (min): 55 min   Short Term Goals: Week 1:  PT Short Term Goal 1 (Week 1): Patient to demonstrate transfers consistently with mod A PT Short Term Goal 1 - Progress (Week 1): Met PT Short Term Goal 2 (Week 1): Patient to perform w/c management with S x 150'. PT Short Term Goal 2 - Progress (Week 1): Met PT Short Term Goal 3 (Week 1): Patient to ambulate with mod A x 50' PT Short Term Goal 3 - Progress (Week 1): Met PT Short Term Goal 4 (Week 1): Patient to demonstrate sitting balance during functional task with S PT Short Term Goal 4 - Progress (Week 1): Met  Skilled Therapeutic Interventions/Progress Updates:    pt performs sit <> stand from edge of bed for changing brief and lower body dressing with min A sit <> stand, min A clothing management.  Gait training with RW and Rt AFO 20', 50' x 2 with min A, improving midline posture during gait, continues to require assistance for balance during Rt stance phase.  Standing pre gait stepping forward, backward and laterally with mod manual facilitation for balance and posture and Rt hip activation.  Standing static balance with UE lifts with min A for static standing balance. W/c mobility with bilat UEs x 150' with supervision.  Pt left in room with alarm set, family present, needs at hand.  Therapy Documentation Precautions:  Precautions Precautions: Fall Precaution Comments: right hemiplegia, right lean Restrictions Weight Bearing Restrictions: No Pain:  pt with no c/o pain, states shoulder is "feeling ok"   Therapy/Group: Individual Therapy  Tavian Callander 08/07/2018, 8:58 AM

## 2018-08-07 NOTE — Progress Notes (Signed)
Speech Language Pathology Daily Session Note  Patient Details  Name: [name] MRN: [id number] Date of Birth: 1951-03-20  Today's Date: 08/07/2018 SLP Individual Time: 1445-1530 SLP Individual Time Calculation (min): 45 min  Short Term Goals: Week 2: SLP Short Term Goal 1 (Week 2): Pt will compensatory memory aids to recall information related to dialy schedule and activities with supervision verbal and visual cues.  SLP Short Term Goal 2 (Week 2): Pt will utilize speech intelligibility strategies with Supervision verbal cues to achieve speech intelligibility to > 90% at the sentence level.  SLP Short Term Goal 3 (Week 2): Pt will demonstrate selective attention in moderately distracting environment for ~ 60 minutes with supervision cues.  SLP Short Term Goal 4 (Week 2): Pt will self-monitor and self-correct errors during functional tasks with Supervision verbal cues.  SLP Short Term Goal 5 (Week 2): Pt will complete semi-complex problem solving tasks with supervision verbal cues.   Skilled Therapeutic Interventions:Skilled ST services focused on cognitive skills. Pt was asleep upon entering room, however easily aroused and agreeable to participate in treatment session. SLP facilitated problem solving, error awareness and recall utilizing believed to be familiar card task, however pt did not recognize task, pt required mod A fad to supervision A verbal cues for problem solving at basic level, max-mod A verbal for problem solving at more complex level, supervision A verbal cues for recall of rules and error awareness. SLP and NT assisted Pt from [facility] to bed, requiring mod phsyical assistance. Pt was left in room with call bell within reach, visitor in room and bed alarm set. Recommend to continue skilled ST services.     Pain Pain Assessment Pain Scale: 0-10 Pain Score: 0-No pain Pain Type: Chronic pain Pain Location: Shoulder Pain Orientation: Left Pain Descriptors / Indicators:  Aching Pain Onset: On-going  Therapy/Group: Individual Therapy  [name]  [facility] 08/07/2018, 3:34 PM

## 2018-08-07 NOTE — Progress Notes (Signed)
[place] PHYSICAL MEDICINE & REHABILITATION PROGRESS NOTE   Subjective/Complaints: Pt with various complaints. Discussed shoulder this morning. Pain is "all the time"  ROS: Patient denies fever, rash, sore throat, blurred vision, nausea, vomiting, diarrhea, cough, shortness of breath or chest pain,  headache, or mood change.   Objective:   No results found. Recent Labs    08/07/18 0642  WBC 6.2  HGB 8.4*  HCT 29.1*  PLT 497*   Recent Labs    08/07/18 0642  NA 140  K 4.2  CL 107  CO2 23  GLUCOSE 108*  BUN 15  CREATININE 1.02  CALCIUM 8.9    Intake/Output Summary (Last 24 hours) at 08/07/2018 0906 Last data filed at 08/07/2018 0631 Gross per 24 hour  Intake 1022 ml  Output 600 ml  Net 422 ml     Physical Exam: Vital Signs Blood pressure 138/70, pulse 69, temperature 98.3 F (36.8 C), resp. rate 17, height 5\' 9"  (1.753 m), weight 72.1 kg, SpO2 98 %.  Constitutional: No distress . Vital signs reviewed. HEENT: EOMI, oral membranes moist Neck: supple Cardiovascular: RRR without murmur. No JVD    Respiratory: CTA Bilaterally without wheezes or rales. Normal effort    GI: BS +, non-tender, non-distended  Musc: left shoulder still tender although PROM seems improved Neurological: Fairly alert, dysarthric Follows basic commands. Fair insight.   Motor:  LUE: 2+/5 prox to 4-/5 distally --left shoulder pain  LLE: HF, KE 3+/5, ADF 4/5--no change Skin: Skin is warm and dry.  Multiple healed lesions on shins Psychiatric: irritable   Assessment/Plan: 1. Functional deficits secondary to left thalamic hemorrhage which require 3+ hours per day of interdisciplinary therapy in a comprehensive inpatient rehab setting.  Physiatrist is providing close team supervision and 24 hour management of active medical problems listed below.  Physiatrist and rehab team continue to assess barriers to discharge/monitor patient progress toward functional and medical goals  Care  Tool:  Bathing  Bathing activity did not occur: Refused Body parts bathed by patient: Right arm, Left arm, Chest, Abdomen, Front perineal area, Right upper leg, Left upper leg, Face   Body parts bathed by helper: Buttocks, Right lower leg, Left lower leg     Bathing assist Assist Level: Moderate Assistance - Patient 50 - 74%     Upper Body Dressing/Undressing Upper body dressing Upper body dressing/undressing activity did not occur (including orthotics): Refused What is the patient wearing?: Pull over shirt    Upper body assist Assist Level: Minimal Assistance - Patient > 75%    Lower Body Dressing/Undressing Lower body dressing    Lower body dressing activity did not occur: Refused What is the patient wearing?: Pants, Incontinence brief     Lower body assist Assist for lower body dressing: Moderate Assistance - Patient 50 - 74%     Toileting Toileting Toileting Activity did not occur (Clothing management and hygiene only): N/A (no void or bm)  Toileting assist Assist for toileting: Moderate Assistance - Patient 50 - 74%     Transfers Chair/bed transfer  Transfers assist     Chair/bed transfer assist level: Moderate Assistance - Patient 50 - 74%     Locomotion Ambulation   Ambulation assist      Assist level: Moderate Assistance - Patient 50 - 74% Assistive device: Walker-rolling Max distance: 40   Walk 10 feet activity   Assist     Assist level: Moderate Assistance - Patient - 50 - 74% Assistive device: Walker-rolling, Orthosis  Walk 50 feet activity   Assist Walk 50 feet with 2 turns activity did not occur: Safety/medical concerns         Walk 150 feet activity   Assist Walk 150 feet activity did not occur: Safety/medical concerns         Walk 10 feet on uneven surface  activity   Assist Walk 10 feet on uneven surfaces activity did not occur: Safety/medical concerns         Wheelchair     Assist Will patient use  wheelchair at discharge?: No Type of Wheelchair: Manual    Wheelchair assist level: Supervision/Verbal cueing Max wheelchair distance: 140ft    Wheelchair 50 feet with 2 turns activity    Assist    Wheelchair 50 feet with 2 turns activity did not occur: Safety/medical concerns   Assist Level: Supervision/Verbal cueing   Wheelchair 150 feet activity     Assist Wheelchair 150 feet activity did not occur: Refused         Medical Problem List and Plan: 1.  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  -Continue CIR therapies including PT, OT, and SLP    2.  DVT Prophylaxis/Anticoagulation: Pharmaceutical: Lovenox 3.  History of chronic back pain/headaches/pain Management: Tylenol as needed for now  -c/o significant left shoulder pain ("since first stroke"), suspect DJD/RTC/hemiplegic shoulder pain.    -will inject shoulder tomorrow morning 1/21    4. Mood: LCSW to follow for evaluation and support  -added low dose xanax prn 5. Neuropsych: This patient is not fully capable of making decisions on his own behalf. 6. Skin/Wound Care: Routine pressure relief measures 7. Fluids/Electrolytes/Nutrition: encourage PO  -I personally reviewed the patient's labs today.   8.  HTN: Monitor blood pressures. Continue amlodipine and lisinopril-- continue to titrate medications to control.   Hydralazine 10 nightly started on 1/12 with some improvement, consider twice daily dosing    9.  Acute on chronic blood loss anemia/HH with gastritis:   hgb up to 8.4 1/20!!!  Hemoccult positive  -Resumed iron supplement  -iron studies all c/w iron deficiency anemia  Appreciate GI notes, infusion ordered on 1/10 with repeat in 1 week  -no other intervention recommended at present 10. Chronic Insomnia/sleep state misperception:    Ttrazodone 100 mg nightly was ineffective--resumed home regimen of trazodone 50mg   and Ambien 5 mg--->pharmacy  is limiting him to only 5 mg due to his age.  However patient uses 10 mg at home.  -resumed 50mg  seroquel with 5mg  ambien-this regimen seems to be working the best so far although he's infrequently content 11. Tobacco dependence:  -not a nicotine patch candidate  -Continue trial of wellbutrin 75mg  daily      LOS: 12 days A FACE TO FACE EVALUATION WAS PERFORMED  Meredith Staggers 08/07/2018, 9:06 AM

## 2018-08-08 ENCOUNTER — Inpatient Hospital Stay (HOSPITAL_COMMUNITY): Payer: Medicare Other | Admitting: Occupational Therapy

## 2018-08-08 ENCOUNTER — Inpatient Hospital Stay (HOSPITAL_COMMUNITY): Payer: Medicare Other | Admitting: Physical Therapy

## 2018-08-08 ENCOUNTER — Inpatient Hospital Stay (HOSPITAL_COMMUNITY): Payer: Medicare Other | Admitting: Speech Pathology

## 2018-08-08 NOTE — Progress Notes (Signed)
Speech Language Pathology Daily Session Note  Patient Details  Name: [name] MRN: [id number] Date of Birth: 1950-10-26  Today's Date: 08/08/2018 SLP Individual Time: 1007-1219 SLP Individual Time Calculation (min): 55 min  Short Term Goals: Week 2: SLP Short Term Goal 1 (Week 2): Pt will compensatory memory aids to recall information related to dialy schedule and activities with supervision verbal and visual cues.  SLP Short Term Goal 2 (Week 2): Pt will utilize speech intelligibility strategies with Supervision verbal cues to achieve speech intelligibility to > 90% at the sentence level.  SLP Short Term Goal 3 (Week 2): Pt will demonstrate selective attention in moderately distracting environment for ~ 60 minutes with supervision cues.  SLP Short Term Goal 4 (Week 2): Pt will self-monitor and self-correct errors during functional tasks with Supervision verbal cues.  SLP Short Term Goal 5 (Week 2): Pt will complete semi-complex problem solving tasks with supervision verbal cues.   Skilled Therapeutic Interventions: Skilled treatment session focused on cognitive goals. SLP facilitated session by providing Mod A verbal cues for utilization of external memory aids to maximize recall and carryover of functional information. Patient continues to require encouragement and positive reinforcement in order to demonstrate emergent awareness of progress and requires Mod A multimodal cues to identify/generate activities that he can complete at home. Patient handed off to PT. Continue with current plan of care.      Pain Pain Assessment Pain Scale: 0-10 Pain Score: 2  Pain Location: Shoulder Pain Orientation: Left Pain Descriptors / Indicators: Aching Pain Intervention(s): Repositioned;Shower  Therapy/Group: Individual Therapy  Amandalee Lacap 08/08/2018, 3:43 PM

## 2018-08-08 NOTE — Progress Notes (Signed)
[place] PHYSICAL MEDICINE & REHABILITATION PROGRESS NOTE   Subjective/Complaints: Pt complaining of left shoulder pain. Up with SLP already.   ROS: Patient denies fever, rash, sore throat, blurred vision, nausea, vomiting, diarrhea, cough, shortness of breath or chest pain,  headache, or mood change.   Objective:   No results found. Recent Labs    08/07/18 0642  WBC 6.2  HGB 8.4*  HCT 29.1*  PLT 497*   Recent Labs    08/07/18 0642  NA 140  K 4.2  CL 107  CO2 23  GLUCOSE 108*  BUN 15  CREATININE 1.02  CALCIUM 8.9    Intake/Output Summary (Last 24 hours) at 08/08/2018 0857 Last data filed at 08/08/2018 0543 Gross per 24 hour  Intake -  Output 575 ml  Net -575 ml     Physical Exam: Vital Signs Blood pressure (!) 147/61, pulse (!) 59, temperature 98.1 F (36.7 C), resp. rate 18, height 5\' 9"  (1.753 m), weight 72.1 kg, SpO2 97 %.  Constitutional: No distress . Vital signs reviewed. HEENT: EOMI, oral membranes moist Neck: supple Cardiovascular: RRR without murmur. No JVD    Respiratory: CTA Bilaterally without wheezes or rales. Normal effort    GI: BS +, non-tender, non-distended  Musc: left shoulder tender with PROM. Impingement tests.  Neurological: Fairly alert, dysarthric Follows basic commands. Fair insight.   Motor:  LUE: 2+/5 prox to 4-/5 distally --left shoulder pain inhibits LLE: HF, KE 3+/5, ADF 4/5--stable Skin: Skin is warm and dry.  Multiple healed lesions on shins Psychiatric: pleasantly irritable   Assessment/Plan: 1. Functional deficits secondary to left thalamic hemorrhage which require 3+ hours per day of interdisciplinary therapy in a comprehensive inpatient rehab setting.  Physiatrist is providing close team supervision and 24 hour management of active medical problems listed below.  Physiatrist and rehab team continue to assess barriers to discharge/monitor patient progress toward functional and medical goals  Care  Tool:  Bathing  Bathing activity did not occur: Refused Body parts bathed by patient: Right arm, Left arm, Chest, Abdomen, Front perineal area, Right upper leg, Left upper leg, Face   Body parts bathed by helper: Buttocks, Right lower leg, Left lower leg     Bathing assist Assist Level: Moderate Assistance - Patient 50 - 74%     Upper Body Dressing/Undressing Upper body dressing Upper body dressing/undressing activity did not occur (including orthotics): Refused What is the patient wearing?: Pull over shirt    Upper body assist Assist Level: Minimal Assistance - Patient > 75%    Lower Body Dressing/Undressing Lower body dressing    Lower body dressing activity did not occur: Refused What is the patient wearing?: Pants, Incontinence brief     Lower body assist Assist for lower body dressing: Moderate Assistance - Patient 50 - 74%     Toileting Toileting Toileting Activity did not occur (Clothing management and hygiene only): N/A (no void or bm)  Toileting assist Assist for toileting: Moderate Assistance - Patient 50 - 74%     Transfers Chair/bed transfer  Transfers assist     Chair/bed transfer assist level: Moderate Assistance - Patient 50 - 74%     Locomotion Ambulation   Ambulation assist      Assist level: Moderate Assistance - Patient 50 - 74% Assistive device: Walker-rolling Max distance: 40   Walk 10 feet activity   Assist     Assist level: Moderate Assistance - Patient - 50 - 74% Assistive device: Walker-rolling, Orthosis   Walk  50 feet activity   Assist Walk 50 feet with 2 turns activity did not occur: Safety/medical concerns         Walk 150 feet activity   Assist Walk 150 feet activity did not occur: Safety/medical concerns         Walk 10 feet on uneven surface  activity   Assist Walk 10 feet on uneven surfaces activity did not occur: Safety/medical concerns         Wheelchair     Assist Will patient use  wheelchair at discharge?: No Type of Wheelchair: Manual    Wheelchair assist level: Supervision/Verbal cueing Max wheelchair distance: 111ft    Wheelchair 50 feet with 2 turns activity    Assist    Wheelchair 50 feet with 2 turns activity did not occur: Safety/medical concerns   Assist Level: Supervision/Verbal cueing   Wheelchair 150 feet activity     Assist Wheelchair 150 feet activity did not occur: Refused         Medical Problem List and Plan: 1.  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  -Continue CIR therapies including PT, OT, and SLP  -Interdisciplinary Team Conference today      2.  DVT Prophylaxis/Anticoagulation: Pharmaceutical: Lovenox 3.  History of chronic back pain/headaches/pain Management: Tylenol as needed for now  -c/o significant left shoulder pain ("since first stroke"), suspect DJD/RTC/hemiplegic shoulder pain.    After informed consent and preparation of the skin with betadine and isopropyl alcohol, I injected 40mg  kenalog and 4cc of 1% lidocaine into  the left subacromial space via lateral approach. Additionally, aspiration was performed prior to injection. The patient tolerated well, and no complications were encountered. Afterward the area was cleaned and dressed. Post- injection instructions were provided.     4. Mood: LCSW to follow for evaluation and support  -continue low dose xanax prn 5. Neuropsych: This patient is not fully capable of making decisions on his own behalf. 6. Skin/Wound Care: Routine pressure relief measures 7. Fluids/Electrolytes/Nutrition: encourage PO  -I personally reviewed the patient's labs today.   8.  HTN: Monitor blood pressures. Continue amlodipine and lisinopril-- continue to titrate medications to control.   Hydralazine 10 nightly started on 1/12 with some improvement, consider twice daily dosing    9.  Acute on chronic blood loss anemia/HH with  gastritis:   hgb up to 8.4 1/20!!!  Hemoccult positive  -continue iron supplement  -iron studies all c/w iron deficiency anemia  Appreciate GI notes, infusion ordered x 2  -no other intervention recommended at present 10. Chronic Insomnia/sleep state misperception:    Ttrazodone 100 mg nightly was ineffective--resumed home regimen of trazodone 50mg   and Ambien 5 mg--->pharmacy is limiting him to only 5 mg due to his age.  However patient uses 10 mg at home.  -continue 50mg  seroquel with 5mg  ambien-this regimen seems to be working the best so far although he's infrequently content 11. Tobacco dependence:  -not a nicotine patch candidate  -Continue trial of wellbutrin 75mg  daily      LOS: 13 days A FACE TO FACE EVALUATION WAS PERFORMED  Meredith Staggers 08/08/2018, 8:57 AM

## 2018-08-08 NOTE — Progress Notes (Signed)
Physical Therapy Session Note  Patient Details  Name: Christoph Copelan MRN: [id number] Date of Birth: 1950-09-10  Today's Date: 08/08/2018 PT Individual Time: 0830-0927 PT Individual Time Calculation (min): 57 min   Short Term Goals: Week 2:  PT Short Term Goal 1 (Week 2): pt will consistently perform functional transfers with min A PT Short Term Goal 2 (Week 2): pt will perform gait x 50' in controlled environment with min A  Skilled Therapeutic Interventions/Progress Updates:    pt in w/c eating breakfast.  Rt UE coordination with eating cereal with use of foam grip on spoon with increased time but pt able to eat cereal with minimal spilling.  Pt frustrated by difficulties with handwriting.  PT encouraged pt to use foam grip and to practice handwriting during breaks from therapy.  Gait with RW 40' x 2 with initial mod A to stand, fading to min A during gait with manual facilitation for wt shifts and posture.  Sit <> stand training with focus on forward wt shifts with min A, cues for UE placement.  Stair negotiation with 2 handrails with mod A for 4 steps.  Attempt step ups with 1 handrail to simulate pt's home entry, pt requires mod/max A. Discussed using handicap accessible entrance and pt states this is a possibility.  nustep x 6 minutes level 3 for AAROM bilat UE/LE.  Pt requires mod A for squat pivot transfer due to fatigue at end of session. Pt left in room with family present, needs at hand, alarm set.  Therapy Documentation Precautions:  Precautions Precautions: Fall Precaution Comments: right hemiplegia, right lean Restrictions Weight Bearing Restrictions: No Pain: Pain Assessment Pain Scale: 0-10 Pain Score: 2  Pain Type: Chronic pain Pain Location: Shoulder Pain Orientation: Left;Right Pain Descriptors / Indicators: Aching Pain Frequency: Constant Pain Onset: On-going Patients Stated Pain Goal: 3 Pain Intervention(s): meds given prior to session, repositioned as  needed   Therapy/Group: Individual Therapy  Alecsander Hattabaugh 08/08/2018, 9:29 AM

## 2018-08-08 NOTE — Progress Notes (Signed)
Occupational Therapy Session Note  Patient Details  Name: [name] MRN: [id number] Date of Birth: Nov 20, 1950  Today's Date: 08/08/2018 OT Individual Time: 1300-1430 OT Individual Time Calculation (min): 90 min    Short Term Goals: Week 2:  OT Short Term Goal 1 (Week 2): Pt will complete toilet transfer with consistent min A OT Short Term Goal 2 (Week 2): Pt will complete shower transfer with min A OT Short Term Goal 3 (Week 2): Pt will complete LB dressing with min A  Skilled Therapeutic Interventions/Progress Updates:    Patient in bed after lunch today.  He is pleasant, cooperative and ready for therapy session.   Shower/ADL:  Bathing - min A for buttocks, CS otherwise, Toileting - min A for pants up, max A for hygiene, Grooming - seated w/c level with mod A shaving, mod I for combing hair and washing hands, UB dressing with set up, LB dressing - min A for incontinence brief only (no pullups available), footwear - CS for non-skid slipper socks.   Functional transfers - SPT to/from toilet, mat, w/c without AD min A and max cues for pace, posture and safe technique, SPT to/from bed, shower seat, w/c with RW CG and min cues for weight shift and to make a full turn before sitting.  Bed mobility with CS.  Ambulation in room with RW min A for weight shift and min cues for safety. Therapeutic activity to include handwriting activities - worked on fluid motion, pressure for shading, tracing, using various types of pens/pencils, provided exercises for completion on his own in the room.   Completed posture/core exercises - tolerated unsupported sitting on mat for 25 minutes with S.  R UE GMC exercises, Left shoulder light ROM activities for pain control.  Good effort t/o session with fair carryover.  Patient remained seated in w/c at close of session with seat belt alarm set.    Therapy Documentation Precautions:  Precautions Precautions: Fall Precaution Comments: right hemiplegia, right  lean Restrictions Weight Bearing Restrictions: No General:   Vital Signs:  Pain: Pain Assessment Pain Scale: 0-10 Pain Score: 2  Pain Location: Shoulder Pain Orientation: Left Pain Descriptors / Indicators: Aching Pain Intervention(s): Repositioned;Shower   Therapy/Group: Individual Therapy  Carlos Levering 08/08/2018, 3:38 PM

## 2018-08-09 ENCOUNTER — Inpatient Hospital Stay (HOSPITAL_COMMUNITY): Payer: Medicare Other

## 2018-08-09 ENCOUNTER — Inpatient Hospital Stay (HOSPITAL_COMMUNITY): Payer: Medicare Other | Admitting: Speech Pathology

## 2018-08-09 ENCOUNTER — Inpatient Hospital Stay (HOSPITAL_COMMUNITY): Payer: Medicare Other | Admitting: Physical Therapy

## 2018-08-09 ENCOUNTER — Inpatient Hospital Stay (HOSPITAL_COMMUNITY): Payer: Medicare Other | Admitting: Occupational Therapy

## 2018-08-09 NOTE — Progress Notes (Addendum)
Physical Therapy Session Note  Patient Details  Name: [name] MRN: [id number] Date of Birth: [birth year]  Today's Date: 08/09/2018 PT Individual Time: 1300-1405 PT Individual Time Calculation (min): 65 min   Short Term Goals: Week 2:  PT Short Term Goal 1 (Week 2): pt will consistently perform functional transfers with min A PT Short Term Goal 2 (Week 2): pt will perform gait x 50' in controlled environment with min A  Skilled Therapeutic Interventions/Progress Updates:    Patient in w/c in room, assisted to don shoes mod A for L AFO.  Propelled w/c x 100' with bilat UE's till c/o shoulder pain.  Sit to stand in ortho gym min to mod A ambulated with RW mod A x 30' to perform car transfer with mod A to higher seat.  Patient ambulated to mat 30' again for Berg balance assessment as noted below.  Discussed fall risk and need for continued work to progress gait and balance for safety.  Patient assisted in w/c to gym and negotiated 4 steps with rails and mod A.  Performed standing endurance activity playing checkers x 10 min standing with minguard and intermittent 1 vs. 2 UE support.  Assisted back to room in w/c and to transfer to bed mod A with bed alarm active and all needs in reach.   Therapy Documentation Precautions:  Precautions Precautions: Fall Precaution Comments: right hemiplegia, right lean Restrictions Weight Bearing Restrictions: No Pain: Pain Assessment Pain Score: 8  Pain Location: Neck Pain Onset: On-going Pain Intervention(s): RN made aware;Repositioned  Balance: Standardized Balance Assessment Standardized Balance Assessment: Berg Balance Test Berg Balance Test Sit to Stand: Needs minimal aid to stand or to stabilize Standing Unsupported: Able to stand 2 minutes with supervision Sitting with Back Unsupported but Feet Supported on Floor or Stool: Able to sit safely and securely 2 minutes Stand to Sit: Controls descent by using hands Transfers: Needs one  person to assist Standing Unsupported with Eyes Closed: Able to stand 10 seconds with supervision Standing Ubsupported with Feet Together: Needs help to attain position and unable to hold for 15 seconds From Standing, Reach Forward with Outstretched Arm: Can reach forward >5 cm safely (2") From Standing Position, Pick up Object from Floor: Able to pick up shoe, needs supervision From Standing Position, Turn to Look Behind Over each Shoulder: Needs assist to keep from losing balance and falling Turn 360 Degrees: Needs assistance while turning Standing Unsupported, Alternately Place Feet on Step/Stool: Needs assistance to keep from falling or unable to try Standing Unsupported, One Foot in Front: Able to take small step independently and hold 30 seconds Standing on One Leg: Unable to try or needs assist to prevent fall Total Score: 22   Therapy/Group: Individual Therapy  Reginia Naas  [place], [place] 08/09/2018  08/09/2018, 5:27 PM

## 2018-08-09 NOTE — Progress Notes (Signed)
Speech Language Pathology Daily Session Note  Patient Details  Name: Maxon Kresse MRN: [id number] Date of Birth: 1951-01-10  Today's Date: 08/09/2018 SLP Individual Time: 0920-1000 SLP Individual Time Calculation (min): 40 min  Short Term Goals: Week 2: SLP Short Term Goal 1 (Week 2): Pt will compensatory memory aids to recall information related to dialy schedule and activities with supervision verbal and visual cues.  SLP Short Term Goal 2 (Week 2): Pt will utilize speech intelligibility strategies with Supervision verbal cues to achieve speech intelligibility to > 90% at the sentence level.  SLP Short Term Goal 3 (Week 2): Pt will demonstrate selective attention in moderately distracting environment for ~ 60 minutes with supervision cues.  SLP Short Term Goal 4 (Week 2): Pt will self-monitor and self-correct errors during functional tasks with Supervision verbal cues.  SLP Short Term Goal 5 (Week 2): Pt will complete semi-complex problem solving tasks with supervision verbal cues.   Skilled Therapeutic Interventions: Skilled treatment session focused on cognitive goals. SLP facilitated session by providing education to the patient in regards to strategies to utilize at home to maximize recall and carryover of new information while at home. Patient read the strategies aloud and required Mod A verbal cues for how to implement the strategies at home but was perseverative on not utilizing a routine at home because he can't sleep and how he perceives no one is helping him with these issues while in the hospital. Patient left upright in wheelchair with alarm on and all needs within reach. Continue with current plan of care.      Pain No/Denies Pain  Therapy/Group: Individual Therapy  Emiley Digiacomo 08/09/2018, 12:46 PM

## 2018-08-09 NOTE — Progress Notes (Signed)
PHYSICAL MEDICINE & REHABILITATION PROGRESS NOTE   Subjective/Complaints: Patient in bed with tach assisting.  Complaining of left shoulder pain and that the shoulder still hurts.  ROS: Patient denies fever, rash, sore throat, blurred vision, nausea, vomiting, diarrhea, cough, shortness of breath or chest pain,   back pain, headache, or mood change.    Objective:   No results found. Recent Labs    08/07/18 0642  WBC 6.2  HGB 8.4*  HCT 29.1*  PLT 497*   Recent Labs    08/07/18 0642  NA 140  K 4.2  CL 107  CO2 23  GLUCOSE 108*  BUN 15  CREATININE 1.02  CALCIUM 8.9    Intake/Output Summary (Last 24 hours) at 08/09/2018 0906 Last data filed at 08/09/2018 0015 Gross per 24 hour  Intake 240 ml  Output 625 ml  Net -385 ml     Physical Exam: Vital Signs Blood pressure (!) 143/96, pulse 76, temperature 98.1 F (36.7 C), resp. rate 16, height 5\' 9"  (1.753 m), weight 72.1 kg, SpO2 100 %.  Constitutional: No distress . Vital signs reviewed. HEENT: EOMI, oral membranes moist Neck: supple Cardiovascular: RRR without murmur. No JVD    Respiratory: CTA Bilaterally without wheezes or rales. Normal effort    GI: BS +, non-tender, non-distended  Musc: l left shoulder painful to passive range of motion Neurological: Fairly alert, dysarthric Follows basic commands. Fair insight.   Motor:  LUE: 2+/5 prox to 4-/5 distally  LLE: HF, KE 3+/5, ADF 4/5--stable Skin: Skin is warm and dry.  Multiple healed lesions on shins Psychiatric: pleasantly irritable   Assessment/Plan: 1. Functional deficits secondary to left thalamic hemorrhage which require 3+ hours per day of interdisciplinary therapy in a comprehensive inpatient rehab setting.  Physiatrist is providing close team supervision and 24 hour management of active medical problems listed below.  Physiatrist and rehab team continue to assess barriers to discharge/monitor patient progress toward functional and  medical goals  Care Tool:  Bathing  Bathing activity did not occur: Refused Body parts bathed by patient: Right arm, Left arm, Chest, Abdomen, Front perineal area, Right upper leg, Left upper leg, Face, Left lower leg, Right lower leg   Body parts bathed by helper: Buttocks     Bathing assist Assist Level: Minimal Assistance - Patient > 75%     Upper Body Dressing/Undressing Upper body dressing Upper body dressing/undressing activity did not occur (including orthotics): Refused What is the patient wearing?: Pull over shirt    Upper body assist Assist Level: Set up assist    Lower Body Dressing/Undressing Lower body dressing    Lower body dressing activity did not occur: Refused What is the patient wearing?: Incontinence brief, Pants     Lower body assist Assist for lower body dressing: Minimal Assistance - Patient > 75%     Toileting Toileting Toileting Activity did not occur (Clothing management and hygiene only): N/A (no void or bm)  Toileting assist Assist for toileting: Moderate Assistance - Patient 50 - 74%     Transfers Chair/bed transfer  Transfers assist     Chair/bed transfer assist level: Moderate Assistance - Patient 50 - 74%     Locomotion Ambulation   Ambulation assist      Assist level: Minimal Assistance - Patient > 75% Assistive device: Walker-rolling Max distance: 40   Walk 10 feet activity   Assist     Assist level: Minimal Assistance - Patient > 75% Assistive device: Walker-rolling, Orthosis  Walk 50 feet activity   Assist Walk 50 feet with 2 turns activity did not occur: Safety/medical concerns         Walk 150 feet activity   Assist Walk 150 feet activity did not occur: Safety/medical concerns         Walk 10 feet on uneven surface  activity   Assist Walk 10 feet on uneven surfaces activity did not occur: Safety/medical concerns         Wheelchair     Assist Will patient use wheelchair at  discharge?: No Type of Wheelchair: Manual    Wheelchair assist level: Supervision/Verbal cueing Max wheelchair distance: 127ft    Wheelchair 50 feet with 2 turns activity    Assist    Wheelchair 50 feet with 2 turns activity did not occur: Safety/medical concerns   Assist Level: Supervision/Verbal cueing   Wheelchair 150 feet activity     Assist Wheelchair 150 feet activity did not occur: Refused         Medical Problem List and Plan: 1.  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  -Continue CIR therapies including PT, OT, and SLP       2.  DVT Prophylaxis/Anticoagulation: Pharmaceutical: Lovenox 3.  History of chronic back pain/headaches/pain Management: Tylenol as needed for now  -Patient status post left shoulder injection/subacromial yesterday.  Explained to the patient that may be another day or 2 before he starts experiencing relief.  Even so, it is not a cure for his pain.   4. Mood: LCSW to follow for evaluation and support  -continue low dose xanax prn 5. Neuropsych: This patient is not fully capable of making decisions on his own behalf. 6. Skin/Wound Care: Routine pressure relief measures 7. Fluids/Electrolytes/Nutrition: encourage PO     8.  HTN: Monitor blood pressures. Continue amlodipine and lisinopril-- continue to titrate medications to control.   Hydralazine 10 nightly started on 1/12 with some improvement, consider twice daily dosing    9.  Acute on chronic blood loss anemia/HH with gastritis:   hgb up to 8.4 1/20!!!  Hemoccult positive   -continue iron supplement  -iron studies all c/w iron deficiency anemia  Appreciate GI notes, infusion ordered x 2  -no other intervention recommended at present 10. Chronic Insomnia/sleep state misperception:    Ttrazodone 100 mg nightly was ineffective--resumed home regimen of trazodone 50mg   and Ambien 5 mg--->pharmacy is limiting him to only 5 mg  due to his age.  However patient uses 10 mg at home.  -continue 50mg  seroquel with 5mg  ambien-this regimen seems to be working the best so far although he's infrequently content 11. Tobacco dependence:  -not a nicotine patch candidate  -Continue trial of wellbutrin 75mg  daily      LOS: 14 days A FACE TO FACE EVALUATION WAS PERFORMED  Meredith Staggers 08/09/2018, 9:06 AM

## 2018-08-09 NOTE — Progress Notes (Signed)
Occupational Therapy Session Note  Patient Details  Name: [name] MRN: [id number] Date of Birth: 06/15/1951  Today's Date: 08/09/2018 OT Individual Time: 1115-1200 OT Individual Time Calculation (min): 45 min    Short Term Goals: Week 1:  OT Short Term Goal 1 (Week 1): Pt will complete toilet transfer with mod A of 1 OT Short Term Goal 1 - Progress (Week 1): Met OT Short Term Goal 2 (Week 1): Pt will complete sit<>stand within BADL task with mod A OT Short Term Goal 2 - Progress (Week 1): Met OT Short Term Goal 3 (Week 1): Pt will use R UE for 50% of bathing tasks OT Short Term Goal 3 - Progress (Week 1): Met Week 2:  OT Short Term Goal 1 (Week 2): Pt will complete toilet transfer with consistent min A OT Short Term Goal 2 (Week 2): Pt will complete shower transfer with min A OT Short Term Goal 3 (Week 2): Pt will complete LB dressing with min A      Skilled Therapeutic Interventions/Progress Updates:    Pt seen for BADL training of toileting, shower, dressing with a focus on R side coordination, balance.  Pt completed stand pivot to toilet with min- mod A using bar and was able to pull pants down and cleanse with CGA.  He transferred to w/c to move into shower.  Showered with close S in sitting and CGA in standing .   Moved back to wc to dress, only needing min A with balance as he donned lower body clothing over hips.  Pt's lunch tray arrived and he was ready for lunch.  No A needed for setting up tray.  Pt in room with belt alarm on and all needs met.  Therapy Documentation Precautions:  Precautions Precautions: Fall Precaution Comments: right hemiplegia, right lean Restrictions Weight Bearing Restrictions: No  Pain: no c/o pain     Therapy/Group: Individual Therapy  [place] 08/09/2018, 12:42 PM

## 2018-08-09 NOTE — Progress Notes (Signed)
Physical Therapy Session Note  Patient Details  Name: [name] MRN: [id number] Date of Birth: 1950-11-05  Today's Date: 08/09/2018 PT Individual Time: 0815-0905 PT Individual Time Calculation (min): 50 min   Short Term Goals: Week 1:  PT Short Term Goal 1 (Week 1): Patient to demonstrate transfers consistently with mod A PT Short Term Goal 1 - Progress (Week 1): Met PT Short Term Goal 2 (Week 1): Patient to perform w/c management with S x 150'. PT Short Term Goal 2 - Progress (Week 1): Met PT Short Term Goal 3 (Week 1): Patient to ambulate with mod A x 50' PT Short Term Goal 3 - Progress (Week 1): Met PT Short Term Goal 4 (Week 1): Patient to demonstrate sitting balance during functional task with S PT Short Term Goal 4 - Progress (Week 1): Met Week 2:  PT Short Term Goal 1 (Week 2): pt will consistently perform functional transfers with min A PT Short Term Goal 2 (Week 2): pt will perform gait x 50' in controlled environment with min A  Skilled Therapeutic Interventions/Progress Updates:   Pt received supine in bed, and upset that his breakfast had not arrived yet. Nutrition representative found and then present to explain kitchen error and take order for meals. Pt then agreeble to PT. Supine>sit transfer with supervision assist.   Sit<>stand with min assist to pull pants to waist. Stand pivot with mod assist to WC with RW.   Pt reports not liking AFO. Gait training with GRF AFO x 37f with min056moassist. PT applied Blue rocker AFO   Dynamic gait training in Parallel bars. Forward/backward 2 x 81f33fith min assist overall. Side stepping R and L 2 x 81ft45fth min assist from PT. Mod cues for posture, step length, step width, sequecning of movements from UE to LE   WC mobility x 100ft65fh supervision assist and moderate cues for improved use of the RUE to avoid obstacles on the R side.   [facility] returned to room and [facility] and all needs met.          Therapy Documentation Precautions:  Precautions Precautions: Fall Precaution Comments: right hemiplegia, right lean Restrictions Weight Bearing Restrictions: No General: PT Amount of Missed Time (min): 10 Minutes PT Missed Treatment Reason: Patient unwilling to participate Pain: 0/10 at rest    Therapy/Group: Individual Therapy  AustiLorie Phenix/2020, 9:07 AM

## 2018-08-10 ENCOUNTER — Inpatient Hospital Stay (HOSPITAL_COMMUNITY): Payer: Medicare Other | Admitting: Occupational Therapy

## 2018-08-10 ENCOUNTER — Inpatient Hospital Stay (HOSPITAL_COMMUNITY): Payer: Medicare Other | Admitting: Physical Therapy

## 2018-08-10 ENCOUNTER — Inpatient Hospital Stay (HOSPITAL_COMMUNITY): Payer: Medicare Other | Admitting: Speech Pathology

## 2018-08-10 ENCOUNTER — Inpatient Hospital Stay (HOSPITAL_COMMUNITY): Payer: Medicare Other

## 2018-08-10 NOTE — Progress Notes (Signed)
Physical Therapy Session Note  Patient Details  Name: [name] MRN: [id number] Date of Birth: Dec 10, 1950  Today's Date: 08/10/2018 PT Individual Time: 0800-0900 PT Individual Time Calculation (min): 60 min   Short Term Goals: Week 2:  PT Short Term Goal 1 (Week 2): pt will consistently perform functional transfers with min A PT Short Term Goal 2 (Week 2): pt will perform gait x 50' in controlled environment with min A  Skilled Therapeutic Interventions/Progress Updates:    Patient received sitting at EOB, pleasant and willing to work with therapy. Able to complete sit to stand transfer with ModA, stand-pivot transfer with MinA and RW to [facility], then transported to hallway outside of gym [place] in [facility]. Continued gait training with RW and MinA/Mod VC today, able to ambulate 86f and 915fwith RW but with gait mechanics progressively worsening in quality as fatigue increased, including poor toe clearance even with AFO and poor stance time/swing through on affected side. Otherwise worked on standing balance and tolerance in front of dynavision with min guard and U UE support, forced use of R UE with min guard for balance and as fatigued increased, MaxA for sit to stand transfers. Finished session with seated NMR/forced use activities for R UE with clothes pins and progressive difficulty of pins as well as cross-midline reaching with R UE. He was returned to his room totalA with all needs met, [facility].   Therapy Documentation Precautions:  Precautions Precautions: Fall Precaution Comments: right hemiplegia, right lean Restrictions Weight Bearing Restrictions: No General:   Vital Signs:  Pain: Pain Assessment Pain Scale: 0-10 Pain Score: 0-No pain    Therapy/Group: Individual Therapy  KrDeniece ReeT, DPT, CBIS  Supplemental Physical Therapist [facility]  Pager [phone] Office [phone] 08/10/2018, 12:38 PM

## 2018-08-10 NOTE — Progress Notes (Signed)
Speech Language Pathology Weekly Progress Note  Patient Details  Name: [name] MRN: [id number] Date of Birth: May 10, 1951  Beginning of progress report period: August 03, 2018 End of progress report period: August 10, 2018  Short Term Goals: Week 2: SLP Short Term Goal 1 (Week 2): Pt will compensatory memory aids to recall information related to dialy schedule and activities with supervision verbal and visual cues.  SLP Short Term Goal 1 - Progress (Week 2): Not met SLP Short Term Goal 2 (Week 2): Pt will utilize speech intelligibility strategies with Supervision verbal cues to achieve speech intelligibility to > 90% at the sentence level.  SLP Short Term Goal 2 - Progress (Week 2): Met SLP Short Term Goal 3 (Week 2): Pt will demonstrate selective attention in moderately distracting environment for ~ 60 minutes with supervision cues.  SLP Short Term Goal 3 - Progress (Week 2): Not met SLP Short Term Goal 4 (Week 2): Pt will self-monitor and self-correct errors during functional tasks with Supervision verbal cues.  SLP Short Term Goal 4 - Progress (Week 2): Met SLP Short Term Goal 5 (Week 2): Pt will complete semi-complex problem solving tasks with supervision verbal cues.  SLP Short Term Goal 5 - Progress (Week 2): Not met    New Short Term Goals: Week 3: SLP Short Term Goal 1 (Week 3): Pt will compensatory memory aids to recall information related to dialy schedule and activities with supervision verbal and visual cues.  SLP Short Term Goal 2 (Week 3): Pt will demonstrate selective attention in moderately distracting environment for ~ 60 minutes with supervision cues.  SLP Short Term Goal 3 (Week 3): Pt will complete semi-complex problem solving tasks with supervision verbal cues.  SLP Short Term Goal 4 (Week 3): Pt will self-monitor and self-correct errors during functional tasks with Mod I.  SLP Short Term Goal 5 (Week 3): Pt will utilize speech intelligibility strategies with  Mod I to achieve speech intelligibility to > 90% at the sentence level.   Weekly Progress Updates:  Patient has made functional gains and has met 2 of 5 STGs this reporting period. Currently, patient requires overall supervision level verbal cues for use of speech intelligibility strategies to achieve ~90% intelligibility at the sentence level. Patient also continues to require overall Min A verbal cues for complex problem solving, attention, and recall with use of external aids but demonstrates improved error awareness with functional tasks. Patient education ongoing. Patient would benefit from continued skilled SLP intervention to maximize his cognitive functioning and speech intelligibility prior to discharge.      Intensity: Minumum of 1-2 x/day, 30 to 90 minutes Frequency: 3 to 5 out of 7 days Duration/Length of Stay: 1/31 Treatment/Interventions: Cognitive remediation/compensation;Internal/external aids;Cueing hierarchy;Therapeutic Activities;Functional tasks;Patient/family education;Speech/Language facilitation;Environmental controls    [place], [place] 08/10/2018, 6:38 AM

## 2018-08-10 NOTE — Progress Notes (Signed)
Occupational Therapy Session Note  Patient Details  Name: [name] MRN: [id number] Date of Birth: Jun 23, 1951  Today's Date: 08/10/2018 OT Individual Time: 1330-1400 OT Individual Time Calculation (min): 30 min    Short Term Goals: Week 2:  OT Short Term Goal 1 (Week 2): Pt will complete toilet transfer with consistent min A OT Short Term Goal 2 (Week 2): Pt will complete shower transfer with min A OT Short Term Goal 3 (Week 2): Pt will complete LB dressing with min A  Skilled Therapeutic Interventions/Progress Updates:    1;1. Pt received asleep in bed, reporting pain in L shoulder, however RN just placed patch for pain relief that has lowered pt pain level. Pt stand pivot transfer throughout session min overall. Pt completes 5 sit to stand/standing trials at high/low table working on equal weight bearing in BLE, midline orientation and bimanual task while making no sew blankets tying cutting fabric/tying knots. Pt requires min increasing to Mod A for standing balance and VC for R lean. Exited session with pt in bed, call light in reach and all needs met  Therapy Documentation Precautions:  Precautions Precautions: Fall Precaution Comments: right hemiplegia, right lean Restrictions Weight Bearing Restrictions: No General:   Vital Signs:  Pain: Pain Assessment Pain Scale: 0-10 Pain Score: 0-No pain ADL: ADL Eating: Set up Grooming: Setup Upper Body Bathing: Minimal assistance Lower Body Bathing: Moderate assistance Upper Body Dressing: Minimal assistance Lower Body Dressing: Maximal assistance Toilet Transfer: Maximal assistance Vision   Perception    Praxis   Exercises:   Other Treatments:     Therapy/Group: Individual Therapy  Tonny [place] 08/10/2018, 1:59 PM

## 2018-08-10 NOTE — Progress Notes (Signed)
Occupational Therapy Weekly Progress Note  Patient Details  Name: [name] MRN: [id number] Date of Birth: Feb 20, 1951  Beginning of progress report period: July 27, 2018 End of progress report period: August 10, 2018  Today's Date: 08/10/2018 OT Concurrent Time: 1103-1200 OT Concurrent Time Calculation (min): 57 min   Patient has met 3 of 3 short term goals. Pt is making steady progress towards OT goals. Pt is completing stand-pivot transfers with a more consistent min A, although occasionally needs Mod if he is fatigued. Pt is also progressing within BADL tasks at an overall min A level. R UE strength and coordination continue to improve as well.  Patient continues to demonstrate the following deficits: muscle weakness, decreased activity tolerance, unbalanced muscle activation, ataxia, decreased coordination and decreased motor planning, decreased problem solving, decreased safety awareness and decreased memory and decreased sitting balance, decreased standing balance, decreased postural control, hemiplegia and decreased balance strategies and therefore will continue to benefit from skilled OT intervention to enhance overall performance with BADL and Reduce care partner burden.  Patient progressing toward long term goals..  Continue plan of care.  OT Short Term Goals Week 2:  OT Short Term Goal 1 (Week 2): Pt will complete toilet transfer with consistent min A OT Short Term Goal 1 - Progress (Week 2): Met OT Short Term Goal 2 (Week 2): Pt will complete shower transfer with min A OT Short Term Goal 2 - Progress (Week 2): Met OT Short Term Goal 3 (Week 2): Pt will complete LB dressing with min A OT Short Term Goal 3 - Progress (Week 2): Met Week 3:  OT Short Term Goal 1 (Week 3): STG=LTG 2/2 ELOS  Skilled Therapeutic Interventions/Progress Updates:    Pt greeted semi-reclined in bed and agreeable to OT treatment session. Stand-pivot to wc with min A. Worked on standing balance  and endurance within card playing activity with another pt . Also worked on problem solving and R hand coordination within card playing. Pt tolerated 5 mins standing at longest bout and completed sit<>stands with min A and increased time to achieve full hip extension. Pt returned to room and left seated EOB with lunch set-up and bed alarm on.  Therapy Documentation Precautions:  Precautions Precautions: Fall Precaution Comments: right hemiplegia, right lean Restrictions Weight Bearing Restrictions: No Pain: Pain Assessment Pain Scale: 0-10 Pain Score: 0-No pain   Therapy/Group: Individual Therapy  Valma Cava 08/10/2018, 5:06 PM

## 2018-08-10 NOTE — Progress Notes (Signed)
Speech Language Pathology Daily Session Note  Patient Details  Name: [name] MRN: [id number] Date of Birth: 21-Sep-1950  Today's Date: 08/10/2018 SLP Individual Time: 9480-1655 SLP Individual Time Calculation (min): 31 min  Short Term Goals: Week 3: SLP Short Term Goal 1 (Week 3): Pt will compensatory memory aids to recall information related to dialy schedule and activities with supervision verbal and visual cues.  SLP Short Term Goal 2 (Week 3): Pt will demonstrate selective attention in moderately distracting environment for ~ 60 minutes with supervision cues.  SLP Short Term Goal 3 (Week 3): Pt will complete semi-complex problem solving tasks with supervision verbal cues.  SLP Short Term Goal 4 (Week 3): Pt will self-monitor and self-correct errors during functional tasks with Mod I.  SLP Short Term Goal 5 (Week 3): Pt will utilize speech intelligibility strategies with Mod I to achieve speech intelligibility to > 90% at the sentence level.   Skilled Therapeutic Interventions:   Pt was seen for skilled ST targeting cognitive goals.  Pt was sitting up in wheelchair and verbalized feeling frustrated that no one had come to get him back in bed after his morning therapy session.  Pt was agreeable to participating in therapy with encouragement.  Attempted to discuss memory compensatory strategies with pt as part of ongoing pt education; however, pt became irritated, stating "I'm tired of talking about these and I'm tired of reading these because reading is hard for me."  SLP then facilitated the session with a previously targeted task addressing use of memory compensatory strategies. Pt needed min cues to generate and recall word-picture associations as well as to recall specifically named category members.  Pt was returned to bed at the end of today's therapy session and left with bed alarm set and call bell within reach.  Continue per current plan of care.     Pain Pain Assessment Pain  Scale: 0-10 Pain Score: 0-No pain  Therapy/Group: Individual Therapy  [name], Selinda Orion 08/10/2018, 1:25 PM

## 2018-08-10 NOTE — Progress Notes (Signed)
[place] PHYSICAL MEDICINE & REHABILITATION PROGRESS NOTE   Subjective/Complaints: Pt up eating breakfast. Left arm still sore, perhaps a little better. Still feels tight  ROS: Patient denies fever, rash, sore throat, blurred vision, nausea, vomiting, diarrhea, cough, shortness of breath or chest pain,  back pain, headache, or mood change.   Objective:   No results found. No results for input(s): WBC, HGB, HCT, PLT in the last 72 hours. No results for input(s): NA, K, CL, CO2, GLUCOSE, BUN, CREATININE, CALCIUM in the last 72 hours.  Intake/Output Summary (Last 24 hours) at 08/10/2018 0906 Last data filed at 08/10/2018 0836 Gross per 24 hour  Intake 600 ml  Output 1775 ml  Net -1175 ml     Physical Exam: Vital Signs Blood pressure 135/66, pulse (!) 57, temperature 98.7 F (37.1 C), temperature source Oral, resp. rate 14, height 5\' 9"  (1.753 m), weight 92.2 kg, SpO2 100 %.  Constitutional: No distress . Vital signs reviewed. HEENT: EOMI, oral membranes moist Neck: supple Cardiovascular: RRR without murmur. No JVD    Respiratory: CTA Bilaterally without wheezes or rales. Normal effort    GI: BS +, non-tender, non-distended  Musc:Left shoulder moves more easily. Still limited with abduction Neurological: Fairly alert, dysarthric Follows basic commands. Fair insight.   Motor:  LUE: 2+/5 prox to 4-/5 distally  LLE: HF, KE 3+/5, ADF 4/5--stable Skin: Skin is warm and dry.  Multiple healed lesions on shins--stable Psychiatric: pleasant today   Assessment/Plan: 1. Functional deficits secondary to left thalamic hemorrhage which require 3+ hours per day of interdisciplinary therapy in a comprehensive inpatient rehab setting.  Physiatrist is providing close team supervision and 24 hour management of active medical problems listed below.  Physiatrist and rehab team continue to assess barriers to discharge/monitor patient progress toward functional and medical goals  Care  Tool:  Bathing  Bathing activity did not occur: Refused Body parts bathed by patient: Right arm, Left arm, Chest, Abdomen, Front perineal area, Right upper leg, Left upper leg, Face, Left lower leg, Right lower leg, Buttocks   Body parts bathed by helper: Buttocks     Bathing assist Assist Level: Contact Guard/Touching assist     Upper Body Dressing/Undressing Upper body dressing Upper body dressing/undressing activity did not occur (including orthotics): Refused What is the patient wearing?: Pull over shirt    Upper body assist Assist Level: Set up assist    Lower Body Dressing/Undressing Lower body dressing    Lower body dressing activity did not occur: Refused What is the patient wearing?: Incontinence brief, Pants     Lower body assist Assist for lower body dressing: Minimal Assistance - Patient > 75%     Toileting Toileting Toileting Activity did not occur (Clothing management and hygiene only): N/A (no void or bm)  Toileting assist Assist for toileting: Moderate Assistance - Patient 50 - 74%     Transfers Chair/bed transfer  Transfers assist     Chair/bed transfer assist level: Moderate Assistance - Patient 50 - 74%     Locomotion Ambulation   Ambulation assist      Assist level: Moderate Assistance - Patient 50 - 74% Assistive device: Walker-rolling Max distance: 30'   Walk 10 feet activity   Assist     Assist level: Moderate Assistance - Patient - 50 - 74% Assistive device: Walker-rolling   Walk 50 feet activity   Assist Walk 50 feet with 2 turns activity did not occur: Safety/medical concerns  Assist level: Moderate Assistance - Patient -  50 - 74% Assistive device: Walker-rolling    Walk 150 feet activity   Assist Walk 150 feet activity did not occur: Safety/medical concerns         Walk 10 feet on uneven surface  activity   Assist Walk 10 feet on uneven surfaces activity did not occur: Safety/medical concerns          Wheelchair     Assist Will patient use wheelchair at discharge?: No Type of Wheelchair: Manual    Wheelchair assist level: Supervision/Verbal cueing Max wheelchair distance: 14ft    Wheelchair 50 feet with 2 turns activity    Assist    Wheelchair 50 feet with 2 turns activity did not occur: Safety/medical concerns   Assist Level: Supervision/Verbal cueing   Wheelchair 150 feet activity     Assist Wheelchair 150 feet activity did not occur: Refused         Medical Problem List and Plan: 1.  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  -Continue CIR therapies including PT, OT, and SLP       2.  DVT Prophylaxis/Anticoagulation: Pharmaceutical: Lovenox 3.  History of chronic back pain/headaches/pain Management: Tylenol as needed for now  -Patient status post left shoulder injection/subacromial. Some improvement in  Pain but ROM still limited. 4. Mood: LCSW to follow for evaluation and support  -continue low dose xanax prn 5. Neuropsych: This patient is not fully capable of making decisions on his own behalf. 6. Skin/Wound Care: Routine pressure relief measures 7. Fluids/Electrolytes/Nutrition: encourage PO     8.  HTN: Monitor blood pressures. Continue amlodipine and lisinopril-- continue to titrate medications to control.   Hydralazine 10 nightly started on 1/12 with some improvement, consider twice daily dosing    9.  Acute on chronic blood loss anemia/HH with gastritis:   hgb up to 8.4 1/20!!!  Hemoccult positive   -continue iron supplement  -iron studies all c/w iron deficiency anemia  Appreciate GI notes, infusion ordered x 2  -no other intervention recommended at present 10. Chronic Insomnia/sleep state misperception:    Ttrazodone 100 mg nightly was ineffective--resumed home regimen of trazodone 50mg   and Ambien 5 mg--->pharmacy is limiting him to only 5 mg due to his age.  However patient  uses 10 mg at home.  -continue 50mg  seroquel with 5mg  ambien-this regimen seems to be working the best so far although he's infrequently content 11. Tobacco dependence:  -not a nicotine patch candidate  -Continue trial of wellbutrin 75mg  daily      LOS: 15 days A FACE TO FACE EVALUATION WAS PERFORMED  Meredith Staggers 08/10/2018, 9:06 AM

## 2018-08-10 NOTE — Patient Care Conference (Signed)
Inpatient RehabilitationTeam Conference and Plan of Care Update Date: 08/08/2018   Time: 2:50 PM    Patient Name: [name]      Medical Record Number: [id number]  Date of Birth: [birth year] Sex: Male         Room/Bed: 4W11C/4W11C-01 Payor Info: Payor: [employer] MEDICARE / Plan: [employer] MEDICARE / Product Type: *No Product type* /    Admitting Diagnosis: L thalamus ich  Admit Date/Time:  07/26/2018  5:42 PM Admission Comments: No comment available   Primary Diagnosis:  <principal problem not specified> Principal Problem: <principal problem not specified>  Patient Active Problem List   Diagnosis Date Noted  . Acute on chronic anemia   . Pain   . Agitation 07/26/2018  . Hepatitis C 07/26/2018  . Nontraumatic thalamic hemorrhage ([place]) 07/26/2018  . ICH (intracerebral hemorrhage) ([place]) 07/20/2018  . Osteoarthritis of left hip 04/06/2018  . [place] ulcer, chronic 01/11/2018  . Microcytic anemia   . Symptomatic anemia 01/10/2018  . Syncope 01/10/2018  . Hiatal hernia with gastroesophageal reflux disease and esophagitis 01/05/2018  . Cigarette nicotine dependence without complication 24/23/5361  . Insomnia 01/05/2018  . Acute pain of left shoulder 11/21/2017  . Anemia, iron deficiency   . Gastrointestinal bleeding 10/24/2015  . Absolute anemia   . Tobacco abuse   . Acute ischemic stroke ([place])   . Acute CVA (cerebrovascular accident) ([place]) 05/31/2015  . Stroke ([place]) 05/31/2015  . Dyspnea 04/10/2014  . HTN (hypertension) 04/10/2014  . Inguinal hernia without mention of obstruction or gangrene, unilateral or unspecified, (not specified as recurrent)-right 01/30/2014    Expected Discharge Date: Expected Discharge Date: 08/18/18  Team Members Present: Physician leading conference: Dr. Alger Simons Social Worker Present: Lennart Pall, LCSW Nurse Present: Dorien Chihuahua, RN PT Present: Roderic Ovens, PT OT Present: Willeen Cass, OT SLP Present: Weston Anna,  SLP PPS Coordinator present : Gunnar Fusi     Current Status/Progress Goal Weekly Team Focus  Medical   Patient with ongoing issues with sleep as well as anxiety.  Sleeping better than what he realizes however.  Left shoulder remains a problem  Continued sleep restoration, pain control, secondary stroke prophylaxis  See prior.  Left shoulder steroid injection to help   Bowel/Bladder   continent of bowel continent (incontinent at times of urine) LBM 01/19  continent of b/b normal bowel pattern  assess q shift toilet q 2h   Swallow/Nutrition/ Hydration             ADL's   Min A transfers, Min A BADLs  Supervision overall  R NMR, bathihng/dressing, activity tolerance, UB strengthening, standing balance/endurance   Mobility   min A transfers and gait with RW  supervision transfers, min A gait  NMR, endurance, balance, gait   Communication   Supervision-Min A  Supervision   use of intelligibility strategies    Safety/Cognition/ Behavioral Observations  Min A  Supervision   use of memory strateiges, awareness    Pain   bilateral knee pain Left shoulder pain right neck pain  tylenol, menthol rub lidoderm patches  <=2/10  assess and treat pain q shift and prn   Skin   no skin issues  maintain skin integrity  assess skin q shift and prn    Rehab Goals Patient on target to meet rehab goals: Yes *See Care Plan and progress notes for long and short-term goals.     Barriers to Discharge  Current Status/Progress Possible Resolutions Date Resolved   Physician  Medical stability;Behavior        See above, medical management as we are doing      Nursing                  PT                    OT                  SLP                SW                Discharge Planning/Teaching Needs:  Plan to d/c home with fiance who can provide 24/7 assistance.  Teaching still to be scheduled for next week.   Team Discussion:  MD injected shoulder today.  incont bladder due to urgency.  Min  assist tfs and ADLs and still with supervision goals.  Improved movement in Right UE.  Min assist gait goal and supervision tfs goal.  Revisions to Treatment Plan:  NA    Continued Need for Acute Rehabilitation Level of Care: The patient requires daily medical management by a physician with specialized training in physical medicine and rehabilitation for the following conditions: Daily direction of a multidisciplinary physical rehabilitation program to ensure safe treatment while eliciting the highest outcome that is of practical value to the patient.: Yes Daily medical management of patient stability for increased activity during participation in an intensive rehabilitation regime.: Yes Daily analysis of laboratory values and/or radiology reports with any subsequent need for medication adjustment of medical intervention for : Mood/behavior problems;Neurological problems   I attest that I was present, lead the team conference, and concur with the assessment and plan of the team.   Chenika Nevils 08/10/2018, 3:03 PM

## 2018-08-10 NOTE — Progress Notes (Signed)
Social Work Patient ID: [name], male   DOB: 08/22/1950, 68 y.o.   MRN: [id number]   Have reviewed team conference with pt who is agreeable with continued target d/c date of 1/31 and supervision / min assist goals overall.  Pt might brighter and pleasant today.  Request Advanced for the [facility] at d/c.  Continue to follow.  Damien Batty, LCSW

## 2018-08-11 ENCOUNTER — Inpatient Hospital Stay (HOSPITAL_COMMUNITY): Payer: Medicare Other | Admitting: Physical Therapy

## 2018-08-11 ENCOUNTER — Inpatient Hospital Stay (HOSPITAL_COMMUNITY): Payer: Medicare Other | Admitting: Speech Pathology

## 2018-08-11 ENCOUNTER — Inpatient Hospital Stay (HOSPITAL_COMMUNITY): Payer: Medicare Other

## 2018-08-11 MED ORDER — BUPROPION HCL 75 MG PO TABS
75.0000 mg | ORAL_TABLET | Freq: Two times a day (BID) | ORAL | Status: DC
  Administered 2018-08-11 – 2018-08-18 (×14): 75 mg via ORAL
  Filled 2018-08-11 (×14): qty 1

## 2018-08-11 MED ORDER — CYCLOBENZAPRINE HCL 5 MG PO TABS
5.0000 mg | ORAL_TABLET | Freq: Three times a day (TID) | ORAL | Status: DC | PRN
  Administered 2018-08-11 – 2018-08-17 (×8): 5 mg via ORAL
  Filled 2018-08-11 (×8): qty 1

## 2018-08-11 NOTE — Progress Notes (Signed)
Speech Language Pathology Daily Session Note  Patient Details  Name: [name] MRN: [id number] Date of Birth: May 11, 1951  Today's Date: 08/11/2018 SLP Individual Time: 9987-2158 SLP Individual Time Calculation (min): 45 min  Short Term Goals: Week 3: SLP Short Term Goal 1 (Week 3): Pt will compensatory memory aids to recall information related to dialy schedule and activities with supervision verbal and visual cues.  SLP Short Term Goal 2 (Week 3): Pt will demonstrate selective attention in moderately distracting environment for ~ 60 minutes with supervision cues.  SLP Short Term Goal 3 (Week 3): Pt will complete semi-complex problem solving tasks with supervision verbal cues.  SLP Short Term Goal 4 (Week 3): Pt will self-monitor and self-correct errors during functional tasks with Mod I.  SLP Short Term Goal 5 (Week 3): Pt will utilize speech intelligibility strategies with Mod I to achieve speech intelligibility to > 90% at the sentence level.   Skilled Therapeutic Interventions:    Skilled treatment session focused on cognition goals. SLP facilitated session by providing Min a cues to problem solve game of Connect 4. Additionally, SLP reviewed list of compensatory memory strategies and pt required Min A cues to think of ways to implement. Pt was returned to room, girlfriend present but was preoccupied with phone call. No education was provided.   Pain Pain Assessment Pain Scale: 0-10 Pain Score: 0-No pain  Therapy/Group: Individual Therapy  Terence Bart 08/11/2018, 2:46 PM

## 2018-08-11 NOTE — Progress Notes (Signed)
Occupational Therapy Session Note  Patient Details  Name: [name] MRN: [id number] Date of Birth: 07-Feb-1951  Today's Date: 08/11/2018 OT Individual Time: 3235-5732 OT Individual Time Calculation (min): 58 min    Short Term Goals: Week 2:  OT Short Term Goal 1 (Week 2): Pt will complete toilet transfer with consistent min A OT Short Term Goal 1 - Progress (Week 2): Met OT Short Term Goal 2 (Week 2): Pt will complete shower transfer with min A OT Short Term Goal 2 - Progress (Week 2): Met OT Short Term Goal 3 (Week 2): Pt will complete LB dressing with min A OT Short Term Goal 3 - Progress (Week 2): Met  Skilled Therapeutic Interventions/Progress Updates:    1;1. Pt asleep and easily aroused. Pt reporting pain on L shoudler. Pt declining intervention from RN bcause "it doesn't help" pt completes stand pivot transfer with min A EOB>w/c<>shower chair. tp bathes with touching A only for standing balnace while bathing buttocks. Pt competes UB dressing with setup and LB dressing with steady A and VC for crossing into figure 4 for threading BLE. Pt able to don B socks, L shoe and fasten B shoes. Pt requires A to don R shoe with AFO. In tx gym pt stands to unbutton large and small buttons. Pt unable to fasten in standing d/t increased balacne demands however able to fasten in sitting. Pt educated on importance of doing clothing fasteners seated to improve safety and independence. Pt completes wrist maze for forearm and wrist NMR seated. Exited session with pt seated in bed, call lgiht in reach and all needs met  Therapy Documentation Precautions:  Precautions Precautions: Fall Precaution Comments: right hemiplegia, right lean Restrictions Weight Bearing Restrictions: No General:   Vital Signs:  Pain:   ADL: ADL Eating: Set up Grooming: Setup Upper Body Bathing: Minimal assistance Lower Body Bathing: Moderate assistance Upper Body Dressing: Minimal assistance Lower Body  Dressing: Maximal assistance Toilet Transfer: Maximal assistance Vision   Perception    Praxis   Exercises:   Other Treatments:     Therapy/Group: Individual Therapy  Tonny Branch 08/11/2018, 10:03 AM

## 2018-08-11 NOTE — Progress Notes (Signed)
Physical Therapy Session Note  Patient Details  Name: [name] MRN: [id number] Date of Birth: Oct 17, 1950  Today's Date: 08/11/2018 PT Individual Time: 1030-1115 PT Individual Time Calculation (min): 45 min   Short Term Goals: Week 2:  PT Short Term Goal 1 (Week 2): pt will consistently perform functional transfers with min A PT Short Term Goal 2 (Week 2): pt will perform gait x 50' in controlled environment with min A  Skilled Therapeutic Interventions/Progress Updates:    Patient received up in [facility], fiance present  In room, patient very agitated with PT from the beginning, yelling that he thinks he should be able to transfer and walk in his room with RW and nursing; educated that this PT does not feel that he is safe to do so due to inconsistent levels of assist and being easily fatigued. Patient yelling at PT and not open to discussion but fiance able to process information and agreeable that patient is not necessarily safe to walk with nursing, primary PT aware of situation. Transported patient to PT gym totalA in [facility], then able to transfer to mat table with RW and MinA, performed sit to supine with MinA and worked on strengthening for hip extensors, abductors, and flexors in supine, then performed supine to sit with MinA as well. Able to again perform sit to stand with MinA and gait trained distances of 138f and then 1643fwith RW and MinA, cues to reduce scissoring, improve BOS, and improve swing phase/toe and foot clearance during gait. He was left up in his room with all needs met, seat belt alarm active, and fiance present.   Therapy Documentation Precautions:  Precautions Precautions: Fall Precaution Comments: right hemiplegia, right lean Restrictions Weight Bearing Restrictions: No General:   Vital Signs:  Pain: Pain Assessment Pain Scale: 0-10 Pain Score: 9  Pain Type: Acute pain Pain Location: Shoulder Pain Orientation: Left Pain Descriptors / Indicators:  Aching;Sore Pain Onset: On-going Patients Stated Pain Goal: 0 Pain Intervention(s): Refused(patient refused intervention or PT talking to RN about pain medicine ) Multiple Pain Sites: No    Therapy/Group: Individual Therapy  KrDeniece ReeT, DPT, CBIS  Supplemental Physical Therapist [facility]  Pager [phone] [facility] Office [phone] 08/11/2018, 12:25 PM

## 2018-08-11 NOTE — Progress Notes (Addendum)
PHYSICAL MEDICINE & REHABILITATION PROGRESS NOTE   Subjective/Complaints: Pt up in bed. Sleep remains an issue as does left shoulder  ROS: Patient denies fever, rash, sore throat, blurred vision, nausea, vomiting, diarrhea, cough, shortness of breath or chest pain,  headache, or mood change.   Objective:   No results found. No results for input(s): WBC, HGB, HCT, PLT in the last 72 hours. No results for input(s): NA, K, CL, CO2, GLUCOSE, BUN, CREATININE, CALCIUM in the last 72 hours.  Intake/Output Summary (Last 24 hours) at 08/11/2018 0924 Last data filed at 08/11/2018 0030 Gross per 24 hour  Intake 480 ml  Output 500 ml  Net -20 ml     Physical Exam: Vital Signs Blood pressure (!) 141/75, pulse 68, temperature 97.6 F (36.4 C), temperature source Oral, resp. rate 16, height 5\' 9"  (1.753 m), weight 92.2 kg, SpO2 100 %.  Constitutional: No distress . Vital signs reviewed. HEENT: EOMI, oral membranes moist Neck: supple Cardiovascular: RRR without murmur. No JVD    Respiratory: CTA Bilaterally without wheezes or rales. Normal effort    GI: BS +, non-tender, non-distended  Musc:Left shoulder moves more easily, pain less. Pt self-limiting at times Neurological: Fairly alert, dysarthric Follows basic commands. Fair insight.   Motor:  LUE: 2+/5 prox to 4-/5 distally--stable  LLE: HF, KE 3+/5, ADF 4/5-stable Skin: Skin is warm and dry.  Multiple healed lesions on shins--no change Psychiatric: pleasant today   Assessment/Plan: 1. Functional deficits secondary to left thalamic hemorrhage which require 3+ hours per day of interdisciplinary therapy in a comprehensive inpatient rehab setting.  Physiatrist is providing close team supervision and 24 hour management of active medical problems listed below.  Physiatrist and rehab team continue to assess barriers to discharge/monitor patient progress toward functional and medical goals  Care Tool:  Bathing  Bathing  activity did not occur: Refused Body parts bathed by patient: Right arm, Left arm, Chest, Abdomen, Front perineal area, Right upper leg, Left upper leg, Face, Left lower leg, Right lower leg, Buttocks   Body parts bathed by helper: Buttocks     Bathing assist Assist Level: Contact Guard/Touching assist     Upper Body Dressing/Undressing Upper body dressing Upper body dressing/undressing activity did not occur (including orthotics): Refused What is the patient wearing?: Pull over shirt    Upper body assist Assist Level: Set up assist    Lower Body Dressing/Undressing Lower body dressing    Lower body dressing activity did not occur: Refused What is the patient wearing?: Incontinence brief, Pants     Lower body assist Assist for lower body dressing: Minimal Assistance - Patient > 75%     Toileting Toileting Toileting Activity did not occur (Clothing management and hygiene only): N/A (no void or bm)  Toileting assist Assist for toileting: Moderate Assistance - Patient 50 - 74%     Transfers Chair/bed transfer  Transfers assist     Chair/bed transfer assist level: Minimal Assistance - Patient > 75%     Locomotion Ambulation   Ambulation assist      Assist level: Minimal Assistance - Patient > 75% Assistive device: Walker-rolling Max distance: 79ft   Walk 10 feet activity   Assist     Assist level: Minimal Assistance - Patient > 75% Assistive device: Walker-rolling   Walk 50 feet activity   Assist Walk 50 feet with 2 turns activity did not occur: Safety/medical concerns  Assist level: Minimal Assistance - Patient > 75% Assistive device: Walker-rolling  Walk 150 feet activity   Assist Walk 150 feet activity did not occur: Safety/medical concerns         Walk 10 feet on uneven surface  activity   Assist Walk 10 feet on uneven surfaces activity did not occur: Safety/medical concerns         Wheelchair     Assist Will patient use  wheelchair at discharge?: No Type of Wheelchair: Manual    Wheelchair assist level: Supervision/Verbal cueing Max wheelchair distance: 155ft    Wheelchair 50 feet with 2 turns activity    Assist    Wheelchair 50 feet with 2 turns activity did not occur: Safety/medical concerns   Assist Level: Supervision/Verbal cueing   Wheelchair 150 feet activity     Assist Wheelchair 150 feet activity did not occur: Refused         Medical Problem List and Plan: 1.  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  -Continue CIR therapies including PT, OT, and SLP       2.  DVT Prophylaxis/Anticoagulation: Pharmaceutical: Lovenox 3.  History of chronic back pain/headaches/pain Management: Tylenol as needed for now  -Patient status post left shoulder injection/subacromial. Some improvement in  Pain but ROM still limited. Expressed to patient that he has to have realistic expectations with injection   -can work this up further once he's progressed in stroke recovery 4. Mood: LCSW to follow for evaluation and support  -continue low dose xanax prn 5. Neuropsych: This patient is not fully capable of making decisions on his own behalf. 6. Skin/Wound Care: Routine pressure relief measures 7. Fluids/Electrolytes/Nutrition: encourage PO     8.  HTN: Monitor blood pressures. Continue amlodipine and lisinopril-- continue to titrate medications to control.   Hydralazine 10 nightly started on 1/12 with improvement 9.  Acute on chronic blood loss anemia/HH with gastritis:   hgb up to 8.4 1/20!!!  Hemoccult positive   -continue iron supplement  -iron studies all c/w iron deficiency anemia  Appreciate GI notes, infusion ordered x 2  -no other intervention recommended at present  -recheck labs monday 10. Chronic Insomnia/sleep state misperception:    Ttrazodone 100 mg nightly was ineffective--resumed home regimen of trazodone 50mg   and  Ambien 5 mg--->pharmacy is limiting him to only 5 mg due to his age.  However patient uses 10 mg at home.  -continue 50mg  seroquel with 5mg  ambien-this regimen seems to be working the best so far although he's infrequently content 11. Tobacco dependence:  -not a nicotine patch candidate  -Continue trial of wellbutrin 75mg , increase to bid     LOS: 16 days A FACE TO [place] 08/11/2018, 9:24 AM

## 2018-08-11 NOTE — Progress Notes (Signed)
Physical Therapy Weekly Progress Note  Patient Details  Name: [name] MRN: [id number] Date of Birth: [birth year]  Beginning of progress report period: August 04, 2018 End of progress report period: August 11, 2018  Today's Date: 08/11/2018 PT Individual Time: 1400-1505 PT Individual Time Calculation (min): 65 min   Pt upset about not being allowed to walk with nursing over the weekends and when not in therapy.  PT explained to pt that this is for his safety and nursing is not trained in the same way therapists are.  Pt continues to be upset that he is "trapped in the bed".  PT encouraged pt to propel his w/c around the unit to get out of his room and for a change of scenery.  Nursing on board with this plan.  Pt performs w/c mobility throughout unit with supervision.  Gait training with RW 100' x 2 with min A, increased Rt foot drag when fatigued and with tight turns.  Gait with obstacle negotiation with focus on turns with RW and min A, pt improves control and decreases foot drag with repetition.  Step ups to 4'' step with Rt LE for strengthening 2 x 10 with RW.  Sit <> stand training with focus on equal wt bearing and improving strength 3 x 5.  Pt left in room with fiance present, alarm set, needs at hand.     Patient has met 1 of 2 short term goals.  Pt continues to be inconsistent with transfers, occasionally requiring mod/max A when fatigued, able to perform at supervision level when feeling well. Pt able to perform gait with RW and min A when not fatigued.  Patient continues to demonstrate the following deficits muscle weakness, impaired timing and sequencing, abnormal tone, unbalanced muscle activation, decreased coordination and decreased motor planning, decreased awareness, decreased problem solving, decreased safety awareness, decreased memory and delayed processing and decreased standing balance, decreased postural control, hemiplegia and decreased balance strategies and  therefore will continue to benefit from skilled PT intervention to increase functional independence with mobility.  Patient not progressing toward long term goals.  See goal revision.. Transfers downgraded to min A. Continue plan of care.  PT Short Term Goals Week 2:  PT Short Term Goal 1 (Week 2): pt will consistently perform functional transfers with min A PT Short Term Goal 1 - Progress (Week 2): Progressing toward goal PT Short Term Goal 2 (Week 2): pt will perform gait x 50' in controlled environment with min A PT Short Term Goal 2 - Progress (Week 2): Met Week 3:  PT Short Term Goal 1 (Week 3): = LTG  Skilled Therapeutic Interventions/Progress Updates:  Ambulation/gait training;Stair training;Balance/vestibular training;Cognitive remediation/compensation;Therapeutic Exercise;Patient/family education;Therapeutic Activities;DME/adaptive equipment instruction;UE/LE Coordination activities;UE/LE Strength taining/ROM;Functional mobility training;Wheelchair propulsion/positioning;Functional electrical stimulation;Community reintegration;Discharge planning;Neuromuscular re-education;Splinting/orthotics;Psychosocial support;Pain management   Therapy Documentation Precautions:  Precautions Precautions: Fall Precaution Comments: right hemiplegia, right lean Restrictions Weight Bearing Restrictions: No   Pain: Pt states Lt shoulder is "sore", meds given prior to session, rests as needed   Therapy/Group: Individual Therapy  Velna Hedgecock 08/11/2018, 12:58 PM

## 2018-08-12 ENCOUNTER — Inpatient Hospital Stay (HOSPITAL_COMMUNITY): Payer: Medicare Other | Admitting: Physical Therapy

## 2018-08-12 DIAGNOSIS — K449 Diaphragmatic hernia without obstruction or gangrene: Secondary | ICD-10-CM

## 2018-08-12 DIAGNOSIS — K210 Gastro-esophageal reflux disease with esophagitis: Secondary | ICD-10-CM

## 2018-08-12 NOTE — Progress Notes (Signed)
[name] is a 68 y.o. male [birth year] [id number]  Subjective: Pt feels well today - Muscle relaxer helped cramping last PM  Objective: Vital signs in last 24 hours: Temp:  [98.2 F (36.8 C)-98.8 F (37.1 C)] 98.8 F (37.1 C) (01/25 0336) Pulse Rate:  [61-80] 61 (01/25 0336) Resp:  [17-19] 18 (01/25 0336) BP: (131-155)/(62-74) 131/62 (01/25 0336) SpO2:  [100 %] 100 % (01/25 0336) Weight change:  Last BM Date: 08/11/18  Intake/Output from previous day: 01/24 0701 - 01/25 0700 In: 121 [P.O.:121] Out: 450 [Urine:450]  Physical Exam General: NAD, on edge of bed preparing for OT session at my visit.  Lungs: CTA B - no inc WOB CV: RRR, no edema  Lab Results: BMET    Component Value Date/Time   NA 140 08/07/2018 0642   NA 143 06/23/2017 1413   K 4.2 08/07/2018 0642   CL 107 08/07/2018 0642   CO2 23 08/07/2018 0642   GLUCOSE 108 (H) 08/07/2018 0642   BUN 15 08/07/2018 0642   BUN 14 06/23/2017 1413   CREATININE 1.02 08/07/2018 0642   CALCIUM 8.9 08/07/2018 0642   GFRNONAA >60 08/07/2018 0642   GFRAA >60 08/07/2018 0642   CBC    Component Value Date/Time   WBC 6.2 08/07/2018 0642   RBC 3.33 (L) 08/07/2018 0642   HGB 8.4 (L) 08/07/2018 0642   HCT 29.1 (L) 08/07/2018 0642   HCT 32.1 (L) 01/11/2018 0747   PLT 497 (H) 08/07/2018 0642   MCV 87.4 08/07/2018 0642   MCH 25.2 (L) 08/07/2018 0642   MCHC 28.9 (L) 08/07/2018 0642   RDW 22.2 (H) 08/07/2018 0642   LYMPHSABS 1.3 07/31/2018 0718   MONOABS 0.6 07/31/2018 0718   EOSABS 0.1 07/31/2018 0718   BASOSABS 0.0 07/31/2018 0718   CBG's (last 3):  No results for input(s): GLUCAP in the last 72 hours. LFT's Lab Results  Component Value Date   ALT 19 07/27/2018   AST 17 07/27/2018   ALKPHOS 70 07/27/2018   BILITOT 0.5 07/27/2018    Studies/Results: No results found.  Medications:  I have reviewed the patient's current medications. Scheduled Medications: . amLODipine  10 mg Oral Daily  . buPROPion  75  mg Oral BID  . ferrous sulfate  325 mg Oral Q breakfast  . hydrALAZINE  10 mg Oral QHS  . lidocaine  1 patch Transdermal Q24H  . lisinopril  40 mg Oral Daily  . MUSCLE RUB   Topical BID  . pantoprazole  40 mg Oral BID AC  . QUEtiapine  50 mg Oral QHS  . senna-docusate  1 tablet Oral BID  . zolpidem  5 mg Oral QHS   PRN Medications: acetaminophen, ALPRAZolam, alum & mag hydroxide-simeth, bisacodyl, cloNIDine, cyclobenzaprine, diphenhydrAMINE, guaiFENesin-dextromethorphan, polyethylene glycol, prochlorperazine **OR** prochlorperazine **OR** prochlorperazine, sodium phosphate  Assessment/Plan: Principal Problem:   Nontraumatic thalamic hemorrhage (HCC) Active Problems:   HTN (hypertension)   Tobacco abuse   Hiatal hernia with gastroesophageal reflux disease and esophagitis   Hepatitis C   Acute on chronic anemia   Pain  1. S/p ICH with functional deficits atop prior CVA. Cont med mgmt and CIR therapies with PT, OT and ST as ongoing. 2. HTN - continue med mgmt and titration as needed 3. Pain syndrome PTA - continue non narcotic support including muscle relaxer and Kpad as needed given relief with same last PM 4. ABL anemia with gastritis - continue PPI and f/u CBC Mon as planned. No sx active bleed  at present 5. Tobacco abuse PTA - trial Wellbutin ongoing  Length of stay, days: 17  [name] A. Asa Lente, MD 08/12/2018, 11:45 AM

## 2018-08-12 NOTE — Progress Notes (Signed)
Pt noted very agitated when approached for assessment. Pt reports cramping sensation to right flank area, reports of refusal of tylenol prior to my arrival per day shift nurse. Pt states "I'm not taking no tylenol that is not going to help me, I can go home and hurt if you all aren't going to do nothing!" Pain rated at 8/10. Dr. Asa Lente notified, received new orders for flexeril 5 mg po every 8 hours prn muscles spasms and prn Kpad as needed. Will administer med as ordered and reevaluate.

## 2018-08-12 NOTE — Progress Notes (Signed)
Physical Therapy Session Note  Patient Details  Name: [name] MRN: [id number] Date of Birth: 18-May-1951  Today's Date: 08/12/2018 PT Individual Time: 0900-1000 PT Individual Time Calculation (min): 60 min   Short Term Goals: Week 3:  PT Short Term Goal 1 (Week 3): = LTG  Skilled Therapeutic Interventions/Progress Updates:    patient received in bed, pleasant today and willing to participate in therapy. Able to complete bed mobility with min guard, then able to maintain static sitting balance at EOB with S, performed dynamic tasks at EOB with min guard. Shoes/AFO applied today totalA but patient able to tie shoes with min guard. Transferred to [name] with [name], then transported to hallway [name] in [facility] and able to gait train approximately 163f with RW and MinA, limited by fatigue, cues for reduced scissoring and safe pace of gait. Otherwise focused session on standing with WNL BOS while placing small pegs in holes with R UE, cues for posture and BOS, for forced use of R UE as well as NMR based feedback of appropriate BOS during dynamic activity and lateral weight shifting. Sit to stand mostly with ModA during session but [facility] for forward translation of head. [facility] with seat belt alarm active, all needs otherwise met.   Therapy Documentation Precautions:  Precautions Precautions: Fall Precaution Comments: right hemiplegia, right lean Restrictions Weight Bearing Restrictions: No    Pain: Pain Assessment Pain Scale: 0-10 Pain Score: 7 Pain Type: Acute pain Pain Location: Shoulder Pain Orientation: Left Pain Descriptors / Indicators: Aching Pain Frequency: Constant Pain Onset: On-going Pain Intervention(s):  refused medication/PT speaking to RN for medication     Therapy/Group: Individual Therapy  KDeniece ReePT, DPT, CBIS  Supplemental Physical Therapist [facility]   Pager  [phone] Rehab Office [phone]  08/12/2018, 12:32 PM

## 2018-08-12 NOTE — Progress Notes (Signed)
Patient verbalizes relief of cramps, will continue to monitor.

## 2018-08-12 NOTE — Plan of Care (Signed)
  Problem: Consults Goal: RH STROKE PATIENT EDUCATION Description See Patient Education module for education specifics  Outcome: Progressing   Problem: RH BOWEL ELIMINATION Goal: RH STG MANAGE BOWEL WITH ASSISTANCE Description STG Manage Bowel with [place].  Outcome: Progressing   Problem: RH BLADDER ELIMINATION Goal: RH STG MANAGE BLADDER WITH ASSISTANCE Description STG Manage Bladder With Min Assistance  Outcome: Progressing   Problem: RH SKIN INTEGRITY Goal: RH STG SKIN FREE OF INFECTION/BREAKDOWN Description No new breakdown with min assist   Outcome: Progressing Goal: RH STG MAINTAIN SKIN INTEGRITY WITH ASSISTANCE Description STG Maintain Skin Integrity With [place].  Outcome: Progressing

## 2018-08-12 NOTE — Progress Notes (Signed)
Patients brother spoke with this nurse regarding patients progress.  He is concerned that patient is not as independent as he thought he would be.  He asked about what would happen if the family says they cannot take care of him at home.  Started to ask questions about insurance coverage for long-term rehab facility.  Informed brother that we will meet for team conference Tuesday and determine readiness for discharge and solidify discharge plan based off of needs.  Requested to speak with SW regarding discharge disposition.  Provided brother with SW office number and informed him they will return Monday morning.  Brita Romp, RN

## 2018-08-13 ENCOUNTER — Inpatient Hospital Stay (HOSPITAL_COMMUNITY): Payer: Medicare Other | Admitting: Occupational Therapy

## 2018-08-13 NOTE — Progress Notes (Signed)
Occupational Therapy Session Note  Patient Details  Name: [name] MRN: [id number] Date of Birth: [birth year]  Today's Date: 08/13/2018 OT Individual Time: 0935-1000 OT Individual Time Calculation (min): 25 min    Short Term Goals: Week 3:  OT Short Term Goal 1 (Week 3): STG=LTG 2/2 ELOS  Skilled Therapeutic Interventions/Progress Updates:    Pt seen for OT session focusing on ADL re-training. Pt asleep in supine upon arrival, easily awoken and agreeable to tx session, denying pain. He transferred to EOB with supervision using hospital bed functions. He dressed seated EOB, supervision- CGA for dynamic sitting balance while reaching forward to thread pants. He required mod-max A to stand from EOB to RW and mod initially fading to min A for standing balance while pulling pants up. Multi-modal cuing for achieving upright posture. He returned to EOB to don socks/shoes. Min A to achieve and maintain figure four sitting position while pt threaded on socks with increased time required, able to use B UEs to complete task. Assist for donning R shoe/AFO.  He ambulated to sink using RW, mod A to stand and min A for ambulation with VCs to increase BOS. He stood at sink to complete hand hygiene and brush hair with guarding assist.  Despite education and encouragement to stay sitting up in recliner or w/c, pt adamant to return to bed. Pt transitioned back to bed with min A overall. Pt left in supine at end of session with bed alarm on and all needs in reach.   Therapy Documentation Precautions:  Precautions Precautions: Fall Precaution Comments: right hemiplegia, right lean Restrictions Weight Bearing Restrictions: No Pain:   No/denies pain   Therapy/Group: Individual Therapy  Braya Habermehl L 08/13/2018, 6:41 AM

## 2018-08-13 NOTE — Plan of Care (Signed)
  Problem: Consults Goal: RH STROKE PATIENT EDUCATION Description See Patient Education module for education specifics  Outcome: Progressing   Problem: RH BOWEL ELIMINATION Goal: RH STG MANAGE BOWEL WITH ASSISTANCE Description STG Manage Bowel with [place].  Outcome: Progressing   Problem: RH BLADDER ELIMINATION Goal: RH STG MANAGE BLADDER WITH ASSISTANCE Description STG Manage Bladder With Min Assistance  Outcome: Progressing   Problem: RH SKIN INTEGRITY Goal: RH STG SKIN FREE OF INFECTION/BREAKDOWN Description No new breakdown with min assist   Outcome: Progressing Goal: RH STG MAINTAIN SKIN INTEGRITY WITH ASSISTANCE Description STG Maintain Skin Integrity With [place].  Outcome: Progressing   Problem: RH SAFETY Goal: RH STG ADHERE TO SAFETY PRECAUTIONS W/ASSISTANCE/DEVICE Description STG Adhere to Safety Precautions With min Assistance/Device.  Outcome: Progressing   Problem: RH COGNITION-NURSING Goal: RH STG USES MEMORY AIDS/STRATEGIES W/ASSIST TO PROBLEM SOLVE Description STG Uses Memory Aids/Strategies With Min Assistance to Problem Solve.  Outcome: Progressing Goal: RH STG ANTICIPATES NEEDS/CALLS FOR ASSIST W/ASSIST/CUES Description STG Anticipates Needs/Calls for Assist With Min Assistance/Cues.  Outcome: Progressing   Problem: RH PAIN MANAGEMENT Goal: RH STG PAIN MANAGED AT OR BELOW PT'S PAIN GOAL Description <3 out of 10.   Outcome: Progressing   Problem: Spiritual Needs Goal: Ability to function at adequate level Outcome: Progressing

## 2018-08-13 NOTE — Progress Notes (Signed)
[name] is a 68 y.o. male Mar 22, 1951 [id number]  Subjective: Sleeping but easily awoken. Denies any problems this morning (pain reasonably controlled)  Objective: Vital signs in last 24 hours: Temp:  [97.6 F (36.4 C)-98 F (36.7 C)] 98 F (36.7 C) (01/26 0405) Pulse Rate:  [63-81] 63 (01/26 0405) Resp:  [16-20] 16 (01/26 0405) BP: (120-142)/(61-72) 120/69 (01/26 0405) SpO2:  [98 %-100 %] 98 % (01/26 0405) Weight change:  Last BM Date: 08/12/18  Intake/Output from previous day: 01/25 0701 - 01/26 0700 In: 460 [P.O.:460] Out: 1370 [Urine:1370]  Physical Exam General: NAD  Lungs: CTA B - no inc WOB CV: RRR, no edema  Lab Results: BMET    Component Value Date/Time   NA 140 08/07/2018 0642   NA 143 06/23/2017 1413   K 4.2 08/07/2018 0642   CL 107 08/07/2018 0642   CO2 23 08/07/2018 0642   GLUCOSE 108 (H) 08/07/2018 0642   BUN 15 08/07/2018 0642   BUN 14 06/23/2017 1413   CREATININE 1.02 08/07/2018 0642   CALCIUM 8.9 08/07/2018 0642   GFRNONAA >60 08/07/2018 0642   GFRAA >60 08/07/2018 0642   CBC    Component Value Date/Time   WBC 6.2 08/07/2018 0642   RBC 3.33 (L) 08/07/2018 0642   HGB 8.4 (L) 08/07/2018 0642   HCT 29.1 (L) 08/07/2018 0642   HCT 32.1 (L) 01/11/2018 0747   PLT 497 (H) 08/07/2018 0642   MCV 87.4 08/07/2018 0642   MCH 25.2 (L) 08/07/2018 0642   MCHC 28.9 (L) 08/07/2018 0642   RDW 22.2 (H) 08/07/2018 0642   LYMPHSABS 1.3 07/31/2018 0718   MONOABS 0.6 07/31/2018 0718   EOSABS 0.1 07/31/2018 0718   BASOSABS 0.0 07/31/2018 0718   CBG's (last 3):  No results for input(s): GLUCAP in the last 72 hours. LFT's Lab Results  Component Value Date   ALT 19 07/27/2018   AST 17 07/27/2018   ALKPHOS 70 07/27/2018   BILITOT 0.5 07/27/2018    Studies/Results: No results found.  Medications:  I have reviewed the patient's current medications. Scheduled Medications: . amLODipine  10 mg Oral Daily  . buPROPion  75 mg Oral BID  . ferrous  sulfate  325 mg Oral Q breakfast  . hydrALAZINE  10 mg Oral QHS  . lidocaine  1 patch Transdermal Q24H  . lisinopril  40 mg Oral Daily  . MUSCLE RUB   Topical BID  . pantoprazole  40 mg Oral BID AC  . QUEtiapine  50 mg Oral QHS  . senna-docusate  1 tablet Oral BID  . zolpidem  5 mg Oral QHS   PRN Medications: acetaminophen, ALPRAZolam, alum & mag hydroxide-simeth, bisacodyl, cloNIDine, cyclobenzaprine, diphenhydrAMINE, guaiFENesin-dextromethorphan, polyethylene glycol, prochlorperazine **OR** prochlorperazine **OR** prochlorperazine, sodium phosphate  Assessment/Plan: Principal Problem:   Nontraumatic thalamic hemorrhage (HCC) Active Problems:   HTN (hypertension)   Tobacco abuse   Hiatal hernia with gastroesophageal reflux disease and esophagitis   Hepatitis C   Acute on chronic anemia   Pain  1. S/p ICH with functional deficits atop prior CVA. Cont med mgmt and CIR therapies with PT, OT and ST as ongoing. 2. HTN - continue med mgmt and titration as needed 3. Pain syndrome PTA - continue non narcotic support including muscle relaxer and Kpad as needed  4. ABL anemia with gastritis - continue PPI and f/u CBC Mon as planned. No sx active bleed at present 5. Tobacco abuse PTA - trial Wellbutin ongoing  Length of stay,  days: 18  Fallou Hulbert A. Asa Lente, MD 08/13/2018, 10:39 AM

## 2018-08-14 ENCOUNTER — Inpatient Hospital Stay (HOSPITAL_COMMUNITY): Payer: Medicare Other | Admitting: Occupational Therapy

## 2018-08-14 ENCOUNTER — Inpatient Hospital Stay (HOSPITAL_COMMUNITY): Payer: Medicare Other | Admitting: Physical Therapy

## 2018-08-14 ENCOUNTER — Inpatient Hospital Stay (HOSPITAL_COMMUNITY): Payer: Medicare Other | Admitting: Speech Pathology

## 2018-08-14 LAB — CBC
HCT: 29.3 % — ABNORMAL LOW (ref 39.0–52.0)
Hemoglobin: 8.5 g/dL — ABNORMAL LOW (ref 13.0–17.0)
MCH: 26.3 pg (ref 26.0–34.0)
MCHC: 29 g/dL — ABNORMAL LOW (ref 30.0–36.0)
MCV: 90.7 fL (ref 80.0–100.0)
Platelets: 417 10*3/uL — ABNORMAL HIGH (ref 150–400)
RBC: 3.23 MIL/uL — ABNORMAL LOW (ref 4.22–5.81)
RDW: 22.7 % — ABNORMAL HIGH (ref 11.5–15.5)
WBC: 5.7 10*3/uL (ref 4.0–10.5)
nRBC: 0 % (ref 0.0–0.2)

## 2018-08-14 LAB — BASIC METABOLIC PANEL
Anion gap: 10 (ref 5–15)
BUN: 18 mg/dL (ref 8–23)
CO2: 19 mmol/L — ABNORMAL LOW (ref 22–32)
Calcium: 8.8 mg/dL — ABNORMAL LOW (ref 8.9–10.3)
Chloride: 109 mmol/L (ref 98–111)
Creatinine, Ser: 1.04 mg/dL (ref 0.61–1.24)
GFR calc Af Amer: >60 mL/min (ref >60)
GFR calc non Af Amer: >60 mL/min (ref >60)
Glucose, Bld: 100 mg/dL — ABNORMAL HIGH (ref 70–99)
Potassium: 3.9 mmol/L (ref 3.5–5.1)
Sodium: 138 mmol/L (ref 135–145)

## 2018-08-14 NOTE — Progress Notes (Signed)
Speech Language Pathology Daily Session Note  Patient Details  Name: [name] MRN: [id number] Date of Birth: May 24, 1951  Today's Date: 08/14/2018 SLP Individual Time: 6761-9509 SLP Individual Time Calculation (min): 45 min  Short Term Goals: Week 3: SLP Short Term Goal 1 (Week 3): Pt will compensatory memory aids to recall information related to dialy schedule and activities with supervision verbal and visual cues.  SLP Short Term Goal 2 (Week 3): Pt will demonstrate selective attention in moderately distracting environment for ~ 60 minutes with supervision cues.  SLP Short Term Goal 3 (Week 3): Pt will complete semi-complex problem solving tasks with supervision verbal cues.  SLP Short Term Goal 4 (Week 3): Pt will self-monitor and self-correct errors during functional tasks with Mod I.  SLP Short Term Goal 5 (Week 3): Pt will utilize speech intelligibility strategies with Mod I to achieve speech intelligibility to > 90% at the sentence level.   Skilled Therapeutic Interventions: Skilled treatment session focused on cognitive goals. Upon arrival, patient awake in bed but agreeable to participate in treatment session. Patient required total A to donn shoes (for time) and transferred to the wheelchair with overall Mod A. SLP facilitated session by providing supervision verbal cues for patient to utilize memory compensatory strategies in order to recall 4 items he needed to locate in the gift shop after a 10 minute delay. Patient independently utilized writing information down and recalled/located all 4 items with Mod I. Patient left upright in wheelchair with all needs within reach and alarm on. Continue with current plan of care.      Pain No/Denies Pain   Therapy/Group: Individual Therapy  [name] 08/14/2018, 2:15 PM

## 2018-08-14 NOTE — Progress Notes (Signed)
Physical Therapy Session Note  Patient Details  Name: [name] MRN: [id number] Date of Birth: 1951-01-27  Today's Date: 08/14/2018 PT Individual Time: 1000-1055 PT Individual Time Calculation (min): 55 min   Short Term Goals: Week 3:  PT Short Term Goal 1 (Week 3): = LTG  Skilled Therapeutic Interventions/Progress Updates:   pt propels w/c throughout unit with supervision with bilat UEs.  Sit <> stand from furniture to simulate home set up with min A for sit <> stand, min A for gait with RW on carpet.  Gait training 100' x 2 with RW and min A and Rt AFO.  Gait with obstacle negotiation with min A, min cuing for safety with tight turns.  nustep x 8 minutes for UE/LE AAROM and strengthening, level 5.  Bed mobility as pt states he will try to sleep in his bed at home. Pt able to perform with supervision and much increased time.  Pt performs gait to bathroom with min A. Toilet transfers and toileting with supervision with use of grab bar.  Pt left in chair with alarm set, needs at hand.   Therapy Documentation Precautions:  Precautions Precautions: Fall Precaution Comments: right hemiplegia, right lean Restrictions Weight Bearing Restrictions: No Pain: Pain Assessment Pain Scale: 0-10 Pain Score: 4  Pain Type: Chronic pain Pain Location: Shoulder Pain Orientation: Left Pain Descriptors / Indicators: Aching Pain Frequency: Constant Pain Onset: On-going Pain Intervention(s): Repositioned   Therapy/Group: Individual Therapy  Sheehan Stacey 08/14/2018, 10:57 AM

## 2018-08-14 NOTE — Progress Notes (Signed)
[place] PHYSICAL MEDICINE & REHABILITATION PROGRESS NOTE   Subjective/Complaints: Pt up working with OT in bathroom.   ROS: Patient denies fever, rash, sore throat, blurred vision, nausea, vomiting, diarrhea, cough, shortness of breath or chest pain,   headache, or mood change. .   Objective:   No results found. Recent Labs    08/14/18 0551  WBC 5.7  HGB 8.5*  HCT 29.3*  PLT 417*   Recent Labs    08/14/18 0551  NA 138  K 3.9  CL 109  CO2 19*  GLUCOSE 100*  BUN 18  CREATININE 1.04  CALCIUM 8.8*    Intake/Output Summary (Last 24 hours) at 08/14/2018 0852 Last data filed at 08/14/2018 0510 Gross per 24 hour  Intake 800 ml  Output 1125 ml  Net -325 ml     Physical Exam: Vital Signs Blood pressure (!) 145/71, pulse 66, temperature 97.7 F (36.5 C), temperature source Oral, resp. rate 20, height 5\' 9"  (1.753 m), weight 92.2 kg, SpO2 100 %.  Constitutional: No distress . Vital signs reviewed. HEENT: EOMI, oral membranes moist Neck: supple Cardiovascular: RRR without murmur. No JVD    Respiratory: CTA Bilaterally without wheezes or rales. Normal effort    GI: BS +, non-tender, non-distended  Musc:Left shoulder moves more easily, pain still present Neurological: Fairly alert, dysarthric Follows basic commands. Fair insight.   Motor:  LUE: 2+/5 prox to 4-/5 distally--no change  LLE: HF, KE 3+/5, ADF 4/5-no chnag Skin: Skin is warm and dry.  Multiple healed lesions on shins--stable Psychiatric: pleasant today   Assessment/Plan: 1. Functional deficits secondary to left thalamic hemorrhage which require 3+ hours per day of interdisciplinary therapy in a comprehensive inpatient rehab setting.  Physiatrist is providing close team supervision and 24 hour management of active medical problems listed below.  Physiatrist and rehab team continue to assess barriers to discharge/monitor patient progress toward functional and medical goals  Care Tool:  Bathing  Bathing activity did not occur: Refused Body parts bathed by patient: Right arm, Left arm, Chest, Abdomen, Front perineal area, Right upper leg, Left upper leg, Face, Left lower leg, Right lower leg, Buttocks   Body parts bathed by helper: Buttocks     Bathing assist Assist Level: Contact Guard/Touching assist     Upper Body Dressing/Undressing Upper body dressing Upper body dressing/undressing activity did not occur (including orthotics): Refused What is the patient wearing?: Pull over shirt    Upper body assist Assist Level: Supervision/Verbal cueing    Lower Body Dressing/Undressing Lower body dressing    Lower body dressing activity did not occur: Refused What is the patient wearing?: Pants     Lower body assist Assist for lower body dressing: Minimal Assistance - Patient > 75%     Toileting Toileting Toileting Activity did not occur (Clothing management and hygiene only): N/A (no void or bm)  Toileting assist Assist for toileting: Moderate Assistance - Patient 50 - 74%     Transfers Chair/bed transfer  Transfers assist     Chair/bed transfer assist level: Moderate Assistance - Patient 50 - 74%     Locomotion Ambulation   Ambulation assist      Assist level: Minimal Assistance - Patient > 75% Assistive device: Walker-rolling Max distance: 154ft   Walk 10 feet activity   Assist     Assist level: Minimal Assistance - Patient > 75% Assistive device: Walker-rolling   Walk 50 feet activity   Assist Walk 50 feet with 2 turns activity did not  occur: Safety/medical concerns  Assist level: Minimal Assistance - Patient > 75% Assistive device: Walker-rolling    Walk 150 feet activity   Assist Walk 150 feet activity did not occur: Safety/medical concerns  Assist level: Minimal Assistance - Patient > 75% Assistive device: Walker-rolling    Walk 10 feet on uneven surface  activity   Assist Walk 10 feet on uneven surfaces activity did not occur:  Safety/medical concerns         Wheelchair     Assist Will patient use wheelchair at discharge?: No Type of Wheelchair: Manual    Wheelchair assist level: Supervision/Verbal cueing Max wheelchair distance: 150ft    Wheelchair 50 feet with 2 turns activity    Assist    Wheelchair 50 feet with 2 turns activity did not occur: Safety/medical concerns   Assist Level: Supervision/Verbal cueing   Wheelchair 150 feet activity     Assist Wheelchair 150 feet activity did not occur: Refused         Medical Problem List and Plan: 1.  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  -Continue CIR therapies including PT, OT, and SLP        2.  DVT Prophylaxis/Anticoagulation: Pharmaceutical: Lovenox 3.  History of chronic back pain/headaches/pain Management: Tylenol as needed for now  -Patient status post left shoulder injection/subacromial. Some improvement in  Pain but ROM still limited. Expressed to patient that he has to have realistic expectations with injection   -can work this up further once he's progressed in stroke recovery 4. Mood: LCSW to follow for evaluation and support  -continue low dose xanax prn 5. Neuropsych: This patient is not fully capable of making decisions on his own behalf. 6. Skin/Wound Care: Routine pressure relief measures 7. Fluids/Electrolytes/Nutrition: encourage PO    -I personally reviewed the patient's labs today.   8.  HTN: Monitor blood pressures. Continue amlodipine and lisinopril-- continue to titrate medications to control.   Hydralazine 10 nightly started on 1/12 with improvement 9.  Acute on chronic blood loss anemia/HH with gastritis:   hgb holding at 8.5 1/27  Hemoccult positive   -continue iron supplement  -iron studies all c/w iron deficiency anemia  Appreciate GI notes, infusion ordered x 2  -no other intervention recommended at present    10. Chronic Insomnia/sleep  state misperception:    Ttrazodone 100 mg nightly was ineffective--resumed home regimen of trazodone 50mg   and Ambien 5 mg--->pharmacy is limiting him to only 5 mg due to his age.  However patient uses 10 mg at home.  -continue 50mg  seroquel with 5mg  ambien-has been most effective 11. Tobacco dependence:  -not a nicotine patch candidate  -Continue trial of wellbutrin 75mg , increased to bid 1/24     LOS: 19 days A FACE TO [place] 08/14/2018, 8:52 AM

## 2018-08-14 NOTE — Progress Notes (Signed)
Orthopedic Tech Progress Note Patient Details:  [name] 1950-10-29 [id number] Called in order  Patient ID: [name], male   DOB: 11/06/1950, 68 y.o.   MRN: [id number]   [name] 08/14/2018, 3:12 PM

## 2018-08-14 NOTE — Progress Notes (Signed)
Occupational Therapy Session Note  Patient Details  Name: [name] MRN: [id number] Date of Birth: August 03, 1950  Today's Date: 08/14/2018 OT Individual Time: 4818-5909 OT Individual Time Calculation (min): 57 min   Short Term Goals: Week 3:  OT Short Term Goal 1 (Week 3): STG=LTG 2/2 ELOS  Skilled Therapeutic Interventions/Progress Updates:    Pt greeted semi-reclined in bed with significant other present and agreeable to OT treatment session. Pt requests to shower today. Stand-pivot transfers complete throughout session with CGA and increased time to facilitate pivot. Pt undressed on tub bench, then completed bathing with min A for balance when standing to wash buttocks. Good use of R UE throughout to grasp wash cloth and wash body parts. Pt requests to wear paper scrubs today with OT assuming pt did not have clean clothes. Noted that pt did have clean clothes and informed pt that paper scrubs are for people who do not have access to clothing. Pt agreeable to wear his own pants. Dressing completed with increased time and CGA for balance when standing to pull up pants. Verbal cues to alternate UEs for improved balance and safety. Pt needed set-up A for breakfast tray, then was able to self-feed without assistance. Pt left seated in wc with safety belt on and needs met.   Therapy Documentation Precautions:  Precautions Precautions: Fall Precaution Comments: right hemiplegia, right lean Restrictions Weight Bearing Restrictions: (P) No Pain: Pain Assessment Pain Scale: 0-10 Pain Score: 4  Pain Type: Chronic pain Pain Location: Shoulder Pain Orientation: Left Pain Descriptors / Indicators: Aching Pain Frequency: Constant Pain Onset: On-going Pain Intervention(s): Repositioned   Therapy/Group: Individual Therapy  Valma Cava 08/14/2018, 9:56 AM

## 2018-08-14 NOTE — Progress Notes (Signed)
Occupational Therapy Session Note  Patient Details  Name: [name] MRN: [id number] Date of Birth: 1950/07/28  Today's Date: 08/14/2018 OT Individual Time: 0900-0930 OT Individual Time Calculation (min): 30 min    Short Term Goals: Week 1:  OT Short Term Goal 1 (Week 1): Pt will complete toilet transfer with mod A of 1 OT Short Term Goal 1 - Progress (Week 1): Met OT Short Term Goal 2 (Week 1): Pt will complete sit<>stand within BADL task with mod A OT Short Term Goal 2 - Progress (Week 1): Met OT Short Term Goal 3 (Week 1): Pt will use R UE for 50% of bathing tasks OT Short Term Goal 3 - Progress (Week 1): Met Week 2:  OT Short Term Goal 1 (Week 2): Pt will complete toilet transfer with consistent min A OT Short Term Goal 1 - Progress (Week 2): Met OT Short Term Goal 2 (Week 2): Pt will complete shower transfer with min A OT Short Term Goal 2 - Progress (Week 2): Met OT Short Term Goal 3 (Week 2): Pt will complete LB dressing with min A OT Short Term Goal 3 - Progress (Week 2): Met Week 3:  OT Short Term Goal 1 (Week 3): STG=LTG 2/2 ELOS  Skilled Therapeutic Interventions/Progress Updates:    Pt seen this session to focus on standing balance and RLE strength. Pt treated in room.   Bed rail raised so pt could use it as support during sit to stand and standing balance using R hand for support. Pt rose to stand with min A and tolerated standing for 2-3 min at a time while working on wt shifting L and R.  Cues for R knee extension without locking out his knee.  During seated rest breaks, R knee ext AROM exercises and RUE AROM focusing on smooth movement patterns of overhead reach to touching back of head.  Pt also worked on small squats 10 reps 2x during session with min A for support with RUE on rail.  Pt tolerated session well with excellent participation.  Pt resting in w/c with chair belt alarm on and all needs met.   Therapy Documentation Precautions:  Precautions Precautions:  Fall Precaution Comments: right hemiplegia, right lean Restrictions Weight Bearing Restrictions: No   Pain: Pain Assessment Pain Scale: 0-10 Pain Score: 4  Pain Type: Chronic pain Pain Location: Shoulder Pain Orientation: Left Pain Descriptors / Indicators: Aching Pain Frequency: Constant Pain Onset: On-going Pain Intervention(s): Repositioned   Therapy/Group: Individual Therapy  [place] 08/14/2018, 12:10 PM

## 2018-08-15 ENCOUNTER — Inpatient Hospital Stay (HOSPITAL_COMMUNITY): Payer: Medicare Other | Admitting: Speech Pathology

## 2018-08-15 ENCOUNTER — Inpatient Hospital Stay (HOSPITAL_COMMUNITY): Payer: Medicare Other | Admitting: Occupational Therapy

## 2018-08-15 ENCOUNTER — Inpatient Hospital Stay (HOSPITAL_COMMUNITY): Payer: Medicare Other | Admitting: Physical Therapy

## 2018-08-15 MED ORDER — TRAMADOL HCL 50 MG PO TABS
50.0000 mg | ORAL_TABLET | Freq: Two times a day (BID) | ORAL | Status: DC | PRN
  Administered 2018-08-15 – 2018-08-18 (×4): 50 mg via ORAL
  Filled 2018-08-15 (×4): qty 1

## 2018-08-15 MED ORDER — ACETAMINOPHEN 325 MG PO TABS
650.0000 mg | ORAL_TABLET | Freq: Three times a day (TID) | ORAL | Status: DC
  Administered 2018-08-15 – 2018-08-18 (×12): 650 mg via ORAL
  Filled 2018-08-15 (×12): qty 2

## 2018-08-15 MED ORDER — MUSCLE RUB 10-15 % EX CREA: 1.0000 "application " | TOPICAL_CREAM | Freq: Two times a day (BID) | CUTANEOUS | 0 refills | Status: DC

## 2018-08-15 NOTE — Progress Notes (Signed)
[place] PHYSICAL MEDICINE & REHABILITATION PROGRESS NOTE   Subjective/Complaints: No new complaints. Left shoulder still painful. Nurse asked what other options we had  ROS: Patient denies fever, rash, sore throat, blurred vision, nausea, vomiting, diarrhea, cough, shortness of breath or chest pain,  headache, or mood change.   Objective:   No results found. Recent Labs    08/14/18 0551  WBC 5.7  HGB 8.5*  HCT 29.3*  PLT 417*   Recent Labs    08/14/18 0551  NA 138  K 3.9  CL 109  CO2 19*  GLUCOSE 100*  BUN 18  CREATININE 1.04  CALCIUM 8.8*    Intake/Output Summary (Last 24 hours) at 08/15/2018 0929 Last data filed at 08/15/2018 0522 Gross per 24 hour  Intake 440 ml  Output 975 ml  Net -535 ml     Physical Exam: Vital Signs Blood pressure (!) 141/72, pulse 61, temperature 98.9 F (37.2 C), resp. rate 18, height 5\' 9"  (1.753 m), weight 92.2 kg, SpO2 100 %.  Constitutional: No distress . Vital signs reviewed. HEENT: EOMI, oral membranes moist Neck: supple Cardiovascular: RRR without murmur. No JVD    Respiratory: CTA Bilaterally without wheezes or rales. Normal effort    GI: BS +, non-tender, non-distended  Musc:Left shoulder moves more easily, pain still present. Can abduct to about 45 Neurological: Fairly alert, dysarthric Follows basic commands. Fair insight.   Motor:  LUE: 2+/5 prox to 4-/5 distally--no change  LLE: HF, KE 3+/5, ADF 4/5-no chnag Skin: Skin is warm and dry.  Multiple healed lesions on shins--stable Psychiatric: flat   Assessment/Plan: 1. Functional deficits secondary to left thalamic hemorrhage which require 3+ hours per day of interdisciplinary therapy in a comprehensive inpatient rehab setting.  Physiatrist is providing close team supervision and 24 hour management of active medical problems listed below.  Physiatrist and rehab team continue to assess barriers to discharge/monitor patient progress toward functional and medical  goals  Care Tool:  Bathing  Bathing activity did not occur: Refused Body parts bathed by patient: Right arm, Left arm, Chest, Abdomen, Front perineal area, Right upper leg, Left upper leg, Face, Left lower leg, Right lower leg, Buttocks   Body parts bathed by helper: Buttocks     Bathing assist Assist Level: Contact Guard/Touching assist     Upper Body Dressing/Undressing Upper body dressing Upper body dressing/undressing activity did not occur (including orthotics): Refused What is the patient wearing?: Pull over shirt    Upper body assist Assist Level: Supervision/Verbal cueing    Lower Body Dressing/Undressing Lower body dressing    Lower body dressing activity did not occur: Refused What is the patient wearing?: Pants     Lower body assist Assist for lower body dressing: Minimal Assistance - Patient > 75%     Toileting Toileting Toileting Activity did not occur (Clothing management and hygiene only): N/A (no void or bm)  Toileting assist Assist for toileting: Moderate Assistance - Patient 50 - 74%     Transfers Chair/bed transfer  Transfers assist     Chair/bed transfer assist level: Moderate Assistance - Patient 50 - 74%     Locomotion Ambulation   Ambulation assist      Assist level: Minimal Assistance - Patient > 75% Assistive device: Walker-rolling Max distance: 172ft   Walk 10 feet activity   Assist     Assist level: Minimal Assistance - Patient > 75% Assistive device: Walker-rolling   Walk 50 feet activity   Assist Walk 50 feet  with 2 turns activity did not occur: Safety/medical concerns  Assist level: Minimal Assistance - Patient > 75% Assistive device: Walker-rolling    Walk 150 feet activity   Assist Walk 150 feet activity did not occur: Safety/medical concerns  Assist level: Minimal Assistance - Patient > 75% Assistive device: Walker-rolling    Walk 10 feet on uneven surface  activity   Assist Walk 10 feet on uneven  surfaces activity did not occur: Safety/medical concerns         Wheelchair     Assist Will patient use wheelchair at discharge?: No Type of Wheelchair: Manual    Wheelchair assist level: Supervision/Verbal cueing Max wheelchair distance: 123ft    Wheelchair 50 feet with 2 turns activity    Assist    Wheelchair 50 feet with 2 turns activity did not occur: Safety/medical concerns   Assist Level: Supervision/Verbal cueing   Wheelchair 150 feet activity     Assist Wheelchair 150 feet activity did not occur: Refused         Medical Problem List and Plan: 1.  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  -Continue CIR therapies including PT, OT, and SLP    --Interdisciplinary Team Conference today      2.  DVT Prophylaxis/Anticoagulation: Pharmaceutical: Lovenox 3.  History of chronic back pain/headaches/pain Management: Tylenol as needed for now  -Patient status post left shoulder injection/subacromial. Some improvement in  Pain but ROM still limited.   -will add tramadol as pain is limiting with activity still 4. Mood: LCSW to follow for evaluation and support  -continue low dose xanax prn 5. Neuropsych: This patient is not fully capable of making decisions on his own behalf. 6. Skin/Wound Care: Routine pressure relief measures 7. Fluids/Electrolytes/Nutrition: encourage PO        8.  HTN: Monitor blood pressures. Continue amlodipine and lisinopril-- continue to titrate medications to control.   Hydralazine 10 nightly started on 1/12 with improvement 9.  Acute on chronic blood loss anemia/HH with gastritis:   hgb holding at 8.5 1/27  Hemoccult positive   -continue iron supplement  -iron studies all c/w iron deficiency anemia  GI consulted, infusion ordered x 2  -no other intervention recommended at present    10. Chronic Insomnia/sleep state misperception:    Ttrazodone 100 mg nightly was  ineffective--resumed home regimen of trazodone 50mg   and Ambien 5 mg--->pharmacy is limiting him to only 5 mg due to his age.  However patient uses 10 mg at home.  -continue 50mg  seroquel with 5mg  [name] been most effective 11. Tobacco dependence:  -not a nicotine patch candidate  -Continue trial of wellbutrin 75mg , increased to bid 1/24     LOS: 20 days A FACE TO [place] 08/15/2018, 9:29 AM

## 2018-08-15 NOTE — Progress Notes (Signed)
Occupational Therapy Session Note  Patient Details  Name: [name] MRN: [id number] Date of Birth: 07/18/1951  Today's Date: 08/15/2018 OT Individual Time: 6834-1962 OT Individual Time Calculation (min): 60 min   Short Term Goals: Week 3:  OT Short Term Goal 1 (Week 3): STG=LTG 2/2 ELOS  Skilled Therapeutic Interventions/Progress Updates:    Pt greeted semi-reclined in bed and requesting to shower today. Stand pivot transfers bed>wc>tub bench>wc with CGA and increased time to manage R LE and facilitate pivot. Bathing completed with set-up A and CGA for balance when standing to wash buttocks. Set-up A to thread pants and underwear, then CGA for balance when standing to pull them up. Worked on technique for donning R AFO, with pt still needing min A to get shoe over heel. Pt then practiced functional ambulation 10 feet x 3 with focus on balance with turning and safe descent into chair. Pt returned to room and left seated in wc with safety belt on and needs met.   Therapy Documentation Precautions:  Precautions Precautions: Fall Precaution Comments: right hemiplegia, right lean Restrictions Weight Bearing Restrictions: No Pain: Pain Assessment Pain Scale: 0-10 Pain Score: 8  Pain Type: Chronic pain Pain Location: Shoulder Pain Orientation: Left Pain Descriptors / Indicators: Aching Pain Frequency: Constant Pain Onset: On-going Pain Intervention(s): Repositioned   Therapy/Group: Individual Therapy  Valma Cava 08/15/2018, 9:47 AM

## 2018-08-15 NOTE — Plan of Care (Signed)
  Problem: RH Balance Goal: LTG Patient will maintain dynamic standing with ADLs (OT) Description LTG:  Patient will maintain dynamic standing balance with assist during activities of daily living (OT)  Flowsheets (Taken 08/15/2018 1435) LTG: Pt will maintain dynamic standing balance during ADLs with: Minimal Assistance - Patient > 75% Note:  Goal downgraded 1/28- ESD   Problem: RH Dressing Goal: LTG Patient will perform lower body dressing w/assist (OT) Description LTG: Patient will perform lower body dressing with assist, with/without cues in positioning using equipment (OT) Flowsheets (Taken 08/15/2018 1438) LTG: Pt will perform lower body dressing with assistance level of: Contact Guard/Touching assist Note:  Goal downgraded 1/28-ESD   Problem: RH Toileting Goal: LTG Patient will perform toileting task (3/3 steps) with assistance level (OT) Description LTG: Patient will perform toileting task (3/3 steps) with assistance level (OT)  Flowsheets (Taken 08/15/2018 1438) LTG: Pt will perform toileting task (3/3 steps) with assistance level : Contact Guard/Touching assist Note:  Goal downgraded 1/28-ESD   Problem: RH Toilet Transfers Goal: LTG Patient will perform toilet transfers w/assist (OT) Description LTG: Patient will perform toilet transfers with assist, with/without cues using equipment (OT) Flowsheets (Taken 08/15/2018 1438) LTG: Pt will perform toilet transfers with assistance level of: Minimal Assistance - Patient > 75% Note:  Goal downgraded 1/28-ESD   Problem: RH Tub/Shower Transfers Goal: LTG Patient will perform tub/shower transfers w/assist (OT) Description LTG: Patient will perform tub/shower transfers with assist, with/without cues using equipment (OT) Flowsheets (Taken 08/15/2018 1438) LTG: Pt will perform tub/shower stall transfers with assistance level of: Minimal Assistance - Patient > 75% Note:  Goal downgraded 1/28-ESD

## 2018-08-15 NOTE — Progress Notes (Signed)
Physical Therapy Session Note  Patient Details  Name: [name] MRN: [id number] Date of Birth: 08-06-1950  Today's Date: 08/15/2018 PT Individual Time: 1030-1153 PT Individual Time Calculation (min): 83 min   Short Term Goals: Week 3:  PT Short Term Goal 1 (Week 3): = LTG  Skilled Therapeutic Interventions/Progress Updates:    pt performs sit <> stand and stand pivot transfers with RW with min A throughout session.  Gait with Rt foot up brace 50', 100', 50' x 2 with RW with min A, improved foot clearance with foot up brace, continues with toe drag with turning, orthotist to apply toe cap.  Supine LTR, bridge, SLR, hip abd/add all with focus on stretching and strengthening Rt LE.  Standing balance with tap ups to 4'' step with improving hip strength and stability noted.  nustep x 8 minutes level 4 for UE/LE strength and endurance. Pt states more fatigue this session due to pain meds but is able to work throughout session. Pt left in bed with all needs at hand, alarm set.  Therapy Documentation Precautions:  Precautions Precautions: Fall Precaution Comments: right hemiplegia, right lean Restrictions Weight Bearing Restrictions: No Pain:pt continues with c/o Lt shoulder pain, meds given prior to session   Therapy/Group: Individual Therapy  Geovannie Vilar 08/15/2018, 12:08 PM

## 2018-08-15 NOTE — Discharge Instructions (Signed)
Inpatient Rehab Discharge Instructions  [place] Discharge date and time:  08/18/18  Activities/Precautions/ Functional Status: Activity: no lifting, driving, or strenuous exercise till cleared by MD Diet: cardiac diet Wound Care: none needed   Functional status:  ___ No restrictions     ___ Walk up steps independently _X__ 24/7 supervision/assistance   ___ Walk up steps with assistance ___ Intermittent supervision/assistance  ___ Bathe/dress independently ___ Walk with walker     ___ Bathe/dress with assistance ___ Walk Independently    ___ Shower independently ___ Walk with assistance    ___ Shower with assistance _X__ No alcohol/tobacco    ___ Return to work/school ________    COMMUNITY REFERRALS UPON DISCHARGE:    Home Health:   PT     OT     ST                         Agency:  [facility] Phone: [phone]    Medical Equipment/Items Ordered:  Wheelchair, cushion, walker                                                      Agency/Supplier: [facility] @ [phone]    GENERAL COMMUNITY RESOURCES FOR PATIENT/FAMILY:  Support Groups:  Stroke Support Group                                2nd Thursday of every month (Sept - May)                              6-7 pm on the Rehab Unit at [facility] ([address])   Special Instructions: 1. Absolutely no alcohol, aleve, ibuprofen or motrin--this will cause bleeding in the stomach and anemia.    STROKE/TIA DISCHARGE INSTRUCTIONS SMOKING Cigarette smoking nearly doubles your risk of having a stroke & is the single most alterable risk factor  If you smoke or have smoked in the last 12 months, you are advised to quit smoking for your health.  Most of the excess cardiovascular risk related to smoking disappears within a year of stopping.  Ask you doctor about anti-smoking medications  [place] Quit Line: 1-800-QUIT NOW  Free Smoking Cessation Classes [phone]  CHOLESTEROL Know your levels;  limit fat & cholesterol in your diet  Lipid Panel     Component Value Date/Time   CHOL 102 07/22/2018 0408   TRIG 108 07/22/2018 0408   HDL 35 (L) 07/22/2018 0408   CHOLHDL 2.9 07/22/2018 0408   VLDL 22 07/22/2018 0408   LDLCALC 45 07/22/2018 0408      Many patients benefit from treatment even if their cholesterol is at goal.  Goal: Total Cholesterol (CHOL) less than 160  Goal:  Triglycerides (TRIG) less than 150  Goal:  HDL greater than 40  Goal:  LDL (LDLCALC) less than 100   BLOOD PRESSURE American Stroke Association blood pressure target is less that 120/80 mm/Hg  Your discharge blood pressure is:  BP: (!) 141/72  Monitor your blood pressure  Limit your salt and alcohol intake  Many individuals will require more than one medication for high blood pressure  DIABETES (A1c is a blood  sugar average for last 3 months) Goal HGBA1c is under 7% (HBGA1c is blood sugar average for last 3 months)  Diabetes: No known diagnosis of diabetes    Lab Results  Component Value Date   HGBA1C 4.7 (L) 07/22/2018     Your HGBA1c can be lowered with medications, healthy diet, and exercise.  Check your blood sugar as directed by your physician  Call your physician if you experience unexplained or low blood sugars.  PHYSICAL ACTIVITY/REHABILITATION Goal is 30 minutes at least 4 days per week  Activity: No driving, Therapies: see above Return to work: N/A  Activity decreases your risk of heart attack and stroke and makes your heart stronger.  It helps control your weight and blood pressure; helps you relax and can improve your mood.  Participate in a regular exercise program.  Talk with your doctor about the best form of exercise for you (dancing, walking, swimming, cycling).  DIET/WEIGHT Goal is to maintain a healthy weight  Your discharge diet is:  Diet Order            Diet regular Room service appropriate? Yes; Fluid consistency: Thin  Diet effective now             liquids Your height is:  Height: 5\' 9"  (175.3 cm) Your current weight is: Weight: 92.2 kg Your Body Mass Index (BMI) is:  BMI (Calculated): 30  Following the type of diet specifically designed for you will help prevent another stroke.  Your goal weight is:  167 lbs  Your goal Body Mass Index (BMI) is 19-24.  Healthy food habits can help reduce 3 risk factors for stroke:  High cholesterol, hypertension, and excess weight.  RESOURCES Stroke/Support Group:  Call [phone]   STROKE EDUCATION PROVIDED/REVIEWED AND GIVEN TO PATIENT Stroke warning signs and symptoms How to activate emergency medical system (call 911). Medications prescribed at discharge. Need for follow-up after discharge. Personal risk factors for stroke. Pneumonia vaccine given:  Flu vaccine given:  My questions have been answered, the writing is legible, and I understand these instructions.  I will adhere to these goals & educational materials that have been provided to me after my discharge from the hospital.       My questions have been answered and I understand these instructions. I will adhere to these goals and the provided educational materials after my discharge from the hospital.  Patient/Caregiver Signature _______________________________ Date __________  Clinician Signature _______________________________________ Date __________  Please bring this form and your medication list with you to all your follow-up doctor's appointments.

## 2018-08-15 NOTE — Progress Notes (Signed)
Speech Language Pathology Daily Session Note  Patient Details  Name: [name] MRN: [id number] Date of Birth: [birth year]  Today's Date: 08/15/2018 SLP Individual Time: 7356-7014 SLP Individual Time Calculation (min): 45 min  Short Term Goals: Week 3: SLP Short Term Goal 1 (Week 3): Pt will compensatory memory aids to recall information related to dialy schedule and activities with supervision verbal and visual cues.  SLP Short Term Goal 2 (Week 3): Pt will demonstrate selective attention in moderately distracting environment for ~ 60 minutes with supervision cues.  SLP Short Term Goal 3 (Week 3): Pt will complete semi-complex problem solving tasks with supervision verbal cues.  SLP Short Term Goal 4 (Week 3): Pt will self-monitor and self-correct errors during functional tasks with Mod I.  SLP Short Term Goal 5 (Week 3): Pt will utilize speech intelligibility strategies with Mod I to achieve speech intelligibility to > 90% at the sentence level.   Skilled Therapeutic Interventions: Skilled treatment session focused on cognitive goals. Upon arrival, patient asleep in bed but agreeable to participate in treatment session. Patient transferred to the wheelchair with overall Mod A. SLP facilitated session with a memory task in which the patient independently requested to write the information down to maximize recall. Patient independently utilized his memory compensatory strategy and was able to recalled/locate all 3 items with Mod I. Min A semantic cues needed to recall items patient had to recall yesterday in previous session. Patient also independently utilized his schedule to anticipate upcoming therapy appointments. Patient left upright in wheelchair with all needs within reach and alarm on. Continue with current plan of care.      Pain No/Denies Pain   Therapy/Group: Individual Therapy  Amana Bouska 08/15/2018, 3:39 PM

## 2018-08-16 ENCOUNTER — Inpatient Hospital Stay (HOSPITAL_COMMUNITY): Payer: Medicare Other | Admitting: Speech Pathology

## 2018-08-16 ENCOUNTER — Inpatient Hospital Stay (HOSPITAL_COMMUNITY): Payer: Medicare Other

## 2018-08-16 ENCOUNTER — Inpatient Hospital Stay (HOSPITAL_COMMUNITY): Payer: Medicare Other | Admitting: Physical Therapy

## 2018-08-16 ENCOUNTER — Inpatient Hospital Stay (HOSPITAL_COMMUNITY): Payer: Medicare Other | Admitting: Occupational Therapy

## 2018-08-16 MED ORDER — SALINE SPRAY 0.65 % NA SOLN
1.0000 | NASAL | Status: DC | PRN
  Administered 2018-08-16: 1 via NASAL
  Filled 2018-08-16: qty 44

## 2018-08-16 NOTE — Progress Notes (Addendum)
Speech Language Pathology Daily Session Note  Patient Details  Name: [name] MRN: [id number] Date of Birth: June 19, 1951  Today's Date: 08/16/2018 SLP Individual Time: 1430-1505 SLP Individual Time Calculation (min): 35 min  Short Term Goals: Week 3: SLP Short Term Goal 1 (Week 3): Pt will compensatory memory aids to recall information related to dialy schedule and activities with supervision verbal and visual cues.  SLP Short Term Goal 2 (Week 3): Pt will demonstrate selective attention in moderately distracting environment for ~ 60 minutes with supervision cues.  SLP Short Term Goal 3 (Week 3): Pt will complete semi-complex problem solving tasks with supervision verbal cues.  SLP Short Term Goal 4 (Week 3): Pt will self-monitor and self-correct errors during functional tasks with Mod I.  SLP Short Term Goal 5 (Week 3): Pt will utilize speech intelligibility strategies with Mod I to achieve speech intelligibility to > 90% at the sentence level.   Skilled Therapeutic Interventions: Skilled treatment session focused on cognitive goals. SLP facilitated session by administering the Cognistat.  Testing was unable to be completed this session and will continue during next scheduled appointment. However, patient scored WFL fo rall subtests administered today in the areas of attention, orientation, repetition, naming, memory and similarities. Patient's fiance present and educated in regards to patient's current cognitive functioning and strategies to utilize at home to maximize safety, memory and overall independence. She verbalized understanding and agreement. Patient left upright in wheelchair with alarm on and all needs within reach. Continue with current plan of care.      Pain No/Denies Pain   Therapy/Group: Individual Therapy  Chonda Baney 08/16/2018, 3:30 PM

## 2018-08-16 NOTE — Progress Notes (Signed)
Social Work Patient ID: [name], male   DOB: [birth year], 68 y.o.   MRN: [id number]   Have reviewed team conference with patient. Ready for d/c 08/18/18.  [name], LCSW

## 2018-08-16 NOTE — Progress Notes (Signed)
Patient c/o of nasal stuffiness; on call provider Pam Love notified and order for nasal spray ordered and initiated as ordered. Will continue to monitor

## 2018-08-16 NOTE — Patient Care Conference (Signed)
Inpatient RehabilitationTeam Conference and Plan of Care Update Date: 08/15/2018   Time: 2:30 PM    Patient Name: [name] [facility]      Medical Record Number: [id number]  Date of Birth: 07-06-1951 Sex: Male         Room/Bed: 4W11C/4W11C-01 Payor Info: Payor: [employer] MEDICARE / Plan: [facility] MEDICARE / Product Type: *No Product type* /    Admitting Diagnosis: L thalamus ich  Admit Date/Time:  07/26/2018  5:42 PM Admission Comments: No comment available   Primary Diagnosis:  Nontraumatic thalamic hemorrhage (HCC) Principal Problem: Nontraumatic thalamic hemorrhage [facility])  Patient Active Problem List   Diagnosis Date Noted  . Acute on chronic anemia   . Pain   . Agitation 07/26/2018  . Hepatitis C 07/26/2018  . Nontraumatic thalamic hemorrhage ([place]) 07/26/2018  . Osteoarthritis of left hip 04/06/2018  . [place] ulcer, chronic 01/11/2018  . Symptomatic anemia 01/10/2018  . Syncope 01/10/2018  . Hiatal hernia with gastroesophageal reflux disease and esophagitis 01/05/2018  . Insomnia 01/05/2018  . Acute pain of left shoulder 11/21/2017  . Anemia, iron deficiency   . Gastrointestinal bleeding 10/24/2015  . Tobacco abuse   . Acute ischemic stroke ([place])   . Stroke ([place]) 05/31/2015  . HTN (hypertension) 04/10/2014  . Inguinal hernia without mention of obstruction or gangrene, unilateral or unspecified, (not specified as recurrent)-right 01/30/2014    Expected Discharge Date: Expected Discharge Date: 08/18/18  Team Members Present: Physician leading conference: Dr. Alger Simons Social Worker Present: Lennart Pall, LCSW Nurse Present: Dwaine Gale, RN PT Present: Roderic Ovens, PT OT Present: Cherylynn Ridges, OT SLP Present: Weston Anna, SLP PPS Coordinator present : Ileana Ladd, PT     Current Status/Progress Goal Weekly Team Focus  Medical   improved pain to a degree. sleep better too, pt remains participatory in therapies  stabilize medically for discharge  bp  control, stroke education   Bowel/Bladder   Continent of B/B urinary incontinence at times LBM 01/27  continent of B/B normal bowel pattern  toilet q2h and prn laxatives prn   Swallow/Nutrition/ Hydration             ADL's   Min A/supervision  Supervision  R NMR, pt/family education, dc planning, modified bathing/dressing   Mobility   min A transfers and gait  downgraded to min A transfers and gait  family ed, d/c planning   Communication             Safety/Cognition/ Behavioral Observations  Supervision-Min A   Supervision   use of memory strategies, awareness   Pain   bilateral knee pain Left shoulder pain neck pain  ,=2/10   assess and treat pain qshift and prn   Skin   no skin issues   maintain skin integrity   assess skin qshift and prn    Rehab Goals Patient on target to meet rehab goals: Yes *See Care Plan and progress notes for long and short-term goals.     Barriers to Discharge  Current Status/Progress Possible Resolutions Date Resolved   Physician    Medical stability  pain     supervision at home      Nursing                  PT                    OT  SLP                SW                Discharge Planning/Teaching Needs:  Plan to d/c home with fiance who can provide 24/7 assistance.  Teaching still to be scheduled for next week.   Team Discussion:  Still c/o some shoulder pain and may need follow up outpatient.  Min - supervision with ADLs.  Added "foot up" brace which has been helpful.  Min assist tf and gait goal.  Pt aware need for family ed with fiance tomorrow.  Revisions to Treatment Plan:  NA    Continued Need for Acute Rehabilitation Level of Care: The patient requires daily medical management by a physician with specialized training in physical medicine and rehabilitation for the following conditions: Daily direction of a multidisciplinary physical rehabilitation program to ensure safe treatment while eliciting the highest  outcome that is of practical value to the patient.: Yes Daily medical management of patient stability for increased activity during participation in an intensive rehabilitation regime.: Yes Daily analysis of laboratory values and/or radiology reports with any subsequent need for medication adjustment of medical intervention for : Neurological problems;Blood pressure problems;Other   I attest that I was present, lead the team conference, and concur with the assessment and plan of the team.   Janayla Marik 08/16/2018, 10:58 AM

## 2018-08-16 NOTE — Progress Notes (Signed)
Occupational Therapy Session Note  Patient Details  Name: [name] MRN: [id number] Date of Birth: 1950-07-29  Today's Date: 08/16/2018 OT Individual Time: 4098-2867 OT Individual Time Calculation (min): 45 min    Short Term Goals: Week 1:  OT Short Term Goal 1 (Week 1): Pt will complete toilet transfer with mod A of 1 OT Short Term Goal 1 - Progress (Week 1): Met OT Short Term Goal 2 (Week 1): Pt will complete sit<>stand within BADL task with mod A OT Short Term Goal 2 - Progress (Week 1): Met OT Short Term Goal 3 (Week 1): Pt will use R UE for 50% of bathing tasks OT Short Term Goal 3 - Progress (Week 1): Met Week 2:  OT Short Term Goal 1 (Week 2): Pt will complete toilet transfer with consistent min A OT Short Term Goal 1 - Progress (Week 2): Met OT Short Term Goal 2 (Week 2): Pt will complete shower transfer with min A OT Short Term Goal 2 - Progress (Week 2): Met OT Short Term Goal 3 (Week 2): Pt will complete LB dressing with min A OT Short Term Goal 3 - Progress (Week 2): Met Week 3:  OT Short Term Goal 1 (Week 3): STG=LTG 2/2 ELOS  Skilled Therapeutic Interventions/Progress Updates:    Pt seen this session for ADL training and family education with pt's significant other.  See ADL documentation below.  She said his Bathroom is wc accessible with a walk in handicap shower stall with a bench.  Pt already had his R shoe with toe lifter on, so demonstrated to SO how he ambulated to bathroom with RW to sit on toilet to undress and then to shower with RW with cues to fully turn around with RW instead of just trying to reach for the grab bars.  She took an active role in A him with bathing and dressing and education focused on how she really needs to be there for CGA as she tended to overhelp him.  She is a trained caregiver and is accustomed to just helping her patients.  Pt then used a stand pivot out of shower to wc with CGA.  She did well but could benefit from more transfer  practice. She observed how to facilitate sit to stands when pt is getting tired.  Pt requesting to shave at end of session and his SO was supervising him.   Pt in wc with chair belt on and all needs met.   Therapy Documentation Precautions:  Precautions Precautions: Fall Precaution Comments: right hemiplegia, right lean Restrictions Weight Bearing Restrictions: No    Pain: Pain Assessment Pain Scale: 0-10 Pain Score: 0-No pain Pain Type: Chronic pain Pain Location: Shoulder Pain Orientation: Left Pain Descriptors / Indicators: Aching Pain Frequency: Constant Pain Onset: On-going Pain Intervention(s): Medication (See eMAR) ADL: ADL Eating: Set up Grooming: Independent Upper Body Bathing: Setup Lower Body Bathing: Contact guard Upper Body Dressing: Setup Lower Body Dressing: Contact guard Toilet Transfer: Maximal assistance Gaffer Transfer: Minimal assistance Social research officer, government Method: Stand pivot Youth worker: Shower seat with back   Therapy/Group: Individual Therapy  [place] 08/16/2018, 12:48 PM

## 2018-08-16 NOTE — Progress Notes (Signed)
Occupational Therapy Session Note  Patient Details  Name: [name] MRN: [id number] Date of Birth: 13-Dec-1950  Today's Date: 08/16/2018 OT Individual Time: 1345-1415 OT Individual Time Calculation (min): 30 min    Short Term Goals: Week 3:  OT Short Term Goal 1 (Week 3): STG=LTG 2/2 ELOS  Skilled Therapeutic Interventions/Progress Updates:    Pt received supine in bed, c/o pain as described below. Pt and his wife were educated on use and adjustment of new w/c and RW that they will bring home. RW adjusted to pt height. Pt completed SPT with min A throughout session. Pt completed 20 ft of functional mobility x2 with min A using RW. Pt completed B UE coordination activity seated unsupported with cueing provided for L UE activation and positioning. Pt returned to room, left supine all needs met.   Therapy Documentation Precautions:  Precautions Precautions: Fall Precaution Comments: right hemiplegia, right lean Restrictions Weight Bearing Restrictions: No   Vital Signs: Therapy Vitals Temp: 97.6 F (36.4 C) Temp Source: Oral Pulse Rate: 64 Resp: 18 BP: 139/60 Patient Position (if appropriate): Lying Oxygen Therapy SpO2: 100 % O2 Device: Room Air Pain: Pain Assessment Pain Scale: 0-10 Pain Score: 6  Pain Type: Chronic pain Pain Location: Shoulder Pain Orientation: Left Pain Descriptors / Indicators: Aching;Dull;Discomfort Pain Frequency: Constant Pain Onset: On-going Patients Stated Pain Goal: 2 Pain Intervention(s): Medication (See eMAR)   Therapy/Group: Individual Therapy  Curtis Sites 08/16/2018, 7:17 AM

## 2018-08-16 NOTE — Progress Notes (Signed)
Speech Language Pathology Daily Session Note  Patient Details  Name: [name] MRN: [id number] Date of Birth: 1951/02/26  Today's Date: 08/16/2018 SLP Individual Time: 0903-1000 SLP Individual Time Calculation (min): 57 min  Short Term Goals: Week 3: SLP Short Term Goal 1 (Week 3): Pt will compensatory memory aids to recall information related to dialy schedule and activities with supervision verbal and visual cues.  SLP Short Term Goal 2 (Week 3): Pt will demonstrate selective attention in moderately distracting environment for ~ 60 minutes with supervision cues.  SLP Short Term Goal 3 (Week 3): Pt will complete semi-complex problem solving tasks with supervision verbal cues.  SLP Short Term Goal 4 (Week 3): Pt will self-monitor and self-correct errors during functional tasks with Mod I.  SLP Short Term Goal 5 (Week 3): Pt will utilize speech intelligibility strategies with Mod I to achieve speech intelligibility to > 90% at the sentence level.   Skilled Therapeutic Interventions: Skilled ST services focused on cognitive skills. Pt was asleep upon entering room, however agreeable to participate in treatment session. Pt had not consumed breakfast piror to session and requested  tray to be reheated, SLP reheated and set up tray. Pt requested urinal x2 in session and required min A physical support. SLP facilitated recall of memory strategies, educated in pervious session, pt verbalized strategies mod I. SLP facilitated recall of 4 novel words, with initial written aid, pt demonstrated delayed recall of 4/4 words following 5 minutes mod I, 3/4 words following 10 minutes and progressing to 4/4 words in 10 minute intervals in a distracting environment, while participating in conversation and consuming breakfast tray. SLP facilitated recall of novel information, ST read short paragraphs, pt demonstrated delayed recall in 1 minute intervals requiring min A verbal cues in a mildly distracting  environment, continuing to consume breakfast tray. Pt was left in bed with call bell within reach and bed alarm set. Recommend to continue skilled ST services.     Pain Pain Assessment Pain Scale: 0-10 Pain Score: 0-No pain Pain Type: Chronic pain Pain Location: Shoulder Pain Orientation: Left Pain Descriptors / Indicators: Aching Pain Frequency: Constant Pain Onset: On-going Pain Intervention(s): Medication (See eMAR)  Therapy/Group: Individual Therapy  Tanvi Gatling  [facility] 08/16/2018, 4:15 PM

## 2018-08-16 NOTE — Progress Notes (Signed)
[name] PHYSICAL MEDICINE & REHABILITATION PROGRESS NOTE   Subjective/Complaints: Pt sleeping soundly when I arrived. Received tramadol earlier in morning. Had doseyesterday   ROS: Limited due to cognitive/behavioral    Objective:   No results found. Recent Labs    08/14/18 0551  WBC 5.7  HGB 8.5*  HCT 29.3*  PLT 417*   Recent Labs    08/14/18 0551  NA 138  K 3.9  CL 109  CO2 19*  GLUCOSE 100*  BUN 18  CREATININE 1.04  CALCIUM 8.8*    Intake/Output Summary (Last 24 hours) at 08/16/2018 1036 Last data filed at 08/15/2018 2300 Gross per 24 hour  Intake 960 ml  Output 500 ml  Net 460 ml     Physical Exam: Vital Signs Blood pressure 139/60, pulse 64, temperature 97.6 F (36.4 C), temperature source Oral, resp. rate 18, height 5\' 9"  (1.753 m), weight 92.3 kg, SpO2 100 %.  Constitutional: No distress . Vital signs reviewed. HEENT: EOMI, oral membranes moist Neck: supple Cardiovascular: RRR without murmur. No JVD    Respiratory: CTA Bilaterally without wheezes or rales. Normal effort    GI: BS +, non-tender, non-distended  Musc:Left shoulder tender Neurological:  dysarthric Follows basic commands. Fair insight.   Motor:  LUE: 2+/5 prox to 4-/5 distally--no change  LLE: HF, KE 3+/5, ADF 4/5-no change Skin: Skin is warm and dry.  Multiple healed lesions on shins--stable Psychiatric: slow to arouse this am   Assessment/Plan: 1. Functional deficits secondary to left thalamic hemorrhage which require 3+ hours per day of interdisciplinary therapy in a comprehensive inpatient rehab setting.  Physiatrist is providing close team supervision and 24 hour management of active medical problems listed below.  Physiatrist and rehab team continue to assess barriers to discharge/monitor patient progress toward functional and medical goals  Care Tool:  Bathing  Bathing activity did not occur: Refused Body parts bathed by patient: Right arm, Left arm, Chest, Abdomen,  Front perineal area, Buttocks, Left upper leg, Right upper leg, Right lower leg, Left lower leg, Face   Body parts bathed by helper: Buttocks     Bathing assist Assist Level: Contact Guard/Touching assist     Upper Body Dressing/Undressing Upper body dressing Upper body dressing/undressing activity did not occur (including orthotics): Refused What is the patient wearing?: Pull over shirt    Upper body assist Assist Level: Set up assist    Lower Body Dressing/Undressing Lower body dressing    Lower body dressing activity did not occur: Refused What is the patient wearing?: Underwear/pull up, Pants     Lower body assist Assist for lower body dressing: Contact Guard/Touching assist     Toileting Toileting Toileting Activity did not occur (Clothing management and hygiene only): N/A (no void or bm)  Toileting assist Assist for toileting: Contact Guard/Touching assist     Transfers Chair/bed transfer  Transfers assist     Chair/bed transfer assist level: Minimal Assistance - Patient > 75%     Locomotion Ambulation   Ambulation assist      Assist level: Minimal Assistance - Patient > 75% Assistive device: Walker-rolling Max distance: 134ft   Walk 10 feet activity   Assist     Assist level: Minimal Assistance - Patient > 75% Assistive device: Walker-rolling   Walk 50 feet activity   Assist Walk 50 feet with 2 turns activity did not occur: Safety/medical concerns  Assist level: Minimal Assistance - Patient > 75% Assistive device: Walker-rolling    Walk 150 feet activity  Assist Walk 150 feet activity did not occur: Safety/medical concerns  Assist level: Minimal Assistance - Patient > 75% Assistive device: Walker-rolling    Walk 10 feet on uneven surface  activity   Assist Walk 10 feet on uneven surfaces activity did not occur: Safety/medical concerns         Wheelchair     Assist Will patient use wheelchair at discharge?: No Type  of Wheelchair: Manual    Wheelchair assist level: Supervision/Verbal cueing Max wheelchair distance: 125ft    Wheelchair 50 feet with 2 turns activity    Assist    Wheelchair 50 feet with 2 turns activity did not occur: Safety/medical concerns   Assist Level: Supervision/Verbal cueing   Wheelchair 150 feet activity     Assist Wheelchair 150 feet activity did not occur: Refused         Medical Problem List and Plan: 1.  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  -Continue CIR therapies including PT, OT, and SLP           2.  DVT Prophylaxis/Anticoagulation: Pharmaceutical: Lovenox 3.  History of chronic back pain/headaches/pain Management: Tylenol as needed for now  -Patient status post left shoulder injection/subacromial. Some improvement in  Pain but ROM still limited.   -added tramadol as pain is limiting with activity still---observe for tolerance of medication. Appears more groggy this morning 4. Mood: LCSW to follow for evaluation and support  -continue low dose xanax prn 5. Neuropsych: This patient is not fully capable of making decisions on his own behalf. 6. Skin/Wound Care: Routine pressure relief measures 7. Fluids/Electrolytes/Nutrition: encourage PO        8.  HTN: Monitor blood pressures. Continue amlodipine and lisinopril-- continue to titrate medications to control.   Hydralazine 10 nightly started on 1/12 with improvement 9.  Acute on chronic blood loss anemia/HH with gastritis:   hgb holding at 8.5 1/27  Hemoccult positive   -continue iron supplement  -iron studies all c/w iron deficiency anemia  GI consulted, infusion ordered x 2  -no other intervention recommended at present    10. Chronic Insomnia/sleep state misperception:    Ttrazodone 100 mg nightly was ineffective--resumed home regimen of trazodone 50mg   and Ambien 5 mg--->pharmacy is limiting him to only 5 mg due to his age.   However patient uses 10 mg at home.  -continue 50mg  seroquel with 5mg  [name] been most effective 11. Tobacco dependence:  -not a nicotine patch candidate  -Continue trial of wellbutrin 75mg , increased to bid 1/24     LOS: 21 days A FACE TO [place] 08/16/2018, 10:36 AM

## 2018-08-16 NOTE — Progress Notes (Signed)
Physical Therapy Session Note  Patient Details  Name: [name] MRN: [id number] Date of Birth: [birth year]  Today's Date: 08/16/2018 PT Individual Time: 1000-1030 PT Individual Time Calculation (min): 30 min   Short Term Goals: Week 1:  PT Short Term Goal 1 (Week 1): Patient to demonstrate transfers consistently with mod A PT Short Term Goal 1 - Progress (Week 1): Met PT Short Term Goal 2 (Week 1): Patient to perform w/c management with S x 150'. PT Short Term Goal 2 - Progress (Week 1): Met PT Short Term Goal 3 (Week 1): Patient to ambulate with mod A x 50' PT Short Term Goal 3 - Progress (Week 1): Met PT Short Term Goal 4 (Week 1): Patient to demonstrate sitting balance during functional task with S PT Short Term Goal 4 - Progress (Week 1): Met Week 2:  PT Short Term Goal 1 (Week 2): pt will consistently perform functional transfers with min A PT Short Term Goal 1 - Progress (Week 2): Progressing toward goal PT Short Term Goal 2 (Week 2): pt will perform gait x 50' in controlled environment with min A PT Short Term Goal 2 - Progress (Week 2): Met Week 3:  PT Short Term Goal 1 (Week 3): = LTG  Skilled Therapeutic Interventions/Progress Updates:   Pt received lying in bed and agreeable to tx. Pt transfers supine>sit with supervision and dons pants with assistance from therapist to thread pant legs and maintain balance while pulling them up. Pt transfers bed>w/c with RW and min assist. Pt dons shoes with set-up assistance from therapist but able to push foot into shoe and tie shoelaces with supervision and increased breaks due to shortness of breath when bending over. Pt states he is unable to pull foot up to knee to avoid bending over to tie shoe. Pt dons R foot-up brace with assistance from therapist to buckle to shoe and therapist provided education to pt for donning brace on his own. Pt propels w/c for 75 ft + 75 ft with BUE and supervision. Pt sits<>stands with RW and min assist  with verbal cuing for hand placement on walker. Pt gait training with RW and R foot-up brace 50 ft with 2 turns to the left + 50 ft with 2 turns to the right with min assist and tactile cuing on R hip during R stance phase to maintain pelvic alignment and verbal cuing for R foot drop when pt became fatigued. Pt states that he notices the R foot-up brace helps when walking. Pt left sitting up in w/c with chair alarm donned and all needs in reach.   No c/o pain during session but c/o SOB when bending over to don shoes, after w/c mobility, and after ambulating and rest breaks provided.   Therapy Documentation Precautions:  Precautions Precautions: Fall Precaution Comments: right hemiplegia, right lean Restrictions Weight Bearing Restrictions: No   Therapy/Group: Individual Therapy  [name] 08/16/2018, 12:54 PM

## 2018-08-17 ENCOUNTER — Inpatient Hospital Stay (HOSPITAL_COMMUNITY): Payer: Medicare Other | Admitting: Physical Therapy

## 2018-08-17 ENCOUNTER — Inpatient Hospital Stay (HOSPITAL_COMMUNITY): Payer: Medicare Other | Admitting: Occupational Therapy

## 2018-08-17 ENCOUNTER — Inpatient Hospital Stay (HOSPITAL_COMMUNITY): Payer: Medicare Other | Admitting: Speech Pathology

## 2018-08-17 NOTE — Progress Notes (Signed)
[place] PHYSICAL MEDICINE & REHABILITATION PROGRESS NOTE   Subjective/Complaints: Pt up in bed. Asking why we "won't get him a powered wheelchair"  ROS: Patient denies fever, rash, sore throat, blurred vision, nausea, vomiting, diarrhea, cough, shortness of breath or chest pain,  headache, or mood change.    Objective:   No results found. No results for input(s): WBC, HGB, HCT, PLT in the last 72 hours. No results for input(s): NA, K, CL, CO2, GLUCOSE, BUN, CREATININE, CALCIUM in the last 72 hours.  Intake/Output Summary (Last 24 hours) at 08/17/2018 0933 Last data filed at 08/17/2018 0548 Gross per 24 hour  Intake 960 ml  Output 950 ml  Net 10 ml     Physical Exam: Vital Signs Blood pressure (!) 152/71, pulse (!) 58, temperature 98 F (36.7 C), temperature source Oral, resp. rate 18, height 5\' 9"  (1.753 m), weight 92.3 kg, SpO2 99 %.  Constitutional: No distress . Vital signs reviewed. HEENT: EOMI, oral membranes moist Neck: supple Cardiovascular: RRR without murmur. No JVD    Respiratory: CTA Bilaterally without wheezes or rales. Normal effort    GI: BS +, non-tender, non-distended  Musc:Left shoulder tender Neurological:  dysarthric Follows basic commands. Fair insight. STM deficits Motor:  LUE: 2+/5 prox to 4-/5 distally--no change  LLE: HF, KE 3+/5, ADF 4/5-no change Skin: Skin is warm and dry.  Multiple healed lesions on shins-  Psychiatric: irritable   Assessment/Plan: 1. Functional deficits secondary to left thalamic hemorrhage which require 3+ hours per day of interdisciplinary therapy in a comprehensive inpatient rehab setting.  Physiatrist is providing close team supervision and 24 hour management of active medical problems listed below.  Physiatrist and rehab team continue to assess barriers to discharge/monitor patient progress toward functional and medical goals  Care Tool:  Bathing  Bathing activity did not occur: Refused Body parts bathed by  patient: Right arm, Left arm, Chest, Abdomen, Buttocks, Front perineal area, Left upper leg, Right upper leg, Right lower leg, Left lower leg, Face   Body parts bathed by helper: Buttocks     Bathing assist Assist Level: Set up assist     Upper Body Dressing/Undressing Upper body dressing Upper body dressing/undressing activity did not occur (including orthotics): Refused What is the patient wearing?: Pull over shirt    Upper body assist Assist Level: Set up assist    Lower Body Dressing/Undressing Lower body dressing    Lower body dressing activity did not occur: Refused What is the patient wearing?: Underwear/pull up, Pants     Lower body assist Assist for lower body dressing: Supervision/Verbal cueing     Toileting Toileting Toileting Activity did not occur (Clothing management and hygiene only): N/A (no void or bm)  Toileting assist Assist for toileting: Contact Guard/Touching assist     Transfers Chair/bed transfer  Transfers assist     Chair/bed transfer assist level: Contact Guard/Touching assist     Locomotion Ambulation   Ambulation assist      Assist level: Minimal Assistance - Patient > 75% Assistive device: Walker-rolling(R foot-up brace) Max distance: 50 ft   Walk 10 feet activity   Assist     Assist level: Minimal Assistance - Patient > 75% Assistive device: Walker-rolling(R foot-up brace)   Walk 50 feet activity   Assist Walk 50 feet with 2 turns activity did not occur: Safety/medical concerns  Assist level: Minimal Assistance - Patient > 75%(R foot-up brace) Assistive device: Walker-rolling    Walk 150 feet activity   Assist Walk  150 feet activity did not occur: Safety/medical concerns  Assist level: Minimal Assistance - Patient > 75% Assistive device: Walker-rolling    Walk 10 feet on uneven surface  activity   Assist Walk 10 feet on uneven surfaces activity did not occur: Safety/medical concerns          Wheelchair     Assist Will patient use wheelchair at discharge?: No Type of Wheelchair: Manual    Wheelchair assist level: Supervision/Verbal cueing Max wheelchair distance: 75 ft    Wheelchair 50 feet with 2 turns activity    Assist    Wheelchair 50 feet with 2 turns activity did not occur: Safety/medical concerns   Assist Level: Supervision/Verbal cueing   Wheelchair 150 feet activity     Assist Wheelchair 150 feet activity did not occur: Refused         Medical Problem List and Plan: 1.  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  -Continue CIR therapies including PT, OT, and SLP    -discussed reasons that we are not pursuing powered w/c including ambulation goals, his use of manual chair.  2.  DVT Prophylaxis/Anticoagulation: Pharmaceutical: Lovenox 3.  History of chronic back pain/headaches/pain Management: Tylenol as needed for now  -Patient status post left shoulder injection/subacromial. Some improvement in  Pain but ROM still limited.   -cont tramadol as pain is limiting with activity  4. Mood: LCSW to follow for evaluation and support  -continue low dose xanax prn 5. Neuropsych: This patient is not fully capable of making decisions on his own behalf. 6. Skin/Wound Care: Routine pressure relief measures 7. Fluids/Electrolytes/Nutrition: encourage PO        8.  HTN: Monitor blood pressures. Continue amlodipine and lisinopril-- continue to titrate medications to control.   Hydralazine 10 nightly started on 1/12 with improvement 9.  Acute on chronic blood loss anemia/HH with gastritis:   hgb holding at 8.5 1/27  Hemoccult positive   -continue iron supplement  -iron studies all c/w iron deficiency anemia  GI consulted, infusion ordered x 2  -no other intervention recommended at present    10. Chronic Insomnia/sleep state misperception:    Ttrazodone 100 mg nightly was ineffective--resumed  home regimen of trazodone 50mg   and Ambien 5 mg--->pharmacy is limiting him to only 5 mg due to his age.  However patient uses 10 mg at home.  -continue 50mg  seroquel with 5mg  ambien-has been most effective 11. Tobacco dependence:  -not a nicotine patch candidate  -Continue trial of wellbutrin 75mg , increased to bid 1/24     LOS: 22 days A FACE TO [place] 08/17/2018, 9:33 AM

## 2018-08-17 NOTE — Progress Notes (Signed)
Physical Therapy Discharge Summary  Patient Details  Name: [name] MRN: [id number] Date of Birth: [birth year]  Today's Date: 08/17/2018 PT Individual Time: 0263-7858 PT Individual Time Calculation (min): 57 min   Pt able to perform transfers with min A and RW throughout session.  Gait training 50' x 3 with RW and min A with use of foot up brace.  Gait with obstacle negotiation with focus on tight turns with min A with RW.  Stair negotiation with 2 handrails with min A x 4 stairs.  PT emphasizes recommendation that pt use ramp at home.  Pt performs simulated car transfer to sedan height car with min A.  nustep for UE/LE strength and endurance x 8 minutes level 5.  W/c mobility throughout unit with supervision with bilat UEs.  Pt left in chair with alarm set, RN present, needs at hand.  Patient has met 9 of 10 long term goals due to improved activity tolerance, improved balance, increased strength, decreased pain, ability to compensate for deficits, functional use of  right upper extremity and right lower extremity, improved attention and improved awareness.  Patient to discharge at an ambulatory level [place], supervision from w/c level.   Patient's care partner unavailable for family education.  Pt educated to not perform gait at home until HHPT is present. Pt verbalizes understanding that he is to be w/c level at home until HHPT present.  Reasons goals not met: stair goal n/a as pt has ramp to enter home  Recommendation:  Patient will benefit from ongoing skilled PT services in home health setting to continue to advance safe functional mobility, address ongoing impairments in gait, strength, balance, and minimize fall risk.  Equipment: RW, w/c  Reasons for discharge: treatment goals met and discharge from hospital  Patient/family agrees with progress made and goals achieved: Yes  PT Discharge Precautions/Restrictions Precautions Precautions: Fall Precaution Comments: Rt  hemiplegia Restrictions Weight Bearing Restrictions: No Pain Pain Assessment Pain Scale: 0-10 Pain Score: 3  Pain Type: Chronic pain Pain Location: Shoulder Pain Orientation: Left Pain Descriptors / Indicators: Aching Pain Onset: On-going Pain Intervention(s): Repositioned;Rest  Cognition Overall Cognitive Status: History of cognitive impairments - at baseline Arousal/Alertness: Awake/alert Orientation Level: Oriented X4 Selective Attention: Appears intact Awareness: Impaired Awareness Impairment: Anticipatory impairment Safety/Judgment: Impaired Comments: decreased anticipatory awareness Sensation Sensation Light Touch: Appears Intact Proprioception: Impaired Detail Proprioception Impaired Details: Impaired RLE Coordination Gross Motor Movements are Fluid and Coordinated: No Fine Motor Movements are Fluid and Coordinated: No Coordination and Movement Description: decreased smoothness and accuracy, but improved since eval Motor  Motor Motor: Hemiplegia Motor - Discharge Observations: Hemiplegia  Mobility Bed Mobility Supine to Sit: Supervision/Verbal cueing Transfers Sit to Stand: Minimal Assistance - Patient > 75% Stand to Sit: Minimal Assistance - Patient > 75% Stand Pivot Transfers: Minimal Assistance - Patient > 75% Locomotion  Gait Gait Assistance: Minimal Assistance - Patient > 75% Gait Distance (Feet): 50 Feet Assistive device: Rolling walker Stairs / Additional Locomotion Stairs Assistance: Minimal Assistance - Patient > 75% Stair Management Technique: Two rails Number of Stairs: 4 Ramp: Minimal Assistance - Patient >75% Product manager Assistance: Chartered loss adjuster: Both upper extremities Wheelchair Parts Management: Needs assistance Distance: 150  Trunk/Postural Assessment  Cervical Assessment Cervical Assessment: (fwd head) Thoracic Assessment Thoracic Assessment: (rounded shoulders) Lumbar  Assessment Lumbar Assessment: (posterior pelvic tilt) Postural Control Righting Reactions: delayed  Balance Static Standing Balance Static Standing - Level of Assistance: 5: Stand by assistance Dynamic Standing Balance Dynamic  Standing - Level of Assistance: 4: Min assist Extremity Assessment      RLE Assessment General Strength Comments: hip flexion 3/5, knee extension 4-/5, ankle DF 4+/5 LLE Assessment LLE Assessment: Within Functional Limits    [name] 08/17/2018, 1:47 PM

## 2018-08-17 NOTE — Progress Notes (Signed)
Occupational Therapy Session Note  Patient Details  Name: [name] MRN: [id number] Date of Birth: 05-Jun-1951  Today's Date: 08/17/2018 OT Individual Time: 0459-9774 OT Individual Time Calculation (min): 60 min    Short Term Goals: Week 3:  OT Short Term Goal 1 (Week 3): STG=LTG 2/2 ELOS  Skilled Therapeutic Interventions/Progress Updates:    Pt greeted semi-reclined in bed and agreeable to OT treatment session. Pt completed stand-pivot and squat-pivot transfers throughout session with CGA and some supervision. Bathing completed seated on shower bench with only set-up A and pt able to use leaning method to wash buttocks. Dressing completed with overall set-up/supervision assist and increased time. Grooming tasks completed wc level at the sink without assistance. Pt states his significant other should be here this afternoon for more family ed. Pt left seated in wc at end of session with safety belt on and needs met.   Therapy Documentation Precautions:  Precautions Precautions: Fall Precaution Comments: R henmiplegia  Restrictions Weight Bearing Restrictions: No Pain: Pain Assessment Pain Scale: 0-10 Pain Score: 4  Pain Type: Chronic pain Pain Location: Shoulder Pain Orientation: Left Pain Descriptors / Indicators: Aching Pain Onset: On-going Pain Intervention(s): Repositioned   Therapy/Group: Individual Therapy  Valma Cava 08/17/2018, 9:57 AM

## 2018-08-17 NOTE — Progress Notes (Signed)
Speech Language Pathology Discharge Summary  Patient Details  Name: [name] MRN: [id number] Date of Birth: [birth year]  Today's Date: 08/17/2018 SLP Individual Time: 1100-3496 SLP Individual Time Calculation (min): 30 min and Today's Date: 08/17/2018 SLP Missed Time: 15 Minutes Missed Time Reason: Patient fatigue   Skilled Therapeutic Interventions:   Skilled treatment session focused on cognitive goals. Upon arrival, patient asleep while supine in bed and agreeable to sit EOB to participate in treatment session. Patient lethargic throughout session and consistently "resting" his head on the bedside table. Patient completed the Cognistat and scored WFL for remaining subtests in the areas of calculations, judgement and comprehension.  SLP also reinforced education in regards to need for 24 hour supervision and strategies to utilize to maximize safety. Patient verbalized understanding and all questions were answered. Patient missed remaining 15 mins of session due to fatigue. Patient left supine in bed with alarm on and all needs within reach.   Patient has met 6 of 6 long term goals.  Patient to discharge at overall Supervision level.   Reasons goals not met: N/A    Clinical Impression/Discharge Summary: Patient has made functional gains and has met 6 of 6 LTGs this admission. Currently, patient requires overall supervision level verbal cues to complete functional and semi-complex tasks safely in regards recall with use of compensatory strategies, functional problem solving, emergent awareness and attention. Patient also demonstrates improved speech intelligibility and requires overall supervision level verbal cues to achieve ~100% intelligibility at the conversation level. Patient and caregiver education is complete and patient will discharge home with 24 hour supervision. Patient would benefit from f/u SLP services to maximize his speech intelligibility, cognitive functioning and overall  functional independence in order to reduce caregiver burden.   Care Partner:  Caregiver Able to Provide Assistance: Yes  Type of Caregiver Assistance: Physical;Cognitive  Recommendation:  24 hour supervision/assistance;Home Health SLP  Rationale for SLP Follow Up: Reduce caregiver burden;Maximize cognitive function and independence, Maximize functional communication    Equipment: N/A    Reasons for discharge: Discharged from hospital;Treatment goals met   Patient/Family Agrees with Progress Made and Goals Achieved: Yes    Liala Codispoti 08/17/2018, 6:51 AM

## 2018-08-17 NOTE — Progress Notes (Addendum)
Occupational Therapy Discharge Summary  Patient Details  Name: [name] MRN: [id number] Date of Birth: 15-Apr-1951  Today's Date: 08/17/2018 OT Individual Time: 1455-1519 OT Individual Time Calculation (min): 24 min   Pt greeted seated in wc and agreeable to OT treatment. Pt's significant other not present for family education. B UE strength/coordination with wc propulsion to therapy gym. Provided pt with walker bag and educated on functional uses. Placed bean bags on wall and completed scavenger hunt ambulating with RW. Pt needed min A for balance with starting and stopping, + verbal cues for R foot positioning prior to reaching for bean bags. Educated pt on waiting for home health to train significant other on ambulation prior to walking at home, pt agreeable.  Pt returned to room at end of session and left seated in wc with safety belt on and needs met.   Patient has met 12 of 12 long term goals due to improved activity tolerance, improved balance, postural control, ability to compensate for deficits, functional use of  RIGHT upper and RIGHT lower extremity, improved attention, improved awareness and improved coordination.  Patient to discharge at overall supervision/ lMin Assist level.  Patient's care partner is independent to provide the necessary physical assistance at discharge.   Reasons goals not met: n/a  Recommendation:  Patient will benefit from ongoing skilled OT services in home health setting to continue to advance functional skills in the area of BADL.  Equipment: w/c, RW  Reasons for discharge: treatment goals met and discharge from hospital  Patient/family agrees with progress made and goals achieved: Yes  OT Discharge Precautions/Restrictions  Precautions Precautions: Fall Precaution Comments: R henmiplegia  Restrictions Weight Bearing Restrictions: No Pain Pain Assessment Pain Scale: 0-10 Pain Score: 4  Pain Type: Chronic pain Pain Location:  Shoulder Pain Orientation: Left Pain Descriptors / Indicators: Aching Pain Onset: On-going Pain Intervention(s): Repositioned ADL ADL Eating: Independent Grooming: Independent Upper Body Bathing: Supervision/safety Lower Body Bathing: Supervision/safety Upper Body Dressing: Setup Lower Body Dressing: Supervision/safety Toileting: Contact guard Toilet Transfer: Curator: Curator Method: Public house manager with back Perception  Perception: Impaired Inattention/Neglect: Does not attend to right Lexicographer Overall Cognitive Status: History of cognitive impairments - at baseline Arousal/Alertness: Awake/alert Orientation Level: Oriented X4 Attention: Selective Sustained Attention: Appears intact Selective Attention: Appears intact Memory: Impaired Memory Impairment: Decreased short term memory Awareness: Impaired Awareness Impairment: Anticipatory impairment Problem Solving: Impaired Problem Solving Impairment: Functional complex Safety/Judgment: Appears intact Sensation Sensation Light Touch: Appears Intact Hot/Cold: Appears Intact Coordination Gross Motor Movements are Fluid and Coordinated: No Fine Motor Movements are Fluid and Coordinated: No Coordination and Movement Description: decreased smoothness and accuracy, but improved since eval Finger Nose Finger Test: Dysmetria Motor  Motor Motor: Hemiplegia Motor - Discharge Observations: Hemiplegia Mobility  Bed Mobility Bed Mobility: Supine to Sit Supine to Sit: Supervision/Verbal cueing Transfers Sit to Stand: Supervision/Verbal cueing Stand to Sit: Supervision/Verbal cueing  Balance Balance Balance Assessed: Yes Static Sitting Balance Static Sitting - Level of Assistance: 7: Independent Dynamic Sitting Balance Dynamic Sitting - Balance Support: During functional activity Dynamic Sitting - Level of  Assistance: 7: Independent Static Standing Balance Static Standing - Balance Support: During functional activity Static Standing - Level of Assistance: 5: Stand by assistance Dynamic Standing Balance Dynamic Standing - Balance Support: During functional activity Dynamic Standing - Level of Assistance: 5: Stand by assistance(CGA) Extremity/Trunk Assessment RUE Assessment RUE Assessment: Exceptions to [facility] Passive  Range of Motion (PROM) Comments: WFL Active Range of Motion (AROM) Comments: WFL General Strength Comments: 4-/5 RUE Body System: Neuro Brunstrum levels for arm and hand: Arm;Hand Brunstrum level for arm: Stage V Relative Independence from Synergy Brunstrum level for hand: Stage VI Isolated joint movements RUE AROM (degrees) Overall AROM Right Upper Extremity: Within functional limits for tasks performed RUE Strength RUE Overall Strength Comments: 4-/5 overall- deficits more in coordination  LUE Assessment LUE Assessment: Exceptions to [facility] Active Range of Motion (AROM) Comments: Limited shoulder elevation due to pain/weakness in shoulder- shoulder flexion only about 60 degrees General Strength Comments: Shoulder elevation 2+/5, elbow flexion 4/5, extension 4/5   Daneen Schick Jacque Garrels 08/17/2018, 9:57 AM

## 2018-08-18 MED ORDER — SENNOSIDES-DOCUSATE SODIUM 8.6-50 MG PO TABS: 1.0000 | ORAL_TABLET | Freq: Two times a day (BID) | ORAL | 0 refills | Status: DC

## 2018-08-18 MED ORDER — QUETIAPINE FUMARATE 50 MG PO TABS: 50.0000 mg | ORAL_TABLET | Freq: Every day | ORAL | 0 refills | Status: DC

## 2018-08-18 MED ORDER — ZOLPIDEM TARTRATE 5 MG PO TABS: 5.0000 mg | ORAL_TABLET | Freq: Every day | ORAL | 0 refills | Status: DC

## 2018-08-18 MED ORDER — HYDRALAZINE HCL 10 MG PO TABS: 10.0000 mg | ORAL_TABLET | Freq: Every day | ORAL | 0 refills | Status: DC

## 2018-08-18 MED ORDER — BUPROPION HCL 75 MG PO TABS: 75.0000 mg | ORAL_TABLET | Freq: Two times a day (BID) | ORAL | 0 refills | Status: DC

## 2018-08-18 MED ORDER — LIDOCAINE 5 % EX PTCH: 1.0000 | MEDICATED_PATCH | CUTANEOUS | 0 refills | Status: DC

## 2018-08-18 MED ORDER — CYCLOBENZAPRINE HCL 5 MG PO TABS: 5.0000 mg | ORAL_TABLET | Freq: Two times a day (BID) | ORAL | 0 refills | Status: DC | PRN

## 2018-08-18 MED ORDER — AMLODIPINE BESYLATE 10 MG PO TABS: 10.0000 mg | ORAL_TABLET | Freq: Every day | ORAL | 0 refills | Status: DC

## 2018-08-18 MED ORDER — ALPRAZOLAM 0.25 MG PO TABS: 0.2500 mg | ORAL_TABLET | Freq: Every day | ORAL | 0 refills | Status: DC | PRN

## 2018-08-18 MED ORDER — ACETAMINOPHEN 325 MG PO TABS: 650.0000 mg | ORAL_TABLET | Freq: Three times a day (TID) | ORAL | Status: DC

## 2018-08-18 MED ORDER — SALINE SPRAY 0.65 % NA SOLN: 1.0000 | NASAL | 0 refills | Status: DC | PRN

## 2018-08-18 MED ORDER — LISINOPRIL 40 MG PO TABS: 40.0000 mg | ORAL_TABLET | Freq: Every day | ORAL | 0 refills | Status: DC

## 2018-08-18 MED ORDER — TRAMADOL HCL 50 MG PO TABS: 50.0000 mg | ORAL_TABLET | Freq: Two times a day (BID) | ORAL | 0 refills | Status: DC | PRN

## 2018-08-18 NOTE — Progress Notes (Signed)
Pt discharged home with family. Belongings sent with pt. Discharge instructions given. No further questions from pt or family. Pain med given prior to discharge. Pt stable at time of discharge.  [name] W Glorene Leitzke

## 2018-08-18 NOTE — Discharge Summary (Signed)
Physician Discharge Summary  Patient ID: [name] MRN: [id number] DOB/AGE: [birth year] 68 y.o.  Admit date: 07/26/2018 Discharge date: 08/18/2018  Discharge Diagnoses:  Principal Problem:   Nontraumatic thalamic hemorrhage ([place]) Active Problems:   HTN (hypertension)   Tobacco abuse   Hiatal hernia with gastroesophageal reflux disease and esophagitis   Hepatitis C   Acute on chronic anemia   Pain   Discharged Condition: stable   Significant Diagnostic Studies: Ct Angio Head W Or Wo Contrast  Result Date: 07/21/2018 CLINICAL DATA:  Followup intracranial hemorrhage. EXAM: CT ANGIOGRAPHY HEAD AND NECK TECHNIQUE: Multidetector CT imaging of the head and neck was performed using the standard protocol during bolus administration of intravenous contrast. Multiplanar CT image reconstructions and MIPs were obtained to evaluate the vascular anatomy. Carotid stenosis measurements (when applicable) are obtained utilizing NASCET criteria, using the distal internal carotid diameter as the denominator. CONTRAST:  157mL ISOVUE-370 IOPAMIDOL (ISOVUE-370) INJECTION 76% COMPARISON:  Head CT same day.  MR angiography 06/03/2015. FINDINGS: CTA NECK FINDINGS Aortic arch: Aortic atherosclerosis. No aneurysm or dissection. Branching pattern of the brachiocephalic vessels is normal without origin stenosis. Right carotid system: Common carotid artery shows some atherosclerotic plaque but is widely patent to the bifurcation region. There is soft and calcified plaque at the carotid bifurcation and ICA bulb but there is no stenosis. Cervical ICA is widely patent beyond that. Left carotid system: Common carotid artery widely patent to the bifurcation. There is soft and calcified plaque at the carotid bifurcation and ICA bulb. There is no stenosis. Cervical ICA widely patent beyond that. Vertebral arteries: Both vertebral artery origins are widely patent. Both vertebral arteries are widely patent through the cervical  region to the foramen magnum. Skeleton: Ordinary cervical spondylosis. Other neck: No mass or lymphadenopathy. Upper chest: Mild apical scarring.  No significant or active lesion. Review of the MIP images confirms the above findings CTA HEAD FINDINGS Anterior circulation: Both internal carotid arteries are patent through the skull base and siphon regions. There is atherosclerotic calcification in the carotid siphon regions but no stenosis. The anterior and middle cerebral vessels are patent without stenosis, aneurysm or vascular malformation. No vascular lesions seen in the region of the left lateral thalamus/posterior limb internal capsule hemorrhage. Posterior circulation: Both vertebral arteries are patent through the foramen magnum. There is atherosclerotic disease in both V4 segments. 25% stenosis on the right. 50% stenosis on the left. Both vertebral arteries reach the basilar. No basilar stenosis. Posterior circulation branch vessels appear normal. Venous sinuses: Patent and normal. Anatomic variants: None significant. Delayed phase: No abnormal enhancement. Review of the MIP images confirms the above findings IMPRESSION: Atherosclerotic disease at both carotid bifurcations and ICA bulb regions. No stenosis. 25% stenosis of the right V4 segment. 50% stenosis of the left V4 segment. No evidence of abnormal vessel in the region of the left lateral thalamus/posterior limb internal capsule hemorrhage. Electronically Signed   By: Nelson Chimes M.D.   On: 07/21/2018 21:42   Ct Head Wo Contrast  Result Date: 07/21/2018 CLINICAL DATA:  Intracranial hemorrhage follow up EXAM: CT HEAD WITHOUT CONTRAST TECHNIQUE: Contiguous axial images were obtained from the base of the skull through the vertex without intravenous contrast. COMPARISON:  07/20/2018 FINDINGS: Brain: Unchanged hemorrhage within the left thalamus. Mild surrounding edema. The size and configuration of the ventricles and extra-axial CSF spaces are normal.  There is hypoattenuation of the periventricular white matter, most commonly indicating chronic ischemic microangiopathy. Vascular: No abnormal hyperdensity of the major intracranial  arteries or dural venous sinuses. No intracranial atherosclerosis. Skull: The visualized skull base, calvarium and extracranial soft tissues are normal. Sinuses/Orbits: No fluid levels or advanced mucosal thickening of the visualized paranasal sinuses. No mastoid or middle ear effusion. The orbits are normal. IMPRESSION: Unchanged hemorrhage within the left thalamus with mild surrounding edema. Electronically Signed   By: Ulyses Jarred M.D.   On: 07/21/2018 00:45   Ct Angio Neck W Or Wo Contrast  Result Date: 07/21/2018 CLINICAL DATA:  Followup intracranial hemorrhage. EXAM: CT ANGIOGRAPHY HEAD AND NECK TECHNIQUE: Multidetector CT imaging of the head and neck was performed using the standard protocol during bolus administration of intravenous contrast. Multiplanar CT image reconstructions and MIPs were obtained to evaluate the vascular anatomy. Carotid stenosis measurements (when applicable) are obtained utilizing NASCET criteria, using the distal internal carotid diameter as the denominator. CONTRAST:  188mL ISOVUE-370 IOPAMIDOL (ISOVUE-370) INJECTION 76% COMPARISON:  Head CT same day.  MR angiography 06/03/2015. FINDINGS: CTA NECK FINDINGS Aortic arch: Aortic atherosclerosis. No aneurysm or dissection. Branching pattern of the brachiocephalic vessels is normal without origin stenosis. Right carotid system: Common carotid artery shows some atherosclerotic plaque but is widely patent to the bifurcation region. There is soft and calcified plaque at the carotid bifurcation and ICA bulb but there is no stenosis. Cervical ICA is widely patent beyond that. Left carotid system: Common carotid artery widely patent to the bifurcation. There is soft and calcified plaque at the carotid bifurcation and ICA bulb. There is no stenosis. Cervical  ICA widely patent beyond that. Vertebral arteries: Both vertebral artery origins are widely patent. Both vertebral arteries are widely patent through the cervical region to the foramen magnum. Skeleton: Ordinary cervical spondylosis. Other neck: No mass or lymphadenopathy. Upper chest: Mild apical scarring.  No significant or active lesion. Review of the MIP images confirms the above findings CTA HEAD FINDINGS Anterior circulation: Both internal carotid arteries are patent through the skull base and siphon regions. There is atherosclerotic calcification in the carotid siphon regions but no stenosis. The anterior and middle cerebral vessels are patent without stenosis, aneurysm or vascular malformation. No vascular lesions seen in the region of the left lateral thalamus/posterior limb internal capsule hemorrhage. Posterior circulation: Both vertebral arteries are patent through the foramen magnum. There is atherosclerotic disease in both V4 segments. 25% stenosis on the right. 50% stenosis on the left. Both vertebral arteries reach the basilar. No basilar stenosis. Posterior circulation branch vessels appear normal. Venous sinuses: Patent and normal. Anatomic variants: None significant. Delayed phase: No abnormal enhancement. Review of the MIP images confirms the above findings IMPRESSION: Atherosclerotic disease at both carotid bifurcations and ICA bulb regions. No stenosis. 25% stenosis of the right V4 segment. 50% stenosis of the left V4 segment. No evidence of abnormal vessel in the region of the left lateral thalamus/posterior limb internal capsule hemorrhage. Electronically Signed   By: Nelson Chimes M.D.   On: 07/21/2018 21:42   Dg Shoulder Left  Result Date: 07/27/2018 CLINICAL DATA:  Generalized left shoulder pain and limited range of motion after having a stroke this week. No reported injury. EXAM: LEFT SHOULDER - 2+ VIEW COMPARISON:  None. FINDINGS: No fracture or bone lesion. Glenohumeral AC joints are  normally aligned. No significant degenerative/arthropathic change. Bones are demineralized. Surrounding soft tissues are unremarkable. IMPRESSION: No fracture, bone lesion or significant joint abnormality. Electronically Signed   By: Lajean Manes M.D.   On: 07/27/2018 13:59   Ct Head Code Stroke Wo Contrast  Result  Date: 07/20/2018 CLINICAL DATA:  Code stroke. Combative with confusion. Hypertensive patient with RIGHT arm weakness. EXAM: CT HEAD WITHOUT CONTRAST TECHNIQUE: Contiguous axial images were obtained from the base of the skull through the vertex without intravenous contrast. COMPARISON:  01/10/2018. FINDINGS: Motion degraded scan.  Overall study diagnostic. Brain: There is a 16 x 15 x 22 mm acute hemorrhage, approximate volume 3 mL, involving the LEFT lateral superior thalamus extending into the periventricular/posterior lentiform nucleus region. Mild surrounding edema. No midline shift. No intraventricular or subarachnoid extension. Premature for age atrophy. Chronic microvascular ischemic change of the periventricular and subcortical white matter. Vascular: Calcification of the cavernous internal carotid arteries consistent with cerebrovascular atherosclerotic disease. No signs of intracranial large vessel occlusion. Skull: Calvarium intact. Sinuses/Orbits: No significant sinus disease. Negative orbits. Other: None. Compared with 01/10/2018, the degree of atrophy is similar. The bleed is acute. ASPECTS [facility] Stroke Program Early CT Score) Not applicable due to bleed. IMPRESSION: 1. 16 x 15 x 22 mm acute hemorrhage involving the LEFT lateral superior thalamus extending into the periventricular/posterior lentiform nucleus region. Mild surrounding edema. No midline shift. Hypertensive etiology is likely. 2. ASPECTS is not applicable due to bleed. 3. Critical Value/emergent results were called by telephone at the time of interpretation on 07/20/2018 at 4:28 pm to Dr. Amie Portland , who verbally  acknowledged these results. * Electronically Signed   By: Staci Righter M.D.   On: 07/20/2018 16:33    Labs:  Basic Metabolic Panel: BMP Latest Ref Rng & Units 08/14/2018 08/07/2018 07/31/2018  Glucose 70 - 99 mg/dL 100(H) 108(H) 104(H)  BUN 8 - 23 mg/dL 18 15 12   Creatinine 0.61 - 1.24 mg/dL 1.04 1.02 1.08  BUN/Creat Ratio 10 - 24 - - -  Sodium 135 - 145 mmol/L 138 140 139  Potassium 3.5 - 5.1 mmol/L 3.9 4.2 3.9  Chloride 98 - 111 mmol/L 109 107 109  CO2 22 - 32 mmol/L 19(L) 23 24  Calcium 8.9 - 10.3 mg/dL 8.8(L) 8.9 8.7(L)    CBC: CBC Latest Ref Rng & Units 08/14/2018 08/07/2018 08/03/2018  WBC 4.0 - 10.5 K/uL 5.7 6.2 6.5  Hemoglobin 13.0 - 17.0 g/dL 8.5(L) 8.4(L) 7.5(L)  Hematocrit 39.0 - 52.0 % 29.3(L) 29.1(L) 25.7(L)  Platelets 150 - 400 K/uL 417(H) 497(H) 417(H)    CBG: No results for input(s): GLUCAP in the last 168 hours.  Brief HPI:   [name] is a 68 year old male with history of prior CVA with residual left hemiparesis, hep C, HTN, polysubstance abuse; who was admitted on 07/20/2018 with worsening of slurred speech, right-sided weakness and right facial droop.  Patient was confused and agitated at admission with elevated blood pressure and UDS positive for THC.  CT head done revealing left thalamic hemorrhage with chronic ischemic microangiopathy.  CTA head/neck revealed atherosclerotic disease at both carotid bifurcation without stenosis, 25% stenosis right V4 and 50% stenosis left V4.  Dr. Leonie Man felt that bleed was hypertensive in nature and aspirin was discontinued.  He was treated with CIWA protocol for agitation felt to be due to alcohol withdrawal.  Mentation was improving but he continued to be limited by moderate cognitive deficits, right visual field deficits, right hemiplegia affecting mobility as well as ADLs.  CIR was recommended due to functional deficits   Hospital Course: [name] was admitted to rehab 07/26/2018 for inpatient therapies to consist  of PT, ST and OT at least three hours five days a week. Past admission physiatrist, therapy team  and rehab RN have worked together to provide customized collaborative inpatient rehab. Due to history of chronic back pain and headaches, tramadol was added to help with therapy and activity tolerance.  He also had complaints of significant left shoulder pain due to DJD/rotator cuff and hemiplegia.  Left subacromial space was injected on 1/21 and this as well as ROM has been utilized to help with symptoms. Wellbutrin was added to help with tobacco cessation.  He continued to have issues with insomnia therefore low-dose Seroquel was added to trazodone and Ambien to help with sleep hygiene  Serial CBC showed drop in H&H to 6.9/24.1 as well as heme positive stools.  He was transfused with 2 units packed red blood cells and [name] was consulted for input.  Patient with evidence of iron deficiency anemia and he reported having been out of iron supplements for couple of months as well as use of NSAIDs prior to admission.  He received Feraheme infusion weekly x2 for iron deficiency anemia.  Dr. Carlean Purl felt that chronic recurrent blood loss anemia was most likely from hiatal hernia as well as [name] ulcers and did not feel the need for endoscopic evaluation--to follow up with GI after discharge.  Follow-up CBC shows serial H&H is stable.  Serial BMET shows lites as well as renal status within normal limits.  He has made slow, steady progress and is at supervision/min assist at discharge. He will continue to receive further follow-up HHPT, [place] and HHST by advanced home care after discharge   Rehab course: During patient's stay in rehab weekly team conferences were held to monitor patient's progress, set goals and discuss barriers to discharge. At admission, patient required max assist with basic self-care task and max assist with mobility. He exhibited moderate cognitive impairments affecting attention memory  semi-complex problem-solving as well as emergent and anticipatory awareness.. He  has had improvement in activity tolerance, balance, postural control as well as ability to compensate for deficits. He has had improvement in functional use RUE  and RLE  as well as improvement in awareness. He requires supervision to min assist for ADL tasks.  He requires min assist for transfers and is able to ambulate 50 feet with rolling walker and min assist.  He requires supervisory verbal cues to complete functional and  semi-complex tasks with use of compensatory strategies.  He is showing improvement in speech intelligibility and speech is 100% intelligible with verbal cues.  Family education was completed regarding all aspects of care and safety    Disposition: Home  Diet: Heart healthy diet  Special Instructions: 1.  Needs supervision/assistance with all activity. 2.  Needs to abstain from alcohol and avoid NSAIDs (Aleve, ibuprofen, Motrin) 3.  Needs to follow-up with Dr. Pincus Sanes after discharge  Discharge Instructions    Ambulatory referral to Physical Medicine Rehab   Complete by:  As directed    1-2 weeks transitional care appt     Allergies as of 08/18/2018      Reactions   Penicillins Anaphylaxis   Has patient had a PCN reaction causing immediate rash, facial/tongue/throat swelling, SOB or lightheadedness with hypotension: Yes Has patient had a PCN reaction causing severe rash involving mucus membranes or skin necrosis: No Has patient had a PCN reaction that required hospitalization: No Has patient had a PCN reaction occurring within the last 10 years: No If all of the above answers are "NO", then may proceed with Cephalosporin use.[name] [pseudoephedrine] Other (See Comments)   Heart "  races"      Medication List    STOP taking these medications   DULoxetine 30 MG capsule Commonly known as:  CYMBALTA   HYDROcodone-acetaminophen 5-325 MG tablet Commonly known as:  NORCO/VICODIN    traZODone 100 MG tablet Commonly known as:  DESYREL     TAKE these medications   acetaminophen 325 MG tablet Commonly known as:  TYLENOL Take 2 tablets (650 mg total) by mouth 4 (four) times daily -  with meals and at bedtime.   ALPRAZolam 0.25 MG tablet--Rx # 30 pills Commonly known as:  XANAX Take 1 tablet (0.25 mg total) by mouth daily as needed for anxiety.   amLODipine 10 MG tablet Commonly known as:  NORVASC Take 1 tablet (10 mg total) by mouth daily.   buPROPion 75 MG tablet Commonly known as:  WELLBUTRIN Take 1 tablet (75 mg total) by mouth 2 (two) times daily.   cyclobenzaprine 5 MG tablet Commonly known as:  FLEXERIL Take 1 tablet (5 mg total) by mouth 2 (two) times daily as needed for muscle spasms.   ferrous sulfate 325 (65 FE) MG tablet TAKE 1 TABLET BY MOUTH THREE TWICE A DAY WITH MEALS What changed:    how much to take  how to take this  when to take this  additional instructions   hydrALAZINE 10 MG tablet Commonly known as:  APRESOLINE Take 1 tablet (10 mg total) by mouth at bedtime.   lidocaine 5 % Commonly known as:  LIDODERM Place 1 patch onto the skin daily. Apply to left hip at 7 am and remove at 7 pm daily.   lisinopril 40 MG tablet Commonly known as:  PRINIVIL,ZESTRIL Take 1 tablet (40 mg total) by mouth daily.   MUSCLE RUB 10-15 % Crea Apply 1 application topically 2 (two) times daily. To left shoulder and left hip   pantoprazole 40 MG tablet Commonly known as:  PROTONIX TAKE 1 TABLET BY MOUTH TWICE A DAY BEFORE A MEAL What changed:  See the new instructions.   QUEtiapine 50 MG tablet--Rx #30 pills  Commonly known as:  SEROQUEL Take 1 tablet (50 mg total) by mouth at bedtime. What changed:    medication strength  how much to take  when to take this   senna-docusate 8.6-50 MG tablet Commonly known as:  Senokot-S Take 1 tablet by mouth 2 (two) times daily.   sodium chloride 0.65 % Soln nasal spray Commonly known as:   OCEAN Place 1 spray into both nostrils as needed for congestion.   traMADol 50 MG tablet--Rx # 10 pills Commonly known as:  ULTRAM Take 1 tablet (50 mg total) by mouth every 12 (twelve) hours as needed for severe pain (shouder pain).   zolpidem 5 MG table--Rx #30 pills  Commonly known as:  AMBIEN Take 1 tablet (5 mg total) by mouth at bedtime.      Follow-up Information    Meredith Staggers, MD Follow up.   Specialty:  Physical Medicine and Rehabilitation Why:  Office will call you with follow up appointment Contact information: [address] [place] [address] [phone]        Garvin Fila, MD. Call.   Specialties:  Neurology, Radiology Why:  for follow up appointment Contact information: [address] [place] [address] [phone]        Billie Ruddy, MD Follow up on 08/25/2018.   Specialty:  Family Medicine Why:  @ 1:30 pm (hospital follow up  appt) Contact information: [place] [address] [phone]           Signed: Bary Leriche 08/18/2018, 2:52 PM

## 2018-08-18 NOTE — Plan of Care (Signed)
  Problem: Consults Goal: RH STROKE PATIENT EDUCATION Description See Patient Education module for education specifics  Outcome: Progressing   Problem: RH BOWEL ELIMINATION Goal: RH STG MANAGE BOWEL WITH ASSISTANCE Description STG Manage Bowel with [place].  Outcome: Progressing   Problem: RH BLADDER ELIMINATION Goal: RH STG MANAGE BLADDER WITH ASSISTANCE Description STG Manage Bladder With Min Assistance  Outcome: Progressing   Problem: RH SKIN INTEGRITY Goal: RH STG SKIN FREE OF INFECTION/BREAKDOWN Description No new breakdown with min assist   Outcome: Progressing Goal: RH STG MAINTAIN SKIN INTEGRITY WITH ASSISTANCE Description STG Maintain Skin Integrity With [place].  Outcome: Progressing   Problem: RH SAFETY Goal: RH STG ADHERE TO SAFETY PRECAUTIONS W/ASSISTANCE/DEVICE Description STG Adhere to Safety Precautions With min Assistance/Device.  Outcome: Progressing   Problem: RH COGNITION-NURSING Goal: RH STG USES MEMORY AIDS/STRATEGIES W/ASSIST TO PROBLEM SOLVE Description STG Uses Memory Aids/Strategies With Min Assistance to Problem Solve.  Outcome: Progressing Goal: RH STG ANTICIPATES NEEDS/CALLS FOR ASSIST W/ASSIST/CUES Description STG Anticipates Needs/Calls for Assist With Min Assistance/Cues.  Outcome: Progressing   Problem: RH PAIN MANAGEMENT Goal: RH STG PAIN MANAGED AT OR BELOW PT'S PAIN GOAL Description <3 out of 10.   Outcome: Progressing   Problem: Spiritual Needs Goal: Ability to function at adequate level Outcome: Progressing

## 2018-08-18 NOTE — Progress Notes (Signed)
Social Work  Discharge Note  The overall goal for the admission was met for:   Discharge location: Yes - home with fiance who can provide 24/7 assistance  Length of Stay: Yes - 23 days  Discharge activity level: Yes - supervision  Home/community participation: Yes  Services provided included: MD, RD, PT, OT, SLP, RN, TR, Pharmacy, [place]: [employer] Medicare and Medicaid - MQB  Follow-up services arranged: Home Health: PT, OT, ST via [place], DME: 18x18 lightweight w/c, cushion, rolling walker via [facility] and Patient/Family has no preference for HH/DME agencies  Comments (or additional information):  Contact #s:  Patient @ [phone]             Glenna Fellows @ [phone]  Patient/Family verbalized understanding of follow-up arrangements: Yes  Individual responsible for coordination of the follow-up plan: pt  Confirmed correct DME delivered: Lennart Pall 08/18/2018    Diyari Cherne

## 2018-08-18 NOTE — Progress Notes (Signed)
[facility] PHYSICAL MEDICINE & REHABILITATION PROGRESS NOTE   Subjective/Complaints: Up in w/c ready to go. Smiling!  ROS: Patient denies fever, rash, sore throat, blurred vision, nausea, vomiting, diarrhea, cough, shortness of breath or chest pain, joint or back pain, headache, or mood change.    Objective:   No results found. No results for input(s): WBC, HGB, HCT, PLT in the last 72 hours. No results for input(s): NA, K, CL, CO2, GLUCOSE, BUN, CREATININE, CALCIUM in the last 72 hours.  Intake/Output Summary (Last 24 hours) at 08/18/2018 0927 Last data filed at 08/18/2018 0800 Gross per 24 hour  Intake 360 ml  Output 1000 ml  Net -640 ml     Physical Exam: Vital Signs Blood pressure 130/66, pulse (!) 56, temperature 98.6 F (37 C), temperature source Oral, resp. rate 18, height 5\' 9"  (1.753 m), weight 92.3 kg, SpO2 100 %.  Constitutional: No distress. Vital signs reviewed. HEENT: EOMI, oral membranes moist Neck: supple Cardiovascular: RRR without murmur. No JVD    Respiratory: CTA Bilaterally without wheezes or rales. Normal effort    GI: BS +, non-tender, non-distended  Musc:Left shoulder tender Neurological:  dysarthric Follows basic commands. Fair insight. STM deficits Motor:  LUE: 2+/5 prox to 4-/5 distally--no change  LLE: HF, KE 3+/5, ADF 4/5-no change Skin: Skin is warm and dry.   Psychiatric: pleasant    Assessment/Plan: 1. Functional deficits secondary to left thalamic hemorrhage which require 3+ hours per day of interdisciplinary therapy in a comprehensive inpatient rehab setting.  Physiatrist is providing close team supervision and 24 hour management of active medical problems listed below.  Physiatrist and rehab team continue to assess barriers to discharge/monitor patient progress toward functional and medical goals  Care Tool:  Bathing  Bathing activity did not occur: Refused Body parts bathed by patient: Right arm, Left arm, Chest, Abdomen,  Buttocks, Front perineal area, Left upper leg, Right upper leg, Right lower leg, Left lower leg, Face   Body parts bathed by helper: Buttocks     Bathing assist Assist Level: Set up assist     Upper Body Dressing/Undressing Upper body dressing Upper body dressing/undressing activity did not occur (including orthotics): Refused What is the patient wearing?: Pull over shirt    Upper body assist Assist Level: Set up assist    Lower Body Dressing/Undressing Lower body dressing    Lower body dressing activity did not occur: Refused What is the patient wearing?: Underwear/pull up, Pants     Lower body assist Assist for lower body dressing: Supervision/Verbal cueing     Toileting Toileting Toileting Activity did not occur (Clothing management and hygiene only): N/A (no void or bm)  Toileting assist Assist for toileting: Contact Guard/Touching assist     Transfers Chair/bed transfer  Transfers assist     Chair/bed transfer assist level: Minimal Assistance - Patient > 75%     Locomotion Ambulation   Ambulation assist      Assist level: Minimal Assistance - Patient > 75% Assistive device: Walker-rolling Max distance: 50 ft   Walk 10 feet activity   Assist     Assist level: Minimal Assistance - Patient > 75% Assistive device: Walker-rolling   Walk 50 feet activity   Assist Walk 50 feet with 2 turns activity did not occur: Safety/medical concerns  Assist level: Minimal Assistance - Patient > 75% Assistive device: Walker-rolling    Walk 150 feet activity   Assist Walk 150 feet activity did not occur: Safety/medical concerns  Assist level: Minimal  Assistance - Patient > 75% Assistive device: Walker-rolling    Walk 10 feet on uneven surface  activity   Assist Walk 10 feet on uneven surfaces activity did not occur: Safety/medical concerns         Wheelchair     Assist Will patient use wheelchair at discharge?: Yes Type of Wheelchair:  Manual    Wheelchair assist level: Supervision/Verbal cueing Max wheelchair distance: 150    Wheelchair 50 feet with 2 turns activity    Assist    Wheelchair 50 feet with 2 turns activity did not occur: Safety/medical concerns   Assist Level: Supervision/Verbal cueing   Wheelchair 150 feet activity     Assist Wheelchair 150 feet activity did not occur: Refused   Assist Level: Supervision/Verbal cueing     Medical Problem List and Plan: 1.  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  -dc home today  -Patient to see Rehab MD/provider in the office for transitional care encounter in 1-2 weeks.  2.  DVT Prophylaxis/Anticoagulation: Pharmaceutical: Lovenox 3.  History of chronic back pain/headaches/pain Management: Tylenol as needed for now  -Patient status post left shoulder injection/subacromial. Some improvement in  Pain but ROM still limited.   -cont tramadol as pain is limiting with activity  4. Mood: LCSW to follow for evaluation and support  -continue low dose xanax prn 5. Neuropsych: This patient is not fully capable of making decisions on his own behalf. 6. Skin/Wound Care: Routine pressure relief measures 7. Fluids/Electrolytes/Nutrition: encourage PO        8.  HTN: Monitor blood pressures. Continue amlodipine and lisinopril-- continue to titrate medications to control.   Hydralazine 10 nightly started on 1/12 with improvement 9.  Acute on chronic blood loss anemia/HH with gastritis:   hgb holding at 8.5 1/27  Hemoccult positive   -continue iron supplement  -iron studies all c/w iron deficiency anemia  GI consulted, infusion ordered x 2  -no other intervention recommended at present---outpt follow up    10. Chronic Insomnia/sleep state misperception:    Ttrazodone 100 mg nightly was ineffective--resumed home regimen of trazodone 50mg   and Ambien 5 mg--->pharmacy is limiting him to only 5 mg due to  his age.  However patient uses 10 mg at home.  -continue 50mg  seroquel with 5mg  [name] been most effective 11. Tobacco dependence:     -Continue trial of wellbutrin 75mg   bid 1/24     LOS: 23 days A FACE TO [place] 08/18/2018, 9:27 AM

## 2018-08-18 NOTE — Plan of Care (Signed)
  Problem: Consults Goal: RH STROKE PATIENT EDUCATION Description See Patient Education module for education specifics  Outcome: Progressing   Problem: RH BOWEL ELIMINATION Goal: RH STG MANAGE BOWEL WITH ASSISTANCE Description STG Manage Bowel with [place].  Outcome: Progressing   Problem: RH BLADDER ELIMINATION Goal: RH STG MANAGE BLADDER WITH ASSISTANCE Description STG Manage Bladder With Min Assistance  Outcome: Progressing   Problem: RH SKIN INTEGRITY Goal: RH STG SKIN FREE OF INFECTION/BREAKDOWN Description No new breakdown with min assist   Outcome: Progressing Goal: RH STG MAINTAIN SKIN INTEGRITY WITH ASSISTANCE Description STG Maintain Skin Integrity With [place].  Outcome: Progressing   Problem: RH SAFETY Goal: RH STG ADHERE TO SAFETY PRECAUTIONS W/ASSISTANCE/DEVICE Description STG Adhere to Safety Precautions With min Assistance/Device.  Outcome: Progressing

## 2018-08-21 ENCOUNTER — Telehealth: Payer: Self-pay

## 2018-08-21 NOTE — Telephone Encounter (Signed)
yes

## 2018-08-21 NOTE — Telephone Encounter (Signed)
Called her back and approved verbal orders and SW with sandra, care Freight forward[employer]

## 2018-08-21 NOTE — Telephone Encounter (Signed)
Amber from [facility] called requesting verbal orders for PT 2wk3 and a SW consult. Is it okay for him to have a SW?

## 2018-08-22 ENCOUNTER — Telehealth: Payer: Self-pay

## 2018-08-22 ENCOUNTER — Telehealth: Payer: Self-pay | Admitting: Family Medicine

## 2018-08-22 NOTE — Telephone Encounter (Signed)
Ebony Hail, SLP from [facility] called requesting HHST 2wk2. Orders approved and given per discharge summary.

## 2018-08-22 NOTE — Telephone Encounter (Signed)
Copied from [place] [phone]. Topic: General - Other >> Aug 22, 2018  4:48 PM Alfredia Ferguson R wrote: Reason for [id number] from [place] is calling in to report a fall. He didn't sustain any injuries didn't obtain any injuries , tried to walk by himself due to frustration from the stroke

## 2018-08-22 NOTE — Telephone Encounter (Signed)
No TCM call needed today as he does not qualify.  Pt is however scheduled with Dr Volanda Napoleon on 08/25/2018 Nothing further needed.

## 2018-08-25 ENCOUNTER — Encounter: Payer: Self-pay | Admitting: Family Medicine

## 2018-08-25 ENCOUNTER — Ambulatory Visit (INDEPENDENT_AMBULATORY_CARE_PROVIDER_SITE_OTHER): Payer: Medicare Other | Admitting: Family Medicine

## 2018-08-25 ENCOUNTER — Encounter: Payer: Self-pay | Admitting: *Deleted

## 2018-08-25 ENCOUNTER — Telehealth: Payer: Self-pay | Admitting: *Deleted

## 2018-08-25 VITALS — BP 120/62 | HR 62 | Temp 98.2°F | Wt 211.0 lb

## 2018-08-25 DIAGNOSIS — I639 Cerebral infarction, unspecified: Secondary | ICD-10-CM

## 2018-08-25 DIAGNOSIS — Z09 Encounter for follow-up examination after completed treatment for conditions other than malignant neoplasm: Secondary | ICD-10-CM

## 2018-08-25 DIAGNOSIS — I10 Essential (primary) hypertension: Secondary | ICD-10-CM

## 2018-08-25 DIAGNOSIS — Z720 Tobacco use: Secondary | ICD-10-CM | POA: Diagnosis not present

## 2018-08-25 DIAGNOSIS — G8929 Other chronic pain: Secondary | ICD-10-CM

## 2018-08-25 MED ORDER — TRAMADOL HCL 50 MG PO TABS: 50.0000 mg | ORAL_TABLET | Freq: Two times a day (BID) | ORAL | 0 refills | Status: DC | PRN

## 2018-08-25 NOTE — Telephone Encounter (Signed)
Manuela Schwartz OT with [facility] called and requested 1wk1, 2wk3.  Approval given.

## 2018-08-25 NOTE — Patient Instructions (Signed)
Rehabilitation After a Stroke, Adult A stroke causes damage to the brain cells, which can affect your ability to walk, talk, or remember things. The impact of a stroke is different for everyone, and so is recovery. Some people have progress during the first few days after treatment. Others may take weeks or longer to make progress. Stroke rehabilitation includes a variety of treatments to help you recover and promote your independence after a stroke. You may not be able do everything that you did before the stroke, but you can learn ways to manage your lifestyle and be as independent as possible. Rehabilitation will start as soon as you are able to participate after your stroke, and it involves care from a team that may include:  Family and friends. Your loved ones know you best and can be very helpful in your recovery.  Physicians.  Nurses.  Physical and occupational therapists.  Speech-language therapists.  A nutritionist.  A psychologist.  A social worker. Keep open communication with all members of your care team. Share your medical records if needed, and take notes about each provider's recommendations. What is physical therapy? Physical therapists (PTs) help you to improve your coordination, balance, and muscle strength. Physical therapy may involve:  Range of motion exercises.  Help to move between lying, sitting, and standing positions.  Walking with a cane or walker, if needed.  Help using stairs. What is occupational therapy? Occupational therapists (OTs) help you rebuild your ability to do everyday tasks, such as brushing your teeth, going to the bathroom, eating, and getting dressed. Occupational therapy may also help with:  Vision. Visual scanning is a technique that is used to prevent falls.  Memory and cognitive training. This therapy includes problem-solving techniques and relearning tasks like making a phone call.  Fine muscle movements such as buttoning a shirt  or picking up small objects. What is speech therapy? Speech-language therapists help you communicate. After a stroke, you may have problems understanding what people are saying, or you may have trouble writing, speaking, or finding the right word for what you want to say. You may also need speech therapy if you have difficulty swallowing while eating and drinking. Examples of speech-language therapies include:  Techniques to strengthen muscles used in swallowing.  Naming objects or describing pictures. This helps retrain the brain to recognize and remember words.  Exercises to strengthen the muscles involved in talking, including your tongue and lips.  Exercises to retrain your brain in understanding what you read and hear. How often will I need therapy? Therapy will begin as soon as you are able to participate, which is often within the first few days after a stroke. Sessions will be frequent at first. For example, you may have therapy 2-3 hours a day on most days of the week during the first few months. The intensity depends on the type and severity of your stroke. You may need therapy for several months. Therapy may take place in the hospital, at a rehabilitation center, or in your home. Are there any side effects of therapy? Therapy is safe and is usually well-tolerated. You may feel physically and mentally tired after therapy, especially during the first few weeks. Rest before therapy sessions if you need to so you can get the most out of your rehabilitation. Follow these instructions at home:  Involve your family and friends in your recovery, if possible. Having another person to encourage you is beneficial.  Follow instructions from your speech-language therapist, nutritionist, or health care   provider about what you can safely eat and drink. Eat healthy foods. If your ability to swallow was affected by the stroke, you may need to take steps to avoid choking, such as: ? Taking small bites  when eating. ? Eating foods that are soft or pureed. ? Drinking liquids that have been thickened.  Maintain social connections and interactions with friends, family, and community groups. This is an important part of your recovery. Communication challenges and physical challenges may cause you to feel isolated after a stroke.  Consider joining a support group that allows you to talk about the impact of stroke on your life. A psychologist or counselor may be recommended. Your emotional recovery from stroke is just as important as your physical recovery.  Keep all follow-up visits as told by your health care providers. This is important. Summary  Stroke rehabilitation includes a variety of treatments to help you recover and promote your independence after a stroke.  Rehabilitation will start as soon as you are able to participate after your stroke, and it includes care from a team of experts.  The intensity of therapy depends on the type and severity of your stroke. You may need therapy for several months. This information is not intended to replace advice given to you by your health care provider. Make sure you discuss any questions you have with your health care provider. Document Released: 07/25/2007 Document Revised: 03/25/2018 Document Reviewed: 07/06/2016 Elsevier Interactive Patient Education  2019 [place] DASH stands for "Dietary Approaches to Stop Hypertension." The DASH eating plan is a healthy eating plan that has been shown to reduce high blood pressure (hypertension). It may also reduce your risk for type 2 diabetes, heart disease, and stroke. The DASH eating plan may also help with weight loss. What are tips for following this plan?  General guidelines  Avoid eating more than 2,300 mg (milligrams) of salt (sodium) a day. If you have hypertension, you may need to reduce your sodium intake to 1,500 mg a day.  Limit alcohol intake to no more than 1 drink  a day for nonpregnant women and 2 drinks a day for men. One drink equals 12 oz of beer, 5 oz of wine, or 1 oz of hard liquor.  Work with your health care provider to maintain a healthy body weight or to lose weight. Ask what an ideal weight is for you.  Get at least 30 minutes of exercise that causes your heart to beat faster (aerobic exercise) most days of the week. Activities may include walking, swimming, or biking.  Work with your health care provider or diet and nutrition specialist (dietitian) to adjust your eating plan to your individual calorie needs. Reading food labels   Check food labels for the amount of sodium per serving. Choose foods with less than 5 percent of the Daily Value of sodium. Generally, foods with less than 300 mg of sodium per serving fit into this eating plan.  To find whole grains, look for the word "whole" as the first word in the ingredient list. Shopping  Buy products labeled as "low-sodium" or "no salt added."  Buy fresh foods. Avoid canned foods and premade or frozen meals. Cooking  Avoid adding salt when cooking. Use salt-free seasonings or herbs instead of table salt or sea salt. Check with your health care provider or pharmacist before using salt substitutes.  Do not fry foods. Cook foods using healthy methods such as baking, boiling, grilling, and broiling instead.  Cook with heart-healthy oils, such as olive, canola, soybean, or sunflower oil. Meal planning  Eat a balanced diet that includes: ? 5 or more servings of fruits and vegetables each day. At each meal, try to fill half of your plate with fruits and vegetables. ? Up to 6-8 servings of whole grains each day. ? Less than 6 oz of lean meat, poultry, or fish each day. A 3-oz serving of meat is about the same size as a deck of cards. One egg equals 1 oz. ? 2 servings of low-fat dairy each day. ? A serving of nuts, seeds, or beans 5 times each week. ? Heart-healthy fats. Healthy fats called  Omega-3 fatty acids are found in foods such as flaxseeds and coldwater fish, like sardines, salmon, and mackerel.  Limit how much you eat of the following: ? Canned or prepackaged foods. ? Food that is high in trans fat, such as fried foods. ? Food that is high in saturated fat, such as fatty meat. ? Sweets, desserts, sugary drinks, and other foods with added sugar. ? Full-fat dairy products.  Do not salt foods before eating.  Try to eat at least 2 vegetarian meals each week.  Eat more home-cooked food and less restaurant, buffet, and fast food.  When eating at a restaurant, ask that your food be prepared with less salt or no salt, if possible. What foods are recommended? The items listed may not be a complete list. Talk with your dietitian about what dietary choices are best for you. Grains Whole-grain or whole-wheat bread. Whole-grain or whole-wheat pasta. Brown rice. [place]. Bulgur. Whole-grain and low-sodium cereals. Pita bread. Low-fat, low-sodium crackers. Whole-wheat flour tortillas. Vegetables Fresh or frozen vegetables (raw, steamed, roasted, or grilled). Low-sodium or reduced-sodium tomato and vegetable juice. Low-sodium or reduced-sodium tomato sauce and tomato paste. Low-sodium or reduced-sodium canned vegetables. Fruits All fresh, dried, or frozen fruit. Canned fruit in natural juice (without added sugar). Meat and other protein foods Skinless chicken or [place]. Ground chicken or [place]. Pork with fat trimmed off. Fish and seafood. Egg whites. Dried beans, peas, or lentils. Unsalted nuts, nut butters, and seeds. Unsalted canned beans. Lean cuts of beef with fat trimmed off. Low-sodium, lean deli meat. Dairy Low-fat (1%) or fat-free (skim) milk. Fat-free, low-fat, or reduced-fat cheeses. Nonfat, low-sodium ricotta or cottage cheese. Low-fat or nonfat yogurt. Low-fat, low-sodium cheese. Fats and oils Soft margarine without trans fats. Vegetable oil. Low-fat,  reduced-fat, or light mayonnaise and salad dressings (reduced-sodium). Canola, safflower, olive, soybean, and sunflower oils. Avocado. Seasoning and other foods Herbs. Spices. Seasoning mixes without salt. Unsalted popcorn and pretzels. Fat-free sweets. What foods are not recommended? The items listed may not be a complete list. Talk with your dietitian about what dietary choices are best for you. Grains Baked goods made with fat, such as croissants, muffins, or some breads. Dry pasta or rice meal packs. Vegetables Creamed or fried vegetables. Vegetables in a cheese sauce. Regular canned vegetables (not low-sodium or reduced-sodium). Regular canned tomato sauce and paste (not low-sodium or reduced-sodium). Regular tomato and vegetable juice (not low-sodium or reduced-sodium). Angie Fava. Olives. Fruits Canned fruit in a light or heavy syrup. Fried fruit. Fruit in cream or butter sauce. Meat and other protein foods Fatty cuts of meat. Ribs. Fried meat. Berniece Salines. Sausage. Bologna and other processed lunch meats. Salami. Fatback. Hotdogs. Bratwurst. Salted nuts and seeds. Canned beans with added salt. Canned or smoked fish. Whole eggs or egg yolks. Chicken or [place] with skin. Dairy Whole  or 2% milk, cream, and half-and-half. Whole or full-fat cream cheese. Whole-fat or sweetened yogurt. Full-fat cheese. Nondairy creamers. Whipped toppings. Processed cheese and cheese spreads. Fats and oils Butter. Stick margarine. Lard. Shortening. Ghee. Bacon fat. Tropical oils, such as coconut, palm kernel, or palm oil. Seasoning and other foods Salted popcorn and pretzels. Onion salt, garlic salt, seasoned salt, table salt, and sea salt. Worcestershire sauce. Tartar sauce. Barbecue sauce. Teriyaki sauce. Soy sauce, including reduced-sodium. Steak sauce. Canned and packaged gravies. Fish sauce. Oyster sauce. Cocktail sauce. Horseradish that you find on the shelf. Ketchup. Mustard. Meat flavorings and tenderizers.  Bouillon cubes. Hot sauce and Tabasco sauce. Premade or packaged marinades. Premade or packaged taco seasonings. Relishes. Regular salad dressings. Where to find more information:  National Heart, Lung, and [place]: https://wilson-eaton.com/  American Heart Association: www.heart.org Summary  The DASH eating plan is a healthy eating plan that has been shown to reduce high blood pressure (hypertension). It may also reduce your risk for type 2 diabetes, heart disease, and stroke.  With the DASH eating plan, you should limit salt (sodium) intake to 2,300 mg a day. If you have hypertension, you may need to reduce your sodium intake to 1,500 mg a day.  When on the DASH eating plan, aim to eat more fresh fruits and vegetables, whole grains, lean proteins, low-fat dairy, and heart-healthy fats.  Work with your health care provider or diet and nutrition specialist (dietitian) to adjust your eating plan to your individual calorie needs. This information is not intended to replace advice given to you by your health care provider. Make sure you discuss any questions you have with your health care provider. Document Released: 06/24/2011 Document Revised: 06/28/2016 Document Reviewed: 06/28/2016 Elsevier Interactive Patient Education  2019 [employer].  Steps to Quit Smoking  Smoking tobacco can be harmful to your health and can affect almost every organ in your body. Smoking puts you, and those around you, at risk for developing many serious chronic diseases. Quitting smoking is difficult, but it is one of the best things that you can do for your health. It is never too late to quit. What are the benefits of quitting smoking? When you quit smoking, you lower your risk of developing serious diseases and conditions, such as:  Lung cancer or lung disease, such as COPD.  Heart disease.  Stroke.  Heart attack.  Infertility.  Osteoporosis and bone fractures. Additionally, symptoms such as  coughing, wheezing, and shortness of breath may get better when you quit. You may also find that you get sick less often because your body is stronger at fighting off colds and infections. If you are pregnant, quitting smoking can help to reduce your chances of having a baby of low birth weight. How do I get ready to quit? When you decide to quit smoking, create a plan to make sure that you are successful. Before you quit:  Pick a date to quit. Set a date within the next two weeks to give you time to prepare.  Write down the reasons why you are quitting. Keep this list in places where you will see it often, such as on your bathroom mirror or in your car or wallet.  Identify the people, places, things, and activities that make you want to smoke (triggers) and avoid them. Make sure to take these actions: ? Throw away all cigarettes at home, at work, and in your car. ? Throw away smoking accessories, such as Scientist, research (medical). ? Clean  your car and make sure to empty the ashtray. ? Clean your home, including curtains and carpets.  Tell your family, friends, and coworkers that you are quitting. Support from your loved ones can make quitting easier.  Talk with your health care provider about your options for quitting smoking.  Find out what treatment options are covered by your health insurance. What strategies can I use to quit smoking? Talk with your healthcare provider about different strategies to quit smoking. Some strategies include:  Quitting smoking altogether instead of gradually lessening how much you smoke over a period of time. Research shows that quitting "cold [place]" is more successful than gradually quitting.  Attending in-person counseling to help you build problem-solving skills. You are more likely to have success in quitting if you attend several counseling sessions. Even short sessions of 10 minutes can be effective.  Finding resources and support systems that can help  you to quit smoking and remain smoke-free after you quit. These resources are most helpful when you use them often. They can include: ? Online chats with a Social worker. ? Telephone quitlines. ? Careers information officer. ? Support groups or group counseling. ? Text messaging programs. ? Mobile phone applications.  Taking medicines to help you quit smoking. (If you are pregnant or breastfeeding, talk with your health care provider first.) Some medicines contain nicotine and some do not. Both types of medicines help with cravings, but the medicines that include nicotine help to relieve withdrawal symptoms. Your health care provider may recommend: ? Nicotine patches, gum, or lozenges. ? Nicotine inhalers or sprays. ? Non-nicotine medicine that is taken by mouth. Talk with your health care provider about combining strategies, such as taking medicines while you are also receiving in-person counseling. Using these two strategies together makes you more likely to succeed in quitting than if you used either strategy on its own. If you are pregnant or breastfeeding, talk with your health care provider about finding counseling or other support strategies to quit smoking. Do not take medicine to help you quit smoking unless told to do so by your health care provider. What things can I do to make it easier to quit? Quitting smoking might feel overwhelming at first, but there is a lot that you can do to make it easier. Take these important actions:  Reach out to your family and friends and ask that they support and encourage you during this time. Call telephone quitlines, reach out to support groups, or work with a counselor for support.  Ask people who smoke to avoid smoking around you.  Avoid places that trigger you to smoke, such as bars, parties, or smoke-break areas at work.  Spend time around people who do not smoke.  Lessen stress in your life, because stress can be a smoking trigger for some  people. To lessen stress, try: ? Exercising regularly. ? Deep-breathing exercises. ? Yoga. ? Meditating. ? Performing a body scan. This involves closing your eyes, scanning your body from head to toe, and noticing which parts of your body are particularly tense. Purposefully relax the muscles in those areas.  Download or purchase mobile phone or tablet apps (applications) that can help you stick to your quit plan by providing reminders, tips, and encouragement. There are many free apps, such as QuitGuide from the [employer] Office manager for Disease Control and Prevention). You can find other support for quitting smoking (smoking cessation) through smokefree.gov and other websites. How will I feel when I quit smoking? Within the first  24 hours of quitting smoking, you may start to feel some withdrawal symptoms. These symptoms are usually most noticeable 2-3 days after quitting, but they usually do not last beyond 2-3 weeks. Changes or symptoms that you might experience include:  Mood swings.  Restlessness, anxiety, or irritation.  Difficulty concentrating.  Dizziness.  Strong cravings for sugary foods in addition to nicotine.  Mild weight gain.  Constipation.  Nausea.  Coughing or a sore throat.  Changes in how your medicines work in your body.  A depressed mood.  Difficulty sleeping (insomnia). After the first 2-3 weeks of quitting, you may start to notice more positive results, such as:  Improved sense of smell and taste.  Decreased coughing and sore throat.  Slower heart rate.  Lower blood pressure.  Clearer skin.  The ability to breathe more easily.  Fewer sick days. Quitting smoking is very challenging for most people. Do not get discouraged if you are not successful the first time. Some people need to make many attempts to quit before they achieve long-term success. Do your best to stick to your quit plan, and talk with your health care provider if you have any questions or  concerns. This information is not intended to replace advice given to you by your health care provider. Make sure you discuss any questions you have with your health care provider. Document Released: 06/29/2001 Document Revised: 02/08/2017 Document Reviewed: 11/19/2014 Elsevier Interactive Patient Education  2019 [employer].

## 2018-08-25 NOTE — Progress Notes (Signed)
Subjective:    Patient ID: [name], male    DOB: June 14, 1951, 68 y.o.   MRN: [id number]  No chief complaint on file. Pt accompanied by his girlfriend.  HPI   Pt is a 68 yo male with pmh sig for HTN, tobacco use, hiatal hernia, GERD, esophagitis, Hep C, acute on chronic anemia, chronic pain.  Patient was seen today for HFU.  Pt admitted 1/2-07/26/18 for ICH 2/2 hypertension emergency.  Pt had R sided facial droop, slurred speech, agitation, R sided weakness.  EMS reported syst bp in 200s.  Non contrast CT with bleed in L lateral superior thalamus extending into periventricular/posterior lentiform nucleus region with mild edema and no midline shift.  CT head and neck with Athlersclerotic disease in both carotid bifurcations.  Ambien was d/c'd  Pt was in rehab form 1/8-1/31/2020 for PT, ST, OT.  Tramadol was give to pt for chronic back and shoulder pain.  Pt also received steroid injection for L shoulder pain.  In rehab pt was given Seroquel, trazadone, and Ambien for sleep.  He was also given xanax .25 mg.  H/H was 6.9/24.1 for which he was given 2 u pRBCs and feraheme infusion x2.  Since d/c home pt states he is improving.  Pt states H/H PT, OT, and ST have been coming by the house.  Pt states he has some edema in his LEs.  Pt is sitting for extended periods of time.  Pt does not do much walking on his own out side of therapy.  Pt states he is still smoking cigarettes and MJ.  Pt states he has no plans to stop.  Pt eating fried foods, pork chops, chitterlings, etc as he grew up eating these things.  Pt's gf states she threw out the salt but pt states he will just go buy more.  Past Medical History:  Diagnosis Date  . Arthritis   . Cameron ulcer, chronic 01/11/2018  . Chronic back pain   . GERD (gastroesophageal reflux disease)    uses Baking Soda  . GIB (gastrointestinal bleeding) 2012  . Glaucoma    right eye  . Headache    occasionally  . Hepatitis C   . Hiatal hernia with  gastroesophageal reflux disease and esophagitis 01/05/2018  . History of blood transfusion    no abnormal reaction noted  . History of gout    doesn't take any meds  . Hyperlipidemia    not on any meds  . Hypertension    takes Amlodipine and Lisinopril daily  . Insomnia    takes Ambien nightly  . Ischemic colitis ([place]) 2012  . Joint pain   . Nocturia   . Numbness    both legs occasionally  . PONV (postoperative nausea and vomiting)   . Prostate cancer ([place])   . Shortness of breath dyspnea    rarely but when notices he can be lying/sitting/exertion.Dr.Hochrein is aware per pt  . Stroke ([place]) 2016   . Urinary frequency   . Urinary urgency     Allergies  Allergen Reactions  . Penicillins Anaphylaxis    Has patient had a PCN reaction causing immediate rash, facial/tongue/throat swelling, SOB or lightheadedness with hypotension: Yes Has patient had a PCN reaction causing severe rash involving mucus membranes or skin necrosis: No Has patient had a PCN reaction that required hospitalization: No Has patient had a PCN reaction occurring within the last 10 years: No If all of the above answers are "NO", then may proceed with  Cephalosporin use..  . Sudafed [Pseudoephedrine] Other (See Comments)    Heart "races"    ROS General: Denies fever, chills, night sweats, changes in weight, changes in appetite HEENT: Denies headaches, ear pain, changes in vision, rhinorrhea, sore throat CV: Denies CP, palpitations, SOB, orthopnea Pulm: Denies SOB, cough, wheezing GI: Denies abdominal pain, nausea, vomiting, diarrhea, constipation GU: Denies dysuria, hematuria, frequency, vaginal discharge Msk: Denies muscle cramps  +joint pain Neuro: Denies weakness, numbness, tingling  +weakness. Skin: Denies rashes, bruising Psych: Denies depression, anxiety, hallucinations     Objective:    Blood pressure 120/62, pulse 62, temperature 98.2 F (36.8 C), temperature source Oral, weight 211 lb (95.7  kg), SpO2 97 %.  Gen. Pleasant, well-nourished, in no distress, normal affect   HEENT: [place]/AT, face symmetric, conjunctiva clear, no scleral icterus, PERRLA, EOMI, nares patent without drainage Neck: No JVD, no carotid bruits Lungs: no accessory muscle use, CTAB, no wheezes or rales Cardiovascular: RRR, no m/r/g, 1+ edema in feet/ankles Musculoskeletal: Wearing brace on RLE.  No deformities, no cyanosis or clubbing, normal tone.  Strength in RLE 4/5 LLE 3/5, grip strength in R 4/5 and L 3/5.    Neuro:  A&Ox3, CN II-XII intact, gait not assessed as sitting in wheelchair Skin:  Warm, no lesions/ rash  Wt Readings from Last 3 Encounters:  08/16/18 203 lb 8.3 oz (92.3 kg)  07/24/18 196 lb 10.4 oz (89.2 kg)  06/24/18 198 lb (89.8 kg)    Lab Results  Component Value Date   WBC 5.7 08/14/2018   HGB 8.5 (L) 08/14/2018   HCT 29.3 (L) 08/14/2018   PLT 417 (H) 08/14/2018   GLUCOSE 100 (H) 08/14/2018   CHOL 102 07/22/2018   TRIG 108 07/22/2018   HDL 35 (L) 07/22/2018   LDLCALC 45 07/22/2018   ALT 19 07/27/2018   AST 17 07/27/2018   NA 138 08/14/2018   K 3.9 08/14/2018   CL 109 08/14/2018   CREATININE 1.04 08/14/2018   BUN 18 08/14/2018   CO2 19 (L) 08/14/2018   TSH 1.510 01/11/2018   INR 1.23 07/20/2018   HGBA1C 4.7 (L) 07/22/2018    Assessment/Plan:  Acute CVA (cerebrovascular accident) ([place]) -Improving.  Right side appears to be becoming stronger than left. -Continue to avoid aspirin and NSAIDs given risk of bleeding -Discussed the importance of diet modifications compliance with medications and blood pressure control -Continue HH PT, OT, ST -Encouraged to keep follow-up with PM&R and [facility] stroke clinic  Tobacco abuse -Patient continues to smoke.  Is not interested in quitting at this time. -Smoking cessation counseling greater than 3 minutes, less than 10 minutes -Patient encouraged to continue Wellbutrin 75 mg twice daily to aid with smoking  cessation -We will reassess at each visit  Essential hypertension -controlled -Patient advised in the future when almost out of meds to contact the office or pharmacy. -continue current meds: norvasc 10 mg, lisinopril 40 mg, hydrochlorothiazide 12.5 mg. -The importance of decreasing sodium intake discussed.  Patient refuses to decrease sodium/cut down on fried foods.  Hospital discharge follow-up -Telephone call reviewed -Notes from hospitalization and rehab reviewed  Other chronic pain  -Pt requesting refill on tramadol as given to him in rehab. -In the past patient was referred to pain management -Pt advised in order to receive pain medications with this provider he would have to avoid using other substances.  Pt signed a pain management agreement agreeing to this. -Of note this provider will not be refilling Xanax  as prescribed during rehab nor Ambien 5 mg. - Plan: traMADol (ULTRAM) 50 MG tablet, Pain Mgmt, Profile 8 w/Conf, U   F/u prn in the next few months.  Encouraged to keep f/u appointments with PM&R, GI, and Neurology  [name], MD

## 2018-08-27 LAB — PAIN MGMT, PROFILE 8 W/CONF, U
6 Acetylmorphine: NEGATIVE ng/mL (ref <10)
Alcohol Metabolites: NEGATIVE ng/mL (ref <500)
Amphetamines: NEGATIVE ng/mL (ref <500)
Benzodiazepines: NEGATIVE ng/mL (ref <100)
Buprenorphine, Urine: NEGATIVE ng/mL (ref <5)
Cocaine Metabolite: NEGATIVE ng/mL (ref <150)
Creatinine: 104.4 mg/dL
MDMA: NEGATIVE ng/mL (ref <500)
Marijuana Metabolite: 252 ng/mL — ABNORMAL HIGH (ref <5)
Marijuana Metabolite: POSITIVE ng/mL — AB (ref <20)
Opiates: NEGATIVE ng/mL (ref <100)
Oxidant: NEGATIVE ug/mL (ref <200)
Oxycodone: NEGATIVE ng/mL (ref <100)
pH: 8 (ref 4.5–9.0)

## 2018-08-28 ENCOUNTER — Encounter: Payer: Self-pay | Admitting: Physical Medicine & Rehabilitation

## 2018-08-28 ENCOUNTER — Encounter: Payer: Medicare Other | Attending: Physical Medicine & Rehabilitation | Admitting: Physical Medicine & Rehabilitation

## 2018-08-28 ENCOUNTER — Ambulatory Visit: Payer: Self-pay

## 2018-08-28 VITALS — BP 142/77 | HR 61 | Resp 14 | Ht 69.5 in | Wt 211.0 lb

## 2018-08-28 DIAGNOSIS — Z8546 Personal history of malignant neoplasm of prostate: Secondary | ICD-10-CM | POA: Diagnosis not present

## 2018-08-28 DIAGNOSIS — I10 Essential (primary) hypertension: Secondary | ICD-10-CM | POA: Insufficient documentation

## 2018-08-28 DIAGNOSIS — G8929 Other chronic pain: Secondary | ICD-10-CM | POA: Diagnosis not present

## 2018-08-28 DIAGNOSIS — M1990 Unspecified osteoarthritis, unspecified site: Secondary | ICD-10-CM | POA: Insufficient documentation

## 2018-08-28 DIAGNOSIS — Z8249 Family history of ischemic heart disease and other diseases of the circulatory system: Secondary | ICD-10-CM | POA: Insufficient documentation

## 2018-08-28 DIAGNOSIS — R531 Weakness: Secondary | ICD-10-CM | POA: Diagnosis not present

## 2018-08-28 DIAGNOSIS — M549 Dorsalgia, unspecified: Secondary | ICD-10-CM | POA: Diagnosis not present

## 2018-08-28 DIAGNOSIS — M25561 Pain in right knee: Secondary | ICD-10-CM | POA: Insufficient documentation

## 2018-08-28 DIAGNOSIS — M109 Gout, unspecified: Secondary | ICD-10-CM | POA: Insufficient documentation

## 2018-08-28 DIAGNOSIS — R4189 Other symptoms and signs involving cognitive functions and awareness: Secondary | ICD-10-CM | POA: Insufficient documentation

## 2018-08-28 DIAGNOSIS — R262 Difficulty in walking, not elsewhere classified: Secondary | ICD-10-CM | POA: Diagnosis not present

## 2018-08-28 DIAGNOSIS — K219 Gastro-esophageal reflux disease without esophagitis: Secondary | ICD-10-CM | POA: Insufficient documentation

## 2018-08-28 DIAGNOSIS — I619 Nontraumatic intracerebral hemorrhage, unspecified: Secondary | ICD-10-CM | POA: Diagnosis not present

## 2018-08-28 DIAGNOSIS — G4700 Insomnia, unspecified: Secondary | ICD-10-CM | POA: Diagnosis not present

## 2018-08-28 DIAGNOSIS — E785 Hyperlipidemia, unspecified: Secondary | ICD-10-CM | POA: Diagnosis not present

## 2018-08-28 DIAGNOSIS — K257 Chronic gastric ulcer without hemorrhage or perforation: Secondary | ICD-10-CM

## 2018-08-28 DIAGNOSIS — F5101 Primary insomnia: Secondary | ICD-10-CM

## 2018-08-28 DIAGNOSIS — I69251 Hemiplegia and hemiparesis following other nontraumatic intracranial hemorrhage affecting right dominant side: Secondary | ICD-10-CM | POA: Insufficient documentation

## 2018-08-28 DIAGNOSIS — D62 Acute posthemorrhagic anemia: Secondary | ICD-10-CM | POA: Diagnosis not present

## 2018-08-28 DIAGNOSIS — R51 Headache: Secondary | ICD-10-CM | POA: Diagnosis not present

## 2018-08-28 DIAGNOSIS — Z720 Tobacco use: Secondary | ICD-10-CM

## 2018-08-28 DIAGNOSIS — B1920 Unspecified viral hepatitis C without hepatic coma: Secondary | ICD-10-CM | POA: Diagnosis not present

## 2018-08-28 DIAGNOSIS — H409 Unspecified glaucoma: Secondary | ICD-10-CM | POA: Insufficient documentation

## 2018-08-28 DIAGNOSIS — F17210 Nicotine dependence, cigarettes, uncomplicated: Secondary | ICD-10-CM | POA: Insufficient documentation

## 2018-08-28 MED ORDER — QUETIAPINE FUMARATE 50 MG PO TABS: 50.0000 mg | ORAL_TABLET | Freq: Every day | ORAL | 2 refills | Status: DC

## 2018-08-28 MED ORDER — BUPROPION HCL 100 MG PO TABS: 100.0000 mg | ORAL_TABLET | Freq: Two times a day (BID) | ORAL | 4 refills | Status: DC

## 2018-08-28 MED ORDER — AMLODIPINE BESYLATE 10 MG PO TABS: 10.0000 mg | ORAL_TABLET | Freq: Every day | ORAL | 2 refills | Status: DC

## 2018-08-28 MED ORDER — ZOLPIDEM TARTRATE 5 MG PO TABS: 5.0000 mg | ORAL_TABLET | Freq: Every day | ORAL | 2 refills | Status: DC

## 2018-08-28 MED ORDER — HYDRALAZINE HCL 10 MG PO TABS: 10.0000 mg | ORAL_TABLET | Freq: Every day | ORAL | 2 refills | Status: DC

## 2018-08-28 MED ORDER — LISINOPRIL 40 MG PO TABS: 40.0000 mg | ORAL_TABLET | Freq: Every day | ORAL | 2 refills | Status: DC

## 2018-08-28 MED ORDER — CYCLOBENZAPRINE HCL 5 MG PO TABS: 5.0000 mg | ORAL_TABLET | Freq: Two times a day (BID) | ORAL | 2 refills | Status: DC | PRN

## 2018-08-28 NOTE — Patient Instructions (Signed)
KEEP WORKING ON YOUR LEFT SHOULDER RANGE OF MOTION  I WILL ORDER AN MRI OF YOUR SHOULDER THIS [place].

## 2018-08-28 NOTE — Telephone Encounter (Signed)
[facility] called to report a possible developing wound to the inner buttock both sides. Ebony Hail who is a speech therapist states that the wound is not open.  She describes it as white dry skin area inner cheeks right and left buttock.  She states that the family placed barrier cream and Ebony Hail recommenced position changes.  She states the patient is also followed by PT and  OT as well.  Called Dustin at office. Will route message to Dr Volanda Napoleon for further recommendations.   Reason for Disposition . [1] Caller requesting NON-URGENT health information AND [2] PCP's office is the best resource  Answer Assessment - Initial Assessment Questions 1. REASON FOR CALL or QUESTION: "What is your reason for calling today?" or "How can I best help you?" or "What question do you have that I can help answer?"     [facility] calling to say they identified possible woud development to pt inner buttock both sides.  Protocols used: INFORMATION ONLY CALL-A-AH

## 2018-08-28 NOTE — Progress Notes (Signed)
Subjective:    Patient ID: [name], male    DOB: [birth year], 68 y.o.   MRN: [id number]  HPI This is a transitional care visit for [name] who is following up after a left thalamic hemorrhage in his inpatient rehab stay.  Home health is coming out the house. He has been working with PT, OT, and SLP. He has walked about 49' with a rolling walker  He fell once when he got up on his own.   Left shoulder continues to bother him but no worse than while he was in the hospital.  He has some generalized back pain and pain in the right knee as well.   His sleep is better as a whole with the medication regimen we prescribed.   He tells me he still is smoking, "2 cigarettes/day".  He states that he still is smoking because he cannot just stop and that he was not given anything to help.  I reminded him that we started Wellbutrin while he was in the hospital.  His significant other pulled me aside after the visit today and told me that he is doing "nothing at home" except for when therapy comes to visit.  I asked her if she felt he was depressed and she more felt it was due to him being "stubborn".   Pain Inventory Average Pain 9 Pain Right Now 8 My pain is dull and aching  In the last 24 hours, has pain interfered with the following? General activity 5 Relation with others 0 Enjoyment of life 8 What TIME of day is your pain at its worst? all Sleep (in general) Fair  Pain is worse with: walking, bending, sitting, inactivity, standing and some activites Pain improves with: rest and medication Relief from Meds: 2  Mobility walk with assistance use a walker ability to climb steps?  no do you drive?  no  Function retired  Neuro/Psych bladder control problems tremor trouble walking  Prior Studies transitional care  Physicians involved in your care transitional care   Family History  Problem Relation Age of Onset  . Alzheimer's disease Father   . Cancer Mother         Lymph node  . Heart failure Brother 45       Transplant 13 years ago  . CAD Brother   . Heart disease Sister 78       MI   Social History   Socioeconomic History  . Marital status: Legally Separated    Spouse name: Not on file  . Number of children: 2  . Years of education: Not on file  . Highest education level: Not on file  Occupational History  . Not on file  Social Needs  . Financial resource strain: Not on file  . Food insecurity:    Worry: Not on file    Inability: Not on file  . Transportation needs:    Medical: Not on file    Non-medical: Not on file  Tobacco Use  . Smoking status: Current Every Day Smoker    Packs/day: 0.50    Years: 50.00    Pack years: 25.00    Types: Cigarettes  . Smokeless tobacco: Never Used  Substance and Sexual Activity  . Alcohol use: Yes    Alcohol/week: 0.0 standard drinks    Comment: occasionally  . Drug use: No    Frequency: 5.0 times per week  . Sexual activity: Yes  Lifestyle  . Physical activity:    Days  per week: Not on file    Minutes per session: Not on file  . Stress: Not on file  Relationships  . Social connections:    Talks on phone: Not on file    Gets together: Not on file    Attends religious service: Not on file    Active member of club or organization: Not on file    Attends meetings of clubs or organizations: Not on file    Relationship status: Not on file  Other Topics Concern  . Not on file  Social History Narrative   One living child .  One murdered.  Lives with girlfriend.     Past Surgical History:  Procedure Laterality Date  . APPENDECTOMY    . BIOPSY  01/11/2018   Procedure: BIOPSY;  Surgeon: Gatha Mayer, MD;  Location: [facility] ENDOSCOPY;  Service: Endoscopy;;  . COLONOSCOPY    . COLONOSCOPY N/A 10/26/2015   Procedure: COLONOSCOPY;  Surgeon: Doran Stabler, MD;  Location: [facility] ENDOSCOPY;  Service: Endoscopy;  Laterality: N/A;  . ESOPHAGOGASTRODUODENOSCOPY    . ESOPHAGOGASTRODUODENOSCOPY  N/A 10/26/2015   Procedure: ESOPHAGOGASTRODUODENOSCOPY (EGD);  Surgeon: Doran Stabler, MD;  Location: [facility] ENDOSCOPY;  Service: Endoscopy;  Laterality: N/A;  . ESOPHAGOGASTRODUODENOSCOPY (EGD) WITH PROPOFOL N/A 01/11/2018   Procedure: ESOPHAGOGASTRODUODENOSCOPY (EGD) WITH PROPOFOL;  Surgeon: Gatha Mayer, MD;  Location: [facility] ENDOSCOPY;  Service: Endoscopy;  Laterality: N/A;  . INGUINAL HERNIA REPAIR Right 11/04/2014   Procedure: RIGHT INGUINAL HERNIA REPAIR WITH MESH;  Surgeon: Jackolyn Confer, MD;  Location: [place];  Service: General;  Laterality: Right;  . MASS EXCISION N/A 11/04/2014   Procedure: REMOVAL OF RIGHT GROIN SOFT TISSUE MASS;  Surgeon: Jackolyn Confer, MD;  Location: [place];  Service: General;  Laterality: N/A;  . Multiple abdominal surgeries     total of 13  . PROSTATECTOMY    . SMALL INTESTINE SURGERY     Past Medical History:  Diagnosis Date  . Arthritis   . Cameron ulcer, chronic 01/11/2018  . Chronic back pain   . GERD (gastroesophageal reflux disease)    uses Baking Soda  . GIB (gastrointestinal bleeding) 2012  . Glaucoma    right eye  . Headache    occasionally  . Hepatitis C   . Hiatal hernia with gastroesophageal reflux disease and esophagitis 01/05/2018  . History of blood transfusion    no abnormal reaction noted  . History of gout    doesn't take any meds  . Hyperlipidemia    not on any meds  . Hypertension    takes Amlodipine and Lisinopril daily  . Insomnia    takes Ambien nightly  . Ischemic colitis ([place]) 2012  . Joint pain   . Nocturia   . Numbness    both legs occasionally  . PONV (postoperative nausea and vomiting)   . Prostate cancer ([place])   . Shortness of breath dyspnea    rarely but when notices he can be lying/sitting/exertion.Dr.Hochrein is aware per pt  . Stroke ([place]) 2016   . Urinary frequency   . Urinary urgency    BP (!) 142/77   Pulse 61   Resp 14   Ht 5' 9.5" (1.765 m)   Wt 211 lb (95.7 kg) Comment: previous  SpO2 96%   BMI  30.71 kg/m   Opioid Risk Score:   Fall Risk Score:  `1  Depression screen [facility] 2/9  Depression screen [facility] 2/9 10/18/2017 08/29/2017 07/11/2017 06/23/2017 05/27/2016 12/08/2015 12/05/2015  Decreased Interest 0 1 0 0 0 0 3  Down, Depressed, Hopeless 0 1 0 0 0 0 1  PHQ - 2 Score 0 2 0 0 0 0 4  Altered sleeping - 3 - - - 0 0  Tired, decreased energy - 2 - - - 0 1  Change in appetite - 3 - - - 0 0  Feeling bad or failure about yourself  - 1 - - - 0 0  Trouble concentrating - 3 - - - 0 0  Moving slowly or fidgety/restless - 3 - - - 0 0  Suicidal thoughts - 0 - - - 0 0  PHQ-9 Score - 17 - - - 0 5  Difficult doing work/chores - - - - - Not difficult at all Somewhat difficult    Review of Systems  Constitutional: Negative.   HENT: Negative.   Eyes: Negative.   Respiratory: Negative.   Cardiovascular: Negative.   Gastrointestinal: Negative.   Endocrine: Negative.   Genitourinary: Positive for difficulty urinating.  Musculoskeletal: Positive for gait problem.  Skin: Positive for rash.  Allergic/Immunologic: Negative.   Neurological: Positive for tremors.  Psychiatric/Behavioral: Negative.   All other systems reviewed and are negative.      Objective:   Physical Exam General: No acute distress.  Smells of cigarette smoke HEENT: EOMI, oral membranes moist Cards: reg rate  Chest: normal effort Abdomen: Soft, NT, ND Skin: dry, intact Extremities: no edema   Musc:Left shoulder tender to AB and adduction as well as external and internal rotation.  Is easily able to abduct him to 90 degrees, I did seem to feel some abolished at all resistance when I lifted the arm. Neurological:  dysarthric.  Otherwise cranial nerve exam grossly intact Follows basic commands. Fair insight. STM deficits ongoing Motor:  LUE: 3+ to 4-/5 prox to 4-/5 distally  LLE: HF, KE 4/5, ADF 4/5 APF 4/5.  I did not ambulate him as he was in his wheelchair and had no cane or walker with him.  Seems to sense touch on  the right arm and leg but perhaps with decreased proprioception Skin: Skin is warm and dry.   Psychiatric: Fairly pleasant but occasionally irritable       Assessment & Plan:  1. 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             --continue with HH therapies.  Could consider transition to outpatient at some point.  -Tried to politely remind the patient that he has to work to reach the goals he seeks 2.  History of chronic back pain/headaches/pain Management: Tylenol as needed for now             -Patient status post left shoulder injection/subacromial. Some improvement in  Pain but ROM still limited.              -cont tramadol as previously written.  Primary is filling this medication now  -Refill Flexeril today. 4. Mood: Wellbutrin 5.  6 HTN:  Continue amlodipine and lisinopril--as well as hydralazine.  Blood pressure fairly well controlled at present  9. Acute on chronic blood loss anemia/HH with gastritis:              needs folllow up hgb over next couple weeks with primary  -contiue Fe++ supp    -Hemoglobin was trending up at the time of discharge            10. Chronic Insomnia seems  fairly regulated with Seroquel and Ambien.  Continue current regimen 11. Tobacco dependence:              -Reiterated the importance of tobacco cessation especially given the context of his recent thalamic hemorrhage             -Continue Wellbutrin but increase to 100 mg twice daily   30 minutes was spent in face-to-face contact with patient and family today in the office.  I will see the patient back in about 2 months time.  Spoke with significant other regarding his willingness to work and stay active at home.  He has notions of how he is going to do things unfortunately.  Sometimes this mindset is a barrier to him making progress.

## 2018-08-30 ENCOUNTER — Telehealth: Payer: Self-pay

## 2018-08-30 DIAGNOSIS — M1990 Unspecified osteoarthritis, unspecified site: Secondary | ICD-10-CM

## 2018-08-30 DIAGNOSIS — B1920 Unspecified viral hepatitis C without hepatic coma: Secondary | ICD-10-CM

## 2018-08-30 DIAGNOSIS — F1020 Alcohol dependence, uncomplicated: Secondary | ICD-10-CM

## 2018-08-30 DIAGNOSIS — I69111 Memory deficit following nontraumatic intracerebral hemorrhage: Secondary | ICD-10-CM

## 2018-08-30 DIAGNOSIS — F5104 Psychophysiologic insomnia: Secondary | ICD-10-CM

## 2018-08-30 DIAGNOSIS — K210 Gastro-esophageal reflux disease with esophagitis: Secondary | ICD-10-CM

## 2018-08-30 DIAGNOSIS — F17210 Nicotine dependence, cigarettes, uncomplicated: Secondary | ICD-10-CM

## 2018-08-30 DIAGNOSIS — M549 Dorsalgia, unspecified: Secondary | ICD-10-CM

## 2018-08-30 DIAGNOSIS — I10 Essential (primary) hypertension: Secondary | ICD-10-CM

## 2018-08-30 DIAGNOSIS — R471 Dysarthria and anarthria: Secondary | ICD-10-CM

## 2018-08-30 DIAGNOSIS — D62 Acute posthemorrhagic anemia: Secondary | ICD-10-CM

## 2018-08-30 DIAGNOSIS — Z8546 Personal history of malignant neoplasm of prostate: Secondary | ICD-10-CM

## 2018-08-30 DIAGNOSIS — I69354 Hemiplegia and hemiparesis following cerebral infarction affecting left non-dominant side: Secondary | ICD-10-CM | POA: Diagnosis not present

## 2018-08-30 DIAGNOSIS — G8929 Other chronic pain: Secondary | ICD-10-CM

## 2018-08-30 DIAGNOSIS — I69151 Hemiplegia and hemiparesis following nontraumatic intracerebral hemorrhage affecting right dominant side: Secondary | ICD-10-CM | POA: Diagnosis not present

## 2018-08-30 DIAGNOSIS — D500 Iron deficiency anemia secondary to blood loss (chronic): Secondary | ICD-10-CM

## 2018-08-30 DIAGNOSIS — R51 Headache: Secondary | ICD-10-CM

## 2018-08-30 DIAGNOSIS — E785 Hyperlipidemia, unspecified: Secondary | ICD-10-CM

## 2018-08-30 DIAGNOSIS — H409 Unspecified glaucoma: Secondary | ICD-10-CM

## 2018-08-30 NOTE — Telephone Encounter (Signed)
Ebony Hail, ST from [facility] called requesting verbal orders for continue 2wk2 for HHST. Orders given.

## 2018-09-01 ENCOUNTER — Other Ambulatory Visit: Payer: Self-pay | Admitting: Family Medicine

## 2018-09-01 DIAGNOSIS — G8929 Other chronic pain: Secondary | ICD-10-CM

## 2018-09-01 NOTE — Telephone Encounter (Signed)
Copied from [place] [id number]. Topic: Quick Communication - Rx Refill/Question >> Sep 01, 2018  9:38 AM Keene Breath wrote: Medication: traMADol (ULTRAM) 50 MG tablet  Patient called to request a refill for the above medication, Patient also stated that he is currently out of the medication  Preferred Pharmacy (with phone number or street name): [place], [place] [address] [phone] (Phone) [phone] (Fax)

## 2018-09-01 NOTE — Telephone Encounter (Signed)
Requested medication (s) are due for refill today: no  Requested medication (s) are on the active medication list: yes  Last refill:  08/25/18 #30  Future visit scheduled: no  Notes to clinic:  Medication not delegated to NT to refill   Requested Prescriptions  Pending Prescriptions Disp Refills   traMADol (ULTRAM) 50 MG tablet 30 tablet 0    Sig: Take 1 tablet (50 mg total) by mouth every 12 (twelve) hours as needed for severe pain (shouder pain).     Not Delegated - Analgesics:  Opioid Agonists Failed - 09/01/2018  9:45 AM      Failed - This refill cannot be delegated      Passed - Urine Drug Screen completed in last 360 days.      Passed - Valid encounter within last 6 months    Recent Outpatient Visits          1 week ago Acute CVA (cerebrovascular accident) ([place])   [facility] [place], MD   5 months ago Other chronic pain   Therapist, music at [facility], Langley Adie, MD   7 months ago Hospital discharge follow-up   [facility] [place], MD   7 months ago Insomnia, unspecified type   Therapist, music at [place], MD   9 months ago Chronic pain syndrome   [facility], Ranell Patrick, MD

## 2018-09-01 NOTE — Telephone Encounter (Signed)
Please advise 

## 2018-09-01 NOTE — Telephone Encounter (Signed)
Ok for [facility] to do wound care prn,  Ok to use barrier cream and change postitions.

## 2018-09-01 NOTE — Telephone Encounter (Signed)
Called left a detailed message  [name] with [facility] regarding pt approved orders for [facility] to do wound care prn and to use barrier cream/change positions

## 2018-09-12 ENCOUNTER — Telehealth: Payer: Self-pay | Admitting: *Deleted

## 2018-09-12 MED ORDER — TRAMADOL HCL 50 MG PO TABS: 50.0000 mg | ORAL_TABLET | Freq: Two times a day (BID) | ORAL | 0 refills | Status: DC | PRN

## 2018-09-12 NOTE — Telephone Encounter (Signed)
Mber, PT, [facility] left a message asking for verbal orders to extend HHPT 2week2 followed by 1week 3.  Medical record reviewed. Social work note reviewed.  Verbal orders given per office protocol.

## 2018-09-13 ENCOUNTER — Telehealth: Payer: Self-pay | Admitting: *Deleted

## 2018-09-13 NOTE — Telephone Encounter (Signed)
[name] called for a refill on medications.  Says he is 'bout outta all of them".  I directed him to call his PCP to refill medications per Dr Naaman Plummer note.

## 2018-09-14 ENCOUNTER — Other Ambulatory Visit: Payer: Self-pay | Admitting: Family Medicine

## 2018-09-14 NOTE — Telephone Encounter (Signed)
Pt called and notified that the requested medications with the exception of Alprazolam 0.25 mg were available at [facility].  Pt advised that he would need to call pharmacy tor request medications. Understanding verbalized. Pt states Alprazolam was prescribed for him while he was in the hospital. Pt requesting a refill of Alprazolam to be sent to [facility].

## 2018-09-14 NOTE — Telephone Encounter (Signed)
Copied from [place] [id number]. Topic: Quick Communication - Rx Refill/Question >> Sep 14, 2018 10:55 AM Percell Belt A wrote: Medication:  hydrALAZINE (APRESOLINE) 10 MG tablet [[id number]] buPROPion (WELLBUTRIN) 100 MG tablet [[id number]] zolpidem (AMBIEN) 5 MG tablet [[id number]]  traMADol (ULTRAM) 50 MG tablet [[id number]]  QUEtiapine (SEROQUEL) 50 MG tablet [[id number] amLODipine (NORVASC) 10 MG tablet [[id number]]  lisinopril (PRINIVIL,ZESTRIL) 40 MG tablet [[id number] ALPRAZolam (XANAX) 0.25 MG tablet [[id number]]  Has the patient contacted their pharmacy? No. (Agent: If no, request that the patient contact the pharmacy for the refill.) (Agent: If yes, when and what did the pharmacy advise?)   Preferred Pharmacy (with phone number or street name): [place], [place] [phone] (Phone)  Agent: Please be advised that RX refills may take up to 3 business days. We ask that you follow-up with your pharmacy.

## 2018-09-14 NOTE — Telephone Encounter (Signed)
Please advise 

## 2018-09-15 ENCOUNTER — Other Ambulatory Visit (HOSPITAL_COMMUNITY): Payer: Self-pay | Admitting: Physical Medicine and Rehabilitation

## 2018-09-20 ENCOUNTER — Telehealth: Payer: Self-pay | Admitting: *Deleted

## 2018-09-20 NOTE — Telephone Encounter (Signed)
Henderson Newcomer, OT, [place] left a message asking for verbal orders for [facility] 2week2 followed by [facility].  Medical record reviewed. Social work note reviewed.  Verbal orders given per office protocol.

## 2018-09-21 ENCOUNTER — Other Ambulatory Visit (HOSPITAL_COMMUNITY): Payer: Self-pay | Admitting: Family Medicine

## 2018-10-02 ENCOUNTER — Ambulatory Visit: Payer: Self-pay

## 2018-10-02 NOTE — Telephone Encounter (Signed)
Patient had also called the after hours line and was advised to go to ED as well.  RN supervisor attempted to reach out to patient. Patient did not answer. Left a voicemail for patient to return call.

## 2018-10-02 NOTE — Telephone Encounter (Signed)
Incoming call from Patient who complains of numbness of mouth.  Can't taste anything Can smell.  Onset was 2 to 3 days ago.  Pattern is constant.  Denies cardiac issues.  Denies Neurologic Symptoms.  Reviewed Protocol with Patient .  Recommended that Patient go to ED, or Urgent Care.  Patient became upset.  Wished to see his PCP.    Explained that he could be evaluated better @ ED.  Patient states he was just in hospital.  Mickey Farber sure if Patient will go.    Reason for Disposition . [1] Numbness (i.e., loss of sensation) of the face, arm / hand, or leg / foot on one side of the body AND [2] sudden onset AND [3] present now  Answer Assessment - Initial Assessment Questions 1. SYMPTOM: "What is the main symptom you are concerned about?" (e.g., weakness, numbness)     Mouth is numb, cant taste anything. 2. ONSET: "When did this start?" (minutes, hours, days; while sleeping)     2 to 3 days ago 3. LAST NORMAL: "When was the last time you were normal (no symptoms)?"      4. PATTERN "Does this come and go, or has it been constant since it started?"  "Is it present now?"     constant 5. CARDIAC SYMPTOMS: "Have you had any of the following symptoms: chest pain, difficulty breathing, palpitations?"     Denies.6. NEUROLOGIC SYMPTOMS: "Have you had any of the following symptoms: headache, dizziness, vision loss, double vision, changes in speech, unsteady on your feet?"    deniesOTHER SYMPTOMS: "Do you have any other symptoms?"     denies 8. PREGNANCY: "Is there any chance you are pregnant?" "When was your last menstrual period?"     Na  Protocols used: NEUROLOGIC DEFICIT-A-AH

## 2018-10-02 NOTE — Telephone Encounter (Signed)
FYI

## 2018-10-04 ENCOUNTER — Telehealth: Payer: Self-pay

## 2018-10-04 DIAGNOSIS — I619 Nontraumatic intracerebral hemorrhage, unspecified: Secondary | ICD-10-CM

## 2018-10-04 NOTE — Telephone Encounter (Signed)
Referrals made to [facility] outpatient neuro rehab for PT, OT, speech therapy

## 2018-10-04 NOTE — Telephone Encounter (Signed)
Amber, PT/[facility] called stating patient is ready for transition to outpatient.

## 2018-10-16 ENCOUNTER — Other Ambulatory Visit: Payer: Self-pay | Admitting: Family Medicine

## 2018-10-18 ENCOUNTER — Other Ambulatory Visit: Payer: Self-pay

## 2018-10-18 ENCOUNTER — Ambulatory Visit: Payer: Medicare Other | Admitting: Occupational Therapy

## 2018-10-18 ENCOUNTER — Encounter: Payer: Self-pay | Admitting: Physical Therapy

## 2018-10-18 ENCOUNTER — Ambulatory Visit: Payer: Medicare Other

## 2018-10-18 ENCOUNTER — Ambulatory Visit: Payer: Medicare Other | Attending: Physical Medicine & Rehabilitation | Admitting: Physical Therapy

## 2018-10-18 VITALS — BP 172/79 | HR 72

## 2018-10-18 DIAGNOSIS — M25612 Stiffness of left shoulder, not elsewhere classified: Secondary | ICD-10-CM | POA: Insufficient documentation

## 2018-10-18 DIAGNOSIS — I69318 Other symptoms and signs involving cognitive functions following cerebral infarction: Secondary | ICD-10-CM | POA: Diagnosis present

## 2018-10-18 DIAGNOSIS — R482 Apraxia: Secondary | ICD-10-CM

## 2018-10-18 DIAGNOSIS — R2681 Unsteadiness on feet: Secondary | ICD-10-CM | POA: Insufficient documentation

## 2018-10-18 DIAGNOSIS — R2689 Other abnormalities of gait and mobility: Secondary | ICD-10-CM | POA: Diagnosis present

## 2018-10-18 DIAGNOSIS — I69351 Hemiplegia and hemiparesis following cerebral infarction affecting right dominant side: Secondary | ICD-10-CM

## 2018-10-18 DIAGNOSIS — R41841 Cognitive communication deficit: Secondary | ICD-10-CM

## 2018-10-18 DIAGNOSIS — R278 Other lack of coordination: Secondary | ICD-10-CM

## 2018-10-18 DIAGNOSIS — M25512 Pain in left shoulder: Secondary | ICD-10-CM | POA: Insufficient documentation

## 2018-10-18 DIAGNOSIS — R471 Dysarthria and anarthria: Secondary | ICD-10-CM | POA: Insufficient documentation

## 2018-10-18 NOTE — Therapy (Signed)
[facility] [address] [place], [place], [address] Phone: [phone]   Fax:  [phone]  Physical Therapy Evaluation  Patient Details  Name: [name] MRN: [id number] Date of Birth: May 18, 1951 Referring Provider (PT): Dr. Alger Simons    Encounter Date: 10/18/2018  PT End of Session - 10/18/18 1221    Visit Number  1    Number of Visits  13    Date for PT Re-Evaluation  01/16/19    Authorization Type  [employer] Medicare: 10th visit PN    PT Start Time  1108    PT Stop Time  1150    PT Time Calculation (min)  42 min    Equipment Utilized During Treatment  Gait belt    Activity Tolerance  Patient tolerated treatment well    Behavior During Therapy  WFL for tasks assessed/performed       Past Medical History:  Diagnosis Date  . Arthritis   . Cameron ulcer, chronic 01/11/2018  . Chronic back pain   . GERD (gastroesophageal reflux disease)    uses Baking Soda  . GIB (gastrointestinal bleeding) 2012  . Glaucoma    right eye  . Headache    occasionally  . Hepatitis C   . Hiatal hernia with gastroesophageal reflux disease and esophagitis 01/05/2018  . History of blood transfusion    no abnormal reaction noted  . History of gout    doesn't take any meds  . Hyperlipidemia    not on any meds  . Hypertension    takes Amlodipine and Lisinopril daily  . Insomnia    takes Ambien nightly  . Ischemic colitis ([place]) 2012  . Joint pain   . Nocturia   . Numbness    both legs occasionally  . PONV (postoperative nausea and vomiting)   . Prostate cancer ([place])   . Shortness of breath dyspnea    rarely but when notices he can be lying/sitting/exertion.Dr.Hochrein is aware per pt  . Stroke ([place]) 2016   . Urinary frequency   . Urinary urgency     Past Surgical History:  Procedure Laterality Date  . APPENDECTOMY    . BIOPSY  01/11/2018   Procedure: BIOPSY;  Surgeon: Gatha Mayer, MD;  Location: [facility] ENDOSCOPY;   Service: Endoscopy;;  . COLONOSCOPY    . COLONOSCOPY N/A 10/26/2015   Procedure: COLONOSCOPY;  Surgeon: Doran Stabler, MD;  Location: [facility] ENDOSCOPY;  Service: Endoscopy;  Laterality: N/A;  . ESOPHAGOGASTRODUODENOSCOPY    . ESOPHAGOGASTRODUODENOSCOPY N/A 10/26/2015   Procedure: ESOPHAGOGASTRODUODENOSCOPY (EGD);  Surgeon: Doran Stabler, MD;  Location: [facility] ENDOSCOPY;  Service: Endoscopy;  Laterality: N/A;  . ESOPHAGOGASTRODUODENOSCOPY (EGD) WITH PROPOFOL N/A 01/11/2018   Procedure: ESOPHAGOGASTRODUODENOSCOPY (EGD) WITH PROPOFOL;  Surgeon: Gatha Mayer, MD;  Location: [facility] ENDOSCOPY;  Service: Endoscopy;  Laterality: N/A;  . INGUINAL HERNIA REPAIR Right 11/04/2014   Procedure: RIGHT INGUINAL HERNIA REPAIR WITH MESH;  Surgeon: Jackolyn Confer, MD;  Location: [facility];  Service: General;  Laterality: Right;  . MASS EXCISION N/A 11/04/2014   Procedure: REMOVAL OF RIGHT GROIN SOFT TISSUE MASS;  Surgeon: Jackolyn Confer, MD;  Location: [facility];  Service: General;  Laterality: N/A;  . Multiple abdominal surgeries     total of 13  . PROSTATECTOMY    . SMALL INTESTINE SURGERY      Vitals:   10/18/18 1116  BP: (!) 172/79  Pulse: 72     Subjective Assessment - 10/18/18 1121  Subjective  This is the patient's second CVA, first CVA two years ago affected L side.  This CVA affected R side.  Following CVA pt participatd in CIR and HH therapies.  Pt arrived in w/c.  Pt is doing some walking with RW at home and fiance's assistance.  Prior to this CVA pt was using a cane.    Limitations  Walking    Patient Stated Goals  To walk without anything    Currently in Pain?  Yes    Pain Location  Shoulder    Pain Orientation  Left    Pain Descriptors / Indicators  Aching;Sore    Pain Type  Chronic pain    Pain Onset  More than a month ago    Pain Frequency  Constant         [facility] PT Assessment - 10/18/18 1125      Assessment   Medical Diagnosis  L thalamic CVA; new R weakness    Referring Provider (PT)   Dr. Alger Simons     Onset Date/Surgical Date  07/20/18    Hand Dominance  Right    Prior Therapy  CIR, HH therapies.  Only had HH therapies after first CVA      Precautions   Precautions  Other (comment)    Precaution Comments  OA, h/o CVA with residual L sided weakness, tobacco abuse, past substance abuse, Cameron ulcer, chronic LBP, GERD, GIB, glaucoma, headache, hepatitis C, hiatal hernia, inguinal hernia repair, history of blood transfusion, history of gout, hyperlipidemia, HTN, Insomnia, ischemic colitis, bilat LE numbness, nocturia, prostate CA with prostatectomy, SOB, urinary frequency and urgency and falls    Required Braces or Orthoses  Other Brace/Splint    Other Brace/Splint  using R foot up brace to prevent R toe drag      Restrictions   Weight Bearing Restrictions  No    Other Position/Activity Restrictions  no      Balance Screen   Has the patient fallen in the past 6 months  Yes   when reaching and turning   How many times?  3    Has the patient had a decrease in activity level because of a fear of falling?   Yes    Is the patient reluctant to leave their home because of a fear of falling?   Yes      [place] residence    Living Arrangements  Spouse/significant other    Type of [place] Access  Level entry    Home Layout  One level    [place] - 2 wheels;Wheelchair - manual;Cane - single point;Shower seat - built in    Additional Comments  Fiance ambulates with patient.  Uses RW in the house, w/c in the community      Prior Function   Level of Independence  Independent with household mobility with device;Independent with homemaking with ambulation;Independent with community mobility with device;Independent with transfers;Requires assistive device for independence      Observation/Other Assessments   Focus on Therapeutic Outcomes (FOTO)   53%; 47% impaired.  Predicted function 60%/40% impairment       Observation/Other Assessments-Edema    Edema  --   bilat LE edema, wearing compression stockings from fiance     Sensation   Light Touch  Appears Intact      Coordination   Gross Motor Movements are Fluid and Coordinated  No  Heel Shin Test  No evidence of ataxia or dysmetria      ROM / Strength   AROM / PROM / Strength  Strength      Strength   Overall Strength  Deficits    Overall Strength Comments  LLE: 4+/5 overall.  RLE: 3+ to 4-/5 overall.  Wearing OSSUR foot up brace on R      Transfers   Transfers  Sit to Stand;Stand Pivot Transfers;Stand to Sit    Sit to Stand  4: Min assist;With upper extremity assist    Stand to Sit  4: Min assist;With upper extremity assist;Uncontrolled descent    Stand Pivot Transfers  3: Mod assist    Stand Pivot Transfer Details (indicate cue type and reason)  without UE support, difficulty initiating stepping and pivoting with RLE, wide BOS      Ambulation/Gait   Ambulation/Gait  Yes    Ambulation/Gait Assistance  4: Min assist    Ambulation/Gait Assistance Details  with RW    Ambulation Distance (Feet)  25 Feet    Assistive device  Rolling walker    Gait Pattern  Step-through pattern;Decreased step length - right;Decreased step length - left;Decreased stride length;Decreased dorsiflexion - right;Scissoring;Narrow base of support;Poor foot clearance - right    Ambulation Surface  Level;Indoor      Standardized Balance Assessment   Standardized Balance Assessment  [name] Balance Test;10 meter walk test;Five Times Sit to Stand    Five times sit to stand comments   27 seconds    10 Meter Walk  TBA      [name] Balance Test   Sit to Stand  Needs minimal aid to stand or to stabilize    Standing Unsupported  Able to stand 2 minutes with supervision    Sitting with Back Unsupported but Feet Supported on Floor or Stool  Able to sit safely and securely 2 minutes    Stand to Sit  Controls descent by using hands    Transfers  Needs one person to  assist    Standing Unsupported with Eyes Closed  Able to stand 10 seconds with supervision    Standing Unsupported with Feet Together  Able to place feet together independently and stand for 1 minute with supervision    From Standing, Reach Forward with Outstretched Arm  Reaches forward but needs supervision    From Standing Position, Pick up Object from Floor  Able to pick up shoe, needs supervision    From Standing Position, Turn to Look Behind Over each Shoulder  Turn sideways only but maintains balance    Turn 360 Degrees  Needs assistance while turning    Standing Unsupported, Alternately Place Feet on Step/Stool  Needs assistance to keep from falling or unable to try    Standing Unsupported, One Foot in Front  Needs help to step but can hold 15 seconds    Standing on One Leg  Unable to try or needs assist to prevent fall    Total Score  25    [name] comment:  25/56                Objective measurements completed on examination: See above findings.              PT Education - 10/18/18 1220    Education Details  clinical findings, PT POC and goals for therapy    Person(s) Educated  Patient    Methods  Explanation    Comprehension  Verbalized understanding  PT Short Term Goals - 10/18/18 1232      PT SHORT TERM GOAL #1   Title  Pt will participate in further assessment of gait impairments and falls risk with 10 meter walk test and assessment of stair negotiation  (STG will need to be revised after 5/1 - 90 day POC)    Time  4    Period  Weeks    Status  New    Target Date  11/17/18      PT SHORT TERM GOAL #2   Title  Pt will perform initial HEP with supervision    Time  4    Period  Weeks    Status  New    Target Date  11/17/18      PT SHORT TERM GOAL #3   Title  Pt will improve LE strength as indicated by decrease in five time sit to stand to </= 20 seconds with use of UE     Baseline  27 seconds from mat with UE    Time  4    Period  Weeks     Status  New    Target Date  11/17/18      PT SHORT TERM GOAL #4   Title  Pt will decrease falls risk as indicated by increase in [name] score by 4 points    Baseline  25/56    Time  4    Period  Weeks    Status  New    Target Date  11/17/18      PT SHORT TERM GOAL #5   Title  Pt will perform stand pivot transfer with one UE support and supervision    Baseline  Supervision with RW; min A with no UE support    Time  4    Period  Weeks    Status  New    Target Date  11/17/18      Additional Short Term Goals   Additional Short Term Goals  Yes      PT SHORT TERM GOAL #6   Title  Pt will ambulate x 230' over indoor surfaces with RW MOD I and 115' over indoor surfaces with cane with min A    Time  4    Period  Weeks    Status  New    Target Date  11/17/18        PT Long Term Goals - 10/18/18 1238      PT LONG TERM GOAL #1   Title  Pt will demonstrate independence with HEP and walking program    Time  12    Period  Weeks    Status  New    Target Date  01/16/19      PT LONG TERM GOAL #2   Title  Pt will demonstrate gait velocity with cane >2.62 ft/sec     Baseline  TBD    Time  12    Period  Weeks    Status  New    Target Date  01/16/19      PT LONG TERM GOAL #3   Title  Pt will perform five time sit to stand in </= 15 seconds without use of UE    Time  12    Period  Weeks    Status  New    Target Date  01/16/19      PT LONG TERM GOAL #4   Title  Pt will improve [name] balance score to >/= 45/56  to indicate decreased falls risk    Time  12    Period  Weeks    Status  New    Target Date  01/16/19      PT LONG TERM GOAL #5   Title  Pt will ambulate with cane over indoor surfaces 230' MOD I; 150' over outdoor surfaces and negotiate curb with supervision    Time  12    Period  Weeks    Status  New    Target Date  01/16/19      Additional Long Term Goals   Additional Long Term Goals  Yes      PT LONG TERM GOAL #6   Title  Pt will negotiate 8 stairs with one  rail and alternating sequence to improve access to community buildings with supervision    Time  12    Period  Weeks    Status  New    Target Date  01/16/19      PT LONG TERM GOAL #7   Title  Pt will improve overall function as indicated by FOTO to >/= 60%    Baseline  53% function    Time  12    Period  Weeks    Status  New    Target Date  01/16/19             Plan - 10/18/18 1222    Clinical Impression Statement  Pt is a 68 year old male referred to Neuro OPPT for evaluation of R hemiplegia s/p L thalamic CVA on 07/20/2018.  Pt's PMH is significant for the following: OA, h/o multiple lacunar infarcts with residual L sided weakness, tobacco abuse, past substance abuse, Cameron ulcer, chronic LBP, GERD, GIB, glaucoma, headache, hepatitis C, hiatal hernia, inguinal hernia repair, history of blood transfusion, history of gout, hyperlipidemia, HTN, Insomnia, ischemic colitis, bilat LE numbness, nocturia, prostate CA with prostatectomy, SOB, urinary frequency and urgency and falls. The following deficits were noted during pt's exam: impaired strength in bilat LE: R > L weakness, impaired coordination, impaired balance, impaired gait, edema and pain in L shoulder.  Pt's five time sit to stand and [name] balance score indicates pt is at high risk for falls. Pt would benefit from skilled PT to address these impairments and functional limitations to maximize functional mobility independence and reduce falls risk.    Personal Factors and Comorbidities  Comorbidity 3+;Transportation;Other   continued tobacco use   Comorbidities  OA, h/o CVA with residual L sided weakness, tobacco abuse, past substance abuse, Cameron ulcer, chronic LBP, GERD, GIB, glaucoma, headache, hepatitis C, hiatal hernia, inguinal hernia repair, history of blood transfusion, history of gout, hyperlipidemia, HTN, Insomnia, ischemic colitis, bilat LE numbness, nocturia, prostate CA with prostatectomy, SOB, urinary frequency and  urgency and falls    Examination-Activity Limitations  Bend;Carry;Lift;Locomotion Level;Stairs;Stand;Transfers;Reach Overhead    Examination-Participation Restrictions  Church;Community Activity;Driving;Meal Prep    Stability/Clinical Decision Making  Evolving/Moderate complexity    Clinical Decision Making  Moderate    Rehab Potential  Good    PT Frequency  Other (comment)   1x/week, transition to 2x/week when COVID restrictions lifted   PT Duration  12 weeks    PT Treatment/Interventions  ADLs/Self Care Home Management;Electrical Stimulation;DME Instruction;Gait training;Stair training;Functional mobility training;Therapeutic activities;Therapeutic exercise;Balance training;Neuromuscular re-education;Patient/family education;Orthotic Fit/Training;Wheelchair mobility training    PT Next Visit Plan  CHECK VITALS EACH VISIT.  assess gait velocity, gait without AD/stairs.  Initiate HEP!    Consulted and Agree with Plan of  Care  Patient       Patient will benefit from skilled therapeutic intervention in order to improve the following deficits and impairments:  Abnormal gait, Decreased balance, Decreased coordination, Decreased strength, Difficulty walking, Increased edema, Impaired UE functional use, Pain  Visit Diagnosis: Hemiplegia and hemiparesis following cerebral infarction affecting right dominant side (HCC)  Other lack of coordination  Unsteadiness on feet  Other abnormalities of gait and mobility     Problem List Patient Active Problem List   Diagnosis Date Noted  . Acute on chronic anemia   . Pain   . Agitation 07/26/2018  . Hepatitis C 07/26/2018  . Nontraumatic thalamic hemorrhage ([place]) 07/26/2018  . Osteoarthritis of left hip 04/06/2018  . Cameron ulcer, chronic 01/11/2018  . Symptomatic anemia 01/10/2018  . Syncope 01/10/2018  . Hiatal hernia with gastroesophageal reflux disease and esophagitis 01/05/2018  . Insomnia 01/05/2018  . Rotator cuff syndrome of left  shoulder 11/21/2017  . Anemia, iron deficiency   . Gastrointestinal bleeding 10/24/2015  . Tobacco abuse   . Acute ischemic stroke ([place])   . Stroke ([place]) 05/31/2015  . HTN (hypertension) 04/10/2014  . Inguinal hernia without mention of obstruction or gangrene, unilateral or unspecified, (not specified as recurrent)-right 01/30/2014    Rico Junker, PT, DPT 10/18/18    1:41 PM    [facility] [address] [place], [place], [address] Phone: [phone]   Fax:  [phone]  Name: [name] MRN: [id number] Date of Birth: 1951-06-30

## 2018-10-18 NOTE — Therapy (Signed)
[facility] [address] [place], [place], [address] Phone: [phone]   Fax:  [phone]  Occupational Therapy Evaluation  Patient Details  Name: [name] MRN: [id number] Date of Birth: 1951-01-01 Referring Provider (OT): Donetta Potts   Encounter Date: 10/18/2018  OT End of Session - 10/18/18 1254    Visit Number  1    Number of Visits  20    Date for OT Re-Evaluation  01/17/19    Authorization Type  [employer] MCR    OT Start Time  1145    OT Stop Time  1230    OT Time Calculation (min)  45 min    Activity Tolerance  Patient tolerated treatment well    Behavior During Therapy  [facility] for tasks assessed/performed       Past Medical History:  Diagnosis Date  . Arthritis   . Cameron ulcer, chronic 01/11/2018  . Chronic back pain   . GERD (gastroesophageal reflux disease)    uses Baking Soda  . GIB (gastrointestinal bleeding) 2012  . Glaucoma    right eye  . Headache    occasionally  . Hepatitis C   . Hiatal hernia with gastroesophageal reflux disease and esophagitis 01/05/2018  . History of blood transfusion    no abnormal reaction noted  . History of gout    doesn't take any meds  . Hyperlipidemia    not on any meds  . Hypertension    takes Amlodipine and Lisinopril daily  . Insomnia    takes Ambien nightly  . Ischemic colitis ([place]) 2012  . Joint pain   . Nocturia   . Numbness    both legs occasionally  . PONV (postoperative nausea and vomiting)   . Prostate cancer ([place])   . Shortness of breath dyspnea    rarely but when notices he can be lying/sitting/exertion.Dr.Hochrein is aware per pt  . Stroke ([place]) 2016   . Urinary frequency   . Urinary urgency     Past Surgical History:  Procedure Laterality Date  . APPENDECTOMY    . BIOPSY  01/11/2018   Procedure: BIOPSY;  Surgeon: Gatha Mayer, MD;  Location: [facility] ENDOSCOPY;  Service: Endoscopy;;  . COLONOSCOPY    . COLONOSCOPY N/A 10/26/2015   Procedure: COLONOSCOPY;  Surgeon: Doran Stabler, MD;  Location: [facility] ENDOSCOPY;  Service: Endoscopy;  Laterality: N/A;  . ESOPHAGOGASTRODUODENOSCOPY    . ESOPHAGOGASTRODUODENOSCOPY N/A 10/26/2015   Procedure: ESOPHAGOGASTRODUODENOSCOPY (EGD);  Surgeon: Doran Stabler, MD;  Location: [facility] ENDOSCOPY;  Service: Endoscopy;  Laterality: N/A;  . ESOPHAGOGASTRODUODENOSCOPY (EGD) WITH PROPOFOL N/A 01/11/2018   Procedure: ESOPHAGOGASTRODUODENOSCOPY (EGD) WITH PROPOFOL;  Surgeon: Gatha Mayer, MD;  Location: [facility] ENDOSCOPY;  Service: Endoscopy;  Laterality: N/A;  . INGUINAL HERNIA REPAIR Right 11/04/2014   Procedure: RIGHT INGUINAL HERNIA REPAIR WITH MESH;  Surgeon: Jackolyn Confer, MD;  Location: [facility];  Service: General;  Laterality: Right;  . MASS EXCISION N/A 11/04/2014   Procedure: REMOVAL OF RIGHT GROIN SOFT TISSUE MASS;  Surgeon: Jackolyn Confer, MD;  Location: [facility];  Service: General;  Laterality: N/A;  . Multiple abdominal surgeries     total of 13  . PROSTATECTOMY    . SMALL INTESTINE SURGERY      There were no vitals filed for this visit.  Subjective Assessment - 10/18/18 1155    Pertinent History  CVA w/ Rt hemiparesis 07/20/18. PMH: CVA w/ residual Lt hemiparesis (approx 2 years ago), mutiple lacunar  infarcts, Hep C, HTN, polysubstance abuse    Limitations  fall risk, no driving    Currently in Pain?  Yes    Pain Score  10-Worst pain ever    Pain Location  Shoulder    Pain Orientation  Left    Pain Descriptors / Indicators  Aching;Sore    Pain Type  Chronic pain   worse in Lt shoulder since new stroke which affected Rt side   Pain Onset  More than a month ago    Pain Frequency  Constant    Aggravating Factors   laying on Lt side    Pain Relieving Factors  hot towel        [facility] OT Assessment - 10/18/18 1200      Assessment   Medical Diagnosis  L thalamic CVA    Referring Provider (OT)  Donetta Potts    Onset Date/Surgical Date  07/20/18    Hand Dominance  Right    Prior  Therapy  CIR, HH therapies.  Only had HH therapies after first CVA      Precautions   Precautions  Other (comment);Fall    Precaution Comments  no driving      Balance Screen   Has the patient fallen in the past 6 months  Yes    How many times?  [place] Shower/Tub  Walk-in Shower;Curtain   grab bar, built in shower seat   Additional Comments  Pt lives w/ fiancee, fiancees daughter and 2 grandchildren    Lives With  Family      Prior Function   Level of Independence  Independent    Vocation  Retired      ADL   Eating/Feeding  Independent    Grooming  Modified independent    Upper Body Bathing  Modified independent   except back   Lower Body Bathing  Modified independent   seated   Upper Body Dressing  Increased time    Lower Body Dressing  Increased time    Armed forces technical officer  Modified independent    Toileting - Clothing Manipulation  Modified independent    [place] Transfer  Supervision/safety      IADL   Shopping  Completely unable to shop    Light Housekeeping  --   Currently not doing   Meal Prep  Able to complete simple warm meal prep;Able to complete simple cold meal and snack prep   Currently performing light cooking   Community Mobility  Relies on family or friends for transportation   [facility] transportation brought pt today   Medication Management  Has difficulty remembering to take medication   [employer] financial matters independently (budgets, writes checks, pays rent, bills goes to [employer]), collects and keeps track of income   pays bills via phone     Mobility   Mobility Status  Needs assist    Mobility Status Comments  walker in home, w/c for community      Written Expression   Dominant Hand  Right    Handwriting  50% legible   75% in print, name only     Vision - History   Baseline Vision  Wears glasses only for reading    Additional  Comments  denies change from stroke      Cognition   Memory  Impaired    Cognition Comments  max difficulty subtracting by 7"s w/ decreased working memory noted - pt required cues to remember what number he was on. Delayed recall 1/3      Observation/Other Assessments   Observations  RUE coordination more impaired, Lt shoulder impaired in ROM and pain (worse since new stroke)      Sensation   Light Touch  Appears Intact      Coordination   9 Hole Peg Test  Right;Left    Right 9 Hole Peg Test  52.03 sec    Left 9 Hole Peg Test  32.75 sec      Praxis   Praxis  Impaired    Praxis Impairment Details  Motor planning      Edema   Edema  none in UE's      ROM / Strength   AROM / PROM / Strength  AROM      AROM   Overall AROM Comments  RUE AROM WFL's with decreased control (? mild ataxia) and stiffness at end range. LUE: shoulder flex 50*, abd 40*, ER/IR approx 50%, elbow distally WFL's.       Hand Function   Right Hand Grip (lbs)  66 lbs    Left Hand Grip (lbs)  53 lbs                        OT Short Term Goals - 10/18/18 1301      OT SHORT TERM GOAL #1   Title  Independent with HEP for Rt hand coordination and Lt shoulder    Time  4    Period  Weeks    Status  New      OT SHORT TERM GOAL #2   Title  Pt to verbalize understanding with memory strategies for medication management, appointments, and for cooking    Time  4    Period  Weeks    Status  New      OT SHORT TERM GOAL #3   Title  Pt to report pain less than or equal to 5/10 Lt shoulder in supine ROM    Time  4    Period  Weeks    Status  New      OT SHORT TERM GOAL #4   Title  Pt to verbalize understanding with proper positioning of LUE in bed and w/ low range reaching    Time  4    Period  Weeks    Status  New      OT SHORT TERM GOAL #5   Title  Pt to improve coordination Rt hand as evidenced by performing 9 hole peg test in 45 sec. or less    Baseline  52.03 sec    Time  4    Period   Weeks    Status  New        OT Long Term Goals - 10/18/18 1305      OT LONG TERM GOAL #1   Title  Independent with updated HEP for Lt shoulder - 01/17/19    Time  12    Period  Weeks    Status  New      OT LONG TERM GOAL #2   Title  Pt to perform LUE reaching for light objects to 75* or higher with pain less than or equal to 5/10    Baseline  approx 50* w/ pain 10/10    Time  12    Period  Weeks  Status  New      OT LONG TERM GOAL #3   Title  Pt to improve coordination RT dominant hand as evidenced by performing 9 hole peg test in 35 sec. or less    Baseline  52.08 sec.     Time  12    Period  Weeks    Status  New      OT LONG TERM GOAL #4   Title  Pt to demo cooking w/ DME prn demo good safety in balance and cognition at distant supervision level    Time  12    Period  Weeks    Status  New      OT LONG TERM GOAL #5   Title  Pt to write sentence at 90% legibility or higher    Time  12    Period  Weeks    Status  New            Plan - 10/18/18 1256    Clinical Impression Statement  Pt is a 68 y.o. male who presents to outpatient rehab s/p thalamic hemmorhagic stroke on 07/20/18 affecting Rt dominant side, however pt also reports Lt shoulder ROM and pain is much worse since recent stroke. Pt has had prior CVA w/ residual Lt hemiparesis, but pt reports Lt shoulder much worse now. Pt has also had multiple lacunar infarcts. Pt presents with decreased coordination and apraxia Rt side, and decreased ROM and increased pain Lt shoulder.     OT Occupational Profile and History  Detailed Assessment- Review of Records and additional review of physical, cognitive, psychosocial history related to current functional performance    Occupational performance deficits (Please refer to evaluation for details):  IADL's;Social Participation;Rest and Sleep    Body Structure / Function / Physical Skills  ADL;Coordination;Endurance;UE functional use;Decreased knowledge of  precautions;Balance;Body mechanics;Decreased knowledge of use of DME;IADL;FMC;Dexterity;Strength;ROM;Tone;Mobility    Restaurant manager, fast food;Safety Awareness;Memory;Problem Solve    Rehab Potential  Good    Clinical Decision Making  Several treatment options, min-mod task modification necessary    Comorbidities Affecting Occupational Performance:  Presence of comorbidities impacting occupational performance    Comorbidities impacting occupational performance description:  previous strokes w/ Lt hemiparesis    Modification or Assistance to Complete Evaluation   Min-Moderate modification of tasks or assist with assess necessary to complete eval    OT Frequency  1x / week    OT Duration  4 weeks   will likely increase to 2x/wk x 8 weeks when CoVid19 restrictions at clinic lifted   OT Treatment/Interventions  Self-care/ADL training;Therapeutic exercise;Visual/perceptual remediation/compensation;Moist Heat;Neuromuscular education;Patient/family education;Splinting;Therapist, nutritional;Therapeutic activities;DME and/or AE instruction;Manual Therapy;Passive range of motion;Cognitive remediation/compensation    Plan  coordination HEP for Rt hand, HEP for Lt shoulder (supine, table slides) as tolerated, bed positioning handout    Consulted and Agree with Plan of Care  Patient       Patient will benefit from skilled therapeutic intervention in order to improve the following deficits and impairments:  Body Structure / Function / Physical Skills, Cognitive Skills  Visit Diagnosis: Hemiplegia and hemiparesis following cerebral infarction affecting right dominant side ([place]) - Plan: Ot plan of care cert/re-cert  Other lack of coordination - Plan: Ot plan of care cert/re-cert  Unsteadiness on feet - Plan: Ot plan of care cert/re-cert  Apraxia - Plan: Ot plan of care cert/re-cert  Other symptoms and signs involving cognitive functions following cerebral infarction - Plan: Ot plan of care  cert/re-cert  Acute pain  of left shoulder - Plan: Ot plan of care cert/re-cert  Stiffness of left shoulder, not elsewhere classified - Plan: Ot plan of care cert/re-cert    Problem List Patient Active Problem List   Diagnosis Date Noted  . Acute on chronic anemia   . Pain   . Agitation 07/26/2018  . Hepatitis C 07/26/2018  . Nontraumatic thalamic hemorrhage ([place]) 07/26/2018  . Osteoarthritis of left hip 04/06/2018  . Cameron ulcer, chronic 01/11/2018  . Symptomatic anemia 01/10/2018  . Syncope 01/10/2018  . Hiatal hernia with gastroesophageal reflux disease and esophagitis 01/05/2018  . Insomnia 01/05/2018  . Rotator cuff syndrome of left shoulder 11/21/2017  . Anemia, iron deficiency   . Gastrointestinal bleeding 10/24/2015  . Tobacco abuse   . Acute ischemic stroke ([place])   . Stroke ([place]) 05/31/2015  . HTN (hypertension) 04/10/2014  . Inguinal hernia without mention of obstruction or gangrene, unilateral or unspecified, (not specified as recurrent)-right 01/30/2014    Carey Bullocks, OTR/L 10/18/2018, 1:15 PM  [facility] [address] [place], [place], [address] Phone: [phone]   Fax:  [phone]  Name: [name] MRN: [id number] Date of Birth: 1951/02/06

## 2018-10-18 NOTE — Therapy (Signed)
[facility] [address] [place], [place], [address] Phone: [phone]   Fax:  [phone]  Speech Language Pathology Evaluation  Patient Details  Name: [name] MRN: [id number] Date of Birth: [birth year] Referring Provider (SLP): Alger Simons MD   Encounter Date: 10/18/2018  End of Session - 10/18/18 1544    Visit Number  1    Number of Visits  17    Date for SLP Re-Evaluation  01/16/19    SLP Start Time  7494    SLP Stop Time   1315    SLP Time Calculation (min)  40 min    Activity Tolerance  Patient tolerated treatment well       Past Medical History:  Diagnosis Date  . Arthritis   . Cameron ulcer, chronic 01/11/2018  . Chronic back pain   . GERD (gastroesophageal reflux disease)    uses Baking Soda  . GIB (gastrointestinal bleeding) 2012  . Glaucoma    right eye  . Headache    occasionally  . Hepatitis C   . Hiatal hernia with gastroesophageal reflux disease and esophagitis 01/05/2018  . History of blood transfusion    no abnormal reaction noted  . History of gout    doesn't take any meds  . Hyperlipidemia    not on any meds  . Hypertension    takes Amlodipine and Lisinopril daily  . Insomnia    takes Ambien nightly  . Ischemic colitis ([place]) 2012  . Joint pain   . Nocturia   . Numbness    both legs occasionally  . PONV (postoperative nausea and vomiting)   . Prostate cancer ([place])   . Shortness of breath dyspnea    rarely but when notices he can be lying/sitting/exertion.Dr.Hochrein is aware per pt  . Stroke ([place]) 2016   . Urinary frequency   . Urinary urgency     Past Surgical History:  Procedure Laterality Date  . APPENDECTOMY    . BIOPSY  01/11/2018   Procedure: BIOPSY;  Surgeon: Gatha Mayer, MD;  Location: [facility] ENDOSCOPY;  Service: Endoscopy;;  . COLONOSCOPY    . COLONOSCOPY N/A 10/26/2015   Procedure: COLONOSCOPY;  Surgeon: Doran Stabler, MD;  Location: [facility] ENDOSCOPY;   Service: Endoscopy;  Laterality: N/A;  . ESOPHAGOGASTRODUODENOSCOPY    . ESOPHAGOGASTRODUODENOSCOPY N/A 10/26/2015   Procedure: ESOPHAGOGASTRODUODENOSCOPY (EGD);  Surgeon: Doran Stabler, MD;  [facility] ENDOSCOPY;  Service: Endoscopy;  Laterality: N/A;  . ESOPHAGOGASTRODUODENOSCOPY (EGD) WITH PROPOFOL N/A 01/11/2018   Procedure: ESOPHAGOGASTRODUODENOSCOPY (EGD) WITH PROPOFOL;  Surgeon: Gatha Mayer, MD;  Location: [facility] ENDOSCOPY;  Service: Endoscopy;  Laterality: N/A;  . INGUINAL HERNIA REPAIR Right 11/04/2014   Procedure: RIGHT INGUINAL HERNIA REPAIR WITH MESH;  Surgeon: Jackolyn Confer, MD;  Location: [facility];  Service: General;  Laterality: Right;  . MASS EXCISION N/A 11/04/2014   Procedure: REMOVAL OF RIGHT GROIN SOFT TISSUE MASS;  Surgeon: Jackolyn Confer, MD;  Location: [facility];  Service: General;  Laterality: N/A;  . Multiple abdominal surgeries     total of 13  . PROSTATECTOMY    . SMALL INTESTINE SURGERY      There were no vitals filed for this visit.  Subjective Assessment - 10/18/18 1243    Subjective  "Somebody could have told me something and 10 minutes later I don't even remember it."    Currently in Pain?  Yes    Pain Score  10-Worst pain ever  Pain Location  Shoulder    Pain Orientation  Left    Pain Descriptors / Indicators  Aching;Sore    Pain Type  Chronic pain    Pain Onset  More than a month ago    Pain Frequency  Constant    Aggravating Factors   "laying down makes it ache"    Pain Relieving Factors  heat         SLP Evaluation [facility] - 10/18/18 1243      SLP Visit Information   SLP Received On  10/18/18    Referring Provider (SLP)  Alger Simons MD    Medical Diagnosis  CVA      Subjective   Patient/Family Stated Goal  "My walking and my shoulder. Add my memory in there too."      General Information   HPI  Pt with CVA signs/symptoms reportedly on 07/19/18 of slurred speech and difficulty walking. Arrived at ED and was hospitalized until late January  2020, began [place] but "I can't remember any of it." Memory complaints. Pt's second CVA (first was pontine CVA), Reportedly pt had recovered completely from pontine CVA.       Balance Screen   Has the patient fallen in the past 6 months  Yes    How many times?  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 Pt recalled date of his stroke.       Oral Motor/Sensory Function   Overall Oral Motor/Sensory Function  Impaired    Labial ROM  Reduced right    Lingual ROM  Reduced right   slight   Overall Oral Motor/Sensory Function  mild sluggish movement on rt- lingually and labially      Motor Speech   Overall Motor Speech  Impaired   pt endorses he is not bothered by dysarthria   Respiration  Within functional limits    Phonation  Normal    Articulation  Impaired    Level of Impairment  Conversation    Intelligibility  Intelligible      Standardized Assessments   Standardized Assessments   Other Assessment   Hopkins Verbal Learning Test   Other Assessment  RECALL: 0/12 (below WNL), 2/12 (below WNL), 5/12 (below WNL); RECOGNITION: pt recognized 9/12 words (below WNL)                      SLP Education - 10/18/18 1541    Education Details  Eval results, need to do full cognitive eval    Person(s) Educated  Patient    Methods  Explanation    Comprehension  Verbalized understanding;Need further instruction       SLP Short Term Goals - 10/18/18 1550      SLP SHORT TERM GOAL #1   Title  pt will utilize memory compensations in 3 sessions    Time  5    Period  Weeks    Status   New      SLP SHORT TERM GOAL #2   Title  pt will undergo full cognitive testing in the first two therapy sessions    Time  2    Period  Weeks    Status  New       SLP Long Term Goals - 10/18/18 1601      SLP LONG TERM GOAL #1   Title  pt will utilize memory compnesations when appropriate to do so, in 90% of opportunities over three sessions    Time  10    Period  Weeks   or 20 visits   Status  New       Plan - 10/18/18 1545    Clinical Impression Statement  Pt presents today with (at least) memory deficits, goals written for this. Full cognitive linguistic eval to follow in first 2 sessions, goals added as necessary. Pt also with mild dysarthria but tells SLP he does not want to target this result of his latest CVA. Pt would benefit from skilled ST to target cognitive deficits as well as dysarthrai, however pt desires to only work on cognitive linguisitcs at this time., to improve his independen    Speech Therapy Frequency  2x / week   however x1/week x4 weeks, then x2/week for 8 weeks due to COVID-19 crisis   Duration  --   x10 weeks, or 20 visits   Treatment/Interventions  SLP instruction and feedback;Patient/family education;Internal/external aids;Cognitive reorganization;Cueing hierarchy;Environmental controls;Compensatory techniques;Functional tasks    Potential to Achieve Goals  Good    Potential Considerations  Family/community support;Ability to learn/carryover information    Consulted and Agree with Plan of Care  Patient       Patient will benefit from skilled therapeutic intervention in order to improve the following deficits and impairments:   Cognitive communication deficit - Plan: SLP plan of care cert/re-cert  Dysarthria and anarthria - Plan: SLP plan of care cert/re-cert    Problem List Patient Active Problem List   Diagnosis Date Noted  . Acute on chronic anemia   . Pain   . Agitation 07/26/2018  . Hepatitis C 07/26/2018  . Nontraumatic thalamic  hemorrhage ([place]) 07/26/2018  . Osteoarthritis of left hip 04/06/2018  . Cameron ulcer, chronic 01/11/2018  . Symptomatic anemia 01/10/2018  . Syncope 01/10/2018  . Hiatal hernia with gastroesophageal reflux disease and esophagitis 01/05/2018  . Insomnia 01/05/2018  . Rotator cuff syndrome of left shoulder 11/21/2017  . Anemia, iron deficiency   . Gastrointestinal bleeding 10/24/2015  . Tobacco abuse   . Acute ischemic stroke ([place])   . Stroke ([place]) 05/31/2015  . HTN (hypertension) 04/10/2014  . Inguinal hernia without mention of obstruction or gangrene, unilateral or unspecified, (not specified as recurrent)-right 01/30/2014    [facility] ,MS, CCC-SLP  10/18/2018, 4:16 PM  [facility] [address] [place], [place], [address] Phone: [phone]   Fax:  [phone]  Name: Sandy Blouch MRN: [id number] Date of Birth: 05/25/1951

## 2018-10-25 ENCOUNTER — Ambulatory Visit: Payer: Medicare Other | Admitting: Physical Therapy

## 2018-10-25 ENCOUNTER — Ambulatory Visit: Payer: Medicare Other | Admitting: Occupational Therapy

## 2018-10-25 ENCOUNTER — Ambulatory Visit: Payer: Medicare Other

## 2018-10-27 ENCOUNTER — Ambulatory Visit: Payer: Medicare Other | Admitting: Occupational Therapy

## 2018-10-27 ENCOUNTER — Encounter: Payer: Self-pay | Admitting: Physical Therapy

## 2018-10-27 ENCOUNTER — Ambulatory Visit: Payer: Medicare Other | Admitting: Physical Therapy

## 2018-10-27 ENCOUNTER — Other Ambulatory Visit: Payer: Self-pay

## 2018-10-27 VITALS — BP 129/64 | HR 70

## 2018-10-27 DIAGNOSIS — R2689 Other abnormalities of gait and mobility: Secondary | ICD-10-CM

## 2018-10-27 DIAGNOSIS — I69351 Hemiplegia and hemiparesis following cerebral infarction affecting right dominant side: Secondary | ICD-10-CM | POA: Diagnosis not present

## 2018-10-27 DIAGNOSIS — M25612 Stiffness of left shoulder, not elsewhere classified: Secondary | ICD-10-CM

## 2018-10-27 DIAGNOSIS — I69318 Other symptoms and signs involving cognitive functions following cerebral infarction: Secondary | ICD-10-CM

## 2018-10-27 DIAGNOSIS — R278 Other lack of coordination: Secondary | ICD-10-CM

## 2018-10-27 DIAGNOSIS — R2681 Unsteadiness on feet: Secondary | ICD-10-CM

## 2018-10-27 DIAGNOSIS — M25512 Pain in left shoulder: Secondary | ICD-10-CM

## 2018-10-27 NOTE — Therapy (Signed)
[facility] [address] [place], [place], [address] Phone: [phone]   Fax:  [phone]  Physical Therapy Treatment  Patient Details  Name: [name] MRN: [id number] Date of Birth: Mar 01, 1951 Referring Provider (PT): Dr. Alger Simons    Encounter Date: 10/27/2018  PT End of Session - 10/27/18 1522    Visit Number  2    Number of Visits  13    Date for PT Re-Evaluation  01/16/19    Authorization Type  [employer] Medicare: 10th visit PN    PT Start Time  1233    PT Stop Time  1315    PT Time Calculation (min)  42 min    Equipment Utilized During Treatment  Gait belt    Activity Tolerance  Patient tolerated treatment well    Behavior During Therapy  WFL for tasks assessed/performed       Past Medical History:  Diagnosis Date  . Arthritis   . Cameron ulcer, chronic 01/11/2018  . Chronic back pain   . GERD (gastroesophageal reflux disease)    uses Baking Soda  . GIB (gastrointestinal bleeding) 2012  . Glaucoma    right eye  . Headache    occasionally  . Hepatitis C   . Hiatal hernia with gastroesophageal reflux disease and esophagitis 01/05/2018  . History of blood transfusion    no abnormal reaction noted  . History of gout    doesn't take any meds  . Hyperlipidemia    not on any meds  . Hypertension    takes Amlodipine and Lisinopril daily  . Insomnia    takes Ambien nightly  . Ischemic colitis ([place]) 2012  . Joint pain   . Nocturia   . Numbness    both legs occasionally  . PONV (postoperative nausea and vomiting)   . Prostate cancer ([place])   . Shortness of breath dyspnea    rarely but when notices he can be lying/sitting/exertion.Dr.Hochrein is aware per pt  . Stroke ([place]) 2016   . Urinary frequency   . Urinary urgency     Past Surgical History:  Procedure Laterality Date  . APPENDECTOMY    . BIOPSY  01/11/2018   Procedure: BIOPSY;  Surgeon: Gatha Mayer, MD;  Location: [facility] ENDOSCOPY;   Service: Endoscopy;;  . COLONOSCOPY    . COLONOSCOPY N/A 10/26/2015   Procedure: COLONOSCOPY;  Surgeon: Doran Stabler, MD;  Location[facility] ENDOSCOPY;  Service: Endoscopy;  Laterality: N/A;  . ESOPHAGOGASTRODUODENOSCOPY    . ESOPHAGOGASTRODUODENOSCOPY N/A 10/26/2015   Procedure: ESOPHAGOGASTRODUODENOSCOPY (EGD);  Surgeon: Doran Stabler, MD;  Location: [facility] ENDOSCOPY;  Service: Endoscopy;  Laterality: N/A;  . ESOPHAGOGASTRODUODENOSCOPY (EGD) WITH PROPOFOL N/A 01/11/2018   Procedure: ESOPHAGOGASTRODUODENOSCOPY (EGD) WITH PROPOFOL;  Surgeon: Gatha Mayer, MD;  Location: [facility] ENDOSCOPY;  Service: Endoscopy;  Laterality: N/A;  . INGUINAL HERNIA REPAIR Right 11/04/2014   Procedure: RIGHT INGUINAL HERNIA REPAIR WITH MESH;  Surgeon: Jackolyn Confer, MD;  Location: [facility];  Service: General;  Laterality: Right;  . MASS EXCISION N/A 11/04/2014   Procedure: REMOVAL OF RIGHT GROIN SOFT TISSUE MASS;  Surgeon: Jackolyn Confer, MD;  Location: [facility];  Service: General;  Laterality: N/A;  . Multiple abdominal surgeries     total of 13  . PROSTATECTOMY    . SMALL INTESTINE SURGERY      Vitals:   10/27/18 1236  BP: 129/64  Pulse: 70    Subjective Assessment - 10/27/18 1236  Subjective  Doing okay today.  No falls.    Limitations  Walking    Patient Stated Goals  To walk without anything    Currently in Pain?  Yes    Pain Score  4     Pain Location  Shoulder    Pain Orientation  Left    Pain Descriptors / Indicators  Aching;Sore    Pain Type  Chronic pain    Pain Onset  More than a month ago    Pain Frequency  Constant    Aggravating Factors   movements    Pain Relieving Factors  unknown                       [facility] Adult PT Treatment/Exercise - 10/27/18 0001      Ambulation/Gait   Ambulation/Gait  Yes    Ambulation/Gait Assistance  4: Min assist;4: Min guard    Ambulation Distance (Feet)  60 Feet   x 4, then 50 ft   Assistive device  Rolling walker    Gait Pattern   Step-through pattern;Decreased step length - right;Decreased step length - left;Decreased stride length;Decreased dorsiflexion - right;Scissoring;Narrow base of support;Poor foot clearance - right    Ambulation Surface  Level;Indoor    Gait velocity  33.56 sec over 32.8 ft = 0.98 ft/sec    Stairs  Yes    Stairs Assistance  4: Min assist    Stairs Assistance Details (indicate cue type and reason)  Pt does not fully place R foot on step ascending; pt descends steps in sideways/diagonal manner with RLE descending first and little room left for L foot placement.  He reports having wider steps at home and no difficulty with foot placement on steps to get into home.    Stair Management Technique  One rail Right;Alternating pattern;Forwards;Step to pattern;Sideways   alt pattern acsending, step to pattern sideways descending   Number of Stairs  4    Height of Stairs  6      Exercises   Exercises  Knee/Hip;Ankle      Knee/Hip Exercises: Seated   Long Arc Quad  Strengthening;Right;Left;10 reps    Long Arc Quad Limitations  Pt performing at home with theraband; cued to slow pace    Other Seated Knee/Hip Exercises  Seated hip abduction-stepping out and in, RLE x 10 reps-tactile cues to avoid crossing midline with return to start position    Hamstring Curl  Strengthening;Right;Left;1 set;10 reps   blue theraband   Sit to Sand  1 set;5 reps   from bed height, cues for decreased UE support     Ankle Exercises: Seated   Heel Raises  Right;Left;10 reps    Toe Raise  10 reps   blue theraband for resistance   Toe Raise Limitations  fatigues after 8th rep    Other Seated Ankle Exercises  Seated resisted ankle plantarflexion, blue theraband x 5 reps        Pt demonstrates/verbally describes doing seated LAQ, seated hip abduction and adduction with theraband around knees.  Pt needs brief breaks between exercises, with reported RLE fatigue. He does report initially progressing from yellow theraband to  blue theraband at home with HHPT.  However, after performing exercises with blue theraband here today, he notes this blue theraband is stronger.  He still requests to take it home and try it (his form was good with exercises today, just tended to fatigue before 10th rep).  Advised him that we could  back it down and give him green or red if he finds this blue is too hard with HEP.     PT Education - 10/27/18 1521    Education Details  Educated in [employer] additions-see instructions; pt is using blue theraband at home issued from home health(which he believes is lighter than our blue band); provided blue theraband for home    Person(s) Educated  Patient    Methods  Explanation;Demonstration;Handout    Comprehension  Verbalized understanding;Returned demonstration       PT Short Term Goals - 10/18/18 1232      PT SHORT TERM GOAL #1   Title  Pt will participate in further assessment of gait impairments and falls risk with 10 meter walk test and assessment of stair negotiation  (STG will need to be revised after 5/1 - 90 day POC)    Time  4    Period  Weeks    Status  New    Target Date  11/17/18      PT SHORT TERM GOAL #2   Title  Pt will perform initial HEP with supervision    Time  4    Period  Weeks    Status  New    Target Date  11/17/18      PT SHORT TERM GOAL #3   Title  Pt will improve LE strength as indicated by decrease in five time sit to stand to </= 20 seconds with use of UE     Baseline  27 seconds from mat with UE    Time  4    Period  Weeks    Status  New    Target Date  11/17/18      PT SHORT TERM GOAL #4   Title  Pt will decrease falls risk as indicated by increase in BERG score by 4 points    Baseline  25/56    Time  4    Period  Weeks    Status  New    Target Date  11/17/18      PT SHORT TERM GOAL #5   Title  Pt will perform stand pivot transfer with one UE support and supervision    Baseline  Supervision with RW; min A with no UE support    Time  4     Period  Weeks    Status  New    Target Date  11/17/18      Additional Short Term Goals   Additional Short Term Goals  Yes      PT SHORT TERM GOAL #6   Title  Pt will ambulate x 230' over indoor surfaces with RW MOD I and 115' over indoor surfaces with cane with min A    Time  4    Period  Weeks    Status  New    Target Date  11/17/18        PT Long Term Goals - 10/18/18 1238      PT LONG TERM GOAL #1   Title  Pt will demonstrate independence with HEP and walking program    Time  12    Period  Weeks    Status  New    Target Date  01/16/19      PT LONG TERM GOAL #2   Title  Pt will demonstrate gait velocity with cane >2.62 ft/sec     Baseline  TBD    Time  12    Period  Weeks  Status  New    Target Date  01/16/19      PT LONG TERM GOAL #3   Title  Pt will perform five time sit to stand in </= 15 seconds without use of UE    Time  12    Period  Weeks    Status  New    Target Date  01/16/19      PT LONG TERM GOAL #4   Title  Pt will improve BERG balance score to >/= 45/56 to indicate decreased falls risk    Time  12    Period  Weeks    Status  New    Target Date  01/16/19      PT LONG TERM GOAL #5   Title  Pt will ambulate with cane over indoor surfaces 230' MOD I; 150' over outdoor surfaces and negotiate curb with supervision    Time  12    Period  Weeks    Status  New    Target Date  01/16/19      Additional Long Term Goals   Additional Long Term Goals  Yes      PT LONG TERM GOAL #6   Title  Pt will negotiate 8 stairs with one rail and alternating sequence to improve access to community buildings with supervision    Time  12    Period  Weeks    Status  New    Target Date  01/16/19      PT LONG TERM GOAL #7   Title  Pt will improve overall function as indicated by FOTO to >/= 60%    Baseline  53% function    Time  12    Period  Weeks    Status  New    Target Date  01/16/19            Plan - 10/27/18 1523    Clinical Impression Statement   Focused time today looking at stair negotiation and gait velocity; gait velocity of 0.98 ft/sec indicates fall risk.  He has difficulty safely negoitating our stairs in clinic due to poor foot placement, but reports his steps to get into home are wider and he has no difficulty with use of rail.  Initiated HEP for leg strengthening (after pt talked through/demonstrated some of his exercises from HHPT).  Pt will benefit from skilled PT to further address strength, balance, and gait training to return to independent, safe level of function.    Personal Factors and Comorbidities  Comorbidity 3+;Transportation;Other   continued tobacco use   Comorbidities  OA, h/o CVA with residual L sided weakness, tobacco abuse, past substance abuse, Cameron ulcer, chronic LBP, GERD, GIB, glaucoma, headache, hepatitis C, hiatal hernia, inguinal hernia repair, history of blood transfusion, history of gout, hyperlipidemia, HTN, Insomnia, ischemic colitis, bilat LE numbness, nocturia, prostate CA with prostatectomy, SOB, urinary frequency and urgency and falls    Examination-Activity Limitations  Bend;Carry;Lift;Locomotion Level;Stairs;Stand;Transfers;Reach Overhead    Examination-Participation Restrictions  Church;Community Activity;Driving;Meal Prep    Stability/Clinical Decision Making  Evolving/Moderate complexity    Rehab Potential  Good    PT Frequency  Other (comment)   1x/week, transition to 2x/week when COVID restrictions lifted   PT Duration  12 weeks    PT Treatment/Interventions  ADLs/Self Care Home Management;Electrical Stimulation;DME Instruction;Gait training;Stair training;Functional mobility training;Therapeutic activities;Therapeutic exercise;Balance training;Neuromuscular re-education;Patient/family education;Orthotic Fit/Training;Wheelchair mobility training    PT Next Visit Plan  CHECK VITALS EACH VISIT.  Review HEP and progress strengthening and  balance exercises; gait training with focus on foot  placement to avoid scissoring with gait    Consulted and Agree with Plan of Care  Patient       Patient will benefit from skilled therapeutic intervention in order to improve the following deficits and impairments:  Abnormal gait, Decreased balance, Decreased coordination, Decreased strength, Difficulty walking, Increased edema, Impaired UE functional use, Pain  Visit Diagnosis: Other abnormalities of gait and mobility  Unsteadiness on feet     Problem List Patient Active Problem List   Diagnosis Date Noted  . Acute on chronic anemia   . Pain   . Agitation 07/26/2018  . Hepatitis C 07/26/2018  . Nontraumatic thalamic hemorrhage ([place]) 07/26/2018  . Osteoarthritis of left hip 04/06/2018  . Cameron ulcer, chronic 01/11/2018  . Symptomatic anemia 01/10/2018  . Syncope 01/10/2018  . Hiatal hernia with gastroesophageal reflux disease and esophagitis 01/05/2018  . Insomnia 01/05/2018  . Rotator cuff syndrome of left shoulder 11/21/2017  . Anemia, iron deficiency   . Gastrointestinal bleeding 10/24/2015  . Tobacco abuse   . Acute ischemic stroke ([place])   . Stroke ([place]) 05/31/2015  . HTN (hypertension) 04/10/2014  . Inguinal hernia without mention of obstruction or gangrene, unilateral or unspecified, (not specified as recurrent)-right 01/30/2014    Jamarious Febo W. 10/27/2018, 3:26 PM Frazier Butt., PT  [facility] [address] [place], [place], [address] Phone: [phone]   Fax:  [phone]  Name: [name] MRN: [id number] Date of Birth: Apr 14, 1951

## 2018-10-27 NOTE — Patient Instructions (Signed)
Place towel on tabletop, slide towel forwards and backwards 10x then slide toewel side to side or in circles wih each hand 10 reps 2x day  1. Grip Strengthening (Resistive Putty) Perform with both hands    Squeeze putty using thumb and all fingers. Repeat _20___ times. Do __2__ sessions per day.   2. Roll putty into tube on table and pinch between each finger and thumb x 10 reps each. (can do ring and small finger together)     Copyright  VHI. All rights reserved.      Coordination Activities  Perform the following activities for 20 minutes 1 times per day with both hand(s).   Rotate ball in fingertips (clockwise and counter-clockwise).  Flip cards 1 at a time as fast as you can.  Deal cards with your thumb (Hold deck in hand and push card off top with thumb).  Pick up chess pieces , coins, buttons, marbles, dried beans/pasta of different sizes and place in container.  Practice writing and/or typing.  Marland Kitchen

## 2018-10-27 NOTE — Therapy (Signed)
[facility] [address] [place], [place], [address] Phone: [phone]   Fax:  [phone]  Occupational Therapy Treatment  Patient Details  Name: [name] MRN: [id number] Date of Birth: August 07, 1950 Referring Provider (OT): Donetta Potts   Encounter Date: 10/27/2018  OT End of Session - 10/27/18 1202    Visit Number  2    Number of Visits  20    Date for OT Re-Evaluation  01/17/19    Authorization Type  [employer] MCR    OT Start Time  1152    OT Stop Time  1230    OT Time Calculation (min)  38 min       Past Medical History:  Diagnosis Date  . Arthritis   . Cameron ulcer, chronic 01/11/2018  . Chronic back pain   . GERD (gastroesophageal reflux disease)    uses Baking Soda  . GIB (gastrointestinal bleeding) 2012  . Glaucoma    right eye  . Headache    occasionally  . Hepatitis C   . Hiatal hernia with gastroesophageal reflux disease and esophagitis 01/05/2018  . History of blood transfusion    no abnormal reaction noted  . History of gout    doesn't take any meds  . Hyperlipidemia    not on any meds  . Hypertension    takes Amlodipine and Lisinopril daily  . Insomnia    takes Ambien nightly  . Ischemic colitis ([place]) 2012  . Joint pain   . Nocturia   . Numbness    both legs occasionally  . PONV (postoperative nausea and vomiting)   . Prostate cancer ([place])   . Shortness of breath dyspnea    rarely but when notices he can be lying/sitting/exertion.Dr.Hochrein is aware per pt  . Stroke ([place]) 2016   . Urinary frequency   . Urinary urgency     Past Surgical History:  Procedure Laterality Date  . APPENDECTOMY    . BIOPSY  01/11/2018   Procedure: BIOPSY;  Surgeon: Gatha Mayer, MD;  Location: [facility]OSCOPY;  Service: Endoscopy;;  . COLONOSCOPY    . COLONOSCOPY N/A 10/26/2015   Procedure: COLONOSCOPY;  Surgeon: Doran Stabler, MD;  Location: [facility] ENDOSCOPY;  Service: Endoscopy;  Laterality:  N/A;  . ESOPHAGOGASTRODUODENOSCOPY    . ESOPHAGOGASTRODUODENOSCOPY N/A 10/26/2015   Procedure: ESOPHAGOGASTRODUODENOSCOPY (EGD);  Surgeon: Doran Stabler, MD;  Location: [facility] ENDOSCOPY;  Service: Endoscopy;  Laterality: N/A;  . ESOPHAGOGASTRODUODENOSCOPY (EGD) WITH PROPOFOL N/A 01/11/2018   Procedure: ESOPHAGOGASTRODUODENOSCOPY (EGD) WITH PROPOFOL;  Surgeon: Gatha Mayer, MD;  Location: [facility] ENDOSCOPY;  Service: Endoscopy;  Laterality: N/A;  . INGUINAL HERNIA REPAIR Right 11/04/2014   Procedure: RIGHT INGUINAL HERNIA REPAIR WITH MESH;  Surgeon: Jackolyn Confer, MD;  Location: [facility];  Service: General;  Laterality: Right;  . MASS EXCISION N/A 11/04/2014   Procedure: REMOVAL OF RIGHT GROIN SOFT TISSUE MASS;  Surgeon: Jackolyn Confer, MD;  Location: [facility];  Service: General;  Laterality: N/A;  . Multiple abdominal surgeries     total of 13  . PROSTATECTOMY    . SMALL INTESTINE SURGERY      There were no vitals filed for this visit.  Subjective Assessment - 10/27/18 1203    Pertinent History  CVA w/ Rt hemiparesis 07/20/18. PMH: CVA w/ residual Lt hemiparesis (approx 2 years ago), mutiple lacunar infarcts, Hep C, HTN, polysubstance abuse    Limitations  fall risk, no driving    Currently in  Pain?  Yes    Pain Score  4     Pain Location  Shoulder    Pain Orientation  Left    Pain Descriptors / Indicators  Aching;Sore    Pain Type  Chronic pain    Pain Onset  More than a month ago    Pain Frequency  Constant    Aggravating Factors   movements    Pain Relieving Factors  unknown    Multiple Pain Sites  No          Treatment: Pt was instructed in HEP, see pt instructions. Pt was verbally instructed in coordination activities, pt verbalized understanding. Supine closed chain shoulder flexion and chest press, mod facillitation.                  OT Short Term Goals - 10/18/18 1301      OT SHORT TERM GOAL #1   Title  Independent with HEP for Rt hand coordination and Lt  shoulder    Time  4    Period  Weeks    Status  New      OT SHORT TERM GOAL #2   Title  Pt to verbalize understanding with memory strategies for medication management, appointments, and for cooking    Time  4    Period  Weeks    Status  New      OT SHORT TERM GOAL #3   Title  Pt to report pain less than or equal to 5/10 Lt shoulder in supine ROM    Time  4    Period  Weeks    Status  New      OT SHORT TERM GOAL #4   Title  Pt to verbalize understanding with proper positioning of LUE in bed and w/ low range reaching    Time  4    Period  Weeks    Status  New      OT SHORT TERM GOAL #5   Title  Pt to improve coordination Rt hand as evidenced by performing 9 hole peg test in 45 sec. or less    Baseline  52.03 sec    Time  4    Period  Weeks    Status  New        OT Long Term Goals - 10/18/18 1305      OT LONG TERM GOAL #1   Title  Independent with updated HEP for Lt shoulder - 01/17/19    Time  12    Period  Weeks    Status  New      OT LONG TERM GOAL #2   Title  Pt to perform LUE reaching for light objects to 75* or higher with pain less than or equal to 5/10    Baseline  approx 50* w/ pain 10/10    Time  12    Period  Weeks    Status  New      OT LONG TERM GOAL #3   Title  Pt to improve coordination RT dominant hand as evidenced by performing 9 hole peg test in 35 sec. or less    Baseline  52.08 sec.     Time  12    Period  Weeks    Status  New      OT LONG TERM GOAL #4   Title  Pt to demo cooking w/ DME prn demo good safety in balance and cognition at distant supervision level  Time  12    Period  Weeks    Status  New      OT LONG TERM GOAL #5   Title  Pt to write sentence at 90% legibility or higher    Time  12    Period  Weeks    Status  New            Plan - 10/27/18 1511    Clinical Impression Statement  Pt is progressing slowly towards goals. He remains limited by bilateral pain and stiffness.    Occupational performance deficits  (Please refer to evaluation for details):  IADL's;Social Participation;Rest and Sleep    Body Structure / Function / Physical Skills  ADL;Coordination;Endurance;UE functional use;Decreased knowledge of precautions;Balance;Body mechanics;Decreased knowledge of use of DME;IADL;FMC;Dexterity;Strength;ROM;Tone;Mobility    Restaurant manager, fast food;Safety Awareness;Memory;Problem Solve    Rehab Potential  Good    OT Frequency  1x / week    OT Duration  4 weeks    OT Treatment/Interventions  Self-care/ADL training;Therapeutic exercise;Visual/perceptual remediation/compensation;Moist Heat;Neuromuscular education;Patient/family education;Splinting;Therapist, nutritional;Therapeutic activities;DME and/or AE instruction;Manual Therapy;Passive range of motion;Cognitive remediation/compensation    Plan  continue to address UE ROM and functional use, update frequency with MD prn    Consulted and Agree with Plan of Care  Patient       Patient will benefit from skilled therapeutic intervention in order to improve the following deficits and impairments:  Body Structure / Function / Physical Skills, Cognitive Skills  Visit Diagnosis: Hemiplegia and hemiparesis following cerebral infarction affecting right dominant side (HCC)  Other lack of coordination  Other symptoms and signs involving cognitive functions following cerebral infarction  Acute pain of left shoulder  Stiffness of left shoulder, not elsewhere classified    Problem List Patient Active Problem List   Diagnosis Date Noted  . Acute on chronic anemia   . Pain   . Agitation 07/26/2018  . Hepatitis C 07/26/2018  . Nontraumatic thalamic hemorrhage ([place]) 07/26/2018  . Osteoarthritis of left hip 04/06/2018  . Cameron ulcer, chronic 01/11/2018  . Symptomatic anemia 01/10/2018  . Syncope 01/10/2018  . Hiatal hernia with gastroesophageal reflux disease and esophagitis 01/05/2018  . Insomnia 01/05/2018  . Rotator cuff syndrome of  left shoulder 11/21/2017  . Anemia, iron deficiency   . Gastrointestinal bleeding 10/24/2015  . Tobacco abuse   . Acute ischemic stroke ([place])   . Stroke ([place]) 05/31/2015  . HTN (hypertension) 04/10/2014  . Inguinal hernia without mention of obstruction or gangrene, unilateral or unspecified, (not specified as recurrent)-right 01/30/2014    , 10/27/2018, 3:13 PM  [facility] [address] [place], [place], [address] Phone: [phone]   Fax:  [phone]  Name: [name] MRN: [id number] Date of Birth: 05/29/1951

## 2018-10-27 NOTE — Patient Instructions (Signed)
Access Code: [email or web address]  URL: [email or web address]  Date: 10/27/2018  Prepared by: Mady Haagensen   Exercises  Seated Hamstring Curl with Anchored Resistance - 10 reps - 2 sets - 1x daily - 6x weekly  Seated Ankle Dorsiflexion with Resistance - 10 reps - 2 sets - 1x daily - 6x weekly  Sit to Stand - 5 reps - 2 sets - 1x daily - 6x weekly

## 2018-10-31 ENCOUNTER — Encounter: Payer: Self-pay | Admitting: Physical Medicine & Rehabilitation

## 2018-10-31 ENCOUNTER — Other Ambulatory Visit: Payer: Self-pay

## 2018-10-31 ENCOUNTER — Encounter: Payer: Medicare Other | Attending: Physical Medicine & Rehabilitation | Admitting: Physical Medicine & Rehabilitation

## 2018-10-31 DIAGNOSIS — K219 Gastro-esophageal reflux disease without esophagitis: Secondary | ICD-10-CM | POA: Insufficient documentation

## 2018-10-31 DIAGNOSIS — Z8546 Personal history of malignant neoplasm of prostate: Secondary | ICD-10-CM | POA: Insufficient documentation

## 2018-10-31 DIAGNOSIS — F17210 Nicotine dependence, cigarettes, uncomplicated: Secondary | ICD-10-CM | POA: Insufficient documentation

## 2018-10-31 DIAGNOSIS — M25561 Pain in right knee: Secondary | ICD-10-CM | POA: Insufficient documentation

## 2018-10-31 DIAGNOSIS — M25512 Pain in left shoulder: Secondary | ICD-10-CM | POA: Diagnosis not present

## 2018-10-31 DIAGNOSIS — F5101 Primary insomnia: Secondary | ICD-10-CM

## 2018-10-31 DIAGNOSIS — M109 Gout, unspecified: Secondary | ICD-10-CM | POA: Insufficient documentation

## 2018-10-31 DIAGNOSIS — G8929 Other chronic pain: Secondary | ICD-10-CM | POA: Insufficient documentation

## 2018-10-31 DIAGNOSIS — D62 Acute posthemorrhagic anemia: Secondary | ICD-10-CM | POA: Insufficient documentation

## 2018-10-31 DIAGNOSIS — R531 Weakness: Secondary | ICD-10-CM | POA: Insufficient documentation

## 2018-10-31 DIAGNOSIS — M549 Dorsalgia, unspecified: Secondary | ICD-10-CM | POA: Insufficient documentation

## 2018-10-31 DIAGNOSIS — R4189 Other symptoms and signs involving cognitive functions and awareness: Secondary | ICD-10-CM | POA: Insufficient documentation

## 2018-10-31 DIAGNOSIS — I619 Nontraumatic intracerebral hemorrhage, unspecified: Secondary | ICD-10-CM | POA: Diagnosis not present

## 2018-10-31 DIAGNOSIS — H409 Unspecified glaucoma: Secondary | ICD-10-CM | POA: Insufficient documentation

## 2018-10-31 DIAGNOSIS — E785 Hyperlipidemia, unspecified: Secondary | ICD-10-CM | POA: Insufficient documentation

## 2018-10-31 DIAGNOSIS — B1920 Unspecified viral hepatitis C without hepatic coma: Secondary | ICD-10-CM | POA: Insufficient documentation

## 2018-10-31 DIAGNOSIS — G4700 Insomnia, unspecified: Secondary | ICD-10-CM | POA: Insufficient documentation

## 2018-10-31 DIAGNOSIS — R262 Difficulty in walking, not elsewhere classified: Secondary | ICD-10-CM | POA: Insufficient documentation

## 2018-10-31 DIAGNOSIS — Z8249 Family history of ischemic heart disease and other diseases of the circulatory system: Secondary | ICD-10-CM | POA: Insufficient documentation

## 2018-10-31 DIAGNOSIS — I69251 Hemiplegia and hemiparesis following other nontraumatic intracranial hemorrhage affecting right dominant side: Secondary | ICD-10-CM | POA: Insufficient documentation

## 2018-10-31 DIAGNOSIS — M1990 Unspecified osteoarthritis, unspecified site: Secondary | ICD-10-CM | POA: Insufficient documentation

## 2018-10-31 DIAGNOSIS — I10 Essential (primary) hypertension: Secondary | ICD-10-CM | POA: Diagnosis not present

## 2018-10-31 DIAGNOSIS — R51 Headache: Secondary | ICD-10-CM | POA: Insufficient documentation

## 2018-10-31 MED ORDER — QUETIAPINE FUMARATE 50 MG PO TABS: 75.0000 mg | ORAL_TABLET | Freq: Every day | ORAL | 2 refills | Status: DC

## 2018-10-31 NOTE — Progress Notes (Signed)
Subjective:    Patient ID: [name], male    DOB: February 12, 1951, 68 y.o.   MRN: [id number]  HPI  VIRTUAL VISIT--pt is at home, MD at office.  This is a follow-up visit via telephone call for [name].  I last saw him in February. He has been exercising more such as stretching and strengthening although he doesn't do them routinely. He was accepted in to outpt therapies and has been going to [facility] since 4/1  He is bathing himself and doing some basic cooking at home. He feels that his mobility has improved.   Sleep is fair but he still is not sleeping soundly through the night  His shoulder is an ongoing problem. He struggles with ROM and functional activities. The shoulder injection I gave him in the hospital only provided modest relief. His primary has been filling tramadol. He uses flexeril still for pain.   Pain Inventory Average Pain 9 Pain Right Now 0 My pain is aching and nagging pain in left shoulder  In the last 24 hours, has pain interfered with the following? General activity 2 Relation with others 2 Enjoyment of life 2 What TIME of day is your pain at its worst? all Sleep (in general) Poor  Pain is worse with: sitting, some activites and using left shoulder Pain improves with: heat/ice and medication Relief from Meds: 1  Mobility use a walker  Function I need assistance with the following:  meal prep, household duties and shopping  Neuro/Psych No problems in this area  Prior Studies Any changes since last visit?  no  Physicians involved in your care Any changes since last visit?  no   Family History  Problem Relation Age of Onset  . Alzheimer's disease Father   . Cancer Mother        Lymph node  . Heart failure Brother 45       Transplant 13 years ago  . CAD Brother   . Heart disease Sister 25       MI   Social History   Socioeconomic History  . Marital status: Legally Separated    Spouse name: Not on file  .  Number of children: 2  . Years of education: Not on file  . Highest education level: Not on file  Occupational History  . Not on file  Social Needs  . Financial resource strain: Not on file  . Food insecurity:    Worry: Not on file    Inability: Not on file  . Transportation needs:    Medical: Not on file    Non-medical: Not on file  Tobacco Use  . Smoking status: Current Every Day Smoker    Packs/day: 0.50    Years: 50.00    Pack years: 25.00    Types: Cigarettes  . Smokeless tobacco: Never Used  Substance and Sexual Activity  . Alcohol use: Yes    Alcohol/week: 0.0 standard drinks    Comment: occasionally  . Drug use: No    Frequency: 5.0 times per week  . Sexual activity: Yes  Lifestyle  . Physical activity:    Days per week: Not on file    Minutes per session: Not on file  . Stress: Not on file  Relationships  . Social connections:    Talks on phone: Not on file    Gets together: Not on file    Attends religious service: Not on file    Active member of club or  organization: Not on file    Attends meetings of clubs or organizations: Not on file    Relationship status: Not on file  Other Topics Concern  . Not on file  Social History Narrative   One living child .  One murdered.  Lives with girlfriend.     Past Surgical History:  Procedure Laterality Date  . APPENDECTOMY    . BIOPSY  01/11/2018   Procedure: BIOPSY;  Surgeon: Gatha Mayer, MD;  Location: [facility];  Service: Endoscopy;;  . COLONOSCOPY    . COLONOSCOPY N/A 10/26/2015   Procedure: COLONOSCOPY;  Surgeon: Doran Stabler, MD;  Location[facility] END[facility]COPY;  Service: Endoscopy;  Laterality: N/A;  . ESOPHAGOGASTRODUODENOSCOPY    . ESOPHAGOGASTRODUODENOSCOPY N/A 10/26/2015   Procedure: ESOPHAGOGASTRODUODENOSCOPY (EGD);  Surgeon: Doran Stabler, MD;  Location: [facility] ENDOSCOPY;  Service: Endoscopy;  Laterality: N/A;  . ESOPHAGOGASTRODUODENOSCOPY (EGD) WITH PROPOFOL N/A 01/11/2018   Procedure:  ESOPHAGOGASTRODUODENOSCOPY (EGD) WITH PROPOFOL;  Surgeon: Gatha Mayer, MD;  Location: [facility];  Service: Endoscopy;  Laterality: N/A;  . INGUINAL HERNIA REPAIR Right 11/04/2014   Procedure: RIGHT INGUINAL HERNIA REPAIR WITH MESH;  Surgeon: Jackolyn Confer, MD;  Location: [facility];  Service: General;  Laterality: Right;  . MASS EXCISION N/A 11/04/2014   Procedure: REMOVAL OF RIGHT GROIN SOFT TISSUE MASS;  Surgeon: Jackolyn Confer, MD;  Location: [place];  Service: General;  Laterality: N/A;  . Multiple abdominal surgeries     total of 13  . PROSTATECTOMY    . SMALL INTESTINE SURGERY     Past Medical History:  Diagnosis Date  . Arthritis   . Cameron ulcer, chronic 01/11/2018  . Chronic back pain   . GERD (gastroesophageal reflux disease)    uses Baking Soda  . GIB (gastrointestinal bleeding) 2012  . Glaucoma    right eye  . Headache    occasionally  . Hepatitis C   . Hiatal hernia with gastroesophageal reflux disease and esophagitis 01/05/2018  . History of blood transfusion    no abnormal reaction noted  . History of gout    doesn't take any meds  . Hyperlipidemia    not on any meds  . Hypertension    takes Amlodipine and Lisinopril daily  . Insomnia    takes Ambien nightly  . Ischemic colitis ([place]) 2012  . Joint pain   . Nocturia   . Numbness    both legs occasionally  . PONV (postoperative nausea and vomiting)   . Prostate cancer ([place])   . Shortness of breath dyspnea    rarely but when notices he can be lying/sitting/exertion.Dr.Hochrein is aware per pt  . Stroke ([place]) 2016   . Urinary frequency   . Urinary urgency    There were no vitals taken for this visit.  Opioid Risk Score:   Fall Risk Score:  `1  Depression screen PHQ 2/9  Depression screen [facility] 2/9 10/31/2018 10/18/2017 08/29/2017 07/11/2017 06/23/2017 05/27/2016 12/08/2015  Decreased Interest 0 0 1 0 0 0 0  Down, Depressed, Hopeless 0 0 1 0 0 0 0  PHQ - 2 Score 0 0 2 0 0 0 0  Altered sleeping - - 3 - - - 0   Tired, decreased energy - - 2 - - - 0  Change in appetite - - 3 - - - 0  Feeling bad or failure about yourself  - - 1 - - - 0  Trouble concentrating - - 3 - - -  0  Moving slowly or fidgety/restless - - 3 - - - 0  Suicidal thoughts - - 0 - - - 0  PHQ-9 Score - - 17 - - - 0  Difficult doing work/chores - - - - - - Not difficult at all  Some recent data might be hidden   Review of Systems  Constitutional: Negative.   HENT: Negative.   Eyes: Negative.   Respiratory: Negative.   Cardiovascular: Negative.   Gastrointestinal: Negative.   Endocrine: Negative.   Genitourinary: Negative.   Musculoskeletal: Positive for arthralgias and myalgias.       Left shoulder pain  Skin: Negative.   Allergic/Immunologic: Negative.   Neurological: Negative.   Hematological: Negative.   Psychiatric/Behavioral: Negative.   All other systems reviewed and are negative.           Assessment & Plan:  1. 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 --pt has initiated outpt therapies---shall continue with PT and OT             -reiterated need to perform HEP daily  -discussed using a calendar or schedule to help remind re: exercises 2.  History of chronic back pain/headaches/pain Management: Tylenol as needed for now -Patient status post left shoulder injection/subacromial. Some improvement in Pain but ROM still limited.  -made referral for MRI left shoulder to assess RTC             -continue prn flexeril 4. Mood: Wellbutrin  6 HTN:  Continue amlodipine and lisinopril--as well as hydralazine.  Blood pressure fairly well controlled at present  9. Acute on chronic blood loss anemia/HH with gastritis: per outpt GI/primary 10. Chronic Insomnia :  -on Seroquel and Ambien.    -increased seroquel to 75mg  qhs  -discussed sleep hygiene, tobacco effects 11. Tobacco dependence:  - continue Wellbutrin  100 mg twice daily   15 minutes of telephone time was spent. Follow up in 2 months

## 2018-11-01 ENCOUNTER — Ambulatory Visit: Payer: Medicare Other | Admitting: Occupational Therapy

## 2018-11-01 ENCOUNTER — Ambulatory Visit: Payer: Medicare Other

## 2018-11-01 ENCOUNTER — Ambulatory Visit: Payer: Medicare Other | Admitting: Physical Therapy

## 2018-11-03 ENCOUNTER — Ambulatory Visit: Payer: Medicare Other | Admitting: Occupational Therapy

## 2018-11-03 ENCOUNTER — Ambulatory Visit: Payer: Medicare Other | Admitting: Physical Therapy

## 2018-11-06 ENCOUNTER — Other Ambulatory Visit: Payer: Self-pay | Admitting: Family Medicine

## 2018-12-26 ENCOUNTER — Ambulatory Visit: Payer: Medicare Other | Admitting: Registered Nurse

## 2018-12-26 ENCOUNTER — Encounter: Payer: Medicare Other | Admitting: Physical Medicine & Rehabilitation

## 2018-12-27 ENCOUNTER — Telehealth: Payer: Self-pay | Admitting: Family Medicine

## 2018-12-27 DIAGNOSIS — F5101 Primary insomnia: Secondary | ICD-10-CM

## 2018-12-27 NOTE — Telephone Encounter (Signed)
Copied from [place] [id number]. Topic: Quick Communication - Rx Refill/Question >> Dec 27, 2018 12:15 PM Burchel, Abbi R wrote: Medication: cloNIDine (CATAPRES) tablet 0.1 mg, QUEtiapine (SEROQUEL) 50 MG tablet., lisinopril (PRINIVIL,ZESTRIL) 40 MG tablet, traMADol (ULTRAM) 50 MG tablet, zolpidem (AMBIEN) 5 MG tablet, amLODipine (NORVASC) 10 MG tablet  Preferred Pharmacy: [facility], [place]  [phone] (Phone) [phone] (Fax)    Pt was advised that RX refills may take up to 3 business days. We ask that you follow-up with your pharmacy.

## 2018-12-29 ENCOUNTER — Other Ambulatory Visit: Payer: Self-pay

## 2018-12-29 DIAGNOSIS — I10 Essential (primary) hypertension: Secondary | ICD-10-CM

## 2018-12-29 MED ORDER — QUETIAPINE FUMARATE 50 MG PO TABS: 75.0000 mg | ORAL_TABLET | Freq: Every day | ORAL | 2 refills | Status: DC

## 2018-12-29 MED ORDER — ZOLPIDEM TARTRATE 5 MG PO TABS: 5.0000 mg | ORAL_TABLET | Freq: Every day | ORAL | 2 refills | Status: DC

## 2018-12-29 MED ORDER — LISINOPRIL 40 MG PO TABS: 40.0000 mg | ORAL_TABLET | Freq: Every day | ORAL | 2 refills | Status: DC

## 2018-12-29 MED ORDER — AMLODIPINE BESYLATE 10 MG PO TABS: 10.0000 mg | ORAL_TABLET | Freq: Every day | ORAL | 2 refills | Status: DC

## 2018-12-29 NOTE — Telephone Encounter (Signed)
Rx for Lisinopril and amlodipine have been sent to pt pharmacy. Dr Volanda Napoleon did not refill Tramadol since pt is on Flexeril. Rx for Ambien and Seroquel was sent to Dr Naaman Plummer pt rehab dr for refill.

## 2018-12-29 NOTE — Telephone Encounter (Signed)
seroquel and ambien RF'ed

## 2019-01-01 ENCOUNTER — Inpatient Hospital Stay: Admission: RE | Admit: 2019-01-01 | Payer: Medicare Other | Source: Ambulatory Visit

## 2019-01-02 ENCOUNTER — Encounter: Payer: Medicare Other | Attending: Physical Medicine & Rehabilitation | Admitting: Registered Nurse

## 2019-01-02 DIAGNOSIS — I10 Essential (primary) hypertension: Secondary | ICD-10-CM | POA: Insufficient documentation

## 2019-01-02 DIAGNOSIS — M549 Dorsalgia, unspecified: Secondary | ICD-10-CM | POA: Insufficient documentation

## 2019-01-02 DIAGNOSIS — G8929 Other chronic pain: Secondary | ICD-10-CM | POA: Insufficient documentation

## 2019-01-02 DIAGNOSIS — I619 Nontraumatic intracerebral hemorrhage, unspecified: Secondary | ICD-10-CM | POA: Insufficient documentation

## 2019-01-02 DIAGNOSIS — Z8249 Family history of ischemic heart disease and other diseases of the circulatory system: Secondary | ICD-10-CM | POA: Insufficient documentation

## 2019-01-02 DIAGNOSIS — R51 Headache: Secondary | ICD-10-CM | POA: Insufficient documentation

## 2019-01-02 DIAGNOSIS — D62 Acute posthemorrhagic anemia: Secondary | ICD-10-CM | POA: Insufficient documentation

## 2019-01-02 DIAGNOSIS — B1920 Unspecified viral hepatitis C without hepatic coma: Secondary | ICD-10-CM | POA: Insufficient documentation

## 2019-01-02 DIAGNOSIS — H409 Unspecified glaucoma: Secondary | ICD-10-CM | POA: Insufficient documentation

## 2019-01-02 DIAGNOSIS — M109 Gout, unspecified: Secondary | ICD-10-CM | POA: Insufficient documentation

## 2019-01-02 DIAGNOSIS — M25561 Pain in right knee: Secondary | ICD-10-CM | POA: Insufficient documentation

## 2019-01-02 DIAGNOSIS — R531 Weakness: Secondary | ICD-10-CM | POA: Insufficient documentation

## 2019-01-02 DIAGNOSIS — E785 Hyperlipidemia, unspecified: Secondary | ICD-10-CM | POA: Insufficient documentation

## 2019-01-02 DIAGNOSIS — R4189 Other symptoms and signs involving cognitive functions and awareness: Secondary | ICD-10-CM | POA: Insufficient documentation

## 2019-01-02 DIAGNOSIS — K219 Gastro-esophageal reflux disease without esophagitis: Secondary | ICD-10-CM | POA: Insufficient documentation

## 2019-01-02 DIAGNOSIS — F17210 Nicotine dependence, cigarettes, uncomplicated: Secondary | ICD-10-CM | POA: Insufficient documentation

## 2019-01-02 DIAGNOSIS — M1990 Unspecified osteoarthritis, unspecified site: Secondary | ICD-10-CM | POA: Insufficient documentation

## 2019-01-02 DIAGNOSIS — I69251 Hemiplegia and hemiparesis following other nontraumatic intracranial hemorrhage affecting right dominant side: Secondary | ICD-10-CM | POA: Insufficient documentation

## 2019-01-02 DIAGNOSIS — R262 Difficulty in walking, not elsewhere classified: Secondary | ICD-10-CM | POA: Insufficient documentation

## 2019-01-02 DIAGNOSIS — Z8546 Personal history of malignant neoplasm of prostate: Secondary | ICD-10-CM | POA: Insufficient documentation

## 2019-01-02 DIAGNOSIS — G4700 Insomnia, unspecified: Secondary | ICD-10-CM | POA: Insufficient documentation

## 2019-01-09 ENCOUNTER — Other Ambulatory Visit: Payer: Self-pay | Admitting: Family Medicine

## 2019-01-09 ENCOUNTER — Encounter: Payer: Medicare Other | Admitting: Registered Nurse

## 2019-01-09 DIAGNOSIS — I619 Nontraumatic intracerebral hemorrhage, unspecified: Secondary | ICD-10-CM

## 2019-01-09 DIAGNOSIS — I10 Essential (primary) hypertension: Secondary | ICD-10-CM

## 2019-01-10 NOTE — Telephone Encounter (Signed)
Pt requests refill, please advise

## 2019-01-23 ENCOUNTER — Ambulatory Visit: Payer: Medicare Other

## 2019-01-23 ENCOUNTER — Ambulatory Visit: Payer: Medicare Other | Admitting: Occupational Therapy

## 2019-01-25 ENCOUNTER — Ambulatory Visit: Payer: Medicare Other | Admitting: Physical Therapy

## 2019-01-25 ENCOUNTER — Ambulatory Visit: Payer: Medicare Other

## 2019-01-26 ENCOUNTER — Ambulatory Visit: Payer: Medicare Other | Admitting: Occupational Therapy

## 2019-01-26 ENCOUNTER — Ambulatory Visit: Payer: Medicare Other | Admitting: Physical Therapy

## 2019-01-31 ENCOUNTER — Encounter: Payer: Self-pay | Admitting: Physical Therapy

## 2019-01-31 ENCOUNTER — Ambulatory Visit: Payer: Medicare Other | Admitting: Physical Therapy

## 2019-01-31 ENCOUNTER — Telehealth: Payer: Self-pay | Admitting: Physical Therapy

## 2019-01-31 ENCOUNTER — Encounter: Payer: Self-pay | Admitting: Speech Pathology

## 2019-01-31 ENCOUNTER — Other Ambulatory Visit: Payer: Self-pay

## 2019-01-31 ENCOUNTER — Ambulatory Visit: Payer: Medicare Other | Attending: Physical Medicine & Rehabilitation | Admitting: Speech Pathology

## 2019-01-31 VITALS — BP 140/80

## 2019-01-31 DIAGNOSIS — I69351 Hemiplegia and hemiparesis following cerebral infarction affecting right dominant side: Secondary | ICD-10-CM | POA: Diagnosis present

## 2019-01-31 DIAGNOSIS — R471 Dysarthria and anarthria: Secondary | ICD-10-CM | POA: Insufficient documentation

## 2019-01-31 DIAGNOSIS — R41841 Cognitive communication deficit: Secondary | ICD-10-CM | POA: Diagnosis not present

## 2019-01-31 DIAGNOSIS — R2681 Unsteadiness on feet: Secondary | ICD-10-CM | POA: Diagnosis present

## 2019-01-31 DIAGNOSIS — R2689 Other abnormalities of gait and mobility: Secondary | ICD-10-CM | POA: Insufficient documentation

## 2019-01-31 DIAGNOSIS — R278 Other lack of coordination: Secondary | ICD-10-CM | POA: Diagnosis present

## 2019-01-31 NOTE — Telephone Encounter (Signed)
Hello Dr. Volanda Napoleon, Alanda Slim has returned to outpatient PT and OT.  He has expressed interest in participating in aquatic therapy to learn appropriate exercises for him to perform on his own at the [facility] once gyms have re-opened.  Due to his cardiac and neurological history do you feel there would be any contraindications to him performing exercises submerged in 4-34ft of water?  Thank you, Rico Junker, PT, DPT 01/31/19    5:11 PM

## 2019-01-31 NOTE — Patient Instructions (Addendum)
  Call and get your prescription that is out refilled  Have someone help you set up your pill organizer at the beginning of the week  Bring in your Advance binder - we will use this as a memory binder   Cognitive Activities you can do at home:   - Solitaire  - [place]  - Chess/Checkers  - Crosswords (easy level)  - [place]  - jigsaw  On your computer, tablet or phone:  - Memory games

## 2019-01-31 NOTE — Therapy (Signed)
[facility] [address] [place], [place], [address] Phone: [phone]   Fax:  [phone]  Speech Language Pathology Treatment  Patient Details  Name: [name] MRN: [id number] Date of Birth: April 28, 1951 Referring Provider (SLP): Alger Simons MD   Encounter Date: 01/31/2019  End of Session - 01/31/19 1239    Visit Number  2    Number of Visits  20    Date for SLP Re-Evaluation  04/11/19    Authorization Type  I extended re-eval date as pt was on hold due to Covid, since 10/2018    SLP Start Time  1203    SLP Stop Time   1250    SLP Time Calculation (min)  47 min       Past Medical History:  Diagnosis Date  . Arthritis   . Cameron ulcer, chronic 01/11/2018  . Chronic back pain   . GERD (gastroesophageal reflux disease)    uses Baking Soda  . GIB (gastrointestinal bleeding) 2012  . Glaucoma    right eye  . Headache    occasionally  . Hepatitis C   . Hiatal hernia with gastroesophageal reflux disease and esophagitis 01/05/2018  . History of blood transfusion    no abnormal reaction noted  . History of gout    doesn't take any meds  . Hyperlipidemia    not on any meds  . Hypertension    takes Amlodipine and Lisinopril daily  . Insomnia    takes Ambien nightly  . Ischemic colitis ([place]) 2012  . Joint pain   . Nocturia   . Numbness    both legs occasionally  . PONV (postoperative nausea and vomiting)   . Prostate cancer ([place])   . Shortness of breath dyspnea    rarely but when notices he can be lying/sitting/exertion.Dr.Hochrein is aware per pt  . Stroke ([place]) 2016   . Urinary frequency   . Urinary urgency     Past Surgical History:  Procedure Laterality Date  . APPENDECTOMY    . BIOPSY  01/11/2018   Procedure: BIOPSY;  Surgeon: Gatha Mayer, MD;  Location: [facility];  Service: Endoscopy;;  . COLONOSCOPY    . COLONOSCOPY N/A 10/26/2015   Procedure: COLONOSCOPY;  Surgeon: Doran Stabler, MD;  Location: [facility] ENDOSCOPY;  Service: Endoscopy;  Laterality: N/A;  . ESOPHAGOGASTRODUODENOSCOPY    . ESOPHAGOGASTRODUODENOSCOPY N/A 10/26/2015   Procedure: ESOPHAGOGASTRODUODENOSCOPY (EGD);  Surgeon: Doran Stabler, MD;  Location: [facility] ENDOSCOPY;  Service: Endoscopy;  Laterality: N/A;  . ESOPHAGOGASTRODUODENOSCOPY (EGD) WITH PROPOFOL N/A 01/11/2018   Procedure: ESOPHAGOGASTRODUODENOSCOPY (EGD) WITH PROPOFOL;  Surgeon: Gatha Mayer, MD;  [facility]: [facility];  Service: Endoscopy;  Laterality: N/A;  . INGUINAL HERNIA REPAIR Right 11/04/2014   Procedure: RIGHT INGUINAL HERNIA REPAIR WITH MESH;  Surgeon: Jackolyn Confer, MD;  Location: [place];  Service: General;  Laterality: Right;  . MASS EXCISION N/A 11/04/2014   Procedure: REMOVAL OF RIGHT GROIN SOFT TISSUE MASS;  Surgeon: Jackolyn Confer, MD;  Location: [place];  Service: General;  Laterality: N/A;  . Multiple abdominal surgeries     total of 13  . PROSTATECTOMY    . SMALL INTESTINE SURGERY      There were no vitals filed for this visit.  Subjective Assessment - 01/31/19 1104    Subjective  "I use a pill organizer"    Currently in Pain?  Yes    Pain Score  6  Pain Location  Leg    Pain Orientation  Right    Pain Descriptors / Indicators  Aching;Sore    Pain Type  Chronic pain    Pain Onset  More than a month ago    Pain Frequency  Constant    Aggravating Factors   movement    Pain Relieving Factors  pain meds dont' work            ADULT SLP TREATMENT - 01/31/19 1105      General Information   Behavior/Cognition  Alert;Cooperative;Pleasant mood      Treatment Provided   Treatment provided  Cognitive-Linquistic      Cognitive-Linquistic Treatment   Treatment focused on  Cognition    Skilled Treatment  Administered the Cognitive Linguistic Quick Test (CLQT). CLQT revealed moderate memory impariment, mild attention, executive function, language and visuospatial skills impairments. Pt with inconsistent  awareness of errors, self corrected inconsistently. After visual memory task, pt stated "I forgot it so fast!"  Pt verbalized memory impairment, however does not demonstrate emergent awareness. He stated he thought his appointments here were tomorrow and was surprised that he had to be here today. He also stated he is using a pill organizer, but is out of 1 of his meds. He has not called for refill as of yet. Instructed him to get this refilled today. We discussed importance of taking all of his meds as prescribed for stroke prevention.  Pt also verbalizing reduced safety awareness, walking without his walker on purpose because "I have to get stronger, so I walk a little without my walker" even when his room mate instructs him to use the walker. I instructed pt to cross off the days on his appointment calendar and bring in his 3 ring notebook  with the calendar in it for next session. Provided handout to remind him of this. Again, poor awareness noted as pt states he is cooking and he sometimes turns the burner on the hottest setting, burning food. Instructed pt to have someone in the kitchen with him if he elects to cook (I advised against this at this time)      Assessment / Recommendations / Plan   Plan  Goals updated   after CLQT administration     Progression Toward Goals   Progression toward goals  Progressing toward goals       SLP Education - 01/31/19 1219    Education Details  use calendar and bring in a binder for memory binder; cognitive activities to do at home.       SLP Short Term Goals - 01/31/19 1224      SLP SHORT TERM GOAL #1   Title  pt will utilize memory compensations in 3 sessions    Time  5    Period  Weeks    Status  On-going      SLP SHORT TERM GOAL #2   Title  pt will undergo full cognitive testing in the first two therapy sessions    Time  2    Period  Weeks    Status  Achieved      SLP SHORT TERM GOAL #3   Title  Pt will report carryover of 3 safety  strategies in the kitchen and when walking over 2 sessions    Time  5    Period  Weeks    Status  New      SLP SHORT TERM GOAL #4   Title  Pt will utilize compensations for  medication management for daily meds as well as having meds refilled before they run out over 2 sessions    Time  5    Period  Weeks    Status  New       SLP Long Term Goals - 01/31/19 1237      SLP LONG TERM GOAL #1   Title  pt will utilize memory compnesations when appropriate to do so, in 90% of opportunities over three sessions    Time  10    Period  Weeks   or 20 visits   Status  On-going      SLP LONG TERM GOAL #2   Title  Pt will utilize memory binder/external aids to manage his appointments, schedules, to do lists/grocery lists with mod I over 3 sessions    Time  10    Period  Weeks    Status  New      SLP LONG TERM GOAL #3   Title  Pt will utilize compensatory strategies for memory, attention and safety for high level ADL's with occasional min A over 2 sessions    Time  10    Period  Weeks    Status  New       Plan - 01/31/19 1221    Clinical Impression Statement  CLQT administered - see skilled intervention. Mild to moderate cognitive impairments affecting safety, judgement, decision making and awareness.  Medication management, schedule management, and safety decisions are impaired.Marland Kitchen He will benefit from skilled ST to address cognition and memory to improve independence and safety.    Speech Therapy Frequency  2x / week    Duration  --   10 weeks or 20 visits   Treatment/Interventions  SLP instruction and feedback;Patient/family education;Internal/external aids;Cognitive reorganization;Cueing hierarchy;Environmental controls;Compensatory techniques;Functional tasks    Potential to Achieve Goals  Fair    Potential Considerations  Family/community support;Ability to learn/carryover information       Patient will benefit from skilled therapeutic intervention in order to improve the following  deficits and impairments:   1. Cognitive communication deficit       Problem List Patient Active Problem List   Diagnosis Date Noted  . Acute on chronic anemia   . Pain   . Agitation 07/26/2018  . Hepatitis C 07/26/2018  . Nontraumatic thalamic hemorrhage ([place]) 07/26/2018  . Osteoarthritis of left hip 04/06/2018  . Cameron ulcer, chronic 01/11/2018  . Symptomatic anemia 01/10/2018  . Syncope 01/10/2018  . Hiatal hernia with gastroesophageal reflux disease and esophagitis 01/05/2018  . Insomnia 01/05/2018  . Rotator cuff syndrome of left shoulder 11/21/2017  . Anemia, iron deficiency   . Gastrointestinal bleeding 10/24/2015  . Tobacco abuse   . Acute ischemic stroke ([place])   . Stroke ([place]) 05/31/2015  . HTN (hypertension) 04/10/2014  . Inguinal hernia without mention of obstruction or gangrene, unilateral or unspecified, (not specified as recurrent)-right 01/30/2014    Mardell Cragg, Annye Rusk MS, CCC-SLP 01/31/2019, 12:41 PM  [facility] [address] [place], [place], [address] Phone: [phone]   Fax:  [phone]   Name: Torrence Hammack MRN: [id number] Date of Birth: 04/14/1951

## 2019-01-31 NOTE — Therapy (Signed)
[facility] [address] [place], [place], [address] Phone: [phone]   Fax:  [phone]  Physical Therapy Treatment  Patient Details  Name: [name] MRN: [id number] Date of Birth: 29-Jul-1950 Referring Provider (PT): Dr. Alger Simons    Encounter Date: 01/31/2019   CLINIC OPERATION CHANGES: Outpatient Neuro Rehab is open at lower capacity following universal masking, social distancing, and patient screening.  The patient's COVID risk of complications score is 4.   PT End of Session - 01/31/19 1243    Visit Number  3    Number of Visits  19    Date for PT Re-Evaluation  04/01/19    Authorization Type  [employer] Medicare: 10th visit PN    PT Start Time  1000    PT Stop Time  1045    PT Time Calculation (min)  45 min    Activity Tolerance  Patient tolerated treatment well    Behavior During Therapy  WFL for tasks assessed/performed       Past Medical History:  Diagnosis Date  . Arthritis   . Cameron ulcer, chronic 01/11/2018  . Chronic back pain   . GERD (gastroesophageal reflux disease)    uses Baking Soda  . GIB (gastrointestinal bleeding) 2012  . Glaucoma    right eye  . Headache    occasionally  . Hepatitis C   . Hiatal hernia with gastroesophageal reflux disease and esophagitis 01/05/2018  . History of blood transfusion    no abnormal reaction noted  . History of gout    doesn't take any meds  . Hyperlipidemia    not on any meds  . Hypertension    takes Amlodipine and Lisinopril daily  . Insomnia    takes Ambien nightly  . Ischemic colitis ([place]) 2012  . Joint pain   . Nocturia   . Numbness    both legs occasionally  . PONV (postoperative nausea and vomiting)   . Prostate cancer ([place])   . Shortness of breath dyspnea    rarely but when notices he can be lying/sitting/exertion.Dr.Hochrein is aware per pt  . Stroke ([place]) 2016   . Urinary frequency   . Urinary urgency     Past Surgical  History:  Procedure Laterality Date  . APPENDECTOMY    . BIOPSY  01/11/2018   Procedure: BIOPSY;  Surgeon: Gatha Mayer, MD;  Location: [facility] ENDOSCOPY;  Service: Endoscopy;;  . COLONOSCOPY    . COLONOSCOPY N/A 10/26/2015   Procedure: COLONOSCOPY;  Surgeon: Doran Stabler, MD;  Location: [facility] ENDOSCOPY;  Service: Endoscopy;  Laterality: N/A;  . ESOPHAGOGASTRODUODENOSCOPY    . ESOPHAGOGASTRODUODENOSCOPY N/A 10/26/2015   Procedure: ESOPHAGOGASTRODUODENOSCOPY (EGD);  Surgeon: Doran Stabler, MD;  Location: [facility] ENDOSCOPY;  Service: Endoscopy;  Laterality: N/A;  . ESOPHAGOGASTRODUODENOSCOPY (EGD) WITH PROPOFOL N/A 01/11/2018   Procedure: ESOPHAGOGASTRODUODENOSCOPY (EGD) WITH PROPOFOL;  Surgeon: Gatha Mayer, MD;  Location: [facility] ENDOSCOPY;  Service: Endoscopy;  Laterality: N/A;  . INGUINAL HERNIA REPAIR Right 11/04/2014   Procedure: RIGHT INGUINAL HERNIA REPAIR WITH MESH;  Surgeon: Jackolyn Confer, MD;  Location: [facility];  Service: General;  Laterality: Right;  . MASS EXCISION N/A 11/04/2014   Procedure: REMOVAL OF RIGHT GROIN SOFT TISSUE MASS;  Surgeon: Jackolyn Confer, MD;  Location: [facility];  Service: General;  Laterality: N/A;  . Multiple abdominal surgeries     total of 13  . PROSTATECTOMY    . SMALL INTESTINE SURGERY  Vitals:   01/31/19 1006  BP: 140/80    Subjective Assessment - 01/31/19 1012    Subjective  Pt returns after being on hold due to quarantine.  Pt arrives in w/c but reports he has been doing some walking without his RW.  He and fiance broke up but daugther's friend is there temporarily to assist.  No falls.  LE have been swelling, R > L.  Pt is interested in aquatic.  Pt is privately purchasing a power wheelchair to allow him to go out in the community more independently.    Patient Stated Goals  To get out of the w/c and be more independent    Currently in Pain?  No/denies         [facility] PT Assessment - 01/31/19 1013      Assessment   Medical Diagnosis  L thalamic CVA     Referring Provider (PT)  Dr. Alger Simons     Onset Date/Surgical Date  07/20/18    Hand Dominance  Right    Prior Therapy  CIR, HH therapies.  Only had HH therapies after first CVA      Precautions   Precautions  Other (comment);Fall    Precaution Comments  no driving    Required Braces or Orthoses  Other Brace/Splint    Other Brace/Splint  using R foot up brace to prevent R toe drag      Restrictions   Weight Bearing Restrictions  No    Other Position/Activity Restrictions  no      Balance Screen   Has the patient fallen in the past 6 months  No      Home Environment   Living Environment  Private residence    Living Arrangements  Other relatives    Type of Home  Apartment    Home Access  Level entry    Home Layout  One level    Pleasants - 2 wheels;Wheelchair - manual;Cane - single point;Shower seat - built in      Prior Function   Level of Independence  Independent    Vocation  Retired      Observation/Other Assessments   Focus on Therapeutic Outcomes (FOTO)   53%; 47% impaired.  Predicted function 60%/40% impairment      Transfers   Transfers  Sit to Stand;Stand to Sit    Sit to Stand  4: Min guard    Stand to Sit  4: Min guard    Transfer Cueing  cues to shift COG forwards over BOS      Ambulation/Gait   Ambulation/Gait  Yes    Ambulation/Gait Assistance  4: Min assist    Ambulation Distance (Feet)  60 Feet    Assistive device  Rolling walker;None    Gait Pattern  Step-through pattern;Decreased step length - right;Decreased step length - left;Decreased stride length;Decreased hip/knee flexion - right;Decreased dorsiflexion - right;Scissoring;Narrow base of support    Ambulation Surface  Level;Indoor      Standardized Balance Assessment   Standardized Balance Assessment  Berg Balance Test;10 meter walk test;Five Times Sit to Stand    Five times sit to stand comments   19 seconds from chair with UE support; Min A due to posterior leaning and  falling backwards into chair.  Increased difficulty shifting weight forwards    10 Meter Walk  20.3 seconds with RW or 1.6 ft/sec.  Without RW 24 seconds or 1.3 ft/sec with MIN A  Berg Balance Test   Sit to Stand  Able to stand  independently using hands    Standing Unsupported  Able to stand 2 minutes with supervision    Sitting with Back Unsupported but Feet Supported on Floor or Stool  Able to sit safely and securely 2 minutes    Stand to Sit  Controls descent by using hands    Transfers  Able to transfer safely, definite need of hands    Standing Unsupported with Eyes Closed  Able to stand 10 seconds with supervision    Standing Unsupported with Feet Together  Able to place feet together independently and stand for 1 minute with supervision    From Standing, Reach Forward with Outstretched Arm  Can reach confidently >25 cm (10")    From Standing Position, Pick up Object from Floor  Able to pick up shoe, needs supervision    From Standing Position, Turn to Look Behind Over each Shoulder  Turn sideways only but maintains balance    Turn 360 Degrees  Needs assistance while turning    Standing Unsupported, Alternately Place Feet on Step/Stool  Able to complete >2 steps/needs minimal assist    Standing Unsupported, One Foot in Front  Able to take small step independently and hold 30 seconds    Standing on One Leg  Able to lift leg independently and hold 5-10 seconds    Total Score  37    Berg comment:  37/56                           PT Education - 01/31/19 1240    Education Details  changes in function and balance since being on hold, areas to continue to focus on.  Will need to get medical clearance for aquatic therapy due to medical co-morbidities.  Asked pt to bring power chair to therapy for training.    Person(s) Educated  Patient    Methods  Explanation    Comprehension  Verbalized understanding       PT Short Term Goals - 01/31/19 1651      PT SHORT  TERM GOAL #1   Title  Pt will improve gait velocity with RW to >/= 1.62ft/sec and 1.6 ft/sec without RW    Baseline  1.6 ft/sec with RW; 1.3 ft/sec without RW    Status  Revised    Target Date  03/02/19      PT SHORT TERM GOAL #2   Title  Pt will perform initial HEP with supervision    Time  4    Period  Weeks    Status  Revised    Target Date  03/02/19      PT SHORT TERM GOAL #3   Title  Pt will improve LE strength as indicated by decrease in five time sit to stand to </= 17 seconds with use of UE and improved weight shift forwards    Baseline  19 seconds    Time  4    Period  Weeks    Status  Revised    Target Date  03/02/19      PT SHORT TERM GOAL #4   Title  Pt will decrease falls risk as indicated by increase in BERG score by 4 points    Baseline  37/56    Time  4    Period  Weeks    Status  Revised    Target Date  03/02/19  PT SHORT TERM GOAL #5   Title  Pt will perform stand pivot transfer with LRAD mod I    Baseline  Supervision with RW; min A with no UE support    Time  4    Period  Weeks    Status  Revised    Target Date  03/02/19      PT SHORT TERM GOAL #6   Title  Pt will ambulate x 230' over indoor surfaces with RW MOD I    Baseline  60' with RW min A    Time  4    Period  Weeks    Status  Revised    Target Date  03/02/19        PT Long Term Goals - 01/31/19 1655      PT LONG TERM GOAL #1   Title  Pt will demonstrate independence with HEP and walking program    Time  8    Period  Weeks    Status  Revised    Target Date  04/01/19      PT LONG TERM GOAL #2   Title  Pt will demonstrate gait velocity with LRAD or no AD >/= 2.0 ft/sec    Time  8    Period  Weeks    Status  Revised    Target Date  04/01/19      PT LONG TERM GOAL #3   Title  Pt will perform five time sit to stand in </= 15 seconds with use of UE    Time  8    Period  Weeks    Status  Revised    Target Date  04/01/19      PT LONG TERM GOAL #4   Title  Pt will improve  BERG balance score to >/= 45/56 to indicate decreased falls risk    Time  8    Period  Weeks    Status  Revised    Target Date  04/01/19      PT LONG TERM GOAL #5   Title  Pt will ambulate with LRAD over outdoor surfaces x 500' and negotiate curb with supervision    Time  8    Period  Weeks    Status  Revised    Target Date  04/01/19      PT LONG TERM GOAL #6   Title  Pt will negotiate 8 stairs with one rail and alternating sequence to improve access to community buildings with supervision    Time  8    Period  Weeks    Status  Revised    Target Date  04/01/19      PT LONG TERM GOAL #7   Title  Pt will improve overall function as indicated by FOTO to >/= 60%    Baseline  53% function    Time  8    Period  Weeks    Status  Revised    Target Date  04/01/19            Plan - 01/31/19 1244    Clinical Impression Statement  Treatment session focused on reassessment of pt functional level and impairments following being on hold for a few months due to COVID quarantine and pt missing multiple sessions due to lack of transportation.  Pt is demonstrating improvement in balance and safety with gait as indicated by improvements in BERG and gait velocity and improvement in LE strength as indicated by five time sit to stand  time.  Pt continues to demonstrate impaired weight shifting anteriorly during sit <> stand, impaired R sided strength, impaired standing balance and gait - pt remains at significant risk for falls and will benefit from continued skilled PT services to address impairments and maximize functional mobility independence to further decrease falls risk.    Personal Factors and Comorbidities  Comorbidity 3+;Transportation;Other   continued tobacco use   Comorbidities  OA, h/o CVA with residual L sided weakness, tobacco abuse, past substance abuse, Cameron ulcer, chronic LBP, GERD, GIB, glaucoma, headache, hepatitis C, hiatal hernia, inguinal hernia repair, history of blood  transfusion, history of gout, hyperlipidemia, HTN, Insomnia, ischemic colitis, bilat LE numbness, nocturia, prostate CA with prostatectomy, SOB, urinary frequency and urgency and falls    Examination-Activity Limitations  Bend;Carry;Lift;Locomotion Level;Stairs;Stand;Transfers;Reach Overhead    Examination-Participation Restrictions  Church;Community Activity;Driving;Meal Prep    Stability/Clinical Decision Making  --    Rehab Potential  Good    PT Frequency  2x / week    PT Duration  8 weeks    PT Treatment/Interventions  ADLs/Self Care Home Management;Electrical Stimulation;DME Instruction;Gait training;Stair training;Functional mobility training;Therapeutic activities;Therapeutic exercise;Balance training;Neuromuscular re-education;Patient/family education;Orthotic Fit/Training;Wheelchair mobility training;Aquatic Therapy    PT Next Visit Plan  CHECK [facility] VISIT with manual cuff.  Update or set new HEP.  RLE strengthening.  Gait with and without RW for household and eventually community.    Recommended Other Services  Will ask physician for clearance for aquatic therapy    Consulted and Agree with Plan of Care  Patient       Patient will benefit from skilled therapeutic intervention in order to improve the following deficits and impairments:  Abnormal gait, Decreased balance, Decreased coordination, Decreased strength, Difficulty walking, Increased edema, Impaired UE functional use, Pain  Visit Diagnosis: 1. Hemiplegia and hemiparesis following cerebral infarction affecting right dominant side (HCC)   2. Other lack of coordination   3. Unsteadiness on feet   4. Other abnormalities of gait and mobility        Problem List Patient Active Problem List   Diagnosis Date Noted  . Acute on chronic anemia   . Pain   . Agitation 07/26/2018  . Hepatitis C 07/26/2018  . Nontraumatic thalamic hemorrhage ([place]) 07/26/2018  . Osteoarthritis of left hip 04/06/2018  . Cameron ulcer,  chronic 01/11/2018  . Symptomatic anemia 01/10/2018  . Syncope 01/10/2018  . Hiatal hernia with gastroesophageal reflux disease and esophagitis 01/05/2018  . Insomnia 01/05/2018  . Rotator cuff syndrome of left shoulder 11/21/2017  . Anemia, iron deficiency   . Gastrointestinal bleeding 10/24/2015  . Tobacco abuse   . Acute ischemic stroke ([place])   . Stroke ([place]) 05/31/2015  . HTN (hypertension) 04/10/2014  . Inguinal hernia without mention of obstruction or gangrene, unilateral or unspecified, (not specified as recurrent)-right 01/30/2014   Rico Junker, PT, DPT 01/31/19    4:59 PM    [facility] [address] [place], [place], [address] Phone: [phone]   Fax:  [phone]  Name: Marqueze Ramcharan MRN: [id number] Date of Birth: [birth year]

## 2019-02-02 ENCOUNTER — Telehealth: Payer: Self-pay

## 2019-02-02 ENCOUNTER — Ambulatory Visit: Payer: Medicare Other | Admitting: Physical Therapy

## 2019-02-02 ENCOUNTER — Telehealth: Payer: Self-pay | Admitting: Family Medicine

## 2019-02-02 ENCOUNTER — Other Ambulatory Visit: Payer: Self-pay

## 2019-02-02 ENCOUNTER — Telehealth: Payer: Self-pay | Admitting: Physical Therapy

## 2019-02-02 ENCOUNTER — Ambulatory Visit: Payer: Medicare Other

## 2019-02-02 DIAGNOSIS — R471 Dysarthria and anarthria: Secondary | ICD-10-CM

## 2019-02-02 DIAGNOSIS — R41841 Cognitive communication deficit: Secondary | ICD-10-CM | POA: Diagnosis not present

## 2019-02-02 NOTE — Telephone Encounter (Signed)
Per [name] the Neuro therapist state that the pt called our office today for Dr. Volanda Napoleon to fill out a form for him to get assistance at his home due to his condition.  Pt state that the one that was scheduled to come in to assist him he has not seem him in over a 2 weeks.  [name] would like to have a call back [phone] to discuss further.

## 2019-02-02 NOTE — Telephone Encounter (Signed)
Pt last seen in February.  I think water aerobics could be beneficial however I cannot speak to his current ability to perform such activities.

## 2019-02-02 NOTE — Telephone Encounter (Signed)
Received a separate message about the pt from his therapist regarding concerns about pt having supervision.  Please refer to other note.

## 2019-02-02 NOTE — Telephone Encounter (Signed)
Please advise 

## 2019-02-02 NOTE — Patient Instructions (Signed)
  Please follow up with Dr. Volanda Napoleon regarding the paperwork for your "sitter".  Your next appt is 02-07-19 at 1pm - remember I put a yellow note on your folder for you to call for transportation.

## 2019-02-02 NOTE — Therapy (Signed)
[facility] [address] [place], [place], [address] Phone: [phone]   Fax:  [phone]  Speech Language Pathology Treatment  Patient Details  Name: [name] MRN: [id number] Date of Birth: Aug 31, 1950 Referring Provider (SLP): Alger Simons MD   Encounter Date: 02/02/2019  End of Session - 02/02/19 1655    Visit Number  3    Number of Visits  20    Date for SLP Re-Evaluation  04/11/19    SLP Start Time  8756   arrived at 1406   SLP Stop Time   1446    SLP Time Calculation (min)  39 min    Activity Tolerance  Patient tolerated treatment well       Past Medical History:  Diagnosis Date  . Arthritis   . Cameron ulcer, chronic 01/11/2018  . Chronic back pain   . GERD (gastroesophageal reflux disease)    uses Baking Soda  . GIB (gastrointestinal bleeding) 2012  . Glaucoma    right eye  . Headache    occasionally  . Hepatitis C   . Hiatal hernia with gastroesophageal reflux disease and esophagitis 01/05/2018  . History of blood transfusion    no abnormal reaction noted  . History of gout    doesn't take any meds  . Hyperlipidemia    not on any meds  . Hypertension    takes Amlodipine and Lisinopril daily  . Insomnia    takes Ambien nightly  . Ischemic colitis ([place]) 2012  . Joint pain   . Nocturia   . Numbness    both legs occasionally  . PONV (postoperative nausea and vomiting)   . Prostate cancer ([place])   . Shortness of breath dyspnea    rarely but when notices he can be lying/sitting/exertion.Dr.Hochrein is aware per pt  . Stroke ([place]) 2016   . Urinary frequency   . Urinary urgency     Past Surgical History:  Procedure Laterality Date  . APPENDECTOMY    . BIOPSY  01/11/2018   Procedure: BIOPSY;  Surgeon: Gatha Mayer, MD;  Location: [facility] ENDOSCOPY;  Service: Endoscopy;;  . COLONOSCOPY    . COLONOSCOPY N/A 10/26/2015   Procedure: COLONOSCOPY;  Surgeon: Doran Stabler, MD;   Location[facility] ENDOSCOPY;  Service: Endoscopy;  Laterality: N/A;  . ESOPHAGOGASTRODUODENOSCOPY    . ESOPHAGOGASTRODUODENOSCOPY N/A 10/26/2015   Procedure: ESOPHAGOGASTRODUODENOSCOPY (EGD);  Surgeon: Doran Stabler, MD;  Location: [facility] ENDOSCOPY;  Service: Endoscopy;  Laterality: N/A;  . ESOPHAGOGASTRODUODENOSCOPY (EGD) WITH PROPOFOL N/A 01/11/2018   Procedure: ESOPHAGOGASTRODUODENOSCOPY (EGD) WITH PROPOFOL;  Surgeon: Gatha Mayer, MD;  Location: [facility] ENDOSCOPY;  Service: Endoscopy;  Laterality: N/A;  . INGUINAL HERNIA REPAIR Right 11/04/2014   Procedure: RIGHT INGUINAL HERNIA REPAIR WITH MESH;  Surgeon: Jackolyn Confer, MD;  Location: [facility];  Service: General;  Laterality: Right;  . MASS EXCISION N/A 11/04/2014   Procedure: REMOVAL OF RIGHT GROIN SOFT TISSUE MASS;  Surgeon: Jackolyn Confer, MD;  Location: [facility];  Service: General;  Laterality: N/A;  . Multiple abdominal surgeries     total of 13  . PROSTATECTOMY    . SMALL INTESTINE SURGERY      There were no vitals filed for this visit.  Subjective Assessment - 02/02/19 1420    Subjective  Pt missed his 1pm PT. Pt argued with SLP about this until he thought to get out his schedule and look - once SLP assisted pt to look at correct  day, pt stated "I guess I f-ed it up then and missed my 1 o'clock therapy."    Currently in Pain?  Yes    Pain Score  7     Pain Location  Leg    Pain Orientation  Right            ADULT SLP TREATMENT - 02/02/19 1429      General Information   Behavior/Cognition  Alert;Cooperative;Pleasant mood      Treatment Provided   Treatment provided  Cognitive-Linquistic      Cognitive-Linquistic Treatment   Treatment focused on  Cognition    Skilled Treatment  Pt checked in 1406. SLP questioned pt today about his schedule as he missed PT at 1300. Pt insisting he didn't miss any appointmetns. Pt had idea to look at his therapy schedule about 60 seconds after he began insisting there was not another appt prior to  ST. When SLP assisted pt in looking at the correct day, pt stated, "Well I guess I f-ed it up then and missed the 1 oclock therapy." Pt explaining away his deficits of decr'd awareness and reasoning/problem solving. Upon SLP questioning pt of anyone to assist him in keeping his schedule straight, pt insisted there is nobody else (inlcuding family members) to assist him with managing his schedule. In the midst of this conversation pt told SLP that the care provider who was set up at hospital d/c to assist pt and provide 24/7 supervisioin was no longer with pt. Pt had a roommate who was supposed to assist him however pt states this roommate hasn't been seen by him in 2 days. "It's a friend of my daughter's."  SLP attempted to ascertain what pt's plans were as he needs assistance at home- "I called my doctor's office to try to get her to do the paperwork for me to get someone to sit with me." SLP did not see telephone encounter in pt's chart. When questioned about this pt stated he called MD office 30 minutes ago and was waiting for a call back. SLP phoned MD office and informed office of pt's account of this situation.       Assessment / Recommendations / Plan   Plan  Continue with current plan of care      Progression Toward Goals   Progression toward goals  Progressing toward goals       SLP Education - 02/02/19 1654    Education Details  d/c'd from CIR with 24/7 supervision needed, pt needs someone to assist with schedule management (and likely with meds)    Person(s) Educated  Patient    Methods  Explanation    Comprehension  Verbalized understanding;Need further instruction       SLP Short Term Goals - 02/02/19 1658      SLP SHORT TERM GOAL #1   Title  pt will utilize memory compensations in 3 sessions    Time  5    Period  Weeks    Status  On-going      SLP SHORT TERM GOAL #2   Title  pt will undergo full cognitive testing in the first two therapy sessions    Time  2    Period  Weeks     Status  Achieved      SLP SHORT TERM GOAL #3   Title  Pt will report carryover of 3 safety strategies in the kitchen and when walking over 2 sessions    Time  5  Period  Weeks    Status  On-going      SLP SHORT TERM GOAL #4   Title  Pt will utilize compensations for medication management for daily meds as well as having meds refilled before they run out over 2 sessions    Time  5    Period  Weeks    Status  On-going       SLP Long Term Goals - 02/02/19 1658      SLP LONG TERM GOAL #1   Title  pt will utilize memory compnesations when appropriate to do so, in 90% of opportunities over three sessions    Time  10    Period  Weeks   or 20 visits   Status  On-going      SLP LONG TERM GOAL #2   Title  Pt will utilize memory binder/external aids to manage his appointments, schedules, to do lists/grocery lists with mod I over 3 sessions    Time  10    Period  Weeks    Status  On-going      SLP LONG TERM GOAL #3   Title  Pt will utilize compensatory strategies for memory, attention and safety for high level ADL's with occasional min A over 2 sessions    Time  10    Period  Weeks    Status  On-going       Plan - 02/02/19 1656    Clinical Impression Statement  Pt presents today with cont'd memory deficits and other cognitive communciatoin impairments affecting safety, judgment, and awareness. SLP phoned pt's PCP to let her know pt stated he called her office requesting she fill out paperwork for pt to have "someone to sit with me." Pt stated he called 30 mintues prior to appointment today. Pt also with mild dysarthria. Pt would cont to benefit from skilled ST to target cognitive deficits as well as dysarthrai, however pt desires to only work on cognitive linguisitcs at this time., to improve his independen Pt likely still requires 24/7 supervision.    Speech Therapy Frequency  2x / week   however x1/week x4 weeks, then x2/week for 8 weeks due to COVID-19 crisis   Duration  --    x10 weeks, or 20 visits   Treatment/Interventions  SLP instruction and feedback;Patient/family education;Internal/external aids;Cognitive reorganization;Cueing hierarchy;Environmental controls;Compensatory techniques;Functional tasks    Potential to Achieve Goals  Good    Potential Considerations  Family/community support;Ability to learn/carryover information    Consulted and Agree with Plan of Care  Patient       Patient will benefit from skilled therapeutic intervention in order to improve the following deficits and impairments:   1. Cognitive communication deficit   2. Dysarthria and anarthria       Problem List Patient Active Problem List   Diagnosis Date Noted  . Acute on chronic anemia   . Pain   . Agitation 07/26/2018  . Hepatitis C 07/26/2018  . Nontraumatic thalamic hemorrhage ([place]) 07/26/2018  . Osteoarthritis of left hip 04/06/2018  . Cameron ulcer, chronic 01/11/2018  . Symptomatic anemia 01/10/2018  . Syncope 01/10/2018  . Hiatal hernia with gastroesophageal reflux disease and esophagitis 01/05/2018  . Insomnia 01/05/2018  . Rotator cuff syndrome of left shoulder 11/21/2017  . Anemia, iron deficiency   . Gastrointestinal bleeding 10/24/2015  . Tobacco abuse   . Acute ischemic stroke ([place])   . Stroke ([place]) 05/31/2015  . HTN (hypertension) 04/10/2014  . Inguinal hernia without mention of  obstruction or gangrene, unilateral or unspecified, (not specified as recurrent)-right 01/30/2014    [facility] ,MS, CCC-SLP  02/02/2019, 4:59 PM  [facility] [address] [place], [place], [address] Phone: [phone]   Fax:  [phone]   Name: [name] MRN: [id number] Date of Birth: August 02, 1950

## 2019-02-02 NOTE — Telephone Encounter (Signed)
Hello Dr. Naaman Plummer,  Alanda Slim has returned to outpatient PT and OT after being on hold due to quaratine.  He has expressed interest in participating in aquatic therapy with Vinnie Level or Santiago Glad to learn appropriate exercises to perform on his own at the [facility] once gyms have re-opened.  Due to his cardiac and neurological history do you feel there would be any contraindications to him performing exercises submerged in 4-30ft of water?  Thanks, Rico Junker, PT, DPT 02/02/19    4:08 PM

## 2019-02-02 NOTE — Telephone Encounter (Signed)
Pt last seen by this provider 08/25/2018, at which time his gf was present during visit.  Unclear who if anyone is staying with the pt at this time.  This provider has not received any paperwork from the pt at this time.  Given concerns for pt's safety would agree with contacting the proper organizations to further investigate.

## 2019-02-02 NOTE — Telephone Encounter (Signed)
SLP called pt's PCP ([facility]) to check on an account pt told SLP today in therapy- that pt had phoned PCP approx 30 minutes prior to his ST session today, in order to ask PCP to fill out paperwork for a "sitter" for pt, as he is no longer with the individual who was to provide 24/7 supervision upon d/c from inpatient rehab. SLP asked pt if anyone was staying with him currently and pt stated a "friend of my daughter's" was supposed to be with pt at this time but pt reported he hadn't seen this person in 2 days. SLP has concern for pt's safety as he has cognitive linguistic deficits affecting his memory, awareness, safety, reasoning, and judgement.

## 2019-02-05 NOTE — Telephone Encounter (Signed)
Attempted to call Glendell Docker pt physical therapist, not able to speak to his office states that he was with another pt and he would return my call soon as he is finished with pt

## 2019-02-05 NOTE — Telephone Encounter (Signed)
Spoke with Glendell Docker OT therapist states that pt will be going home soon an they will provide with information on how to get in touch with some facility that provide supervision/ HH for pt. States nothing further needed

## 2019-02-06 ENCOUNTER — Telehealth: Payer: Self-pay | Admitting: *Deleted

## 2019-02-06 NOTE — Telephone Encounter (Signed)
Copied from [place]. Topic: Appointment Scheduling - Scheduling Inquiry for Clinic >> Feb 02, 2019  1:26 PM Nils Flack wrote: Reason for CRM: please call pt to schedule appt for follow up on chronic issues and to discuss home health to help around the house

## 2019-02-06 NOTE — Telephone Encounter (Signed)
Patient has an appt. On 7/23

## 2019-02-07 ENCOUNTER — Ambulatory Visit: Payer: Medicare Other | Admitting: Physical Therapy

## 2019-02-07 ENCOUNTER — Encounter: Payer: Self-pay | Admitting: Physical Therapy

## 2019-02-07 ENCOUNTER — Encounter: Payer: Self-pay | Admitting: Speech Pathology

## 2019-02-07 ENCOUNTER — Other Ambulatory Visit: Payer: Self-pay

## 2019-02-07 ENCOUNTER — Ambulatory Visit: Payer: Medicare Other | Admitting: Speech Pathology

## 2019-02-07 VITALS — BP 122/72

## 2019-02-07 DIAGNOSIS — R2681 Unsteadiness on feet: Secondary | ICD-10-CM

## 2019-02-07 DIAGNOSIS — R471 Dysarthria and anarthria: Secondary | ICD-10-CM

## 2019-02-07 DIAGNOSIS — R41841 Cognitive communication deficit: Secondary | ICD-10-CM | POA: Diagnosis not present

## 2019-02-07 DIAGNOSIS — R2689 Other abnormalities of gait and mobility: Secondary | ICD-10-CM

## 2019-02-07 DIAGNOSIS — I69351 Hemiplegia and hemiparesis following cerebral infarction affecting right dominant side: Secondary | ICD-10-CM

## 2019-02-07 NOTE — Patient Instructions (Addendum)
  Put your appointments in you phone  Set alarm 15-20 minutes prior to your appointments  Go to the clock/timer - on the bottom of your phone  Use your pill organizer for am and pm - fill it each Sunday  Use a timer in the kitchen when you are cooking so you remember to get the food out when it's done    Put [facility] transportation phone number in your phone  Put Dr. Carita Pian phone number in your phone

## 2019-02-07 NOTE — Patient Instructions (Signed)
Access Code: [email or web address]  URL: [email or web address]  Date: 02/07/2019  Prepared by: Willow Ora   Exercises  Seated Hamstring Curl with Anchored Resistance - 10 reps - 2 sets - 1x daily - 6x weekly  Seated Ankle Dorsiflexion with Resistance - 10 reps - 2 sets - 1x daily - 6x weekly  Sit to Stand - 5 reps - 2 sets - 1x daily - 6x weekly  Heel Toe Raises with Counter Support - 10 reps - 1 sets - 1x daily - 5x weekly  Squat with Chair and Counter Support - 10 reps - 1 sets - 1x daily - 5x weekly  Standing Hip Abduction with Counter Support - 10 reps - 1 sets - 1x daily - 5x weekly  Romberg Stance with Eyes Closed - 10 reps - 1 sets - 1x daily - 5x weekly  Wide Stance with Eyes Closed and Head Rotation - 10 reps - 1 sets - 1x daily - 5x weekly  Wide Stance with Eyes Closed and Head Nods - 10 reps - 1 sets - 1x daily - 5x weekly
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
## 2019-02-08 ENCOUNTER — Ambulatory Visit (INDEPENDENT_AMBULATORY_CARE_PROVIDER_SITE_OTHER): Payer: Medicare Other | Admitting: Family Medicine

## 2019-02-08 ENCOUNTER — Encounter: Payer: Self-pay | Admitting: Family Medicine

## 2019-02-08 DIAGNOSIS — R2681 Unsteadiness on feet: Secondary | ICD-10-CM

## 2019-02-08 DIAGNOSIS — I69318 Other symptoms and signs involving cognitive functions following cerebral infarction: Secondary | ICD-10-CM | POA: Diagnosis not present

## 2019-02-08 DIAGNOSIS — I10 Essential (primary) hypertension: Secondary | ICD-10-CM

## 2019-02-08 DIAGNOSIS — I6389 Other cerebral infarction: Secondary | ICD-10-CM

## 2019-02-08 NOTE — Progress Notes (Signed)
Virtual Visit via Telephone Note  I connected with [name] on 02/08/19 at 10:00 AM EDT by telephone and verified that I am speaking with the correct person using two identifiers.   I discussed the limitations, risks, security and privacy concerns of performing an evaluation and management service by telephone and the availability of in person appointments. I also discussed with the patient that there may be a patient responsible charge related to this service. The patient expressed understanding and agreed to proceed.  Location patient: home Location provider: work or home office Participants present for the call: patient, provider Patient did not have a visit in the prior 7 days to address this/these issue(s).   History of Present Illness: Pr is a 68 yo male with pmh sig for CVA, hemiparesis, Hep C, HTN, GERD, h/o GIB, OA, tobacco use, HLD, gout, glaucoma, acute on chronic anemia who was contacted for f/u.  Pt states he needs a nurse at home.  Pt and his fiance broke up after 9 yrs so he is home alone.  Pt states it hard for him to bathe, get his shoes on, move around, etc.  Pt mostly making sandwiches, as "can't do traditional cooking".  Pt going to the grocery store in his wheelchair, only able to get a few items at a time.  Per pt "I only get $25 on my food stamp card".  Pt using a pill box to organize his meds.  States there is no one that can help him.  Pt denies falls, though outpt rehab note mentions recent fall.  Pt going to outpatient rehab twice a wk.  Transportation provided by his [facility].  Pt states he will not go to a NH.   Observations/Objective: Patient sounds well on the phone. I do not appreciate any SOB. Speech and thought processing are grossly intact. Patient reported vitals:  Assessment and Plan: Cerebrovascular accident (CVA) due to other mechanism [facility])  -Numerous concerns about pt's safety and ability to remain at home given reports from rehab  and by pt's above account.  Had a discussion with pt regarding SNF, ALF, etc, but he declines and becomes upset.  Will have HH evaluate pt. -continue outpt services for now. - Plan: Ambulatory referral to [place] [facility]  -continue current meds: Norvasc 10 mg, hydralazine 10 mg qhs, lisinopril 40 mg,  - Plan: Ambulatory referral to [place]  Gait instability  -using wheelchair for mobility - Plan: Ambulatory referral to [place]   Follow Up Instructions: F/u prn   I did not refer this patient for an OV in the next 24 hours for this/these issue(s).  I discussed the assessment and treatment plan with the patient. The patient was provided an opportunity to ask questions and all were answered. The patient agreed with the plan and demonstrated an understanding of the instructions.   The patient was advised to call back or seek an in-person evaluation if the symptoms worsen or if the condition fails to improve as anticipated.  I provided 11 minutes of non-face-to-face time during this encounter.   Billie Ruddy, MD

## 2019-02-09 ENCOUNTER — Ambulatory Visit: Payer: Medicare Other | Admitting: Physical Therapy

## 2019-02-09 ENCOUNTER — Ambulatory Visit: Payer: Medicare Other

## 2019-02-09 ENCOUNTER — Ambulatory Visit: Payer: Medicare Other | Admitting: Occupational Therapy

## 2019-02-09 ENCOUNTER — Encounter: Payer: Self-pay | Admitting: Occupational Therapy

## 2019-02-09 DIAGNOSIS — R471 Dysarthria and anarthria: Secondary | ICD-10-CM

## 2019-02-09 DIAGNOSIS — R41841 Cognitive communication deficit: Secondary | ICD-10-CM

## 2019-02-09 NOTE — Therapy (Addendum)
[facility] [address] [place], [place], [address] Phone: [phone]   Fax:  [phone]  Patient Details  Name: [name] MRN: [id number] Date of Birth: 1951-06-02 Referring Provider:  No ref. provider found  Encounter Date: 02/09/2019  OCCUPATIONAL THERAPY DISCHARGE SUMMARY  Visits from Start of Care: 2  Current functional level related to goals / functional outcomes: Pt did not meet goals as he was only seen for 2 visits then placed on hold due to [place].    Remaining deficits: Decreased strength, decreased balance, decreased coordination, cognitive deficits   Education / Equipment: Pt was educated in initial HEP, however education was not completed due to COVID-19. Pt returned to PT, ST earlier this week.(OT met with patient today along with the other disciplines however there was no charge for today) Per discussion with the therapy treatment team, this therapist is in agreement with pt transitioning to home health therapies so that he may receive assistance in the home and have the assistance of a home health aide. Pt has reported a fall this week and unsafe behaviors such as chopping vegetables with his weak hand to ST. Pt had a telehealth visit with his MD yesterday and  she agreed to make the referral to home health This occupational therapist met with patient along with speech therapy and PTA. Pt was behaving erratically today, pt did not appear to recall the discussion with his MD yesterday.. OT, ST and PTA reinforced the benefits of transitioning to home health therapies temporarily so that pt can get additional assistance in the home.(Pt reports breaking up with his girlfriend and now he has inconsistent assistance. Pt reported to ST that he had a fall this week).  Please refer to ST discharge for more detailed account of the discussion with patient today.  After a lengthy discussion, pt. calmed down and  agreed to home health, as long as he could choose the agency and who his aide is. Plan: Patient agrees to discharge.  Patient goals were not met.                                                                                                      ?????     , 02/09/2019, 3:15 PM  [facility] [address] [place], [place], [address] Phone: [phone]   Fax:  [phone]
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
## 2019-02-13 ENCOUNTER — Ambulatory Visit: Payer: Medicare Other | Admitting: Occupational Therapy

## 2019-02-13 ENCOUNTER — Ambulatory Visit: Payer: Medicare Other | Admitting: Physical Therapy

## 2019-02-14 ENCOUNTER — Encounter: Payer: Self-pay | Admitting: Physical Therapy

## 2019-02-14 NOTE — Therapy (Signed)
[facility] [address] [place], [place], [address] Phone: [phone]   Fax:  [phone]  Patient Details  Name: [name] MRN: [id number] Date of Birth: [birth year] Referring Provider:  No ref. provider found  Encounter Date: 02/14/2019  PHYSICAL THERAPY DISCHARGE SUMMARY  Visits from Start of Care: 4  Current functional level related to goals / functional outcomes: Unable to fully assess; pt had recently returned to outpatient therapies after being on hold due to COVID-19 quarantine.  Pt had reported to PCP that his fiance was no longer with him and that he was having a very hard time managing at home.  PCP recommended HH therapies for now to assess and improve safety at home.  Outpatient to D/C for now but anticipate pt will benefit from return to outpatient therapies once [facility] is complete.   Remaining deficits: Hemiplegia, impaired strength, impaired balance and gait, increased falls risk   Education / Equipment: HEP  Plan: Patient agrees to discharge.  Patient goals were not met. Patient is being discharged due to the physician's request.  ?????     Rico Junker, PT, DPT 02/14/19    3:37 PM    [facility] [address] [place], [place], [address] Phone: [phone]   Fax:  [phone]

## 2019-02-15 ENCOUNTER — Ambulatory Visit: Payer: Medicare Other | Admitting: Occupational Therapy

## 2019-02-15 ENCOUNTER — Ambulatory Visit: Payer: Medicare Other | Admitting: Speech Pathology

## 2019-02-19 ENCOUNTER — Ambulatory Visit: Payer: Medicare Other | Admitting: Physical Therapy

## 2019-02-19 ENCOUNTER — Other Ambulatory Visit: Payer: Self-pay | Admitting: Family Medicine

## 2019-02-19 ENCOUNTER — Ambulatory Visit: Payer: Medicare Other | Admitting: Speech Pathology

## 2019-02-19 ENCOUNTER — Ambulatory Visit: Payer: Medicare Other | Admitting: Occupational Therapy

## 2019-02-22 ENCOUNTER — Ambulatory Visit: Payer: Medicare Other

## 2019-02-22 ENCOUNTER — Ambulatory Visit: Payer: Medicare Other | Admitting: Occupational Therapy

## 2019-02-22 ENCOUNTER — Ambulatory Visit: Payer: Medicare Other | Admitting: Physical Therapy

## 2019-02-26 ENCOUNTER — Ambulatory Visit: Payer: Medicare Other | Admitting: Physical Therapy

## 2019-02-26 ENCOUNTER — Encounter: Payer: Medicare Other | Admitting: Occupational Therapy

## 2019-03-01 ENCOUNTER — Ambulatory Visit: Payer: Medicare Other | Admitting: Physical Therapy

## 2019-03-01 ENCOUNTER — Encounter: Payer: Medicare Other | Admitting: Occupational Therapy

## 2019-03-05 ENCOUNTER — Telehealth: Payer: Self-pay | Admitting: *Deleted

## 2019-03-05 NOTE — Telephone Encounter (Signed)
Copied from [place] [id number]. Topic: General - Other >> Mar 05, 2019 11:36 AM Rainey Pines A wrote: Patient stated that he was advised by Dr. Volanda Napoleon for in home nursing and that no one has come for the nursing for a month now. Patient would like a callback.  Attempted to contact patient. No answer and unable to leave a message since mail box is full.

## 2019-03-05 NOTE — Telephone Encounter (Signed)
Pt returning your call. Please call back °

## 2019-03-05 NOTE — Telephone Encounter (Signed)
Spoke with patient. Patient is requesting another referral for [facility] nursing and PT since he can't go to in patient rehab. Please advise. Patient can be reached at [phone]

## 2019-03-07 ENCOUNTER — Telehealth: Payer: Self-pay

## 2019-03-07 NOTE — Telephone Encounter (Signed)
Copied from [place] [phone]. Topic: General - Other >> Mar 07, 2019  8:46 AM Keene Breath wrote: Reason for CRM: [facility] [employer]. Called to speak with Dr. Volanda Napoleon regarding a report that was made and she stated that she has some questions.  Please advise and call back at [phone]

## 2019-03-11 NOTE — Telephone Encounter (Signed)
Call returned.

## 2019-03-12 ENCOUNTER — Telehealth: Payer: Self-pay | Admitting: Family Medicine

## 2019-03-12 NOTE — Telephone Encounter (Signed)
Please advise if ok to PT therapy

## 2019-03-12 NOTE — Telephone Encounter (Signed)
Message routed to Dr. Volanda Napoleon CMA, Izora Gala.

## 2019-03-12 NOTE — Telephone Encounter (Signed)
See chart.  Referral has been placed during last visit.

## 2019-03-12 NOTE — Telephone Encounter (Signed)
Copied from [place] [id number]. Topic: General - Inquiry >> Mar 09, 2019  3:08 PM Percell Belt A wrote: Reason for CRM: pt called in and stated that he is needing a home health nurse he would like to someone about getting this service. Advised him he may need apt.  He stated he has had a couple of strokes and needs assistance

## 2019-03-12 NOTE — Telephone Encounter (Signed)
Patient stated someone was suppose to be calling him about having physical therapy to help him to walk and no one has called him.  Please advise.

## 2019-03-13 NOTE — Telephone Encounter (Signed)
Pt states he wants referral to [facility].  No where else. Pt would like a call back asap when this is done.   Pt states he has been waiting a month and he has not learned to walk again.

## 2019-03-15 NOTE — Telephone Encounter (Signed)
Spoke with Hilda Blades our referral coordinator who states that [facility] will not take pt due to being full for the year, Detailed VM left  for pt to call the office

## 2019-03-15 NOTE — Telephone Encounter (Signed)
Pt returned phone call, requesting call back. Please advise  Best contact: [phone]

## 2019-03-16 NOTE — Telephone Encounter (Signed)
Spoke with pt states that he will be calling the office with the name of the [facility] that he wants, pt aware that  [facility] if full and they are not accepting more pt

## 2019-03-20 ENCOUNTER — Other Ambulatory Visit: Payer: Self-pay | Admitting: Family Medicine

## 2019-03-20 ENCOUNTER — Other Ambulatory Visit: Payer: Self-pay | Admitting: Physical Medicine & Rehabilitation

## 2019-03-20 DIAGNOSIS — Z720 Tobacco use: Secondary | ICD-10-CM

## 2019-03-30 ENCOUNTER — Other Ambulatory Visit: Payer: Self-pay

## 2019-03-30 ENCOUNTER — Telehealth (INDEPENDENT_AMBULATORY_CARE_PROVIDER_SITE_OTHER): Payer: Medicare Other | Admitting: Family Medicine

## 2019-03-30 DIAGNOSIS — R430 Anosmia: Secondary | ICD-10-CM

## 2019-03-30 DIAGNOSIS — Z7189 Other specified counseling: Secondary | ICD-10-CM | POA: Diagnosis not present

## 2019-03-30 NOTE — Progress Notes (Signed)
Virtual Visit via Telephone Note  I connected with [name] on 03/30/19 at  2:30 PM EDT by telephone and verified that I am speaking with the correct person using two identifiers.   I discussed the limitations, risks, security and privacy concerns of performing an evaluation and management service by telephone and the availability of in person appointments. I also discussed with the patient that there may be a patient responsible charge related to this service. The patient expressed understanding and agreed to proceed.  Location patient: home Location provider: work or home office Participants present for the call: patient, provider Patient did not have a visit in the prior 7 days to address this/these issue(s).   History of Present Illness: Pt states he has not been able to taste or smell since he got out of the hospital for acute CVA (admitted 1/8-1/31/2020 for acute thalamic hemorrhage).  He states his mouth and tongue feel numb at times.  Pt denies HA, diarrhea, cough, rhinorrhea, sore throat, rash, fever.  Pt would like COVID testing.   Observations/Objective: Patient sounds cheerful and well on the phone. I do not appreciate any SOB. Speech and thought processing are grossly intact. Patient reported vitals:  Assessment and Plan: Anosmia -chronic.  Likely related to CVA 07/20/2018.  Cannot r/o COVID infection. -Given info on community testing sites. -given precautions  Educated About Covid-19 Virus Infection -questions answered to satisfaction -pt interested in testing.  Given info on community testing sites.   Follow Up Instructions: F/u prn  I did not refer this patient for an OV in the next 24 hours for this/these issue(s).  I discussed the assessment and treatment plan with the patient. The patient was provided an opportunity to ask questions and all were answered. The patient agreed with the plan and demonstrated an understanding of the instructions.   The  patient was advised to call back or seek an in-person evaluation if the symptoms worsen or if the condition fails to improve as anticipated.  I provided 5 minutes of non-face-to-face time during this encounter.   Billie Ruddy, MD

## 2019-04-10 ENCOUNTER — Telehealth: Payer: Self-pay | Admitting: Family Medicine

## 2019-04-10 NOTE — Telephone Encounter (Signed)
Accessible Collection Services Application to be filled out;placed in dr's folder.  Upon completion, mail to:  [address] [place], [place] [address]

## 2019-04-10 NOTE — Telephone Encounter (Signed)
Pt form has been received and placed on Dr Volanda Napoleon desk for completing

## 2019-04-11 NOTE — Telephone Encounter (Signed)
Pt called back in to follow up on paperwork. Pt says that he would like to have this completed as soon as possible. Pt would like a call when ready.

## 2019-04-12 ENCOUNTER — Other Ambulatory Visit: Payer: Self-pay

## 2019-04-12 ENCOUNTER — Encounter: Payer: Self-pay | Admitting: Family Medicine

## 2019-04-12 ENCOUNTER — Telehealth (INDEPENDENT_AMBULATORY_CARE_PROVIDER_SITE_OTHER): Payer: Medicare Other | Admitting: Family Medicine

## 2019-04-12 ENCOUNTER — Other Ambulatory Visit: Payer: Self-pay | Admitting: Physical Medicine & Rehabilitation

## 2019-04-12 DIAGNOSIS — I10 Essential (primary) hypertension: Secondary | ICD-10-CM

## 2019-04-12 DIAGNOSIS — G8929 Other chronic pain: Secondary | ICD-10-CM

## 2019-04-12 DIAGNOSIS — I619 Nontraumatic intracerebral hemorrhage, unspecified: Secondary | ICD-10-CM

## 2019-04-12 DIAGNOSIS — F5101 Primary insomnia: Secondary | ICD-10-CM

## 2019-04-12 DIAGNOSIS — T25222A Burn of second degree of left foot, initial encounter: Secondary | ICD-10-CM | POA: Diagnosis not present

## 2019-04-12 DIAGNOSIS — X118XXA Contact with other hot tap-water, initial encounter: Secondary | ICD-10-CM

## 2019-04-12 MED ORDER — ZOLPIDEM TARTRATE 5 MG PO TABS: 5.0000 mg | ORAL_TABLET | Freq: Every day | ORAL | 0 refills | Status: DC

## 2019-04-12 MED ORDER — ALPRAZOLAM 0.25 MG PO TABS: 0.2500 mg | ORAL_TABLET | Freq: Every day | ORAL | 0 refills | Status: DC | PRN

## 2019-04-12 MED ORDER — TRAMADOL HCL 50 MG PO TABS: 50.0000 mg | ORAL_TABLET | Freq: Two times a day (BID) | ORAL | 0 refills | Status: DC | PRN

## 2019-04-12 MED ORDER — AMLODIPINE BESYLATE 10 MG PO TABS: 10.0000 mg | ORAL_TABLET | Freq: Every day | ORAL | 0 refills | Status: DC

## 2019-04-12 MED ORDER — SILVER SULFADIAZINE 1 % EX CREA: 1.0000 "application " | TOPICAL_CREAM | Freq: Two times a day (BID) | CUTANEOUS | 0 refills | Status: DC

## 2019-04-12 MED ORDER — LISINOPRIL 40 MG PO TABS: 40.0000 mg | ORAL_TABLET | Freq: Every day | ORAL | 0 refills | Status: DC

## 2019-04-12 MED ORDER — QUETIAPINE FUMARATE 50 MG PO TABS: 75.0000 mg | ORAL_TABLET | Freq: Every day | ORAL | 0 refills | Status: DC

## 2019-04-12 MED ORDER — HYDRALAZINE HCL 10 MG PO TABS: 10.0000 mg | ORAL_TABLET | Freq: Every day | ORAL | 0 refills | Status: DC

## 2019-04-12 NOTE — Telephone Encounter (Signed)
Pt called and asked you to mail the form back as he is in a wheel chair.
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
## 2019-04-13 NOTE — Telephone Encounter (Signed)
LVM for pt to contact the office to provide information to complete his Accessible form dropped at the office.

## 2019-04-18 NOTE — Telephone Encounter (Signed)
Attempted to call pt with no success, pt forms have been mailed out for him to complete his part, notation send with the mail to send it back for Dr Volanda Napoleon signiture

## 2019-04-27 NOTE — Telephone Encounter (Signed)
Pt called in and stated that he is still needing this aid, he stated that he has not heard anything about home health .  He stated  that he also has a form there that was to be filled out for the garbage people to pick up his trash ?  He would like to know this status of this form  Best number [phone]

## 2019-04-30 ENCOUNTER — Other Ambulatory Visit: Payer: Self-pay

## 2019-04-30 ENCOUNTER — Telehealth: Payer: Self-pay | Admitting: Family Medicine

## 2019-04-30 NOTE — Telephone Encounter (Signed)
Spoke with pt regarding his gabage form that needed Dr Volanda Napoleon signature. Advised pt that the form was mailed back to him to complete his part before Dr Volanda Napoleon signed. Pt state that he has not received the form and he was going to check if they could sent him another form to complete. Awaiting on pt home health referral approval from Dr Volanda Napoleon.

## 2019-04-30 NOTE — Telephone Encounter (Signed)
Spoke with pt aware that his Home health referral was denied by the agencies, pt aware that the office referral Coordinator sent another referral to [facility], Pt advised to look for their phone call to schedule pt if they accept pt insurance

## 2019-04-30 NOTE — Telephone Encounter (Signed)
Caller name: Eduard Clos  From [facility] [employer]  Call back number: [phone]   Reason for call:  Requesting verbal orders and clarity regarding in patient home health services and rehab services, representative is in need of clarity, please advise

## 2019-04-30 NOTE — Telephone Encounter (Signed)
Spoke with pt explained that his referral was placed with 2 agencies back in July but declined his insurance, Both  agencies tried to contact pt with no success from the referral notes. Spoke with the office Referral Coordinator states that she will send the referral to [facility] hoping they take pt insurance. Pt was notified and advised to please answer his mobile phone incase he receives a phone call from the agency. Pt Verbalized understanding

## 2019-05-02 NOTE — Telephone Encounter (Signed)
Copied from [place] [phone]. Topic: Quick Communication - See Telephone Encounter >> May 01, 2019  6:27 PM [place] wrote: CRM for notification. See Telephone encounter for: 05/01/19. Pls call pt back he had been requesting nursing care and has not heard anything at all and feels he is getting worse. [phone] [id number].

## 2019-05-02 NOTE — Telephone Encounter (Signed)
Attempted to call [facility] but no option to speak with a member

## 2019-05-07 ENCOUNTER — Other Ambulatory Visit: Payer: Self-pay

## 2019-05-07 ENCOUNTER — Telehealth: Payer: Medicare Other | Admitting: Family Medicine

## 2019-05-08 ENCOUNTER — Other Ambulatory Visit: Payer: Self-pay | Admitting: Family Medicine

## 2019-05-08 ENCOUNTER — Other Ambulatory Visit: Payer: Self-pay | Admitting: Physical Medicine & Rehabilitation

## 2019-05-08 DIAGNOSIS — I10 Essential (primary) hypertension: Secondary | ICD-10-CM

## 2019-05-08 DIAGNOSIS — G8929 Other chronic pain: Secondary | ICD-10-CM

## 2019-05-08 DIAGNOSIS — Z720 Tobacco use: Secondary | ICD-10-CM

## 2019-05-08 NOTE — Telephone Encounter (Signed)
Patient PCP is Dr. Volanda Napoleon

## 2019-05-08 NOTE — Telephone Encounter (Signed)
Pt charts show that Clonidine was discontinued.

## 2019-05-08 NOTE — Telephone Encounter (Signed)
Pt LOV was 04/12/2019 with Dr Sarajane Jews who refilled pt Rx, Please advise

## 2019-05-08 NOTE — Telephone Encounter (Signed)
Pt LOV was 04/12/2019 and last refill was on the same date #60 tablets, please advise

## 2019-05-15 ENCOUNTER — Telehealth: Payer: Self-pay | Admitting: Family Medicine

## 2019-05-15 NOTE — Telephone Encounter (Signed)
Pt came in very upset that we had not sent him his trash form that his brother dropped off.  Pt is aware that we did not find any form in our records of that sort.  Pt stated that he know that his brother brought the form over from [facility].  Pt also dropped off two different forms to be filled out by the provider ([employer] and Home Assistance form) and would like to have forms mailed to his house [address], [place] [address].   Forms placed in providers folder for completion.

## 2019-05-19 DIAGNOSIS — I619 Nontraumatic intracerebral hemorrhage, unspecified: Secondary | ICD-10-CM | POA: Diagnosis not present

## 2019-05-19 DIAGNOSIS — R269 Unspecified abnormalities of gait and mobility: Secondary | ICD-10-CM | POA: Diagnosis not present

## 2019-05-19 DIAGNOSIS — I10 Essential (primary) hypertension: Secondary | ICD-10-CM | POA: Diagnosis not present

## 2019-05-19 DIAGNOSIS — I639 Cerebral infarction, unspecified: Secondary | ICD-10-CM | POA: Diagnosis not present

## 2019-05-22 NOTE — Telephone Encounter (Signed)
Spoke with pt explained that the form he had dropped off to the office was mailed out to his address on file, reason was the office kept calling pt with no success.

## 2019-05-31 NOTE — Telephone Encounter (Signed)
Pt states he did not receive paperwork.  Please call pt.

## 2019-06-01 NOTE — Telephone Encounter (Signed)
Attempted to contact patient. Phone " your call can not be completed. Please try again later." Clinic RN does not see a copy of paper work in chart

## 2019-06-04 NOTE — Telephone Encounter (Signed)
Patient returned Patient reports he still has not received forms in the mail. Advised patient that I will check with Medical Records to get a copy so he can come pick up forms. Patient verbalized understanding.

## 2019-06-06 ENCOUNTER — Telehealth: Payer: Self-pay

## 2019-06-06 NOTE — Telephone Encounter (Signed)
Copied from [place] [phone]. Topic: Quick Communication - See Telephone Encounter >> Jun 05, 2019  4:45 PM Loma Boston wrote: CRM for notification. See Telephone encounter for: 06/05/19. Pt is wanting to talk to a nurse and states that he can not taste anything for months and nobody returns calls quite agitated. [phone]

## 2019-06-06 NOTE — Telephone Encounter (Signed)
Spoke with patient. Patient notified forms have been copied and ready for pick up.

## 2019-06-11 ENCOUNTER — Other Ambulatory Visit: Payer: Self-pay | Admitting: Family Medicine

## 2019-06-11 DIAGNOSIS — I10 Essential (primary) hypertension: Secondary | ICD-10-CM

## 2019-06-11 DIAGNOSIS — Z720 Tobacco use: Secondary | ICD-10-CM

## 2019-06-11 DIAGNOSIS — F5101 Primary insomnia: Secondary | ICD-10-CM

## 2019-06-12 NOTE — Telephone Encounter (Signed)
Message routed to PCP CMA  

## 2019-06-12 NOTE — Telephone Encounter (Signed)
Pt last refill date for Seroquel was 04/12/2019 by Dr Sarajane Jews, Please advise

## 2019-06-12 NOTE — Telephone Encounter (Signed)
Pt is requesting refill on Wellbutrin 100 mg, please advise

## 2019-06-13 NOTE — Telephone Encounter (Signed)
Called pt left a voicemail on his mobile number, advised pt that his form is complete and needs pick up.Form has been placed at the office cabinet for pt

## 2019-06-13 NOTE — Telephone Encounter (Signed)
Prescriptions were prescribed by Dr Sarajane Jews, Bonne Dolores to contact pt left a voicemail for pt.

## 2019-06-18 DIAGNOSIS — I10 Essential (primary) hypertension: Secondary | ICD-10-CM | POA: Diagnosis not present

## 2019-06-18 DIAGNOSIS — I619 Nontraumatic intracerebral hemorrhage, unspecified: Secondary | ICD-10-CM | POA: Diagnosis not present

## 2019-06-18 DIAGNOSIS — I639 Cerebral infarction, unspecified: Secondary | ICD-10-CM | POA: Diagnosis not present

## 2019-06-18 DIAGNOSIS — R269 Unspecified abnormalities of gait and mobility: Secondary | ICD-10-CM | POA: Diagnosis not present

## 2019-06-19 NOTE — Telephone Encounter (Signed)
Given concerns about pt's medications will not refill Ambien at this time.

## 2019-06-21 NOTE — Telephone Encounter (Signed)
Called pt no option to leave a message on pt mobile phone

## 2019-06-22 ENCOUNTER — Other Ambulatory Visit: Payer: Self-pay | Admitting: Family Medicine

## 2019-06-22 DIAGNOSIS — F5101 Primary insomnia: Secondary | ICD-10-CM

## 2019-06-22 DIAGNOSIS — I10 Essential (primary) hypertension: Secondary | ICD-10-CM

## 2019-06-22 DIAGNOSIS — I619 Nontraumatic intracerebral hemorrhage, unspecified: Secondary | ICD-10-CM

## 2019-06-22 MED ORDER — LISINOPRIL 40 MG PO TABS: 40.0000 mg | ORAL_TABLET | Freq: Every day | ORAL | 0 refills | Status: DC

## 2019-06-22 MED ORDER — HYDRALAZINE HCL 10 MG PO TABS: 10.0000 mg | ORAL_TABLET | Freq: Every day | ORAL | 0 refills | Status: DC

## 2019-06-22 MED ORDER — PANTOPRAZOLE SODIUM 40 MG PO TBEC: DELAYED_RELEASE_TABLET | ORAL | 0 refills | Status: DC

## 2019-06-22 MED ORDER — AMLODIPINE BESYLATE 10 MG PO TABS: 10.0000 mg | ORAL_TABLET | Freq: Every day | ORAL | 1 refills | Status: DC

## 2019-06-22 NOTE — Telephone Encounter (Signed)
Medication: zolpidem (AMBIEN) 5 MG tablet [id number] , amLODipine (NORVASC) 10 MG tablet [id number] , cyclobenzaprine (FLEXERIL) 5 MG tablet [id number] , QUEtiapine (SEROQUEL) 50 MG tablet [id number] , hydrALAZINE (APRESOLINE) 10 MG tablet [id number] , pantoprazole (PROTONIX) 40 MG tablet [id number] , lisinopril (ZESTRIL) 40 MG tablet [id number] ,   Has the patient contacted their pharmacy? Yes  (Agent: If no, request that the patient contact the pharmacy for the refill.) (Agent: If yes, when and what did the pharmacy advise?)  Preferred Pharmacy (with phone number or street name): [facility], [place] [phone] (Phone) [phone] (Fax)    Agent: Please be advised that RX refills may take up to 3 business days. We ask that you follow-up with your pharmacy.

## 2019-06-22 NOTE — Telephone Encounter (Signed)
Pt is requesting refill on Flexeril please advise

## 2019-06-22 NOTE — Telephone Encounter (Signed)
Given concerns about pt's medications will not refill Ambien at this time per Dr Volanda Napoleon
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
## 2019-06-28 ENCOUNTER — Telehealth: Payer: Self-pay | Admitting: *Deleted

## 2019-06-28 NOTE — Telephone Encounter (Signed)
Copied from [place] [id number]. Topic: General - Inquiry >> Jun 28, 2019 12:25 PM [name], NT wrote: Reason for CRM: Pt called in stating he is wanting to come in office to have an appointment with PCP in regards to filling out paperwork so he can resume having a nurse come out to his house. Pt states PCP discontinued this back in July and was told he needs to fill out paperwork before they can resume. Please advise and call back/

## 2019-06-29 NOTE — Telephone Encounter (Signed)
Called pt left a voicemail to return my call int the office in regards to pt wanting to schedule an in office visit with Dr Volanda Napoleon.

## 2019-07-04 ENCOUNTER — Telehealth: Payer: Self-pay | Admitting: *Deleted

## 2019-07-04 NOTE — Telephone Encounter (Signed)
Pt's brother was following up to see if the form at the office is the [employer] verification form needed for pt to start receiving transportation from [employer]. He stated if it is the correct form it should be faxed to [phone] Attn: Almedia Balls He would like a callback to verify that this is the correct form. Please follow up with him. [phone]

## 2019-07-04 NOTE — Telephone Encounter (Signed)
Spoke with pt brother aware that our office has not had any success communicating with pt, several messages have been left on pt phone without callbacks. Spoke wit pt brother requested to mail out pt forms to his home address, forms mailed out on 07/04/2019 at 2.45 pm.

## 2019-07-04 NOTE — Telephone Encounter (Signed)
Copied from [place] [id number]. Topic: General - Other >> Jul 04, 2019  8:53 AM Keene Breath wrote: Reason for CRM: Patient's brother called to ask if there is a form for [employer] for the patient.  If it is, it should be faxed to [employer].  Patient's brother is going to call back to make sure that the Fax# is on the form.  Patient's brother, Olga Millers, CB# [phone]

## 2019-07-05 ENCOUNTER — Telehealth (INDEPENDENT_AMBULATORY_CARE_PROVIDER_SITE_OTHER): Payer: Medicare Other | Admitting: Family Medicine

## 2019-07-05 DIAGNOSIS — R430 Anosmia: Secondary | ICD-10-CM | POA: Diagnosis not present

## 2019-07-05 DIAGNOSIS — G4700 Insomnia, unspecified: Secondary | ICD-10-CM | POA: Diagnosis not present

## 2019-07-05 DIAGNOSIS — Z8673 Personal history of transient ischemic attack (TIA), and cerebral infarction without residual deficits: Secondary | ICD-10-CM | POA: Diagnosis not present

## 2019-07-05 MED ORDER — QUETIAPINE FUMARATE 100 MG PO TABS: 100.0000 mg | ORAL_TABLET | Freq: Every day | ORAL | 0 refills | Status: DC

## 2019-07-05 NOTE — Progress Notes (Signed)
Virtual Visit via Telephone Note  I connected with [name] on 07/05/19 at  4:30 PM EST by telephone and verified that I am speaking with the correct person using two identifiers.   I discussed the limitations, risks, security and privacy concerns of performing an evaluation and management service by telephone and the availability of in person appointments. I also discussed with the patient that there may be a patient responsible charge related to this service. The patient expressed understanding and agreed to proceed.  Location patient: home Location provider: work or home office Participants present for the call: patient, provider, pt's friend April. Patient did not have a visit in the prior 7 days to address this/these issue(s).   History of Present Illness: Pt requesting HH and aid services.  Pt accuses this provider of cancelling his therapy.  Pt informed that this provider did not cancel his therapy.  Pt also advised that a referral to [facility] was placed 02/08/19 by this provider, but multiple places declined.  Pt also advised that he was not answering his phone at times which causes a delay in getting things done.  Pt again states his services were cancelled and no one will refill his meds.  Pt advised of recent med refills that were available at the pharmacy.  Pt endorses not being able to sleep.  Was given recent rx for seroquel by Dr. Naaman Plummer, PM&R.  Pt states he is out of the med as he has been taking more than rx'd as 75 mg is "not enough".  Pt requested several other medications for sleep.  Pt becomes loud and stops listening to this provider or his friend April.    Pt also states this provider did not complete his forms for trash pick up and [employer].  Pt advised he was incorrect as this provider did complete and fax the SC[employer] form.  The trash pick up form did not make it this provider as the pt failed to complete his portion of the form.  Pt was made aware of this by office staff.  Pt  also with loss of taste and smell since Jan 2020 during hospitalization for CVA.   Observations/Objective: Patient sounds cheerful and well on the phone. I do not appreciate any SOB. Speech and thought processing are grossly intact. Patient reported vitals:  Assessment and Plan: Insomnia, unspecified type  -pt advised to take OTC melatonin at 7 pm -will increase seroquel from 75 mg to 100 mg -pt advised he would need to see psychiatry for further medications for sleep.  Pt does not agree with this plan.  Pt advised that Psychiatry would be better equipped to handle his sleep concerns. -discussed sleep hygiene. -Given concerns for pt's ability to keep up with medications and overall health issues, pt advised on the risk a/w taking numerous medications on the Beer's list. - Plan: QUEtiapine (SEROQUEL) 100 MG tablet  History of cerebrovascular accident (CVA) -decreased mobility since CVA in Jan 2020. -Attempted to provided insight into [facility] issues with pt.  A referral for [facility]- PT/OT, nursing was placed in July.       Pt initially requested [facility], however they were unable to accept him.  Two other [place] agencies declined pt's insurance.  The referral was then sent to [facility], but declined.  [facility] contacted office in regards to HH/rehab services, but unable to speak with a representative when call was returned. -will reach out to any other [facility] agencies to see if they will accept  pt.  Pt encouraged to answer his phone in the future. -continue wellbutrin 100 mg, lisinopril 40 mg, norvasc 10 mg, hydralazine 10 mg.  Anosmia --likely a result of CVA as occurred after acute thalamic hemorrhage in Jan 2020 -less likely 2/2 COVID-19 given duration, however pt advised several times on free COVID community testing sites  Follow Up Instructions: F/u in 1 month  99441 5-10 [id number] 11-20 9443 21-30 I did not refer this patient for an OV in the next 24 hours for this/these issue(s).  I discussed the  assessment and treatment plan with the patient. The patient was provided an opportunity to ask questions and all were answered. The patient agreed with the plan and demonstrated an understanding of the instructions.   The patient was advised to call back or seek an in-person evaluation if the symptoms worsen or if the condition fails to improve as anticipated.  I provided 32 minutes of non-face-to-face time during this encounter.   Billie Ruddy, MD   This note is not being shared with the patient for the following reason: To prevent harm (release of this note would result in harm to the life or physical safety of the patient or another).

## 2019-07-06 ENCOUNTER — Other Ambulatory Visit: Payer: Self-pay | Admitting: Family Medicine

## 2019-07-06 DIAGNOSIS — F5101 Primary insomnia: Secondary | ICD-10-CM

## 2019-07-06 NOTE — Telephone Encounter (Signed)
Dr. Volanda Napoleon Pt

## 2019-07-06 NOTE — Telephone Encounter (Signed)
Pt had a Telemedicine appointment yesterday with Dr Volanda Napoleon regarding his [name]

## 2019-07-09 ENCOUNTER — Telehealth: Payer: Self-pay | Admitting: Family Medicine

## 2019-07-09 NOTE — Telephone Encounter (Signed)
Message Routed to PCP CMA 

## 2019-07-09 NOTE — Telephone Encounter (Signed)
[name] with [facility] called and is requesting patient virtual notes from 07/04/19 to send to pt insurance. Please call her back at [phone].

## 2019-07-11 NOTE — Telephone Encounter (Signed)
Left detailed  message for [place] with [facility] regarding pt approved orders as requested.

## 2019-08-07 ENCOUNTER — Other Ambulatory Visit: Payer: Self-pay | Admitting: Family Medicine

## 2019-08-07 ENCOUNTER — Telehealth: Payer: Self-pay | Admitting: Family Medicine

## 2019-08-07 DIAGNOSIS — I10 Essential (primary) hypertension: Secondary | ICD-10-CM

## 2019-08-07 DIAGNOSIS — G4700 Insomnia, unspecified: Secondary | ICD-10-CM

## 2019-08-07 NOTE — Telephone Encounter (Signed)
Pt was advised that he needs an order to go back to rehab/ Pt states [facility] and wants to continue to et out of his wheelchair/ please advise /Pt asked if this can expedited

## 2019-08-08 ENCOUNTER — Encounter: Payer: Self-pay | Admitting: Physical Medicine & Rehabilitation

## 2019-08-08 ENCOUNTER — Encounter: Payer: Medicare Other | Attending: Physical Medicine & Rehabilitation | Admitting: Physical Medicine & Rehabilitation

## 2019-08-08 ENCOUNTER — Other Ambulatory Visit: Payer: Self-pay

## 2019-08-08 DIAGNOSIS — I10 Essential (primary) hypertension: Secondary | ICD-10-CM

## 2019-08-08 DIAGNOSIS — F17200 Nicotine dependence, unspecified, uncomplicated: Secondary | ICD-10-CM

## 2019-08-08 DIAGNOSIS — I69851 Hemiplegia and hemiparesis following other cerebrovascular disease affecting right dominant side: Secondary | ICD-10-CM | POA: Diagnosis not present

## 2019-08-08 DIAGNOSIS — D62 Acute posthemorrhagic anemia: Secondary | ICD-10-CM

## 2019-08-08 DIAGNOSIS — I69819 Unspecified symptoms and signs involving cognitive functions following other cerebrovascular disease: Secondary | ICD-10-CM | POA: Diagnosis not present

## 2019-08-08 DIAGNOSIS — K2970 Gastritis, unspecified, without bleeding: Secondary | ICD-10-CM | POA: Diagnosis not present

## 2019-08-08 DIAGNOSIS — G4700 Insomnia, unspecified: Secondary | ICD-10-CM

## 2019-08-08 DIAGNOSIS — F5101 Primary insomnia: Secondary | ICD-10-CM

## 2019-08-08 DIAGNOSIS — K449 Diaphragmatic hernia without obstruction or gangrene: Secondary | ICD-10-CM

## 2019-08-08 MED ORDER — QUETIAPINE FUMARATE 100 MG PO TABS: 100.0000 mg | ORAL_TABLET | Freq: Every day | ORAL | 1 refills | Status: DC

## 2019-08-08 MED ORDER — ZOLPIDEM TARTRATE 5 MG PO TABS: 5.0000 mg | ORAL_TABLET | Freq: Every day | ORAL | 1 refills | Status: DC

## 2019-08-08 NOTE — Progress Notes (Signed)
Subjective:    Patient ID: [name], male    DOB: March 30, 1951, 69 y.o.   MRN: [id number]  HPI   Due to national recommendations of social distancing because of COVID 88, an audio/video tele-health visit is felt to be the most appropriate encounter for this patient at this time. See MyChart message from today for the patient's consent to a tele-health encounter with [place]. This is a follow up telephone visit for the patient who is at home. MD is at office.    I am meeting over the phone with [name] today regarding his prior thalamic hemorrhage. I last saw him in April.  He reports that he is having ongoing problems with sleep.  His primary care doctor will no longer prescribe his meds apparently.  We have talked about getting in the outpatient therapy at our last visit but he never followed up.  I also ordered an MRI at that time of his shoulder which she never followed through with.  Sleep remains a problem and he answers anything "I can increase".  He also states that he is having pain in his right leg which affects his balance and he feels that he really needs some therapy to improve the strength and range of motion.  Pain Inventory Average Pain 9 Pain Right Now 9 My pain is sharp and aching  In the last 24 hours, has pain interfered with the following? General activity 9 Relation with others 9 Enjoyment of life 9 What TIME of day is your pain at its worst? all Sleep (in general) Poor  Pain is worse with: walking, bending and some activites Pain improves with: rest Relief from Meds: na  Mobility use a walker ability to climb steps?  yes do you drive?  no use a wheelchair transfers alone  Function retired I need assistance with the following:  meal prep and household duties  Neuro/Psych weakness trouble walking  Prior Studies Any changes since last visit?  no  Physicians involved in your care Any changes since  last visit?  no Primary care [name], MD   Family History  Problem Relation Age of Onset  . Alzheimer's disease Father   . Cancer Mother        Lymph node  . Heart failure Brother 45       Transplant 13 years ago  . CAD Brother   . Heart disease Sister 35       MI   Social History   Socioeconomic History  . Marital status: Legally Separated    Spouse name: Not on file  . Number of children: 2  . Years of education: Not on file  . Highest education level: Not on file  Occupational History  . Not on file  Tobacco Use  . Smoking status: Current Every Day Smoker    Packs/day: 0.50    Years: 50.00    Pack years: 25.00    Types: Cigarettes  . Smokeless tobacco: Never Used  Substance and Sexual Activity  . Alcohol use: Yes    Alcohol/week: 0.0 standard drinks    Comment: occasionally  . Drug use: No    Frequency: 5.0 times per week  . Sexual activity: Yes  Other Topics Concern  . Not on file  Social History Narrative   One living child .  One murdered.  Lives with girlfriend.     Social Determinants of Health   Financial Resource Strain:   .  Difficulty of Paying Living Expenses: Not on file  Food Insecurity:   . Worried About Charity fundraiser in the Last Year: Not on file  . Ran Out of Food in the Last Year: Not on file  Transportation Needs:   . Lack of Transportation (Medical): Not on file  . Lack of Transportation (Non-Medical): Not on file  Physical Activity:   . Days of Exercise per Week: Not on file  . Minutes of Exercise per Session: Not on file  Stress:   . Feeling of Stress : Not on file  Social Connections:   . Frequency of Communication with Friends and Family: Not on file  . Frequency of Social Gatherings with Friends and Family: Not on file  . Attends Religious Services: Not on file  . Active Member of Clubs or Organizations: Not on file  . Attends Archivist Meetings: Not on file  . Marital Status: Not on file   Past  Surgical History:  Procedure Laterality Date  . APPENDECTOMY    . BIOPSY  01/11/2018   Procedure: BIOPSY;  Surgeon: Gatha Mayer, MD;  Location: [facility] ENDOSCOPY;  Service: Endoscopy;;  . COLONOSCOPY    . COLONOSCOPY N/A 10/26/2015   Procedure: COLONOSCOPY;  Surgeon: Doran Stabler, MD;  Location: [facility] ENDOSCOPY;  Service: Endoscopy;  Laterality: N/A;  . ESOPHAGOGASTRODUODENOSCOPY    . ESOPHAGOGASTRODUODENOSCOPY N/A 10/26/2015   Procedure: ESOPHAGOGASTRODUODENOSCOPY (EGD);  Surgeon: Doran Stabler, MD;  Location[facility] ENDOSCOPY;  Service: Endoscopy;  Laterality: N/A;  . ESOPHAGOGASTRODUODENOSCOPY (EGD) WITH PROPOFOL N/A 01/11/2018   Procedure: ESOPHAGOGASTRODUODENOSCOPY (EGD) WITH PROPOFOL;  Surgeon: Gatha Mayer, MD;  Location: [facility] ENDOSCOPY;  Service: Endoscopy;  Laterality: N/A;  . INGUINAL HERNIA REPAIR Right 11/04/2014   Procedure: RIGHT INGUINAL HERNIA REPAIR WITH MESH;  Surgeon: Jackolyn Confer, MD;  Location: [place];  Service: General;  Laterality: Right;  . MASS EXCISION N/A 11/04/2014   Procedure: REMOVAL OF RIGHT GROIN SOFT TISSUE MASS;  Surgeon: Jackolyn Confer, MD;  Location: [place];  Service: General;  Laterality: N/A;  . Multiple abdominal surgeries     total of 13  . PROSTATECTOMY    . SMALL INTESTINE SURGERY     Past Medical History:  Diagnosis Date  . Arthritis   . Cameron ulcer, chronic 01/11/2018  . Chronic back pain   . GERD (gastroesophageal reflux disease)    uses Baking Soda  . GIB (gastrointestinal bleeding) 2012  . Glaucoma    right eye  . Headache    occasionally  . Hepatitis C   . Hiatal hernia with gastroesophageal reflux disease and esophagitis 01/05/2018  . History of blood transfusion    no abnormal reaction noted  . History of gout    doesn't take any meds  . Hyperlipidemia    not on any meds  . Hypertension    takes Amlodipine and Lisinopril daily  . Insomnia    takes Ambien nightly  . Ischemic colitis ([place]) 2012  . Joint pain   . Nocturia   .  Numbness    both legs occasionally  . PONV (postoperative nausea and vomiting)   . Prostate cancer ([place])   . Shortness of breath dyspnea    rarely but when notices he can be lying/sitting/exertion.Dr.Hochrein is aware per pt  . Stroke ([place]) 2016   . Urinary frequency   . Urinary urgency    There were no vitals taken for this visit.  Opioid Risk Score:  Fall Risk Score:  `1  Depression screen PHQ 2/9  Depression screen [facility] 2/9 10/31/2018 10/18/2017 08/29/2017 07/11/2017 06/23/2017 05/27/2016 12/08/2015  Decreased Interest 0 0 1 0 0 0 0  Down, Depressed, Hopeless 0 0 1 0 0 0 0  PHQ - 2 Score 0 0 2 0 0 0 0  Altered sleeping - - 3 - - - 0  Tired, decreased energy - - 2 - - - 0  Change in appetite - - 3 - - - 0  Feeling bad or failure about yourself  - - 1 - - - 0  Trouble concentrating - - 3 - - - 0  Moving slowly or fidgety/restless - - 3 - - - 0  Suicidal thoughts - - 0 - - - 0  PHQ-9 Score - - 17 - - - 0  Difficult doing work/chores - - - - - - Not difficult at all  Some recent data might be hidden     Review of Systems  Musculoskeletal: Positive for gait problem.       Leg pain  Neurological: Positive for weakness.  All other systems reviewed and are negative.           Assessment & Plan:  1. 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 -- probably needs to be seen in outpt therapy again. However I need to see him in person before referring especially with complaints of right leg pain.  2. History of chronic back pain/headaches/pain Management: Tylenol as needed for now -Hx of left shoulder pain. We injected shoulder   -pt never went for left shoulder MRI -continue prn flexeril 3. Mood:Wellbutrin  4.HTN: Continue amlodipine and lisinopril--as well as hydralazine. Blood pressure fairly well controlled at present  5. Acute on chronic blood loss anemia/HH with  gastritis: per outpt GI/primary 6. Chronic Insomnia:             -resume Seroquel and Ambien.    I will write his sleeping medications.              -100mg  and 5mg  respectively              -discussed sleep hygiene, tobacco effects. No cigarettes before bed!! 7.  Tobacco dependence: - continue Wellbutrin  100 mg twice daily  11 minutes of tele-visit time was spent with this patient today. Follow up in 2  months

## 2019-08-09 NOTE — Telephone Encounter (Signed)
Left a detailed message with Dr Volanda Napoleon recommendations regarding [name] wanting to go back to rehab, [name] has an in office appointment on 08/15/2019 at 11.30 am and Dr Volanda Napoleon will be assessing [name] at the visit, Advised [name] to call back our office with any questions

## 2019-08-09 NOTE — Telephone Encounter (Signed)
Will wait unitl pt seen at appointment next wk to assess needs.

## 2019-08-09 NOTE — Telephone Encounter (Signed)
Ok for the orders 

## 2019-08-15 ENCOUNTER — Ambulatory Visit: Payer: Medicare Other | Admitting: Family Medicine

## 2019-09-10 ENCOUNTER — Other Ambulatory Visit: Payer: Self-pay

## 2019-09-10 NOTE — Patient Outreach (Signed)
[facility]) Care Management  09/10/2019  [name] [facility] [name], [name] [id number]   Medication Adherence call to Mr. [name] HIPPA Compliant Voice message left with a call back number. [name] is showing past due on Lisinopril 40 mg under [place].   [facility] Management Direct Dial [phone]  Fax [phone] [email or web address]

## 2019-09-11 ENCOUNTER — Telehealth: Payer: Self-pay | Admitting: *Deleted

## 2019-09-11 NOTE — Telephone Encounter (Signed)
Patient called after hours line. Patient reports he has an appointment at 9:30 March 3rd. He is currently not able to taste or smell. His mouth is numb also. This has been going on for 5 to 6 months

## 2019-09-11 NOTE — Telephone Encounter (Signed)
Please advise 

## 2019-09-12 NOTE — Telephone Encounter (Signed)
Pt should be seen at [facility] or ED.

## 2019-09-13 NOTE — Telephone Encounter (Signed)
Called pt on both phone numbers listed in pt chart left  detailed message on the mobile number advised pt to go to UC or ED per Dr Volanda Napoleon advise

## 2019-09-14 NOTE — Telephone Encounter (Signed)
Second attempted to reach pt no success, left a detailed voicemail advising pt to go to UC or to the ED for further evaluation

## 2019-09-19 ENCOUNTER — Encounter: Payer: Self-pay | Admitting: Family Medicine

## 2019-09-19 ENCOUNTER — Ambulatory Visit (INDEPENDENT_AMBULATORY_CARE_PROVIDER_SITE_OTHER): Payer: Medicare Other | Admitting: Family Medicine

## 2019-09-19 ENCOUNTER — Other Ambulatory Visit: Payer: Self-pay

## 2019-09-19 ENCOUNTER — Telehealth: Payer: Self-pay

## 2019-09-19 ENCOUNTER — Telehealth: Payer: Self-pay | Admitting: Family Medicine

## 2019-09-19 VITALS — BP 130/84 | HR 84 | Temp 98.7°F | Wt 210.0 lb

## 2019-09-19 DIAGNOSIS — F419 Anxiety disorder, unspecified: Secondary | ICD-10-CM

## 2019-09-19 DIAGNOSIS — I69118 Other symptoms and signs involving cognitive functions following nontraumatic intracerebral hemorrhage: Secondary | ICD-10-CM

## 2019-09-19 DIAGNOSIS — I69151 Hemiplegia and hemiparesis following nontraumatic intracerebral hemorrhage affecting right dominant side: Secondary | ICD-10-CM

## 2019-09-19 DIAGNOSIS — R531 Weakness: Secondary | ICD-10-CM | POA: Diagnosis not present

## 2019-09-19 DIAGNOSIS — F329 Major depressive disorder, single episode, unspecified: Secondary | ICD-10-CM

## 2019-09-19 DIAGNOSIS — Z720 Tobacco use: Secondary | ICD-10-CM | POA: Diagnosis not present

## 2019-09-19 DIAGNOSIS — I10 Essential (primary) hypertension: Secondary | ICD-10-CM

## 2019-09-19 DIAGNOSIS — F17210 Nicotine dependence, cigarettes, uncomplicated: Secondary | ICD-10-CM | POA: Diagnosis not present

## 2019-09-19 DIAGNOSIS — F32A Depression, unspecified: Secondary | ICD-10-CM

## 2019-09-19 DIAGNOSIS — F5104 Psychophysiologic insomnia: Secondary | ICD-10-CM | POA: Diagnosis not present

## 2019-09-19 DIAGNOSIS — Z8673 Personal history of transient ischemic attack (TIA), and cerebral infarction without residual deficits: Secondary | ICD-10-CM

## 2019-09-19 LAB — COMPREHENSIVE METABOLIC PANEL
ALT: 9 U/L (ref 0–53)
AST: 9 U/L (ref 0–37)
Albumin: 4.1 g/dL (ref 3.5–5.2)
Alkaline Phosphatase: 106 U/L (ref 39–117)
BUN: 10 mg/dL (ref 6–23)
CO2: 25 mEq/L (ref 19–32)
Calcium: 9.1 mg/dL (ref 8.4–10.5)
Chloride: 107 mEq/L (ref 96–112)
Creatinine, Ser: 0.99 mg/dL (ref 0.40–1.50)
GFR: 90.3 mL/min (ref >60.00)
Glucose, Bld: 79 mg/dL (ref 70–99)
Potassium: 4.5 mEq/L (ref 3.5–5.1)
Sodium: 140 mEq/L (ref 135–145)
Total Bilirubin: 0.3 mg/dL (ref 0.2–1.2)
Total Protein: 6.9 g/dL (ref 6.0–8.3)

## 2019-09-19 LAB — CBC
HCT: 24.9 % — ABNORMAL LOW (ref 39.0–52.0)
Hemoglobin: 7.1 g/dL — CL (ref 13.0–17.0)
MCHC: 28.4 g/dL — ABNORMAL LOW (ref 30.0–36.0)
MCV: 61 fl — ABNORMAL LOW (ref 78.0–100.0)
Platelets: 346 10*3/uL (ref 150.0–400.0)
RBC: 4.09 Mil/uL — ABNORMAL LOW (ref 4.22–5.81)
RDW: 19.6 % — ABNORMAL HIGH (ref 11.5–15.5)
WBC: 4.6 10*3/uL (ref 4.0–10.5)

## 2019-09-19 LAB — TSH: TSH: 0.7 u[IU]/mL (ref 0.35–4.50)

## 2019-09-19 LAB — FOLATE: Folate: 8.5 ng/mL (ref >5.9)

## 2019-09-19 LAB — T4, FREE: Free T4: 0.75 ng/dL (ref 0.60–1.60)

## 2019-09-19 LAB — VITAMIN B12: Vitamin B-12: 247 pg/mL (ref 211–911)

## 2019-09-19 MED ORDER — LISINOPRIL 40 MG PO TABS: 40.0000 mg | ORAL_TABLET | Freq: Every day | ORAL | 3 refills | Status: DC

## 2019-09-19 MED ORDER — AMLODIPINE BESYLATE 10 MG PO TABS: 10.0000 mg | ORAL_TABLET | Freq: Every day | ORAL | 3 refills | Status: DC

## 2019-09-19 MED ORDER — BUPROPION HCL 100 MG PO TABS: 100.0000 mg | ORAL_TABLET | Freq: Two times a day (BID) | ORAL | 3 refills | Status: DC

## 2019-09-19 MED ORDER — HYDRALAZINE HCL 10 MG PO TABS: 10.0000 mg | ORAL_TABLET | Freq: Every day | ORAL | 3 refills | Status: DC

## 2019-09-19 NOTE — Progress Notes (Addendum)
Subjective:    Patient ID: [name], male    DOB: 1950/12/24, 69 y.o.   MRN: [id number]  No chief complaint on file.   HPI Pt is a 69 yo male with pmh sig for CVA- thalamic hemorrhage, HTN, anemia, GIB 2/2 ulcer,  OA, h/o prostate cancer, tobacco use, HLD, insomnia, gout seen today for f/u.   Endorses loss of taste and smell x months s/p CVA.  Pt currnetly using a power wheelchair to move around.  States at home holds on to the wall as his walker is broken.  Pt would like to go back to outpatient rehab.  States his R leg feels heavy and hurts when he tries to walk.  Pt denies back pain, numbness/tinging in LEs, knee pain.  Still having sleep issues.  On Seroquel 100 mg and ambien 5 mg by Dr. Naaman Plummer.  May get a few hours of sleep per night.  Smoking half pack per day.  Pt notes feeling depressed at times.  Frustrated he is not up walking and that his kids do not come to visit.  Pt states he is at home alone as the roommate did not work out.  Pt cooking his meals.  Drinking 1 L of soda per day.  Past Medical History:  Diagnosis Date  . Arthritis   . Cameron ulcer, chronic 01/11/2018  . Chronic back pain   . GERD (gastroesophageal reflux disease)    uses Baking Soda  . GIB (gastrointestinal bleeding) 2012  . Glaucoma    right eye  . Headache    occasionally  . Hepatitis C   . Hiatal hernia with gastroesophageal reflux disease and esophagitis 01/05/2018  . History of blood transfusion    no abnormal reaction noted  . History of gout    doesn't take any meds  . Hyperlipidemia    not on any meds  . Hypertension    takes Amlodipine and Lisinopril daily  . Insomnia    takes Ambien nightly  . Ischemic colitis ([place]) 2012  . Joint pain   . Nocturia   . Numbness    both legs occasionally  . PONV (postoperative nausea and vomiting)   . Prostate cancer ([place])   . Shortness of breath dyspnea    rarely but when notices he can be lying/sitting/exertion.Dr.Hochrein is aware per pt  .  Stroke ([place]) 2016   . Urinary frequency   . Urinary urgency     Allergies  Allergen Reactions  . Penicillins Anaphylaxis    Has patient had a PCN reaction causing immediate rash, facial/tongue/throat swelling, SOB or lightheadedness with hypotension: Yes Has patient had a PCN reaction causing severe rash involving mucus membranes or skin necrosis: No Has patient had a PCN reaction that required hospitalization: No Has patient had a PCN reaction occurring within the last 10 years: No If all of the above answers are "NO", then may proceed with Cephalosporin use..  . Sudafed [Pseudoephedrine] Other (See Comments)    Heart "races"    ROS General: Denies fever, chills, night sweats, changes in weight, changes in appetite HEENT: Denies headaches, ear pain, changes in vision, rhinorrhea, sore throat  +loss of taste and smell CV: Denies CP, palpitations, SOB, orthopnea Pulm: Denies SOB, cough, wheezing GI: Denies abdominal pain, nausea, vomiting, diarrhea, constipation GU: Denies dysuria, hematuria, frequency, vaginal discharge Msk: Denies muscle cramps, joint pains Neuro: Denies numbness, tingling  +LE weakness Skin: Denies rashes, bruising Psych: Denies depression, anxiety, hallucinations  +insomnia, depressed mood  Objective:    Blood pressure 130/84, pulse 84, temperature 98.7 F (37.1 C), weight 210 lb (95.3 kg), SpO2 99 %.   Gen. Pleasant, well-nourished, in no distress, normal affect   HEENT: [place]/AT, face symmetric, conjunctiva clear, no scleral icterus, PERRLA, EOMI, nares patent without drainage Lungs: no accessory muscle use, CTAB, no wheezes or rales Cardiovascular: RRR, 2/6 murmur best heard RUSB, no peripheral edema Abdomen: BS present, soft, NT/ND Musculoskeletal: No deformities, no cyanosis or clubbing.  LE strength 4/5 b/l.  RUE strength 4/5 R>L  Neuro:  A&Ox3, CN II-XII intact.  Sitting in a power wheelchair.  Able to stand without difficulty.  Moving LEs.  Gait  not assessed. Skin:  Warm, no lesions/ rash   Wt Readings from Last 3 Encounters:  09/19/19 210 lb (95.3 kg)  08/08/19 198 lb (89.8 kg)  08/28/18 211 lb (95.7 kg)    Lab Results  Component Value Date   WBC 5.7 08/14/2018   HGB 8.5 (L) 08/14/2018   HCT 29.3 (L) 08/14/2018   PLT 417 (H) 08/14/2018   GLUCOSE 100 (H) 08/14/2018   CHOL 102 07/22/2018   TRIG 108 07/22/2018   HDL 35 (L) 07/22/2018   LDLCALC 45 07/22/2018   ALT 19 07/27/2018   AST 17 07/27/2018   NA 138 08/14/2018   K 3.9 08/14/2018   CL 109 08/14/2018   CREATININE 1.04 08/14/2018   BUN 18 08/14/2018   CO2 19 (L) 08/14/2018   TSH 1.510 01/11/2018   INR 1.23 07/20/2018   HGBA1C 4.7 (L) 07/22/2018    Assessment/Plan:  History of CVA (cerebrovascular accident)  -LE weakness, cognitive deficit, right hemiparesis s/p left thalamic hemorrhage -Advised loss of taste/smell likely residual effects of stroke. -Referral placed to restart outpatient therapy - Plan: AMB referral to rehabilitation, For home use only DME Walker  Essential hypertension  -controlled -continue current meds:  norvasc 10 mg, hydralazine 10 mg, lisinopril 40 mg - Plan: Comprehensive metabolic panel, amLODipine (NORVASC) 10 MG tablet, hydrALAZINE (APRESOLINE) 10 MG tablet, lisinopril (ZESTRIL) 40 MG tablet  Chronic insomnia  -not improving -Discussed sleep hygiene.  Pt encouraged to decrease nicotine use and caffeine intake -sleep meds per PM&R -Seroquel 100 mg and Ambien 5 mg - Plan: TSH, T4, Free  Weakness  - Plan: Comprehensive metabolic panel, CBC (no diff), Folate, Vitamin B12, TSH, T4, Free  Anxiety and depression --PHQ 9 score 14 -GAD 7 score 14 -discussed counseling.   -restart Wellbutrin  Tobacco abuse -smoking 1/2 ppd -Smoking cessation counseling greater than 3 minutes, less than 10 minutes -We will restart Wellbutrin -Continue to monitor  F/u in 1-2 months  [name], MD

## 2019-09-19 NOTE — Telephone Encounter (Signed)
Spoke with pt notified that his Hemoglobin level is critically low, pt was advised to to to the ED for infusion per Dr Volanda Napoleon recommendation. Pt denies any symptoms related to low Hemoglobin state that he has no transportation today until tomorrow, states that he will go in the morning, advised pt to call Ambulance if he experience any symptoms related to low Hemoglobin,pt verbalized understanding

## 2019-09-19 NOTE — Patient Instructions (Addendum)
Insomnia Insomnia is a sleep disorder that makes it difficult to fall asleep or stay asleep. Insomnia can cause fatigue, low energy, difficulty concentrating, mood swings, and poor performance at work or school. There are three different ways to classify insomnia:  Difficulty falling asleep.  Difficulty staying asleep.  Waking up too early in the morning. Any type of insomnia can be long-term (chronic) or short-term (acute). Both are common. Short-term insomnia usually lasts for three months or less. Chronic insomnia occurs at least three times a week for longer than three months. What are the causes? Insomnia may be caused by another condition, situation, or substance, such as:  Anxiety.  Certain medicines.  Gastroesophageal reflux disease (GERD) or other gastrointestinal conditions.  Asthma or other breathing conditions.  Restless legs syndrome, sleep apnea, or other sleep disorders.  Chronic pain.  Menopause.  Stroke.  Abuse of alcohol, tobacco, or illegal drugs.  Mental health conditions, such as depression.  Caffeine.  Neurological disorders, such as Alzheimer's disease.  An overactive thyroid (hyperthyroidism). Sometimes, the cause of insomnia may not be known. What increases the risk? Risk factors for insomnia include:  Gender. Women are affected more often than men.  Age. Insomnia is more common as you get older.  Stress.  Lack of exercise.  Irregular work schedule or working night shifts.  Traveling between different time zones.  Certain medical and mental health conditions. What are the signs or symptoms? If you have insomnia, the main symptom is having trouble falling asleep or having trouble staying asleep. This may lead to other symptoms, such as:  Feeling fatigued or having low energy.  Feeling nervous about going to sleep.  Not feeling rested in the morning.  Having trouble concentrating.  Feeling irritable, anxious, or depressed. How  is this diagnosed? This condition may be diagnosed based on:  Your symptoms and medical history. Your health care provider may ask about: ? Your sleep habits. ? Any medical conditions you have. ? Your mental health.  A physical exam. How is this treated? Treatment for insomnia depends on the cause. Treatment may focus on treating an underlying condition that is causing insomnia. Treatment may also include:  Medicines to help you sleep.  Counseling or therapy.  Lifestyle adjustments to help you sleep better. Follow these instructions at home: Eating and drinking   Limit or avoid alcohol, caffeinated beverages, and cigarettes, especially close to bedtime. These can disrupt your sleep.  Do not eat a large meal or eat spicy foods right before bedtime. This can lead to digestive discomfort that can make it hard for you to sleep. Sleep habits   Keep a sleep diary to help you and your health care provider figure out what could be causing your insomnia. Write down: ? When you sleep. ? When you wake up during the night. ? How well you sleep. ? How rested you feel the next day. ? Any side effects of medicines you are taking. ? What you eat and drink.  Make your bedroom a dark, comfortable place where it is easy to fall asleep. ? Put up shades or blackout curtains to block light from outside. ? Use a white noise machine to block noise. ? Keep the temperature cool.  Limit screen use before bedtime. This includes: ? Watching TV. ? Using your smartphone, tablet, or computer.  Stick to a routine that includes going to bed and waking up at the same times every day and night. This can help you fall asleep faster. Consider   making a quiet activity, such as reading, part of your nighttime routine.  Try to avoid taking naps during the day so that you sleep better at night.  Get out of bed if you are still awake after 15 minutes of trying to sleep. Keep the lights down, but try reading or  doing a quiet activity. When you feel sleepy, go back to bed. General instructions  Take over-the-counter and prescription medicines only as told by your health care provider.  Exercise regularly, as told by your health care provider. Avoid exercise starting several hours before bedtime.  Use relaxation techniques to manage stress. Ask your health care provider to suggest some techniques that may work well for you. These may include: ? Breathing exercises. ? Routines to release muscle tension. ? Visualizing peaceful scenes.  Make sure that you drive carefully. Avoid driving if you feel very sleepy.  Keep all follow-up visits as told by your health care provider. This is important. Contact a health care provider if:  You are tired throughout the day.  You have trouble in your daily routine due to sleepiness.  You continue to have sleep problems, or your sleep problems get worse. Get help right away if:  You have serious thoughts about hurting yourself or someone else. If you ever feel like you may hurt yourself or others, or have thoughts about taking your own life, get help right away. You can go to your nearest emergency department or call:  Your local emergency services (911 in the [place]  A suicide crisis helpline, such as the [facility] at (678)748-2202. This is open 24 hours a day. Summary  Insomnia is a sleep disorder that makes it difficult to fall asleep or stay asleep.  Insomnia can be long-term (chronic) or short-term (acute).  Treatment for insomnia depends on the cause. Treatment may focus on treating an underlying condition that is causing insomnia.  Keep a sleep diary to help you and your health care provider figure out what could be causing your insomnia. This information is not intended to replace advice given to you by your health care provider. Make sure you discuss any questions you have with your health care provider. Document  Revised: 06/17/2017 Document Reviewed: 04/14/2017 Elsevier Patient Education  2020 [place] After a Stroke, Adult A stroke causes damage to the brain cells, which can affect your ability to walk, talk, or remember things. The impact of a stroke is different for everyone, and so is recovery. Some people have progress during the first few days after treatment. Others may take weeks or longer to make progress. Stroke rehabilitation includes a variety of treatments to help you recover and promote your independence after a stroke. You may not be able do everything that you did before the stroke, but you can learn ways to manage your lifestyle and be as independent as possible. Rehabilitation will start as soon as you are able to participate after your stroke, and it involves care from a team that may include:  Family and friends. Your loved ones know you best and can be very helpful in your recovery.  Physicians.  Nurses.  Physical and occupational therapists.  Speech-language therapists.  A nutritionist.  A psychologist.  A Education officer, museum. Keep open communication with all members of your care team. Share your medical records if needed, and take notes about each provider's recommendations. What is physical therapy? Physical therapists (PTs) help you to improve your coordination, balance, and muscle strength.  Physical therapy may involve:  Range of motion exercises.  Help to move between lying, sitting, and standing positions.  Walking with a cane or walker, if needed.  Help using stairs. What is occupational therapy? Occupational therapists (OTs) help you rebuild your ability to do everyday tasks, such as brushing your teeth, going to the bathroom, eating, and getting dressed. Occupational therapy may also help with:  Vision. Visual scanning is a technique that is used to prevent falls.  Memory and cognitive training. This therapy includes problem-solving  techniques and relearning tasks like making a phone call.  Fine muscle movements such as buttoning a shirt or picking up small objects. What is speech therapy? Speech-language therapists help you communicate. After a stroke, you may have problems understanding what people are saying, or you may have trouble writing, speaking, or finding the right word for what you want to say. You may also need speech therapy if you have difficulty swallowing while eating and drinking. Examples of speech-language therapies include:  Techniques to strengthen muscles used in swallowing.  Naming objects or describing pictures. This helps retrain the brain to recognize and remember words.  Exercises to strengthen the muscles involved in talking, including your tongue and lips.  Exercises to retrain your brain in understanding what you read and hear. How often will I need therapy? Therapy will begin as soon as you are able to participate, which is often within the first few days after a stroke. Sessions will be frequent at first. For example, you may have therapy 2-3 hours a day on most days of the week during the first few months. The intensity depends on the type and severity of your stroke. You may need therapy for several months. Therapy may take place in the hospital, at a rehabilitation center, or in your home. Are there any side effects of therapy? Therapy is safe and is usually well-tolerated. You may feel physically and mentally tired after therapy, especially during the first few weeks. Rest before therapy sessions if you need to so you can get the most out of your rehabilitation. Follow these instructions at home:  Involve your family and friends in your recovery, if possible. Having another person to encourage you is beneficial.  Follow instructions from your speech-language therapist, nutritionist, or health care provider about what you can safely eat and drink. Eat healthy foods. If your ability to  swallow was affected by the stroke, you may need to take steps to avoid choking, such as: ? Taking small bites when eating. ? Eating foods that are soft or pureed. ? Drinking liquids that have been thickened.  Maintain social connections and interactions with friends, family, and community groups. This is an important part of your recovery. Communication challenges and physical challenges may cause you to feel isolated after a stroke.  Consider joining a support group that allows you to talk about the impact of stroke on your life. A psychologist or counselor may be recommended. Your emotional recovery from stroke is just as important as your physical recovery.  Keep all follow-up visits as told by your health care providers. This is important. Summary  Stroke rehabilitation includes a variety of treatments to help you recover and promote your independence after a stroke.  Rehabilitation will start as soon as you are able to participate after your stroke, and it includes care from a team of experts.  The intensity of therapy depends on the type and severity of your stroke. You may need therapy for several  months. This information is not intended to replace advice given to you by your health care provider. Make sure you discuss any questions you have with your health care provider. Document Revised: 10/25/2018 Document Reviewed: 07/06/2016 Elsevier Patient Education  [place].  Sensory Loss After a Stroke A stroke can damage parts of your brain that control your body's normal functions, including your senses. As a result, you may have sensory loss, such as trouble seeing, tasting, swallowing, or feeling touch or pressure sensations. You may have problems feeling temperature changes or moving your body in a coordinated way. You may also perceive smell differently. You may have problems with all of your senses or only some of them. By following a treatment plan, you may recover lost  senses and manage the changes to your lifestyle. What are treatment therapies for sensory loss? You may have a combination of therapies for sensory loss.  Physical therapy. This may include: ? Exercises to improve your coordination and balance. ? Exercises that combine touch, balance, and movement (sensorimotor training). ? Movements to relieve pressure while you are sitting or lying (mobility training). ? Splints or braces to protect parts of your body that you cannot feel.  Devices to help with blood flow (circulation) and to help stimulate nerves in affected parts of your body.  Speech therapy to help you swallow safely.  Occupational therapy to help you with everyday tasks. This may include: ? Exercises or devices to improve your vision. ? Exercises to help you increase your touch perception. These may include feeling objects of different sizes and textures while your eyes are closed. ? Techniques for moving safely in your environment.  Prescription eye glasses for vision loss.  Hearing aids for hearing loss. Follow these instructions at home: Safety   Your risk of falling is higher after a stroke. You may have difficulty feeling your legs and feet or coordinating your movements. To lower your risk of falling: ? Use devices to help you move around (assistive devices), such as a wheelchair or walker, as directed by your health care team. ? Wear prescription eye glasses at all times when moving around. ? Use lights to help you see in the dark. ? Use grab bars in bathrooms and handrails in stairways. ? Keep walkways clear in your home by removing rugs, cords, and clutter from the floor.  Your risk of getting burned is higher after a stroke. To lower your risk of burns: ? Test the water temperature before taking a bath or washing your hands. ? Allow hot foods to cool slightly before eating. ? Use potholders when handling hot pans.  When using sharp objects, such as scissors or  knives, use your healthy hand. Do not handle sharp objects with your hand that was affected by your stroke, if this applies. Activity  Return to your normal activities as told by your health care provider. Ask your health care provider what activities are safe for you.  Avoid spending too much time sitting or lying down. If you must be in a chair or bed, change positions regularly.  You may have problems doing your normal activities. Ask your health care provider about getting extra help at home. Eating and drinking   You may have problems swallowing food and fluids after a stroke. The problems can be due to: ? Changes in your muscles. ? Sensory changes, such as:  Difficulty feeling the consistency or size of a piece of food in your mouth.  Inability to  feel the need to clear your throat.  You may need to: ? Take smaller bites and chew thoroughly. Make sure you have swallowed all the food in your mouth before you take another bite. ? Sit in an upright position when eating or drinking. ? Avoid distractions while eating or drinking. ? Stay upright for 30-45 minutes after eating. ? Change the texture of some things that you eat and drink. This may include:  Changing foods to a smooth, mashed consistency (puree).  Thickening liquids.  Follow instructions from your health care provider about eating and drinking restrictions. General instructions  Wear arm or leg braces as told by your health care team.  Get help at home as needed. You may need help getting dressed, bathing, using the bathroom, eating, or doing other activities.  Keep all follow-up visits as told by your health care providers. This is important. Summary  It is common to have sensory loss after a stroke. Sensory loss means that you have problems with some or all of your senses, such as vision, taste, hearing, smell, and touch.  Sensory loss happens because of damage to your brain and nervous system after a  stroke.  Treatment for sensory loss may include physical, occupational, or speech therapy, and the use of assistive devices.  You may need to make changes to your home and lifestyle after a stroke to help you live safely and independently. This information is not intended to replace advice given to you by your health care provider. Make sure you discuss any questions you have with your health care provider. Document Revised: 10/27/2018 Document Reviewed: 10/22/2016 Elsevier Patient Education  [place].

## 2019-09-19 NOTE — Telephone Encounter (Signed)
Pt would like to know why he has to go get blood transfusion? Pt is requesting a call today because he does not know why he has to keep on going to get blood transfusion. Thanks

## 2019-09-19 NOTE — Addendum Note (Signed)
Addended by: [name] R on: 09/19/2019 04:02 PM   Modules accepted: Orders

## 2019-09-20 ENCOUNTER — Emergency Department (HOSPITAL_COMMUNITY)
Admission: EM | Admit: 2019-09-20 | Discharge: 2019-09-20 | Disposition: A | Discharge status: Home / Self Care | Payer: Medicare Other | Attending: Emergency Medicine | Admitting: Emergency Medicine

## 2019-09-20 ENCOUNTER — Encounter (HOSPITAL_COMMUNITY): Payer: Self-pay | Admitting: Pharmacy Technician

## 2019-09-20 ENCOUNTER — Other Ambulatory Visit: Payer: Self-pay

## 2019-09-20 DIAGNOSIS — K922 Gastrointestinal hemorrhage, unspecified: Secondary | ICD-10-CM

## 2019-09-20 DIAGNOSIS — Z79899 Other long term (current) drug therapy: Secondary | ICD-10-CM | POA: Diagnosis not present

## 2019-09-20 DIAGNOSIS — F17210 Nicotine dependence, cigarettes, uncomplicated: Secondary | ICD-10-CM | POA: Insufficient documentation

## 2019-09-20 DIAGNOSIS — D649 Anemia, unspecified: Secondary | ICD-10-CM | POA: Diagnosis not present

## 2019-09-20 DIAGNOSIS — I10 Essential (primary) hypertension: Secondary | ICD-10-CM | POA: Diagnosis not present

## 2019-09-20 DIAGNOSIS — Z5320 Procedure and treatment not carried out because of patient's decision for unspecified reasons: Secondary | ICD-10-CM | POA: Insufficient documentation

## 2019-09-20 LAB — CBC
HCT: 29.1 % — ABNORMAL LOW (ref 39.0–52.0)
Hemoglobin: 7.2 g/dL — ABNORMAL LOW (ref 13.0–17.0)
MCH: 16.7 pg — ABNORMAL LOW (ref 26.0–34.0)
MCHC: 24.7 g/dL — ABNORMAL LOW (ref 30.0–36.0)
MCV: 67.4 fL — ABNORMAL LOW (ref 80.0–100.0)
Platelets: 403 10*3/uL — ABNORMAL HIGH (ref 150–400)
RBC: 4.32 MIL/uL (ref 4.22–5.81)
RDW: 19.9 % — ABNORMAL HIGH (ref 11.5–15.5)
WBC: 4.3 10*3/uL (ref 4.0–10.5)
nRBC: 0 % (ref 0.0–0.2)

## 2019-09-20 LAB — COMPREHENSIVE METABOLIC PANEL
ALT: 15 U/L (ref 0–44)
AST: 16 U/L (ref 15–41)
Albumin: 4 g/dL (ref 3.5–5.0)
Alkaline Phosphatase: 95 U/L (ref 38–126)
Anion gap: 10 (ref 5–15)
BUN: 10 mg/dL (ref 8–23)
CO2: 21 mmol/L — ABNORMAL LOW (ref 22–32)
Calcium: 9 mg/dL (ref 8.9–10.3)
Chloride: 110 mmol/L (ref 98–111)
Creatinine, Ser: 0.95 mg/dL (ref 0.61–1.24)
GFR calc Af Amer: >60 mL/min (ref >60)
GFR calc non Af Amer: >60 mL/min (ref >60)
Glucose, Bld: 96 mg/dL (ref 70–99)
Potassium: 4.4 mmol/L (ref 3.5–5.1)
Sodium: 141 mmol/L (ref 135–145)
Total Bilirubin: 0.4 mg/dL (ref 0.3–1.2)
Total Protein: 7 g/dL (ref 6.5–8.1)

## 2019-09-20 LAB — POC OCCULT BLOOD, ED: Fecal Occult Bld: POSITIVE — AB

## 2019-09-20 LAB — PREPARE RBC (CROSSMATCH)

## 2019-09-20 MED ORDER — SODIUM CHLORIDE 0.9 % IV SOLN: 10.0000 mL/h | Freq: Once | INTRAVENOUS | Status: DC

## 2019-09-20 MED ORDER — PANTOPRAZOLE SODIUM 40 MG IV SOLR
40.0000 mg | Freq: Once | INTRAVENOUS | Status: AC
  Administered 2019-09-20: 40 mg via INTRAVENOUS
  Filled 2019-09-20: qty 40

## 2019-09-20 NOTE — ED Provider Notes (Signed)
[facility] EMERGENCY DEPARTMENT Provider Note   CSN: [id number] Arrival date & time: 09/20/19  1114     History Chief Complaint  Patient presents with  . Abnormal Lab    [name] is a 69 y.o. male.  HPI   69 year old male with history of GI bleed, GERD, hepatitis C, prior blood transfusion sent here per PCP recommendation due to low hemoglobin.  Patient report he was seen in the office for regular blood work checkup today and was found to be anemic with hemoglobin of 6.  Patient sent here for further evaluation and management of his condition.  He has no specific complaint.  He denies having chest pain or shortness of breath no lightheadedness or dizziness no abdominal pain and have not noticed any change in his stools.  No change in his appetite.  No abnormal bleeding that he is aware of.  He is not on any blood thinner medication.  Past Medical History:  Diagnosis Date  . Arthritis   . Cameron ulcer, chronic 01/11/2018  . Chronic back pain   . GERD (gastroesophageal reflux disease)    uses Baking Soda  . GIB (gastrointestinal bleeding) 2012  . Glaucoma    right eye  . Headache    occasionally  . Hepatitis C   . Hiatal hernia with gastroesophageal reflux disease and esophagitis 01/05/2018  . History of blood transfusion    no abnormal reaction noted  . History of gout    doesn't take any meds  . Hyperlipidemia    not on any meds  . Hypertension    takes Amlodipine and Lisinopril daily  . Insomnia    takes Ambien nightly  . Ischemic colitis ([place]) 2012  . Joint pain   . Nocturia   . Numbness    both legs occasionally  . PONV (postoperative nausea and vomiting)   . Prostate cancer ([place])   . Shortness of breath dyspnea    rarely but when notices he can be lying/sitting/exertion.Dr.Hochrein is aware per pt  . Stroke ([place]) 2016   . Urinary frequency   . Urinary urgency     Patient Active Problem List   Diagnosis Date Noted  . Acute on  chronic anemia   . Pain   . Agitation 07/26/2018  . Hepatitis C 07/26/2018  . Nontraumatic thalamic hemorrhage ([place]) 07/26/2018  . Osteoarthritis of left hip 04/06/2018  . Cameron ulcer, chronic 01/11/2018  . Symptomatic anemia 01/10/2018  . Syncope 01/10/2018  . Hiatal hernia with gastroesophageal reflux disease and esophagitis 01/05/2018  . Insomnia 01/05/2018  . Rotator cuff syndrome of left shoulder 11/21/2017  . Anemia, iron deficiency   . Gastrointestinal bleeding 10/24/2015  . Tobacco abuse   . Acute ischemic stroke ([place])   . Stroke ([place]) 05/31/2015  . HTN (hypertension) 04/10/2014  . Inguinal hernia without mention of obstruction or gangrene, unilateral or unspecified, (not specified as recurrent)-right 01/30/2014    Past Surgical History:  Procedure Laterality Date  . APPENDECTOMY    . BIOPSY  01/11/2018   Procedure: BIOPSY;  Surgeon: Gatha Mayer, MD;  Location: [facility] ENDOSCOPY;  Service: Endoscopy;;  . COLONOSCOPY    . COLONOSCOPY N/A 10/26/2015   Procedure: COLONOSCOPY;  Surgeon: Doran Stabler, MD;  Location: [facility] ENDOSCOPY;  Service: Endoscopy;  Laterality: N/A;  . ESOPHAGOGASTRODUODENOSCOPY    . ESOPHAGOGASTRODUODENOSCOPY N/A 10/26/2015   Procedure: ESOPHAGOGASTRODUODENOSCOPY (EGD);  Surgeon: Doran Stabler, MD;  Location: [facility] ENDOSCOPY;  Service: Endoscopy;  Laterality: N/A;  . ESOPHAGOGASTRODUODENOSCOPY (EGD) WITH PROPOFOL N/A 01/11/2018   Procedure: ESOPHAGOGASTRODUODENOSCOPY (EGD) WITH PROPOFOL;  Surgeon: Gatha Mayer, MD;  Location: [facility] ENDOSCOPY;  Service: Endoscopy;  Laterality: N/A;  . INGUINAL HERNIA REPAIR Right 11/04/2014   Procedure: RIGHT INGUINAL HERNIA REPAIR WITH MESH;  Surgeon: Jackolyn Confer, MD;  Location: [place];  Service: General;  Laterality: Right;  . MASS EXCISION N/A 11/04/2014   Procedure: REMOVAL OF RIGHT GROIN SOFT TISSUE MASS;  Surgeon: Jackolyn Confer, MD;  Location: [place];  Service: General;  Laterality: N/A;  . Multiple abdominal  surgeries     total of 13  . PROSTATECTOMY    . SMALL INTESTINE SURGERY         Family History  Problem Relation Age of Onset  . Alzheimer's disease Father   . Cancer Mother        Lymph node  . Heart failure Brother 45       Transplant 13 years ago  . CAD Brother   . Heart disease Sister 28       MI    Social History   Tobacco Use  . Smoking status: Current Every Day Smoker    Packs/day: 0.50    Years: 50.00    Pack years: 25.00    Types: Cigarettes  . Smokeless tobacco: Never Used  Substance Use Topics  . Alcohol use: Yes    Alcohol/week: 0.0 standard drinks    Comment: occasionally  . Drug use: No    Frequency: 5.0 times per week    Home Medications Prior to Admission medications   Medication Sig Start Date End Date Taking? Authorizing Provider  acetaminophen (TYLENOL) 325 MG tablet Take 2 tablets (650 mg total) by mouth 4 (four) times daily -  with meals and at bedtime. 08/18/18   Love, Ivan Anchors, PA-C  ALPRAZolam (XANAX) 0.25 MG tablet Take 1 tablet (0.25 mg total) by mouth daily as needed for anxiety. 04/12/19   Laurey Morale, MD  amLODipine (NORVASC) 10 MG tablet Take 1 tablet (10 mg total) by mouth daily. 09/19/19   Billie Ruddy, MD  buPROPion (WELLBUTRIN) 100 MG tablet Take 1 tablet (100 mg total) by mouth 2 (two) times daily. 09/19/19   Billie Ruddy, MD  cyclobenzaprine (FLEXERIL) 5 MG tablet TAKE 1 TABLET BY MOUTH TWICE A DAY AS NEEDED FOR MUSCLE SPASMS 04/12/19   Meredith Staggers, MD  ferrous sulfate 325 (65 FE) MG tablet TAKE 1 TABLET BY MOUTH THREE TWICE A DAY WITH MEALS Patient taking differently: Take 325 mg by mouth 3 (three) times daily with meals.  03/30/18   Billie Ruddy, MD  hydrALAZINE (APRESOLINE) 10 MG tablet Take 1 tablet (10 mg total) by mouth at bedtime. 09/19/19   Billie Ruddy, MD  lidocaine (LIDODERM) 5 % Place 1 patch onto the skin daily. Apply to left hip at 7 am and remove at 7 pm daily. 08/18/18   Love, Ivan Anchors, PA-C  lisinopril  (ZESTRIL) 40 MG tablet Take 1 tablet (40 mg total) by mouth daily. 09/19/19   Billie Ruddy, MD  Menthol-Methyl Salicylate (MUSCLE RUB) 10-15 % CREA Apply 1 application topically 2 (two) times daily. To left shoulder and left hip 08/15/18   Love, Ivan Anchors, PA-C  pantoprazole (PROTONIX) 40 MG tablet TAKE 1 TABLET BY MOUTH TWICE A DAY BEFORE A MEAL 06/22/19   Billie Ruddy, MD  QUEtiapine (SEROQUEL) 100 MG tablet Take 1 tablet (100 mg total)  by mouth at bedtime. 08/08/19   Meredith Staggers, MD  senna-docusate (SENOKOT-S) 8.6-50 MG tablet Take 1 tablet by mouth 2 (two) times daily. 08/18/18   Love, Ivan Anchors, PA-C  silver sulfADIAZINE (SILVADENE) 1 % cream Apply 1 application topically 2 (two) times daily. 04/12/19   Laurey Morale, MD  sodium chloride (OCEAN) 0.65 % SOLN nasal spray Place 1 spray into both nostrils as needed for congestion. 08/18/18   Love, Ivan Anchors, PA-C  traMADol (ULTRAM) 50 MG tablet Take 1 tablet (50 mg total) by mouth every 12 (twelve) hours as needed for severe pain (shouder pain). 04/12/19   Laurey Morale, MD  zolpidem (AMBIEN) 5 MG tablet Take 1 tablet (5 mg total) by mouth at bedtime. 08/08/19   Meredith Staggers, MD    Allergies    Penicillins and Sudafed [pseudoephedrine]  Review of Systems   Review of Systems  All other systems reviewed and are negative.   Physical Exam Updated Vital Signs BP (!) 168/78 (BP Location: Right Arm)   Pulse 67   Temp 98.9 F (37.2 C) (Oral)   Resp 18   Ht 5\' 9"  (1.753 m)   Wt 95.3 kg   SpO2 100%   BMI 31.01 kg/m   Physical Exam Vitals and nursing note reviewed.  Constitutional:      General: He is not in acute distress.    Appearance: He is well-developed.  HENT:     Head: Atraumatic.  Eyes:     Conjunctiva/sclera: Conjunctivae normal.  Cardiovascular:     Rate and Rhythm: Normal rate and regular rhythm.     Pulses: Normal pulses.     Heart sounds: Normal heart sounds.  Pulmonary:     Effort: Pulmonary effort is  normal.     Breath sounds: Normal breath sounds.  Abdominal:     Palpations: Abdomen is soft.     Tenderness: There is no abdominal tenderness.  Genitourinary:    Comments: Chaperone present during exam.  Normal rectal tone, no obvious mass, greenish color stool without any obvious blood.  Fecal occult blood test positive. Musculoskeletal:     Cervical back: Neck supple.     Comments: Equal strength all 4 extremities.  Skin:    Findings: No rash.  Neurological:     Mental Status: He is alert and oriented to person, place, and time.  Psychiatric:        Mood and Affect: Mood normal.     ED Results / Procedures / Treatments   Labs (all labs ordered are listed, but only abnormal results are displayed) Labs Reviewed  COMPREHENSIVE METABOLIC PANEL - Abnormal; Notable for the following components:      Result Value   CO2 21 (*)    All other components within normal limits  CBC - Abnormal; Notable for the following components:   Hemoglobin 7.2 (*)    HCT 29.1 (*)    MCV 67.4 (*)    MCH 16.7 (*)    MCHC 24.7 (*)    RDW 19.9 (*)    Platelets 403 (*)    All other components within normal limits  POC OCCULT BLOOD, ED - Abnormal; Notable for the following components:   Fecal Occult Bld POSITIVE (*)    All other components within normal limits  SARS CORONAVIRUS 2 (TAT 6-24 HRS)  TYPE AND SCREEN  PREPARE RBC (CROSSMATCH)    EKG None  Radiology No results found.  Procedures .Critical Care Performed by: Domenic Moras,  PA-C Authorized by: Domenic Moras, PA-C   Critical care provider statement:    Critical care time (minutes):  31   Critical care was time spent personally by me on the following activities:  Discussions with consultants, evaluation of patient's response to treatment, examination of patient, ordering and performing treatments and interventions, ordering and review of laboratory studies, ordering and review of radiographic studies, pulse oximetry, re-evaluation of  patient's condition, obtaining history from patient or surrogate and review of old charts   (including critical care time)  Medications Ordered in ED Medications  0.9 %  sodium chloride infusion (has no administration in time range)  pantoprazole (PROTONIX) injection 40 mg (has no administration in time range)    ED Course  I have reviewed the triage vital signs and the nursing notes.  Pertinent labs & imaging results that were available during my care of the patient were reviewed by me and considered in my medical decision making (see chart for details).    MDM Rules/Calculators/A&P                      BP (!) 159/76   Pulse (!) 59   Temp 98.9 F (37.2 C) (Oral)   Resp (!) 24   Ht 5\' 9"  (1.753 m)   Wt 95.3 kg   SpO2 99%   BMI 31.01 kg/m   Final Clinical Impression(s) / ED Diagnoses Final diagnoses:  Symptomatic anemia  Gastrointestinal hemorrhage, unspecified gastrointestinal hemorrhage type    Rx / DC Orders ED Discharge Orders    None     Patient with history of recurrent acute on chronic anemia with fecal occult blood test positive sent here from PCP due to having a document hemoglobin of 6 from a regular blood work checkup yesterday.  Today, his hemoglobin is 7.2.  He does not have any active symptoms.  He does not have any melanotic stool however fecal occult blood test is positive.  Previously his source of blood loss anemia is attributed to Cameron's erosion within the hiatal hernia.  Patient have not been compliant with his medication.  He did take his blood pressure medication today.  We will order blood transfusion and will consult admission. GI specialist is Dr. Carlean Purl.  3:37 PM I discussed care with Dr. Alvino Chapel.  Plan to have patient transfused with 1 unit of packed red blood cells and admission.  However, patient refused to be admitted.  He is amenable for blood transfusion in the ED and would like to go home afterward.  Will transfuse blood and  encourage patient to follow-up closely with PCP for further care.  Patient signed out to oncoming team.   Domenic Moras, PA-C 09/20/19 1541    Davonna Belling, MD 09/21/19 [phone]

## 2019-09-20 NOTE — ED Provider Notes (Signed)
Medical Decision Making: Care of patient assumed from Domenic Moras PA-C at 1500.  Agree with history, physical exam and plan.  See their note for further details.  Briefly, 69 y.o. male with PMH/PSH as below.  Past Medical History:  Diagnosis Date  . Arthritis   . Cameron ulcer, chronic 01/11/2018  . Chronic back pain   . GERD (gastroesophageal reflux disease)    uses Baking Soda  . GIB (gastrointestinal bleeding) 2012  . Glaucoma    right eye  . Headache    occasionally  . Hepatitis C   . Hiatal hernia with gastroesophageal reflux disease and esophagitis 01/05/2018  . History of blood transfusion    no abnormal reaction noted  . History of gout    doesn't take any meds  . Hyperlipidemia    not on any meds  . Hypertension    takes Amlodipine and Lisinopril daily  . Insomnia    takes Ambien nightly  . Ischemic colitis ([place]) 2012  . Joint pain   . Nocturia   . Numbness    both legs occasionally  . PONV (postoperative nausea and vomiting)   . Prostate cancer ([place])   . Shortness of breath dyspnea    rarely but when notices he can be lying/sitting/exertion.Dr.Hochrein is aware per pt  . Stroke ([place]) 2016   . Urinary frequency   . Urinary urgency    Past Surgical History:  Procedure Laterality Date  . APPENDECTOMY    . BIOPSY  01/11/2018   Procedure: BIOPSY;  Surgeon: Gatha Mayer, MD;  Location: [facility] ENDOSCOPY;  Service: Endoscopy;;  . COLONOSCOPY    . COLONOSCOPY N/A 10/26/2015   Procedure: COLONOSCOPY;  Surgeon: Doran Stabler, MD;  Location: [facility] ENDOSCOPY;  Service: Endoscopy;  Laterality: N/A;  . ESOPHAGOGASTRODUODENOSCOPY    . ESOPHAGOGASTRODUODENOSCOPY N/A 10/26/2015   Procedure: ESOPHAGOGASTRODUODENOSCOPY (EGD);  Surgeon: Doran Stabler, MD;  Location: [facility] ENDOSCOPY;  Service: Endoscopy;  Laterality: N/A;  . ESOPHAGOGASTRODUODENOSCOPY (EGD) WITH PROPOFOL N/A 01/11/2018   Procedure: ESOPHAGOGASTRODUODENOSCOPY (EGD) WITH PROPOFOL;  Surgeon: Gatha Mayer, MD;   Location: [facility] ENDOSCOPY;  Service: Endoscopy;  Laterality: N/A;  . INGUINAL HERNIA REPAIR Right 11/04/2014   Procedure: RIGHT INGUINAL HERNIA REPAIR WITH MESH;  Surgeon: Jackolyn Confer, MD;  Location: [place];  Service: General;  Laterality: Right;  . MASS EXCISION N/A 11/04/2014   Procedure: REMOVAL OF RIGHT GROIN SOFT TISSUE MASS;  Surgeon: Jackolyn Confer, MD;  Location: [place];  Service: General;  Laterality: N/A;  . Multiple abdominal surgeries     total of 13  . PROSTATECTOMY    . SMALL INTESTINE SURGERY       Patient HDS at handoff.    Plan at time of handoff:   Chronic GI bleed, likely PUD.    Outpatient hgb 6 yesterday, 7.2 here today, getting transfused 1 unit PRBC, refusing admission, discharge after transfusion.    ED Course Rechecked after transfusion, resting comfortably in no distress.  No complaints at this time.  Hemodynamically stable.  Strict return precautions provided.  Will follow up with his PCP.  Discharged home in stable condition.    Clinical Impression 1. Symptomatic anemia   2. Gastrointestinal hemorrhage, unspecified gastrointestinal hemorrhage type        [name] Dunsmore, [place], MD 09/20/19 [id number]    Davonna Belling, MD 09/21/19 [phone]

## 2019-09-20 NOTE — ED Triage Notes (Signed)
Pt arrives pov after PCP called and told him to come due to a hgb of 6. Pt denies Shob, cp. Denies dark/red stools.

## 2019-09-20 NOTE — Discharge Instructions (Addendum)
You have been evaluated for low blood count, likely from stomach/intestinal bleeding.  You have been transfused one unit of blood. Please follow up closely with your doctor for further care.  You may need iron supplementation.

## 2019-09-20 NOTE — Telephone Encounter (Signed)
Pt has been seen in the ED today.

## 2019-09-20 NOTE — Telephone Encounter (Signed)
Called pt no answer. Looks like pt went for blood transfusion today.

## 2019-09-20 NOTE — ED Notes (Signed)
[place] sandwich given to pt.  No complaints voiced.  RBC's infusing at this time

## 2019-09-21 LAB — TYPE AND SCREEN
ABO/RH(D): O POS
Antibody Screen: NEGATIVE
Unit division: 0

## 2019-09-21 LAB — BPAM RBC
Blood Product Expiration Date: 202104022359
ISSUE DATE / TIME: 202103041556
Unit Type and Rh: 5100

## 2019-09-24 DIAGNOSIS — I639 Cerebral infarction, unspecified: Secondary | ICD-10-CM | POA: Diagnosis not present

## 2019-09-28 ENCOUNTER — Telehealth: Payer: Self-pay | Admitting: *Deleted

## 2019-09-28 ENCOUNTER — Telehealth: Payer: Self-pay | Admitting: Family Medicine

## 2019-09-28 NOTE — Telephone Encounter (Signed)
Spoke with pt state that he needs the rehabilitation phone number , pt requested to text it to him but explained that there was no way of sending pt the number since he does not have  MyChart portal

## 2019-09-28 NOTE — Telephone Encounter (Signed)
Patient called after hours line. Patient reports he was given a list of rehabs and he lost the paper. He needs a new list. Patient can be reached at number listed in chart or [phone] is an alternate number. Caller asks to be called back immediately.

## 2019-09-28 NOTE — Telephone Encounter (Signed)
Pt stated he lost the number and name of the Rehab facility [facility] recommened. He would like to get that information again.   Pt can be reached at [phone]

## 2019-10-03 NOTE — Telephone Encounter (Signed)
Called pt left detailed message regarding his Rehab appointment, advised pt to call the office with any question

## 2019-10-03 NOTE — Telephone Encounter (Signed)
This message has been completed

## 2019-10-05 ENCOUNTER — Telehealth: Payer: Self-pay

## 2019-10-05 NOTE — Telephone Encounter (Signed)
ptn left voicemail needs return call --- did not say why- I returned call to determine why and left voicemail for him to call back

## 2019-10-10 ENCOUNTER — Encounter: Payer: Self-pay | Admitting: Physical Medicine & Rehabilitation

## 2019-10-10 ENCOUNTER — Encounter: Payer: Medicare Other | Attending: Physical Medicine & Rehabilitation | Admitting: Physical Medicine & Rehabilitation

## 2019-10-10 ENCOUNTER — Other Ambulatory Visit: Payer: Self-pay

## 2019-10-10 VITALS — BP 186/79 | HR 68 | Temp 97.7°F

## 2019-10-10 DIAGNOSIS — I619 Nontraumatic intracerebral hemorrhage, unspecified: Secondary | ICD-10-CM | POA: Diagnosis not present

## 2019-10-10 DIAGNOSIS — I10 Essential (primary) hypertension: Secondary | ICD-10-CM | POA: Insufficient documentation

## 2019-10-10 DIAGNOSIS — F329 Major depressive disorder, single episode, unspecified: Secondary | ICD-10-CM | POA: Insufficient documentation

## 2019-10-10 DIAGNOSIS — F5101 Primary insomnia: Secondary | ICD-10-CM | POA: Diagnosis not present

## 2019-10-10 DIAGNOSIS — Z720 Tobacco use: Secondary | ICD-10-CM

## 2019-10-10 DIAGNOSIS — F419 Anxiety disorder, unspecified: Secondary | ICD-10-CM | POA: Insufficient documentation

## 2019-10-10 MED ORDER — BUPROPION HCL ER (SR) 200 MG PO TB12: 200.0000 mg | ORAL_TABLET | Freq: Every day | ORAL | 2 refills | Status: DC

## 2019-10-10 MED ORDER — HYDRALAZINE HCL 10 MG PO TABS: 10.0000 mg | ORAL_TABLET | Freq: Every day | ORAL | 3 refills | Status: DC

## 2019-10-10 MED ORDER — LISINOPRIL 40 MG PO TABS: 40.0000 mg | ORAL_TABLET | Freq: Every day | ORAL | 3 refills | Status: DC

## 2019-10-10 MED ORDER — AMLODIPINE BESYLATE 10 MG PO TABS: 10.0000 mg | ORAL_TABLET | Freq: Every day | ORAL | 3 refills | Status: DC

## 2019-10-10 MED ORDER — QUETIAPINE FUMARATE 200 MG PO TABS: 200.0000 mg | ORAL_TABLET | Freq: Every day | ORAL | 2 refills | Status: DC

## 2019-10-10 NOTE — Progress Notes (Signed)
Subjective:    Patient ID: [name], male    DOB: 1951/03/27, 69 y.o.   MRN: [id number]  HPI   [name] is here in follow up of his CVA and associated deficits. He continues to complain of insomnia. He has had insomnia since I've known him. He says that it has been worse since his fiancee and he split up.  He has tried taking additional Seroquel which seems to help somewhat.  He does not feel that the Ambien is making a big difference.  He tells me he is tried reading books.  He has no music player or radio in his room help him relax.  He still is smoking a cigarette in the evening.  He takes Wellbutrin 100 mg twice daily.  He has been involved in some therapy to work on his gait.  He has right knee pain with standing as well as when he transfers.  He asked what we can do help with the knee pain.     Pain Inventory Average Pain 0 Pain Right Now 0 My pain is no pain  In the last 24 hours, has pain interfered with the following? General activity 0 Relation with others 0 Enjoyment of life 0 What TIME of day is your pain at its worst? no pain Sleep (in general) very poor, insomniatic  Pain is worse with: no pain Pain improves with: no pain Relief from Meds: no pain  Mobility walk with assistance use a walker use a wheelchair needs help with transfers  Function retired I need assistance with the following:  meal prep  Neuro/Psych weakness trouble walking depression anxiety loss of taste or smell  Prior Studies Any changes since last visit?  no  Physicians involved in your care Any changes since last visit?  no   Family History  Problem Relation Age of Onset  . Alzheimer's disease Father   . Cancer Mother        Lymph node  . Heart failure Brother 45       Transplant 13 years ago  . CAD Brother   . Heart disease Sister 43       MI   Social History   Socioeconomic History  . Marital status: Legally Separated    Spouse name: Not on file  .  Number of children: 2  . Years of education: Not on file  . Highest education level: Not on file  Occupational History  . Not on file  Tobacco Use  . Smoking status: Current Every Day Smoker    Packs/day: 0.50    Years: 50.00    Pack years: 25.00    Types: Cigarettes  . Smokeless tobacco: Never Used  Substance and Sexual Activity  . Alcohol use: Yes    Alcohol/week: 0.0 standard drinks    Comment: occasionally  . Drug use: No    Frequency: 5.0 times per week  . Sexual activity: Yes  Other Topics Concern  . Not on file  Social History Narrative   One living child .  One murdered.  Lives with girlfriend.     Social Determinants of Health   Financial Resource Strain:   . Difficulty of Paying Living Expenses:   Food Insecurity:   . Worried About Charity fundraiser in the Last Year:   . Arboriculturist in the Last Year:   Transportation Needs:   . Film/video editor (Medical):   Marland Kitchen Lack of Transportation (Non-Medical):   Physical  Activity:   . Days of Exercise per Week:   . Minutes of Exercise per Session:   Stress:   . Feeling of Stress :   Social Connections:   . Frequency of Communication with Friends and Family:   . Frequency of Social Gatherings with Friends and Family:   . Attends Religious Services:   . Active Member of Clubs or Organizations:   . Attends Archivist Meetings:   Marland Kitchen Marital Status:    Past Surgical History:  Procedure Laterality Date  . APPENDECTOMY    . BIOPSY  01/11/2018   Procedure: BIOPSY;  Surgeon: Gatha Mayer, MD;  Location: [facility] ENDOSCOPY;  Service: Endoscopy;;  . COLONOSCOPY    . COLONOSCOPY N/A 10/26/2015   Procedure: COLONOSCOPY;  Surgeon: Doran Stabler, MD;  Location: [facility] ENDOSCOPY;  Service: Endoscopy;  Laterality: N/A;  . ESOPHAGOGASTRODUODENOSCOPY    . ESOPHAGOGASTRODUODENOSCOPY N/A 10/26/2015   Procedure: ESOPHAGOGASTRODUODENOSCOPY (EGD);  Surgeon: Doran Stabler, MD;  Location: [facility] ENDOSCOPY;  Service: Endoscopy;   Laterality: N/A;  . ESOPHAGOGASTRODUODENOSCOPY (EGD) WITH PROPOFOL N/A 01/11/2018   Procedure: ESOPHAGOGASTRODUODENOSCOPY (EGD) WITH PROPOFOL;  Surgeon: Gatha Mayer, MD;  Location: [facility] ENDOS[facility];  Service: Endoscopy;  Laterality: N/A;  . INGUINAL HERNIA REPAIR Right 11/04/2014   Procedure: RIGHT INGUINAL HERNIA REPAIR WITH MESH;  Surgeon: Jackolyn Confer, MD;  Location: [place];  Service: General;  Laterality: Right;  . MASS EXCISION N/A 11/04/2014   Procedure: REMOVAL OF RIGHT GROIN SOFT TISSUE MASS;  Surgeon: Jackolyn Confer, MD;  Location: [place];  Service: General;  Laterality: N/A;  . Multiple abdominal surgeries     total of 13  . PROSTATECTOMY    . SMALL INTESTINE SURGERY     Past Medical History:  Diagnosis Date  . Arthritis   . Cameron ulcer, chronic 01/11/2018  . Chronic back pain   . GERD (gastroesophageal reflux disease)    uses Baking Soda  . GIB (gastrointestinal bleeding) 2012  . Glaucoma    right eye  . Headache    occasionally  . Hepatitis C   . Hiatal hernia with gastroesophageal reflux disease and esophagitis 01/05/2018  . History of blood transfusion    no abnormal reaction noted  . History of gout    doesn't take any meds  . Hyperlipidemia    not on any meds  . Hypertension    takes Amlodipine and Lisinopril daily  . Insomnia    takes Ambien nightly  . Ischemic colitis ([place]) 2012  . Joint pain   . Nocturia   . Numbness    both legs occasionally  . PONV (postoperative nausea and vomiting)   . Prostate cancer ([place])   . Shortness of breath dyspnea    rarely but when notices he can be lying/sitting/exertion.Dr.Hochrein is aware per pt  . Stroke ([place]) 2016   . Urinary frequency   . Urinary urgency    BP (!) 186/79   Pulse 68   Temp 97.7 F (36.5 C)   SpO2 96%   Opioid Risk Score:   Fall Risk Score:  `1  Depression screen PHQ 2/9  Depression screen [facility] 2/9 09/19/2019 09/19/2019 10/31/2018 10/18/2017 08/29/2017 07/11/2017 06/23/2017  Decreased Interest 1  1 0 0 1 0 0  Down, Depressed, Hopeless 2 2 0 0 1 0 0  PHQ - 2 Score 3 3 0 0 2 0 0  Altered sleeping 3 3 - - 3 - -  Tired, decreased energy 2 2 - -  2 - -  Change in appetite 3 3 - - 3 - -  Feeling bad or failure about yourself  1 1 - - 1 - -  Trouble concentrating 1 1 - - 3 - -  Moving slowly or fidgety/restless 1 1 - - 3 - -  Suicidal thoughts 0 0 - - 0 - -  PHQ-9 Score 14 14 - - 17 - -  Difficult doing work/chores Somewhat difficult Somewhat difficult - - - - -  Some recent data might be hidden   Review of Systems  Constitutional: Negative.   HENT: Negative.   Eyes: Negative.   Respiratory: Negative.   Cardiovascular: Negative.   Gastrointestinal: Negative.   Endocrine: Negative.   Genitourinary: Negative.   Musculoskeletal: Positive for gait problem.  Skin: Negative.   Allergic/Immunologic: Negative.   Neurological: Positive for weakness.  Psychiatric/Behavioral: Positive for sleep disturbance.  All other systems reviewed and are negative.      Objective:   Physical Exam  General: No acute distress.  Smells of tobacco HEENT: EOMI, oral membranes moist Cards: reg rate  Chest: normal effort Abdomen: Soft, NT, ND Skin: dry, intact Extremities: no edema Neuro: Pt is cognitively appropriate with normal insight, memory, and awareness. Cranial nerves 2-12 are intact. Sensory exam is normal. Reflexes are 2+ in all 4's. Fine motor coordination is intact. No tremors.  Motor functions 5 out of 5 in the upper extremities he is 4-5 left lower extremity and 3 out of 5 right lower extremity grossly..  Musculoskeletal: Crepitus of right knee.  Sclerotic changes at the knee joint.  Mild effusion.  Knee is tender with passive range of motion. Psych: Pt's affect is appropriate. Pt is cooperative        Assessment & Plan:  1.  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             -Continue outpatient therapies.  Needs to  work on knee strengthening exercises and appropriate weight shift. 2.   History of chronic back pain/headaches/pain Management: Tylenol as needed for now             -Hx of left shoulder pain. We injected shoulder                          -pt never went for left shoulder MRI             -continue prn flexeril  -Recommended Voltaren gel and ice to the right knee.  Consider further interventions depending on his course. 3. Mood: Wellbutrin  changed to XR form 4. HTN:  Continue amlodipine and lisinopril--as well as hydralazine.    Made sure all medications are 90-day refills. 5.  Acute on chronic blood loss anemia/HH with gastritis:              per outpt GI/primary          6. Chronic Insomnia :             -INCREASE seroquel to 200mg  qhs  -melatonin OTC  -no pm cigarettes  -change wellbutrin to sustained release in AM  -Discussed that his sleep is a holistic effort me he needs to work on issues noted above.  He seems to want a "quick fix" for this long-term problem. 7.  Tobacco dependence:              - continue Wellbutrin  100 mg twice daily  Fifteen minutes of face to face patient care time were spent during this visit. All questions were encouraged and answered.  Follow up with me in 2 months.

## 2019-10-10 NOTE — Patient Instructions (Addendum)
PLEASE FEEL FREE TO CALL OUR OFFICE WITH ANY PROBLEMS OR QUESTIONS [phone]    OTHER IDEAS TO HELP WITH YOUR SLEEP: 1. MELATONIN 3MG  (UP TO 9MG ) AT BEDTIME  2. NO CIGARETTES AFTER DINNER (NICOTINE IS A STIMULANT)  3. TAKE YOUR WELLBUTRIN IN THE MORNING. (IT HAS STIMULATING EFFECTS)      VOLTAREN GEL THREE X DAILY TO YOUR RIGHT KNEE REGULAR  ICE

## 2019-10-11 ENCOUNTER — Ambulatory Visit: Payer: Medicare Other | Attending: Family Medicine

## 2019-10-11 ENCOUNTER — Other Ambulatory Visit: Payer: Self-pay

## 2019-10-11 VITALS — BP 178/88

## 2019-10-11 DIAGNOSIS — M6281 Muscle weakness (generalized): Secondary | ICD-10-CM | POA: Diagnosis not present

## 2019-10-11 DIAGNOSIS — R2689 Other abnormalities of gait and mobility: Secondary | ICD-10-CM | POA: Diagnosis not present

## 2019-10-11 NOTE — Therapy (Signed)
[facility] [address] [place], [place], [address] Phone: [phone]   Fax:  [phone]  Physical Therapy Evaluation  Patient Details  Name: [name] MRN: [id number] Date of Birth: 10-Mar-1951 Referring Provider (PT): [name]   Encounter Date: 10/11/2019  PT End of Session - 10/11/19 0842    Visit Number  1    Number of Visits  17    Date for PT Re-Evaluation  0000000   90 day cert but 60 day poc   Authorization Type  medicare payer so 10th visit note needed    PT Start Time  0800    PT Stop Time  0844    PT Time Calculation (min)  44 min    Equipment Utilized During Treatment  Gait belt    Activity Tolerance  Patient tolerated treatment well    Behavior During Therapy  [facility] for tasks assessed/performed       Past Medical History:  Diagnosis Date  . Arthritis   . Cameron ulcer, chronic 01/11/2018  . Chronic back pain   . GERD (gastroesophageal reflux disease)    uses Baking Soda  . GIB (gastrointestinal bleeding) 2012  . Glaucoma    right eye  . Headache    occasionally  . Hepatitis C   . Hiatal hernia with gastroesophageal reflux disease and esophagitis 01/05/2018  . History of blood transfusion    no abnormal reaction noted  . History of gout    doesn't take any meds  . Hyperlipidemia    not on any meds  . Hypertension    takes Amlodipine and Lisinopril daily  . Insomnia    takes Ambien nightly  . Ischemic colitis ([place]) 2012  . Joint pain   . Nocturia   . Numbness    both legs occasionally  . PONV (postoperative nausea and vomiting)   . Prostate cancer ([place])   . Shortness of breath dyspnea    rarely but when notices he can be lying/sitting/exertion.Dr.Hochrein is aware per pt  . Stroke ([place]) 2016   . Urinary frequency   . Urinary urgency     Past Surgical History:  Procedure Laterality Date  . APPENDECTOMY    . BIOPSY  01/11/2018   Procedure: BIOPSY;  Surgeon: Gatha Mayer, MD;  [facility];  Service: Endoscopy;;  . COLONOSCOPY    . COLONOSCOPY N/A 10/26/2015   Procedure: COLONOSCOPY;  Surgeon: Doran Stabler, MD;  Location: [facility] ENDOSCOPY;  Service: Endoscopy;  Laterality: N/A;  . ESOPHAGOGASTRODUODENOSCOPY    . ESOPHAGOGASTRODUODENOSCOPY N/A 10/26/2015   Procedure: ESOPHAGOGASTRODUODENOSCOPY (EGD);  Surgeon: Doran Stabler, MD;  Location: [facility] ENDOSCOPY;  Service: Endoscopy;  Laterality: N/A;  . ESOPHAGOGASTRODUODENOSCOPY (EGD) WITH PROPOFOL N/A 01/11/2018   Procedure: ESOPHAGOGASTRODUODENOSCOPY (EGD) WITH PROPOFOL;  Surgeon: Gatha Mayer, MD;  [facility] ENDOS[facility];  Service: Endoscopy;  Laterality: N/A;  . INGUINAL HERNIA REPAIR Right 11/04/2014   Procedure: RIGHT INGUINAL HERNIA REPAIR WITH MESH;  Surgeon: Jackolyn Confer, MD;  Location: [place];  Service: General;  Laterality: Right;  . MASS EXCISION N/A 11/04/2014   Procedure: REMOVAL OF RIGHT GROIN SOFT TISSUE MASS;  Surgeon: Jackolyn Confer, MD;  Location: [place];  Service: General;  Laterality: N/A;  . Multiple abdominal surgeries     total of 13  . PROSTATECTOMY    . SMALL INTESTINE SURGERY      Vitals:   10/11/19 0807  BP: (!) 178/88  Subjective Assessment - 10/11/19 0807    Subjective  Pt was referred for history of CVA. Pt has had 2 prior stroke. One 2 years ago and one a year ago which was thalamic hemorrhage on 07/20/2018. Pt reports after first stroke he was doing well but the second stroke caused right hemiparesis. Had therapy about a year ago and then nothing and has been trying to get back in. Pt presents in [place]. Reports walking has gotten more difficult since stopped therapy but does use walker sometimes in house.    Pertinent History  PMH: Right hemiparesis secondary to thalamic hemorrhage 07/20/18, chronic back pain/headaches/left shoulder pain, OA, HTN, anemia, insomnia    Patient Stated Goals  Pt wants to walk better.    Currently in Pain?  Yes    Pain Score  9      Pain Location  Hip    Pain Orientation  Left    Pain Descriptors / Indicators  Aching    Pain Type  Chronic pain    Aggravating Factors   sitting on it too long    Pain Relieving Factors  shifting off hip         [facility] PT Assessment - 10/11/19 0813      Assessment   Medical Diagnosis  history of CVA    Referring Provider (PT)  [name]    Onset Date/Surgical Date  09/19/19   history of CVAs   Hand Dominance  Right    Prior Therapy  Pt has outpatient last year.      Precautions   Precautions  Fall      Balance Screen   Has the patient fallen in the past 6 months  Yes    How many times?  1   walking and lost balance   Has the patient had a decrease in activity level because of a fear of falling?   No    Is the patient reluctant to leave their home because of a fear of falling?   Yes      [place]  Private residence    Living Arrangements  Alone    Type of [place] Access  Level entry    [place] - 2 wheels;Grab bars - toilet;Grab bars - tub/shower;Toilet riser;Shower seat - built in   Theatre manager     Prior Function   Level of Independence  Independent with household mobility with device;Independent with basic ADLs    Vocation  Retired    Leisure  watch TV      Cognition   Overall Cognitive Status  Impaired/Different from baseline    Area of Impairment  Memory    Attention  Focused    Awareness  Appears intact      Sensation   Light Touch  Appears Intact      Coordination   Gross Motor Movements are Fluid and Coordinated  Yes   intact RAMs   Fine Motor Movements are Fluid and Coordinated  No   slowed right finger opposition     ROM / Strength   AROM / PROM / Strength  Strength;AROM      AROM   Overall AROM Comments  Pt has limited left shoulder flexion due to RTC issues for some time. Only able to passively get about 60 degrees.      Strength   Strength  Assessment Site  Shoulder;Elbow;Hand;Hip;Knee;Ankle    Right/Left Shoulder  Right;Left    Right Shoulder Flexion  4/5    Left Shoulder Flexion  2-/5    Right/Left Elbow  Right;Left    Right Elbow Flexion  4/5    Right Elbow Extension  4/5    Left Elbow Flexion  4/5    Left Elbow Extension  4/5    Right/Left hand  Right;Left    Right Hand Gross Grasp  Functional    Left Hand Gross Grasp  Functional    Right/Left Hip  Right;Left    Right Hip Flexion  4/5    Right Hip ABduction  2-/5    Left Hip Flexion  4+/5    Right/Left Knee  Right;Left    Right Knee Flexion  4/5    Right Knee Extension  4/5    Left Knee Flexion  4+/5    Left Knee Extension  4+/5    Right/Left Ankle  Right;Left    Right Ankle Dorsiflexion  4/5    Left Ankle Dorsiflexion  4+/5      Bed Mobility   Bed Mobility  Left Sidelying to Sit;Sit to Supine    Left Sidelying to Sit  Supervision/Verbal cueing    Sit to Supine  Minimal Assistance - Patient > 75%   at feet     Transfers   Transfers  Sit to Stand;Stand to Sit    Sit to Stand  4: Min guard    Five time sit to stand comments   43 sec from low mat    Stand to Sit  4: Min guard      Ambulation/Gait   Ambulation/Gait  Yes    Ambulation/Gait Assistance  4: Min guard    Ambulation Distance (Feet)  65 Feet    Assistive device  Rolling walker    Gait Pattern  Step-through pattern;Decreased hip/knee flexion - right;Decreased dorsiflexion - right;Trunk flexed    Ambulation Surface  Level;Indoor    Gait velocity  37.17 sec=0.26 m/s      High Level Balance   High Level Balance Comments  Pt able to stand without UE support with PT assisting to donn/doff jackete                Objective measurements completed on examination: See above findings.              PT Education - 10/11/19 1646    Education Details  Pt was educated on PT plan of care    Person(s) Educated  Patient    Methods  Explanation    Comprehension  Verbalized understanding        PT Short Term Goals - 10/11/19 1703      PT SHORT TERM GOAL #1   Title  Pt will be independent with initial HEP for strengthening and balance to continue gains on own.    Time  4    Period  Weeks    Status  New    Target Date  11/10/19      PT SHORT TERM GOAL #2   Title  Pt will increase gait speed from 0.59m/s to >0.28m/s for improved household mobility.    Baseline  0.60m/s on 10/11/19    Time  4    Period  Weeks    Status  New    Target Date  11/10/19      PT SHORT TERM GOAL #3   Title  Pt will decrease 5 x sit to stand from  43 sec to <30 for improved balance and functional strength.    Baseline  5 x sit to stand on 10/11/19=43 sec from mat    Time  4    Period  Weeks    Status  New    Target Date  11/10/19      PT SHORT TERM GOAL #4   Title  Berg balance will be performed for further assess balance and LTG written.    Time  4    Period  Weeks    Status  New    Target Date  11/10/19        PT Long Term Goals - 10/11/19 1710      PT LONG TERM GOAL #1   Title  Pt will be independent with progressive HEP for strength, balance and walking in order to continue gains at home.    Time  8    Period  Weeks    Status  New    Target Date  12/10/19      PT LONG TERM GOAL #2   Title  Pt will ambulate > 400' with LRAD mod I for improved household and short community distances.    Time  8    Period  Weeks    Status  New    Target Date  12/10/19      PT LONG TERM GOAL #3   Title  Pt will ambulate up/down curb and ramp supervision for improved community access.    Time  8    Period  Weeks    Status  New    Target Date  12/10/19      PT LONG TERM GOAL #4   Title  Pt will decreased 5 x sit to stand from 43 sec to < 20 sec for improved balance and functional strength.    Time  8    Period  Weeks    Status  New    Target Date  12/10/19             Plan - 10/11/19 1651    Clinical Impression Statement  Pt presents to PT with history of CVA with most recent  one 07/20/2018. Pt had resulting right hemiparesis but mild. Pt also has history of chronic back pain and left shoulder pain that is chronic with history of OA. Pt has decreased mobility and has been using powerchair for most distances other than short distances at times in home with walker. Pt ambulating at slow gait speed of 0.9m/s indicating decreased safety for household ambulation. He is fall risk based on 5 x sit to stand of 43 sec. Pt will benefit from skilled PT to address strength, balance and functional mobility deficits.    Personal Factors and Comorbidities  Comorbidity 3+    Comorbidities  Right hemiparesis secondary to thalamic hemorrhage 07/20/18, chronic back pain/headaches/left shoulder pain, OA, HTN, anemia, insomniaPt    Examination-Activity Limitations  Bed Mobility;Locomotion Level;Stand;Transfers;Stairs    Examination-Participation Restrictions  Community Activity;Meal Prep    Stability/Clinical Decision Making  Stable/Uncomplicated    Clinical Decision Making  Moderate    Rehab Potential  Good    PT Frequency  2x / week    PT Duration  8 weeks    PT Treatment/Interventions  ADLs/Self Care Home Management;Cryotherapy;Moist Heat;Electrical Stimulation;DME Instruction;Gait training;Stair training;Functional mobility training;Therapeutic activities;Therapeutic exercise;Orthotic Fit/Training;Patient/family education;Neuromuscular re-education;Balance training;Vestibular;Passive range of motion;Manual techniques    PT Next Visit Plan  Perform Berg and TUG next visit.  Gait training, begin  strengthening HEP.    Consulted and Agree with Plan of Care  Patient       Patient will benefit from skilled therapeutic intervention in order to improve the following deficits and impairments:  Abnormal gait, Decreased activity tolerance, Decreased balance, Decreased strength  Visit Diagnosis: Other abnormalities of gait and mobility  Muscle weakness (generalized)     Problem List Patient  Active Problem List   Diagnosis Date Noted  . Reactive depression 10/10/2019  . Acute on chronic anemia   . Pain   . Agitation 07/26/2018  . Hepatitis C 07/26/2018  . Nontraumatic thalamic hemorrhage ([place]) 07/26/2018  . Osteoarthritis of left hip 04/06/2018  . Cameron ulcer, chronic 01/11/2018  . Symptomatic anemia 01/10/2018  . Syncope 01/10/2018  . Hiatal hernia with gastroesophageal reflux disease and esophagitis 01/05/2018  . Insomnia 01/05/2018  . Rotator cuff syndrome of left shoulder 11/21/2017  . Anemia, iron deficiency   . Gastrointestinal bleeding 10/24/2015  . Tobacco abuse   . Acute ischemic stroke ([place])   . Stroke ([place]) 05/31/2015  . HTN (hypertension) 04/10/2014  . Inguinal hernia without mention of obstruction or gangrene, unilateral or unspecified, (not specified as recurrent)-right 01/30/2014    Electa Sniff, PT, DPT, NCS 10/11/2019, 5:16 PM  [facility] [address] [place], [place], [address] Phone: [phone]   Fax:  [phone]  Name: [name] MRN: [id number] Date of Birth: 13-Sep-1950

## 2019-10-16 ENCOUNTER — Telehealth: Payer: Self-pay | Admitting: Family Medicine

## 2019-10-16 NOTE — Telephone Encounter (Signed)
Pt states that he has been requesting home health care for 2 months now. He also said he brought papers up here for [facility] to fill out for the home health. He is called to check on the status of this.  He said he is tired of calling about it and is upset no one has gotten back with him.   Pt states if he does not receive a call this week he will transfer doctors b/c he feels like no one has helped him.   Pt can be reached at [phone]

## 2019-10-16 NOTE — Telephone Encounter (Signed)
Attempted to call pt not able to leave a message pt mailbox is full

## 2019-10-24 ENCOUNTER — Ambulatory Visit: Payer: Medicare HMO | Attending: Family Medicine | Admitting: Physical Therapy

## 2019-10-24 ENCOUNTER — Telehealth: Payer: Self-pay | Admitting: Physical Therapy

## 2019-10-24 DIAGNOSIS — R2689 Other abnormalities of gait and mobility: Secondary | ICD-10-CM | POA: Insufficient documentation

## 2019-10-24 DIAGNOSIS — M6281 Muscle weakness (generalized): Secondary | ICD-10-CM | POA: Insufficient documentation

## 2019-10-24 NOTE — Telephone Encounter (Signed)
Contacted pt by phone today regarding missed visit at 8:45.  Pt had not set up transportation for today's appointment.  Offered patient open time at 10:15 but pt has to set up transportation 3 days in advance.  Alerted pt to next treatment day and time.  Asked pt if he still had his paper schedule, pt unsure - may need to print new schedule for pt at next visit.  Rico Junker, PT, DPT 10/24/19    9:16 AM

## 2019-10-24 NOTE — Telephone Encounter (Signed)
Not successful with speaking with pt mailbox is full

## 2019-10-30 ENCOUNTER — Ambulatory Visit: Payer: Medicare HMO

## 2019-11-01 ENCOUNTER — Ambulatory Visit: Payer: Medicare HMO

## 2019-11-06 ENCOUNTER — Ambulatory Visit: Payer: Medicare HMO | Admitting: Physical Therapy

## 2019-11-08 ENCOUNTER — Ambulatory Visit: Payer: Medicare HMO

## 2019-11-08 ENCOUNTER — Telehealth: Payer: Self-pay

## 2019-11-08 NOTE — Telephone Encounter (Signed)
PT called and left message about missed visit. Let pt know on message that he we don't hear back from him today confirming he will be at visit next Tuesday then he will be removed from schedule. [name], PT, DPT, NCS

## 2019-11-13 ENCOUNTER — Ambulatory Visit: Payer: Medicare HMO | Admitting: Physical Therapy

## 2019-11-13 ENCOUNTER — Encounter: Payer: Self-pay | Admitting: Physical Therapy

## 2019-11-13 ENCOUNTER — Other Ambulatory Visit: Payer: Self-pay

## 2019-11-13 DIAGNOSIS — R6889 Other general symptoms and signs: Secondary | ICD-10-CM | POA: Diagnosis not present

## 2019-11-13 DIAGNOSIS — R2689 Other abnormalities of gait and mobility: Secondary | ICD-10-CM | POA: Diagnosis not present

## 2019-11-13 DIAGNOSIS — M6281 Muscle weakness (generalized): Secondary | ICD-10-CM

## 2019-11-13 NOTE — Therapy (Signed)
[facility] [address] [place], [place], [address] Phone: [phone]   Fax:  [phone]  Physical Therapy Treatment  Patient Details  Name: [name] MRN: [id number] Date of Birth: [birth year] Referring Provider (PT): [name]   Encounter Date: 11/13/2019  PT End of Session - 11/13/19 1025    Visit Number  2    Number of Visits  17    Date for PT Re-Evaluation  0000000   90 day cert but 60 day poc   Authorization Type  medicare payer so 10th visit note needed    PT Start Time  1017    PT Stop Time  1100    PT Time Calculation (min)  43 min    Equipment Utilized During Treatment  Gait belt    Activity Tolerance  Patient tolerated treatment well    Behavior During Therapy  [facility] for tasks assessed/performed       Past Medical History:  Diagnosis Date  . Arthritis   . Cameron ulcer, chronic 01/11/2018  . Chronic back pain   . GERD (gastroesophageal reflux disease)    uses Baking Soda  . GIB (gastrointestinal bleeding) 2012  . Glaucoma    right eye  . Headache    occasionally  . Hepatitis C   . Hiatal hernia with gastroesophageal reflux disease and esophagitis 01/05/2018  . History of blood transfusion    no abnormal reaction noted  . History of gout    doesn't take any meds  . Hyperlipidemia    not on any meds  . Hypertension    takes Amlodipine and Lisinopril daily  . Insomnia    takes Ambien nightly  . Ischemic colitis ([place]) 2012  . Joint pain   . Nocturia   . Numbness    both legs occasionally  . PONV (postoperative nausea and vomiting)   . Prostate cancer ([place])   . Shortness of breath dyspnea    rarely but when notices he can be lying/sitting/exertion.Dr.Hochrein is aware per pt  . Stroke ([place]) 2016   . Urinary frequency   . Urinary urgency     Past Surgical History:  Procedure Laterality Date  . APPENDECTOMY    . BIOPSY  01/11/2018   Procedure: BIOPSY;  Surgeon: Gatha Mayer, MD;  Location: [facility] ENDOSCOPY;  Service: Endoscopy;;  . COLONOSCOPY    . COLONOSCOPY N/A 10/26/2015   Procedure: COLONOSCOPY;  Surgeon: Doran Stabler, MD;  Location: [facility] ENDOSCOPY;  Service: Endoscopy;  Laterality: N/A;  . ESOPHAGOGASTRODUODENOSCOPY    . ESOPHAGOGASTRODUODENOSCOPY N/A 10/26/2015   Procedure: ESOPHAGOGASTRODUODENOSCOPY (EGD);  Surgeon: Doran Stabler, MD;  Location: [facility] ENDOSCOPY;  Service: Endoscopy;  Laterality: N/A;  . ESOPHAGOGASTRODUODENOSCOPY (EGD) WITH PROPOFOL N/A 01/11/2018   Procedure: ESOPHAGOGASTRODUODENOSCOPY (EGD) WITH PROPOFOL;  Surgeon: Gatha Mayer, MD;  Location: [facility] ENDOSCOPY;  Service: Endoscopy;  Laterality: N/A;  . INGUINAL HERNIA REPAIR Right 11/04/2014   Procedure: RIGHT INGUINAL HERNIA REPAIR WITH MESH;  Surgeon: Jackolyn Confer, MD;  Location: [place];  Service: General;  Laterality: Right;  . MASS EXCISION N/A 11/04/2014   Procedure: REMOVAL OF RIGHT GROIN SOFT TISSUE MASS;  Surgeon: Jackolyn Confer, MD;  Location: [place];  Service: General;  Laterality: N/A;  . Multiple abdominal surgeries     total of 13  . PROSTATECTOMY    . SMALL INTESTINE SURGERY      There were no vitals filed for this visit.  Subjective Assessment - 11/13/19  1021    Subjective  No falls. Having some right LE pain, has had this since most recent stroke. Does some walking with RW at home- around house as needed and to mailbox sometimes.    Pertinent History  PMH: Right hemiparesis secondary to thalamic hemorrhage 07/20/18, chronic back pain/headaches/left shoulder pain, OA, HTN, anemia, insomnia    Patient Stated Goals  Pt wants to walk better.    Currently in Pain?  Yes    Pain Score  10-Worst pain ever    Pain Location  Leg    Pain Orientation  Right    Pain Descriptors / Indicators  Aching;Nagging    Pain Type  Chronic pain;Neuropathic pain    Pain Onset  More than a month ago    Pain Frequency  Constant    Aggravating Factors   walking on it, sitting too long makes  his buttocks hurt too    Pain Relieving Factors  sitting down and shifting of the hip         [facility] PT Assessment - 11/13/19 1026      Standardized Balance Assessment   Standardized Balance Assessment  Berg Balance Test;Timed Up and Go Test      Berg Balance Test   Sit to Stand  Able to stand  independently using hands    Standing Unsupported  Able to stand 2 minutes with supervision    Sitting with Back Unsupported but Feet Supported on Floor or Stool  Able to sit safely and securely 2 minutes    Stand to Sit  Controls descent by using hands    Transfers  Able to transfer safely, definite need of hands    Standing Unsupported with Eyes Closed  Able to stand 10 seconds with supervision    Standing Unsupported with Feet Together  Needs help to attain position but able to stand for 30 seconds with feet together   goes from really wide base to hip width apart, s   From Standing, Reach Forward with Outstretched Arm  Can reach forward >12 cm safely (5")   8 inches   From Standing Position, Pick up Object from Floor  Able to pick up shoe, needs supervision    From Standing Position, Turn to Look Behind Over each Shoulder  Turn sideways only but maintains balance    Turn 360 Degrees  Needs close supervision or verbal cueing    Standing Unsupported, Alternately Place Feet on Step/Stool  Needs assistance to keep from falling or unable to try    Standing Unsupported, One Foot in Front  Able to take small step independently and hold 30 seconds    Standing on One Leg  Tries to lift leg/unable to hold 3 seconds but remains standing independently    Total Score  32    Berg comment:  32/56 today (37/56 on 01/31/2019). <36 indicates a fall risk      Timed Up and Go Test   Normal TUG (seconds)  33.4   with RW       [facility] Adult PT Treatment/Exercise - 11/13/19 1101      Transfers   Transfers  Sit to Stand;Stand to Sit;Squat Pivot Transfers    Sit to Stand  4: Min guard;With upper extremity  assist;From bed;From chair/3-in-1    Stand to Sit  4: Min guard;With upper extremity assist;To bed;To chair/3-in-1    Squat Pivot Transfers  6: Modified independent (Device/Increase time)    Squat Pivot Transfer Details (indicate cue type and  reason)  power chair<>mat table with UE assist      Exercises   Exercises  Other Exercises- issued ex's to HEP today. Refer to [place] for full details.      issued the following to pt's HEP today: Access Code: [email or web address] URL: [email or web address] Date: 11/13/2019 Prepared by: Willow Ora  Exercises Seated Hamstring Curl with Anchored Resistance - 1 x daily - 6 x weekly - 10 reps - 2 sets Seated Ankle Dorsiflexion with Resistance - 1 x daily - 6 x weekly - 10 reps - 2 sets Sit to Stand - 1 x daily - 6 x weekly - 5 reps - 2 sets Heel Toe Raises with Counter Support - 1 x daily - 5 x weekly - 10 reps - 1 sets Squat with Chair and Counter Support - 1 x daily - 5 x weekly - 10 reps - 1 sets      PT Education - 11/13/19 1116    Education Details  results of Berg Balance Test and Timed Up and Go; initial HEP for LE strengthening.    Person(s) Educated  Patient    Methods  Explanation;Demonstration;Verbal cues;Handout    Comprehension  Verbalized understanding;Returned demonstration;Verbal cues required;Need further instruction       PT Short Term Goals - 10/11/19 1703      PT SHORT TERM GOAL #1   Title  Pt will be independent with initial HEP for strengthening and balance to continue gains on own.    Time  4    Period  Weeks    Status  New    Target Date  11/10/19      PT SHORT TERM GOAL #2   Title  Pt will increase gait speed from 0.22m/s to >0.18m/s for improved household mobility.    Baseline  0.36m/s on 10/11/19    Time  4    Period  Weeks    Status  New    Target Date  11/10/19      PT SHORT TERM GOAL #3   Title  Pt will decrease 5 x sit to stand from 43 sec to <30 for improved balance and functional strength.     Baseline  5 x sit to stand on 10/11/19=43 sec from mat    Time  4    Period  Weeks    Status  New    Target Date  11/10/19      PT SHORT TERM GOAL #4   Title  Berg balance will be performed for further assess balance and LTG written.    Time  4    Period  Weeks    Status  New    Target Date  11/10/19        PT Long Term Goals - 10/11/19 1710      PT LONG TERM GOAL #1   Title  Pt will be independent with progressive HEP for strength, balance and walking in order to continue gains at home.    Time  8    Period  Weeks    Status  New    Target Date  12/10/19      PT LONG TERM GOAL #2   Title  Pt will ambulate > 400' with LRAD mod I for improved household and short community distances.    Time  8    Period  Weeks    Status  New    Target Date  12/10/19      PT LONG TERM GOAL #3  Title  Pt will ambulate up/down curb and ramp supervision for improved community access.    Time  8    Period  Weeks    Status  New    Target Date  12/10/19      PT LONG TERM GOAL #4   Title  Pt will decreased 5 x sit to stand from 43 sec to < 20 sec for improved balance and functional strength.    Time  8    Period  Weeks    Status  New    Target Date  12/10/19            Plan - 11/13/19 1025    Clinical Impression Statement  Today's skilled session focused on setting baseline value for Berg Balance Test with pt scoring 32/56, in the high fall risk category. Also set baseline value for Timed Up and Go with pt's time being 33.4 sec's with RW. Remainder of session focused on an HEP for strengthening. The pt should benefit from continued PT to progress toward unmet goals.    Personal Factors and Comorbidities  Comorbidity 3+    Comorbidities  Right hemiparesis secondary to thalamic hemorrhage 07/20/18, chronic back pain/headaches/left shoulder pain, OA, HTN, anemia, insomniaPt    Examination-Activity Limitations  Bed Mobility;Locomotion Level;Stand;Transfers;Stairs    Examination-Participation  Restrictions  Community Activity;Meal Prep    Stability/Clinical Decision Making  Stable/Uncomplicated    Rehab Potential  Good    PT Frequency  2x / week    PT Duration  8 weeks    PT Treatment/Interventions  ADLs/Self Care Home Management;Cryotherapy;Moist Heat;Electrical Stimulation;DME Instruction;Gait training;Stair training;Functional mobility training;Therapeutic activities;Therapeutic exercise;Orthotic Fit/Training;Patient/family education;Neuromuscular re-education;Balance training;Vestibular;Passive range of motion;Manual techniques    PT Next Visit Plan  Gait training with RW, add balance ex's to HEP. Primary PT to reset dates as pt has missed several weeks of therapy.    PT Home Exercise Plan  Access Code: [email or web address]    Consulted and Agree with Plan of Care  Patient       Patient will benefit from skilled therapeutic intervention in order to improve the following deficits and impairments:  Abnormal gait, Decreased activity tolerance, Decreased balance, Decreased strength  Visit Diagnosis: Other abnormalities of gait and mobility  Muscle weakness (generalized)     Problem List Patient Active Problem List   Diagnosis Date Noted  . Reactive depression 10/10/2019  . Acute on chronic anemia   . Pain   . Agitation 07/26/2018  . Hepatitis C 07/26/2018  . Nontraumatic thalamic hemorrhage ([place]) 07/26/2018  . Osteoarthritis of left hip 04/06/2018  . Cameron ulcer, chronic 01/11/2018  . Symptomatic anemia 01/10/2018  . Syncope 01/10/2018  . Hiatal hernia with gastroesophageal reflux disease and esophagitis 01/05/2018  . Insomnia 01/05/2018  . Rotator cuff syndrome of left shoulder 11/21/2017  . Anemia, iron deficiency   . Gastrointestinal bleeding 10/24/2015  . Tobacco abuse   . Acute ischemic stroke ([place])   . Stroke ([place]) 05/31/2015  . HTN (hypertension) 04/10/2014  . Inguinal hernia without mention of obstruction or gangrene, unilateral or unspecified, (not specified as  recurrent)-right 01/30/2014    Willow Ora, PTA, [facility] [address], [place], [place] [address] [phone] 11/13/19, 11:32 AM   Name: [name] MRN: [id number] Date of Birth: January 26, 1951

## 2019-11-13 NOTE — Patient Instructions (Signed)
Access Code: [email or web address] URL: [email or web address] Date: 11/13/2019 Prepared by: Willow Ora  Exercises Seated Hamstring Curl with Anchored Resistance - 1 x daily - 6 x weekly - 10 reps - 2 sets Seated Ankle Dorsiflexion with Resistance - 1 x daily - 6 x weekly - 10 reps - 2 sets Sit to Stand - 1 x daily - 6 x weekly - 5 reps - 2 sets Heel Toe Raises with Counter Support - 1 x daily - 5 x weekly - 10 reps - 1 sets Squat with Chair and Counter Support - 1 x daily - 5 x weekly - 10 reps - 1 sets

## 2019-11-15 ENCOUNTER — Other Ambulatory Visit: Payer: Self-pay

## 2019-11-15 ENCOUNTER — Ambulatory Visit: Payer: Medicare HMO

## 2019-11-15 DIAGNOSIS — R2689 Other abnormalities of gait and mobility: Secondary | ICD-10-CM

## 2019-11-15 DIAGNOSIS — M6281 Muscle weakness (generalized): Secondary | ICD-10-CM

## 2019-11-15 NOTE — Therapy (Signed)
[facility] [address] [place], [place], [address] Phone: [phone]   Fax:  [phone]  Physical Therapy Treatment  Patient Details  Name: [name] MRN: [id number] Date of Birth: 1951/03/10 Referring Provider (PT): [name]   Encounter Date: 11/15/2019  PT End of Session - 11/15/19 0933    Visit Number  3    Number of Visits  17    Date for PT Re-Evaluation  0000000   90 day cert but 60 day poc   Authorization Type  medicare payer so 10th visit note needed    PT Start Time  0930    PT Stop Time  1010    PT Time Calculation (min)  40 min    Equipment Utilized During Treatment  Gait belt    Activity Tolerance  Patient tolerated treatment well    Behavior During Therapy  [facility] for tasks assessed/performed       Past Medical History:  Diagnosis Date  . Arthritis   . Cameron ulcer, chronic 01/11/2018  . Chronic back pain   . GERD (gastroesophageal reflux disease)    uses Baking Soda  . GIB (gastrointestinal bleeding) 2012  . Glaucoma    right eye  . Headache    occasionally  . Hepatitis C   . Hiatal hernia with gastroesophageal reflux disease and esophagitis 01/05/2018  . History of blood transfusion    no abnormal reaction noted  . History of gout    doesn't take any meds  . Hyperlipidemia    not on any meds  . Hypertension    takes Amlodipine and Lisinopril daily  . Insomnia    takes Ambien nightly  . Ischemic colitis ([place]) 2012  . Joint pain   . Nocturia   . Numbness    both legs occasionally  . PONV (postoperative nausea and vomiting)   . Prostate cancer ([place])   . Shortness of breath dyspnea    rarely but when notices he can be lying/sitting/exertion.Dr.Hochrein is aware per pt  . Stroke ([place]) 2016   . Urinary frequency   . Urinary urgency     Past Surgical History:  Procedure Laterality Date  . APPENDECTOMY    . BIOPSY  01/11/2018   Procedure: BIOPSY;  Surgeon: Gatha Mayer, MD;  Location: [facility] ENDOSCOPY;  Service: Endoscopy;;  . COLONOSCOPY    . COLONOSCOPY N/A 10/26/2015   Procedure: COLONOSCOPY;  Surgeon: Doran Stabler, MD;  Location: [facility] ENDOSCOPY;  Service: Endoscopy;  Laterality: N/A;  . ESOPHAGOGASTRODUODENOSCOPY    . ESOPHAGOGASTRODUODENOSCOPY N/A 10/26/2015   Procedure: ESOPHAGOGASTRODUODENOSCOPY (EGD);  Surgeon: Doran Stabler, MD;  Location: [facility] ENDOSCOPY;  Service: Endoscopy;  Laterality: N/A;  . ESOPHAGOGASTRODUODENOSCOPY (EGD) WITH PROPOFOL N/A 01/11/2018   Procedure: ESOPHAGOGASTRODUODENOSCOPY (EGD) WITH PROPOFOL;  Surgeon: Gatha Mayer, MD;  Location: [facility] ENDOSCOPY;  Service: Endoscopy;  Laterality: N/A;  . INGUINAL HERNIA REPAIR Right 11/04/2014   Procedure: RIGHT INGUINAL HERNIA REPAIR WITH MESH;  Surgeon: Jackolyn Confer, MD;  Location: [facility];  Service: General;  Laterality: Right;  . MASS EXCISION N/A 11/04/2014   Procedure: REMOVAL OF RIGHT GROIN SOFT TISSUE MASS;  Surgeon: Jackolyn Confer, MD;  Location: [place] [facility];  Service: General;  Laterality: N/A;  . Multiple abdominal surgeries     total of 13  . PROSTATECTOMY    . SMALL INTESTINE SURGERY      There were no vitals filed for this visit.  Subjective Assessment - 11/15/19  0931    Subjective  Pt reports that he was sore in legs after last session as had not done exercises in awhile. Pt reports not sleeping at all for some time. Voicing frustration with doctor about this and need for assistance in home.    Pertinent History  PMH: Right hemiparesis secondary to thalamic hemorrhage 07/20/18, chronic back pain/headaches/left shoulder pain, OA, HTN, anemia, insomnia    Patient Stated Goals  Pt wants to walk better.    Currently in Pain?  Yes    Pain Score  9     Pain Location  Leg    Pain Orientation  Right    Pain Descriptors / Indicators  Aching    Pain Type  Chronic pain    Pain Onset  More than a month ago                       [facility] Adult PT Treatment/Exercise -  11/15/19 0933      Bed Mobility   Bed Mobility  Rolling Right;Rolling Left;Supine to Sit;Sit to Supine    Rolling Right  Supervision/verbal cueing    Rolling Left  Supervision/Verbal cueing    Supine to Sit  Supervision/Verbal cueing    Sit to Supine  Supervision/Verbal cueing      Transfers   Transfers  Sit to Stand;Stand to Sit;Stand Pivot Transfers    Sit to Stand  5: Supervision    Stand to Sit  5: Supervision    Squat Pivot Transfers  5: Supervision    Squat Pivot Transfer Details (indicate cue type and reason)  powerchair to/from mat with RW      Ambulation/Gait   Ambulation/Gait  Yes    Ambulation/Gait Assistance  4: Min guard    Ambulation Distance (Feet)  230 Feet    Assistive device  Rolling walker    Gait Pattern  Step-through pattern;Decreased step length - right;Decreased step length - left;Trunk flexed;Narrow base of support    Ambulation Surface  Level;Indoor    Gait Comments  BP=152/88 after gait. Pt reported being tired after gait and legs painful. Pt was given verbal cues to stay up in walker to try to get more erect posture.      Exercises   Exercises  Other Exercises    Other Exercises   Pt performed seated hamstring curl with manual resistance x 10 each leg then resisted ankle DF x 10 each leg. Pt was cued to move through whole motion. Sidelying clamshell 10 x 2 each leg with verbal and tactile cues for form. Was going to attempt bridge but patient reported too much discomfort with laying on back. Standing at walker with PT stabilizing walker stepping out to side and back x 10 each leg with verbal cues for more erect posture and form. Standing at walker stepping backwards x 10 each leg with PT stabilizing walker. Pt was given cues for form. Needed seated rest break between activites.             PT Education - 11/15/19 1212    Education Details  Added clamshell to HEP    Person(s) Educated  Patient    Methods  Explanation;Demonstration;Handout     Comprehension  Verbalized understanding;Returned demonstration       PT Short Term Goals - 11/15/19 1213      PT SHORT TERM GOAL #1   Title  Pt will be independent with initial HEP for strengthening and balance to continue gains on  own. (target dates updated due to missed visits  to 12/06/19)    Time  4    Period  Weeks    Status  New    Target Date  12/06/19      PT SHORT TERM GOAL #2   Title  Pt will increase gait speed from 0.78m/s to >0.58m/s for improved household mobility.    Baseline  0.12m/s on 10/11/19    Time  4    Period  Weeks    Status  New    Target Date  12/06/19      PT SHORT TERM GOAL #3   Title  Pt will decrease 5 x sit to stand from 43 sec to <30 for improved balance and functional strength.    Baseline  5 x sit to stand on 10/11/19=43 sec from mat    Time  4    Period  Weeks    Status  New    Target Date  12/13/19      PT SHORT TERM GOAL #4   Title  Berg balance will be performed for further assess balance and LTG written.    Baseline  Merrilee Jansky was performed on 11/13/19 with score of 32/56    Time  4    Period  Weeks    Status  Achieved    Target Date  12/13/19        PT Long Term Goals - 11/15/19 1215      PT LONG TERM GOAL #1   Title  Pt will be independent with progressive HEP for strength, balance and walking in order to continue gains at home.    Time  8    Period  Weeks    Status  New    Target Date  01/03/20   target dates updated due to missed visits     PT LONG TERM GOAL #2   Title  Pt will ambulate > 400' with LRAD mod I for improved household and short community distances.    Time  8    Period  Weeks    Status  New    Target Date  01/03/20      PT LONG TERM GOAL #3   Title  Pt will ambulate up/down curb and ramp supervision for improved community access.    Time  8    Period  Weeks    Status  New    Target Date  01/03/20      PT LONG TERM GOAL #4   Title  Pt will decreased 5 x sit to stand from 43 sec to < 20 sec for improved  balance and functional strength.    Time  8    Period  Weeks    Status  New    Target Date  01/03/20      PT LONG TERM GOAL #5   Title  Pt will increase Berg Balance from 32/56 to >38/56 for improved balance and decreased fall risk.    Baseline  32/56    Time  8    Period  Weeks    Status  New    Target Date  01/03/20            Plan - 11/15/19 1217    Clinical Impression Statement  PT updated target dates for both short term and long term goals due to multiple missed visits since eval. Added LTG for Berg based on scoring at last session indicating fall risk. Pt was able to  increase gait distance today.    Personal Factors and Comorbidities  Comorbidity 3+    Comorbidities  Right hemiparesis secondary to thalamic hemorrhage 07/20/18, chronic back pain/headaches/left shoulder pain, OA, HTN, anemia, insomniaPt    Examination-Activity Limitations  Bed Mobility;Locomotion Level;Stand;Transfers;Stairs    Examination-Participation Restrictions  Community Activity;Meal Prep    Stability/Clinical Decision Making  Stable/Uncomplicated    Rehab Potential  Good    PT Frequency  2x / week    PT Duration  8 weeks    PT Treatment/Interventions  ADLs/Self Care Home Management;Cryotherapy;Moist Heat;Electrical Stimulation;DME Instruction;Gait training;Stair training;Functional mobility training;Therapeutic activities;Therapeutic exercise;Orthotic Fit/Training;Patient/family education;Neuromuscular re-education;Balance training;Vestibular;Passive range of motion;Manual techniques    PT Next Visit Plan  Progress standing exercises and determine if safe to add to HEP. Gait training with RW, add balance ex's to HEP.    PT Home Exercise Plan  Access Code: [email or web address]    Consulted and Agree with Plan of Care  Patient       Patient will benefit from skilled therapeutic intervention in order to improve the following deficits and impairments:  Abnormal gait, Decreased activity tolerance, Decreased balance,  Decreased strength  Visit Diagnosis: Other abnormalities of gait and mobility  Muscle weakness (generalized)     Problem List Patient Active Problem List   Diagnosis Date Noted  . Reactive depression 10/10/2019  . Acute on chronic anemia   . Pain   . Agitation 07/26/2018  . Hepatitis C 07/26/2018  . Nontraumatic thalamic hemorrhage ([place]) 07/26/2018  . Osteoarthritis of left hip 04/06/2018  . Cameron ulcer, chronic 01/11/2018  . Symptomatic anemia 01/10/2018  . Syncope 01/10/2018  . Hiatal hernia with gastroesophageal reflux disease and esophagitis 01/05/2018  . Insomnia 01/05/2018  . Rotator cuff syndrome of left shoulder 11/21/2017  . Anemia, iron deficiency   . Gastrointestinal bleeding 10/24/2015  . Tobacco abuse   . Acute ischemic stroke ([place])   . Stroke ([place]) 05/31/2015  . HTN (hypertension) 04/10/2014  . Inguinal hernia without mention of obstruction or gangrene, unilateral or unspecified, (not specified as recurrent)-right 01/30/2014    Electa Sniff, PT, DPT, NCS 11/15/2019, 12:21 PM  [facility] [address] [place], [place], [address] Phone: [phone]   Fax:  [phone]  Name: Oberyn Bouyea MRN: [id number] Date of Birth: 1950/11/27

## 2019-11-15 NOTE — Patient Instructions (Addendum)
Access Code: [email or web address] URL: [email or web address] Date: 11/15/2019 Prepared by: [name]  Exercises Seated Hamstring Curl with Anchored Resistance - 1 x daily - 6 x weekly - 10 reps - 2 sets Seated Ankle Dorsiflexion with Resistance - 1 x daily - 6 x weekly - 10 reps - 2 sets Sit to Stand - 1 x daily - 6 x weekly - 5 reps - 2 sets Heel Toe Raises with Counter Support - 1 x daily - 5 x weekly - 10 reps - 1 sets Squat with Chair and Counter Support - 1 x daily - 5 x weekly - 10 reps - 1 sets Clamshell - 1 x daily - 7 x weekly - 2 sets - 10 reps

## 2019-11-20 ENCOUNTER — Other Ambulatory Visit: Payer: Self-pay

## 2019-11-20 ENCOUNTER — Ambulatory Visit: Payer: Medicare HMO | Attending: Family Medicine

## 2019-11-20 VITALS — BP 164/82 | HR 78

## 2019-11-20 DIAGNOSIS — M6281 Muscle weakness (generalized): Secondary | ICD-10-CM | POA: Insufficient documentation

## 2019-11-20 DIAGNOSIS — R2689 Other abnormalities of gait and mobility: Secondary | ICD-10-CM | POA: Diagnosis not present

## 2019-11-20 NOTE — Therapy (Signed)
[facility] [address] [place], [place], [address] Phone: [phone]   Fax:  [phone]  Physical Therapy Treatment  Patient Details  Name: [name] MRN: [id number] Date of Birth: 08/30/1950 Referring Provider (PT): [name]   Encounter Date: 11/20/2019  PT End of Session - 11/20/19 1019    Visit Number  4    Number of Visits  17    Date for PT Re-Evaluation  0000000   90 day cert but 60 day poc   Authorization Type  medicare payer so 10th visit note needed    PT Start Time  1015    PT Stop Time  1100    PT Time Calculation (min)  45 min    Equipment Utilized During Treatment  Gait belt    Activity Tolerance  Patient tolerated treatment well    Behavior During Therapy  [facility] for tasks assessed/performed       Past Medical History:  Diagnosis Date  . Arthritis   . Cameron ulcer, chronic 01/11/2018  . Chronic back pain   . GERD (gastroesophageal reflux disease)    uses Baking Soda  . GIB (gastrointestinal bleeding) 2012  . Glaucoma    right eye  . Headache    occasionally  . Hepatitis C   . Hiatal hernia with gastroesophageal reflux disease and esophagitis 01/05/2018  . History of blood transfusion    no abnormal reaction noted  . History of gout    doesn't take any meds  . Hyperlipidemia    not on any meds  . Hypertension    takes Amlodipine and Lisinopril daily  . Insomnia    takes Ambien nightly  . Ischemic colitis ([place]) 2012  . Joint pain   . Nocturia   . Numbness    both legs occasionally  . PONV (postoperative nausea and vomiting)   . Prostate cancer ([place])   . Shortness of breath dyspnea    rarely but when notices he can be lying/sitting/exertion.Dr.Hochrein is aware per pt  . Stroke ([place]) 2016   . Urinary frequency   . Urinary urgency     Past Surgical History:  Procedure Laterality Date  . APPENDECTOMY    . BIOPSY  01/11/2018   Procedure: BIOPSY;  Surgeon: Gatha Mayer, MD;  Location: [facility] ENDOSCOPY;  Service: Endoscopy;;  . COLONOSCOPY    . COLONOSCOPY N/A 10/26/2015   Procedure: COLONOSCOPY;  Surgeon: Doran Stabler, MD;  Location: [facility] ENDOSCOPY;  Service: Endoscopy;  Laterality: N/A;  . ESOPHAGOGASTRODUODENOSCOPY    . ESOPHAGOGASTRODUODENOSCOPY N/A 10/26/2015   Procedure: ESOPHAGOGASTRODUODENOSCOPY (EGD);  Surgeon: Doran Stabler, MD;  Location: [facility] ENDOSCOPY;  Service: Endoscopy;  Laterality: N/A;  . ESOPHAGOGASTRODUODENOSCOPY (EGD) WITH PROPOFOL N/A 01/11/2018   Procedure: ESOPHAGOGASTRODUODENOSCOPY (EGD) WITH PROPOFOL;  Surgeon: Gatha Mayer, MD;  Location: [facility] ENDOSCOPY;  Service: Endoscopy;  Laterality: N/A;  . INGUINAL HERNIA REPAIR Right 11/04/2014   Procedure: RIGHT INGUINAL HERNIA REPAIR WITH MESH;  Surgeon: Jackolyn Confer, MD;  Location: [facility];  Service: General;  Laterality: Right;  . MASS EXCISION N/A 11/04/2014   Procedure: REMOVAL OF RIGHT GROIN SOFT TISSUE MASS;  Surgeon: Jackolyn Confer, MD;  Location: [facility];  Service: General;  Laterality: N/A;  . Multiple abdominal surgeries     total of 13  . PROSTATECTOMY    . SMALL INTESTINE SURGERY      Vitals:   11/20/19 1017  BP: (!) 164/82  Pulse: 78  Subjective Assessment - 11/20/19 1017    Subjective  Pt reports he is doing ok. Was a little sore last time. Pt is sleepy today. Did not sleep well and continues to have trouble with sleep. Pt has not taken his medications.    Pertinent History  PMH: Right hemiparesis secondary to thalamic hemorrhage 07/20/18, chronic back pain/headaches/left shoulder pain, OA, HTN, anemia, insomnia    Patient Stated Goals  Pt wants to walk better.    Currently in Pain?  Yes    Pain Score  9     Pain Location  Back    Pain Orientation  Lower    Pain Descriptors / Indicators  Aching    Pain Type  Chronic pain    Pain Onset  More than a month ago                       [facility] Adult PT Treatment/Exercise - 11/20/19 1019      Bed  Mobility   Bed Mobility  Supine to Sit;Sit to Supine    Supine to Sit  Supervision/Verbal cueing    Sit to Supine  Supervision/Verbal cueing      Transfers   Transfers  Sit to Stand;Stand to Sit;Stand Pivot Transfers    Sit to Stand  4: Min guard;4: Min assist    Stand to Sit  4: Min guard;4: Min Social research officer, government Transfers  4: Min guard    Comments  Pt used RW for stand/pivot transfer      Ambulation/Gait   Ambulation/Gait  Yes    Ambulation/Gait Assistance  4: Min assist    Ambulation/Gait Assistance Details  Pt was cued to try to stay up in walker for more erect posture and increase right foot clearance.    Ambulation Distance (Feet)  50 Feet    Assistive device  Rolling walker    Gait Pattern  Decreased step length - right;Step-through pattern;Step-to pattern;Poor foot clearance - right;Trunk flexed    Ambulation Surface  Level;Indoor    Gait Comments  BP=170/80      Exercises   Exercises  Other Exercises    Other Exercises   Hooklying pillow squeezes x 10 with 5 sec holds             PT Education - 11/20/19 1122    Education Details  Pt was educated on signs/symptoms of stroke including head ache, slurred speech, arm/left weakness or numbness and to monitor and call EMS back immediately if he is experiencing any of these. Recommended he call a friend to check on him later since he lives alone. PT was recommending pt go to hospital with EMS to get checked out but he declined. Pt was also instructed to take his medications when he got home.    Person(s) Educated  Patient    Methods  Explanation    Comprehension  Verbalized understanding       PT Short Term Goals - 11/15/19 1213      PT SHORT TERM GOAL #1   Title  Pt will be independent with initial HEP for strengthening and balance to continue gains on own. (target dates updated due to missed visits  to 12/06/19)    Time  4    Period  Weeks    Status  New    Target Date  12/06/19      PT SHORT TERM GOAL #2    Title  Pt will increase gait speed  from 0.18m/s to >0.72m/s for improved household mobility.    Baseline  0.69m/s on 10/11/19    Time  4    Period  Weeks    Status  New    Target Date  12/06/19      PT SHORT TERM GOAL #3   Title  Pt will decrease 5 x sit to stand from 43 sec to <30 for improved balance and functional strength.    Baseline  5 x sit to stand on 10/11/19=43 sec from mat    Time  4    Period  Weeks    Status  New    Target Date  12/13/19      PT SHORT TERM GOAL #4   Title  Berg balance will be performed for further assess balance and LTG written.    Baseline  Merrilee Jansky was performed on 11/13/19 with score of 32/56    Time  4    Period  Weeks    Status  Achieved    Target Date  12/13/19        PT Long Term Goals - 11/15/19 1215      PT LONG TERM GOAL #1   Title  Pt will be independent with progressive HEP for strength, balance and walking in order to continue gains at home.    Time  8    Period  Weeks    Status  New    Target Date  01/03/20   target dates updated due to missed visits     PT LONG TERM GOAL #2   Title  Pt will ambulate > 400' with LRAD mod I for improved household and short community distances.    Time  8    Period  Weeks    Status  New    Target Date  01/03/20      PT LONG TERM GOAL #3   Title  Pt will ambulate up/down curb and ramp supervision for improved community access.    Time  8    Period  Weeks    Status  New    Target Date  01/03/20      PT LONG TERM GOAL #4   Title  Pt will decreased 5 x sit to stand from 43 sec to < 20 sec for improved balance and functional strength.    Time  8    Period  Weeks    Status  New    Target Date  01/03/20      PT LONG TERM GOAL #5   Title  Pt will increase Berg Balance from 32/56 to >38/56 for improved balance and decreased fall risk.    Baseline  32/56    Time  8    Period  Weeks    Status  New    Target Date  01/03/20            Plan - 11/20/19 1125    Clinical Impression Statement   Session limited as patient presenting with increased weakness on right side needing more assistance with transfers and gait. Pt leaning to right in sitting. Pt also with slurred speech. PT called EMS to evaluate patient. Vitals were improved some when they arrived and patient refused to go to hospital for further evaluation. PT provided education on signs/symptoms of CVA and instructed patient to monitor and call EMS back if any further changes.    Personal Factors and Comorbidities  Comorbidity 3+    Comorbidities  Right hemiparesis secondary to  thalamic hemorrhage 07/20/18, chronic back pain/headaches/left shoulder pain, OA, HTN, anemia, insomniaPt    Examination-Activity Limitations  Bed Mobility;Locomotion Level;Stand;Transfers;Stairs    Examination-Participation Restrictions  Community Activity;Meal Prep    Stability/Clinical Decision Making  Stable/Uncomplicated    Rehab Potential  Good    PT Frequency  2x / week    PT Duration  8 weeks    PT Treatment/Interventions  ADLs/Self Care Home Management;Cryotherapy;Moist Heat;Electrical Stimulation;DME Instruction;Gait training;Stair training;Functional mobility training;Therapeutic activities;Therapeutic exercise;Orthotic Fit/Training;Patient/family education;Neuromuscular re-education;Balance training;Vestibular;Passive range of motion;Manual techniques    PT Next Visit Plan  How is patient doing since last visit with increased weakness/slurres speech. Monitor BP. Progress standing exercises and determine if safe to add to HEP. Gait training with RW, add balance ex's to HEP.    PT Home Exercise Plan  Access Code: [email or web address]    Consulted and Agree with Plan of Care  Patient       Patient will benefit from skilled therapeutic intervention in order to improve the following deficits and impairments:  Abnormal gait, Decreased activity tolerance, Decreased balance, Decreased strength  Visit Diagnosis: Other abnormalities of gait and mobility  Muscle  weakness (generalized)     Problem List Patient Active Problem List   Diagnosis Date Noted  . Reactive depression 10/10/2019  . Acute on chronic anemia   . Pain   . Agitation 07/26/2018  . Hepatitis C 07/26/2018  . Nontraumatic thalamic hemorrhage ([place]) 07/26/2018  . Osteoarthritis of left hip 04/06/2018  . Cameron ulcer, chronic 01/11/2018  . Symptomatic anemia 01/10/2018  . Syncope 01/10/2018  . Hiatal hernia with gastroesophageal reflux disease and esophagitis 01/05/2018  . Insomnia 01/05/2018  . Rotator cuff syndrome of left shoulder 11/21/2017  . Anemia, iron deficiency   . Gastrointestinal bleeding 10/24/2015  . Tobacco abuse   . Acute ischemic stroke ([place])   . Stroke ([place]) 05/31/2015  . HTN (hypertension) 04/10/2014  . Inguinal hernia without mention of obstruction or gangrene, unilateral or unspecified, (not specified as recurrent)-right 01/30/2014    Electa Sniff, PT, DPT, NCS 11/20/2019, 11:29 AM  [facility] [address] [place], [place], [address] Phone: [phone]   Fax:  [phone]  Name: [name] MRN: [id number] Date of Birth: 10/19/1950

## 2019-11-21 ENCOUNTER — Encounter: Payer: Self-pay | Admitting: Family Medicine

## 2019-11-21 ENCOUNTER — Telehealth (INDEPENDENT_AMBULATORY_CARE_PROVIDER_SITE_OTHER): Payer: Medicare HMO | Admitting: Family Medicine

## 2019-11-21 DIAGNOSIS — Z8673 Personal history of transient ischemic attack (TIA), and cerebral infarction without residual deficits: Secondary | ICD-10-CM | POA: Diagnosis not present

## 2019-11-21 DIAGNOSIS — R21 Rash and other nonspecific skin eruption: Secondary | ICD-10-CM | POA: Diagnosis not present

## 2019-11-21 DIAGNOSIS — I69851 Hemiplegia and hemiparesis following other cerebrovascular disease affecting right dominant side: Secondary | ICD-10-CM | POA: Diagnosis not present

## 2019-11-21 MED ORDER — PROCARE ADULT BRIEFS LARGE MISC: 11 refills | Status: DC

## 2019-11-21 NOTE — Progress Notes (Signed)
Virtual Visit via Telephone Note  I connected with [name] on 11/21/19 at  2:00 PM EDT by telephone and verified that I am speaking with the correct person using two identifiers.   I discussed the limitations, risks, security and privacy concerns of performing an evaluation and management service by telephone and the availability of in person appointments. I also discussed with the patient that there may be a patient responsible charge related to this service. The patient expressed understanding and agreed to proceed.  Location patient: home Location provider: work or home office Participants present for the call: patient, provider Patient did not have a visit in the prior 7 days to address this/these issue(s).  History of Present Illness: Pt is a 69 yo male with pmh sig for HTN, history of CVA thalamic hemorrhage with residual right-sided hemiparesis, hiatal hernia, GERD, h/o GIB, h/o Hep C, rotator cuff syndrome left shoulder, arthritis, HLD, h/o prostate cancer, insomnia, gluacoma, tobacco use, anemia, h/o depression.    Pt states he wants to know how to get a wheelchair, large disposable briefs, and a home aide.  Pt states he got a new phone so his voice mail is now working.  Pt states rehab is "going".  Pt mentions a rash on his bottom. States it is painful at times when he sits.  He tried vaseline and alcohol on the spots.   Observations/Objective: Patient sounds cheerful and well on the phone. I do not appreciate any SOB. Speech and thought processing are grossly intact. Patient reported vitals:  Assessment and Plan: History of CVA (cerebrovascular accident) -stable -continue bp and cholesterol -will look into resources for a home aide for pt. -Continue Norvasc 10 mg, hydralazine 10 mg 3 times daily, lisinopril 40 mg.  Hemiparesis of right dominant side as late effect of other cerebrovascular disease ([place])  -continue PT with outpatient rehab. -will discuss with  therapist if pt needs a wheelchair.  Pt also seen by PM&R who can assist with this if needed. - Plan: Incontinence Supply Disposable (PROCARE ADULT BRIEFS LARGE) MISC  Rash -pt advised of limited ability via phone visit as unable to see rash. -discussed supportive care -pt advised to f/u in person   Follow Up Instructions:  F/u in the next few months  I did not refer this patient for an OV in the next 24 hours for this/these issue(s).  I discussed the assessment and treatment plan with the patient. The patient was provided an opportunity to ask questions and all were answered. The patient agreed with the plan and demonstrated an understanding of the instructions.   The patient was advised to call back or seek an in-person evaluation if the symptoms worsen or if the condition fails to improve as anticipated.  I provided 10 minutes of non-face-to-face time during this encounter.   Billie Ruddy, MD

## 2019-11-22 ENCOUNTER — Other Ambulatory Visit: Payer: Self-pay

## 2019-11-22 ENCOUNTER — Ambulatory Visit: Payer: Medicare HMO

## 2019-11-22 ENCOUNTER — Telehealth: Payer: Self-pay

## 2019-11-22 VITALS — BP 138/72

## 2019-11-22 DIAGNOSIS — R2689 Other abnormalities of gait and mobility: Secondary | ICD-10-CM | POA: Diagnosis not present

## 2019-11-22 DIAGNOSIS — M6281 Muscle weakness (generalized): Secondary | ICD-10-CM

## 2019-11-22 NOTE — Therapy (Signed)
[facility] [address] [place], [place], [address] Phone: [phone]   Fax:  [phone]  Physical Therapy Treatment  Patient Details  Name: [name] MRN: [id number] Date of Birth: 11-27-1950 Referring Provider (PT): [name]   Encounter Date: 11/22/2019  PT End of Session - 11/22/19 1021    Visit Number  5    Number of Visits  17    Date for PT Re-Evaluation  0000000   90 day cert but 60 day poc   Authorization Type  medicare payer so 10th visit note needed    PT Start Time  1018    PT Stop Time  1057    PT Time Calculation (min)  39 min    Equipment Utilized During Treatment  Gait belt    Activity Tolerance  Patient tolerated treatment well    Behavior During Therapy  [facility] for tasks assessed/performed       Past Medical History:  Diagnosis Date  . Arthritis   . Cameron ulcer, chronic 01/11/2018  . Chronic back pain   . GERD (gastroesophageal reflux disease)    uses Baking Soda  . GIB (gastrointestinal bleeding) 2012  . Glaucoma    right eye  . Headache    occasionally  . Hepatitis C   . Hiatal hernia with gastroesophageal reflux disease and esophagitis 01/05/2018  . History of blood transfusion    no abnormal reaction noted  . History of gout    doesn't take any meds  . Hyperlipidemia    not on any meds  . Hypertension    takes Amlodipine and Lisinopril daily  . Insomnia    takes Ambien nightly  . Ischemic colitis ([place]) 2012  . Joint pain   . Nocturia   . Numbness    both legs occasionally  . PONV (postoperative nausea and vomiting)   . Prostate cancer ([place])   . Shortness of breath dyspnea    rarely but when notices he can be lying/sitting/exertion.Dr.Hochrein is aware per pt  . Stroke ([place]) 2016   . Urinary frequency   . Urinary urgency     Past Surgical History:  Procedure Laterality Date  . APPENDECTOMY    . BIOPSY  01/11/2018   Procedure: BIOPSY;  Surgeon: Gatha Mayer, MD;  Location: [facility] ENDOSCOPY;  Service: Endoscopy;;  . COLONOSCOPY    . COLONOSCOPY N/A 10/26/2015   Procedure: COLONOSCOPY;  Surgeon: Doran Stabler, MD;  Location: [facility] ENDOSCOPY;  Service: Endoscopy;  Laterality: N/A;  . ESOPHAGOGASTRODUODENOSCOPY    . ESOPHAGOGASTRODUODENOSCOPY N/A 10/26/2015   Procedure: ESOPHAGOGASTRODUODENOSCOPY (EGD);  Surgeon: Doran Stabler, MD;  Location: [facility] ENDOSCOPY;  Service: Endoscopy;  Laterality: N/A;  . ESOPHAGOGASTRODUODENOSCOPY (EGD) WITH PROPOFOL N/A 01/11/2018   Procedure: ESOPHAGOGASTRODUODENOSCOPY (EGD) WITH PROPOFOL;  Surgeon: Gatha Mayer, MD;  Location: [facility] ENDOSCOPY;  Service: Endoscopy;  Laterality: N/A;  . INGUINAL HERNIA REPAIR Right 11/04/2014   Procedure: RIGHT INGUINAL HERNIA REPAIR WITH MESH;  Surgeon: Jackolyn Confer, MD;  Location: [facility];  Service: General;  Laterality: Right;  . MASS EXCISION N/A 11/04/2014   Procedure: REMOVAL OF RIGHT GROIN SOFT TISSUE MASS;  Surgeon: Jackolyn Confer, MD;  Location: [facility];  Service: General;  Laterality: N/A;  . Multiple abdominal surgeries     total of 13  . PROSTATECTOMY    . SMALL INTESTINE SURGERY      Vitals:   11/22/19 1020  BP: 138/72    Subjective Assessment -  11/22/19 1020    Subjective  Pt reports that he had a telehealth visit yesterday with doctor. He denies any other changes since the other day. Still feeling week. Pt reports that he did talk to his MD about needing a new powerchair. States that he bought his current one off someone and keeps breaking down. Pt wants to pursue trying to get a new one. Voiced frustration with process in the past.    Pertinent History  PMH: Right hemiparesis secondary to thalamic hemorrhage 07/20/18, chronic back pain/headaches/left shoulder pain, OA, HTN, anemia, insomnia    Patient Stated Goals  Pt wants to walk better.    Currently in Pain?  Yes    Pain Score  9     Pain Location  Back    Pain Orientation  Lower    Pain Descriptors / Indicators   Aching    Pain Type  Chronic pain    Pain Onset  More than a month ago                       [facility] Adult PT Treatment/Exercise - 11/22/19 1031      Transfers   Transfers  Sit to Stand;Stand to Sit    Sit to Stand  4: Min guard    Stand to Sit  4: Min guard    Stand Pivot Transfers  4: Min guard    Comments  Stand pivot with RW.      Ambulation/Gait   Ambulation/Gait  Yes    Ambulation/Gait Assistance  4: Min guard    Ambulation/Gait Assistance Details  Pt was cued to increase right foot clearance and try to step out to side a little more as well as stand erect.    Ambulation Distance (Feet)  115 Feet    Assistive device  Rolling walker   w/c follow   Gait Pattern  Step-through pattern;Decreased step length - right;Decreased hip/knee flexion - right;Trunk flexed;Narrow base of support    Ambulation Surface  Level;Indoor      Neuro Re-ed    Neuro Re-ed Details   Standing at walker with bilateral UE support marching in place x 10 each leg CGA. Raising up on toes x 10 CGA. Standing without UE support x 30 sec with verbal cues to stand more erect.      Exercises   Exercises  Other Exercises    Other Exercises   Seated edge of mat: ankle pumps x 20 bilateral. LAQ x 10 to each side with verbal cues for erect posture, hip abd/adduction with manual resistance x 10 each. Coming down on forearm to each side x 5 and pushing back up to get more core activation then repeated with reaching across when down with opposite arm x 5. Pt was given verbal cues for form throughout.             PT Education - 11/22/19 1214    Education Details  Pt to continue with curent HEP. PT explained that if he wished to pursue power wheelchair it was a process that involved MD order going to vendor, most likely wheelchair evaluation by PT then insurance approval prior to being able to order chair. PT explained that powerchairs were only approved for in home to qualify. They do not look at  community mobility. He wished to start process.    Person(s) Educated  Patient    Methods  Explanation    Comprehension  Verbalized understanding  PT Short Term Goals - 11/15/19 1213      PT SHORT TERM GOAL #1   Title  Pt will be independent with initial HEP for strengthening and balance to continue gains on own. (target dates updated due to missed visits  to 12/06/19)    Time  4    Period  Weeks    Status  New    Target Date  12/06/19      PT SHORT TERM GOAL #2   Title  Pt will increase gait speed from 0.76m/s to >0.52m/s for improved household mobility.    Baseline  0.62m/s on 10/11/19    Time  4    Period  Weeks    Status  New    Target Date  12/06/19      PT SHORT TERM GOAL #3   Title  Pt will decrease 5 x sit to stand from 43 sec to <30 for improved balance and functional strength.    Baseline  5 x sit to stand on 10/11/19=43 sec from mat    Time  4    Period  Weeks    Status  New    Target Date  12/13/19      PT SHORT TERM GOAL #4   Title  Berg balance will be performed for further assess balance and LTG written.    Baseline  Merrilee Jansky was performed on 11/13/19 with score of 32/56    Time  4    Period  Weeks    Status  Achieved    Target Date  12/13/19        PT Long Term Goals - 11/15/19 1215      PT LONG TERM GOAL #1   Title  Pt will be independent with progressive HEP for strength, balance and walking in order to continue gains at home.    Time  8    Period  Weeks    Status  New    Target Date  01/03/20   target dates updated due to missed visits     PT LONG TERM GOAL #2   Title  Pt will ambulate > 400' with LRAD mod I for improved household and short community distances.    Time  8    Period  Weeks    Status  New    Target Date  01/03/20      PT LONG TERM GOAL #3   Title  Pt will ambulate up/down curb and ramp supervision for improved community access.    Time  8    Period  Weeks    Status  New    Target Date  01/03/20      PT LONG TERM GOAL #4    Title  Pt will decreased 5 x sit to stand from 43 sec to < 20 sec for improved balance and functional strength.    Time  8    Period  Weeks    Status  New    Target Date  01/03/20      PT LONG TERM GOAL #5   Title  Pt will increase Berg Balance from 32/56 to >38/56 for improved balance and decreased fall risk.    Baseline  32/56    Time  8    Period  Weeks    Status  New    Target Date  01/03/20            Plan - 11/22/19 1216    Clinical Impression Statement  Pt was doing better at visit today with less right lean and speech improved. He did not seek any further medical interventions after leaving the other day. Pt was able to increase gait with better right foot clearance compared to last visit. Does fatigue quickly.    Personal Factors and Comorbidities  Comorbidity 3+    Comorbidities  Right hemiparesis secondary to thalamic hemorrhage 07/20/18, chronic back pain/headaches/left shoulder pain, OA, HTN, anemia, insomniaPt    Examination-Activity Limitations  Bed Mobility;Locomotion Level;Stand;Transfers;Stairs    Examination-Participation Restrictions  Community Activity;Meal Prep    Stability/Clinical Decision Making  Stable/Uncomplicated    Rehab Potential  Good    PT Frequency  2x / week    PT Duration  8 weeks    PT Treatment/Interventions  ADLs/Self Care Home Management;Cryotherapy;Moist Heat;Electrical Stimulation;DME Instruction;Gait training;Stair training;Functional mobility training;Therapeutic activities;Therapeutic exercise;Orthotic Fit/Training;Patient/family education;Neuromuscular re-education;Balance training;Vestibular;Passive range of motion;Manual techniques    PT Next Visit Plan  Monitor BP. Progress standing exercises and determine if safe to add to HEP. Gait training with RW, add balance ex's to HEP.    PT Home Exercise Plan  Access Code: [email or web address]    Recommended Other Services  PT sent note to Dr. Volanda Napoleon about patient requesting power wheelchair  referral/order.    Consulted and Agree with Plan of Care  Patient       Patient will benefit from skilled therapeutic intervention in order to improve the following deficits and impairments:  Abnormal gait, Decreased activity tolerance, Decreased balance, Decreased strength  Visit Diagnosis: Other abnormalities of gait and mobility  Muscle weakness (generalized)     Problem List Patient Active Problem List   Diagnosis Date Noted  . Reactive depression 10/10/2019  . Acute on chronic anemia   . Pain   . Agitation 07/26/2018  . Hepatitis C 07/26/2018  . Nontraumatic thalamic hemorrhage ([place]) 07/26/2018  . Osteoarthritis of left hip 04/06/2018  . Cameron ulcer, chronic 01/11/2018  . Symptomatic anemia 01/10/2018  . Syncope 01/10/2018  . Hiatal hernia with gastroesophageal reflux disease and esophagitis 01/05/2018  . Insomnia 01/05/2018  . Rotator cuff syndrome of left shoulder 11/21/2017  . Anemia, iron deficiency   . Gastrointestinal bleeding 10/24/2015  . Tobacco abuse   . Acute ischemic stroke ([place])   . Stroke ([place]) 05/31/2015  . HTN (hypertension) 04/10/2014  . Inguinal hernia without mention of obstruction or gangrene, unilateral or unspecified, (not specified as recurrent)-right 01/30/2014    Electa Sniff, PT, DPT, NCS 11/22/2019, 12:19 PM  [facility] [address] [place], [place], [address] Phone: [phone]   Fax:  [phone]  Name: [name] MRN: [id number] Date of Birth: 09/15/1950

## 2019-11-22 NOTE — Telephone Encounter (Signed)
I too have tried to explain the process to the pt.  Our last visit was telephone call.  Pt also seen by PM&R, Dr. Naaman Plummer. I am ok with placing the referral.

## 2019-11-22 NOTE — Telephone Encounter (Signed)
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  He is requesting order for power wheelchair. He currently has one that he reports keeps breaking down that he purchased himself second hand a year ago. I explained the process for trying to obtain one through insurance that would better fit his needs if he qualified and he wished to pursue. Could you please submit referral for power wheelchair evaluation in EPIC or fax to [facility] at [phone] if you agree. If you have any notes that would work for face to face component that would be helpful as well.  Thank you, [name], PT, DPT, [facility] [address] [place], [place]  [address] Phone:  [phone] Fax:  [phone]

## 2019-11-27 ENCOUNTER — Ambulatory Visit: Payer: Medicare HMO

## 2019-11-29 ENCOUNTER — Telehealth: Payer: Self-pay

## 2019-11-29 ENCOUNTER — Other Ambulatory Visit: Payer: Self-pay

## 2019-11-29 ENCOUNTER — Ambulatory Visit: Payer: Medicare HMO

## 2019-11-29 VITALS — BP 220/100

## 2019-11-29 NOTE — Telephone Encounter (Signed)
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 At visit today it was 220/110 when he arrived. He was asymptomatic otherwise. Last week I had called EMS due to it being high and he was having increased weakness and some slurred speech. He had refused to go to the ER to get further assessed. I witheld treatment today. He was refusing to get assessed further. Reports he had taken his meds already about 2 hours prior. I educated on importance of monitoring and to seek medical assistance if did not go down or had any stroke symptoms. He also reports that he is not sleeping or eating well with poor appetite. Wondering if he needs an in person visit to follow-up at your office about these things. Thanks for your help. [name], PT, DPT, NCS

## 2019-11-29 NOTE — Therapy (Signed)
[facility] [address] [place], [place], [address] Phone: [phone]   Fax:  [phone]  Physical Therapy Arrived no charge  Patient Details  Name: [name] MRN: [id number] Date of Birth: [birth year] Referring Provider (PT): [name]   Encounter Date: 11/29/2019  PT End of Session - 11/29/19 0946    Visit Number  5    Number of Visits  17    Date for PT Re-Evaluation  0000000   90 day cert but 60 day poc   Authorization Type  medicare payer so 10th visit note needed    PT Start Time  [phone]   PT running behind   PT Stop Time  1000    PT Time Calculation (min)  22 min    Equipment Utilized During Treatment  Gait belt    Activity Tolerance  Patient tolerated treatment well    Behavior During Therapy  [facility] for tasks assessed/performed       Past Medical History:  Diagnosis Date  . Arthritis   . Cameron ulcer, chronic 01/11/2018  . Chronic back pain   . GERD (gastroesophageal reflux disease)    uses Baking Soda  . GIB (gastrointestinal bleeding) 2012  . Glaucoma    right eye  . Headache    occasionally  . Hepatitis C   . Hiatal hernia with gastroesophageal reflux disease and esophagitis 01/05/2018  . History of blood transfusion    no abnormal reaction noted  . History of gout    doesn't take any meds  . Hyperlipidemia    not on any meds  . Hypertension    takes Amlodipine and Lisinopril daily  . Insomnia    takes Ambien nightly  . Ischemic colitis ([place]) 2012  . Joint pain   . Nocturia   . Numbness    both legs occasionally  . PONV (postoperative nausea and vomiting)   . Prostate cancer ([place])   . Shortness of breath dyspnea    rarely but when notices he can be lying/sitting/exertion.Dr.Hochrein is aware per pt  . Stroke ([place]) 2016   . Urinary frequency   . Urinary urgency     Past Surgical History:  Procedure Laterality Date  . APPENDECTOMY    . BIOPSY  01/11/2018   Procedure:  BIOPSY;  Surgeon: Gatha Mayer, MD;  Location: [facility] ENDOSCOPY;  Service: Endoscopy;;  . COLONOSCOPY    . COLONOSCOPY N/A 10/26/2015   Procedure: COLONOSCOPY;  Surgeon: Doran Stabler, MD;  Location: [facility] ENDOSCOPY;  Service: Endoscopy;  Laterality: N/A;  . ESOPHAGOGASTRODUODENOSCOPY    . ESOPHAGOGASTRODUODENOSCOPY N/A 10/26/2015   Procedure: ESOPHAGOGASTRODUODENOSCOPY (EGD);  Surgeon: Doran Stabler, MD;  Location: [facility] ENDOSCOPY;  Service: Endoscopy;  Laterality: N/A;  . ESOPHAGOGASTRODUODENOSCOPY (EGD) WITH PROPOFOL N/A 01/11/2018   Procedure: ESOPHAGOGASTRODUODENOSCOPY (EGD) WITH PROPOFOL;  Surgeon: Gatha Mayer, MD;  [facility] ENDOSCOPY;  Service: Endoscopy;  Laterality: N/A;  . INGUINAL HERNIA REPAIR Right 11/04/2014   Procedure: RIGHT INGUINAL HERNIA REPAIR WITH MESH;  Surgeon: Jackolyn Confer, MD;  Location: [facility];  Service: General;  Laterality: Right;  . MASS EXCISION N/A 11/04/2014   Procedure: REMOVAL OF RIGHT GROIN SOFT TISSUE MASS;  Surgeon: Jackolyn Confer, MD;  Location: [facility];  Service: General;  Laterality: N/A;  . Multiple abdominal surgeries     total of 13  . PROSTATECTOMY    . SMALL INTESTINE SURGERY      Vitals:   11/29/19 0945 11/29/19 [id number]  BP: (!) 220/110 (!) 220/100    Subjective Assessment - 11/29/19 0945    Subjective  Pt reports that powerchair battery keeps dieing. He wants to know if he can work on hand more. Continues to have leg/back pain. Pt also reports that he is still not sleeping and has poor appetite.    Pertinent History  PMH: Right hemiparesis secondary to thalamic hemorrhage 07/20/18, chronic back pain/headaches/left shoulder pain, OA, HTN, anemia, insomnia    Patient Stated Goals  Pt wants to walk better.    Currently in Pain?  Yes    Pain Score  7     Pain Location  Back    Pain Orientation  Right    Pain Descriptors / Indicators  Aching    Pain Type  Chronic pain    Pain Radiating Towards  does refer in to right thigh    Pain Onset  More  than a month ago    Pain Frequency  Constant                                PT Education - 11/29/19 1001    Education Details  Pt educated on signs/symptoms of stroke again including headache, increased weakness, numbness. Advised patient to check BP when gets home and be sure it is coming back down. If not or any symptoms arise he was instructed to call 911.    Person(s) Educated  Patient    Methods  Explanation    Comprehension  Verbalized understanding       PT Short Term Goals - 11/15/19 1213      PT SHORT TERM GOAL #1   Title  Pt will be independent with initial HEP for strengthening and balance to continue gains on own. (target dates updated due to missed visits  to 12/06/19)    Time  4    Period  Weeks    Status  New    Target Date  12/06/19      PT SHORT TERM GOAL #2   Title  Pt will increase gait speed from 0.36m/s to >0.65m/s for improved household mobility.    Baseline  0.76m/s on 10/11/19    Time  4    Period  Weeks    Status  New    Target Date  12/06/19      PT SHORT TERM GOAL #3   Title  Pt will decrease 5 x sit to stand from 43 sec to <30 for improved balance and functional strength.    Baseline  5 x sit to stand on 10/11/19=43 sec from mat    Time  4    Period  Weeks    Status  New    Target Date  12/13/19      PT SHORT TERM GOAL #4   Title  Berg balance will be performed for further assess balance and LTG written.    Baseline  Merrilee Jansky was performed on 11/13/19 with score of 32/56    Time  4    Period  Weeks    Status  Achieved    Target Date  12/13/19        PT Long Term Goals - 11/15/19 1215      PT LONG TERM GOAL #1   Title  Pt will be independent with progressive HEP for strength, balance and walking in order to continue gains at home.    Time  8  Period  Weeks    Status  New    Target Date  01/03/20   target dates updated due to missed visits     PT LONG TERM GOAL #2   Title  Pt will ambulate > 400' with LRAD mod I  for improved household and short community distances.    Time  8    Period  Weeks    Status  New    Target Date  01/03/20      PT LONG TERM GOAL #3   Title  Pt will ambulate up/down curb and ramp supervision for improved community access.    Time  8    Period  Weeks    Status  New    Target Date  01/03/20      PT LONG TERM GOAL #4   Title  Pt will decreased 5 x sit to stand from 43 sec to < 20 sec for improved balance and functional strength.    Time  8    Period  Weeks    Status  New    Target Date  01/03/20      PT LONG TERM GOAL #5   Title  Pt will increase Berg Balance from 32/56 to >38/56 for improved balance and decreased fall risk.    Baseline  32/56    Time  8    Period  Weeks    Status  New    Target Date  01/03/20            Plan - 11/29/19 1001    Clinical Impression Statement  Pt treatment witheld due to extremely high BP. Pt was asymptomatic. Refused to go to hospital. PT instructed to monitor BP and if develops any stroke symptoms to call 911. PT will also reach out to Dr. Volanda Napoleon to notified of continued issues with BP being high. Will also check on status of wheelchair referral and request OT referral. Arrived no charge for visit since treatment witheld.    Personal Factors and Comorbidities  Comorbidity 3+    Comorbidities  Right hemiparesis secondary to thalamic hemorrhage 07/20/18, chronic back pain/headaches/left shoulder pain, OA, HTN, anemia, insomniaPt    Examination-Activity Limitations  Bed Mobility;Locomotion Level;Stand;Transfers;Stairs    Examination-Participation Restrictions  Community Activity;Meal Prep    Stability/Clinical Decision Making  Stable/Uncomplicated    Rehab Potential  Good    PT Frequency  2x / week    PT Duration  8 weeks    PT Treatment/Interventions  ADLs/Self Care Home Management;Cryotherapy;Moist Heat;Electrical Stimulation;DME Instruction;Gait training;Stair training;Functional mobility training;Therapeutic  activities;Therapeutic exercise;Orthotic Fit/Training;Patient/family education;Neuromuscular re-education;Balance training;Vestibular;Passive range of motion;Manual techniques    PT Next Visit Plan  Monitor BP. Progress standing exercises and determine if safe to add to HEP. Gait training with RW, add balance ex's to HEP.    PT Home Exercise Plan  Access Code: [email or web address]    Recommended Other Services  OT referral requested    Consulted and Agree with Plan of Care  Patient       Patient will benefit from skilled therapeutic intervention in order to improve the following deficits and impairments:  Abnormal gait, Decreased activity tolerance, Decreased balance, Decreased strength  Visit Diagnosis: Muscle weakness (generalized)     Problem List Patient Active Problem List   Diagnosis Date Noted  . Reactive depression 10/10/2019  . Acute on chronic anemia   . Pain   . Agitation 07/26/2018  . Hepatitis C 07/26/2018  . Nontraumatic thalamic hemorrhage ([place]) 07/26/2018  .  Osteoarthritis of left hip 04/06/2018  . Cameron ulcer, chronic 01/11/2018  . Symptomatic anemia 01/10/2018  . Syncope 01/10/2018  . Hiatal hernia with gastroesophageal reflux disease and esophagitis 01/05/2018  . Insomnia 01/05/2018  . Rotator cuff syndrome of left shoulder 11/21/2017  . Anemia, iron deficiency   . Gastrointestinal bleeding 10/24/2015  . Tobacco abuse   . Acute ischemic stroke ([place])   . Stroke ([place]) 05/31/2015  . HTN (hypertension) 04/10/2014  . Inguinal hernia without mention of obstruction or gangrene, unilateral or unspecified, (not specified as recurrent)-right 01/30/2014    Electa Sniff, PT, DPT, NCS 11/29/2019, 10:05 AM  [facility] [address] [place], [place], [address] Phone: [phone]   Fax:  [phone]  Name: [name] MRN: [id number] Date of Birth: 1950-08-20

## 2019-11-29 NOTE — Telephone Encounter (Signed)
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  The patient would benefit from OT evaluation for further assessment of right arm/hand as reporting issues with use affecting ADLs and writing since CVA.   If you agree, please place an order in [facility] workque in [facility] or fax the order to [phone]. Thank you, [name], PT, DPT, [facility] [address] [place], [place]  [address] Phone:  [phone] Fax:  [phone]

## 2019-11-29 NOTE — Telephone Encounter (Signed)
Are you sending over the referral for the power wheelchair evaluation? I did not see it in the system but want to make sure I don't miss it to at least get the process started. Thanks so much. Thank you for the referral of this patient. [name], PT, DPT, NCS

## 2019-11-30 ENCOUNTER — Other Ambulatory Visit: Payer: Self-pay

## 2019-11-30 DIAGNOSIS — Z0189 Encounter for other specified special examinations: Secondary | ICD-10-CM

## 2019-11-30 NOTE — Telephone Encounter (Signed)
Please advise if ok to send in referral for eval for a power wheelchair as requested

## 2019-11-30 NOTE — Telephone Encounter (Signed)
Pt referral for the power wheelchair evaluation placed on Epic

## 2019-11-30 NOTE — Telephone Encounter (Signed)
ok 

## 2019-12-04 ENCOUNTER — Telehealth: Payer: Self-pay | Admitting: Family Medicine

## 2019-12-04 ENCOUNTER — Ambulatory Visit: Payer: Medicare HMO

## 2019-12-04 NOTE — Telephone Encounter (Signed)
Attempted to call [name] with [facility] regarding pt concerns no option to speak with a [employer] nurse or leave a message, pt refills are up to date, [facility] referral placed previously and due to trouble getting in touch with pt the referral was closed.

## 2019-12-04 NOTE — Telephone Encounter (Signed)
Seroquel not done by this provider.  Per med review pt should have enough until June 20th?.  Will see if [facility] will be willing to try again with pt.

## 2019-12-04 NOTE — Telephone Encounter (Signed)
Rhea Bleacher from [facility] states that the pt informed her that his bp is 176/99, difficulty managing in his home due to right sided weakness from the stroke, and that when she spoke about his meds-it sounds like he is taking more than prescribed. She is wondering if we can do a Home Health Referral to assist the pt. She also stated that with being on all three blood pressure medications and his bp being high she wants the PCP to review the medications and that the pt has not been able to do PT b/c they will not work with him if his BP is high.   Pt also needs medication refilled.  Medication Refill:  Seroquel  Lisinopril  Amlodipine  Hydralazine   Pharmacy: [facility]: [phone]

## 2019-12-05 ENCOUNTER — Encounter: Payer: Medicare HMO | Attending: Physical Medicine & Rehabilitation | Admitting: Physical Medicine & Rehabilitation

## 2019-12-05 ENCOUNTER — Encounter: Payer: Self-pay | Admitting: Physical Medicine & Rehabilitation

## 2019-12-05 ENCOUNTER — Other Ambulatory Visit: Payer: Self-pay

## 2019-12-05 VITALS — BP 171/77 | HR 68 | Temp 97.9°F | Ht 69.5 in | Wt 210.0 lb

## 2019-12-05 DIAGNOSIS — F419 Anxiety disorder, unspecified: Secondary | ICD-10-CM | POA: Insufficient documentation

## 2019-12-05 DIAGNOSIS — I619 Nontraumatic intracerebral hemorrhage, unspecified: Secondary | ICD-10-CM

## 2019-12-05 DIAGNOSIS — I10 Essential (primary) hypertension: Secondary | ICD-10-CM | POA: Diagnosis not present

## 2019-12-05 DIAGNOSIS — F5101 Primary insomnia: Secondary | ICD-10-CM

## 2019-12-05 DIAGNOSIS — M75102 Unspecified rotator cuff tear or rupture of left shoulder, not specified as traumatic: Secondary | ICD-10-CM | POA: Diagnosis not present

## 2019-12-05 DIAGNOSIS — F329 Major depressive disorder, single episode, unspecified: Secondary | ICD-10-CM | POA: Diagnosis not present

## 2019-12-05 DIAGNOSIS — R6889 Other general symptoms and signs: Secondary | ICD-10-CM | POA: Diagnosis not present

## 2019-12-05 DIAGNOSIS — I69851 Hemiplegia and hemiparesis following other cerebrovascular disease affecting right dominant side: Secondary | ICD-10-CM | POA: Diagnosis not present

## 2019-12-05 MED ORDER — HYDRALAZINE HCL 10 MG PO TABS: 10.0000 mg | ORAL_TABLET | Freq: Three times a day (TID) | ORAL | 3 refills | Status: DC

## 2019-12-05 NOTE — Patient Instructions (Signed)
PLEASE FEEL FREE TO CALL OUR OFFICE WITH ANY PROBLEMS OR QUESTIONS [id number])     TAKE HYDRALAZINE 10MG  THREE X DAILY

## 2019-12-05 NOTE — Progress Notes (Signed)
Subjective:    Patient ID: [name], male    DOB: [birth year], 69 y.o.   MRN: [id number]  HPI   [name] is here in follow up of his left thalamic hemorrhage and associated deficits. He states that he's been doing pretty well since I last saw him. His sleep is better for the most part. He usually goes to bed around 8-9pm and falls asleep watching TV. He is using seroquel 200mg  currently. He hasn't gotten melatonin yet.   He has continue with therapies at outpatient neuro rehab.  He feels that he needs more help at home than he has.  His family does seem to help intermittently.  Uses a wheelchair for longer distances.  Left shoulder remains problematic at times and can come and go from a standpoint of the pain.  Blood pressure has been elevated still as well and even because therapy to stop short last week.  He is on Norvasc 10 mg daily as well as lisinopril 40 mg daily.  He is only taking hydralazine 10 mg at bedtime currently.  Pain Inventory Average Pain 8 Pain Right Now 8 My pain is constant  In the last 24 hours, has pain interfered with the following? General activity n/a Relation with others n/a Enjoyment of life n/a What TIME of day is your pain at its worst? always Sleep (in general) Fair  Pain is worse with: unsure Pain improves with: nothing Relief from Meds: 0  Mobility use a wheelchair needs help with transfers  Function disabled: date disabled .  Neuro/Psych trouble walking  Prior Studies Any changes since last visit?  no  Physicians involved in your care Any changes since last visit?  yes   Family History  Problem Relation Age of Onset  . Alzheimer's disease Father   . Cancer Mother        Lymph node  . Heart failure Brother 45       Transplant 13 years ago  . CAD Brother   . Heart disease Sister 66       MI   Social History   Socioeconomic History  . Marital status: Legally Separated    Spouse name: Not on file  . Number of  children: 2  . Years of education: Not on file  . Highest education level: Not on file  Occupational History  . Not on file  Tobacco Use  . Smoking status: Current Every Day Smoker    Packs/day: 0.50    Years: 50.00    Pack years: 25.00    Types: Cigarettes  . Smokeless tobacco: Never Used  Substance and Sexual Activity  . Alcohol use: Yes    Alcohol/week: 0.0 standard drinks    Comment: occasionally  . Drug use: No    Frequency: 5.0 times per week  . Sexual activity: Yes  Other Topics Concern  . Not on file  Social History Narrative   One living child .  One murdered.  Lives with girlfriend.     Social Determinants of Health   Financial Resource Strain:   . Difficulty of Paying Living Expenses:   Food Insecurity:   . Worried About Charity fundraiser in the Last Year:   . Arboriculturist in the Last Year:   Transportation Needs:   . Film/video editor (Medical):   Marland Kitchen Lack of Transportation (Non-Medical):   Physical Activity:   . Days of Exercise per Week:   . Minutes of Exercise per  Session:   Stress:   . Feeling of Stress :   Social Connections:   . Frequency of Communication with Friends and Family:   . Frequency of Social Gatherings with Friends and Family:   . Attends Religious Services:   . Active Member of Clubs or Organizations:   . Attends Archivist Meetings:   Marland Kitchen Marital Status:    Past Surgical History:  Procedure Laterality Date  . APPENDECTOMY    . BIOPSY  01/11/2018   Procedure: BIOPSY;  Surgeon: Gatha Mayer, MD;  Location: [facility] ENDOSCOPY;  Service: Endoscopy;;  . COLONOSCOPY    . COLONOSCOPY N/A 10/26/2015   Procedure: COLONOSCOPY;  Surgeon: Doran Stabler, MD;  Location: [facility] ENDOSCOPY;  Service: Endoscopy;  Laterality: N/A;  . ESOPHAGOGASTRODUODENOSCOPY    . ESOPHAGOGASTRODUODENOSCOPY N/A 10/26/2015   Procedure: ESOPHAGOGASTRODUODENOSCOPY (EGD);  Surgeon: Doran Stabler, MD;  Location: [facility] ENDOSCOPY;  Service: Endoscopy;   Laterality: N/A;  . ESOPHAGOGASTRODUODENOSCOPY (EGD) WITH PROPOFOL N/A 01/11/2018   Procedure: ESOPHAGOGASTRODUODENOSCOPY (EGD) WITH PROPOFOL;  Surgeon: Gatha Mayer, MD;  Location: [facility] ENDOSCOPY;  Service: Endoscopy;  Laterality: N/A;  . INGUINAL HERNIA REPAIR Right 11/04/2014   Procedure: RIGHT INGUINAL HERNIA REPAIR WITH MESH;  Surgeon: Jackolyn Confer, MD;  Location: [place];  Service: General;  Laterality: Right;  . MASS EXCISION N/A 11/04/2014   Procedure: REMOVAL OF RIGHT GROIN SOFT TISSUE MASS;  Surgeon: Jackolyn Confer, MD;  Location: [place];  Service: General;  Laterality: N/A;  . Multiple abdominal surgeries     total of 13  . PROSTATECTOMY    . SMALL INTESTINE SURGERY     Past Medical History:  Diagnosis Date  . Arthritis   . Cameron ulcer, chronic 01/11/2018  . Chronic back pain   . GERD (gastroesophageal reflux disease)    uses Baking Soda  . GIB (gastrointestinal bleeding) 2012  . Glaucoma    right eye  . Headache    occasionally  . Hepatitis C   . Hiatal hernia with gastroesophageal reflux disease and esophagitis 01/05/2018  . History of blood transfusion    no abnormal reaction noted  . History of gout    doesn't take any meds  . Hyperlipidemia    not on any meds  . Hypertension    takes Amlodipine and Lisinopril daily  . Insomnia    takes Ambien nightly  . Ischemic colitis ([place]) 2012  . Joint pain   . Nocturia   . Numbness    both legs occasionally  . PONV (postoperative nausea and vomiting)   . Prostate cancer ([place])   . Shortness of breath dyspnea    rarely but when notices he can be lying/sitting/exertion.Dr.Hochrein is aware per pt  . Stroke ([place]) 2016   . Urinary frequency   . Urinary urgency    BP (!) 171/77   Pulse 68   Temp 97.9 F (36.6 C)   Ht 5' 9.5" (1.765 m)   Wt 210 lb (95.3 kg)   SpO2 98%   BMI 30.57 kg/m   Opioid Risk Score:   Fall Risk Score:  `1  Depression screen PHQ 2/9  Depression screen [facility] 2/9 09/19/2019 09/19/2019  10/31/2018 10/18/2017 08/29/2017 07/11/2017 06/23/2017  Decreased Interest 1 1 0 0 1 0 0  Down, Depressed, Hopeless 2 2 0 0 1 0 0  PHQ - 2 Score 3 3 0 0 2 0 0  Altered sleeping 3 3 - - 3 - -  Tired, decreased  energy 2 2 - - 2 - -  Change in appetite 3 3 - - 3 - -  Feeling bad or failure about yourself  1 1 - - 1 - -  Trouble concentrating 1 1 - - 3 - -  Moving slowly or fidgety/restless 1 1 - - 3 - -  Suicidal thoughts 0 0 - - 0 - -  PHQ-9 Score 14 14 - - 17 - -  Difficult doing work/chores Somewhat difficult Somewhat difficult - - - - -  Some recent data might be hidden    Review of Systems  Constitutional: Negative.   HENT: Negative.   Eyes: Negative.   Respiratory: Negative.   Cardiovascular: Negative.   Gastrointestinal: Negative.   Endocrine: Negative.   Genitourinary: Negative.   Musculoskeletal: Positive for gait problem.  Skin: Negative.   Allergic/Immunologic: Negative.   Hematological: Negative.   Psychiatric/Behavioral: Negative.   All other systems reviewed and are negative.      Objective:   Physical Exam General: No acute distress HEENT: EOMI, oral membranes moist Cards: reg rate  Chest: normal effort Abdomen: Soft, NT, ND Skin: dry, intact Extremities: no edema Neuro: Pt is cognitively appropriate with normal insight, memory, and awareness. Cranial nerves 2-12 are intact. Sensory exam is normal. Reflexes are 2+ in all 4's. Fine motor coordination is intact. No tremors.  Motor functions 5 out of 5 in the upper extremities he remains 4 out of 5 left lower extremity and 3 out of 5 right lower extremity grossly..  Musculoskeletal:  Left shoulder tender with abduction as well as external and internal rotation.  Impingement maneuver positive also.   Psych: Pt's affect is appropriate. Pt is cooperative        Assessment & Plan:  1. 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 -Continue  outpatient therapies at neuro rehab 2. History of chronic back pain/headaches/pain Management: Tylenol as needed for now -An MRI of the left shoulder was reordered today -continue prn flexeril             -Range of motion with therapy.  Consider repeat injection depending on MRI results. 3. Mood:Wellbutrin changed to XR form 4.HTN: Continue amlodipine and lisinopril--as well as hydralazine--hydralazine prescription was adjusted to reflect the appropriate 10 mg 3 times daily schedule.   5. Acute on chronic blood loss anemia/HH with gastritis: per outpt GI/primary 6. Chronic Insomnia: -Maintain Seroquel at 200 mg at bedtime             -melatonin OTC would also be helpful.  We discussed this             -no pm cigarettes although his sleeping habits seem to be a bit better             -Continue Wellbutrin in the morning 7.Tobacco dependence: -continueWellbutrin 100 mg twice daily   Fifteen minutes of face to face patient care time were spent during this visit. All questions were encouraged and answered.  Follow up with me in 3 months

## 2019-12-06 ENCOUNTER — Telehealth: Payer: Self-pay

## 2019-12-06 ENCOUNTER — Ambulatory Visit: Payer: Medicare HMO

## 2019-12-06 ENCOUNTER — Encounter: Payer: Self-pay | Admitting: Adult Health

## 2019-12-06 ENCOUNTER — Ambulatory Visit (INDEPENDENT_AMBULATORY_CARE_PROVIDER_SITE_OTHER): Payer: Medicare HMO | Admitting: Adult Health

## 2019-12-06 VITALS — BP 210/90

## 2019-12-06 DIAGNOSIS — I10 Essential (primary) hypertension: Secondary | ICD-10-CM

## 2019-12-06 DIAGNOSIS — R6889 Other general symptoms and signs: Secondary | ICD-10-CM | POA: Diagnosis not present

## 2019-12-06 MED ORDER — HYDRALAZINE HCL 10 MG PO TABS: 10.0000 mg | ORAL_TABLET | Freq: Three times a day (TID) | ORAL | 0 refills | Status: DC

## 2019-12-06 NOTE — Patient Instructions (Addendum)
I have sent in your prescription for hydralazine 10 mg three times a day. Take the other two medications daily.   Follow up with Dr.Banks on Monday and Dr. Naaman Plummer as directed

## 2019-12-06 NOTE — Progress Notes (Signed)
Subjective:    Patient ID: [name], male    DOB: February 04, 1951, 69 y.o.   MRN: [id number]  HPI 69 year old male who  has a past medical history of Arthritis, [name] ulcer, chronic (01/11/2018), Chronic back pain, GERD (gastroesophageal reflux disease), GIB (gastrointestinal bleeding) (2012), Glaucoma, Headache, Hepatitis C, Hiatal hernia with gastroesophageal reflux disease and esophagitis (01/05/2018), History of blood transfusion, History of gout, Hyperlipidemia, Hypertension, Insomnia, Ischemic colitis ([place]) (2012), Joint pain, Nocturia, Numbness, PONV (postoperative nausea and vomiting), Prostate cancer ([place]), Shortness of breath dyspnea, Stroke ([place]) (2016 ), Urinary frequency, and Urinary urgency.  He is a patient of Dr. Volanda Napoleon who I have never seen before.  He arrived at physical therapy today with extremely elevated blood pressure of 190/92 and 210/90.  He was seen by Dr. Eda Keys yesterday and was advised to start taking hydralazine 10 mg 3 times daily in addition to Norvasc and lisinopril.  He does report that he started this regimen today and has taken 2 tabs of his hydralazine already.  The new prescription was sent in yesterday but he reports that the pharmacy never received it.  I called the pharmacy and they can firmed this so I will send in a new prescription.  Patient is angry in the office today because he was not able to complete physical therapy and stated "you guys are messing around my blood pressure and I am going to have another stroke".    Review of Systems See HPI   Past Medical History:  Diagnosis Date  . Arthritis   . Cameron ulcer, chronic 01/11/2018  . Chronic back pain   . GERD (gastroesophageal reflux disease)    uses Baking Soda  . GIB (gastrointestinal bleeding) 2012  . Glaucoma    right eye  . Headache    occasionally  . Hepatitis C   . Hiatal hernia with gastroesophageal reflux disease and esophagitis 01/05/2018  . History of blood transfusion     no abnormal reaction noted  . History of gout    doesn't take any meds  . Hyperlipidemia    not on any meds  . Hypertension    takes Amlodipine and Lisinopril daily  . Insomnia    takes Ambien nightly  . Ischemic colitis ([place]) 2012  . Joint pain   . Nocturia   . Numbness    both legs occasionally  . PONV (postoperative nausea and vomiting)   . Prostate cancer ([place])   . Shortness of breath dyspnea    rarely but when notices he can be lying/sitting/exertion.Dr.Hochrein is aware per pt  . Stroke ([place]) 2016   . Urinary frequency   . Urinary urgency     Social History   Socioeconomic History  . Marital status: Legally Separated    Spouse name: Not on file  . Number of children: 2  . Years of education: Not on file  . Highest education level: Not on file  Occupational History  . Not on file  Tobacco Use  . Smoking status: Current Every Day Smoker    Packs/day: 0.50    Years: 50.00    Pack years: 25.00    Types: Cigarettes  . Smokeless tobacco: Never Used  Substance and Sexual Activity  . Alcohol use: Yes    Alcohol/week: 0.0 standard drinks    Comment: occasionally  . Drug use: No    Frequency: 5.0 times per week  . Sexual activity: Yes  Other Topics Concern  . Not on  file  Social History Narrative   One living child .  One murdered.  Lives with girlfriend.     Social Determinants of Health   Financial Resource Strain:   . Difficulty of Paying Living Expenses:   Food Insecurity:   . Worried About Charity fundraiser in the Last Year:   . Arboriculturist in the Last Year:   Transportation Needs:   . Film/video editor (Medical):   Marland Kitchen Lack of Transportation (Non-Medical):   Physical Activity:   . Days of Exercise per Week:   . Minutes of Exercise per Session:   Stress:   . Feeling of Stress :   Social Connections:   . Frequency of Communication with Friends and Family:   . Frequency of Social Gatherings with Friends and Family:   . Attends Religious  Services:   . Active Member of Clubs or Organizations:   . Attends Archivist Meetings:   Marland Kitchen Marital Status:   Intimate Partner Violence:   . Fear of Current or Ex-Partner:   . Emotionally Abused:   Marland Kitchen Physically Abused:   . Sexually Abused:     Past Surgical History:  Procedure Laterality Date  . APPENDECTOMY    . BIOPSY  01/11/2018   Procedure: BIOPSY;  Surgeon: Gatha Mayer, MD;  Location: [facility] ENDOSCOPY;  Service: Endoscopy;;  . COLONOSCOPY    . COLONOSCOPY N/A 10/26/2015   Procedure: COLONOSCOPY;  Surgeon: Doran Stabler, MD;  Location: [facility] ENDOSCOPY;  Service: Endoscopy;  Laterality: N/A;  . ESOPHAGOGASTRODUODENOSCOPY    . ESOPHAGOGASTRODUODENOSCOPY N/A 10/26/2015   Procedure: ESOPHAGOGASTRODUODENOSCOPY (EGD);  Surgeon: Doran Stabler, MD;  Location: [facility] ENDOSCOPY;  Service: Endoscopy;  Laterality: N/A;  . ESOPHAGOGASTRODUODENOSCOPY (EGD) WITH PROPOFOL N/A 01/11/2018   Procedure: ESOPHAGOGASTRODUODENOSCOPY (EGD) WITH PROPOFOL;  Surgeon: Gatha Mayer, MD;  Location: [facility] ENDOSCOPY;  Service: Endoscopy;  Laterality: N/A;  . INGUINAL HERNIA REPAIR Right 11/04/2014   Procedure: RIGHT INGUINAL HERNIA REPAIR WITH MESH;  Surgeon: Jackolyn Confer, MD;  Location: [place];  Service: General;  Laterality: Right;  . MASS EXCISION N/A 11/04/2014   Procedure: REMOVAL OF RIGHT GROIN SOFT TISSUE MASS;  Surgeon: Jackolyn Confer, MD;  Location: [place];  Service: General;  Laterality: N/A;  . Multiple abdominal surgeries     total of 13  . PROSTATECTOMY    . SMALL INTESTINE SURGERY      Family History  Problem Relation Age of Onset  . Alzheimer's disease Father   . Cancer Mother        Lymph node  . Heart failure Brother 45       Transplant 13 years ago  . CAD Brother   . Heart disease Sister 57       MI    Allergies  Allergen Reactions  . Penicillins Anaphylaxis    Has patient had a PCN reaction causing immediate rash, facial/tongue/throat swelling, SOB or lightheadedness with  hypotension: Yes Has patient had a PCN reaction causing severe rash involving mucus membranes or skin necrosis: No Has patient had a PCN reaction that required hospitalization: No Has patient had a PCN reaction occurring within the last 10 years: No If all of the above answers are "NO", then may proceed with Cephalosporin use..  . Sudafed [Pseudoephedrine] Other (See Comments)    Heart "races"    Current Outpatient Medications on File Prior to Visit  Medication Sig Dispense Refill  . amLODipine (NORVASC) 10 MG tablet Take  1 tablet (10 mg total) by mouth daily. 90 tablet 3  . lisinopril (ZESTRIL) 40 MG tablet Take 1 tablet (40 mg total) by mouth daily. 90 tablet 3  . acetaminophen (TYLENOL) 325 MG tablet Take 2 tablets (650 mg total) by mouth 4 (four) times daily -  with meals and at bedtime.    . ALPRAZolam (XANAX) 0.25 MG tablet Take 1 tablet (0.25 mg total) by mouth daily as needed for anxiety. 30 tablet 0  . buPROPion (WELLBUTRIN SR) 200 MG 12 hr tablet Take 1 tablet (200 mg total) by mouth daily. 30 tablet 2  . cyclobenzaprine (FLEXERIL) 5 MG tablet TAKE 1 TABLET BY MOUTH TWICE A DAY AS NEEDED FOR MUSCLE SPASMS 180 tablet 1  . ferrous sulfate 325 (65 FE) MG tablet TAKE 1 TABLET BY MOUTH THREE TWICE A DAY WITH MEALS (Patient taking differently: Take 325 mg by mouth 3 (three) times daily with meals. ) 90 tablet 3  . Incontinence Supply Disposable (PROCARE ADULT BRIEFS LARGE) MISC As needed 50 each 11  . lidocaine (LIDODERM) 5 % Place 1 patch onto the skin daily. Apply to left hip at 7 am and remove at 7 pm daily. 30 patch 0  . Menthol-Methyl Salicylate (MUSCLE RUB) 10-15 % CREA Apply 1 application topically 2 (two) times daily. To left shoulder and left hip  0  . pantoprazole (PROTONIX) 40 MG tablet TAKE 1 TABLET BY MOUTH TWICE A DAY BEFORE A MEAL 60 tablet 0  . QUEtiapine (SEROQUEL) 200 MG tablet Take 1 tablet (200 mg total) by mouth at bedtime. 30 tablet 2  . senna-docusate (SENOKOT-S)  8.6-50 MG tablet Take 1 tablet by mouth 2 (two) times daily. 30 tablet 0  . silver sulfADIAZINE (SILVADENE) 1 % cream Apply 1 application topically 2 (two) times daily. 50 g 0  . sodium chloride (OCEAN) 0.65 % SOLN nasal spray Place 1 spray into both nostrils as needed for congestion.  0  . traMADol (ULTRAM) 50 MG tablet Take 1 tablet (50 mg total) by mouth every 12 (twelve) hours as needed for severe pain (shouder pain). 60 tablet 0   No current facility-administered medications on file prior to visit.    BP (!) 160/92   Pulse 92   Temp 97.8 F (36.6 C)   Wt 196 lb (88.9 kg)   SpO2 99%   BMI 28.53 kg/m       Objective:   Physical Exam Vitals and nursing note reviewed.  Constitutional:      Appearance: Normal appearance.  Cardiovascular:     Rate and Rhythm: Normal rate and regular rhythm.     Pulses: Normal pulses.     Heart sounds: Normal heart sounds.  Pulmonary:     Effort: Pulmonary effort is normal.     Breath sounds: Normal breath sounds.  Neurological:     General: No focal deficit present.     Mental Status: He is alert and oriented to person, place, and time.     Gait: Gait abnormal.     Comments: Walks with rolling walker  Psychiatric:        Behavior: Behavior normal.       Assessment & Plan:  1. Essential hypertension -BP 160/90 on 2 separate readings today in the office.  Was advised that his blood pressure has come down since he was seen at physical therapy and this is likely due to the extra dose of hydralazine.  It is too early in the new treatment to  see how low his blood pressure is going to get it.  He was encouraged to stay on this current medication therapy.  I will send in a 90-day supply for him and he can follow-up with his primary care, which she has an appointment on Monday as well as Dr. Tessa Lerner for further blood pressure management  - hydrALAZINE (APRESOLINE) 10 MG tablet; Take 1 tablet (10 mg total) by mouth 3 (three) times daily.  Dispense:  270 tablet; Refill: 0   Dorothyann Peng, NP

## 2019-12-06 NOTE — Therapy (Signed)
[facility] [address] [place], [place], [address] Phone: [phone]   Fax:  [phone]  Physical Therapy Treatment  Patient Details  Name: [name] MRN: [id number] Date of Birth: 1951/01/03 Referring Provider (PT): [name]   Encounter Date: 12/06/2019  PT End of Session - 12/06/19 1037    Visit Number  5    Number of Visits  17    Date for PT Re-Evaluation  0000000   90 day cert but 60 day poc   Authorization Type  medicare payer so 10th visit note needed    PT Start Time  1016    PT Stop Time  1035   arrived no charge   PT Time Calculation (min)  19 min    Equipment Utilized During Treatment  Gait belt    Activity Tolerance  Patient tolerated treatment well    Behavior During Therapy  [facility] for tasks assessed/performed       Past Medical History:  Diagnosis Date  . Arthritis   . Cameron ulcer, chronic 01/11/2018  . Chronic back pain   . GERD (gastroesophageal reflux disease)    uses Baking Soda  . GIB (gastrointestinal bleeding) 2012  . Glaucoma    right eye  . Headache    occasionally  . Hepatitis C   . Hiatal hernia with gastroesophageal reflux disease and esophagitis 01/05/2018  . History of blood transfusion    no abnormal reaction noted  . History of gout    doesn't take any meds  . Hyperlipidemia    not on any meds  . Hypertension    takes Amlodipine and Lisinopril daily  . Insomnia    takes Ambien nightly  . Ischemic colitis ([place]) 2012  . Joint pain   . Nocturia   . Numbness    both legs occasionally  . PONV (postoperative nausea and vomiting)   . Prostate cancer ([place])   . Shortness of breath dyspnea    rarely but when notices he can be lying/sitting/exertion.Dr.Hochrein is aware per pt  . Stroke ([place]) 2016   . Urinary frequency   . Urinary urgency     Past Surgical History:  Procedure Laterality Date  . APPENDECTOMY    . BIOPSY  01/11/2018   Procedure: BIOPSY;   Surgeon: Gatha Mayer, MD;  Location: [facility] ENDOSCOPY;  Service: Endoscopy;;  . COLONOSCOPY    . COLONOSCOPY N/A 10/26/2015   Procedure: COLONOSCOPY;  Surgeon: Doran Stabler, MD;  Location: [facility] ENDOSCOPY;  Service: Endoscopy;  Laterality: N/A;  . ESOPHAGOGASTRODUODENOSCOPY    . ESOPHAGOGASTRODUODENOSCOPY N/A 10/26/2015   Procedure: ESOPHAGOGASTRODUODENOSCOPY (EGD);  Surgeon: Doran Stabler, MD;  Location: [facility] ENDOSCOPY;  Service: Endoscopy;  Laterality: N/A;  . ESOPHAGOGASTRODUODENOSCOPY (EGD) WITH PROPOFOL N/A 01/11/2018   Procedure: ESOPHAGOGASTRODUODENOSCOPY (EGD) WITH PROPOFOL;  Surgeon: Gatha Mayer, MD;  Location: [facility] ENDOSCOPY;  Service: Endoscopy;  Laterality: N/A;  . INGUINAL HERNIA REPAIR Right 11/04/2014   Procedure: RIGHT INGUINAL HERNIA REPAIR WITH MESH;  Surgeon: Jackolyn Confer, MD;  Location: [facility];  Service: General;  Laterality: Right;  . MASS EXCISION N/A 11/04/2014   Procedure: REMOVAL OF RIGHT GROIN SOFT TISSUE MASS;  Surgeon: Jackolyn Confer, MD;  Location: [facility];  Service: General;  Laterality: N/A;  . Multiple abdominal surgeries     total of 13  . PROSTATECTOMY    . SMALL INTESTINE SURGERY      Vitals:   12/06/19 1036 12/06/19 1037  BP: Marland Kitchen)  190/92 (!) 210/90                                 PT Short Term Goals - 11/15/19 1213      PT SHORT TERM GOAL #1   Title  Pt will be independent with initial HEP for strengthening and balance to continue gains on own. (target dates updated due to missed visits  to 12/06/19)    Time  4    Period  Weeks    Status  New    Target Date  12/06/19      PT SHORT TERM GOAL #2   Title  Pt will increase gait speed from 0.53m/s to >0.80m/s for improved household mobility.    Baseline  0.57m/s on 10/11/19    Time  4    Period  Weeks    Status  New    Target Date  12/06/19      PT SHORT TERM GOAL #3   Title  Pt will decrease 5 x sit to stand from 43 sec to <30 for improved balance and functional  strength.    Baseline  5 x sit to stand on 10/11/19=43 sec from mat    Time  4    Period  Weeks    Status  New    Target Date  12/13/19      PT SHORT TERM GOAL #4   Title  Berg balance will be performed for further assess balance and LTG written.    Baseline  Merrilee Jansky was performed on 11/13/19 with score of 32/56    Time  4    Period  Weeks    Status  Achieved    Target Date  12/13/19        PT Long Term Goals - 11/15/19 1215      PT LONG TERM GOAL #1   Title  Pt will be independent with progressive HEP for strength, balance and walking in order to continue gains at home.    Time  8    Period  Weeks    Status  New    Target Date  01/03/20   target dates updated due to missed visits     PT LONG TERM GOAL #2   Title  Pt will ambulate > 400' with LRAD mod I for improved household and short community distances.    Time  8    Period  Weeks    Status  New    Target Date  01/03/20      PT LONG TERM GOAL #3   Title  Pt will ambulate up/down curb and ramp supervision for improved community access.    Time  8    Period  Weeks    Status  New    Target Date  01/03/20      PT LONG TERM GOAL #4   Title  Pt will decreased 5 x sit to stand from 43 sec to < 20 sec for improved balance and functional strength.    Time  8    Period  Weeks    Status  New    Target Date  01/03/20      PT LONG TERM GOAL #5   Title  Pt will increase Berg Balance from 32/56 to >38/56 for improved balance and decreased fall risk.    Baseline  32/56    Time  8    Period  Weeks  Status  New    Target Date  01/03/20            Plan - 12/06/19 1046    Clinical Impression Statement  Pt on phone with MD office upon arrival stating that his BP is elevated. They offered him Monday appointment and he said he can not get transportation set up as needs to give 3 days notice. Pt was very angry and voicing frustrations to therapist that he has not been able to do anything in therapy and nobody is helping him.  PT explained risks with high BP and instructed pt that his safety was most important so we could not proceed further today due to extremely high BP. PT offered suggestions to call MD office back and try to schedule for later next week so he can get transportation set up. PT also let him know that she would once again reach out to notify MD. PT discussed plan to place patient on hold until after he sees MD and BP is better controlled to not waste his time coming in and not being able to participate.    Personal Factors and Comorbidities  Comorbidity 3+    Comorbidities  Right hemiparesis secondary to thalamic hemorrhage 07/20/18, chronic back pain/headaches/left shoulder pain, OA, HTN, anemia, insomniaPt    Examination-Activity Limitations  Bed Mobility;Locomotion Level;Stand;Transfers;Stairs    Examination-Participation Restrictions  Community Activity;Meal Prep    Stability/Clinical Decision Making  Stable/Uncomplicated    Rehab Potential  Good    PT Frequency  2x / week    PT Duration  8 weeks    PT Treatment/Interventions  ADLs/Self Care Home Management;Cryotherapy;Moist Heat;Electrical Stimulation;DME Instruction;Gait training;Stair training;Functional mobility training;Therapeutic activities;Therapeutic exercise;Orthotic Fit/Training;Patient/family education;Neuromuscular re-education;Balance training;Vestibular;Passive range of motion;Manual techniques    PT Next Visit Plan  Placing on hold until BP is better regulated.    PT Home Exercise Plan  Access Code: [email or web address]    Consulted and Agree with Plan of Care  Patient       Patient will benefit from skilled therapeutic intervention in order to improve the following deficits and impairments:  Abnormal gait, Decreased activity tolerance, Decreased balance, Decreased strength  Visit Diagnosis: Muscle weakness (generalized)     Problem List Patient Active Problem List   Diagnosis Date Noted  . Reactive depression 10/10/2019  . Acute on  chronic anemia   . Pain   . Agitation 07/26/2018  . Hepatitis C 07/26/2018  . Nontraumatic thalamic hemorrhage ([place]) 07/26/2018  . Osteoarthritis of left hip 04/06/2018  . Cameron ulcer, chronic 01/11/2018  . Symptomatic anemia 01/10/2018  . Syncope 01/10/2018  . Hiatal hernia with gastroesophageal reflux disease and esophagitis 01/05/2018  . Insomnia 01/05/2018  . Rotator cuff syndrome of left shoulder 11/21/2017  . Anemia, iron deficiency   . Gastrointestinal bleeding 10/24/2015  . Tobacco abuse   . Acute ischemic stroke ([place])   . Stroke ([place]) 05/31/2015  . HTN (hypertension) 04/10/2014  . Inguinal hernia without mention of obstruction or gangrene, unilateral or unspecified, (not specified as recurrent)-right 01/30/2014    Electa Sniff, PT, DPT, NCS 12/06/2019, 10:50 AM  [facility] [address] [place], [place], [address] Phone: [phone]   Fax:  [phone]  Name: [name] MRN: [id number] Date of Birth: 1951/02/05

## 2019-12-06 NOTE — Telephone Encounter (Signed)
  FYI Pt called the office with concerns of his BP readings being elevated 179/92, pt state that he is taking his blood pressure medications as prescribed, Denies any headches, dizziness, blurry vision or nausea. Pt is scheduled for office visit on  Monday with Dr Volanda Napoleon but was offered to come to the office today afternoon 12/06/2019 for his BP f/u with Dr Volanda Napoleon state that he has another dr appointment and that he has no transportation to get here. Checked  with Dr Volanda Napoleon if pt can have a telephone visit but stated that pt would need to be seen in the office or he will need to go to the ED,advised pt since he cannot make it to the office he will need to go to the ED for evaluation per Dr Volanda Napoleon recommendations, pt state that he will go since he does not want to have another stroke.

## 2019-12-06 NOTE — Telephone Encounter (Signed)
Dr. Volanda Napoleon, Mr. [name] arrived to therapy with extremely elevated BP again today. BP was 190/92 and 210/90. He was angry and voicing his frustrations about this and not being able to participate. I instructed him that I would need to put him on hold until he is seen by you and BP is able to be better regulated. I know he was on the phone with someone from your office when he arrived and they were trying to get him in. Thanks so much for your help. [name], PT, DPT, NCS

## 2019-12-10 ENCOUNTER — Ambulatory Visit (INDEPENDENT_AMBULATORY_CARE_PROVIDER_SITE_OTHER): Payer: Medicare HMO | Admitting: Family Medicine

## 2019-12-10 ENCOUNTER — Other Ambulatory Visit: Payer: Self-pay

## 2019-12-10 ENCOUNTER — Encounter: Payer: Self-pay | Admitting: Family Medicine

## 2019-12-10 VITALS — BP 158/86 | HR 82 | Temp 97.9°F | Wt 204.0 lb

## 2019-12-10 DIAGNOSIS — Z8673 Personal history of transient ischemic attack (TIA), and cerebral infarction without residual deficits: Secondary | ICD-10-CM

## 2019-12-10 DIAGNOSIS — R6889 Other general symptoms and signs: Secondary | ICD-10-CM | POA: Diagnosis not present

## 2019-12-10 DIAGNOSIS — I10 Essential (primary) hypertension: Secondary | ICD-10-CM | POA: Diagnosis not present

## 2019-12-10 DIAGNOSIS — B372 Candidiasis of skin and nail: Secondary | ICD-10-CM | POA: Diagnosis not present

## 2019-12-10 MED ORDER — NYSTATIN 100000 UNIT/GM EX CREA: 1.0000 "application " | TOPICAL_CREAM | Freq: Two times a day (BID) | CUTANEOUS | 0 refills | Status: DC

## 2019-12-10 MED ORDER — HYDRALAZINE HCL 25 MG PO TABS: 25.0000 mg | ORAL_TABLET | Freq: Three times a day (TID) | ORAL | 2 refills | Status: DC

## 2019-12-10 NOTE — Progress Notes (Signed)
Subjective:    Patient ID: [name], male    DOB: Jan 29, 1951, 69 y.o.   MRN: [id number]  No chief complaint on file.   HPI Pt is a 69 yo male with pmh sig for h/o CVA, HTN, hiatal hernia, GERD, h/o GIB, hep C, OA L hip, h/o anemia, HLD, insomnia, nicotine use, depression, was seen today for f/u on HTN after visit on 5/20.   Pt states bp at home 198/98 or 88 this am.  Pt notes unable to complete PT 2/2 elevated bp.  Pt denies HAs, CP, dizziness.  Pt states he is taking his meds.  Did not bring with him this visit.  Pt states he is cooking pork, beef, and chicken at home.  Pt states he did not get the form for the city to move his garbage cans from the house to the street back.  Pt complains of rash on bottom, at times painful.  Past Medical History:  Diagnosis Date  . Arthritis   . Cameron ulcer, chronic 01/11/2018  . Chronic back pain   . GERD (gastroesophageal reflux disease)    uses Baking Soda  . GIB (gastrointestinal bleeding) 2012  . Glaucoma    right eye  . Headache    occasionally  . Hepatitis C   . Hiatal hernia with gastroesophageal reflux disease and esophagitis 01/05/2018  . History of blood transfusion    no abnormal reaction noted  . History of gout    doesn't take any meds  . Hyperlipidemia    not on any meds  . Hypertension    takes Amlodipine and Lisinopril daily  . Insomnia    takes Ambien nightly  . Ischemic colitis ([place]) 2012  . Joint pain   . Nocturia   . Numbness    both legs occasionally  . PONV (postoperative nausea and vomiting)   . Prostate cancer ([place])   . Shortness of breath dyspnea    rarely but when notices he can be lying/sitting/exertion.Dr.Hochrein is aware per pt  . Stroke ([place]) 2016   . Urinary frequency   . Urinary urgency     Allergies  Allergen Reactions  . Penicillins Anaphylaxis    Has patient had a PCN reaction causing immediate rash, facial/tongue/throat swelling, SOB or lightheadedness with hypotension: Yes Has patient  had a PCN reaction causing severe rash involving mucus membranes or skin necrosis: No Has patient had a PCN reaction that required hospitalization: No Has patient had a PCN reaction occurring within the last 10 years: No If all of the above answers are "NO", then may proceed with Cephalosporin use..  . Sudafed [Pseudoephedrine] Other (See Comments)    Heart "races"    ROS General: Denies fever, chills, night sweats, changes in weight, changes in appetite HEENT: Denies headaches, ear pain, changes in vision, rhinorrhea, sore throat CV: Denies CP, palpitations, SOB, orthopnea Pulm: Denies SOB, cough, wheezing GI: Denies abdominal pain, nausea, vomiting, diarrhea, constipation GU: Denies dysuria, hematuria, frequency, vaginal discharge Msk: Denies muscle cramps, joint pains Neuro: Denies weakness, numbness, tingling Skin: Denies rashes, bruising Psych: Denies depression, anxiety, hallucinations     Objective:    Blood pressure (!) 158/86, pulse 82, temperature 97.9 F (36.6 C), temperature source Temporal, weight 204 lb (92.5 kg), SpO2 98 %.  Gen. Pleasant, well-nourished, in no distress, normal affect   HEENT: [place]/AT, face symmetric, no scleral icterus, PERRLA, EOMI, nares patent without drainage Lungs: no accessory muscle use, CTAB, no wheezes or rales Cardiovascular: RRR, 2/6  murmur best heard in RUSB and LUSB, no peripheral edema Musculoskeletal: No deformities, no cyanosis or clubbing, normal tone Neuro:  A&Ox3, CN II-XII intact, ambulating with walker Skin:  Warm, no lesions/ rash.  Gluteal cleft with small fissure in skin and satellite lesions  Wt Readings from Last 3 Encounters:  12/06/19 196 lb (88.9 kg)  12/05/19 210 lb (95.3 kg)  09/20/19 210 lb (95.3 kg)    Lab Results  Component Value Date   WBC 4.3 09/20/2019   HGB 7.2 (L) 09/20/2019   HCT 29.1 (L) 09/20/2019   PLT 403 (H) 09/20/2019   GLUCOSE 96 09/20/2019   CHOL 102 07/22/2018   TRIG 108 07/22/2018   HDL  35 (L) 07/22/2018   LDLCALC 45 07/22/2018   ALT 15 09/20/2019   AST 16 09/20/2019   NA 141 09/20/2019   K 4.4 09/20/2019   CL 110 09/20/2019   CREATININE 0.95 09/20/2019   BUN 10 09/20/2019   CO2 21 (L) 09/20/2019   TSH 0.70 09/19/2019   INR 1.23 07/20/2018   HGBA1C 4.7 (L) 07/22/2018    Assessment/Plan:  History of CVA (cerebrovascular accident)  -concern h/o CVA affecting pt's ability to reason. -pt benefits from in person visits as opposed to telephone visits. -Pt advised to set up VM and check VM in regards to appts -pt advised form regarding garbage was completed months ago and returned per request. -ambulating with walker this visit. -continue f/u with PM&R and PT - Plan: Ambulatory referral to [place] hypertension  -elevated but improving -will increased Hydralazine to 25 mg TID from 10 mg TID -continue lisinopril 40 mg, norvasc 10 mg -concerned diet also contributing to pt's bp.  Discussed lifestyle modifications such as limiting processed foods. - Plan: Ambulatory referral to [place], hydrALAZINE (APRESOLINE) 25 MG tablet  Candidal dermatitis  - Plan: nystatin cream (MYCOSTATIN)  F/u in person in 1 month  [name], MD

## 2019-12-10 NOTE — Patient Instructions (Addendum)
Today we increased her hydralazine from 10 mg to 25 mg 3 times a day.  This means you are no longer taking hydralazine 10 mg.  You should continue taking Norvasc 10 mg and lisinopril 40 mg for your blood pressure.  It is important that you bring your blood pressure cuff and medications to each visit.  We will have him follow-up in 1 month in the office for your blood pressure.  Managing Your Hypertension Hypertension is commonly called high blood pressure. This is when the force of your blood pressing against the walls of your arteries is too strong. Arteries are blood vessels that carry blood from your heart throughout your body. Hypertension forces the heart to work harder to pump blood, and may cause the arteries to become narrow or stiff. Having untreated or uncontrolled hypertension can cause heart attack, stroke, kidney disease, and other problems. What are blood pressure readings? A blood pressure reading consists of a higher number over a lower number. Ideally, your blood pressure should be below 120/80. The first ("top") number is called the systolic pressure. It is a measure of the pressure in your arteries as your heart beats. The second ("bottom") number is called the diastolic pressure. It is a measure of the pressure in your arteries as the heart relaxes. What does my blood pressure reading mean? Blood pressure is classified into four stages. Based on your blood pressure reading, your health care provider may use the following stages to determine what type of treatment you need, if any. Systolic pressure and diastolic pressure are measured in a unit called mm Hg. Normal  Systolic pressure: below 123456.  Diastolic pressure: below 80. Elevated  Systolic pressure: [id number].  Diastolic pressure: below 80. Hypertension stage 1  Systolic pressure: 0000000.  Diastolic pressure: [id number]. Hypertension stage 2  Systolic pressure: [id number] or above.  Diastolic pressure: 90 or above. What health  risks are associated with hypertension? Managing your hypertension is an important responsibility. Uncontrolled hypertension can lead to:  A heart attack.  A stroke.  A weakened blood vessel (aneurysm).  Heart failure.  Kidney damage.  Eye damage.  Metabolic syndrome.  Memory and concentration problems. What changes can I make to manage my hypertension? Hypertension can be managed by making lifestyle changes and possibly by taking medicines. Your health care provider will help you make a plan to bring your blood pressure within a normal range. Eating and drinking   Eat a diet that is high in fiber and potassium, and low in salt (sodium), added sugar, and fat. An example eating plan is called the DASH (Dietary Approaches to Stop Hypertension) diet. To eat this way: ? Eat plenty of fresh fruits and vegetables. Try to fill half of your plate at each meal with fruits and vegetables. ? Eat whole grains, such as whole wheat pasta, brown rice, or whole grain bread. Fill about one quarter of your plate with whole grains. ? Eat low-fat diary products. ? Avoid fatty cuts of meat, processed or cured meats, and poultry with skin. Fill about one quarter of your plate with lean proteins such as fish, chicken without skin, beans, eggs, and tofu. ? Avoid premade and processed foods. These tend to be higher in sodium, added sugar, and fat.  Reduce your daily sodium intake. Most people with hypertension should eat less than 1,500 mg of sodium a day.  Limit alcohol intake to no more than 1 drink a day for nonpregnant women and 2 drinks a day for men.  One drink equals 12 oz of beer, 5 oz of wine, or 1 oz of hard liquor. Lifestyle  Work with your health care provider to maintain a healthy body weight, or to lose weight. Ask what an ideal weight is for you.  Get at least 30 minutes of exercise that causes your heart to beat faster (aerobic exercise) most days of the week. Activities may include  walking, swimming, or biking.  Include exercise to strengthen your muscles (resistance exercise), such as weight lifting, as part of your weekly exercise routine. Try to do these types of exercises for 30 minutes at least 3 days a week.  Do not use any products that contain nicotine or tobacco, such as cigarettes and e-cigarettes. If you need help quitting, ask your health care provider.  Control any long-term (chronic) conditions you have, such as high cholesterol or diabetes. Monitoring  Monitor your blood pressure at home as told by your health care provider. Your personal target blood pressure may vary depending on your medical conditions, your age, and other factors.  Have your blood pressure checked regularly, as often as told by your health care provider. Working with your health care provider  Review all the medicines you take with your health care provider because there may be side effects or interactions.  Talk with your health care provider about your diet, exercise habits, and other lifestyle factors that may be contributing to hypertension.  Visit your health care provider regularly. Your health care provider can help you create and adjust your plan for managing hypertension. Will I need medicine to control my blood pressure? Your health care provider may prescribe medicine if lifestyle changes are not enough to get your blood pressure under control, and if:  Your systolic blood pressure is 130 or higher.  Your diastolic blood pressure is 80 or higher. Take medicines only as told by your health care provider. Follow the directions carefully. Blood pressure medicines must be taken as prescribed. The medicine does not work as well when you skip doses. Skipping doses also puts you at risk for problems. Contact a health care provider if:  You think you are having a reaction to medicines you have taken.  You have repeated (recurrent) headaches.  You feel dizzy.  You have  swelling in your ankles.  You have trouble with your vision. Get help right away if:  You develop a severe headache or confusion.  You have unusual weakness or numbness, or you feel faint.  You have severe pain in your chest or abdomen.  You vomit repeatedly.  You have trouble breathing. Summary  Hypertension is when the force of blood pumping through your arteries is too strong. If this condition is not controlled, it may put you at risk for serious complications.  Your personal target blood pressure may vary depending on your medical conditions, your age, and other factors. For most people, a normal blood pressure is less than 120/80.  Hypertension is managed by lifestyle changes, medicines, or both. Lifestyle changes include weight loss, eating a healthy, low-sodium diet, exercising more, and limiting alcohol. This information is not intended to replace advice given to you by your health care provider. Make sure you discuss any questions you have with your health care provider. Document Revised: 10/27/2018 Document Reviewed: 06/02/2016 Elsevier Patient Education  [employer].

## 2019-12-11 ENCOUNTER — Telehealth: Payer: Self-pay

## 2019-12-11 ENCOUNTER — Ambulatory Visit: Payer: Medicare Other

## 2019-12-11 ENCOUNTER — Telehealth: Payer: Self-pay | Admitting: Family Medicine

## 2019-12-11 NOTE — Telephone Encounter (Signed)
Patient called [phone] called and needs return call at 1:58 pm today

## 2019-12-11 NOTE — Telephone Encounter (Signed)
Medication Refill:  Hydralazine 25 mg Lisinopril 40 mg Seroquel Amlodipine 10 mg Bupropion 200 MG Cyclobenzaprine 5 mg  Pharmacy: [facility] Order [phone]

## 2019-12-12 ENCOUNTER — Telehealth: Payer: Self-pay

## 2019-12-12 NOTE — Telephone Encounter (Signed)
Spoke with pt advised that he has enough refills at his local pharmacy, pt state that he is now with [facility] and requests for further refills to go to [facility]. Advised pt to call the office when he gets low so we can sent refills to [facility]

## 2019-12-12 NOTE — Telephone Encounter (Signed)
[name] is calling from [facility] stating that they have not received the request back for the following Amlodipine 10 MG hydralazine 10 MG bupropion 200 MG linsinopril 40 MG Quetia;ine 200 MG and would like to see if someone could give them a call back.  [name] is aware that someone from the pharmacy has called about the medications. She would like to see if someone could give her a call back

## 2019-12-12 NOTE — Telephone Encounter (Signed)
Spoke with [place] state that pt wanted to start getting his medications on mail order since he will not be required to pay a copay, advise [employer] that pt state to hold off sending the Rx since he still has enough refills in his local pharmacy. Advised [employer] that I will call pt to confirm if he still needs to use the mail order pharmacy for his Rx

## 2019-12-12 NOTE — Telephone Encounter (Signed)
Called and left message for patient to call back.

## 2019-12-13 ENCOUNTER — Ambulatory Visit: Payer: Medicare Other

## 2019-12-14 NOTE — Telephone Encounter (Signed)
Patient seen in the office. 

## 2019-12-15 ENCOUNTER — Encounter: Payer: Self-pay | Admitting: Family Medicine

## 2019-12-24 DIAGNOSIS — G8929 Other chronic pain: Secondary | ICD-10-CM | POA: Diagnosis not present

## 2019-12-24 DIAGNOSIS — M1990 Unspecified osteoarthritis, unspecified site: Secondary | ICD-10-CM | POA: Diagnosis not present

## 2019-12-24 DIAGNOSIS — H409 Unspecified glaucoma: Secondary | ICD-10-CM | POA: Diagnosis not present

## 2019-12-24 DIAGNOSIS — Z8673 Personal history of transient ischemic attack (TIA), and cerebral infarction without residual deficits: Secondary | ICD-10-CM | POA: Diagnosis not present

## 2019-12-24 DIAGNOSIS — I10 Essential (primary) hypertension: Secondary | ICD-10-CM | POA: Diagnosis not present

## 2019-12-24 DIAGNOSIS — E782 Mixed hyperlipidemia: Secondary | ICD-10-CM | POA: Diagnosis not present

## 2019-12-24 DIAGNOSIS — K219 Gastro-esophageal reflux disease without esophagitis: Secondary | ICD-10-CM | POA: Diagnosis not present

## 2019-12-25 ENCOUNTER — Encounter: Payer: Self-pay | Admitting: Family Medicine

## 2019-12-25 ENCOUNTER — Telehealth (INDEPENDENT_AMBULATORY_CARE_PROVIDER_SITE_OTHER): Payer: Medicare HMO | Admitting: Family Medicine

## 2019-12-25 DIAGNOSIS — I10 Essential (primary) hypertension: Secondary | ICD-10-CM

## 2019-12-25 NOTE — Progress Notes (Signed)
Virtual Visit via Telephone Note  I connected with [name] on 12/27/19 at  3:00 PM EDT by telephone and verified that I am speaking with the correct person using two identifiers.   I discussed the limitations, risks, security and privacy concerns of performing an evaluation and management service by telephone and the availability of in person appointments. I also discussed with the patient that there may be a patient responsible charge related to this service. The patient expressed understanding and agreed to proceed.  Location patient: home, [place] Location provider: work or home office Participants present for the call: patient, provider Patient did not have a visit in the prior 7 days to address this/these issue(s).   History of Present Illness:   Acute visit for Hypertension: -reports " I have had high blood pressure my whole life" -BP now is 187/78 -reports blood pressure has been running high the last few weeks in the 180/70s range -reports he has a history of strokes so was concerned about this -reports chronic LE edema but otherwise denies any symptoms with this -denies CP, SOB, HA, vision changes, palpitations -reports saw PCP recently for this and hydralazine dose was increased to 25mg  instead of 10 mg and he is taking it tid -reports he also is taking the lisinopril 40mg  and the norvasc 10 mg every day -denies missing doses -does admit to some alcohol use and reports he smokes, also does use liberal salt in diet    Observations/Objective: Patient sounds cheerful and well on the phone. I do not appreciate any SOB. Speech and thought processing are grossly intact. Patient reported vitals:  Assessment and Plan:  Essential hypertension  -we discussed possible serious and likely etiologies, options for evaluation and workup, limitations of telemedicine visit vs in person visit, treatment, treatment risks and precautions. Pt prefers to treat via telemedicine  empirically rather then risking or undertaking an in person visit at this moment. Discussed BP goals, causes of elevated BP and treatment options. It appears his BP was much lower on office check recently than on home reported checks. We opted to start with continuation of current medications along with lifestyle changes and a close inperson follow up to ensure his cuff is reading accurately. If BP is indeed elevated discussed consideration of a diuretic if PCP felt this was a good choice and/or referral to nephrology for evaluation of resistant hypertension. Sent message to schedulers to assist with scheduling inperson visit with PCP. Advised pt to bring cuff, medications and BP log to the appointment. Patient agrees to seek prompt in person care if worsening, new symptoms arise, or if is not improving with treatment.  Follow Up Instructions:   I did not refer this patient for an OV in the next 24 hours for this/these issue(s).  I discussed the assessment and treatment plan with the patient. The patient was provided an opportunity to ask questions and all were answered. The patient agreed with the plan and demonstrated an understanding of the instructions.   The patient was advised to call back or seek an in-person evaluation if the symptoms worsen or if the condition fails to improve as anticipated.  I provided 18 minutes of non-face-to-face time during this encounter.   Lucretia Kern, DO

## 2019-12-26 ENCOUNTER — Telehealth: Payer: Self-pay | Admitting: Family Medicine

## 2019-12-26 NOTE — Telephone Encounter (Signed)
Sarah with [facility], is requesting a verbal order and a medical social worker evaluation. Judson Roch, can be reached at [phone]. Thanks

## 2019-12-27 ENCOUNTER — Other Ambulatory Visit: Payer: Self-pay

## 2019-12-27 DIAGNOSIS — I10 Essential (primary) hypertension: Secondary | ICD-10-CM | POA: Diagnosis not present

## 2019-12-27 DIAGNOSIS — E782 Mixed hyperlipidemia: Secondary | ICD-10-CM | POA: Diagnosis not present

## 2019-12-27 DIAGNOSIS — M1990 Unspecified osteoarthritis, unspecified site: Secondary | ICD-10-CM | POA: Diagnosis not present

## 2019-12-27 DIAGNOSIS — G8929 Other chronic pain: Secondary | ICD-10-CM | POA: Diagnosis not present

## 2019-12-27 DIAGNOSIS — Z8673 Personal history of transient ischemic attack (TIA), and cerebral infarction without residual deficits: Secondary | ICD-10-CM | POA: Diagnosis not present

## 2019-12-27 DIAGNOSIS — H409 Unspecified glaucoma: Secondary | ICD-10-CM | POA: Diagnosis not present

## 2019-12-27 DIAGNOSIS — K219 Gastro-esophageal reflux disease without esophagitis: Secondary | ICD-10-CM | POA: Diagnosis not present

## 2019-12-27 NOTE — Telephone Encounter (Signed)
Ok for verbal order  °

## 2019-12-28 ENCOUNTER — Telehealth: Payer: Self-pay | Admitting: Family Medicine

## 2019-12-28 ENCOUNTER — Ambulatory Visit (INDEPENDENT_AMBULATORY_CARE_PROVIDER_SITE_OTHER): Payer: Medicare HMO | Admitting: Family Medicine

## 2019-12-28 ENCOUNTER — Encounter: Payer: Self-pay | Admitting: Family Medicine

## 2019-12-28 VITALS — BP 174/76 | HR 88 | Temp 97.8°F | Wt 192.0 lb

## 2019-12-28 DIAGNOSIS — I10 Essential (primary) hypertension: Secondary | ICD-10-CM | POA: Diagnosis not present

## 2019-12-28 DIAGNOSIS — Z8673 Personal history of transient ischemic attack (TIA), and cerebral infarction without residual deficits: Secondary | ICD-10-CM | POA: Diagnosis not present

## 2019-12-28 DIAGNOSIS — F5101 Primary insomnia: Secondary | ICD-10-CM

## 2019-12-28 MED ORDER — LISINOPRIL 40 MG PO TABS: 40.0000 mg | ORAL_TABLET | Freq: Every day | ORAL | 3 refills | Status: DC

## 2019-12-28 MED ORDER — BLOOD PRESSURE MONITOR/L CUFF MISC: 0 refills | Status: DC

## 2019-12-28 MED ORDER — AMLODIPINE BESYLATE 10 MG PO TABS: 10.0000 mg | ORAL_TABLET | Freq: Every day | ORAL | 3 refills | Status: DC

## 2019-12-28 NOTE — Telephone Encounter (Signed)
[name] spoke to pt.

## 2019-12-28 NOTE — Telephone Encounter (Signed)
Left a detailed message on [name]'s voicemail with the order as below.

## 2019-12-28 NOTE — Progress Notes (Signed)
Subjective:    Patient ID: [name], male    DOB: July 10, 1951, 69 y.o.   MRN: [id number]  No chief complaint on file.   HPI Patient was seen today for f/u.  Pt states he needs a refill on one of his bp meds.  Has bottles of lisinopril and norvasc with him and his bp cuff.  Pt states HH came out yesterday and helped him organize his meds.  Pt states he stopped eating hot dogs, bacon, and hamburgers.  Also cutting down on salt.  Pt drinking several sodas per day.  Pt wants to be able to go back to PT.  Pt also inquires about some one to help him cook/clean up around the house.  Past Medical History:  Diagnosis Date  . Arthritis   . [place] ulcer, chronic 01/11/2018  . Chronic back pain   . GERD (gastroesophageal reflux disease)    uses Baking Soda  . GIB (gastrointestinal bleeding) 2012  . Glaucoma    right eye  . Headache    occasionally  . Hepatitis C   . Hiatal hernia with gastroesophageal reflux disease and esophagitis 01/05/2018  . History of blood transfusion    no abnormal reaction noted  . History of gout    doesn't take any meds  . Hyperlipidemia    not on any meds  . Hypertension    takes Amlodipine and Lisinopril daily  . Insomnia    takes Ambien nightly  . Ischemic colitis ([place] [facility]) 2012  . Joint pain   . Nocturia   . Numbness    both legs occasionally  . PONV (postoperative nausea and vomiting)   . Prostate cancer ([place])   . Shortness of breath dyspnea    rarely but when notices he can be lying/sitting/exertion.Dr.Hochrein is aware per pt  . Stroke ([place]) 2016   . Urinary frequency   . Urinary urgency     Allergies  Allergen Reactions  . Penicillins Anaphylaxis    Has patient had a PCN reaction causing immediate rash, facial/tongue/throat swelling, SOB or lightheadedness with hypotension: Yes Has patient had a PCN reaction causing severe rash involving mucus membranes or skin necrosis: No Has patient had a PCN reaction that required hospitalization:  No Has patient had a PCN reaction occurring within the last 10 years: No If all of the above answers are "NO", then may proceed with Cephalosporin use..  . Sudafed [Pseudoephedrine] Other (See Comments)    Heart "races"    ROS General: Denies fever, chills, night sweats, changes in weight, changes in appetite  +insomnia HEENT: Denies headaches, ear pain, changes in vision, rhinorrhea, sore throat  +anosmia CV: Denies CP, palpitations, SOB, orthopnea Pulm: Denies SOB, cough, wheezing GI: Denies abdominal pain, nausea, vomiting, diarrhea, constipation GU: Denies dysuria, hematuria, frequency Msk: Denies muscle cramps, joint pains Neuro: Denies weakness, numbness, tingling Skin: Denies rashes, bruising Psych: Denies depression, anxiety, hallucinations  Objective:    Blood pressure (!) 148/77, pulse 94, temperature 97.8 F (36.6 C), temperature source Temporal, weight 192 lb (87.1 kg), SpO2 97 %.  Gen. Pleasant, well-nourished, in no distress, normal affect  HEENT: [place]/AT, face symmetric, no scleral icterus, PERRLA, EOMI, nares patent without drainage Lungs: no accessory muscle use Cardiovascular: RRR, no m/r/g, no peripheral edema Abdomen: BS present, soft, NT/ND, no hepatosplenomegaly. Musculoskeletal: No deformities, no cyanosis or clubbing, normal tone Neuro:  A&Ox3, CN II-XII intact,in a motorized wheel chair Skin:  Warm, no lesions/ rash   Wt Readings from Last 3  Encounters:  12/28/19 192 lb (87.1 kg)  12/10/19 204 lb (92.5 kg)  12/06/19 196 lb (88.9 kg)    Lab Results  Component Value Date   WBC 4.3 09/20/2019   HGB 7.2 (L) 09/20/2019   HCT 29.1 (L) 09/20/2019   PLT 403 (H) 09/20/2019   GLUCOSE 96 09/20/2019   CHOL 102 07/22/2018   TRIG 108 07/22/2018   HDL 35 (L) 07/22/2018   LDLCALC 45 07/22/2018   ALT 15 09/20/2019   AST 16 09/20/2019   NA 141 09/20/2019   K 4.4 09/20/2019   CL 110 09/20/2019   CREATININE 0.95 09/20/2019   BUN 10 09/20/2019   CO2 21 (L)  09/20/2019   TSH 0.70 09/19/2019   INR 1.23 07/20/2018   HGBA1C 4.7 (L) 07/22/2018    Assessment/Plan:  Essential hypertension  -improving -Pt's home cuff readings higher than in office readings.  174/78 with personal cuff.  Manual reading 148/77 -pt advised to get a new bp cuff. -continue lifestyle modifications.  Pt to decrease intake of sodium and caffeine. - Plan: amLODipine (NORVASC) 10 MG tablet, lisinopril (ZESTRIL) 40 MG tablet, Blood Pressure Monitoring (BLOOD PRESSURE MONITOR/L CUFF) MISC  History of CVA (cerebrovascular accident) -pt can return to PT  -Pt given information about area food [employer]/Meals on Wheels. -Will try to find forms and other programs for pt  Insomnia -pt to decreased caffeine intake -discussed sleep hygiene -continue current meds  F/u prn  [name], MD

## 2019-12-28 NOTE — Telephone Encounter (Signed)
Ok

## 2019-12-28 NOTE — Patient Instructions (Signed)
   Managing Your Hypertension Hypertension is commonly called high blood pressure. This is when the force of your blood pressing against the walls of your arteries is too strong. Arteries are blood vessels that carry blood from your heart throughout your body. Hypertension forces the heart to work harder to pump blood, and may cause the arteries to become narrow or stiff. Having untreated or uncontrolled hypertension can cause heart attack, stroke, kidney disease, and other problems. What are blood pressure readings? A blood pressure reading consists of a higher number over a lower number. Ideally, your blood pressure should be below 120/80. The first ("top") number is called the systolic pressure. It is a measure of the pressure in your arteries as your heart beats. The second ("bottom") number is called the diastolic pressure. It is a measure of the pressure in your arteries as the heart relaxes. What does my blood pressure reading mean? Blood pressure is classified into four stages. Based on your blood pressure reading, your health care provider may use the following stages to determine what type of treatment you need, if any. Systolic pressure and diastolic pressure are measured in a unit called mm Hg. Normal  Systolic pressure: below 120.  Diastolic pressure: below 80. Elevated  Systolic pressure: 120-129.  Diastolic pressure: below 80. Hypertension stage 1  Systolic pressure: 130-139.  Diastolic pressure: 80-89. Hypertension stage 2  Systolic pressure: 140 or above.  Diastolic pressure: 90 or above. What health risks are associated with hypertension? Managing your hypertension is an important responsibility. Uncontrolled hypertension can lead to:  A heart attack.  A stroke.  A weakened blood vessel (aneurysm).  Heart failure.  Kidney damage.  Eye damage.  Metabolic syndrome.  Memory and concentration problems. What changes can I make to manage my  hypertension? Hypertension can be managed by making lifestyle changes and possibly by taking medicines. Your health care provider will help you make a plan to bring your blood pressure within a normal range. Eating and drinking   Eat a diet that is high in fiber and potassium, and low in salt (sodium), added sugar, and fat. An example eating plan is called the DASH (Dietary Approaches to Stop Hypertension) diet. To eat this way: ? Eat plenty of fresh fruits and vegetables. Try to fill half of your plate at each meal with fruits and vegetables. ? Eat whole grains, such as whole wheat pasta, brown rice, or whole grain bread. Fill about one quarter of your plate with whole grains. ? Eat low-fat diary products. ? Avoid fatty cuts of meat, processed or cured meats, and poultry with skin. Fill about one quarter of your plate with lean proteins such as fish, chicken without skin, beans, eggs, and tofu. ? Avoid premade and processed foods. These tend to be higher in sodium, added sugar, and fat.  Reduce your daily sodium intake. Most people with hypertension should eat less than 1,500 mg of sodium a day.  Limit alcohol intake to no more than 1 drink a day for nonpregnant women and 2 drinks a day for men. One drink equals 12 oz of beer, 5 oz of wine, or 1 oz of hard liquor. Lifestyle  Work with your health care provider to maintain a healthy body weight, or to lose weight. Ask what an ideal weight is for you.  Get at least 30 minutes of exercise that causes your heart to beat faster (aerobic exercise) most days of the week. Activities may include walking, swimming, or biking.    Include exercise to strengthen your muscles (resistance exercise), such as weight lifting, as part of your weekly exercise routine. Try to do these types of exercises for 30 minutes at least 3 days a week.  Do not use any products that contain nicotine or tobacco, such as cigarettes and e-cigarettes. If you need help quitting,  ask your health care provider.  Control any long-term (chronic) conditions you have, such as high cholesterol or diabetes. Monitoring  Monitor your blood pressure at home as told by your health care provider. Your personal target blood pressure may vary depending on your medical conditions, your age, and other factors.  Have your blood pressure checked regularly, as often as told by your health care provider. Working with your health care provider  Review all the medicines you take with your health care provider because there may be side effects or interactions.  Talk with your health care provider about your diet, exercise habits, and other lifestyle factors that may be contributing to hypertension.  Visit your health care provider regularly. Your health care provider can help you create and adjust your plan for managing hypertension. Will I need medicine to control my blood pressure? Your health care provider may prescribe medicine if lifestyle changes are not enough to get your blood pressure under control, and if:  Your systolic blood pressure is 130 or higher.  Your diastolic blood pressure is 80 or higher. Take medicines only as told by your health care provider. Follow the directions carefully. Blood pressure medicines must be taken as prescribed. The medicine does not work as well when you skip doses. Skipping doses also puts you at risk for problems. Contact a health care provider if:  You think you are having a reaction to medicines you have taken.  You have repeated (recurrent) headaches.  You feel dizzy.  You have swelling in your ankles.  You have trouble with your vision. Get help right away if:  You develop a severe headache or confusion.  You have unusual weakness or numbness, or you feel faint.  You have severe pain in your chest or abdomen.  You vomit repeatedly.  You have trouble breathing. Summary  Hypertension is when the force of blood pumping  through your arteries is too strong. If this condition is not controlled, it may put you at risk for serious complications.  Your personal target blood pressure may vary depending on your medical conditions, your age, and other factors. For most people, a normal blood pressure is less than 120/80.  Hypertension is managed by lifestyle changes, medicines, or both. Lifestyle changes include weight loss, eating a healthy, low-sodium diet, exercising more, and limiting alcohol. This information is not intended to replace advice given to you by your health care provider. Make sure you discuss any questions you have with your health care provider. Document Revised: 10/27/2018 Document Reviewed: 06/02/2016 Elsevier Patient Education  2020 [employer]  

## 2019-12-31 ENCOUNTER — Encounter: Payer: Self-pay | Admitting: Family Medicine

## 2020-01-01 ENCOUNTER — Telehealth: Payer: Self-pay | Admitting: Family Medicine

## 2020-01-01 ENCOUNTER — Emergency Department (HOSPITAL_COMMUNITY)
Admission: EM | Admit: 2020-01-01 | Discharge: 2020-01-01 | Discharge status: Against Medical Advice | Payer: Medicare HMO | Attending: Emergency Medicine | Admitting: Emergency Medicine

## 2020-01-01 ENCOUNTER — Encounter (HOSPITAL_COMMUNITY): Payer: Self-pay | Admitting: Emergency Medicine

## 2020-01-01 DIAGNOSIS — Z5321 Procedure and treatment not carried out due to patient leaving prior to being seen by health care provider: Secondary | ICD-10-CM | POA: Insufficient documentation

## 2020-01-01 DIAGNOSIS — E782 Mixed hyperlipidemia: Secondary | ICD-10-CM | POA: Diagnosis not present

## 2020-01-01 DIAGNOSIS — I10 Essential (primary) hypertension: Secondary | ICD-10-CM | POA: Insufficient documentation

## 2020-01-01 DIAGNOSIS — G8929 Other chronic pain: Secondary | ICD-10-CM | POA: Diagnosis not present

## 2020-01-01 DIAGNOSIS — M1990 Unspecified osteoarthritis, unspecified site: Secondary | ICD-10-CM | POA: Diagnosis not present

## 2020-01-01 DIAGNOSIS — K219 Gastro-esophageal reflux disease without esophagitis: Secondary | ICD-10-CM | POA: Diagnosis not present

## 2020-01-01 DIAGNOSIS — H409 Unspecified glaucoma: Secondary | ICD-10-CM | POA: Diagnosis not present

## 2020-01-01 DIAGNOSIS — Z8673 Personal history of transient ischemic attack (TIA), and cerebral infarction without residual deficits: Secondary | ICD-10-CM | POA: Diagnosis not present

## 2020-01-01 NOTE — Telephone Encounter (Signed)
Spoke with pt who was at the ED at the time of the call, advised pt that I spoke with his pharmacy regarding his BP medication refills, advised pt that his pharmacy has enough refills on the prescriptions and that he needs to pick up refills as soon as possible if her is running out, pt verbalized understanding.

## 2020-01-01 NOTE — ED Notes (Signed)
Pt name called for updated vitals, not in bathroom or outside

## 2020-01-01 NOTE — Telephone Encounter (Signed)
The patient called needing 3 of his BP medications refilled. I asked for the names of the Rx and he doesn't know the names he stated that he is only on 3 BP medications. He was on the way to the hospital and lost his BP medication from his wheelchair and he needs his BP medication refilled. He wants someone to call him and let him know if Volanda Napoleon is going to call in his medication for him.   [place], [place] Phone:  [phone]  Fax:  [phone]     Please advise

## 2020-01-01 NOTE — ED Triage Notes (Signed)
Pt. Stated, Im having high BP , I take medication for it.

## 2020-01-03 DIAGNOSIS — K219 Gastro-esophageal reflux disease without esophagitis: Secondary | ICD-10-CM | POA: Diagnosis not present

## 2020-01-03 DIAGNOSIS — M1990 Unspecified osteoarthritis, unspecified site: Secondary | ICD-10-CM | POA: Diagnosis not present

## 2020-01-03 DIAGNOSIS — E782 Mixed hyperlipidemia: Secondary | ICD-10-CM | POA: Diagnosis not present

## 2020-01-03 DIAGNOSIS — I10 Essential (primary) hypertension: Secondary | ICD-10-CM | POA: Diagnosis not present

## 2020-01-03 DIAGNOSIS — Z8673 Personal history of transient ischemic attack (TIA), and cerebral infarction without residual deficits: Secondary | ICD-10-CM | POA: Diagnosis not present

## 2020-01-03 DIAGNOSIS — H409 Unspecified glaucoma: Secondary | ICD-10-CM | POA: Diagnosis not present

## 2020-01-03 DIAGNOSIS — G8929 Other chronic pain: Secondary | ICD-10-CM | POA: Diagnosis not present

## 2020-01-08 ENCOUNTER — Telehealth: Payer: Self-pay | Admitting: *Deleted

## 2020-01-08 NOTE — Telephone Encounter (Signed)
Patient spoke with nurse triage. Patient reported that both of his feet and legs are swollen. Patient states that that this is something he does normally deal with but not this bad. Pt. does have swelling normally, but not this bad. Does not take a diuretic. No sob. Pt. has numbness sometimes. Swelling has been there for awhile, but it's getting worse.  Patient was advised to go to ED.   Clinic RN does not see where he followed through. Please advise

## 2020-01-11 DIAGNOSIS — K219 Gastro-esophageal reflux disease without esophagitis: Secondary | ICD-10-CM | POA: Diagnosis not present

## 2020-01-11 DIAGNOSIS — I10 Essential (primary) hypertension: Secondary | ICD-10-CM | POA: Diagnosis not present

## 2020-01-11 DIAGNOSIS — M1990 Unspecified osteoarthritis, unspecified site: Secondary | ICD-10-CM | POA: Diagnosis not present

## 2020-01-11 DIAGNOSIS — G8929 Other chronic pain: Secondary | ICD-10-CM | POA: Diagnosis not present

## 2020-01-11 DIAGNOSIS — H409 Unspecified glaucoma: Secondary | ICD-10-CM | POA: Diagnosis not present

## 2020-01-11 DIAGNOSIS — Z8673 Personal history of transient ischemic attack (TIA), and cerebral infarction without residual deficits: Secondary | ICD-10-CM | POA: Diagnosis not present

## 2020-01-11 DIAGNOSIS — E782 Mixed hyperlipidemia: Secondary | ICD-10-CM | POA: Diagnosis not present

## 2020-01-16 ENCOUNTER — Telehealth: Payer: Self-pay | Admitting: Family Medicine

## 2020-01-16 NOTE — Telephone Encounter (Signed)
Kristi from [facility] stated the pt has pain on his serum and upon review he has a stage 2 ulcer. She is asking for verbal orders for dressing the ulcer and to extend his skilled nursing to 1w5. She is also asking for a referral for PT due to a power chair which is causing the ulcer and he needs a new power chair because the breaks are bad plus it is not the correct height.   Steffanie Dunn can be reached at [phone] -ok to leave a detailed message per Steffanie Dunn

## 2020-01-17 ENCOUNTER — Other Ambulatory Visit: Payer: Self-pay | Admitting: Physical Medicine & Rehabilitation

## 2020-01-17 DIAGNOSIS — E782 Mixed hyperlipidemia: Secondary | ICD-10-CM | POA: Diagnosis not present

## 2020-01-17 DIAGNOSIS — Z8673 Personal history of transient ischemic attack (TIA), and cerebral infarction without residual deficits: Secondary | ICD-10-CM | POA: Diagnosis not present

## 2020-01-17 DIAGNOSIS — I10 Essential (primary) hypertension: Secondary | ICD-10-CM | POA: Diagnosis not present

## 2020-01-17 DIAGNOSIS — M1990 Unspecified osteoarthritis, unspecified site: Secondary | ICD-10-CM | POA: Diagnosis not present

## 2020-01-17 DIAGNOSIS — G8929 Other chronic pain: Secondary | ICD-10-CM | POA: Diagnosis not present

## 2020-01-17 DIAGNOSIS — H409 Unspecified glaucoma: Secondary | ICD-10-CM | POA: Diagnosis not present

## 2020-01-17 DIAGNOSIS — K219 Gastro-esophageal reflux disease without esophagitis: Secondary | ICD-10-CM | POA: Diagnosis not present

## 2020-01-17 DIAGNOSIS — F5101 Primary insomnia: Secondary | ICD-10-CM

## 2020-01-17 DIAGNOSIS — F419 Anxiety disorder, unspecified: Secondary | ICD-10-CM

## 2020-01-17 NOTE — Telephone Encounter (Signed)
Please advise 

## 2020-01-17 NOTE — Telephone Encounter (Signed)
Ok for verbal orders ?

## 2020-01-22 ENCOUNTER — Telehealth: Payer: Self-pay | Admitting: *Deleted

## 2020-01-22 NOTE — Telephone Encounter (Signed)
  FYI Spoke with pt advised that he might need to been seen sooner that next week Monday, offered pt to schedule him appointment for tomorrow 01/23/2020 with another Dr since Dr Volanda Napoleon schedule is full, pt state that he has no transpiration to bring him to the office, advised if he could go to UC or ED state that he has no choice but wait for Monday when he will have someone to bring him to the office. Pt appointment is for 01/28/2020 at 10 am.

## 2020-01-22 NOTE — Telephone Encounter (Signed)
Patient called after hours line 01/19/2020. Patient reports his legs have a lot of fluid in them and would like to know if fluid pills could be called in? He has leg swelling all the way fro the feet to the knee. They are tight. Triage nurse called patient and LVM for virtual clinic for patient and referred patient to PCP office. Patient has an appointment on 01/28/2020.

## 2020-01-23 ENCOUNTER — Other Ambulatory Visit: Payer: Self-pay | Admitting: Physical Medicine & Rehabilitation

## 2020-01-23 DIAGNOSIS — I619 Nontraumatic intracerebral hemorrhage, unspecified: Secondary | ICD-10-CM

## 2020-01-28 ENCOUNTER — Ambulatory Visit: Payer: Medicare HMO | Admitting: Family Medicine

## 2020-01-28 DIAGNOSIS — R6889 Other general symptoms and signs: Secondary | ICD-10-CM | POA: Diagnosis not present

## 2020-01-28 NOTE — Telephone Encounter (Signed)
Verbal orders given to [facility]. Drue Dun advised to reach out to [facility] for Pt orders.

## 2020-01-28 NOTE — Telephone Encounter (Signed)
Area of pain not clearly stated in previous note likely 2/2 typographical error.  Okay with VO.  Patient can be evaluated for power wheelchair with [facility].

## 2020-01-29 ENCOUNTER — Telehealth: Payer: Self-pay | Admitting: Family Medicine

## 2020-01-29 NOTE — Telephone Encounter (Signed)
[facility] stated they faxed over a [id number] form on July 8th. They wanted to make sure PCP was able to retrieve it. Home care did fax it again today but it did not come through very well (I still placed it in the red folder). They are needing this form filled out during the pt's appt on Thursday 7/15.   The pt lost his wallet along with his SSN card/liscense/etc. They contacted SSN and they are needing his medical records in order for him to retrieve a new card-I faxed the form (F:[phone]) he will need to fill out in order to retrieve his records and was told he would bring it completed on his appt.

## 2020-01-31 ENCOUNTER — Encounter: Payer: Self-pay | Admitting: Family Medicine

## 2020-01-31 ENCOUNTER — Ambulatory Visit (INDEPENDENT_AMBULATORY_CARE_PROVIDER_SITE_OTHER): Payer: Medicare HMO | Admitting: Family Medicine

## 2020-01-31 ENCOUNTER — Telehealth: Payer: Self-pay | Admitting: Family Medicine

## 2020-01-31 ENCOUNTER — Other Ambulatory Visit: Payer: Self-pay

## 2020-01-31 VITALS — BP 160/62 | HR 84 | Temp 97.4°F | Ht 69.0 in | Wt 196.0 lb

## 2020-01-31 DIAGNOSIS — R208 Other disturbances of skin sensation: Secondary | ICD-10-CM | POA: Diagnosis not present

## 2020-01-31 DIAGNOSIS — R630 Anorexia: Secondary | ICD-10-CM

## 2020-01-31 DIAGNOSIS — I10 Essential (primary) hypertension: Secondary | ICD-10-CM | POA: Diagnosis not present

## 2020-01-31 DIAGNOSIS — R430 Anosmia: Secondary | ICD-10-CM | POA: Diagnosis not present

## 2020-01-31 DIAGNOSIS — Z8673 Personal history of transient ischemic attack (TIA), and cerebral infarction without residual deficits: Secondary | ICD-10-CM

## 2020-01-31 DIAGNOSIS — R6889 Other general symptoms and signs: Secondary | ICD-10-CM | POA: Diagnosis not present

## 2020-01-31 DIAGNOSIS — R600 Localized edema: Secondary | ICD-10-CM

## 2020-01-31 NOTE — Progress Notes (Signed)
Subjective:    Patient ID: [name], male    DOB: 03-08-1951, 69 y.o.   MRN: [id number]  Chief Complaint  Patient presents with  . Follow-up    1 month f/u    HPI Patient was seen today for f/u.  Pt states he needs some forms filled out so he can get an home aid.  Pt called Flint Melter during visit who requests a 30-51 form to be completed, offers to drop off form as fax was hard to read.  Pt states bottom is sore from the wound that has been present x a while.  Pt also with LE edema. Pt presents to clinic in a motorized wheel chair he obtained from [employer].  States he does not stay in the wheelchair all day. Notes decreased appetite since not being able taste or smell his food x several months.  Pt states he will use pink salt when cooking, but does not use much.  Pt wants his bp back down so he can return to PT.  Past Medical History:  Diagnosis Date  . Arthritis   . Cameron ulcer, chronic 01/11/2018  . Chronic back pain   . GERD (gastroesophageal reflux disease)    uses Baking Soda  . GIB (gastrointestinal bleeding) 2012  . Glaucoma    right eye  . Headache    occasionally  . Hepatitis C   . Hiatal hernia with gastroesophageal reflux disease and esophagitis 01/05/2018  . History of blood transfusion    no abnormal reaction noted  . History of gout    doesn't take any meds  . Hyperlipidemia    not on any meds  . Hypertension    takes Amlodipine and Lisinopril daily  . Insomnia    takes Ambien nightly  . Ischemic colitis ([place]) 2012  . Joint pain   . Nocturia   . Numbness    both legs occasionally  . PONV (postoperative nausea and vomiting)   . Prostate cancer ([place])   . Shortness of breath dyspnea    rarely but when notices he can be lying/sitting/exertion.Dr.Hochrein is aware per pt  . Stroke ([place]) 2016   . Urinary frequency   . Urinary urgency     Allergies  Allergen Reactions  . Penicillins Anaphylaxis    Has patient had a PCN reaction  causing immediate rash, facial/tongue/throat swelling, SOB or lightheadedness with hypotension: Yes Has patient had a PCN reaction causing severe rash involving mucus membranes or skin necrosis: No Has patient had a PCN reaction that required hospitalization: No Has patient had a PCN reaction occurring within the last 10 years: No If all of the above answers are "NO", then may proceed with Cephalosporin use..  . Sudafed [Pseudoephedrine] Other (See Comments)    Heart "races"    ROS General: Denies fever, chills, night sweats, changes in weight, changes in appetite HEENT: Denies headaches, ear pain, changes in vision, rhinorrhea, sore throat CV: Denies CP, palpitations, SOB, orthopnea  +LE edema Pulm: Denies SOB, cough, wheezing GI: Denies abdominal pain, nausea, vomiting, diarrhea, constipation GU: Denies dysuria, hematuria, frequency, vaginal discharge Msk: Denies muscle cramps, joint pains Neuro: Denies weakness, numbness, tingling Skin: Denies rashes, bruising  +sore on buttock Psych: Denies depression, anxiety, hallucinations     Objective:    Blood pressure (!) 160/62, pulse 84, temperature (!) 97.4 F (36.3 C), temperature source Other (Comment), height '5\' 9"'$  (1.753 m), weight 196 lb (88.9 kg), SpO2 99 %.  Gen.  Pleasant, well-nourished, in no distress, normal affect   HEENT: [place]/AT, face symmetric, no scleral icterus, PERRLA, EOMI, nares patent without drainage Lungs: no accessory muscle use, CTAB, no wheezes or rales Cardiovascular: RRR, no m/r/g, 1 + pitting edema in b/l LE to shin, negative Homans' sign. Musculoskeletal: No deformities, no cyanosis or clubbing Neuro:  A&Ox3, CN II-XII intact, gait not assessed as sitting in motorized wheelchair.  Patient was able to stand up in the chair without difficulty. Skin:  Warm, no lesions/ rash  Skin abrasion previously present in gluteal cleft proximal to anus healed, area without erythema, drainage, or lesions.   Wt Readings  from Last 3 Encounters:  01/31/20 196 lb (88.9 kg)  01/01/20 195 lb (88.5 kg)  12/28/19 192 lb (87.1 kg)    Lab Results  Component Value Date   WBC 4.3 09/20/2019   HGB 7.2 (L) 09/20/2019   HCT 29.1 (L) 09/20/2019   PLT 403 (H) 09/20/2019   GLUCOSE 96 09/20/2019   CHOL 102 07/22/2018   TRIG 108 07/22/2018   HDL 35 (L) 07/22/2018   LDLCALC 45 07/22/2018   ALT 15 09/20/2019   AST 16 09/20/2019   NA 141 09/20/2019   K 4.4 09/20/2019   CL 110 09/20/2019   CREATININE 0.95 09/20/2019   BUN 10 09/20/2019   CO2 21 (L) 09/20/2019   TSH 0.70 09/19/2019   INR 1.23 07/20/2018   HGBA1C 4.7 (L) 07/22/2018    Assessment/Plan:  Bilateral lower extremity edema -likely 2/2 dependent edema vs medications-Norvasc.  Must also consider CHF, DVT, and h/o CVA despite R side hemiparesis  -Discussed supportive care including elevating lower extremities, limiting sodium intake when possible. -Consider compression socks or TED hose. -Will obtain labs.  Based on renal function will start a chlorthalidone daily and decrease Norvasc as may be contributing to lower extremity edema. - Plan: Brain Natriuretic Peptide, CBC (no diff), CMP with eGFR([facility]), TSH, T4, Free  Essential hypertension  -elevated, but improving -pt encouraged to check bp at home -continue lifestyle modifications -for now continue current medications: lisinopril 40 mg, hydralazine 25 mg TID, and norvasc 10 mg.  Will likely decrease vs d/c Norvasc 2/2 pt's increasing LE edema. -Will make medication adjustments based on labs. -for continued bp elevation consider renal u/s. - Plan: CMP with eGFR([facility])  History of CVA (cerebrovascular accident) -pt advised he can restart PT.  Will contact office. -pt advised to avoid staying in wheelchair for prolonged periods of time. Encouraged to ambulate around the house using walker. -forms for personal care services to be dropped off by [employer].  Will complete when made  available.  Anosmia -chronic since 03/2019 -possibly 2/2 h/o CVA.  Must also consider anosmia as a possible result of a previous COVID infection. -given continued symptom consider COVID ab testing and possible imaging such as CT head to evaluate for new lesion or mass.  Decreased appetite -anosmia contributing.  Also consider depression- PHQ 9 score 9 on 68/21. -discussed supplemental drinks such as boost or ensure -encouraged to schedule small meals -last colonoscopy 2017 with Dr. Loletha Carrow. -consider f/u with GI.  Skin soreness -previous lesion healed -discussed the importance of keeping skin clean and dry given use of adult briefs -also discussed pressure ulcer prevention. -pt advised to f/u with PM&R for wheelchair assessment for proper seat cushion  F/u next wk for continued or worsened symptoms.  [name], MD

## 2020-01-31 NOTE — Telephone Encounter (Signed)
Pt is calling in stating that the pt need to have a letter sent to rehab and they can not be called to reinstate the pt.  Pt state that the reason why they took him out is due blood pressure and now his blood pressure is done.  Pt stated that he is ready to go back next week.

## 2020-01-31 NOTE — Patient Instructions (Addendum)
Peripheral Edema  Peripheral edema is swelling that is caused by a buildup of fluid. Peripheral edema most often affects the lower legs, ankles, and feet. It can also develop in the arms, hands, and face. The area of the body that has peripheral edema will look swollen. It may also feel heavy or warm. Your clothes may start to feel tight. Pressing on the area may make a temporary dent in your skin. You may not be able to move your swollen arm or leg as much as usual. There are many causes of peripheral edema. It can happen because of a complication of other conditions such as congestive heart failure, kidney disease, or a problem with your blood circulation. It also can be a side effect of certain medicines or because of an infection. It often happens to women during pregnancy. Sometimes, the cause is not known. Follow these instructions at home: Managing pain, stiffness, and swelling   Raise (elevate) your legs while you are sitting or lying down.  Move around often to prevent stiffness and to lessen swelling.  Do not sit or stand for long periods of time.  Wear support stockings as told by your health care provider. Medicines  Take over-the-counter and prescription medicines only as told by your health care provider.  Your health care provider may prescribe medicine to help your body get rid of excess water (diuretic). General instructions  Pay attention to any changes in your symptoms.  Follow instructions from your health care provider about limiting salt (sodium) in your diet. Sometimes, eating less salt may reduce swelling.  Moisturize skin daily to help prevent skin from cracking and draining.  Keep all follow-up visits as told by your health care provider. This is important. Contact a health care provider if you have:  A fever.  Edema that starts suddenly or is getting worse, especially if you are pregnant or have a medical condition.  Swelling in only one  leg.  Increased swelling, redness, or pain in one or both of your legs.  Drainage or sores at the area where you have edema. Get help right away if you:  Develop shortness of breath, especially when you are lying down.  Have pain in your chest or abdomen.  Feel weak.  Feel faint. Summary  Peripheral edema is swelling that is caused by a buildup of fluid. Peripheral edema most often affects the lower legs, ankles, and feet.  Move around often to prevent stiffness and to lessen swelling. Do not sit or stand for long periods of time.  Pay attention to any changes in your symptoms.  Contact a health care provider if you have edema that starts suddenly or is getting worse, especially if you are pregnant or have a medical condition.  Get help right away if you develop shortness of breath, especially when lying down. This information is not intended to replace advice given to you by your health care provider. Make sure you discuss any questions you have with your health care provider. Document Revised: 03/29/2018 Document Reviewed: 03/29/2018 Elsevier Patient Education  2020 [employer] Your Hypertension Hypertension is commonly called high blood pressure. This is when the force of your blood pressing against the walls of your arteries is too strong. Arteries are blood vessels that carry blood from your heart throughout your body. Hypertension forces the heart to work harder to pump blood, and may cause the arteries to become narrow or stiff. Having untreated or uncontrolled hypertension can cause heart attack,  stroke, kidney disease, and other problems. What are blood pressure readings? A blood pressure reading consists of a higher number over a lower number. Ideally, your blood pressure should be below 120/80. The first ("top") number is called the systolic pressure. It is a measure of the pressure in your arteries as your heart beats. The second ("bottom") number is called  the diastolic pressure. It is a measure of the pressure in your arteries as the heart relaxes. What does my blood pressure reading mean? Blood pressure is classified into four stages. Based on your blood pressure reading, your health care provider may use the following stages to determine what type of treatment you need, if any. Systolic pressure and diastolic pressure are measured in a unit called mm Hg. Normal  Systolic pressure: below 712.  Diastolic pressure: below 80. Elevated  Systolic pressure: 458-099.  Diastolic pressure: below 80. Hypertension stage 1  Systolic pressure: 833-825.  Diastolic pressure: 05-39. Hypertension stage 2  Systolic pressure: 767 or above.  Diastolic pressure: 90 or above. What health risks are associated with hypertension? Managing your hypertension is an important responsibility. Uncontrolled hypertension can lead to:  A heart attack.  A stroke.  A weakened blood vessel (aneurysm).  Heart failure.  Kidney damage.  Eye damage.  Metabolic syndrome.  Memory and concentration problems. What changes can I make to manage my hypertension? Hypertension can be managed by making lifestyle changes and possibly by taking medicines. Your health care provider will help you make a plan to bring your blood pressure within a normal range. Eating and drinking   Eat a diet that is high in fiber and potassium, and low in salt (sodium), added sugar, and fat. An example eating plan is called the DASH (Dietary Approaches to Stop Hypertension) diet. To eat this way: ? Eat plenty of fresh fruits and vegetables. Try to fill half of your plate at each meal with fruits and vegetables. ? Eat whole grains, such as whole wheat pasta, brown rice, or whole grain bread. Fill about one quarter of your plate with whole grains. ? Eat low-fat diary products. ? Avoid fatty cuts of meat, processed or cured meats, and poultry with skin. Fill about one quarter of your plate  with lean proteins such as fish, chicken without skin, beans, eggs, and tofu. ? Avoid premade and processed foods. These tend to be higher in sodium, added sugar, and fat.  Reduce your daily sodium intake. Most people with hypertension should eat less than 1,500 mg of sodium a day.  Limit alcohol intake to no more than 1 drink a day for nonpregnant women and 2 drinks a day for men. One drink equals 12 oz of beer, 5 oz of wine, or 1 oz of hard liquor. Lifestyle  Work with your health care provider to maintain a healthy body weight, or to lose weight. Ask what an ideal weight is for you.  Get at least 30 minutes of exercise that causes your heart to beat faster (aerobic exercise) most days of the week. Activities may include walking, swimming, or biking.  Include exercise to strengthen your muscles (resistance exercise), such as weight lifting, as part of your weekly exercise routine. Try to do these types of exercises for 30 minutes at least 3 days a week.  Do not use any products that contain nicotine or tobacco, such as cigarettes and e-cigarettes. If you need help quitting, ask your health care provider.  Control any long-term (chronic) conditions you have, such as  high cholesterol or diabetes. Monitoring  Monitor your blood pressure at home as told by your health care provider. Your personal target blood pressure may vary depending on your medical conditions, your age, and other factors.  Have your blood pressure checked regularly, as often as told by your health care provider. Working with your health care provider  Review all the medicines you take with your health care provider because there may be side effects or interactions.  Talk with your health care provider about your diet, exercise habits, and other lifestyle factors that may be contributing to hypertension.  Visit your health care provider regularly. Your health care provider can help you create and adjust your plan for  managing hypertension. Will I need medicine to control my blood pressure? Your health care provider may prescribe medicine if lifestyle changes are not enough to get your blood pressure under control, and if:  Your systolic blood pressure is 130 or higher.  Your diastolic blood pressure is 80 or higher. Take medicines only as told by your health care provider. Follow the directions carefully. Blood pressure medicines must be taken as prescribed. The medicine does not work as well when you skip doses. Skipping doses also puts you at risk for problems. Contact a health care provider if:  You think you are having a reaction to medicines you have taken.  You have repeated (recurrent) headaches.  You feel dizzy.  You have swelling in your ankles.  You have trouble with your vision. Get help right away if:  You develop a severe headache or confusion.  You have unusual weakness or numbness, or you feel faint.  You have severe pain in your chest or abdomen.  You vomit repeatedly.  You have trouble breathing. Summary  Hypertension is when the force of blood pumping through your arteries is too strong. If this condition is not controlled, it may put you at risk for serious complications.  Your personal target blood pressure may vary depending on your medical conditions, your age, and other factors. For most people, a normal blood pressure is less than 120/80.  Hypertension is managed by lifestyle changes, medicines, or both. Lifestyle changes include weight loss, eating a healthy, low-sodium diet, exercising more, and limiting alcohol. This information is not intended to replace advice given to you by your health care provider. Make sure you discuss any questions you have with your health care provider. Document Revised: 10/27/2018 Document Reviewed: 06/02/2016 Elsevier Patient Education  [place].

## 2020-02-01 LAB — COMPLETE METABOLIC PANEL WITH GFR
AG Ratio: 1.6 (calc) (ref 1.0–2.5)
ALT: 7 U/L — ABNORMAL LOW (ref 9–46)
AST: 10 U/L (ref 10–35)
Albumin: 4.3 g/dL (ref 3.6–5.1)
Alkaline phosphatase (APISO): 109 U/L (ref 35–144)
BUN: 16 mg/dL (ref 7–25)
CO2: 22 mmol/L (ref 20–32)
Calcium: 9.4 mg/dL (ref 8.6–10.3)
Chloride: 111 mmol/L — ABNORMAL HIGH (ref 98–110)
Creat: 0.99 mg/dL (ref 0.70–1.18)
GFR, Est African American: 89 mL/min/{1.73_m2} (ref >=60)
GFR, Est Non African American: 77 mL/min/{1.73_m2} (ref >=60)
Globulin: 2.7 g/dL (calc) (ref 1.9–3.7)
Glucose, Bld: 84 mg/dL (ref 65–99)
Potassium: 4.4 mmol/L (ref 3.5–5.3)
Sodium: 143 mmol/L (ref 135–146)
Total Bilirubin: 0.5 mg/dL (ref 0.2–1.2)
Total Protein: 7 g/dL (ref 6.1–8.1)

## 2020-02-01 LAB — T4, FREE: Free T4: 0.9 ng/dL (ref 0.8–1.8)

## 2020-02-01 LAB — CBC
HCT: 30.3 % — ABNORMAL LOW (ref 38.5–50.0)
Hemoglobin: 8.2 g/dL — ABNORMAL LOW (ref 13.2–17.1)
MCH: 19.9 pg — ABNORMAL LOW (ref 27.0–33.0)
MCHC: 27.1 g/dL — ABNORMAL LOW (ref 32.0–36.0)
MCV: 73.4 fL — ABNORMAL LOW (ref 80.0–100.0)
MPV: 9.2 fL (ref 7.5–12.5)
Platelets: 429 10*3/uL — ABNORMAL HIGH (ref 140–400)
RBC: 4.13 10*6/uL — ABNORMAL LOW (ref 4.20–5.80)
RDW: 18.2 % — ABNORMAL HIGH (ref 11.0–15.0)
WBC: 5.9 10*3/uL (ref 3.8–10.8)

## 2020-02-01 LAB — TSH: TSH: 0.36 mIU/L — ABNORMAL LOW (ref 0.40–4.50)

## 2020-02-01 LAB — BRAIN NATRIURETIC PEPTIDE: Brain Natriuretic Peptide: 24 pg/mL (ref <100)

## 2020-02-01 NOTE — Telephone Encounter (Signed)
PPW was received in office and placed in providers desk to fill out.

## 2020-02-01 NOTE — Telephone Encounter (Signed)
Please advise 

## 2020-02-04 ENCOUNTER — Telehealth: Payer: Self-pay | Admitting: Family Medicine

## 2020-02-04 DIAGNOSIS — I10 Essential (primary) hypertension: Secondary | ICD-10-CM | POA: Diagnosis not present

## 2020-02-04 DIAGNOSIS — Z8673 Personal history of transient ischemic attack (TIA), and cerebral infarction without residual deficits: Secondary | ICD-10-CM | POA: Diagnosis not present

## 2020-02-04 DIAGNOSIS — G8929 Other chronic pain: Secondary | ICD-10-CM | POA: Diagnosis not present

## 2020-02-04 DIAGNOSIS — K219 Gastro-esophageal reflux disease without esophagitis: Secondary | ICD-10-CM | POA: Diagnosis not present

## 2020-02-04 DIAGNOSIS — M1990 Unspecified osteoarthritis, unspecified site: Secondary | ICD-10-CM | POA: Diagnosis not present

## 2020-02-04 DIAGNOSIS — H409 Unspecified glaucoma: Secondary | ICD-10-CM | POA: Diagnosis not present

## 2020-02-04 DIAGNOSIS — E782 Mixed hyperlipidemia: Secondary | ICD-10-CM | POA: Diagnosis not present

## 2020-02-04 NOTE — Telephone Encounter (Signed)
Pt stated he called several times regarding the letter and is getting upset that no one has reached out to him yet. Informed pt that we ask for 2-3 business days for a response and will send a reminder note back.  Pt can be reached at [phone]

## 2020-02-04 NOTE — Telephone Encounter (Signed)
[name] with [facility] is calling to get verbal orders for OT evaluation and PT with frequency of 2 weeks 2  1 week 1.  [name] state that a detail msg can be left on her secured voicemail.

## 2020-02-05 ENCOUNTER — Telehealth: Payer: Self-pay | Admitting: Family Medicine

## 2020-02-05 ENCOUNTER — Other Ambulatory Visit: Payer: Self-pay | Admitting: Family Medicine

## 2020-02-05 DIAGNOSIS — I10 Essential (primary) hypertension: Secondary | ICD-10-CM

## 2020-02-05 MED ORDER — CHLORTHALIDONE 25 MG PO TABS: 25.0000 mg | ORAL_TABLET | Freq: Every day | ORAL | 2 refills | Status: DC

## 2020-02-05 MED ORDER — POTASSIUM CHLORIDE CRYS ER 20 MEQ PO TBCR: 20.0000 meq | EXTENDED_RELEASE_TABLET | Freq: Every day | ORAL | 2 refills | Status: DC

## 2020-02-05 NOTE — Telephone Encounter (Signed)
Ok for the verbal orders.

## 2020-02-05 NOTE — Telephone Encounter (Signed)
This was taking care of. Called [facility] and they stated they will let the providers know that pt is able to return and get him in the schedule. Pt notified of update today. Pt verbalized understanding.

## 2020-02-05 NOTE — Telephone Encounter (Signed)
This matter was to be addressed at pt's recent OFV.  CMA was asked to contact pt's rehab/PT and notify them that he was ok to return.

## 2020-02-05 NOTE — Telephone Encounter (Signed)
Pt medical form was received and pt has completed the form, Form given to Ms Joycelyn Schmid for release of the record.

## 2020-02-05 NOTE — Telephone Encounter (Signed)
Flint Melter the care Coordinator with [facility] is calling to see if the [place] [phone] had been faxed back to area on the form.  Otila Kluver would like to have a call back [phone] to let her know.

## 2020-02-05 NOTE — Telephone Encounter (Signed)
Please advise 

## 2020-02-06 ENCOUNTER — Encounter: Payer: Self-pay | Admitting: Family Medicine

## 2020-02-06 ENCOUNTER — Telehealth: Payer: Self-pay | Admitting: Family Medicine

## 2020-02-06 NOTE — Telephone Encounter (Signed)
Patient is returning a call from the office.  He also wants to know if a letter has been wrote to his rehab dr to be able to return to rehab.

## 2020-02-06 NOTE — Telephone Encounter (Signed)
Spoke with [name] with [facility] regarding pt form, state that pt has Medicaid ant that they will cover for pt to have a PCS Aide due to pt diagnoses. States that she is waiting for Dr Volanda Napoleon to complete the form for pt to get approved for [facility]  service

## 2020-02-06 NOTE — Telephone Encounter (Signed)
Spoke with pt reviewed lab results and pt verbalized understanding of the results and Dr Volanda Napoleon recommendations, pt is aware to stop taking his amlodipine 10 mg since it could be causing edema on pt lower extremities. Pt was advised to pick up the new blood pressure medication Chlorthalidone 25 mg from his pharmacy and start. Pt state that he is checking his BP 3 times daily and his readings are still elevated 160's/80's. Pt is scheduled for a f/u visit for BP check in 4-6 weeks per Dr Volanda Napoleon, advised to bring his BP cuff, medications and records of BP at his appointment so Dr Volanda Napoleon can review. Pt advised that the office is not able to provide any letter to his rehab stating that he can return for his PT until his BP is under control. Pt verbalized understanding

## 2020-02-08 ENCOUNTER — Telehealth: Payer: Self-pay | Admitting: Family Medicine

## 2020-02-08 DIAGNOSIS — H409 Unspecified glaucoma: Secondary | ICD-10-CM | POA: Diagnosis not present

## 2020-02-08 DIAGNOSIS — M1990 Unspecified osteoarthritis, unspecified site: Secondary | ICD-10-CM | POA: Diagnosis not present

## 2020-02-08 DIAGNOSIS — K219 Gastro-esophageal reflux disease without esophagitis: Secondary | ICD-10-CM | POA: Diagnosis not present

## 2020-02-08 DIAGNOSIS — E782 Mixed hyperlipidemia: Secondary | ICD-10-CM | POA: Diagnosis not present

## 2020-02-08 DIAGNOSIS — Z8673 Personal history of transient ischemic attack (TIA), and cerebral infarction without residual deficits: Secondary | ICD-10-CM | POA: Diagnosis not present

## 2020-02-08 DIAGNOSIS — I10 Essential (primary) hypertension: Secondary | ICD-10-CM | POA: Diagnosis not present

## 2020-02-08 DIAGNOSIS — G8929 Other chronic pain: Secondary | ICD-10-CM | POA: Diagnosis not present

## 2020-02-08 NOTE — Telephone Encounter (Signed)
Spoke with [name], advised that pt PCS forms have been completed and faxed as requested, [name] state that she received orders

## 2020-02-08 NOTE — Telephone Encounter (Signed)
Otila Kluver called back to check on the forms completion. The deadline for this has past and needs this information no later than Monday July 26th.   Otila Kluver can be reached at [phone]

## 2020-02-08 NOTE — Telephone Encounter (Signed)
I am no longer accepting patients at this time.

## 2020-02-08 NOTE — Telephone Encounter (Signed)
Spoke with Charrisse with [facility] regarding pt verbal orders, Advised that Dr Volanda Napoleon approved the verbal orders

## 2020-02-08 NOTE — Telephone Encounter (Signed)
[name] asked if the forms can be faxed to her at [phone] and the [place] location at [phone]

## 2020-02-08 NOTE — Telephone Encounter (Signed)
Ok

## 2020-02-08 NOTE — Telephone Encounter (Signed)
Pt calling requesting transfer from Dr. Volanda Napoleon to [facility]. Dr. Volanda Napoleon is ok with the transfer.  Tommi Rumps, are you ok with the transfer? Please Advise.

## 2020-02-08 NOTE — Telephone Encounter (Signed)
Ok to transfer. 

## 2020-02-08 NOTE — Telephone Encounter (Signed)
Pt is not happy with his PCP Dr. Volanda Napoleon and asked for his care to be transferred to [facility] [place]. Informed pt we will have to get approval from both in order to [facility].   Pt has an upcoming appt on August 4th and would like for that appt to be with [facility] as well.   Pt can be reached at [phone]

## 2020-02-08 NOTE — Telephone Encounter (Signed)
ok for pt to transfer care to [facility]

## 2020-02-11 DIAGNOSIS — E782 Mixed hyperlipidemia: Secondary | ICD-10-CM | POA: Diagnosis not present

## 2020-02-11 DIAGNOSIS — G8929 Other chronic pain: Secondary | ICD-10-CM | POA: Diagnosis not present

## 2020-02-11 DIAGNOSIS — M1990 Unspecified osteoarthritis, unspecified site: Secondary | ICD-10-CM | POA: Diagnosis not present

## 2020-02-11 DIAGNOSIS — Z8673 Personal history of transient ischemic attack (TIA), and cerebral infarction without residual deficits: Secondary | ICD-10-CM | POA: Diagnosis not present

## 2020-02-11 DIAGNOSIS — I10 Essential (primary) hypertension: Secondary | ICD-10-CM | POA: Diagnosis not present

## 2020-02-11 DIAGNOSIS — H409 Unspecified glaucoma: Secondary | ICD-10-CM | POA: Diagnosis not present

## 2020-02-11 DIAGNOSIS — K219 Gastro-esophageal reflux disease without esophagitis: Secondary | ICD-10-CM | POA: Diagnosis not present

## 2020-02-11 NOTE — Telephone Encounter (Signed)
Pt request to transfer care to [facility], Dr Volanda Napoleon is ok with pt request

## 2020-02-12 ENCOUNTER — Telehealth: Payer: Self-pay

## 2020-02-12 ENCOUNTER — Ambulatory Visit (INDEPENDENT_AMBULATORY_CARE_PROVIDER_SITE_OTHER): Payer: Medicare HMO | Admitting: Family Medicine

## 2020-02-12 ENCOUNTER — Emergency Department (HOSPITAL_COMMUNITY): Payer: Medicare HMO

## 2020-02-12 ENCOUNTER — Encounter: Payer: Self-pay | Admitting: Family Medicine

## 2020-02-12 ENCOUNTER — Other Ambulatory Visit: Payer: Self-pay

## 2020-02-12 ENCOUNTER — Encounter (HOSPITAL_COMMUNITY): Payer: Self-pay | Admitting: Emergency Medicine

## 2020-02-12 ENCOUNTER — Emergency Department (HOSPITAL_COMMUNITY)
Admission: EM | Admit: 2020-02-12 | Discharge: 2020-02-12 | Disposition: A | Discharge status: Home / Self Care | Payer: Medicare HMO | Attending: Emergency Medicine | Admitting: Emergency Medicine

## 2020-02-12 VITALS — BP 160/80 | HR 83 | Temp 98.9°F | Wt 194.6 lb

## 2020-02-12 DIAGNOSIS — R42 Dizziness and giddiness: Secondary | ICD-10-CM | POA: Diagnosis not present

## 2020-02-12 DIAGNOSIS — S0990XA Unspecified injury of head, initial encounter: Secondary | ICD-10-CM | POA: Diagnosis not present

## 2020-02-12 DIAGNOSIS — L89152 Pressure ulcer of sacral region, stage 2: Secondary | ICD-10-CM | POA: Diagnosis not present

## 2020-02-12 DIAGNOSIS — Z8673 Personal history of transient ischemic attack (TIA), and cerebral infarction without residual deficits: Secondary | ICD-10-CM

## 2020-02-12 DIAGNOSIS — Z79899 Other long term (current) drug therapy: Secondary | ICD-10-CM | POA: Diagnosis not present

## 2020-02-12 DIAGNOSIS — R61 Generalized hyperhidrosis: Secondary | ICD-10-CM | POA: Diagnosis not present

## 2020-02-12 DIAGNOSIS — I6389 Other cerebral infarction: Secondary | ICD-10-CM

## 2020-02-12 DIAGNOSIS — S199XXA Unspecified injury of neck, initial encounter: Secondary | ICD-10-CM | POA: Diagnosis not present

## 2020-02-12 DIAGNOSIS — F17210 Nicotine dependence, cigarettes, uncomplicated: Secondary | ICD-10-CM | POA: Insufficient documentation

## 2020-02-12 DIAGNOSIS — I10 Essential (primary) hypertension: Secondary | ICD-10-CM | POA: Insufficient documentation

## 2020-02-12 DIAGNOSIS — R110 Nausea: Secondary | ICD-10-CM | POA: Diagnosis not present

## 2020-02-12 DIAGNOSIS — R55 Syncope and collapse: Secondary | ICD-10-CM | POA: Insufficient documentation

## 2020-02-12 DIAGNOSIS — K449 Diaphragmatic hernia without obstruction or gangrene: Secondary | ICD-10-CM | POA: Diagnosis not present

## 2020-02-12 LAB — COMPREHENSIVE METABOLIC PANEL
ALT: 24 U/L (ref 0–44)
AST: 23 U/L (ref 15–41)
Albumin: 4 g/dL (ref 3.5–5.0)
Alkaline Phosphatase: 98 U/L (ref 38–126)
Anion gap: 7 (ref 5–15)
BUN: 7 mg/dL — ABNORMAL LOW (ref 8–23)
CO2: 22 mmol/L (ref 22–32)
Calcium: 8.8 mg/dL — ABNORMAL LOW (ref 8.9–10.3)
Chloride: 109 mmol/L (ref 98–111)
Creatinine, Ser: 0.76 mg/dL (ref 0.61–1.24)
GFR calc Af Amer: >60 mL/min (ref >60)
GFR calc non Af Amer: >60 mL/min (ref >60)
Glucose, Bld: 110 mg/dL — ABNORMAL HIGH (ref 70–99)
Potassium: 3.5 mmol/L (ref 3.5–5.1)
Sodium: 138 mmol/L (ref 135–145)
Total Bilirubin: 0.4 mg/dL (ref 0.3–1.2)
Total Protein: 7.3 g/dL (ref 6.5–8.1)

## 2020-02-12 LAB — CBC WITH DIFFERENTIAL/PLATELET
Abs Immature Granulocytes: 0.04 10*3/uL (ref 0.00–0.07)
Basophils Absolute: 0.1 10*3/uL (ref 0.0–0.1)
Basophils Relative: 1 %
Eosinophils Absolute: 0.1 10*3/uL (ref 0.0–0.5)
Eosinophils Relative: 1 %
HCT: 29.9 % — ABNORMAL LOW (ref 39.0–52.0)
Hemoglobin: 7.9 g/dL — ABNORMAL LOW (ref 13.0–17.0)
Immature Granulocytes: 1 %
Lymphocytes Relative: 24 %
Lymphs Abs: 1.6 10*3/uL (ref 0.7–4.0)
MCH: 19.5 pg — ABNORMAL LOW (ref 26.0–34.0)
MCHC: 26.4 g/dL — ABNORMAL LOW (ref 30.0–36.0)
MCV: 73.8 fL — ABNORMAL LOW (ref 80.0–100.0)
Monocytes Absolute: 0.6 10*3/uL (ref 0.1–1.0)
Monocytes Relative: 8 %
Neutro Abs: 4.4 10*3/uL (ref 1.7–7.7)
Neutrophils Relative %: 65 %
Platelets: 445 10*3/uL — ABNORMAL HIGH (ref 150–400)
RBC: 4.05 MIL/uL — ABNORMAL LOW (ref 4.22–5.81)
RDW: 19.5 % — ABNORMAL HIGH (ref 11.5–15.5)
WBC: 6.7 10*3/uL (ref 4.0–10.5)
nRBC: 0 % (ref 0.0–0.2)

## 2020-02-12 LAB — TROPONIN I (HIGH SENSITIVITY)
Troponin I (High Sensitivity): 7 ng/L (ref <18)
Troponin I (High Sensitivity): 15 ng/L (ref <18)

## 2020-02-12 LAB — URINALYSIS, ROUTINE W REFLEX MICROSCOPIC
Bacteria, UA: NONE SEEN
Bilirubin Urine: NEGATIVE
Glucose, UA: NEGATIVE mg/dL
Hgb urine dipstick: NEGATIVE
Ketones, ur: NEGATIVE mg/dL
Leukocytes,Ua: NEGATIVE
Nitrite: NEGATIVE
Protein, ur: 30 mg/dL — AB
Specific Gravity, Urine: 1.009 (ref 1.005–1.030)
pH: 8 (ref 5.0–8.0)

## 2020-02-12 LAB — LIPASE, BLOOD: Lipase: 23 U/L (ref 11–51)

## 2020-02-12 MED ORDER — VERAPAMIL HCL ER 180 MG PO TBCR
180.0000 mg | EXTENDED_RELEASE_TABLET | Freq: Every day | ORAL | 0 refills | Status: DC
Start: 2020-02-12 — End: 2020-03-21

## 2020-02-12 MED ORDER — SODIUM CHLORIDE 0.9 % IV BOLUS
500.0000 mL | Freq: Once | INTRAVENOUS | Status: AC
  Administered 2020-02-12: 500 mL via INTRAVENOUS

## 2020-02-12 MED ORDER — FENTANYL CITRATE (PF) 100 MCG/2ML IJ SOLN
50.0000 ug | Freq: Once | INTRAMUSCULAR | Status: AC
  Administered 2020-02-12: 50 ug via INTRAVENOUS
  Filled 2020-02-12: qty 2

## 2020-02-12 MED ORDER — TEMAZEPAM 30 MG PO CAPS
30.0000 mg | ORAL_CAPSULE | Freq: Every evening | ORAL | 0 refills | Status: DC | PRN
Start: 2020-02-12 — End: 2020-03-19

## 2020-02-12 MED ORDER — PROCHLORPERAZINE EDISYLATE 10 MG/2ML IJ SOLN
10.0000 mg | Freq: Once | INTRAMUSCULAR | Status: AC
  Administered 2020-02-12: 10 mg via INTRAVENOUS
  Filled 2020-02-12: qty 2

## 2020-02-12 MED ORDER — ONDANSETRON HCL 4 MG/2ML IJ SOLN
4.0000 mg | Freq: Once | INTRAMUSCULAR | Status: AC
  Administered 2020-02-12: 4 mg via INTRAVENOUS
  Filled 2020-02-12: qty 2

## 2020-02-12 NOTE — Telephone Encounter (Signed)
Galima with [facility] called to report that pt BP is still elevated at 180 /80, called pt to check how he was doing, advised to check his BP while on the phone with him, pt state that BP was 205/81, pt denied any headaches, dizziness, blurry vision or any symptoms related to high BP, pt was advised to schedule appointment with another Dr at the office since Dr Volanda Napoleon is off, pt complaint that he has no way to get to the office and that he does not want to see Dr Volanda Napoleon and that he wants to transfer his care to [facility] who is currently not taking new pt, advised pt to then to the ED for further evaluation for his BP. Pt state that he will call the daughter to see if she is available to bring him to the office pt Advised that Dr Sarajane Jews would see him at 11.30 am and advised to call the office and confirm the visit. Notified Clinical supervisor who advised pt to make a point of coming in the office for BP check or go to the ED. Finally pt agreed to scheduling appointment with Dr Sarajane Jews at 11.30 am, advised to bring his medications and his BP monitoring cuff with him. Verbalized understanding

## 2020-02-12 NOTE — ED Notes (Signed)
Patient verbalizes understanding of discharge instructions. Opportunity for questioning and answers were provided. Armband removed by staff, pt discharged from ED ambulatory w/ daughter  

## 2020-02-12 NOTE — ED Provider Notes (Signed)
[facility] EMERGENCY DEPARTMENT Provider Note   CSN: [id number] Arrival date & time: 02/12/20  1309     History Chief Complaint  Patient presents with  . Loss of Consciousness    [name] is a 69 y.o. male.  The history is provided by the patient.  Loss of Consciousness Episode history:  Single Most recent episode:  Today Progression:  Resolved Chronicity:  New Context: normal activity   Witnessed: yes   Relieved by:  Nothing Worsened by:  Nothing Associated symptoms: diaphoresis, dizziness, headaches and malaise/fatigue   Associated symptoms: no anxiety, no chest pain, no confusion, no difficulty breathing, no fever, no focal sensory loss, no focal weakness, no nausea, no palpitations, no rectal bleeding, no seizures, no shortness of breath and no vomiting   Risk factors comment:  HLD, HTN, Stroke, GI bleed in past      Past Medical History:  Diagnosis Date  . Arthritis   . Cameron ulcer, chronic 01/11/2018  . Chronic back pain   . GERD (gastroesophageal reflux disease)    uses Baking Soda  . GIB (gastrointestinal bleeding) 2012  . Glaucoma    right eye  . Headache    occasionally  . Hepatitis C   . Hiatal hernia with gastroesophageal reflux disease and esophagitis 01/05/2018  . History of blood transfusion    no abnormal reaction noted  . History of gout    doesn't take any meds  . Hyperlipidemia    not on any meds  . Hypertension    takes Amlodipine and Lisinopril daily  . Insomnia    takes Ambien nightly  . Ischemic colitis ([place]) 2012  . Joint pain   . Nocturia   . Numbness    both legs occasionally  . PONV (postoperative nausea and vomiting)   . Prostate cancer ([place])   . Shortness of breath dyspnea    rarely but when notices he can be lying/sitting/exertion.Dr.Hochrein is aware per pt  . Stroke ([place]) 2016   . Urinary frequency   . Urinary urgency     Patient Active Problem List   Diagnosis Date Noted  . Reactive  depression 10/10/2019  . Acute on chronic anemia   . Pain   . Agitation 07/26/2018  . Hepatitis C 07/26/2018  . Nontraumatic thalamic hemorrhage ([place]) 07/26/2018  . Osteoarthritis of left hip 04/06/2018  . Cameron ulcer, chronic 01/11/2018  . Symptomatic anemia 01/10/2018  . Syncope 01/10/2018  . Hiatal hernia with gastroesophageal reflux disease and esophagitis 01/05/2018  . Insomnia 01/05/2018  . Rotator cuff syndrome of left shoulder 11/21/2017  . Anemia, iron deficiency   . Gastrointestinal bleeding 10/24/2015  . Tobacco abuse   . Acute ischemic stroke ([place])   . Stroke ([place]) 05/31/2015  . HTN (hypertension) 04/10/2014  . Inguinal hernia without mention of obstruction or gangrene, unilateral or unspecified, (not specified as recurrent)-right 01/30/2014    Past Surgical History:  Procedure Laterality Date  . APPENDECTOMY    . BIOPSY  01/11/2018   Procedure: BIOPSY;  Surgeon: Gatha Mayer, MD;  Location: [facility] ENDOSCOPY;  Service: Endoscopy;;  . COLONOSCOPY    . COLONOSCOPY N/A 10/26/2015   Procedure: COLONOSCOPY;  Surgeon: Doran Stabler, MD;  Location: [facility] ENDOSCOPY;  Service: Endoscopy;  Laterality: N/A;  . ESOPHAGOGASTRODUODENOSCOPY    . ESOPHAGOGASTRODUODENOSCOPY N/A 10/26/2015   Procedure: ESOPHAGOGASTRODUODENOSCOPY (EGD);  Surgeon: Doran Stabler, MD;  Location: [facility] ENDOSCOPY;  Service: Endoscopy;  Laterality: N/A;  . ESOPHAGOGASTRODUODENOSCOPY (  EGD) WITH PROPOFOL N/A 01/11/2018   Procedure: ESOPHAGOGASTRODUODENOSCOPY (EGD) WITH PROPOFOL;  Surgeon: Gatha Mayer, MD;  Location: [facility] ENDOSCOPY;  Service: Endoscopy;  Laterality: N/A;  . INGUINAL HERNIA REPAIR Right 11/04/2014   Procedure: RIGHT INGUINAL HERNIA REPAIR WITH MESH;  Surgeon: Jackolyn Confer, MD;  Location: [facility];  Service: General;  Laterality: Right;  . MASS EXCISION N/A 11/04/2014   Procedure: REMOVAL OF RIGHT GROIN SOFT TISSUE MASS;  Surgeon: Jackolyn Confer, MD;  Location: [facility];  Service: General;   Laterality: N/A;  . Multiple abdominal surgeries     total of 13  . PROSTATECTOMY    . SMALL INTESTINE SURGERY         Family History  Problem Relation Age of Onset  . Alzheimer's disease Father   . Cancer Mother        Lymph node  . Heart failure Brother 45       Transplant 13 years ago  . CAD Brother   . Heart disease Sister 44       MI    Social History   Tobacco Use  . Smoking status: Current Every Day Smoker    Packs/day: 0.50    Years: 50.00    Pack years: 25.00    Types: Cigarettes  . Smokeless tobacco: Never Used  Vaping Use  . Vaping Use: Former  Substance Use Topics  . Alcohol use: Yes    Alcohol/week: 0.0 standard drinks    Comment: occasionally  . Drug use: No    Frequency: 5.0 times per week    Home Medications Prior to Admission medications   Medication Sig Start Date End Date Taking? Authorizing Provider  acetaminophen (TYLENOL) 325 MG tablet Take 2 tablets (650 mg total) by mouth 4 (four) times daily -  with meals and at bedtime. 08/18/18   Love, Ivan Anchors, PA-C  ALPRAZolam (XANAX) 0.25 MG tablet Take 1 tablet (0.25 mg total) by mouth daily as needed for anxiety. 04/12/19   Laurey Morale, MD  Blood Pressure Monitoring (BLOOD PRESSURE MONITOR/L CUFF) MISC Use as directed daily. 12/28/19   Billie Ruddy, MD  buPROPion (WELLBUTRIN SR) 200 MG 12 hr tablet TAKE 1 TABLET BY MOUTH DAILY Patient taking differently: Take 200 mg by mouth daily.  01/17/20   Meredith Staggers, MD  chlorthalidone (HYGROTON) 25 MG tablet Take 1 tablet (25 mg total) by mouth daily. 02/05/20   Billie Ruddy, MD  cyclobenzaprine (FLEXERIL) 5 MG tablet TAKE 1 TABLET BY MOUTH TWICE A DAY AS NEEDED FOR MUSCLE SPASMS Patient taking differently: Take 5 mg by mouth in the morning and at bedtime.  01/23/20   Meredith Staggers, MD  ferrous sulfate 325 (65 FE) MG tablet TAKE 1 TABLET BY MOUTH THREE TWICE A DAY WITH MEALS Patient taking differently: Take 325 mg by mouth 3 (three) times daily  with meals.  03/30/18   Billie Ruddy, MD  hydrALAZINE (APRESOLINE) 25 MG tablet Take 1 tablet (25 mg total) by mouth 3 (three) times daily. 12/10/19   Billie Ruddy, MD  Incontinence Supply Disposable (PROCARE ADULT BRIEFS LARGE) MISC As needed 11/21/19   Billie Ruddy, MD  lidocaine (LIDODERM) 5 % Place 1 patch onto the skin daily. Apply to left hip at 7 am and remove at 7 pm daily. Patient not taking: Reported on 02/12/2020 08/18/18   Love, Ivan Anchors, PA-C  lisinopril (ZESTRIL) 40 MG tablet Take 1 tablet (40 mg total) by mouth daily. 12/28/19  Billie Ruddy, MD  Menthol-Methyl Salicylate (MUSCLE RUB) 10-15 % CREA Apply 1 application topically 2 (two) times daily. To left shoulder and left hip Patient not taking: Reported on 02/12/2020 08/15/18   Love, Ivan Anchors, PA-C  nystatin cream (MYCOSTATIN) Apply 1 application topically 2 (two) times daily. Patient not taking: Reported on 02/12/2020 12/10/19   Billie Ruddy, MD  pantoprazole (PROTONIX) 40 MG tablet TAKE 1 TABLET BY MOUTH TWICE A DAY BEFORE A MEAL 06/22/19   Billie Ruddy, MD  potassium chloride SA (KLOR-CON) 20 MEQ tablet Take 1 tablet (20 mEq total) by mouth daily. 02/05/20   Billie Ruddy, MD  QUEtiapine (SEROQUEL) 200 MG tablet TAKE 1 TABLET BY MOUTH EVERY NIGHT AT BEDTIME Patient taking differently: Take 200 mg by mouth at bedtime.  01/17/20   Meredith Staggers, MD  senna-docusate (SENOKOT-S) 8.6-50 MG tablet Take 1 tablet by mouth 2 (two) times daily. Patient not taking: Reported on 02/12/2020 08/18/18   Love, Ivan Anchors, PA-C  silver sulfADIAZINE (SILVADENE) 1 % cream Apply 1 application topically 2 (two) times daily. Patient not taking: Reported on 02/12/2020 04/12/19   Laurey Morale, MD  sodium chloride (OCEAN) 0.65 % SOLN nasal spray Place 1 spray into both nostrils as needed for congestion. Patient not taking: Reported on 02/12/2020 08/18/18   Love, Ivan Anchors, PA-C  temazepam (RESTORIL) 30 MG capsule Take 1 capsule (30 mg total) by  mouth at bedtime as needed for sleep. 02/12/20   Laurey Morale, MD  traMADol (ULTRAM) 50 MG tablet Take 1 tablet (50 mg total) by mouth every 12 (twelve) hours as needed for severe pain (shouder pain). Patient not taking: Reported on 02/12/2020 04/12/19   Laurey Morale, MD  verapamil (CALAN-SR) 180 MG CR tablet Take 1 tablet (180 mg total) by mouth at bedtime. 02/12/20   Laurey Morale, MD    Allergies    Penicillins and Sudafed [pseudoephedrine]  Review of Systems   Review of Systems  Constitutional: Positive for diaphoresis and malaise/fatigue. Negative for chills and fever.  HENT: Negative for ear pain and sore throat.   Eyes: Negative for pain and visual disturbance.  Respiratory: Negative for cough and shortness of breath.   Cardiovascular: Positive for syncope. Negative for chest pain and palpitations.  Gastrointestinal: Negative for abdominal pain, nausea and vomiting.  Genitourinary: Negative for dysuria and hematuria.  Musculoskeletal: Negative for arthralgias and back pain.  Skin: Positive for wound. Negative for color change and rash.  Neurological: Positive for dizziness and headaches. Negative for focal weakness, seizures and syncope.  Psychiatric/Behavioral: Negative for confusion.  All other systems reviewed and are negative.   Physical Exam Updated Vital Signs  ED Triage Vitals  Enc Vitals Group     BP 02/12/20 1315 (!) 208/102     Pulse Rate 02/12/20 1315 76     Resp 02/12/20 1315 17     Temp 02/12/20 1315 98.8 F (37.1 C)     Temp Source 02/12/20 1315 Oral     SpO2 02/12/20 1315 100 %     Weight --      Height --      Head Circumference --      Peak Flow --      Pain Score 02/12/20 1316 6     Pain Loc --      Pain Edu? --      Excl. in [place]? --     Physical Exam Vitals and nursing note reviewed.  Constitutional:      General: He is in acute distress.     Appearance: He is well-developed. He is not ill-appearing.  HENT:     Head:     Comments:  Abrasion hematoma to the forehead    Nose: Nose normal.     Mouth/Throat:     Mouth: Mucous membranes are moist.  Eyes:     Extraocular Movements: Extraocular movements intact.     Conjunctiva/sclera: Conjunctivae normal.     Pupils: Pupils are equal, round, and reactive to light.  Cardiovascular:     Rate and Rhythm: Normal rate and regular rhythm.     Pulses: Normal pulses.     Heart sounds: Normal heart sounds. No murmur heard.   Pulmonary:     Effort: Pulmonary effort is normal. No respiratory distress.     Breath sounds: Normal breath sounds.  Abdominal:     General: Abdomen is flat. There is no distension.     Palpations: Abdomen is soft.     Tenderness: There is no abdominal tenderness.  Musculoskeletal:        General: No tenderness. Normal range of motion.     Cervical back: Normal range of motion and neck supple. No tenderness.  Skin:    General: Skin is warm and dry.  Neurological:     General: No focal deficit present.     Mental Status: He is alert. Mental status is at baseline.     Comments: 4+ out of 5 weakness in the left upper extremity otherwise 5+ out of 5 strength throughout, normal sensation, normal speech, no drift  Psychiatric:        Mood and Affect: Mood is anxious.     ED Results / Procedures / Treatments   Labs (all labs ordered are listed, but only abnormal results are displayed) Labs Reviewed  CBC WITH DIFFERENTIAL/PLATELET - Abnormal; Notable for the following components:      Result Value   RBC 4.05 (*)    Hemoglobin 7.9 (*)    HCT 29.9 (*)    MCV 73.8 (*)    MCH 19.5 (*)    MCHC 26.4 (*)    RDW 19.5 (*)    Platelets 445 (*)    All other components within normal limits  COMPREHENSIVE METABOLIC PANEL - Abnormal; Notable for the following components:   Glucose, Bld 110 (*)    BUN 7 (*)    Calcium 8.8 (*)    All other components within normal limits  URINALYSIS, ROUTINE W REFLEX MICROSCOPIC - Abnormal; Notable for the following  components:   Color, Urine STRAW (*)    Protein, ur 30 (*)    All other components within normal limits  URINE CULTURE  LIPASE, BLOOD  TROPONIN I (HIGH SENSITIVITY)  TROPONIN I (HIGH SENSITIVITY)    EKG EKG Interpretation  Date/Time:  Tuesday February 12 2020 13:16:52 EDT Ventricular Rate:  75 PR Interval:    QRS Duration: 111 QT Interval:  424 QTC Calculation: 474 R Axis:   10 Text Interpretation: Ectopic atrial rhythm Probable left atrial enlargement Abnormal R-wave progression, early transition Confirmed by Lennice Sites [phone]) on 02/12/2020 1:20:16 PM   Radiology DG Chest 2 View  Result Date: 02/12/2020 CLINICAL DATA:  Fall, syncope. Additional history provided: Patient with history of hypertension, smoking, stroke, GERD, hep C. EXAM: CHEST - 2 VIEW COMPARISON:  Prior chest radiographs 01/03/2017 and earlier FINDINGS: Heart size at the upper limits of normal, unchanged. No appreciable airspace consolidation  or pulmonary edema. No evidence of pleural effusion or pneumothorax. Redemonstrated hiatal hernia. No acute bony abnormality identified. IMPRESSION: No evidence of acute cardiopulmonary abnormality. Redemonstrated hiatal hernia. Electronically Signed   By: Kellie Simmering DO   On: 02/12/2020 14:10    Procedures Procedures (including critical care time)  Medications Ordered in ED Medications  sodium chloride 0.9 % bolus 500 mL (500 mLs Intravenous Bolus from Bag 02/12/20 1342)  fentaNYL (SUBLIMAZE) injection 50 mcg (50 mcg Intravenous Given 02/12/20 1341)  ondansetron (ZOFRAN) injection 4 mg (4 mg Intravenous Given 02/12/20 1342)    ED Course  I have reviewed the triage vital signs and the nursing notes.  Pertinent labs & imaging results that were available during my care of the patient were reviewed by me and considered in my medical decision making (see chart for details).    MDM Rules/Calculators/A&P                          [name] is a 69 year old male  with history of high cholesterol, hypertension, GI bleed, prostate cancer presents the ED after syncopal event.  Patient with high blood pressure but otherwise normal vitals.  EKG shows sinus rhythm.  No ischemic changes.  No chest pain.  Patient was at his doctor's office to be evaluated for his high blood pressure.  As he was walking out with his walker he passed out.  He does not describe much of a prodrome.  No seizure-like activity.  Blood sugar was normal with EMS.  He did hit his head and has hematoma and abrasion to his forehead.  He is not on blood thinners.  He denies any black stools or bloody stools.  No active chest pain.  Since the fall he has felt dizzy and has a headache.  States that he did not feel dizzy before fall.  Neurologically he appears intact.  He has some left upper arm weakness which he states is his baseline.  No visual field deficit.  Normal finger-to-nose finger.  Otherwise appears to be at his neurologic baseline.  Will get lab work including CT head and neck.  Urinalysis negative for infection.  No significant anemia, electrolyte abnormality, kidney injury.  Troponin normal.  Chest x-ray without signs of infection.  Patient handed off to oncoming ED staff with patient pending CT imaging.  Upon reevaluation patient still having some headache neck pain.  Discussed with him about need for possible admission given syncope.  He states that he feels like he wants to go home.  Overall patient was signed out and to be reevaluated.  This chart was dictated using voice recognition software.  Despite best efforts to proofread,  errors can occur which can change the documentation meaning.    Final Clinical Impression(s) / ED Diagnoses Final diagnoses:  Syncope and collapse    Rx / DC Orders ED Discharge Orders    None       Lennice Sites, DO 02/12/20 1534

## 2020-02-12 NOTE — Telephone Encounter (Signed)
Message sent to Dr Banks for review 

## 2020-02-12 NOTE — Telephone Encounter (Signed)
I am not taking new patients at this time

## 2020-02-12 NOTE — ED Triage Notes (Signed)
Patient arrives to ED with complaints of a syncopal episode while checking out at the MD's office today. Per EMS patient suddenly got dizzy, everything went black, and he passed out and fell to the ground. Patient hit his head on the floor. No blood thinners. Patient a&ox4.

## 2020-02-12 NOTE — Progress Notes (Signed)
   Subjective:    Patient ID: [name], male    DOB: [birth year], 69 y.o.   MRN: [id number]  HPI This is a patient of Dr. Volanda Napoleon who has a hx of stroke times 2 who is concerned about his high BP. He was here a few weeks ago for the BP and for swelling in both legs. He had labs drawn that were unremarkable and his creatinine was normal. He was taken off Amlodipine and was started on Chlorthalidine, but his BP at home remains elevated. He has been averaging systolic readings of 557D to 180s for the past month. He denies any headache or chest pain or SOB.    Review of Systems  Constitutional: Negative.   Respiratory: Negative.   Cardiovascular: Positive for leg swelling. Negative for chest pain and palpitations.       Objective:   Physical Exam Constitutional:      Comments: Walks with a walker   Cardiovascular:     Rate and Rhythm: Normal rate and regular rhythm.     Pulses: Normal pulses.     Heart sounds: Normal heart sounds.  Pulmonary:     Effort: Pulmonary effort is normal.     Breath sounds: Normal breath sounds.  Musculoskeletal:     Comments: Trace bilateral ankle edema   Neurological:     Mental Status: He is alert.           Assessment & Plan:  Poorly controlled HTN. We will add Verapamil SR 180 mg daily to his Chlorthalidone, Hydralazine,and Lisinopril. He is schedule to see the [place] clinic on 02-21-20, and they can follow up the BP at that time.  Alysia Penna, MD

## 2020-02-12 NOTE — Discharge Instructions (Addendum)
As we discussed admission was warranted for cardiac monitoring. Make an appointment to follow-up with your primary care doctor. Return for any new or worse symptoms.

## 2020-02-12 NOTE — ED Provider Notes (Signed)
Patient CT head neck without any acute findings. Patient still refusing admission for the syncopal episode. Explained to his daughter the concerns as well. And patient insists that he will not be admitted for cardiac monitoring. Patient informed the standard of care would warrant admission. Patient understands the risk. To include another syncopal episode or a significant cardiac event or even cardiac arrest. Resulting in death.  We will go ahead and discharge patient home patient will return for any new or worse symptoms.   Fredia Sorrow, MD 02/12/20 [phone]

## 2020-02-13 LAB — URINE CULTURE: Culture: <10000 — AB

## 2020-02-14 ENCOUNTER — Ambulatory Visit: Payer: Medicare HMO | Admitting: Family Medicine

## 2020-02-14 DIAGNOSIS — E782 Mixed hyperlipidemia: Secondary | ICD-10-CM | POA: Diagnosis not present

## 2020-02-14 DIAGNOSIS — M1990 Unspecified osteoarthritis, unspecified site: Secondary | ICD-10-CM | POA: Diagnosis not present

## 2020-02-14 DIAGNOSIS — I10 Essential (primary) hypertension: Secondary | ICD-10-CM | POA: Diagnosis not present

## 2020-02-14 DIAGNOSIS — H409 Unspecified glaucoma: Secondary | ICD-10-CM | POA: Diagnosis not present

## 2020-02-14 DIAGNOSIS — K219 Gastro-esophageal reflux disease without esophagitis: Secondary | ICD-10-CM | POA: Diagnosis not present

## 2020-02-14 DIAGNOSIS — G8929 Other chronic pain: Secondary | ICD-10-CM | POA: Diagnosis not present

## 2020-02-14 DIAGNOSIS — Z8673 Personal history of transient ischemic attack (TIA), and cerebral infarction without residual deficits: Secondary | ICD-10-CM | POA: Diagnosis not present

## 2020-02-15 ENCOUNTER — Ambulatory Visit (INDEPENDENT_AMBULATORY_CARE_PROVIDER_SITE_OTHER): Payer: Medicare HMO | Admitting: Family Medicine

## 2020-02-15 ENCOUNTER — Encounter: Payer: Self-pay | Admitting: Family Medicine

## 2020-02-15 ENCOUNTER — Other Ambulatory Visit: Payer: Self-pay

## 2020-02-15 VITALS — BP 126/60 | HR 69 | Temp 98.7°F | Wt 192.0 lb

## 2020-02-15 DIAGNOSIS — I10 Essential (primary) hypertension: Secondary | ICD-10-CM | POA: Diagnosis not present

## 2020-02-15 DIAGNOSIS — Z8673 Personal history of transient ischemic attack (TIA), and cerebral infarction without residual deficits: Secondary | ICD-10-CM

## 2020-02-15 DIAGNOSIS — F0781 Postconcussional syndrome: Secondary | ICD-10-CM

## 2020-02-15 NOTE — Patient Instructions (Signed)
Managing Your Hypertension Hypertension is commonly called high blood pressure. This is when the force of your blood pressing against the walls of your arteries is too strong. Arteries are blood vessels that carry blood from your heart throughout your body. Hypertension forces the heart to work harder to pump blood, and may cause the arteries to become narrow or stiff. Having untreated or uncontrolled hypertension can cause heart attack, stroke, kidney disease, and other problems. What are blood pressure readings? A blood pressure reading consists of a higher number over a lower number. Ideally, your blood pressure should be below 120/80. The first ("top") number is called the systolic pressure. It is a measure of the pressure in your arteries as your heart beats. The second ("bottom") number is called the diastolic pressure. It is a measure of the pressure in your arteries as the heart relaxes. What does my blood pressure reading mean? Blood pressure is classified into four stages. Based on your blood pressure reading, your health care provider may use the following stages to determine what type of treatment you need, if any. Systolic pressure and diastolic pressure are measured in a unit called mm Hg. Normal  Systolic pressure: below 120.  Diastolic pressure: below 80. Elevated  Systolic pressure: 120-129.  Diastolic pressure: below 80. Hypertension stage 1  Systolic pressure: 130-139.  Diastolic pressure: 80-89. Hypertension stage 2  Systolic pressure: 140 or above.  Diastolic pressure: 90 or above. What health risks are associated with hypertension? Managing your hypertension is an important responsibility. Uncontrolled hypertension can lead to:  A heart attack.  A stroke.  A weakened blood vessel (aneurysm).  Heart failure.  Kidney damage.  Eye damage.  Metabolic syndrome.  Memory and concentration problems. What changes can I make to manage my  hypertension? Hypertension can be managed by making lifestyle changes and possibly by taking medicines. Your health care provider will help you make a plan to bring your blood pressure within a normal range. Eating and drinking   Eat a diet that is high in fiber and potassium, and low in salt (sodium), added sugar, and fat. An example eating plan is called the DASH (Dietary Approaches to Stop Hypertension) diet. To eat this way: ? Eat plenty of fresh fruits and vegetables. Try to fill half of your plate at each meal with fruits and vegetables. ? Eat whole grains, such as whole wheat pasta, brown rice, or whole grain bread. Fill about one quarter of your plate with whole grains. ? Eat low-fat diary products. ? Avoid fatty cuts of meat, processed or cured meats, and poultry with skin. Fill about one quarter of your plate with lean proteins such as fish, chicken without skin, beans, eggs, and tofu. ? Avoid premade and processed foods. These tend to be higher in sodium, added sugar, and fat.  Reduce your daily sodium intake. Most people with hypertension should eat less than 1,500 mg of sodium a day.  Limit alcohol intake to no more than 1 drink a day for nonpregnant women and 2 drinks a day for men. One drink equals 12 oz of beer, 5 oz of wine, or 1 oz of hard liquor. Lifestyle  Work with your health care provider to maintain a healthy body weight, or to lose weight. Ask what an ideal weight is for you.  Get at least 30 minutes of exercise that causes your heart to beat faster (aerobic exercise) most days of the week. Activities may include walking, swimming, or biking.  Include exercise   to strengthen your muscles (resistance exercise), such as weight lifting, as part of your weekly exercise routine. Try to do these types of exercises for 30 minutes at least 3 days a week.  Do not use any products that contain nicotine or tobacco, such as cigarettes and e-cigarettes. If you need help quitting,  ask your health care provider.  Control any long-term (chronic) conditions you have, such as high cholesterol or diabetes. Monitoring  Monitor your blood pressure at home as told by your health care provider. Your personal target blood pressure may vary depending on your medical conditions, your age, and other factors.  Have your blood pressure checked regularly, as often as told by your health care provider. Working with your health care provider  Review all the medicines you take with your health care provider because there may be side effects or interactions.  Talk with your health care provider about your diet, exercise habits, and other lifestyle factors that may be contributing to hypertension.  Visit your health care provider regularly. Your health care provider can help you create and adjust your plan for managing hypertension. Will I need medicine to control my blood pressure? Your health care provider may prescribe medicine if lifestyle changes are not enough to get your blood pressure under control, and if:  Your systolic blood pressure is 130 or higher.  Your diastolic blood pressure is 80 or higher. Take medicines only as told by your health care provider. Follow the directions carefully. Blood pressure medicines must be taken as prescribed. The medicine does not work as well when you skip doses. Skipping doses also puts you at risk for problems. Contact a health care provider if:  You think you are having a reaction to medicines you have taken.  You have repeated (recurrent) headaches.  You feel dizzy.  You have swelling in your ankles.  You have trouble with your vision. Get help right away if:  You develop a severe headache or confusion.  You have unusual weakness or numbness, or you feel faint.  You have severe pain in your chest or abdomen.  You vomit repeatedly.  You have trouble breathing. Summary  Hypertension is when the force of blood pumping  through your arteries is too strong. If this condition is not controlled, it may put you at risk for serious complications.  Your personal target blood pressure may vary depending on your medical conditions, your age, and other factors. For most people, a normal blood pressure is less than 120/80.  Hypertension is managed by lifestyle changes, medicines, or both. Lifestyle changes include weight loss, eating a healthy, low-sodium diet, exercising more, and limiting alcohol. This information is not intended to replace advice given to you by your health care provider. Make sure you discuss any questions you have with your health care provider. Document Revised: 10/27/2018 Document Reviewed: 06/02/2016 Elsevier Patient Education  [place].  Post-Concussion Syndrome  Post-concussion syndrome is when symptoms last longer than normal after a head injury. What are the signs or symptoms? After a head injury, you may:  Have headaches.  Feel tired.  Feel dizzy.  Feel weak.  Have trouble seeing.  Have trouble in bright lights.  Have trouble hearing.  Not be able to remember things.  Not be able to focus.  Have trouble sleeping.  Have mood swings.  Have trouble learning new things. These can last from weeks to months. Follow these instructions at home: Medicines  Take all medicines only as told by  your doctor.  Do not take prescription pain medicines. Activity  Limit activities as told by your doctor. This includes: ? Homework. ? Job-related work. ? Thinking. ? Watching TV. ? Using a computer or phone. ? Puzzles. ? Exercise. ? Sports.  Slowly return to your normal activity as told by your doctor.  Stop an activity if you have symptoms.  Do not do anything that may cause you to get injured again. General instructions  Rest. Try to: ? Sleep 7-9 hours each night. ? Take naps or breaks when you feel tired during the day.  Do not drink alcohol until your  doctor says that you can.  Keep track of your symptoms.  Keep all follow-up visits as told by your doctor. This is important. Contact a doctor if:  You do not improve.  You get worse.  You have another injury. Get help right away if:  You have a very bad headache.  You feel confused.  You feel very sleepy.  You pass out (faint).  You throw up (vomit).  You feel weak in any part of your body.  You feel numb in any part of your body.  You start shaking (have a seizure).  You have trouble talking. Summary  Post-concussion syndrome is when symptoms last longer than normal after a head injury.  Limit all activity after your injury. Gradually return to normal activity as told by your doctor.  Rest, do not drink alcohol, and avoid prescription pain medicines after a concussion.  Call your doctor if your symptoms get worse. This information is not intended to replace advice given to you by your health care provider. Make sure you discuss any questions you have with your health care provider. Document Revised: 04/27/2018 Document Reviewed: 08/09/2017 Elsevier Patient Education  2020 [employer].

## 2020-02-15 NOTE — Progress Notes (Signed)
Subjective:    Patient ID: [name], male    DOB: 1950-10-19, 69 y.o.   MRN: [id number]  No chief complaint on file.   HPI Patient was seen today for follow-up on HTN. Pt seen 02/12/20 in clinic for elevated BP, when he hit his head after a syncopal episode.  Pt sent to ED via EMS after endorsing dizziness and having emesis.  In ED, CT head and neck without acute findings.  Pt advised to stay in hospital for monitoring, but he declined.  Since syncopal episode, pt endorses HA, neck soreness, dizziness, insomnia, mental fog.  BP has improved, 135/63 at home.  B/l LE edema also improved since d/c Norvasc 10 mg and starting chlorthalidone 25 mg and verapamil 180 mg.  HH contact pt in regards to an aide.  Pt notes he just needs to pick a person.  Pt inquires about restarting outpatient PT.  States they need a letter saying he can come back.   Past Medical History:  Diagnosis Date  . Arthritis   . [place] ulcer, chronic 01/11/2018  . Chronic back pain   . GERD (gastroesophageal reflux disease)    uses Baking Soda  . GIB (gastrointestinal bleeding) 2012  . Glaucoma    right eye  . Headache    occasionally  . Hepatitis C   . Hiatal hernia with gastroesophageal reflux disease and esophagitis 01/05/2018  . History of blood transfusion    no abnormal reaction noted  . History of gout    doesn't take any meds  . Hyperlipidemia    not on any meds  . Hypertension    takes Amlodipine and Lisinopril daily  . Insomnia    takes Ambien nightly  . Ischemic colitis ([name]) 2012  . Joint pain   . Nocturia   . Numbness    both legs occasionally  . PONV (postoperative nausea and vomiting)   . Prostate cancer ([place])   . Shortness of breath dyspnea    rarely but when notices he can be lying/sitting/exertion.Dr.Hochrein is aware per pt  . Stroke ([place]) 2016   . Urinary frequency   . Urinary urgency     Allergies  Allergen Reactions  . Penicillins Anaphylaxis    Has patient had a PCN  reaction causing immediate rash, facial/tongue/throat swelling, SOB or lightheadedness with hypotension: Yes Has patient had a PCN reaction causing severe rash involving mucus membranes or skin necrosis: No Has patient had a PCN reaction that required hospitalization: No Has patient had a PCN reaction occurring within the last 10 years: No If all of the above answers are "NO", then may proceed with Cephalosporin use..  . Sudafed [Pseudoephedrine] Other (See Comments)    Heart "races"    ROS General: Denies fever, chills, night sweats, changes in weight, changes in appetite +mental fog, dizziness, insomnia HEENT: Denies ear pain, changes in vision, rhinorrhea, sore throat  +HA CV: Denies CP, palpitations, SOB, orthopnea Pulm: Denies SOB, cough, wheezing GI: Denies abdominal pain, nausea, vomiting, diarrhea, constipation GU: Denies dysuria, hematuria, frequency, vaginal discharge Msk: Denies muscle cramps, joint pains  +neck pain Neuro: Denies weakness, numbness, tingling Skin: Denies rashes, bruising Psych: Denies depression, anxiety, hallucinations     Objective:    Blood pressure (!) 126/60, pulse 69, temperature 98.7 F (37.1 C), temperature source Oral, weight 192 lb (87.1 kg), SpO2 97 %. BP recheck 135/63  Gen. Pleasant, well-nourished, in no distress, normal affect   HEENT: [place]/AT, face symmetric, conjunctiva clear, no scleral  icterus, PERRLA, EOMI, nares patent without drainage Lungs: no accessory muscle use, CTAB, no wheezes or rales Cardiovascular: RRR, no m/r/g, no peripheral edema Musculoskeletal: No deformities, no cyanosis or clubbing, normal tone Neuro:  A&Ox3, CN II-XII intact, gait not assessed, sitting in transport wheelchair Skin:  Warm, no lesions/ rash   Wt Readings from Last 3 Encounters:  02/12/20 194 lb 9.6 oz (88.3 kg)  01/31/20 196 lb (88.9 kg)  01/01/20 195 lb (88.5 kg)    Lab Results  Component Value Date   WBC 6.7 02/12/2020   HGB 7.9 (L)  02/12/2020   HCT 29.9 (L) 02/12/2020   PLT 445 (H) 02/12/2020   GLUCOSE 110 (H) 02/12/2020   CHOL 102 07/22/2018   TRIG 108 07/22/2018   HDL 35 (L) 07/22/2018   LDLCALC 45 07/22/2018   ALT 24 02/12/2020   AST 23 02/12/2020   NA 138 02/12/2020   K 3.5 02/12/2020   CL 109 02/12/2020   CREATININE 0.76 02/12/2020   BUN 7 (L) 02/12/2020   CO2 22 02/12/2020   TSH 0.36 (L) 01/31/2020   INR 1.23 07/20/2018   HGBA1C 4.7 (L) 07/22/2018    Assessment/Plan:  Essential hypertension -Controlled -Continue lifestyle modifications -Continue current medications including chlorthalidone 25 mg, hydralazine 25 mg 3 times daily, lisinopril 40 mg, verapamil 180 mg, K. Dur 20 mEq -Patient encouraged to continue checking BP at home and keep a log to bring to the clinic -Pt informed clinic contacted physical therapy at time of last OFV for pt to return.  Per phone note they are aware and will contact patient in regards to scheduling the appointment  Post concussion syndrome -Discussed symptoms likely caused by concussion from fall. -Discussed mental rest -Given handout -Given precautions -Continue to monitor  History of CVA -Continue blood pressure control -Continue to monitor cholesterol.  Typically well controlled.  F/u in 1 month, sooner if needed.  [name], MD

## 2020-02-18 ENCOUNTER — Encounter: Payer: Self-pay | Admitting: Family Medicine

## 2020-02-18 ENCOUNTER — Other Ambulatory Visit: Payer: Self-pay | Admitting: Family Medicine

## 2020-02-18 DIAGNOSIS — I10 Essential (primary) hypertension: Secondary | ICD-10-CM

## 2020-02-20 ENCOUNTER — Ambulatory Visit: Payer: Medicare HMO | Admitting: Family Medicine

## 2020-02-20 ENCOUNTER — Telehealth: Payer: Self-pay | Admitting: Family Medicine

## 2020-02-20 DIAGNOSIS — I10 Essential (primary) hypertension: Secondary | ICD-10-CM | POA: Diagnosis not present

## 2020-02-20 DIAGNOSIS — E782 Mixed hyperlipidemia: Secondary | ICD-10-CM | POA: Diagnosis not present

## 2020-02-20 DIAGNOSIS — H409 Unspecified glaucoma: Secondary | ICD-10-CM | POA: Diagnosis not present

## 2020-02-20 DIAGNOSIS — K219 Gastro-esophageal reflux disease without esophagitis: Secondary | ICD-10-CM | POA: Diagnosis not present

## 2020-02-20 DIAGNOSIS — M1990 Unspecified osteoarthritis, unspecified site: Secondary | ICD-10-CM | POA: Diagnosis not present

## 2020-02-20 DIAGNOSIS — Z8673 Personal history of transient ischemic attack (TIA), and cerebral infarction without residual deficits: Secondary | ICD-10-CM | POA: Diagnosis not present

## 2020-02-20 DIAGNOSIS — G8929 Other chronic pain: Secondary | ICD-10-CM | POA: Diagnosis not present

## 2020-02-20 NOTE — Telephone Encounter (Signed)
Please advise if ok for verbal orders and pt BP

## 2020-02-20 NOTE — Telephone Encounter (Signed)
Disregard

## 2020-02-20 NOTE — Telephone Encounter (Signed)
Radovan w/[facility] is at the pt house and stated that he took the pts Bp upon his arrival and it was 181/83 he had pt to take his medication and after 30 min took the Bp again and it is reading 164/83.  Radovan would like for the pt to be called with recommendation.  Radovan would like to have verbal orders for OT for 1 week 5 beginning next Monday 02/25/2020.  If he should not answer you can leave a msg on his secure voice mail.

## 2020-02-20 NOTE — Telephone Encounter (Signed)
Okay from below.  Would advise patient to take his medications.

## 2020-02-21 NOTE — Telephone Encounter (Signed)
Charisse from [facility] stated the pt missed his appt and will not answer nor return her phone calls. They are discharging him from [place] can be reached at [phone] to leave a VM

## 2020-02-21 NOTE — Telephone Encounter (Signed)
Spoke with the pt and informed him the orders were approved, per PCP he should take his medications and the pt agreed.

## 2020-02-21 NOTE — Telephone Encounter (Signed)
Spoke with [place], [place] [phone]) and informed him of the message and orders as below.

## 2020-02-22 NOTE — Telephone Encounter (Signed)
Spoke with pt state that he is still having therapy and that the went out to pt house yesterday

## 2020-02-25 ENCOUNTER — Telehealth: Payer: Self-pay | Admitting: Family Medicine

## 2020-02-25 NOTE — Telephone Encounter (Signed)
[name] is calling to see if they can get new verbal orders for PT and the nurse due to the pt did not answer when the called and went out.  A msg can be left on the secured voice mail.

## 2020-02-27 ENCOUNTER — Telehealth: Payer: Self-pay | Admitting: Family Medicine

## 2020-02-27 DIAGNOSIS — Z9181 History of falling: Secondary | ICD-10-CM | POA: Diagnosis not present

## 2020-02-27 DIAGNOSIS — M1990 Unspecified osteoarthritis, unspecified site: Secondary | ICD-10-CM | POA: Diagnosis not present

## 2020-02-27 DIAGNOSIS — H409 Unspecified glaucoma: Secondary | ICD-10-CM | POA: Diagnosis not present

## 2020-02-27 DIAGNOSIS — Z8673 Personal history of transient ischemic attack (TIA), and cerebral infarction without residual deficits: Secondary | ICD-10-CM | POA: Diagnosis not present

## 2020-02-27 DIAGNOSIS — I10 Essential (primary) hypertension: Secondary | ICD-10-CM | POA: Diagnosis not present

## 2020-02-27 DIAGNOSIS — E782 Mixed hyperlipidemia: Secondary | ICD-10-CM | POA: Diagnosis not present

## 2020-02-27 DIAGNOSIS — G8929 Other chronic pain: Secondary | ICD-10-CM | POA: Diagnosis not present

## 2020-02-27 DIAGNOSIS — K219 Gastro-esophageal reflux disease without esophagitis: Secondary | ICD-10-CM | POA: Diagnosis not present

## 2020-02-27 NOTE — Telephone Encounter (Signed)
Need verbal orders for physical therapy  1 x 5 weeks starting today  Charisse with [facility] [phone]

## 2020-02-27 NOTE — Telephone Encounter (Signed)
Spoke with Charisse with [facility] regarding pt approved verbal orders for Physical Therapy

## 2020-02-27 NOTE — Telephone Encounter (Signed)
Spoke with [facility] gave verbal orders for pt PT/OT since pt did not answer his phone for the therapist last week

## 2020-02-28 DIAGNOSIS — H409 Unspecified glaucoma: Secondary | ICD-10-CM | POA: Diagnosis not present

## 2020-02-28 DIAGNOSIS — E782 Mixed hyperlipidemia: Secondary | ICD-10-CM | POA: Diagnosis not present

## 2020-02-28 DIAGNOSIS — M1990 Unspecified osteoarthritis, unspecified site: Secondary | ICD-10-CM | POA: Diagnosis not present

## 2020-02-28 DIAGNOSIS — Z9181 History of falling: Secondary | ICD-10-CM | POA: Diagnosis not present

## 2020-02-28 DIAGNOSIS — G8929 Other chronic pain: Secondary | ICD-10-CM | POA: Diagnosis not present

## 2020-02-28 DIAGNOSIS — I10 Essential (primary) hypertension: Secondary | ICD-10-CM | POA: Diagnosis not present

## 2020-02-28 DIAGNOSIS — Z8673 Personal history of transient ischemic attack (TIA), and cerebral infarction without residual deficits: Secondary | ICD-10-CM | POA: Diagnosis not present

## 2020-02-28 DIAGNOSIS — K219 Gastro-esophageal reflux disease without esophagitis: Secondary | ICD-10-CM | POA: Diagnosis not present

## 2020-03-03 NOTE — Telephone Encounter (Signed)
This encounter was resolved

## 2020-03-05 ENCOUNTER — Other Ambulatory Visit: Payer: Self-pay

## 2020-03-05 ENCOUNTER — Encounter: Payer: Self-pay | Admitting: Physical Medicine & Rehabilitation

## 2020-03-05 ENCOUNTER — Encounter: Payer: Medicare HMO | Attending: Physical Medicine & Rehabilitation | Admitting: Physical Medicine & Rehabilitation

## 2020-03-05 VITALS — BP 162/81 | HR 72 | Temp 98.3°F | Ht 69.0 in | Wt 192.0 lb

## 2020-03-05 DIAGNOSIS — K219 Gastro-esophageal reflux disease without esophagitis: Secondary | ICD-10-CM | POA: Diagnosis not present

## 2020-03-05 DIAGNOSIS — F5101 Primary insomnia: Secondary | ICD-10-CM | POA: Diagnosis not present

## 2020-03-05 DIAGNOSIS — H409 Unspecified glaucoma: Secondary | ICD-10-CM | POA: Diagnosis not present

## 2020-03-05 DIAGNOSIS — E782 Mixed hyperlipidemia: Secondary | ICD-10-CM | POA: Diagnosis not present

## 2020-03-05 DIAGNOSIS — F329 Major depressive disorder, single episode, unspecified: Secondary | ICD-10-CM | POA: Insufficient documentation

## 2020-03-05 DIAGNOSIS — Z8673 Personal history of transient ischemic attack (TIA), and cerebral infarction without residual deficits: Secondary | ICD-10-CM | POA: Diagnosis not present

## 2020-03-05 DIAGNOSIS — F419 Anxiety disorder, unspecified: Secondary | ICD-10-CM | POA: Insufficient documentation

## 2020-03-05 DIAGNOSIS — M75102 Unspecified rotator cuff tear or rupture of left shoulder, not specified as traumatic: Secondary | ICD-10-CM | POA: Diagnosis not present

## 2020-03-05 DIAGNOSIS — R6889 Other general symptoms and signs: Secondary | ICD-10-CM | POA: Diagnosis not present

## 2020-03-05 DIAGNOSIS — I619 Nontraumatic intracerebral hemorrhage, unspecified: Secondary | ICD-10-CM | POA: Diagnosis not present

## 2020-03-05 DIAGNOSIS — I10 Essential (primary) hypertension: Secondary | ICD-10-CM | POA: Diagnosis not present

## 2020-03-05 DIAGNOSIS — R55 Syncope and collapse: Secondary | ICD-10-CM | POA: Diagnosis not present

## 2020-03-05 DIAGNOSIS — G8929 Other chronic pain: Secondary | ICD-10-CM | POA: Diagnosis not present

## 2020-03-05 DIAGNOSIS — M1990 Unspecified osteoarthritis, unspecified site: Secondary | ICD-10-CM | POA: Diagnosis not present

## 2020-03-05 DIAGNOSIS — Z9181 History of falling: Secondary | ICD-10-CM | POA: Diagnosis not present

## 2020-03-05 NOTE — Patient Instructions (Signed)
PLEASE FOLLOW UP WITH YOUR PRIMARY DOCTOR ABOUT YOUR PASSING OUT:   MIGHT NEED TO SEE CARDIOLOGY FOR HEART MONITORING  FOLLOW UP ON YOUR LOW HEMOGLOBIN

## 2020-03-05 NOTE — Progress Notes (Signed)
Subjective:    Patient ID: [name], male    DOB: 11/26/1950, 69 y.o.   MRN: [id number]  HPI   [name] is here in follow up of his left thalamic hemorrhage. He tells me he blacked out his doctor's office 3 weeks ago. xrays were negative. He suffered a contusion to his forehead. ekg was nsr. hgb was 7.9. He refused admission for work up. He denies any further episodes.     Otherwise he has been doing ok. His sleep has been fair. Left shoulder is still sore. He never went for MRI   Pain Inventory Average Pain 9 Pain Right Now 8 My pain is constant and aching  In the last 24 hours, has pain interfered with the following? General activity 10 Relation with others 0 Enjoyment of life 9 What TIME of day is your pain at its worst? varies Sleep (in general) Poor  Pain is worse with: inactivity Pain improves with: medication Relief from Meds: 7  Family History  Problem Relation Age of Onset  . Alzheimer's disease Father   . Cancer Mother        Lymph node  . Heart failure Brother 45       Transplant 13 years ago  . CAD Brother   . Heart disease Sister 89       MI   Social History   Socioeconomic History  . Marital status: Legally Separated    Spouse name: Not on file  . Number of children: 2  . Years of education: Not on file  . Highest education level: Not on file  Occupational History  . Not on file  Tobacco Use  . Smoking status: Current Every Day Smoker    Packs/day: 0.50    Years: 50.00    Pack years: 25.00    Types: Cigarettes  . Smokeless tobacco: Never Used  Vaping Use  . Vaping Use: Former  Substance and Sexual Activity  . Alcohol use: Yes    Alcohol/week: 0.0 standard drinks    Comment: occasionally  . Drug use: No    Frequency: 5.0 times per week  . Sexual activity: Yes  Other Topics Concern  . Not on file  Social History Narrative   One living child .  One murdered.  Lives with girlfriend.     Social Determinants of Health    Financial Resource Strain:   . Difficulty of Paying Living Expenses:   Food Insecurity:   . Worried About Charity fundraiser in the Last Year:   . Arboriculturist in the Last Year:   Transportation Needs:   . Film/video editor (Medical):   Marland Kitchen Lack of Transportation (Non-Medical):   Physical Activity:   . Days of Exercise per Week:   . Minutes of Exercise per Session:   Stress:   . Feeling of Stress :   Social Connections:   . Frequency of Communication with Friends and Family:   . Frequency of Social Gatherings with Friends and Family:   . Attends Religious Services:   . Active Member of Clubs or Organizations:   . Attends Archivist Meetings:   Marland Kitchen Marital Status:    Past Surgical History:  Procedure Laterality Date  . APPENDECTOMY    . BIOPSY  01/11/2018   Procedure: BIOPSY;  Surgeon: Gatha Mayer, MD;  Location: [facility] ENDOSCOPY;  Service: Endoscopy;;  . COLONOSCOPY    . COLONOSCOPY N/A 10/26/2015   Procedure: COLONOSCOPY;  Surgeon:  Doran Stabler, MD;  Location[facility]COPY;  Service: Endoscopy;  Laterality: N/A;  . ESOPHAGOGASTRODUODENOSCOPY    . ESOPHAGOGASTRODUODENOSCOPY N/A 10/26/2015   Procedure: ESOPHAGOGASTRODUODENOSCOPY (EGD);  Surgeon: Doran Stabler, MD;  Location: [facility] ENDOSCOPY;  Service: Endoscopy;  Laterality: N/A;  . ESOPHAGOGASTRODUODENOSCOPY (EGD) WITH PROPOFOL N/A 01/11/2018   Procedure: ESOPHAGOGASTRODUODENOSCOPY (EGD) WITH PROPOFOL;  Surgeon: Gatha Mayer, MD;  Location: [facility] ENDOSCOPY;  Service: Endoscopy;  Laterality: N/A;  . INGUINAL HERNIA REPAIR Right 11/04/2014   Procedure: RIGHT INGUINAL HERNIA REPAIR WITH MESH;  Surgeon: Jackolyn Confer, MD;  Location: [place];  Service: General;  Laterality: Right;  . MASS EXCISION N/A 11/04/2014   Procedure: REMOVAL OF RIGHT GROIN SOFT TISSUE MASS;  Surgeon: Jackolyn Confer, MD;  Location: [place];  Service: General;  Laterality: N/A;  . Multiple abdominal surgeries     total of 13  . PROSTATECTOMY     . SMALL INTESTINE SURGERY     Past Surgical History:  Procedure Laterality Date  . APPENDECTOMY    . BIOPSY  01/11/2018   Procedure: BIOPSY;  Surgeon: Gatha Mayer, MD;  Location: [facility];  Service: Endoscopy;;  . COLONOSCOPY    . COLONOSCOPY N/A 10/26/2015   Procedure: COLONOSCOPY;  Surgeon: Doran Stabler, MD;  Location: [facility] ENDOSCOPY;  Service: Endoscopy;  Laterality: N/A;  . ESOPHAGOGASTRODUODENOSCOPY    . ESOPHAGOGASTRODUODENOSCOPY N/A 10/26/2015   Procedure: ESOPHAGOGASTRODUODENOSCOPY (EGD);  Surgeon: Doran Stabler, MD;  Location: [facility] ENDOSCOPY;  Service: Endoscopy;  Laterality: N/A;  . ESOPHAGOGASTRODUODENOSCOPY (EGD) WITH PROPOFOL N/A 01/11/2018   Procedure: ESOPHAGOGASTRODUODENOSCOPY (EGD) WITH PROPOFOL;  Surgeon: Gatha Mayer, MD;  Location: [facility] ENDOSCOPY;  Service: Endoscopy;  Laterality: N/A;  . INGUINAL HERNIA REPAIR Right 11/04/2014   Procedure: RIGHT INGUINAL HERNIA REPAIR WITH MESH;  Surgeon: Jackolyn Confer, MD;  Location: [place];  Service: General;  Laterality: Right;  . MASS EXCISION N/A 11/04/2014   Procedure: REMOVAL OF RIGHT GROIN SOFT TISSUE MASS;  Surgeon: Jackolyn Confer, MD;  Location: [place];  Service: General;  Laterality: N/A;  . Multiple abdominal surgeries     total of 13  . PROSTATECTOMY    . SMALL INTESTINE SURGERY     Past Medical History:  Diagnosis Date  . Arthritis   . [place] ulcer, chronic 01/11/2018  . Chronic back pain   . GERD (gastroesophageal reflux disease)    uses Baking Soda  . GIB (gastrointestinal bleeding) 2012  . Glaucoma    right eye  . Headache    occasionally  . Hepatitis C   . Hiatal hernia with gastroesophageal reflux disease and esophagitis 01/05/2018  . History of blood transfusion    no abnormal reaction noted  . History of gout    doesn't take any meds  . Hyperlipidemia    not on any meds  . Hypertension    takes Amlodipine and Lisinopril daily  . Insomnia    takes Ambien nightly  . Ischemic colitis ([place])  2012  . Joint pain   . Nocturia   . Numbness    both legs occasionally  . PONV (postoperative nausea and vomiting)   . Prostate cancer ([place])   . Shortness of breath dyspnea    rarely but when notices he can be lying/sitting/exertion.Dr.Hochrein is aware per pt  . Stroke ([place]) 2016   . Urinary frequency   . Urinary urgency    BP (!) 162/81   Pulse 72   Temp 98.3 F (36.8  C)   Ht 5\' 9"  (1.753 m)   Wt 192 lb (87.1 kg) Comment: per pt  SpO2 98%   BMI 28.35 kg/m   Opioid Risk Score:   Fall Risk Score:  `1  Depression screen PHQ 2/9  Depression screen [facility] 2/9 12/25/2019 09/19/2019 09/19/2019 10/31/2018 10/18/2017 08/29/2017 07/11/2017  Decreased Interest - 1 1 0 0 1 0  Down, Depressed, Hopeless 1 2 2  0 0 1 0  PHQ - 2 Score 1 3 3  0 0 2 0  Altered sleeping 3 3 3  - - 3 -  Tired, decreased energy 0 2 2 - - 2 -  Change in appetite 3 3 3  - - 3 -  Feeling bad or failure about yourself  1 1 1  - - 1 -  Trouble concentrating 1 1 1  - - 3 -  Moving slowly or fidgety/restless 0 1 1 - - 3 -  Suicidal thoughts 0 0 0 - - 0 -  PHQ-9 Score 9 14 14  - - 17 -  Difficult doing work/chores - Somewhat difficult Somewhat difficult - - - -  Some recent data might be hidden   Review of Systems  Constitutional: Negative.   HENT: Negative.   Eyes: Negative.   Respiratory: Negative.   Cardiovascular: Negative.   Gastrointestinal: Negative.   Endocrine: Negative.   Genitourinary: Negative.   Musculoskeletal: Positive for gait problem.  Skin: Negative.   Allergic/Immunologic: Negative.   Hematological: Negative.   Psychiatric/Behavioral: Negative.   All other systems reviewed and are negative.      Objective:   Physical Exam   General: No acute distress HEENT: EOMI, oral membranes moist Cards: reg rate  Chest: normal effort Abdomen: Soft, NT, ND Skin: dry, intact Extremities: no edema Neuro: Pt is cognitively appropriate with normal insight, memory, and awareness. Cranial nerves 2-12 are  intact. Sensory exam is normal. Reflexes are 2+ in all 4's. Fine motor coordination is intact. No tremors.  Motor functions 5 out of 5 in the upper extremities he remains  4/5 left lower extremity and 3-4 out of 5 right lower extremity grossly..  Musculoskeletal:  left shoulder tender with IR/ER and impingement maneuve.    Psych: Pt's affect is appropriate. Pt is cooperative           Assessment & Plan:  1.  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             -HEP for now 2.   History of chronic back pain/headaches/left shoulder painpain Management: Tylenol as needed for now-  -NEEDS MRI of left shoulder scheduled still. 3. Mood: Wellbutrin  changed to XR form 4. HTN:  Continue amlodipine and lisinopril--as well as hydralazine--  -f/u with    5.  Acute on chronic blood loss anemia/HH with gastritis:              per outpt GI/primary            -hgb was 7.9 at ED -f/u with primary   6. Chronic Insomnia :overall better             -continue Seroquel at 200 mg at bedtime             -melatonin OTC would also be helpful.  We discussed this             -recommend now cigarettes after 8pm             -Continue Wellbutrin  in the morning 7.  Tobacco dependence:              - continue Wellbutrin  100 mg twice daily 8. Syncope:  -recommend discussing with PCP  -certainly hgb plays a role. May need to see cards for outpt heart monitoring.    15 minutes of face to face patient care time were spent during this visit. All questions were encouraged and answered.  Follow up with me in 4 months

## 2020-03-11 DIAGNOSIS — G8929 Other chronic pain: Secondary | ICD-10-CM | POA: Diagnosis not present

## 2020-03-11 DIAGNOSIS — Z8673 Personal history of transient ischemic attack (TIA), and cerebral infarction without residual deficits: Secondary | ICD-10-CM | POA: Diagnosis not present

## 2020-03-11 DIAGNOSIS — E782 Mixed hyperlipidemia: Secondary | ICD-10-CM | POA: Diagnosis not present

## 2020-03-11 DIAGNOSIS — M1990 Unspecified osteoarthritis, unspecified site: Secondary | ICD-10-CM | POA: Diagnosis not present

## 2020-03-11 DIAGNOSIS — K219 Gastro-esophageal reflux disease without esophagitis: Secondary | ICD-10-CM | POA: Diagnosis not present

## 2020-03-11 DIAGNOSIS — Z9181 History of falling: Secondary | ICD-10-CM | POA: Diagnosis not present

## 2020-03-11 DIAGNOSIS — H409 Unspecified glaucoma: Secondary | ICD-10-CM | POA: Diagnosis not present

## 2020-03-11 DIAGNOSIS — I10 Essential (primary) hypertension: Secondary | ICD-10-CM | POA: Diagnosis not present

## 2020-03-13 ENCOUNTER — Other Ambulatory Visit: Payer: Self-pay | Admitting: Family Medicine

## 2020-03-13 DIAGNOSIS — G8929 Other chronic pain: Secondary | ICD-10-CM | POA: Diagnosis not present

## 2020-03-13 DIAGNOSIS — M1990 Unspecified osteoarthritis, unspecified site: Secondary | ICD-10-CM | POA: Diagnosis not present

## 2020-03-13 DIAGNOSIS — I10 Essential (primary) hypertension: Secondary | ICD-10-CM | POA: Diagnosis not present

## 2020-03-13 DIAGNOSIS — H409 Unspecified glaucoma: Secondary | ICD-10-CM | POA: Diagnosis not present

## 2020-03-13 DIAGNOSIS — K219 Gastro-esophageal reflux disease without esophagitis: Secondary | ICD-10-CM | POA: Diagnosis not present

## 2020-03-13 DIAGNOSIS — Z8673 Personal history of transient ischemic attack (TIA), and cerebral infarction without residual deficits: Secondary | ICD-10-CM | POA: Diagnosis not present

## 2020-03-13 DIAGNOSIS — Z9181 History of falling: Secondary | ICD-10-CM | POA: Diagnosis not present

## 2020-03-13 DIAGNOSIS — E782 Mixed hyperlipidemia: Secondary | ICD-10-CM | POA: Diagnosis not present

## 2020-03-14 DIAGNOSIS — K219 Gastro-esophageal reflux disease without esophagitis: Secondary | ICD-10-CM | POA: Diagnosis not present

## 2020-03-14 DIAGNOSIS — E782 Mixed hyperlipidemia: Secondary | ICD-10-CM | POA: Diagnosis not present

## 2020-03-14 DIAGNOSIS — Z8673 Personal history of transient ischemic attack (TIA), and cerebral infarction without residual deficits: Secondary | ICD-10-CM | POA: Diagnosis not present

## 2020-03-14 DIAGNOSIS — G8929 Other chronic pain: Secondary | ICD-10-CM | POA: Diagnosis not present

## 2020-03-14 DIAGNOSIS — M1990 Unspecified osteoarthritis, unspecified site: Secondary | ICD-10-CM | POA: Diagnosis not present

## 2020-03-14 DIAGNOSIS — I10 Essential (primary) hypertension: Secondary | ICD-10-CM | POA: Diagnosis not present

## 2020-03-14 DIAGNOSIS — Z9181 History of falling: Secondary | ICD-10-CM | POA: Diagnosis not present

## 2020-03-14 DIAGNOSIS — H409 Unspecified glaucoma: Secondary | ICD-10-CM | POA: Diagnosis not present

## 2020-03-17 NOTE — Telephone Encounter (Signed)
Pt is scheduled for appointment with Dr Volanda Napoleon on 03/19/2020, refills will be reviewed at the visit

## 2020-03-18 ENCOUNTER — Other Ambulatory Visit: Payer: Self-pay

## 2020-03-18 ENCOUNTER — Telehealth: Payer: Self-pay | Admitting: Family Medicine

## 2020-03-18 DIAGNOSIS — K219 Gastro-esophageal reflux disease without esophagitis: Secondary | ICD-10-CM | POA: Diagnosis not present

## 2020-03-18 DIAGNOSIS — M1990 Unspecified osteoarthritis, unspecified site: Secondary | ICD-10-CM | POA: Diagnosis not present

## 2020-03-18 DIAGNOSIS — I10 Essential (primary) hypertension: Secondary | ICD-10-CM | POA: Diagnosis not present

## 2020-03-18 DIAGNOSIS — H409 Unspecified glaucoma: Secondary | ICD-10-CM | POA: Diagnosis not present

## 2020-03-18 DIAGNOSIS — Z8673 Personal history of transient ischemic attack (TIA), and cerebral infarction without residual deficits: Secondary | ICD-10-CM | POA: Diagnosis not present

## 2020-03-18 DIAGNOSIS — G8929 Other chronic pain: Secondary | ICD-10-CM | POA: Diagnosis not present

## 2020-03-18 DIAGNOSIS — E782 Mixed hyperlipidemia: Secondary | ICD-10-CM | POA: Diagnosis not present

## 2020-03-18 DIAGNOSIS — Z9181 History of falling: Secondary | ICD-10-CM | POA: Diagnosis not present

## 2020-03-18 NOTE — Telephone Encounter (Signed)
error 

## 2020-03-18 NOTE — Telephone Encounter (Signed)
[facility] OT called to get verbal orders to continue therapy at   1 x 4 weeks   His number is [phone]  Please advise

## 2020-03-19 ENCOUNTER — Inpatient Hospital Stay (HOSPITAL_COMMUNITY)
Admission: EM | Admit: 2020-03-19 | Discharge: 2020-03-21 | DRG: 683 | Disposition: A | Discharge status: Home Health | Payer: Medicare HMO | Attending: Internal Medicine | Admitting: Internal Medicine

## 2020-03-19 ENCOUNTER — Emergency Department (HOSPITAL_COMMUNITY): Payer: Medicare HMO

## 2020-03-19 ENCOUNTER — Ambulatory Visit: Payer: Medicare HMO | Admitting: Family Medicine

## 2020-03-19 ENCOUNTER — Encounter (HOSPITAL_COMMUNITY): Payer: Self-pay

## 2020-03-19 DIAGNOSIS — E860 Dehydration: Secondary | ICD-10-CM | POA: Diagnosis present

## 2020-03-19 DIAGNOSIS — N189 Chronic kidney disease, unspecified: Secondary | ICD-10-CM | POA: Diagnosis present

## 2020-03-19 DIAGNOSIS — I959 Hypotension, unspecified: Secondary | ICD-10-CM | POA: Diagnosis present

## 2020-03-19 DIAGNOSIS — R531 Weakness: Secondary | ICD-10-CM | POA: Diagnosis not present

## 2020-03-19 DIAGNOSIS — Z720 Tobacco use: Secondary | ICD-10-CM | POA: Diagnosis not present

## 2020-03-19 DIAGNOSIS — K257 Chronic gastric ulcer without hemorrhage or perforation: Secondary | ICD-10-CM | POA: Diagnosis present

## 2020-03-19 DIAGNOSIS — D649 Anemia, unspecified: Secondary | ICD-10-CM | POA: Diagnosis not present

## 2020-03-19 DIAGNOSIS — Z79899 Other long term (current) drug therapy: Secondary | ICD-10-CM

## 2020-03-19 DIAGNOSIS — E782 Mixed hyperlipidemia: Secondary | ICD-10-CM | POA: Diagnosis not present

## 2020-03-19 DIAGNOSIS — D62 Acute posthemorrhagic anemia: Secondary | ICD-10-CM | POA: Diagnosis not present

## 2020-03-19 DIAGNOSIS — N170 Acute kidney failure with tubular necrosis: Principal | ICD-10-CM | POA: Diagnosis present

## 2020-03-19 DIAGNOSIS — K2100 Gastro-esophageal reflux disease with esophagitis, without bleeding: Secondary | ICD-10-CM

## 2020-03-19 DIAGNOSIS — K219 Gastro-esophageal reflux disease without esophagitis: Secondary | ICD-10-CM | POA: Diagnosis present

## 2020-03-19 DIAGNOSIS — N179 Acute kidney failure, unspecified: Secondary | ICD-10-CM | POA: Diagnosis present

## 2020-03-19 DIAGNOSIS — I10 Essential (primary) hypertension: Secondary | ICD-10-CM | POA: Diagnosis present

## 2020-03-19 DIAGNOSIS — K449 Diaphragmatic hernia without obstruction or gangrene: Secondary | ICD-10-CM

## 2020-03-19 DIAGNOSIS — Z8546 Personal history of malignant neoplasm of prostate: Secondary | ICD-10-CM

## 2020-03-19 DIAGNOSIS — R52 Pain, unspecified: Secondary | ICD-10-CM | POA: Diagnosis not present

## 2020-03-19 DIAGNOSIS — Z9181 History of falling: Secondary | ICD-10-CM | POA: Diagnosis not present

## 2020-03-19 DIAGNOSIS — I129 Hypertensive chronic kidney disease with stage 1 through stage 4 chronic kidney disease, or unspecified chronic kidney disease: Secondary | ICD-10-CM | POA: Diagnosis not present

## 2020-03-19 DIAGNOSIS — D500 Iron deficiency anemia secondary to blood loss (chronic): Secondary | ICD-10-CM | POA: Diagnosis not present

## 2020-03-19 DIAGNOSIS — F17210 Nicotine dependence, cigarettes, uncomplicated: Secondary | ICD-10-CM | POA: Diagnosis present

## 2020-03-19 DIAGNOSIS — Z743 Need for continuous supervision: Secondary | ICD-10-CM | POA: Diagnosis not present

## 2020-03-19 DIAGNOSIS — Z9079 Acquired absence of other genital organ(s): Secondary | ICD-10-CM | POA: Diagnosis not present

## 2020-03-19 DIAGNOSIS — Z8673 Personal history of transient ischemic attack (TIA), and cerebral infarction without residual deficits: Secondary | ICD-10-CM | POA: Diagnosis not present

## 2020-03-19 DIAGNOSIS — Z20822 Contact with and (suspected) exposure to covid-19: Secondary | ICD-10-CM | POA: Diagnosis present

## 2020-03-19 DIAGNOSIS — E785 Hyperlipidemia, unspecified: Secondary | ICD-10-CM | POA: Diagnosis present

## 2020-03-19 DIAGNOSIS — D509 Iron deficiency anemia, unspecified: Secondary | ICD-10-CM | POA: Diagnosis present

## 2020-03-19 DIAGNOSIS — F39 Unspecified mood [affective] disorder: Secondary | ICD-10-CM | POA: Diagnosis present

## 2020-03-19 DIAGNOSIS — E872 Acidosis: Secondary | ICD-10-CM | POA: Diagnosis not present

## 2020-03-19 DIAGNOSIS — M1990 Unspecified osteoarthritis, unspecified site: Secondary | ICD-10-CM | POA: Diagnosis not present

## 2020-03-19 DIAGNOSIS — H409 Unspecified glaucoma: Secondary | ICD-10-CM | POA: Diagnosis not present

## 2020-03-19 DIAGNOSIS — G8929 Other chronic pain: Secondary | ICD-10-CM | POA: Diagnosis not present

## 2020-03-19 DIAGNOSIS — R42 Dizziness and giddiness: Secondary | ICD-10-CM | POA: Diagnosis not present

## 2020-03-19 DIAGNOSIS — R6889 Other general symptoms and signs: Secondary | ICD-10-CM | POA: Diagnosis not present

## 2020-03-19 LAB — CBC WITH DIFFERENTIAL/PLATELET
Abs Immature Granulocytes: 0.01 10*3/uL (ref 0.00–0.07)
Basophils Absolute: 0 10*3/uL (ref 0.0–0.1)
Basophils Relative: 1 %
Eosinophils Absolute: 0 10*3/uL (ref 0.0–0.5)
Eosinophils Relative: 1 %
HCT: 22.6 % — ABNORMAL LOW (ref 39.0–52.0)
Hemoglobin: 6.2 g/dL — CL (ref 13.0–17.0)
Immature Granulocytes: 0 %
Lymphocytes Relative: 23 %
Lymphs Abs: 1 10*3/uL (ref 0.7–4.0)
MCH: 20.2 pg — ABNORMAL LOW (ref 26.0–34.0)
MCHC: 27.4 g/dL — ABNORMAL LOW (ref 30.0–36.0)
MCV: 73.6 fL — ABNORMAL LOW (ref 80.0–100.0)
Monocytes Absolute: 0.6 10*3/uL (ref 0.1–1.0)
Monocytes Relative: 12 %
Neutro Abs: 2.8 10*3/uL (ref 1.7–7.7)
Neutrophils Relative %: 63 %
Platelets: 502 10*3/uL — ABNORMAL HIGH (ref 150–400)
RBC: 3.07 MIL/uL — ABNORMAL LOW (ref 4.22–5.81)
RDW: 20.6 % — ABNORMAL HIGH (ref 11.5–15.5)
WBC: 4.4 10*3/uL (ref 4.0–10.5)
nRBC: 0 % (ref 0.0–0.2)

## 2020-03-19 LAB — I-STAT CHEM 8, ED
BUN: 41 mg/dL — ABNORMAL HIGH (ref 8–23)
Calcium, Ion: 1 mmol/L — ABNORMAL LOW (ref 1.15–1.40)
Chloride: 106 mmol/L (ref 98–111)
Creatinine, Ser: 5.5 mg/dL — ABNORMAL HIGH (ref 0.61–1.24)
Glucose, Bld: 96 mg/dL (ref 70–99)
HCT: 19 % — ABNORMAL LOW (ref 39.0–52.0)
Hemoglobin: 6.5 g/dL — CL (ref 13.0–17.0)
Potassium: 4.1 mmol/L (ref 3.5–5.1)
Sodium: 135 mmol/L (ref 135–145)
TCO2: 16 mmol/L — ABNORMAL LOW (ref 22–32)

## 2020-03-19 LAB — COMPREHENSIVE METABOLIC PANEL
ALT: 9 U/L (ref 0–44)
AST: 12 U/L — ABNORMAL LOW (ref 15–41)
Albumin: 3.8 g/dL (ref 3.5–5.0)
Alkaline Phosphatase: 86 U/L (ref 38–126)
Anion gap: 14 (ref 5–15)
BUN: 50 mg/dL — ABNORMAL HIGH (ref 8–23)
CO2: 15 mmol/L — ABNORMAL LOW (ref 22–32)
Calcium: 8.4 mg/dL — ABNORMAL LOW (ref 8.9–10.3)
Chloride: 103 mmol/L (ref 98–111)
Creatinine, Ser: 5.17 mg/dL — ABNORMAL HIGH (ref 0.61–1.24)
GFR calc Af Amer: 12 mL/min — ABNORMAL LOW (ref >60)
GFR calc non Af Amer: 10 mL/min — ABNORMAL LOW (ref >60)
Glucose, Bld: 104 mg/dL — ABNORMAL HIGH (ref 70–99)
Potassium: 4.4 mmol/L (ref 3.5–5.1)
Sodium: 132 mmol/L — ABNORMAL LOW (ref 135–145)
Total Bilirubin: 0.7 mg/dL (ref 0.3–1.2)
Total Protein: 6.5 g/dL (ref 6.5–8.1)

## 2020-03-19 LAB — RETICULOCYTES
Immature Retic Fract: 16.6 % — ABNORMAL HIGH (ref 2.3–15.9)
RBC.: 3.08 MIL/uL — ABNORMAL LOW (ref 4.22–5.81)
Retic Count, Absolute: 26.5 10*3/uL (ref 19.0–186.0)
Retic Ct Pct: 0.9 % (ref 0.4–3.1)

## 2020-03-19 LAB — VITAMIN B12: Vitamin B-12: 437 pg/mL (ref 180–914)

## 2020-03-19 LAB — IRON AND TIBC
Iron: 13 ug/dL — ABNORMAL LOW (ref 45–182)
Saturation Ratios: 3 % — ABNORMAL LOW (ref 17.9–39.5)
TIBC: 424 ug/dL (ref 250–450)
UIBC: 411 ug/dL

## 2020-03-19 LAB — LACTIC ACID, PLASMA: Lactic Acid, Venous: 1.4 mmol/L (ref 0.5–1.9)

## 2020-03-19 LAB — SARS CORONAVIRUS 2 BY RT PCR (HOSPITAL ORDER, PERFORMED IN ~~LOC~~ HOSPITAL LAB): SARS Coronavirus 2: NEGATIVE

## 2020-03-19 LAB — PREPARE RBC (CROSSMATCH)

## 2020-03-19 LAB — POC OCCULT BLOOD, ED: Fecal Occult Bld: NEGATIVE

## 2020-03-19 LAB — FOLATE: Folate: 14 ng/mL (ref >5.9)

## 2020-03-19 LAB — FERRITIN: Ferritin: 2 ng/mL — ABNORMAL LOW (ref 24–336)

## 2020-03-19 MED ORDER — CALCIUM CARBONATE ANTACID 1250 MG/5ML PO SUSP
500.0000 mg | Freq: Four times a day (QID) | ORAL | Status: DC | PRN
  Filled 2020-03-19: qty 5

## 2020-03-19 MED ORDER — HYDROXYZINE HCL 25 MG PO TABS: 25.0000 mg | ORAL_TABLET | Freq: Three times a day (TID) | ORAL | Status: DC | PRN

## 2020-03-19 MED ORDER — DOCUSATE SODIUM 283 MG RE ENEM
1.0000 | ENEMA | RECTAL | Status: DC | PRN
  Filled 2020-03-19: qty 1

## 2020-03-19 MED ORDER — SODIUM CHLORIDE 0.9 % IV SOLN
510.0000 mg | INTRAVENOUS | Status: DC
  Administered 2020-03-19: 510 mg via INTRAVENOUS
  Filled 2020-03-19: qty 17

## 2020-03-19 MED ORDER — SODIUM CHLORIDE 0.9% FLUSH
3.0000 mL | Freq: Two times a day (BID) | INTRAVENOUS | Status: DC
  Administered 2020-03-19 – 2020-03-21 (×3): 3 mL via INTRAVENOUS

## 2020-03-19 MED ORDER — NEPRO/CARBSTEADY PO LIQD
237.0000 mL | Freq: Three times a day (TID) | ORAL | Status: DC | PRN
  Filled 2020-03-19: qty 237

## 2020-03-19 MED ORDER — CAMPHOR-MENTHOL 0.5-0.5 % EX LOTN
1.0000 "application " | TOPICAL_LOTION | Freq: Three times a day (TID) | CUTANEOUS | Status: DC | PRN
  Filled 2020-03-19: qty 222

## 2020-03-19 MED ORDER — ACETAMINOPHEN 325 MG PO TABS
650.0000 mg | ORAL_TABLET | Freq: Four times a day (QID) | ORAL | Status: DC | PRN
  Administered 2020-03-21: 650 mg via ORAL
  Filled 2020-03-19: qty 2

## 2020-03-19 MED ORDER — SODIUM CHLORIDE 0.9 % IV SOLN
80.0000 mg | Freq: Once | INTRAVENOUS | Status: AC
  Administered 2020-03-19: 17:00:00 80 mg via INTRAVENOUS
  Filled 2020-03-19: qty 80

## 2020-03-19 MED ORDER — BUPROPION HCL ER (SR) 100 MG PO TB12
200.0000 mg | ORAL_TABLET | Freq: Every day | ORAL | Status: DC
  Administered 2020-03-21: 200 mg via ORAL
  Filled 2020-03-19 (×2): qty 2

## 2020-03-19 MED ORDER — ONDANSETRON HCL 4 MG/2ML IJ SOLN: 4.0000 mg | Freq: Four times a day (QID) | INTRAMUSCULAR | Status: DC | PRN

## 2020-03-19 MED ORDER — ALPRAZOLAM 0.25 MG PO TABS
0.2500 mg | ORAL_TABLET | Freq: Every day | ORAL | Status: DC | PRN
  Administered 2020-03-19: 0.25 mg via ORAL
  Filled 2020-03-19: qty 1

## 2020-03-19 MED ORDER — CYCLOBENZAPRINE HCL 5 MG PO TABS
5.0000 mg | ORAL_TABLET | Freq: Two times a day (BID) | ORAL | Status: DC
  Administered 2020-03-19 – 2020-03-21 (×3): 5 mg via ORAL
  Filled 2020-03-19 (×4): qty 1

## 2020-03-19 MED ORDER — QUETIAPINE FUMARATE 100 MG PO TABS
300.0000 mg | ORAL_TABLET | Freq: Every day | ORAL | Status: DC
  Administered 2020-03-19 – 2020-03-20 (×2): 300 mg via ORAL
  Filled 2020-03-19 (×2): qty 1

## 2020-03-19 MED ORDER — SORBITOL 70 % SOLN
30.0000 mL | Status: DC | PRN
  Filled 2020-03-19 (×2): qty 30

## 2020-03-19 MED ORDER — SODIUM CHLORIDE 0.9 % IV BOLUS
1000.0000 mL | Freq: Once | INTRAVENOUS | Status: AC
  Administered 2020-03-19: 1000 mL via INTRAVENOUS

## 2020-03-19 MED ORDER — ONDANSETRON HCL 4 MG PO TABS: 4.0000 mg | ORAL_TABLET | Freq: Four times a day (QID) | ORAL | Status: DC | PRN

## 2020-03-19 MED ORDER — SODIUM CHLORIDE 0.9% IV SOLUTION: Freq: Once | INTRAVENOUS | Status: AC

## 2020-03-19 MED ORDER — SODIUM CHLORIDE 0.9 % IV SOLN
8.0000 mg/h | INTRAVENOUS | Status: DC
  Administered 2020-03-19 – 2020-03-21 (×4): 8 mg/h via INTRAVENOUS
  Filled 2020-03-19 (×6): qty 80

## 2020-03-19 MED ORDER — LACTATED RINGERS IV SOLN: INTRAVENOUS | Status: DC

## 2020-03-19 MED ORDER — ACETAMINOPHEN 650 MG RE SUPP: 650.0000 mg | Freq: Four times a day (QID) | RECTAL | Status: DC | PRN

## 2020-03-19 MED ORDER — TRAMADOL HCL 50 MG PO TABS: 50.0000 mg | ORAL_TABLET | Freq: Two times a day (BID) | ORAL | Status: DC | PRN

## 2020-03-19 MED ORDER — NICOTINE 14 MG/24HR TD PT24
14.0000 mg | MEDICATED_PATCH | Freq: Every day | TRANSDERMAL | Status: DC
  Administered 2020-03-19 – 2020-03-21 (×3): 14 mg via TRANSDERMAL
  Filled 2020-03-19 (×3): qty 1

## 2020-03-19 MED ORDER — ZOLPIDEM TARTRATE 5 MG PO TABS: 5.0000 mg | ORAL_TABLET | Freq: Every evening | ORAL | Status: DC | PRN

## 2020-03-19 NOTE — ED Notes (Addendum)
RN spoke to MD Zierle-Ghosh , paged to inquired about 2nd unit blood infusion, MD states to pull a CBC, and if it is above 7, then to hold second unit, if below 7 start 2nd  unit, RN will continue to monitor

## 2020-03-19 NOTE — H&P (Signed)
History and Physical    [name] GYB:[id number] DOB: [birth year] DOA: 03/19/2020  PCP: Billie Ruddy, MD Consultants:  Mamie Laurel - PM&R; Danis - GI Patient coming from:  Home - lives alone; NOK: Felicity Pellegrini [place], [phone]  Chief Complaint: Dizziness, hypotension  HPI: [name] is a 69 y.o. male with medical history significant of CVA (2016); prostate CA; ischemic colitis (2012); HTN; HLD; and Cameron ulcer (2019) presenting with dizziness, hypotension.  He was taking HTN medication.  His HH aide came to check on him today and his BP was low.  He denies feeling bad but was slightly dizzy.  He has received blood before, last time was in March.  No SOB with ambulation.  No blood in stools.  He is having normal BMs.    ED Course:  Symptomatic anemia causing hypotension.  H/o reflex esophagitis.  Presented with dizziness, HHN found BP in 70s.  Takes 3 BP meds and hasn't been checking at home.  Pale, BP improved to 92/.  Initially with septic work-up, but doubt this.  AKI, creatinine 5, BUN 50, Hgb 6, heme negative. Given blood, Protonix.  GI to see.  COVID pending.  Anemia panel pending.  Review of Systems: As per HPI; otherwise review of systems reviewed and negative.   Ambulatory Status:  Ambulates with walker, also has electric wheelchair  COVID Vaccine Status:  Complete  Past Medical History:  Diagnosis Date  . Arthritis   . Cameron ulcer, chronic 01/11/2018  . Chronic back pain   . GERD (gastroesophageal reflux disease)    uses Baking Soda  . GIB (gastrointestinal bleeding) 2012  . Glaucoma    right eye  . Headache    occasionally  . Hepatitis C   . Hiatal hernia with gastroesophageal reflux disease and esophagitis 01/05/2018  . History of blood transfusion    no abnormal reaction noted  . History of gout    doesn't take any meds  . Hyperlipidemia    not on any meds  . Hypertension    takes Amlodipine and Lisinopril daily  . Insomnia    takes  Ambien nightly  . Ischemic colitis ([place]) 2012  . Joint pain   . Nocturia   . Numbness    both legs occasionally  . PONV (postoperative nausea and vomiting)   . Prostate cancer ([place])   . Shortness of breath dyspnea    rarely but when notices he can be lying/sitting/exertion.Dr.Hochrein is aware per pt  . Stroke ([place]) 2016   . Urinary frequency   . Urinary urgency     Past Surgical History:  Procedure Laterality Date  . APPENDECTOMY    . BIOPSY  01/11/2018   Procedure: BIOPSY;  Surgeon: Gatha Mayer, MD;  Location: [facility] ENDOSCOPY;  Service: Endoscopy;;  . COLONOSCOPY    . COLONOSCOPY N/A 10/26/2015   Procedure: COLONOSCOPY;  Surgeon: Doran Stabler, MD;  Location[facility] ENDOSCOPY;  Service: Endoscopy;  Laterality: N/A;  . ESOPHAGOGASTRODUODENOSCOPY    . ESOPHAGOGASTRODUODENOSCOPY N/A 10/26/2015   Procedure: ESOPHAGOGASTRODUODENOSCOPY (EGD);  Surgeon: Doran Stabler, MD;  Location: [facility] ENDOSCOPY;  Service: Endoscopy;  Laterality: N/A;  . ESOPHAGOGASTRODUODENOSCOPY (EGD) WITH PROPOFOL N/A 01/11/2018   Procedure: ESOPHAGOGASTRODUODENOSCOPY (EGD) WITH PROPOFOL;  Surgeon: Gatha Mayer, MD;  Location: [facility] ENDOSCOPY;  Service: Endoscopy;  Laterality: N/A;  . INGUINAL HERNIA REPAIR Right 11/04/2014   Procedure: RIGHT INGUINAL HERNIA REPAIR WITH MESH;  Surgeon: Jackolyn Confer, MD;  Location: [place];  Service:  General;  Laterality: Right;  . MASS EXCISION N/A 11/04/2014   Procedure: REMOVAL OF RIGHT GROIN SOFT TISSUE MASS;  Surgeon: Jackolyn Confer, MD;  Location: [place];  Service: General;  Laterality: N/A;  . Multiple abdominal surgeries     total of 13  . PROSTATECTOMY    . SMALL INTESTINE SURGERY      Social History   Socioeconomic History  . Marital status: Legally Separated    Spouse name: Not on file  . Number of children: 2  . Years of education: Not on file  . Highest education level: Not on file  Occupational History  . Occupation: retired  Tobacco Use  . Smoking status:  Current Every Day Smoker    Packs/day: 0.50    Years: 50.00    Pack years: 25.00    Types: Cigarettes  . Smokeless tobacco: Never Used  Vaping Use  . Vaping Use: Former  Substance and Sexual Activity  . Alcohol use: Yes    Alcohol/week: 0.0 standard drinks    Comment: occasionally  . Drug use: Yes    Frequency: 5.0 times per week    Types: Marijuana    Comment: "as often as I can get it"  . Sexual activity: Yes  Other Topics Concern  . Not on file  Social History Narrative   One living child .  One murdered.  Lives with girlfriend.     Social Determinants of Health   Financial Resource Strain:   . Difficulty of Paying Living Expenses: Not on file  Food Insecurity:   . Worried About Charity fundraiser in the Last Year: Not on file  . Ran Out of Food in the Last Year: Not on file  Transportation Needs:   . Lack of Transportation (Medical): Not on file  . Lack of Transportation (Non-Medical): Not on file  Physical Activity:   . Days of Exercise per Week: Not on file  . Minutes of Exercise per Session: Not on file  Stress:   . Feeling of Stress : Not on file  Social Connections:   . Frequency of Communication with Friends and Family: Not on file  . Frequency of Social Gatherings with Friends and Family: Not on file  . Attends Religious Services: Not on file  . Active Member of Clubs or Organizations: Not on file  . Attends Archivist Meetings: Not on file  . Marital Status: Not on file  Intimate Partner Violence:   . Fear of Current or Ex-Partner: Not on file  . Emotionally Abused: Not on file  . Physically Abused: Not on file  . Sexually Abused: Not on file    Allergies  Allergen Reactions  . Penicillins Anaphylaxis    Has patient had a PCN reaction causing immediate rash, facial/tongue/throat swelling, SOB or lightheadedness with hypotension: Yes Has patient had a PCN reaction causing severe rash involving mucus membranes or skin necrosis: No Has  patient had a PCN reaction that required hospitalization: No Has patient had a PCN reaction occurring within the last 10 years: No If all of the above answers are "NO", then may proceed with Cephalosporin use..  . Sudafed [Pseudoephedrine] Other (See Comments)    Heart "races"    Family History  Problem Relation Age of Onset  . Alzheimer's disease Father   . Cancer Mother        Lymph node  . Heart failure Brother 45       Transplant 13 years ago  .  CAD Brother   . Heart disease Sister 38       MI    Prior to Admission medications   Medication Sig Start Date End Date Taking? Authorizing Provider  acetaminophen (TYLENOL) 325 MG tablet Take 2 tablets (650 mg total) by mouth 4 (four) times daily -  with meals and at bedtime. 08/18/18   Love, Ivan Anchors, PA-C  ALPRAZolam (XANAX) 0.25 MG tablet Take 1 tablet (0.25 mg total) by mouth daily as needed for anxiety. 04/12/19   Laurey Morale, MD  Blood Pressure Monitoring (BLOOD PRESSURE MONITOR/L CUFF) MISC Use as directed daily. 12/28/19   Billie Ruddy, MD  buPROPion (WELLBUTRIN SR) 200 MG 12 hr tablet TAKE 1 TABLET BY MOUTH DAILY Patient taking differently: Take 200 mg by mouth daily.  01/17/20   Meredith Staggers, MD  chlorthalidone (HYGROTON) 25 MG tablet Take 1 tablet (25 mg total) by mouth daily. 02/05/20   Billie Ruddy, MD  cyclobenzaprine (FLEXERIL) 5 MG tablet TAKE 1 TABLET BY MOUTH TWICE A DAY AS NEEDED FOR MUSCLE SPASMS Patient taking differently: Take 5 mg by mouth in the morning and at bedtime.  01/23/20   Meredith Staggers, MD  ferrous sulfate 325 (65 FE) MG tablet TAKE 1 TABLET BY MOUTH THREE TWICE A DAY WITH MEALS Patient taking differently: Take 325 mg by mouth 3 (three) times daily with meals.  03/30/18   Billie Ruddy, MD  hydrALAZINE (APRESOLINE) 25 MG tablet TAKE 1 TABLET BY MOUTH THREE TIMES A DAY 02/18/20   Billie Ruddy, MD  Incontinence Supply Disposable (PROCARE ADULT BRIEFS LARGE) MISC As needed 11/21/19   Billie Ruddy, MD  lisinopril (ZESTRIL) 40 MG tablet Take 1 tablet (40 mg total) by mouth daily. 12/28/19   Billie Ruddy, MD  pantoprazole (PROTONIX) 40 MG tablet TAKE 1 TABLET BY MOUTH TWICE A DAY BEFORE A MEAL 06/22/19   Billie Ruddy, MD  potassium chloride SA (KLOR-CON) 20 MEQ tablet Take 1 tablet (20 mEq total) by mouth daily. 02/05/20   Billie Ruddy, MD  QUEtiapine (SEROQUEL) 200 MG tablet TAKE 1 TABLET BY MOUTH EVERY NIGHT AT BEDTIME Patient taking differently: Take 200 mg by mouth at bedtime.  01/17/20   Meredith Staggers, MD  senna-docusate (SENOKOT-S) 8.6-50 MG tablet Take 1 tablet by mouth 2 (two) times daily. 08/18/18   Love, Ivan Anchors, PA-C  silver sulfADIAZINE (SILVADENE) 1 % cream Apply 1 application topically 2 (two) times daily. 04/12/19   Laurey Morale, MD  sodium chloride (OCEAN) 0.65 % SOLN nasal spray Place 1 spray into both nostrils as needed for congestion. 08/18/18   Love, Ivan Anchors, PA-C  temazepam (RESTORIL) 30 MG capsule Take 1 capsule (30 mg total) by mouth at bedtime as needed for sleep. 02/12/20   Laurey Morale, MD  traMADol (ULTRAM) 50 MG tablet Take 1 tablet (50 mg total) by mouth every 12 (twelve) hours as needed for severe pain (shouder pain). 04/12/19   Laurey Morale, MD  verapamil (CALAN-SR) 180 MG CR tablet Take 1 tablet (180 mg total) by mouth at bedtime. 02/12/20   Laurey Morale, MD    Physical Exam: Vitals:   03/19/20 1615 03/19/20 1630 03/19/20 1801 03/19/20 1816  BP: (!) 100/58 117/87 124/63 (!) 114/93  Pulse:   70 79  Resp: 19 19 18 18   Temp:   97.8 F (36.6 C) 97.8 F (36.6 C)  TempSrc:   Oral Oral  SpO2:         . General:  Appears calm and comfortable and is NAD; he is agitated about having to stay overnight and is adamant that he will not start longer than 1 night . Eyes:  PERRL, EOMI, normal lids, iris . ENT:  grossly normal hearing, lips & tongue, mmm . Neck:  no LAD, masses or thyromegaly . Cardiovascular:  RRR, no m/r/g. No LE edema.   Marland Kitchen Respiratory:   CTA bilaterally with no wheezes/rales/rhonchi.  Normal respiratory effort. . Abdomen:  soft, NT, ND, NABS . Skin:  no rash or induration seen on limited exam . Musculoskeletal:  grossly normal tone BUE/BLE, good ROM, no bony abnormality . Psychiatric:  grossly normal mood and affect, speech fluent and appropriate, AOx3 Neurologic:  CN 2-12 grossly intact, moves all extremities in coordinated fashion   Radiological Exams on Admission: DG Chest Port 1 View  Result Date: 03/19/2020 CLINICAL DATA:  Hypertension EXAM: PORTABLE CHEST 1 VIEW COMPARISON:  None. FINDINGS: Lungs are well expanded, symmetric, and clear. No pneumothorax or pleural effusion. Cardiac size within normal limits. Moderate hiatal hernia, unchanged. Pulmonary vascularity is normal. Osseous structures are age-appropriate. No acute bone abnormality. IMPRESSION: 1. No acute cardiopulmonary findings. 2. Moderate hiatal hernia. Electronically Signed   By: Fidela Salisbury MD   On: 03/19/2020 15:42    EKG: Independently reviewed.  NSR with rate 69; IVCD with no evidence of acute ischemia   Labs on Admission: I have personally reviewed the available labs and imaging studies at the time of the admission.  Pertinent labs:   CO2 15 BUN 50/Creatinine 5.17/GFR 12; normal on 7/27 Lactate 1.4 WBC 4.4 Hgb 6.2; 7.9 on 7/27; 8.2 on 7/15 Platelets 502 Heme negative   Assessment/Plan Principal Problem:   AKI (acute kidney injury) ([place]) Active Problems:   HTN (hypertension)   Tobacco abuse   Cameron ulcer, chronic   Acute on chronic anemia   AKI  -Baseline creatinine is normal.   -Today's creatinine is 5.12 and BUN is 50 on admission.  -Likely due to prerenal failure secondary to dehydration and continuation of ACEI and diruetic use as well as ATN associated with severe anemia. -Hold diuretics for now -IVF at 100 cc/hr plus blood -Follow up renal function by BMP -Avoid ACEI and NSAIDs -Will admit, as his  creatinine is not likely to correct overnight  Symptomatic anemia -Patient with prior multiple admissions for acute on chronic anemia, likely associated with known Cameron ulcers causing intermittent GI bleeding -Hgb 6.2; baseline appears to be 7-8 -MCV 73.6, RDW 20.6 -MCV is <80 and so this is microcytic anemia -Will check ferritin; if <15, diagnostic of iron deficiency; if >100, very unlikely iron deficiency -If ferritin is non-diagnostic, will check transferrin saturation - the lower it is, the more likely this is iron deficiency anemia -Transfuse 2 units PRBC to start and recheck CBC afterwards. -Needs outpatient hematology consultation -Will also give feraheme - one dose today and needs another dose in 1 week  HTN -He was hypotensive this AM -Will hold all home BP medications - chlorthalidone, hydralazine, lisinopril, verapamil -When restarting, would hold ACE and diuretic unless clearly needed  Tobacco dependence -Encourage cessation.   -This was discussed with the patient and should be reviewed on an ongoing basis.   -Patch ordered at patient request.  Mood d/o -Patient became quite agitated about staying in the hospital and almost refused; he adamantly insists that he will leave tomorrow -Continue prn Xanax -Continue Wellbutrin -Increase  Seroquel to 300 mg qhs at patient's request    Note: This patient has been tested and is negative for the novel coronavirus COVID-19.  DVT prophylaxis:   SCDs Code Status: Full - confirmed with patient Family Communication: None present; I spoke with the patient's brother by telephone at the time of admission. Disposition Plan:  The patient is from: home  Anticipated d/c is to: home without [facility] services   Anticipated d/c date will depend on clinical response to treatment, likely 2-3 days  Patient is currently: acutely ill Consults called:  GI  Admission status:  Admit - It is my clinical opinion that admission to [place] is  reasonable and necessary because of the expectation that this patient will require hospital care that crosses at least 2 midnights to treat this condition based on the medical complexity of the problems presented.  Given the aforementioned information, the predictability of an adverse outcome is felt to be significant.    Karmen Bongo MD [facility]   How to contact the [facility] Attending or Consulting provider [place] or covering provider during after hours [place], for this patient?  1. Check the care team in [facility] and look for a) attending/consulting [facility] provider listed and b) the [facility] team listed 2. Log into [email or web address] and use [place]'s universal password to access. If you do not have the password, please contact the hospital operator. 3. Locate the [facility] provider you are looking for under [facility] and page to a number that you can be directly reached. 4. If you still have difficulty reaching the provider, please page the [facility] (Director on Call) for the Hospitalists listed on amion for assistance.   03/19/2020, 7:00 PM

## 2020-03-19 NOTE — ED Provider Notes (Signed)
[facility] EMERGENCY DEPARTMENT Provider Note   CSN: [id number] Arrival date & time: 03/19/20  1502     History Chief Complaint  Patient presents with  . Hypotension    [name] is a 69 y.o. male history of reflux, previous esophagitis on endoscopy here presenting with hypotension.  Patient states that he lives at home and feels lightheaded and dizzy .  He has home health aide that comes and took his blood pressure today and was in the 70s.  Patient denies any fevers or chills or vomiting.  Patient states that he is on 3 blood pressure medicines (hydralazine, lisinopril, verapamil) and he is compliant with and took it this morning.  Patient denies any known Covid exposure.  Patient states that he denies any blood in his stool or black stools.  Patient states that he had previous endoscopy that showed esophagitis.  HPI     Past Medical History:  Diagnosis Date  . Arthritis   . Cameron ulcer, chronic 01/11/2018  . Chronic back pain   . GERD (gastroesophageal reflux disease)    uses Baking Soda  . GIB (gastrointestinal bleeding) 2012  . Glaucoma    right eye  . Headache    occasionally  . Hepatitis C   . Hiatal hernia with gastroesophageal reflux disease and esophagitis 01/05/2018  . History of blood transfusion    no abnormal reaction noted  . History of gout    doesn't take any meds  . Hyperlipidemia    not on any meds  . Hypertension    takes Amlodipine and Lisinopril daily  . Insomnia    takes Ambien nightly  . Ischemic colitis ([place]) 2012  . Joint pain   . Nocturia   . Numbness    both legs occasionally  . PONV (postoperative nausea and vomiting)   . Prostate cancer ([place])   . Shortness of breath dyspnea    rarely but when notices he can be lying/sitting/exertion.Dr.Hochrein is aware per pt  . Stroke ([place]) 2016   . Urinary frequency   . Urinary urgency     Patient Active Problem List   Diagnosis Date Noted  . Reactive depression  10/10/2019  . Acute on chronic anemia   . Pain   . Agitation 07/26/2018  . Hepatitis C 07/26/2018  . Nontraumatic thalamic hemorrhage ([place]) 07/26/2018  . Osteoarthritis of left hip 04/06/2018  . Cameron ulcer, chronic 01/11/2018  . Symptomatic anemia 01/10/2018  . Syncope 01/10/2018  . Hiatal hernia with gastroesophageal reflux disease and esophagitis 01/05/2018  . Insomnia 01/05/2018  . Rotator cuff syndrome of left shoulder 11/21/2017  . Anemia, iron deficiency   . Gastrointestinal bleeding 10/24/2015  . Tobacco abuse   . Acute ischemic stroke ([place])   . Stroke ([place]) 05/31/2015  . HTN (hypertension) 04/10/2014  . Inguinal hernia without mention of obstruction or gangrene, unilateral or unspecified, (not specified as recurrent)-right 01/30/2014    Past Surgical History:  Procedure Laterality Date  . APPENDECTOMY    . BIOPSY  01/11/2018   Procedure: BIOPSY;  Surgeon: Gatha Mayer, MD;  Location: [facility] ENDOSCOPY;  Service: Endoscopy;;  . COLONOSCOPY    . COLONOSCOPY N/A 10/26/2015   Procedure: COLONOSCOPY;  Surgeon: Doran Stabler, MD;  Location: [facility] ENDOSCOPY;  Service: Endoscopy;  Laterality: N/A;  . ESOPHAGOGASTRODUODENOSCOPY    . ESOPHAGOGASTRODUODENOSCOPY N/A 10/26/2015   Procedure: ESOPHAGOGASTRODUODENOSCOPY (EGD);  Surgeon: Doran Stabler, MD;  Location: [facility] ENDOSCOPY;  Service: Endoscopy;  Laterality: N/A;  . ESOPHAGOGASTRODUODENOSCOPY (EGD) WITH PROPOFOL N/A 01/11/2018   Procedure: ESOPHAGOGASTRODUODENOSCOPY (EGD) WITH PROPOFOL;  Surgeon: Gatha Mayer, MD;  Location: [facility] ENDOSCOPY;  Service: Endoscopy;  Laterality: N/A;  . INGUINAL HERNIA REPAIR Right 11/04/2014   Procedure: RIGHT INGUINAL HERNIA REPAIR WITH MESH;  Surgeon: Jackolyn Confer, MD;  Location: [facility];  Service: General;  Laterality: Right;  . MASS EXCISION N/A 11/04/2014   Procedure: REMOVAL OF RIGHT GROIN SOFT TISSUE MASS;  Surgeon: Jackolyn Confer, MD;  Location: [facility];  Service: General;  Laterality: N/A;  .  Multiple abdominal surgeries     total of 13  . PROSTATECTOMY    . SMALL INTESTINE SURGERY         Family History  Problem Relation Age of Onset  . Alzheimer's disease Father   . Cancer Mother        Lymph node  . Heart failure Brother 45       Transplant 13 years ago  . CAD Brother   . Heart disease Sister 43       MI    Social History   Tobacco Use  . Smoking status: Current Every Day Smoker    Packs/day: 0.50    Years: 50.00    Pack years: 25.00    Types: Cigarettes  . Smokeless tobacco: Never Used  Vaping Use  . Vaping Use: Former  Substance Use Topics  . Alcohol use: Yes    Alcohol/week: 0.0 standard drinks    Comment: occasionally  . Drug use: No    Frequency: 5.0 times per week    Home Medications Prior to Admission medications   Medication Sig Start Date End Date Taking? Authorizing Provider  acetaminophen (TYLENOL) 325 MG tablet Take 2 tablets (650 mg total) by mouth 4 (four) times daily -  with meals and at bedtime. 08/18/18   Love, Ivan Anchors, PA-C  ALPRAZolam (XANAX) 0.25 MG tablet Take 1 tablet (0.25 mg total) by mouth daily as needed for anxiety. 04/12/19   Laurey Morale, MD  Blood Pressure Monitoring (BLOOD PRESSURE MONITOR/L CUFF) MISC Use as directed daily. 12/28/19   Billie Ruddy, MD  buPROPion (WELLBUTRIN SR) 200 MG 12 hr tablet TAKE 1 TABLET BY MOUTH DAILY Patient taking differently: Take 200 mg by mouth daily.  01/17/20   Meredith Staggers, MD  chlorthalidone (HYGROTON) 25 MG tablet Take 1 tablet (25 mg total) by mouth daily. 02/05/20   Billie Ruddy, MD  cyclobenzaprine (FLEXERIL) 5 MG tablet TAKE 1 TABLET BY MOUTH TWICE A DAY AS NEEDED FOR MUSCLE SPASMS Patient taking differently: Take 5 mg by mouth in the morning and at bedtime.  01/23/20   Meredith Staggers, MD  ferrous sulfate 325 (65 FE) MG tablet TAKE 1 TABLET BY MOUTH THREE TWICE A DAY WITH MEALS Patient taking differently: Take 325 mg by mouth 3 (three) times daily with meals.  03/30/18    Billie Ruddy, MD  hydrALAZINE (APRESOLINE) 25 MG tablet TAKE 1 TABLET BY MOUTH THREE TIMES A DAY 02/18/20   Billie Ruddy, MD  Incontinence Supply Disposable (PROCARE ADULT BRIEFS LARGE) MISC As needed 11/21/19   Billie Ruddy, MD  lisinopril (ZESTRIL) 40 MG tablet Take 1 tablet (40 mg total) by mouth daily. 12/28/19   Billie Ruddy, MD  pantoprazole (PROTONIX) 40 MG tablet TAKE 1 TABLET BY MOUTH TWICE A DAY BEFORE A MEAL 06/22/19   Billie Ruddy, MD  potassium chloride SA (KLOR-CON) 20  MEQ tablet Take 1 tablet (20 mEq total) by mouth daily. 02/05/20   Billie Ruddy, MD  QUEtiapine (SEROQUEL) 200 MG tablet TAKE 1 TABLET BY MOUTH EVERY NIGHT AT BEDTIME Patient taking differently: Take 200 mg by mouth at bedtime.  01/17/20   Meredith Staggers, MD  senna-docusate (SENOKOT-S) 8.6-50 MG tablet Take 1 tablet by mouth 2 (two) times daily. 08/18/18   Love, Ivan Anchors, PA-C  silver sulfADIAZINE (SILVADENE) 1 % cream Apply 1 application topically 2 (two) times daily. 04/12/19   Laurey Morale, MD  sodium chloride (OCEAN) 0.65 % SOLN nasal spray Place 1 spray into both nostrils as needed for congestion. 08/18/18   Love, Ivan Anchors, PA-C  temazepam (RESTORIL) 30 MG capsule Take 1 capsule (30 mg total) by mouth at bedtime as needed for sleep. 02/12/20   Laurey Morale, MD  traMADol (ULTRAM) 50 MG tablet Take 1 tablet (50 mg total) by mouth every 12 (twelve) hours as needed for severe pain (shouder pain). 04/12/19   Laurey Morale, MD  verapamil (CALAN-SR) 180 MG CR tablet Take 1 tablet (180 mg total) by mouth at bedtime. 02/12/20   Laurey Morale, MD    Allergies    Penicillins and Sudafed [pseudoephedrine]  Review of Systems   Review of Systems  Neurological: Positive for dizziness.  All other systems reviewed and are negative.   Physical Exam Updated Vital Signs BP (!) 92/59   Pulse 71   Temp 98.2 F (36.8 C) (Oral)   Resp 20   SpO2 100%   Physical Exam Vitals and nursing note reviewed.   Constitutional:      Comments: Pale, chronically ill.  HENT:     Head: Normocephalic.     Nose: Nose normal.     Mouth/Throat:     Mouth: Mucous membranes are dry.  Eyes:     Extraocular Movements: Extraocular movements intact.     Comments: Conjunctiva pale  Cardiovascular:     Rate and Rhythm: Normal rate and regular rhythm.     Pulses: Normal pulses.     Heart sounds: Normal heart sounds.  Pulmonary:     Effort: Pulmonary effort is normal.     Breath sounds: Normal breath sounds.  Abdominal:     General: Abdomen is flat.     Palpations: Abdomen is soft.  Genitourinary:    Comments: Rectal-brown stool no obvious hemorrhoids Musculoskeletal:        General: Normal range of motion.     Cervical back: Normal range of motion.  Skin:    General: Skin is warm.     Capillary Refill: Capillary refill takes less than 2 seconds.  Neurological:     General: No focal deficit present.     Mental Status: He is oriented to person, place, and time.  Psychiatric:        Mood and Affect: Mood normal.        Behavior: Behavior normal.     ED Results / Procedures / Treatments   Labs (all labs ordered are listed, but only abnormal results are displayed) Labs Reviewed  CBC WITH DIFFERENTIAL/PLATELET - Abnormal; Notable for the following components:      Result Value   RBC 3.07 (*)    Hemoglobin 6.2 (*)    HCT 22.6 (*)    MCV 73.6 (*)    MCH 20.2 (*)    MCHC 27.4 (*)    RDW 20.6 (*)    Platelets 502 (*)  All other components within normal limits  COMPREHENSIVE METABOLIC PANEL - Abnormal; Notable for the following components:   Sodium 132 (*)    CO2 15 (*)    Glucose, Bld 104 (*)    BUN 50 (*)    Creatinine, Ser 5.17 (*)    Calcium 8.4 (*)    AST 12 (*)    GFR calc non Af Amer 10 (*)    GFR calc Af Amer 12 (*)    All other components within normal limits  I-STAT CHEM 8, ED - Abnormal; Notable for the following components:   BUN 41 (*)    Creatinine, Ser 5.50 (*)     Calcium, Ion 1.00 (*)    TCO2 16 (*)    Hemoglobin 6.5 (*)    HCT 19.0 (*)    All other components within normal limits  CULTURE, BLOOD (ROUTINE X 2)  CULTURE, BLOOD (ROUTINE X 2)  URINE CULTURE  SARS CORONAVIRUS 2 BY RT PCR (HOSPITAL ORDER, [facility])  LACTIC ACID, PLASMA  LACTIC ACID, PLASMA  URINALYSIS, ROUTINE W REFLEX MICROSCOPIC  POC OCCULT BLOOD, ED  TYPE AND SCREEN  PREPARE RBC (CROSSMATCH)    EKG EKG Interpretation  Date/Time:  Wednesday March 19 2020 15:22:32 EDT Ventricular Rate:  69 PR Interval:    QRS Duration: 117 QT Interval:  433 QTC Calculation: 464 R Axis:   10 Text Interpretation: Sinus rhythm Nonspecific intraventricular conduction delay No significant change since last tracing Confirmed by Wandra Arthurs [phone]) on 03/19/2020 3:49:15 PM   Radiology DG Chest Port 1 View  Result Date: 03/19/2020 CLINICAL DATA:  Hypertension EXAM: PORTABLE CHEST 1 VIEW COMPARISON:  None. FINDINGS: Lungs are well expanded, symmetric, and clear. No pneumothorax or pleural effusion. Cardiac size within normal limits. Moderate hiatal hernia, unchanged. Pulmonary vascularity is normal. Osseous structures are age-appropriate. No acute bone abnormality. IMPRESSION: 1. No acute cardiopulmonary findings. 2. Moderate hiatal hernia. Electronically Signed   By: Fidela Salisbury MD   On: 03/19/2020 15:42    Procedures Procedures (including critical care time)  CRITICAL CARE Performed by: Wandra Arthurs   Total critical care time: 30 minutes  Critical care time was exclusive of separately billable procedures and treating other patients.  Critical care was necessary to treat or prevent imminent or life-threatening deterioration.  Critical care was time spent personally by me on the following activities: development of treatment plan with patient and/or surrogate as well as nursing, discussions with consultants, evaluation of patient's response to treatment,  examination of patient, obtaining history from patient or surrogate, ordering and performing treatments and interventions, ordering and review of laboratory studies, ordering and review of radiographic studies, pulse oximetry and re-evaluation of patient's condition.   Medications Ordered in ED Medications  pantoprazole (PROTONIX) 80 mg in sodium chloride 0.9 % 100 mL IVPB (has no administration in time range)  pantoprazole (PROTONIX) 80 mg in sodium chloride 0.9 % 100 mL (0.8 mg/mL) infusion (has no administration in time range)  0.9 %  sodium chloride infusion (Manually program via Guardrails IV Fluids) (has no administration in time range)  sodium chloride 0.9 % bolus 1,000 mL (0 mLs Intravenous Stopped 03/19/20 1613)    ED Course  I have reviewed the triage vital signs and the nursing notes.  Pertinent labs & imaging results that were available during my care of the patient were reviewed by me and considered in my medical decision making (see chart for details).  MDM Rules/Calculators/A&P                          Howell Groesbeck is a 69 y.o. male here presenting with hypotension.  Differential is broad.  Consider renal failure versus anemia versus sepsis. Patient is afebrile.  Patient did not receive the Covid vaccine but had no known Covid exposure. Plan to get CBC, CMP, lactate, cultures, urinalysis, chest x-ray.  Will give IV bolus.  If there is no source of infection identified, will hold antibiotics.  4:35 PM Hemoglobin is 6.2.  Ordered 2 units PRBC.  Patient also is in acute renal failure with creatinine of 5 and a BUN of 50. Patient was given IV fluids and blood pressure came up to upper 90s to 100.  Patient was also started on Protonix bolus and drip.  I talked to [place], GI who will see patient as a consult.  Since patient's guaiac is negative, I also ordered anemia panel prior to transfusion.  Hospitalist to admit and Covid is pending.  Final Clinical  Impression(s) / ED Diagnoses Final diagnoses:  None    Rx / DC Orders ED Discharge Orders    None       Drenda Freeze, MD 03/19/20 [phone]

## 2020-03-19 NOTE — ED Notes (Signed)
Daughter said he was recently put on a different depression medication that she thinks may have caused his BP to drop if asked he is able to tell you the name of it

## 2020-03-19 NOTE — ED Notes (Signed)
Unable to start a second IV attempted 2x. Pt did not tolerate well. Protonix paused while blood is being administered.

## 2020-03-19 NOTE — ED Triage Notes (Addendum)
Pt reports taking 3 unknown BP medication without checking BP before administration. Home health aid arrived and received BP of 80/40. EMS called. EMS reports BP of 70/40 about 350 ml given PTA. Pt c/o of dizziness at time of triage.

## 2020-03-19 NOTE — Consult Note (Addendum)
[facility] Consult: 4:23 PM 03/19/2020  LOS: 0 days    Referring Provider: Dr Darl Householder in ED  Primary Care Physician:  Billie Ruddy, MD Primary Gastroenterologist:  Wilfrid Lund, MD    Reason for Consultation:  Anemia   HPI: [name] is a 69 y.o. male.  PMH Hep C, eradicated w Mayvret 2018, undetectable virus x 3 in 2018.  Fibrosis score F3 by fibro-sure 2017.  Degenerative lumbar spine disease/stenosis.  CVA 2016.  Colitis in 2012, assumed to be ischemic. Bleeding peptic ulcers requiring surgery as a teenager.  Additional surgeries: 2001 ex lap with LOA for SBO, 2016 right inguinal hernia repair with mesh, ruptured appendectomy 1969, partial prostatectomy 2011 (no cancer on path).   Recurrent iron def anemia, FOBT+/dark stools requiring blood transfusions dating back to at least 2007.     2007 colonoscopy per Dr Collene Mares,  poor prep. "Large amount of black debris, cold biopsies of 3 sessile polyps (hyperplastic) ?~ 2012 capsule endoscopy?, have not found results.  . 10/2015 EGD by Dr. Loletha Carrow for IDA, FOBT positive.  Other than medium sized hiatal hernia the exam was normal 10/2015 colonoscopy.  Internal hemorrhoids, other wise normal study. 12/2017 EGD.  LA grade B reflux esophagitis, 10 cm HH.  Friable mucosa within the herniated gastric cardia/fundus felt to be the likely source for chronic GI blood loss.  Gastric biopsy path: Reactive gastropathy with ulceration, chronic inflammation and foreign material.  No H. pylori, metaplasia, malignancy 12/2017 upper GI series: Moderate, sliding HH involving 1/3rd of stomach. Dr. Carlean Purl mentioned outpatient surgical consult for [facility] repair, but never sent for eval as he is poor surgical candidate.    Has received feraheme, PRBCs in past. Last Feraheme was in 07/2018, last  PRBCs 09/2019: Hgb 7.1.   At 07/2018 hospital eval for recurrent blood loss anemia, Dr Carlean Purl noted "chronic recurrent blood loss most likely from hiatal hernia and [name] ulcers - there is no need to do endoscopic evaluation"  Suggested Feraheme x 2 and daily po iron.    Presents today w weakness, dizziness,  BP 70/40.  Recent changes in anti-depressant.  Hgb 6.2.  Was 7.9 on 7/27, and range of 7.1 to 8.2 from March through July.  MCV 73.     GFR 12.  Was > 60 on 7/27  Pt says he is not taking bid protonix and tid po iron but need to confirm this with his home health providers MAR.  No blood thinners or platelet disrupting meds.  Appetite has been poor since he had a stroke a year or so ago at which time he lost his sense of taste and smell.  He says he is lost weight but it is unclear how much. Not using NSAIDs or other pain relievers Says his BMs are fairly regular and occasionally might see dark stool but for the most part does not pay attention to the color of his stools.      Past Medical History:  Diagnosis Date  . Arthritis   . Cameron ulcer, chronic 01/11/2018  .  Chronic back pain   . GERD (gastroesophageal reflux disease)    uses Baking Soda  . GIB (gastrointestinal bleeding) 2012  . Glaucoma    right eye  . Headache    occasionally  . Hepatitis C   . Hiatal hernia with gastroesophageal reflux disease and esophagitis 01/05/2018  . History of blood transfusion    no abnormal reaction noted  . History of gout    doesn't take any meds  . Hyperlipidemia    not on any meds  . Hypertension    takes Amlodipine and Lisinopril daily  . Insomnia    takes Ambien nightly  . Ischemic colitis ([place]) 2012  . Joint pain   . Nocturia   . Numbness    both legs occasionally  . PONV (postoperative nausea and vomiting)   . Prostate cancer ([place])   . Shortness of breath dyspnea    rarely but when notices he can be lying/sitting/exertion.Dr.Hochrein is aware per pt  . Stroke ([place]) 2016    . Urinary frequency   . Urinary urgency     Past Surgical History:  Procedure Laterality Date  . APPENDECTOMY    . BIOPSY  01/11/2018   Procedure: BIOPSY;  Surgeon: Gatha Mayer, MD;  Location: [facility] ENDOSCOPY;  Service: Endoscopy;;  . COLONOSCOPY    . COLONOSCOPY N/A 10/26/2015   Procedure: COLONOSCOPY;  Surgeon: Doran Stabler, MD;  Location: [facility] ENDOSCOPY;  Service: Endoscopy;  Laterality: N/A;  . ESOPHAGOGASTRODUODENOSCOPY    . ESOPHAGOGASTRODUODENOSCOPY N/A 10/26/2015   Procedure: ESOPHAGOGASTRODUODENOSCOPY (EGD);  Surgeon: Doran Stabler, MD;  Location: [facility] ENDOSCOPY;  Service: Endoscopy;  Laterality: N/A;  . ESOPHAGOGASTRODUODENOSCOPY (EGD) WITH PROPOFOL N/A 01/11/2018   Procedure: ESOPHAGOGASTRODUODENOSCOPY (EGD) WITH PROPOFOL;  Surgeon: Gatha Mayer, MD;  Location: [facility] ENDOSCOPY;  Service: Endoscopy;  Laterality: N/A;  . INGUINAL HERNIA REPAIR Right 11/04/2014   Procedure: RIGHT INGUINAL HERNIA REPAIR WITH MESH;  Surgeon: Jackolyn Confer, MD;  Location: [place];  Service: General;  Laterality: Right;  . MASS EXCISION N/A 11/04/2014   Procedure: REMOVAL OF RIGHT GROIN SOFT TISSUE MASS;  Surgeon: Jackolyn Confer, MD;  Location: [place];  Service: General;  Laterality: N/A;  . Multiple abdominal surgeries     total of 13  . PROSTATECTOMY    . SMALL INTESTINE SURGERY      Prior to Admission medications   Medication Sig Start Date End Date Taking? Authorizing Provider  acetaminophen (TYLENOL) 325 MG tablet Take 2 tablets (650 mg total) by mouth 4 (four) times daily -  with meals and at bedtime. 08/18/18   Love, Ivan Anchors, PA-C  ALPRAZolam (XANAX) 0.25 MG tablet Take 1 tablet (0.25 mg total) by mouth daily as needed for anxiety. 04/12/19   Laurey Morale, MD  Blood Pressure Monitoring (BLOOD PRESSURE MONITOR/L CUFF) MISC Use as directed daily. 12/28/19   Billie Ruddy, MD  buPROPion (WELLBUTRIN SR) 200 MG 12 hr tablet TAKE 1 TABLET BY MOUTH DAILY Patient taking differently: Take 200  mg by mouth daily.  01/17/20   Meredith Staggers, MD  chlorthalidone (HYGROTON) 25 MG tablet Take 1 tablet (25 mg total) by mouth daily. 02/05/20   Billie Ruddy, MD  cyclobenzaprine (FLEXERIL) 5 MG tablet TAKE 1 TABLET BY MOUTH TWICE A DAY AS NEEDED FOR MUSCLE SPASMS Patient taking differently: Take 5 mg by mouth in the morning and at bedtime.  01/23/20   Meredith Staggers, MD  ferrous sulfate 325 (65 FE) MG  tablet TAKE 1 TABLET BY MOUTH THREE TWICE A DAY WITH MEALS Patient taking differently: Take 325 mg by mouth 3 (three) times daily with meals.  03/30/18   Billie Ruddy, MD  hydrALAZINE (APRESOLINE) 25 MG tablet TAKE 1 TABLET BY MOUTH THREE TIMES A DAY 02/18/20   Billie Ruddy, MD  Incontinence Supply Disposable (PROCARE ADULT BRIEFS LARGE) MISC As needed 11/21/19   Billie Ruddy, MD  lisinopril (ZESTRIL) 40 MG tablet Take 1 tablet (40 mg total) by mouth daily. 12/28/19   Billie Ruddy, MD  pantoprazole (PROTONIX) 40 MG tablet TAKE 1 TABLET BY MOUTH TWICE A DAY BEFORE A MEAL 06/22/19   Billie Ruddy, MD  potassium chloride SA (KLOR-CON) 20 MEQ tablet Take 1 tablet (20 mEq total) by mouth daily. 02/05/20   Billie Ruddy, MD  QUEtiapine (SEROQUEL) 200 MG tablet TAKE 1 TABLET BY MOUTH EVERY NIGHT AT BEDTIME Patient taking differently: Take 200 mg by mouth at bedtime.  01/17/20   Meredith Staggers, MD  senna-docusate (SENOKOT-S) 8.6-50 MG tablet Take 1 tablet by mouth 2 (two) times daily. 08/18/18   Love, Ivan Anchors, PA-C  silver sulfADIAZINE (SILVADENE) 1 % cream Apply 1 application topically 2 (two) times daily. 04/12/19   Laurey Morale, MD  sodium chloride (OCEAN) 0.65 % SOLN nasal spray Place 1 spray into both nostrils as needed for congestion. 08/18/18   Love, Ivan Anchors, PA-C  temazepam (RESTORIL) 30 MG capsule Take 1 capsule (30 mg total) by mouth at bedtime as needed for sleep. 02/12/20   Laurey Morale, MD  traMADol (ULTRAM) 50 MG tablet Take 1 tablet (50 mg total) by mouth every 12 (twelve)  hours as needed for severe pain (shouder pain). 04/12/19   Laurey Morale, MD  verapamil (CALAN-SR) 180 MG CR tablet Take 1 tablet (180 mg total) by mouth at bedtime. 02/12/20   Laurey Morale, MD    Scheduled Meds:  Infusions: . pantoprazole (PROTONIX) IV     PRN Meds:    Allergies as of 03/19/2020 - Review Complete 03/19/2020  Allergen Reaction Noted  . Penicillins Anaphylaxis 03/02/2011  . Sudafed [pseudoephedrine] Other (See Comments) 11/04/2014    Family History  Problem Relation Age of Onset  . Alzheimer's disease Father   . Cancer Mother        Lymph node  . Heart failure Brother 45       Transplant 13 years ago  . CAD Brother   . Heart disease Sister 38       MI    Social History   Socioeconomic History  . Marital status: Legally Separated    Spouse name: Not on file  . Number of children: 2  . Years of education: Not on file  . Highest education level: Not on file  Occupational History  . Not on file  Tobacco Use  . Smoking status: Current Every Day Smoker    Packs/day: 0.50    Years: 50.00    Pack years: 25.00    Types: Cigarettes  . Smokeless tobacco: Never Used  Vaping Use  . Vaping Use: Former  Substance and Sexual Activity  . Alcohol use: Yes    Alcohol/week: 0.0 standard drinks    Comment: occasionally  . Drug use: No    Frequency: 5.0 times per week  . Sexual activity: Yes  Other Topics Concern  . Not on file  Social History Narrative   One living child .  One  murdered.  Lives with girlfriend.     Social Determinants of Health   Financial Resource Strain:   . Difficulty of Paying Living Expenses: Not on file  Food Insecurity:   . Worried About Charity fundraiser in the Last Year: Not on file  . Ran Out of Food in the Last Year: Not on file  Transportation Needs:   . Lack of Transportation (Medical): Not on file  . Lack of Transportation (Non-Medical): Not on file  Physical Activity:   . Days of Exercise per Week: Not on file  .  Minutes of Exercise per Session: Not on file  Stress:   . Feeling of Stress : Not on file  Social Connections:   . Frequency of Communication with Friends and Family: Not on file  . Frequency of Social Gatherings with Friends and Family: Not on file  . Attends Religious Services: Not on file  . Active Member of Clubs or Organizations: Not on file  . Attends Archivist Meetings: Not on file  . Marital Status: Not on file  Intimate Partner Violence:   . Fear of Current or Ex-Partner: Not on file  . Emotionally Abused: Not on file  . Physically Abused: Not on file  . Sexually Abused: Not on file    REVIEW OF SYSTEMS: Constitutional: Fatigue.  Dizziness occurs when he gets up from sitting to standing. ENT:  No nose bleeds Pulm: No new dyspnea.  No cough CV:  No palpitations, no LE edema.  GU:  No hematuria, no frequency GI: See HPI Heme: Denies excessive or unusual bleeding or bruising. Transfusions: See HPI. Neuro: Dizziness with positional change.  No seizures, no syncope.  No headaches, no peripheral tingling or numbness Derm:  No itching, no rash or sores.  Endocrine:  No sweats or chills.  No polyuria or dysuria Immunization: Has been vaccinated for COVID-19 Travel:  None beyond local counties in last few months.    PHYSICAL EXAM: Vital signs in last 24 hours: Vitals:   03/19/20 1545 03/19/20 1600  BP:    Pulse:    Resp: 20 20  Temp:    SpO2:     Wt Readings from Last 3 Encounters:  03/05/20 87.1 kg  02/15/20 87.1 kg  02/12/20 88.3 kg    General: Chronically ill looking, somewhat frail but alert, comfortable gentleman who looks older than his stated age of 8 Head: No facial asymmetry or swelling.  No signs of head trauma. Eyes: Conjunctiva pale.  No scleral icterus. Ears: Not hard of hearing Nose: No congestion or discharge Mouth: Edentulous.  Mucosa is pink, moist, clear.  Tongue midline. Neck: No JVD, no thyromegaly, no masses Lungs: Clear  bilaterally without labored breathing.  No cough Heart: RRR.  No MRG.  S1, S2 present Abdomen: Soft without tenderness.  No masses, HSM, bruits, hernias.  Bowel sounds active..   Rectal: Deferred. Musc/Skeltl: Some arthritic changes in the hands/fingers.  Has a somewhat osteoporotic appearance. Extremities: Slight pitting pedal edema. Neurologic: Alert.  Oriented x3.  Moves all 4 limbs, strength not tested.  No tremors. Skin: No rashes, no sores on and complete dermatologic survey. Nodes: No cervical adenopathy Psych: Calm, cooperative, pleasant.  Intake/Output from previous day: No intake/output data recorded. Intake/Output this shift: No intake/output data recorded.  LAB RESULTS: Recent Labs    03/19/20 1515 03/19/20 1550  WBC 4.4  --   HGB 6.2* 6.5*  HCT 22.6* 19.0*  PLT 502*  --  BMET Lab Results  Component Value Date   NA 135 03/19/2020   NA 132 (L) 03/19/2020   NA 138 02/12/2020   K 4.1 03/19/2020   K 4.4 03/19/2020   K 3.5 02/12/2020   CL 106 03/19/2020   CL 103 03/19/2020   CL 109 02/12/2020   CO2 15 (L) 03/19/2020   CO2 22 02/12/2020   CO2 22 01/31/2020   GLUCOSE 96 03/19/2020   GLUCOSE 104 (H) 03/19/2020   GLUCOSE 110 (H) 02/12/2020   BUN 41 (H) 03/19/2020   BUN 50 (H) 03/19/2020   BUN 7 (L) 02/12/2020   CREATININE 5.50 (H) 03/19/2020   CREATININE 5.17 (H) 03/19/2020   CREATININE 0.76 02/12/2020   CALCIUM 8.4 (L) 03/19/2020   CALCIUM 8.8 (L) 02/12/2020   CALCIUM 9.4 01/31/2020   LFT Recent Labs    03/19/20 1515  PROT 6.5  ALBUMIN 3.8  AST 12*  ALT 9  ALKPHOS 86  BILITOT 0.7   PT/INR Lab Results  Component Value Date   INR 1.23 07/20/2018   INR 1.2 (H) 05/12/2016   INR 1.37 10/26/2015   Hepatitis Panel No results for input(s): HEPBSAG, HCVAB, HEPAIGM, HEPBIGM in the last 72 hours. C-Diff No components found for: CDIFF Lipase     Component Value Date/Time   LIPASE 23 02/12/2020 1322    Drugs of Abuse     Component Value  Date/Time   LABOPIA NONE DETECTED 07/20/2018 1638   COCAINSCRNUR NONE DETECTED 07/20/2018 1638   LABBENZ NONE DETECTED 07/20/2018 1638   AMPHETMU NONE DETECTED 07/20/2018 1638   THCU POSITIVE (A) 07/20/2018 1638   LABBARB NONE DETECTED 07/20/2018 1638     RADIOLOGY STUDIES: DG Chest Port 1 View  Result Date: 03/19/2020 CLINICAL DATA:  Hypertension EXAM: PORTABLE CHEST 1 VIEW COMPARISON:  None. FINDINGS: Lungs are well expanded, symmetric, and clear. No pneumothorax or pleural effusion. Cardiac size within normal limits. Moderate hiatal hernia, unchanged. Pulmonary vascularity is normal. Osseous structures are age-appropriate. No acute bone abnormality. IMPRESSION: 1. No acute cardiopulmonary findings. 2. Moderate hiatal hernia. Electronically Signed   By: Fidela Salisbury MD   On: 03/19/2020 15:42     IMPRESSION:   *   Acute on chronic Microcytic anemia/IDA from GI blood losses.  This despite taking po iron tid.   Source of blood loss is camerons lesions in large HH Pt has not been deemed a candidate for surgical repair of HH.    feraheme last given in 07/2018 Blood last given in 09/2019 Hgb of 7.1 is only down 1.1 g from the apex reading of 8.2 earlier this year.  *   Hypotension.   PLAN:     *   Needs regularly schedule doses of Feraheme. Would benefit from hematology mgt to follow his blood counts and manage feraheme, transfusions that could be done in the outpatient setting.  *   Repeat EGD??  *   Transfuse pt  *    If indeed he is not taking oral iron, begin this at 325 mg p.o. bid, same goes for Protonix which she should be taking 40 mg p.o. twice daily.  There is no need for IV Protonix while he is an inpatient.  Infuse Feraheme x1 and repeat this in a few days. Dr. Fuller Plan will see the patient but after discussing the case with him, he is not seeing a need to repeat EGD     Azucena Freed  03/19/2020, 4:23 PM Phone [phone]  Attending Physician Note   I have taken  a history, examined the patient and reviewed the chart. I agree with the Advanced Practitioner's note, impression and recommendations.  Impression: * Severe iron deficiency anemia, ferritin=2, from known Cameron erosions / ulcers associated with a large HH which noted on his EGD in 2019. Cameron erosions / ulcers typically cause chronic blood loss, chronic IDA that requires chronic iron replacement or HH repair.  * GERD with history of erosive esophagitis  * CKD might be contributing to anemia  Recommendation: * No plans to repeat EGD or colonoscopy at this time * Transfuse to Hgb > 7 * Feraheme IV now and again in 1 week * FeSO4 325 mg po bid with meals if 2nd dose of Feraheme is not guaranteed * Protonix 40 mg po qd long term * Hematology consultation as outpatient for long term mgmt of anemia * GI signing off. Outpatient GI follow up with Dr. Loletha Carrow in 2 months  Lucio Edward, MD [facility]

## 2020-03-19 NOTE — ED Notes (Signed)
Austen Oyster brother [phone] would like to have an update on the pt

## 2020-03-20 DIAGNOSIS — K257 Chronic gastric ulcer without hemorrhage or perforation: Secondary | ICD-10-CM

## 2020-03-20 DIAGNOSIS — I10 Essential (primary) hypertension: Secondary | ICD-10-CM

## 2020-03-20 DIAGNOSIS — D649 Anemia, unspecified: Secondary | ICD-10-CM

## 2020-03-20 DIAGNOSIS — Z720 Tobacco use: Secondary | ICD-10-CM

## 2020-03-20 LAB — BASIC METABOLIC PANEL
Anion gap: 7 (ref 5–15)
BUN: 40 mg/dL — ABNORMAL HIGH (ref 8–23)
CO2: 17 mmol/L — ABNORMAL LOW (ref 22–32)
Calcium: 8.4 mg/dL — ABNORMAL LOW (ref 8.9–10.3)
Chloride: 113 mmol/L — ABNORMAL HIGH (ref 98–111)
Creatinine, Ser: 3.02 mg/dL — ABNORMAL HIGH (ref 0.61–1.24)
GFR calc Af Amer: 23 mL/min — ABNORMAL LOW (ref >60)
GFR calc non Af Amer: 20 mL/min — ABNORMAL LOW (ref >60)
Glucose, Bld: 82 mg/dL (ref 70–99)
Potassium: 4.6 mmol/L (ref 3.5–5.1)
Sodium: 137 mmol/L (ref 135–145)

## 2020-03-20 LAB — CBC
HCT: 22.4 % — ABNORMAL LOW (ref 39.0–52.0)
HCT: 26.9 % — ABNORMAL LOW (ref 39.0–52.0)
Hemoglobin: 6.6 g/dL — CL (ref 13.0–17.0)
Hemoglobin: 7.8 g/dL — ABNORMAL LOW (ref 13.0–17.0)
MCH: 22.2 pg — ABNORMAL LOW (ref 26.0–34.0)
MCH: 22.3 pg — ABNORMAL LOW (ref 26.0–34.0)
MCHC: 29.5 g/dL — ABNORMAL LOW (ref 30.0–36.0)
MCHC: 29 g/dL — ABNORMAL LOW (ref 30.0–36.0)
MCV: 75.4 fL — ABNORMAL LOW (ref 80.0–100.0)
MCV: 77.1 fL — ABNORMAL LOW (ref 80.0–100.0)
Platelets: 479 10*3/uL — ABNORMAL HIGH (ref 150–400)
Platelets: 463 10*3/uL — ABNORMAL HIGH (ref 150–400)
RBC: 2.97 MIL/uL — ABNORMAL LOW (ref 4.22–5.81)
RBC: 3.49 MIL/uL — ABNORMAL LOW (ref 4.22–5.81)
RDW: 22.4 % — ABNORMAL HIGH (ref 11.5–15.5)
RDW: 22.6 % — ABNORMAL HIGH (ref 11.5–15.5)
WBC: 4.1 10*3/uL (ref 4.0–10.5)
WBC: 5.1 10*3/uL (ref 4.0–10.5)
nRBC: 0 % (ref 0.0–0.2)
nRBC: 0 % (ref 0.0–0.2)

## 2020-03-20 LAB — URINALYSIS, ROUTINE W REFLEX MICROSCOPIC
Bilirubin Urine: NEGATIVE
Glucose, UA: NEGATIVE mg/dL
Hgb urine dipstick: NEGATIVE
Ketones, ur: NEGATIVE mg/dL
Leukocytes,Ua: NEGATIVE
Nitrite: NEGATIVE
Protein, ur: NEGATIVE mg/dL
Specific Gravity, Urine: 1.01 (ref 1.005–1.030)
pH: 5 (ref 5.0–8.0)

## 2020-03-20 LAB — HIV ANTIBODY (ROUTINE TESTING W REFLEX): HIV Screen 4th Generation wRfx: NONREACTIVE

## 2020-03-20 LAB — URINE CULTURE: Culture: NO GROWTH

## 2020-03-20 NOTE — Progress Notes (Signed)
PROGRESS NOTE  Ronda Kazmi SNK:[id number] DOB: [birth year] DOA: 03/19/2020 PCP: Billie Ruddy, MD  HPI/Recap of past 24 hours: HPI from Dr Gypsy Balsam is a 69 y.o. male with medical history significant of CVA (2016); prostate CA; ischemic colitis (2012); HTN; HLD; and Cameron ulcer (2019) presenting with dizziness, hypotension.  He was taking HTN medication.  His HH aide came to check on him and his BP was low.  He denies feeling bad but was slightly dizzy.  He has received blood before, last time was in March.  No SOB with ambulation.  No blood in stools.  He is having normal BMs. ED Course: Symptomatic anemia causing hypotension.  H/o reflex esophagitis.  Presented with dizziness, found BP in 70s.  Takes 3 BP meds and hasn't been checking at home.  Pale, BP improved. Initially with septic work-up, but doubt this.  AKI, creatinine 5, BUN 50, Hgb 6, heme negative. Given blood, Protonix.  GI consulted. COVID pending neg. Patient admitted for further management.    Today, patient denies any new complaints, denies any evidence of bleeding, chest pain, abdominal pain, nausea/vomiting, fever/chills, melena, hematochezia.    Assessment/Plan: Principal Problem:   AKI (acute kidney injury) ([place]) Active Problems:   HTN (hypertension)   Tobacco abuse   Cameron ulcer, chronic   Acute on chronic anemia   Symptomatic acute on chronic blood loss anemia Iron deficiency anemia History of Cameron's erosions/ulcers associated with large HH Hemoglobin noted to be around 6 on admission, s/p 2 units of PRBC with appropriate response No evidence of bleeding Anemia panel showed iron 13, sats 3, ferritin 2, vitamin B12-->437 S/p Feraheme, plan to repeat again in 1 week GI on board, no plans to repeat EGD or colonoscopy at this time, Protonix long-term, recommend hematology outpatient consultation for long-term management of anemia (frequent iron infusion).  Outpatient follow-up Continue  Protonix, oral iron supplementation Daily CBC  AKI Baseline creatinine WNL, on admission 5.12 Improving on IV fluids, blood transfusion Continue to hold ACEI, diuretics Continue IV fluids Daily BMP  History of hypertension Noted hypotension on admission BP improving, continue to hold chlorthalidone, hydralazine, lisinopril, verapamil Monitor closely  Tobacco abuse Encourage cessation Continue nicotine patch  Mood disorder Continue as needed Xanax, Wellbutrin, Seroquel        Malnutrition Type:      Malnutrition Characteristics:      Nutrition Interventions:       Estimated body mass index is 28.35 kg/m as calculated from the following:   Height as of 03/05/20: 5\' 9"  (1.753 m).   Weight as of 03/05/20: 87.1 kg.     Code Status: Full  Family Communication: Discussed extensively with patient  Disposition Plan: Status is: Inpatient  Remains inpatient appropriate because:Inpatient level of care appropriate due to severity of illness   Dispo: The patient is from: Home              Anticipated d/c is to: Home              Anticipated d/c date is: 1 day              Patient currently is not medically stable to d/c.    Consultants:  GI  Procedures:  None  Antimicrobials:  None  DVT prophylaxis: SCDs   Objective: Vitals:   03/20/20 1115 03/20/20 1200 03/20/20 1230 03/20/20 1300  BP: 133/64 132/66 (!) 144/66 135/65  Pulse:      Resp: 17 (!) 32  17 19  Temp:      TempSrc:      SpO2:        Intake/Output Summary (Last 24 hours) at 03/20/2020 1333 Last data filed at 03/20/2020 3710 Gross per 24 hour  Intake 834.86 ml  Output --  Net 834.86 ml   There were no vitals filed for this visit.  Exam:  General: NAD   Cardiovascular: S1, S2 present  Respiratory: CTAB  Abdomen: Soft, nontender, nondistended, bowel sounds present  Musculoskeletal: No bilateral pedal edema noted  Skin: Normal  Psychiatry: Normal mood   Data  Reviewed: CBC: Recent Labs  Lab 03/19/20 1515 03/19/20 1550 03/19/20 2347 03/20/20 0900  WBC 4.4  --  4.1 5.1  NEUTROABS 2.8  --   --   --   HGB 6.2* 6.5* 6.6* 7.8*  HCT 22.6* 19.0* 22.4* 26.9*  MCV 73.6*  --  75.4* 77.1*  PLT 502*  --  479* 626*   Basic Metabolic Panel: Recent Labs  Lab 03/19/20 1515 03/19/20 1550 03/20/20 0900  NA 132* 135 137  K 4.4 4.1 4.6  CL 103 106 113*  CO2 15*  --  17*  GLUCOSE 104* 96 82  BUN 50* 41* 40*  CREATININE 5.17* 5.50* 3.02*  CALCIUM 8.4*  --  8.4*   GFR: CrCl cannot be calculated (Unknown ideal weight.). Liver Function Tests: Recent Labs  Lab 03/19/20 1515  AST 12*  ALT 9  ALKPHOS 86  BILITOT 0.7  PROT 6.5  ALBUMIN 3.8   No results for input(s): LIPASE, AMYLASE in the last 168 hours. No results for input(s): AMMONIA in the last 168 hours. Coagulation Profile: No results for input(s): INR, PROTIME in the last 168 hours. Cardiac Enzymes: No results for input(s): CKTOTAL, CKMB, CKMBINDEX, TROPONINI in the last 168 hours. BNP (last 3 results) No results for input(s): PROBNP in the last 8760 hours. HbA1C: No results for input(s): HGBA1C in the last 72 hours. CBG: No results for input(s): GLUCAP in the last 168 hours. Lipid Profile: No results for input(s): CHOL, HDL, LDLCALC, TRIG, CHOLHDL, LDLDIRECT in the last 72 hours. Thyroid Function Tests: No results for input(s): TSH, T4TOTAL, FREET4, T3FREE, THYROIDAB in the last 72 hours. Anemia Panel: Recent Labs    03/19/20 1634  VITAMINB12 437  FOLATE 14.0  FERRITIN 2*  TIBC 424  IRON 13*  RETICCTPCT 0.9   Urine analysis:    Component Value Date/Time   COLORURINE YELLOW 03/19/2020 2349   APPEARANCEUR CLEAR 03/19/2020 2349   LABSPEC 1.010 03/19/2020 2349   PHURINE 5.0 03/19/2020 2349   GLUCOSEU NEGATIVE 03/19/2020 2349   HGBUR NEGATIVE 03/19/2020 2349   BILIRUBINUR NEGATIVE 03/19/2020 2349   BILIRUBINUR n 02/13/2016 1044   KETONESUR NEGATIVE 03/19/2020 2349    PROTEINUR NEGATIVE 03/19/2020 2349   UROBILINOGEN 1.0 02/13/2016 1044   UROBILINOGEN 1.0 01/27/2012 0005   NITRITE NEGATIVE 03/19/2020 2349   LEUKOCYTESUR NEGATIVE 03/19/2020 2349   Sepsis Labs: @LABRCNTIP (procalcitonin:4,lacticidven:4)  ) Recent Results (from the past 240 hour(s))  Blood culture (routine x 2)     Status: None (Preliminary result)   Collection Time: 03/19/20  3:30 PM   Specimen: BLOOD  Result Value Ref Range Status   Specimen Description BLOOD SITE NOT SPECIFIED  Final   Special Requests   Final    BOTTLES DRAWN AEROBIC AND ANAEROBIC Blood Culture results may not be optimal due to an inadequate volume of blood received in culture bottles   Culture   Final  NO GROWTH < 24 HOURS Performed at [facility] [address], [place], [place] [address]    Report Status PENDING  Incomplete  Blood culture (routine x 2)     Status: None (Preliminary result)   Collection Time: 03/19/20  4:09 PM   Specimen: BLOOD  Result Value Ref Range Status   Specimen Description BLOOD LEFT ANTECUBITAL  Final   Special Requests   Final    BOTTLES DRAWN AEROBIC AND ANAEROBIC Blood Culture results may not be optimal due to an inadequate volume of blood received in culture bottles   Culture   Final    NO GROWTH < 24 HOURS Performed at [facility], [place] [address], [place], [place] [address]    Report Status PENDING  Incomplete  SARS Coronavirus 2 by RT PCR (hospital order, performed in [facility] hospital lab) Nasopharyngeal Nasopharyngeal Swab     Status: None   Collection Time: 03/19/20  4:15 PM   Specimen: Nasopharyngeal Swab  Result Value Ref Range Status   SARS Coronavirus 2 NEGATIVE NEGATIVE Final    Comment: (NOTE) SARS-CoV-2 target nucleic acids are NOT DETECTED.  The SARS-CoV-2 RNA is generally detectable in upper and lower respiratory specimens during the acute phase of infection. The lowest concentration of SARS-CoV-2 viral copies this assay can detect is  250 copies / mL. A negative result does not preclude SARS-CoV-2 infection and should not be used as the sole basis for treatment or other patient management decisions.  A negative result may occur with improper specimen collection / handling, submission of specimen other than nasopharyngeal swab, presence of viral mutation(s) within the areas targeted by this assay, and inadequate number of viral copies (<250 copies / mL). A negative result must be combined with clinical observations, patient history, and epidemiological information.  Fact Sheet for Patients:   StrictlyIdeas.no  Fact Sheet for Healthcare Providers: BankingDealers.co.za  This test is not yet approved or  cleared by the [place] FDA and has been authorized for detection and/or diagnosis of SARS-CoV-2 by FDA under an Emergency Use Authorization (EUA).  This EUA will remain in effect (meaning this test can be used) for the duration of the COVID-19 declaration under Section 564(b)(1) of the Act, 21 U.S.C. section 360bbb-3(b)(1), unless the authorization is terminated or revoked sooner.  Performed at [facility], [place] [address], [place], [place] [address]       Studies: DG Chest Port 1 View  Result Date: 03/19/2020 CLINICAL DATA:  Hypertension EXAM: PORTABLE CHEST 1 VIEW COMPARISON:  None. FINDINGS: Lungs are well expanded, symmetric, and clear. No pneumothorax or pleural effusion. Cardiac size within normal limits. Moderate hiatal hernia, unchanged. Pulmonary vascularity is normal. Osseous structures are age-appropriate. No acute bone abnormality. IMPRESSION: 1. No acute cardiopulmonary findings. 2. Moderate hiatal hernia. Electronically Signed   By: Fidela Salisbury MD   On: 03/19/2020 15:42    Scheduled Meds: . buPROPion  200 mg Oral Daily  . cyclobenzaprine  5 mg Oral BID  . nicotine  14 mg Transdermal Daily  . QUEtiapine  300 mg Oral QHS  . sodium chloride  flush  3 mL Intravenous Q12H    Continuous Infusions: . ferumoxytol Stopped (03/19/20 2107)  . lactated ringers 100 mL/hr at 03/20/20 0709  . pantoprozole (PROTONIX) infusion 8 mg/hr (03/20/20 0438)     LOS: 1 day     Alma Friendly, MD [facility]  If 7PM-7AM, please contact night-coverage [email or web address] 03/20/2020, 1:33 PM

## 2020-03-20 NOTE — Progress Notes (Signed)
New Admission Note:  Arrival Method:Bed from ER Mental Orientation: alert & oriented x4 Telemetry:32m05 Assessment: Completed Skin:dry & flaky IV: R forearm & L arm Pain:0/10 Safety Measures: Safety Fall Prevention Plan was given, discussed. 8H90: Patient has been orientated to the room, unit and the staff. Family:brother updated   Orders have been reviewed and implemented. Will continue to monitor the patient. Call light has been placed within reach and bed alarm has been activated.   Arta Silence ,RN

## 2020-03-20 NOTE — Evaluation (Signed)
Physical Therapy Evaluation Patient Details Name: [name] MRN: [id number] DOB: July 06, 1951 Today's Date: 03/20/2020   History of Present Illness  Pt is a 69 y.o. M with significant PMH of CVA (2016), prostate CA, sichemic colitis, HTN, who presents with dizziness and hypotension. Admitted with AKI and symptomatic anemia.   Clinical Impression  Prior to admission, pt lives in an apartment, is independent with ADL's, and uses a walker vs wheelchair for mobility. He has a HH aide 7 days/wk, 2 hours/day. Pt ambulating 15 feet with a walker at a min guard assist level. Pt endorses mild dizziness; BP 142/88, HR stable. Further distance limited due to chronic pain (L shoulder, BLE's). Would benefit from follow up HHPT to address deficits and maximize functional mobility.     Follow Up Recommendations Home health PT;Supervision for mobility/OOB    Equipment Recommendations  None recommended by PT    Recommendations for Other Services       Precautions / Restrictions Precautions Precautions: Fall Restrictions Weight Bearing Restrictions: No      Mobility  Bed Mobility Overal bed mobility: Needs Assistance Bed Mobility: Supine to Sit;Sit to Supine     Supine to sit: Supervision Sit to supine: Supervision   General bed mobility comments: HOB elevated  Transfers Overall transfer level: Needs assistance Equipment used: Rolling walker (2 wheeled) Transfers: Sit to/from Stand Sit to Stand: Min guard         General transfer comment: Min guard to rise; pt with + dizziness on 1st attempt and sat back down.   Ambulation/Gait Ambulation/Gait assistance: Min guard Gait Distance (Feet): 15 Feet Assistive device: Rolling walker (2 wheeled) Gait Pattern/deviations: Step-through pattern;Decreased dorsiflexion - right;Decreased dorsiflexion - left;Decreased stride length;Narrow base of support;Trunk flexed Gait velocity: decreased   General Gait Details: Min guard for safety,  trunk flexion throughout, no overt LOB.   Stairs            Wheelchair Mobility    Modified Rankin (Stroke Patients Only)       Balance Overall balance assessment: Needs assistance Sitting-balance support: Feet supported Sitting balance-Leahy Scale: Good     Standing balance support: Bilateral upper extremity supported Standing balance-Leahy Scale: Poor                               Pertinent Vitals/Pain Pain Assessment: Faces Faces Pain Scale: Hurts little more Pain Location: Chronic L shoulder, BLE pain Pain Descriptors / Indicators: Aching;Discomfort;Grimacing Pain Intervention(s): Limited activity within patient's tolerance;Monitored during session    Home Living Family/patient expects to be discharged to:: Private residence Living Arrangements: Alone Available Help at Discharge: Personal care attendant Type of Home: Apartment Home Access: Ramped entrance     Home Layout: One level Home Equipment: Clinical cytogeneticist - 2 wheels;Electric scooter Additional Comments: HH aide assists 7 days wk, 2 hours/day    Prior Function Level of Independence: Needs assistance   Gait / Transfers Assistance Needed: limited household ambulator, uses walker for household distances, Transport planner for community distances  ADL's / Homemaking Assistance Needed: independent ADL's, aide assists with IADL's        Hand Dominance        Extremity/Trunk Assessment   Upper Extremity Assessment Upper Extremity Assessment: RUE deficits/detail;LUE deficits/detail RUE Deficits / Details: Shoulder flexion AROM WFL LUE Deficits / Details: Shoulder flexion AROM to ~80 degrees, limited by pain    Lower Extremity Assessment Lower Extremity Assessment: Generalized weakness  Communication   Communication: No difficulties  Cognition Arousal/Alertness: Awake/alert Behavior During Therapy: WFL for tasks assessed/performed Overall Cognitive Status: Within  Functional Limits for tasks assessed                                        General Comments      Exercises     Assessment/Plan    PT Assessment Patient needs continued PT services  PT Problem List Decreased strength;Decreased activity tolerance;Decreased balance;Decreased mobility;Pain       PT Treatment Interventions DME instruction;Gait training;Therapeutic activities;Functional mobility training;Therapeutic exercise;Balance training;Patient/family education    PT Goals (Current goals can be found in the Care Plan section)  Acute Rehab PT Goals Patient Stated Goal: walk more PT Goal Formulation: With patient Time For Goal Achievement: 04/03/20 Potential to Achieve Goals: Good    Frequency Min 3X/week   Barriers to discharge        Co-evaluation               AM-PAC PT "6 Clicks" Mobility  Outcome Measure Help needed turning from your back to your side while in a flat bed without using bedrails?: None Help needed moving from lying on your back to sitting on the side of a flat bed without using bedrails?: None Help needed moving to and from a bed to a chair (including a wheelchair)?: A Little Help needed standing up from a chair using your arms (e.g., wheelchair or bedside chair)?: A Little Help needed to walk in hospital room?: A Little Help needed climbing 3-5 steps with a railing? : A Lot 6 Click Score: 19    End of Session Equipment Utilized During Treatment: Gait belt Activity Tolerance: Patient limited by pain Patient left: in bed;with call bell/phone within reach;with bed alarm set Nurse Communication: Mobility status PT Visit Diagnosis: Pain;Difficulty in walking, not elsewhere classified (R26.2);Muscle weakness (generalized) (M62.81) Pain - Right/Left: Left (both) Pain - part of body: Shoulder (legs)    Time: 4259-5638 PT Time Calculation (min) (ACUTE ONLY): 20 min   Charges:   PT Evaluation $PT Eval Moderate Complexity: 1  Mod            [name], PT, DPT Acute Rehabilitation Services Pager [phone] Office [phone]   [name] 03/20/2020, 2:34 PM

## 2020-03-20 NOTE — Telephone Encounter (Signed)
FYI Spoke with Radovan with [facility] verbal orders to extend PT /OT

## 2020-03-20 NOTE — Telephone Encounter (Signed)
[name] from [facility]  He needs verbal orders to extend PT for 1 week 4  To start 03/25/2020  Please advise

## 2020-03-20 NOTE — Evaluation (Signed)
Occupational Therapy Evaluation Patient Details Name: [name] MRN: [id number] DOB: [birth year] Today's Date: 03/20/2020    History of Present Illness Pt is a 69 y.o. M with significant PMH of CVA (2016), prostate CA, sichemic colitis, HTN, who presents with dizziness and hypotension. Admitted with AKI and symptomatic anemia.    Clinical Impression   Pt admitted with above. He demonstrates the below listed deficits and will benefit from continued OT to maximize safety and independence with BADLs.  Pt presents to OT with generalized weakness, impaired balance, Rt sided ataxia, as well as impaired cognition (which is likely baseline).  He requires min A for ADLs, and min guard assist to min A for functional mobility.  He lives alone with [facility] aide from 12-2 daily who assists him with IADLs.  He reports he is mod I with ADLs.   He reports a remote history of falls.   I really think he would benefit from SNF, as I am concerned about his balance and risk of falls/injury given he is alone the majority of the time, however, after discussing this with him, he is adamant about wanting to return home with [facility] services.        Follow Up Recommendations  Home health OT;Supervision - Intermittent - recommend direct assist when he is transferring, standing, ambulating.  Optimally feel he would benefit from SNF, but he refused this option with me.    Equipment Recommendations  None recommended by OT    Recommendations for Other Services       Precautions / Restrictions Precautions Precautions: Fall Restrictions Weight Bearing Restrictions: No      Mobility Bed Mobility Overal bed mobility: Needs Assistance Bed Mobility: Supine to Sit;Sit to Supine     Supine to sit: Supervision Sit to supine: Supervision   General bed mobility comments: HOB elevated  Transfers Overall transfer level: Needs assistance Equipment used: Rolling walker (2 wheeled) Transfers: Sit to/from [employer] Sit to Stand: Min guard Stand pivot transfers: Min assist       General transfer comment: Pt requires intermittent assist for balance. He reports he gets dizzy upon standing and is frequently afraid of falling when he stands     Balance Overall balance assessment: Needs assistance Sitting-balance support: Feet supported Sitting balance-Leahy Scale: Good     Standing balance support: Bilateral upper extremity supported Standing balance-Leahy Scale: Poor                             ADL either performed or assessed with clinical judgement   ADL Overall ADL's : Needs assistance/impaired Eating/Feeding: Modified independent;Sitting Eating/Feeding Details (indicate cue type and reason): Pt reports frequent spillage  Grooming: Wash/dry hands;Wash/dry face;Oral care;Brushing hair;Minimal assistance;Standing   Upper Body Bathing: Set up;Sitting   Lower Body Bathing: Minimal assistance;Sit to/from stand   Upper Body Dressing : Set up;Sitting   Lower Body Dressing: Moderate assistance;Sit to/from stand Lower Body Dressing Details (indicate cue type and reason): Pt requires assist for socks and shoes as he frequently looses balance to the Rt when attempting to don them sitting EOB.  He reports he does this task seated in w/c at home  Toilet Transfer: Minimal assistance;Ambulation;Comfort height toilet;RW   Toileting- Clothing Manipulation and Hygiene: Minimal assistance;Sit to/from stand       Functional mobility during ADLs: Minimal assistance;Rolling walker General ADL Comments: assist for balance      Vision  Perception     Praxis      Pertinent Vitals/Pain Pain Assessment: Faces Faces Pain Scale: Hurts little more Pain Location: Chronic L shoulder, BLE pain Pain Descriptors / Indicators: Aching;Discomfort;Grimacing Pain Intervention(s): Monitored during session     Hand Dominance Right   Extremity/Trunk Assessment Upper Extremity  Assessment Upper Extremity Assessment: LUE deficits/detail RUE Deficits / Details: Shoulder flexion AROM WFL; ataxia noted  LUE Deficits / Details: Shoulder flexion AROM to ~80 degrees, limited by pain - pt reports Lt UE limited by last stroke    Lower Extremity Assessment Lower Extremity Assessment: Defer to PT evaluation   Cervical / Trunk Assessment Cervical / Trunk Assessment: Other exceptions Cervical / Trunk Exceptions: Pt demonstrates truncal ataxia    Communication Communication Communication: No difficulties   Cognition Arousal/Alertness: Awake/alert Behavior During Therapy: WFL for tasks assessed/performed Overall Cognitive Status: No family/caregiver present to determine baseline cognitive functioning                                 General Comments: Pt requires assist with deductive reasoning, and provides some inconsistencies with responses    General Comments  pt reports he has declined functionally over the past year since he stopped rehab.  Discussed recommendation of SNF with pt, but he adamantly refuses this and wants to return home with follow therapies     Exercises     Shoulder Instructions      Home Living Family/patient expects to be discharged to:: Private residence Living Arrangements: Alone Available Help at Discharge: Personal care attendant Type of Home: Apartment Home Access: Ramped entrance     Home Layout: One level     Bathroom Shower/Tub: Teacher, early years/pre: [place]: Clinical cytogeneticist - 2 wheels;Electric scooter   Additional Comments: HH aide assists 7 days wk, 2 hours/day      Prior Functioning/Environment Level of Independence: Needs assistance  Gait / Transfers Assistance Needed: limited household ambulator, uses walker for household distances, Transport planner for community distances ADL's / Homemaking Assistance Needed: independent ADL's, aide assists with IADL's   Comments:  Pt reports aid is present from 12-2 daily and assists with IADLs         OT Problem List: Decreased strength;Decreased activity tolerance;Impaired balance (sitting and/or standing);Decreased coordination;Decreased cognition;Decreased safety awareness;Impaired UE functional use      OT Treatment/Interventions: Self-care/ADL training;Neuromuscular education;Therapeutic activities;Cognitive remediation/compensation;Patient/family education;Balance training    OT Goals(Current goals can be found in the care plan section) Acute Rehab OT Goals Patient Stated Goal: to get stronger and better balance  OT Goal Formulation: With patient Time For Goal Achievement: 04/03/20 Potential to Achieve Goals: Good ADL Goals Pt Will Perform Grooming: with modified independence;standing;sitting Pt Will Perform Upper Body Bathing: with modified independence;sitting Pt Will Perform Lower Body Bathing: with modified independence;sit to/from stand Pt Will Perform Upper Body Dressing: with modified independence;sitting Pt Will Perform Lower Body Dressing: with modified independence;sit to/from stand Pt Will Transfer to Toilet: with modified independence;ambulating;regular height toilet;bedside commode Pt Will Perform Toileting - Clothing Manipulation and hygiene: with modified independence;sit to/from stand  OT Frequency: Min 2X/week   Barriers to D/C: Decreased caregiver support          Co-evaluation              AM-PAC OT "6 Clicks" Daily Activity     Outcome Measure Help from another person eating meals?:  None Help from another person taking care of personal grooming?: A Little Help from another person toileting, which includes using toliet, bedpan, or urinal?: A Little Help from another person bathing (including washing, rinsing, drying)?: A Little Help from another person to put on and taking off regular upper body clothing?: A Little Help from another person to put on and taking off regular  lower body clothing?: A Little 6 Click Score: 19   End of Session Equipment Utilized During Treatment: Gait belt;Rolling walker Nurse Communication: Mobility status  Activity Tolerance: Patient tolerated treatment well Patient left: in bed;with call bell/phone within reach;with bed alarm set  OT Visit Diagnosis: Unsteadiness on feet (R26.81);Ataxia, unspecified (R27.0);Dizziness and giddiness (R42)                Time: 5643-3295 OT Time Calculation (min): 26 min Charges:  OT General Charges $OT Visit: 1 Visit OT Evaluation $OT Eval Moderate Complexity: 1 Mod OT Treatments $Self Care/Home Management : 8-22 mins  Nilsa Nutting., OTR/L Acute Rehabilitation Services Pager [phone] Office [place], [place] 03/20/2020, 4:36 PM

## 2020-03-20 NOTE — ED Notes (Signed)
Lab reports since blood finished, pt has to wait at least 2 hours before any blood draw, RN will continue to monitor

## 2020-03-20 NOTE — ED Notes (Signed)
Pt displaying no S/S of reaction, VSS stable , please see chart, infusion will be increased per pt toleration. RN will continue to monitor

## 2020-03-21 LAB — BPAM RBC
Blood Product Expiration Date: 202110042359
Blood Product Expiration Date: 202110042359
ISSUE DATE / TIME: 202109011737
ISSUE DATE / TIME: 202109020111
Unit Type and Rh: 5100
Unit Type and Rh: 5100

## 2020-03-21 LAB — TYPE AND SCREEN
ABO/RH(D): O POS
Antibody Screen: NEGATIVE
Unit division: 0
Unit division: 0

## 2020-03-21 LAB — BASIC METABOLIC PANEL
Anion gap: 9 (ref 5–15)
BUN: 26 mg/dL — ABNORMAL HIGH (ref 8–23)
CO2: 18 mmol/L — ABNORMAL LOW (ref 22–32)
Calcium: 8.8 mg/dL — ABNORMAL LOW (ref 8.9–10.3)
Chloride: 111 mmol/L (ref 98–111)
Creatinine, Ser: 1.84 mg/dL — ABNORMAL HIGH (ref 0.61–1.24)
GFR calc Af Amer: 42 mL/min — ABNORMAL LOW (ref >60)
GFR calc non Af Amer: 36 mL/min — ABNORMAL LOW (ref >60)
Glucose, Bld: 112 mg/dL — ABNORMAL HIGH (ref 70–99)
Potassium: 4.4 mmol/L (ref 3.5–5.1)
Sodium: 138 mmol/L (ref 135–145)

## 2020-03-21 LAB — CBC WITH DIFFERENTIAL/PLATELET
Abs Immature Granulocytes: 0.02 10*3/uL (ref 0.00–0.07)
Basophils Absolute: 0.1 10*3/uL (ref 0.0–0.1)
Basophils Relative: 1 %
Eosinophils Absolute: 0.1 10*3/uL (ref 0.0–0.5)
Eosinophils Relative: 1 %
HCT: 27.4 % — ABNORMAL LOW (ref 39.0–52.0)
Hemoglobin: 8 g/dL — ABNORMAL LOW (ref 13.0–17.0)
Immature Granulocytes: 0 %
Lymphocytes Relative: 16 %
Lymphs Abs: 0.9 10*3/uL (ref 0.7–4.0)
MCH: 22.2 pg — ABNORMAL LOW (ref 26.0–34.0)
MCHC: 29.2 g/dL — ABNORMAL LOW (ref 30.0–36.0)
MCV: 76.1 fL — ABNORMAL LOW (ref 80.0–100.0)
Monocytes Absolute: 0.5 10*3/uL (ref 0.1–1.0)
Monocytes Relative: 9 %
Neutro Abs: 4.3 10*3/uL (ref 1.7–7.7)
Neutrophils Relative %: 73 %
Platelets: 468 10*3/uL — ABNORMAL HIGH (ref 150–400)
RBC: 3.6 MIL/uL — ABNORMAL LOW (ref 4.22–5.81)
RDW: 22.4 % — ABNORMAL HIGH (ref 11.5–15.5)
WBC: 5.9 10*3/uL (ref 4.0–10.5)
nRBC: 0 % (ref 0.0–0.2)

## 2020-03-21 NOTE — Plan of Care (Signed)
  Problem: Education: Goal: Knowledge of General Education information will improve Description: Including pain rating scale, medication(s)/side effects and non-pharmacologic comfort measures Outcome: Progressing   Problem: Clinical Measurements: Goal: Cardiovascular complication will be avoided Outcome: Progressing   

## 2020-03-21 NOTE — Plan of Care (Signed)
  Problem: Education: Goal: Knowledge of General Education information will improve Description: Including pain rating scale, medication(s)/side effects and non-pharmacologic comfort measures 03/21/2020 1153 by Dolores Hoose, RN Outcome: Adequate for Discharge 03/21/2020 [id number] by Dolores Hoose, RN Outcome: Progressing   Problem: Health Behavior/Discharge Planning: Goal: Ability to manage health-related needs will improve Outcome: Adequate for Discharge   Problem: Clinical Measurements: Goal: Ability to maintain clinical measurements within normal limits will improve Outcome: Adequate for Discharge Goal: Will remain free from infection Outcome: Adequate for Discharge Goal: Diagnostic test results will improve Outcome: Adequate for Discharge Goal: Respiratory complications will improve Outcome: Adequate for Discharge Goal: Cardiovascular complication will be avoided 03/21/2020 1153 by Dolores Hoose, RN Outcome: Adequate for Discharge 03/21/2020 4584 by Dolores Hoose, RN Outcome: Progressing

## 2020-03-21 NOTE — Progress Notes (Signed)
DISCHARGE NOTE HOME [name] to be discharged Home per MD order. Discussed prescriptions and follow up appointments with the patient. Prescriptions given to patient; medication list explained in detail. Patient verbalized understanding.  Skin clean, dry and intact without evidence of skin break down, no evidence of skin tears noted. IV catheter discontinued intact. Site without signs and symptoms of complications. Dressing and pressure applied. Pt denies pain at the site currently. No complaints noted.  Patient free of lines, drains, and wounds.   An After Visit Summary (AVS) was printed and given to the patient. Patient escorted via wheelchair, and discharged home via private auto.  Dolores Hoose, RN

## 2020-03-21 NOTE — TOC Transition Note (Signed)
Transition of Care [facility]) - CM/SW Discharge Note   Patient Details  Name: [name] MRN: [id number] Date of Birth: 02-11-1951  [facility] CM/SW Contact:  Bartholomew Crews, RN Phone Number: [phone] 03/21/2020, 1:30 PM   Clinical Narrative:     Spoke with patient on his room phone. PTA has home care services with [facility]. Discussed recommendations for PT and OT. Patient stated that he is already receiving services, but could not recall an additional agency. NCM able to verify that he is active with [facility] for PT and OT. HH orders needed for resumption of services. No further TOC needs identified.   Final next level of care: [place] Barriers to Discharge: No Barriers Identified   Patient Goals and CMS Choice Patient states their goals for this hospitalization and ongoing recovery are:: return home CMS Medicare.gov Compare Post Acute Care list provided to:: Patient Choice offered to / list presented to : Patient  Discharge Placement                       Discharge Plan and Services In-house Referral: NA Discharge Planning Services: CM Consult Post Acute Care Choice: Home Health          DME Arranged: N/A DME Agency: NA       HH Arranged: PT, OT [place] Agency: [place] ([facility]) Date [place]: 03/21/20 Time [place]: 1330 Representative spoke with at [place]: [facility] ([place]) Interventions     Readmission Risk Interventions No flowsheet data found.

## 2020-03-21 NOTE — Discharge Summary (Signed)
Discharge Summary  [name] OZH:[id number] DOB: 12-Sep-1950  PCP: Billie Ruddy, MD  Admit date: 03/19/2020 Discharge date: 03/21/2020  Time spent: 40 mins  Recommendations for Outpatient Follow-up:  1. Follow-up with PCP as scheduled on 03/26/2020-for repeat labs and BP checks with adjustment of BP meds  Discharge Diagnoses:  Active Hospital Problems   Diagnosis Date Noted  . AKI (acute kidney injury) ([place]) 03/19/2020  . Acute on chronic anemia   . Cameron ulcer, chronic 01/11/2018  . Tobacco abuse   . HTN (hypertension) 04/10/2014    Resolved Hospital Problems  No resolved problems to display.    Discharge Condition: Stable  Diet recommendation: Heart healthy  Vitals:   03/21/20 0508 03/21/20 1005  BP: 139/68 (!) 161/78  Pulse: 64 69  Resp: 16 20  Temp: 98.4 F (36.9 C) 98.1 F (36.7 C)  SpO2: 98% 100%    History of present illness:   Jonathon Chhim Rountreeis a 69 y.o.malewith medical history significant ofCVA (2016); prostate CA; ischemic colitis (2012); HTN; HLD; and Cameron ulcer (2019) presenting with dizziness, hypotension.He was taking HTN medication. His HH aide came to check on him and his BP was low. He denies feeling bad but was slightly dizzy. He has received blood before, last time was in March. No SOB with ambulation. No blood in stools. He is having normal BMs. ED Course: Symptomatic anemia causing hypotension. H/o reflex esophagitis. Presented with dizziness, found BP in 70s. Takes 3 BP meds and hasn't been checking at home. Pale, BP improved. Initially with septic work-up, but doubt this. AKI, creatinine 5, BUN 50, Hgb 6, heme negative. Given blood, Protonix. GI consulted. COVID pending neg. Patient admitted for further management.    Today, patient denies any new complaints, denies any evidence of bleeding, chest pain, abdominal pain, nausea/vomiting, fever/chills, melena, hematochezia.  Patient very eager to be discharged as today  is his birthday and wants to be home.   Hospital Course:  Principal Problem:   AKI (acute kidney injury) ([place]) Active Problems:   HTN (hypertension)   Tobacco abuse   Cameron ulcer, chronic   Acute on chronic anemia   Symptomatic acute on chronic blood loss anemia Iron deficiency anemia History of Cameron's erosions/ulcers associated with large HH Hemoglobin noted to be around 6 on admission, s/p 2 units of PRBC with appropriate response-->8 Anemia panel showed iron 13, sats 3, ferritin 2, vitamin B12-->437 S/p Feraheme on 03/19/20, PCP please kindly schedule pt to receive the 2nd dose in 1 week and kindly check an anemia panel maybe every 6 months to see if he may benefit from frequent iron infusion GI consulted, no plans to repeat EGD or colonoscopy during the admission, rec Protonix long-term, and prn iron infusion Continue Protonix, oral iron supplementation Follow-up with PCP, with repeat labs Follow-up with GI as scheduled  AKI/metabolic acidosis Baseline creatinine WNL, on admission 5.12-->1.84 S/p IV fluids Continue to hold ACEI, diuretics Follow-up with PCP with repeat labs  History of hypertension Noted hypotension on admission BP stable, continue to hold chlorthalidone, lisinopril, verapamil pending reevaluation with PCP pending BP range and resolution of AKI Continue hydralazine for now Patient advised to keep log of his blood pressure readings  Thrombocytosis Likely reactive secondary to iron deficiency anemia Follow-up with repeat labs  History of CVA Continue aspirin Outpatient PT/OT  Tobacco abuse Encourage cessation  Mood disorder Continue as needed Xanax, Wellbutrin, Seroquel        Malnutrition Type:  Malnutrition Characteristics:      Nutrition Interventions:      Estimated body mass index is 27.64 kg/m as calculated from the following:   Height as of this encounter: 5\' 9"  (1.753 m).   Weight as of this encounter: 84.9  kg.    Procedures:  None  Consultations:  GI  Discharge Exam: BP (!) 161/78 (BP Location: Right Arm)   Pulse 69   Temp 98.1 F (36.7 C) (Oral)   Resp 20   Ht 5\' 9"  (1.753 m)   Wt 84.9 kg   SpO2 100%   BMI 27.64 kg/m   General: NAD Cardiovascular: S1, S2 present Respiratory: CTA B     Discharge Instructions You were cared for by a hospitalist during your hospital stay. If you have any questions about your discharge medications or the care you received while you were in the hospital after you are discharged, you can call the unit and asked to speak with the hospitalist on call if the hospitalist that took care of you is not available. Once you are discharged, your primary care physician will handle any further medical issues. Please note that NO REFILLS for any discharge medications will be authorized once you are discharged, as it is imperative that you return to your primary care physician (or establish a relationship with a primary care physician if you do not have one) for your aftercare needs so that they can reassess your need for medications and monitor your lab values.  Discharge Instructions    Diet - low sodium heart healthy   Complete by: As directed    Increase activity slowly   Complete by: As directed      Allergies as of 03/21/2020      Reactions   Penicillins Anaphylaxis   Has patient had a PCN reaction causing immediate rash, facial/tongue/throat swelling, SOB or lightheadedness with hypotension: Yes Has patient had a PCN reaction causing severe rash involving mucus membranes or skin necrosis: No Has patient had a PCN reaction that required hospitalization: No Has patient had a PCN reaction occurring within the last 10 years: No If all of the above answers are "NO", then may proceed with Cephalosporin use.[name] [pseudoephedrine] Other (See Comments)   Heart "races"      Medication List    STOP taking these medications   chlorthalidone 25 MG  tablet Commonly known as: HYGROTON   lisinopril 40 MG tablet Commonly known as: ZESTRIL   potassium chloride SA 20 MEQ tablet Commonly known as: KLOR-CON   verapamil 180 MG CR tablet Commonly known as: CALAN-SR     TAKE these medications   acetaminophen 325 MG tablet Commonly known as: TYLENOL Take 2 tablets (650 mg total) by mouth 4 (four) times daily -  with meals and at bedtime. What changed:   when to take this  reasons to take this   ALPRAZolam 0.25 MG tablet Commonly known as: XANAX Take 1 tablet (0.25 mg total) by mouth daily as needed for anxiety.   Blood Pressure Monitor/L Cuff Misc Use as directed daily.   buPROPion 200 MG 12 hr tablet Commonly known as: WELLBUTRIN SR TAKE 1 TABLET BY MOUTH DAILY   cyclobenzaprine 5 MG tablet Commonly known as: FLEXERIL TAKE 1 TABLET BY MOUTH TWICE A DAY AS NEEDED FOR MUSCLE SPASMS What changed: See the new instructions.   ferrous sulfate 325 (65 FE) MG tablet TAKE 1 TABLET BY MOUTH THREE TWICE A DAY WITH MEALS What changed:  how much to take  how to take this  when to take this  additional instructions   hydrALAZINE 25 MG tablet Commonly known as: APRESOLINE TAKE 1 TABLET BY MOUTH THREE TIMES A DAY   pantoprazole 40 MG tablet Commonly known as: PROTONIX TAKE 1 TABLET BY MOUTH TWICE A DAY BEFORE A MEAL What changed:   how much to take  how to take this  when to take this  additional instructions   QUEtiapine 200 MG tablet Commonly known as: SEROQUEL TAKE 1 TABLET BY MOUTH EVERY NIGHT AT BEDTIME   senna-docusate 8.6-50 MG tablet Commonly known as: Senokot-S Take 1 tablet by mouth 2 (two) times daily.   sodium chloride 0.65 % Soln nasal spray Commonly known as: OCEAN Place 1 spray into both nostrils as needed for congestion.   traMADol 50 MG tablet Commonly known as: ULTRAM Take 1 tablet (50 mg total) by mouth every 12 (twelve) hours as needed for severe pain (shouder pain).      Allergies   Allergen Reactions  . Penicillins Anaphylaxis    Has patient had a PCN reaction causing immediate rash, facial/tongue/throat swelling, SOB or lightheadedness with hypotension: Yes Has patient had a PCN reaction causing severe rash involving mucus membranes or skin necrosis: No Has patient had a PCN reaction that required hospitalization: No Has patient had a PCN reaction occurring within the last 10 years: No If all of the above answers are "NO", then may proceed with Cephalosporin use..  . Sudafed [Pseudoephedrine] Other (See Comments)    Heart "races"      The results of significant diagnostics from this hospitalization (including imaging, microbiology, ancillary and laboratory) are listed below for reference.    Significant Diagnostic Studies: DG Chest Port 1 View  Result Date: 03/19/2020 CLINICAL DATA:  Hypertension EXAM: PORTABLE CHEST 1 VIEW COMPARISON:  None. FINDINGS: Lungs are well expanded, symmetric, and clear. No pneumothorax or pleural effusion. Cardiac size within normal limits. Moderate hiatal hernia, unchanged. Pulmonary vascularity is normal. Osseous structures are age-appropriate. No acute bone abnormality. IMPRESSION: 1. No acute cardiopulmonary findings. 2. Moderate hiatal hernia. Electronically Signed   By: Fidela Salisbury MD   On: 03/19/2020 15:42    Microbiology: Recent Results (from the past 240 hour(s))  Urine Culture     Status: None   Collection Time: 03/19/20  1:22 AM   Specimen: Urine, Random  Result Value Ref Range Status   Specimen Description URINE, RANDOM  Final   Special Requests NONE  Final   Culture   Final    NO GROWTH Performed at [facility], [address], [place], [place] [address]    Report Status 03/20/2020 FINAL  Final  Blood culture (routine x 2)     Status: None (Preliminary result)   Collection Time: 03/19/20  3:30 PM   Specimen: BLOOD  Result Value Ref Range Status   Specimen Description BLOOD SITE NOT SPECIFIED  Final    Special Requests   Final    BOTTLES DRAWN AEROBIC AND ANAEROBIC Blood Culture results may not be optimal due to an inadequate volume of blood received in culture bottles   Culture   Final    NO GROWTH 2 DAYS Performed at [facility], [place] [address] [place], [place] [address]    Report Status PENDING  Incomplete  Blood culture (routine x 2)     Status: None (Preliminary result)   Collection Time: 03/19/20  4:09 PM   Specimen: BLOOD  Result Value Ref Range Status   Specimen Description BLOOD LEFT ANTECUBITAL  Final   Special Requests   Final    BOTTLES DRAWN AEROBIC AND ANAEROBIC Blood Culture results may not be optimal due to an inadequate volume of blood received in culture bottles   Culture   Final    NO GROWTH 2 DAYS Performed at [facility] [address], [place], [place] [address]    Report Status PENDING  Incomplete  SARS Coronavirus 2 by RT PCR (hospital order, performed in [facility] hospital lab) Nasopharyngeal Nasopharyngeal Swab     Status: None   Collection Time: 03/19/20  4:15 PM   Specimen: Nasopharyngeal Swab  Result Value Ref Range Status   SARS Coronavirus 2 NEGATIVE NEGATIVE Final    Comment: (NOTE) SARS-CoV-2 target nucleic acids are NOT DETECTED.  The SARS-CoV-2 RNA is generally detectable in upper and lower respiratory specimens during the acute phase of infection. The lowest concentration of SARS-CoV-2 viral copies this assay can detect is 250 copies / mL. A negative result does not preclude SARS-CoV-2 infection and should not be used as the sole basis for treatment or other patient management decisions.  A negative result may occur with improper specimen collection / handling, submission of specimen other than nasopharyngeal swab, presence of viral mutation(s) within the areas targeted by this assay, and inadequate number of viral copies (<250 copies / mL). A negative result must be combined with clinical observations, patient history, and  epidemiological information.  Fact Sheet for Patients:   StrictlyIdeas.no  Fact Sheet for Healthcare Providers: BankingDealers.co.za  This test is not yet approved or  cleared by the [place] FDA and has been authorized for detection and/or diagnosis of SARS-CoV-2 by FDA under an Emergency Use Authorization (EUA).  This EUA will remain in effect (meaning this test can be used) for the duration of the COVID-19 declaration under Section 564(b)(1) of the Act, 21 U.S.C. section 360bbb-3(b)(1), unless the authorization is terminated or revoked sooner.  Performed at [facility], [place] [address], [place], [place] [address]      Labs: Basic Metabolic Panel: Recent Labs  Lab 03/19/20 1515 03/19/20 1550 03/20/20 0900 03/21/20 0824  NA 132* 135 137 138  K 4.4 4.1 4.6 4.4  CL 103 106 113* 111  CO2 15*  --  17* 18*  GLUCOSE 104* 96 82 112*  BUN 50* 41* 40* 26*  CREATININE 5.17* 5.50* 3.02* 1.84*  CALCIUM 8.4*  --  8.4* 8.8*   Liver Function Tests: Recent Labs  Lab 03/19/20 1515  AST 12*  ALT 9  ALKPHOS 86  BILITOT 0.7  PROT 6.5  ALBUMIN 3.8   No results for input(s): LIPASE, AMYLASE in the last 168 hours. No results for input(s): AMMONIA in the last 168 hours. CBC: Recent Labs  Lab 03/19/20 1515 03/19/20 1550 03/19/20 2347 03/20/20 0900 03/21/20 0824  WBC 4.4  --  4.1 5.1 5.9  NEUTROABS 2.8  --   --   --  PENDING  HGB 6.2* 6.5* 6.6* 7.8* 8.0*  HCT 22.6* 19.0* 22.4* 26.9* 27.4*  MCV 73.6*  --  75.4* 77.1* 76.1*  PLT 502*  --  479* 463* 468*   Cardiac Enzymes: No results for input(s): CKTOTAL, CKMB, CKMBINDEX, TROPONINI in the last 168 hours. BNP: BNP (last 3 results) Recent Labs    01/31/20 1127  BNP 24    ProBNP (last 3 results) No results for input(s): PROBNP in the last 8760  hours.  CBG: No results for input(s): GLUCAP in the last 168 hours.     Signed:  Alma Friendly, MD [facility] 03/21/2020, 11:21 AM

## 2020-03-24 LAB — CULTURE, BLOOD (ROUTINE X 2)
Culture: NO GROWTH
Culture: NO GROWTH

## 2020-03-26 ENCOUNTER — Ambulatory Visit: Payer: Medicare HMO | Admitting: Family Medicine

## 2020-03-26 DIAGNOSIS — H409 Unspecified glaucoma: Secondary | ICD-10-CM | POA: Diagnosis not present

## 2020-03-26 DIAGNOSIS — G8929 Other chronic pain: Secondary | ICD-10-CM | POA: Diagnosis not present

## 2020-03-26 DIAGNOSIS — I10 Essential (primary) hypertension: Secondary | ICD-10-CM | POA: Diagnosis not present

## 2020-03-26 DIAGNOSIS — M1990 Unspecified osteoarthritis, unspecified site: Secondary | ICD-10-CM | POA: Diagnosis not present

## 2020-03-26 DIAGNOSIS — E782 Mixed hyperlipidemia: Secondary | ICD-10-CM | POA: Diagnosis not present

## 2020-03-26 DIAGNOSIS — Z9181 History of falling: Secondary | ICD-10-CM | POA: Diagnosis not present

## 2020-03-26 DIAGNOSIS — Z8673 Personal history of transient ischemic attack (TIA), and cerebral infarction without residual deficits: Secondary | ICD-10-CM | POA: Diagnosis not present

## 2020-03-26 DIAGNOSIS — K219 Gastro-esophageal reflux disease without esophagitis: Secondary | ICD-10-CM | POA: Diagnosis not present

## 2020-03-27 ENCOUNTER — Telehealth: Payer: Self-pay | Admitting: Family Medicine

## 2020-03-27 ENCOUNTER — Ambulatory Visit: Payer: Medicare HMO | Admitting: Family Medicine

## 2020-03-27 DIAGNOSIS — H409 Unspecified glaucoma: Secondary | ICD-10-CM | POA: Diagnosis not present

## 2020-03-27 DIAGNOSIS — Z9181 History of falling: Secondary | ICD-10-CM | POA: Diagnosis not present

## 2020-03-27 DIAGNOSIS — Z8673 Personal history of transient ischemic attack (TIA), and cerebral infarction without residual deficits: Secondary | ICD-10-CM | POA: Diagnosis not present

## 2020-03-27 DIAGNOSIS — K219 Gastro-esophageal reflux disease without esophagitis: Secondary | ICD-10-CM | POA: Diagnosis not present

## 2020-03-27 DIAGNOSIS — I10 Essential (primary) hypertension: Secondary | ICD-10-CM | POA: Diagnosis not present

## 2020-03-27 DIAGNOSIS — E782 Mixed hyperlipidemia: Secondary | ICD-10-CM | POA: Diagnosis not present

## 2020-03-27 DIAGNOSIS — M1990 Unspecified osteoarthritis, unspecified site: Secondary | ICD-10-CM | POA: Diagnosis not present

## 2020-03-27 DIAGNOSIS — G8929 Other chronic pain: Secondary | ICD-10-CM | POA: Diagnosis not present

## 2020-03-27 NOTE — Telephone Encounter (Signed)
[name] wants orders for physical therapy for:  1 x 3 weeks   [phone]

## 2020-03-27 NOTE — Telephone Encounter (Signed)
Both Rx were D/C at the ED on 03/21/2020

## 2020-03-28 NOTE — Telephone Encounter (Signed)
Okay for PT

## 2020-03-31 ENCOUNTER — Encounter: Payer: Self-pay | Admitting: Family Medicine

## 2020-03-31 ENCOUNTER — Other Ambulatory Visit: Payer: Self-pay

## 2020-03-31 ENCOUNTER — Ambulatory Visit (INDEPENDENT_AMBULATORY_CARE_PROVIDER_SITE_OTHER): Payer: Medicare HMO | Admitting: Family Medicine

## 2020-03-31 VITALS — BP 112/68 | HR 72 | Temp 98.6°F | Wt 179.0 lb

## 2020-03-31 DIAGNOSIS — N179 Acute kidney failure, unspecified: Secondary | ICD-10-CM | POA: Diagnosis not present

## 2020-03-31 DIAGNOSIS — L89322 Pressure ulcer of left buttock, stage 2: Secondary | ICD-10-CM

## 2020-03-31 DIAGNOSIS — D508 Other iron deficiency anemias: Secondary | ICD-10-CM

## 2020-03-31 DIAGNOSIS — M79604 Pain in right leg: Secondary | ICD-10-CM

## 2020-03-31 DIAGNOSIS — M25512 Pain in left shoulder: Secondary | ICD-10-CM | POA: Diagnosis not present

## 2020-03-31 DIAGNOSIS — I10 Essential (primary) hypertension: Secondary | ICD-10-CM | POA: Diagnosis not present

## 2020-03-31 DIAGNOSIS — M79605 Pain in left leg: Secondary | ICD-10-CM | POA: Diagnosis not present

## 2020-03-31 DIAGNOSIS — G8929 Other chronic pain: Secondary | ICD-10-CM

## 2020-03-31 DIAGNOSIS — R6889 Other general symptoms and signs: Secondary | ICD-10-CM | POA: Diagnosis not present

## 2020-03-31 NOTE — Patient Instructions (Addendum)
An appointment will be made for you to get your second iron infusion.  You will be called about scheduling this appointment.  Acute Kidney Injury, Adult  Acute kidney injury is a sudden worsening of kidney function. The kidneys are organs that have several jobs. They filter the blood to remove waste products and extra fluid. They also maintain a healthy balance of minerals and hormones in the body, which helps control blood pressure and keep bones strong. With this condition, your kidneys do not do their jobs as well as they should. This condition ranges from mild to severe. Over time it may develop into long-lasting (chronic) kidney disease. Early detection and treatment may prevent acute kidney injury from developing into a chronic condition. What are the causes? Common causes of this condition include:  A problem with blood flow to the kidneys. This may be caused by: ? Low blood pressure (hypotension) or shock. ? Blood loss. ? Heart and blood vessel (cardiovascular) disease. ? Severe burns. ? Liver disease.  Direct damage to the kidneys. This may be caused by: ? Certain medicines. ? A kidney infection. ? Poisoning. ? Being around or in contact with toxic substances. ? A surgical wound. ? A hard, direct hit to the kidney area.  A sudden blockage of urine flow. This may be caused by: ? Cancer. ? Kidney stones. ? An enlarged prostate in males. What are the signs or symptoms? Symptoms of this condition may not be obvious until the condition becomes severe. Symptoms of this condition can include:  Tiredness (lethargy), or difficulty staying awake.  Nausea or vomiting.  Swelling (edema) of the face, legs, ankles, or feet.  Problems with urination, such as: ? Abdominal pain, or pain along the side of your stomach (flank). ? Decreased urine production. ? Decrease in the force of urine flow.  Muscle twitches and cramps, especially in the legs.  Confusion or trouble  concentrating.  Loss of appetite.  Fever. How is this diagnosed? This condition may be diagnosed with tests, including:  Blood tests.  Urine tests.  Imaging tests.  A test in which a sample of tissue is removed from the kidneys to be examined under a microscope (kidney biopsy). How is this treated? Treatment for this condition depends on the cause and how severe the condition is. In mild cases, treatment may not be needed. The kidneys may heal on their own. In more severe cases, treatment will involve:  Treating the cause of the kidney injury. This may involve changing any medicines you are taking or adjusting your dosage.  Fluids. You may need specialized IV fluids to balance your body's needs.  Having a catheter placed to drain urine and prevent blockages.  Preventing problems from occurring. This may mean avoiding certain medicines or procedures that can cause further injury to the kidneys. In some cases treatment may also require:  A procedure to remove toxic wastes from the body (dialysis or continuous renal replacement therapy - CRRT).  Surgery. This may be done to repair a torn kidney, or to remove the blockage from the urinary system. Follow these instructions at home: Medicines  Take over-the-counter and prescription medicines only as told by your health care provider.  Do not take any new medicines without your health care provider's approval. Many medicines can worsen your kidney damage.  Do not take any vitamin and mineral supplements without your health care provider's approval. Many nutritional supplements can worsen your kidney damage. Lifestyle  If your health care provider prescribed  changes to your diet, follow them. You may need to decrease the amount of protein you eat.  Achieve and maintain a healthy weight. If you need help with this, ask your health care provider.  Start or continue an exercise plan. Try to exercise at least 30 minutes a day, 5 days  a week.  Do not use any tobacco products, such as cigarettes, chewing tobacco, and e-cigarettes. If you need help quitting, ask your health care provider. General instructions  Keep track of your blood pressure. Report changes in your blood pressure as told by your health care provider.  Stay up to date with immunizations. Ask your health care provider which immunizations you need.  Keep all follow-up visits as told by your health care provider. This is important. Where to find more information  American Association of Kidney Patients: BombTimer.gl  National Kidney Foundation: www.kidney.[place]: https://mathis.com/  Life Options [facility]: ? www.lifeoptions.org ? www.kidneyschool.org Contact a health care provider if:  Your symptoms get worse.  You develop new symptoms. Get help right away if:  You develop symptoms of worsening kidney disease, which include: ? Headaches. ? Abnormally dark or light skin. ? Easy bruising. ? Frequent hiccups. ? Chest pain. ? Shortness of breath. ? End of menstruation in women. ? Seizures. ? Confusion or altered mental status. ? Abdominal or back pain. ? Itchiness.  You have a fever.  Your body is producing less urine.  You have pain or bleeding when you urinate. Summary  Acute kidney injury is a sudden worsening of kidney function.  Acute kidney injury can be caused by problems with blood flow to the kidneys, direct damage to the kidneys, and sudden blockage of urine flow.  Symptoms of this condition may not be obvious until it becomes severe. Symptoms may include edema, lethargy, confusion, nausea or vomiting, and problems passing urine.  This condition can usually be diagnosed with blood tests, urine tests, and imaging tests. Sometimes a kidney biopsy is done to diagnose this condition.  Treatment for this condition often involves treating the underlying cause. It is treated with fluids, medicines,  dialysis, diet changes, or surgery. This information is not intended to replace advice given to you by your health care provider. Make sure you discuss any questions you have with your health care provider. Document Revised: 06/17/2017 Document Reviewed: 06/25/2016 Elsevier Patient Education  [place].  Iron Deficiency Anemia, Adult Iron-deficiency anemia is when you have a low amount of red blood cells or hemoglobin. This happens because you have too little iron in your body. Hemoglobin carries oxygen to parts of the body. Anemia can cause your body to not get enough oxygen. It may or may not cause symptoms. Follow these instructions at home: Medicines  Take over-the-counter and prescription medicines only as told by your doctor. This includes iron pills (supplements) and vitamins.  If you cannot handle taking iron pills by mouth, ask your doctor about getting iron through: ? A vein (intravenously). ? A shot (injection) into a muscle.  Take iron pills when your stomach is empty. If you cannot handle this, take them with food.  Do not drink milk or take antacids at the same time as your iron pills.  To prevent trouble pooping (constipation), eat fiber or take medicine (stool softener) as told by your doctor. Eating and drinking   Talk with your doctor before changing the foods you eat. He or she may tell you to eat foods that have a  lot of iron, such as: ? Liver. ? Lowfat (lean) beef. ? Breads and cereals that have iron added to them (fortified breads and cereals). ? Eggs. ? Dried fruit. ? Dark green, leafy vegetables.  Drink enough fluid to keep your pee (urine) clear or pale yellow.  Eat fresh fruits and vegetables that are high in vitamin C. They help your body to use iron. Foods with a lot of vitamin C include: ? Oranges. ? Peppers. ? Tomatoes. ? Mangoes. General instructions  Return to your normal activities as told by your doctor. Ask your doctor what  activities are safe for you.  Keep yourself clean, and keep things clean around you (your surroundings). Anemia can make you get sick more easily.  Keep all follow-up visits as told by your doctor. This is important. Contact a doctor if:  You feel sick to your stomach (nauseous).  You throw up (vomit).  You feel weak.  You are sweating for no clear reason.  You have trouble pooping, such as: ? Pooping (having a bowel movement) less than 3 times a week. ? Straining to poop. ? Having poop that is hard, dry, or larger than normal. ? Feeling full or bloated. ? Pain in the lower belly. ? Not feeling better after pooping. Get help right away if:  You pass out (faint). If this happens, do not drive yourself to the hospital. Call your local emergency services (911 in the [place]  You have chest pain.  You have shortness of breath that: ? Is very bad. ? Gets worse with physical activity.  You have a fast heartbeat.  You get light-headed when getting up from sitting or lying down. This information is not intended to replace advice given to you by your health care provider. Make sure you discuss any questions you have with your health care provider. Document Revised: 06/17/2017 Document Reviewed: 03/24/2016 Elsevier Patient Education  [place].  Managing Pain Without Opioids Opioids are strong medicines used to treat moderate to severe pain. For some people, especially those who have long-term (chronic) pain, opioids may not be the best choice for pain management due to:  Side effects like nausea, constipation, and sleepiness.  The risk of addiction (opioid use disorder). The longer you take opioids, the greater your risk of addiction. Pain that lasts for more than 3 months is called chronic pain. Managing chronic pain usually requires more than one approach and is often provided by a team of health care providers working together (multidisciplinary approach). Pain  management may be done at a pain management center or pain clinic. Types of pain management without opioids Managing pain without opioids can involve:  Non-opioid medicines.  Exercises to help relieve pain and improve strength and range of motion (physical therapy).  Therapy to help with everyday tasks and activities (occupational therapy).  Therapy to help you find ways to relieve pain by doing things you enjoy (recreational therapy).  Talk therapy (psychotherapy) and other mental health therapies.  Medical treatments such as injections or devices.  Making lifestyle changes. Pain management options Non-opioid medicines Non-opioid medicines for pain may include medicines taken by mouth (oral medicines), such as:  Over-the-counter or prescription NSAIDs. These may be the first medicines used for pain. They work well for muscle and bone pain, and they reduce swelling.  Acetaminophen. This over-the-counter medicine may work well for milder pain but not swelling.  Antidepressants. These may be used to treat chronic pain. A certain type of antidepressant (tricyclics) is  often used. These medicines are given in lower doses for pain than when used for depression.  Anticonvulsants. These are usually used to treat seizures but may also reduce nerve (neuropathic) pain.  Muscle relaxants. These relieve pain caused by sudden muscle tightening (spasms). You may also use a type of pain medicine that is applied to the skin as a patch, cream, or gel (topical analgesic), such as a numbing medicine. These may cause fewer side effects than oral medicines. Therapy Physical therapy involves doing exercises to gain strength and flexibility. A physical therapist may teach you exercises to move and stretch parts of your body that are weak, stiff, or painful. You can learn these exercises at physical therapy visits and practice them at home. Physical therapy may also involve:  Massage.  Heat wraps or  applying heat or cold to affected areas.  Sending electrical signals through the skin to interrupt pain signals (transcutaneous electrical nerve stimulation, TENS).  Sending weak lasers through the skin to reduce pain and swelling (low-level laser therapy).  Using signals from your body to help you learn to regulate pain (biofeedback). Occupational therapy helps you learn ways to function at home and work with less pain. Recreational therapy may involve trying new activities or hobbies, such as drawing or a physical activity. Types of mental health therapy for pain include:  Cognitive behavioral therapy (CBT) to help you learn coping skills for dealing with pain.  Acceptance and commitment therapy (ACT) to change the way you think and react to pain.  Relaxation therapies, including muscle relaxation exercises and focusing your mind on the present moment to lower stress (mindfulness-based stress reduction).  Pain management counseling. This may be individual, family, or group counseling.  Medical treatments Medical treatments for pain management include:  Nerve block injections. These may include a pain blocker and anti-inflammatory medicines. You may have injections: ? Near the spine to relieve chronic back or neck pain. ? Into joints to relieve back or joint pain. ? Into nerve areas that supply a painful area to relieve body pain. ? Into muscles (trigger point injections) to relieve some painful muscle conditions.  A medical device placed near your spine to help block pain signals and relieve nerve pain or chronic back pain (spinal cord stimulation device).  Acupuncture. Follow these instructions at home Medicines  Take over-the-counter and prescription medicines only as told by your health care provider.  If you are taking pain medicine, ask your health care providers about possible side effects to watch out for.  Do not drive or use heavy machinery while taking prescription  pain medicine. Lifestyle   Do not use drugs or alcohol to reduce pain. Limit alcohol intake to no more than 1 drink a day for nonpregnant women and 2 drinks a day for men. One drink equals 12 oz of beer, 5 oz of wine, or 1 oz of hard liquor.  Do not use any products that contain nicotine or tobacco, such as cigarettes and e-cigarettes. These can delay healing. If you need help quitting, ask your health care provider.  Eat a healthy diet and maintain a healthy weight. Poor diet and excess weight may make pain worse. ? Eat foods that are high in fiber. These include fresh fruits and vegetables, whole grains, and beans. ? Limit foods that are high in fat and processed sugars, such as fried and sweet foods.  Exercise regularly. Exercise lowers stress and may help relieve pain. ? Ask your health care provider what activities and  exercises are safe for you. ? If your health care provider approves, join an exercise class that combines movement and stress reduction. Examples include yoga and tai chi.  Get enough sleep. Lack of sleep may make pain worse.  Lower stress as much as possible. Practice stress reduction techniques as told by your therapist. General instructions  Work with all your pain management providers to find the treatments that work best for you. You are an important member of your pain management team. There are many things you can do to reduce pain on your own.  Consider joining an online or in-person support group for people who have chronic pain.  Keep all follow-up visits as told by your health care providers. This is important. Where to find more information You can find more information about managing pain without opioids from:  American Academy of Pain Medicine: painmed.[place] for Chronic Pain: instituteforchronicpain.org  American Chronic Pain Association: theacpa.org Contact a health care provider if:  You have side effects from pain medicine.  Your  pain gets worse or does not get better with treatments or home care.  You are struggling with anxiety or depression. Summary  Many types of pain can be managed without opioids. Chronic pain may respond better to pain management without opioids.  Pain is best managed with a team of providers working together.  Pain management without opioids may include non-opioid medicines, medical treatments, physical therapy, mental health therapy, and lifestyle changes.  Tell your health care providers if your pain gets worse or is not being managed well enough. This information is not intended to replace advice given to you by your health care provider. Make sure you discuss any questions you have with your health care provider. Document Revised: 03/28/2019 Document Reviewed: 04/26/2017 Elsevier Patient Education  [place].  Shoulder Pain Many things can cause shoulder pain, including:  An injury.  Moving the shoulder in the same way again and again (overuse).  Joint pain (arthritis). Pain can come from:  Swelling and irritation (inflammation) of any part of the shoulder.  An injury to the shoulder joint.  An injury to: ? Tissues that connect muscle to bone (tendons). ? Tissues that connect bones to each other (ligaments). ? Bones. Follow these instructions at home: Watch for changes in your symptoms. Let your doctor know about them. Follow these instructions to help with your pain. If you have a sling:  Wear the sling as told by your doctor. Remove it only as told by your doctor.  Loosen the sling if your fingers: ? Tingle. ? Become numb. ? Turn cold and blue.  Keep the sling clean.  If the sling is not waterproof: ? Do not let it get wet. ? Take the sling off when you shower or bathe. Managing pain, stiffness, and swelling   If told, put ice on the painful area: ? Put ice in a plastic bag. ? Place a towel between your skin and the bag. ? Leave the ice on for 20  minutes, 2-3 times a day. Stop putting ice on if it does not help with the pain.  Squeeze a soft ball or a foam pad as much as possible. This prevents swelling in the shoulder. It also helps to strengthen the arm. General instructions  Take over-the-counter and prescription medicines only as told by your doctor.  Keep all follow-up visits as told by your doctor. This is important. Contact a doctor if:  Your pain gets worse.  Medicine  does not help your pain.  You have new pain in your arm, hand, or fingers. Get help right away if:  Your arm, hand, or fingers: ? Tingle. ? Are numb. ? Are swollen. ? Are painful. ? Turn white or blue. Summary  Shoulder pain can be caused by many things. These include injury, moving the shoulder in the same away again and again, and joint pain.  Watch for changes in your symptoms. Let your doctor know about them.  This condition may be treated with a sling, ice, and pain medicine.  Contact your doctor if the pain gets worse or you have new pain. Get help right away if your arm, hand, or fingers tingle or get numb, swollen, or painful.  Keep all follow-up visits as told by your doctor. This is important. This information is not intended to replace advice given to you by your health care provider. Make sure you discuss any questions you have with your health care provider. Document Revised: 01/17/2018 Document Reviewed: 01/17/2018 Elsevier Patient Education  [place].  Preventing Iron Deficiency Anemia, Adult  Iron deficiency is having a lack of iron in the body. Iron is an important mineral that your body needs to build healthy red blood cells. Iron deficiency anemia is a condition in which the concentration of red blood cells or hemoglobin in the blood is below normal because of too little iron. Hemoglobin is a substance in red blood cells that carries oxygen to the body's tissues. You may develop iron deficiency anemia due to:  Blood  loss from an injury or condition such as Crohn's disease.  Your body being unable to properly absorb iron and use it to create red blood cells.  Lack of iron in your diet. You can prevent iron deficiency anemia by making certain changes to your diet and lifestyle. What nutrition changes can be made?  Eat foods that are high in iron, such as: ? Red meat, especially liver and beef. ? Poultry. ? Seafood. ? Dried fruit. ? Prune juice. ? Nuts. ? Pumpkin seeds. ? Beans. ? Leafy green vegetables. ? Molasses. ? Tofu.  Look for foods that have added iron (are fortified). Many cereals and breads are iron fortified.  Eat foods that contain vitamin C along with iron-rich foods, preferably in the same meal. Vitamin C increases your body's ability to absorb iron. Foods high in vitamin C include: ? Citrus fruits, such as lemons, oranges, and grapefruits. ? Berries. ? Bell peppers. ? Tomatoes. ? Broccoli.  Do not follow a diet that is very low in fat or very high in fiber.  Do not drink very large amounts of milk, tea, or coffee. What actions can I take to lower my risk? It is important to know whether you are at risk for iron deficiency anemia. Ask your health care provider if you need a blood test to measure your iron or red blood cells. You may have a higher risk for iron deficiency anemia if:  You are a woman and one of the following applies: ? You have heavy menstrual periods. ? You are pregnant. ? You are breastfeeding.  You have had bypass surgery for weight loss.  You have a digestive disorder such as Crohn's disease, irritable bowel syndrome, or celiac disease.  You regularly take antacids or acid-lowering medicines.  You follow a vegetarian or vegan diet. To lower your risk for iron deficiency:  If you are a vegetarian or vegan, talk with your health care provider or  a dietitian about: ? Taking an iron supplement. ? Adding more iron-rich foods to your diet.  Limit your  use of antacids or acid-lowering medicines.  If you have heavy menstrual periods, are pregnant, or are breastfeeding, ask your health care provider about taking an iron supplement.  Work with your health care provider to manage conditions that can cause iron deficiency.  Take over-the-counter and prescription medicines only as told by your health care provider.  Keep all follow-up visits as told by your health care provider. This is important. Why are these changes important? It is important to make these changes so that you do not develop iron deficiency anemia. Iron-rich foods help your body produce more red blood cells and have more energy. What can happen if changes are not made? If you do not make these changes, you could develop iron deficiency anemia. If not treated, iron deficiency anemia can lead to serious health complications, including:  Long-term (chronic) fatigue.  Shortness of breath.  Abnormal heart rhythms.  Heart failure.  Uncomfortable sensations and an overwhelming urge to move your legs (restless legs syndrome).  Weakened disease-fighting system (immune system). Where to find more information Learn more about preventing iron deficiency from:  [place], Lung, and [place]: https://wilson-eaton.com/  American Society of Hematology: www.hematology.org Contact a health care provider if you:  Develop symptoms of iron deficiency, including: ? Fatigue. ? Headache. ? Pale skin, lips, and nail beds. ? Poor appetite. ? Weakness. Summary  Iron deficiency anemia is a condition in which the concentration of red blood cells or hemoglobin in the blood is below normal because of too little iron.  You can help prevent iron deficiency anemia by eating more iron-rich foods, such as red meat, poultry, or tofu. Look for foods that have added iron (are fortified), such as cereals.  Eat foods that contain vitamin C along with iron-rich foods, preferably in the same  meal. Vitamin C increases the body's ability to absorb iron.  Ask your health care provider about your risk for iron deficiency anemia and whether a multivitamin or supplement may be right for you. This information is not intended to replace advice given to you by your health care provider. Make sure you discuss any questions you have with your health care provider. Document Revised: 10/27/2018 Document Reviewed: 05/05/2017 Elsevier Patient Education  2020 [employer].

## 2020-03-31 NOTE — Progress Notes (Signed)
Subjective:    Patient ID: [name], male    DOB: 07-22-1950, 69 y.o.   MRN: [id number]  No chief complaint on file.   HPI Pt is a 69 yo male with pmh sig for HTN, h/o CVA (2016) with residual R sided hemiparesis, tobacco use,L Rotator cuff syndrome, prostate cancer, anemia, Cameron ulcer, h/o Hep C, HLD, insomnia who was seen today for follow-up.  Patient admitted to hospital 9/1-9/3 for symptomatic anemia causing hypotension.  Hgb was 6 and heme negative.  GI consulted.  Pt received 2 units pRBCs with hgb increasing to 8.  Pt s/p feraheme infusion 9/1.  Protonix and po iron supplementation recommended.  AKI noted, creatinine was 5. Pt given IVFs.  Chlorthalidone, lisinopril, and verapamil held until f/u.  Pt continued on hydralazine.  Since d/c, pt continued all bp meds as he did not want his blood pressure to go up.  Pt states he still has a sore on his butt.  Pt endorses walking some, but mainly sitting in power wheelchair.  Pt notes pain in L shoulder and in b/l legs.  Shoulder pain keeps him up at night.  Using a heating pad eases the pain. Pt with LE weakness s/p CVA.  Has HH.   Past Medical History:  Diagnosis Date  . Arthritis   . Cameron ulcer, chronic 01/11/2018  . Chronic back pain   . GERD (gastroesophageal reflux disease)    uses Baking Soda  . GIB (gastrointestinal bleeding) 2012  . Glaucoma    right eye  . Headache    occasionally  . Hepatitis C   . Hiatal hernia with gastroesophageal reflux disease and esophagitis 01/05/2018  . History of blood transfusion    no abnormal reaction noted  . History of gout    doesn't take any meds  . Hyperlipidemia    not on any meds  . Hypertension    takes Amlodipine and Lisinopril daily  . Insomnia    takes Ambien nightly  . Ischemic colitis ([place]) 2012  . Joint pain   . Nocturia   . Numbness    both legs occasionally  . PONV (postoperative nausea and vomiting)   . Prostate cancer ([place])   . Shortness of breath dyspnea     rarely but when notices he can be lying/sitting/exertion.Dr.Hochrein is aware per pt  . Stroke ([place]) 2016   . Urinary frequency   . Urinary urgency     Allergies  Allergen Reactions  . Penicillins Anaphylaxis    Has patient had a PCN reaction causing immediate rash, facial/tongue/throat swelling, SOB or lightheadedness with hypotension: Yes Has patient had a PCN reaction causing severe rash involving mucus membranes or skin necrosis: No Has patient had a PCN reaction that required hospitalization: No Has patient had a PCN reaction occurring within the last 10 years: No If all of the above answers are "NO", then may proceed with Cephalosporin use..  . Sudafed [Pseudoephedrine] Other (See Comments)    Heart "races"    ROS General: Denies fever, chills, night sweats, changes in weight, changes in appetite +weakness HEENT: Denies headaches, ear pain, changes in vision, rhinorrhea, sore throat CV: Denies CP, palpitations, SOB, orthopnea Pulm: Denies SOB, cough, wheezing GI: Denies abdominal pain, nausea, vomiting, diarrhea, constipation GU: Denies dysuria, hematuria, frequency, vaginal discharge Msk: Denies muscle cramps, joint pains  +LE pain, L shoulder pain Neuro: Denies weakness, numbness, tingling Skin: Denies rashes, bruising  +sore on butt Psych: Denies depression, anxiety, hallucinations  Objective:    Blood pressure 112/68, pulse 72, temperature 98.6 F (37 C), temperature source Oral, weight 179 lb (81.2 kg), SpO2 98 %.  Gen. Pleasant, well-nourished, in no distress, normal affect   HEENT: [place]/AT, face symmetric, conjunctiva clear, no scleral icterus, PERRLA, EOMI, nares patent without drainage Lungs: no accessory muscle use, CTAB, no wheezes or rales Cardiovascular: RRR, no m/r/g, no peripheral edema Abdomen: BS present, soft, NT/ND Musculoskeletal: strength in b/l LEs 4/5. No TTP of L clavicle, AC joint, or scapula. +Hawkin's and Neer's.  Negative drop arm. No  deformities, no cyanosis or clubbing. Neuro:  A&Ox3, CN II-XII intact, normal gait Skin:  Warm, dry.  8 mm healing ulcer on L medial buttock  Wt Readings from Last 3 Encounters:  03/31/20 179 lb (81.2 kg)  03/20/20 187 lb 2.7 oz (84.9 kg)  03/05/20 192 lb (87.1 kg)    Lab Results  Component Value Date   WBC 5.9 03/21/2020   HGB 8.0 (L) 03/21/2020   HCT 27.4 (L) 03/21/2020   PLT 468 (H) 03/21/2020   GLUCOSE 112 (H) 03/21/2020   CHOL 102 07/22/2018   TRIG 108 07/22/2018   HDL 35 (L) 07/22/2018   LDLCALC 45 07/22/2018   ALT 9 03/19/2020   AST 12 (L) 03/19/2020   NA 138 03/21/2020   K 4.4 03/21/2020   CL 111 03/21/2020   CREATININE 1.84 (H) 03/21/2020   BUN 26 (H) 03/21/2020   CO2 18 (L) 03/21/2020   TSH 0.36 (L) 01/31/2020   INR 1.23 07/20/2018   HGBA1C 4.7 (L) 07/22/2018    Assessment/Plan:  Essential hypertension  -controlled -pt currently taking chlorthalidone 25 mg, lisinopril 40 mg, hydralazine 25 mg TID though advised to hold given recent AKI. -will repeat BMP.  If needed will d/c ACE and diuretic. -continue lifestyle modifications. -smoking cessation advised. - Plan: BMP with eGFR([facility]), BMP with eGFR([facility])  Pain in both lower extremities  - Plan: Ambulatory referral to Orthopedic Surgery, CBC with Differential/Platelet  Pressure injury of left buttock, stage 2 ([place]) -continue wound care with H/H -pt advised to avoid prolonged sitting when able -will continue to monitor  Chronic left shoulder pain  -h/o L rotator cuff syndrome - Plan: Ambulatory referral to Orthopedic Surgery  Other iron deficiency anemia  -likely 2/2 known h/o Cameron ulcer -H/H 6.2 and 22.6 on 03/19/20 -iron 13, TIBC 424, ferritin 2, folate 14 on 03/19/20 -increased to 8.0 s/p 2 units of pRBCs -continue f/u with GI -Consider heme/onc referral based on labs - Plan: CBC with Differential/Platelet  AKI (acute kidney injury) ([place])  -Creatinine 5.50 on 03/19/20-->1.84 on  03/21/20 -hydration encouraged. -will repeat labs -pt advised to hold lisinopril and chlorthalidone until lab results available.  Pt declines. - Plan: BMP with eGFR([facility])  F/u in 1 month  [name], MD

## 2020-04-01 ENCOUNTER — Telehealth: Payer: Self-pay | Admitting: Family Medicine

## 2020-04-01 LAB — CBC WITH DIFFERENTIAL/PLATELET
Absolute Monocytes: 578 cells/uL (ref 200–950)
Basophils Absolute: 41 cells/uL (ref 0–200)
Basophils Relative: 0.6 %
Eosinophils Absolute: 102 cells/uL (ref 15–500)
Eosinophils Relative: 1.5 %
HCT: 32 % — ABNORMAL LOW (ref 38.5–50.0)
Hemoglobin: 9.7 g/dL — ABNORMAL LOW (ref 13.2–17.1)
Lymphs Abs: 1149 cells/uL (ref 850–3900)
MCH: 23.8 pg — ABNORMAL LOW (ref 27.0–33.0)
MCHC: 30.3 g/dL — ABNORMAL LOW (ref 32.0–36.0)
MCV: 78.6 fL — ABNORMAL LOW (ref 80.0–100.0)
MPV: 8.8 fL (ref 7.5–12.5)
Monocytes Relative: 8.5 %
Neutro Abs: 4930 cells/uL (ref 1500–7800)
Neutrophils Relative %: 72.5 %
Platelets: 363 10*3/uL (ref 140–400)
RBC: 4.07 10*6/uL — ABNORMAL LOW (ref 4.20–5.80)
RDW: 24.3 % — ABNORMAL HIGH (ref 11.0–15.0)
Total Lymphocyte: 16.9 %
WBC: 6.8 10*3/uL (ref 3.8–10.8)

## 2020-04-01 LAB — BASIC METABOLIC PANEL WITH GFR
BUN: 14 mg/dL (ref 7–25)
CO2: 25 mmol/L (ref 20–32)
Calcium: 9.1 mg/dL (ref 8.6–10.3)
Chloride: 105 mmol/L (ref 98–110)
Creat: 1.18 mg/dL (ref 0.70–1.18)
GFR, Est African American: 72 mL/min/{1.73_m2} (ref >=60)
GFR, Est Non African American: 62 mL/min/{1.73_m2} (ref >=60)
Glucose, Bld: 88 mg/dL (ref 65–99)
Potassium: 3.9 mmol/L (ref 3.5–5.3)
Sodium: 138 mmol/L (ref 135–146)

## 2020-04-01 LAB — CBC MORPHOLOGY

## 2020-04-01 NOTE — Telephone Encounter (Signed)
[name] from [facility] needs verbal orders for Home Health PT 1w4?   [name] can be reached at [phone] to leave detailed message

## 2020-04-02 ENCOUNTER — Telehealth: Payer: Self-pay | Admitting: *Deleted

## 2020-04-02 DIAGNOSIS — D508 Other iron deficiency anemias: Secondary | ICD-10-CM

## 2020-04-02 NOTE — Telephone Encounter (Signed)
[name] stated per PCP the pt needs a second iron infusion.  Order was placed for hematology with note attached.

## 2020-04-03 DIAGNOSIS — H409 Unspecified glaucoma: Secondary | ICD-10-CM | POA: Diagnosis not present

## 2020-04-03 DIAGNOSIS — G8929 Other chronic pain: Secondary | ICD-10-CM | POA: Diagnosis not present

## 2020-04-03 DIAGNOSIS — Z8673 Personal history of transient ischemic attack (TIA), and cerebral infarction without residual deficits: Secondary | ICD-10-CM | POA: Diagnosis not present

## 2020-04-03 DIAGNOSIS — I10 Essential (primary) hypertension: Secondary | ICD-10-CM | POA: Diagnosis not present

## 2020-04-03 DIAGNOSIS — K219 Gastro-esophageal reflux disease without esophagitis: Secondary | ICD-10-CM | POA: Diagnosis not present

## 2020-04-03 DIAGNOSIS — E782 Mixed hyperlipidemia: Secondary | ICD-10-CM | POA: Diagnosis not present

## 2020-04-03 DIAGNOSIS — Z9181 History of falling: Secondary | ICD-10-CM | POA: Diagnosis not present

## 2020-04-03 DIAGNOSIS — M1990 Unspecified osteoarthritis, unspecified site: Secondary | ICD-10-CM | POA: Diagnosis not present

## 2020-04-04 ENCOUNTER — Telehealth: Payer: Self-pay | Admitting: Hematology and Oncology

## 2020-04-04 NOTE — Telephone Encounter (Signed)
Spoke with Charrise with [facility], provided verbal orders as requested

## 2020-04-04 NOTE — Telephone Encounter (Signed)
Scheduled per 9/17 referral msg. Called and spoke with pt, confirmed 9/28 appt. Pt made aware to arrive 30 mins before appt time and to also bring photo ID and insurance cards

## 2020-04-08 DIAGNOSIS — H409 Unspecified glaucoma: Secondary | ICD-10-CM | POA: Diagnosis not present

## 2020-04-08 DIAGNOSIS — G8929 Other chronic pain: Secondary | ICD-10-CM | POA: Diagnosis not present

## 2020-04-08 DIAGNOSIS — K219 Gastro-esophageal reflux disease without esophagitis: Secondary | ICD-10-CM | POA: Diagnosis not present

## 2020-04-08 DIAGNOSIS — Z8673 Personal history of transient ischemic attack (TIA), and cerebral infarction without residual deficits: Secondary | ICD-10-CM | POA: Diagnosis not present

## 2020-04-08 DIAGNOSIS — I10 Essential (primary) hypertension: Secondary | ICD-10-CM | POA: Diagnosis not present

## 2020-04-08 DIAGNOSIS — M1990 Unspecified osteoarthritis, unspecified site: Secondary | ICD-10-CM | POA: Diagnosis not present

## 2020-04-08 DIAGNOSIS — Z9181 History of falling: Secondary | ICD-10-CM | POA: Diagnosis not present

## 2020-04-08 DIAGNOSIS — E782 Mixed hyperlipidemia: Secondary | ICD-10-CM | POA: Diagnosis not present

## 2020-04-10 DIAGNOSIS — M1990 Unspecified osteoarthritis, unspecified site: Secondary | ICD-10-CM | POA: Diagnosis not present

## 2020-04-10 DIAGNOSIS — H409 Unspecified glaucoma: Secondary | ICD-10-CM | POA: Diagnosis not present

## 2020-04-10 DIAGNOSIS — Z9181 History of falling: Secondary | ICD-10-CM | POA: Diagnosis not present

## 2020-04-10 DIAGNOSIS — I10 Essential (primary) hypertension: Secondary | ICD-10-CM | POA: Diagnosis not present

## 2020-04-10 DIAGNOSIS — E782 Mixed hyperlipidemia: Secondary | ICD-10-CM | POA: Diagnosis not present

## 2020-04-10 DIAGNOSIS — K219 Gastro-esophageal reflux disease without esophagitis: Secondary | ICD-10-CM | POA: Diagnosis not present

## 2020-04-10 DIAGNOSIS — Z8673 Personal history of transient ischemic attack (TIA), and cerebral infarction without residual deficits: Secondary | ICD-10-CM | POA: Diagnosis not present

## 2020-04-10 DIAGNOSIS — G8929 Other chronic pain: Secondary | ICD-10-CM | POA: Diagnosis not present

## 2020-04-11 ENCOUNTER — Other Ambulatory Visit: Payer: Self-pay | Admitting: Physical Medicine & Rehabilitation

## 2020-04-11 DIAGNOSIS — F419 Anxiety disorder, unspecified: Secondary | ICD-10-CM

## 2020-04-11 DIAGNOSIS — I619 Nontraumatic intracerebral hemorrhage, unspecified: Secondary | ICD-10-CM

## 2020-04-11 DIAGNOSIS — F5101 Primary insomnia: Secondary | ICD-10-CM

## 2020-04-14 DIAGNOSIS — I10 Essential (primary) hypertension: Secondary | ICD-10-CM | POA: Diagnosis not present

## 2020-04-14 DIAGNOSIS — K219 Gastro-esophageal reflux disease without esophagitis: Secondary | ICD-10-CM | POA: Diagnosis not present

## 2020-04-14 DIAGNOSIS — E782 Mixed hyperlipidemia: Secondary | ICD-10-CM | POA: Diagnosis not present

## 2020-04-14 DIAGNOSIS — G8929 Other chronic pain: Secondary | ICD-10-CM | POA: Diagnosis not present

## 2020-04-14 DIAGNOSIS — H409 Unspecified glaucoma: Secondary | ICD-10-CM | POA: Diagnosis not present

## 2020-04-14 DIAGNOSIS — Z9181 History of falling: Secondary | ICD-10-CM | POA: Diagnosis not present

## 2020-04-14 DIAGNOSIS — Z8673 Personal history of transient ischemic attack (TIA), and cerebral infarction without residual deficits: Secondary | ICD-10-CM | POA: Diagnosis not present

## 2020-04-14 DIAGNOSIS — M1990 Unspecified osteoarthritis, unspecified site: Secondary | ICD-10-CM | POA: Diagnosis not present

## 2020-04-15 ENCOUNTER — Encounter: Payer: Medicare HMO | Admitting: Hematology and Oncology

## 2020-04-15 NOTE — Telephone Encounter (Signed)
Ok for PT 

## 2020-04-15 NOTE — Assessment & Plan Note (Deleted)
Lab review: 09/08/2016: Hemoglobin 10.9 01/10/18: Hb 5.5 01/12/2018: Hemoglobin 8.3, MCV 67.5, platelets 330 07/31/2018: Hemoglobin 7.5, MCV 81.7, platelets 363 09/19/2019: Hemoglobin 7.1, MCV 61, platelets 346 02/12/2020: Hemoglobin 7.9, MCV 73.8, platelets 445 03/19/2020: Hemoglobin 6.2, MCV 73.6, platelets 502 03/31/2020: Hemoglobin 9.7, MCV 78.6, platelets 363

## 2020-04-16 ENCOUNTER — Ambulatory Visit: Payer: Medicare HMO | Admitting: Orthopedic Surgery

## 2020-04-17 ENCOUNTER — Telehealth: Payer: Self-pay | Admitting: Family Medicine

## 2020-04-17 DIAGNOSIS — M1990 Unspecified osteoarthritis, unspecified site: Secondary | ICD-10-CM | POA: Diagnosis not present

## 2020-04-17 DIAGNOSIS — E782 Mixed hyperlipidemia: Secondary | ICD-10-CM | POA: Diagnosis not present

## 2020-04-17 DIAGNOSIS — I10 Essential (primary) hypertension: Secondary | ICD-10-CM | POA: Diagnosis not present

## 2020-04-17 DIAGNOSIS — H409 Unspecified glaucoma: Secondary | ICD-10-CM | POA: Diagnosis not present

## 2020-04-17 DIAGNOSIS — Z8673 Personal history of transient ischemic attack (TIA), and cerebral infarction without residual deficits: Secondary | ICD-10-CM | POA: Diagnosis not present

## 2020-04-17 DIAGNOSIS — Z9181 History of falling: Secondary | ICD-10-CM | POA: Diagnosis not present

## 2020-04-17 DIAGNOSIS — K219 Gastro-esophageal reflux disease without esophagitis: Secondary | ICD-10-CM | POA: Diagnosis not present

## 2020-04-17 DIAGNOSIS — G8929 Other chronic pain: Secondary | ICD-10-CM | POA: Diagnosis not present

## 2020-04-17 NOTE — Telephone Encounter (Signed)
Radovan with Occupational Therapist with [facility] needs verbal orders to continue OT 1x a week for 6 weeks beginning Oct 5th.  Ok to leave detailed message.

## 2020-04-21 NOTE — Telephone Encounter (Signed)
Spoke with Radovan with [facility], gave approved verbal orders for pt OT extension for 6 weeks more beginning October.

## 2020-04-24 DIAGNOSIS — I10 Essential (primary) hypertension: Secondary | ICD-10-CM | POA: Diagnosis not present

## 2020-04-24 DIAGNOSIS — E782 Mixed hyperlipidemia: Secondary | ICD-10-CM | POA: Diagnosis not present

## 2020-04-24 DIAGNOSIS — M549 Dorsalgia, unspecified: Secondary | ICD-10-CM | POA: Diagnosis not present

## 2020-04-24 DIAGNOSIS — H409 Unspecified glaucoma: Secondary | ICD-10-CM | POA: Diagnosis not present

## 2020-04-24 DIAGNOSIS — D500 Iron deficiency anemia secondary to blood loss (chronic): Secondary | ICD-10-CM | POA: Diagnosis not present

## 2020-04-24 DIAGNOSIS — M109 Gout, unspecified: Secondary | ICD-10-CM | POA: Diagnosis not present

## 2020-04-24 DIAGNOSIS — K2100 Gastro-esophageal reflux disease with esophagitis, without bleeding: Secondary | ICD-10-CM | POA: Diagnosis not present

## 2020-04-24 DIAGNOSIS — G8929 Other chronic pain: Secondary | ICD-10-CM | POA: Diagnosis not present

## 2020-04-24 DIAGNOSIS — M160 Bilateral primary osteoarthritis of hip: Secondary | ICD-10-CM | POA: Diagnosis not present

## 2020-05-01 DIAGNOSIS — I10 Essential (primary) hypertension: Secondary | ICD-10-CM | POA: Diagnosis not present

## 2020-05-01 DIAGNOSIS — G8929 Other chronic pain: Secondary | ICD-10-CM | POA: Diagnosis not present

## 2020-05-01 DIAGNOSIS — H409 Unspecified glaucoma: Secondary | ICD-10-CM | POA: Diagnosis not present

## 2020-05-01 DIAGNOSIS — K2100 Gastro-esophageal reflux disease with esophagitis, without bleeding: Secondary | ICD-10-CM | POA: Diagnosis not present

## 2020-05-01 DIAGNOSIS — M160 Bilateral primary osteoarthritis of hip: Secondary | ICD-10-CM | POA: Diagnosis not present

## 2020-05-01 DIAGNOSIS — E782 Mixed hyperlipidemia: Secondary | ICD-10-CM | POA: Diagnosis not present

## 2020-05-01 DIAGNOSIS — M109 Gout, unspecified: Secondary | ICD-10-CM | POA: Diagnosis not present

## 2020-05-01 DIAGNOSIS — D500 Iron deficiency anemia secondary to blood loss (chronic): Secondary | ICD-10-CM | POA: Diagnosis not present

## 2020-05-01 DIAGNOSIS — M549 Dorsalgia, unspecified: Secondary | ICD-10-CM | POA: Diagnosis not present

## 2020-05-06 ENCOUNTER — Other Ambulatory Visit: Payer: Self-pay | Admitting: Family Medicine

## 2020-05-06 DIAGNOSIS — I10 Essential (primary) hypertension: Secondary | ICD-10-CM

## 2020-05-07 DIAGNOSIS — G8929 Other chronic pain: Secondary | ICD-10-CM | POA: Diagnosis not present

## 2020-05-07 DIAGNOSIS — M160 Bilateral primary osteoarthritis of hip: Secondary | ICD-10-CM | POA: Diagnosis not present

## 2020-05-07 DIAGNOSIS — E782 Mixed hyperlipidemia: Secondary | ICD-10-CM | POA: Diagnosis not present

## 2020-05-07 DIAGNOSIS — M109 Gout, unspecified: Secondary | ICD-10-CM | POA: Diagnosis not present

## 2020-05-07 DIAGNOSIS — I10 Essential (primary) hypertension: Secondary | ICD-10-CM | POA: Diagnosis not present

## 2020-05-07 DIAGNOSIS — D500 Iron deficiency anemia secondary to blood loss (chronic): Secondary | ICD-10-CM | POA: Diagnosis not present

## 2020-05-07 DIAGNOSIS — K2100 Gastro-esophageal reflux disease with esophagitis, without bleeding: Secondary | ICD-10-CM | POA: Diagnosis not present

## 2020-05-07 DIAGNOSIS — H409 Unspecified glaucoma: Secondary | ICD-10-CM | POA: Diagnosis not present

## 2020-05-07 DIAGNOSIS — M549 Dorsalgia, unspecified: Secondary | ICD-10-CM | POA: Diagnosis not present

## 2020-05-08 ENCOUNTER — Encounter: Payer: Self-pay | Admitting: Physical Therapy

## 2020-05-08 ENCOUNTER — Ambulatory Visit: Payer: Medicare HMO | Attending: Family Medicine | Admitting: Physical Therapy

## 2020-05-08 ENCOUNTER — Other Ambulatory Visit: Payer: Self-pay

## 2020-05-08 DIAGNOSIS — R208 Other disturbances of skin sensation: Secondary | ICD-10-CM | POA: Diagnosis not present

## 2020-05-08 DIAGNOSIS — I69351 Hemiplegia and hemiparesis following cerebral infarction affecting right dominant side: Secondary | ICD-10-CM | POA: Insufficient documentation

## 2020-05-08 DIAGNOSIS — M25512 Pain in left shoulder: Secondary | ICD-10-CM | POA: Diagnosis not present

## 2020-05-08 DIAGNOSIS — R293 Abnormal posture: Secondary | ICD-10-CM | POA: Diagnosis not present

## 2020-05-08 DIAGNOSIS — R2681 Unsteadiness on feet: Secondary | ICD-10-CM | POA: Diagnosis not present

## 2020-05-08 DIAGNOSIS — G8929 Other chronic pain: Secondary | ICD-10-CM | POA: Diagnosis not present

## 2020-05-08 DIAGNOSIS — R2689 Other abnormalities of gait and mobility: Secondary | ICD-10-CM | POA: Diagnosis not present

## 2020-05-08 DIAGNOSIS — M6281 Muscle weakness (generalized): Secondary | ICD-10-CM | POA: Diagnosis not present

## 2020-05-08 DIAGNOSIS — R6889 Other general symptoms and signs: Secondary | ICD-10-CM | POA: Diagnosis not present

## 2020-05-08 NOTE — Therapy (Addendum)
[facility] [address] [place], [place], [address] Phone: [phone]   Fax:  [phone]  Physical Therapy Wheelchair Evaluation  Patient Details  Name: [name] MRN: [id number] Date of Birth: 23-Sep-1950 Referring Provider (PT): Billie Ruddy, MD   Encounter Date: 05/08/2020   PT End of Session - 05/08/20 1303    Visit Number 1    Number of Visits 1    Date for PT Re-Evaluation 25/42/70   90 day cert but 60 day poc   Authorization Type [employer] and Medicaid    PT Start Time 6237    PT Stop Time 1235    PT Time Calculation (min) 50 min    Equipment Utilized During Treatment --    Activity Tolerance Patient tolerated treatment well    Behavior During Therapy [facility] for tasks assessed/performed           Past Medical History:  Diagnosis Date  . Arthritis   . [place] ulcer, chronic 01/11/2018  . Chronic back pain   . GERD (gastroesophageal reflux disease)    uses Baking Soda  . GIB (gastrointestinal bleeding) 2012  . Glaucoma    right eye  . Headache    occasionally  . Hepatitis C   . Hiatal hernia with gastroesophageal reflux disease and esophagitis 01/05/2018  . History of blood transfusion    no abnormal reaction noted  . History of gout    doesn't take any meds  . Hyperlipidemia    not on any meds  . Hypertension    takes Amlodipine and Lisinopril daily  . Insomnia    takes Ambien nightly  . Ischemic colitis ([place]) 2012  . Joint pain   . Nocturia   . Numbness    both legs occasionally  . PONV (postoperative nausea and vomiting)   . Prostate cancer ([place])   . Shortness of breath dyspnea    rarely but when notices he can be lying/sitting/exertion.Dr.Hochrein is aware per pt  . Stroke ([place]) 2016   . Urinary frequency   . Urinary urgency     Past Surgical History:  Procedure Laterality Date  . APPENDECTOMY    . BIOPSY  01/11/2018   Procedure: BIOPSY;  Surgeon: Gatha Mayer, MD;  Location: [facility] ENDOSCOPY;  Service: Endoscopy;;  . COLONOSCOPY    . COLONOSCOPY N/A 10/26/2015   Procedure: COLONOSCOPY;  Surgeon: Doran Stabler, MD;  Location: [facility] ENDOSCOPY;  Service: Endoscopy;  Laterality: N/A;  . ESOPHAGOGASTRODUODENOSCOPY    . ESOPHAGOGASTRODUODENOSCOPY N/A 10/26/2015   Procedure: ESOPHAGOGASTRODUODENOSCOPY (EGD);  Surgeon: Doran Stabler, MD;  Location: [facility] ENDOSCOPY;  Service: Endoscopy;  Laterality: N/A;  . ESOPHAGOGASTRODUODENOSCOPY (EGD) WITH PROPOFOL N/A 01/11/2018   Procedure: ESOPHAGOGASTRODUODENOSCOPY (EGD) WITH PROPOFOL;  Surgeon: Gatha Mayer, MD;  Location: [facility] ENDOSCOPY;  Service: Endoscopy;  Laterality: N/A;  . INGUINAL HERNIA REPAIR Right 11/04/2014   Procedure: RIGHT INGUINAL HERNIA REPAIR WITH MESH;  Surgeon: Jackolyn Confer, MD;  Location: [place];  Service: General;  Laterality: Right;  . MASS EXCISION N/A 11/04/2014   Procedure: REMOVAL OF RIGHT GROIN SOFT TISSUE MASS;  Surgeon: Jackolyn Confer, MD;  Location: [facility];  Service: General;  Laterality: N/A;  . Multiple abdominal surgeries     total of 13  . PROSTATECTOMY    . SMALL INTESTINE SURGERY      There were no vitals filed for this visit.    Subjective Assessment - 05/08/20 1253  Subjective Pt presents to PT evaluation for power mobility assessment.  Pt arrives in power wheelchair that was loaned to him by a friend.  Pt has had to pay $800 out of pocket for repairs.    Pertinent History Right hemiparesis secondary to thalamic hemorrhage 07/20/18, chronic back pain, headaches, left shoulder pain, OA, HTN, insomnia, GI bleed, glaucoma R eye, hepatitis C, hiatal hernia with GERD, gout, HLD, ischemic colitis, prostate cancer, syncope, depression, L rotator cuff syndrome, symptomatic anemia causing hypotension    Currently in Pain? 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 07/20/18, chronic back pain, headaches, left shoulder pain, OA, HTN, insomnia, GI bleed, glaucoma R eye, hepatitis C, hiatal hernia with GERD, gout, HLD, ischemic colitis, prostate cancer, syncope, depression, L rotator cuff syndrome, symptomatic anemia causing hypotension    Required Braces or Orthoses Other Brace/Splint    Other Brace/Splint RLE foot up brace      Balance Screen   Has the patient fallen in the past 6 months No      Prior Function   Level of Independence Independent with household mobility with device;Needs assistance with homemaking             Mobility/Seating Evaluation    PATIENT INFORMATION: Name: [name] DOB: [birth year]  Sex: Male Date seen: 10.21.2021 Time: 11:45  Address:  [address]  [place] [address] Physician: Billie Ruddy, MD This evaluation/justification form will serve as the LMN for the following suppliers: __________________________ Supplier: [employer] Contact Person: Deberah Pelton, ATP Phone:  ?????   Seating Therapist: Misty Stanley, PT Phone:   [phone]   Phone: [phone]     Spouse/Parent/Caregiver name: Brother - Johnnette Litter or Sister - Ollen Barges  Phone number: Brother: [phone]; Sister: [phone] Insurance/Payer: [employer] HMO; Medicaid secondary     Reason for Referral: Power Mobility  Patient/Caregiver Goals: to maintain independent mobility  Patient was seen for face-to-face evaluation for new power wheelchair.  Also present was ATP to discuss recommendations and wheelchair options.  Further paperwork was completed and sent to vendor.  Patient appears to qualify for power mobility device at this time per objective findings.    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/20/18, chronic back pain, headaches, left shoulder pain, OA, HTN, insomnia, GI bleed, glaucoma R eye, hepatitis C, hiatal hernia with GERD, gout, HLD, ischemic colitis, prostate cancer, syncope, depression, L rotator cuff syndrome, symptomatic anemia causing hypotension     HOME ENVIRONMENT: _1 House  _2 Condo/town home  _3 Apartment  _4 Assisted Living    _5 Lives Alone _6   Lives with Others                                                                                          Hours with caregiver: Has an aide 2 hours/day  _0 Home is accessible to patient           Stairs      _1 Yes _2  No     Ramp _3 Yes _4 No Comments:  Has a handicap apartment; chair fits through all doorways   COMMUNITY ADL: TRANSPORTATION: _5 Car    _6 Van    <AQTMAUQJFHLKTGYB>_6<\/LSLHTDSKAJGOTLXB>_2 Public Transportation    _8 Adapted w/c Lift    _9 Ambulance    _10 Other:       _11 Sits in wheelchair during transport  Employment/School: ????? Specific requirements pertaining to mobility ?????  Other: Uses public transportation and also rides with brother in [place] car when he has appointments at the [place] outside [place]; brother not able to transport power wheelchair and must push patient in clinic provided manual wheelchair    FUNCTIONAL/SENSORY PROCESSING SKILLS:  Handedness:   _12 Right     _13 Left    _14 NA  Comments:  ?????  Functional Processing Skills for Wheeled Mobility _15 Processing Skills are adequate for safe wheelchair operation  Areas  of concern than may interfere with safe operation of wheelchair Description of problem   _16  Attention to environment      _17 Judgment      _18  Hearing  _19  Vision or visual processing      _20 Motor Planning  _21  Fluctuations in Behavior  ?????    VERBAL COMMUNICATION: _22 WFL receptive _23  WFL expressive _24 Understandable  _25 Difficult to understand  _26 non-communicative _27  Uses an augmented communication device  CURRENT SEATING / MOBILITY: Current Mobility Base:  _28 None _29 Dependent _30 Manual _31 Scooter _32 Power  Type of Control: joystick R  Manufacturer:  Friend loaned power wheelchair to patient after CVA Size:  ?????Age: ?????  Current Condition of Mobility Base:  Poor - has spent $800 out of pocket on repairs   Current Wheelchair components:  Captain's seat, flip up arm rests  Describe posture in present seating system:  Posterior pelvic tilt, rounded shoulders, LLE externally rotated and ABD, seat depth too short, knees higher than hips      SENSATION and SKIN ISSUES: Sensation _33 Intact  _34 Impaired _35 Absent  Level of sensation: Numbness in fingers and bilat feet and lower legs Pressure Relief: Able to perform effective pressure relief :    _36 Yes  _37  No Method: ????? If not, Why?: lacks sufficient balance to stand, lacks sufficient strength to boost with UE  Skin Issues/Skin Integrity Current Skin Issues  _38 Yes _39 No _40 Intact _41  Red area_42  Open Area  _43 Scar Tissue _44 At risk from prolonged sitting Where  R hip and sacrum  History of Skin Issues  _45 Yes _46 No Where  ????? When  ?????  Hx of skin flap surgeries  _47 Yes _48 No Where  ????? When  ?????  Limited sitting tolerance _49 Yes _50 No Hours spent sitting in wheelchair daily: must take intermittent breaks in bed or recliner  Complaint of Pain:  Please describe: L shoulder pain due to subluxation and bilat LE pain    Swelling/Edema: Bilat LE  ADL STATUS (in reference to wheelchair use):  Indep Assist Unable Indep with Equip Not  assessed Comments  Dressing _0  _1  _2  _3  _4  wheelchair level  Eating _5  _6  _7  _8  _9  wheelchair level  Toileting _10  _11  _12  _13  _14  transfers from wheelchair <> toilet  Bathing _15  _16  _17  _18  _19  transfers from wheelchair <> shower seat  Grooming/Hygiene _20  _21  _22  _23  _24  wheelchair level  Meal Prep _25  _26  _27  _28  _29  pt not safe to cook; aide performs coooking but pt can perform simple meal prep wheelchair level (sandwiches)  IADLS <VPXTGGYIRSWNIOEV>_0<\/JJKKXFGHWEXHBZJI>_96  _31  _32  _33  _34  ?????  Bowel Management: _35 Continent  _36 Incontinent  _37 Accidents Comments:  ?????  Bladder Management: _38 Continent  _39 Incontinent  _40 Accidents Comments:  ?????     WHEELCHAIR SKILLS: Manual w/c Propulsion: _41 UE or LE strength and endurance sufficient to participate in ADLs using manual wheelchair Arm : _42 left _43 right   _44 Both      Distance: ????? Foot:  _45 left _46 right   _47 Both  Operate Scooter: _48  Strength, hand grip, balance and transfer appropriate for use _49 Living environment is accessible for use of scooter  Operate Power w/c:  _50  Std. Joystick   _51  Alternative Controls Indep _52  Assist _53  Dependent/unable _54  N/A _55   _56 Safe          _57  Functional      Distance: ?????  Bed confined without wheelchair _58  Yes _59  No   STRENGTH/RANGE OF MOTION:  AROM Range of Motion Strength  Shoulder R: 115 deg flexion L: 50 deg flexion with pain R: 3+/5 L: 2-/5  Elbow WFL R: 4/5 L: 3+/5  Wrist/Hand WFL R: 4/5 L: 3+/5  Hip Limited hip extension bilaterally 3+/5 bilaterally  Knee Limited knee extension bilaterally 3-/5 bilaterally within available range  Ankle WFL  4-/5 bilaterally     MOBILITY/BALANCE:  _60  Patient is totally dependent for mobility  ?????    Balance Transfers Ambulation  Sitting Balance: Standing Balance: _61  Independent _62  Independent/Modified Independent  _63  WFL     _64  WFL _65  Supervision _66  Supervision  _67  Uses UE for balance  _68  Supervision _69  Min Assist _70  Ambulates with Assist  ?????    _71  Min Assist _72  Min assist _73  Mod  Assist _74  Ambulates with Device:      _75  RW  _76  StW  _77  Cane  _78  ?????  _79  Mod Assist _80  Mod assist _81  Max assist   _82  Max Assist _83  Max assist _84  Dependent _85  Indep. Short Distance Only  _86  Unable _87  Unable _88  Lift / Sling Required Distance (in feet)  ?????   _89  Sliding board _90  Unable to Ambulate (see explanation below)  Cardio Status:  _91 Intact  _92  Impaired   _93  NA     HTN  Respiratory Status:  _94 Intact   _95 Impaired   _96 NA     ?????  Orthotics/Prosthetics: None  Comments (Address manual vs power w/c vs scooter): Hillary Schwegler has a mobility deficit that limits his ability to safely perform MRADL's.  This mobility deficit cannot be remediated with a cane or walker.  Gabreil is non-ambulatory due to R hemiparesis, significant lower extremity weakness, limited knee extension ROM, LUE weakness, L shoulder subluxation, shoulder pain and impaired.  Donie is also not able to functionally propel a manual wheelchair due to R hemiparesis, significant UE weakness, left shoulder subluxation, left rotator cuff syndrome and shoulder pain.  Orlondo is not able to safely operate a power scooter (POV) due to lack of shoulder strength to drive a  tiller style propulsion system.  He also lacks sufficient standing balance to safely transfer on to and off a power scooter and lacks sufficient postural control to maintain safe sitting balance on a power scooter.  Drelyn requires the use of a group 2 power wheelchair with power tilt to safely perform MRADLs within his home environment, perform effective pressure relief and to reduce his risk for falls or injury.         Anterior / Posterior Obliquity Rotation-Pelvis ?????  PELVIS    _0  _1  _2   Neutral Posterior Anterior  _3  _4  _5   WFL Rt elev Lt elev  _6  _7  _8   WFL Right Left                      Anterior    Anterior     _9  Fixed _10  Other _11  Partly Flexible _12  Flexible   _13  Fixed _14  Other _15  Partly Flexible  _16  Flexible  _17  Fixed _18  Other _19  Partly  Flexible  _20  Flexible   TRUNK  _21  _22  _23   WFL ? Thoracic ? Lumbar  Kyphosis Lordosis  _24  _25  _26   WFL Convex Convex  Right Left _27 c-curve _28 s-curve _29 multiple  _30  Neutral _31  Left-anterior _32  Right-anterior     _33  Fixed _34  Flexible _35  Partly Flexible _36  Other  _37  Fixed _38  Flexible _39  Partly Flexible _40  Other  _41  Fixed             _42  Flexible _43  Partly Flexible _44  Other    Position Windswept  ?????  HIPS          _45            _46               _47    Neutral       Abduct        ADduct         _48           _49            _50   Neutral Right           Left      _51  Fixed _52  Subluxed _53  Partly Flexible _54  Dislocated _55  Flexible  _56  Fixed _57  Other _58  Partly Flexible  _59  Flexible                 Foot Positioning Knee Positioning  ?????    _60  WFL  _61 Lt _62 Rt _63  WFL  _64 Lt _65 Rt    KNEES ROM concerns: ROM concerns:    & Dorsi-Flexed _66 Lt _67 Rt Limited knee extension ROM    FEET Plantar Flexed _68 Lt _69 Rt      Inversion                 _70 Lt _71 Rt      Eversion                 _72 Lt _73 Rt     HEAD _74  Functional _75  Good Head Control  ?????  & _76  Flexed         _77  Extended _78  Adequate Head Control    NECK _79  Rotated  Lt  _80  Lat Flexed Lt _81  Rotated  Rt _82  Lat Flexed Rt _83  Limited Head Control     _84  Cervical Hyperextension _85  Absent  Head Control     SHOULDERS ELBOWS WRIST& HAND ?????      Left     Right    Left     Right  Left     Right   U/E _0 Functional           _1 Functional WFL WFL _2 Fisting             _3 Fisting      _4 elev   _5 dep      _6 elev   _7 dep       _8 pro -_9 retract     _10 pro  _11 retract _12 subluxed             _13 subluxed           Goals for Wheelchair Mobility  _14  Independence with mobility in the home with motor related ADLs (MRADLs)  _15  Independence with MRADLs in the community _16  Provide dependent mobility  _17  Provide recline     _18 Provide tilt   Goals for Seating system _19  Optimize pressure distribution _20  Provide support needed to facilitate  function or safety _21  Provide corrective forces to assist with maintaining or improving posture _22  Accommodate client's posture:   current seated postures and positions are not flexible or will not tolerate corrective forces _23  Client to be independent with relieving pressure in the wheelchair _24 Enhance physiological function such as breathing, swallowing, digestion  Simulation ideas/Equipment trials:????? State why other equipment was unsuccessful:?????   MOBILITY BASE RECOMMENDATIONS and JUSTIFICATION: MOBILITY COMPONENT JUSTIFICATION  Manufacturer: [employer] Model: Jay 4    Size: Width 16Seat Depth 20 _25 provide transport from point A to B      _26 promote Indep mobility  _27 is not a safe, functional ambulator _28 walker or cane inadequate _29 non-standard width/depth necessary to accommodate anatomical measurement _30  ?????  _31 Manual Mobility Base _32 non-functional ambulator    _33 Scooter/POV  _34 can safely operate  _35 can safely transfer   _36 has adequate trunk stability  _37 cannot functionally propel manual w/c  _38 Power Mobility Base  _39 non-ambulatory  _40 cannot functionally propel manual wheelchair  _41  cannot functionally and safely operate scooter/POV _42 can safely operate and willing to  _43 Stroller Base _44 infant/child  _45 unable to propel manual wheelchair _46 allows for growth _47 non-functional ambulator _48 non-functional UE _49 Indep mobility is not a goal at this time  _50 Tilt  _51 Forward _52 Backward _53 Powered tilt  _54 Manual tilt  _55 change position against gravitational force on head and shoulders  _56 change position for pressure relief/cannot weight shift _57 transfers  _58 management of tone _59 rest periods _60 control edema _61 facilitate postural control  _62  ?????  _63 Recline  _64 Power recline on power base _65 Manual recline on manual base  _66 accommodate femur to back angle  _67 bring to full recline for ADL care  _68 change position for pressure relief/cannot weight shift _69 rest  periods _70 repositioning for transfers or clothing/diaper /catheter changes _71 head positioning  _72 Lighter weight required _73 self- propulsion  _74 lifting _75  ?????  _76 Heavy Duty required _77 user weight greater than 250# _78 extreme tone/ over active movement _79 broken frame on previous chair _80  ?????  _81  Back  _82  Angle Adjustable _83  Custom molded ????? _84 postural control _85 control of tone/spasticity _86 accommodation of range of motion _87 UE functional control _88 accommodation for seating system _89  ????? _90 provide lateral trunk support _91 accommodate deformity _92 provide posterior trunk support _93 provide lumbar/sacral support _94 support trunk in midline _95 Pressure relief over spinal processes  _96  Seat Cushion Glacial SP Gel Cushion _97 impaired sensation  _98 decubitus ulcers present _99 history of pressure ulceration _100 prevent pelvic extension _101 low maintenance  _102 stabilize pelvis  _103 accommodate obliquity _104 accommodate multiple deformity _105 neutralize lower extremity position _106 increase pressure distribution _107  ?????  _108  Pelvic/thigh support  _109  Lateral thigh guide _110  Distal medial pad  _111  Distal lateral pad _112  pelvis in neutral _113 accommodate pelvis _114  position upper legs _115  alignment _116  accommodate  ROM _0  decr adduction _1 accommodate tone _2 removable for transfers _3 decr abduction  _4  Lateral trunk Supports _5  Lt     _6  Rt _7 decrease lateral trunk leaning _8 control tone _9 contour for increased contact _10 safety  _11 accommodate asymmetry _12  ?????  _13  Mounting hardware  _14 lateral trunk supports  _15 back   _16 seat _17 headrest      _18  thigh support _19 fixed   _20 swing away _21 attach seat platform/cushion to w/c frame _22 attach back cushion to w/c frame _23 mount postural supports _24 mount headrest  _25 swing medial thigh support away _26 swing lateral supports away for transfers  _27  ?????    Armrests  _28 fixed _29 adjustable height _30 removable   _31 swing away  _32 flip back    _33 reclining _34 full length pads _35 desk    _36 pads tubular  _37 provide support with elbow at 90   _38 provide support for w/c tray _39 change of height/angles for variable activities _40 remove for transfers _41 allow to come closer to table top _42 remove for access to tables _43  ?????  Hangers/ Leg rests  _44 60 _45 70 _46 90 _47 elevating _48 heavy duty  _49 articulating _50 fixed _51 lift off _52 swing away     _53 power _54 provide LE support  _55 accommodate to hamstring tightness _56 elevate legs during recline   _57 provide change in position for Legs _58 Maintain placement of feet on footplate _59 durability _60 enable transfers _61 decrease edema _62 Accommodate lower leg length _63  ?????  Foot support Footplate    <SEGBTDVVOHYWVPXT>_0<\/GYIRSWNIOEVOJJKK>_93 Lt  _65  Rt  _66  Center mount _67 flip up     _68 depth/angle adjustable _69 Amputee adapter    _70  Lt     _71  Rt _72 provide foot support _73 accommodate to ankle ROM _74 transfers _75 Provide support for residual extremity _76  allow foot to go under wheelchair base _77  decrease tone  _78  ?????  _79  Ankle strap/heel loops _80 support foot on foot support _81 decrease extraneous movement _82 provide input to heel  _83 protect foot  Tires: _84 pneumatic  _85 flat free inserts  _86 solid  _87 decrease maintenance  _88 prevent frequent flats _89 increase shock absorbency _90 decrease pain from road shock _91 decrease spasms from road shock _92  ?????  _93  Headrest  _94 provide posterior head support _95 provide posterior neck support _96 provide lateral head support _97 provide anterior head support _98 support during tilt and recline _99 improve feeding   _100 improve respiration _101 placement of switches _102 safety  _103 accommodate ROM  _104 accommodate tone _105 improve visual orientation  _106  Anterior chest strap _107  Vest _108  Shoulder retractors  _109 decrease forward movement of shoulder _110 accommodation of TLSO _111 decrease forward movement of trunk _112 decrease shoulder elevation _113 added abdominal support _114 alignment _115 assistance with shoulder control  _116   ?????  Pelvic Positioner _117 Belt _118 SubASIS bar _119 Dual Pull _120 stabilize tone _121 decrease falling out of chair/ **will not Decr potential for sliding due to pelvic tilting _122 prevent excessive rotation _123 pad for protection over boney prominence _124 prominence comfort _125 special pull angle to control rotation _126  ?????  Upper Extremity Support _127 L   _128  R _129 Arm trough    _130 hand support _131  tray       _132 full tray _133 swivel mount _134 decrease edema      _135 decrease subluxation   _136 control tone   _137 placement for AAC/Computer/EADL _138 decrease gravitational pull on shoulders _139 provide midline positioning _140 provide support to increase UE function _141 provide hand support in natural position _142 provide work surface   POWER WHEELCHAIR CONTROLS  _143 Proportional  _144 Non-Proportional Type Joystick _145 Left  _146 Right _147 provides access for controlling wheelchair   _148 lacks motor control to operate proportional drive control <GHWEXHBZJIRCVELF>_8<\/BOFBPZWCHENIDPOE>_423 unable to understand proportional controls  Actuator Control Module  _150 Single  _151 Multiple   _152 Allow the client to operate the power seat function(s) through the joystick control   _153 Safety Reset Switches _154 Used to change  modes and stop the wheelchair when driving in latch mode    _0 Upgraded Electronics   _1 programming for accurate control _2 progressive Disease/changing condition _3 non-proportional drive control needed _4 Needed in order to operate power seat functions through joystick control   _5 Display box _6 Allows user to see in which mode and drive the wheelchair is set  _7 necessary for alternate controls    _8 Digital interface electronics _9 Allows w/c to operate when using alternative drive controls  <NATFTDDUKGURKYHC>_6<\/CBJSEGBTDVVOHYWV>_37 ASL Head Array _11 Allows client to operate wheelchair  through switches placed in tri-panel headrest  _12 Sip and puff with tubing kit _13 needed to operate sip and puff drive controls  <TGGYIRSWNIOEVOJJ>_0<\/KXFGHWEXHBZJIRCV>_89 Upgraded tracking electronics _15 increase safety when driving <FYBOFBPZWCHENIDP>_8<\/EUMPNTIRWERXVQMG>_86 correct tracking when on uneven  surfaces  _17 [facility] for switches or joystick _18 Attaches switches to w/c  _19 Swing away for access or transfers _20 midline for optimal placement _21 provides for consistent access  _22 Attendant controlled joystick plus mount _23 safety _24 long distance driving <PYPPJKDTOIZTIWPY>_0<\/DXIPJASNKNLZJQBH>_41 operation of seat functions _26 compliance with transportation regulations _27  ?????    Rear wheel placement/Axle adjustability _28 None _29 semi adjustable _30 fully adjustable  _31 improved UE access to wheels _32 improved stability _33 changing angle in space for improvement of postural stability _34 1-arm drive access <PFXTKWIOXBDZHGDJ>_2<\/EQASTMHDQQIWLNLG>_92 amputee pad placement _36  ?????  Wheel rims/ hand rims  _37 metal  _38 plastic coated _39 oblique projections _40 vertical projections _41 Provide ability to propel manual wheelchair  _42  Increase self-propulsion with hand weakness/decreased grasp  Push handles _43 extended  _44 angle adjustable  _45 standard _46 caregiver access _47 caregiver assist _48 allows "hooking" to enable increased ability to perform ADLs or maintain balance  One armed device  _49 Lt   _50 Rt _51 enable propulsion of manual wheelchair with one arm   _52  ?????   Brake/wheel lock extension _53  Lt   _54  Rt _55 increase indep in applying wheel locks   _56 Side guards _57 prevent clothing getting caught in wheel or becoming soiled _58  prevent skin tears/abrasions  Battery: 22 x 2 _59 to power wheelchair ?????  Other: Width Extension for Foot Plates Safety; maintain foot placement on foot plate due to LE ABD ?????  The above equipment has a life- long use expectancy. Growth and changes in medical and/or functional conditions would be the exceptions. This is to certify that the therapist has no financial relationship with durable medical provider or manufacturer. The therapist will not receive remuneration of any kind for the equipment recommended in this evaluation.   Patient has mobility limitation that significantly impairs safe, timely participation in one or more mobility related ADL's.  (bathing,  toileting, feeding, dressing, grooming, moving from room to room)                                                             _60  Yes _61  No Will mobility device sufficiently improve ability to participate and/or be aided in participation of MRADL's?         _62  Yes _63  No Can limitation be compensated for with use of a cane or walker?                                                                                _64   Yes _0  No Does patient or caregiver demonstrate ability/potential ability & willingness to safely use the mobility device?   _1  Yes _2  No Does patient's home environment support use of recommended mobility device?                                                    _3  Yes _4  No Does patient have sufficient upper extremity function necessary to functionally propel a manual wheelchair?    _5  Yes _6  No Does patient have sufficient strength and trunk stability to safely operate a POV (scooter)?                                  _7  Yes _8  No Does patient need additional features/benefits provided by a power wheelchair for MRADL's in the home?       _9  Yes _10  No Does the patient demonstrate the ability to safely use a power wheelchair?                                                              _11  Yes _12  No  Therapist Name Printed: Rico Junker, PT, DPT Date: 10.21.2021  Therapist's Signature:   Date:   Supplier's Name Printed: Deber[employer] Pel[employer], [employer] Date: 10.21.2021  Supplier's Signature:   Date:  Patient/Caregiver Signature:   Date:     This is to certify that I have read this evaluation and do agree with the content within:      Physician's Name Printed: Billie Ruddy, MD  Physician's Signature:  Date:     This is to certify that I, the above signed therapist have the following affiliations: _13  This DME provider _14  Manufacturer of recommended equipment _15  Patient's long term care facility _16  None of the above       Objective measurements completed on examination:  See above findings.               PT Education - 05/08/20 1302    Education Details Process for obtaining a power wheelchair    Person(s) Educated Patient    Methods Explanation    Comprehension Verbalized understanding                Plan - 05/08/20 1303    Clinical Impression Statement Pt is a 69 year old male referred to Neuro OPPT for evaluation for power mobility.  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 The following deficits were noted during pt's exam: impaired sensation, impaired UE and LE AROM, impaired UE and LE strength, L shoulder pain and subluxation, impaired posture, impaired standing balance, and impaired gait placing patient at high risk for falls.  Pt has been utilizing a loaner power wheelchair that is not appropriate for patient's seating and mobility needs.  Pt would benefit from Group 2 power  wheelchair with gel cushion and tilt for independent pressure relief, to improve functional mobility independence and decrease falls risk.    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/20/18, chronic back pain, headaches, left shoulder pain, OA, HTN, insomnia, GI bleed, glaucoma R eye, hepatitis C, hiatal hernia with GERD, gout, HLD, ischemic colitis, prostate cancer, syncope, depression, L rotator cuff syndrome, symptomatic anemia causing hypotension    Examination-Activity Limitations Hygiene/Grooming;Dressing;Bathing;Locomotion Level;Toileting;Transfers     Examination-Participation Restrictions Cleaning;Community Activity;Meal Prep    Stability/Clinical Decision Making Evolving/Moderate complexity    Clinical Decision Making Moderate    Rehab Potential Good    PT Frequency One time visit    PT Duration Other (comment)   one time visit for wheelchair evaluation   PT Treatment/Interventions Other (comment)   eval only for power mobility   Consulted and Agree with Plan of Care Patient           Patient will benefit from skilled therapeutic intervention in order to improve the following deficits and impairments:  Abnormal gait, Decreased activity tolerance, Decreased balance, Decreased mobility, Decreased range of motion, Decreased strength, Difficulty walking, Increased edema, Impaired sensation, Impaired UE functional use, Postural dysfunction, Pain  Visit Diagnosis: Hemiplegia and hemiparesis following cerebral infarction affecting right dominant side (HCC)  Unsteadiness on feet  Other abnormalities of gait and mobility  Muscle weakness (generalized)  Abnormal posture  Other disturbances of skin sensation  Chronic left shoulder pain     Problem List Patient Active Problem List   Diagnosis Date Noted  . AKI (acute kidney injury) ([place]) 03/19/2020  . Reactive depression 10/10/2019  . Acute on chronic anemia   . Pain   . Agitation 07/26/2018  . Hepatitis C 07/26/2018  . Nontraumatic thalamic hemorrhage ([place]) 07/26/2018  . Osteoarthritis of left hip 04/06/2018  . Cameron ulcer, chronic 01/11/2018  . Symptomatic anemia 01/10/2018  . Syncope 01/10/2018  . Hiatal hernia with gastroesophageal reflux disease and esophagitis 01/05/2018  . Insomnia 01/05/2018  . Rotator cuff syndrome of left shoulder 11/21/2017  . Anemia, iron deficiency   . Gastrointestinal bleeding 10/24/2015  . Tobacco abuse   . Acute ischemic stroke ([place])   . Stroke ([place]) 05/31/2015  . HTN (hypertension) 04/10/2014  . Inguinal hernia without mention of  obstruction or gangrene, unilateral or unspecified, (not specified as recurrent)-right 01/30/2014    Rico Junker, PT, DPT 05/08/20    1:13 PM    [facility] [address] [place], [place], [address] Phone: [phone]   Fax:  [phone]  Name: [name] MRN: [id number] Date of Birth: 07/29/1950

## 2020-05-08 NOTE — Therapy (Signed)
[facility] [address] [place], [place], [address] Phone: [phone]   Fax:  [phone]  Patient Details  Name: Camry Theiss MRN: [id number] Date of Birth: 08-04-1950 Referring Provider:  No ref. provider found  Encounter Date: 05/08/2020  PHYSICAL THERAPY DISCHARGE SUMMARY/ non visit discharge  Visits from Start of Care: 5  Current functional level related to goals / functional outcomes: Pt had been placed on hold 12/06/19 due to very high BP readings and did not return. Goals not reassessed.   Remaining deficits: Pt used powerchair for most mobility. Was walking short distances in therapy with RW min assist.   Education / Equipment: HEP  Plan: Patient agrees to discharge.  Patient goals were not met. Patient is being discharged due to a change in medical status.  ?????         Electa Sniff, PT, DPT, NCS 05/08/2020, 9:51 AM  [facility] [address] [place], [place], [address] Phone: [phone]   Fax:  [phone]

## 2020-05-12 NOTE — Telephone Encounter (Signed)
potassium Rx:  The original prescription was discontinued on 03/21/2020 by Alma Friendly, MD for the following reason: Stop Taking at Discharge. Renewing this prescription may not be appropriate

## 2020-05-14 ENCOUNTER — Telehealth: Payer: Self-pay

## 2020-05-14 DIAGNOSIS — M109 Gout, unspecified: Secondary | ICD-10-CM | POA: Diagnosis not present

## 2020-05-14 DIAGNOSIS — M160 Bilateral primary osteoarthritis of hip: Secondary | ICD-10-CM | POA: Diagnosis not present

## 2020-05-14 DIAGNOSIS — I10 Essential (primary) hypertension: Secondary | ICD-10-CM | POA: Diagnosis not present

## 2020-05-14 DIAGNOSIS — E782 Mixed hyperlipidemia: Secondary | ICD-10-CM | POA: Diagnosis not present

## 2020-05-14 DIAGNOSIS — D500 Iron deficiency anemia secondary to blood loss (chronic): Secondary | ICD-10-CM | POA: Diagnosis not present

## 2020-05-14 DIAGNOSIS — G8929 Other chronic pain: Secondary | ICD-10-CM | POA: Diagnosis not present

## 2020-05-14 DIAGNOSIS — H409 Unspecified glaucoma: Secondary | ICD-10-CM | POA: Diagnosis not present

## 2020-05-14 DIAGNOSIS — K2100 Gastro-esophageal reflux disease with esophagitis, without bleeding: Secondary | ICD-10-CM | POA: Diagnosis not present

## 2020-05-14 DIAGNOSIS — M549 Dorsalgia, unspecified: Secondary | ICD-10-CM | POA: Diagnosis not present

## 2020-05-14 NOTE — Telephone Encounter (Signed)
Patient called in stating that he needs a letter sent over to his pharmacy stating that " he can not walk and needs a wheelchair to get around".. this letter has to be sent today in order for him to be able to get the wheelchair     [facility]

## 2020-05-15 NOTE — Telephone Encounter (Signed)
Spoke with pt state that [facility] is requesting for a letter stating that pt needs an electric wheelchair so they can offer him one, Please advise

## 2020-05-16 ENCOUNTER — Telehealth: Payer: Self-pay | Admitting: Family Medicine

## 2020-05-16 NOTE — Telephone Encounter (Signed)
Left message for patient to schedule Annual Wellness Visit.  Please schedule with Nurse Health Advisor Shannon Crews, RN at [facility]  

## 2020-05-19 NOTE — Telephone Encounter (Signed)
Pt is calling in stating that he has not heard anything and would like to know the status of the letter for a letter so that he can get a wheelchair.  Pt is aware that the previous message has gone back to the provider and the CMA has called him to discuss it.  Pt state that he has not spoke with anyone.

## 2020-05-20 NOTE — Telephone Encounter (Signed)
Please advise 

## 2020-05-22 ENCOUNTER — Telehealth: Payer: Self-pay | Admitting: Family Medicine

## 2020-05-22 DIAGNOSIS — E782 Mixed hyperlipidemia: Secondary | ICD-10-CM | POA: Diagnosis not present

## 2020-05-22 DIAGNOSIS — G8929 Other chronic pain: Secondary | ICD-10-CM | POA: Diagnosis not present

## 2020-05-22 DIAGNOSIS — H409 Unspecified glaucoma: Secondary | ICD-10-CM | POA: Diagnosis not present

## 2020-05-22 DIAGNOSIS — M549 Dorsalgia, unspecified: Secondary | ICD-10-CM | POA: Diagnosis not present

## 2020-05-22 DIAGNOSIS — K2100 Gastro-esophageal reflux disease with esophagitis, without bleeding: Secondary | ICD-10-CM | POA: Diagnosis not present

## 2020-05-22 DIAGNOSIS — M109 Gout, unspecified: Secondary | ICD-10-CM | POA: Diagnosis not present

## 2020-05-22 DIAGNOSIS — M160 Bilateral primary osteoarthritis of hip: Secondary | ICD-10-CM | POA: Diagnosis not present

## 2020-05-22 DIAGNOSIS — D500 Iron deficiency anemia secondary to blood loss (chronic): Secondary | ICD-10-CM | POA: Diagnosis not present

## 2020-05-22 DIAGNOSIS — I10 Essential (primary) hypertension: Secondary | ICD-10-CM | POA: Diagnosis not present

## 2020-05-22 NOTE — Telephone Encounter (Signed)
Radovan w/[facility] is calling to get extension 1 week 1 for OT and need a order for evaluation for a home health nurse to treat a pressure ulcer 1 cm wide in the buttocks crack.  If you do not get an answer you may leave a detail msg on his secured voicemail.

## 2020-05-23 ENCOUNTER — Telehealth: Payer: Medicare HMO | Admitting: Family Medicine

## 2020-05-23 ENCOUNTER — Telehealth: Payer: Self-pay

## 2020-05-23 NOTE — Telephone Encounter (Signed)
Spoke with pt advised that appointment for wheelchair evaluation  has to be in office visit and not telephone per Dr Saundra Shelling also advised to call his rehab and see if they can get him the wheelchair since they already did the wheelchair eval on pt per past appointment on 05/08/20, pt verbalized understanding

## 2020-05-23 NOTE — Telephone Encounter (Signed)
Spoke with pt to verify it he has any wound pt state that he has an open pressure sore on his bottom, ok for the verbal orders

## 2020-05-23 NOTE — Telephone Encounter (Signed)
Pt scheduled for a telephone visit with Dr Volanda Napoleon on 05/23/2020 at 11.30 am for wheelchair Eval

## 2020-05-25 NOTE — Telephone Encounter (Signed)
Ok

## 2020-05-28 NOTE — Telephone Encounter (Signed)
Left a Detailed message for Radovan with [facility] regarding approved VO orders as requested

## 2020-05-29 ENCOUNTER — Telehealth: Payer: Self-pay

## 2020-05-29 NOTE — Telephone Encounter (Signed)
Raquel Sarna from [employer] called in wanting to know have we received the faxed sent over on 05/16/20 for this patients standing order regarding his power wheelchair    Please call and advise

## 2020-05-30 ENCOUNTER — Other Ambulatory Visit: Payer: Self-pay | Admitting: Family Medicine

## 2020-05-30 ENCOUNTER — Other Ambulatory Visit: Payer: Self-pay | Admitting: Physical Medicine & Rehabilitation

## 2020-05-30 DIAGNOSIS — K2100 Gastro-esophageal reflux disease with esophagitis, without bleeding: Secondary | ICD-10-CM | POA: Diagnosis not present

## 2020-05-30 DIAGNOSIS — M109 Gout, unspecified: Secondary | ICD-10-CM | POA: Diagnosis not present

## 2020-05-30 DIAGNOSIS — H409 Unspecified glaucoma: Secondary | ICD-10-CM | POA: Diagnosis not present

## 2020-05-30 DIAGNOSIS — M549 Dorsalgia, unspecified: Secondary | ICD-10-CM | POA: Diagnosis not present

## 2020-05-30 DIAGNOSIS — M160 Bilateral primary osteoarthritis of hip: Secondary | ICD-10-CM | POA: Diagnosis not present

## 2020-05-30 DIAGNOSIS — E782 Mixed hyperlipidemia: Secondary | ICD-10-CM | POA: Diagnosis not present

## 2020-05-30 DIAGNOSIS — F5101 Primary insomnia: Secondary | ICD-10-CM

## 2020-05-30 DIAGNOSIS — I10 Essential (primary) hypertension: Secondary | ICD-10-CM | POA: Diagnosis not present

## 2020-05-30 DIAGNOSIS — G8929 Other chronic pain: Secondary | ICD-10-CM | POA: Diagnosis not present

## 2020-05-30 DIAGNOSIS — D500 Iron deficiency anemia secondary to blood loss (chronic): Secondary | ICD-10-CM | POA: Diagnosis not present

## 2020-05-30 NOTE — Telephone Encounter (Signed)
Fax not received and pt is scheduled for appointment for 06/05/2020

## 2020-05-31 DIAGNOSIS — I10 Essential (primary) hypertension: Secondary | ICD-10-CM | POA: Diagnosis not present

## 2020-05-31 DIAGNOSIS — H409 Unspecified glaucoma: Secondary | ICD-10-CM | POA: Diagnosis not present

## 2020-05-31 DIAGNOSIS — E782 Mixed hyperlipidemia: Secondary | ICD-10-CM | POA: Diagnosis not present

## 2020-05-31 DIAGNOSIS — M549 Dorsalgia, unspecified: Secondary | ICD-10-CM | POA: Diagnosis not present

## 2020-05-31 DIAGNOSIS — K2100 Gastro-esophageal reflux disease with esophagitis, without bleeding: Secondary | ICD-10-CM | POA: Diagnosis not present

## 2020-05-31 DIAGNOSIS — M160 Bilateral primary osteoarthritis of hip: Secondary | ICD-10-CM | POA: Diagnosis not present

## 2020-05-31 DIAGNOSIS — M109 Gout, unspecified: Secondary | ICD-10-CM | POA: Diagnosis not present

## 2020-05-31 DIAGNOSIS — G8929 Other chronic pain: Secondary | ICD-10-CM | POA: Diagnosis not present

## 2020-05-31 DIAGNOSIS — D500 Iron deficiency anemia secondary to blood loss (chronic): Secondary | ICD-10-CM | POA: Diagnosis not present

## 2020-06-02 DIAGNOSIS — L89152 Pressure ulcer of sacral region, stage 2: Secondary | ICD-10-CM | POA: Diagnosis not present

## 2020-06-05 ENCOUNTER — Ambulatory Visit: Payer: Medicare HMO | Admitting: Family Medicine

## 2020-06-11 ENCOUNTER — Ambulatory Visit: Payer: Medicare HMO | Admitting: Family Medicine

## 2020-06-11 DIAGNOSIS — R6889 Other general symptoms and signs: Secondary | ICD-10-CM | POA: Diagnosis not present

## 2020-06-18 ENCOUNTER — Telehealth: Payer: Self-pay | Admitting: Family Medicine

## 2020-06-18 ENCOUNTER — Ambulatory Visit: Payer: Medicare HMO | Admitting: Family Medicine

## 2020-06-18 NOTE — Telephone Encounter (Signed)
[name] is calling in from the pharmacy stating that the pt needs a refill on the following Rx's cyclobenzaprine (FLEXERIL) 5 MG, QUEtiapine (SEROQUEL) 200 MG, buPROPion (WELLBUTRIN SR) 200 MG (all need to be #90)  Pharm:  [facility]  Pt spoke with them and stated that he needs a refill on all of his medications and these are the ones that had not already been refilled.

## 2020-06-18 NOTE — Telephone Encounter (Signed)
Spoke with Janett Billow with [facility] pharmacy, advised that Rx requested are prescribed by pt Rehabilitation Dr Alger Simons. Provided the contact number to Rehabilitation

## 2020-06-19 ENCOUNTER — Other Ambulatory Visit: Payer: Self-pay | Admitting: Physical Medicine & Rehabilitation

## 2020-06-19 ENCOUNTER — Encounter: Payer: Self-pay | Admitting: Family Medicine

## 2020-06-19 DIAGNOSIS — I10 Essential (primary) hypertension: Secondary | ICD-10-CM | POA: Diagnosis not present

## 2020-06-19 DIAGNOSIS — E782 Mixed hyperlipidemia: Secondary | ICD-10-CM | POA: Diagnosis not present

## 2020-06-19 DIAGNOSIS — G8929 Other chronic pain: Secondary | ICD-10-CM | POA: Diagnosis not present

## 2020-06-19 DIAGNOSIS — K2100 Gastro-esophageal reflux disease with esophagitis, without bleeding: Secondary | ICD-10-CM | POA: Diagnosis not present

## 2020-06-19 DIAGNOSIS — D500 Iron deficiency anemia secondary to blood loss (chronic): Secondary | ICD-10-CM | POA: Diagnosis not present

## 2020-06-19 DIAGNOSIS — M549 Dorsalgia, unspecified: Secondary | ICD-10-CM | POA: Diagnosis not present

## 2020-06-19 DIAGNOSIS — H409 Unspecified glaucoma: Secondary | ICD-10-CM | POA: Diagnosis not present

## 2020-06-19 DIAGNOSIS — M109 Gout, unspecified: Secondary | ICD-10-CM | POA: Diagnosis not present

## 2020-06-19 DIAGNOSIS — I619 Nontraumatic intracerebral hemorrhage, unspecified: Secondary | ICD-10-CM

## 2020-06-19 DIAGNOSIS — M160 Bilateral primary osteoarthritis of hip: Secondary | ICD-10-CM | POA: Diagnosis not present

## 2020-06-19 DIAGNOSIS — F32A Depression, unspecified: Secondary | ICD-10-CM

## 2020-06-19 DIAGNOSIS — F5101 Primary insomnia: Secondary | ICD-10-CM

## 2020-06-27 ENCOUNTER — Telehealth: Payer: Self-pay | Admitting: Family Medicine

## 2020-06-27 NOTE — Telephone Encounter (Signed)
Patient is calling and requesting a call back regarding getting a wheelchair and paperwork, please advise. CB is [phone]

## 2020-07-02 ENCOUNTER — Encounter: Payer: Medicare HMO | Attending: Physical Medicine & Rehabilitation | Admitting: Physical Medicine & Rehabilitation

## 2020-07-02 NOTE — Telephone Encounter (Signed)
Pt is aware that he needs an office visit for the wheelchair evaluation. Pt has has cancelled his appointment a few times and has not been able to get the eval due to this.

## 2020-07-04 NOTE — Telephone Encounter (Signed)
Pt called the office and stated that he needs to get his wheelchair and wanted to know why our office has not sent in his information so that he could get it.  Pt is aware that he has missed several appointments for the evaluation for the wheelchair and per Dr. Volanda Napoleon he needs to wait until after the 1st of the year around sometime in February 2022 to be scheduled for this kind of visit.

## 2020-07-30 ENCOUNTER — Ambulatory Visit (INDEPENDENT_AMBULATORY_CARE_PROVIDER_SITE_OTHER): Payer: Medicare HMO | Admitting: Family Medicine

## 2020-07-30 ENCOUNTER — Other Ambulatory Visit: Payer: Self-pay

## 2020-07-30 ENCOUNTER — Encounter: Payer: Self-pay | Admitting: Family Medicine

## 2020-07-30 VITALS — BP 126/78 | HR 82 | Temp 97.6°F | Ht 69.0 in | Wt 173.0 lb

## 2020-07-30 DIAGNOSIS — R6889 Other general symptoms and signs: Secondary | ICD-10-CM | POA: Diagnosis not present

## 2020-07-30 DIAGNOSIS — R432 Parageusia: Secondary | ICD-10-CM

## 2020-07-30 DIAGNOSIS — R634 Abnormal weight loss: Secondary | ICD-10-CM

## 2020-07-30 DIAGNOSIS — Z7409 Other reduced mobility: Secondary | ICD-10-CM | POA: Diagnosis not present

## 2020-07-30 DIAGNOSIS — F17210 Nicotine dependence, cigarettes, uncomplicated: Secondary | ICD-10-CM

## 2020-07-30 DIAGNOSIS — R430 Anosmia: Secondary | ICD-10-CM

## 2020-07-30 LAB — CBC WITH DIFFERENTIAL/PLATELET
Basophils Absolute: 0 10*3/uL (ref 0.0–0.1)
Basophils Relative: 0.7 % (ref 0.0–3.0)
Eosinophils Absolute: 0 10*3/uL (ref 0.0–0.7)
Eosinophils Relative: 0.6 % (ref 0.0–5.0)
HCT: 35 % — ABNORMAL LOW (ref 39.0–52.0)
Hemoglobin: 11.2 g/dL — ABNORMAL LOW (ref 13.0–17.0)
Lymphocytes Relative: 20.8 % (ref 12.0–46.0)
Lymphs Abs: 1.1 10*3/uL (ref 0.7–4.0)
MCHC: 31.9 g/dL (ref 30.0–36.0)
MCV: 75.1 fl — ABNORMAL LOW (ref 78.0–100.0)
Monocytes Absolute: 0.3 10*3/uL (ref 0.1–1.0)
Monocytes Relative: 5.8 % (ref 3.0–12.0)
Neutro Abs: 3.9 10*3/uL (ref 1.4–7.7)
Neutrophils Relative %: 72.1 % (ref 43.0–77.0)
Platelets: 455 10*3/uL — ABNORMAL HIGH (ref 150.0–400.0)
RBC: 4.66 Mil/uL (ref 4.22–5.81)
RDW: 21.2 % — ABNORMAL HIGH (ref 11.5–15.5)
WBC: 5.4 10*3/uL (ref 4.0–10.5)

## 2020-07-30 LAB — COMPREHENSIVE METABOLIC PANEL WITH GFR
ALT: 10 U/L (ref 0–53)
AST: 9 U/L (ref 0–37)
Albumin: 4.4 g/dL (ref 3.5–5.2)
Alkaline Phosphatase: 136 U/L — ABNORMAL HIGH (ref 39–117)
BUN: 15 mg/dL (ref 6–23)
CO2: 25 meq/L (ref 19–32)
Calcium: 9.3 mg/dL (ref 8.4–10.5)
Chloride: 106 meq/L (ref 96–112)
Creatinine, Ser: 1.04 mg/dL (ref 0.40–1.50)
GFR: 72.32 mL/min
Glucose, Bld: 80 mg/dL (ref 70–99)
Potassium: 3.6 meq/L (ref 3.5–5.1)
Sodium: 140 meq/L (ref 135–145)
Total Bilirubin: 0.5 mg/dL (ref 0.2–1.2)
Total Protein: 7.1 g/dL (ref 6.0–8.3)

## 2020-07-30 LAB — TSH: TSH: 0.86 u[IU]/mL (ref 0.35–4.50)

## 2020-07-30 LAB — T4, FREE: Free T4: 0.86 ng/dL (ref 0.60–1.60)

## 2020-07-30 LAB — VITAMIN B12: Vitamin B-12: 310 pg/mL (ref 211–911)

## 2020-07-30 NOTE — Progress Notes (Signed)
Subjective:    Patient ID: [name], male    DOB: 1950-08-30, 70 y.o.   MRN: [id number]  No chief complaint on file.   HPI Patient is a 70 year old male with past medical history significant for h/o CVA, HTN, hiatal hernia with GERD, h/o Hep C, insomnia, GIB, anemia, h/o prostate cancer, tobacco use, HLD, glaucoma who was seen today for wheelchair evaluation and follow-up.  Pt unable to ambulate safely with other assistive device 2/2 history of CVA causing him to be unstable. States unable to stand for very long or walk across a room.  Currently using a power wheelchair obtained from a 2nd hand shop.  Improper positioning and current wheelchair causing recurrent pressure ulcer of buttock and L hip pain.  Has difficulty getting in the car on Sundays to attend church with his wife.  Patient previously seen in PT with limited progress in ambulation.  Patient also notes continued loss of taste and smell times months.  Noted after hospitalization.  Patient eating less and losing weight due to this.  Pt tries to eat a few bites at each meal but finds it difficult.     Patient endorses smoking cigarettes since age 10.  Currently smoking half a pack per day as unable to taste the cigarettes.  Previously smoked 1 pack/day.  Past Medical History:  Diagnosis Date  . Arthritis   . Cameron ulcer, chronic 01/11/2018  . Chronic back pain   . GERD (gastroesophageal reflux disease)    uses Baking Soda  . GIB (gastrointestinal bleeding) 2012  . Glaucoma    right eye  . Headache    occasionally  . Hepatitis C   . Hiatal hernia with gastroesophageal reflux disease and esophagitis 01/05/2018  . History of blood transfusion    no abnormal reaction noted  . History of gout    doesn't take any meds  . Hyperlipidemia    not on any meds  . Hypertension    takes Amlodipine and Lisinopril daily  . Insomnia    takes Ambien nightly  . Ischemic colitis ([place]) 2012  . Joint pain   . Nocturia   .  Numbness    both legs occasionally  . PONV (postoperative nausea and vomiting)   . Prostate cancer ([place])   . Shortness of breath dyspnea    rarely but when notices he can be lying/sitting/exertion.Dr.Hochrein is aware per pt  . Stroke ([place]) 2016   . Urinary frequency   . Urinary urgency     Allergies  Allergen Reactions  . Penicillins Anaphylaxis    Has patient had a PCN reaction causing immediate rash, facial/tongue/throat swelling, SOB or lightheadedness with hypotension: Yes Has patient had a PCN reaction causing severe rash involving mucus membranes or skin necrosis: No Has patient had a PCN reaction that required hospitalization: No Has patient had a PCN reaction occurring within the last 10 years: No If all of the above answers are "NO", then may proceed with Cephalosporin use..  . Sudafed [Pseudoephedrine] Other (See Comments)    Heart "races"    ROS General: Denies fever, chills, night sweats, changes in weight, changes in appetite  HEENT: Denies headaches, ear pain, changes in vision, rhinorrhea, sore throat  + decreased taste and smell CV: Denies CP, palpitations, SOB, orthopnea Pulm: Denies SOB, cough, wheezing GI: Denies abdominal pain, nausea, vomiting, diarrhea, constipation GU: Denies dysuria, hematuria, frequency Msk: Denies muscle cramps, joint pains  +L hip pain Neuro: Denies weakness, numbness, tingling  +difficulty/instability  with ambulation Skin: Denies rashes, bruising Psych: Denies depression, anxiety, hallucinations      Objective:    Blood pressure 126/78, pulse 82, temperature 97.6 F (36.4 C), temperature source Oral, height 5\' 9"  (1.753 m), weight 173 lb (78.5 kg), SpO2 99 %.  Gen. Pleasant, well-nourished, in no distress, normal affect  HEENT: [place]/AT, face symmetric, conjunctiva clear, no scleral icterus, PERRLA, EOMI, nares patent without drainage.  Patient able to detect the odor of ammonia from smelling salts Lungs: no accessory muscle use,  CTAB, no wheezes or rales Cardiovascular: RRR, no m/r/g, no peripheral edema Musculoskeletal: No deformities, no cyanosis or clubbing, normal tone Neuro:  A&Ox3, CN II-XII intact, gait not assessed as sitting in power wheelchair. Skin:  Warm, no lesions/ rash  Wt Readings from Last 3 Encounters:  03/31/20 179 lb (81.2 kg)  03/20/20 187 lb 2.7 oz (84.9 kg)  03/05/20 192 lb (87.1 kg)    Lab Results  Component Value Date   WBC 6.8 03/31/2020   HGB 9.7 (L) 03/31/2020   HCT 32.0 (L) 03/31/2020   PLT 363 03/31/2020   GLUCOSE 88 03/31/2020   CHOL 102 07/22/2018   TRIG 108 07/22/2018   HDL 35 (L) 07/22/2018   LDLCALC 45 07/22/2018   ALT 9 03/19/2020   AST 12 (L) 03/19/2020   NA 138 03/31/2020   K 3.9 03/31/2020   CL 105 03/31/2020   CREATININE 1.18 03/31/2020   BUN 14 03/31/2020   CO2 25 03/31/2020   TSH 0.36 (L) 01/31/2020   INR 1.23 07/20/2018   HGBA1C 4.7 (L) 07/22/2018    Assessment/Plan:  Impaired mobility -Seen by PT -Continue use of wheelchair with appropriate cushioning -Continue follow-up with PM and R.  Restart PT as appropriate  Anosmia -Chronic, stable -Discussed possible causes including electrolyte deficiencies, previous COVID-19 infection, chronic sinusitis.  Also consider intracranial lesion -Reacted to smelling salts -CT head w/o contrast 02/12/20 with chronic sequela of prior left thalamic hemorrhage, chronic lacunar infarcts, chronic small vessel ischemic disease, mild generalized parenchymal atrophy, mild ethmoid sinus mucosal thickening -Will obtain labs -Discussed referral to ENT -Consider imaging based on lab results - Plan: Zinc, Vitamin B12, TSH, T4, free, CBC with Differential/Platelet, CMP, referral to ENT  Loss of taste - Plan: Zinc, Vitamin B12, TSH, T4, free, CBC with Differential/Platelet, CMP, CMP, CBC with Differential/Platelet, Zinc, Vitamin B12, TSH, T4, free, SARS-CoV-2 Antibody(IgG)Spike,Semi-Quantitative, referral to ENT  Weight  loss  -discussed trying supplemental shakes and trying to eat small frequent meals throughout the day. -will obtain labs -CXR 03/19/20 with moderate hiatal hernia. -consider CT chest given tobacco hx. - Plan: TSH, T4, free, CBC with Differential/Platelet, CMP, CMP, CBC with Differential/Platelet, TSH, T4, free, SARS-CoV-2 Antibody(IgG)Spike,Semi-Quantitative  Cigarette nicotine dependence without complication -Smoking cessation counseling greater than 3 minutes, less than 10 minutes -Started smoking at age 14, currently smoking half pack per day -Discussed quit aids -Continue to monitor  F/u in 1 month.  [name], MD

## 2020-07-31 LAB — SARS-COV-2 ANTIBODY(IGG)SPIKE,SEMI-QUANTITATIVE: SARS COV1 AB(IGG)SPIKE,SEMI QN: 28.57 index — ABNORMAL HIGH (ref <1.00)

## 2020-08-02 LAB — ZINC: Zinc: 66 ug/dL (ref 60–130)

## 2020-08-02 LAB — SARS-COV-2 ANTIBODY(IGG)SPIKE,SEMI-QUANTITATIVE

## 2020-08-06 ENCOUNTER — Other Ambulatory Visit: Payer: Self-pay | Admitting: Adult Health

## 2020-08-06 ENCOUNTER — Other Ambulatory Visit: Payer: Self-pay | Admitting: Family Medicine

## 2020-08-06 ENCOUNTER — Telehealth: Payer: Self-pay | Admitting: Family Medicine

## 2020-08-06 DIAGNOSIS — I10 Essential (primary) hypertension: Secondary | ICD-10-CM

## 2020-08-06 NOTE — Telephone Encounter (Signed)
[name] w/[employer] is all needing the last office notes in which the pt was in the office for his powerwheel chair it need to be faxed to [phone].

## 2020-08-06 NOTE — Telephone Encounter (Signed)
Pt is calling in stating that the doctor that he was set up with for his smell and taste does not accept medicaid and would like to see if he can be re-referred to someone that does take medicaid and medicare.  Pt would like to have a call back.

## 2020-08-20 NOTE — Telephone Encounter (Signed)
Spoke with pt advised to call insurance for in network specialist since [employer] do not accept Medicaid

## 2020-08-20 NOTE — Telephone Encounter (Signed)
Pt last office notes sent to [facility] as requested

## 2020-08-27 ENCOUNTER — Ambulatory Visit: Payer: Medicare HMO

## 2020-09-03 ENCOUNTER — Other Ambulatory Visit: Payer: Self-pay | Admitting: Physical Medicine & Rehabilitation

## 2020-09-03 DIAGNOSIS — F5101 Primary insomnia: Secondary | ICD-10-CM

## 2020-09-11 DIAGNOSIS — I69159 Hemiplegia and hemiparesis following nontraumatic intracerebral hemorrhage affecting unspecified side: Secondary | ICD-10-CM | POA: Diagnosis not present

## 2020-09-11 DIAGNOSIS — L89159 Pressure ulcer of sacral region, unspecified stage: Secondary | ICD-10-CM | POA: Diagnosis not present

## 2020-09-22 DIAGNOSIS — K921 Melena: Secondary | ICD-10-CM | POA: Diagnosis not present

## 2020-09-22 LAB — CBC AND DIFFERENTIAL
HCT: 33 — AB (ref 41–53)
Hemoglobin: 10.3 — AB (ref 13.5–17.5)
Platelets: 331 (ref 150–399)
WBC: 6.3

## 2020-09-22 LAB — HEPATIC FUNCTION PANEL
ALT: 19 (ref 10–40)
AST: 14 (ref 14–40)
Bilirubin, Total: 0.4

## 2020-09-22 LAB — CBC: RBC: 4.21 (ref 3.87–5.11)

## 2020-09-22 LAB — BASIC METABOLIC PANEL
CO2: 23 — AB (ref 13–22)
Chloride: 109 — AB (ref 99–108)
Creatinine: 1.3 (ref <=1.3)
Potassium: 4.3 (ref 3.4–5.3)
Sodium: 139 (ref 137–147)

## 2020-09-22 LAB — COMPREHENSIVE METABOLIC PANEL
Albumin: 4.1 (ref 3.5–5.0)
Calcium: 9 (ref 8.7–10.7)

## 2020-09-30 NOTE — Progress Notes (Signed)
Subjective:   [name] is a 70 y.o. male who presents for an Initial Medicare Annual Wellness Visit.  I connected with [name] today by telephone and verified that I am speaking with the correct person using two identifiers. Location patient: home Location provider: work Persons participating in the virtual visit: patient, provider.   I discussed the limitations, risks, security and privacy concerns of performing an evaluation and management service by telephone and the availability of in person appointments. I also discussed with the patient that there may be a patient responsible charge related to this service. The patient expressed understanding and verbally consented to this telephonic visit.    Interactive audio and video telecommunications were attempted between this provider and patient, however failed, due to patient having technical difficulties OR patient did not have access to video capability.  We continued and completed visit with audio only.      Review of Systems    N/A Cardiac Risk Factors include: advanced age (>44men, >39 women);male gender     Objective:    Today's Vitals   10/01/20 0823  PainSc: 9    There is no height or weight on file to calculate BMI.  Advanced Directives 10/01/2020 01/01/2020 10/11/2019 09/20/2019 10/18/2018 07/26/2018 07/24/2018  Does Patient Have a Medical Advance Directive? No No No No No No No  Does patient want to make changes to medical advance directive? - - - - - - -  Would patient like information on creating a medical advance directive? No - Patient declined - No - Patient declined Yes (ED - Information included in AVS) - Yes (Inpatient - patient requests chaplain consult to create a medical advance directive) No - Patient declined    Current Medications (verified) Outpatient Encounter Medications as of 10/01/2020  Medication Sig  . ALPRAZolam (XANAX) 0.25 MG tablet Take 1 tablet (0.25 mg total) by mouth daily as  needed for anxiety.  . Blood Pressure Monitoring (BLOOD PRESSURE MONITOR/L CUFF) MISC Use as directed daily.  . chlorthalidone (HYGROTON) 25 MG tablet TAKE 1 TABLET BY MOUTH DAILY  . cyclobenzaprine (FLEXERIL) 5 MG tablet TAKE 1 TABLET BY MOUTH TWICE A DAY AS NEEDED FOR MUSCLE SPASMS  . ferrous sulfate 325 (65 FE) MG tablet TAKE 1 TABLET BY MOUTH THREE TWICE A DAY WITH MEALS (Patient taking differently: Take 325 mg by mouth 3 (three) times daily with meals.)  . hydrALAZINE (APRESOLINE) 25 MG tablet TAKE 1 TABLET BY MOUTH THREE TIMES A DAY  . pantoprazole (PROTONIX) 40 MG tablet TAKE 1 TABLET BY MOUTH TWICE A DAY BEFORE MEALS  . QUEtiapine (SEROQUEL) 200 MG tablet TAKE 1 TABLET BY MOUTH EVERY NIGHT AT BEDTIME  . sodium chloride (OCEAN) 0.65 % SOLN nasal spray Place 1 spray into both nostrils as needed for congestion.  . traMADol (ULTRAM) 50 MG tablet Take 1 tablet (50 mg total) by mouth every 12 (twelve) hours as needed for severe pain (shouder pain).  Marland Kitchen acetaminophen (TYLENOL) 325 MG tablet Take 2 tablets (650 mg total) by mouth 4 (four) times daily -  with meals and at bedtime. (Patient not taking: Reported on 10/01/2020)  . buPROPion (WELLBUTRIN SR) 200 MG 12 hr tablet TAKE 1 TABLET BY MOUTH DAILY  . potassium chloride SA (KLOR-CON) 20 MEQ tablet TAKE 1 TABLET BY MOUTH DAILY (Patient not taking: Reported on 10/01/2020)  . senna-docusate (SENOKOT-S) 8.6-50 MG tablet Take 1 tablet by mouth 2 (two) times daily. (Patient not taking: Reported on 10/01/2020)  No facility-administered encounter medications on file as of 10/01/2020.    Allergies (verified) Penicillins and Sudafed [pseudoephedrine]   History: Past Medical History:  Diagnosis Date  . Arthritis   . [place] ulcer, chronic 01/11/2018  . Chronic back pain   . GERD (gastroesophageal reflux disease)    uses Baking Soda  . GIB (gastrointestinal bleeding) 2012  . Glaucoma    right eye  . Headache    occasionally  . Hepatitis C   .  Hiatal hernia with gastroesophageal reflux disease and esophagitis 01/05/2018  . History of blood transfusion    no abnormal reaction noted  . History of gout    doesn't take any meds  . Hyperlipidemia    not on any meds  . Hypertension    takes Amlodipine and Lisinopril daily  . Insomnia    takes Ambien nightly  . Ischemic colitis ([place]) 2012  . Joint pain   . Nocturia   . Numbness    both legs occasionally  . PONV (postoperative nausea and vomiting)   . Prostate cancer ([place])   . Shortness of breath dyspnea    rarely but when notices he can be lying/sitting/exertion.Dr.Hochrein is aware per pt  . Stroke ([place]) 2016   . Urinary frequency   . Urinary urgency    Past Surgical History:  Procedure Laterality Date  . APPENDECTOMY    . BIOPSY  01/11/2018   Procedure: BIOPSY;  Surgeon: Gatha Mayer, MD;  Location: [facility] ENDOSCOPY;  Service: Endoscopy;;  . COLONOSCOPY    . COLONOSCOPY N/A 10/26/2015   Procedure: COLONOSCOPY;  Surgeon: Doran Stabler, MD;  Location: [facility] ENDOSCOPY;  Service: Endoscopy;  Laterality: N/A;  . ESOPHAGOGASTRODUODENOSCOPY    . ESOPHAGOGASTRODUODENOSCOPY N/A 10/26/2015   Procedure: ESOPHAGOGASTRODUODENOSCOPY (EGD);  Surgeon: Doran Stabler, MD;  [facility] ENDOSCOPY;  Service: Endoscopy;  Laterality: N/A;  . ESOPHAGOGASTRODUODENOSCOPY (EGD) WITH PROPOFOL N/A 01/11/2018   Procedure: ESOPHAGOGASTRODUODENOSCOPY (EGD) WITH PROPOFOL;  Surgeon: Gatha Mayer, MD;  Location: [facility] ENDOSCOPY;  Service: Endoscopy;  Laterality: N/A;  . INGUINAL HERNIA REPAIR Right 11/04/2014   Procedure: RIGHT INGUINAL HERNIA REPAIR WITH MESH;  Surgeon: Jackolyn Confer, MD;  Location: [place];  Service: General;  Laterality: Right;  . MASS EXCISION N/A 11/04/2014   Procedure: REMOVAL OF RIGHT GROIN SOFT TISSUE MASS;  Surgeon: Jackolyn Confer, MD;  Location: [place];  Service: General;  Laterality: N/A;  . Multiple abdominal surgeries     total of 13  . PROSTATECTOMY    . SMALL INTESTINE  SURGERY     Family History  Problem Relation Age of Onset  . Alzheimer's disease Father   . Cancer Mother        Lymph node  . Heart failure Brother 45       Transplant 13 years ago  . CAD Brother   . Heart disease Sister 17       MI   Social History   Socioeconomic History  . Marital status: Legally Separated    Spouse name: Not on file  . Number of children: 2  . Years of education: Not on file  . Highest education level: Not on file  Occupational History  . Occupation: retired  Tobacco Use  . Smoking status: Current Every Day Smoker    Packs/day: 0.50    Years: 50.00    Pack years: 25.00    Types: Cigarettes  . Smokeless tobacco: Never Used  Vaping Use  . Vaping Use: Former  Substance and Sexual Activity  . Alcohol use: Yes    Alcohol/week: 0.0 standard drinks    Comment: occasionally  . Drug use: Yes    Frequency: 5.0 times per week    Types: Marijuana    Comment: "as often as I can get it"  . Sexual activity: Yes  Other Topics Concern  . Not on file  Social History Narrative   One living child .  One murdered.  Lives with girlfriend.     Social Determinants of Health   Financial Resource Strain: Low Risk   . Difficulty of Paying Living Expenses: Not hard at all  Food Insecurity: No Food Insecurity  . Worried About Charity fundraiser in the Last Year: Never true  . Ran Out of Food in the Last Year: Never true  Transportation Needs: No Transportation Needs  . Lack of Transportation (Medical): No  . Lack of Transportation (Non-Medical): No  Physical Activity: Inactive  . Days of Exercise per Week: 0 days  . Minutes of Exercise per Session: 0 min  Stress: No Stress Concern Present  . Feeling of Stress : Not at all  Social Connections: Socially Isolated  . Frequency of Communication with Friends and Family: Once a week  . Frequency of Social Gatherings with Friends and Family: Never  . Attends Religious Services: Never  . Active Member of Clubs or  Organizations: No  . Attends Archivist Meetings: Never  . Marital Status: Separated    Tobacco Counseling Ready to quit: Not Answered Counseling given: Not Answered   Clinical Intake:  Pre-visit preparation completed: Yes  Pain : 0-10 Pain Score: 9  Pain Type: Chronic pain Pain Location: Leg Pain Orientation: Right,Left Pain Descriptors / Indicators: Aching Pain Onset: More than a month ago Pain Frequency: Constant Pain Relieving Factors: Resting  Pain Relieving Factors: Resting  Nutritional Risks: None Diabetes: No  How often do you need to have someone help you when you read instructions, pamphlets, or other written materials from your doctor or pharmacy?: 3 - Sometimes What is the last grade level you completed in school?: 11th grade  Diabetic?No  Interpreter Needed?: No  Information entered by :: [place] of Daily Living In your present state of health, do you have any difficulty performing the following activities: 10/01/2020  Hearing? Y  Vision? Y  Difficulty concentrating or making decisions? Y  Comment short term and long term memory issues  Walking or climbing stairs? Y  Dressing or bathing? N  Doing errands, shopping? Y  Preparing Food and eating ? Y  Using the Toilet? N  In the past six months, have you accidently leaked urine? N  Do you have problems with loss of bowel control? N  Managing your Medications? Y  Managing your Finances? N  Housekeeping or managing your Housekeeping? Y  Some recent data might be hidden    Patient Care Team: Billie Ruddy, MD as PCP - General (Family Medicine)  Indicate any recent Medical Services you may have received from other than [facility] providers in the past year (date may be approximate).     Assessment:   This is a routine wellness examination for [place].  Hearing/Vision screen  Hearing Screening   125Hz  250Hz  500Hz  1000Hz  2000Hz  3000Hz  4000Hz  6000Hz  8000Hz   Right ear:            Left ear:           Vision Screening Comments: States has not had  eye examined for several years. Wears reading glasses    Dietary issues and exercise activities discussed: Current Exercise Habits: The patient does not participate in regular exercise at present  Goals    . Patient Stated     I want to be able to walk again       Depression Screen PHQ 2/9 Scores 10/01/2020 03/05/2020 12/25/2019 09/19/2019 09/19/2019 10/31/2018 10/18/2017  PHQ - 2 Score 4 0 1 3 3  0 0  PHQ- 9 Score 17 - 9 14 14  - -  Exception Documentation - - - - - - -    Fall Risk Fall Risk  10/01/2020 03/05/2020 12/05/2019 10/10/2019 08/08/2019  Falls in the past year? 1 (No Data) 0 1 0  Comment - 03/2020 "blacked out the the MD visit" - - -  Number falls in past yr: 1 - - 1 -  Injury with Fall? 0 - - 0 -  Risk for fall due to : History of fall(s);Impaired mobility;Impaired balance/gait - - - -  Risk for fall due to: Comment - - - - -  Follow up Falls evaluation completed;Falls prevention discussed - - - -    FALL RISK PREVENTION PERTAINING TO THE HOME:  Any stairs in or around the home? No  If so, are there any without handrails? No  Home free of loose throw rugs in walkways, pet beds, electrical cords, etc? Yes  Adequate lighting in your home to reduce risk of falls? Yes   ASSISTIVE DEVICES UTILIZED TO PREVENT FALLS:  Life alert? No  Use of a cane, walker or w/c? Yes  Grab bars in the bathroom? Yes  Shower chair or bench in shower? Yes  Elevated toilet seat or a handicapped toilet? Yes    Cognitive Function:     6CIT Screen 10/01/2020  What Year? 0 points  What month? 3 points  What time? 3 points  Count back from 20 0 points  Months in reverse 4 points  Repeat phrase 10 points  Total Score 20    Immunizations Immunization History  Administered Date(s) Administered  . Hepatitis B, adult 10/25/2016, 11/23/2016, 04/25/2017  . Influenza,inj,Quad PF,6+ Mos 04/25/2017  . Influenza-Unspecified  04/17/2007, 03/31/2011  . Pneumococcal Polysaccharide-23 10/25/2016  . Pneumococcal-Unspecified 03/31/2011    TDAP status: Due, Education has been provided regarding the importance of this vaccine. Advised may receive this vaccine at local pharmacy or Health Dept. Aware to provide a copy of the vaccination record if obtained from local pharmacy or Health Dept. Verbalized acceptance and understanding.  Flu Vaccine status: Due, Education has been provided regarding the importance of this vaccine. Advised may receive this vaccine at local pharmacy or Health Dept. Aware to provide a copy of the vaccination record if obtained from local pharmacy or Health Dept. Verbalized acceptance and understanding.  Pneumococcal vaccine status: Due, Education has been provided regarding the importance of this vaccine. Advised may receive this vaccine at local pharmacy or Health Dept. Aware to provide a copy of the vaccination record if obtained from local pharmacy or Health Dept. Verbalized acceptance and understanding.  Covid-19 vaccine status: Completed vaccines  Qualifies for Shingles Vaccine? Yes   Zostavax completed No   Shingrix Completed?: No.    Education has been provided regarding the importance of this vaccine. Patient has been advised to call insurance company to determine out of pocket expense if they have not yet received this vaccine. Advised may also receive vaccine at local pharmacy or Health Dept. Verbalized acceptance and  understanding.  Screening Tests Health Maintenance  Topic Date Due  . COVID-19 Vaccine (1) Never done  . TETANUS/TDAP  Never done  . PNA vac Low Risk Adult (2 of 2 - PCV13) 10/25/2017  . INFLUENZA VACCINE  02/17/2020  . COLONOSCOPY (Pts 45-42yrs Insurance coverage will need to be confirmed)  10/25/2025  . Hepatitis C Screening  Completed  . HPV VACCINES  Aged Out    Health Maintenance  Health Maintenance Due  Topic Date Due  . COVID-19 Vaccine (1) Never done  .  TETANUS/TDAP  Never done  . PNA vac Low Risk Adult (2 of 2 - PCV13) 10/25/2017  . INFLUENZA VACCINE  02/17/2020    Colorectal cancer screening: Type of screening: Colonoscopy. Completed 10/26/2015. Repeat every 10 years  Lung Cancer Screening: (Low Dose CT Chest recommended if Age 91-80 years, 30 pack-year currently smoking OR have quit w/in 15years.) does not qualify.   Lung Cancer Screening Referral: N/A   Additional Screening:  Hepatitis C Screening: does qualify; Completed 07/26/2018  Vision Screening: Recommended annual ophthalmology exams for early detection of glaucoma and other disorders of the eye. Is the patient up to date with their annual eye exam?  No  Who is the provider or what is the name of the office in which the patient attends annual eye exams? N/A  If pt is not established with a provider, would they like to be referred to a provider to establish care? Yes .   Dental Screening: Recommended annual dental exams for proper oral hygiene  Community Resource Referral / Chronic Care Management: CRR required this visit?  No   CCM required this visit?  No      Plan:     I have personally reviewed and noted the following in the patient's chart:   . Medical and social history . Use of alcohol, tobacco or illicit drugs  . Current medications and supplements . Functional ability and status . Nutritional status . Physical activity . Advanced directives . List of other physicians . Hospitalizations, surgeries, and ER visits in previous 12 months . Vitals . Screenings to include cognitive, depression, and falls . Referrals and appointments  In addition, I have reviewed and discussed with patient certain preventive protocols, quality metrics, and best practice recommendations. A written personalized care plan for preventive services as well as general preventive health recommendations were provided to patient.     Ofilia Neas, LPN   2/99/2426   Nurse Notes:   Please see patient's score on the 6Cit performed today. Patient has concerns of memory loss

## 2020-10-01 ENCOUNTER — Ambulatory Visit (INDEPENDENT_AMBULATORY_CARE_PROVIDER_SITE_OTHER): Payer: Medicare HMO

## 2020-10-01 DIAGNOSIS — Z0100 Encounter for examination of eyes and vision without abnormal findings: Secondary | ICD-10-CM | POA: Diagnosis not present

## 2020-10-01 DIAGNOSIS — Z0000 Encounter for general adult medical examination without abnormal findings: Secondary | ICD-10-CM | POA: Diagnosis not present

## 2020-10-01 NOTE — Patient Instructions (Signed)
[name] , Thank you for taking time to come for your Medicare Wellness Visit. I appreciate your ongoing commitment to your health goals. Please review the following plan we discussed and let me know if I can assist you in the future.   Screening recommendations/referrals: Colonoscopy: up to date, next due 10/25/2025 Recommended yearly ophthalmology/optometry visit for glaucoma screening and checkup Recommended yearly dental visit for hygiene and checkup  Vaccinations: Influenza vaccine: Currently due if you would like to receive you may do so at your local pharmacy or make and appointment with our office  Pneumococcal vaccine: Currently due for Prenvar 13, you may receive at your next in person office visit  Tdap vaccine: Currently due you may await and injury to receive  Shingles vaccine: Currently due, if you would like to receive we recommend that you do so at your local pharmacy as it is less expensive     Advanced directives: Advance directive discussed with you today. Even though you declined this today please call our office should you change your mind and we can give you the proper paperwork for you to fill out.   Conditions/risks identified: None   Next appointment: As requested we will call you closer to the time that your next medicare wellness visit is due.   Preventive Care 58 Years and Older, Male Preventive care refers to lifestyle choices and visits with your health care provider that can promote health and wellness. What does preventive care include?  A yearly physical exam. This is also called an annual well check.  Dental exams once or twice a year.  Routine eye exams. Ask your health care provider how often you should have your eyes checked.  Personal lifestyle choices, including:  Daily care of your teeth and gums.  Regular physical activity.  Eating a healthy diet.  Avoiding tobacco and drug use.  Limiting alcohol use.  Practicing safe  sex.  Taking low doses of aspirin every day.  Taking vitamin and mineral supplements as recommended by your health care provider. What happens during an annual well check? The services and screenings done by your health care provider during your annual well check will depend on your age, overall health, lifestyle risk factors, and family history of disease. Counseling  Your health care provider may ask you questions about your:  Alcohol use.  Tobacco use.  Drug use.  Emotional well-being.  Home and relationship well-being.  Sexual activity.  Eating habits.  History of falls.  Memory and ability to understand (cognition).  Work and work Statistician. Screening  You may have the following tests or measurements:  Height, weight, and BMI.  Blood pressure.  Lipid and cholesterol levels. These may be checked every 5 years, or more frequently if you are over 81 years old.  Skin check.  Lung cancer screening. You may have this screening every year starting at age 82 if you have a 30-pack-year history of smoking and currently smoke or have quit within the past 15 years.  Fecal occult blood test (FOBT) of the stool. You may have this test every year starting at age 26.  Flexible sigmoidoscopy or colonoscopy. You may have a sigmoidoscopy every 5 years or a colonoscopy every 10 years starting at age 56.  Prostate cancer screening. Recommendations will vary depending on your family history and other risks.  Hepatitis C blood test.  Hepatitis B blood test.  Sexually transmitted disease (STD) testing.  Diabetes screening. This is done by checking your blood sugar (  glucose) after you have not eaten for a while (fasting). You may have this done every 1-3 years.  Abdominal aortic aneurysm (AAA) screening. You may need this if you are a current or former smoker.  Osteoporosis. You may be screened starting at age 17 if you are at high risk. Talk with your health care provider  about your test results, treatment options, and if necessary, the need for more tests. Vaccines  Your health care provider may recommend certain vaccines, such as:  Influenza vaccine. This is recommended every year.  Tetanus, diphtheria, and acellular pertussis (Tdap, Td) vaccine. You may need a Td booster every 10 years.  Zoster vaccine. You may need this after age 88.  Pneumococcal 13-valent conjugate (PCV13) vaccine. One dose is recommended after age 55.  Pneumococcal polysaccharide (PPSV23) vaccine. One dose is recommended after age 61. Talk to your health care provider about which screenings and vaccines you need and how often you need them. This information is not intended to replace advice given to you by your health care provider. Make sure you discuss any questions you have with your health care provider. Document Released: 08/01/2015 Document Revised: 03/24/2016 Document Reviewed: 05/06/2015 Elsevier Interactive Patient Education  2017 [place] Prevention in the Home Falls can cause injuries. They can happen to people of all ages. There are many things you can do to make your home safe and to help prevent falls. What can I do on the outside of my home?  Regularly fix the edges of walkways and driveways and fix any cracks.  Remove anything that might make you trip as you walk through a door, such as a raised step or threshold.  Trim any bushes or trees on the path to your home.  Use bright outdoor lighting.  Clear any walking paths of anything that might make someone trip, such as rocks or tools.  Regularly check to see if handrails are loose or broken. Make sure that both sides of any steps have handrails.  Any raised decks and porches should have guardrails on the edges.  Have any leaves, snow, or ice cleared regularly.  Use sand or salt on walking paths during winter.  Clean up any spills in your garage right away. This includes oil or grease  spills. What can I do in the bathroom?  Use night lights.  Install grab bars by the toilet and in the tub and shower. Do not use towel bars as grab bars.  Use non-skid mats or decals in the tub or shower.  If you need to sit down in the shower, use a plastic, non-slip stool.  Keep the floor dry. Clean up any water that spills on the floor as soon as it happens.  Remove soap buildup in the tub or shower regularly.  Attach bath mats securely with double-sided non-slip rug tape.  Do not have throw rugs and other things on the floor that can make you trip. What can I do in the bedroom?  Use night lights.  Make sure that you have a light by your bed that is easy to reach.  Do not use any sheets or blankets that are too big for your bed. They should not hang down onto the floor.  Have a firm chair that has side arms. You can use this for support while you get dressed.  Do not have throw rugs and other things on the floor that can make you trip. What can I do in the kitchen?  Clean up any spills right away.  Avoid walking on wet floors.  Keep items that you use a lot in easy-to-reach places.  If you need to reach something above you, use a strong step stool that has a grab bar.  Keep electrical cords out of the way.  Do not use floor polish or wax that makes floors slippery. If you must use wax, use non-skid floor wax.  Do not have throw rugs and other things on the floor that can make you trip. What can I do with my stairs?  Do not leave any items on the stairs.  Make sure that there are handrails on both sides of the stairs and use them. Fix handrails that are broken or loose. Make sure that handrails are as long as the stairways.  Check any carpeting to make sure that it is firmly attached to the stairs. Fix any carpet that is loose or worn.  Avoid having throw rugs at the top or bottom of the stairs. If you do have throw rugs, attach them to the floor with carpet  tape.  Make sure that you have a light switch at the top of the stairs and the bottom of the stairs. If you do not have them, ask someone to add them for you. What else can I do to help prevent falls?  Wear shoes that:  Do not have high heels.  Have rubber bottoms.  Are comfortable and fit you well.  Are closed at the toe. Do not wear sandals.  If you use a stepladder:  Make sure that it is fully opened. Do not climb a closed stepladder.  Make sure that both sides of the stepladder are locked into place.  Ask someone to hold it for you, if possible.  Clearly mark and make sure that you can see:  Any grab bars or handrails.  First and last steps.  Where the edge of each step is.  Use tools that help you move around (mobility aids) if they are needed. These include:  Canes.  Walkers.  Scooters.  Crutches.  Turn on the lights when you go into a dark area. Replace any light bulbs as soon as they burn out.  Set up your furniture so you have a clear path. Avoid moving your furniture around.  If any of your floors are uneven, fix them.  If there are any pets around you, be aware of where they are.  Review your medicines with your doctor. Some medicines can make you feel dizzy. This can increase your chance of falling. Ask your doctor what other things that you can do to help prevent falls. This information is not intended to replace advice given to you by your health care provider. Make sure you discuss any questions you have with your health care provider. Document Released: 05/01/2009 Document Revised: 12/11/2015 Document Reviewed: 08/09/2014 Elsevier Interactive Patient Education  2017 [employer].

## 2020-10-02 ENCOUNTER — Other Ambulatory Visit: Payer: Self-pay | Admitting: Family Medicine

## 2020-10-02 ENCOUNTER — Other Ambulatory Visit: Payer: Self-pay | Admitting: Physical Medicine & Rehabilitation

## 2020-10-02 DIAGNOSIS — F419 Anxiety disorder, unspecified: Secondary | ICD-10-CM

## 2020-10-02 DIAGNOSIS — I619 Nontraumatic intracerebral hemorrhage, unspecified: Secondary | ICD-10-CM

## 2020-10-15 ENCOUNTER — Other Ambulatory Visit: Payer: Self-pay | Admitting: Physical Medicine & Rehabilitation

## 2020-10-15 DIAGNOSIS — I619 Nontraumatic intracerebral hemorrhage, unspecified: Secondary | ICD-10-CM

## 2020-10-15 DIAGNOSIS — F419 Anxiety disorder, unspecified: Secondary | ICD-10-CM

## 2020-10-15 DIAGNOSIS — F32A Depression, unspecified: Secondary | ICD-10-CM

## 2020-10-27 ENCOUNTER — Other Ambulatory Visit: Payer: Self-pay | Admitting: Physical Medicine & Rehabilitation

## 2020-10-27 ENCOUNTER — Other Ambulatory Visit: Payer: Self-pay | Admitting: Family Medicine

## 2020-10-27 DIAGNOSIS — F419 Anxiety disorder, unspecified: Secondary | ICD-10-CM

## 2020-10-27 DIAGNOSIS — I619 Nontraumatic intracerebral hemorrhage, unspecified: Secondary | ICD-10-CM

## 2020-10-27 MED ORDER — PANTOPRAZOLE SODIUM 40 MG PO TBEC: DELAYED_RELEASE_TABLET | ORAL | 0 refills | Status: DC

## 2020-10-27 NOTE — Telephone Encounter (Signed)
I approved the Protonix, but I cannot remove the other 2 or selective sign.

## 2020-11-10 ENCOUNTER — Telehealth: Payer: Self-pay

## 2020-11-10 ENCOUNTER — Telehealth: Payer: Self-pay | Admitting: Family Medicine

## 2020-11-10 DIAGNOSIS — F5101 Primary insomnia: Secondary | ICD-10-CM

## 2020-11-10 MED ORDER — QUETIAPINE FUMARATE 200 MG PO TABS: 200.0000 mg | ORAL_TABLET | Freq: Every day | ORAL | 0 refills | Status: DC

## 2020-11-10 NOTE — Telephone Encounter (Signed)
Patient is needing a refill on all his medications. I asked for specific medication names and he said that the pharmacy told him to contact his doctor and ask that all of his medication to be refill and sent to:  [place], [place] Phone:  [phone]  Fax:  [phone]

## 2020-11-10 NOTE — Telephone Encounter (Signed)
Patient called requesting refill on Seroquel. I called patient stating he needs an appt before refill sent. Will schedule patient for 12/10/20. Sending refill.

## 2020-11-11 ENCOUNTER — Other Ambulatory Visit: Payer: Self-pay

## 2020-11-11 DIAGNOSIS — I10 Essential (primary) hypertension: Secondary | ICD-10-CM

## 2020-11-11 MED ORDER — HYDRALAZINE HCL 25 MG PO TABS: 25.0000 mg | ORAL_TABLET | Freq: Three times a day (TID) | ORAL | 0 refills | Status: DC

## 2020-11-11 MED ORDER — CHLORTHALIDONE 25 MG PO TABS: 25.0000 mg | ORAL_TABLET | Freq: Every day | ORAL | 0 refills | Status: DC

## 2020-11-11 MED ORDER — POTASSIUM CHLORIDE CRYS ER 20 MEQ PO TBCR: 20.0000 meq | EXTENDED_RELEASE_TABLET | Freq: Every day | ORAL | 0 refills | Status: DC

## 2020-11-11 MED ORDER — FERROUS SULFATE 325 (65 FE) MG PO TABS
ORAL_TABLET | ORAL | 0 refills | Status: DC
Start: 2020-11-11 — End: 2021-01-01

## 2020-11-11 NOTE — Telephone Encounter (Signed)
Checked medications and sent refills for those needed to requested pharmacy.

## 2020-12-04 ENCOUNTER — Ambulatory Visit: Payer: Medicare HMO | Admitting: Family Medicine

## 2020-12-10 ENCOUNTER — Encounter: Payer: Medicare HMO | Attending: Physical Medicine & Rehabilitation | Admitting: Physical Medicine & Rehabilitation

## 2020-12-10 ENCOUNTER — Other Ambulatory Visit: Payer: Self-pay

## 2020-12-11 ENCOUNTER — Encounter: Payer: Self-pay | Admitting: Family Medicine

## 2020-12-11 ENCOUNTER — Ambulatory Visit (INDEPENDENT_AMBULATORY_CARE_PROVIDER_SITE_OTHER): Payer: Medicare HMO | Admitting: Family Medicine

## 2020-12-11 VITALS — BP 126/64 | HR 84 | Temp 98.2°F | Ht 69.0 in

## 2020-12-11 DIAGNOSIS — L89321 Pressure ulcer of left buttock, stage 1: Secondary | ICD-10-CM | POA: Diagnosis not present

## 2020-12-11 DIAGNOSIS — R2689 Other abnormalities of gait and mobility: Secondary | ICD-10-CM | POA: Diagnosis not present

## 2020-12-11 DIAGNOSIS — R481 Agnosia: Secondary | ICD-10-CM | POA: Diagnosis not present

## 2020-12-11 DIAGNOSIS — Z8673 Personal history of transient ischemic attack (TIA), and cerebral infarction without residual deficits: Secondary | ICD-10-CM | POA: Diagnosis not present

## 2020-12-11 NOTE — Progress Notes (Signed)
Subjective:    Patient ID: [name], male    DOB: 12-13-1950, 70 y.o.   MRN: [id number]  Chief Complaint  Patient presents with  . Rash    Patient complains of rash on flank, xmonths,     HPI Patient was seen today for ongoing concens as mentioned above.  Patient endorses rash butt.  Tried anything for it.  At times area is pruritic.  Changing adult diapers regularly.  Mostly sits during the day.  Patient has a form form his home aid requesting increased hours.  Patient inquires about referral to ENT for continued loss of taste and smell.  States does not recall being contacted about the appointment.  Samul Dada about returning to therapy.  Patient states phone is currently off but will be back on June 1.  Past Medical History:  Diagnosis Date  . Arthritis   . Cameron ulcer, chronic 01/11/2018  . Chronic back pain   . GERD (gastroesophageal reflux disease)    uses Baking Soda  . GIB (gastrointestinal bleeding) 2012  . Glaucoma    right eye  . Headache    occasionally  . Hepatitis C   . Hiatal hernia with gastroesophageal reflux disease and esophagitis 01/05/2018  . History of blood transfusion    no abnormal reaction noted  . History of gout    doesn't take any meds  . Hyperlipidemia    not on any meds  . Hypertension    takes Amlodipine and Lisinopril daily  . Insomnia    takes Ambien nightly  . Ischemic colitis ([place]) 2012  . Joint pain   . Nocturia   . Numbness    both legs occasionally  . PONV (postoperative nausea and vomiting)   . Prostate cancer ([place])   . Shortness of breath dyspnea    rarely but when notices he can be lying/sitting/exertion.Dr.Hochrein is aware per pt  . Stroke ([place]) 2016   . Urinary frequency   . Urinary urgency     Allergies  Allergen Reactions  . Penicillins Anaphylaxis    Has patient had a PCN reaction causing immediate rash, facial/tongue/throat swelling, SOB or lightheadedness with hypotension: Yes Has patient had a PCN reaction  causing severe rash involving mucus membranes or skin necrosis: No Has patient had a PCN reaction that required hospitalization: No Has patient had a PCN reaction occurring within the last 10 years: No If all of the above answers are "NO", then may proceed with Cephalosporin use..  . Sudafed [Pseudoephedrine] Other (See Comments)    Heart "races"    ROS General: Denies fever, chills, night sweats, changes in weight, changes in appetite HEENT: Denies headaches, ear pain, changes in vision, rhinorrhea, sore throat CV: Denies CP, palpitations, SOB, orthopnea Pulm: Denies SOB, cough, wheezing GI: Denies abdominal pain, nausea, vomiting, diarrhea, constipation GU: Denies dysuria, hematuria, frequency Msk: Denies muscle cramps, joint pains Neuro: Denies weakness, numbness, tingling Skin: Denies bruising + rash Psych: Denies depression, anxiety, hallucinations     Objective:    Blood pressure 126/64, pulse 84, temperature 98.2 F (36.8 C), temperature source Oral, height 5\' 9"  (1.753 m), SpO2 98 %.  Gen. Pleasant, well-nourished, in no distress, normal affect  HEENT: [place]/AT, face symmetric, conjunctiva clear, no scleral icterus, PERRLA, EOMI, wearing a mask Lungs: no accessory muscle use Cardiovascular: RRR, no peripheral edema Musculoskeletal: No deformities, no cyanosis or clubbing, normal tone Neuro:  A&Ox3, CN II-XII intact, sitting in motorized wheelchair.  Able to stand and pivot. Skin:  Warm, no lesions/ rash.  Palpable rash, dry skin, or erythema.  A healing 5 mm ulcer on left medial buttock in gluteal cleft.  A small whitish/tan insect seen moving across cushion in seat of wheelchair.   Wt Readings from Last 3 Encounters:  07/30/20 173 lb (78.5 kg)  03/31/20 179 lb (81.2 kg)  03/20/20 187 lb 2.7 oz (84.9 kg)    Lab Results  Component Value Date   WBC 6.3 09/22/2020   HGB 10.3 (A) 09/22/2020   HCT 33 (A) 09/22/2020   PLT 331 09/22/2020   GLUCOSE 80 07/30/2020   CHOL  102 07/22/2018   TRIG 108 07/22/2018   HDL 35 (L) 07/22/2018   LDLCALC 45 07/22/2018   ALT 19 09/22/2020   AST 14 09/22/2020   NA 139 09/22/2020   K 4.3 09/22/2020   CL 109 (A) 09/22/2020   CREATININE 1.3 09/22/2020   BUN 15 07/30/2020   CO2 23 (A) 09/22/2020   TSH 0.86 07/30/2020   INR 1.23 07/20/2018   HGBA1C 4.7 (L) 07/22/2018    Assessment/Plan:  Pressure injury of left buttock, stage 1 -Patient advised to avoid prolonged sitting -sacral heart and wound care. - Plan: AMB referral to wound care center  Decreased mobility -Currently in power wheelchair -Continue follow-up with PM&R -Restart rehab as BP better controlled -Completed for PCS  - Plan: AMB referral to rehabilitation  Agnosia for smell  - Plan: Ambulatory referral to ENT  Agnosia for taste  - Plan: Ambulatory referral to ENT  History of CVA (cerebrovascular accident)  -Continue current medications - Plan: AMB referral to rehabilitation  F/u as needed.   [name], MD

## 2020-12-12 ENCOUNTER — Telehealth: Payer: Self-pay | Admitting: Family Medicine

## 2020-12-12 ENCOUNTER — Other Ambulatory Visit: Payer: Self-pay | Admitting: Family Medicine

## 2020-12-12 NOTE — Telephone Encounter (Signed)
Spoke with [name], received form and discussed what sections needed to be filled out. Completed form and faxed to [facility].

## 2020-12-12 NOTE — Telephone Encounter (Signed)
Otila Kluver w/[facility] is calling stating that the form that was filled out for the pt was filled out incorrectly and they are going to send a new one to be filled out.  Otila Kluver would like to have a call back to assist the person that will be filling out the form b/c pt is trying to get a increase in service and that information needs to go in Section [place]3 [facility] will not accept the form) and it was not done.  Otila Kluver is faxing the new form over to my attn to give to Dr. Volanda Napoleon CMA.

## 2020-12-23 ENCOUNTER — Telehealth: Payer: Self-pay | Admitting: Family Medicine

## 2020-12-23 ENCOUNTER — Other Ambulatory Visit: Payer: Self-pay

## 2020-12-23 NOTE — Telephone Encounter (Signed)
Pt call and stated the place that dr.Banks sent him they don't take medicaid and want to know what to do.

## 2020-12-24 ENCOUNTER — Inpatient Hospital Stay (HOSPITAL_COMMUNITY)
Admission: EM | Admit: 2020-12-24 | Discharge: 2020-12-27 | DRG: 369 | Disposition: A | Discharge status: Home / Self Care | Payer: Medicare HMO | Attending: Internal Medicine | Admitting: Internal Medicine

## 2020-12-24 ENCOUNTER — Encounter (HOSPITAL_COMMUNITY): Payer: Self-pay

## 2020-12-24 ENCOUNTER — Encounter: Payer: Self-pay | Admitting: Family Medicine

## 2020-12-24 ENCOUNTER — Ambulatory Visit (INDEPENDENT_AMBULATORY_CARE_PROVIDER_SITE_OTHER): Payer: Medicare HMO | Admitting: Family Medicine

## 2020-12-24 ENCOUNTER — Other Ambulatory Visit: Payer: Self-pay

## 2020-12-24 ENCOUNTER — Telehealth: Payer: Self-pay

## 2020-12-24 VITALS — BP 120/60 | HR 77 | Temp 97.8°F

## 2020-12-24 DIAGNOSIS — K4090 Unilateral inguinal hernia, without obstruction or gangrene, not specified as recurrent: Secondary | ICD-10-CM | POA: Diagnosis present

## 2020-12-24 DIAGNOSIS — K449 Diaphragmatic hernia without obstruction or gangrene: Secondary | ICD-10-CM | POA: Diagnosis present

## 2020-12-24 DIAGNOSIS — Z993 Dependence on wheelchair: Secondary | ICD-10-CM

## 2020-12-24 DIAGNOSIS — Z79899 Other long term (current) drug therapy: Secondary | ICD-10-CM

## 2020-12-24 DIAGNOSIS — Z8673 Personal history of transient ischemic attack (TIA), and cerebral infarction without residual deficits: Secondary | ICD-10-CM | POA: Diagnosis not present

## 2020-12-24 DIAGNOSIS — R262 Difficulty in walking, not elsewhere classified: Secondary | ICD-10-CM | POA: Diagnosis present

## 2020-12-24 DIAGNOSIS — G8929 Other chronic pain: Secondary | ICD-10-CM | POA: Diagnosis present

## 2020-12-24 DIAGNOSIS — K2970 Gastritis, unspecified, without bleeding: Secondary | ICD-10-CM | POA: Diagnosis present

## 2020-12-24 DIAGNOSIS — Z20822 Contact with and (suspected) exposure to covid-19: Secondary | ICD-10-CM | POA: Diagnosis not present

## 2020-12-24 DIAGNOSIS — Z888 Allergy status to other drugs, medicaments and biological substances status: Secondary | ICD-10-CM

## 2020-12-24 DIAGNOSIS — I10 Essential (primary) hypertension: Secondary | ICD-10-CM | POA: Diagnosis not present

## 2020-12-24 DIAGNOSIS — Z820 Family history of epilepsy and other diseases of the nervous system: Secondary | ICD-10-CM

## 2020-12-24 DIAGNOSIS — R5383 Other fatigue: Secondary | ICD-10-CM | POA: Diagnosis present

## 2020-12-24 DIAGNOSIS — B1920 Unspecified viral hepatitis C without hepatic coma: Secondary | ICD-10-CM | POA: Diagnosis not present

## 2020-12-24 DIAGNOSIS — D500 Iron deficiency anemia secondary to blood loss (chronic): Secondary | ICD-10-CM | POA: Diagnosis not present

## 2020-12-24 DIAGNOSIS — R600 Localized edema: Secondary | ICD-10-CM

## 2020-12-24 DIAGNOSIS — Z8546 Personal history of malignant neoplasm of prostate: Secondary | ICD-10-CM

## 2020-12-24 DIAGNOSIS — K279 Peptic ulcer, site unspecified, unspecified as acute or chronic, without hemorrhage or perforation: Secondary | ICD-10-CM | POA: Diagnosis present

## 2020-12-24 DIAGNOSIS — K5520 Angiodysplasia of colon without hemorrhage: Secondary | ICD-10-CM

## 2020-12-24 DIAGNOSIS — K319 Disease of stomach and duodenum, unspecified: Secondary | ICD-10-CM | POA: Diagnosis present

## 2020-12-24 DIAGNOSIS — I428 Other cardiomyopathies: Secondary | ICD-10-CM | POA: Diagnosis not present

## 2020-12-24 DIAGNOSIS — K31811 Angiodysplasia of stomach and duodenum with bleeding: Secondary | ICD-10-CM | POA: Diagnosis not present

## 2020-12-24 DIAGNOSIS — R609 Edema, unspecified: Secondary | ICD-10-CM | POA: Diagnosis not present

## 2020-12-24 DIAGNOSIS — F17210 Nicotine dependence, cigarettes, uncomplicated: Secondary | ICD-10-CM | POA: Diagnosis present

## 2020-12-24 DIAGNOSIS — N179 Acute kidney failure, unspecified: Secondary | ICD-10-CM | POA: Diagnosis not present

## 2020-12-24 DIAGNOSIS — Z880 Allergy status to penicillin: Secondary | ICD-10-CM

## 2020-12-24 DIAGNOSIS — E785 Hyperlipidemia, unspecified: Secondary | ICD-10-CM | POA: Diagnosis not present

## 2020-12-24 DIAGNOSIS — M48061 Spinal stenosis, lumbar region without neurogenic claudication: Secondary | ICD-10-CM | POA: Diagnosis present

## 2020-12-24 DIAGNOSIS — Z807 Family history of other malignant neoplasms of lymphoid, hematopoietic and related tissues: Secondary | ICD-10-CM

## 2020-12-24 DIAGNOSIS — Z9079 Acquired absence of other genital organ(s): Secondary | ICD-10-CM

## 2020-12-24 DIAGNOSIS — R079 Chest pain, unspecified: Secondary | ICD-10-CM | POA: Diagnosis present

## 2020-12-24 DIAGNOSIS — Z8249 Family history of ischemic heart disease and other diseases of the circulatory system: Secondary | ICD-10-CM | POA: Diagnosis not present

## 2020-12-24 DIAGNOSIS — K2101 Gastro-esophageal reflux disease with esophagitis, with bleeding: Secondary | ICD-10-CM | POA: Diagnosis not present

## 2020-12-24 DIAGNOSIS — K529 Noninfective gastroenteritis and colitis, unspecified: Secondary | ICD-10-CM | POA: Diagnosis present

## 2020-12-24 DIAGNOSIS — R195 Other fecal abnormalities: Secondary | ICD-10-CM

## 2020-12-24 DIAGNOSIS — M109 Gout, unspecified: Secondary | ICD-10-CM | POA: Diagnosis present

## 2020-12-24 DIAGNOSIS — Z635 Disruption of family by separation and divorce: Secondary | ICD-10-CM

## 2020-12-24 DIAGNOSIS — Z8711 Personal history of peptic ulcer disease: Secondary | ICD-10-CM

## 2020-12-24 DIAGNOSIS — D649 Anemia, unspecified: Secondary | ICD-10-CM | POA: Diagnosis present

## 2020-12-24 DIAGNOSIS — Z720 Tobacco use: Secondary | ICD-10-CM | POA: Diagnosis present

## 2020-12-24 LAB — IRON AND TIBC
Iron: 13 ug/dL — ABNORMAL LOW (ref 45–182)
Saturation Ratios: 3 % — ABNORMAL LOW (ref 17.9–39.5)
TIBC: 392 ug/dL (ref 250–450)
UIBC: 379 ug/dL

## 2020-12-24 LAB — HEPATIC FUNCTION PANEL
ALT: 11 U/L (ref 0–44)
AST: 14 U/L — ABNORMAL LOW (ref 15–41)
Albumin: 3.1 g/dL — ABNORMAL LOW (ref 3.5–5.0)
Alkaline Phosphatase: 90 U/L (ref 38–126)
Bilirubin, Direct: <0.1 mg/dL (ref 0.0–0.2)
Total Bilirubin: 0.3 mg/dL (ref 0.3–1.2)
Total Protein: 6 g/dL — ABNORMAL LOW (ref 6.5–8.1)

## 2020-12-24 LAB — BASIC METABOLIC PANEL
Anion gap: 8 (ref 5–15)
BUN: 29 mg/dL — ABNORMAL HIGH (ref 8–23)
CO2: 20 mmol/L — ABNORMAL LOW (ref 22–32)
Calcium: 8.5 mg/dL — ABNORMAL LOW (ref 8.9–10.3)
Chloride: 109 mmol/L (ref 98–111)
Creatinine, Ser: 1.4 mg/dL — ABNORMAL HIGH (ref 0.61–1.24)
GFR, Estimated: 54 mL/min — ABNORMAL LOW (ref >60)
Glucose, Bld: 90 mg/dL (ref 70–99)
Potassium: 4.1 mmol/L (ref 3.5–5.1)
Sodium: 137 mmol/L (ref 135–145)

## 2020-12-24 LAB — COMPREHENSIVE METABOLIC PANEL
ALT: 9 U/L (ref 0–53)
AST: 10 U/L (ref 0–37)
Albumin: 3.8 g/dL (ref 3.5–5.2)
Alkaline Phosphatase: 102 U/L (ref 39–117)
BUN: 33 mg/dL — ABNORMAL HIGH (ref 6–23)
CO2: 23 mEq/L (ref 19–32)
Calcium: 8.5 mg/dL (ref 8.4–10.5)
Chloride: 108 mEq/L (ref 96–112)
Creatinine, Ser: 1.4 mg/dL (ref 0.40–1.50)
GFR: 50.48 mL/min — ABNORMAL LOW (ref >60.00)
Glucose, Bld: 95 mg/dL (ref 70–99)
Potassium: 4.4 mEq/L (ref 3.5–5.1)
Sodium: 138 mEq/L (ref 135–145)
Total Bilirubin: 0.3 mg/dL (ref 0.2–1.2)
Total Protein: 6.4 g/dL (ref 6.0–8.3)

## 2020-12-24 LAB — CBC WITH DIFFERENTIAL/PLATELET
Abs Immature Granulocytes: 0 10*3/uL (ref 0.00–0.07)
Basophils Absolute: 0 10*3/uL (ref 0.0–0.1)
Basophils Absolute: 0.1 10*3/uL (ref 0.0–0.1)
Basophils Relative: 0.4 % (ref 0.0–3.0)
Basophils Relative: 2 %
Eosinophils Absolute: 0 10*3/uL (ref 0.0–0.7)
Eosinophils Absolute: 0.1 10*3/uL (ref 0.0–0.5)
Eosinophils Relative: 0.7 % (ref 0.0–5.0)
Eosinophils Relative: 2 %
HCT: 13.6 % — CL (ref 39.0–52.0)
HCT: 14.2 % — ABNORMAL LOW (ref 39.0–52.0)
Hemoglobin: 3.8 g/dL — CL (ref 13.0–17.0)
Hemoglobin: 3.7 g/dL — CL (ref 13.0–17.0)
Lymphocytes Relative: 33.4 % (ref 12.0–46.0)
Lymphocytes Relative: 14 %
Lymphs Abs: 1.5 10*3/uL (ref 0.7–4.0)
Lymphs Abs: 0.7 10*3/uL (ref 0.7–4.0)
MCH: 20.7 pg — ABNORMAL LOW (ref 26.0–34.0)
MCHC: 28.3 g/dL — ABNORMAL LOW (ref 30.0–36.0)
MCHC: 26.1 g/dL — ABNORMAL LOW (ref 30.0–36.0)
MCV: 72.9 fl — ABNORMAL LOW (ref 78.0–100.0)
MCV: 79.3 fL — ABNORMAL LOW (ref 80.0–100.0)
Monocytes Absolute: 0.3 10*3/uL (ref 0.1–1.0)
Monocytes Absolute: 0.2 10*3/uL (ref 0.1–1.0)
Monocytes Relative: 7.6 % (ref 3.0–12.0)
Monocytes Relative: 4 %
Neutro Abs: 2.6 10*3/uL (ref 1.4–7.7)
Neutro Abs: 4 10*3/uL (ref 1.7–7.7)
Neutrophils Relative %: 57.9 % (ref 43.0–77.0)
Neutrophils Relative %: 78 %
Platelets: 481 10*3/uL — ABNORMAL HIGH (ref 150.0–400.0)
Platelets: 460 10*3/uL — ABNORMAL HIGH (ref 150–400)
RBC: 1.86 Mil/uL — ABNORMAL LOW (ref 4.22–5.81)
RBC: 1.79 MIL/uL — ABNORMAL LOW (ref 4.22–5.81)
RDW: 26.4 % — ABNORMAL HIGH (ref 11.5–15.5)
RDW: 25.1 % — ABNORMAL HIGH (ref 11.5–15.5)
WBC: 4.4 10*3/uL (ref 4.0–10.5)
WBC: 5.1 10*3/uL (ref 4.0–10.5)
nRBC: 0 /100 WBC
nRBC: 1 % — ABNORMAL HIGH (ref 0.0–0.2)

## 2020-12-24 LAB — TROPONIN I (HIGH SENSITIVITY)
Troponin I (High Sensitivity): 6 ng/L (ref <18)
Troponin I (High Sensitivity): 6 ng/L (ref <18)

## 2020-12-24 LAB — RETICULOCYTES
Immature Retic Fract: 32.3 % — ABNORMAL HIGH (ref 2.3–15.9)
RBC.: 1.84 MIL/uL — ABNORMAL LOW (ref 4.22–5.81)
Retic Count, Absolute: 157.5 10*3/uL (ref 19.0–186.0)
Retic Ct Pct: 8.6 % — ABNORMAL HIGH (ref 0.4–3.1)

## 2020-12-24 LAB — FOLATE: Folate: 20 ng/mL (ref >5.9)

## 2020-12-24 LAB — FERRITIN: Ferritin: 3 ng/mL — ABNORMAL LOW (ref 24–336)

## 2020-12-24 LAB — TSH: TSH: 1.01 u[IU]/mL (ref 0.35–4.50)

## 2020-12-24 LAB — T4, FREE: Free T4: 0.64 ng/dL (ref 0.60–1.60)

## 2020-12-24 LAB — BRAIN NATRIURETIC PEPTIDE: Pro B Natriuretic peptide (BNP): 65 pg/mL (ref 0.0–100.0)

## 2020-12-24 LAB — VITAMIN B12: Vitamin B-12: 534 pg/mL (ref 180–914)

## 2020-12-24 LAB — PREPARE RBC (CROSSMATCH)

## 2020-12-24 MED ORDER — BUPROPION HCL ER (SR) 100 MG PO TB12
200.0000 mg | ORAL_TABLET | Freq: Every day | ORAL | Status: DC
  Administered 2020-12-25 – 2020-12-27 (×3): 200 mg via ORAL
  Filled 2020-12-24 (×3): qty 2

## 2020-12-24 MED ORDER — ACETAMINOPHEN 650 MG RE SUPP: 650.0000 mg | Freq: Four times a day (QID) | RECTAL | Status: DC | PRN

## 2020-12-24 MED ORDER — SENNOSIDES-DOCUSATE SODIUM 8.6-50 MG PO TABS: 1.0000 | ORAL_TABLET | Freq: Every evening | ORAL | Status: DC | PRN

## 2020-12-24 MED ORDER — HYDRALAZINE HCL 25 MG PO TABS
25.0000 mg | ORAL_TABLET | Freq: Three times a day (TID) | ORAL | Status: DC
  Administered 2020-12-24: 25 mg via ORAL
  Filled 2020-12-24: qty 1

## 2020-12-24 MED ORDER — NICOTINE 7 MG/24HR TD PT24
7.0000 mg | MEDICATED_PATCH | Freq: Every day | TRANSDERMAL | Status: DC
  Administered 2020-12-25 – 2020-12-26 (×2): 7 mg via TRANSDERMAL
  Filled 2020-12-24 (×3): qty 1

## 2020-12-24 MED ORDER — PANTOPRAZOLE SODIUM 40 MG IV SOLR
40.0000 mg | Freq: Once | INTRAVENOUS | Status: AC
  Administered 2020-12-24: 40 mg via INTRAVENOUS
  Filled 2020-12-24: qty 40

## 2020-12-24 MED ORDER — CHLORTHALIDONE 25 MG PO TABS
25.0000 mg | ORAL_TABLET | Freq: Every day | ORAL | Status: DC
  Filled 2020-12-24: qty 1

## 2020-12-24 MED ORDER — SODIUM CHLORIDE 0.9 % IV SOLN
10.0000 mL/h | Freq: Once | INTRAVENOUS | Status: AC
  Administered 2020-12-24: 10 mL/h via INTRAVENOUS

## 2020-12-24 MED ORDER — ACETAMINOPHEN 325 MG PO TABS: 650.0000 mg | ORAL_TABLET | Freq: Four times a day (QID) | ORAL | Status: DC | PRN

## 2020-12-24 MED ORDER — LACTATED RINGERS IV SOLN: INTRAVENOUS | Status: DC

## 2020-12-24 MED ORDER — PANTOPRAZOLE SODIUM 40 MG IV SOLR
40.0000 mg | Freq: Two times a day (BID) | INTRAVENOUS | Status: DC
  Administered 2020-12-24 – 2020-12-25 (×3): 40 mg via INTRAVENOUS
  Filled 2020-12-24 (×3): qty 40

## 2020-12-24 MED ORDER — FERROUS SULFATE 325 (65 FE) MG PO TABS
325.0000 mg | ORAL_TABLET | Freq: Three times a day (TID) | ORAL | Status: DC
  Administered 2020-12-25 – 2020-12-27 (×6): 325 mg via ORAL
  Filled 2020-12-24 (×6): qty 1

## 2020-12-24 NOTE — Plan of Care (Signed)
Admitted patient from ED receiving first unit of blood. Patient denies fever, chills,  or pain. Safety precautions maintained.

## 2020-12-24 NOTE — Telephone Encounter (Signed)
CRITICAL VALUE STICKER  CRITICAL VALUE: hematocrit 13.6                                Hemoglobin 3.8  RECEIVER (on-site recipient of call): [name]  DATE & TIME NOTIFIED: 12/24/20  2:10 pm  MESSENGER (representative from lab): Malcom  MD NOTIFIED: Volanda Napoleon  TIME OF NOTIFICATION: 2:18pm  RESPONSE: patient needs to go to nearest ED.  Spoke with patient, informed of critical report, advised needs to go to nearest ED, pt verbalized understanding.

## 2020-12-24 NOTE — ED Triage Notes (Signed)
Patient states that he was called by his MD and told to come to ED for low hemoglobin. patient reports that he has had multiple transfusions in past for same. denies sob, no CP, does have bilateral ankle and feet swelling x 1 week

## 2020-12-24 NOTE — ED Provider Notes (Signed)
Emergency Medicine Provider Triage Evaluation Note  [name] , a 70 y.o. male  was evaluated in triage.  Pt complains of abnormal lab. Had labwork done by his pcp and was told he was anemic. Also c/o ble edema for 4 days. Denies chest pain or sob  Review of Systems  Positive: Anemia, ble edema Negative: Chest pain, sob  Physical Exam  BP (!) 112/59 (BP Location: Right Arm)   Pulse 87   Temp 98.3 F (36.8 C) (Oral)   Resp 18   SpO2 99%  Gen:   Awake, no distress   Resp:  Normal effort  MSK:   Moves extremities without difficulty  Other:  ble edema, pale conjunctiva  Medical Decision Making  Medically screening exam initiated at 4:35 PM.  Appropriate orders placed.  [name] was informed that the remainder of the evaluation will be completed by another provider, this initial triage assessment does not replace that evaluation, and the importance of remaining in the ED until their evaluation is complete.     Rodney Booze, PA-C 12/24/20 [id number]    Lajean Saver, MD 12/26/20 [phone]

## 2020-12-24 NOTE — Telephone Encounter (Signed)
Patient is currently in with Dr Volanda Napoleon, will discuss.

## 2020-12-24 NOTE — H&P (Signed)
History and Physical    [name] BJY:[id number] DOB: August 24, 1950 DOA: 12/24/2020  PCP: Billie Ruddy, MD   Patient coming from: Home via PCP office  Chief Complaint: low blood count  HPI: [name] is a 70 y.o. male with medical history significant for hx of CVA, hx of gastritis/esophagitis, anemia requiring blood transfusions in past, HTN who presents from PCP office after having labs done today that showed a low Hgb level. He went to see his PCP due to swelling of his legs the past few days. He was found to have a Hgb of 3.8. He reports he has felt tired and fatigued lately but has not had any chest pain, SOB, abdominal pain, nausea, vomiting. He states stools have been normal brown color and hasn't noticed any black tarry stools or blood in stool. He reports he had an "area of bleeding in my stomach burned 6-7 months ago at the [facility] hospital". He has needed blood transfusions in the past he states but he is not sure why he did. States his legs have been swelling the past 3 days. He denies any history of CHF or heart disease. He has had CVA in past and is not able to walk. He uses a motorized wheelchair.  No known exposures to COVID-19.  No recent sick contacts. He smokes 1/4-1/2 pack of cigarettes a day.  Denies alcohol or illicit drug use.  Lives alone.  ED Course: [name] has been hemodynamically stable in the emergency room.  Lab work reveals WBC of 5100, hemoglobin 3.7, hematocrit 14.2, platelets 460,000.  Troponin 6, BNP 65, sodium 137, potassium 4.1, chloride 109, bicarb 20, glucose 90, creatinine 1.40, BUN 29, calcium 8.5.  Baseline creatinine is 0.9-1.  TSH 1.01, free T4 0.64  Review of Systems:  General: Reports fatigue. Denies fever, chills, weight loss, night sweats. Denies dizziness. Denies change in appetite HENT: Denies head trauma, headache, denies change in hearing, tinnitus. Denies nasal bleeding. Denies sore throa.  Denies difficulty swallowing Eyes:  Denies blurry vision, pain in eye, drainage.  Denies discoloration of eyes. Neck: Denies pain.  Denies swelling.  Denies pain with movement. Cardiovascular: Denies chest pain, palpitations. Denies edema.  Denies orthopnea Respiratory: Denies shortness of breath, cough. Denies wheezing.  Denies sputum production Gastrointestinal: Denies abdominal pain, swelling. Denies nausea, vomiting, diarrhea. Denies melena.  Denies hematemesis. Musculoskeletal: Denies limitation of movement. Denies deformity. Denies arthralgias or myalgias. Genitourinary: Denies pelvic pain.  Denies urinary frequency or hesitancy.  Denies dysuria.  Skin: Denies rash.  Denies petechiae, purpura, ecchymosis. Neurological: Denies syncope. Denies seizure activity. Denies paresthesia. Denies slurred speech, drooping face. Denies visual change. Psychiatric: Denies depression, anxiety. Denies hallucinations.  Past Medical History:  Diagnosis Date  . Arthritis   . [place] ulcer, chronic 01/11/2018  . Chronic back pain   . GERD (gastroesophageal reflux disease)    uses Baking Soda  . GIB (gastrointestinal bleeding) 2012  . Glaucoma    right eye  . Headache    occasionally  . Hepatitis C   . Hiatal hernia with gastroesophageal reflux disease and esophagitis 01/05/2018  . History of blood transfusion    no abnormal reaction noted  . History of gout    doesn't take any meds  . Hyperlipidemia    not on any meds  . Hypertension    takes Amlodipine and Lisinopril daily  . Insomnia    takes Ambien nightly  . Ischemic colitis ([place]) 2012  . Joint pain   .  Nocturia   . Numbness    both legs occasionally  . PONV (postoperative nausea and vomiting)   . Prostate cancer ([place])   . Shortness of breath dyspnea    rarely but when notices he can be lying/sitting/exertion.Dr.Hochrein is aware per pt  . Stroke ([place]) 2016   . Urinary frequency   . Urinary urgency     Past Surgical History:  Procedure Laterality Date  .  APPENDECTOMY    . BIOPSY  01/11/2018   Procedure: BIOPSY;  Surgeon: Gatha Mayer, MD;  Location: [facility] ENDOSCOPY;  Service: Endoscopy;;  . COLONOSCOPY    . COLONOSCOPY N/A 10/26/2015   Procedure: COLONOSCOPY;  Surgeon: Doran Stabler, MD;  Location[facility] ENDOSCOPY;  Service: Endoscopy;  Laterality: N/A;  . ESOPHAGOGASTRODUODENOSCOPY    . ESOPHAGOGASTRODUODENOSCOPY N/A 10/26/2015   Procedure: ESOPHAGOGASTRODUODENOSCOPY (EGD);  Surgeon: Doran Stabler, MD;  Location: [facility] ENDOSCOPY;  Service: Endoscopy;  Laterality: N/A;  . ESOPHAGOGASTRODUODENOSCOPY (EGD) WITH PROPOFOL N/A 01/11/2018   Procedure: ESOPHAGOGASTRODUODENOSCOPY (EGD) WITH PROPOFOL;  Surgeon: Gatha Mayer, MD;  Location: [facility] ENDOSCOPY;  Service: Endoscopy;  Laterality: N/A;  . INGUINAL HERNIA REPAIR Right 11/04/2014   Procedure: RIGHT INGUINAL HERNIA REPAIR WITH MESH;  Surgeon: Jackolyn Confer, MD;  Location: [place];  Service: General;  Laterality: Right;  . MASS EXCISION N/A 11/04/2014   Procedure: REMOVAL OF RIGHT GROIN SOFT TISSUE MASS;  Surgeon: Jackolyn Confer, MD;  Location: [place];  Service: General;  Laterality: N/A;  . Multiple abdominal surgeries     total of 13  . PROSTATECTOMY    . SMALL INTESTINE SURGERY      Social History  reports that he has been smoking cigarettes. He has a 25.00 pack-year smoking history. He has never used smokeless tobacco. He reports current alcohol use. He reports current drug use. Frequency: 5.00 times per week. Drug: Marijuana.  Allergies  Allergen Reactions  . Penicillins Anaphylaxis    Has patient had a PCN reaction causing immediate rash, facial/tongue/throat swelling, SOB or lightheadedness with hypotension: Yes Has patient had a PCN reaction causing severe rash involving mucus membranes or skin necrosis: No Has patient had a PCN reaction that required hospitalization: No Has patient had a PCN reaction occurring within the last 10 years: No If all of the above answers are "NO", then may  proceed with Cephalosporin use..  . Sudafed [Pseudoephedrine] Other (See Comments)    Heart "races"    Family History  Problem Relation Age of Onset  . Alzheimer's disease Father   . Cancer Mother        Lymph node  . Heart failure Brother 45       Transplant 13 years ago  . CAD Brother   . Heart disease Sister 71       MI     Prior to Admission medications   Medication Sig Start Date End Date Taking? Authorizing Provider  acetaminophen (TYLENOL) 325 MG tablet Take 2 tablets (650 mg total) by mouth 4 (four) times daily -  with meals and at bedtime. 08/18/18   Love, Ivan Anchors, PA-C  Blood Pressure Monitoring (BLOOD PRESSURE MONITOR/L CUFF) MISC Use as directed daily. 12/28/19   Billie Ruddy, MD  buPROPion (WELLBUTRIN SR) 200 MG 12 hr tablet TAKE 1 TABLET BY MOUTH DAILY 10/27/20   Billie Ruddy, MD  chlorthalidone (HYGROTON) 25 MG tablet Take 1 tablet (25 mg total) by mouth daily. 11/11/20   Billie Ruddy, MD  cyclobenzaprine (FLEXERIL)  5 MG tablet TAKE 1 TABLET BY MOUTH TWICE A DAY AS NEEDED FOR MUSCLE SPASMS 10/27/20   Billie Ruddy, MD  ferrous sulfate 325 (65 FE) MG tablet TAKE 1 TABLET BY MOUTH THREE TWICE A DAY WITH MEALS 11/11/20   Billie Ruddy, MD  hydrALAZINE (APRESOLINE) 25 MG tablet Take 1 tablet (25 mg total) by mouth 3 (three) times daily. 11/11/20   Billie Ruddy, MD  Multiple Vitamin (MULTIVITAMINS PO) Take 1 tablet by mouth daily. 06/04/20   [provider]  pantoprazole (PROTONIX) 40 MG tablet TAKE 1 TABLET BY MOUTH TWICE A DAY BEFORE MEALS 12/12/20   Billie Ruddy, MD  potassium chloride SA (KLOR-CON) 20 MEQ tablet Take 1 tablet (20 mEq total) by mouth daily. 11/11/20   Billie Ruddy, MD  QUEtiapine (SEROQUEL) 200 MG tablet Take 1 tablet (200 mg total) by mouth at bedtime. 11/10/20   Meredith Staggers, MD  senna-docusate (SENOKOT-S) 8.6-50 MG tablet Take 1 tablet by mouth 2 (two) times daily. 08/18/18   Love, Ivan Anchors, PA-C  sertraline (ZOLOFT)  100 MG tablet Take by mouth. 08/27/20   [provider]  sodium chloride (OCEAN) 0.65 % SOLN nasal spray Place 1 spray into both nostrils as needed for congestion. 08/18/18   Bary Leriche, PA-C    Physical Exam: Vitals:   12/24/20 1845 12/24/20 1900 12/24/20 1915 12/24/20 1930  BP: (!) 123/58 122/62 (!) 117/50 122/69  Pulse: 80 78 (!) 167 80  Resp: 18 17 (!) 25 17  Temp:      TempSrc:      SpO2: 100% 100% 99% 100%    Constitutional: NAD, calm, comfortable Vitals:   12/24/20 1845 12/24/20 1900 12/24/20 1915 12/24/20 1930  BP: (!) 123/58 122/62 (!) 117/50 122/69  Pulse: 80 78 (!) 167 80  Resp: 18 17 (!) 25 17  Temp:      TempSrc:      SpO2: 100% 100% 99% 100%   General: WDWN, Alert and oriented x3.  Eyes: EOMI, PERRL, conjunctivae pale.  Sclera nonicteric HENT:  [place]/AT, external ears normal.  Nares patent without epistasis.  Mucous membranes are dry. Posterior pharynx clear of any exudate or lesions. Neck: Soft, normal range of motion, supple, no masses, Trachea midline Respiratory: clear to auscultation bilaterally, no wheezing, no crackles. Normal respiratory effort. No accessory muscle use.  Cardiovascular: Regular rate and rhythm, no murmurs / rubs / gallops. +1 lower extremity edema Abdomen: Soft, no tenderness, nondistended, no rebound or guarding. No masses palpated. No HSM.  Bowel sounds normoactive Musculoskeletal: FROM. No cyanosis. No joint deformity upper and lower extremities. Normal muscle tone.  Skin: Warm, dry, intact no rashes, lesions, ulcers. No induration Neurologic: CN 2-12 grossly intact. Normal speech. Sensation intact, Strength 5/5 in upper extremities. Strength 3/5 in legs.  Psychiatric: Normal judgment and insight.  Normal mood.    Labs on Admission: I have personally reviewed following labs and imaging studies  CBC: Recent Labs  Lab 12/24/20 1120 12/24/20 1640  WBC 4.4 5.1  NEUTROABS 2.6 4.0  HGB 3.8 Repeated and verified X2.* 3.7*  HCT  13.6 Repeated and verified X2.* 14.2*  MCV 72.9* 79.3*  PLT 481.0* 460*    Basic Metabolic Panel: Recent Labs  Lab 12/24/20 1120 12/24/20 1640  NA 138 137  K 4.4 4.1  CL 108 109  CO2 23 20*  GLUCOSE 95 90  BUN 33* 29*  CREATININE 1.40 1.40*  CALCIUM 8.5 8.5*  GFR: CrCl cannot be calculated (Unknown ideal weight.).  Liver Function Tests: Recent Labs  Lab 12/24/20 1120  AST 10  ALT 9  ALKPHOS 102  BILITOT 0.3  PROT 6.4  ALBUMIN 3.8    Urine analysis:    Component Value Date/Time   COLORURINE YELLOW 03/19/2020 2349   APPEARANCEUR CLEAR 03/19/2020 2349   LABSPEC 1.010 03/19/2020 2349   PHURINE 5.0 03/19/2020 2349   GLUCOSEU NEGATIVE 03/19/2020 2349   HGBUR NEGATIVE 03/19/2020 2349   BILIRUBINUR NEGATIVE 03/19/2020 2349   BILIRUBINUR n 02/13/2016 1044   [place] 03/19/2020 2349   PROTEINUR NEGATIVE 03/19/2020 2349   UROBILINOGEN 1.0 02/13/2016 1044   UROBILINOGEN 1.0 01/27/2012 0005   NITRITE NEGATIVE 03/19/2020 2349   LEUKOCYTESUR NEGATIVE 03/19/2020 2349    Radiological Exams on Admission: No results found.  EKG: Independently reviewed.  EKG shows normal sinus rhythm with no acute ST elevation or depression.  QTc 457  Assessment/Plan Principal Problem:   Symptomatic anemia [name] is admitted to medical telemetry floor with severe anemia. Has a history of gastritis, esophagitis with bleeding in stomach in past he states that was cauterized at the [facility] hospital 6 to 7 months ago. Given IV protonix twice a day. GI is consulted, Dr. Rush Landmark, who will see patient in the morning. We will keep n.p.o. after midnight in case endoscopy will be required tomorrow Transfuse 2 units of packed red blood cells which been ordered in the emergency room.  We will recheck hemoglobin and hematocrit after transfusion complete.  Further transfusions will be given if indicated.  Active Problems:   HTN (hypertension) Continue home medications of  chlorthalidone and hydralazine.  Monitor blood pressure    AKI (acute kidney injury)  Creatinine elevated to 1.40 from baseline of 0.9-1. Gentle IV fluid hydration with LR at 50 ml/hr overnight Electrolytes and renal function in morning with labs.    Leg Edema Check doppler US of legs to rule out DVT as pt sits in wheelchair for prolonged time every day    History of CVA (cerebrovascular accident) Patient is unable to walk and uses a motorized wheelchair.    Tobacco abuse Nicotine patch provided.  Smoking cessation education to be provided before discharge    DVT prophylaxis: SCD's for DVT prophylaxis. No anticoagulation with suspected GI bleed and severe anemia Code Status:   Full Code  Family Communication:  Diagnosis and plan discussed with patient.  He verbalized understanding agrees with plan.  Further recommendations to follow as clinically indicated Disposition Plan:   Patient is from:  Home  Anticipated DC to:  Home  Anticipated DC date:  Anticipate 2 midnight or more stay  Anticipated DC barriers: No barriers to discharge identified at this time  Consults called:  Gastroenterology, Dr. Rush Landmark consulted by ER physician  Admission status:  Inpatient  Yevonne Aline Markez Dowland MD [facility]  How to contact the [facility] Attending or Consulting provider [place] or covering provider during after hours [place], for this patient?   1. Check the care team in [facility] and look for a) attending/consulting [facility] provider listed and b) the [facility] team listed 2. Log into [email or web address] and use [place]'s universal password to access. If you do not have the password, please contact the hospital operator. 3. Locate the [facility] provider you are looking for under [facility] and page to a number that you can be directly reached. 4. If you still have difficulty reaching the provider, please page the [facility] (Director  on Call) for the Hospitalists listed on amion for assistance.  12/24/2020, 8:09 PM

## 2020-12-24 NOTE — ED Provider Notes (Signed)
[facility] EMERGENCY DEPARTMENT Provider Note   CSN: [id number] Arrival date & time: 12/24/20  1546     History No chief complaint on file.   [name] is a 70 y.o. male.  HPI 70 year old male presents with anemia.  He had labs checked today at his PCPs office for leg swelling and was found to have a hemoglobin of 3.8.  He denies dyspnea, chest pain, fatigue or weakness.  He denies blood in his stool or black stools.  Has had a blood transfusion at the [place], most recently within for 5 months ago for what he states was GI bleeding though is not exactly sure where the bleeding was.  He states he is been having bilateral leg swelling for about 3 days.   Past Medical History:  Diagnosis Date  . Arthritis   . Cameron ulcer, chronic 01/11/2018  . Chronic back pain   . GERD (gastroesophageal reflux disease)    uses Baking Soda  . GIB (gastrointestinal bleeding) 2012  . Glaucoma    right eye  . Headache    occasionally  . Hepatitis C   . Hiatal hernia with gastroesophageal reflux disease and esophagitis 01/05/2018  . History of blood transfusion    no abnormal reaction noted  . History of gout    doesn't take any meds  . Hyperlipidemia    not on any meds  . Hypertension    takes Amlodipine and Lisinopril daily  . Insomnia    takes Ambien nightly  . Ischemic colitis ([place]) 2012  . Joint pain   . Nocturia   . Numbness    both legs occasionally  . PONV (postoperative nausea and vomiting)   . Prostate cancer ([place])   . Shortness of breath dyspnea    rarely but when notices he can be lying/sitting/exertion.Dr.Hochrein is aware per pt  . Stroke ([place]) 2016   . Urinary frequency   . Urinary urgency     Patient Active Problem List   Diagnosis Date Noted  . History of CVA (cerebrovascular accident) 12/24/2020  . Bilateral leg edema 12/24/2020  . AKI (acute kidney injury) ([place]) 03/19/2020  . Reactive depression 10/10/2019  . Acute on chronic anemia   .  Pain   . Agitation 07/26/2018  . Hepatitis C 07/26/2018  . Nontraumatic thalamic hemorrhage ([place]) 07/26/2018  . Osteoarthritis of left hip 04/06/2018  . Cameron ulcer, chronic 01/11/2018  . Symptomatic anemia 01/10/2018  . Syncope 01/10/2018  . Hiatal hernia with gastroesophageal reflux disease and esophagitis 01/05/2018  . Insomnia 01/05/2018  . Rotator cuff syndrome of left shoulder 11/21/2017  . Anemia, iron deficiency   . Gastrointestinal bleeding 10/24/2015  . Tobacco abuse   . Acute ischemic stroke ([place])   . Stroke ([place]) 05/31/2015  . HTN (hypertension) 04/10/2014  . Inguinal hernia without mention of obstruction or gangrene, unilateral or unspecified, (not specified as recurrent)-right 01/30/2014    Past Surgical History:  Procedure Laterality Date  . APPENDECTOMY    . BIOPSY  01/11/2018   Procedure: BIOPSY;  Surgeon: Gatha Mayer, MD;  Location: [facility] ENDOSCOPY;  Service: Endoscopy;;  . COLONOSCOPY    . COLONOSCOPY N/A 10/26/2015   Procedure: COLONOSCOPY;  Surgeon: Doran Stabler, MD;  Location: [facility] ENDOSCOPY;  Service: Endoscopy;  Laterality: N/A;  . ESOPHAGOGASTRODUODENOSCOPY    . ESOPHAGOGASTRODUODENOSCOPY N/A 10/26/2015   Procedure: ESOPHAGOGASTRODUODENOSCOPY (EGD);  Surgeon: Doran Stabler, MD;  Location: [facility] ENDOSCOPY;  Service: Endoscopy;  Laterality: N/A;  .  ESOPHAGOGASTRODUODENOSCOPY (EGD) WITH PROPOFOL N/A 01/11/2018   Procedure: ESOPHAGOGASTRODUODENOSCOPY (EGD) WITH PROPOFOL;  Surgeon: Gatha Mayer, MD;  Location: [facility] ENDOSCOPY;  Service: Endoscopy;  Laterality: N/A;  . INGUINAL HERNIA REPAIR Right 11/04/2014   Procedure: RIGHT INGUINAL HERNIA REPAIR WITH MESH;  Surgeon: Jackolyn Confer, MD;  Location: [facility];  Service: General;  Laterality: Right;  . MASS EXCISION N/A 11/04/2014   Procedure: REMOVAL OF RIGHT GROIN SOFT TISSUE MASS;  Surgeon: Jackolyn Confer, MD;  Location: [facility];  Service: General;  Laterality: N/A;  . Multiple abdominal surgeries     total of  13  . PROSTATECTOMY    . SMALL INTESTINE SURGERY         Family History  Problem Relation Age of Onset  . Alzheimer's disease Father   . Cancer Mother        Lymph node  . Heart failure Brother 45       Transplant 13 years ago  . CAD Brother   . Heart disease Sister 46       MI    Social History   Tobacco Use  . Smoking status: Current Every Day Smoker    Packs/day: 0.50    Years: 50.00    Pack years: 25.00    Types: Cigarettes  . Smokeless tobacco: Never Used  Vaping Use  . Vaping Use: Former  Substance Use Topics  . Alcohol use: Yes    Alcohol/week: 0.0 standard drinks    Comment: occasionally  . Drug use: Yes    Frequency: 5.0 times per week    Types: Marijuana    Comment: "as often as I can get it"    Home Medications Prior to Admission medications   Medication Sig Start Date End Date Taking? Authorizing Provider  acetaminophen (TYLENOL) 325 MG tablet Take 2 tablets (650 mg total) by mouth 4 (four) times daily -  with meals and at bedtime. Patient taking differently: Take 650 mg by mouth every 6 (six) hours as needed for moderate pain or headache. 08/18/18   Love, Ivan Anchors, PA-C  Blood Pressure Monitoring (BLOOD PRESSURE MONITOR/L CUFF) MISC Use as directed daily. 12/28/19   Billie Ruddy, MD  buPROPion (WELLBUTRIN SR) 200 MG 12 hr tablet TAKE 1 TABLET BY MOUTH DAILY 10/27/20   Billie Ruddy, MD  chlorthalidone (HYGROTON) 25 MG tablet Take 1 tablet (25 mg total) by mouth daily. 11/11/20   Billie Ruddy, MD  cyclobenzaprine (FLEXERIL) 5 MG tablet TAKE 1 TABLET BY MOUTH TWICE A DAY AS NEEDED FOR MUSCLE SPASMS 10/27/20   Billie Ruddy, MD  ferrous sulfate 325 (65 FE) MG tablet TAKE 1 TABLET BY MOUTH THREE TWICE A DAY WITH MEALS 11/11/20   Billie Ruddy, MD  hydrALAZINE (APRESOLINE) 25 MG tablet Take 1 tablet (25 mg total) by mouth 3 (three) times daily. 11/11/20   Billie Ruddy, MD  Multiple Vitamin (MULTIVITAMINS PO) Take 1 tablet by mouth daily.  06/04/20   [provider]  pantoprazole (PROTONIX) 40 MG tablet TAKE 1 TABLET BY MOUTH TWICE A DAY BEFORE MEALS 12/12/20   Billie Ruddy, MD  potassium chloride SA (KLOR-CON) 20 MEQ tablet Take 1 tablet (20 mEq total) by mouth daily. 11/11/20   Billie Ruddy, MD  QUEtiapine (SEROQUEL) 200 MG tablet Take 1 tablet (200 mg total) by mouth at bedtime. 11/10/20   Meredith Staggers, MD  senna-docusate (SENOKOT-S) 8.6-50 MG tablet Take 1 tablet by mouth 2 (two) times daily.  08/18/18   Love, Ivan Anchors, PA-C  sertraline (ZOLOFT) 100 MG tablet Take by mouth. 08/27/20   [provider]  sodium chloride (OCEAN) 0.65 % SOLN nasal spray Place 1 spray into both nostrils as needed for congestion. 08/18/18   Love, Ivan Anchors, PA-C    Allergies    Penicillins and Sudafed [pseudoephedrine]  Review of Systems   Review of Systems  Constitutional: Negative for fatigue and fever.  Respiratory: Negative for cough and shortness of breath.   Cardiovascular: Positive for leg swelling. Negative for chest pain.  Gastrointestinal: Negative for abdominal pain and blood in stool.  Neurological: Negative for weakness.  All other systems reviewed and are negative.   Physical Exam Updated Vital Signs BP (!) 141/67 (BP Location: Left Arm)   Pulse 81   Temp 97.7 F (36.5 C) (Oral)   Resp 20   SpO2 100%   Physical Exam Vitals and nursing note reviewed.  Constitutional:      Appearance: He is well-developed.  HENT:     Head: Normocephalic and atraumatic.     Right Ear: External ear normal.     Left Ear: External ear normal.     Nose: Nose normal.  Eyes:     General:        Right eye: No discharge.        Left eye: No discharge.     Comments: Pale conjunctiva  Cardiovascular:     Rate and Rhythm: Normal rate and regular rhythm.     Heart sounds: Normal heart sounds.  Pulmonary:     Effort: Pulmonary effort is normal.     Breath sounds: Normal breath sounds.  Abdominal:     General: There  is no distension.     Palpations: Abdomen is soft.     Tenderness: There is no abdominal tenderness.  Genitourinary:    Comments: Brown stool on rectal exam with gross blood.  No melena. Musculoskeletal:     Cervical back: Neck supple.     Right lower leg: Edema present.     Left lower leg: Edema present.     Comments: Bilateral ankle/lower leg edema  Skin:    General: Skin is warm and dry.  Neurological:     Mental Status: He is alert.  Psychiatric:        Mood and Affect: Mood is not anxious.     ED Results / Procedures / Treatments   Labs (all labs ordered are listed, but only abnormal results are displayed) Labs Reviewed  CBC WITH DIFFERENTIAL/PLATELET - Abnormal; Notable for the following components:      Result Value   RBC 1.79 (*)    Hemoglobin 3.7 (*)    HCT 14.2 (*)    MCV 79.3 (*)    MCH 20.7 (*)    MCHC 26.1 (*)    RDW 25.1 (*)    Platelets 460 (*)    nRBC 1.0 (*)    All other components within normal limits  BASIC METABOLIC PANEL - Abnormal; Notable for the following components:   CO2 20 (*)    BUN 29 (*)    Creatinine, Ser 1.40 (*)    Calcium 8.5 (*)    GFR, Estimated 54 (*)    All other components within normal limits  HEPATIC FUNCTION PANEL - Abnormal; Notable for the following components:   Total Protein 6.0 (*)    Albumin 3.1 (*)    AST 14 (*)    All other components within  normal limits  IRON AND TIBC - Abnormal; Notable for the following components:   Iron 13 (*)    Saturation Ratios 3 (*)    All other components within normal limits  FERRITIN - Abnormal; Notable for the following components:   Ferritin 3 (*)    All other components within normal limits  RETICULOCYTES - Abnormal; Notable for the following components:   Retic Ct Pct 8.6 (*)    RBC. 1.84 (*)    Immature Retic Fract 32.3 (*)    All other components within normal limits  RESP PANEL BY RT-PCR (FLU A&B, COVID) ARPGX2  VITAMIN B12  FOLATE  BASIC METABOLIC PANEL  CBC   HEMOGLOBIN AND HEMATOCRIT, BLOOD  POC OCCULT BLOOD, ED  TYPE AND SCREEN  PREPARE RBC (CROSSMATCH)  TROPONIN I (HIGH SENSITIVITY)  TROPONIN I (HIGH SENSITIVITY)    EKG EKG Interpretation  Date/Time:  Wednesday December 24 2020 16:34:55 EDT Ventricular Rate:  87 PR Interval:  204 QRS Duration: 90 QT Interval:  380 QTC Calculation: 457 R Axis:   10 Text Interpretation: Normal sinus rhythm Normal ECG rate a little faster, otherwise unchanged since 2021 Confirmed by Sherwood Gambler [phone]) on 12/24/2020 4:51:54 PM   Radiology No results found.  Procedures .Critical Care Performed by: Sherwood Gambler, MD Authorized by: Sherwood Gambler, MD   Critical care provider statement:    Critical care time (minutes):  35   Critical care was necessary to treat or prevent imminent or life-threatening deterioration of the following conditions:  Circulatory failure   Critical care was time spent personally by me on the following activities:  Discussions with consultants, evaluation of patient's response to treatment, examination of patient, ordering and performing treatments and interventions, ordering and review of laboratory studies, ordering and review of radiographic studies, pulse oximetry, re-evaluation of patient's condition, obtaining history from patient or surrogate and review of old charts     Medications Ordered in ED Medications  chlorthalidone (HYGROTON) tablet 25 mg (has no administration in time range)  hydrALAZINE (APRESOLINE) tablet 25 mg (25 mg Oral Given 12/24/20 2115)  buPROPion (WELLBUTRIN SR) 12 hr tablet 200 mg (has no administration in time range)  ferrous sulfate tablet 325 mg (has no administration in time range)  lactated ringers infusion (has no administration in time range)  acetaminophen (TYLENOL) tablet 650 mg (has no administration in time range)    Or  acetaminophen (TYLENOL) suppository 650 mg (has no administration in time range)  senna-docusate (Senokot-S)  tablet 1 tablet (has no administration in time range)  nicotine (NICODERM CQ - dosed in mg/24 hr) patch 7 mg (7 mg Transdermal Not Given 12/24/20 2108)  pantoprazole (PROTONIX) injection 40 mg (40 mg Intravenous Given 12/24/20 2114)  pantoprazole (PROTONIX) injection 40 mg (40 mg Intravenous Given 12/24/20 1808)  0.9 %  sodium chloride infusion (10 mL/hr Intravenous New Bag/Given 12/24/20 2125)    ED Course  I have reviewed the triage vital signs and the nursing notes.  Pertinent labs & imaging results that were available during my care of the patient were reviewed by me and considered in my medical decision making (see chart for details).    MDM Rules/Calculators/A&P                          Patient's hemoglobin is quite low at 3.8.  However he is quite well-appearing and his vitals are stable.  He will be given 2 units of blood and will need  reassessment.  He has a longstanding history of some GI issues including esophageal/stomach bleeding and so he will be given Protonix.  Discussed with gastroenterology as well, Dr. Rush Landmark, who will consult. Hospitalist to admit.  Final Clinical Impression(s) / ED Diagnoses Final diagnoses:  Anemia, unspecified type    Rx / DC Orders ED Discharge Orders    None       Sherwood Gambler, MD 12/24/20 2257

## 2020-12-24 NOTE — Patient Instructions (Addendum)
c Peripheral Edema  Peripheral edema is swelling that is caused by a buildup of fluid. Peripheral edema most often affects the lower legs, ankles, and feet. It can also develop in the arms, hands, and face. The area of the body that has peripheral edema will look swollen. It may also feel heavy or warm. Your clothes may start to feel tight. Pressing on the area may make a temporary dent in your skin. You may not be able to move your swollen arm or leg as much as usual. There are many causes of peripheral edema. It can happen because of a complication of other conditions such as congestive heart failure, kidney disease, or a problem with your blood circulation. It also can be a side effect of certain medicines or because of an infection. It often happens to women during pregnancy. Sometimes, the cause is not known. Follow these instructions at home: Managing pain, stiffness, and swelling  Raise (elevate) your legs while you are sitting or lying down.  Move around often to prevent stiffness and to lessen swelling.  Do not sit or stand for long periods of time.  Wear support stockings as told by your health care provider.   Medicines  Take over-the-counter and prescription medicines only as told by your health care provider.  Your health care provider may prescribe medicine to help your body get rid of excess water (diuretic). General instructions  Pay attention to any changes in your symptoms.  Follow instructions from your health care provider about limiting salt (sodium) in your diet. Sometimes, eating less salt may reduce swelling.  Moisturize skin daily to help prevent skin from cracking and draining.  Keep all follow-up visits as told by your health care provider. This is important. Contact a health care provider if you have:  A fever.  Edema that starts suddenly or is getting worse, especially if you are pregnant or have a medical condition.  Swelling in only one  leg.  Increased swelling, redness, or pain in one or both of your legs.  Drainage or sores at the area where you have edema. Get help right away if you:  Develop shortness of breath, especially when you are lying down.  Have pain in your chest or abdomen.  Feel weak.  Feel faint. Summary  Peripheral edema is swelling that is caused by a buildup of fluid. Peripheral edema most often affects the lower legs, ankles, and feet.  Move around often to prevent stiffness and to lessen swelling. Do not sit or stand for long periods of time.  Pay attention to any changes in your symptoms.  Contact a health care provider if you have edema that starts suddenly or is getting worse, especially if you are pregnant or have a medical condition.  Get help right away if you develop shortness of breath, especially when lying down. This information is not intended to replace advice given to you by your health care provider. Make sure you discuss any questions you have with your health care provider. Document Revised: 03/29/2018 Document Reviewed: 03/29/2018 Elsevier Patient Education  2021 [employer].

## 2020-12-24 NOTE — Progress Notes (Signed)
Subjective:    Patient ID: [name], male    DOB: Feb 04, 1951, 70 y.o.   MRN: [id number]  Chief Complaint  Patient presents with  . Leg Swelling    HPI Patient was seen today for acute concern.  Pt endorses LE edema since Sunday after returning from church.  Pt denies eating anything salty that day.  States his appetite has continued to be poor 2/2 his loss of taste and smell.  Patient also notes the ENT office he was referred to will not take Medicare.  Patient notes eating a pizza on Monday  Has compression socks which have not helped.  Patient having difficulty getting his shoes on 2/2 pedal edema.  Denies shortness of breath, CP, calf pain.  Past Medical History:  Diagnosis Date  . Arthritis   . Cameron ulcer, chronic 01/11/2018  . Chronic back pain   . GERD (gastroesophageal reflux disease)    uses Baking Soda  . GIB (gastrointestinal bleeding) 2012  . Glaucoma    right eye  . Headache    occasionally  . Hepatitis C   . Hiatal hernia with gastroesophageal reflux disease and esophagitis 01/05/2018  . History of blood transfusion    no abnormal reaction noted  . History of gout    doesn't take any meds  . Hyperlipidemia    not on any meds  . Hypertension    takes Amlodipine and Lisinopril daily  . Insomnia    takes Ambien nightly  . Ischemic colitis ([place]) 2012  . Joint pain   . Nocturia   . Numbness    both legs occasionally  . PONV (postoperative nausea and vomiting)   . Prostate cancer ([place])   . Shortness of breath dyspnea    rarely but when notices he can be lying/sitting/exertion.Dr.Hochrein is aware per pt  . Stroke ([place]) 2016   . Urinary frequency   . Urinary urgency     Allergies  Allergen Reactions  . Penicillins Anaphylaxis    Has patient had a PCN reaction causing immediate rash, facial/tongue/throat swelling, SOB or lightheadedness with hypotension: Yes Has patient had a PCN reaction causing severe rash involving mucus membranes or skin  necrosis: No Has patient had a PCN reaction that required hospitalization: No Has patient had a PCN reaction occurring within the last 10 years: No If all of the above answers are "NO", then may proceed with Cephalosporin use..  . Sudafed [Pseudoephedrine] Other (See Comments)    Heart "races"    ROS General: Denies fever, chills, night sweats, changes in weight, changes in appetite HEENT: Denies headaches, ear pain, changes in vision, rhinorrhea, sore throat CV: Denies CP, palpitations, SOB, orthopnea   +LE edema Pulm: Denies SOB, cough, wheezing GI: Denies abdominal pain, nausea, vomiting, diarrhea, constipation GU: Denies dysuria, hematuria  + increased urination Msk: Denies muscle cramps, joint pains Neuro: Denies weakness, numbness, tingling Skin: Denies rashes, bruising Psych: Denies depression, anxiety, hallucinations    Objective:    Blood pressure 120/60, pulse 77, temperature 97.8 F (36.6 C), temperature source Oral, SpO2 96 %.  Gen. Pleasant, well-nourished, in no distress, normal affect   HEENT: [place]/AT, face symmetric, conjunctiva clear, no scleral icterus, PERRLA, EOMI, nares patent without drainage Lungs: no accessory muscle use, CTAB, no wheezes or rales Cardiovascular: RRR, no m/r/g, 1-2 +pedal edema in b/l feet R>L. Trace edema inferior to knee b/l.  Musculoskeletal: No deformities, no cyanosis or clubbing, normal tone Neuro:  A&Ox3, CN II-XII intact, sitting in motorized  wheelchair Skin:  Warm, no lesions/ rash   Wt Readings from Last 3 Encounters:  07/30/20 173 lb (78.5 kg)  03/31/20 179 lb (81.2 kg)  03/20/20 187 lb 2.7 oz (84.9 kg)    Lab Results  Component Value Date   WBC 6.3 09/22/2020   HGB 10.3 (A) 09/22/2020   HCT 33 (A) 09/22/2020   PLT 331 09/22/2020   GLUCOSE 80 07/30/2020   CHOL 102 07/22/2018   TRIG 108 07/22/2018   HDL 35 (L) 07/22/2018   LDLCALC 45 07/22/2018   ALT 19 09/22/2020   AST 14 09/22/2020   NA 139 09/22/2020   K 4.3  09/22/2020   CL 109 (A) 09/22/2020   CREATININE 1.3 09/22/2020   BUN 15 07/30/2020   CO2 23 (A) 09/22/2020   TSH 0.86 07/30/2020   INR 1.23 07/20/2018   HGBA1C 4.7 (L) 07/22/2018    Assessment/Plan:  Bilateral lower extremity edema  -Discussed possible causes including dependent edema, CHF, increased sodium intake, decreased protein 2/2 lack of appetite -Discussed obtaining labs to assess kidney function and BMP -Supportive care including TED hose, compression socks, elevation, limiting sodium intake -Send in Lasix if appropriate based on labs. -Given handout -Given precautions - Plan: Brain Natriuretic Peptide, CBC with Differential/Platelet, CMP, T4, Free, TSH  Essential hypertension -Controlled -Continue current medications including chlorthalidone 25 mg daily, K. Dur 20 mEq daily hydralazine 25 mg 3 times daily -Continue to monitor  Will have referral for ENT recent to an office that accepts patient's insurance.  We will also check on pt restarting outpatient rehab now that his BP is controlled.  F/u prn  [name], MD

## 2020-12-25 ENCOUNTER — Inpatient Hospital Stay (HOSPITAL_COMMUNITY): Payer: Medicare HMO

## 2020-12-25 ENCOUNTER — Encounter (HOSPITAL_COMMUNITY): Payer: Self-pay | Admitting: Family Medicine

## 2020-12-25 DIAGNOSIS — R195 Other fecal abnormalities: Secondary | ICD-10-CM

## 2020-12-25 DIAGNOSIS — D500 Iron deficiency anemia secondary to blood loss (chronic): Secondary | ICD-10-CM

## 2020-12-25 DIAGNOSIS — R609 Edema, unspecified: Secondary | ICD-10-CM

## 2020-12-25 LAB — CBC
HCT: 19.4 % — ABNORMAL LOW (ref 39.0–52.0)
Hemoglobin: 5.8 g/dL — CL (ref 13.0–17.0)
MCH: 24.2 pg — ABNORMAL LOW (ref 26.0–34.0)
MCHC: 29.9 g/dL — ABNORMAL LOW (ref 30.0–36.0)
MCV: 80.8 fL (ref 80.0–100.0)
Platelets: 371 10*3/uL (ref 150–400)
RBC: 2.4 MIL/uL — ABNORMAL LOW (ref 4.22–5.81)
RDW: 22.2 % — ABNORMAL HIGH (ref 11.5–15.5)
WBC: 6.3 10*3/uL (ref 4.0–10.5)
nRBC: 0.5 % — ABNORMAL HIGH (ref 0.0–0.2)

## 2020-12-25 LAB — BASIC METABOLIC PANEL
Anion gap: 7 (ref 5–15)
BUN: 24 mg/dL — ABNORMAL HIGH (ref 8–23)
CO2: 21 mmol/L — ABNORMAL LOW (ref 22–32)
Calcium: 8.5 mg/dL — ABNORMAL LOW (ref 8.9–10.3)
Chloride: 109 mmol/L (ref 98–111)
Creatinine, Ser: 1.24 mg/dL (ref 0.61–1.24)
GFR, Estimated: >60 mL/min (ref >60)
Glucose, Bld: 83 mg/dL (ref 70–99)
Potassium: 4 mmol/L (ref 3.5–5.1)
Sodium: 137 mmol/L (ref 135–145)

## 2020-12-25 LAB — RESP PANEL BY RT-PCR (FLU A&B, COVID) ARPGX2
Influenza A by PCR: NEGATIVE
Influenza B by PCR: NEGATIVE
SARS Coronavirus 2 by RT PCR: NEGATIVE

## 2020-12-25 LAB — PROTIME-INR
INR: 1.4 — ABNORMAL HIGH (ref 0.8–1.2)
Prothrombin Time: 17.3 seconds — ABNORMAL HIGH (ref 11.4–15.2)

## 2020-12-25 LAB — PREPARE RBC (CROSSMATCH)

## 2020-12-25 LAB — HEMOGLOBIN AND HEMATOCRIT, BLOOD
HCT: 25.8 % — ABNORMAL LOW (ref 39.0–52.0)
Hemoglobin: 8 g/dL — ABNORMAL LOW (ref 13.0–17.0)

## 2020-12-25 MED ORDER — SODIUM CHLORIDE 0.9 % IV SOLN
510.0000 mg | Freq: Once | INTRAVENOUS | Status: AC
  Administered 2020-12-25: 510 mg via INTRAVENOUS
  Filled 2020-12-25: qty 17

## 2020-12-25 MED ORDER — SODIUM CHLORIDE 0.9% IV SOLUTION: Freq: Once | INTRAVENOUS | Status: AC

## 2020-12-25 MED ORDER — POLYETHYLENE GLYCOL 3350 17 G PO PACK: 17.0000 g | PACK | Freq: Every day | ORAL | Status: DC | PRN

## 2020-12-25 MED ORDER — DM-GUAIFENESIN ER 30-600 MG PO TB12: 1.0000 | ORAL_TABLET | Freq: Two times a day (BID) | ORAL | Status: DC | PRN

## 2020-12-25 MED ORDER — QUETIAPINE FUMARATE 100 MG PO TABS
200.0000 mg | ORAL_TABLET | Freq: Every day | ORAL | Status: DC
  Administered 2020-12-25 – 2020-12-26 (×3): 200 mg via ORAL
  Filled 2020-12-25 (×3): qty 2
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
## 2020-12-25 NOTE — Progress Notes (Signed)
Bilateral lower extremity venous duplex completed. Refer to "CV Proc" under chart review to view preliminary results.  12/25/2020 4:13 PM Kelby Aline., MHA, RVT, RDCS, RDMS
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
## 2020-12-25 NOTE — Progress Notes (Addendum)
Date and time results received: 12/25/20 @ 6:55am by lab [place].   Test: Hgb (hemoglobin) Critical Value: 5.8  Name of Provider Notified: A. Amin 12/25/2020 @ 7:00am   Orders Received? None yet Or Actions Taken?: MD going to review.

## 2020-12-25 NOTE — Plan of Care (Signed)
  Problem: Education: Goal: Ability to identify signs and symptoms of gastrointestinal bleeding will improve Outcome: Progressing   Problem: Bowel/Gastric: Goal: Will show no signs and symptoms of gastrointestinal bleeding Outcome: Progressing   Problem: Fluid Volume: Goal: Will show no signs and symptoms of excessive bleeding Outcome: Progressing   Problem: Clinical Measurements: Goal: Complications related to the disease process, condition or treatment will be avoided or minimized Outcome: Progressing   Problem: Education: Goal: Knowledge of General Education information will improve Description: Including pain rating scale, medication(s)/side effects and non-pharmacologic comfort measures Outcome: Progressing   Problem: Health Behavior/Discharge Planning: Goal: Ability to manage health-related needs will improve Outcome: Progressing   Problem: Clinical Measurements: Goal: Ability to maintain clinical measurements within normal limits will improve Outcome: Progressing Goal: Will remain free from infection Outcome: Progressing Goal: Diagnostic test results will improve Outcome: Progressing Goal: Respiratory complications will improve Outcome: Progressing Goal: Cardiovascular complication will be avoided Outcome: Progressing   Problem: Nutrition: Goal: Adequate nutrition will be maintained Outcome: Progressing   Problem: Coping: Goal: Level of anxiety will decrease Outcome: Progressing   Problem: Elimination: Goal: Will not experience complications related to bowel motility Outcome: Progressing Goal: Will not experience complications related to urinary retention Outcome: Progressing   Problem: Pain Managment: Goal: General experience of comfort will improve Outcome: Progressing   Problem: Safety: Goal: Ability to remain free from injury will improve Outcome: Progressing   Problem: Skin Integrity: Goal: Risk for impaired skin integrity will  decrease Outcome: Progressing

## 2020-12-25 NOTE — Progress Notes (Signed)
PROGRESS NOTE    [name]  MWN:[id number] DOB: 08-17-1950 DOA: 12/24/2020 PCP: Billie Ruddy, MD   Brief Narrative:  70 year old with history of CVA, gastritis/esophagitis, anemia requiring multiple transfusion, HTN was sent to the hospital by PCP after finding to have hemoglobin of 3.8.  Patient had been reporting of symptomatic anemia.   Assessment & Plan:   Principal Problem:   Symptomatic anemia Active Problems:   HTN (hypertension)   Tobacco abuse   AKI (acute kidney injury) ([place])   History of CVA (cerebrovascular accident)   Bilateral leg edema   Symptomatic anemia - Has previous history of gastritis and esophagitis - PPI IV twice daily - GI consulted-endoscopy plans tomorrow - PRBC transfusion to keep hemoglobin greater than 7.0 - No obvious source  Acute kidney injury - Admission creatinine 1.4, baseline 0.9.  IV fluids  Severe iron deficiency - Continue on iron supplements.  1 dose IV iron today - Bowel regimen   Bilateral lower extremity edema, minimal - Lower extremity Dopplers.  We will check echocardiogram  Essential hypertension - Hold off on any home medications  History of CVA - Not on any home medications?  Tobacco use -Nicotine patch as needed   DVT prophylaxis: SCDs Start: 12/24/20 2036 Code Status: Full code Family Communication:  Called his brother, Olga Millers, at his request.   Status is: Inpatient   Dispo: The patient is from: Home              Anticipated d/c is to: Home              Patient currently is not medically stable to d/c.  Severe anemia requiring GI evaluation.  Unsafe for discharge.   Difficult to place patient No   Subjective: Feels okay no complaints at this time.  Denies any melanotic stool.  Review of Systems Otherwise negative except as per HPI, including: General: Denies fever, chills, night sweats or unintended weight loss. Resp: Denies cough, wheezing, shortness of breath. Cardiac: Denies chest  pain, palpitations, orthopnea, paroxysmal nocturnal dyspnea. GI: Denies abdominal pain, nausea, vomiting, diarrhea or constipation GU: Denies dysuria, frequency, hesitancy or incontinence MS: Denies muscle aches, joint pain or swelling Neuro: Denies headache, neurologic deficits (focal weakness, numbness, tingling), abnormal gait Psych: Denies anxiety, depression, SI/HI/AVH Skin: Denies new rashes or lesions ID: Denies sick contacts, exotic exposures, travel  Examination:  General exam: Appears calm and comfortable  Respiratory system: Clear to auscultation. Respiratory effort normal. Cardiovascular system: S1 & S2 heard, RRR. No JVD, murmurs, rubs, gallops or clicks. No pedal edema. Gastrointestinal system: Abdomen is nondistended, soft and nontender. No organomegaly or masses felt. Normal bowel sounds heard. Central nervous system: Alert and oriented. No focal neurological deficits. Extremities: Symmetric 5 x 5 power. Skin: No rashes, lesions or ulcers Psychiatry: Judgement and insight appear normal. Mood & affect appropriate.     Objective: Vitals:   12/25/20 0602 12/25/20 0741 12/25/20 0746 12/25/20 0802  BP:  (!) 122/52 (!) 126/57 (!) 127/56  Pulse:  75 73 70  Resp:  15 15 15   Temp:  98 F (36.7 C) 98 F (36.7 C) 97.7 F (36.5 C)  TempSrc:  Oral Oral Oral  SpO2:  100% 100% 100%  Weight: 78.5 kg     Height: 5' 9.02" (1.753 m)       Intake/Output Summary (Last 24 hours) at 12/25/2020 0848 Last data filed at 12/25/2020 0746 Gross per 24 hour  Intake 1388.59 ml  Output 500 ml  Net  888.59 ml   Filed Weights   12/25/20 0602  Weight: 78.5 kg     Data Reviewed:   CBC: Recent Labs  Lab 12/24/20 1120 12/24/20 1640 12/25/20 0621  WBC 4.4 5.1 6.3  NEUTROABS 2.6 4.0  --   HGB 3.8 Repeated and verified X2.* 3.7* 5.8*  HCT 13.6 Repeated and verified X2.* 14.2* 19.4*  MCV 72.9* 79.3* 80.8  PLT 481.0* 460* 762   Basic Metabolic Panel: Recent Labs  Lab  12/24/20 1120 12/24/20 1640 12/25/20 0621  NA 138 137 137  K 4.4 4.1 4.0  CL 108 109 109  CO2 23 20* 21*  GLUCOSE 95 90 83  BUN 33* 29* 24*  CREATININE 1.40 1.40* 1.24  CALCIUM 8.5 8.5* 8.5*   GFR: Estimated Creatinine Clearance: 54.6 mL/min (by C-G formula based on SCr of 1.24 mg/dL). Liver Function Tests: Recent Labs  Lab 12/24/20 1120 12/24/20 1907  AST 10 14*  ALT 9 11  ALKPHOS 102 90  BILITOT 0.3 0.3  PROT 6.4 6.0*  ALBUMIN 3.8 3.1*   No results for input(s): LIPASE, AMYLASE in the last 168 hours. No results for input(s): AMMONIA in the last 168 hours. Coagulation Profile: No results for input(s): INR, PROTIME in the last 168 hours. Cardiac Enzymes: No results for input(s): CKTOTAL, CKMB, CKMBINDEX, TROPONINI in the last 168 hours. BNP (last 3 results) Recent Labs    12/24/20 1120  PROBNP 65.0   HbA1C: No results for input(s): HGBA1C in the last 72 hours. CBG: No results for input(s): GLUCAP in the last 168 hours. Lipid Profile: No results for input(s): CHOL, HDL, LDLCALC, TRIG, CHOLHDL, LDLDIRECT in the last 72 hours. Thyroid Function Tests: Recent Labs    12/24/20 1120  TSH 1.01  FREET4 0.64   Anemia Panel: Recent Labs    12/24/20 1907 12/24/20 1910  VITAMINB12 534  --   FOLATE  --  20.0  FERRITIN 3*  --   TIBC 392  --   IRON 13*  --   RETICCTPCT  --  8.6*   Sepsis Labs: No results for input(s): PROCALCITON, LATICACIDVEN in the last 168 hours.  Recent Results (from the past 240 hour(s))  Resp Panel by RT-PCR (Flu A&B, Covid) Nasopharyngeal Swab     Status: None   Collection Time: 12/24/20  6:11 PM   Specimen: Nasopharyngeal Swab; Nasopharyngeal(NP) swabs in vial transport medium  Result Value Ref Range Status   SARS Coronavirus 2 by RT PCR NEGATIVE NEGATIVE Final    Comment: (NOTE) SARS-CoV-2 target nucleic acids are NOT DETECTED.  The SARS-CoV-2 RNA is generally detectable in upper respiratory specimens during the acute phase of  infection. The lowest concentration of SARS-CoV-2 viral copies this assay can detect is 138 copies/mL. A negative result does not preclude SARS-Cov-2 infection and should not be used as the sole basis for treatment or other patient management decisions. A negative result may occur with  improper specimen collection/handling, submission of specimen other than nasopharyngeal swab, presence of viral mutation(s) within the areas targeted by this assay, and inadequate number of viral copies(<138 copies/mL). A negative result must be combined with clinical observations, patient history, and epidemiological information. The expected result is Negative.  Fact Sheet for Patients:  EntrepreneurPulse.com.au  Fact Sheet for Healthcare Providers:  IncredibleEmployment.be  This test is no t yet approved or cleared by the [place] FDA and  has been authorized for detection and/or diagnosis of SARS-CoV-2 by FDA under an Emergency Use Authorization (EUA). This EUA  will remain  in effect (meaning this test can be used) for the duration of the COVID-19 declaration under Section 564(b)(1) of the Act, 21 U.S.C.section 360bbb-3(b)(1), unless the authorization is terminated  or revoked sooner.       Influenza A by PCR NEGATIVE NEGATIVE Final   Influenza B by PCR NEGATIVE NEGATIVE Final    Comment: (NOTE) The Xpert Xpress SARS-CoV-2/FLU/RSV plus assay is intended as an aid in the diagnosis of influenza from Nasopharyngeal swab specimens and should not be used as a sole basis for treatment. Nasal washings and aspirates are unacceptable for Xpert Xpress SARS-CoV-2/FLU/RSV testing.  Fact Sheet for Patients: EntrepreneurPulse.com.au  Fact Sheet for Healthcare Providers: IncredibleEmployment.be  This test is not yet approved or cleared by the [place] FDA and has been authorized for detection and/or diagnosis of SARS-CoV-2  by FDA under an Emergency Use Authorization (EUA). This EUA will remain in effect (meaning this test can be used) for the duration of the COVID-19 declaration under Section 564(b)(1) of the Act, 21 U.S.C. section 360bbb-3(b)(1), unless the authorization is terminated or revoked.  Performed at [facility], [place] [address] [place], [place] [address]          Radiology Studies: No results found.      Scheduled Meds:  sodium chloride   Intravenous Once   buPROPion  200 mg Oral Daily   chlorthalidone  25 mg Oral Daily   ferrous sulfate  325 mg Oral TID WC   hydrALAZINE  25 mg Oral TID   nicotine  7 mg Transdermal Daily   pantoprazole (PROTONIX) IV  40 mg Intravenous Q12H   QUEtiapine  200 mg Oral QHS   Continuous Infusions:  lactated ringers 50 mL/hr at 12/25/20 0500     LOS: 1 day   Time spent= 35 mins    Verdis Koval Arsenio Loader, MD [facility]  If 7PM-7AM, please contact night-coverage  12/25/2020, 8:48 AM

## 2020-12-26 ENCOUNTER — Inpatient Hospital Stay (HOSPITAL_COMMUNITY): Payer: Medicare HMO | Admitting: Anesthesiology

## 2020-12-26 ENCOUNTER — Encounter (HOSPITAL_COMMUNITY): Payer: Self-pay | Admitting: Family Medicine

## 2020-12-26 ENCOUNTER — Inpatient Hospital Stay (HOSPITAL_COMMUNITY): Payer: Medicare HMO

## 2020-12-26 ENCOUNTER — Encounter (HOSPITAL_COMMUNITY)
Admission: EM | Disposition: A | Discharge status: Home / Self Care | Payer: Self-pay | Source: Home / Self Care | Attending: Internal Medicine

## 2020-12-26 DIAGNOSIS — R195 Other fecal abnormalities: Secondary | ICD-10-CM

## 2020-12-26 DIAGNOSIS — K5520 Angiodysplasia of colon without hemorrhage: Secondary | ICD-10-CM

## 2020-12-26 DIAGNOSIS — I428 Other cardiomyopathies: Secondary | ICD-10-CM

## 2020-12-26 HISTORY — PX: ESOPHAGOGASTRODUODENOSCOPY (EGD) WITH PROPOFOL: SHX5813

## 2020-12-26 HISTORY — PX: HOT HEMOSTASIS: SHX5433

## 2020-12-26 LAB — TYPE AND SCREEN
ABO/RH(D): O POS
Antibody Screen: NEGATIVE
Unit division: 0
Unit division: 0
Unit division: 0
Unit division: 0

## 2020-12-26 LAB — BASIC METABOLIC PANEL
Anion gap: 9 (ref 5–15)
BUN: 19 mg/dL (ref 8–23)
CO2: 20 mmol/L — ABNORMAL LOW (ref 22–32)
Calcium: 8.5 mg/dL — ABNORMAL LOW (ref 8.9–10.3)
Chloride: 110 mmol/L (ref 98–111)
Creatinine, Ser: 1.31 mg/dL — ABNORMAL HIGH (ref 0.61–1.24)
GFR, Estimated: 58 mL/min — ABNORMAL LOW (ref >60)
Glucose, Bld: 97 mg/dL (ref 70–99)
Potassium: 4 mmol/L (ref 3.5–5.1)
Sodium: 139 mmol/L (ref 135–145)

## 2020-12-26 LAB — CBC
HCT: 26.5 % — ABNORMAL LOW (ref 39.0–52.0)
Hemoglobin: 8.1 g/dL — ABNORMAL LOW (ref 13.0–17.0)
MCH: 25.2 pg — ABNORMAL LOW (ref 26.0–34.0)
MCHC: 30.6 g/dL (ref 30.0–36.0)
MCV: 82.3 fL (ref 80.0–100.0)
Platelets: 362 10*3/uL (ref 150–400)
RBC: 3.22 MIL/uL — ABNORMAL LOW (ref 4.22–5.81)
RDW: 20.6 % — ABNORMAL HIGH (ref 11.5–15.5)
WBC: 7.6 10*3/uL (ref 4.0–10.5)
nRBC: 0.7 % — ABNORMAL HIGH (ref 0.0–0.2)

## 2020-12-26 LAB — BPAM RBC
Blood Product Expiration Date: 202207092359
Blood Product Expiration Date: 202207092359
Blood Product Expiration Date: 202207102359
Blood Product Expiration Date: 202207102359
ISSUE DATE / TIME: 202206082108
ISSUE DATE / TIME: 202206090024
ISSUE DATE / TIME: 202206090738
ISSUE DATE / TIME: 202206091022
Unit Type and Rh: 5100
Unit Type and Rh: 5100
Unit Type and Rh: 5100
Unit Type and Rh: 5100

## 2020-12-26 LAB — ECHOCARDIOGRAM COMPLETE
Area-P 1/2: 3.45 cm2
Height: 69.016 in
P 1/2 time: 343 msec
S' Lateral: 2.8 cm
Weight: 2768.98 oz

## 2020-12-26 LAB — MAGNESIUM: Magnesium: 2 mg/dL (ref 1.7–2.4)

## 2020-12-26 LAB — OCCULT BLOOD X 1 CARD TO LAB, STOOL: Fecal Occult Bld: POSITIVE — AB

## 2020-12-26 SURGERY — ESOPHAGOGASTRODUODENOSCOPY (EGD) WITH PROPOFOL
Anesthesia: Monitor Anesthesia Care

## 2020-12-26 MED ORDER — PANTOPRAZOLE SODIUM 40 MG PO TBEC
40.0000 mg | DELAYED_RELEASE_TABLET | Freq: Every day | ORAL | Status: DC
  Administered 2020-12-26 – 2020-12-27 (×2): 40 mg via ORAL
  Filled 2020-12-26 (×2): qty 1

## 2020-12-26 MED ORDER — GLUCAGON HCL RDNA (DIAGNOSTIC) 1 MG IJ SOLR
INTRAMUSCULAR | Status: DC | PRN
  Administered 2020-12-26: .25 mg via INTRAVENOUS

## 2020-12-26 MED ORDER — GLUCAGON HCL RDNA (DIAGNOSTIC) 1 MG IJ SOLR
INTRAMUSCULAR | Status: AC
  Filled 2020-12-26: qty 1

## 2020-12-26 MED ORDER — PROPOFOL 10 MG/ML IV BOLUS
INTRAVENOUS | Status: DC | PRN
  Administered 2020-12-26 (×4): 20 mg via INTRAVENOUS

## 2020-12-26 MED ORDER — LIDOCAINE 2% (20 MG/ML) 5 ML SYRINGE
INTRAMUSCULAR | Status: DC | PRN
  Administered 2020-12-26: 40 mg via INTRAVENOUS

## 2020-12-26 MED ORDER — PROPOFOL 500 MG/50ML IV EMUL
INTRAVENOUS | Status: DC | PRN
  Administered 2020-12-26: 125 ug/kg/min via INTRAVENOUS

## 2020-12-26 SURGICAL SUPPLY — 15 items

## 2020-12-26 NOTE — Op Note (Signed)
[facility] Patient Name: [name] Procedure Date : 12/26/2020 MRN: [id number] Attending MD: Ladene Artist , MD Date of Birth: [birth year] CSN: [id number] Age: 70 Admit Type: Outpatient Procedure:                Upper GI endoscopy Indications:              Iron deficiency anemia secondary to chronic blood                            loss, Heme positive stool Providers:                Pricilla Riffle. Fuller Plan, MD, Baird Cancer, RN, Ladona Ridgel, Technician, Clearnce Sorrel, CRNA Referring MD:             [facility] Medicines:                Monitored Anesthesia Care Complications:            No immediate complications. Estimated Blood Loss:     Estimated blood loss: none. Procedure:                Pre-Anesthesia Assessment:                           - Prior to the procedure, a History and Physical                            was performed, and patient medications and                            allergies were reviewed. The patient's tolerance of                            previous anesthesia was also reviewed. The risks                            and benefits of the procedure and the sedation                            options and risks were discussed with the patient.                            All questions were answered, and informed consent                            was obtained. Prior Anticoagulants: The patient has                            taken no previous anticoagulant or antiplatelet                            agents. ASA Grade Assessment: III - A patient with  severe systemic disease. After reviewing the risks                            and benefits, the patient was deemed in                            satisfactory condition to undergo the procedure.                           After obtaining informed consent, the endoscope was                            passed under direct vision. Throughout the                             procedure, the patient's blood pressure, pulse, and                            oxygen saturations were monitored continuously. The                            GIF-H190 ([id number]) Olympus gastroscope was                            introduced through the mouth, and advanced to the                            fourth part of duodenum. The upper GI endoscopy was                            accomplished without difficulty. The patient                            tolerated the procedure well. Scope In: Scope Out: Findings:      The examined esophagus was normal.      A large (10cm) hiatal hernia was present.      The exam of the stomach was otherwise normal. Not able to insufflate       adequately with large HH reducing the ability to retain CO2. No Cameron       erosions or other lesions noted however the lack of full insufflation       limited the exam somewhat.      A single 3 mm angiodysplastic lesion without bleeding was found in the       fourth portion of the duodenum. Coagulation for bleeding prevention       using argon plasma was successful.      The exam of the duodenum was otherwise normal. Impression:               - Normal esophagus.                           - Large hiatal hernia.                           - A single non-bleeding angiodysplastic lesion in  the duodenum. Treated with argon plasma coagulation                            (APC).                           - No specimens collected. Recommendation:           - Return patient to hospital ward for ongoing care.                           - Soft diet today.                           - Continue present medications.                           - Return to primary care physician at [facility] at the                            next available appointment for ongoing management                            of IDA and to consider repeat colonoscopy +/- VCE                            as outpatient.                            - GI signing off. OK for discharge from GI                            standpoint. Procedure Code(s):        --- Professional ---                           [id number], Esophagogastroduodenoscopy, flexible,                            transoral; with control of bleeding, any method Diagnosis Code(s):        --- Professional ---                           K44.9, Diaphragmatic hernia without obstruction or                            gangrene                           K31.819, Angiodysplasia of stomach and duodenum                            without bleeding                           D50.0, Iron deficiency anemia secondary to blood  loss (chronic)                           R19.5, Other fecal abnormalities CPT copyright 2019 American Medical Association. All rights reserved. The codes documented in this report are preliminary and upon coder review may  be revised to meet current compliance requirements. Ladene Artist, MD 12/26/2020 11:42:39 AM This report has been signed electronically. Number of Addenda: 0

## 2020-12-26 NOTE — Progress Notes (Signed)
Echocardiogram 2D Echocardiogram has been performed.  Oneal Deputy Aimy Sweeting 12/26/2020, 9:07 AM

## 2020-12-26 NOTE — Anesthesia Postprocedure Evaluation (Signed)
Anesthesia Post Note  Patient: [name]  Procedure(s) Performed: ESOPHAGOGASTRODUODENOSCOPY (EGD) WITH PROPOFOL HOT HEMOSTASIS (ARGON PLASMA COAGULATION/BICAP)     Patient location during evaluation: Endoscopy Anesthesia Type: MAC Level of consciousness: awake Pain management: pain level controlled Vital Signs Assessment: post-procedure vital signs reviewed and stable Respiratory status: spontaneous breathing Cardiovascular status: stable Postop Assessment: no apparent nausea or vomiting Anesthetic complications: no   No notable events documented.  Last Vitals:  Vitals:   12/26/20 1205 12/26/20 1300  BP: (!) 152/74 128/75  Pulse: 71 73  Resp: 19 14  Temp: (!) 36.4 C   SpO2: 99% 99%    Last Pain:  Vitals:   12/26/20 1205  TempSrc: Oral  PainSc:                  Kirstyn Lean

## 2020-12-26 NOTE — Interval H&P Note (Signed)
History and Physical Interval Note:  12/26/2020 10:47 AM  [name]  has presented today for surgery, with the diagnosis of Iron deficiency anemia.  FOBT positive stool.  History large hiatal hernia and Cameron's erosions..  The various methods of treatment have been discussed with the patient and family. After consideration of risks, benefits and other options for treatment, the patient has consented to  Procedure(s): ESOPHAGOGASTRODUODENOSCOPY (EGD) WITH PROPOFOL (N/A) as a surgical intervention.  The patient's history has been reviewed, patient examined, no change in status, stable for surgery.  I have reviewed the patient's chart and labs.  Questions were answered to the patient's satisfaction.     Pricilla Riffle. Fuller Plan

## 2020-12-26 NOTE — Transfer of Care (Signed)
Immediate Anesthesia Transfer of Care Note  Patient: [name]  Procedure(s) Performed: ESOPHAGOGASTRODUODENOSCOPY (EGD) WITH PROPOFOL HOT HEMOSTASIS (ARGON PLASMA COAGULATION/BICAP)  Patient Location: Endoscopy Unit  Anesthesia Type:MAC  Level of Consciousness: drowsy  Airway & Oxygen Therapy: Patient Spontanous Breathing  Post-op Assessment: Report given to RN and Post -op Vital signs reviewed and stable  Post vital signs: Reviewed and stable  Last Vitals:  Vitals Value Taken Time  BP    Temp    Pulse    Resp    SpO2      Last Pain:  Vitals:   12/26/20 1003  TempSrc: Oral  PainSc: 8          Complications: No notable events documented.

## 2020-12-26 NOTE — Anesthesia Preprocedure Evaluation (Addendum)
Anesthesia Evaluation  Patient identified by MRN, date of birth, ID band Patient awake    Reviewed: Allergy & Precautions, Patient's Chart, lab work & pertinent test results  History of Anesthesia Complications (+) PONVNegative for: history of anesthetic complications  Airway Mallampati: II  TM Distance: >3 FB     Dental   Pulmonary neg pulmonary ROS, Current Smoker and Patient abstained from smoking.,    breath sounds clear to auscultation       Cardiovascular hypertension,  Rhythm:Regular Rate:Normal     Neuro/Psych  Headaches, PSYCHIATRIC DISORDERS Depression  Neuromuscular disease CVA (2016)    GI/Hepatic hiatal hernia, PUD, GERD  ,(+) Hepatitis -, C  Endo/Other  negative endocrine ROS  Renal/GU ARFRenal disease (AKI)  negative genitourinary   Musculoskeletal  (+) Arthritis ,   Abdominal   Peds  Hematology  (+) anemia ,   Anesthesia Other Findings  Presented with severe anemia and AKI, s/p multiple units PRBC, hgb now 8.1  Reproductive/Obstetrics                          Anesthesia Physical Anesthesia Plan  ASA: 3  Anesthesia Plan: MAC   Post-op Pain Management:    Induction: Intravenous  PONV Risk Score and Plan: 1 and Propofol infusion, TIVA and Treatment may vary due to age or medical condition  Airway Management Planned: Nasal Cannula and Simple Face Mask  Additional Equipment: None  Intra-op Plan:   Post-operative Plan:   Informed Consent: I have reviewed the patients History and Physical, chart, labs and discussed the procedure including the risks, benefits and alternatives for the proposed anesthesia with the patient or authorized representative who has indicated his/her understanding and acceptance.     Dental advisory given  Plan Discussed with: CRNA and Anesthesiologist  Anesthesia Plan Comments:        Anesthesia Quick Evaluation

## 2020-12-26 NOTE — Progress Notes (Signed)
PROGRESS NOTE    [name]  GNF:[id number] DOB: Oct 06, 1950 DOA: 12/24/2020 PCP: Billie Ruddy, MD   Brief Narrative:  70 year old with history of CVA, gastritis/esophagitis, anemia requiring multiple transfusion, HTN was sent to the hospital by PCP after finding to have hemoglobin of 3.8.  Patient had been reporting of symptomatic anemia.  GI consulted EGD performed 6/10 which showed normal esophagus, large hiatal hernia, nonbleeding angiodysplastic lesion treated with APC.   Assessment & Plan:   Principal Problem:   Symptomatic anemia Active Problems:   HTN (hypertension)   Tobacco abuse   AKI (acute kidney injury) ([place])   History of CVA (cerebrovascular accident)   Bilateral leg edema   Symptomatic anemia - Has previous history of gastritis and esophagitis - Resume oral PPI, soft diet. - Seen by GI, status post EGD-normal esophagus, large hiatal hernia, nonbleeding angioplastic lesion treated with APC. - PRBC transfusion to keep hemoglobin greater than 7.0 - No obvious source  Acute kidney injury, creatinine today 1.3 - Admission creatinine 1.4, baseline 0.9.  IV fluids  Severe iron deficiency -Status post IV iron 6/9.  Will need p.o. with supplements upon discharge  Bilateral lower extremity edema, minimal -Lower extremity Dopplers-negative for DVT - Echocardiogram-  Essential hypertension - Hold off on any home medications  History of CVA - Not on any home medications?  Tobacco use -Nicotine patch as needed   DVT prophylaxis: SCDs Start: 12/24/20 2036 Code Status: Full code Family Communication:  Called [place] [phone]  Status is: Inpatient   Dispo: The patient is from: Home              Anticipated d/c is to: Home              Patient currently is not medically stable to d/c.  We will see how he tolerates his diet today after the procedure and if hemoglobin remains stable he should be able to go tomorrow.   Difficult to place patient  No   Subjective: Feels okay no complaints  Review of Systems Otherwise negative except as per HPI, including: General: Denies fever, chills, night sweats or unintended weight loss. Resp: Denies cough, wheezing, shortness of breath. Cardiac: Denies chest pain, palpitations, orthopnea, paroxysmal nocturnal dyspnea. GI: Denies abdominal pain, nausea, vomiting, diarrhea or constipation GU: Denies dysuria, frequency, hesitancy or incontinence MS: Denies muscle aches, joint pain or swelling Neuro: Denies headache, neurologic deficits (focal weakness, numbness, tingling), abnormal gait Psych: Denies anxiety, depression, SI/HI/AVH Skin: Denies new rashes or lesions ID: Denies sick contacts, exotic exposures, travel  Examination: Constitutional: Not in acute distress Respiratory: Clear to auscultation bilaterally Cardiovascular: Normal sinus rhythm, no rubs Abdomen: Nontender nondistended good bowel sounds Musculoskeletal: No edema noted Skin: No rashes seen Neurologic: CN 2-12 grossly intact.  And nonfocal Psychiatric: Normal judgment and insight. Alert and oriented x 3. Normal mood.   Objective: Vitals:   12/25/20 1307 12/25/20 1345 12/25/20 2039 12/26/20 0500  BP: 137/73 (!) 123/56 (!) 140/58 (!) 145/48  Pulse: 72 69 75 66  Resp: 15 17 20 18   Temp: 97.9 F (36.6 C) 97.9 F (36.6 C) 97.9 F (36.6 C) 97.9 F (36.6 C)  TempSrc: Oral Oral Oral Axillary  SpO2: 99% 99% 99% 100%  Weight:      Height:        Intake/Output Summary (Last 24 hours) at 12/26/2020 0836 Last data filed at 12/26/2020 0159 Gross per 24 hour  Intake 1260.07 ml  Output 1800 ml  Net -  539.93 ml   Filed Weights   12/25/20 0602  Weight: 78.5 kg     Data Reviewed:   CBC: Recent Labs  Lab 12/24/20 1120 12/24/20 1640 12/25/20 0621 12/25/20 1511 12/26/20 0207  WBC 4.4 5.1 6.3  --  7.6  NEUTROABS 2.6 4.0  --   --   --   HGB 3.8 Repeated and verified X2.* 3.7* 5.8* 8.0* 8.1*  HCT 13.6 Repeated  and verified X2.* 14.2* 19.4* 25.8* 26.5*  MCV 72.9* 79.3* 80.8  --  82.3  PLT 481.0* 460* 371  --  937   Basic Metabolic Panel: Recent Labs  Lab 12/24/20 1120 12/24/20 1640 12/25/20 0621 12/26/20 0207  NA 138 137 137 139  K 4.4 4.1 4.0 4.0  CL 108 109 109 110  CO2 23 20* 21* 20*  GLUCOSE 95 90 83 97  BUN 33* 29* 24* 19  CREATININE 1.40 1.40* 1.24 1.31*  CALCIUM 8.5 8.5* 8.5* 8.5*  MG  --   --   --  2.0   GFR: Estimated Creatinine Clearance: 51.7 mL/min (A) (by C-G formula based on SCr of 1.31 mg/dL (H)). Liver Function Tests: Recent Labs  Lab 12/24/20 1120 12/24/20 1907  AST 10 14*  ALT 9 11  ALKPHOS 102 90  BILITOT 0.3 0.3  PROT 6.4 6.0*  ALBUMIN 3.8 3.1*   No results for input(s): LIPASE, AMYLASE in the last 168 hours. No results for input(s): AMMONIA in the last 168 hours. Coagulation Profile: Recent Labs  Lab 12/25/20 1511  INR 1.4*   Cardiac Enzymes: No results for input(s): CKTOTAL, CKMB, CKMBINDEX, TROPONINI in the last 168 hours. BNP (last 3 results) Recent Labs    12/24/20 1120  PROBNP 65.0   HbA1C: No results for input(s): HGBA1C in the last 72 hours. CBG: No results for input(s): GLUCAP in the last 168 hours. Lipid Profile: No results for input(s): CHOL, HDL, LDLCALC, TRIG, CHOLHDL, LDLDIRECT in the last 72 hours. Thyroid Function Tests: Recent Labs    12/24/20 1120  TSH 1.01  FREET4 0.64   Anemia Panel: Recent Labs    12/24/20 1907 12/24/20 1910  VITAMINB12 534  --   FOLATE  --  20.0  FERRITIN 3*  --   TIBC 392  --   IRON 13*  --   RETICCTPCT  --  8.6*   Sepsis Labs: No results for input(s): PROCALCITON, LATICACIDVEN in the last 168 hours.  Recent Results (from the past 240 hour(s))  Resp Panel by RT-PCR (Flu A&B, Covid) Nasopharyngeal Swab     Status: None   Collection Time: 12/24/20  6:11 PM   Specimen: Nasopharyngeal Swab; Nasopharyngeal(NP) swabs in vial transport medium  Result Value Ref Range Status   SARS  Coronavirus 2 by RT PCR NEGATIVE NEGATIVE Final    Comment: (NOTE) SARS-CoV-2 target nucleic acids are NOT DETECTED.  The SARS-CoV-2 RNA is generally detectable in upper respiratory specimens during the acute phase of infection. The lowest concentration of SARS-CoV-2 viral copies this assay can detect is 138 copies/mL. A negative result does not preclude SARS-Cov-2 infection and should not be used as the sole basis for treatment or other patient management decisions. A negative result may occur with  improper specimen collection/handling, submission of specimen other than nasopharyngeal swab, presence of viral mutation(s) within the areas targeted by this assay, and inadequate number of viral copies(<138 copies/mL). A negative result must be combined with clinical observations, patient history, and epidemiological information. The expected result is Negative.  Fact Sheet for Patients:  EntrepreneurPulse.com.au  Fact Sheet for Healthcare Providers:  IncredibleEmployment.be  This test is no t yet approved or cleared by the [place] FDA and  has been authorized for detection and/or diagnosis of SARS-CoV-2 by FDA under an Emergency Use Authorization (EUA). This EUA will remain  in effect (meaning this test can be used) for the duration of the COVID-19 declaration under Section 564(b)(1) of the Act, 21 U.S.C.section 360bbb-3(b)(1), unless the authorization is terminated  or revoked sooner.       Influenza A by PCR NEGATIVE NEGATIVE Final   Influenza B by PCR NEGATIVE NEGATIVE Final    Comment: (NOTE) The Xpert Xpress SARS-CoV-2/FLU/RSV plus assay is intended as an aid in the diagnosis of influenza from Nasopharyngeal swab specimens and should not be used as a sole basis for treatment. Nasal washings and aspirates are unacceptable for Xpert Xpress SARS-CoV-2/FLU/RSV testing.  Fact Sheet for  Patients: EntrepreneurPulse.com.au  Fact Sheet for Healthcare Providers: IncredibleEmployment.be  This test is not yet approved or cleared by the [place] FDA and has been authorized for detection and/or diagnosis of SARS-CoV-2 by FDA under an Emergency Use Authorization (EUA). This EUA will remain in effect (meaning this test can be used) for the duration of the COVID-19 declaration under Section 564(b)(1) of the Act, 21 U.S.C. section 360bbb-3(b)(1), unless the authorization is terminated or revoked.  Performed at [facility], [place] [address] [place], [place] [address]          Radiology Studies: VAS [place] LOWER EXTREMITY VENOUS (DVT)  Result Date: 12/25/2020  Lower Venous DVT Study Patient Name:  KOEN ANTILLA  Date of Exam:   12/25/2020 Medical Rec #: [id number]         Accession #:    [id number] Date of Birth: [birth year]          Patient Gender: M Patient Age:   69Y Exam Location:  [facility] Procedure:      VAS [place] LOWER EXTREMITY VENOUS (DVT) Referring Phys: [id number] R[place] --------------------------------------------------------------------------------  Indications: Edema.  Comparison Study: No prior study Performing Technologist: Maudry Mayhew MHA, RDMS, RVT, RDCS  Examination Guidelines: A complete evaluation includes B-mode imaging, spectral Doppler, color Doppler, and power Doppler as needed of all accessible portions of each vessel. Bilateral testing is considered an integral part of a complete examination. Limited examinations for reoccurring indications may be performed as noted. The reflux portion of the exam is performed with the patient in reverse Trendelenburg.  +---------+---------------+---------+-----------+----------+--------------+ RIGHT    CompressibilityPhasicitySpontaneityPropertiesThrombus Aging +---------+---------------+---------+-----------+----------+--------------+ CFV      Full            Yes      Yes                                 +---------+---------------+---------+-----------+----------+--------------+ SFJ      Full                                                        +---------+---------------+---------+-----------+----------+--------------+ FV Prox  Full                                                        +---------+---------------+---------+-----------+----------+--------------+  FV Mid   Full                                                        +---------+---------------+---------+-----------+----------+--------------+ FV DistalFull                                                        +---------+---------------+---------+-----------+----------+--------------+ PFV      Full                                                        +---------+---------------+---------+-----------+----------+--------------+ POP      Full           Yes      Yes                                 +---------+---------------+---------+-----------+----------+--------------+ PTV      Full                                                        +---------+---------------+---------+-----------+----------+--------------+ PERO     Full                                                        +---------+---------------+---------+-----------+----------+--------------+   +---------+---------------+---------+-----------+----------+--------------+ LEFT     CompressibilityPhasicitySpontaneityPropertiesThrombus Aging +---------+---------------+---------+-----------+----------+--------------+ CFV      Full           Yes      Yes                                 +---------+---------------+---------+-----------+----------+--------------+ SFJ      Full                                                        +---------+---------------+---------+-----------+----------+--------------+ FV Prox  Full                                                         +---------+---------------+---------+-----------+----------+--------------+ FV Mid   Full                                                        +---------+---------------+---------+-----------+----------+--------------+  FV DistalFull                                                        +---------+---------------+---------+-----------+----------+--------------+ PFV      Full                                                        +---------+---------------+---------+-----------+----------+--------------+ POP      Full           Yes      Yes                                 +---------+---------------+---------+-----------+----------+--------------+ PTV      Full                                                        +---------+---------------+---------+-----------+----------+--------------+ PERO     Full                                                        +---------+---------------+---------+-----------+----------+--------------+     Summary: RIGHT: - There is no evidence of deep vein thrombosis in the lower extremity.  - No cystic structure found in the popliteal fossa.  LEFT: - There is no evidence of deep vein thrombosis in the lower extremity.  - No cystic structure found in the popliteal fossa.  *See table(s) above for measurements and observations. Electronically signed by Monica [place] MD on 12/25/2020 at 7:21:36 PM.    Final         Scheduled Meds:  buPROPion  200 mg Oral Daily   ferrous sulfate  325 mg Oral TID WC   nicotine  7 mg Transdermal Daily   pantoprazole (PROTONIX) IV  40 mg Intravenous Q12H   QUEtiapine  200 mg Oral QHS   Continuous Infusions:  lactated ringers 100 mL/hr at 12/26/20 0739     LOS: 2 days   Time spent= 35 mins    Ople Girgis Arsenio Loader, MD [facility]  If 7PM-7AM, please contact night-coverage  12/26/2020, 8:36 AM

## 2020-12-26 NOTE — Care Management Important Message (Signed)
Important Message  Patient Details  Name: [name] MRN: [id number] Date of Birth: Jan 15, 1951   Medicare Important Message Given:  Yes     Shela Esses 12/26/2020, 11:21 AM

## 2020-12-27 LAB — CBC
HCT: 27.3 % — ABNORMAL LOW (ref 39.0–52.0)
Hemoglobin: 8.1 g/dL — ABNORMAL LOW (ref 13.0–17.0)
MCH: 24.8 pg — ABNORMAL LOW (ref 26.0–34.0)
MCHC: 29.7 g/dL — ABNORMAL LOW (ref 30.0–36.0)
MCV: 83.7 fL (ref 80.0–100.0)
Platelets: 334 10*3/uL (ref 150–400)
RBC: 3.26 MIL/uL — ABNORMAL LOW (ref 4.22–5.81)
RDW: 21.2 % — ABNORMAL HIGH (ref 11.5–15.5)
WBC: 7.9 10*3/uL (ref 4.0–10.5)
nRBC: 0.4 % — ABNORMAL HIGH (ref 0.0–0.2)

## 2020-12-27 LAB — BASIC METABOLIC PANEL
Anion gap: 9 (ref 5–15)
BUN: 13 mg/dL (ref 8–23)
CO2: 20 mmol/L — ABNORMAL LOW (ref 22–32)
Calcium: 8.2 mg/dL — ABNORMAL LOW (ref 8.9–10.3)
Chloride: 108 mmol/L (ref 98–111)
Creatinine, Ser: 1.08 mg/dL (ref 0.61–1.24)
GFR, Estimated: >60 mL/min (ref >60)
Glucose, Bld: 118 mg/dL — ABNORMAL HIGH (ref 70–99)
Potassium: 3.5 mmol/L (ref 3.5–5.1)
Sodium: 137 mmol/L (ref 135–145)

## 2020-12-27 LAB — MAGNESIUM: Magnesium: 1.9 mg/dL (ref 1.7–2.4)

## 2020-12-27 MED ORDER — ACETAMINOPHEN 325 MG PO TABS: 650.0000 mg | ORAL_TABLET | Freq: Four times a day (QID) | ORAL | Status: AC | PRN

## 2020-12-27 MED ORDER — SENNOSIDES-DOCUSATE SODIUM 8.6-50 MG PO TABS: 1.0000 | ORAL_TABLET | Freq: Two times a day (BID) | ORAL | 0 refills | Status: DC

## 2020-12-27 MED ORDER — POTASSIUM CHLORIDE 20 MEQ PO PACK
40.0000 meq | PACK | Freq: Once | ORAL | Status: AC
  Administered 2020-12-27: 40 meq via ORAL
  Filled 2020-12-27: qty 2

## 2020-12-27 NOTE — Discharge Summary (Signed)
Physician Discharge Summary  [name] WHQ:[id number] DOB: [birth year] DOA: 12/24/2020  PCP: Billie Ruddy, MD  Admit date: 12/24/2020 Discharge date: 12/27/2020  Admitted From: Home Disposition:  Home   Recommendations for Outpatient Follow-up:  Follow up with PCP in 1 weeks Please obtain CBC in one week your next doctors visit.  Take PPI twice daily Will need further work-up including [facility] with [facility].   Discharge Condition: Stable CODE STATUS: Full code Diet recommendation: 2 g salt  Brief/Interim Summary: 70 year old with history of CVA, gastritis/esophagitis, anemia requiring multiple transfusion, HTN was sent to the hospital by PCP after finding to have hemoglobin of 3.8.  Patient had been reporting of symptomatic anemia.  GI consulted EGD performed 6/10 which showed normal esophagus, large hiatal hernia, nonbleeding angiodysplastic lesion treated with APC.  Postprocedure hemoglobin remained stable at 8.1 without any further evidence of bleeding.  Rest of the recommendations as above. Stable for discharge from the hospital today.    Symptomatic anemia - Has previous history of gastritis and esophagitis - PPI twice daily - Seen by GI, status post EGD-normal esophagus, large hiatal hernia, nonbleeding angioplastic lesion treated with APC. - PRBC transfusion to keep hemoglobin greater than 7.0 - No obvious source   Acute kidney injury, creatinine today 1.08 - Resolved.  Admission creatinine 1.4, baseline 0.9.  I   Severe iron deficiency -Status post IV iron 6/9.  Iron supplements with bowel regimen  Bilateral lower extremity edema, minimal -Lower extremity Dopplers-negative for DVT - Echocardiogram-60 to 65%, grade 2 DD.  Essential hypertension - Hold off on any home medications  History of CVA - Not on any home medications?  Tobacco use -Nicotine patch as needed  Body mass index is 25.54 kg/m.         Discharge Diagnoses:  Principal Problem:    Symptomatic anemia Active Problems:   HTN (hypertension)   Tobacco abuse   AKI (acute kidney injury) ([place])   History of CVA (cerebrovascular accident)   Bilateral leg edema   Occult blood in stools   AVM (arteriovenous malformation) of small bowel, acquired      Consultations: GI  Subjective: Feels okay no complaints.  No further evidence of bleeding.  Tolerating oral diet.  Discharge Exam: Vitals:   12/27/20 0500 12/27/20 0745  BP:  129/83  Pulse:  73  Resp:  16  Temp: 97.6 F (36.4 C) 98.7 F (37.1 C)  SpO2:  100%   Vitals:   12/26/20 1925 12/27/20 0300 12/27/20 0500 12/27/20 0745  BP: (!) 134/54   129/83  Pulse: 75   73  Resp: 19   16  Temp: 98.2 F (36.8 C) 97.6 F (36.4 C) 97.6 F (36.4 C) 98.7 F (37.1 C)  TempSrc: Oral Oral Oral Oral  SpO2: 99%   100%  Weight:      Height:        General: Pt is alert, awake, not in acute distress Cardiovascular: RRR, S1/S2 +, no rubs, no gallops Respiratory: CTA bilaterally, no wheezing, no rhonchi Abdominal: Soft, NT, ND, bowel sounds + Extremities: no edema, no cyanosis  Discharge Instructions   Allergies as of 12/27/2020       Reactions   Penicillins Anaphylaxis   Has patient had a PCN reaction causing immediate rash, facial/tongue/throat swelling, SOB or lightheadedness with hypotension: Yes Has patient had a PCN reaction causing severe rash involving mucus membranes or skin necrosis: No Has patient had a PCN reaction that required hospitalization: No Has patient had  a PCN reaction occurring within the last 10 years: No If all of the above answers are "NO", then may proceed with Cephalosporin use.[name] [pseudoephedrine] Palpitations, Other (See Comments)   Heart "races"        Medication List     TAKE these medications    acetaminophen 325 MG tablet Commonly known as: TYLENOL Take 2 tablets (650 mg total) by mouth every 6 (six) hours as needed for moderate pain or headache.   Blood  Pressure Monitor/L Cuff Misc Use as directed daily.   buPROPion 200 MG 12 hr tablet Commonly known as: WELLBUTRIN SR TAKE 1 TABLET BY MOUTH DAILY   chlorthalidone 25 MG tablet Commonly known as: HYGROTON Take 1 tablet (25 mg total) by mouth daily.   cyclobenzaprine 5 MG tablet Commonly known as: FLEXERIL TAKE 1 TABLET BY MOUTH TWICE A DAY AS NEEDED FOR MUSCLE SPASMS What changed: See the new instructions.   hydrALAZINE 25 MG tablet Commonly known as: APRESOLINE Take 1 tablet (25 mg total) by mouth 3 (three) times daily.   lisinopril 40 MG tablet Commonly known as: ZESTRIL Take 40 mg by mouth daily.   magnesium oxide 400 MG tablet Commonly known as: MAG-OX Take 400 mg by mouth daily.   MULTIVITAMINS PO Take 1 tablet by mouth daily.   pantoprazole 40 MG tablet Commonly known as: PROTONIX TAKE 1 TABLET BY MOUTH TWICE A DAY BEFORE MEALS What changed: See the new instructions.   potassium chloride SA 20 MEQ tablet Commonly known as: KLOR-CON Take 1 tablet (20 mEq total) by mouth daily.   QUEtiapine 200 MG tablet Commonly known as: SEROQUEL Take 1 tablet (200 mg total) by mouth at bedtime.   senna-docusate 8.6-50 MG tablet Commonly known as: Senokot-S Take 1 tablet by mouth 2 (two) times daily.       ASK your doctor about these medications    ferrous sulfate 325 (65 FE) MG tablet TAKE 1 TABLET BY MOUTH THREE TWICE A DAY WITH MEALS   sertraline 100 MG tablet Commonly known as: ZOLOFT Take by mouth.   sodium chloride 0.65 % Soln nasal spray Commonly known as: OCEAN Place 1 spray into both nostrils as needed for congestion.        Follow-up Information     Billie Ruddy, MD. Call in 1 week(s).   Specialty: Family Medicine Contact information: [place] [address] [phone]                Allergies  Allergen Reactions   Penicillins Anaphylaxis    Has patient had a PCN reaction causing immediate rash,  facial/tongue/throat swelling, SOB or lightheadedness with hypotension: Yes Has patient had a PCN reaction causing severe rash involving mucus membranes or skin necrosis: No Has patient had a PCN reaction that required hospitalization: No Has patient had a PCN reaction occurring within the last 10 years: No If all of the above answers are "NO", then may proceed with Cephalosporin use.[name] [Pseudoephedrine] Palpitations and Other (See Comments)    Heart "races"    You were cared for by a hospitalist during your hospital stay. If you have any questions about your discharge medications or the care you received while you were in the hospital after you are discharged, you can call the unit and asked to speak with the hospitalist on call if the hospitalist that took care of you is not available. Once you are discharged, your primary care physician will  handle any further medical issues. Please note that no refills for any discharge medications will be authorized once you are discharged, as it is imperative that you return to your primary care physician (or establish a relationship with a primary care physician if you do not have one) for your aftercare needs so that they can reassess your need for medications and monitor your lab values.   Procedures/Studies: ECHOCARDIOGRAM COMPLETE  Result Date: 12/26/2020    ECHOCARDIOGRAM REPORT   Patient Name:   Farrell LEE Vanzandt Date of Exam: 12/26/2020 Medical Rec #:  [id number]          Height:       69.0 in Accession #:    [id number]         Weight:       173.1 lb Date of Birth:  [birth year]           BSA:          1.943 m Patient Age:    12 years           BP:           145/48 mmHg Patient Gender: M                  HR:           75 bpm. Exam Location:  Inpatient Procedure: 2D Echo, Color Doppler and Cardiac Doppler Indications:    I42.9 Cardiomyopathy (unspecified)  History:        Patient has prior history of Echocardiogram examinations, most                  recent 07/21/2018. Risk Factors:Hypertension and Dyslipidemia.  Sonographer:    Raquel Sarna Senior RDCS Referring Phys: [phone] Aubriegh Minch CHIRAG Finnbar Cedillos IMPRESSIONS  1. Left ventricular ejection fraction, by estimation, is 60 to 65%. The left ventricle has normal function. The left ventricle has no regional wall motion abnormalities. There is mild left ventricular hypertrophy of the basal-septal segment. Left ventricular diastolic parameters are consistent with Grade II diastolic dysfunction (pseudonormalization).  2. Right ventricular systolic function is normal. The right ventricular size is normal. Tricuspid regurgitation signal is inadequate for assessing PA pressure.  3. Left atrial size was mildly dilated.  4. The mitral valve is normal in structure. Trivial mitral valve regurgitation. No evidence of mitral stenosis.  5. The aortic valve has an indeterminant number of cusps. Aortic valve regurgitation is moderate. Mild to moderate aortic valve sclerosis/calcification is present, without any evidence of aortic stenosis. Aortic regurgitation PHT measures 343 msec.  6. The inferior vena cava is dilated in size with >50% respiratory variability, suggesting right atrial pressure of 8 mmHg. FINDINGS  Left Ventricle: Left ventricular ejection fraction, by estimation, is 60 to 65%. The left ventricle has normal function. The left ventricle has no regional wall motion abnormalities. The left ventricular internal cavity size was normal in size. There is  mild left ventricular hypertrophy of the basal-septal segment. Left ventricular diastolic parameters are consistent with Grade II diastolic dysfunction (pseudonormalization). Normal left ventricular filling pressure. Right Ventricle: The right ventricular size is normal. No increase in right ventricular wall thickness. Right ventricular systolic function is normal. Tricuspid regurgitation signal is inadequate for assessing PA pressure. Left Atrium: Left atrial size was mildly dilated.  Right Atrium: Right atrial size was normal in size. Pericardium: There is no evidence of pericardial effusion. Mitral Valve: The mitral valve is normal in structure. Trivial mitral valve regurgitation. No evidence of mitral valve stenosis.  Tricuspid Valve: The tricuspid valve is normal in structure. Tricuspid valve regurgitation is not demonstrated. No evidence of tricuspid stenosis. Aortic Valve: The aortic valve has an indeterminant number of cusps. Aortic valve regurgitation is moderate. Aortic regurgitation PHT measures 343 msec. Mild to moderate aortic valve sclerosis/calcification is present, without any evidence of aortic stenosis. Pulmonic Valve: The pulmonic valve was normal in structure. Pulmonic valve regurgitation is not visualized. No evidence of pulmonic stenosis. Aorta: The aortic root is normal in size and structure. Venous: The inferior vena cava is dilated in size with greater than 50% respiratory variability, suggesting right atrial pressure of 8 mmHg. IAS/Shunts: No atrial level shunt detected by color flow Doppler.  LEFT VENTRICLE PLAX 2D LVIDd:         4.20 cm  Diastology LVIDs:         2.80 cm  LV e' medial:    7.94 cm/s LV PW:         1.20 cm  LV E/e' medial:  13.0 LV IVS:        1.30 cm  LV e' lateral:   8.27 cm/s LVOT diam:     1.80 cm  LV E/e' lateral: 12.5 LV SV:         73 LV SV Index:   37 LVOT Area:     2.54 cm  RIGHT VENTRICLE RV S prime:     13.80 cm/s TAPSE (M-mode): 2.5 cm LEFT ATRIUM             Index       RIGHT ATRIUM           Index LA diam:        3.00 cm 1.54 cm/m  RA Area:     16.90 cm LA Vol (A2C):   65.8 ml 33.87 ml/m RA Volume:   41.60 ml  21.41 ml/m LA Vol (A4C):   80.4 ml 41.38 ml/m LA Biplane Vol: 77.4 ml 39.84 ml/m  AORTIC VALVE LVOT Vmax:   119.00 cm/s LVOT Vmean:  85.400 cm/s LVOT VTI:    0.285 m AI PHT:      343 msec  AORTA Ao Root diam: 3.50 cm MITRAL VALVE MV Area (PHT): 3.45 cm     SHUNTS MV Decel Time: 220 msec     Systemic VTI:  0.29 m MV E velocity:  103.00 cm/s  Systemic Diam: 1.80 cm MV A velocity: 128.00 cm/s MV E/A ratio:  0.80 Fransico Him MD Electronically signed by Fransico Him MD Signature Date/Time: 12/26/2020/9:47:16 AM    Final    VAS [place] LOWER EXTREMITY VENOUS (DVT)  Result Date: 12/25/2020  Lower Venous DVT Study Patient Name:  RAIJON LINDFORS  Date of Exam:   12/25/2020 Medical Rec #: [id number]         Accession #:    [id number] Date of Birth: 1950/08/29          Patient Gender: M Patient Age:   69Y Exam Location[facility]  [facility] Procedure:      VAS [place] LOWER EXTREMITY VENOUS (DVT) Referring Phys: [id number] [place] --------------------------------------------------------------------------------  Indications: Edema.  Comparison Study: No prior study Performing Technologist: Maudry Mayhew MHA, RDMS, RVT, RDCS  Examination Guidelines: A complete evaluation includes B-mode imaging, spectral Doppler, color Doppler, and power Doppler as needed of all accessible portions of each vessel. Bilateral testing is considered an integral part of a complete examination. Limited examinations for reoccurring indications may be performed as noted. The reflux portion of  the exam is performed with the patient in reverse Trendelenburg.  +---------+---------------+---------+-----------+----------+--------------+ RIGHT    CompressibilityPhasicitySpontaneityPropertiesThrombus Aging +---------+---------------+---------+-----------+----------+--------------+ CFV      Full           Yes      Yes                                 +---------+---------------+---------+-----------+----------+--------------+ SFJ      Full                                                        +---------+---------------+---------+-----------+----------+--------------+ FV Prox  Full                                                        +---------+---------------+---------+-----------+----------+--------------+ FV Mid   Full                                                         +---------+---------------+---------+-----------+----------+--------------+ FV DistalFull                                                        +---------+---------------+---------+-----------+----------+--------------+ PFV      Full                                                        +---------+---------------+---------+-----------+----------+--------------+ POP      Full           Yes      Yes                                 +---------+---------------+---------+-----------+----------+--------------+ PTV      Full                                                        +---------+---------------+---------+-----------+----------+--------------+ PERO     Full                                                        +---------+---------------+---------+-----------+----------+--------------+   +---------+---------------+---------+-----------+----------+--------------+ LEFT     CompressibilityPhasicitySpontaneityPropertiesThrombus Aging +---------+---------------+---------+-----------+----------+--------------+ CFV      Full           Yes      Yes                                 +---------+---------------+---------+-----------+----------+--------------+  SFJ      Full                                                        +---------+---------------+---------+-----------+----------+--------------+ FV Prox  Full                                                        +---------+---------------+---------+-----------+----------+--------------+ FV Mid   Full                                                        +---------+---------------+---------+-----------+----------+--------------+ FV DistalFull                                                        +---------+---------------+---------+-----------+----------+--------------+ PFV      Full                                                         +---------+---------------+---------+-----------+----------+--------------+ POP      Full           Yes      Yes                                 +---------+---------------+---------+-----------+----------+--------------+ PTV      Full                                                        +---------+---------------+---------+-----------+----------+--------------+ PERO     Full                                                        +---------+---------------+---------+-----------+----------+--------------+     Summary: RIGHT: - There is no evidence of deep vein thrombosis in the lower extremity.  - No cystic structure found in the popliteal fossa.  LEFT: - There is no evidence of deep vein thrombosis in the lower extremity.  - No cystic structure found in the popliteal fossa.  *See table(s) above for measurements and observations. Electronically signed by Monica Martinez MD on 12/25/2020 at 7:21:36 PM.    Final      The results of significant diagnostics from this hospitalization (including imaging, microbiology, ancillary and laboratory) are listed below for reference.     Microbiology: Recent Results (from the past 240  hour(s))  Resp Panel by RT-PCR (Flu A&B, Covid) Nasopharyngeal Swab     Status: None   Collection Time: 12/24/20  6:11 PM   Specimen: Nasopharyngeal Swab; Nasopharyngeal(NP) swabs in vial transport medium  Result Value Ref Range Status   SARS Coronavirus 2 by RT PCR NEGATIVE NEGATIVE Final    Comment: (NOTE) SARS-CoV-2 target nucleic acids are NOT DETECTED.  The SARS-CoV-2 RNA is generally detectable in upper respiratory specimens during the acute phase of infection. The lowest concentration of SARS-CoV-2 viral copies this assay can detect is 138 copies/mL. A negative result does not preclude SARS-Cov-2 infection and should not be used as the sole basis for treatment or other patient management decisions. A negative result may occur with  improper specimen  collection/handling, submission of specimen other than nasopharyngeal swab, presence of viral mutation(s) within the areas targeted by this assay, and inadequate number of viral copies(<138 copies/mL). A negative result must be combined with clinical observations, patient history, and epidemiological information. The expected result is Negative.  Fact Sheet for Patients:  EntrepreneurPulse.com.au  Fact Sheet for Healthcare Providers:  IncredibleEmployment.be  This test is no t yet approved or cleared by the [place] FDA and  has been authorized for detection and/or diagnosis of SARS-CoV-2 by FDA under an Emergency Use Authorization (EUA). This EUA will remain  in effect (meaning this test can be used) for the duration of the COVID-19 declaration under Section 564(b)(1) of the Act, 21 U.S.C.section 360bbb-3(b)(1), unless the authorization is terminated  or revoked sooner.       Influenza A by PCR NEGATIVE NEGATIVE Final   Influenza B by PCR NEGATIVE NEGATIVE Final    Comment: (NOTE) The Xpert Xpress SARS-CoV-2/FLU/RSV plus assay is intended as an aid in the diagnosis of influenza from Nasopharyngeal swab specimens and should not be used as a sole basis for treatment. Nasal washings and aspirates are unacceptable for Xpert Xpress SARS-CoV-2/FLU/RSV testing.  Fact Sheet for Patients: EntrepreneurPulse.com.au  Fact Sheet for Healthcare Providers: IncredibleEmployment.be  This test is not yet approved or cleared by the [place] FDA and has been authorized for detection and/or diagnosis of SARS-CoV-2 by FDA under an Emergency Use Authorization (EUA). This EUA will remain in effect (meaning this test can be used) for the duration of the COVID-19 declaration under Section 564(b)(1) of the Act, 21 U.S.C. section 360bbb-3(b)(1), unless the authorization is terminated or revoked.  Performed at [facility], [address], [place], [place] [address]      Labs: BNP (last 3 results) Recent Labs    01/31/20 1127  BNP 24   Basic Metabolic Panel: Recent Labs  Lab 12/24/20 1120 12/24/20 1640 12/25/20 0621 12/26/20 0207 12/27/20 0123  NA 138 137 137 139 137  K 4.4 4.1 4.0 4.0 3.5  CL 108 109 109 110 108  CO2 23 20* 21* 20* 20*  GLUCOSE 95 90 83 97 118*  BUN 33* 29* 24* 19 13  CREATININE 1.40 1.40* 1.24 1.31* 1.08  CALCIUM 8.5 8.5* 8.5* 8.5* 8.2*  MG  --   --   --  2.0 1.9   Liver Function Tests: Recent Labs  Lab 12/24/20 1120 12/24/20 1907  AST 10 14*  ALT 9 11  ALKPHOS 102 90  BILITOT 0.3 0.3  PROT 6.4 6.0*  ALBUMIN 3.8 3.1*   No results for input(s): LIPASE, AMYLASE in the last 168 hours. No results for input(s): AMMONIA in the last 168 hours. CBC: Recent Labs  Lab  12/24/20 1120 12/24/20 1640 12/25/20 0621 12/25/20 1511 12/26/20 0207 12/27/20 0123  WBC 4.4 5.1 6.3  --  7.6 7.9  NEUTROABS 2.6 4.0  --   --   --   --   HGB 3.8 Repeated and verified X2.* 3.7* 5.8* 8.0* 8.1* 8.1*  HCT 13.6 Repeated and verified X2.* 14.2* 19.4* 25.8* 26.5* 27.3*  MCV 72.9* 79.3* 80.8  --  82.3 83.7  PLT 481.0* 460* 371  --  362 334   Cardiac Enzymes: No results for input(s): CKTOTAL, CKMB, CKMBINDEX, TROPONINI in the last 168 hours. BNP: Invalid input(s): POCBNP CBG: No results for input(s): GLUCAP in the last 168 hours. D-Dimer No results for input(s): DDIMER in the last 72 hours. Hgb A1c No results for input(s): HGBA1C in the last 72 hours. Lipid Profile No results for input(s): CHOL, HDL, LDLCALC, TRIG, CHOLHDL, LDLDIRECT in the last 72 hours. Thyroid function studies Recent Labs    12/24/20 1120  TSH 1.01   Anemia work up Recent Labs    12/24/20 1907 12/24/20 1910  VITAMINB12 534  --   FOLATE  --  20.0  FERRITIN 3*  --   TIBC 392  --   IRON 13*  --   RETICCTPCT  --  8.6*   Urinalysis    Component Value Date/Time   COLORURINE YELLOW  03/19/2020 [place] 03/19/2020 2349   LABSPEC 1.010 03/19/2020 2349   PHURINE 5.0 03/19/2020 2349   GLUCOSEU NEGATIVE 03/19/2020 2349   HGBUR NEGATIVE 03/19/2020 2349   BILIRUBINUR NEGATIVE 03/19/2020 2349   BILIRUBINUR n 02/13/2016 1044   KETONESUR NEGATIVE 03/19/2020 2349   PROTEINUR NEGATIVE 03/19/2020 2349   UROBILINOGEN 1.0 02/13/2016 1044   UROBILINOGEN 1.0 01/27/2012 0005   NITRITE NEGATIVE 03/19/2020 2349   LEUKOCYTESUR NEGATIVE 03/19/2020 2349   Sepsis Labs Invalid input(s): PROCALCITONIN,  WBC,  LACTICIDVEN Microbiology Recent Results (from the past 240 hour(s))  Resp Panel by RT-PCR (Flu A&B, Covid) Nasopharyngeal Swab     Status: None   Collection Time: 12/24/20  6:11 PM   Specimen: Nasopharyngeal Swab; Nasopharyngeal(NP) swabs in vial transport medium  Result Value Ref Range Status   SARS Coronavirus 2 by RT PCR NEGATIVE NEGATIVE Final    Comment: (NOTE) SARS-CoV-2 target nucleic acids are NOT DETECTED.  The SARS-CoV-2 RNA is generally detectable in upper respiratory specimens during the acute phase of infection. The lowest concentration of SARS-CoV-2 viral copies this assay can detect is 138 copies/mL. A negative result does not preclude SARS-Cov-2 infection and should not be used as the sole basis for treatment or other patient management decisions. A negative result may occur with  improper specimen collection/handling, submission of specimen other than nasopharyngeal swab, presence of viral mutation(s) within the areas targeted by this assay, and inadequate number of viral copies(<138 copies/mL). A negative result must be combined with clinical observations, patient history, and epidemiological information. The expected result is Negative.  Fact Sheet for Patients:  EntrepreneurPulse.com.au  Fact Sheet for Healthcare Providers:  IncredibleEmployment.be  This test is no t yet approved or cleared by the  [place] FDA and  has been authorized for detection and/or diagnosis of SARS-CoV-2 by FDA under an Emergency Use Authorization (EUA). This EUA will remain  in effect (meaning this test can be used) for the duration of the COVID-19 declaration under Section 564(b)(1) of the Act, 21 U.S.C.section 360bbb-3(b)(1), unless the authorization is terminated  or revoked sooner.       Influenza A by  PCR NEGATIVE NEGATIVE Final   Influenza B by PCR NEGATIVE NEGATIVE Final    Comment: (NOTE) The Xpert Xpress SARS-CoV-2/FLU/RSV plus assay is intended as an aid in the diagnosis of influenza from Nasopharyngeal swab specimens and should not be used as a sole basis for treatment. Nasal washings and aspirates are unacceptable for Xpert Xpress SARS-CoV-2/FLU/RSV testing.  Fact Sheet for Patients: EntrepreneurPulse.com.au  Fact Sheet for Healthcare Providers: IncredibleEmployment.be  This test is not yet approved or cleared by the [place] FDA and has been authorized for detection and/or diagnosis of SARS-CoV-2 by FDA under an Emergency Use Authorization (EUA). This EUA will remain in effect (meaning this test can be used) for the duration of the COVID-19 declaration under Section 564(b)(1) of the Act, 21 U.S.C. section 360bbb-3(b)(1), unless the authorization is terminated or revoked.  Performed at [facility], [place] [address] [place], [place] [address]      Time coordinating discharge:  I have spent 35 minutes face to face with the patient and on the ward discussing the patients care, assessment, plan and disposition with other care givers. >50% of the time was devoted counseling the patient about the risks and benefits of treatment/Discharge disposition and coordinating care.   SIGNED:   Damita Lack, MD  [facility] 12/27/2020, 10:30 AM   If 7PM-7AM, please contact night-coverage

## 2021-01-01 ENCOUNTER — Other Ambulatory Visit: Payer: Self-pay | Admitting: Family Medicine

## 2021-01-01 DIAGNOSIS — I10 Essential (primary) hypertension: Secondary | ICD-10-CM

## 2021-01-02 ENCOUNTER — Other Ambulatory Visit: Payer: Self-pay

## 2021-01-03 ENCOUNTER — Other Ambulatory Visit: Payer: Self-pay

## 2021-01-03 ENCOUNTER — Emergency Department (HOSPITAL_COMMUNITY)
Admission: EM | Admit: 2021-01-03 | Discharge: 2021-01-03 | Disposition: A | Discharge status: Other Acute Inpatient Hospital | Payer: Medicare HMO | Attending: Emergency Medicine | Admitting: Emergency Medicine

## 2021-01-03 DIAGNOSIS — R278 Other lack of coordination: Secondary | ICD-10-CM | POA: Diagnosis not present

## 2021-01-03 DIAGNOSIS — Y998 Other external cause status: Secondary | ICD-10-CM | POA: Diagnosis not present

## 2021-01-03 DIAGNOSIS — E778 Other disorders of glycoprotein metabolism: Secondary | ICD-10-CM | POA: Diagnosis not present

## 2021-01-03 DIAGNOSIS — T24212D Burn of second degree of left thigh, subsequent encounter: Secondary | ICD-10-CM | POA: Diagnosis not present

## 2021-01-03 DIAGNOSIS — T310 Burns involving less than 10% of body surface: Secondary | ICD-10-CM | POA: Diagnosis not present

## 2021-01-03 DIAGNOSIS — Z743 Need for continuous supervision: Secondary | ICD-10-CM | POA: Diagnosis not present

## 2021-01-03 DIAGNOSIS — T24012A Burn of unspecified degree of left thigh, initial encounter: Secondary | ICD-10-CM | POA: Diagnosis not present

## 2021-01-03 DIAGNOSIS — T24211A Burn of second degree of right thigh, initial encounter: Secondary | ICD-10-CM | POA: Diagnosis not present

## 2021-01-03 DIAGNOSIS — I10 Essential (primary) hypertension: Secondary | ICD-10-CM | POA: Diagnosis not present

## 2021-01-03 DIAGNOSIS — R6510 Systemic inflammatory response syndrome (SIRS) of non-infectious origin without acute organ dysfunction: Secondary | ICD-10-CM | POA: Diagnosis not present

## 2021-01-03 DIAGNOSIS — T24211D Burn of second degree of right thigh, subsequent encounter: Secondary | ICD-10-CM | POA: Diagnosis not present

## 2021-01-03 DIAGNOSIS — E8809 Other disorders of plasma-protein metabolism, not elsewhere classified: Secondary | ICD-10-CM | POA: Diagnosis not present

## 2021-01-03 DIAGNOSIS — X118XXA Contact with other hot tap-water, initial encounter: Secondary | ICD-10-CM | POA: Insufficient documentation

## 2021-01-03 DIAGNOSIS — T24232A Burn of second degree of left lower leg, initial encounter: Secondary | ICD-10-CM | POA: Diagnosis not present

## 2021-01-03 DIAGNOSIS — T24212A Burn of second degree of left thigh, initial encounter: Secondary | ICD-10-CM | POA: Diagnosis not present

## 2021-01-03 DIAGNOSIS — T23262A Burn of second degree of back of left hand, initial encounter: Secondary | ICD-10-CM | POA: Diagnosis not present

## 2021-01-03 DIAGNOSIS — T2122XA Burn of second degree of abdominal wall, initial encounter: Secondary | ICD-10-CM | POA: Diagnosis not present

## 2021-01-03 DIAGNOSIS — M6281 Muscle weakness (generalized): Secondary | ICD-10-CM | POA: Diagnosis not present

## 2021-01-03 DIAGNOSIS — D6959 Other secondary thrombocytopenia: Secondary | ICD-10-CM | POA: Diagnosis not present

## 2021-01-03 DIAGNOSIS — D696 Thrombocytopenia, unspecified: Secondary | ICD-10-CM | POA: Diagnosis not present

## 2021-01-03 DIAGNOSIS — T300 Burn of unspecified body region, unspecified degree: Secondary | ICD-10-CM | POA: Diagnosis not present

## 2021-01-03 DIAGNOSIS — R41841 Cognitive communication deficit: Secondary | ICD-10-CM | POA: Diagnosis not present

## 2021-01-03 DIAGNOSIS — R000 Tachycardia, unspecified: Secondary | ICD-10-CM | POA: Diagnosis not present

## 2021-01-03 DIAGNOSIS — Z8546 Personal history of malignant neoplasm of prostate: Secondary | ICD-10-CM | POA: Insufficient documentation

## 2021-01-03 DIAGNOSIS — R001 Bradycardia, unspecified: Secondary | ICD-10-CM | POA: Diagnosis not present

## 2021-01-03 DIAGNOSIS — I639 Cerebral infarction, unspecified: Secondary | ICD-10-CM | POA: Diagnosis not present

## 2021-01-03 DIAGNOSIS — R531 Weakness: Secondary | ICD-10-CM | POA: Diagnosis not present

## 2021-01-03 DIAGNOSIS — T23062A Burn of unspecified degree of back of left hand, initial encounter: Secondary | ICD-10-CM | POA: Diagnosis present

## 2021-01-03 DIAGNOSIS — F17210 Nicotine dependence, cigarettes, uncomplicated: Secondary | ICD-10-CM | POA: Diagnosis not present

## 2021-01-03 DIAGNOSIS — R600 Localized edema: Secondary | ICD-10-CM | POA: Insufficient documentation

## 2021-01-03 DIAGNOSIS — T24219A Burn of second degree of unspecified thigh, initial encounter: Secondary | ICD-10-CM

## 2021-01-03 DIAGNOSIS — D649 Anemia, unspecified: Secondary | ICD-10-CM | POA: Diagnosis not present

## 2021-01-03 DIAGNOSIS — R279 Unspecified lack of coordination: Secondary | ICD-10-CM | POA: Diagnosis not present

## 2021-01-03 DIAGNOSIS — T24201A Burn of second degree of unspecified site of right lower limb, except ankle and foot, initial encounter: Secondary | ICD-10-CM | POA: Diagnosis not present

## 2021-01-03 DIAGNOSIS — Z79899 Other long term (current) drug therapy: Secondary | ICD-10-CM | POA: Diagnosis not present

## 2021-01-03 DIAGNOSIS — R5381 Other malaise: Secondary | ICD-10-CM | POA: Diagnosis not present

## 2021-01-03 DIAGNOSIS — Z993 Dependence on wheelchair: Secondary | ICD-10-CM | POA: Diagnosis not present

## 2021-01-03 DIAGNOSIS — Z20822 Contact with and (suspected) exposure to covid-19: Secondary | ICD-10-CM | POA: Diagnosis not present

## 2021-01-03 DIAGNOSIS — R739 Hyperglycemia, unspecified: Secondary | ICD-10-CM | POA: Diagnosis not present

## 2021-01-03 DIAGNOSIS — E46 Unspecified protein-calorie malnutrition: Secondary | ICD-10-CM | POA: Diagnosis not present

## 2021-01-03 DIAGNOSIS — T24202A Burn of second degree of unspecified site of left lower limb, except ankle and foot, initial encounter: Secondary | ICD-10-CM | POA: Diagnosis not present

## 2021-01-03 DIAGNOSIS — Z23 Encounter for immunization: Secondary | ICD-10-CM | POA: Insufficient documentation

## 2021-01-03 DIAGNOSIS — T24011A Burn of unspecified degree of right thigh, initial encounter: Secondary | ICD-10-CM | POA: Diagnosis not present

## 2021-01-03 DIAGNOSIS — X12XXXA Contact with other hot fluids, initial encounter: Secondary | ICD-10-CM | POA: Diagnosis not present

## 2021-01-03 LAB — CBC WITH DIFFERENTIAL/PLATELET
Abs Immature Granulocytes: 0.04 10*3/uL (ref 0.00–0.07)
Basophils Absolute: 0.1 10*3/uL (ref 0.0–0.1)
Basophils Relative: 1 %
Eosinophils Absolute: 0 10*3/uL (ref 0.0–0.5)
Eosinophils Relative: 0 %
HCT: 36.3 % — ABNORMAL LOW (ref 39.0–52.0)
Hemoglobin: 10.5 g/dL — ABNORMAL LOW (ref 13.0–17.0)
Immature Granulocytes: 0 %
Lymphocytes Relative: 5 %
Lymphs Abs: 0.6 10*3/uL — ABNORMAL LOW (ref 0.7–4.0)
MCH: 26.1 pg (ref 26.0–34.0)
MCHC: 28.9 g/dL — ABNORMAL LOW (ref 30.0–36.0)
MCV: 90.3 fL (ref 80.0–100.0)
Monocytes Absolute: 0.4 10*3/uL (ref 0.1–1.0)
Monocytes Relative: 3 %
Neutro Abs: 9.9 10*3/uL — ABNORMAL HIGH (ref 1.7–7.7)
Neutrophils Relative %: 91 %
Platelets: 509 10*3/uL — ABNORMAL HIGH (ref 150–400)
RBC: 4.02 MIL/uL — ABNORMAL LOW (ref 4.22–5.81)
RDW: 22.8 % — ABNORMAL HIGH (ref 11.5–15.5)
WBC: 10.9 10*3/uL — ABNORMAL HIGH (ref 4.0–10.5)
nRBC: 0 % (ref 0.0–0.2)

## 2021-01-03 LAB — BASIC METABOLIC PANEL
Anion gap: 7 (ref 5–15)
BUN: 22 mg/dL (ref 8–23)
CO2: 21 mmol/L — ABNORMAL LOW (ref 22–32)
Calcium: 8.9 mg/dL (ref 8.9–10.3)
Chloride: 111 mmol/L (ref 98–111)
Creatinine, Ser: 1.24 mg/dL (ref 0.61–1.24)
GFR, Estimated: >60 mL/min (ref >60)
Glucose, Bld: 91 mg/dL (ref 70–99)
Potassium: 4 mmol/L (ref 3.5–5.1)
Sodium: 139 mmol/L (ref 135–145)

## 2021-01-03 LAB — RESP PANEL BY RT-PCR (FLU A&B, COVID) ARPGX2
Influenza A by PCR: NEGATIVE
Influenza B by PCR: NEGATIVE
SARS Coronavirus 2 by RT PCR: NEGATIVE

## 2021-01-03 MED ORDER — TETANUS-DIPHTH-ACELL PERTUSSIS 5-2.5-18.5 LF-MCG/0.5 IM SUSY
0.5000 mL | PREFILLED_SYRINGE | Freq: Once | INTRAMUSCULAR | Status: AC
  Administered 2021-01-03: 0.5 mL via INTRAMUSCULAR
  Filled 2021-01-03: qty 0.5

## 2021-01-03 MED ORDER — OXYCODONE-ACETAMINOPHEN 5-325 MG PO TABS
1.0000 | ORAL_TABLET | Freq: Once | ORAL | Status: AC
  Administered 2021-01-03: 1 via ORAL
  Filled 2021-01-03: qty 1

## 2021-01-03 NOTE — ED Notes (Signed)
saline wraps soaked ABD bilateral legs.

## 2021-01-03 NOTE — ED Notes (Signed)
Social work called, they will be taking his wheel chair.
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
## 2021-01-03 NOTE — ED Notes (Signed)
Patient is refusing second IV stick at this time

## 2021-01-03 NOTE — Care Management (Signed)
Informed [name], patients daughter that he is being transferred to another facility and that they cannot transport his wheelchair with him. The wheelchair will be tagged with his name and for now placed in the [facility]  office. She stated she needed to get someone with a truck to come get it because her car is not big enough. But she will come get it. I told her to inquire at the front desk. For the care manager.
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
## 2021-01-03 NOTE — ED Notes (Signed)
[facility] for transport

## 2021-01-05 ENCOUNTER — Inpatient Hospital Stay: Payer: Medicare HMO | Admitting: Family Medicine

## 2021-01-07 ENCOUNTER — Other Ambulatory Visit: Payer: Self-pay | Admitting: Family Medicine

## 2021-01-08 DIAGNOSIS — T24231A Burn of second degree of right lower leg, initial encounter: Secondary | ICD-10-CM | POA: Diagnosis not present

## 2021-01-08 DIAGNOSIS — R279 Unspecified lack of coordination: Secondary | ICD-10-CM | POA: Diagnosis not present

## 2021-01-08 DIAGNOSIS — M1990 Unspecified osteoarthritis, unspecified site: Secondary | ICD-10-CM | POA: Diagnosis not present

## 2021-01-08 DIAGNOSIS — T24001D Burn of unspecified degree of unspecified site of right lower limb, except ankle and foot, subsequent encounter: Secondary | ICD-10-CM | POA: Diagnosis not present

## 2021-01-08 DIAGNOSIS — G4700 Insomnia, unspecified: Secondary | ICD-10-CM | POA: Diagnosis not present

## 2021-01-08 DIAGNOSIS — C61 Malignant neoplasm of prostate: Secondary | ICD-10-CM | POA: Diagnosis not present

## 2021-01-08 DIAGNOSIS — Z743 Need for continuous supervision: Secondary | ICD-10-CM | POA: Diagnosis not present

## 2021-01-08 DIAGNOSIS — K219 Gastro-esophageal reflux disease without esophagitis: Secondary | ICD-10-CM | POA: Diagnosis not present

## 2021-01-08 DIAGNOSIS — X12XXXA Contact with other hot fluids, initial encounter: Secondary | ICD-10-CM | POA: Diagnosis not present

## 2021-01-08 DIAGNOSIS — T24212D Burn of second degree of left thigh, subsequent encounter: Secondary | ICD-10-CM | POA: Diagnosis not present

## 2021-01-08 DIAGNOSIS — F32A Depression, unspecified: Secondary | ICD-10-CM | POA: Diagnosis not present

## 2021-01-08 DIAGNOSIS — T24232A Burn of second degree of left lower leg, initial encounter: Secondary | ICD-10-CM | POA: Diagnosis not present

## 2021-01-08 DIAGNOSIS — I10 Essential (primary) hypertension: Secondary | ICD-10-CM | POA: Diagnosis not present

## 2021-01-08 DIAGNOSIS — E785 Hyperlipidemia, unspecified: Secondary | ICD-10-CM | POA: Diagnosis not present

## 2021-01-08 DIAGNOSIS — L97129 Non-pressure chronic ulcer of left thigh with unspecified severity: Secondary | ICD-10-CM | POA: Diagnosis not present

## 2021-01-08 DIAGNOSIS — F17200 Nicotine dependence, unspecified, uncomplicated: Secondary | ICD-10-CM | POA: Diagnosis not present

## 2021-01-08 DIAGNOSIS — T24002D Burn of unspecified degree of unspecified site of left lower limb, except ankle and foot, subsequent encounter: Secondary | ICD-10-CM | POA: Diagnosis not present

## 2021-01-08 DIAGNOSIS — T300 Burn of unspecified body region, unspecified degree: Secondary | ICD-10-CM | POA: Diagnosis not present

## 2021-01-08 DIAGNOSIS — T23209A Burn of second degree of unspecified hand, unspecified site, initial encounter: Secondary | ICD-10-CM | POA: Diagnosis not present

## 2021-01-08 DIAGNOSIS — R41841 Cognitive communication deficit: Secondary | ICD-10-CM | POA: Diagnosis not present

## 2021-01-08 DIAGNOSIS — R278 Other lack of coordination: Secondary | ICD-10-CM | POA: Diagnosis not present

## 2021-01-08 DIAGNOSIS — Z8673 Personal history of transient ischemic attack (TIA), and cerebral infarction without residual deficits: Secondary | ICD-10-CM | POA: Diagnosis not present

## 2021-01-08 DIAGNOSIS — M6281 Muscle weakness (generalized): Secondary | ICD-10-CM | POA: Diagnosis not present

## 2021-01-08 DIAGNOSIS — K922 Gastrointestinal hemorrhage, unspecified: Secondary | ICD-10-CM | POA: Diagnosis not present

## 2021-01-08 DIAGNOSIS — I639 Cerebral infarction, unspecified: Secondary | ICD-10-CM | POA: Diagnosis not present

## 2021-01-08 DIAGNOSIS — T24211D Burn of second degree of right thigh, subsequent encounter: Secondary | ICD-10-CM | POA: Diagnosis not present

## 2021-01-08 DIAGNOSIS — R5381 Other malaise: Secondary | ICD-10-CM | POA: Diagnosis not present

## 2021-01-08 DIAGNOSIS — R531 Weakness: Secondary | ICD-10-CM | POA: Diagnosis not present

## 2021-01-08 DIAGNOSIS — B1920 Unspecified viral hepatitis C without hepatic coma: Secondary | ICD-10-CM | POA: Diagnosis not present

## 2021-01-08 DIAGNOSIS — W19XXXA Unspecified fall, initial encounter: Secondary | ICD-10-CM | POA: Diagnosis not present

## 2021-01-09 ENCOUNTER — Telehealth: Payer: Self-pay | Admitting: Family Medicine

## 2021-01-09 DIAGNOSIS — E785 Hyperlipidemia, unspecified: Secondary | ICD-10-CM | POA: Diagnosis not present

## 2021-01-09 DIAGNOSIS — C61 Malignant neoplasm of prostate: Secondary | ICD-10-CM | POA: Diagnosis not present

## 2021-01-09 DIAGNOSIS — K922 Gastrointestinal hemorrhage, unspecified: Secondary | ICD-10-CM | POA: Diagnosis not present

## 2021-01-09 DIAGNOSIS — G4700 Insomnia, unspecified: Secondary | ICD-10-CM | POA: Diagnosis not present

## 2021-01-09 DIAGNOSIS — K219 Gastro-esophageal reflux disease without esophagitis: Secondary | ICD-10-CM | POA: Diagnosis not present

## 2021-01-09 DIAGNOSIS — I639 Cerebral infarction, unspecified: Secondary | ICD-10-CM | POA: Diagnosis not present

## 2021-01-09 DIAGNOSIS — I10 Essential (primary) hypertension: Secondary | ICD-10-CM | POA: Diagnosis not present

## 2021-01-09 DIAGNOSIS — B1920 Unspecified viral hepatitis C without hepatic coma: Secondary | ICD-10-CM | POA: Diagnosis not present

## 2021-01-09 DIAGNOSIS — F32A Depression, unspecified: Secondary | ICD-10-CM | POA: Diagnosis not present

## 2021-01-09 DIAGNOSIS — F5101 Primary insomnia: Secondary | ICD-10-CM

## 2021-01-14 DIAGNOSIS — I10 Essential (primary) hypertension: Secondary | ICD-10-CM | POA: Diagnosis not present

## 2021-01-14 DIAGNOSIS — T24001D Burn of unspecified degree of unspecified site of right lower limb, except ankle and foot, subsequent encounter: Secondary | ICD-10-CM | POA: Diagnosis not present

## 2021-01-14 DIAGNOSIS — T24002D Burn of unspecified degree of unspecified site of left lower limb, except ankle and foot, subsequent encounter: Secondary | ICD-10-CM | POA: Diagnosis not present

## 2021-01-14 DIAGNOSIS — F32A Depression, unspecified: Secondary | ICD-10-CM | POA: Diagnosis not present

## 2021-01-14 DIAGNOSIS — M1990 Unspecified osteoarthritis, unspecified site: Secondary | ICD-10-CM | POA: Diagnosis not present

## 2021-01-15 DIAGNOSIS — T24001D Burn of unspecified degree of unspecified site of right lower limb, except ankle and foot, subsequent encounter: Secondary | ICD-10-CM | POA: Diagnosis not present

## 2021-01-15 DIAGNOSIS — L97129 Non-pressure chronic ulcer of left thigh with unspecified severity: Secondary | ICD-10-CM | POA: Diagnosis not present

## 2021-01-15 DIAGNOSIS — W19XXXA Unspecified fall, initial encounter: Secondary | ICD-10-CM | POA: Diagnosis not present

## 2021-01-15 DIAGNOSIS — I10 Essential (primary) hypertension: Secondary | ICD-10-CM | POA: Diagnosis not present

## 2021-01-15 DIAGNOSIS — T24002D Burn of unspecified degree of unspecified site of left lower limb, except ankle and foot, subsequent encounter: Secondary | ICD-10-CM | POA: Diagnosis not present

## 2021-01-16 ENCOUNTER — Other Ambulatory Visit: Payer: Self-pay

## 2021-01-16 ENCOUNTER — Encounter (HOSPITAL_BASED_OUTPATIENT_CLINIC_OR_DEPARTMENT_OTHER): Payer: Medicare HMO | Attending: Internal Medicine | Admitting: Internal Medicine

## 2021-01-16 DIAGNOSIS — T24232A Burn of second degree of left lower leg, initial encounter: Secondary | ICD-10-CM

## 2021-01-16 DIAGNOSIS — T23209A Burn of second degree of unspecified hand, unspecified site, initial encounter: Secondary | ICD-10-CM | POA: Diagnosis not present

## 2021-01-16 DIAGNOSIS — X12XXXA Contact with other hot fluids, initial encounter: Secondary | ICD-10-CM | POA: Diagnosis not present

## 2021-01-16 DIAGNOSIS — F17200 Nicotine dependence, unspecified, uncomplicated: Secondary | ICD-10-CM | POA: Diagnosis not present

## 2021-01-16 DIAGNOSIS — I10 Essential (primary) hypertension: Secondary | ICD-10-CM | POA: Diagnosis not present

## 2021-01-16 DIAGNOSIS — T24231A Burn of second degree of right lower leg, initial encounter: Secondary | ICD-10-CM | POA: Diagnosis not present

## 2021-01-16 DIAGNOSIS — Z8673 Personal history of transient ischemic attack (TIA), and cerebral infarction without residual deficits: Secondary | ICD-10-CM | POA: Diagnosis not present

## 2021-01-16 NOTE — Progress Notes (Signed)
KACPER, CARTLIDGE ([id number]) Visit Report for 01/16/2021 Abuse/Suicide Risk Screen Details Patient Name: Date of Service: Su Monks ID L. 01/16/2021 9:00 A M Medical Record Number: [id number] Patient Account Number: [id number] Date of Birth/Sex: Treating RN: 03-Apr-1951 (70 y.o. [name] Primary Care Birda Didonato: [name] Other Clinician: Referring Jesiah Yerby: Treating Shivank Pinedo/Extender: Earl Gala Weeks in Treatment: 0 Abuse/Suicide Risk Screen Items Answer ABUSE RISK SCREEN: Has anyone close to you tried to hurt or harm you recentlyo No Do you feel uncomfortable with anyone in your familyo No Has anyone forced you do things that you didnt want to doo No Electronic Signature(s) Signed: 01/16/2021 11:28:33 AM By: Lorrin Jackson Entered By: Lorrin Jackson on 01/16/2021 09:15:59 -------------------------------------------------------------------------------- Activities of Daily Living Details Patient Name: Date of Service: Su Monks ID L. 01/16/2021 9:00 [place] Record Number: [id number] Patient Account Number: [id number] Date of Birth/Sex: Treating RN: 1951/07/14 (70 y.o. [name] Primary Care Davan Nawabi: [name] Other Clinician: Referring Amayrany Cafaro: Treating Willis Holquin/Extender: Earl Gala Weeks in Treatment: 0 Activities of Daily Living Items Answer Activities of Daily Living (Please select one for each item) Drive Automobile Not Able T Medications ake Need Assistance Use T elephone Completely Able Care for Appearance Need Assistance Use T oilet Need Assistance Bath / Shower Need Assistance Dress Self Need Assistance Feed Self Completely Able Walk Need Assistance Get In / Out Bed Need Assistance Housework Need Assistance Prepare Meals Need Assistance Handle Money Need Assistance Shop for Self Need Assistance Electronic Signature(s) Signed: 01/16/2021 11:28:33 AM By: Lorrin Jackson Entered By:  Lorrin Jackson on 01/16/2021 09:16:33 -------------------------------------------------------------------------------- Education Screening Details Patient Name: Date of Service: [name], DA V ID L. 01/16/2021 9:00 A M Medical Record Number: [id number] Patient Account Number: [id number] Date of Birth/Sex: Treating RN: [birth year] (70 y.o. [name] Primary Care Thomasine Klutts: [name] Other Clinician: Referring Odalys Win: Treating Alexi Geibel/Extender: Orvil Feil in Treatment: 0 Primary Learner Assessed: Patient Learning Preferences/Education Level/Primary Language Preferred Language: English Cognitive Barrier Language Barrier: No Translator Needed: No Memory Deficit: No Emotional Barrier: No Cultural/Religious Beliefs Affecting Medical Care: No Physical Barrier Impaired Vision: No Impaired Hearing: No Decreased Hand dexterity: No Knowledge/Comprehension Knowledge Level: Medium Comprehension Level: Medium Ability to understand written instructions: Medium Ability to understand verbal instructions: Medium Motivation Anxiety Level: Calm Cooperation: Cooperative Education Importance: Acknowledges Need Interest in Health Problems: Asks Questions Perception: Coherent Willingness to Engage in Self-Management Medium Activities: Readiness to Engage in Self-Management Medium Activities: Electronic Signature(s) Signed: 01/16/2021 11:28:33 AM By: Lorrin Jackson Entered By: Lorrin Jackson on 01/16/2021 09:17:00 -------------------------------------------------------------------------------- Fall Risk Assessment Details Patient Name: Date of Service: [name], DA V ID L. 01/16/2021 9:00 A M Medical Record Number: [id number] Patient Account Number: [id number] Date of Birth/Sex: Treating RN: 08-17-1950 (70 y.o. [name] Primary Care Tysheka Fanguy: [name] Other Clinician: Referring Solina Heron: Treating Meilyn Heindl/Extender: Earl Gala Weeks in Treatment: 0 Fall Risk Assessment Items Have you had 2 or more falls in the last 12 monthso 0 Yes Have you had any fall that resulted in injury in the last 12 monthso 0 No FALLS RISK SCREEN History of falling - immediate or within 3 months 25 Yes Secondary diagnosis (Do you have 2 or more medical diagnoseso) 0 No Ambulatory aid None/bed rest/wheelchair/nurse 0 No Crutches/cane/walker 15 Yes Furniture 0 No Intravenous therapy Access/Saline/Heparin Lock 0 No Gait/Transferring Normal/ bed rest/ wheelchair 0 No Weak (short steps with or without shuffle, stooped but  able to lift head while walking, may seek 10 Yes support from furniture) Impaired (short steps with shuffle, may have difficulty arising from chair, head down, impaired 0 No balance) Mental Status Oriented to own ability 0 Yes Electronic Signature(s) Signed: 01/16/2021 11:28:33 AM By: Lorrin Jackson Entered By: Lorrin Jackson on 01/16/2021 09:17:38 -------------------------------------------------------------------------------- Foot Assessment Details Patient Name: Date of Service: [name], DA V ID L. 01/16/2021 9:00 [facility] Record Number: [id number] Patient Account Number: [id number] Date of Birth/Sex: Treating RN: 25-Mar-1951 (70 y.o. [name] Primary Care Ulus Hazen: [name] Other Clinician: Referring Chestine Belknap: Treating Makyia Erxleben/Extender: Earl Gala Weeks in Treatment: 0 Foot Assessment Items Site Locations + = Sensation present, - = Sensation absent, C = Callus, U = Ulcer R = Redness, W = Warmth, M = Maceration, PU = Pre-ulcerative lesion F = Fissure, S = Swelling, D = Dryness Assessment Right: Left: Other Deformity: No No Prior Foot Ulcer: No No Prior Amputation: No No Charcot Joint: No No Ambulatory Status: Ambulatory With Help Assistance Device: Wheelchair Gait: Buyer, retail Signature(s) Signed: 01/16/2021 11:28:33 AM By:  Lorrin Jackson Entered By: Lorrin Jackson on 01/16/2021 09:18:24 -------------------------------------------------------------------------------- Nutrition Risk Screening Details Patient Name: Date of Service: [name], DA V ID L. 01/16/2021 9:00 A M Medical Record Number: [id number] Patient Account Number: [id number] Date of Birth/Sex: Treating RN: 04/12/1951 (70 y.o. [name] Primary Care Marelin Tat: [name] Other Clinician: Referring Kohner Orlick: Treating Daiton Cowles/Extender: Earl Gala Weeks in Treatment: 0 Height (in): Weight (lbs): Body Mass Index (BMI): Nutrition Risk Screening Items Score Screening NUTRITION RISK SCREEN: I have an illness or condition that made me change the kind and/or amount of food I eat 0 No I eat fewer than two meals per day 0 No I eat few fruits and vegetables, or milk products 2 Yes I have three or more drinks of beer, liquor or wine almost every day 0 No I have tooth or mouth problems that make it hard for me to eat 0 No I don't always have enough money to buy the food I need 0 No I eat alone most of the time 0 No I take three or more different prescribed or over-the-counter drugs a day 1 Yes Without wanting to, I have lost or gained 10 pounds in the last six months 0 No I am not always physically able to shop, cook and/or feed myself 0 No Nutrition Protocols Good Risk Protocol Moderate Risk Protocol 0 Provide education on nutrition High Risk Proctocol Risk Level: Moderate Risk Score: 3 Electronic Signature(s) Signed: 01/16/2021 11:28:33 AM By: Lorrin Jackson Entered By: Lorrin Jackson on 01/16/2021 09:17:56

## 2021-01-16 NOTE — Progress Notes (Addendum)
DARWIN, ROTHLISBERGER ([id number]) Visit Report for 01/16/2021 Allergy List Details Patient Name: Date of Service: [name] ID L. 01/16/2021 9:00 A M Medical Record Number: [id number] Patient Account Number: [id number] Date of Birth/Sex: Treating RN: 10-30-1950 (70 y.o. [name] Primary Care Rykar Lebleu: [name] Other Clinician: Referring Wenceslao Loper: Treating Judd Mccubbin/Extender: Earl Gala Weeks in Treatment: 0 Allergies Active Allergies No Known Allergies Allergy Notes Electronic Signature(s) Signed: 01/16/2021 11:28:33 AM By: Lorrin Jackson Entered By: Lorrin Jackson on 01/16/2021 09:10:47 -------------------------------------------------------------------------------- Arrival Information Details Patient Name: Date of Service: [name], DA V ID L. 01/16/2021 9:00 A M Medical Record Number: [id number] Patient Account Number: [id number] Date of Birth/Sex: Treating RN: Sep 19, 1950 (70 y.o. [name] Primary Care Khayree Delellis: [name] Other Clinician: Referring Jin Capote: Treating Ailyne Pawley/Extender: Orvil Feil in Treatment: 0 Visit Information Patient Arrived: Wheel Chair Arrival Time: 08:59 Transfer Assistance: Manual Patient Identification Verified: Yes Secondary Verification Process Completed: Yes Patient Requires Transmission-Based Precautions: No Patient Has Alerts: No Electronic Signature(s) Signed: 01/16/2021 11:28:33 AM By: Lorrin Jackson Entered By: Lorrin Jackson on 01/16/2021 09:05:40 -------------------------------------------------------------------------------- Clinic Level of Care Assessment Details Patient Name: Date of Service: [name] ID L. 01/16/2021 9:00 A M Medical Record Number: [id number] Patient Account Number: [id number] Date of Birth/Sex: Treating RN: Jan 23, 1951 (70 y.o. [name] Primary Care Citlally Captain: Other Clinician: [name] Referring Kenedi Cilia: Treating  Nataliyah Packham/Extender: Earl Gala Weeks in Treatment: 0 Clinic Level of Care Assessment Items TOOL 2 Quantity Score X- 1 0 Use when only an EandM is performed on the INITIAL visit ASSESSMENTS - Nursing Assessment / Reassessment X- 1 20 General Physical Exam (combine w/ comprehensive assessment (listed just below) when performed on new pt. evals) X- 1 25 Comprehensive Assessment (HX, ROS, Risk Assessments, Wounds Hx, etc.) ASSESSMENTS - Wound and Skin A ssessment / Reassessment X - Simple Wound Assessment / Reassessment - one wound 1 5 []  - 0 Complex Wound Assessment / Reassessment - multiple wounds X- 1 10 Dermatologic / Skin Assessment (not related to wound area) ASSESSMENTS - Ostomy and/or Continence Assessment and Care []  - 0 Incontinence Assessment and Management []  - 0 Ostomy Care Assessment and Management (repouching, etc.) PROCESS - Coordination of Care X - Simple Patient / Family Education for ongoing care 1 15 []  - 0 Complex (extensive) Patient / Family Education for ongoing care X- 1 10 Staff obtains Programmer, systems, Records, T Results / Process Orders est X- 1 10 Staff telephones HHA, Nursing Homes / Clarify orders / etc []  - 0 Routine Transfer to another Facility (non-emergent condition) []  - 0 Routine Hospital Admission (non-emergent condition) X- 1 15 New Admissions / Biomedical engineer / Ordering NPWT Apligraf, etc. , []  - 0 Emergency Hospital Admission (emergent condition) X- 1 10 Simple Discharge Coordination []  - 0 Complex (extensive) Discharge Coordination PROCESS - Special Needs []  - 0 Pediatric / Minor Patient Management []  - 0 Isolation Patient Management []  - 0 Hearing / Language / Visual special needs []  - 0 Assessment of Community assistance (transportation, D/C planning, etc.) []  - 0 Additional assistance / Altered mentation []  - 0 Support Surface(s) Assessment (bed, cushion, seat, etc.) INTERVENTIONS - Wound  Cleansing / Measurement X- 1 5 Wound Imaging (photographs - any number of wounds) []  - 0 Wound Tracing (instead of photographs) X- 1 5 Simple Wound Measurement - one wound []  - 0 Complex Wound Measurement - multiple wounds X- 1 5 Simple Wound Cleansing - one wound []  -  0 Complex Wound Cleansing - multiple wounds INTERVENTIONS - Wound Dressings X - Small Wound Dressing one or multiple wounds 1 10 []  - 0 Medium Wound Dressing one or multiple wounds []  - 0 Large Wound Dressing one or multiple wounds []  - 0 Application of Medications - injection INTERVENTIONS - Miscellaneous []  - 0 External ear exam []  - 0 Specimen Collection (cultures, biopsies, blood, body fluids, etc.) []  - 0 Specimen(s) / Culture(s) sent or taken to Lab for analysis []  - 0 Patient Transfer (multiple staff / Harrel Lemon Lift / Similar devices) []  - 0 Simple Staple / Suture removal (25 or less) []  - 0 Complex Staple / Suture removal (26 or more) []  - 0 Hypo / Hyperglycemic Management (close monitor of Blood Glucose) []  - 0 Ankle / Brachial Index (ABI) - do not check if billed separately Has the patient been seen at the hospital within the last three years: Yes Total Score: 145 Level Of Care: New/Established - Level 4 Electronic Signature(s) Signed: 01/16/2021 11:33:15 AM By: Rhae Hammock RN Entered By: Rhae Hammock on 01/16/2021 09:22:15 -------------------------------------------------------------------------------- Encounter Discharge Information Details Patient Name: Date of Service: [name], DA V ID L. 01/16/2021 9:00 A M Medical Record Number: [id number] Patient Account Number: [id number] Date of Birth/Sex: Treating RN: 19-Feb-1951 (70 y.o. [name] Primary Care Wake Conlee: [name] Other Clinician: Referring Emmagene Ortner: Treating Nusrat Encarnacion/Extender: Orvil Feil in Treatment: 0 Encounter Discharge Information Items Discharge Condition: Stable Ambulatory  Status: Wheelchair Discharge Destination: [place] Telephoned: No Orders Sent: Yes Transportation: Other Accompanied By: alone Schedule Follow-up Appointment: No Clinical Summary of Care: Patient Declined Electronic Signature(s) Signed: 01/16/2021 11:28:33 AM By: Lorrin Jackson Entered By: Lorrin Jackson on 01/16/2021 09:36:30 -------------------------------------------------------------------------------- Lower Extremity Assessment Details Patient Name: Date of Service: [name], DA V ID L. 01/16/2021 9:00 A M Medical Record Number: [id number] Patient Account Number: [id number] Date of Birth/Sex: Treating RN: 1950/09/14 (70 y.o. [name] Primary Care Maelys Kinnick: [name] Other Clinician: Referring Saman Giddens: Treating Akisha Sturgill/Extender: Earl Gala Weeks in Treatment: 0 Electronic Signature(s) Signed: 01/16/2021 11:28:33 AM By: Lorrin Jackson Entered By: Lorrin Jackson on 01/16/2021 09:18:32 -------------------------------------------------------------------------------- Multi Wound Chart Details Patient Name: Date of Service: [name], DA V ID L. 01/16/2021 9:00 A M Medical Record Number: [id number] Patient Account Number: [id number] Date of Birth/Sex: Treating RN: 12/21/1950 (70 y.o. [name] Primary Care Krishna Dancel: [name] Other Clinician: Referring Carla Rashad: Treating Apoorva Bugay/Extender: Earl Gala Weeks in Treatment: 0 Vital Signs Height(in): Pulse(bpm): 80 Weight(lbs): Blood Pressure(mmHg): 122/67 Body Mass Index(BMI): Temperature(F): 98.2 Respiratory Rate(breaths/min): 18 Photos: [N/A:N/A] Left Upper Leg N/A N/A Wound Location: Thermal Burn N/A N/A Wounding Event: 2nd degree Burn N/A N/A Primary Etiology: Glaucoma, Anemia, Coronary Artery N/A N/A Comorbid History: Disease, Hypotension, Hepatitis C, History of Burn, Gout, Osteoarthritis 01/13/2021 N/A N/A Date Acquired: 0 N/A  N/A Weeks of Treatment: Open N/A N/A Wound Status: 17x17x0.1 N/A N/A Measurements L x W x D (cm) 226.98 N/A N/A A (cm) : rea 22.698 N/A N/A Volume (cm) : 0.00% N/A N/A % Reduction in Area: 0.00% N/A N/A % Reduction in Volume: Full Thickness Without Exposed N/A N/A Classification: Support Structures Medium N/A N/A Exudate Amount: Serosanguineous N/A N/A Exudate Type: red, brown N/A N/A Exudate Color: Distinct, outline attached N/A N/A Wound Margin: Large (67-100%) N/A N/A Granulation Amount: Red, Pink N/A N/A Granulation Quality: None Present (0%) N/A N/A Necrotic Amount: Fascia: No N/A N/A Exposed Structures: Fat Layer (Subcutaneous Tissue): No  Tendon: No Muscle: No Joint: No Bone: No Large (67-100%) N/A N/A Epithelialization: Treatment Notes Wound #1 (Upper Leg) Wound Laterality: Left Cleanser Soap and Water Discharge Instruction: May shower and wash wound with dial antibacterial soap and water prior to dressing change. Wound Cleanser Discharge Instruction: Cleanse the wound with wound cleanser prior to applying a clean dressing using gauze sponges, not tissue or cotton balls. Peri-Wound Care Topical Vaseline White Petroleum Jelly, 1 (oz) Discharge Instruction: apply to left upper leg burn area Primary Dressing Xeroform Occlusive Gauze Dressing, 4x4 in Discharge Instruction: Apply to wound bed as instructed Secondary Dressing ABD Pad, 5x9 Discharge Instruction: Apply over primary dressing as directed. Secured With [employer], 4.5x3.1 (in/yd) Discharge Instruction: Secure with Kerlix as directed. 58M Medipore H Soft Cloth Surgical T 4 x 2 (in/yd) ape Discharge Instruction: Secure dressing with tape as directed. Compression Wrap Compression Stockings Add-Ons Electronic Signature(s) Signed: 01/16/2021 12:25:29 PM By: Kalman Shan DO Signed: 01/20/2021 7:08:24 PM By: Lorrin Jackson Entered By: Kalman Shan on 01/16/2021  12:11:22 -------------------------------------------------------------------------------- Multi-Disciplinary Care Plan Details Patient Name: Date of Service: [name], DA V ID L. 01/16/2021 9:00 A M Medical Record Number: [id number] Patient Account Number: [id number] Date of Birth/Sex: Treating RN: 09-27-1950 (70 y.o. [name], [name] Primary Care Aeriana Speece: [name] Other Clinician: Referring Nykole Matos: Treating Conya Ellinwood/Extender: Earl Gala Weeks in Treatment: 0 Active Inactive Electronic Signature(s) Signed: 04/16/2021 3:07:17 PM By: Baruch Gouty RN, BSN Signed: 08/06/2021 12:40:52 PM By: Rhae Hammock RN Previous Signature: 01/16/2021 11:33:15 AM Version By: Rhae Hammock RN Entered By: Baruch Gouty on 03/12/2021 12:58:24 -------------------------------------------------------------------------------- Pain Assessment Details Patient Name: Date of Service: [name], DA V ID L. 01/16/2021 9:00 A M Medical Record Number: [id number] Patient Account Number: [id number] Date of Birth/Sex: Treating RN: 10-Aug-1950 (70 y.o. [name] Primary Care Sharrell Krawiec: [name] Other Clinician: Referring Gerhart Ruggieri: Treating Argie Applegate/Extender: Earl Gala Weeks in Treatment: 0 Active Problems Location of Pain Severity and Description of Pain Patient Has Paino Yes Site Locations Pain Location: Pain in Ulcers With Dressing Change: Yes Rate the pain. Current Pain Level: 8 Character of Pain Describe the Pain: Burning, Throbbing Pain Management and Medication Current Pain Management: Medication: Yes Cold Application: No Rest: No Massage: No Activity: No T.E.N.S.: No Heat Application: No Leg drop or elevation: No Is the Current Pain Management Adequate: Inadequate How does your wound impact your activities of daily livingo Sleep: Yes Bathing: No Appetite: No Relationship With Others: No Bladder Continence:  No Emotions: No Bowel Continence: No Work: No Toileting: No Drive: No Dressing: No Hobbies: No Electronic Signature(s) Signed: 01/16/2021 11:28:33 AM By: Lorrin Jackson Entered By: Lorrin Jackson on 01/16/2021 09:19:08 -------------------------------------------------------------------------------- Patient/Caregiver Education Details Patient Name: Date of Service: [name], Shaune Pascal ID L. 7/1/2022andnbsp9:00 A M Medical Record Number: [id number] Patient Account Number: [id number] Date of Birth/Gender: Treating RN: [birth year] (70 y.o. [name] Primary Care Physician: [name] Other Clinician: Referring Physician: Treating Physician/Extender: Orvil Feil in Treatment: 0 Education Assessment Education Provided To: Patient Education Topics Provided Welcome T [facility]: o Methods: Explain/Verbal Responses: State content correctly Electronic Signature(s) Signed: 01/16/2021 11:33:15 AM By: Rhae Hammock RN Entered By: Rhae Hammock on 01/16/2021 09:21:29 -------------------------------------------------------------------------------- Wound Assessment Details Patient Name: Date of Service: [name], DA V ID L. 01/16/2021 9:00 [place] Record Number: [id number] Patient Account Number: [id number] Date of Birth/Sex: Treating RN: 08-13-1950 (70 y.o. [name], [name] Primary Care Amerika Nourse: [name] Other  Clinician: Referring Natha Guin: Treating Amaal Dimartino/Extender: Earl Gala Weeks in Treatment: 0 Wound Status Wound Number: 1 Primary 2nd degree Burn Etiology: Wound Location: Left Upper Leg Wound Open Wounding Event: Thermal Burn Status: Date Acquired: 01/13/2021 Comorbid Glaucoma, Anemia, Coronary Artery Disease, Hypotension, Weeks Of Treatment: 0 History: Hepatitis C, History of Burn, Gout, Osteoarthritis Clustered Wound: No Photos Wound Measurements Length: (cm) 17 Width: (cm)  17 Depth: (cm) 0.1 Area: (cm) 226.98 Volume: (cm) 22.698 % Reduction in Area: 0% % Reduction in Volume: 0% Epithelialization: Large (67-100%) Tunneling: No Undermining: No Wound Description Classification: Full Thickness Without Exposed Support Structures Wound Margin: Distinct, outline attached Exudate Amount: Medium Exudate Type: Serosanguineous Exudate Color: red, brown Foul Odor After Cleansing: No Slough/Fibrino No Wound Bed Granulation Amount: Large (67-100%) Exposed Structure Granulation Quality: Red, Pink Fascia Exposed: No Necrotic Amount: None Present (0%) Fat Layer (Subcutaneous Tissue) Exposed: No Tendon Exposed: No Muscle Exposed: No Joint Exposed: No Bone Exposed: No Electronic Signature(s) Signed: 01/16/2021 11:35:39 AM By: Sandre Kitty Signed: 08/06/2021 12:40:52 PM By: Rhae Hammock RN Previous Signature: 01/16/2021 11:33:15 AM Version By: Rhae Hammock RN Entered By: Sandre Kitty on 01/16/2021 11:33:35 -------------------------------------------------------------------------------- Vitals Details Patient Name: Date of Service: [name], DA V ID L. 01/16/2021 9:00 A M Medical Record Number: [id number] Patient Account Number: [id number] Date of Birth/Sex: Treating RN: Nov 08, 1950 (70 y.o. [name] Primary Care Ellason Segar: [name] Other Clinician: Referring Lahari Suttles: Treating Kinnley Paulson/Extender: Earl Gala Weeks in Treatment: 0 Vital Signs Time Taken: 09:05 Temperature (F): 98.2 Pulse (bpm): 80 Respiratory Rate (breaths/min): 18 Blood Pressure (mmHg): 122/67 Reference Range: 80 - 120 mg / dl Electronic Signature(s) Signed: 01/16/2021 11:28:33 AM By: Lorrin Jackson Entered By: Lorrin Jackson on 01/16/2021 09:10:22
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
## 2021-01-23 ENCOUNTER — Other Ambulatory Visit: Payer: Self-pay | Admitting: Family Medicine

## 2021-01-26 DIAGNOSIS — T24011D Burn of unspecified degree of right thigh, subsequent encounter: Secondary | ICD-10-CM | POA: Diagnosis not present

## 2021-01-26 DIAGNOSIS — T24012D Burn of unspecified degree of left thigh, subsequent encounter: Secondary | ICD-10-CM | POA: Diagnosis not present

## 2021-01-26 DIAGNOSIS — T310 Burns involving less than 10% of body surface: Secondary | ICD-10-CM | POA: Diagnosis not present

## 2021-01-26 DIAGNOSIS — T24291D Burn of second degree of multiple sites of right lower limb, except ankle and foot, subsequent encounter: Secondary | ICD-10-CM | POA: Diagnosis not present

## 2021-01-26 DIAGNOSIS — T24292D Burn of second degree of multiple sites of left lower limb, except ankle and foot, subsequent encounter: Secondary | ICD-10-CM | POA: Diagnosis not present

## 2021-01-26 DIAGNOSIS — T23201D Burn of second degree of right hand, unspecified site, subsequent encounter: Secondary | ICD-10-CM | POA: Diagnosis not present

## 2021-01-26 DIAGNOSIS — Z888 Allergy status to other drugs, medicaments and biological substances status: Secondary | ICD-10-CM | POA: Diagnosis not present

## 2021-01-26 DIAGNOSIS — Z791 Long term (current) use of non-steroidal anti-inflammatories (NSAID): Secondary | ICD-10-CM | POA: Diagnosis not present

## 2021-01-26 DIAGNOSIS — Z79899 Other long term (current) drug therapy: Secondary | ICD-10-CM | POA: Diagnosis not present

## 2021-01-26 DIAGNOSIS — Z880 Allergy status to penicillin: Secondary | ICD-10-CM | POA: Diagnosis not present

## 2021-01-29 ENCOUNTER — Other Ambulatory Visit: Payer: Self-pay | Admitting: Family Medicine

## 2021-01-30 ENCOUNTER — Telehealth: Payer: Self-pay | Admitting: Family Medicine

## 2021-01-30 ENCOUNTER — Encounter (HOSPITAL_BASED_OUTPATIENT_CLINIC_OR_DEPARTMENT_OTHER): Payer: Medicare HMO | Admitting: Internal Medicine

## 2021-01-30 ENCOUNTER — Other Ambulatory Visit: Payer: Self-pay

## 2021-01-30 MED ORDER — SERTRALINE HCL 100 MG PO TABS: 100.0000 mg | ORAL_TABLET | Freq: Every day | ORAL | 6 refills | Status: DC

## 2021-01-30 NOTE — Telephone Encounter (Signed)
Noted  

## 2021-01-30 NOTE — Telephone Encounter (Signed)
The pharmacy called to see which provider is supposed to receive the refill request for   lisinopril (ZESTRIL) 40 MG tablet   QUEtiapine (SEROQUEL) 200 MG tablet   [place], [place] Phone:  [phone]  Fax:  [phone]

## 2021-01-30 NOTE — Telephone Encounter (Signed)
Patient called wanting to know why Dr. Volanda Napoleon refused his medication refill for QUEtiapine (SEROQUEL) 200 MG tablet I advised the patient that Dr. Alger Simons is the provider that prescribed him the medication and he will need to contact his office. He was unable to write the number so I transferred him to their office.

## 2021-01-30 NOTE — Telephone Encounter (Signed)
Early Chars. [name] is out Rx Seroquel 200 MG. His PCP has advised him to call you for refills.   Patient agreed to schedule and keep an appointment. Will you send in a refill? (Appointment is on 04/29/2021).

## 2021-01-31 DIAGNOSIS — M109 Gout, unspecified: Secondary | ICD-10-CM | POA: Diagnosis not present

## 2021-01-31 DIAGNOSIS — M1612 Unilateral primary osteoarthritis, left hip: Secondary | ICD-10-CM | POA: Diagnosis not present

## 2021-01-31 DIAGNOSIS — H409 Unspecified glaucoma: Secondary | ICD-10-CM | POA: Diagnosis not present

## 2021-01-31 DIAGNOSIS — F329 Major depressive disorder, single episode, unspecified: Secondary | ICD-10-CM | POA: Diagnosis not present

## 2021-01-31 DIAGNOSIS — T23202D Burn of second degree of left hand, unspecified site, subsequent encounter: Secondary | ICD-10-CM | POA: Diagnosis not present

## 2021-01-31 DIAGNOSIS — I10 Essential (primary) hypertension: Secondary | ICD-10-CM | POA: Diagnosis not present

## 2021-01-31 DIAGNOSIS — T24211D Burn of second degree of right thigh, subsequent encounter: Secondary | ICD-10-CM | POA: Diagnosis not present

## 2021-01-31 DIAGNOSIS — Q2733 Arteriovenous malformation of digestive system vessel: Secondary | ICD-10-CM | POA: Diagnosis not present

## 2021-01-31 DIAGNOSIS — T24212D Burn of second degree of left thigh, subsequent encounter: Secondary | ICD-10-CM | POA: Diagnosis not present

## 2021-02-02 ENCOUNTER — Ambulatory Visit: Payer: Medicare HMO | Admitting: Family Medicine

## 2021-02-03 ENCOUNTER — Other Ambulatory Visit: Payer: Self-pay

## 2021-02-03 ENCOUNTER — Ambulatory Visit (INDEPENDENT_AMBULATORY_CARE_PROVIDER_SITE_OTHER): Payer: Medicare HMO | Admitting: Otolaryngology

## 2021-02-03 ENCOUNTER — Other Ambulatory Visit: Payer: Self-pay | Admitting: Family Medicine

## 2021-02-03 DIAGNOSIS — F329 Major depressive disorder, single episode, unspecified: Secondary | ICD-10-CM | POA: Diagnosis not present

## 2021-02-03 DIAGNOSIS — M109 Gout, unspecified: Secondary | ICD-10-CM | POA: Diagnosis not present

## 2021-02-03 DIAGNOSIS — M1612 Unilateral primary osteoarthritis, left hip: Secondary | ICD-10-CM | POA: Diagnosis not present

## 2021-02-03 DIAGNOSIS — I10 Essential (primary) hypertension: Secondary | ICD-10-CM | POA: Diagnosis not present

## 2021-02-03 DIAGNOSIS — Q2733 Arteriovenous malformation of digestive system vessel: Secondary | ICD-10-CM | POA: Diagnosis not present

## 2021-02-03 DIAGNOSIS — T23202D Burn of second degree of left hand, unspecified site, subsequent encounter: Secondary | ICD-10-CM | POA: Diagnosis not present

## 2021-02-03 DIAGNOSIS — H409 Unspecified glaucoma: Secondary | ICD-10-CM | POA: Diagnosis not present

## 2021-02-03 DIAGNOSIS — T24211D Burn of second degree of right thigh, subsequent encounter: Secondary | ICD-10-CM | POA: Diagnosis not present

## 2021-02-03 DIAGNOSIS — T24212D Burn of second degree of left thigh, subsequent encounter: Secondary | ICD-10-CM | POA: Diagnosis not present

## 2021-02-03 NOTE — Telephone Encounter (Signed)
Patient has an appointment with pcp tomorrow, this will be discussed then.

## 2021-02-04 ENCOUNTER — Ambulatory Visit (INDEPENDENT_AMBULATORY_CARE_PROVIDER_SITE_OTHER): Payer: Medicare HMO | Admitting: Family Medicine

## 2021-02-04 ENCOUNTER — Encounter: Payer: Self-pay | Admitting: Family Medicine

## 2021-02-04 VITALS — BP 130/74 | HR 85 | Temp 98.8°F

## 2021-02-04 DIAGNOSIS — B351 Tinea unguium: Secondary | ICD-10-CM | POA: Insufficient documentation

## 2021-02-04 DIAGNOSIS — R600 Localized edema: Secondary | ICD-10-CM

## 2021-02-04 DIAGNOSIS — I10 Essential (primary) hypertension: Secondary | ICD-10-CM | POA: Diagnosis not present

## 2021-02-04 DIAGNOSIS — Z8673 Personal history of transient ischemic attack (TIA), and cerebral infarction without residual deficits: Secondary | ICD-10-CM | POA: Diagnosis not present

## 2021-02-04 DIAGNOSIS — T310 Burns involving less than 10% of body surface: Secondary | ICD-10-CM

## 2021-02-04 DIAGNOSIS — I69151 Hemiplegia and hemiparesis following nontraumatic intracerebral hemorrhage affecting right dominant side: Secondary | ICD-10-CM | POA: Diagnosis not present

## 2021-02-04 DIAGNOSIS — F17210 Nicotine dependence, cigarettes, uncomplicated: Secondary | ICD-10-CM

## 2021-02-04 DIAGNOSIS — Z720 Tobacco use: Secondary | ICD-10-CM | POA: Diagnosis not present

## 2021-02-04 DIAGNOSIS — R2689 Other abnormalities of gait and mobility: Secondary | ICD-10-CM | POA: Diagnosis not present

## 2021-02-04 LAB — CBC WITH DIFFERENTIAL/PLATELET
Basophils Absolute: 0 10*3/uL (ref 0.0–0.1)
Basophils Relative: 0.4 % (ref 0.0–3.0)
Eosinophils Absolute: 0.2 10*3/uL (ref 0.0–0.7)
Eosinophils Relative: 2.2 % (ref 0.0–5.0)
HCT: 33.2 % — ABNORMAL LOW (ref 39.0–52.0)
Hemoglobin: 10.6 g/dL — ABNORMAL LOW (ref 13.0–17.0)
Lymphocytes Relative: 15 % (ref 12.0–46.0)
Lymphs Abs: 1 10*3/uL (ref 0.7–4.0)
MCHC: 31.9 g/dL (ref 30.0–36.0)
MCV: 81.6 fl (ref 78.0–100.0)
Monocytes Absolute: 0.5 10*3/uL (ref 0.1–1.0)
Monocytes Relative: 6.7 % (ref 3.0–12.0)
Neutro Abs: 5.2 10*3/uL (ref 1.4–7.7)
Neutrophils Relative %: 75.7 % (ref 43.0–77.0)
Platelets: 353 10*3/uL (ref 150.0–400.0)
RBC: 4.07 Mil/uL — ABNORMAL LOW (ref 4.22–5.81)
RDW: 23.2 % — ABNORMAL HIGH (ref 11.5–15.5)
WBC: 6.8 10*3/uL (ref 4.0–10.5)

## 2021-02-04 LAB — BASIC METABOLIC PANEL
BUN: 20 mg/dL (ref 6–23)
CO2: 24 mEq/L (ref 19–32)
Calcium: 9.1 mg/dL (ref 8.4–10.5)
Chloride: 107 mEq/L (ref 96–112)
Creatinine, Ser: 1.04 mg/dL (ref 0.40–1.50)
GFR: 72.05 mL/min (ref >60.00)
Glucose, Bld: 86 mg/dL (ref 70–99)
Potassium: 3.8 mEq/L (ref 3.5–5.1)
Sodium: 142 mEq/L (ref 135–145)

## 2021-02-04 LAB — BRAIN NATRIURETIC PEPTIDE: Pro B Natriuretic peptide (BNP): 61 pg/mL (ref 0.0–100.0)

## 2021-02-04 NOTE — Patient Instructions (Signed)
Your blood pressure has been controlled on chlorthalidone 25 mg daily and hydralazine 25 mg 3 times a day.  It is important that you continue to decrease the amount of salt you are eating.  The outpatient rehab center has been trying to call you in regards to setting up an appointment.
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
## 2021-02-05 DIAGNOSIS — M109 Gout, unspecified: Secondary | ICD-10-CM | POA: Diagnosis not present

## 2021-02-05 DIAGNOSIS — Q2733 Arteriovenous malformation of digestive system vessel: Secondary | ICD-10-CM | POA: Diagnosis not present

## 2021-02-05 DIAGNOSIS — F329 Major depressive disorder, single episode, unspecified: Secondary | ICD-10-CM | POA: Diagnosis not present

## 2021-02-05 DIAGNOSIS — M1612 Unilateral primary osteoarthritis, left hip: Secondary | ICD-10-CM | POA: Diagnosis not present

## 2021-02-05 DIAGNOSIS — T24211D Burn of second degree of right thigh, subsequent encounter: Secondary | ICD-10-CM | POA: Diagnosis not present

## 2021-02-05 DIAGNOSIS — T23202D Burn of second degree of left hand, unspecified site, subsequent encounter: Secondary | ICD-10-CM | POA: Diagnosis not present

## 2021-02-05 DIAGNOSIS — I10 Essential (primary) hypertension: Secondary | ICD-10-CM | POA: Diagnosis not present

## 2021-02-05 DIAGNOSIS — T24212D Burn of second degree of left thigh, subsequent encounter: Secondary | ICD-10-CM | POA: Diagnosis not present

## 2021-02-05 DIAGNOSIS — H409 Unspecified glaucoma: Secondary | ICD-10-CM | POA: Diagnosis not present

## 2021-02-05 LAB — D-DIMER, QUANTITATIVE: D-Dimer, Quant: 0.46 mcg/mL FEU (ref <0.50)

## 2021-02-09 DIAGNOSIS — Q2733 Arteriovenous malformation of digestive system vessel: Secondary | ICD-10-CM | POA: Diagnosis not present

## 2021-02-09 DIAGNOSIS — T24212D Burn of second degree of left thigh, subsequent encounter: Secondary | ICD-10-CM | POA: Diagnosis not present

## 2021-02-09 DIAGNOSIS — F329 Major depressive disorder, single episode, unspecified: Secondary | ICD-10-CM | POA: Diagnosis not present

## 2021-02-09 DIAGNOSIS — M1612 Unilateral primary osteoarthritis, left hip: Secondary | ICD-10-CM | POA: Diagnosis not present

## 2021-02-09 DIAGNOSIS — M109 Gout, unspecified: Secondary | ICD-10-CM | POA: Diagnosis not present

## 2021-02-09 DIAGNOSIS — T24211D Burn of second degree of right thigh, subsequent encounter: Secondary | ICD-10-CM | POA: Diagnosis not present

## 2021-02-09 DIAGNOSIS — H409 Unspecified glaucoma: Secondary | ICD-10-CM | POA: Diagnosis not present

## 2021-02-09 DIAGNOSIS — T23202D Burn of second degree of left hand, unspecified site, subsequent encounter: Secondary | ICD-10-CM | POA: Diagnosis not present

## 2021-02-09 DIAGNOSIS — I10 Essential (primary) hypertension: Secondary | ICD-10-CM | POA: Diagnosis not present

## 2021-02-11 DIAGNOSIS — F329 Major depressive disorder, single episode, unspecified: Secondary | ICD-10-CM | POA: Diagnosis not present

## 2021-02-11 DIAGNOSIS — Q2733 Arteriovenous malformation of digestive system vessel: Secondary | ICD-10-CM | POA: Diagnosis not present

## 2021-02-11 DIAGNOSIS — T24211D Burn of second degree of right thigh, subsequent encounter: Secondary | ICD-10-CM | POA: Diagnosis not present

## 2021-02-11 DIAGNOSIS — I10 Essential (primary) hypertension: Secondary | ICD-10-CM | POA: Diagnosis not present

## 2021-02-11 DIAGNOSIS — M109 Gout, unspecified: Secondary | ICD-10-CM | POA: Diagnosis not present

## 2021-02-11 DIAGNOSIS — T24212D Burn of second degree of left thigh, subsequent encounter: Secondary | ICD-10-CM | POA: Diagnosis not present

## 2021-02-11 DIAGNOSIS — M1612 Unilateral primary osteoarthritis, left hip: Secondary | ICD-10-CM | POA: Diagnosis not present

## 2021-02-11 DIAGNOSIS — H409 Unspecified glaucoma: Secondary | ICD-10-CM | POA: Diagnosis not present

## 2021-02-11 DIAGNOSIS — T23202D Burn of second degree of left hand, unspecified site, subsequent encounter: Secondary | ICD-10-CM | POA: Diagnosis not present

## 2021-02-16 DIAGNOSIS — T24211D Burn of second degree of right thigh, subsequent encounter: Secondary | ICD-10-CM | POA: Diagnosis not present

## 2021-02-16 DIAGNOSIS — Q2733 Arteriovenous malformation of digestive system vessel: Secondary | ICD-10-CM | POA: Diagnosis not present

## 2021-02-16 DIAGNOSIS — I10 Essential (primary) hypertension: Secondary | ICD-10-CM | POA: Diagnosis not present

## 2021-02-16 DIAGNOSIS — F329 Major depressive disorder, single episode, unspecified: Secondary | ICD-10-CM | POA: Diagnosis not present

## 2021-02-16 DIAGNOSIS — M109 Gout, unspecified: Secondary | ICD-10-CM | POA: Diagnosis not present

## 2021-02-16 DIAGNOSIS — T24212D Burn of second degree of left thigh, subsequent encounter: Secondary | ICD-10-CM | POA: Diagnosis not present

## 2021-02-16 DIAGNOSIS — H409 Unspecified glaucoma: Secondary | ICD-10-CM | POA: Diagnosis not present

## 2021-02-16 DIAGNOSIS — M1612 Unilateral primary osteoarthritis, left hip: Secondary | ICD-10-CM | POA: Diagnosis not present

## 2021-02-16 DIAGNOSIS — T23202D Burn of second degree of left hand, unspecified site, subsequent encounter: Secondary | ICD-10-CM | POA: Diagnosis not present

## 2021-02-18 DIAGNOSIS — M109 Gout, unspecified: Secondary | ICD-10-CM | POA: Diagnosis not present

## 2021-02-18 DIAGNOSIS — M1612 Unilateral primary osteoarthritis, left hip: Secondary | ICD-10-CM | POA: Diagnosis not present

## 2021-02-18 DIAGNOSIS — T24211D Burn of second degree of right thigh, subsequent encounter: Secondary | ICD-10-CM | POA: Diagnosis not present

## 2021-02-18 DIAGNOSIS — T23202D Burn of second degree of left hand, unspecified site, subsequent encounter: Secondary | ICD-10-CM | POA: Diagnosis not present

## 2021-02-18 DIAGNOSIS — F329 Major depressive disorder, single episode, unspecified: Secondary | ICD-10-CM | POA: Diagnosis not present

## 2021-02-18 DIAGNOSIS — I10 Essential (primary) hypertension: Secondary | ICD-10-CM | POA: Diagnosis not present

## 2021-02-18 DIAGNOSIS — T24212D Burn of second degree of left thigh, subsequent encounter: Secondary | ICD-10-CM | POA: Diagnosis not present

## 2021-02-18 DIAGNOSIS — Q2733 Arteriovenous malformation of digestive system vessel: Secondary | ICD-10-CM | POA: Diagnosis not present

## 2021-02-18 DIAGNOSIS — H409 Unspecified glaucoma: Secondary | ICD-10-CM | POA: Diagnosis not present

## 2021-02-19 ENCOUNTER — Other Ambulatory Visit: Payer: Self-pay | Admitting: Family Medicine

## 2021-02-19 DIAGNOSIS — I619 Nontraumatic intracerebral hemorrhage, unspecified: Secondary | ICD-10-CM

## 2021-02-19 DIAGNOSIS — I10 Essential (primary) hypertension: Secondary | ICD-10-CM

## 2021-02-19 DIAGNOSIS — F419 Anxiety disorder, unspecified: Secondary | ICD-10-CM

## 2021-02-24 ENCOUNTER — Telehealth: Payer: Self-pay | Admitting: Family Medicine

## 2021-02-24 DIAGNOSIS — I720 Aneurysm of carotid artery: Secondary | ICD-10-CM | POA: Insufficient documentation

## 2021-02-24 DIAGNOSIS — I69151 Hemiplegia and hemiparesis following nontraumatic intracerebral hemorrhage affecting right dominant side: Secondary | ICD-10-CM | POA: Insufficient documentation

## 2021-02-24 NOTE — Telephone Encounter (Signed)
[name] from [place] has been informed that the pt's Cyclobenzaprine '5mg'$  was not included in approved rx because this has to be sent to PCP for approval.

## 2021-02-24 NOTE — Telephone Encounter (Signed)
Almyra Free from [place] call and need a call back on pt medication  at [phone].

## 2021-02-26 DIAGNOSIS — M1612 Unilateral primary osteoarthritis, left hip: Secondary | ICD-10-CM | POA: Diagnosis not present

## 2021-02-26 DIAGNOSIS — T23202D Burn of second degree of left hand, unspecified site, subsequent encounter: Secondary | ICD-10-CM | POA: Diagnosis not present

## 2021-02-26 DIAGNOSIS — Q2733 Arteriovenous malformation of digestive system vessel: Secondary | ICD-10-CM | POA: Diagnosis not present

## 2021-02-26 DIAGNOSIS — H409 Unspecified glaucoma: Secondary | ICD-10-CM | POA: Diagnosis not present

## 2021-02-26 DIAGNOSIS — M109 Gout, unspecified: Secondary | ICD-10-CM | POA: Diagnosis not present

## 2021-02-26 DIAGNOSIS — T24212D Burn of second degree of left thigh, subsequent encounter: Secondary | ICD-10-CM | POA: Diagnosis not present

## 2021-02-26 DIAGNOSIS — F329 Major depressive disorder, single episode, unspecified: Secondary | ICD-10-CM | POA: Diagnosis not present

## 2021-02-26 DIAGNOSIS — T24211D Burn of second degree of right thigh, subsequent encounter: Secondary | ICD-10-CM | POA: Diagnosis not present

## 2021-02-26 DIAGNOSIS — I10 Essential (primary) hypertension: Secondary | ICD-10-CM | POA: Diagnosis not present

## 2021-02-27 DIAGNOSIS — M1612 Unilateral primary osteoarthritis, left hip: Secondary | ICD-10-CM | POA: Diagnosis not present

## 2021-02-27 DIAGNOSIS — Q2733 Arteriovenous malformation of digestive system vessel: Secondary | ICD-10-CM | POA: Diagnosis not present

## 2021-02-27 DIAGNOSIS — T24211D Burn of second degree of right thigh, subsequent encounter: Secondary | ICD-10-CM | POA: Diagnosis not present

## 2021-02-27 DIAGNOSIS — T23202D Burn of second degree of left hand, unspecified site, subsequent encounter: Secondary | ICD-10-CM | POA: Diagnosis not present

## 2021-02-27 DIAGNOSIS — H409 Unspecified glaucoma: Secondary | ICD-10-CM | POA: Diagnosis not present

## 2021-02-27 DIAGNOSIS — I10 Essential (primary) hypertension: Secondary | ICD-10-CM | POA: Diagnosis not present

## 2021-02-27 DIAGNOSIS — T24212D Burn of second degree of left thigh, subsequent encounter: Secondary | ICD-10-CM | POA: Diagnosis not present

## 2021-02-27 DIAGNOSIS — F329 Major depressive disorder, single episode, unspecified: Secondary | ICD-10-CM | POA: Diagnosis not present

## 2021-02-27 DIAGNOSIS — M109 Gout, unspecified: Secondary | ICD-10-CM | POA: Diagnosis not present

## 2021-03-02 ENCOUNTER — Ambulatory Visit: Payer: Medicare HMO | Admitting: Podiatry

## 2021-03-02 DIAGNOSIS — M1612 Unilateral primary osteoarthritis, left hip: Secondary | ICD-10-CM | POA: Diagnosis not present

## 2021-03-02 DIAGNOSIS — M109 Gout, unspecified: Secondary | ICD-10-CM | POA: Diagnosis not present

## 2021-03-02 DIAGNOSIS — Q2733 Arteriovenous malformation of digestive system vessel: Secondary | ICD-10-CM | POA: Diagnosis not present

## 2021-03-02 DIAGNOSIS — I10 Essential (primary) hypertension: Secondary | ICD-10-CM | POA: Diagnosis not present

## 2021-03-02 DIAGNOSIS — T23202D Burn of second degree of left hand, unspecified site, subsequent encounter: Secondary | ICD-10-CM | POA: Diagnosis not present

## 2021-03-02 DIAGNOSIS — T24211D Burn of second degree of right thigh, subsequent encounter: Secondary | ICD-10-CM | POA: Diagnosis not present

## 2021-03-02 DIAGNOSIS — T24212D Burn of second degree of left thigh, subsequent encounter: Secondary | ICD-10-CM | POA: Diagnosis not present

## 2021-03-02 DIAGNOSIS — H409 Unspecified glaucoma: Secondary | ICD-10-CM | POA: Diagnosis not present

## 2021-03-02 DIAGNOSIS — F329 Major depressive disorder, single episode, unspecified: Secondary | ICD-10-CM | POA: Diagnosis not present

## 2021-03-04 ENCOUNTER — Ambulatory Visit (INDEPENDENT_AMBULATORY_CARE_PROVIDER_SITE_OTHER): Payer: Medicare HMO | Admitting: Podiatry

## 2021-03-04 ENCOUNTER — Other Ambulatory Visit: Payer: Self-pay

## 2021-03-04 DIAGNOSIS — M79675 Pain in left toe(s): Secondary | ICD-10-CM

## 2021-03-04 DIAGNOSIS — B351 Tinea unguium: Secondary | ICD-10-CM

## 2021-03-04 DIAGNOSIS — M79674 Pain in right toe(s): Secondary | ICD-10-CM | POA: Diagnosis not present

## 2021-03-04 NOTE — Progress Notes (Signed)
   SUBJECTIVE Patient presents to office today complaining of elongated, thickened nails that cause pain while ambulating in shoes.  Patient is unable to trim their own nails. Patient is here for further evaluation and treatment.  Past Medical History:  Diagnosis Date   Arthritis    [name] ulcer, chronic 01/11/2018   Chronic back pain    GERD (gastroesophageal reflux disease)    uses Baking Soda   GIB (gastrointestinal bleeding) 2012   Glaucoma    right eye   Headache    occasionally   Hepatitis C    Hiatal hernia with gastroesophageal reflux disease and esophagitis 01/05/2018   History of blood transfusion    no abnormal reaction noted   History of gout    doesn't take any meds   Hyperlipidemia    not on any meds   Insomnia    takes Ambien nightly   Ischemic colitis ([place]) 2012   Joint pain    Nocturia    Numbness    both legs occasionally   PONV (postoperative nausea and vomiting)    Prostate cancer (HCC)    Shortness of breath dyspnea    rarely but when notices he can be lying/sitting/exertion.Dr.Hochrein is aware per pt   Stroke [facility]) 2016    Urinary frequency    Urinary urgency     OBJECTIVE General Patient is awake, alert, and oriented x 3 and in no acute distress. Derm Skin is dry and supple bilateral. Negative open lesions or macerations. Remaining integument unremarkable. Nails are tender, long, thickened and dystrophic with subungual debris, consistent with onychomycosis, 1-5 bilateral. No signs of infection noted. Vasc  DP and PT pedal pulses palpable bilaterally. Temperature gradient within normal limits.  Neuro Epicritic and protective threshold sensation grossly intact bilaterally.  Musculoskeletal Exam No symptomatic pedal deformities noted bilateral. Muscular strength within normal limits.  ASSESSMENT 1.  Pain due to onychomycosis of toenails both  PLAN OF CARE 1. Patient evaluated today.  2. Instructed to maintain good pedal hygiene and foot care.   3. Mechanical debridement of nails 1-5 bilaterally performed using a nail nipper. Filed with dremel without incident.  4. Return to clinic in 3 mos.    Edrick Kins, DPM [facility]  Dr. Edrick Kins, DPM    [address], [place] [address]                Office [phone]  Fax [phone]

## 2021-03-17 ENCOUNTER — Encounter (HOSPITAL_COMMUNITY): Payer: Self-pay | Admitting: Emergency Medicine

## 2021-03-17 ENCOUNTER — Emergency Department (HOSPITAL_COMMUNITY)
Admission: EM | Admit: 2021-03-17 | Discharge: 2021-03-18 | Disposition: A | Discharge status: Home / Self Care | Payer: Medicare HMO | Attending: Emergency Medicine | Admitting: Emergency Medicine

## 2021-03-17 ENCOUNTER — Other Ambulatory Visit: Payer: Self-pay

## 2021-03-17 DIAGNOSIS — Z79899 Other long term (current) drug therapy: Secondary | ICD-10-CM | POA: Diagnosis not present

## 2021-03-17 DIAGNOSIS — R0902 Hypoxemia: Secondary | ICD-10-CM | POA: Diagnosis not present

## 2021-03-17 DIAGNOSIS — T310 Burns involving less than 10% of body surface: Secondary | ICD-10-CM | POA: Diagnosis not present

## 2021-03-17 DIAGNOSIS — T24211A Burn of second degree of right thigh, initial encounter: Secondary | ICD-10-CM | POA: Diagnosis not present

## 2021-03-17 DIAGNOSIS — Z8546 Personal history of malignant neoplasm of prostate: Secondary | ICD-10-CM | POA: Diagnosis not present

## 2021-03-17 DIAGNOSIS — T300 Burn of unspecified body region, unspecified degree: Secondary | ICD-10-CM | POA: Diagnosis not present

## 2021-03-17 DIAGNOSIS — I10 Essential (primary) hypertension: Secondary | ICD-10-CM | POA: Insufficient documentation

## 2021-03-17 DIAGNOSIS — X088XXA Exposure to other specified smoke, fire and flames, initial encounter: Secondary | ICD-10-CM | POA: Diagnosis not present

## 2021-03-17 DIAGNOSIS — S79921A Unspecified injury of right thigh, initial encounter: Secondary | ICD-10-CM | POA: Diagnosis present

## 2021-03-17 DIAGNOSIS — F17210 Nicotine dependence, cigarettes, uncomplicated: Secondary | ICD-10-CM | POA: Insufficient documentation

## 2021-03-17 DIAGNOSIS — R000 Tachycardia, unspecified: Secondary | ICD-10-CM | POA: Diagnosis not present

## 2021-03-17 DIAGNOSIS — T24212A Burn of second degree of left thigh, initial encounter: Secondary | ICD-10-CM | POA: Insufficient documentation

## 2021-03-17 DIAGNOSIS — T24201A Burn of second degree of unspecified site of right lower limb, except ankle and foot, initial encounter: Secondary | ICD-10-CM

## 2021-03-17 DIAGNOSIS — T24202A Burn of second degree of unspecified site of left lower limb, except ankle and foot, initial encounter: Secondary | ICD-10-CM

## 2021-03-17 MED ORDER — OXYCODONE-ACETAMINOPHEN 5-325 MG PO TABS
1.0000 | ORAL_TABLET | Freq: Once | ORAL | Status: AC
  Administered 2021-03-17: 1 via ORAL
  Filled 2021-03-17: qty 1

## 2021-03-17 MED ORDER — HYDROMORPHONE HCL 1 MG/ML IJ SOLN
1.0000 mg | Freq: Once | INTRAMUSCULAR | Status: AC
Start: 2021-03-17 — End: 2021-03-17
  Administered 2021-03-17: 1 mg via INTRAVENOUS
  Filled 2021-03-17: qty 1

## 2021-03-17 MED ORDER — HYDROCODONE-ACETAMINOPHEN 5-325 MG PO TABS
2.0000 | ORAL_TABLET | Freq: Once | ORAL | Status: AC
  Administered 2021-03-17: 2 via ORAL
  Filled 2021-03-17: qty 2

## 2021-03-17 MED ORDER — SILVER SULFADIAZINE 1 % EX CREA
TOPICAL_CREAM | Freq: Once | CUTANEOUS | Status: AC
  Filled 2021-03-17: qty 85

## 2021-03-17 MED ORDER — HYDROMORPHONE HCL 1 MG/ML IJ SOLN
1.0000 mg | Freq: Once | INTRAMUSCULAR | Status: AC
  Administered 2021-03-17: 1 mg via INTRAVENOUS
  Filled 2021-03-17: qty 1

## 2021-03-17 MED ORDER — LACTATED RINGERS IV BOLUS
1000.0000 mL | Freq: Once | INTRAVENOUS | Status: AC
  Administered 2021-03-17: 1000 mL via INTRAVENOUS

## 2021-03-17 MED ORDER — HYDROCODONE-ACETAMINOPHEN 5-325 MG PO TABS: 2.0000 | ORAL_TABLET | ORAL | 0 refills | Status: DC | PRN

## 2021-03-17 NOTE — ED Notes (Signed)
Ptar called unable to give pick up time 
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
## 2021-03-17 NOTE — ED Notes (Signed)
Called [facility], patient is 8th on the list.
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
## 2021-03-18 ENCOUNTER — Telehealth: Payer: Self-pay | Admitting: Family Medicine
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
## 2021-03-19 ENCOUNTER — Other Ambulatory Visit: Payer: Self-pay | Admitting: Family Medicine

## 2021-03-19 DIAGNOSIS — Q2733 Arteriovenous malformation of digestive system vessel: Secondary | ICD-10-CM | POA: Diagnosis not present

## 2021-03-19 DIAGNOSIS — F329 Major depressive disorder, single episode, unspecified: Secondary | ICD-10-CM | POA: Diagnosis not present

## 2021-03-19 DIAGNOSIS — T23202D Burn of second degree of left hand, unspecified site, subsequent encounter: Secondary | ICD-10-CM | POA: Diagnosis not present

## 2021-03-19 DIAGNOSIS — M109 Gout, unspecified: Secondary | ICD-10-CM | POA: Diagnosis not present

## 2021-03-19 DIAGNOSIS — M1612 Unilateral primary osteoarthritis, left hip: Secondary | ICD-10-CM | POA: Diagnosis not present

## 2021-03-19 DIAGNOSIS — T24211D Burn of second degree of right thigh, subsequent encounter: Secondary | ICD-10-CM | POA: Diagnosis not present

## 2021-03-19 DIAGNOSIS — I10 Essential (primary) hypertension: Secondary | ICD-10-CM | POA: Diagnosis not present

## 2021-03-19 DIAGNOSIS — T24212D Burn of second degree of left thigh, subsequent encounter: Secondary | ICD-10-CM | POA: Diagnosis not present

## 2021-03-19 DIAGNOSIS — H409 Unspecified glaucoma: Secondary | ICD-10-CM | POA: Diagnosis not present

## 2021-04-06 ENCOUNTER — Ambulatory Visit: Payer: Medicare HMO | Admitting: Family Medicine

## 2021-04-07 ENCOUNTER — Ambulatory Visit (INDEPENDENT_AMBULATORY_CARE_PROVIDER_SITE_OTHER): Payer: Medicare HMO | Admitting: Family Medicine

## 2021-04-07 ENCOUNTER — Encounter: Payer: Self-pay | Admitting: Family Medicine

## 2021-04-07 ENCOUNTER — Other Ambulatory Visit: Payer: Self-pay

## 2021-04-07 VITALS — BP 140/84 | HR 95 | Temp 98.6°F

## 2021-04-07 DIAGNOSIS — T24211D Burn of second degree of right thigh, subsequent encounter: Secondary | ICD-10-CM

## 2021-04-07 MED ORDER — HYDROCODONE-ACETAMINOPHEN 5-325 MG PO TABS: 2.0000 | ORAL_TABLET | Freq: Four times a day (QID) | ORAL | 0 refills | Status: DC | PRN

## 2021-04-07 MED ORDER — SILVER SULFADIAZINE 1 % EX CREA: 1.0000 "application " | TOPICAL_CREAM | Freq: Every day | CUTANEOUS | 1 refills | Status: DC
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
## 2021-04-17 DIAGNOSIS — D509 Iron deficiency anemia, unspecified: Secondary | ICD-10-CM | POA: Diagnosis not present

## 2021-04-17 DIAGNOSIS — F17210 Nicotine dependence, cigarettes, uncomplicated: Secondary | ICD-10-CM | POA: Diagnosis not present

## 2021-04-17 DIAGNOSIS — I10 Essential (primary) hypertension: Secondary | ICD-10-CM | POA: Diagnosis not present

## 2021-04-17 DIAGNOSIS — X062XXD Exposure to ignition of other clothing and apparel, subsequent encounter: Secondary | ICD-10-CM | POA: Diagnosis not present

## 2021-04-17 DIAGNOSIS — T24211D Burn of second degree of right thigh, subsequent encounter: Secondary | ICD-10-CM | POA: Diagnosis not present

## 2021-04-17 DIAGNOSIS — K559 Vascular disorder of intestine, unspecified: Secondary | ICD-10-CM | POA: Diagnosis not present

## 2021-04-17 DIAGNOSIS — I720 Aneurysm of carotid artery: Secondary | ICD-10-CM | POA: Diagnosis not present

## 2021-04-17 DIAGNOSIS — K2100 Gastro-esophageal reflux disease with esophagitis, without bleeding: Secondary | ICD-10-CM | POA: Diagnosis not present

## 2021-04-17 DIAGNOSIS — I69151 Hemiplegia and hemiparesis following nontraumatic intracerebral hemorrhage affecting right dominant side: Secondary | ICD-10-CM | POA: Diagnosis not present

## 2021-04-20 ENCOUNTER — Telehealth: Payer: Self-pay | Admitting: Family Medicine

## 2021-04-20 NOTE — Telephone Encounter (Signed)
Spoke with Olin Hauser, per Dr Volanda Napoleon, gave verbal orders for wound care and PT.
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
## 2021-04-22 ENCOUNTER — Other Ambulatory Visit: Payer: Self-pay | Admitting: Family Medicine

## 2021-04-22 DIAGNOSIS — I10 Essential (primary) hypertension: Secondary | ICD-10-CM

## 2021-04-22 DIAGNOSIS — F419 Anxiety disorder, unspecified: Secondary | ICD-10-CM

## 2021-04-22 DIAGNOSIS — F32A Depression, unspecified: Secondary | ICD-10-CM

## 2021-04-23 DIAGNOSIS — K559 Vascular disorder of intestine, unspecified: Secondary | ICD-10-CM | POA: Diagnosis not present

## 2021-04-23 DIAGNOSIS — X062XXD Exposure to ignition of other clothing and apparel, subsequent encounter: Secondary | ICD-10-CM | POA: Diagnosis not present

## 2021-04-23 DIAGNOSIS — F17210 Nicotine dependence, cigarettes, uncomplicated: Secondary | ICD-10-CM | POA: Diagnosis not present

## 2021-04-23 DIAGNOSIS — T24211D Burn of second degree of right thigh, subsequent encounter: Secondary | ICD-10-CM | POA: Diagnosis not present

## 2021-04-23 DIAGNOSIS — I69151 Hemiplegia and hemiparesis following nontraumatic intracerebral hemorrhage affecting right dominant side: Secondary | ICD-10-CM | POA: Diagnosis not present

## 2021-04-23 DIAGNOSIS — D509 Iron deficiency anemia, unspecified: Secondary | ICD-10-CM | POA: Diagnosis not present

## 2021-04-23 DIAGNOSIS — I720 Aneurysm of carotid artery: Secondary | ICD-10-CM | POA: Diagnosis not present

## 2021-04-23 DIAGNOSIS — K2100 Gastro-esophageal reflux disease with esophagitis, without bleeding: Secondary | ICD-10-CM | POA: Diagnosis not present

## 2021-04-23 DIAGNOSIS — I10 Essential (primary) hypertension: Secondary | ICD-10-CM | POA: Diagnosis not present

## 2021-04-24 ENCOUNTER — Other Ambulatory Visit: Payer: Self-pay

## 2021-04-24 ENCOUNTER — Telehealth: Payer: Medicare HMO | Admitting: Family Medicine

## 2021-04-24 DIAGNOSIS — R481 Agnosia: Secondary | ICD-10-CM

## 2021-04-25 DIAGNOSIS — I720 Aneurysm of carotid artery: Secondary | ICD-10-CM | POA: Diagnosis not present

## 2021-04-25 DIAGNOSIS — F17210 Nicotine dependence, cigarettes, uncomplicated: Secondary | ICD-10-CM | POA: Diagnosis not present

## 2021-04-25 DIAGNOSIS — K559 Vascular disorder of intestine, unspecified: Secondary | ICD-10-CM | POA: Diagnosis not present

## 2021-04-25 DIAGNOSIS — T24211D Burn of second degree of right thigh, subsequent encounter: Secondary | ICD-10-CM | POA: Diagnosis not present

## 2021-04-25 DIAGNOSIS — D509 Iron deficiency anemia, unspecified: Secondary | ICD-10-CM | POA: Diagnosis not present

## 2021-04-25 DIAGNOSIS — X062XXD Exposure to ignition of other clothing and apparel, subsequent encounter: Secondary | ICD-10-CM | POA: Diagnosis not present

## 2021-04-25 DIAGNOSIS — I10 Essential (primary) hypertension: Secondary | ICD-10-CM | POA: Diagnosis not present

## 2021-04-25 DIAGNOSIS — I69151 Hemiplegia and hemiparesis following nontraumatic intracerebral hemorrhage affecting right dominant side: Secondary | ICD-10-CM | POA: Diagnosis not present

## 2021-04-25 DIAGNOSIS — K2100 Gastro-esophageal reflux disease with esophagitis, without bleeding: Secondary | ICD-10-CM | POA: Diagnosis not present

## 2021-04-27 ENCOUNTER — Telehealth: Payer: Self-pay

## 2021-04-27 DIAGNOSIS — K2100 Gastro-esophageal reflux disease with esophagitis, without bleeding: Secondary | ICD-10-CM | POA: Diagnosis not present

## 2021-04-27 DIAGNOSIS — X062XXD Exposure to ignition of other clothing and apparel, subsequent encounter: Secondary | ICD-10-CM | POA: Diagnosis not present

## 2021-04-27 DIAGNOSIS — K559 Vascular disorder of intestine, unspecified: Secondary | ICD-10-CM | POA: Diagnosis not present

## 2021-04-27 DIAGNOSIS — T24211D Burn of second degree of right thigh, subsequent encounter: Secondary | ICD-10-CM | POA: Diagnosis not present

## 2021-04-27 DIAGNOSIS — D509 Iron deficiency anemia, unspecified: Secondary | ICD-10-CM | POA: Diagnosis not present

## 2021-04-27 DIAGNOSIS — I720 Aneurysm of carotid artery: Secondary | ICD-10-CM | POA: Diagnosis not present

## 2021-04-27 DIAGNOSIS — I69151 Hemiplegia and hemiparesis following nontraumatic intracerebral hemorrhage affecting right dominant side: Secondary | ICD-10-CM | POA: Diagnosis not present

## 2021-04-27 DIAGNOSIS — F17210 Nicotine dependence, cigarettes, uncomplicated: Secondary | ICD-10-CM | POA: Diagnosis not present

## 2021-04-27 DIAGNOSIS — I10 Essential (primary) hypertension: Secondary | ICD-10-CM | POA: Diagnosis not present

## 2021-04-27 NOTE — Telephone Encounter (Signed)
[name] from [facility] called for verbal orders for home health physical therapy 1x 8 weeks.  Call back # [phone] ok to leave message

## 2021-04-27 NOTE — Telephone Encounter (Signed)
Spoke with Verdis Frederickson, gave verbal orders per Dr Volanda Napoleon.

## 2021-04-29 ENCOUNTER — Encounter: Payer: Medicare HMO | Attending: Physical Medicine & Rehabilitation | Admitting: Physical Medicine & Rehabilitation

## 2021-04-30 DIAGNOSIS — X062XXD Exposure to ignition of other clothing and apparel, subsequent encounter: Secondary | ICD-10-CM | POA: Diagnosis not present

## 2021-04-30 DIAGNOSIS — I720 Aneurysm of carotid artery: Secondary | ICD-10-CM | POA: Diagnosis not present

## 2021-04-30 DIAGNOSIS — K2100 Gastro-esophageal reflux disease with esophagitis, without bleeding: Secondary | ICD-10-CM | POA: Diagnosis not present

## 2021-04-30 DIAGNOSIS — I10 Essential (primary) hypertension: Secondary | ICD-10-CM | POA: Diagnosis not present

## 2021-04-30 DIAGNOSIS — D509 Iron deficiency anemia, unspecified: Secondary | ICD-10-CM | POA: Diagnosis not present

## 2021-04-30 DIAGNOSIS — I69151 Hemiplegia and hemiparesis following nontraumatic intracerebral hemorrhage affecting right dominant side: Secondary | ICD-10-CM | POA: Diagnosis not present

## 2021-04-30 DIAGNOSIS — F17210 Nicotine dependence, cigarettes, uncomplicated: Secondary | ICD-10-CM | POA: Diagnosis not present

## 2021-04-30 DIAGNOSIS — K559 Vascular disorder of intestine, unspecified: Secondary | ICD-10-CM | POA: Diagnosis not present

## 2021-04-30 DIAGNOSIS — T24211D Burn of second degree of right thigh, subsequent encounter: Secondary | ICD-10-CM | POA: Diagnosis not present

## 2021-05-01 ENCOUNTER — Telehealth: Payer: Self-pay | Admitting: Family Medicine

## 2021-05-01 NOTE — Telephone Encounter (Signed)
Gracee PT with [facility] is calling to report the pt missed his PT visits this week and she will try to see patient next week

## 2021-05-04 DIAGNOSIS — T24211D Burn of second degree of right thigh, subsequent encounter: Secondary | ICD-10-CM | POA: Diagnosis not present

## 2021-05-04 DIAGNOSIS — K2100 Gastro-esophageal reflux disease with esophagitis, without bleeding: Secondary | ICD-10-CM | POA: Diagnosis not present

## 2021-05-04 DIAGNOSIS — K559 Vascular disorder of intestine, unspecified: Secondary | ICD-10-CM | POA: Diagnosis not present

## 2021-05-04 DIAGNOSIS — D509 Iron deficiency anemia, unspecified: Secondary | ICD-10-CM | POA: Diagnosis not present

## 2021-05-04 DIAGNOSIS — I720 Aneurysm of carotid artery: Secondary | ICD-10-CM | POA: Diagnosis not present

## 2021-05-04 DIAGNOSIS — I69151 Hemiplegia and hemiparesis following nontraumatic intracerebral hemorrhage affecting right dominant side: Secondary | ICD-10-CM | POA: Diagnosis not present

## 2021-05-04 DIAGNOSIS — X062XXD Exposure to ignition of other clothing and apparel, subsequent encounter: Secondary | ICD-10-CM | POA: Diagnosis not present

## 2021-05-04 DIAGNOSIS — F17210 Nicotine dependence, cigarettes, uncomplicated: Secondary | ICD-10-CM | POA: Diagnosis not present

## 2021-05-04 DIAGNOSIS — I10 Essential (primary) hypertension: Secondary | ICD-10-CM | POA: Diagnosis not present

## 2021-05-06 DIAGNOSIS — X062XXD Exposure to ignition of other clothing and apparel, subsequent encounter: Secondary | ICD-10-CM | POA: Diagnosis not present

## 2021-05-06 DIAGNOSIS — T24211D Burn of second degree of right thigh, subsequent encounter: Secondary | ICD-10-CM | POA: Diagnosis not present

## 2021-05-06 DIAGNOSIS — K2100 Gastro-esophageal reflux disease with esophagitis, without bleeding: Secondary | ICD-10-CM | POA: Diagnosis not present

## 2021-05-06 DIAGNOSIS — I69151 Hemiplegia and hemiparesis following nontraumatic intracerebral hemorrhage affecting right dominant side: Secondary | ICD-10-CM | POA: Diagnosis not present

## 2021-05-06 DIAGNOSIS — D509 Iron deficiency anemia, unspecified: Secondary | ICD-10-CM | POA: Diagnosis not present

## 2021-05-06 DIAGNOSIS — K559 Vascular disorder of intestine, unspecified: Secondary | ICD-10-CM | POA: Diagnosis not present

## 2021-05-06 DIAGNOSIS — F17210 Nicotine dependence, cigarettes, uncomplicated: Secondary | ICD-10-CM | POA: Diagnosis not present

## 2021-05-06 DIAGNOSIS — I10 Essential (primary) hypertension: Secondary | ICD-10-CM | POA: Diagnosis not present

## 2021-05-06 DIAGNOSIS — I720 Aneurysm of carotid artery: Secondary | ICD-10-CM | POA: Diagnosis not present

## 2021-05-08 DIAGNOSIS — K559 Vascular disorder of intestine, unspecified: Secondary | ICD-10-CM | POA: Diagnosis not present

## 2021-05-08 DIAGNOSIS — T24211D Burn of second degree of right thigh, subsequent encounter: Secondary | ICD-10-CM | POA: Diagnosis not present

## 2021-05-08 DIAGNOSIS — X062XXD Exposure to ignition of other clothing and apparel, subsequent encounter: Secondary | ICD-10-CM | POA: Diagnosis not present

## 2021-05-08 DIAGNOSIS — D509 Iron deficiency anemia, unspecified: Secondary | ICD-10-CM | POA: Diagnosis not present

## 2021-05-08 DIAGNOSIS — I10 Essential (primary) hypertension: Secondary | ICD-10-CM | POA: Diagnosis not present

## 2021-05-08 DIAGNOSIS — F17210 Nicotine dependence, cigarettes, uncomplicated: Secondary | ICD-10-CM | POA: Diagnosis not present

## 2021-05-08 DIAGNOSIS — I720 Aneurysm of carotid artery: Secondary | ICD-10-CM | POA: Diagnosis not present

## 2021-05-08 DIAGNOSIS — I69151 Hemiplegia and hemiparesis following nontraumatic intracerebral hemorrhage affecting right dominant side: Secondary | ICD-10-CM | POA: Diagnosis not present

## 2021-05-08 DIAGNOSIS — K2100 Gastro-esophageal reflux disease with esophagitis, without bleeding: Secondary | ICD-10-CM | POA: Diagnosis not present

## 2021-05-11 DIAGNOSIS — I10 Essential (primary) hypertension: Secondary | ICD-10-CM | POA: Diagnosis not present

## 2021-05-11 DIAGNOSIS — D509 Iron deficiency anemia, unspecified: Secondary | ICD-10-CM | POA: Diagnosis not present

## 2021-05-11 DIAGNOSIS — F17210 Nicotine dependence, cigarettes, uncomplicated: Secondary | ICD-10-CM | POA: Diagnosis not present

## 2021-05-11 DIAGNOSIS — T24211D Burn of second degree of right thigh, subsequent encounter: Secondary | ICD-10-CM | POA: Diagnosis not present

## 2021-05-11 DIAGNOSIS — I720 Aneurysm of carotid artery: Secondary | ICD-10-CM | POA: Diagnosis not present

## 2021-05-11 DIAGNOSIS — K559 Vascular disorder of intestine, unspecified: Secondary | ICD-10-CM | POA: Diagnosis not present

## 2021-05-11 DIAGNOSIS — I69151 Hemiplegia and hemiparesis following nontraumatic intracerebral hemorrhage affecting right dominant side: Secondary | ICD-10-CM | POA: Diagnosis not present

## 2021-05-11 DIAGNOSIS — K2100 Gastro-esophageal reflux disease with esophagitis, without bleeding: Secondary | ICD-10-CM | POA: Diagnosis not present

## 2021-05-11 DIAGNOSIS — X062XXD Exposure to ignition of other clothing and apparel, subsequent encounter: Secondary | ICD-10-CM | POA: Diagnosis not present

## 2021-05-12 DIAGNOSIS — D509 Iron deficiency anemia, unspecified: Secondary | ICD-10-CM | POA: Diagnosis not present

## 2021-05-12 DIAGNOSIS — X062XXD Exposure to ignition of other clothing and apparel, subsequent encounter: Secondary | ICD-10-CM | POA: Diagnosis not present

## 2021-05-12 DIAGNOSIS — F17210 Nicotine dependence, cigarettes, uncomplicated: Secondary | ICD-10-CM | POA: Diagnosis not present

## 2021-05-12 DIAGNOSIS — I69151 Hemiplegia and hemiparesis following nontraumatic intracerebral hemorrhage affecting right dominant side: Secondary | ICD-10-CM | POA: Diagnosis not present

## 2021-05-12 DIAGNOSIS — K2100 Gastro-esophageal reflux disease with esophagitis, without bleeding: Secondary | ICD-10-CM | POA: Diagnosis not present

## 2021-05-12 DIAGNOSIS — K559 Vascular disorder of intestine, unspecified: Secondary | ICD-10-CM | POA: Diagnosis not present

## 2021-05-12 DIAGNOSIS — I720 Aneurysm of carotid artery: Secondary | ICD-10-CM | POA: Diagnosis not present

## 2021-05-12 DIAGNOSIS — T24211D Burn of second degree of right thigh, subsequent encounter: Secondary | ICD-10-CM | POA: Diagnosis not present

## 2021-05-12 DIAGNOSIS — I10 Essential (primary) hypertension: Secondary | ICD-10-CM | POA: Diagnosis not present

## 2021-05-15 DIAGNOSIS — I10 Essential (primary) hypertension: Secondary | ICD-10-CM | POA: Diagnosis not present

## 2021-05-15 DIAGNOSIS — F17210 Nicotine dependence, cigarettes, uncomplicated: Secondary | ICD-10-CM | POA: Diagnosis not present

## 2021-05-15 DIAGNOSIS — K559 Vascular disorder of intestine, unspecified: Secondary | ICD-10-CM | POA: Diagnosis not present

## 2021-05-15 DIAGNOSIS — D509 Iron deficiency anemia, unspecified: Secondary | ICD-10-CM | POA: Diagnosis not present

## 2021-05-15 DIAGNOSIS — I720 Aneurysm of carotid artery: Secondary | ICD-10-CM | POA: Diagnosis not present

## 2021-05-15 DIAGNOSIS — K2100 Gastro-esophageal reflux disease with esophagitis, without bleeding: Secondary | ICD-10-CM | POA: Diagnosis not present

## 2021-05-15 DIAGNOSIS — I69151 Hemiplegia and hemiparesis following nontraumatic intracerebral hemorrhage affecting right dominant side: Secondary | ICD-10-CM | POA: Diagnosis not present

## 2021-05-15 DIAGNOSIS — T24211D Burn of second degree of right thigh, subsequent encounter: Secondary | ICD-10-CM | POA: Diagnosis not present

## 2021-05-15 DIAGNOSIS — X062XXD Exposure to ignition of other clothing and apparel, subsequent encounter: Secondary | ICD-10-CM | POA: Diagnosis not present

## 2021-05-17 DIAGNOSIS — K2100 Gastro-esophageal reflux disease with esophagitis, without bleeding: Secondary | ICD-10-CM | POA: Diagnosis not present

## 2021-05-17 DIAGNOSIS — I10 Essential (primary) hypertension: Secondary | ICD-10-CM | POA: Diagnosis not present

## 2021-05-17 DIAGNOSIS — I69151 Hemiplegia and hemiparesis following nontraumatic intracerebral hemorrhage affecting right dominant side: Secondary | ICD-10-CM | POA: Diagnosis not present

## 2021-05-17 DIAGNOSIS — X062XXD Exposure to ignition of other clothing and apparel, subsequent encounter: Secondary | ICD-10-CM | POA: Diagnosis not present

## 2021-05-17 DIAGNOSIS — T24211D Burn of second degree of right thigh, subsequent encounter: Secondary | ICD-10-CM | POA: Diagnosis not present

## 2021-05-17 DIAGNOSIS — I720 Aneurysm of carotid artery: Secondary | ICD-10-CM | POA: Diagnosis not present

## 2021-05-17 DIAGNOSIS — F17210 Nicotine dependence, cigarettes, uncomplicated: Secondary | ICD-10-CM | POA: Diagnosis not present

## 2021-05-17 DIAGNOSIS — D509 Iron deficiency anemia, unspecified: Secondary | ICD-10-CM | POA: Diagnosis not present

## 2021-05-17 DIAGNOSIS — K559 Vascular disorder of intestine, unspecified: Secondary | ICD-10-CM | POA: Diagnosis not present

## 2021-05-19 DIAGNOSIS — I720 Aneurysm of carotid artery: Secondary | ICD-10-CM | POA: Diagnosis not present

## 2021-05-19 DIAGNOSIS — X062XXD Exposure to ignition of other clothing and apparel, subsequent encounter: Secondary | ICD-10-CM | POA: Diagnosis not present

## 2021-05-19 DIAGNOSIS — K559 Vascular disorder of intestine, unspecified: Secondary | ICD-10-CM | POA: Diagnosis not present

## 2021-05-19 DIAGNOSIS — K2100 Gastro-esophageal reflux disease with esophagitis, without bleeding: Secondary | ICD-10-CM | POA: Diagnosis not present

## 2021-05-19 DIAGNOSIS — T24211D Burn of second degree of right thigh, subsequent encounter: Secondary | ICD-10-CM | POA: Diagnosis not present

## 2021-05-19 DIAGNOSIS — I10 Essential (primary) hypertension: Secondary | ICD-10-CM | POA: Diagnosis not present

## 2021-05-19 DIAGNOSIS — I69151 Hemiplegia and hemiparesis following nontraumatic intracerebral hemorrhage affecting right dominant side: Secondary | ICD-10-CM | POA: Diagnosis not present

## 2021-05-19 DIAGNOSIS — D509 Iron deficiency anemia, unspecified: Secondary | ICD-10-CM | POA: Diagnosis not present

## 2021-05-19 DIAGNOSIS — F17210 Nicotine dependence, cigarettes, uncomplicated: Secondary | ICD-10-CM | POA: Diagnosis not present

## 2021-05-22 DIAGNOSIS — K2100 Gastro-esophageal reflux disease with esophagitis, without bleeding: Secondary | ICD-10-CM | POA: Diagnosis not present

## 2021-05-22 DIAGNOSIS — K559 Vascular disorder of intestine, unspecified: Secondary | ICD-10-CM | POA: Diagnosis not present

## 2021-05-22 DIAGNOSIS — I720 Aneurysm of carotid artery: Secondary | ICD-10-CM | POA: Diagnosis not present

## 2021-05-22 DIAGNOSIS — X062XXD Exposure to ignition of other clothing and apparel, subsequent encounter: Secondary | ICD-10-CM | POA: Diagnosis not present

## 2021-05-22 DIAGNOSIS — T24211D Burn of second degree of right thigh, subsequent encounter: Secondary | ICD-10-CM | POA: Diagnosis not present

## 2021-05-22 DIAGNOSIS — I69151 Hemiplegia and hemiparesis following nontraumatic intracerebral hemorrhage affecting right dominant side: Secondary | ICD-10-CM | POA: Diagnosis not present

## 2021-05-22 DIAGNOSIS — I10 Essential (primary) hypertension: Secondary | ICD-10-CM | POA: Diagnosis not present

## 2021-05-22 DIAGNOSIS — F17210 Nicotine dependence, cigarettes, uncomplicated: Secondary | ICD-10-CM | POA: Diagnosis not present

## 2021-05-22 DIAGNOSIS — D509 Iron deficiency anemia, unspecified: Secondary | ICD-10-CM | POA: Diagnosis not present

## 2021-05-25 DIAGNOSIS — K559 Vascular disorder of intestine, unspecified: Secondary | ICD-10-CM | POA: Diagnosis not present

## 2021-05-25 DIAGNOSIS — I10 Essential (primary) hypertension: Secondary | ICD-10-CM | POA: Diagnosis not present

## 2021-05-25 DIAGNOSIS — D509 Iron deficiency anemia, unspecified: Secondary | ICD-10-CM | POA: Diagnosis not present

## 2021-05-25 DIAGNOSIS — I720 Aneurysm of carotid artery: Secondary | ICD-10-CM | POA: Diagnosis not present

## 2021-05-25 DIAGNOSIS — F17210 Nicotine dependence, cigarettes, uncomplicated: Secondary | ICD-10-CM | POA: Diagnosis not present

## 2021-05-25 DIAGNOSIS — I69151 Hemiplegia and hemiparesis following nontraumatic intracerebral hemorrhage affecting right dominant side: Secondary | ICD-10-CM | POA: Diagnosis not present

## 2021-05-25 DIAGNOSIS — K2100 Gastro-esophageal reflux disease with esophagitis, without bleeding: Secondary | ICD-10-CM | POA: Diagnosis not present

## 2021-05-25 DIAGNOSIS — X062XXD Exposure to ignition of other clothing and apparel, subsequent encounter: Secondary | ICD-10-CM | POA: Diagnosis not present

## 2021-05-25 DIAGNOSIS — T24211D Burn of second degree of right thigh, subsequent encounter: Secondary | ICD-10-CM | POA: Diagnosis not present

## 2021-05-26 ENCOUNTER — Other Ambulatory Visit: Payer: Self-pay | Admitting: Family Medicine

## 2021-05-26 DIAGNOSIS — F419 Anxiety disorder, unspecified: Secondary | ICD-10-CM

## 2021-05-26 DIAGNOSIS — F32A Depression, unspecified: Secondary | ICD-10-CM

## 2021-05-28 ENCOUNTER — Other Ambulatory Visit: Payer: Self-pay | Admitting: Family Medicine

## 2021-05-28 DIAGNOSIS — I10 Essential (primary) hypertension: Secondary | ICD-10-CM

## 2021-05-29 DIAGNOSIS — I10 Essential (primary) hypertension: Secondary | ICD-10-CM | POA: Diagnosis not present

## 2021-05-29 DIAGNOSIS — T24211D Burn of second degree of right thigh, subsequent encounter: Secondary | ICD-10-CM | POA: Diagnosis not present

## 2021-05-29 DIAGNOSIS — F17210 Nicotine dependence, cigarettes, uncomplicated: Secondary | ICD-10-CM | POA: Diagnosis not present

## 2021-05-29 DIAGNOSIS — K2100 Gastro-esophageal reflux disease with esophagitis, without bleeding: Secondary | ICD-10-CM | POA: Diagnosis not present

## 2021-05-29 DIAGNOSIS — I69151 Hemiplegia and hemiparesis following nontraumatic intracerebral hemorrhage affecting right dominant side: Secondary | ICD-10-CM | POA: Diagnosis not present

## 2021-05-29 DIAGNOSIS — I720 Aneurysm of carotid artery: Secondary | ICD-10-CM | POA: Diagnosis not present

## 2021-05-29 DIAGNOSIS — K559 Vascular disorder of intestine, unspecified: Secondary | ICD-10-CM | POA: Diagnosis not present

## 2021-05-29 DIAGNOSIS — X062XXD Exposure to ignition of other clothing and apparel, subsequent encounter: Secondary | ICD-10-CM | POA: Diagnosis not present

## 2021-05-29 DIAGNOSIS — D509 Iron deficiency anemia, unspecified: Secondary | ICD-10-CM | POA: Diagnosis not present

## 2021-06-01 ENCOUNTER — Telehealth: Payer: Self-pay

## 2021-06-01 DIAGNOSIS — I69151 Hemiplegia and hemiparesis following nontraumatic intracerebral hemorrhage affecting right dominant side: Secondary | ICD-10-CM | POA: Diagnosis not present

## 2021-06-01 DIAGNOSIS — I10 Essential (primary) hypertension: Secondary | ICD-10-CM | POA: Diagnosis not present

## 2021-06-01 DIAGNOSIS — D509 Iron deficiency anemia, unspecified: Secondary | ICD-10-CM | POA: Diagnosis not present

## 2021-06-01 DIAGNOSIS — F17210 Nicotine dependence, cigarettes, uncomplicated: Secondary | ICD-10-CM | POA: Diagnosis not present

## 2021-06-01 DIAGNOSIS — T24211D Burn of second degree of right thigh, subsequent encounter: Secondary | ICD-10-CM | POA: Diagnosis not present

## 2021-06-01 DIAGNOSIS — X062XXD Exposure to ignition of other clothing and apparel, subsequent encounter: Secondary | ICD-10-CM | POA: Diagnosis not present

## 2021-06-01 DIAGNOSIS — I720 Aneurysm of carotid artery: Secondary | ICD-10-CM | POA: Diagnosis not present

## 2021-06-01 DIAGNOSIS — K2100 Gastro-esophageal reflux disease with esophagitis, without bleeding: Secondary | ICD-10-CM | POA: Diagnosis not present

## 2021-06-01 DIAGNOSIS — K559 Vascular disorder of intestine, unspecified: Secondary | ICD-10-CM | POA: Diagnosis not present

## 2021-06-01 NOTE — Telephone Encounter (Signed)
Randy from [facility] called to inform Dr. Volanda Napoleon that patient has some swelling in his legs and feet and wants to know if patient can be prescribed Laxis Call back # [phone]

## 2021-06-02 ENCOUNTER — Other Ambulatory Visit: Payer: Self-pay | Admitting: Family Medicine

## 2021-06-02 DIAGNOSIS — I10 Essential (primary) hypertension: Secondary | ICD-10-CM

## 2021-06-03 DIAGNOSIS — I69151 Hemiplegia and hemiparesis following nontraumatic intracerebral hemorrhage affecting right dominant side: Secondary | ICD-10-CM | POA: Diagnosis not present

## 2021-06-03 DIAGNOSIS — I720 Aneurysm of carotid artery: Secondary | ICD-10-CM | POA: Diagnosis not present

## 2021-06-03 DIAGNOSIS — K559 Vascular disorder of intestine, unspecified: Secondary | ICD-10-CM | POA: Diagnosis not present

## 2021-06-03 DIAGNOSIS — I10 Essential (primary) hypertension: Secondary | ICD-10-CM | POA: Diagnosis not present

## 2021-06-03 DIAGNOSIS — K2100 Gastro-esophageal reflux disease with esophagitis, without bleeding: Secondary | ICD-10-CM | POA: Diagnosis not present

## 2021-06-03 DIAGNOSIS — D509 Iron deficiency anemia, unspecified: Secondary | ICD-10-CM | POA: Diagnosis not present

## 2021-06-03 DIAGNOSIS — T24211D Burn of second degree of right thigh, subsequent encounter: Secondary | ICD-10-CM | POA: Diagnosis not present

## 2021-06-03 DIAGNOSIS — X062XXD Exposure to ignition of other clothing and apparel, subsequent encounter: Secondary | ICD-10-CM | POA: Diagnosis not present

## 2021-06-03 DIAGNOSIS — F17210 Nicotine dependence, cigarettes, uncomplicated: Secondary | ICD-10-CM | POA: Diagnosis not present

## 2021-06-08 DIAGNOSIS — X062XXD Exposure to ignition of other clothing and apparel, subsequent encounter: Secondary | ICD-10-CM | POA: Diagnosis not present

## 2021-06-08 DIAGNOSIS — F17210 Nicotine dependence, cigarettes, uncomplicated: Secondary | ICD-10-CM | POA: Diagnosis not present

## 2021-06-08 DIAGNOSIS — I720 Aneurysm of carotid artery: Secondary | ICD-10-CM | POA: Diagnosis not present

## 2021-06-08 DIAGNOSIS — T24211D Burn of second degree of right thigh, subsequent encounter: Secondary | ICD-10-CM | POA: Diagnosis not present

## 2021-06-08 DIAGNOSIS — I10 Essential (primary) hypertension: Secondary | ICD-10-CM | POA: Diagnosis not present

## 2021-06-08 DIAGNOSIS — D509 Iron deficiency anemia, unspecified: Secondary | ICD-10-CM | POA: Diagnosis not present

## 2021-06-08 DIAGNOSIS — K559 Vascular disorder of intestine, unspecified: Secondary | ICD-10-CM | POA: Diagnosis not present

## 2021-06-08 DIAGNOSIS — K2100 Gastro-esophageal reflux disease with esophagitis, without bleeding: Secondary | ICD-10-CM | POA: Diagnosis not present

## 2021-06-08 DIAGNOSIS — I69151 Hemiplegia and hemiparesis following nontraumatic intracerebral hemorrhage affecting right dominant side: Secondary | ICD-10-CM | POA: Diagnosis not present

## 2021-06-08 NOTE — Telephone Encounter (Signed)
Patient on chlorthalidone.  Would need to be seen for medication adjustments if needed.  Continue supportive care including elevation and decreasing sodium intake.

## 2021-06-10 NOTE — Telephone Encounter (Signed)
Left message on machine for [name] to return our call.

## 2021-06-12 DIAGNOSIS — T24211D Burn of second degree of right thigh, subsequent encounter: Secondary | ICD-10-CM | POA: Diagnosis not present

## 2021-06-12 DIAGNOSIS — X062XXD Exposure to ignition of other clothing and apparel, subsequent encounter: Secondary | ICD-10-CM | POA: Diagnosis not present

## 2021-06-12 DIAGNOSIS — I10 Essential (primary) hypertension: Secondary | ICD-10-CM | POA: Diagnosis not present

## 2021-06-12 DIAGNOSIS — I720 Aneurysm of carotid artery: Secondary | ICD-10-CM | POA: Diagnosis not present

## 2021-06-12 DIAGNOSIS — I69151 Hemiplegia and hemiparesis following nontraumatic intracerebral hemorrhage affecting right dominant side: Secondary | ICD-10-CM | POA: Diagnosis not present

## 2021-06-12 DIAGNOSIS — F17210 Nicotine dependence, cigarettes, uncomplicated: Secondary | ICD-10-CM | POA: Diagnosis not present

## 2021-06-12 DIAGNOSIS — D509 Iron deficiency anemia, unspecified: Secondary | ICD-10-CM | POA: Diagnosis not present

## 2021-06-12 DIAGNOSIS — K2100 Gastro-esophageal reflux disease with esophagitis, without bleeding: Secondary | ICD-10-CM | POA: Diagnosis not present

## 2021-06-12 DIAGNOSIS — K559 Vascular disorder of intestine, unspecified: Secondary | ICD-10-CM | POA: Diagnosis not present

## 2021-06-15 ENCOUNTER — Telehealth: Payer: Self-pay | Admitting: Family Medicine

## 2021-06-15 ENCOUNTER — Ambulatory Visit: Payer: Medicare HMO | Admitting: Family Medicine

## 2021-06-15 NOTE — Telephone Encounter (Signed)
Pt has appointment today, 11/28.

## 2021-06-15 NOTE — Telephone Encounter (Signed)
Araceli Bouche (PT) with [facility] called in verbal orders. Requesting frequency of 1 week 9.  Araceli Bouche could be contacted at [phone].  Please advise.

## 2021-06-16 ENCOUNTER — Telehealth: Payer: Self-pay

## 2021-06-16 DIAGNOSIS — I720 Aneurysm of carotid artery: Secondary | ICD-10-CM | POA: Diagnosis not present

## 2021-06-16 DIAGNOSIS — X062XXD Exposure to ignition of other clothing and apparel, subsequent encounter: Secondary | ICD-10-CM | POA: Diagnosis not present

## 2021-06-16 DIAGNOSIS — D509 Iron deficiency anemia, unspecified: Secondary | ICD-10-CM | POA: Diagnosis not present

## 2021-06-16 DIAGNOSIS — T24211D Burn of second degree of right thigh, subsequent encounter: Secondary | ICD-10-CM | POA: Diagnosis not present

## 2021-06-16 DIAGNOSIS — F17210 Nicotine dependence, cigarettes, uncomplicated: Secondary | ICD-10-CM | POA: Diagnosis not present

## 2021-06-16 DIAGNOSIS — I10 Essential (primary) hypertension: Secondary | ICD-10-CM | POA: Diagnosis not present

## 2021-06-16 DIAGNOSIS — K559 Vascular disorder of intestine, unspecified: Secondary | ICD-10-CM | POA: Diagnosis not present

## 2021-06-16 DIAGNOSIS — I69151 Hemiplegia and hemiparesis following nontraumatic intracerebral hemorrhage affecting right dominant side: Secondary | ICD-10-CM | POA: Diagnosis not present

## 2021-06-16 DIAGNOSIS — K2100 Gastro-esophageal reflux disease with esophagitis, without bleeding: Secondary | ICD-10-CM | POA: Diagnosis not present

## 2021-06-16 NOTE — Telephone Encounter (Signed)
Carra from [facility] called asking for verbal orders for recertification  for nursing wound care 2 x 9 weeks Physical therapy 1 x 9 weeks Call back # [phone]

## 2021-06-17 ENCOUNTER — Telehealth: Payer: Self-pay

## 2021-06-17 ENCOUNTER — Ambulatory Visit (INDEPENDENT_AMBULATORY_CARE_PROVIDER_SITE_OTHER): Payer: Medicare HMO | Admitting: Family Medicine

## 2021-06-17 VITALS — BP 136/92 | HR 75 | Temp 98.4°F

## 2021-06-17 DIAGNOSIS — M7021 Olecranon bursitis, right elbow: Secondary | ICD-10-CM

## 2021-06-17 DIAGNOSIS — Z8739 Personal history of other diseases of the musculoskeletal system and connective tissue: Secondary | ICD-10-CM

## 2021-06-17 DIAGNOSIS — D508 Other iron deficiency anemias: Secondary | ICD-10-CM | POA: Diagnosis not present

## 2021-06-17 DIAGNOSIS — M79672 Pain in left foot: Secondary | ICD-10-CM | POA: Diagnosis not present

## 2021-06-17 DIAGNOSIS — R481 Agnosia: Secondary | ICD-10-CM

## 2021-06-17 DIAGNOSIS — M79671 Pain in right foot: Secondary | ICD-10-CM | POA: Diagnosis not present

## 2021-06-17 DIAGNOSIS — R600 Localized edema: Secondary | ICD-10-CM | POA: Diagnosis not present

## 2021-06-17 DIAGNOSIS — M7022 Olecranon bursitis, left elbow: Secondary | ICD-10-CM

## 2021-06-17 DIAGNOSIS — K219 Gastro-esophageal reflux disease without esophagitis: Secondary | ICD-10-CM | POA: Diagnosis not present

## 2021-06-17 LAB — LIPID PANEL
Cholesterol: 108 mg/dL (ref 0–200)
HDL: 39.8 mg/dL (ref >39.00)
LDL Cholesterol: 47 mg/dL (ref 0–99)
NonHDL: 68.12
Total CHOL/HDL Ratio: 3
Triglycerides: 104 mg/dL (ref 0.0–149.0)
VLDL: 20.8 mg/dL (ref 0.0–40.0)

## 2021-06-17 LAB — CBC WITH DIFFERENTIAL/PLATELET
Basophils Absolute: 0.1 10*3/uL (ref 0.0–0.1)
Basophils Relative: 1.1 % (ref 0.0–3.0)
Eosinophils Absolute: 0.1 10*3/uL (ref 0.0–0.7)
Eosinophils Relative: 1.9 % (ref 0.0–5.0)
HCT: 30.5 % — ABNORMAL LOW (ref 39.0–52.0)
Hemoglobin: 9.3 g/dL — ABNORMAL LOW (ref 13.0–17.0)
Lymphocytes Relative: 23 % (ref 12.0–46.0)
Lymphs Abs: 1.3 10*3/uL (ref 0.7–4.0)
MCHC: 30.4 g/dL (ref 30.0–36.0)
MCV: 71 fl — ABNORMAL LOW (ref 78.0–100.0)
Monocytes Absolute: 0.5 10*3/uL (ref 0.1–1.0)
Monocytes Relative: 9.8 % (ref 3.0–12.0)
Neutro Abs: 3.6 10*3/uL (ref 1.4–7.7)
Neutrophils Relative %: 64.2 % (ref 43.0–77.0)
Platelets: 494 10*3/uL — ABNORMAL HIGH (ref 150.0–400.0)
RBC: 4.3 Mil/uL (ref 4.22–5.81)
RDW: 19.6 % — ABNORMAL HIGH (ref 11.5–15.5)
WBC: 5.6 10*3/uL (ref 4.0–10.5)

## 2021-06-17 LAB — COMPREHENSIVE METABOLIC PANEL
ALT: 9 U/L (ref 0–53)
AST: 8 U/L (ref 0–37)
Albumin: 4.1 g/dL (ref 3.5–5.2)
Alkaline Phosphatase: 110 U/L (ref 39–117)
BUN: 16 mg/dL (ref 6–23)
CO2: 29 mEq/L (ref 19–32)
Calcium: 9.5 mg/dL (ref 8.4–10.5)
Chloride: 105 mEq/L (ref 96–112)
Creatinine, Ser: 1.02 mg/dL (ref 0.40–1.50)
GFR: 73.56 mL/min (ref >60.00)
Glucose, Bld: 89 mg/dL (ref 70–99)
Potassium: 4.2 mEq/L (ref 3.5–5.1)
Sodium: 140 mEq/L (ref 135–145)
Total Bilirubin: 0.4 mg/dL (ref 0.2–1.2)
Total Protein: 7.3 g/dL (ref 6.0–8.3)

## 2021-06-17 LAB — URIC ACID: Uric Acid, Serum: 7.4 mg/dL (ref 4.0–7.8)

## 2021-06-17 LAB — BRAIN NATRIURETIC PEPTIDE: Pro B Natriuretic peptide (BNP): 34 pg/mL (ref 0.0–100.0)

## 2021-06-17 NOTE — Telephone Encounter (Signed)
[name] from [employer] called to check on the status of document that were faxed on Nov. 17 for repairs to wheelchair Call back # [phone]

## 2021-06-17 NOTE — Telephone Encounter (Signed)
Araceli Bouche PT with [facility] is calling checking on the status of verbal orders

## 2021-06-17 NOTE — Progress Notes (Signed)
Subjective:    Patient ID: [name], male    DOB: 1950/12/09, 70 y.o.   MRN: [id number]  Chief Complaint  Patient presents with   Elbow Injury    Knot on left elbo, noticed this week    Gastroesophageal Reflux   Gout  Patient accompanied by his brother.  HPI Patient was seen today for ongoing concerns. Pt with R elbow edema, noted this wk.  Denies elbow pain or increased warmth.  Pt also endorses gout flair in b/l feet with pain across b/l mid foot.  At times unable to get shoes on.  Eating bologna sandwiches regularly and prepackaged foods.  Patient endorses increasing reflux symptoms which makes it difficult to eat anything.  Water now causing symptoms.  Pt still without taste or smell.  Previously referred to ENT however yet to be seen 2/2 various factors. Inquires about having Hgb checked as previously low and pt was asymptomatic.  Has required txf.    Past Medical History:  Diagnosis Date   Arthritis    [name] ulcer, chronic 01/11/2018   Chronic back pain    GERD (gastroesophageal reflux disease)    uses Baking Soda   GIB (gastrointestinal bleeding) 2012   Glaucoma    right eye   Headache    occasionally   Hepatitis C    Hiatal hernia with gastroesophageal reflux disease and esophagitis 01/05/2018   History of blood transfusion    no abnormal reaction noted   History of gout    doesn't take any meds   Hyperlipidemia    not on any meds   Insomnia    takes Ambien nightly   Ischemic colitis ([place]) 2012   Joint pain    Nocturia    Numbness    both legs occasionally   PONV (postoperative nausea and vomiting)    Prostate cancer (HCC)    Shortness of breath dyspnea    rarely but when notices he can be lying/sitting/exertion.Dr.Hochrein is aware per pt   Stroke [facility]) 2016    Urinary frequency    Urinary urgency     Allergies  Allergen Reactions   Penicillins Anaphylaxis    Has patient had a PCN reaction causing immediate rash, facial/tongue/throat swelling, SOB  or lightheadedness with hypotension: Yes Has patient had a PCN reaction causing severe rash involving mucus membranes or skin necrosis: No Has patient had a PCN reaction that required hospitalization: No Has patient had a PCN reaction occurring within the last 10 years: No If all of the above answers are "NO", then may proceed with Cephalosporin use.[name] [Pseudoephedrine] Palpitations and Other (See Comments)    Heart "races"    ROS General: Denies fever, chills, night sweats, changes in weight, changes in appetite HEENT: Denies headaches, ear pain, changes in vision, rhinorrhea, sore throat +loss of taste and smell CV: Denies CP, palpitations, SOB, orthopnea Pulm: Denies SOB, cough, wheezing GI: Denies abdominal pain, nausea, vomiting, diarrhea, constipation GU: Denies dysuria, hematuria, frequency, vaginal discharge Msk: Denies muscle cramps, joint pains +b/l foot pain and edema Neuro: Denies weakness, numbness, tingling  Skin: Denies rashes, bruising  +R elbow edema Psych: Denies depression, anxiety, hallucinations    Objective:    Blood pressure (!) 136/92, pulse 75, temperature 98.4 F (36.9 C), temperature source Oral, SpO2 96 %.  Gen. Pleasant, well-nourished, in no distress, normal affect   HEENT: [place]/AT, face symmetric, conjunctiva clear, no scleral icterus, PERRLA, EOMI, nares patent without drainage, pharynx without erythema or exudate.  Lungs: no accessory muscle use, CTAB, no wheezes or rales Cardiovascular: RRR, no m/r/g, no peripheral edema Musculoskeletal: No deformities, no cyanosis or clubbing, normal tone Neuro:  A&Ox3, CN II-XII intact, sitting in transport wheelchair Skin:  Warm, no lesions/ rash   Wt Readings from Last 3 Encounters:  03/17/21 169 lb (76.7 kg)  12/25/20 173 lb 1 oz (78.5 kg)  07/30/20 173 lb (78.5 kg)    Lab Results  Component Value Date   WBC 6.8 02/04/2021   HGB 10.6 (L) 02/04/2021   HCT 33.2 (L) 02/04/2021   PLT 353.0  02/04/2021   GLUCOSE 86 02/04/2021   CHOL 102 07/22/2018   TRIG 108 07/22/2018   HDL 35 (L) 07/22/2018   LDLCALC 45 07/22/2018   ALT 11 12/24/2020   AST 14 (L) 12/24/2020   NA 142 02/04/2021   K 3.8 02/04/2021   CL 107 02/04/2021   CREATININE 1.04 02/04/2021   BUN 20 02/04/2021   CO2 24 02/04/2021   TSH 1.01 12/24/2020   INR 1.4 (H) 12/25/2020   HGBA1C 4.7 (L) 07/22/2018    Assessment/Plan:  Agnosia for smell -Chronic  - Plan: Ambulatory referral to ENT  Agnosia for taste  -Chronic - Plan: Ambulatory referral to ENT  Gastroesophageal reflux disease, unspecified whether esophagitis present  -likely 2/2 h/o large hiatal hernia as noted on EGD from 12/26/20 while hospitalized for symptomatic anemia -Continue pantoprazole 40 mg twice daily.  Advised to pick up refill from pharmacy. - Plan: Ambulatory referral to Gastroenterology  Pain in both feet  -Likely 2/2 gout -Obtain labs -Prednisone taper - Plan: CBC with Differential/Platelet, predniSONE (DELTASONE) 10 MG tablet  Edema of foot  -Likely multifactorial 2/2 dependent edema and recent gout flare -Supportive care encouraged including elevating LEs, decreasing sodium intake, TED hose or compression socks - Plan: CMP, Uric Acid, Brain Natriuretic Peptide  Olecranon bursitis of right elbow -Acute edema of right elbow -discussed treatment options including drainage -continue supportive care - Plan: Lipid panel, CBC with Differential/Platelet  History of gout -Improving -prednisone taper -diet changes advised. - Plan: CBC with Differential/Platelet, Uric Acid, predniSONE (DELTASONE) 10 MG tablet  Other iron deficiency anemia -Stable -h/o symptomatic anemia requiring transfusions in the past. -EGD from 12/26/2020 with large hiatal hernia, nonbleeding angioplastic lesion treated with APC -Patient strongly encouraged to continue iron supplements and increase intake of iron rich foods -Follow-up with GI strongly  encouraged.  F/u as needed  [name], MD

## 2021-06-17 NOTE — Patient Instructions (Addendum)
A prescription for your acid medication Pantoprazole (protonix) was sent to your pharmacy on 05/29/21.  Call the pharmacy to pick this up if you have not already done so.  You should expect some phone calls regarding the referrals.

## 2021-06-18 ENCOUNTER — Encounter: Payer: Self-pay | Admitting: Family Medicine

## 2021-06-18 ENCOUNTER — Telehealth: Payer: Self-pay | Admitting: Family Medicine

## 2021-06-18 MED ORDER — PREDNISONE 10 MG PO TABS: ORAL_TABLET | ORAL | 0 refills | Status: DC

## 2021-06-18 NOTE — Telephone Encounter (Signed)
Pt brother is [name] is calling and he is on DPR and would like his brother blood work results

## 2021-06-18 NOTE — Telephone Encounter (Signed)
Ok

## 2021-06-18 NOTE — Telephone Encounter (Signed)
Okay 

## 2021-06-19 NOTE — Telephone Encounter (Signed)
See result note.  

## 2021-06-19 NOTE — Telephone Encounter (Signed)
Spoke with Carra, gave VO per Dr Volanda Napoleon. Carra asked if pt has been in recently, because pt has not been hoe to get wound care in over a week.

## 2021-06-22 DIAGNOSIS — T24211D Burn of second degree of right thigh, subsequent encounter: Secondary | ICD-10-CM | POA: Diagnosis not present

## 2021-06-22 DIAGNOSIS — X062XXD Exposure to ignition of other clothing and apparel, subsequent encounter: Secondary | ICD-10-CM | POA: Diagnosis not present

## 2021-06-22 DIAGNOSIS — K559 Vascular disorder of intestine, unspecified: Secondary | ICD-10-CM | POA: Diagnosis not present

## 2021-06-22 DIAGNOSIS — D509 Iron deficiency anemia, unspecified: Secondary | ICD-10-CM | POA: Diagnosis not present

## 2021-06-22 DIAGNOSIS — I69151 Hemiplegia and hemiparesis following nontraumatic intracerebral hemorrhage affecting right dominant side: Secondary | ICD-10-CM | POA: Diagnosis not present

## 2021-06-22 DIAGNOSIS — F17210 Nicotine dependence, cigarettes, uncomplicated: Secondary | ICD-10-CM | POA: Diagnosis not present

## 2021-06-22 DIAGNOSIS — I10 Essential (primary) hypertension: Secondary | ICD-10-CM | POA: Diagnosis not present

## 2021-06-22 DIAGNOSIS — I720 Aneurysm of carotid artery: Secondary | ICD-10-CM | POA: Diagnosis not present

## 2021-06-22 DIAGNOSIS — K2100 Gastro-esophageal reflux disease with esophagitis, without bleeding: Secondary | ICD-10-CM | POA: Diagnosis not present

## 2021-06-24 DIAGNOSIS — I720 Aneurysm of carotid artery: Secondary | ICD-10-CM | POA: Diagnosis not present

## 2021-06-24 DIAGNOSIS — D509 Iron deficiency anemia, unspecified: Secondary | ICD-10-CM | POA: Diagnosis not present

## 2021-06-24 DIAGNOSIS — F17210 Nicotine dependence, cigarettes, uncomplicated: Secondary | ICD-10-CM | POA: Diagnosis not present

## 2021-06-24 DIAGNOSIS — X062XXD Exposure to ignition of other clothing and apparel, subsequent encounter: Secondary | ICD-10-CM | POA: Diagnosis not present

## 2021-06-24 DIAGNOSIS — I69151 Hemiplegia and hemiparesis following nontraumatic intracerebral hemorrhage affecting right dominant side: Secondary | ICD-10-CM | POA: Diagnosis not present

## 2021-06-24 DIAGNOSIS — K559 Vascular disorder of intestine, unspecified: Secondary | ICD-10-CM | POA: Diagnosis not present

## 2021-06-24 DIAGNOSIS — T24211D Burn of second degree of right thigh, subsequent encounter: Secondary | ICD-10-CM | POA: Diagnosis not present

## 2021-06-24 DIAGNOSIS — K2100 Gastro-esophageal reflux disease with esophagitis, without bleeding: Secondary | ICD-10-CM | POA: Diagnosis not present

## 2021-06-24 DIAGNOSIS — I10 Essential (primary) hypertension: Secondary | ICD-10-CM | POA: Diagnosis not present

## 2021-06-25 DIAGNOSIS — I69151 Hemiplegia and hemiparesis following nontraumatic intracerebral hemorrhage affecting right dominant side: Secondary | ICD-10-CM | POA: Diagnosis not present

## 2021-06-25 DIAGNOSIS — K2100 Gastro-esophageal reflux disease with esophagitis, without bleeding: Secondary | ICD-10-CM | POA: Diagnosis not present

## 2021-06-25 DIAGNOSIS — F17210 Nicotine dependence, cigarettes, uncomplicated: Secondary | ICD-10-CM | POA: Diagnosis not present

## 2021-06-25 DIAGNOSIS — I720 Aneurysm of carotid artery: Secondary | ICD-10-CM | POA: Diagnosis not present

## 2021-06-25 DIAGNOSIS — D509 Iron deficiency anemia, unspecified: Secondary | ICD-10-CM | POA: Diagnosis not present

## 2021-06-25 DIAGNOSIS — K559 Vascular disorder of intestine, unspecified: Secondary | ICD-10-CM | POA: Diagnosis not present

## 2021-06-25 DIAGNOSIS — X062XXD Exposure to ignition of other clothing and apparel, subsequent encounter: Secondary | ICD-10-CM | POA: Diagnosis not present

## 2021-06-25 DIAGNOSIS — T24211D Burn of second degree of right thigh, subsequent encounter: Secondary | ICD-10-CM | POA: Diagnosis not present

## 2021-06-25 DIAGNOSIS — I10 Essential (primary) hypertension: Secondary | ICD-10-CM | POA: Diagnosis not present

## 2021-06-25 NOTE — Telephone Encounter (Signed)
Pt recently seen but wheelchair and or needed repairs not discussed.  Not sure why this provider needs to fill out any for regarding this.  If it needs to be repaired should be ok to repair it.

## 2021-06-25 NOTE — Telephone Encounter (Signed)
Katie from [employer] called to follow up on paperwork that was faxed on 11/17. I let her know that the message was routed back to Dr.Banks but she was unavailable at the moment. Katie provided numbers for both her and [name], but [name] is out of the office right now.    Joellen Jersey can be reached at [phone]   Cynthia's number is  [phone]

## 2021-06-29 DIAGNOSIS — I69151 Hemiplegia and hemiparesis following nontraumatic intracerebral hemorrhage affecting right dominant side: Secondary | ICD-10-CM | POA: Diagnosis not present

## 2021-06-29 DIAGNOSIS — K2100 Gastro-esophageal reflux disease with esophagitis, without bleeding: Secondary | ICD-10-CM | POA: Diagnosis not present

## 2021-06-29 DIAGNOSIS — K559 Vascular disorder of intestine, unspecified: Secondary | ICD-10-CM | POA: Diagnosis not present

## 2021-06-29 DIAGNOSIS — I10 Essential (primary) hypertension: Secondary | ICD-10-CM | POA: Diagnosis not present

## 2021-06-29 DIAGNOSIS — D509 Iron deficiency anemia, unspecified: Secondary | ICD-10-CM | POA: Diagnosis not present

## 2021-06-29 DIAGNOSIS — T24211D Burn of second degree of right thigh, subsequent encounter: Secondary | ICD-10-CM | POA: Diagnosis not present

## 2021-06-29 DIAGNOSIS — F17210 Nicotine dependence, cigarettes, uncomplicated: Secondary | ICD-10-CM | POA: Diagnosis not present

## 2021-06-29 DIAGNOSIS — X062XXD Exposure to ignition of other clothing and apparel, subsequent encounter: Secondary | ICD-10-CM | POA: Diagnosis not present

## 2021-06-29 DIAGNOSIS — I720 Aneurysm of carotid artery: Secondary | ICD-10-CM | POA: Diagnosis not present

## 2021-06-30 DIAGNOSIS — X062XXD Exposure to ignition of other clothing and apparel, subsequent encounter: Secondary | ICD-10-CM | POA: Diagnosis not present

## 2021-06-30 DIAGNOSIS — T24211D Burn of second degree of right thigh, subsequent encounter: Secondary | ICD-10-CM | POA: Diagnosis not present

## 2021-06-30 DIAGNOSIS — K2100 Gastro-esophageal reflux disease with esophagitis, without bleeding: Secondary | ICD-10-CM | POA: Diagnosis not present

## 2021-06-30 DIAGNOSIS — F17210 Nicotine dependence, cigarettes, uncomplicated: Secondary | ICD-10-CM | POA: Diagnosis not present

## 2021-06-30 DIAGNOSIS — I720 Aneurysm of carotid artery: Secondary | ICD-10-CM | POA: Diagnosis not present

## 2021-06-30 DIAGNOSIS — I10 Essential (primary) hypertension: Secondary | ICD-10-CM | POA: Diagnosis not present

## 2021-06-30 DIAGNOSIS — D509 Iron deficiency anemia, unspecified: Secondary | ICD-10-CM | POA: Diagnosis not present

## 2021-06-30 DIAGNOSIS — I69151 Hemiplegia and hemiparesis following nontraumatic intracerebral hemorrhage affecting right dominant side: Secondary | ICD-10-CM | POA: Diagnosis not present

## 2021-06-30 DIAGNOSIS — K559 Vascular disorder of intestine, unspecified: Secondary | ICD-10-CM | POA: Diagnosis not present

## 2021-07-01 ENCOUNTER — Ambulatory Visit: Payer: Medicare HMO | Admitting: Family Medicine

## 2021-07-02 DIAGNOSIS — I10 Essential (primary) hypertension: Secondary | ICD-10-CM | POA: Diagnosis not present

## 2021-07-02 DIAGNOSIS — D509 Iron deficiency anemia, unspecified: Secondary | ICD-10-CM | POA: Diagnosis not present

## 2021-07-02 DIAGNOSIS — K559 Vascular disorder of intestine, unspecified: Secondary | ICD-10-CM | POA: Diagnosis not present

## 2021-07-02 DIAGNOSIS — I69151 Hemiplegia and hemiparesis following nontraumatic intracerebral hemorrhage affecting right dominant side: Secondary | ICD-10-CM | POA: Diagnosis not present

## 2021-07-02 DIAGNOSIS — I720 Aneurysm of carotid artery: Secondary | ICD-10-CM | POA: Diagnosis not present

## 2021-07-02 DIAGNOSIS — F17210 Nicotine dependence, cigarettes, uncomplicated: Secondary | ICD-10-CM | POA: Diagnosis not present

## 2021-07-02 DIAGNOSIS — T24211D Burn of second degree of right thigh, subsequent encounter: Secondary | ICD-10-CM | POA: Diagnosis not present

## 2021-07-02 DIAGNOSIS — K2100 Gastro-esophageal reflux disease with esophagitis, without bleeding: Secondary | ICD-10-CM | POA: Diagnosis not present

## 2021-07-02 DIAGNOSIS — X062XXD Exposure to ignition of other clothing and apparel, subsequent encounter: Secondary | ICD-10-CM | POA: Diagnosis not present

## 2021-07-06 DIAGNOSIS — K2100 Gastro-esophageal reflux disease with esophagitis, without bleeding: Secondary | ICD-10-CM | POA: Diagnosis not present

## 2021-07-06 DIAGNOSIS — T24211D Burn of second degree of right thigh, subsequent encounter: Secondary | ICD-10-CM | POA: Diagnosis not present

## 2021-07-06 DIAGNOSIS — I10 Essential (primary) hypertension: Secondary | ICD-10-CM | POA: Diagnosis not present

## 2021-07-06 DIAGNOSIS — D509 Iron deficiency anemia, unspecified: Secondary | ICD-10-CM | POA: Diagnosis not present

## 2021-07-06 DIAGNOSIS — I720 Aneurysm of carotid artery: Secondary | ICD-10-CM | POA: Diagnosis not present

## 2021-07-06 DIAGNOSIS — X062XXD Exposure to ignition of other clothing and apparel, subsequent encounter: Secondary | ICD-10-CM | POA: Diagnosis not present

## 2021-07-06 DIAGNOSIS — I69151 Hemiplegia and hemiparesis following nontraumatic intracerebral hemorrhage affecting right dominant side: Secondary | ICD-10-CM | POA: Diagnosis not present

## 2021-07-06 DIAGNOSIS — F17210 Nicotine dependence, cigarettes, uncomplicated: Secondary | ICD-10-CM | POA: Diagnosis not present

## 2021-07-06 DIAGNOSIS — K559 Vascular disorder of intestine, unspecified: Secondary | ICD-10-CM | POA: Diagnosis not present

## 2021-07-08 DIAGNOSIS — I69151 Hemiplegia and hemiparesis following nontraumatic intracerebral hemorrhage affecting right dominant side: Secondary | ICD-10-CM | POA: Diagnosis not present

## 2021-07-08 DIAGNOSIS — T24211D Burn of second degree of right thigh, subsequent encounter: Secondary | ICD-10-CM | POA: Diagnosis not present

## 2021-07-08 DIAGNOSIS — I10 Essential (primary) hypertension: Secondary | ICD-10-CM | POA: Diagnosis not present

## 2021-07-08 DIAGNOSIS — K559 Vascular disorder of intestine, unspecified: Secondary | ICD-10-CM | POA: Diagnosis not present

## 2021-07-08 DIAGNOSIS — D509 Iron deficiency anemia, unspecified: Secondary | ICD-10-CM | POA: Diagnosis not present

## 2021-07-08 DIAGNOSIS — I720 Aneurysm of carotid artery: Secondary | ICD-10-CM | POA: Diagnosis not present

## 2021-07-08 DIAGNOSIS — K2100 Gastro-esophageal reflux disease with esophagitis, without bleeding: Secondary | ICD-10-CM | POA: Diagnosis not present

## 2021-07-08 DIAGNOSIS — X062XXD Exposure to ignition of other clothing and apparel, subsequent encounter: Secondary | ICD-10-CM | POA: Diagnosis not present

## 2021-07-08 DIAGNOSIS — F17210 Nicotine dependence, cigarettes, uncomplicated: Secondary | ICD-10-CM | POA: Diagnosis not present

## 2021-07-08 IMAGING — DX DG CHEST 2V
2 series · 2 of 2 positions shown · non-contrast
Comparison: Prior chest radiographs 01/03/2017 and earlier

CLINICAL DATA: Fall, syncope. Additional history provided: Patient
with history of hypertension, smoking, stroke, GERD, hep C.

EXAM:
CHEST - 2 VIEW

[chest lat]
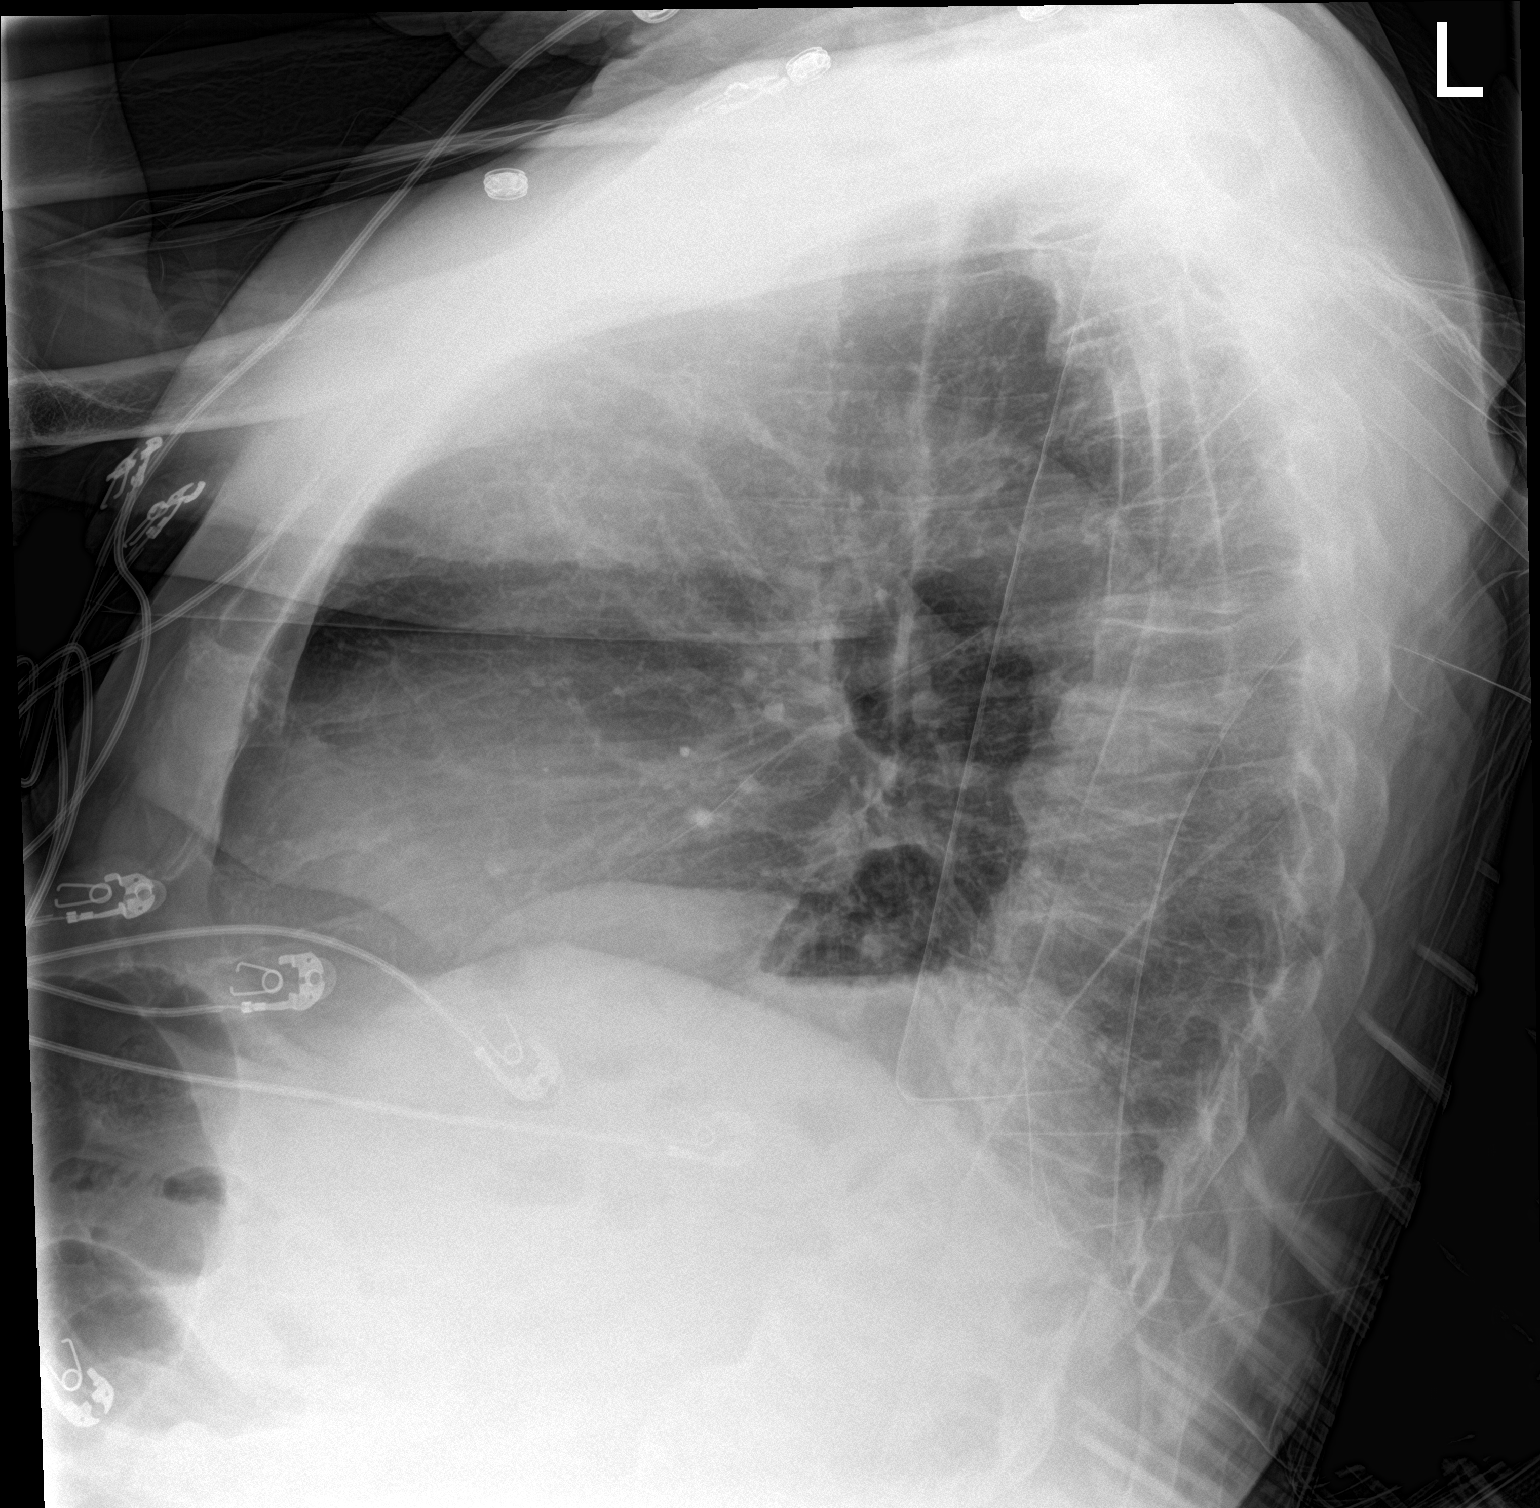

[chest ap]
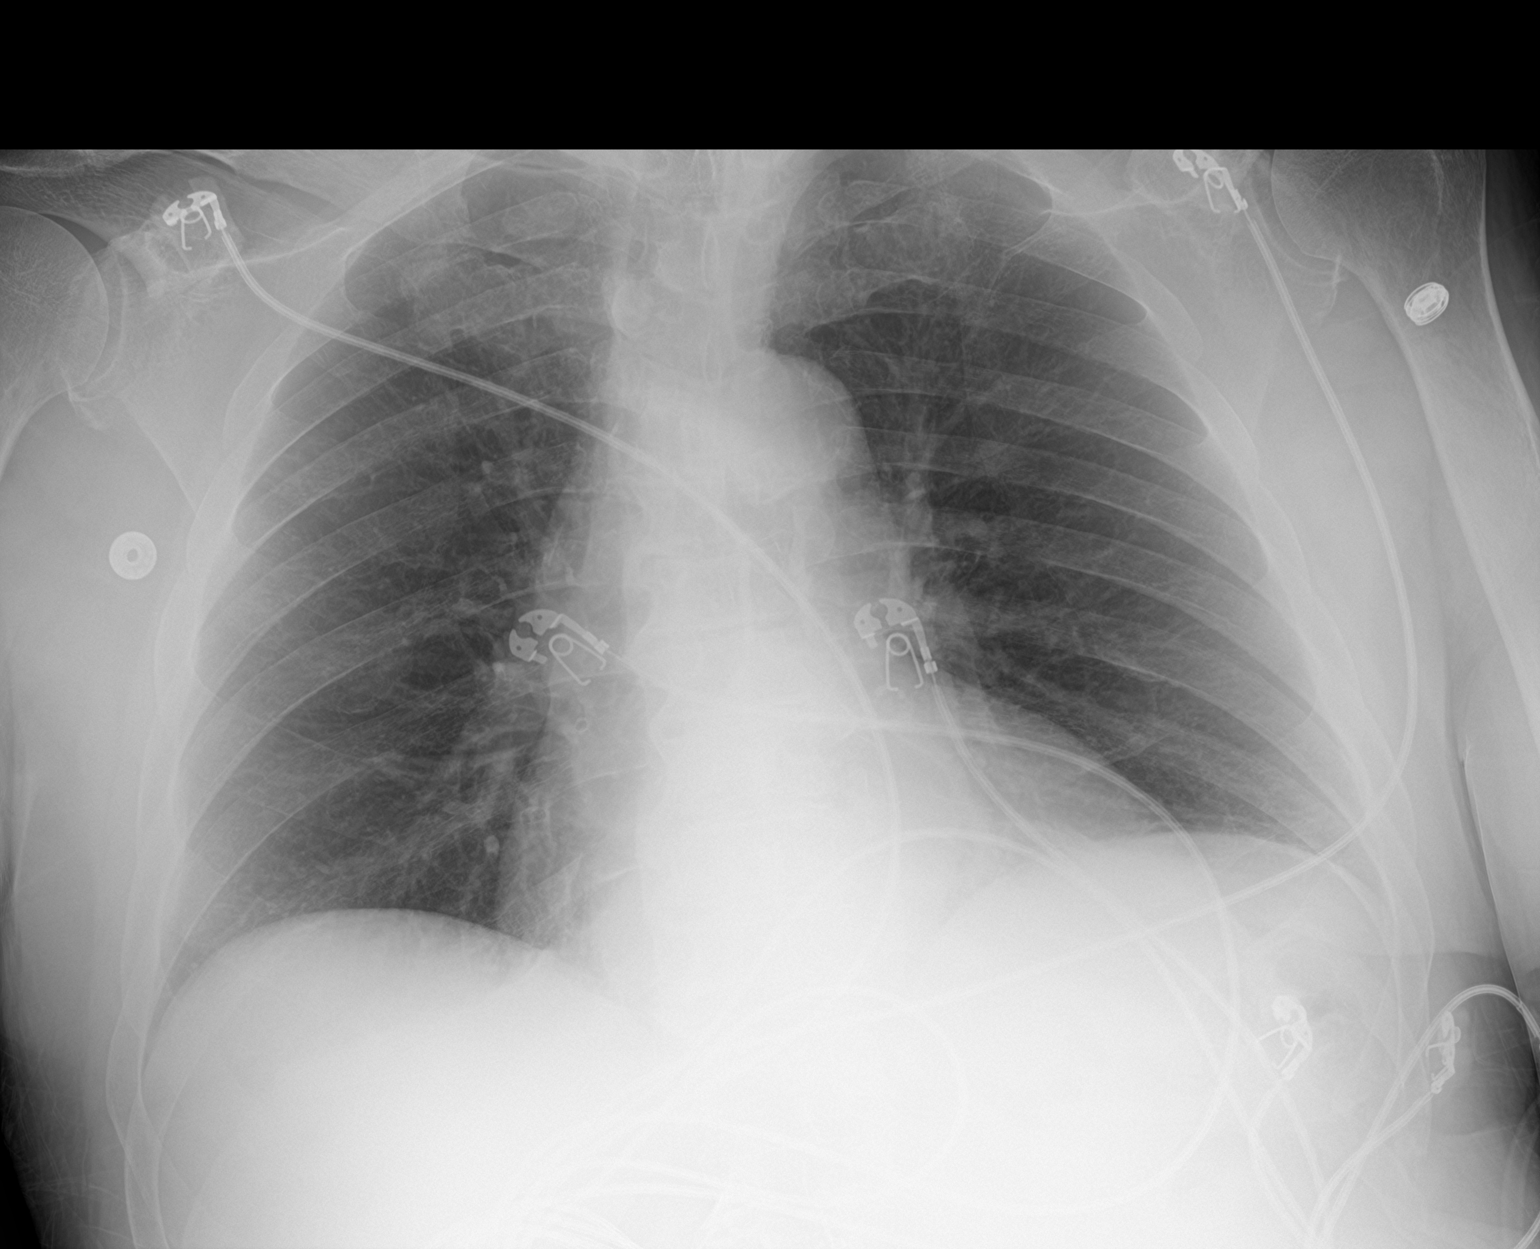

[2 of 2 positions shown; findings below may reference images not displayed]

FINDINGS: Heart size at the upper limits of normal, unchanged. No appreciable
airspace consolidation or pulmonary edema. No evidence of pleural
effusion or pneumothorax. Redemonstrated hiatal hernia. No acute
bony abnormality identified.
IMPRESSION: No evidence of acute cardiopulmonary abnormality.

Redemonstrated hiatal hernia.

## 2021-07-08 IMAGING — CT CT CERVICAL SPINE W/O CM
3 of 4 series · 13 of 33 positions shown, 16 images · non-contrast
Comparison: CT angiogram head/neck 07/21/2018, head CT 07/21/2018,
brain MRI 05/30/2015.

CLINICAL DATA: Dizziness, nonspecific. Neck trauma. Additional
history provided: Fall

EXAM:
CT HEAD WITHOUT CONTRAST
CT CERVICAL SPINE WITHOUT CONTRAST
TECHNIQUE: Multidetector CT imaging of the head and cervical spine was
performed following the standard protocol without intravenous
contrast. Multiplanar CT image reconstructions of the cervical spine
were also generated.

[Series 8: sag bone · sagittal · 0.33mm/px · 5 of 61 slices shown, 6 images]
[im 21/61  bone]
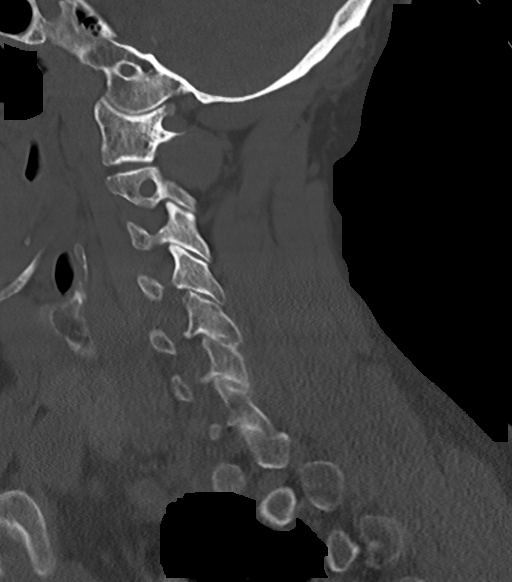
[im 26/61  bone]
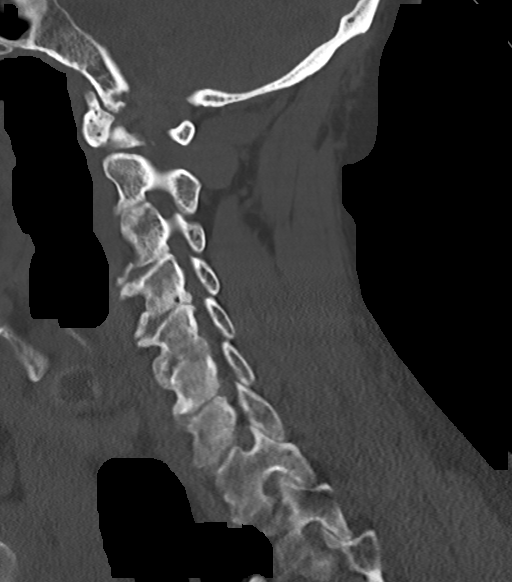
[im 31/61  soft-tissue]
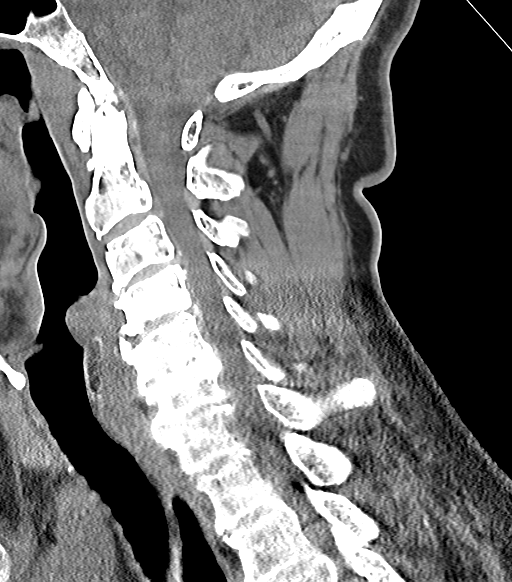
[im 31/61  bone]
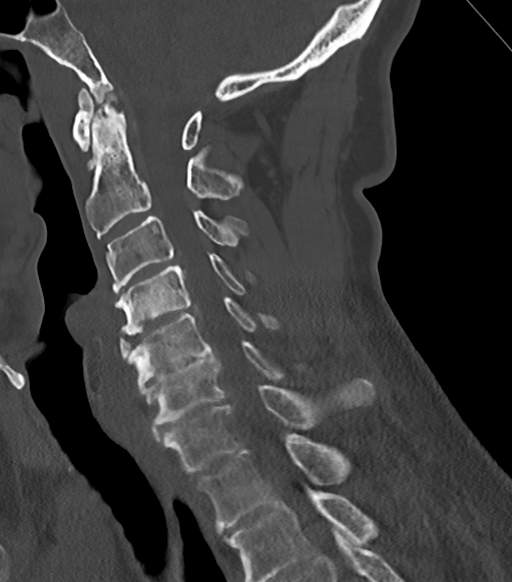
[im 36/61  bone]
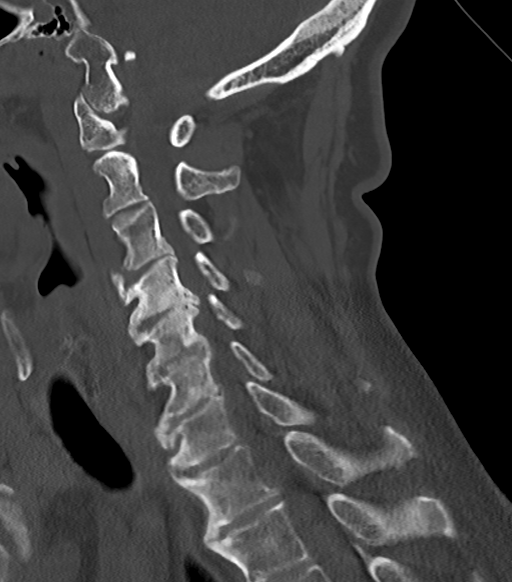
[im 41/61  bone]
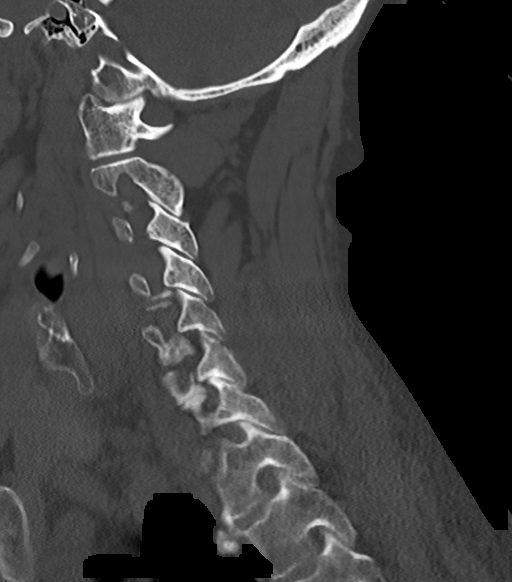

[Series 9: cor bone · coronal · 0.28mm/px · 3 of 63 slices shown]
[im 13/63  bone]
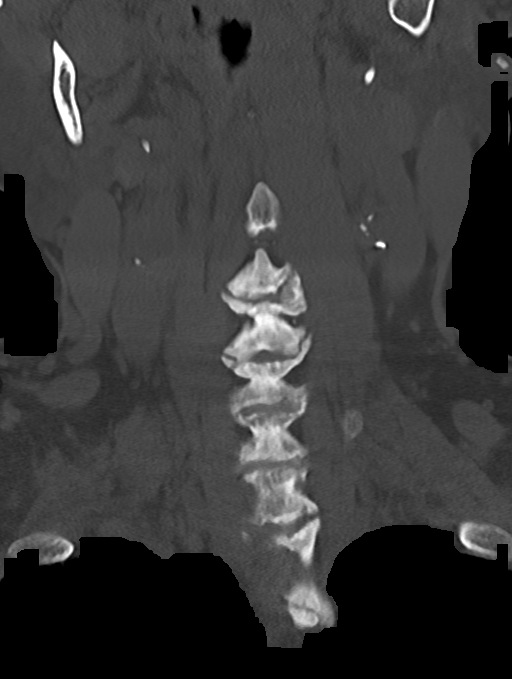
[im 25/63  bone]
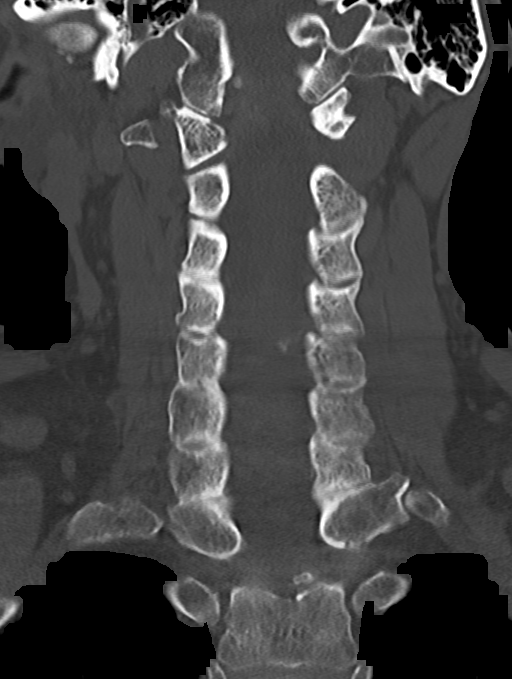
[im 38/63  bone]
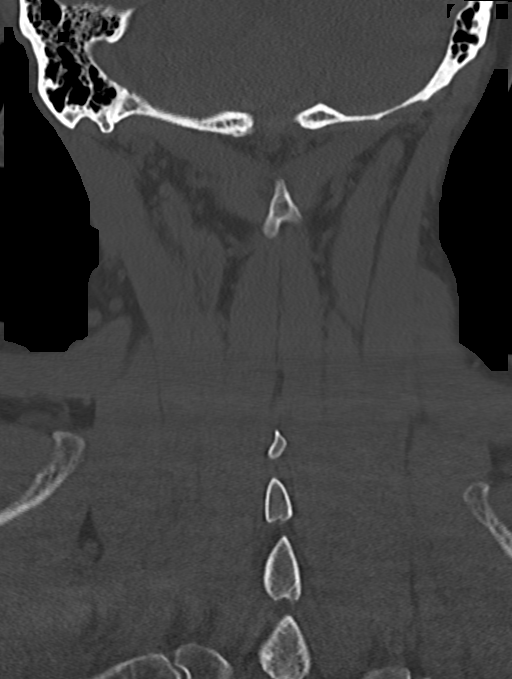

[Series 10: orthogonal axials · axial · 0.21mm/px · z∈[-85,+33]mm · 5 of 94 slices shown, 7 images]
[im 16/94  soft-tissue]
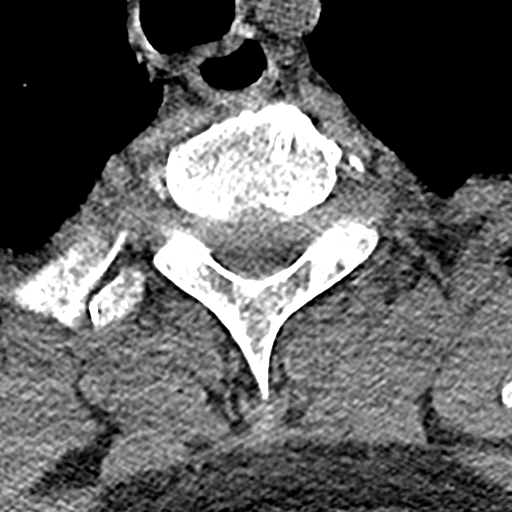
[im 16/94  bone]
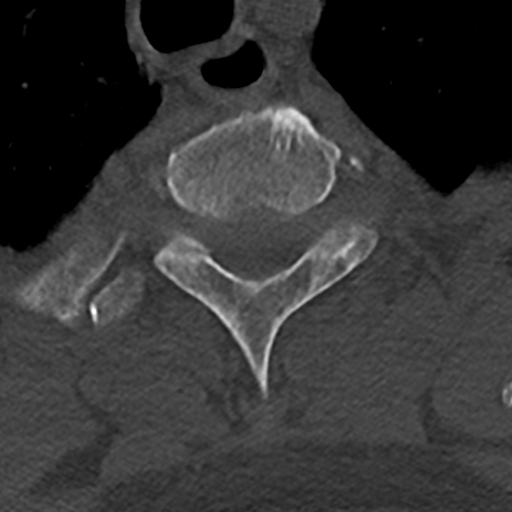
[im 32/94  bone]
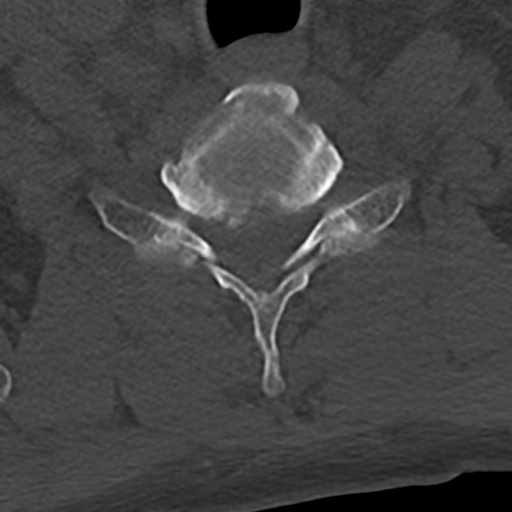
[im 47/94  bone]
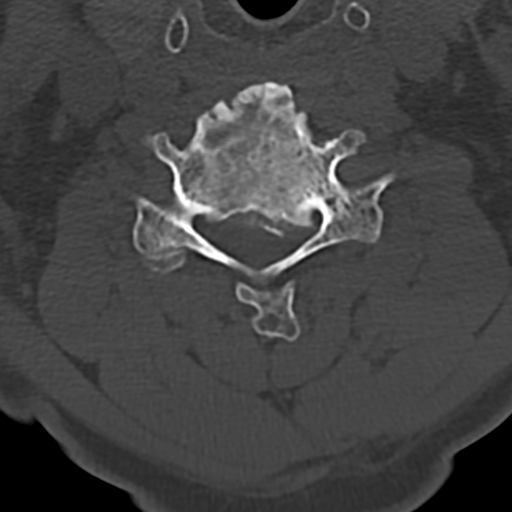
[im 63/94  bone]
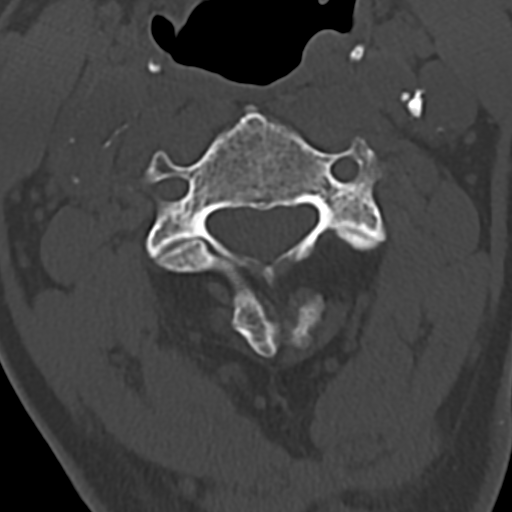
[im 78/94  soft-tissue]
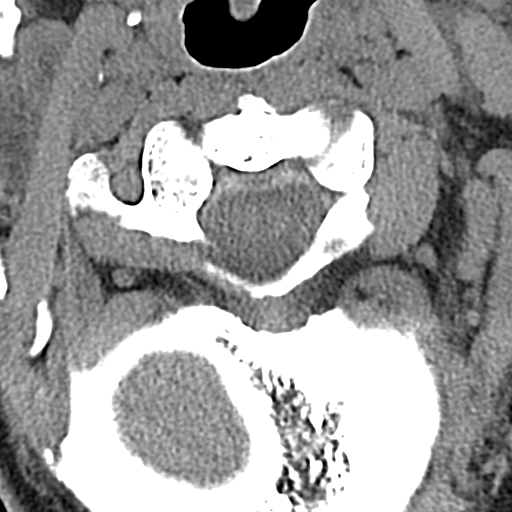
[im 78/94  bone]
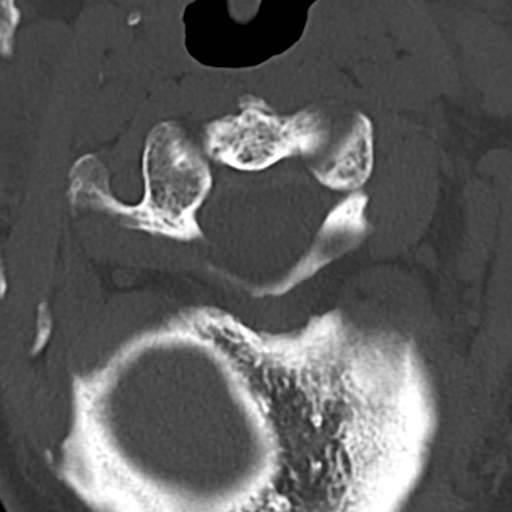

[13 of 33 positions shown; findings below may reference images not displayed]

FINDINGS: CT HEAD FINDINGS

Brain:

Mildly motion degraded exam.

Stable, mild generalized parenchymal atrophy.

Small cavity at site of previous left thalamic hemorrhage.

Redemonstrated remote lacunar infarcts within the right pons and
left cerebellum.

Stable mild patchy and ill-defined hypoattenuation within the
cerebral white matter which is nonspecific, but consistent with
chronic small vessel ischemic disease.

There is no acute intracranial hemorrhage.

No demarcated cortical infarct.

No extra-axial fluid collection.

No evidence of intracranial mass.

No midline shift.

Vascular: No hyperdense vessel.  Atherosclerotic calcifications.

Skull: Normal. Negative for fracture or focal lesion.

Sinuses/Orbits: Visualized orbits show no acute finding. Mild
ethmoid sinus mucosal thickening. No significant mastoid effusion.

CT CERVICAL SPINE FINDINGS

Alignment: Straightening of the expected cervical lordosis. No
significant spondylolisthesis.

Skull base and vertebrae: The basion-dental and atlanto-dental
intervals are maintained.No evidence of acute fracture to the
cervical spine.

Soft tissues and spinal canal: No prevertebral fluid or swelling. No
visible canal hematoma.

Disc levels: Cervical spondylosis with multilevel disc space
narrowing, posterior disc osteophytes, uncovertebral and facet
hypertrophy. Of note, there is degenerative fusion across the C5-C6
disc space. Disc space narrowing is advanced at C4-C5 and C6-C7. A
prominent C4-C5 posterior disc osteophyte complex contributes to
moderate/severe spinal canal stenosis. A prominent posterior disc
osteophyte complex at C5-C6 contributes to at least moderate spinal
canal stenosis. Multilevel bony neural foraminal narrowing.

Upper chest: No consolidation within the imaged lung apices. No
visible pneumothorax.

Other: 1.3 cm right thyroid lobe nodule not meeting consensus
criteria for ultrasound follow-up. Calcified plaque within the
carotid arteries bilaterally.
IMPRESSION: CT head:

1. Mildly motion degraded exam.
2. No evidence of acute intracranial abnormality.
3. Chronic sequela of prior left thalamic hemorrhage.
4. Redemonstrated chronic lacunar infarcts within the paramedian
right pons and left cerebellum.
5. Stable background mild generalized parenchymal atrophy and
chronic small vessel ischemic disease.
6. Mild ethmoid sinus mucosal thickening.

CT cervical spine:

1. No evidence of acute fracture to the cervical spine.
2. Cervical spondylosis as outlined. This includes degenerative
fusion across the C5-C6 disc space. Also of note, a prominent C4-C5
posterior disc osteophyte complex contributes to moderate/severe
spinal canal stenosis. A prominent C5-C6 posterior disc osteophyte
complex contributes to at least moderate spinal canal stenosis.
Multilevel bony neural foraminal narrowing.

## 2021-07-09 DIAGNOSIS — F17210 Nicotine dependence, cigarettes, uncomplicated: Secondary | ICD-10-CM | POA: Diagnosis not present

## 2021-07-09 DIAGNOSIS — D509 Iron deficiency anemia, unspecified: Secondary | ICD-10-CM | POA: Diagnosis not present

## 2021-07-09 DIAGNOSIS — T24211D Burn of second degree of right thigh, subsequent encounter: Secondary | ICD-10-CM | POA: Diagnosis not present

## 2021-07-09 DIAGNOSIS — I720 Aneurysm of carotid artery: Secondary | ICD-10-CM | POA: Diagnosis not present

## 2021-07-09 DIAGNOSIS — K559 Vascular disorder of intestine, unspecified: Secondary | ICD-10-CM | POA: Diagnosis not present

## 2021-07-09 DIAGNOSIS — K2100 Gastro-esophageal reflux disease with esophagitis, without bleeding: Secondary | ICD-10-CM | POA: Diagnosis not present

## 2021-07-09 DIAGNOSIS — I69151 Hemiplegia and hemiparesis following nontraumatic intracerebral hemorrhage affecting right dominant side: Secondary | ICD-10-CM | POA: Diagnosis not present

## 2021-07-09 DIAGNOSIS — X062XXD Exposure to ignition of other clothing and apparel, subsequent encounter: Secondary | ICD-10-CM | POA: Diagnosis not present

## 2021-07-09 DIAGNOSIS — I10 Essential (primary) hypertension: Secondary | ICD-10-CM | POA: Diagnosis not present

## 2021-07-15 ENCOUNTER — Encounter: Payer: Medicare HMO | Attending: Physical Medicine & Rehabilitation | Admitting: Physical Medicine & Rehabilitation

## 2021-07-15 ENCOUNTER — Other Ambulatory Visit: Payer: Self-pay

## 2021-07-15 ENCOUNTER — Encounter: Payer: Self-pay | Admitting: Physical Medicine & Rehabilitation

## 2021-07-15 VITALS — BP 153/78 | HR 85 | Temp 98.1°F | Ht 69.5 in

## 2021-07-15 DIAGNOSIS — I619 Nontraumatic intracerebral hemorrhage, unspecified: Secondary | ICD-10-CM | POA: Diagnosis not present

## 2021-07-15 DIAGNOSIS — I69151 Hemiplegia and hemiparesis following nontraumatic intracerebral hemorrhage affecting right dominant side: Secondary | ICD-10-CM

## 2021-07-15 DIAGNOSIS — X062XXD Exposure to ignition of other clothing and apparel, subsequent encounter: Secondary | ICD-10-CM | POA: Diagnosis not present

## 2021-07-15 DIAGNOSIS — I720 Aneurysm of carotid artery: Secondary | ICD-10-CM | POA: Diagnosis not present

## 2021-07-15 DIAGNOSIS — K559 Vascular disorder of intestine, unspecified: Secondary | ICD-10-CM | POA: Diagnosis not present

## 2021-07-15 DIAGNOSIS — D509 Iron deficiency anemia, unspecified: Secondary | ICD-10-CM | POA: Diagnosis not present

## 2021-07-15 DIAGNOSIS — I10 Essential (primary) hypertension: Secondary | ICD-10-CM | POA: Diagnosis not present

## 2021-07-15 DIAGNOSIS — K2100 Gastro-esophageal reflux disease with esophagitis, without bleeding: Secondary | ICD-10-CM | POA: Diagnosis not present

## 2021-07-15 DIAGNOSIS — F17210 Nicotine dependence, cigarettes, uncomplicated: Secondary | ICD-10-CM | POA: Diagnosis not present

## 2021-07-15 DIAGNOSIS — T24211D Burn of second degree of right thigh, subsequent encounter: Secondary | ICD-10-CM | POA: Diagnosis not present

## 2021-07-15 NOTE — Patient Instructions (Addendum)
CONTINUE THERAPY AS LONG AS YOU CAN GET IT  ALSO , OUTSIDE OF THERAPY TRY TO WORK ON REGULAR STANDING AT HOME (AT THE SINK OR TABLE), EXERCISE BANDS, GET A LIST EXERCISES FROM THERAPY TO DO.

## 2021-07-15 NOTE — Progress Notes (Signed)
Subjective:    Patient ID: [name], male    DOB: Jun 11, 1951, 70 y.o.   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 I last saw him in August 2021. He has aides at home who help with his personal hygiene and activities. They walk him within the home. He walks 20-40 feet at a time. He is able to transfer himself at home.   He is having pain in both of his legs from hips down. He describes it as aching and throbbing. Dr. Elease Hashimoto prescribes hydrocodone for his pain. He also takes ibuprofen and gabapentin for this pain.  He denies pain in his back  His bowels and bladder are working fairly normally.   Pain Inventory6 Average Pain  Pain Right Now 5 My pain is intermittent and just pain in both legs  LOCATION OF PAIN  both legs  BOWEL Number of stools per week: 3  BLADDER Normal  Mobility walk with assistance use a walker how many minutes can you walk? 15 seconds ability to climb steps?  no do you drive?  no use a wheelchair transfers alone Do you have any goals in this area?  yes  Function disabled: date disabled 2019 retired I need assistance with the following:  dressing, bathing, toileting, meal prep, household duties, and shopping Do you have any goals in this area?  yes  Neuro/Psych weakness trouble walking  Prior Studies Any changes since last visit?  no  Physicians involved in your care Any changes since last visit?  no   Family History  Problem Relation Age of Onset   Alzheimer's disease Father    Cancer Mother        Lymph node   Heart failure Brother 71       Transplant 11 years ago   CAD Brother    Heart disease Sister 65       MI   Social History   Socioeconomic History   Marital status: Legally Separated    Spouse name: Not on file   Number of children: 2   Years of education: Not on file   Highest education level: Not on file  Occupational History    Occupation: retired  Tobacco Use   Smoking status: Every Day    Packs/day: 0.50    Years: 50.00    Pack years: 25.00    Types: Cigarettes   Smokeless tobacco: Never  Vaping Use   Vaping Use: Former  Substance and Sexual Activity   Alcohol use: Yes    Alcohol/week: 0.0 standard drinks    Comment: occasionally   Drug use: Not Currently    Frequency: 5.0 times per week    Types: Marijuana    Comment: "as often as I can get it"   Sexual activity: Yes  Other Topics Concern   Not on file  Social History Narrative   One living child .  One murdered.  Lives with girlfriend.     Social Determinants of Health   Financial Resource Strain: Low Risk    Difficulty of Paying Living Expenses: Not hard at all  Food Insecurity: No Food Insecurity   Worried About Charity fundraiser in the Last Year: Never true   [place] in the Last Year: Never true  Transportation Needs: No Transportation Needs   Lack of Transportation (Medical): No   Lack of Transportation (Non-Medical): No  Physical Activity:  Inactive   Days of Exercise per Week: 0 days   Minutes of Exercise per Session: 0 min  Stress: No Stress Concern Present   Feeling of Stress : Not at all  Social Connections: Socially Isolated   Frequency of Communication with Friends and Family: Once a week   Frequency of Social Gatherings with Friends and Family: Never   Attends Religious Services: Never   Marine scientist or Organizations: No   Attends Archivist Meetings: Never   Marital Status: Separated   Past Surgical History:  Procedure Laterality Date   APPENDECTOMY     BIOPSY  01/11/2018   Procedure: BIOPSY;  Surgeon: Gatha Mayer, MD;  Location: [facility];  Service: Endoscopy;;   COLONOSCOPY     COLONOSCOPY N/A 10/26/2015   Procedure: COLONOSCOPY;  Surgeon: Doran Stabler, MD;  Location: [facility];  Service: Endoscopy;  Laterality: N/A;   ESOPHAGOGASTRODUODENOSCOPY     ESOPHAGOGASTRODUODENOSCOPY  N/A 10/26/2015   Procedure: ESOPHAGOGASTRODUODENOSCOPY (EGD);  Surgeon: Doran Stabler, MD;  Location: [facility] ENDOSCOPY;  Service: Endoscopy;  Laterality: N/A;   ESOPHAGOGASTRODUODENOSCOPY (EGD) WITH PROPOFOL N/A 01/11/2018   Procedure: ESOPHAGOGASTRODUODENOSCOPY (EGD) WITH PROPOFOL;  Surgeon: Gatha Mayer, MD;  Location: [facility] ENDOSCOPY;  Service: Endoscopy;  Laterality: N/A;   ESOPHAGOGASTRODUODENOSCOPY (EGD) WITH PROPOFOL N/A 12/26/2020   Procedure: ESOPHAGOGASTRODUODENOSCOPY (EGD) WITH PROPOFOL;  Surgeon: Ladene Artist, MD;  Location: [facility] ENDOSCOPY;  Service: Endoscopy;  Laterality: N/A;   HOT HEMOSTASIS N/A 12/26/2020   Procedure: HOT HEMOSTASIS (ARGON PLASMA COAGULATION/BICAP);  Surgeon: Ladene Artist, MD;  Location: [facility] ENDOSCOPY;  Service: Endoscopy;  Laterality: N/A;   INGUINAL HERNIA REPAIR Right 11/04/2014   Procedure: RIGHT INGUINAL HERNIA REPAIR WITH MESH;  Surgeon: Jackolyn Confer, MD;  Location: [place];  Service: General;  Laterality: Right;   MASS EXCISION N/A 11/04/2014   Procedure: REMOVAL OF RIGHT GROIN SOFT TISSUE MASS;  Surgeon: Jackolyn Confer, MD;  Location: [place];  Service: General;  Laterality: N/A;   Multiple abdominal surgeries     total of 13   PROSTATECTOMY     SMALL INTESTINE SURGERY     Past Medical History:  Diagnosis Date   Arthritis    [name] ulcer, chronic 01/11/2018   Chronic back pain    GERD (gastroesophageal reflux disease)    uses Baking Soda   GIB (gastrointestinal bleeding) 2012   Glaucoma    right eye   Headache    occasionally   Hepatitis C    Hiatal hernia with gastroesophageal reflux disease and esophagitis 01/05/2018   History of blood transfusion    no abnormal reaction noted   History of gout    doesn't take any meds   Hyperlipidemia    not on any meds   Insomnia    takes Ambien nightly   Ischemic colitis ([place]) 2012   Joint pain    Nocturia    Numbness    both legs occasionally   PONV (postoperative nausea and vomiting)     Prostate cancer (HCC)    Shortness of breath dyspnea    rarely but when notices he can be lying/sitting/exertion.Dr.Hochrein is aware per pt   Stroke ([place]) 2016    Urinary frequency    Urinary urgency    BP (!) 153/78    Pulse 85    Temp 98.1 F (36.7 C)    Ht 5' 9.5" (1.765 m)    SpO2 96%    BMI 24.60 kg/m  Opioid Risk Score:   Fall Risk Score:  `1  Depression screen PHQ 2/9  Depression screen [facility] 2/9 10/01/2020 03/05/2020 12/25/2019 09/19/2019 09/19/2019 10/31/2018  Decreased Interest 2 0 - 1 1 0  Down, Depressed, Hopeless 2 0 1 2 2  0  PHQ - 2 Score 4 0 1 3 3  0  Altered sleeping 3 - 3 3 3  -  Tired, decreased energy 3 - 0 2 2 -  Change in appetite 3 - 3 3 3  -  Feeling bad or failure about yourself  1 - 1 1 1  -  Trouble concentrating 3 - 1 1 1  -  Moving slowly or fidgety/restless 0 - 0 1 1 -  Suicidal thoughts 0 - 0 0 0 -  PHQ-9 Score 17 - 9 14 14  -  Difficult doing work/chores Somewhat difficult - - Somewhat difficult Somewhat difficult -  Some recent data might be hidden    Review of Systems  Musculoskeletal:  Positive for gait problem.  All other systems reviewed and are negative.     Objective:   Physical Exam  General: No acute distress HEENT: NCAT, EOMI, oral membranes moist Cards: reg rate  Chest: normal effort Abdomen: Soft, NT, ND Skin: abrasions and scabs LE's Extremities: 1+ LEedema Psych: pleasant and appropriate  Neuro: Pt is cognitively appropriate with normal insight, memory, and awareness. Cranial nerves 2-12 are intact. Sensory exam is normal except for hands. Reflexes are 2+ in all 4's. Fine motor coordination is intact. No tremors.  Motor functions 5 out of 5 in the upper extremities he remains   d 4- prox to 5/5 distally LE's  .Marland Kitchen  Musculoskeletal:  left shoulder tender with IR/ER and impingement maneuve.    Psych: Pt's affect is appropriate. Pt is cooperative           Assessment & Plan:  1.  Right hemiparesis, lower extremity weakness, cognitive  deficits secondary to left thalamic hemorrhage.             -CONTINUE HH PT       -needs to work on [place] standing at home on his own with aids and family, daily exercises 2.   History of chronic back pain/headaches/left shoulder painpain Management: Tylenol as needed for now-        -  3. Mood: Wellbutrin    4. HTN:  per primary    5.  Acute on chronic blood loss anemia/HH with gastritis:              per outpt GI/primary                  -hgb was 7.9 at ED -f/u with primary   6. Chronic Insomnia :overall better             -continue Seroquel at 200 mg at bedtime             -melatonin OTC  previously recommended    -Continue Wellbutrin in the morning 7.  Tobacco dependence:              - continue Wellbutrin  100 mg twice daily 8. Anosmia  -likely stroke related  -tobacco history  -iatrogenic/age related    Fifteen minutes of face to face patient care time were spent during this visit. All questions were encouraged and answered.  Follow up with me prn .

## 2021-07-16 ENCOUNTER — Other Ambulatory Visit: Payer: Self-pay | Admitting: Family Medicine

## 2021-07-16 DIAGNOSIS — I720 Aneurysm of carotid artery: Secondary | ICD-10-CM | POA: Diagnosis not present

## 2021-07-16 DIAGNOSIS — I10 Essential (primary) hypertension: Secondary | ICD-10-CM | POA: Diagnosis not present

## 2021-07-16 DIAGNOSIS — I69151 Hemiplegia and hemiparesis following nontraumatic intracerebral hemorrhage affecting right dominant side: Secondary | ICD-10-CM | POA: Diagnosis not present

## 2021-07-16 DIAGNOSIS — D509 Iron deficiency anemia, unspecified: Secondary | ICD-10-CM | POA: Diagnosis not present

## 2021-07-16 DIAGNOSIS — K2100 Gastro-esophageal reflux disease with esophagitis, without bleeding: Secondary | ICD-10-CM | POA: Diagnosis not present

## 2021-07-16 DIAGNOSIS — X062XXD Exposure to ignition of other clothing and apparel, subsequent encounter: Secondary | ICD-10-CM | POA: Diagnosis not present

## 2021-07-16 DIAGNOSIS — T24211D Burn of second degree of right thigh, subsequent encounter: Secondary | ICD-10-CM | POA: Diagnosis not present

## 2021-07-16 DIAGNOSIS — F17210 Nicotine dependence, cigarettes, uncomplicated: Secondary | ICD-10-CM | POA: Diagnosis not present

## 2021-07-16 DIAGNOSIS — K559 Vascular disorder of intestine, unspecified: Secondary | ICD-10-CM | POA: Diagnosis not present

## 2021-07-21 DIAGNOSIS — T24211D Burn of second degree of right thigh, subsequent encounter: Secondary | ICD-10-CM | POA: Diagnosis not present

## 2021-07-21 DIAGNOSIS — K559 Vascular disorder of intestine, unspecified: Secondary | ICD-10-CM | POA: Diagnosis not present

## 2021-07-21 DIAGNOSIS — I69151 Hemiplegia and hemiparesis following nontraumatic intracerebral hemorrhage affecting right dominant side: Secondary | ICD-10-CM | POA: Diagnosis not present

## 2021-07-21 DIAGNOSIS — F17210 Nicotine dependence, cigarettes, uncomplicated: Secondary | ICD-10-CM | POA: Diagnosis not present

## 2021-07-21 DIAGNOSIS — I720 Aneurysm of carotid artery: Secondary | ICD-10-CM | POA: Diagnosis not present

## 2021-07-21 DIAGNOSIS — K2100 Gastro-esophageal reflux disease with esophagitis, without bleeding: Secondary | ICD-10-CM | POA: Diagnosis not present

## 2021-07-21 DIAGNOSIS — I10 Essential (primary) hypertension: Secondary | ICD-10-CM | POA: Diagnosis not present

## 2021-07-21 DIAGNOSIS — D509 Iron deficiency anemia, unspecified: Secondary | ICD-10-CM | POA: Diagnosis not present

## 2021-07-21 DIAGNOSIS — X062XXD Exposure to ignition of other clothing and apparel, subsequent encounter: Secondary | ICD-10-CM | POA: Diagnosis not present

## 2021-07-23 DIAGNOSIS — I720 Aneurysm of carotid artery: Secondary | ICD-10-CM | POA: Diagnosis not present

## 2021-07-23 DIAGNOSIS — K2100 Gastro-esophageal reflux disease with esophagitis, without bleeding: Secondary | ICD-10-CM | POA: Diagnosis not present

## 2021-07-23 DIAGNOSIS — F17210 Nicotine dependence, cigarettes, uncomplicated: Secondary | ICD-10-CM | POA: Diagnosis not present

## 2021-07-23 DIAGNOSIS — I10 Essential (primary) hypertension: Secondary | ICD-10-CM | POA: Diagnosis not present

## 2021-07-23 DIAGNOSIS — T24211D Burn of second degree of right thigh, subsequent encounter: Secondary | ICD-10-CM | POA: Diagnosis not present

## 2021-07-23 DIAGNOSIS — K559 Vascular disorder of intestine, unspecified: Secondary | ICD-10-CM | POA: Diagnosis not present

## 2021-07-23 DIAGNOSIS — X062XXD Exposure to ignition of other clothing and apparel, subsequent encounter: Secondary | ICD-10-CM | POA: Diagnosis not present

## 2021-07-23 DIAGNOSIS — D509 Iron deficiency anemia, unspecified: Secondary | ICD-10-CM | POA: Diagnosis not present

## 2021-07-23 DIAGNOSIS — I69151 Hemiplegia and hemiparesis following nontraumatic intracerebral hemorrhage affecting right dominant side: Secondary | ICD-10-CM | POA: Diagnosis not present

## 2021-07-27 DIAGNOSIS — K2100 Gastro-esophageal reflux disease with esophagitis, without bleeding: Secondary | ICD-10-CM | POA: Diagnosis not present

## 2021-07-27 DIAGNOSIS — T24211D Burn of second degree of right thigh, subsequent encounter: Secondary | ICD-10-CM | POA: Diagnosis not present

## 2021-07-27 DIAGNOSIS — K559 Vascular disorder of intestine, unspecified: Secondary | ICD-10-CM | POA: Diagnosis not present

## 2021-07-27 DIAGNOSIS — X062XXD Exposure to ignition of other clothing and apparel, subsequent encounter: Secondary | ICD-10-CM | POA: Diagnosis not present

## 2021-07-27 DIAGNOSIS — D509 Iron deficiency anemia, unspecified: Secondary | ICD-10-CM | POA: Diagnosis not present

## 2021-07-27 DIAGNOSIS — I10 Essential (primary) hypertension: Secondary | ICD-10-CM | POA: Diagnosis not present

## 2021-07-27 DIAGNOSIS — I720 Aneurysm of carotid artery: Secondary | ICD-10-CM | POA: Diagnosis not present

## 2021-07-27 DIAGNOSIS — I69151 Hemiplegia and hemiparesis following nontraumatic intracerebral hemorrhage affecting right dominant side: Secondary | ICD-10-CM | POA: Diagnosis not present

## 2021-07-27 DIAGNOSIS — F17210 Nicotine dependence, cigarettes, uncomplicated: Secondary | ICD-10-CM | POA: Diagnosis not present

## 2021-07-30 DIAGNOSIS — T24211D Burn of second degree of right thigh, subsequent encounter: Secondary | ICD-10-CM | POA: Diagnosis not present

## 2021-07-30 DIAGNOSIS — X062XXD Exposure to ignition of other clothing and apparel, subsequent encounter: Secondary | ICD-10-CM | POA: Diagnosis not present

## 2021-07-30 DIAGNOSIS — F17210 Nicotine dependence, cigarettes, uncomplicated: Secondary | ICD-10-CM | POA: Diagnosis not present

## 2021-07-30 DIAGNOSIS — I10 Essential (primary) hypertension: Secondary | ICD-10-CM | POA: Diagnosis not present

## 2021-07-30 DIAGNOSIS — I69151 Hemiplegia and hemiparesis following nontraumatic intracerebral hemorrhage affecting right dominant side: Secondary | ICD-10-CM | POA: Diagnosis not present

## 2021-07-30 DIAGNOSIS — I720 Aneurysm of carotid artery: Secondary | ICD-10-CM | POA: Diagnosis not present

## 2021-07-30 DIAGNOSIS — D509 Iron deficiency anemia, unspecified: Secondary | ICD-10-CM | POA: Diagnosis not present

## 2021-07-30 DIAGNOSIS — K559 Vascular disorder of intestine, unspecified: Secondary | ICD-10-CM | POA: Diagnosis not present

## 2021-07-30 DIAGNOSIS — K2100 Gastro-esophageal reflux disease with esophagitis, without bleeding: Secondary | ICD-10-CM | POA: Diagnosis not present

## 2021-08-03 DIAGNOSIS — K2100 Gastro-esophageal reflux disease with esophagitis, without bleeding: Secondary | ICD-10-CM | POA: Diagnosis not present

## 2021-08-03 DIAGNOSIS — K559 Vascular disorder of intestine, unspecified: Secondary | ICD-10-CM | POA: Diagnosis not present

## 2021-08-03 DIAGNOSIS — I720 Aneurysm of carotid artery: Secondary | ICD-10-CM | POA: Diagnosis not present

## 2021-08-03 DIAGNOSIS — T24211D Burn of second degree of right thigh, subsequent encounter: Secondary | ICD-10-CM | POA: Diagnosis not present

## 2021-08-03 DIAGNOSIS — I69151 Hemiplegia and hemiparesis following nontraumatic intracerebral hemorrhage affecting right dominant side: Secondary | ICD-10-CM | POA: Diagnosis not present

## 2021-08-03 DIAGNOSIS — I10 Essential (primary) hypertension: Secondary | ICD-10-CM | POA: Diagnosis not present

## 2021-08-03 DIAGNOSIS — X062XXD Exposure to ignition of other clothing and apparel, subsequent encounter: Secondary | ICD-10-CM | POA: Diagnosis not present

## 2021-08-03 DIAGNOSIS — F17210 Nicotine dependence, cigarettes, uncomplicated: Secondary | ICD-10-CM | POA: Diagnosis not present

## 2021-08-03 DIAGNOSIS — D509 Iron deficiency anemia, unspecified: Secondary | ICD-10-CM | POA: Diagnosis not present

## 2021-08-04 DIAGNOSIS — T24211D Burn of second degree of right thigh, subsequent encounter: Secondary | ICD-10-CM | POA: Diagnosis not present

## 2021-08-04 DIAGNOSIS — I69151 Hemiplegia and hemiparesis following nontraumatic intracerebral hemorrhage affecting right dominant side: Secondary | ICD-10-CM | POA: Diagnosis not present

## 2021-08-04 DIAGNOSIS — D509 Iron deficiency anemia, unspecified: Secondary | ICD-10-CM | POA: Diagnosis not present

## 2021-08-04 DIAGNOSIS — K559 Vascular disorder of intestine, unspecified: Secondary | ICD-10-CM | POA: Diagnosis not present

## 2021-08-04 DIAGNOSIS — I10 Essential (primary) hypertension: Secondary | ICD-10-CM | POA: Diagnosis not present

## 2021-08-04 DIAGNOSIS — K2100 Gastro-esophageal reflux disease with esophagitis, without bleeding: Secondary | ICD-10-CM | POA: Diagnosis not present

## 2021-08-04 DIAGNOSIS — I720 Aneurysm of carotid artery: Secondary | ICD-10-CM | POA: Diagnosis not present

## 2021-08-04 DIAGNOSIS — X062XXD Exposure to ignition of other clothing and apparel, subsequent encounter: Secondary | ICD-10-CM | POA: Diagnosis not present

## 2021-08-04 DIAGNOSIS — F17210 Nicotine dependence, cigarettes, uncomplicated: Secondary | ICD-10-CM | POA: Diagnosis not present

## 2021-08-06 ENCOUNTER — Ambulatory Visit (INDEPENDENT_AMBULATORY_CARE_PROVIDER_SITE_OTHER): Payer: Medicare HMO | Admitting: Family Medicine

## 2021-08-06 ENCOUNTER — Encounter: Payer: Self-pay | Admitting: Family Medicine

## 2021-08-06 ENCOUNTER — Other Ambulatory Visit (HOSPITAL_COMMUNITY)
Admission: RE | Admit: 2021-08-06 | Discharge: 2021-08-06 | Disposition: A | Discharge status: Home / Self Care | Payer: Medicare HMO | Source: Ambulatory Visit | Attending: Family Medicine | Admitting: Family Medicine

## 2021-08-06 VITALS — BP 136/88 | HR 80 | Temp 99.0°F

## 2021-08-06 DIAGNOSIS — M7021 Olecranon bursitis, right elbow: Secondary | ICD-10-CM | POA: Insufficient documentation

## 2021-08-06 DIAGNOSIS — R481 Agnosia: Secondary | ICD-10-CM | POA: Diagnosis not present

## 2021-08-06 NOTE — Progress Notes (Signed)
Subjective:    Patient ID: [name], male    DOB: [birth year], 71 y.o.   MRN: [id number]  Chief Complaint  Patient presents with   Mass    Still has know on right arm    HPI Patient was seen today for ongoing concern.  Pt with continued edema of R elbow x 2 months.  States area has not gone down.  Only painful if hits it on something.  Denies fever, chills, erythema, or drainage.  Able to bend arm without issue.  Past Medical History:  Diagnosis Date   Arthritis    [name] ulcer, chronic 01/11/2018   Chronic back pain    GERD (gastroesophageal reflux disease)    uses Baking Soda   GIB (gastrointestinal bleeding) 2012   Glaucoma    right eye   Headache    occasionally   Hepatitis C    Hiatal hernia with gastroesophageal reflux disease and esophagitis 01/05/2018   History of blood transfusion    no abnormal reaction noted   History of gout    doesn't take any meds   Hyperlipidemia    not on any meds   Insomnia    takes Ambien nightly   Ischemic colitis ([name]) 2012   Joint pain    Nocturia    Numbness    both legs occasionally   PONV (postoperative nausea and vomiting)    Prostate cancer (HCC)    Shortness of breath dyspnea    rarely but when notices he can be lying/sitting/exertion.Dr.Hochrein is aware per pt   Stroke [facility]) 2016    Urinary frequency    Urinary urgency     Allergies  Allergen Reactions   Penicillins Anaphylaxis    Has patient had a PCN reaction causing immediate rash, facial/tongue/throat swelling, SOB or lightheadedness with hypotension: Yes Has patient had a PCN reaction causing severe rash involving mucus membranes or skin necrosis: No Has patient had a PCN reaction that required hospitalization: No Has patient had a PCN reaction occurring within the last 10 years: No If all of the above answers are "NO", then may proceed with Cephalosporin use.[name] [Pseudoephedrine] Palpitations and Other (See Comments)    Heart "races"     ROS General: Denies fever, chills, night sweats, changes in weight, changes in appetite HEENT: Denies headaches, ear pain, changes in vision, rhinorrhea, sore throat +loss of smell and taste-chronic CV: Denies CP, palpitations, SOB, orthopnea Pulm: Denies SOB, cough, wheezing GI: Denies abdominal pain, nausea, vomiting, diarrhea, constipation GU: Denies dysuria, hematuria, frequency, vaginal discharge Msk: Denies muscle cramps, joint pains   Neuro: Denies weakness, numbness, tingling Skin: Denies rashes, bruising +R elbow bursitis Psych: Denies depression, anxiety, hallucinations     Objective:    Blood pressure 136/88, pulse 80, temperature 99 F (37.2 C), temperature source Oral, SpO2 98 %.  Gen. Pleasant, well-nourished, in no distress, normal affect   HEENT: [name]/AT, face symmetric, conjunctiva clear, no scleral icterus, PERRLA, EOMI, nares patent without drainage Lungs: no accessory muscle use, CTAB, no wheezes or rales Cardiovascular: RRR, no m/r/g, no peripheral edema Musculoskeletal: R elbow with moderate sized edema of posterior surface.  FROM right UE.  No cyanosis or clubbing, normal tone Neuro:  A&Ox3, CN II-XII intact, in power wheelchair Skin:  Warm, no dry, intact.  Moderate sized olecranon bursa edema with mildly increased warmth, no induration.  Bursa aspirated, serosanguinous fluid obtained.    Wt Readings from Last 3 Encounters:  03/17/21 169 lb (76.7 kg)  12/25/20 173 lb 1 oz (78.5 kg)  07/30/20 173 lb (78.5 kg)    Lab Results  Component Value Date   WBC 5.6 06/17/2021   HGB 9.3 (L) 06/17/2021   HCT 30.5 (L) 06/17/2021   PLT 494.0 (H) 06/17/2021   GLUCOSE 89 06/17/2021   CHOL 108 06/17/2021   TRIG 104.0 06/17/2021   HDL 39.80 06/17/2021   LDLCALC 47 06/17/2021   ALT 9 06/17/2021   AST 8 06/17/2021   NA 140 06/17/2021   K 4.2 06/17/2021   CL 105 06/17/2021   CREATININE 1.02 06/17/2021   BUN 16 06/17/2021   CO2 29 06/17/2021   TSH 1.01  12/24/2020   INR 1.4 (H) 12/25/2020   HGBA1C 4.7 (L) 07/22/2018   After consent was obtained, using sterile technique the R elbow was prepped.  The olecranon bursa was entered and 10 ml's of serosanguinous colored fluid was withdrawn and sent for cell count and culture.  A small amount of fluid remaining bursa as patient jerked away, dislodging needle from elbow.   Steroid was not introduced.   Bandage applied to right elbow with pressure dressing.  The procedure was tolerated.  Watch for fever, or increased swelling or persistent pain in knee. Call or return to clinic prn if such symptoms occur.  Assessment/Plan:  Olecranon bursitis of right elbow  -Consent obtained.  Right elbow bursa aspirated.  Patient tolerated procedure fairly well considering needle phobia. -Supportive care including ice, Tylenol. - Plan: Culture, aspirate-quantitative, Synovial cell count + diff, w/ crystals, Gram stain  Agnosia for taste -Appears last ENT referral closed.  We will try to reopen.  Patient's brother would like to be contacted in regards to scheduling.  Patient okay with this.  Agnosia for smell -As above  F/u prn  More than 50% of over 33 minutes spent in total in caring for this patient was spent face-to-face, reviewing the chart, counseling and/or coordinating care.   Jonathon Mitts, MD

## 2021-08-06 NOTE — Patient Instructions (Signed)
Keep the wrapping on your elbow until tomorrow around noon.  Can you can remove it.  You can take Tylenol if needed for any discomfort.  We will send the fluid obtained from your elbow to the lab to be evaluated.  The office is checking to reopen your ENT referral and will contact her brother regarding scheduling this appointment.

## 2021-08-07 DIAGNOSIS — I69151 Hemiplegia and hemiparesis following nontraumatic intracerebral hemorrhage affecting right dominant side: Secondary | ICD-10-CM | POA: Diagnosis not present

## 2021-08-07 DIAGNOSIS — X062XXD Exposure to ignition of other clothing and apparel, subsequent encounter: Secondary | ICD-10-CM | POA: Diagnosis not present

## 2021-08-07 DIAGNOSIS — D509 Iron deficiency anemia, unspecified: Secondary | ICD-10-CM | POA: Diagnosis not present

## 2021-08-07 DIAGNOSIS — K559 Vascular disorder of intestine, unspecified: Secondary | ICD-10-CM | POA: Diagnosis not present

## 2021-08-07 DIAGNOSIS — I10 Essential (primary) hypertension: Secondary | ICD-10-CM | POA: Diagnosis not present

## 2021-08-07 DIAGNOSIS — K2100 Gastro-esophageal reflux disease with esophagitis, without bleeding: Secondary | ICD-10-CM | POA: Diagnosis not present

## 2021-08-07 DIAGNOSIS — T24211D Burn of second degree of right thigh, subsequent encounter: Secondary | ICD-10-CM | POA: Diagnosis not present

## 2021-08-07 DIAGNOSIS — F17210 Nicotine dependence, cigarettes, uncomplicated: Secondary | ICD-10-CM | POA: Diagnosis not present

## 2021-08-07 DIAGNOSIS — I720 Aneurysm of carotid artery: Secondary | ICD-10-CM | POA: Diagnosis not present

## 2021-08-07 LAB — SYNOVIAL CELL COUNT + DIFF, W/ CRYSTALS
Crystals, Fluid: NONE SEEN
Eosinophils-Synovial: 0 % (ref 0–1)
Lymphocytes-Synovial Fld: 88 % — ABNORMAL HIGH (ref 0–20)
Monocyte-Macrophage-Synovial Fluid: 4 % — ABNORMAL LOW (ref 50–90)
Neutrophil, Synovial: 8 % (ref 0–25)
WBC, Synovial: 39 /mm3 (ref 0–200)

## 2021-08-10 DIAGNOSIS — F17210 Nicotine dependence, cigarettes, uncomplicated: Secondary | ICD-10-CM | POA: Diagnosis not present

## 2021-08-10 DIAGNOSIS — D509 Iron deficiency anemia, unspecified: Secondary | ICD-10-CM | POA: Diagnosis not present

## 2021-08-10 DIAGNOSIS — K559 Vascular disorder of intestine, unspecified: Secondary | ICD-10-CM | POA: Diagnosis not present

## 2021-08-10 DIAGNOSIS — I69151 Hemiplegia and hemiparesis following nontraumatic intracerebral hemorrhage affecting right dominant side: Secondary | ICD-10-CM | POA: Diagnosis not present

## 2021-08-10 DIAGNOSIS — I10 Essential (primary) hypertension: Secondary | ICD-10-CM | POA: Diagnosis not present

## 2021-08-10 DIAGNOSIS — I720 Aneurysm of carotid artery: Secondary | ICD-10-CM | POA: Diagnosis not present

## 2021-08-10 DIAGNOSIS — X062XXD Exposure to ignition of other clothing and apparel, subsequent encounter: Secondary | ICD-10-CM | POA: Diagnosis not present

## 2021-08-10 DIAGNOSIS — T24211D Burn of second degree of right thigh, subsequent encounter: Secondary | ICD-10-CM | POA: Diagnosis not present

## 2021-08-10 DIAGNOSIS — K2100 Gastro-esophageal reflux disease with esophagitis, without bleeding: Secondary | ICD-10-CM | POA: Diagnosis not present

## 2021-08-10 LAB — BODY FLUID CULTURE W GRAM STAIN
Culture: NO GROWTH
Gram Stain: NONE SEEN

## 2021-08-11 ENCOUNTER — Telehealth: Payer: Self-pay | Admitting: Family Medicine

## 2021-08-11 DIAGNOSIS — I10 Essential (primary) hypertension: Secondary | ICD-10-CM | POA: Diagnosis not present

## 2021-08-11 DIAGNOSIS — K559 Vascular disorder of intestine, unspecified: Secondary | ICD-10-CM | POA: Diagnosis not present

## 2021-08-11 DIAGNOSIS — F17210 Nicotine dependence, cigarettes, uncomplicated: Secondary | ICD-10-CM | POA: Diagnosis not present

## 2021-08-11 DIAGNOSIS — D509 Iron deficiency anemia, unspecified: Secondary | ICD-10-CM | POA: Diagnosis not present

## 2021-08-11 DIAGNOSIS — K2100 Gastro-esophageal reflux disease with esophagitis, without bleeding: Secondary | ICD-10-CM | POA: Diagnosis not present

## 2021-08-11 DIAGNOSIS — I69151 Hemiplegia and hemiparesis following nontraumatic intracerebral hemorrhage affecting right dominant side: Secondary | ICD-10-CM | POA: Diagnosis not present

## 2021-08-11 DIAGNOSIS — I720 Aneurysm of carotid artery: Secondary | ICD-10-CM | POA: Diagnosis not present

## 2021-08-11 DIAGNOSIS — X062XXD Exposure to ignition of other clothing and apparel, subsequent encounter: Secondary | ICD-10-CM | POA: Diagnosis not present

## 2021-08-11 DIAGNOSIS — T24211D Burn of second degree of right thigh, subsequent encounter: Secondary | ICD-10-CM | POA: Diagnosis not present

## 2021-08-11 NOTE — Telephone Encounter (Signed)
[name], a physical therapist with [facility], called to get verbal orders for PT re-certification, frequency is one week nine.   Good callback number is [phone] [name] to leave voicemails        Please advise

## 2021-08-12 NOTE — Telephone Encounter (Signed)
Ok

## 2021-08-13 DIAGNOSIS — K2100 Gastro-esophageal reflux disease with esophagitis, without bleeding: Secondary | ICD-10-CM | POA: Diagnosis not present

## 2021-08-13 DIAGNOSIS — X062XXD Exposure to ignition of other clothing and apparel, subsequent encounter: Secondary | ICD-10-CM | POA: Diagnosis not present

## 2021-08-13 DIAGNOSIS — K559 Vascular disorder of intestine, unspecified: Secondary | ICD-10-CM | POA: Diagnosis not present

## 2021-08-13 DIAGNOSIS — F17210 Nicotine dependence, cigarettes, uncomplicated: Secondary | ICD-10-CM | POA: Diagnosis not present

## 2021-08-13 DIAGNOSIS — D509 Iron deficiency anemia, unspecified: Secondary | ICD-10-CM | POA: Diagnosis not present

## 2021-08-13 DIAGNOSIS — T24211D Burn of second degree of right thigh, subsequent encounter: Secondary | ICD-10-CM | POA: Diagnosis not present

## 2021-08-13 DIAGNOSIS — I720 Aneurysm of carotid artery: Secondary | ICD-10-CM | POA: Diagnosis not present

## 2021-08-13 DIAGNOSIS — I10 Essential (primary) hypertension: Secondary | ICD-10-CM | POA: Diagnosis not present

## 2021-08-13 DIAGNOSIS — I69151 Hemiplegia and hemiparesis following nontraumatic intracerebral hemorrhage affecting right dominant side: Secondary | ICD-10-CM | POA: Diagnosis not present

## 2021-08-13 NOTE — Telephone Encounter (Signed)
Vo given to [place].

## 2021-09-01 ENCOUNTER — Telehealth: Payer: Self-pay | Admitting: Family Medicine

## 2021-09-01 NOTE — Telephone Encounter (Signed)
Left message for patient to call back and schedule Medicare Annual Wellness Visit (AWV) either virtually or in office. Left  my [name] number [phone]   Last AWV ;10/01/20 please schedule at anytime with [facility] Nurse Health Advisor 1 or 2   This should be a 45 minute visit.   Awv can be schedule calendar year [place]

## 2021-09-03 ENCOUNTER — Telehealth: Payer: Self-pay | Admitting: Family Medicine

## 2021-09-03 NOTE — Chronic Care Management (AMB) (Signed)
°  Chronic Care Management   Outreach Note  09/03/2021 Name: [name] MRN: [id number] DOB: 1951-04-05  Referred by: Billie Ruddy, MD Reason for referral : No chief complaint on file.   An unsuccessful telephone outreach was attempted today. The patient was referred to the pharmacist for assistance with care management and care coordination.   Follow Up Plan:   Tatjana Dellinger Upstream Scheduler

## 2021-09-08 ENCOUNTER — Other Ambulatory Visit: Payer: Self-pay | Admitting: Family Medicine

## 2021-09-08 DIAGNOSIS — F419 Anxiety disorder, unspecified: Secondary | ICD-10-CM

## 2021-09-08 DIAGNOSIS — I10 Essential (primary) hypertension: Secondary | ICD-10-CM

## 2021-09-10 ENCOUNTER — Telehealth: Payer: Self-pay | Admitting: Family Medicine

## 2021-09-10 NOTE — Chronic Care Management (AMB) (Signed)
°  Chronic Care Management   Note  09/10/2021 Name: Dustyn Dansereau MRN: [id number] DOB: January 30, 1951  Zaylon Bossier Capuano is a 71 y.o. year old male who is a primary care patient of Billie Ruddy, MD. I reached out to [name] by phone today in response to a referral sent by Mr. Mihran Lebarron Balon's PCP, Billie Ruddy, MD.   Mr. Beehler was given information about Chronic Care Management services today including:  CCM service includes personalized support from designated clinical staff supervised by his physician, including individualized plan of care and coordination with other care providers 24/7 contact phone numbers for assistance for urgent and routine care needs. Service will only be billed when office clinical staff spend 20 minutes or more in a month to coordinate care. Only one practitioner may furnish and bill the service in a calendar month. The patient may stop CCM services at any time (effective at the end of the month) by phone call to the office staff.   BROTHER/ MILTON Nolasco verbally agreed to assistance and services provided by embedded care coordination/care management team today.  Follow up plan: AWARE MAY HAVE A COPAY WITH [employer]/ CARD WAS NOT [place]

## 2021-09-25 ENCOUNTER — Ambulatory Visit: Payer: Medicare HMO | Admitting: Family Medicine

## 2021-09-28 ENCOUNTER — Ambulatory Visit (INDEPENDENT_AMBULATORY_CARE_PROVIDER_SITE_OTHER): Payer: Medicare HMO | Admitting: Family Medicine

## 2021-09-28 ENCOUNTER — Encounter: Payer: Self-pay | Admitting: Family Medicine

## 2021-09-28 VITALS — BP 185/85 | HR 73 | Temp 98.7°F

## 2021-09-28 DIAGNOSIS — I69151 Hemiplegia and hemiparesis following nontraumatic intracerebral hemorrhage affecting right dominant side: Secondary | ICD-10-CM

## 2021-09-28 DIAGNOSIS — M25421 Effusion, right elbow: Secondary | ICD-10-CM | POA: Diagnosis not present

## 2021-09-28 DIAGNOSIS — K2100 Gastro-esophageal reflux disease with esophagitis, without bleeding: Secondary | ICD-10-CM | POA: Diagnosis not present

## 2021-09-28 DIAGNOSIS — R011 Cardiac murmur, unspecified: Secondary | ICD-10-CM | POA: Diagnosis not present

## 2021-09-28 DIAGNOSIS — I10 Essential (primary) hypertension: Secondary | ICD-10-CM

## 2021-09-28 DIAGNOSIS — K449 Diaphragmatic hernia without obstruction or gangrene: Secondary | ICD-10-CM | POA: Diagnosis not present

## 2021-09-28 DIAGNOSIS — Z9189 Other specified personal risk factors, not elsewhere classified: Secondary | ICD-10-CM | POA: Diagnosis not present

## 2021-09-28 DIAGNOSIS — Z8673 Personal history of transient ischemic attack (TIA), and cerebral infarction without residual deficits: Secondary | ICD-10-CM | POA: Diagnosis not present

## 2021-09-28 DIAGNOSIS — R600 Localized edema: Secondary | ICD-10-CM

## 2021-09-28 LAB — COMPREHENSIVE METABOLIC PANEL
ALT: 7 U/L (ref 0–53)
AST: 10 U/L (ref 0–37)
Albumin: 4.3 g/dL (ref 3.5–5.2)
Alkaline Phosphatase: 112 U/L (ref 39–117)
BUN: 10 mg/dL (ref 6–23)
CO2: 27 mEq/L (ref 19–32)
Calcium: 9.5 mg/dL (ref 8.4–10.5)
Chloride: 105 mEq/L (ref 96–112)
Creatinine, Ser: 0.92 mg/dL (ref 0.40–1.50)
GFR: 83.1 mL/min (ref >60.00)
Glucose, Bld: 91 mg/dL (ref 70–99)
Potassium: 4.1 mEq/L (ref 3.5–5.1)
Sodium: 141 mEq/L (ref 135–145)
Total Bilirubin: 0.3 mg/dL (ref 0.2–1.2)
Total Protein: 7.4 g/dL (ref 6.0–8.3)

## 2021-09-28 LAB — T4, FREE: Free T4: 0.83 ng/dL (ref 0.60–1.60)

## 2021-09-28 LAB — CBC WITH DIFFERENTIAL/PLATELET
Basophils Absolute: 0.1 10*3/uL (ref 0.0–0.1)
Basophils Relative: 1.3 % (ref 0.0–3.0)
Eosinophils Absolute: 0.1 10*3/uL (ref 0.0–0.7)
Eosinophils Relative: 1.8 % (ref 0.0–5.0)
HCT: 28 % — ABNORMAL LOW (ref 39.0–52.0)
Hemoglobin: 8.1 g/dL — ABNORMAL LOW (ref 13.0–17.0)
Lymphocytes Relative: 25.5 % (ref 12.0–46.0)
Lymphs Abs: 1.3 10*3/uL (ref 0.7–4.0)
MCHC: 28.8 g/dL — ABNORMAL LOW (ref 30.0–36.0)
MCV: 65.1 fl — ABNORMAL LOW (ref 78.0–100.0)
Monocytes Absolute: 0.5 10*3/uL (ref 0.1–1.0)
Monocytes Relative: 8.6 % (ref 3.0–12.0)
Neutro Abs: 3.3 10*3/uL (ref 1.4–7.7)
Neutrophils Relative %: 62.8 % (ref 43.0–77.0)
Platelets: 501 10*3/uL — ABNORMAL HIGH (ref 150.0–400.0)
RBC: 4.3 Mil/uL (ref 4.22–5.81)
RDW: 20 % — ABNORMAL HIGH (ref 11.5–15.5)
WBC: 5.3 10*3/uL (ref 4.0–10.5)

## 2021-09-28 LAB — PROTIME-INR
INR: 1.4 ratio — ABNORMAL HIGH (ref 0.8–1.0)
Prothrombin Time: 15 s — ABNORMAL HIGH (ref 9.6–13.1)

## 2021-09-28 LAB — TSH: TSH: 1.32 u[IU]/mL (ref 0.35–5.50)

## 2021-09-28 MED ORDER — POTASSIUM CHLORIDE CRYS ER 20 MEQ PO TBCR: 20.0000 meq | EXTENDED_RELEASE_TABLET | Freq: Two times a day (BID) | ORAL | 3 refills | Status: DC

## 2021-09-28 MED ORDER — CHLORTHALIDONE 25 MG PO TABS: 25.0000 mg | ORAL_TABLET | Freq: Every day | ORAL | 3 refills | Status: DC

## 2021-09-28 MED ORDER — FUROSEMIDE 20 MG PO TABS: 20.0000 mg | ORAL_TABLET | Freq: Two times a day (BID) | ORAL | 3 refills | Status: DC

## 2021-09-28 NOTE — Progress Notes (Signed)
Subjective:  ? ? Patient ID: [name], male    DOB: February 23, 1951, 71 y.o.   MRN: [id number] ? ?Chief Complaint  ?Patient presents with  ? Edema  ?  In both legs, and feet. Right  elbow, still swollen  ? Gastroesophageal Reflux  ?  Can not drink water with out having acid reflux really, for a while.  ? ? ?HPI ?Patient was seen today for f/u on chronic conditions and acute concerns.  Patient endorses continued heartburn even with borderline.  Taking baking soda and Tums.  Had prescription for Protonix but patient states he is not taking currently.  Referral was placed for gastroenterology, but patient states he has not heard about this.  Patient inquires if his noon phone number was contacted, [phone]. ? ?Patient endorses return of right elbow swelling. Joint is stiff at times 2/2 swelling.  Patient denies injury.  Elbow drained at last visit 08/06/2021. ? ?Pt with increased LE edema x 2 months.  Pt states he gets up and moves around someone at home.  When he is sitting pt endorses elevating LEs.  Patient denies increased sodium intake.  Patient states BP is elevated as he often forgets to take morning pill pack.  Patient denies SOB, CP, dizziness.  Typically uses motorized scooter s/p CVA.  Has appointment with ENT in a few weeks for loss of smell and taste x1 year +. ? ?Pt inquires about something for sleep.  States Seroquel no longer working.  Per chart review no recent refills on Seroquel 200 mg.  Patient states he sits up watching TV all night.  Endorses drinking several sodas daily. ? ?Past Medical History:  ?Diagnosis Date  ? Arthritis   ? [place] ulcer, chronic 01/11/2018  ? Chronic back pain   ? GERD (gastroesophageal reflux disease)   ? uses Baking Soda  ? GIB (gastrointestinal bleeding) 2012  ? Glaucoma   ? right eye  ? Headache   ? occasionally  ? Hepatitis C   ? Hiatal hernia with gastroesophageal reflux disease and esophagitis 01/05/2018  ? History of blood transfusion   ? no abnormal reaction  noted  ? History of gout   ? doesn't take any meds  ? Hyperlipidemia   ? not on any meds  ? Insomnia   ? takes Ambien nightly  ? Ischemic colitis ([place]) 2012  ? Joint pain   ? Nocturia   ? Numbness   ? both legs occasionally  ? PONV (postoperative nausea and vomiting)   ? Prostate cancer ([place])   ? Shortness of breath dyspnea   ? rarely but when notices he can be lying/sitting/exertion.Dr.Hochrein is aware per pt  ? Stroke [facility]) 2016   ? Urinary frequency   ? Urinary urgency   ? ? ?Allergies  ?Allergen Reactions  ? Penicillins Anaphylaxis  ?  Has patient had a PCN reaction causing immediate rash, facial/tongue/throat swelling, SOB or lightheadedness with hypotension: Yes ?Has patient had a PCN reaction causing severe rash involving mucus membranes or skin necrosis: No ?Has patient had a PCN reaction that required hospitalization: No ?Has patient had a PCN reaction occurring within the last 10 years: No ?If all of the above answers are "NO", then may proceed with Cephalosporin use..  ? Sudafed [Pseudoephedrine] Palpitations and Other (See Comments)  ?  Heart "races"  ? ? ?ROS ?General: Denies fever, chills, night sweats, changes in weight, changes in appetite + insomnia ?HEENT: Denies headaches, ear pain, changes in vision, rhinorrhea, sore throat ?  CV: Denies CP, palpitations, SOB, orthopnea  +LE edema ?Pulm: Denies SOB, cough, wheezing ?GI: Denies abdominal pain, nausea, vomiting, diarrhea, constipation +GERD ?GU: Denies dysuria, hematuria, frequency ?Msk: Denies muscle cramps, joint pains + right elbow edema ?Neuro: Denies weakness, numbness, tingling + hemiparesis s/p CVA ?Skin: Denies rashes, bruising ?Psych: Denies depression, anxiety, hallucinations ?   ?Objective:  ?  ?Blood pressure (!) 185/85, pulse 73, temperature 98.7 ?F (37.1 ?C), temperature source Oral, SpO2 100 %. ? ?Gen. Pleasant, well-nourished, in no distress, normal affect   ?HEENT: [place]/AT, face symmetric, conjunctiva clear, no scleral icterus,  PERRLA, EOMI, nares patent without drainage ?Lungs: no accessory muscle use, CTAB, no wheezes or rales ?Cardiovascular: RRR, 2/6 murmur best hear R upper chest, peripheral edema RLE>LLE.  RLE with 1+ at shin, LLE trace below knee.  Pedal edema, R>L.  No TTP of calf.  Negative Homans' sign bilaterally. ?Abdomen: BS present, soft, NT/ND ?Musculoskeletal: Moderate right elbow effusion. No deformities, no cyanosis or clubbing, normal tone ?Neuro:  A&Ox3, CN II-XII intact, sitting in motorized scooter ?Skin:  Warm, no lesions/ rash ? ? ?Wt Readings from Last 3 Encounters:  ?03/17/21 169 lb (76.7 kg)  ?12/25/20 173 lb 1 oz (78.5 kg)  ?07/30/20 173 lb (78.5 kg)  ? ? ?Lab Results  ?Component Value Date  ? WBC 5.6 06/17/2021  ? HGB 9.3 (L) 06/17/2021  ? HCT 30.5 (L) 06/17/2021  ? PLT 494.0 (H) 06/17/2021  ? GLUCOSE 89 06/17/2021  ? CHOL 108 06/17/2021  ? TRIG 104.0 06/17/2021  ? HDL 39.80 06/17/2021  ? [place] 47 06/17/2021  ? ALT 9 06/17/2021  ? AST 8 06/17/2021  ? NA 140 06/17/2021  ? K 4.2 06/17/2021  ? CL 105 06/17/2021  ? CREATININE 1.02 06/17/2021  ? BUN 16 06/17/2021  ? CO2 29 06/17/2021  ? TSH 1.01 12/24/2020  ? INR 1.4 (H) 12/25/2020  ? HGBA1C 4.7 (L) 07/22/2018  ? ? ?Assessment/Plan: ?Patient is a 71 year old male with several factors complicating care including compliance, financial issues, hemiaplasia and hemiparesis status post CVA. ? ?Hiatal hernia with gastroesophageal reflux disease and esophagitis ?-Worsening ?-Now having symptoms with cough foods and water. ?-Has Rx for Protonix however unclear if patient is currently taking 2/2 often forgetting to take the pill pack. ?-Referral for GI was previously placed on 06/17/2021.  Patient given office number and encouraged to contact in regards to scheduling an appointment. ? ?Essential hypertension  ?-Uncontrolled ?-Likely 2/2 pt forgetting to take medications as previously controlled on current regimen. ?-Recheck remains elevated. ?-Hesitant to adjust medications  2/2 current confusion/compliance issues with meds.  Will d/c chlorthalidone 25 mg daily. ?-Start Lasix 20 mg twice daily with Kdur 20 mEq twice daily. ?-continue hydralazine 25 mg 3 times daily. Will plan to d/c this med at next OFV to a daily or BID alternative to help with compliance. ?-Previously on lisinopril, but d/c'd during hospitalization September 2021 for AKI. ?-We will obtain labs. ?-Further adjustments based on labs. ?- Plan: CMP, TSH, T4, Free, chlorthalidone (HYGROTON) 25 MG tablet ? ?Bilateral leg edema ?-Likely multifactorial including dependent edema from increased sitting, increased sodium intake. ?-Echo from 12/26/2020 with a EF of 60-65%, mild LVH with grade 2 diastolic dysfunction ?-Concern for worsening CHF given murmur. ?-We will discontinue chlorthalidone 25 mg daily. ?-Start Lasix 20 mg twice daily and K. Dur 20 mEq twice daily. ?- Plan: CBC with Differential/Platelet, Brain Natriuretic Peptide, CMP, Protime-INR, ECHOCARDIOGRAM COMPLETE, TSH, T4, Free, D-dimer, Quantitative ? ?Heart murmur ?-Echo 12/26/2020 with  EF 60-65%, mild LVH with grade 2 diastolic dysfunction. ?-Consider starting Entresto. ?- Plan: CBC with Differential/Platelet, ECHOCARDIOGRAM COMPLETE ? ?Effusion of right elbow  ?- Plan: Protime-INR, Ambulatory referral to Orthopedic Surgery ? ?History of CVA (cerebrovascular accident) ?-Continue statin ?-Discussed the importance of BP control. ? ?Hemiplegia and hemiparesis following nontraumatic intracerebral hemorrhage affecting right dominant side ([place]) ?-Residual status post CVA ?-Patient encouraged to continue exercises taught in PT ? ?At risk for polypharmacy ?-Despite pillpack use pt still having compliance issues with medications. ?-Med reconciliation attempted.  Several medications listed have not been refilled in several months but patient states he is taking. ?-Will contact pharmacy to review meds ? ?F/u in 1 month ? ?[name], MD ?

## 2021-09-28 NOTE — Patient Instructions (Addendum)
A referral was placed for you to see the Gastroenterologist (stomach doctors) on 06/17/21.  It seems they left a VM as recently as 09/16/21 for you to call to set up an appt. ? ?[facility] ?Address: [address], [place], [place] [address] ?Phone: [phone]  ? ?A referral was placed for you to see the orthopedic doctors regarding your elbow. ? ?Referral was also placed for an ultrasound of your heart.  You should expect a phone call about scheduling this appointment. ? ?It looks like you may have been out of Seroquel for several months.  We will contact pharmacy in regards to checking on this. ?

## 2021-09-29 ENCOUNTER — Telehealth: Payer: Self-pay | Admitting: Family Medicine

## 2021-09-29 LAB — D-DIMER, QUANTITATIVE: D-Dimer, Quant: 0.28 mcg/mL FEU (ref <0.50)

## 2021-09-29 NOTE — Telephone Encounter (Signed)
Patient brother Olga Millers called in requesting to speak with someone regarding being updated on brother's health after stroke. ? ?Olga Millers could be contacted at [phone]. ? ?Please advise. ?

## 2021-09-30 ENCOUNTER — Telehealth: Payer: Self-pay | Admitting: Pharmacist
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
## 2021-10-01 ENCOUNTER — Other Ambulatory Visit: Payer: Medicare HMO

## 2021-10-01 NOTE — Telephone Encounter (Signed)
Spoke with [facility] about recent lab results.  ?

## 2021-10-06 ENCOUNTER — Ambulatory Visit: Payer: Medicare HMO | Admitting: Orthopedic Surgery

## 2021-10-06 DIAGNOSIS — R430 Anosmia: Secondary | ICD-10-CM | POA: Diagnosis not present

## 2021-10-06 DIAGNOSIS — R438 Other disturbances of smell and taste: Secondary | ICD-10-CM | POA: Diagnosis not present

## 2021-10-07 ENCOUNTER — Ambulatory Visit: Payer: Medicare HMO
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
## 2021-10-08 ENCOUNTER — Telehealth: Payer: Self-pay | Admitting: Family Medicine

## 2021-10-08 ENCOUNTER — Ambulatory Visit: Payer: Medicare HMO | Admitting: Orthopedic Surgery

## 2021-10-08 ENCOUNTER — Telehealth: Payer: Self-pay | Admitting: Pharmacist

## 2021-10-08 NOTE — Chronic Care Management (AMB) (Signed)
? ? ?  Chronic Care Management ?Pharmacy Assistant  ? ?Name: [name]  MRN: [id number] DOB: [birth year] ? ?Per Jeni Salles call to patient to offer to reschedule missed appointment on 10/07/21. Spoke to brother whom advised I should reach out to patient to reschedule via phone not in office. Call to patient did not reach left voicemail with my contact information for return call back. ? ? ?Medications: ?Outpatient Encounter Medications as of 10/08/2021  ?Medication Sig Note  ? acetaminophen (TYLENOL) 325 MG tablet Take 2 tablets (650 mg total) by mouth every 6 (six) hours as needed for moderate pain or headache.   ? Blood Pressure Monitoring (BLOOD PRESSURE MONITOR/L CUFF) MISC Use as directed daily.   ? buPROPion (WELLBUTRIN SR) 200 MG 12 hr tablet TAKE 1 TABLET BY MOUTH DAILY   ? cyclobenzaprine (FLEXERIL) 5 MG tablet TAKE 1 TABLET BY MOUTH TWICE A DAY AS NEEDED FOR MUSCLE SPASMS   ? FEROSUL 325 (65 Fe) MG tablet 1TID WITH MEALS   ? furosemide (LASIX) 20 MG tablet Take 1 tablet (20 mg total) by mouth 2 (two) times daily.   ? gabapentin (NEURONTIN) 300 MG capsule Take by mouth.   ? hydrALAZINE (APRESOLINE) 25 MG tablet TAKE 1 TABLET BY MOUTH THREE TIMES A DAY   ? HYDROcodone-acetaminophen (NORCO/VICODIN) 5-325 MG tablet Take 2 tablets by mouth every 6 (six) hours as needed. 07/15/2021: Last taken on yesterday. Not here for count.   ? ibuprofen (ADVIL) 600 MG tablet Take by mouth.   ? magnesium oxide (MAG-OX) 400 MG tablet Take 400 mg by mouth daily.   ? Multiple Vitamin (MULTIVITAMINS PO) Take 1 tablet by mouth daily.   ? mupirocin ointment (BACTROBAN) 2 % APPLY THIN LAYER TO AFFECTED AREA AS DIRECTED BY YOUR MEDICAL PROVIDER APPLY TO CLEAN DRY SKIN   ? pantoprazole (PROTONIX) 40 MG tablet TAKE 1 TABLET BY MOUTH TWICE A DAY BEFORE MEALS   ? potassium chloride SA (KLOR-CON M) 20 MEQ tablet Take 1 tablet (20 mEq total) by mouth 2 (two) times daily.   ? predniSONE (DELTASONE) 10 MG tablet Take 5 tabs on day 1, 4  tabs on day 2, 3 tabs on day 3, 2 tabs on day 4, 1 tab on day 5.   ? QUEtiapine (SEROQUEL) 200 MG tablet Take 1 tablet (200 mg total) by mouth at bedtime.   ? senna-docusate (SENOKOT-S) 8.6-50 MG tablet Take 1 tablet by mouth 2 (two) times daily.   ? sertraline (ZOLOFT) 100 MG tablet Take 1 tablet (100 mg total) by mouth daily.   ? silver sulfADIAZINE (SILVADENE) 1 % cream Apply 1 application topically daily.   ? sodium chloride (OCEAN) 0.65 % SOLN nasal spray Place 1 spray into both nostrils as needed for congestion.   ? thiamine 100 MG tablet Take by mouth.   ? ?No facility-administered encounter medications on file as of 10/08/2021.  ? ?Care Gaps: ?AWV - message sent to Ramond Craver on 09/30/21 ?Last BP - 185/85 on 09/28/2021 ?Covid vaccine - never done ?Shingrix - never done ?Pneumonia vaccine - overdue ?Flu vaccine - overdue ?  ?Star Rating Drugs: ?None ? ? ? ? ?Ned Clines CMA ?Clinical Pharmacist Assistant ?[phone] ? ?

## 2021-10-08 NOTE — Telephone Encounter (Signed)
Left message for patient to call back and schedule Medicare Annual Wellness Visit (AWV) either virtually or in office. Left  my Herbie Drape number [phone] ? ? ?Last AWV 10/01/20 ? please schedule at anytime with [facility] Nurse Health Advisor 1 or 2 ? ? ?This should be a 45 minute visit.  ?

## 2021-10-09 DIAGNOSIS — J329 Chronic sinusitis, unspecified: Secondary | ICD-10-CM | POA: Diagnosis not present

## 2021-10-09 DIAGNOSIS — R430 Anosmia: Secondary | ICD-10-CM | POA: Diagnosis not present

## 2021-10-13 ENCOUNTER — Encounter: Payer: Self-pay | Admitting: Physician Assistant

## 2021-10-15 ENCOUNTER — Telehealth: Payer: Self-pay | Admitting: Pharmacist

## 2021-10-15 NOTE — Chronic Care Management (AMB) (Signed)
? ? ?  Chronic Care Management ?Pharmacy Assistant  ? ?Name: Branton Einstein  MRN: [id number] DOB: 09-27-1950 ? ?Reason for Encounter: Reschedule initial appointment.  ?Unable to reach patient after several attempts.  ?  ?Gennie Alma CMA  ?Clinical Pharmacist Assistant ?[phone] ? ?

## 2021-10-22 ENCOUNTER — Ambulatory Visit (HOSPITAL_COMMUNITY): Payer: Medicare HMO | Attending: Cardiovascular Disease

## 2021-10-28 ENCOUNTER — Ambulatory Visit: Payer: Medicare HMO | Admitting: Physician Assistant

## 2021-10-28 NOTE — Progress Notes (Deleted)
? ? ? ?10/28/2021 ?[name] ?[id number] ?[birth year] ? ? ?ASSESSMENT AND PLAN:  ? ?There are no diagnoses linked to this encounter. ? ? ?Patient Care Team: ?Billie Ruddy, MD as PCP - General (Family Medicine) ?Viona Gilmore, [facility] as Pharmacist (Pharmacist) ? ?HISTORY OF PRESENT ILLNESS: ?71 y.o. male referred by Billie Ruddy, MD, with a past medical history of history of CVA with left-sided hemiparesis, prostate cancer, hepatitis C,  ischemic colitis, GERD with large hiatal hernia and Cameron ulcers 2019, history of gastrointestinal bleeding, history of iron deficiency anemia dating back to 2010 and others listed below presents for evaluation of GERD.  ?Patient known to Dr. Loletha Carrow, last seen in the office 02/2018, . ?12/26/2020 endoscopy hospitalization for anemia With Dr. Fuller Plan 10 cm hiatal hernia present 3 mm AVM without bleeding in duodenum status post APC, normal esophagus no Cameron lesions seen.  At that point consider repeat colonoscopy plus or minus VCE as outpatient. ?01/11/2018 endoscopy with Dr. Carlean Purl [place] grade B esophagitis, 10 cm hiatal hernia, gastritis, nonbleeding erosive gastropathy ?02/14/2017 CT abdomen pelvis with contrast for lower abdominal pain and back pain.  Moderate hiatal hernia, severe coronary artery calcifications, normal liver, normal gallbladder, normal pancreas, normal spleen, no inflammatory changes, small bowel anastomosis to the right hemiabdomen without obstruction, mild sigmoid diverticulosis.  Post partial prostatectomy ?10/26/2015 colonoscopy with Dr. Loletha Carrow for [place] Internal hemorrhoids otherwise normal.  Recall: 5 years.(10/2020) ? ?Patient presents today for further evaluation of his reflux. ?I do not see where patient had evaluation with surgery for potential hernia repair. ?Patient's been followed with primary care for anemia approximately January 03, 2021, admits at that time patient was not taking Protonix as he should.  Hemoglobin 10.5 10.6 previously  closer to 8.  06/17/2021 hemoglobin 9.3, repeat 09/28/2021 hemoglobin 8.1.  Does have microcytosis MCV 65.1.  Platelets elevated at 501.  White blood cell count 5.3. ?Patient is also been complaining of bilateral leg edema, has echocardiogram pending. ? ?CBC  09/28/2021  ?HGB 8.1 Repeated and verified X2. MCV 65.1 Repeated and verified X2. with microcytic anemia ?WBC 5.3 Platelets 501.0 ?Anemia panel 12/24/2020  ?Iron 13 Ferritin 3  ?12/24/2020  B12 534 ?Kidney function 09/28/2021  ?BUN 10 Cr 0.92  ?GFR >60  ?Potassium 4.1   ?LFTs 09/28/2021  ?AST 10 ALT 7 ?Alkphos 112 TBili 0.3 ?02/12/2020 LIPASE 23 ?09/28/2021 TSH 1.32 ?09/28/2021 INR 1.4 ? ?Current Medications:  ? ?Current Outpatient Medications (Endocrine & Metabolic):  ?  predniSONE (DELTASONE) 10 MG tablet, Take 5 tabs on day 1, 4 tabs on day 2, 3 tabs on day 3, 2 tabs on day 4, 1 tab on day 5. ? ?Current Outpatient Medications (Cardiovascular):  ?  furosemide (LASIX) 20 MG tablet, Take 1 tablet (20 mg total) by mouth 2 (two) times daily. ?  hydrALAZINE (APRESOLINE) 25 MG tablet, TAKE 1 TABLET BY MOUTH THREE TIMES A DAY ? ?Current Outpatient Medications (Respiratory):  ?  sodium chloride (OCEAN) 0.65 % SOLN nasal spray, Place 1 spray into both nostrils as needed for congestion. ? ?Current Outpatient Medications (Analgesics):  ?  acetaminophen (TYLENOL) 325 MG tablet, Take 2 tablets (650 mg total) by mouth every 6 (six) hours as needed for moderate pain or headache. ?  HYDROcodone-acetaminophen (NORCO/VICODIN) 5-325 MG tablet, Take 2 tablets by mouth every 6 (six) hours as needed. ?  ibuprofen (ADVIL) 600 MG tablet, Take by mouth. ? ?Current Outpatient Medications (Hematological):  ?  FEROSUL 325 (65 Fe) MG tablet, 1TID WITH  MEALS ? ?Current Outpatient Medications (Other):  ?  Blood Pressure Monitoring (BLOOD PRESSURE MONITOR/L CUFF) MISC, Use as directed daily. ?  buPROPion (WELLBUTRIN SR) 200 MG 12 hr tablet, TAKE 1 TABLET BY MOUTH DAILY ?  cyclobenzaprine (FLEXERIL)  5 MG tablet, TAKE 1 TABLET BY MOUTH TWICE A DAY AS NEEDED FOR MUSCLE SPASMS ?  gabapentin (NEURONTIN) 300 MG capsule, Take by mouth. ?  magnesium oxide (MAG-OX) 400 MG tablet, Take 400 mg by mouth daily. ?  Multiple Vitamin (MULTIVITAMINS PO), Take 1 tablet by mouth daily. ?  mupirocin ointment (BACTROBAN) 2 %, APPLY THIN LAYER TO AFFECTED AREA AS DIRECTED BY YOUR MEDICAL PROVIDER APPLY TO CLEAN DRY SKIN ?  pantoprazole (PROTONIX) 40 MG tablet, TAKE 1 TABLET BY MOUTH TWICE A DAY BEFORE MEALS ?  potassium chloride SA (KLOR-CON M) 20 MEQ tablet, Take 1 tablet (20 mEq total) by mouth 2 (two) times daily. ?  QUEtiapine (SEROQUEL) 200 MG tablet, Take 1 tablet (200 mg total) by mouth at bedtime. ?  senna-docusate (SENOKOT-S) 8.6-50 MG tablet, Take 1 tablet by mouth 2 (two) times daily. ?  sertraline (ZOLOFT) 100 MG tablet, Take 1 tablet (100 mg total) by mouth daily. ?  silver sulfADIAZINE (SILVADENE) 1 % cream, Apply 1 application topically daily. ?  thiamine 100 MG tablet, Take by mouth. ? ?Medical History:  ?Past Medical History:  ?Diagnosis Date  ? Arthritis   ? Cameron ulcer, chronic 01/11/2018  ? Chronic back pain   ? GERD (gastroesophageal reflux disease)   ? uses Baking Soda  ? GIB (gastrointestinal bleeding) 2012  ? Glaucoma   ? right eye  ? Headache   ? occasionally  ? Hepatitis C   ? Hiatal hernia with gastroesophageal reflux disease and esophagitis 01/05/2018  ? History of blood transfusion   ? no abnormal reaction noted  ? History of gout   ? doesn't take any meds  ? Hyperlipidemia   ? not on any meds  ? Insomnia   ? takes Ambien nightly  ? Ischemic colitis ([place]) 2012  ? Joint pain   ? Nocturia   ? Numbness   ? both legs occasionally  ? PONV (postoperative nausea and vomiting)   ? Prostate cancer ([place])   ? Shortness of breath dyspnea   ? rarely but when notices he can be lying/sitting/exertion.Dr.Hochrein is aware per pt  ? Stroke [facility]) 2016   ? Urinary frequency   ? Urinary urgency   ? ?Allergies:  ?Allergies   ?Allergen Reactions  ? Penicillins Anaphylaxis  ?  Has patient had a PCN reaction causing immediate rash, facial/tongue/throat swelling, SOB or lightheadedness with hypotension: Yes ?Has patient had a PCN reaction causing severe rash involving mucus membranes or skin necrosis: No ?Has patient had a PCN reaction that required hospitalization: No ?Has patient had a PCN reaction occurring within the last 10 years: No ?If all of the above answers are "NO", then may proceed with Cephalosporin use..  ? Sudafed [Pseudoephedrine] Palpitations and Other (See Comments)  ?  Heart "races"  ?  ? ?Surgical History:  ?He  has a past surgical history that includes Appendectomy; Small intestine surgery; Multiple abdominal surgeries; Prostatectomy; Colonoscopy; Esophagogastroduodenoscopy; Inguinal hernia repair (Right, 11/04/2014); Mass excision (N/A, 11/04/2014); Esophagogastroduodenoscopy (N/A, 10/26/2015); Colonoscopy (N/A, 10/26/2015); Esophagogastroduodenoscopy (egd) with propofol (N/A, 01/11/2018); biopsy (01/11/2018); Esophagogastroduodenoscopy (egd) with propofol (N/A, 12/26/2020); and Hot hemostasis (N/A, 12/26/2020). ?Family History:  ?His family history includes Alzheimer's disease in his father; CAD in his brother; Cancer in  his mother; Heart disease (age of onset: 33) in his sister; Heart failure (age of onset: 41) in his brother. ?Social History:  ? reports that he has been smoking cigarettes. He has a 25.00 pack-year smoking history. He has never used smokeless tobacco. He reports current alcohol use. He reports that he does not currently use drugs after having used the following drugs: Marijuana. Frequency: 5.00 times per week. ? ?REVIEW OF SYSTEMS  : All other systems reviewed and negative except where noted in the History of Present Illness. ? ? ?PHYSICAL EXAM: ?There were no vitals taken for this visit. ?General:   Pleasant, well developed male in no acute distress ?Head:  Normocephalic and atraumatic. ?Eyes:  {sclerae:26738},conjunctive {conjuctiva:26739}  ?Heart:  {HEART EXAM HEM/ONC:21750} ?Pulm: Clear anteriorly; no wheezing ?Abdomen:   {BlankSingle:19197::"Distended","Ridged","Soft"}, {BlankSingle:19197::"Flat","Obese","

## 2021-11-03 ENCOUNTER — Telehealth: Payer: Self-pay | Admitting: Family Medicine

## 2021-11-03 NOTE — Telephone Encounter (Signed)
Left message for patient to call back and schedule Medicare Annual Wellness Visit (AWV) either virtually or in office. Left  my jabber number [phone]   Last AWV ;10/01/20  please schedule at anytime with [facility] Nurse Health Advisor 1 or 2   

## 2021-11-10 ENCOUNTER — Ambulatory Visit (HOSPITAL_COMMUNITY): Payer: Medicare HMO | Attending: Cardiology

## 2021-11-10 ENCOUNTER — Encounter (INDEPENDENT_AMBULATORY_CARE_PROVIDER_SITE_OTHER): Payer: Self-pay

## 2021-11-10 DIAGNOSIS — R011 Cardiac murmur, unspecified: Secondary | ICD-10-CM | POA: Diagnosis not present

## 2021-11-10 DIAGNOSIS — R600 Localized edema: Secondary | ICD-10-CM

## 2021-11-10 LAB — ECHOCARDIOGRAM COMPLETE
Area-P 1/2: 3.27 cm2
P 1/2 time: 381 msec
S' Lateral: 3.9 cm

## 2021-11-17 ENCOUNTER — Telehealth: Payer: Self-pay | Admitting: Family Medicine

## 2021-11-17 DIAGNOSIS — R481 Agnosia: Secondary | ICD-10-CM

## 2021-11-17 NOTE — Telephone Encounter (Signed)
Pt call and stated he want to go to a new ENT because [facility] is not doing anything the patient want a call back at [phone]. ?

## 2021-11-23 NOTE — Telephone Encounter (Addendum)
Upon review of chart, pt has seen ENT & CT came back normal; exercises given to help with smell & taste. Pt recently reached out to ENT stating he has not noticed improvement with recommended exercises. ENT placed a neurology referral. ? ?Referral for second opinion placed per pt request. ?

## 2021-11-27 ENCOUNTER — Telehealth: Payer: Self-pay | Admitting: Family Medicine

## 2021-11-27 NOTE — Telephone Encounter (Signed)
Pt is calling and would like to know if he can increase QUEtiapine (SEROQUEL) 200 MG tablet . Pt feels like he need to have mg increase he has been o 200 mg for so long ?

## 2021-11-30 ENCOUNTER — Ambulatory Visit (INDEPENDENT_AMBULATORY_CARE_PROVIDER_SITE_OTHER): Payer: Medicare HMO | Admitting: Family Medicine

## 2021-11-30 VITALS — BP 136/72 | HR 70 | Temp 98.9°F

## 2021-11-30 DIAGNOSIS — R600 Localized edema: Secondary | ICD-10-CM | POA: Diagnosis not present

## 2021-11-30 DIAGNOSIS — G4700 Insomnia, unspecified: Secondary | ICD-10-CM | POA: Diagnosis not present

## 2021-11-30 DIAGNOSIS — H9190 Unspecified hearing loss, unspecified ear: Secondary | ICD-10-CM | POA: Diagnosis not present

## 2021-11-30 DIAGNOSIS — R481 Agnosia: Secondary | ICD-10-CM | POA: Diagnosis not present

## 2021-11-30 DIAGNOSIS — I5032 Chronic diastolic (congestive) heart failure: Secondary | ICD-10-CM | POA: Diagnosis not present

## 2021-11-30 NOTE — Progress Notes (Signed)
Subjective:    Patient ID: [name], male    DOB: 17-Mar-1951, 71 y.o.   MRN: [id number]  Chief Complaint  Patient presents with   Foot Swelling    Feet and leg swelling.    HPI Patient was seen today for f/u.  Seen by ENT, but still having difficulty with taste and smell.  CT head was negative.  Pt was supposed to get info regarding smell retraining, but states it was not included in the envelope he was given.  Pt not eating much as it is "frustrating not to be able to taste or smell [the] food".  Inquires about new ENT referral.  Pt states he cannot sleep.  Previously on Trazadone 200 mg, but it stopped working.  Per chart review appears pt has been out of Trazodone for over 1 yr.  Pt with LE edema.  Elevates his legs when sitting and some times when in bed.  Had ECHO, HFpEF noted.   Pt states he needs hearing aids, new glasses, and dentures.  Followed by the [facility].  Past Medical History:  Diagnosis Date   Arthritis    [name] ulcer, chronic 01/11/2018   Chronic back pain    GERD (gastroesophageal reflux disease)    uses Baking Soda   GIB (gastrointestinal bleeding) 2012   Glaucoma    right eye   Headache    occasionally   Hepatitis C    Hiatal hernia with gastroesophageal reflux disease and esophagitis 01/05/2018   History of blood transfusion    no abnormal reaction noted   History of gout    doesn't take any meds   Hyperlipidemia    not on any meds   Insomnia    takes Ambien nightly   Ischemic colitis ([place]) 2012   Joint pain    Nocturia    Numbness    both legs occasionally   PONV (postoperative nausea and vomiting)    Prostate cancer (HCC)    Shortness of breath dyspnea    rarely but when notices he can be lying/sitting/exertion.Dr.Hochrein is aware per pt   Stroke [facility]) 2016    Urinary frequency    Urinary urgency     Allergies  Allergen Reactions   Penicillins Anaphylaxis    Has patient had a PCN reaction causing immediate rash, facial/tongue/throat  swelling, SOB or lightheadedness with hypotension: Yes Has patient had a PCN reaction causing severe rash involving mucus membranes or skin necrosis: No Has patient had a PCN reaction that required hospitalization: No Has patient had a PCN reaction occurring within the last 10 years: No If all of the above answers are "NO", then may proceed with Cephalosporin use.[name] [Pseudoephedrine] Palpitations and Other (See Comments)    Heart "races"    ROS General: Denies fever, chills, night sweats, changes in weight, changes in appetite  HEENT: Denies headaches, ear pain, changes in vision, rhinorrhea, sore throat + loss of taste, loss of smell, decreased hearing CV: Denies CP, palpitations, SOB, orthopnea  +LE edema Pulm: Denies SOB, cough, wheezing GI: Denies abdominal pain, nausea, vomiting, diarrhea, constipation GU: Denies dysuria, hematuria, frequency Msk: Denies muscle cramps, joint pains Neuro: Denies weakness, numbness, tingling Skin: Denies rashes, bruising Psych: Denies depression, anxiety, hallucinations + insomnia     Objective:    Blood pressure 136/72, pulse 70, temperature 98.9 F (37.2 C), temperature source Oral, SpO2 99 %.  Gen. Pleasant, well-nourished, in no distress, normal affect   HEENT: [place]/AT, face symmetric, conjunctiva clear,  no scleral icterus, PERRLA, EOMI, nares patent without drainage Lungs: no accessory muscle use, CTAB, no wheezes or rales Cardiovascular: RRR, no m/r/g, 1+ pitting edema in bilateral feet, trace edema at ankles.   Abdomen: BS present, soft, NT/ND Musculoskeletal: No deformities, no cyanosis or clubbing, normal tone Neuro:  A&Ox3, CN II-XII intact, gait not assessed as sitting in motorized scooter Skin:  Warm, no lesions/ rash   Wt Readings from Last 3 Encounters:  03/17/21 169 lb (76.7 kg)  12/25/20 173 lb 1 oz (78.5 kg)  07/30/20 173 lb (78.5 kg)    Lab Results  Component Value Date   WBC 5.3 09/28/2021   HGB 8.1 Repeated  and verified X2. (L) 09/28/2021   HCT 28.0 Repeated and verified X2. (L) 09/28/2021   PLT 501.0 (H) 09/28/2021   GLUCOSE 91 09/28/2021   CHOL 108 06/17/2021   TRIG 104.0 06/17/2021   HDL 39.80 06/17/2021   LDLCALC 47 06/17/2021   ALT 7 09/28/2021   AST 10 09/28/2021   NA 141 09/28/2021   K 4.1 09/28/2021   CL 105 09/28/2021   CREATININE 0.92 09/28/2021   BUN 10 09/28/2021   CO2 27 09/28/2021   TSH 1.32 09/28/2021   INR 1.4 (H) 09/28/2021   HGBA1C 4.7 (L) 07/22/2018    On day of service, 42 minutes spent  face-to-face with pt, reviewing the chart, counseling and/or coordinating care for plan and treatment..    Assessment/Plan:  Agnosia for taste -Reviewed notes from ENT.  Patient advised on neurology referral placed by ENT as work-up was negative for causes of loss of taste and smell. -Patient encouraged to contact ENT office regarding smell retraining information as patient states he never received this in the mail. -Continue follow-up with ENT.  Encouraged to answer the phone regarding neurology referral.  Agnosia for smell -as above -pt to schedule f/u with Neurology and f/u with ENT prn  Bilateral lower extremity edema -Likely multifactorial. -Echo with HFpEF on 11/10/2021 with EF 60-65%, severe asymmetric LVH of the basal septal segment, LV diastolic parameters consistent with grade 1 diastolic dysfunction (impaired relaxation).  Aortic valve not well visualized.  Aortic valve regurg mild to moderate.  No AS present. -Discussed moving around more, elevating LEs, compression socks or TED hose, and reducing sodium intake -Continue current medications including Lasix -Decrease protein intake also likely contributing.  - Plan: Ambulatory referral to Cardiology  Insomnia, unspecified type -Discussed sleep hygiene -Previously on trazodone 200 mg without improvement in symptoms -Discussed follow-up with psychiatry.  Patient to schedule appointment.  Chronic heart failure  with preserved ejection fraction (HCC)  -Discussed supportive care including elevating LEs when sitting, reducing sodium intake, TED hose or compression socks -Continue Lasix 20 mg - Plan: Ambulatory referral to Cardiology  Decreased hearing, unspecified laterality  - Plan: Ambulatory referral to Audiology  F/u in the next few months as needed  [name], MD

## 2021-12-02 ENCOUNTER — Ambulatory Visit: Payer: Medicare HMO | Admitting: Internal Medicine

## 2021-12-02 ENCOUNTER — Telehealth: Payer: Self-pay | Admitting: Family Medicine

## 2021-12-02 NOTE — Progress Notes (Deleted)
Cardiology Office Note:    Date:  12/02/2021   ID:  [name], DOB 1950-11-30, MRN [id number]  PCP:  Billie Ruddy, MD   [facility] Providers Cardiologist:  None { Click to update primary MD,subspecialty MD or APP then REFRESH:1}    Referring MD: Billie Ruddy, MD   No chief complaint on file. ***  History of Present Illness:    [name] is a 71 y.o. male with a hx of hep C, prostate cancer,stroke, referral for c/f HfpEF   BNP pending. Prior have been normal Normal TSH   Cardiology Studies: SPECT for surgical pre-op risk eval:  09/25/2014: lexiscan: low risk TTE 2016- Normal EF, no significant valve dx Follow up echo normal EF, E/e' < 14. Severe asymmetric  basal-septal hypertrophy, very low obstructive gradient ( 7 mmHg)  Past Medical History:  Diagnosis Date   Arthritis    [name] ulcer, chronic 01/11/2018   Chronic back pain    GERD (gastroesophageal reflux disease)    uses Baking Soda   GIB (gastrointestinal bleeding) 2012   Glaucoma    right eye   Headache    occasionally   Hepatitis C    Hiatal hernia with gastroesophageal reflux disease and esophagitis 01/05/2018   History of blood transfusion    no abnormal reaction noted   History of gout    doesn't take any meds   Hyperlipidemia    not on any meds   Insomnia    takes Ambien nightly   Ischemic colitis ([place]) 2012   Joint pain    Nocturia    Numbness    both legs occasionally   PONV (postoperative nausea and vomiting)    Prostate cancer (HCC)    Shortness of breath dyspnea    rarely but when notices he can be lying/sitting/exertion.Dr.Hochrein is aware per pt   Stroke [facility]) 2016    Urinary frequency    Urinary urgency     Past Surgical History:  Procedure Laterality Date   APPENDECTOMY     BIOPSY  01/11/2018   Procedure: BIOPSY;  Surgeon: Gatha Mayer, MD;  Location: [facility] ENDOSCOPY;  Service: Endoscopy;;   COLONOSCOPY     COLONOSCOPY N/A 10/26/2015   Procedure:  COLONOSCOPY;  Surgeon: Doran Stabler, MD;  Location: [place];  Service: Endoscopy;  Laterality: N/A;   ESOPHAGOGASTRODUODENOSCOPY     ESOPHAGOGASTRODUODENOSCOPY N/A 10/26/2015   Procedure: ESOPHAGOGASTRODUODENOSCOPY (EGD);  Surgeon: Doran Stabler, MD;  Location[facility] ENDOSCOPY;  Service: Endoscopy;  Laterality: N/A;   ESOPHAGOGASTRODUODENOSCOPY (EGD) WITH PROPOFOL N/A 01/11/2018   Procedure: ESOPHAGOGASTRODUODENOSCOPY (EGD) WITH PROPOFOL;  Surgeon: Gatha Mayer, MD;  [facility];  Service: Endoscopy;  Laterality: N/A;   ESOPHAGOGASTRODUODENOSCOPY (EGD) WITH PROPOFOL N/A 12/26/2020   Procedure: ESOPHAGOGASTRODUODENOSCOPY (EGD) WITH PROPOFOL;  Surgeon: Ladene Artist, MD;  Location: [facility] ENDOSCOPY;  Service: Endoscopy;  Laterality: N/A;   HOT HEMOSTASIS N/A 12/26/2020   Procedure: HOT HEMOSTASIS (ARGON PLASMA COAGULATION/BICAP);  Surgeon: Ladene Artist, MD;  Location: [facility] ENDOSCOPY;  Service: Endoscopy;  Laterality: N/A;   INGUINAL HERNIA REPAIR Right 11/04/2014   Procedure: RIGHT INGUINAL HERNIA REPAIR WITH MESH;  Surgeon: Jackolyn Confer, MD;  Location: [place];  Service: General;  Laterality: Right;   MASS EXCISION N/A 11/04/2014   Procedure: REMOVAL OF RIGHT GROIN SOFT TISSUE MASS;  Surgeon: Jackolyn Confer, MD;  Location: [place];  Service: General;  Laterality: N/A;   Multiple abdominal surgeries     total  of 13   PROSTATECTOMY     SMALL INTESTINE SURGERY      Current Medications: No outpatient medications have been marked as taking for the 12/02/21 encounter (Appointment) with Janina Mayo, MD.     Allergies:   Penicillins and Sudafed [pseudoephedrine]   Social History   Socioeconomic History   Marital status: Legally Separated    Spouse name: Not on file   Number of children: 2   Years of education: Not on file   Highest education level: Not on file  Occupational History   Occupation: retired  Tobacco Use   Smoking status: Every Day    Packs/day: 0.50     Years: 50.00    Pack years: 25.00    Types: Cigarettes   Smokeless tobacco: Never  Vaping Use   Vaping Use: Former  Substance and Sexual Activity   Alcohol use: Yes    Alcohol/week: 0.0 standard drinks    Comment: occasionally   Drug use: Not Currently    Frequency: 5.0 times per week    Types: Marijuana    Comment: "as often as I can get it"   Sexual activity: Yes  Other Topics Concern   Not on file  Social History Narrative   One living child .  One murdered.  Lives with girlfriend.     Social Determinants of Health   Financial Resource Strain: Not on file  Food Insecurity: Not on file  Transportation Needs: Not on file  Physical Activity: Not on file  Stress: Not on file  Social Connections: Not on file     Family History: The patient's ***family history includes Alzheimer's disease in his father; CAD in his brother; Cancer in his mother; Heart disease (age of onset: 59) in his sister; Heart failure (age of onset: 13) in his brother.  ROS:   Please see the history of present illness.    *** All other systems reviewed and are negative.  EKGs/Labs/Other Studies Reviewed:    The following studies were reviewed today: ***  EKG:  EKG is *** ordered today.  The ekg ordered today demonstrates ***  Recent Labs: 12/27/2020: Magnesium 1.9 06/17/2021: Pro B Natriuretic peptide (BNP) 34.0 09/28/2021: ALT 7; BUN 10; Creatinine, Ser 0.92; Hemoglobin 8.1 Repeated and verified X2.; Platelets 501.0; Potassium 4.1; Sodium 141; TSH 1.32  Recent Lipid Panel    Component Value Date/Time   CHOL 108 06/17/2021 1448   TRIG 104.0 06/17/2021 1448   HDL 39.80 06/17/2021 1448   CHOLHDL 3 06/17/2021 1448   VLDL 20.8 06/17/2021 1448   LDLCALC 47 06/17/2021 1448     Risk Assessment/Calculations:   {Does this patient have ATRIAL FIBRILLATION?:[id number]}       Physical Exam:    VS:  There were no vitals taken for this visit.    Wt Readings from Last 3 Encounters:  03/17/21 169  lb (76.7 kg)  12/25/20 173 lb 1 oz (78.5 kg)  07/30/20 173 lb (78.5 kg)     GEN: *** Well nourished, well developed in no acute distress HEENT: Normal NECK: No JVD; No carotid bruits LYMPHATICS: No lymphadenopathy CARDIAC: ***RRR, no murmurs, rubs, gallops RESPIRATORY:  Clear to auscultation without rales, wheezing or rhonchi  ABDOMEN: Soft, non-tender, non-distended MUSCULOSKELETAL:  No edema; No deformity  SKIN: Warm and dry NEUROLOGIC:  Alert and oriented x 3 PSYCHIATRIC:  Normal affect   ASSESSMENT:    No diagnosis found. PLAN:    In order of problems listed above:  ***      {  Are you ordering a CV Procedure (e.g. stress test, cath, DCCV, TEE, etc)?   Press F2        :[id number]}    Medication Adjustments/Labs and Tests Ordered: Current medicines are reviewed at length with the patient today.  Concerns regarding medicines are outlined above.  No orders of the defined types were placed in this encounter.  No orders of the defined types were placed in this encounter.   There are no Patient Instructions on file for this visit.   Signed, Janina Mayo, MD  12/02/2021 9:07 AM    [place]

## 2021-12-02 NOTE — Telephone Encounter (Signed)
Pt requesting smell and taste paperwork be resent to him. Says he didn't receive it at last appointment ?

## 2021-12-03 ENCOUNTER — Ambulatory Visit (INDEPENDENT_AMBULATORY_CARE_PROVIDER_SITE_OTHER): Payer: Medicare HMO | Admitting: Internal Medicine

## 2021-12-03 ENCOUNTER — Encounter: Payer: Self-pay | Admitting: Internal Medicine

## 2021-12-03 VITALS — BP 162/82 | HR 69 | Ht 69.5 in | Wt 187.4 lb

## 2021-12-03 DIAGNOSIS — R609 Edema, unspecified: Secondary | ICD-10-CM | POA: Diagnosis not present

## 2021-12-03 MED ORDER — SPIRONOLACTONE 25 MG PO TABS: 25.0000 mg | ORAL_TABLET | Freq: Every day | ORAL | 3 refills | Status: AC

## 2021-12-03 NOTE — Patient Instructions (Signed)
Medication Instructions:  START spironolactone 25 mg daily  *If you need a refill on your cardiac medications before your next appointment, please call your pharmacy*   Lab Work: Please return for labs in 2 weeks (BMET)  Our in office lab hours are Monday-Friday 8:00-4:00, closed for lunch 12:45-1:45 pm.  No appointment needed.  [facility] locations:   [place] ([place]) - [address] [place] [address] - [address] [place] - [address] ([facility])   Follow-Up: At [facility], you and your health needs are our priority.  As part of our continuing mission to provide you with exceptional heart care, we have created designated Provider Care Teams.  These Care Teams include your primary Cardiologist (physician) and Advanced Practice Providers (APPs -  Physician Assistants and Nurse Practitioners) who all work together to provide you with the care you need, when you need it.  We recommend signing up for the patient portal called "MyChart".  Sign up information is provided on this After Visit Summary.  MyChart is used to connect with patients for Virtual Visits (Telemedicine).  Patients are able to view lab/test results, encounter notes, upcoming appointments, etc.  Non-urgent messages can be sent to your provider as well.   To learn more about what you can do with MyChart, go to [email or web address].    Your next appointment:   6 month(s)  The format for your next appointment:   In Person  Provider:   Dr. Harl Bowie   Important Information About Sugar

## 2021-12-03 NOTE — Telephone Encounter (Signed)
Pt was advised at last visit to call [place], where he was seen.

## 2021-12-03 NOTE — Progress Notes (Signed)
Cardiology Office Note:    Date:  12/03/2021   ID:  [name], DOB [birth year], MRN [id number]  PCP:  Billie Ruddy, MD   [facility] Providers Cardiologist:  None     Referring MD: Billie Ruddy, MD   No chief complaint on file. LE edema  History of Present Illness:    [name] is a 71 y.o. male with a hx of GERD,stroke, hepatitis C,prostate cancer, CVAx2, referral for LE edema  He's had persistent leg and feet swelling over the last year. He's been in a wheel chair for 2 years. The swelling can go down with elevation but not always. He tried compression stockings, but this does not help. He takes lasix 20 mg BID. Normal renal function.  He doesn't sleep at all. No PND or orthopnea. He denies DOE.  He did not take his BP meds today. His blood pressure fluctuate and can be significantly elevated.  BNP is pending. Had a prior BNP that was normal 5 months ago.  Cardiology Studies: TTE: EF 60-65% Grade I DD, severe basal septal hypertrophy with no significant outflow obstruction, no SAM. Normal LA size, E/e' < 14  2016-lexiscan, low risk study TTE 05/31/2015: EF normal, no valve disease  Past Medical History:  Diagnosis Date   Arthritis    [name] ulcer, chronic 01/11/2018   Chronic back pain    GERD (gastroesophageal reflux disease)    uses Baking Soda   GIB (gastrointestinal bleeding) 2012   Glaucoma    right eye   Headache    occasionally   Hepatitis C    Hiatal hernia with gastroesophageal reflux disease and esophagitis 01/05/2018   History of blood transfusion    no abnormal reaction noted   History of gout    doesn't take any meds   Hyperlipidemia    not on any meds   Insomnia    takes Ambien nightly   Ischemic colitis ([place]) 2012   Joint pain    Nocturia    Numbness    both legs occasionally   PONV (postoperative nausea and vomiting)    Prostate cancer (HCC)    Shortness of breath dyspnea    rarely but when notices he can be  lying/sitting/exertion.Dr.Hochrein is aware per pt   Stroke [facility]) 2016    Urinary frequency    Urinary urgency     Past Surgical History:  Procedure Laterality Date   APPENDECTOMY     BIOPSY  01/11/2018   Procedure: BIOPSY;  Surgeon: Gatha Mayer, MD;  [facility];  Service: Endoscopy;;   COLONOSCOPY     COLONOSCOPY N/A 10/26/2015   Procedure: COLONOSCOPY;  Surgeon: Doran Stabler, MD;  Location: [place];  Service: Endoscopy;  Laterality: N/A;   ESOPHAGOGASTRODUODENOSCOPY     ESOPHAGOGASTRODUODENOSCOPY N/A 10/26/2015   Procedure: ESOPHAGOGASTRODUODENOSCOPY (EGD);  Surgeon: Doran Stabler, MD;  Location[facility] ENDOSCOPY;  Service: Endoscopy;  Laterality: N/A;   ESOPHAGOGASTRODUODENOSCOPY (EGD) WITH PROPOFOL N/A 01/11/2018   Procedure: ESOPHAGOGASTRODUODENOSCOPY (EGD) WITH PROPOFOL;  Surgeon: Gatha Mayer, MD;  [facility]OS[facility];  Service: Endoscopy;  Laterality: N/A;   ESOPHAGOGASTRODUODENOSCOPY (EGD) WITH PROPOFOL N/A 12/26/2020   Procedure: ESOPHAGOGASTRODUODENOSCOPY (EGD) WITH PROPOFOL;  Surgeon: Ladene Artist, MD;  Location: [facility] ENDOSCOPY;  Service: Endoscopy;  Laterality: N/A;   HOT HEMOSTASIS N/A 12/26/2020   Procedure: HOT HEMOSTASIS (ARGON PLASMA COAGULATION/BICAP);  Surgeon: Ladene Artist, MD;  Location: [facility] ENDOSCOPY;  Service: Endoscopy;  Laterality: N/A;  INGUINAL HERNIA REPAIR Right 11/04/2014   Procedure: RIGHT INGUINAL HERNIA REPAIR WITH MESH;  Surgeon: Jackolyn Confer, MD;  Location: [place];  Service: General;  Laterality: Right;   MASS EXCISION N/A 11/04/2014   Procedure: REMOVAL OF RIGHT GROIN SOFT TISSUE MASS;  Surgeon: Jackolyn Confer, MD;  Location: [place];  Service: General;  Laterality: N/A;   Multiple abdominal surgeries     total of 13   PROSTATECTOMY     SMALL INTESTINE SURGERY      Current Medications: Current Meds  Medication Sig   acetaminophen (TYLENOL) 325 MG tablet Take 2 tablets (650 mg total) by mouth every 6 (six) hours as  needed for moderate pain or headache.   buPROPion (WELLBUTRIN SR) 200 MG 12 hr tablet TAKE 1 TABLET BY MOUTH DAILY   cyclobenzaprine (FLEXERIL) 5 MG tablet TAKE 1 TABLET BY MOUTH TWICE A DAY AS NEEDED FOR MUSCLE SPASMS   FEROSUL 325 (65 Fe) MG tablet 1TID WITH MEALS   furosemide (LASIX) 20 MG tablet Take 1 tablet (20 mg total) by mouth 2 (two) times daily.   gabapentin (NEURONTIN) 300 MG capsule Take by mouth.   hydrALAZINE (APRESOLINE) 25 MG tablet TAKE 1 TABLET BY MOUTH THREE TIMES A DAY   HYDROcodone-acetaminophen (NORCO/VICODIN) 5-325 MG tablet Take 2 tablets by mouth every 6 (six) hours as needed.   ibuprofen (ADVIL) 600 MG tablet Take by mouth.   lisinopril (ZESTRIL) 40 MG tablet Take 1 tablet by mouth daily.   magnesium oxide (MAG-OX) 400 MG tablet Take 400 mg by mouth daily.   Multiple Vitamin (MULTIVITAMINS PO) Take 1 tablet by mouth daily.   mupirocin ointment (BACTROBAN) 2 % APPLY THIN LAYER TO AFFECTED AREA AS DIRECTED BY YOUR MEDICAL PROVIDER APPLY TO CLEAN DRY SKIN   pantoprazole (PROTONIX) 40 MG tablet TAKE 1 TABLET BY MOUTH TWICE A DAY BEFORE MEALS   potassium chloride SA (KLOR-CON M) 20 MEQ tablet Take 1 tablet (20 mEq total) by mouth 2 (two) times daily.   predniSONE (DELTASONE) 10 MG tablet Take 5 tabs on day 1, 4 tabs on day 2, 3 tabs on day 3, 2 tabs on day 4, 1 tab on day 5.   QUEtiapine (SEROQUEL) 200 MG tablet Take 1 tablet (200 mg total) by mouth at bedtime.   senna-docusate (SENOKOT-S) 8.6-50 MG tablet Take 1 tablet by mouth 2 (two) times daily.   sertraline (ZOLOFT) 100 MG tablet Take 1 tablet (100 mg total) by mouth daily.   silver sulfADIAZINE (SILVADENE) 1 % cream Apply 1 application topically daily.   sodium chloride (OCEAN) 0.65 % SOLN nasal spray Place 1 spray into both nostrils as needed for congestion.   spironolactone (ALDACTONE) 25 MG tablet Take 1 tablet (25 mg total) by mouth daily.   thiamine 100 MG tablet Take by mouth.     Allergies:   Penicillins  and Sudafed [pseudoephedrine]   Social History   Socioeconomic History   Marital status: Legally Separated    Spouse name: Not on file   Number of children: 2   Years of education: Not on file   Highest education level: Not on file  Occupational History   Occupation: retired  Tobacco Use   Smoking status: Every Day    Packs/day: 0.50    Years: 50.00    Pack years: 25.00    Types: Cigarettes   Smokeless tobacco: Never  Vaping Use   Vaping Use: Former  Substance and Sexual Activity   Alcohol use: Yes  Alcohol/week: 0.0 standard drinks    Comment: occasionally   Drug use: Not Currently    Frequency: 5.0 times per week    Types: Marijuana    Comment: "as often as I can get it"   Sexual activity: Yes  Other Topics Concern   Not on file  Social History Narrative   One living child .  One murdered.  Lives with girlfriend.     Social Determinants of Health   Financial Resource Strain: Not on file  Food Insecurity: Not on file  Transportation Needs: Not on file  Physical Activity: Not on file  Stress: Not on file  Social Connections: Not on file     Family History: The patient's family history includes Alzheimer's disease in his father; CAD in his brother; Cancer in his mother; Heart disease (age of onset: 61) in his sister; Heart failure (age of onset: 51) in his brother.  ROS:   Please see the history of present illness.     All other systems reviewed and are negative.  EKGs/Labs/Other Studies Reviewed:    The following studies were reviewed today:   EKG:  EKG is  ordered today.  The ekg ordered today demonstrates   12/03/2021-NSR, LVH  Recent Labs: 12/27/2020: Magnesium 1.9 06/17/2021: Pro B Natriuretic peptide (BNP) 34.0 09/28/2021: ALT 7; BUN 10; Creatinine, Ser 0.92; Hemoglobin 8.1 Repeated and verified X2.; Platelets 501.0; Potassium 4.1; Sodium 141; TSH 1.32   Recent Lipid Panel    Component Value Date/Time   CHOL 108 06/17/2021 1448   TRIG 104.0  06/17/2021 1448   HDL 39.80 06/17/2021 1448   CHOLHDL 3 06/17/2021 1448   VLDL 20.8 06/17/2021 1448   LDLCALC 47 06/17/2021 1448     Risk Assessment/Calculations:           Physical Exam:    VS:    Vitals:   12/03/21 1457  BP: (!) 162/82  Pulse: 69  SpO2: 98%     Wt Readings from Last 3 Encounters:  12/03/21 187 lb 6.4 oz (85 kg)  03/17/21 169 lb (76.7 kg)  12/25/20 173 lb 1 oz (78.5 kg)     GEN: Sitting in a wheelchair. Well nourished, well developed in no acute distress HEENT: Normal NECK: No JVD; No carotid bruits LYMPHATICS: No lymphadenopathy CARDIAC: RRR, no murmurs, rubs, gallops RESPIRATORY:  Clear to auscultation without rales, wheezing or rhonchi  ABDOMEN: Soft, non-tender, non-distended MUSCULOSKELETAL:  No edema; No deformity  SKIN: Warm and dry NEUROLOGIC:  Alert and oriented x 3 PSYCHIATRIC:  Normal affect   ASSESSMENT:    HTN heart disease: He has hypertensive heart disease and hx of CVA. BP is challenging to manage. He has no signs of acute decompensated CHF today. Leg swelling may be dependant edema. Recommended to continue compression stockings. Will start spironolactone as a diuretic and add additional BP lowering support.  Can uptitrate this regimen for Bp goal < 130/90 mmhg.  PLAN:    In order of problems listed above:  BMET 2 weeks Start spironolactone 25 mg daily Follow up in 6 months         Medication Adjustments/Labs and Tests Ordered: Current medicines are reviewed at length with the patient today.  Concerns regarding medicines are outlined above.  Orders Placed This Encounter  Procedures   Basic metabolic panel   EKG [id number]   Meds ordered this encounter  Medications   spironolactone (ALDACTONE) 25 MG tablet    Sig: Take 1 tablet (25 mg total) by  mouth daily.    Dispense:  90 tablet    Refill:  3    Patient Instructions  Medication Instructions:  START spironolactone 25 mg daily  *If you need a refill on your  cardiac medications before your next appointment, please call your pharmacy*   Lab Work: Please return for labs in 2 weeks (BMET)  Our in office lab hours are Monday-Friday 8:00-4:00, closed for lunch 12:45-1:45 pm.  No appointment needed.  [facility] locations:   [place] ([place]) - [address] [place] [address] - [address] [place] - [address] ([facility])   Follow-Up: At [facility], you and your health needs are our priority.  As part of our continuing mission to provide you with exceptional heart care, we have created designated Provider Care Teams.  These Care Teams include your primary Cardiologist (physician) and Advanced Practice Providers (APPs -  Physician Assistants and Nurse Practitioners) who all work together to provide you with the care you need, when you need it.  We recommend signing up for the patient portal called "MyChart".  Sign up information is provided on this After Visit Summary.  MyChart is used to connect with patients for Virtual Visits (Telemedicine).  Patients are able to view lab/test results, encounter notes, upcoming appointments, etc.  Non-urgent messages can be sent to your provider as well.   To learn more about what you can do with MyChart, go to [email or web address].    Your next appointment:   6 month(s)  The format for your next appointment:   In Person  Provider:   Dr. Harl Bowie   Important Information About Sugar         Signed, Janina Mayo, MD  12/03/2021 3:34 PM    [place]

## 2021-12-04 ENCOUNTER — Other Ambulatory Visit: Payer: Self-pay | Admitting: Family Medicine

## 2021-12-04 DIAGNOSIS — F32A Depression, unspecified: Secondary | ICD-10-CM

## 2021-12-04 DIAGNOSIS — I10 Essential (primary) hypertension: Secondary | ICD-10-CM

## 2021-12-08 ENCOUNTER — Telehealth: Payer: Self-pay | Admitting: Internal Medicine

## 2021-12-08 ENCOUNTER — Other Ambulatory Visit: Payer: Self-pay | Admitting: Family Medicine

## 2021-12-08 DIAGNOSIS — I10 Essential (primary) hypertension: Secondary | ICD-10-CM

## 2021-12-08 NOTE — Telephone Encounter (Signed)
Returned call to [place] s/w Sharyn Lull she states that Dr Volanda Napoleon previously rx'd lasix '20mg'$  BID(which pt never started), potassium 20 BID (which pt never started). Dr banks sent an rx for potassium '20mg'$  "As Directed" On 12-03-21 Dr Harl Bowie rx'd spironolactone  '25mg'$ . [name] did not start yet. Is it still ok to start?  Chlorthalidone was discontinued on 3-13 still ok to discontinue? [name] is wondering if the above is ok, Please advise on above.

## 2021-12-08 NOTE — Telephone Encounter (Signed)
Almyra Free with [place] is requesting to speak with Dr. Nelly Laurence nurse to clarify whether or not the patient is taking BP medication. She also mentions wanting to discuss a diuretic and potassium. She states they received prescriptions from another doctor's office and she just wants to clarify a few things if possible. Please return call to [place] at [phone].

## 2021-12-09 NOTE — Telephone Encounter (Signed)
Tried to call pt, phone just keeps ringing. Will try again later  Returned call to [place], [address], notified of Dr. Terrial Rhodes orders: This is Dr. Harriet Masson covering for Dr. Harl Bowie.  Please have the patient start Aldactone and hold potassium. Verbalized understanding.  She asks about Chlorthalidone as this is on pt's ([name]'s) med list. Informed that this is no longer on our med list. She will d/c. Pt will take  Spironolactone '25mg'$  QD and Lasix '20mg'$  BID per Dr Volanda Napoleon. Is this OK?

## 2021-12-11 ENCOUNTER — Encounter: Payer: Self-pay | Admitting: Family Medicine

## 2021-12-16 ENCOUNTER — Ambulatory Visit (INDEPENDENT_AMBULATORY_CARE_PROVIDER_SITE_OTHER): Payer: Medicare HMO | Admitting: *Deleted

## 2021-12-16 DIAGNOSIS — Z0000 Encounter for general adult medical examination without abnormal findings: Secondary | ICD-10-CM | POA: Diagnosis not present

## 2021-12-16 NOTE — Progress Notes (Signed)
Subjective:   [name] is a 71 y.o. male who presents for Medicare Annual/Subsequent preventive examination.  I connected with  [name] on 12/16/21 by a telephone enabled telemedicine application and verified that I am speaking with the correct person using two identifiers.   I discussed the limitations of evaluation and management by telemedicine. The patient expressed understanding and agreed to proceed.  Patient location: home  Provider location:  Tele-Health-home    Review of Systems     Cardiac Risk Factors include: hypertension;male gender;sedentary lifestyle;obesity (BMI >30kg/m2);family history of premature cardiovascular disease;advanced age (>30mn, >>49women)     Objective:    Today's Vitals   12/16/21 0832  PainSc: 8    There is no height or weight on file to calculate BMI.     12/16/2021    8:34 AM 03/17/2021   10:13 AM 12/24/2020    4:38 PM 10/01/2020    8:31 AM 01/01/2020    1:02 PM 10/11/2019    8:06 AM 09/20/2019   11:37 AM  Advanced Directives  Does Patient Have a Medical Advance Directive? No No No No No No No  Would patient like information on creating a medical advance directive? No - Patient declined No - Patient declined No - Patient declined No - Patient declined  No - Patient declined Yes (ED - Information included in AVS)    Current Medications (verified) Outpatient Encounter Medications as of 12/16/2021  Medication Sig   acetaminophen (TYLENOL) 325 MG tablet Take 2 tablets (650 mg total) by mouth every 6 (six) hours as needed for moderate pain or headache.   buPROPion (WELLBUTRIN SR) 200 MG 12 hr tablet TAKE 1 TABLET BY MOUTH DAILY   cyclobenzaprine (FLEXERIL) 5 MG tablet TAKE 1 TABLET BY MOUTH TWICE A DAY AS NEEDED FOR MUSCLE SPASMS   FEROSUL 325 (65 Fe) MG tablet TAKE 1 TABLET BY MOUTH THREE TIMES A DAY WITH MEALS   furosemide (LASIX) 20 MG tablet Take 1 tablet (20 mg total) by mouth 2 (two) times daily.   gabapentin  (NEURONTIN) 300 MG capsule Take by mouth.   hydrALAZINE (APRESOLINE) 25 MG tablet TAKE 1 TABLET BY MOUTH THREE TIMES A DAY   HYDROcodone-acetaminophen (NORCO/VICODIN) 5-325 MG tablet Take 2 tablets by mouth every 6 (six) hours as needed.   ibuprofen (ADVIL) 600 MG tablet Take by mouth.   lisinopril (ZESTRIL) 40 MG tablet Take 1 tablet by mouth daily.   magnesium oxide (MAG-OX) 400 MG tablet Take 400 mg by mouth daily.   Multiple Vitamin (MULTIVITAMINS PO) Take 1 tablet by mouth daily.   mupirocin ointment (BACTROBAN) 2 % APPLY THIN LAYER TO AFFECTED AREA AS DIRECTED BY YOUR MEDICAL PROVIDER APPLY TO CLEAN DRY SKIN   pantoprazole (PROTONIX) 40 MG tablet TAKE 1 TABLET BY MOUTH TWICE A DAY BEFORE MEALS   potassium chloride SA (KLOR-CON M) 20 MEQ tablet TAKE 1 TABLET BY MOUTH DAILY   predniSONE (DELTASONE) 10 MG tablet Take 5 tabs on day 1, 4 tabs on day 2, 3 tabs on day 3, 2 tabs on day 4, 1 tab on day 5.   QUEtiapine (SEROQUEL) 200 MG tablet Take 1 tablet (200 mg total) by mouth at bedtime.   senna-docusate (SENOKOT-S) 8.6-50 MG tablet Take 1 tablet by mouth 2 (two) times daily.   sertraline (ZOLOFT) 100 MG tablet Take 1 tablet (100 mg total) by mouth daily.   silver sulfADIAZINE (SILVADENE) 1 % cream Apply 1 application topically daily.  sodium chloride (OCEAN) 0.65 % SOLN nasal spray Place 1 spray into both nostrils as needed for congestion.   spironolactone (ALDACTONE) 25 MG tablet Take 1 tablet (25 mg total) by mouth daily.   thiamine 100 MG tablet Take by mouth.   No facility-administered encounter medications on file as of 12/16/2021.    Allergies (verified) Penicillins and Sudafed [pseudoephedrine]   History: Past Medical History:  Diagnosis Date   Arthritis    [name] ulcer, chronic 01/11/2018   Chronic back pain    GERD (gastroesophageal reflux disease)    uses Baking Soda   GIB (gastrointestinal bleeding) 2012   Glaucoma    right eye   Headache    occasionally   Hepatitis  C    Hiatal hernia with gastroesophageal reflux disease and esophagitis 01/05/2018   History of blood transfusion    no abnormal reaction noted   History of gout    doesn't take any meds   Hyperlipidemia    not on any meds   Insomnia    takes Ambien nightly   Ischemic colitis ([place]) 2012   Joint pain    Nocturia    Numbness    both legs occasionally   PONV (postoperative nausea and vomiting)    Prostate cancer (HCC)    Shortness of breath dyspnea    rarely but when notices he can be lying/sitting/exertion.Dr.Hochrein is aware per pt   Stroke [facility]) 2016    Urinary frequency    Urinary urgency    Past Surgical History:  Procedure Laterality Date   APPENDECTOMY     BIOPSY  01/11/2018   Procedure: BIOPSY;  Surgeon: Gatha Mayer, MD;  Location: [facility] ENDOSCOPY;  Service: Endoscopy;;   COLONOSCOPY     COLONOSCOPY N/A 10/26/2015   Procedure: COLONOSCOPY;  Surgeon: Doran Stabler, MD;  Location: [place];  Service: Endoscopy;  Laterality: N/A;   ESOPHAGOGASTRODUODENOSCOPY     ESOPHAGOGASTRODUODENOSCOPY N/A 10/26/2015   Procedure: ESOPHAGOGASTRODUODENOSCOPY (EGD);  Surgeon: Doran Stabler, MD;  Location[facility] ENDOSCOPY;  Service: Endoscopy;  Laterality: N/A;   ESOPHAGOGASTRODUODENOSCOPY (EGD) WITH PROPOFOL N/A 01/11/2018   Procedure: ESOPHAGOGASTRODUODENOSCOPY (EGD) WITH PROPOFOL;  Surgeon: Gatha Mayer, MD;  Location: [facility] ENDOSCOPY;  Service: Endoscopy;  Laterality: N/A;   ESOPHAGOGASTRODUODENOSCOPY (EGD) WITH PROPOFOL N/A 12/26/2020   Procedure: ESOPHAGOGASTRODUODENOSCOPY (EGD) WITH PROPOFOL;  Surgeon: Ladene Artist, MD;  Location: [facility] ENDOSCOPY;  Service: Endoscopy;  Laterality: N/A;   HOT HEMOSTASIS N/A 12/26/2020   Procedure: HOT HEMOSTASIS (ARGON PLASMA COAGULATION/BICAP);  Surgeon: Ladene Artist, MD;  Location: [facility] ENDOSCOPY;  Service: Endoscopy;  Laterality: N/A;   INGUINAL HERNIA REPAIR Right 11/04/2014   Procedure: RIGHT INGUINAL HERNIA REPAIR WITH MESH;  Surgeon: Jackolyn Confer, MD;  Location: [place];  Service: General;  Laterality: Right;   MASS EXCISION N/A 11/04/2014   Procedure: REMOVAL OF RIGHT GROIN SOFT TISSUE MASS;  Surgeon: Jackolyn Confer, MD;  Location: [place];  Service: General;  Laterality: N/A;   Multiple abdominal surgeries     total of 13   PROSTATECTOMY     SMALL INTESTINE SURGERY     Family History  Problem Relation Age of Onset   Alzheimer's disease Father    Cancer Mother        Lymph node   Heart failure Brother 38       Transplant 58 years ago   CAD Brother    Heart disease Sister 71       MI  Social History   Socioeconomic History   Marital status: Legally Separated    Spouse name: Not on file   Number of children: 2   Years of education: Not on file   Highest education level: Not on file  Occupational History   Occupation: retired  Tobacco Use   Smoking status: Every Day    Packs/day: 0.50    Years: 50.00    Pack years: 25.00    Types: Cigarettes   Smokeless tobacco: Never  Vaping Use   Vaping Use: Former  Substance and Sexual Activity   Alcohol use: Yes    Alcohol/week: 0.0 standard drinks    Comment: occasionally   Drug use: Not Currently    Frequency: 5.0 times per week    Types: Marijuana    Comment: "as often as I can get it"   Sexual activity: Yes  Other Topics Concern   Not on file  Social History Narrative   One living child .  One murdered.  Lives with girlfriend.     Social Determinants of Health   Financial Resource Strain: Low Risk    Difficulty of Paying Living Expenses: Not hard at all  Food Insecurity: No Food Insecurity   Worried About Charity fundraiser in the Last Year: Never true   Champion in the Last Year: Never true  Transportation Needs: No Transportation Needs   Lack of Transportation (Medical): No   Lack of Transportation (Non-Medical): No  Physical Activity: Inactive   Days of Exercise per Week: 0 days   Minutes of Exercise per Session: 0 min  Stress: No  Stress Concern Present   Feeling of Stress : Not at all  Social Connections: Socially Isolated   Frequency of Communication with Friends and Family: Once a week   Frequency of Social Gatherings with Friends and Family: Once a week   Attends Religious Services: More than 4 times per year   Active Member of [employer] or Organizations: No   Attends Archivist Meetings: Never   Marital Status: Separated    Tobacco Counseling Ready to quit: Not Answered Counseling given: Not Answered   Clinical Intake:  Pre-visit preparation completed: Yes  Pain : 0-10 Pain Score: 8  Pain Type: Chronic pain Pain Location: Leg Pain Orientation: Left, Right Pain Descriptors / Indicators: Constant, Burning, Aching Pain Onset: More than a month ago Pain Frequency: Constant Effect of Pain on Daily Activities: yes     Nutritional Risks: None Diabetes: No  How often do you need to have someone help you when you read instructions, pamphlets, or other written materials from your doctor or pharmacy?: 1 - Never  Diabetic?  no  Interpreter Needed?: No  Information entered by :: Leroy Kennedy LPN   Activities of Daily Living    12/16/2021    8:37 AM 12/25/2020   12:00 AM  In your present state of health, do you have any difficulty performing the following activities:  Hearing? 0 0  Vision? 0 0  Difficulty concentrating or making decisions? 0 0  Walking or climbing stairs? 1 1  Dressing or bathing? 0 0  Doing errands, shopping? 1 1  Preparing Food and eating ? Y   Comment has aid that comes in to help   Using the Toilet? N   In the past six months, have you accidently leaked urine? Y   Do you have problems with loss of bowel control? Y   Managing your Medications? N  Managing your Finances? N   Housekeeping or managing your Housekeeping? Y     Patient Care Team: Billie Ruddy, MD as PCP - General (Family Medicine) Viona Gilmore, [facility] as Pharmacist (Pharmacist)  Indicate any  recent Medical Services you may have received from other than [facility] providers in the past year (date may be approximate).     Assessment:   This is a routine wellness examination for Codie.  Hearing/Vision screen Hearing Screening - Comments:: No trouble hearing Vision Screening - Comments:: Not up to date  Dietary issues and exercise activities discussed: Current Exercise Habits: The patient does not participate in regular exercise at present, Exercise limited by: orthopedic condition(s)   Goals Addressed             This Visit's Progress    Patient Stated   Not on track    I want to be able to walk again      Patient Stated       Would like to be able to walk again with no pain in her legs       Depression Screen    12/16/2021    8:40 AM 07/15/2021   11:20 AM 10/01/2020    8:34 AM 03/05/2020    9:38 AM 12/25/2019    2:57 PM 09/19/2019    3:51 PM 09/19/2019   10:24 AM  PHQ 2/9 Scores  PHQ - 2 Score 0 0 4 0 '1 3 3  '$ PHQ- 9 Score   '17  9 14 14    '$ Fall Risk    12/16/2021    8:34 AM 07/15/2021   11:20 AM 10/01/2020    8:32 AM 03/05/2020    9:37 AM 12/05/2019   10:14 AM  [place] in the past year? 0 0 1  0  Comment    03/2020 "blacked out the the MD visit"   Number falls in past yr: 0 0 1    Injury with Fall? 0 0 0    Risk for fall due to : Impaired mobility  History of fall(s);Impaired mobility;Impaired balance/gait    Follow up Falls evaluation completed;Education provided;Falls prevention discussed  Falls evaluation completed;Falls prevention discussed      FALL RISK PREVENTION PERTAINING TO THE HOME:  Any stairs in or around the home? No  If so, are there any without handrails? No  Home free of loose throw rugs in walkways, pet beds, electrical cords, etc? Yes  Adequate lighting in your home to reduce risk of falls? Yes   ASSISTIVE DEVICES UTILIZED TO PREVENT FALLS:  Life alert? No  Use of a cane, walker or w/c? Yes  Grab bars in the bathroom? Yes   Shower chair or bench in shower? Yes  Elevated toilet seat or a handicapped toilet? Yes   TIMED UP AND GO:  Was the test performed? No .    Cognitive Function:        12/16/2021    8:34 AM 10/01/2020    8:42 AM  6CIT Screen  What Year? 0 points 0 points  What month? 3 points 3 points  What time? 0 points 3 points  Count back from 20 0 points 0 points  Months in reverse 4 points 4 points  Repeat phrase 8 points 10 points  Total Score 15 points 20 points    Immunizations Immunization History  Administered Date(s) Administered   Hepatitis B, adult 10/25/2016, 11/23/2016, 04/25/2017   Influenza,inj,Quad PF,6+ Mos 04/25/2017  Influenza-Unspecified 04/17/2007, 03/31/2011   Pneumococcal Polysaccharide-23 10/25/2016   Pneumococcal-Unspecified 03/31/2011   Tdap 01/03/2021    TDAP status: Up to date  Flu Vaccine status: Due, Education has been provided regarding the importance of this vaccine. Advised may receive this vaccine at local pharmacy or Health Dept. Aware to provide a copy of the vaccination record if obtained from local pharmacy or Health Dept. Verbalized acceptance and understanding.  Pneumococcal vaccine status: Up to date  Covid-19 vaccine status: Declined, Education has been provided regarding the importance of this vaccine but patient still declined. Advised may receive this vaccine at local pharmacy or Health Dept.or vaccine clinic. Aware to provide a copy of the vaccination record if obtained from local pharmacy or Health Dept. Verbalized acceptance and understanding.  Qualifies for Shingles Vaccine? Yes   Zostavax completed No   Shingrix Completed?: No.    Education has been provided regarding the importance of this vaccine. Patient has been advised to call insurance company to determine out of pocket expense if they have not yet received this vaccine. Advised may also receive vaccine at local pharmacy or Health Dept. Verbalized acceptance and  understanding.  Screening Tests Health Maintenance  Topic Date Due   COVID-19 Vaccine (1) 01/01/2022 (Originally 09/18/1951)   Zoster Vaccines- Shingrix (1 of 2) 03/18/2022 (Originally 03/21/1970)   Pneumonia Vaccine 59+ Years old (2 - PCV) 12/17/2022 (Originally 10/25/2017)   INFLUENZA VACCINE  02/16/2022   COLONOSCOPY (Pts 45-74yr Insurance coverage will need to be confirmed)  10/25/2025   TETANUS/TDAP  01/04/2031   Hepatitis C Screening  Completed   HPV VACCINES  Aged Out    Health Maintenance  There are no preventive care reminders to display for this patient.   Colorectal cancer screening: Type of screening: Colonoscopy. Completed 2017. Repeat every 10 years  Lung Cancer Screening: (Low Dose CT Chest recommended if Age 73-80years, 30 pack-year currently smoking OR have quit w/in 15years.) does not qualify.   Lung Cancer Screening Referral:   Additional Screening:  Hepatitis C Screening: does not qualify; Completed 2020  Vision Screening: Recommended annual ophthalmology exams for early detection of glaucoma and other disorders of the eye. Is the patient up to date with their annual eye exam?  No  Who is the provider or what is the name of the office in which the patient attends annual eye exams?  If pt is not established with a provider, would they like to be referred to a provider to establish care? No .   Dental Screening: Recommended annual dental exams for proper oral hygiene  Community Resource Referral / Chronic Care Management: CRR required this visit?  No   CCM required this visit?  No      Plan:     I have personally reviewed and noted the following in the patient's chart:   Medical and social history Use of alcohol, tobacco or illicit drugs  Current medications and supplements including opioid prescriptions. Patient is not currently taking opioid prescriptions. Functional ability and status Nutritional status Physical activity Advanced  directives List of other physicians Hospitalizations, surgeries, and ER visits in previous 12 months Vitals Screenings to include cognitive, depression, and falls Referrals and appointments  In addition, I have reviewed and discussed with patient certain preventive protocols, quality metrics, and best practice recommendations. A written personalized care plan for preventive services as well as general preventive health recommendations were provided to patient.     JLeroy Kennedy LPN   [id number]  Nurse Notes:

## 2021-12-16 NOTE — Patient Instructions (Signed)
[name] , Thank you for taking time to come for your Medicare Wellness Visit. I appreciate your ongoing commitment to your health goals. Please review the following plan we discussed and let me know if I can assist you in the future.   Screening recommendations/referrals: Colonoscopy: up to date Recommended yearly ophthalmology/optometry visit for glaucoma screening and checkup Recommended yearly dental visit for hygiene and checkup  Vaccinations: Influenza vaccine: Education provided Pneumococcal vaccine: up to date Tdap vaccine: up to date Shingles vaccine: Education provided    Advanced directives: Education provided  Conditions/risks identified:     Preventive Care 49 Years and Older, Male Preventive care refers to lifestyle choices and visits with your health care provider that can promote health and wellness. What does preventive care include? A yearly physical exam. This is also called an annual well check. Dental exams once or twice a year. Routine eye exams. Ask your health care provider how often you should have your eyes checked. Personal lifestyle choices, including: Daily care of your teeth and gums. Regular physical activity. Eating a healthy diet. Avoiding tobacco and drug use. Limiting alcohol use. Practicing safe sex. Taking low doses of aspirin every day. Taking vitamin and mineral supplements as recommended by your health care provider. What happens during an annual well check? The services and screenings done by your health care provider during your annual well check will depend on your age, overall health, lifestyle risk factors, and family history of disease. Counseling  Your health care provider may ask you questions about your: Alcohol use. Tobacco use. Drug use. Emotional well-being. Home and relationship well-being. Sexual activity. Eating habits. History of falls. Memory and ability to understand (cognition). Work and work  Statistician. Screening  You may have the following tests or measurements: Height, weight, and BMI. Blood pressure. Lipid and cholesterol levels. These may be checked every 5 years, or more frequently if you are over 72 years old. Skin check. Lung cancer screening. You may have this screening every year starting at age 56 if you have a 30-pack-year history of smoking and currently smoke or have quit within the past 15 years. Fecal occult blood test (FOBT) of the stool. You may have this test every year starting at age 24. Flexible sigmoidoscopy or colonoscopy. You may have a sigmoidoscopy every 5 years or a colonoscopy every 10 years starting at age 46. Prostate cancer screening. Recommendations will vary depending on your family history and other risks. Hepatitis C blood test. Hepatitis B blood test. Sexually transmitted disease (STD) testing. Diabetes screening. This is done by checking your blood sugar (glucose) after you have not eaten for a while (fasting). You may have this done every 1-3 years. Abdominal aortic aneurysm (AAA) screening. You may need this if you are a current or former smoker. Osteoporosis. You may be screened starting at age 38 if you are at high risk. Talk with your health care provider about your test results, treatment options, and if necessary, the need for more tests. Vaccines  Your health care provider may recommend certain vaccines, such as: Influenza vaccine. This is recommended every year. Tetanus, diphtheria, and acellular pertussis (Tdap, Td) vaccine. You may need a Td booster every 10 years. Zoster vaccine. You may need this after age 68. Pneumococcal 13-valent conjugate (PCV13) vaccine. One dose is recommended after age 67. Pneumococcal polysaccharide (PPSV23) vaccine. One dose is recommended after age 66. Talk to your health care provider about which screenings and vaccines you need and how often you  need them. This information is not intended to replace  advice given to you by your health care provider. Make sure you discuss any questions you have with your health care provider. Document Released: 08/01/2015 Document Revised: 03/24/2016 Document Reviewed: 05/06/2015 Elsevier Interactive Patient Education  2017 [employer] Prevention in the Home Falls can cause injuries. They can happen to people of all ages. There are many things you can do to make your home safe and to help prevent falls. What can I do on the outside of my home? Regularly fix the edges of walkways and driveways and fix any cracks. Remove anything that might make you trip as you walk through a door, such as a raised step or threshold. Trim any bushes or trees on the path to your home. Use bright outdoor lighting. Clear any walking paths of anything that might make someone trip, such as rocks or tools. Regularly check to see if handrails are loose or broken. Make sure that both sides of any steps have handrails. Any raised decks and porches should have guardrails on the edges. Have any leaves, snow, or ice cleared regularly. Use sand or salt on walking paths during winter. Clean up any spills in your garage right away. This includes oil or grease spills. What can I do in the bathroom? Use night lights. Install grab bars by the toilet and in the tub and shower. Do not use towel bars as grab bars. Use non-skid mats or decals in the tub or shower. If you need to sit down in the shower, use a plastic, non-slip stool. Keep the floor dry. Clean up any water that spills on the floor as soon as it happens. Remove soap buildup in the tub or shower regularly. Attach bath mats securely with double-sided non-slip rug tape. Do not have throw rugs and other things on the floor that can make you trip. What can I do in the bedroom? Use night lights. Make sure that you have a light by your bed that is easy to reach. Do not use any sheets or blankets that are too big for your bed.  They should not hang down onto the floor. Have a firm chair that has side arms. You can use this for support while you get dressed. Do not have throw rugs and other things on the floor that can make you trip. What can I do in the kitchen? Clean up any spills right away. Avoid walking on wet floors. Keep items that you use a lot in easy-to-reach places. If you need to reach something above you, use a strong step stool that has a grab bar. Keep electrical cords out of the way. Do not use floor polish or wax that makes floors slippery. If you must use wax, use non-skid floor wax. Do not have throw rugs and other things on the floor that can make you trip. What can I do with my stairs? Do not leave any items on the stairs. Make sure that there are handrails on both sides of the stairs and use them. Fix handrails that are broken or loose. Make sure that handrails are as long as the stairways. Check any carpeting to make sure that it is firmly attached to the stairs. Fix any carpet that is loose or worn. Avoid having throw rugs at the top or bottom of the stairs. If you do have throw rugs, attach them to the floor with carpet tape. Make sure that you have a light switch  at the top of the stairs and the bottom of the stairs. If you do not have them, ask someone to add them for you. What else can I do to help prevent falls? Wear shoes that: Do not have high heels. Have rubber bottoms. Are comfortable and fit you well. Are closed at the toe. Do not wear sandals. If you use a stepladder: Make sure that it is fully opened. Do not climb a closed stepladder. Make sure that both sides of the stepladder are locked into place. Ask someone to hold it for you, if possible. Clearly mark and make sure that you can see: Any grab bars or handrails. First and last steps. Where the edge of each step is. Use tools that help you move around (mobility aids) if they are needed. These  include: Canes. Walkers. Scooters. Crutches. Turn on the lights when you go into a dark area. Replace any light bulbs as soon as they burn out. Set up your furniture so you have a clear path. Avoid moving your furniture around. If any of your floors are uneven, fix them. If there are any pets around you, be aware of where they are. Review your medicines with your doctor. Some medicines can make you feel dizzy. This can increase your chance of falling. Ask your doctor what other things that you can do to help prevent falls. This information is not intended to replace advice given to you by your health care provider. Make sure you discuss any questions you have with your health care provider. Document Released: 05/01/2009 Document Revised: 12/11/2015 Document Reviewed: 08/09/2014 Elsevier Interactive Patient Education  2017 [employer].

## 2021-12-29 ENCOUNTER — Ambulatory Visit: Payer: Medicare HMO | Attending: Audiology | Admitting: Audiology

## 2021-12-29 DIAGNOSIS — H903 Sensorineural hearing loss, bilateral: Secondary | ICD-10-CM | POA: Diagnosis not present

## 2021-12-29 NOTE — Procedures (Signed)
  [facility] [place], [place]  [id number] [phone]  AUDIOLOGICAL  EVALUATION  NAME: [name]     DOB:   05/17/1951      MRN: [id number]                                                                                     DATE: 12/29/2021     REFERENT: Billie Ruddy, MD STATUS: Outpatient DIAGNOSIS: Sensorineural hearing loss, bilateral    History: Jaymison was seen for an audiological evaluation due to decreased hearing occurring for many years. Brownie denies otalgia, tinnitus, and dizziness. Marcellino reports bilateral aural fullness. Dionysios denies a history of noise exposure.   Evaluation:  Otoscopy showed a clear view of the tympanic membranes, bilaterally Tympanometry results were consistent with normal middle ear pressure and normal tympanic membrane mobility (Type A), bilaterally.  Audiometric testing was completed using Conventional Audiometry techniques with insert earphones and TDH headphones. Test results are consistent with a moderate to severe to profound sensorineural hearing loss, bilaterally. Asymmetry  noted at 2000 Hz, worse in the left ear and asymmetry noted at 8000 Hz, worse in the right ear. Speech Recognition Thresholds were obtained at 55 dB HL in the right ear and at 60  dB HL in the left ear. Word Recognition Testing was completed at 90 dB HL and Brelan scored 92%, bilaterally   Results:  Today's results are consistent with a moderate to severe to profound sensorineural hearing loss, bilaterally. Asymmetry  noted at 2000 Hz, worse in the left ear and asymmetry noted at 8000 Hz, worse in the right ear. Aquarius will have communication difficulty in all listening environments. He will benefit from the use of amplification and good communication strategies. The test results were reviewed with Shanon Brow. Brodric was encouraged to call his insurance company regarding a possible hearing aid benefit. Owyn was given a handout on  Lexicographer in the [facility].  Recommendations: 1.   Communication Needs Assessment with an Audiologist to discuss hearing aids 2.   Continue to monitor hearing sensitivity.    30 minutes spent testing and counseling on results.   If you have any questions please feel free to contact me at [phone].  Bari Mantis Audiologist, Au.D., CCC-A 12/29/2021  2:04 PM  Cc: Billie Ruddy, MD

## 2022-01-04 ENCOUNTER — Telehealth: Payer: Self-pay | Admitting: Family Medicine

## 2022-01-04 NOTE — Telephone Encounter (Signed)
Pt is calling back about his referral and want a call back

## 2022-01-05 NOTE — Telephone Encounter (Signed)
Pt was seen in April at   [facility], [place]   [place], [place] [id number]   [phone]     Notes from that visit states a referral was placed for Neuro. Pt should call number listed above to check on neuro referral.

## 2022-01-18 ENCOUNTER — Ambulatory Visit: Payer: Medicare HMO | Admitting: Family Medicine

## 2022-01-20 ENCOUNTER — Ambulatory Visit: Payer: Medicare HMO | Admitting: Family Medicine

## 2022-01-25 ENCOUNTER — Telehealth: Payer: Self-pay | Admitting: Family Medicine

## 2022-01-25 NOTE — Telephone Encounter (Signed)
Kieth Brightly from [employer] is calling to inquire about the fax sent on 01/21/22 regarding the repair for Pt's power wheelchair.   Please Fax completed documents to: [phone]

## 2022-01-26 NOTE — Telephone Encounter (Signed)
The form was given to Dr. Volanda Napoleon

## 2022-01-29 NOTE — Telephone Encounter (Signed)
Penny from [employer] called again following up on the status.  Please advise.

## 2022-02-01 ENCOUNTER — Telehealth: Payer: Self-pay | Admitting: Family Medicine

## 2022-02-01 ENCOUNTER — Telehealth: Payer: Self-pay

## 2022-02-01 NOTE — Telephone Encounter (Signed)
-  Caller states he has not been able to smell or taste for a year. Was referred to an ENT who stated they would send him instructions on a method to try, and they never did. Denies any new or worsening symptoms.  01/30/2022 5:20:06 [place] Yes Lolita Lenz, RN, [place]   Comments User: Victorino Dike, RN Date/Time Eilene Ghazi Time): 01/30/2022 5:10:18 PM Also has a cough the past 2-3 weeks. Denies fever.  User: Victorino Dike, RN Date/Time Eilene Ghazi Time): 01/30/2022 5:29:28 PM Patient given home care advice for a cough, denies any exposure to COVID stating he stays at home. Caller does state he is irritated with his doctor and was told by nurse to call Monday to speak with her regarding the referral and how he feels with the service she has provided for him. Denies any new or worsning symptoms and will call back if needed.  See OV notes from 11/30/21: Agnosia for taste -Reviewed notes from ENT.  Patient advised on neurology referral placed by ENT as work-up was negative for causes of loss of taste and smell. -Patient encouraged to contact ENT office regarding smell retraining information as patient states he never received this in the mail. -Continue follow-up with ENT.  Encouraged to answer the phone regarding neurology referral.   Agnosia for smell -as above -pt to schedule f/u with Neurology and f/u with ENT prn  Care Advice Given Per Guideline HOME CARE: * You should be able to treat this at home. REASSURANCE AND EDUCATION - COUGH: * Coughing is the way that our lungs remove irritants and mucus. It helps protect our lungs from getting pneumonia. * You can get a dry hacking cough after a chest cold. Sometimes this type of cough can last 1 to 3 weeks, and be worse at night. * You can also get a cough after being exposed to irritating substances like smoke, strong perfumes, and dust. * Here is some care advice that should help. HUMIDIFIER: * If the air is dry, use a humidifier in the bedroom.  * Dry air makes coughs worse. AVOID TOBACCO SMOKE: * Avoid smoke from tobacco and e-cigarettes. * Smoking or being exposed to smoke makes coughs much worse. COUGHING SPELLS: * Drink warm fluids. Inhale warm mist. This can help relax the airway and also loosen up phlegm. * Suck on cough drops or hard candy to coat the irritated throat. * Cough lasts over 3 weeks * Continuous coughing persists over 2 hours after cough treatment * Fever over 103 F (39.4 C) * Difficulty breathing occurs * You become worse REASSURANCE AND EDUCATION - COMMON COLD SYMPTOMS : CARE ADVICE given per Cough - Acute Non-Productive (Adult) guideline. GENERAL CARE ADVICE FOR GASTRIC REFLUX: * Avoid chocolate, mints, tomatoes, and citrus (e.g., oranges) * Avoid lying down for 3 or more hours after eating * Avoid or reduce caffeine (coffee, tea, colas) * Eat a low-fat diet (less than 45 grams of fat per day) * Elevate head of bed * Stop smoking CALL BACK IF: * You become worse

## 2022-02-01 NOTE — Telephone Encounter (Signed)
Form faxed

## 2022-02-01 NOTE — Telephone Encounter (Signed)
Form printed out.

## 2022-02-01 NOTE — Telephone Encounter (Signed)
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 Pt does not have another chair and it is imperative that this chair be repaired as soon as possible. Kieth Brightly is requesting that the forms be filled out and faxed back asap for the patient. Pls contact Penny at [phone] with an update.

## 2022-02-01 NOTE — Telephone Encounter (Signed)
Pt calling in upset, states he has called in multiple times for guidance on his loss of taste and smell. Says the ENT did not help him and he is reaching out to his PCP for help.

## 2022-02-02 NOTE — Telephone Encounter (Signed)
LVM for pt to return call

## 2022-02-04 ENCOUNTER — Other Ambulatory Visit: Payer: Self-pay

## 2022-02-04 NOTE — Telephone Encounter (Signed)
Left message for patient to call back  

## 2022-02-05 MED ORDER — SERTRALINE HCL 100 MG PO TABS: 100.0000 mg | ORAL_TABLET | Freq: Every day | ORAL | 6 refills | Status: AC

## 2022-02-09 ENCOUNTER — Telehealth: Payer: Self-pay | Admitting: Family Medicine

## 2022-02-09 NOTE — Telephone Encounter (Signed)
Pt requesting refill QUEtiapine (SEROQUEL) 200 MG tablet  [place], [place] - [address] Phone:  [phone]  Fax:  [phone]

## 2022-02-11 NOTE — Telephone Encounter (Signed)
Seroquel discussed at nauseum with pt at multiple office visits.  Please be advised Seroquel 200 mg previously stopped as patient reported it was not helping.  Patient advised to follow-up with for further help with insomnia.  This provider completely understands patient's frustration and would advise him to consider seeking help care elsewhere if unhappy.

## 2022-02-11 NOTE — Telephone Encounter (Signed)
Patient called to follow up on his refill for QUEtiapine (SEROQUEL) 200 MG tablet. I let patient know that the message had been sent back but we were waiting to see if Dr.Banks would fill it or if Dr.Swartz would have it. Patient states that he does not see Dr.Swartz, as he only seen him in the hospital. Patient stated that if he does not get his medication then he may have to find another primary provider, as he is not happy with Dr.Banks currently.     Please advise

## 2022-02-12 ENCOUNTER — Encounter: Payer: Self-pay | Admitting: Family Medicine

## 2022-02-12 ENCOUNTER — Ambulatory Visit (INDEPENDENT_AMBULATORY_CARE_PROVIDER_SITE_OTHER): Payer: Medicare Other | Admitting: Family Medicine

## 2022-02-12 ENCOUNTER — Telehealth: Payer: Self-pay | Admitting: Family Medicine

## 2022-02-12 ENCOUNTER — Other Ambulatory Visit: Payer: Self-pay | Admitting: Family Medicine

## 2022-02-12 DIAGNOSIS — F5101 Primary insomnia: Secondary | ICD-10-CM

## 2022-02-12 DIAGNOSIS — I10 Essential (primary) hypertension: Secondary | ICD-10-CM

## 2022-02-12 DIAGNOSIS — F419 Anxiety disorder, unspecified: Secondary | ICD-10-CM

## 2022-02-12 MED ORDER — QUETIAPINE FUMARATE 200 MG PO TABS: 200.0000 mg | ORAL_TABLET | Freq: Every day | ORAL | 0 refills | Status: DC

## 2022-02-12 NOTE — Patient Instructions (Addendum)
Upon contacting your pharmacy it appears that Dr. Tessa Lerner from [facility] re[facility] was providing refills on Seroquel.  You last saw him on 07/15/2021 for an office visit.  The pharmacy states they sent you a letter stating you are out of refills and they would reach out to the prescribing provider, Dr. Tessa Lerner, not this provider.  As discussed during the visit it is best that you find a new PCP given the continued breakdown in patient/provider relationship.  A 30-day limited supply of Seroquel was sent to your pharmacy.

## 2022-02-12 NOTE — Progress Notes (Signed)
Subjective:    Patient ID: [name], male    DOB: [birth year], 71 y.o.   MRN: [id number]  Chief Complaint  Patient presents with   Nasal Congestion    Pt c/o of runny nose, loss of taste and smell. Been going on. Need a new referral   Medication Refill    Seroquel    HPI Patient is a 71 year old male with pmh sig for HLD, hiatal hernia with GERD, history of CVA, HTN,, carotid aneurysm, hep C, OA of left hip, LE edema, iron deficiency anemia, ansomnia, insomnia who was seen today for chronic concerns.  Pt requesting refill on Seroquel 200 mg.  Called office earlier this wk upset about refill not being sent and stated perhaps he should find a new provider.  Per chart review, med previously rx'd by [facility].  In the past med was d/c'd as pt stated it was no longer working.  Pt states he has been getting seroquel until a month or so ago from his pharmacy.  States the "doctor from the hospital was giving it to me".  Pt states he has not slept in the last 3 wks.  Pt becomes loud with this provider stating "why aren't you refilling my medicine and if you won't maybe I need a new provider".  This provider advised patient to lower tone or provider would leave room and not return.  Advised patient this provider will contact patient's pharmacy to see if the medication was being filled and who was prescribing it.  Past Medical History:  Diagnosis Date   Arthritis    [name] ulcer, chronic 01/11/2018   Chronic back pain    GERD (gastroesophageal reflux disease)    uses Baking Soda   GIB (gastrointestinal bleeding) 2012   Glaucoma    right eye   Headache    occasionally   Hepatitis C    Hiatal hernia with gastroesophageal reflux disease and esophagitis 01/05/2018   History of blood transfusion    no abnormal reaction noted   History of gout    doesn't take any meds   Hyperlipidemia    not on any meds   Insomnia    takes Ambien nightly   Ischemic colitis ([place]) 2012   Joint pain    Nocturia     Numbness    both legs occasionally   PONV (postoperative nausea and vomiting)    Prostate cancer (HCC)    Shortness of breath dyspnea    rarely but when notices he can be lying/sitting/exertion.[name] is aware per pt   Stroke [facility]) 2016    Urinary frequency    Urinary urgency     Allergies  Allergen Reactions   Penicillins Anaphylaxis    Has patient had a PCN reaction causing immediate rash, facial/tongue/throat swelling, SOB or lightheadedness with hypotension: Yes Has patient had a PCN reaction causing severe rash involving mucus membranes or skin necrosis: No Has patient had a PCN reaction that required hospitalization: No Has patient had a PCN reaction occurring within the last 10 years: No If all of the above answers are "NO", then may proceed with Cephalosporin use.[name] (See Comments)    Heart "races"    ROS General: Denies fever, chills, night sweats, changes in weight, changes in appetite HEENT: Denies headaches, ear pain, changes in vision, rhinorrhea, sore throat CV: Denies CP, palpitations, SOB, orthopnea Pulm: Denies SOB, cough, wheezing GI: Denies abdominal pain, nausea, vomiting, diarrhea, constipation GU: Denies dysuria, hematuria,  frequency Msk: Denies muscle cramps, joint pains Neuro: Denies weakness, numbness, tingling Skin: Denies rashes, bruising Psych: Denies depression, anxiety, hallucinations  +insomnia     Objective:    Blood pressure (!) 146/86, pulse 74, temperature 99.1 F (37.3 C), temperature source Oral, weight 199 lb 6.4 oz (90.4 kg), SpO2 97 %.  Gen. Pleasant, well-nourished, in no distress, normal affect   HEENT: [place]/AT, face symmetric, conjunctiva clear, no scleral icterus, PERRLA, EOMI, nares patent without drainage Lungs: no accessory muscle use Cardiovascular: RRR Neuro: Alert, CN II-XII grossly intact, gait not assessed as sitting in motorized wheelchair Skin:  Warm, no lesions/  rash   Wt Readings from Last 3 Encounters:  02/12/22 199 lb 6.4 oz (90.4 kg)  12/03/21 187 lb 6.4 oz (85 kg)  03/17/21 169 lb (76.7 kg)    Lab Results  Component Value Date   WBC 5.3 09/28/2021   HGB 8.1 Repeated and verified X2. (L) 09/28/2021   HCT 28.0 Repeated and verified X2. (L) 09/28/2021   PLT 501.0 (H) 09/28/2021   GLUCOSE 91 09/28/2021   CHOL 108 06/17/2021   TRIG 104.0 06/17/2021   HDL 39.80 06/17/2021   LDLCALC 47 06/17/2021   ALT 7 09/28/2021   AST 10 09/28/2021   NA 141 09/28/2021   K 4.1 09/28/2021   CL 105 09/28/2021   CREATININE 0.92 09/28/2021   BUN 10 09/28/2021   CO2 27 09/28/2021   TSH 1.32 09/28/2021   INR 1.4 (H) 09/28/2021   HGBA1C 4.7 (L) 07/22/2018    Assessment/Plan:  Primary insomnia  - Plan: QUEtiapine (SEROQUEL) 200 MG tablet  This provider contaced pt's pharmacy, [place].  Per pharmacy "Seroquel 200 mg was refilled by [name] with refills.  Pt should have run out last month.  Pt was sent a letter in mail stating he was out and a refill request was sent to the prescribing provider without response.  Per pharmacy, pt never wants med in pill pack but as loose pills."  Upon further review of chart,  Seroquel 200 mg on med list with start date 11/10/2020, dispense 30 tablets, 0 refills.  On further reconciliation of meds, a new Rx was written on 09/08/2021 with 3 refills by Dr [name].  A limited 1 month supply of Seroquel 200 mg sent to patient's pharmacy.  Based on patient's behavior this visit and recent phone call, pt advised that perhaps it is best for him to find a new PCP.  Follow-up as needed with new PCP  Jonathon Mitts, MD

## 2022-02-12 NOTE — Telephone Encounter (Signed)
disregard

## 2022-02-16 NOTE — Telephone Encounter (Signed)
Patient was seen with Dr. Volanda Napoleon on 02/12/2022

## 2022-02-17 ENCOUNTER — Telehealth: Payer: Self-pay | Admitting: Family Medicine

## 2022-02-17 DIAGNOSIS — L89321 Pressure ulcer of left buttock, stage 1: Secondary | ICD-10-CM

## 2022-02-17 DIAGNOSIS — T310 Burns involving less than 10% of body surface: Secondary | ICD-10-CM

## 2022-02-17 DIAGNOSIS — T24211D Burn of second degree of right thigh, subsequent encounter: Secondary | ICD-10-CM

## 2022-02-17 NOTE — Telephone Encounter (Signed)
Calling in requesting a referral to wound care specialist for open wound on his bottom. States he has been seen by provider for this previously when offered an OV

## 2022-02-23 NOTE — Telephone Encounter (Signed)
[place] for referral to wound care.

## 2022-02-24 NOTE — Addendum Note (Signed)
Addended by: Anderson Malta on: 02/24/2022 02:52 PM   Modules accepted: Orders

## 2022-03-03 ENCOUNTER — Other Ambulatory Visit: Payer: Self-pay | Admitting: Family Medicine

## 2022-03-03 DIAGNOSIS — D509 Iron deficiency anemia, unspecified: Secondary | ICD-10-CM | POA: Diagnosis not present

## 2022-03-03 DIAGNOSIS — Z1159 Encounter for screening for other viral diseases: Secondary | ICD-10-CM | POA: Diagnosis not present

## 2022-03-03 DIAGNOSIS — G894 Chronic pain syndrome: Secondary | ICD-10-CM | POA: Diagnosis not present

## 2022-03-03 DIAGNOSIS — F17210 Nicotine dependence, cigarettes, uncomplicated: Secondary | ICD-10-CM | POA: Diagnosis not present

## 2022-03-03 DIAGNOSIS — E559 Vitamin D deficiency, unspecified: Secondary | ICD-10-CM | POA: Diagnosis not present

## 2022-03-03 DIAGNOSIS — E261 Secondary hyperaldosteronism: Secondary | ICD-10-CM | POA: Diagnosis not present

## 2022-03-03 DIAGNOSIS — E785 Hyperlipidemia, unspecified: Secondary | ICD-10-CM | POA: Diagnosis not present

## 2022-03-03 DIAGNOSIS — Z79899 Other long term (current) drug therapy: Secondary | ICD-10-CM | POA: Diagnosis not present

## 2022-03-03 DIAGNOSIS — G4700 Insomnia, unspecified: Secondary | ICD-10-CM | POA: Diagnosis not present

## 2022-03-10 ENCOUNTER — Other Ambulatory Visit: Payer: Self-pay | Admitting: *Deleted

## 2022-03-10 NOTE — Patient Outreach (Addendum)
  Care Coordination   03/10/2022 Name: [name] MRN: [id number] DOB: 1950/12/22   Care Coordination Outreach Attempts:  An unsuccessful telephone outreach was attempted today to offer the patient information about available care coordination services as a benefit of their health plan.  Call ended with pt prior to any assessment. Pt states he is no longer with Dr. Volanda Napoleon.  Follow Up Plan:  Additional outreach attempts will be made to offer the patient care coordination information and services.   Encounter Outcome:  No Answer  Care Coordination Interventions Activated:  No   Care Coordination Interventions:  No, not indicated    Raina Mina, RN Care Management Coordinator [place] Office [phone]

## 2022-03-11 ENCOUNTER — Encounter (HOSPITAL_BASED_OUTPATIENT_CLINIC_OR_DEPARTMENT_OTHER): Payer: Medicare Other | Attending: Internal Medicine | Admitting: Internal Medicine

## 2022-03-11 DIAGNOSIS — L910 Hypertrophic scar: Secondary | ICD-10-CM

## 2022-03-11 DIAGNOSIS — I10 Essential (primary) hypertension: Secondary | ICD-10-CM | POA: Diagnosis not present

## 2022-03-11 NOTE — Progress Notes (Signed)
[name] ([id number]) Visit Report for 03/11/2022 Allergy List Details Patient Name: Date of Service: [name] ID L. 03/11/2022 9:45 A M Medical Record Number: [id number] Patient Account Number: [id number] Date of Birth/Sex: Treating RN: [birth year] (71 y.o. [name] Primary Care [name]: [name] Other Clinician: Referring [name]: Treating [name]/Extender: [name] in Treatment: 0 Allergies Active Allergies No Known Allergies Allergy Notes Electronic Signature(s) Signed: 03/11/2022 3:54:54 PM By: Deon Pilling RN, BSN Entered By: Deon Pilling on 03/10/2022 11:45:58 -------------------------------------------------------------------------------- Arrival Information Details Patient Name: Date of Service: [name], DA V ID L. 03/11/2022 9:45 A M Medical Record Number: [id number] Patient Account Number: [id number] Date of Birth/Sex: Treating RN: 1950-09-01 (71 y.o. [name], [name] Primary Care [name]: [name] Other Clinician: Referring [name]: Treating [name]/Extender: [name] in Treatment: 0 Visit Information Patient Arrived: Wheel Chair Arrival Time: 08:57 Accompanied By: self Transfer Assistance: Manual Patient Identification Verified: Yes Secondary Verification Process Completed: Yes Patient Requires Transmission-Based Precautions: No Patient Has Alerts: No History Since Last Visit Added or deleted any medications: No Any new allergies or adverse reactions: No Had a fall or experienced change in activities of daily living that may affect risk of falls: No Signs or symptoms of abuse/neglect since last visito No Hospitalized since last visit: No Implantable device outside of the clinic excluding cellular tissue based products placed in the center since last visit: No Pain Present Now: Yes Electronic Signature(s) Signed: 03/11/2022 10:35:58 AM By: [name] Entered  By: [name] on 03/11/2022 08:58:18 -------------------------------------------------------------------------------- Clinic Level of Care Assessment Details Patient Name: Date of Service: [name] ID L. 03/11/2022 9:45 A M Medical Record Number: [id number] Patient Account Number: [id number] Date of Birth/Sex: Treating RN: 03/12/1951 (71 y.o. [name] Primary Care Daneka Lantigua: [name] Other Clinician: Referring [name]: Treating [name]/Extender: [name] in Treatment: 0 Clinic Level of Care Assessment Items TOOL 2 Quantity Score X- 1 0 Use when only an EandM is performed on the INITIAL visit ASSESSMENTS - Nursing Assessment / Reassessment X- 1 20 General Physical Exam (combine w/ comprehensive assessment (listed just below) when performed on new pt. evals) X- 1 25 Comprehensive Assessment (HX, ROS, Risk Assessments, Wounds Hx, etc.) ASSESSMENTS - Wound and Skin A ssessment / Reassessment '[]'$  - 0 Simple Wound Assessment / Reassessment - one wound '[]'$  - 0 Complex Wound Assessment / Reassessment - multiple wounds X- 1 10 Dermatologic / Skin Assessment (not related to wound area) ASSESSMENTS - Ostomy and/or Continence Assessment and Care '[]'$  - 0 Incontinence Assessment and Management '[]'$  - 0 Ostomy Care Assessment and Management (repouching, etc.) PROCESS - Coordination of Care X - Simple Patient / Family Education for ongoing care 1 15 '[]'$  - 0 Complex (extensive) Patient / Family Education for ongoing care X- 1 10 Staff obtains Programmer, systems, Records, T Results / Process Orders est '[]'$  - 0 Staff telephones HHA, Nursing Homes / Clarify orders / etc '[]'$  - 0 Routine Transfer to another Facility (non-emergent condition) '[]'$  - 0 Routine Hospital Admission (non-emergent condition) '[]'$  - 0 New Admissions / Biomedical engineer / Ordering NPWT Apligraf, etc. , '[]'$  - 0 Emergency Hospital Admission (emergent condition) X- 1 10 Simple  Discharge Coordination '[]'$  - 0 Complex (extensive) Discharge Coordination PROCESS - Special Needs '[]'$  - 0 Pediatric / Minor Patient Management '[]'$  - 0 Isolation Patient Management '[]'$  - 0 Hearing / Language / Visual special needs '[]'$  - 0 Assessment of  Community assistance (transportation, D/C planning, etc.) '[]'$  - 0 Additional assistance / Altered mentation '[]'$  - 0 Support Surface(s) Assessment (bed, cushion, seat, etc.) INTERVENTIONS - Wound Cleansing / Measurement '[]'$  - 0 Wound Imaging (photographs - any number of wounds) '[]'$  - 0 Wound Tracing (instead of photographs) '[]'$  - 0 Simple Wound Measurement - one wound '[]'$  - 0 Complex Wound Measurement - multiple wounds '[]'$  - 0 Simple Wound Cleansing - one wound '[]'$  - 0 Complex Wound Cleansing - multiple wounds INTERVENTIONS - Wound Dressings '[]'$  - 0 Small Wound Dressing one or multiple wounds '[]'$  - 0 Medium Wound Dressing one or multiple wounds '[]'$  - 0 Large Wound Dressing one or multiple wounds '[]'$  - 0 Application of Medications - injection INTERVENTIONS - Miscellaneous '[]'$  - 0 External ear exam '[]'$  - 0 Specimen Collection (cultures, biopsies, blood, body fluids, etc.) '[]'$  - 0 Specimen(s) / Culture(s) sent or taken to Lab for analysis '[]'$  - 0 Patient Transfer (multiple staff / Civil Service fast streamer / Similar devices) '[]'$  - 0 Simple Staple / Suture removal (25 or less) '[]'$  - 0 Complex Staple / Suture removal (26 or more) '[]'$  - 0 Hypo / Hyperglycemic Management (close monitor of Blood Glucose) '[]'$  - 0 Ankle / Brachial Index (ABI) - do not check if billed separately Has the patient been seen at the hospital within the last three years: Yes Total Score: 90 Level Of Care: New/Established - Level 3 Electronic Signature(s) Signed: 03/11/2022 3:54:54 PM By: Deon Pilling RN, BSN Entered By: Deon Pilling on 03/11/2022 09:26:01 -------------------------------------------------------------------------------- Encounter Discharge Information  Details Patient Name: Date of Service: [name], DA V ID L. 03/11/2022 9:45 A M Medical Record Number: [id number] Patient Account Number: [id number] Date of Birth/Sex: Treating RN: 10-Apr-1951 (71 y.o. [name] Primary Care Clydie Dillen: [name] Other Clinician: Referring Jakyiah Briones: Treating Moyinoluwa Dawe/Extender: Orvil Feil in Treatment: 0 Encounter Discharge Information Items Discharge Condition: Stable Ambulatory Status: Wheelchair Discharge Destination: Home Transportation: Private Auto Accompanied By: self Schedule Follow-up Appointment: No Clinical Summary of Care: Electronic Signature(s) Signed: 03/11/2022 3:54:54 PM By: Deon Pilling RN, BSN Entered By: Deon Pilling on 03/11/2022 09:27:55 -------------------------------------------------------------------------------- Lower Extremity Assessment Details Patient Name: Date of Service: [name], Shaune Pascal ID L. 03/11/2022 9:45 A M Medical Record Number: [id number] Patient Account Number: [id number] Date of Birth/Sex: Treating RN: 1951/07/07 (71 y.o. [name] Primary Care Jarrah Seher: [name] Other Clinician: Referring Deniss Wormley: Treating Clorinda Wyble/Extender: [name] in Treatment: 0 Electronic Signature(s) Signed: 03/11/2022 10:35:58 AM By: [name] Signed: 03/11/2022 3:54:54 PM By: Deon Pilling RN, BSN Entered By: [name] on 03/11/2022 09:13:19 -------------------------------------------------------------------------------- Multi Wound Chart Details Patient Name: Date of Service: [name], DA V ID L. 03/11/2022 9:45 A M Medical Record Number: [id number] Patient Account Number: [id number] Date of Birth/Sex: Treating RN: 07-26-1950 (71 y.o. [name] Primary Care Zaylie Gisler: [name] Other Clinician: Referring Nickalos Petersen: Treating Jin Capote/Extender: [name] in Treatment: 0 Vital Signs Height(in):  69 Pulse(bpm): 61 Weight(lbs): 190 Blood Pressure(mmHg): 180/90 Body Mass Index(BMI): 28.1 Temperature(F): 98.3 Respiratory Rate(breaths/min): 18 Wound Assessments Treatment Notes Electronic Signature(s) Signed: 03/11/2022 10:45:22 AM By: Kalman Shan DO Signed: 03/11/2022 3:54:54 PM By: Deon Pilling RN, BSN Entered By: Kalman Shan on 03/11/2022 10:37:49 -------------------------------------------------------------------------------- Multi-Disciplinary Care Plan Details Patient Name: Date of Service: [name], DA V ID L. 03/11/2022 9:45 A M Medical Record Number: [id number] Patient Account Number: [id number] Date of Birth/Sex: Treating RN: 18-Aug-1950 (71 y.o. [name], Tammi Klippel Primary  Care Sven Pinheiro: [name] Other Clinician: Referring Jeyda Siebel: Treating Atlas Kuc/Extender: [name] in Treatment: 0 Active Inactive Electronic Signature(s) Signed: 03/11/2022 3:54:54 PM By: Deon Pilling RN, BSN Entered By: Deon Pilling on 03/11/2022 09:25:21 -------------------------------------------------------------------------------- Pain Assessment Details Patient Name: Date of Service: [name], DA V ID L. 03/11/2022 9:45 A M Medical Record Number: [id number] Patient Account Number: [id number] Date of Birth/Sex: Treating RN: 04-15-1951 (71 y.o. [name] Primary Care Elia Nunley: [name] Other Clinician: Referring Harmonii Karle: Treating Christopherjohn Schiele/Extender: [name] in Treatment: 0 Active Problems Location of Pain Severity and Description of Pain Patient Has Paino Yes Site Locations Rate the pain. Current Pain Level: 7 Pain Management and Medication Current Pain Management: Electronic Signature(s) Signed: 03/11/2022 10:35:58 AM By: [name] Signed: 03/11/2022 3:54:54 PM By: Deon Pilling RN, BSN Entered By: [name] on 03/11/2022  09:21:34 -------------------------------------------------------------------------------- Patient/Caregiver Education Details Patient Name: Date of Service: [name], Shaune Pascal ID L. 8/24/2023andnbsp9:45 A M Medical Record Number: [id number] Patient Account Number: [id number] Date of Birth/Gender: Treating RN: 13-Mar-1951 (71 y.o. [name] Primary Care Physician: [name] Other Clinician: Referring Physician: Treating Physician/Extender: Orvil Feil in Treatment: 0 Education Assessment Education Provided To: Patient Education Topics Provided Welcome T [facility] [place]: o Handouts: Welcome T [facility] [place] o Methods: Explain/Verbal Responses: Reinforcements needed Electronic Signature(s) Signed: 03/11/2022 3:54:54 PM By: Deon Pilling RN, BSN Signed: 03/11/2022 3:54:54 PM By: Deon Pilling RN, BSN Entered By: Deon Pilling on 03/11/2022 09:24:40 -------------------------------------------------------------------------------- Vitals Details Patient Name: Date of Service: [name], DA V ID L. 03/11/2022 9:45 A M Medical Record Number: [id number] Patient Account Number: [id number] Date of Birth/Sex: Treating RN: [birth year] (70 y.o. [name] Primary Care Deunta Beneke: [name] Other Clinician: Referring Beadie Matsunaga: Treating Elmar Antigua/Extender: [name] in Treatment: 0 Vital Signs Time Taken: 09:12 Temperature (F): 98.3 Height (in): 69 Pulse (bpm): 61 Source: Stated Respiratory Rate (breaths/min): 18 Weight (lbs): 190 Blood Pressure (mmHg): 180/90 Source: Stated Reference Range: 80 - 120 mg / dl Body Mass Index (BMI): 28.1 Airway Pulse Oximetry (%): 97 Electronic Signature(s) Signed: 03/11/2022 10:35:58 AM By: [name] Entered By: [name] on 03/11/2022 09:15:28
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
## 2022-03-11 NOTE — Progress Notes (Signed)
ZENITH, LAMPHIER ([id number]) Visit Report for 03/11/2022 Abuse Risk Screen Details Patient Name: Date of Service: Su Monks ID L. 03/11/2022 9:45 A M Medical Record Number: [id number] Patient Account Number: [id number] Date of Birth/Sex: Treating RN: May 30, 1951 (71 y.o. [name] Primary Care Lunden Mcleish: [name] Other Clinician: Referring Damariz Paganelli: Treating Dereon Williamsen/Extender: Earl Gala Weeks in Treatment: 0 Abuse Risk Screen Items Answer ABUSE RISK SCREEN: Has anyone close to you tried to hurt or harm you recentlyo No Do you feel uncomfortable with anyone in your familyo No Has anyone forced you do things that you didnt want to doo No Electronic Signature(s) Signed: 03/11/2022 10:35:58 AM By: Erenest Blank Signed: 03/11/2022 3:54:54 PM By: Deon Pilling RN, BSN Entered By: Erenest Blank on 03/11/2022 09:08:11 -------------------------------------------------------------------------------- Activities of Daily Living Details Patient Name: Date of Service: [name], DA V ID L. 03/11/2022 9:45 A M Medical Record Number: [id number] Patient Account Number: [id number] Date of Birth/Sex: Treating RN: Dec 05, 1950 (71 y.o. [name], Tammi Klippel Primary Care Destinie Thornsberry: [name] Other Clinician: Referring Shaul Trautman: Treating Azar South/Extender: Earl Gala Weeks in Treatment: 0 Activities of Daily Living Items Answer Activities of Daily Living (Please select one for each item) Drive Automobile Not Able T Medications ake Need Assistance Use T elephone Need Assistance Care for Appearance Completely Able Use T oilet Completely Able Bath / Shower Completely Able Dress Self Completely Able Feed Self Completely Able Walk Not Able Get In / Out Bed Need Assistance Housework Need Assistance Prepare Meals Need Assistance Handle Money Completely Able Shop for Self Need Assistance Electronic Signature(s) Signed: 03/11/2022 10:35:58 AM  By: Erenest Blank Signed: 03/11/2022 3:54:54 PM By: Deon Pilling RN, BSN Entered By: Erenest Blank on 03/11/2022 09:09:05 -------------------------------------------------------------------------------- Education Screening Details Patient Name: Date of Service: [name], DA V ID L. 03/11/2022 9:45 A M Medical Record Number: [id number] Patient Account Number: [id number] Date of Birth/Sex: Treating RN: 18-May-1951 (71 y.o. [name] Primary Care Saga Balthazar: [name] Other Clinician: Referring Elianys Conry: Treating Jamiyah Dingley/Extender: Orvil Feil in Treatment: 0 Primary Learner Assessed: Patient Learning Preferences/Education Level/Primary Language Learning Preference: Explanation, Demonstration, Printed Material Highest Education Level: High School Preferred Language: English Cognitive Barrier Language Barrier: No Translator Needed: No Memory Deficit: No Emotional Barrier: No Cultural/Religious Beliefs Affecting Medical Care: No Physical Barrier Impaired Vision: Yes Glasses, readers Impaired Hearing: No Decreased Hand dexterity: No Knowledge/Comprehension Knowledge Level: High Comprehension Level: High Ability to understand written instructions: High Ability to understand verbal instructions: High Motivation Anxiety Level: Calm Cooperation: Cooperative Education Importance: Acknowledges Need Interest in Health Problems: Asks Questions Perception: Coherent Willingness to Engage in Self-Management High Activities: Readiness to Engage in Self-Management High Activities: Electronic Signature(s) Signed: 03/11/2022 10:35:58 AM By: Erenest Blank Signed: 03/11/2022 3:54:54 PM By: Deon Pilling RN, BSN Entered By: Erenest Blank on 03/11/2022 09:10:02 -------------------------------------------------------------------------------- Fall Risk Assessment Details Patient Name: Date of Service: [name], DA V ID L. 03/11/2022 9:45 A M Medical  Record Number: [id number] Patient Account Number: [id number] Date of Birth/Sex: Treating RN: 07-29-1950 (70 y.o. [name] Primary Care Mckenzy Salazar: [name] Other Clinician: Referring Authur Cubit: Treating Starlette Thurow/Extender: Earl Gala Weeks in Treatment: 0 Fall Risk Assessment Items Have you had 2 or more falls in the last 12 monthso 0 No Have you had any fall that resulted in injury in the last 12 monthso 0 No FALLS RISK SCREEN History of falling - immediate or within 3 months 0 No Secondary diagnosis (Do you have 2  or more medical diagnoseso) 0 No Ambulatory aid None/bed rest/wheelchair/nurse 0 Yes Crutches/cane/walker 0 No Furniture 0 No Intravenous therapy Access/Saline/Heparin Lock 0 No Gait/Transferring Normal/ bed rest/ wheelchair 0 Yes Weak (short steps with or without shuffle, stooped but able to lift head while walking, may seek 0 No support from furniture) Impaired (short steps with shuffle, may have difficulty arising from chair, head down, impaired 0 No balance) Mental Status Oriented to own ability 0 Yes Electronic Signature(s) Signed: 03/11/2022 10:35:58 AM By: Erenest Blank Signed: 03/11/2022 3:54:54 PM By: Deon Pilling RN, BSN Entered By: Erenest Blank on 03/11/2022 09:10:37 -------------------------------------------------------------------------------- Foot Assessment Details Patient Name: Date of Service: [name], DA V ID L. 03/11/2022 9:45 A M Medical Record Number: [id number] Patient Account Number: [id number] Date of Birth/Sex: Treating RN: [birth year] (72 y.o. [name] Primary Care Alvah Lagrow: [name] Other Clinician: Referring Sorina Derrig: Treating Elija Mccamish/Extender: Earl Gala Weeks in Treatment: 0 Foot Assessment Items Site Locations + = Sensation present, - = Sensation absent, C = Callus, U = Ulcer R = Redness, W = Warmth, M = Maceration, PU = Pre-ulcerative lesion F = Fissure, S =  Swelling, D = Dryness Assessment Right: Left: Other Deformity: No No Prior Foot Ulcer: No No Prior Amputation: No No Charcot Joint: No No Ambulatory Status: Non-ambulatory Assistance Device: Wheelchair Gait: Unsteady Notes sacrum wound Electronic Signature(s) Signed: 03/11/2022 10:35:58 AM By: Erenest Blank Signed: 03/11/2022 3:54:54 PM By: Deon Pilling RN, BSN Entered By: Erenest Blank on 03/11/2022 09:12:11 -------------------------------------------------------------------------------- Nutrition Risk Screening Details Patient Name: Date of Service: [name], DA V ID L. 03/11/2022 9:45 A M Medical Record Number: [id number] Patient Account Number: [id number] Date of Birth/Sex: Treating RN: [birth year] (71 y.o. [name] Primary Care Meridee Branum: [name] Other Clinician: Referring Lawonda Pretlow: Treating Dartagnan Beavers/Extender: Earl Gala Weeks in Treatment: 0 Height (in): Weight (lbs): Body Mass Index (BMI): Nutrition Risk Screening Items Score Screening NUTRITION RISK SCREEN: I have an illness or condition that made me change the kind and/or amount of food I eat 2 Yes I eat fewer than two meals per day 3 Yes I eat few fruits and vegetables, or milk products 0 No I have three or more drinks of beer, liquor or wine almost every day 0 No I have tooth or mouth problems that make it hard for me to eat 0 No I don't always have enough money to buy the food I need 0 No I eat alone most of the time 0 No I take three or more different prescribed or over-the-counter drugs a day 0 No Without wanting to, I have lost or gained 10 pounds in the last six months 2 Yes I am not always physically able to shop, cook and/or feed myself 0 No Nutrition Protocols Good Risk Protocol Moderate Risk Protocol High Risk Proctocol 0 Provide education on nutrition Risk Level: High Risk Score: 7 Electronic Signature(s) Signed: 03/11/2022 10:35:58 AM By: Erenest Blank Signed: 03/11/2022 3:54:54 PM By: Deon Pilling RN, BSN Entered By: Erenest Blank on 03/11/2022 09:11:24

## 2022-03-25 ENCOUNTER — Other Ambulatory Visit: Payer: Self-pay | Admitting: Family Medicine

## 2022-03-25 DIAGNOSIS — R221 Localized swelling, mass and lump, neck: Secondary | ICD-10-CM

## 2022-03-29 ENCOUNTER — Telehealth: Payer: Self-pay | Admitting: Physician Assistant

## 2022-03-29 NOTE — Telephone Encounter (Signed)
Scheduled appt per 9/5 referral. Pt is aware of appt date and time. Pt is aware to arrive 15 mins prior to appt time and to bring and updated insurance card. Pt is aware of appt location.   Also mailed updated calendar per pt request.

## 2022-03-30 ENCOUNTER — Ambulatory Visit (INDEPENDENT_AMBULATORY_CARE_PROVIDER_SITE_OTHER): Payer: Medicare Other | Admitting: Podiatry

## 2022-03-30 ENCOUNTER — Other Ambulatory Visit: Payer: Self-pay | Admitting: Family Medicine

## 2022-03-30 ENCOUNTER — Encounter: Payer: Self-pay | Admitting: Podiatry

## 2022-03-30 DIAGNOSIS — B351 Tinea unguium: Secondary | ICD-10-CM

## 2022-03-30 DIAGNOSIS — F32A Depression, unspecified: Secondary | ICD-10-CM

## 2022-03-30 DIAGNOSIS — I10 Essential (primary) hypertension: Secondary | ICD-10-CM

## 2022-03-30 DIAGNOSIS — M79674 Pain in right toe(s): Secondary | ICD-10-CM | POA: Diagnosis not present

## 2022-03-30 DIAGNOSIS — M79675 Pain in left toe(s): Secondary | ICD-10-CM

## 2022-03-30 NOTE — Progress Notes (Signed)
  Subjective:  Patient ID: [name], male    DOB: September 10, 1950,   MRN: [id number]  Chief Complaint  Patient presents with   Tinea Pedis     bil toenail fungus    71 y.o. male presents for concern of thickened elongated and painful nails that are difficult to trim. Requesting to have them trimmed today. He is not diabetic and not on blood thinners.   PCP:  Arthur Holms, NP    . Denies any other pedal complaints. Denies n/v/f/c.   Past Medical History:  Diagnosis Date   Arthritis    [name] ulcer, chronic 01/11/2018   Chronic back pain    GERD (gastroesophageal reflux disease)    uses Baking Soda   GIB (gastrointestinal bleeding) 2012   Glaucoma    right eye   Headache    occasionally   Hepatitis C    Hiatal hernia with gastroesophageal reflux disease and esophagitis 01/05/2018   History of blood transfusion    no abnormal reaction noted   History of gout    doesn't take any meds   Hyperlipidemia    not on any meds   Insomnia    takes Ambien nightly   Ischemic colitis ([place]) 2012   Joint pain    Nocturia    Numbness    both legs occasionally   PONV (postoperative nausea and vomiting)    Prostate cancer (HCC)    Shortness of breath dyspnea    rarely but when notices he can be lying/sitting/exertion.Dr.Hochrein is aware per pt   Stroke [facility]) 2016    Urinary frequency    Urinary urgency     Objective:  Physical Exam: Vascular: DP/PT pulses 2/4 bilateral. CFT <3 seconds. Absent hair growth on digits. Edema noted to bilateral lower extremities. Xerosis noted bilaterally.  Skin. No lacerations or abrasions bilateral feet. Nails 1-5 bilateral  are thickened discolored and elongated with subungual debris.  Musculoskeletal: MMT 5/5 bilateral lower extremities in DF, PF, Inversion and Eversion. Deceased ROM in DF of ankle joint.  Neurological: Sensation intact to light touch. Protective sensation intact bilateral.    Assessment:  No diagnosis found.   Plan:   Patient was evaluated and treated and all questions answered. -Discussed and educated patient on foot care, especially with  regards to the vascular, neurological and musculoskeletal systems.  -Discussed supportive shoes at all times and checking feet regularly.  -Mechanically debrided all nails 1-5 bilateral using sterile nail nipper and filed with dremel without incident  -Answered all patient questions -Patient to return  in 3 months for at risk foot care -Patient advised to call the office if any problems or questions arise in the meantime.   Lorenda Peck, DPM

## 2022-03-31 ENCOUNTER — Other Ambulatory Visit: Payer: Self-pay | Admitting: Family Medicine

## 2022-03-31 ENCOUNTER — Ambulatory Visit
Admission: RE | Admit: 2022-03-31 | Discharge: 2022-03-31 | Disposition: A | Discharge status: Home / Self Care | Payer: Medicare Other | Source: Ambulatory Visit | Attending: Family Medicine | Admitting: Family Medicine

## 2022-03-31 DIAGNOSIS — R600 Localized edema: Secondary | ICD-10-CM

## 2022-03-31 DIAGNOSIS — R221 Localized swelling, mass and lump, neck: Secondary | ICD-10-CM

## 2022-03-31 DIAGNOSIS — I10 Essential (primary) hypertension: Secondary | ICD-10-CM

## 2022-04-09 ENCOUNTER — Ambulatory Visit: Payer: Self-pay

## 2022-04-09 NOTE — Patient Outreach (Signed)
  Care Coordination   04/09/2022 Name: [name] MRN: [id number] DOB: 1951/05/27   Care Coordination Outreach Attempts:  A second unsuccessful outreach was attempted today to offer the patient with information about available care coordination services as a benefit of their health plan.     Follow Up Plan:  Additional outreach attempts will be made to offer the patient care coordination information and services.   Encounter Outcome:  No Answer  Care Coordination Interventions Activated:  No   Care Coordination Interventions:  No, not indicated    Daneen Schick, BSW, CDP Social Worker, Certified Dementia Practitioner [facility]  Care Coordination [phone]

## 2022-04-16 ENCOUNTER — Other Ambulatory Visit: Payer: Self-pay

## 2022-04-16 DIAGNOSIS — D509 Iron deficiency anemia, unspecified: Secondary | ICD-10-CM

## 2022-04-18 NOTE — Progress Notes (Deleted)
[place] [phone]   [phone]  CONSULT NOTE  REFERRING PHYSICIAN: Dr. Alfonse [name]   REASON FOR CONSULTATION:  Iron Deficiency Anemia  HPI [name] is a 71 y.o. male with multiple medical problems such as history of stroke, hypertension, small bowel AVMs, hepatitis C, tobacco abuse, alcohol abuse, major depressive disorder, GERD, ***is referred to clinic for iron deficiency anemia.  The patient recently had a appointment with his PCP to follow-up on his chronic medical conditions such as hypertension, major depressive disorder, and osteoarthritis 9/1/thousand 23.  He had repeat blood work performed around that time which showed microcytic anemia with a hemoglobin low at 8.6, low MCV at 70, elevated RDW at 27.3, reactive thrombocytosis with a platelet count of 419 K, low ferritin at 3, low iron saturation 5%, and low iron at 19.  The patient's folate was within normal limits.  His vitamin B12 was within normal limits.  He was referred to the clinic for further evaluation and management regarding this.  The patient has a history of small bowel AVMs for which she was seen by Dr. Fuller [name].  Endoscopy 12/26/21 revealed A single non-bleeding angiodysplastic lesion in the duodenum. Treated with argon plasma coagulation (APC). - Large hiatal hernia. - No specimens collected.His last colonoscopy 10/26/15 by Dr. Loletha [name] Internal hemorrhoids. and endoscopy was on ***.  The patient has ongoing concerns related to reflux, nausea, vomiting for which he is taking Protonix.  Follow-up with GI?  Long hx ida at least 2010 ferritin 2. Possible small bowel?     He is only been on this for a  *** years? Prescription or OTC? Dose? Compliant?. Ever need IV or blood? He was referred to the clinic for further evaluation and consideration of IV iron infusions.  How long? How long on iron? Ever need blood transfusion? Iron infusion [name] last given 12/25/20. Can see a few  doses. But usually only gets one dose at a time???     The patient reports that he is feeling ***.   The patient denies any odynophagia or dysphagia.  He still intermittently has spells of lightheadedness.  He reports more cold intolerance .   The patient patient denies any particular dietary habits such as being a vegan or vegetarian.  He does eat a fair amount of red meat ***   Denies any chronic kidney disease.  Denies any history of any bariatric surgery. Blood thiiner. ???   The oldest records available to me are from 2010. It appears he has had  anemia since that time. The lowest hbg is 3.8****from PCP's office. Last year in June 2022. What was going on ????. although there have been several incidents of sever anemia with Hbg in 6's. SHe reports he needed a blood transfusion in the past when s***  He received a iron infusion **** on 8*** with ***.     Regarding symptoms of her anemia, he reports fatigue.  H***e has occasional lightheadedness, occasional dyspnea on exertion, and reports occasional sensation of palpitations.  He denies gingival bleeding, hemoptysis, hematemesis, melena, epistaxis, or hematochezia.  Bowel habits.  She denies any fever or night sweats.  He reports some cold intolerance which may be secondary to *** anemia.  She denies any lymphadenopathy.  He denies any NSAID use.  CKD??? She denies any particular dietary habits such as being in a vegan or vegetarian. He does ***crave ice chips.  Denies any history of bariatric surgery.    HPI  Past Medical History:  Diagnosis Date   Arthritis    [name] ulcer, chronic 01/11/2018   Chronic back pain    GERD (gastroesophageal reflux disease)    uses Baking Soda   GIB (gastrointestinal bleeding) 2012   Glaucoma    right eye   Headache    occasionally   Hepatitis C    Hiatal hernia with gastroesophageal reflux disease and esophagitis 01/05/2018   History of blood transfusion    no abnormal reaction noted   History of gout     doesn't take any meds   Hyperlipidemia    not on any meds   Insomnia    takes Ambien nightly   Ischemic colitis ([place]) 2012   Joint pain    Nocturia    Numbness    both legs occasionally   PONV (postoperative nausea and vomiting)    Prostate cancer (HCC)    Shortness of breath dyspnea    rarely but when notices he can be lying/sitting/exertion.JonathonHochrein is aware per pt   Stroke [facility]) 2016    Urinary frequency    Urinary urgency     Past Surgical History:  Procedure Laterality Date   APPENDECTOMY     BIOPSY  01/11/2018   Procedure: BIOPSY;  Surgeon: Jonathon Mayer, MD;  Location: [facility] ENDOSCOPY;  Service: Endoscopy;;   COLONOSCOPY     COLONOSCOPY N/A 10/26/2015   Procedure: COLONOSCOPY;  Surgeon: Jonathon Stabler, MD;  Location: [place];  Service: Endoscopy;  Laterality: N/A;   ESOPHAGOGASTRODUODENOSCOPY     ESOPHAGOGASTRODUODENOSCOPY N/A 10/26/2015   Procedure: ESOPHAGOGASTRODUODENOSCOPY (EGD);  Surgeon: Jonathon Stabler, MD;  Location: [facility] ENDOSCOPY;  Service: Endoscopy;  Laterality: N/A;   ESOPHAGOGASTRODUODENOSCOPY (EGD) WITH PROPOFOL N/A 01/11/2018   Procedure: ESOPHAGOGASTRODUODENOSCOPY (EGD) WITH PROPOFOL;  Surgeon: Jonathon Mayer, MD;  Location: [facility] ENDOSCOPY;  Service: Endoscopy;  Laterality: N/A;   ESOPHAGOGASTRODUODENOSCOPY (EGD) WITH PROPOFOL N/A 12/26/2020   Procedure: ESOPHAGOGASTRODUODENOSCOPY (EGD) WITH PROPOFOL;  Surgeon: Jonathon Artist, MD;  Location: [facility] ENDOSCOPY;  Service: Endoscopy;  Laterality: N/A;   HOT HEMOSTASIS N/A 12/26/2020   Procedure: HOT HEMOSTASIS (ARGON PLASMA COAGULATION/BICAP);  Surgeon: Jonathon Artist, MD;  Location: [facility] ENDOSCOPY;  Service: Endoscopy;  Laterality: N/A;   INGUINAL HERNIA REPAIR Right 11/04/2014   Procedure: RIGHT INGUINAL HERNIA REPAIR WITH MESH;  Surgeon: Jonathon Confer, MD;  Location: [place];  Service: General;  Laterality: Right;   MASS EXCISION N/A 11/04/2014   Procedure: REMOVAL OF RIGHT GROIN SOFT TISSUE MASS;   Surgeon: Jonathon Confer, MD;  Location: [place];  Service: General;  Laterality: N/A;   Multiple abdominal surgeries     total of 13   PROSTATECTOMY     SMALL INTESTINE SURGERY      Family History  Problem Relation Age of Onset   Alzheimer's disease Father    Cancer Mother        Lymph node   Heart failure Brother 18       Transplant 36 years ago   CAD Brother    Heart disease Sister 38       MI    Social History Social History   Tobacco Use   Smoking status: Every Day    Packs/day: 0.50    Years: 50.00    Total pack years: 25.00    Types: Cigarettes   Smokeless tobacco: Never  Vaping Use   Vaping Use: Former  Substance Use Topics   Alcohol use: Yes    Alcohol/week: 0.0  standard drinks of alcohol    Comment: occasionally   Drug use: Not Currently    Frequency: 5.0 times per week    Types: Marijuana    Comment: "as often as I can get it"    Allergies  Allergen Reactions   Penicillins Anaphylaxis    Has patient had a PCN reaction causing immediate rash, facial/tongue/throat swelling, SOB or lightheadedness with hypotension: Yes Has patient had a PCN reaction causing severe rash involving mucus membranes or skin necrosis: No Has patient had a PCN reaction that required hospitalization: No Has patient had a PCN reaction occurring within the last 10 years: No If all of the above answers are "NO", then may proceed with Cephalosporin use.[name] [Pseudoephedrine] Palpitations and Other (See Comments)    Heart "races"    Current Outpatient Medications  Medication Sig Dispense Refill   acetaminophen (TYLENOL) 325 MG tablet Take 2 tablets (650 mg total) by mouth every 6 (six) hours as needed for moderate pain or headache.     buPROPion (WELLBUTRIN SR) 200 MG 12 hr tablet TAKE 1 TABLET BY MOUTH DAILY 28 tablet 0   cyclobenzaprine (FLEXERIL) 5 MG tablet TAKE 1 TABLET BY MOUTH TWICE A DAY AS NEEDED FOR MUSCLE SPASMS 60 tablet 2   FEROSUL 325 (65 Fe) MG tablet TAKE 1  TABLET BY MOUTH THREE TIMES A DAY WITH MEALS 84 tablet 0   furosemide (LASIX) 20 MG tablet Take 1 tablet (20 mg total) by mouth 2 (two) times daily. 60 tablet 3   gabapentin (NEURONTIN) 300 MG capsule Take by mouth.     hydrALAZINE (APRESOLINE) 25 MG tablet TAKE 1 TABLET BY MOUTH THREE TIMES A DAY 84 tablet 0   HYDROcodone-acetaminophen (NORCO/VICODIN) 5-325 MG tablet Take 2 tablets by mouth every 6 (six) hours as needed. 30 tablet 0   ibuprofen (ADVIL) 600 MG tablet Take by mouth.     lisinopril (ZESTRIL) 40 MG tablet Take 1 tablet by mouth daily.     magnesium oxide (MAG-OX) 400 MG tablet Take 400 mg by mouth daily.     Multiple Vitamin (MULTIVITAMINS PO) Take 1 tablet by mouth daily.     mupirocin ointment (BACTROBAN) 2 % APPLY THIN LAYER TO AFFECTED AREA AS DIRECTED BY YOUR MEDICAL PROVIDER APPLY TO CLEAN DRY SKIN     pantoprazole (PROTONIX) 40 MG tablet TAKE 1 TABLET BY MOUTH TWICE A DAY BEFORE MEALS 56 tablet 2   potassium chloride SA (KLOR-CON M) 20 MEQ tablet TAKE 1 TABLET BY MOUTH DAILY 28 tablet 2   predniSONE (DELTASONE) 10 MG tablet Take 5 tabs on day 1, 4 tabs on day 2, 3 tabs on day 3, 2 tabs on day 4, 1 tab on day 5. 15 tablet 0   QUEtiapine (SEROQUEL) 200 MG tablet Take 1 tablet (200 mg total) by mouth at bedtime. 30 tablet 0   senna-docusate (SENOKOT-S) 8.6-50 MG tablet Take 1 tablet by mouth 2 (two) times daily. 60 tablet 0   sertraline (ZOLOFT) 100 MG tablet Take 1 tablet (100 mg total) by mouth daily. 30 tablet 6   silver sulfADIAZINE (SILVADENE) 1 % cream Apply 1 application topically daily. 50 g 1   sodium chloride (OCEAN) 0.65 % SOLN nasal spray Place 1 spray into both nostrils as needed for congestion.  0   spironolactone (ALDACTONE) 25 MG tablet Take 1 tablet (25 mg total) by mouth daily. 90 tablet 3   thiamine 100 MG tablet Take by mouth.  No current facility-administered medications for this visit.    REVIEW OF SYSTEMS:   Review of Systems  Constitutional:  Negative for appetite change, chills, fatigue, fever and unexpected weight change.  HENT:   Negative for mouth sores, nosebleeds, sore throat and trouble swallowing.   Eyes: Negative for eye problems and icterus.  Respiratory: Negative for cough, hemoptysis, shortness of breath and wheezing.   Cardiovascular: Negative for chest pain and leg swelling.  Gastrointestinal: Negative for abdominal pain, constipation, diarrhea, nausea and vomiting.  Genitourinary: Negative for bladder incontinence, difficulty urinating, dysuria, frequency and hematuria.   Musculoskeletal: Negative for back pain, gait problem, neck pain and neck stiffness.  Skin: Negative for itching and rash.  Neurological: Negative for dizziness, extremity weakness, gait problem, headaches, light-headedness and seizures.  Hematological: Negative for adenopathy. Does not bruise/bleed easily.  Psychiatric/Behavioral: Negative for confusion, depression and sleep disturbance. The patient is not nervous/anxious.     PHYSICAL EXAMINATION:  There were no vitals taken for this visit.  ECOG PERFORMANCE STATUS: {CHL ONC ECOG [id number]  Physical Exam  Constitutional: Oriented to person, place, and time and well-developed, well-nourished, and in no distress. No distress.  HENT:  Head: Normocephalic and atraumatic.  Mouth/Throat: Oropharynx is clear and moist. No oropharyngeal exudate.  Eyes: Conjunctivae are normal. Right eye exhibits no discharge. Left eye exhibits no discharge. No scleral icterus.  Neck: Normal range of motion. Neck supple.  Cardiovascular: Normal rate, regular rhythm, normal heart sounds and intact distal pulses.   Pulmonary/Chest: Effort normal and breath sounds normal. No respiratory distress. No wheezes. No rales.  Abdominal: Soft. Bowel sounds are normal. Exhibits no distension and no mass. There is no tenderness.  Musculoskeletal: Normal range of motion. Exhibits no edema.  Lymphadenopathy:    No cervical  adenopathy.  Neurological: Alert and oriented to person, place, and time. Exhibits normal muscle tone. Gait normal. Coordination normal.  Skin: Skin is warm and dry. No rash noted. Not diaphoretic. No erythema. No pallor.  Psychiatric: Mood, memory and judgment normal.  Vitals reviewed.  LABORATORY DATA: Lab Results  Component Value Date   WBC 5.3 09/28/2021   HGB 8.1 Repeated and verified X2. (L) 09/28/2021   HCT 28.0 Repeated and verified X2. (L) 09/28/2021   MCV 65.1 Repeated and verified X2. (L) 09/28/2021   PLT 501.0 (H) 09/28/2021      Chemistry      Component Value Date/Time   NA 141 09/28/2021 1400   NA 139 09/22/2020 0000   K 4.1 09/28/2021 1400   CL 105 09/28/2021 1400   CO2 27 09/28/2021 1400   BUN 10 09/28/2021 1400   BUN 14 06/23/2017 1413   CREATININE 0.92 09/28/2021 1400   CREATININE 1.18 03/31/2020 1441      Component Value Date/Time   CALCIUM 9.5 09/28/2021 1400   ALKPHOS 112 09/28/2021 1400   AST 10 09/28/2021 1400   ALT 7 09/28/2021 1400   ALT 16 05/12/2016 1550   BILITOT 0.3 09/28/2021 1400       RADIOGRAPHIC STUDIES: US SOFT TISSUE HEAD & NECK (NON-THYROID)  Result Date: 03/31/2022 CLINICAL DATA:  Left anterior neck mass EXAM: ULTRASOUND OF HEAD/NECK SOFT TISSUES TECHNIQUE: Ultrasound examination of the head and neck soft tissues was performed in the area of clinical concern. COMPARISON:  Cervical spine CT 02/12/2020 FINDINGS: Targeted ultrasound of the symptomatic left submandibular region shows normal sized lymph nodes, subcutaneous fat, and salivary tissue. No mass or visible inflammation. IMPRESSION: Negative neck ultrasound. No mass or adenopathy  seen in the symptomatic left neck. Electronically Signed   By: Jorje Guild M.D.   On: 03/31/2022 21:11    ASSESSMENT: This is a very pleasant 71 year old African American male referred to clinic for iron deficiency anemia.  Patient was seen with Dr. Julien Nordmann today.  The patient had repeat CBC, CMP,  sample blood bank, iron studies, ferritin, folate, B12, and SPEP drawn today.  The patient's labs today show microcytic anemia with a hemoglobin of ***. His iron studies continue to show significant iron deficiency. His other lab studies are pending.    Dr. Julien Nordmann recommends that we arrange for IV iron infusions with Venofer 300 mg x 3 we will arrange for him to receive his infusion at the [facility] infusion center.   He was instructed to continue taking his over-the-counter iron supplement.  He was encouraged to continue to take this with vitamin C as that helps with iron absorption.  We did discuss constipation education with iron supplements.   He was instructed to follow up with GI given his anemia, hx AVMs, GERD, nausea/vomiting. His PCP recently started him on phengergan.   The patient also will continue to follow closely with GI.  He is expected to have a repeat colonoscopy in ***  He is aware to refrain from using any NSAIDs.  He will continue taking a PPI.   The patient voices understanding of current disease status and treatment options and is in agreement with the current care [name].  All questions were answered. The patient knows to call the clinic with any problems, questions or concerns. We can certainly see the patient much sooner if necessary.  Thank you so much for allowing me to participate in the care of [name]. I will continue to follow up the patient with you and assist in his care.  I spent {CHL ONC TIME VISIT - RJJOA:[id number]} counseling the patient face to face. The total time spent in the appointment was {CHL ONC TIME VISIT - WFUXN:[id number]}.  Disclaimer: This note was dictated with voice recognition software. Similar sounding words can inadvertently be transcribed and may not be corrected upon review.   Kately Graffam L Samaiyah Howes April 18, 2022, 11:07 AM

## 2022-04-19 ENCOUNTER — Inpatient Hospital Stay: Payer: Medicaid Other

## 2022-04-19 ENCOUNTER — Inpatient Hospital Stay: Payer: Medicaid Other | Admitting: Physician Assistant

## 2022-04-19 ENCOUNTER — Other Ambulatory Visit: Payer: Self-pay | Admitting: Physician Assistant

## 2022-04-19 DIAGNOSIS — D649 Anemia, unspecified: Secondary | ICD-10-CM

## 2022-04-19 DIAGNOSIS — D509 Iron deficiency anemia, unspecified: Secondary | ICD-10-CM

## 2022-04-28 NOTE — Progress Notes (Deleted)
[place] [phone]   [phone]  CONSULT NOTE  REFERRING PHYSICIAN: Dr. Alfonse Spruce   REASON FOR CONSULTATION:  Iron Deficiency Anemia  HPI [name] is a 71 y.o. male with multiple medical problems such as history of stroke, hypertension, small bowel AVMs, hepatitis C, tobacco abuse, alcohol abuse, major depressive disorder, GERD, ***is referred to clinic for iron deficiency anemia.  The patient recently had a appointment with his PCP to follow-up on his chronic medical conditions such as hypertension, major depressive disorder, and osteoarthritis 9/1/thousand 23.  He had repeat blood work performed around that time which showed microcytic anemia with a hemoglobin low at 8.6, low MCV at 70, elevated RDW at 27.3, reactive thrombocytosis with a platelet count of 419 K, low ferritin at 3, low iron saturation 5%, and low iron at 19.  The patient's folate was within normal limits.  His vitamin B12 was within normal limits.  He was referred to the clinic for further evaluation and management regarding this.  The patient has a history of small bowel AVMs for which she was seen by Dr. Fuller Plan.  Endoscopy 12/26/21 revealed A single non-bleeding angiodysplastic lesion in the duodenum. Treated with argon plasma coagulation (APC). - Large hiatal hernia. - No specimens collected.His last colonoscopy 10/26/15 by Dr. Loletha Carrow Internal hemorrhoids. and endoscopy was on ***.  The patient has ongoing concerns related to reflux, nausea, vomiting for which he is taking Protonix.  Follow-up with GI?  Long hx ida at least 2010 ferritin 2. Possible small bowel?     He is only been on this for a  *** years? Prescription or OTC? Dose? Compliant?. Ever need IV or blood? He was referred to the clinic for further evaluation and consideration of IV iron infusions.  How long? How long on iron? Ever need blood transfusion? Iron infusion Ferahame last given 12/25/20. Can see a few  doses. But usually only gets one dose at a time???     The patient reports that he is feeling ***.   The patient denies any odynophagia or dysphagia.  He still intermittently has spells of lightheadedness.  He reports more cold intolerance .   The patient patient denies any particular dietary habits such as being a vegan or vegetarian.  He does eat a fair amount of red meat ***   Denies any chronic kidney disease.  Denies any history of any bariatric surgery. Blood thiiner. ???   The oldest records available to me are from 2010. It appears he has had  anemia since that time. The lowest hbg is 3.8****from PCP's office. Last year in June 2022. What was going on ????. although there have been several incidents of sever anemia with Hbg in 6's. SHe reports he needed a blood transfusion in the past when s***  He received a iron infusion **** on 8*** with ***.     Regarding symptoms of her anemia, he reports fatigue.  H***e has occasional lightheadedness, occasional dyspnea on exertion, and reports occasional sensation of palpitations.  He denies gingival bleeding, hemoptysis, hematemesis, melena, epistaxis, or hematochezia.  Bowel habits.  She denies any fever or night sweats.  He reports some cold intolerance which may be secondary to *** anemia.  She denies any lymphadenopathy.  He denies any NSAID use.  CKD??? She denies any particular dietary habits such as being in a vegan or vegetarian. He does ***crave ice chips.  Denies any history of bariatric surgery.    HPI  Past Medical History:  Diagnosis Date   Arthritis    [name] ulcer, chronic 01/11/2018   Chronic back pain    GERD (gastroesophageal reflux disease)    uses Baking Soda   GIB (gastrointestinal bleeding) 2012   Glaucoma    right eye   Headache    occasionally   Hepatitis C    Hiatal hernia with gastroesophageal reflux disease and esophagitis 01/05/2018   History of blood transfusion    no abnormal reaction noted   History of gout     doesn't take any meds   Hyperlipidemia    not on any meds   Insomnia    takes Ambien nightly   Ischemic colitis ([place]) 2012   Joint pain    Nocturia    Numbness    both legs occasionally   PONV (postoperative nausea and vomiting)    Prostate cancer (HCC)    Shortness of breath dyspnea    rarely but when notices he can be lying/sitting/exertion.Dr.Hochrein is aware per pt   Stroke [facility]) 2016    Urinary frequency    Urinary urgency     Past Surgical History:  Procedure Laterality Date   APPENDECTOMY     BIOPSY  01/11/2018   Procedure: BIOPSY;  Surgeon: Gatha Mayer, MD;  Location: [facility] ENDOSCOPY;  Service: Endoscopy;;   COLONOSCOPY     COLONOSCOPY N/A 10/26/2015   Procedure: COLONOSCOPY;  Surgeon: Doran Stabler, MD;  Location: [facility];  Service: Endoscopy;  Laterality: N/A;   ESOPHAGOGASTRODUODENOSCOPY     ESOPHAGOGASTRODUODENOSCOPY N/A 10/26/2015   Procedure: ESOPHAGOGASTRODUODENOSCOPY (EGD);  Surgeon: Doran Stabler, MD;  Location: [facility] ENDOSCOPY;  Service: Endoscopy;  Laterality: N/A;   ESOPHAGOGASTRODUODENOSCOPY (EGD) WITH PROPOFOL N/A 01/11/2018   Procedure: ESOPHAGOGASTRODUODENOSCOPY (EGD) WITH PROPOFOL;  Surgeon: Gatha Mayer, MD;  Location: [facility] ENDOSCOPY;  Service: Endoscopy;  Laterality: N/A;   ESOPHAGOGASTRODUODENOSCOPY (EGD) WITH PROPOFOL N/A 12/26/2020   Procedure: ESOPHAGOGASTRODUODENOSCOPY (EGD) WITH PROPOFOL;  Surgeon: Ladene Artist, MD;  [facility] ENDOSCOPY;  Service: Endoscopy;  Laterality: N/A;   HOT HEMOSTASIS N/A 12/26/2020   Procedure: HOT HEMOSTASIS (ARGON PLASMA COAGULATION/BICAP);  Surgeon: Ladene Artist, MD;  Location: [facility] ENDOSCOPY;  Service: Endoscopy;  Laterality: N/A;   INGUINAL HERNIA REPAIR Right 11/04/2014   Procedure: RIGHT INGUINAL HERNIA REPAIR WITH MESH;  Surgeon: Jackolyn Confer, MD;  Location: [place];  Service: General;  Laterality: Right;   MASS EXCISION N/A 11/04/2014   Procedure: REMOVAL OF RIGHT GROIN SOFT TISSUE MASS;   Surgeon: Jackolyn Confer, MD;  Location: [place];  Service: General;  Laterality: N/A;   Multiple abdominal surgeries     total of 13   PROSTATECTOMY     SMALL INTESTINE SURGERY      Family History  Problem Relation Age of Onset   Alzheimer's disease Father    Cancer Mother        Lymph node   Heart failure Brother 42       Transplant 33 years ago   CAD Brother    Heart disease Sister 39       MI    Social History Social History   Tobacco Use   Smoking status: Every Day    Packs/day: 0.50    Years: 50.00    Total pack years: 25.00    Types: Cigarettes   Smokeless tobacco: Never  Vaping Use   Vaping Use: Former  Substance Use Topics   Alcohol use: Yes    Alcohol/week: 0.0  standard drinks of alcohol    Comment: occasionally   Drug use: Not Currently    Frequency: 5.0 times per week    Types: Marijuana    Comment: "as often as I can get it"    Allergies  Allergen Reactions   Penicillins Anaphylaxis    Has patient had a PCN reaction causing immediate rash, facial/tongue/throat swelling, SOB or lightheadedness with hypotension: Yes Has patient had a PCN reaction causing severe rash involving mucus membranes or skin necrosis: No Has patient had a PCN reaction that required hospitalization: No Has patient had a PCN reaction occurring within the last 10 years: No If all of the above answers are "NO", then may proceed with Cephalosporin use.[name] [Pseudoephedrine] Palpitations and Other (See Comments)    Heart "races"    Current Outpatient Medications  Medication Sig Dispense Refill   acetaminophen (TYLENOL) 325 MG tablet Take 2 tablets (650 mg total) by mouth every 6 (six) hours as needed for moderate pain or headache.     buPROPion (WELLBUTRIN SR) 200 MG 12 hr tablet TAKE 1 TABLET BY MOUTH DAILY 28 tablet 0   cyclobenzaprine (FLEXERIL) 5 MG tablet TAKE 1 TABLET BY MOUTH TWICE A DAY AS NEEDED FOR MUSCLE SPASMS 60 tablet 2   FEROSUL 325 (65 Fe) MG tablet TAKE 1  TABLET BY MOUTH THREE TIMES A DAY WITH MEALS 84 tablet 0   furosemide (LASIX) 20 MG tablet Take 1 tablet (20 mg total) by mouth 2 (two) times daily. 60 tablet 3   gabapentin (NEURONTIN) 300 MG capsule Take by mouth.     hydrALAZINE (APRESOLINE) 25 MG tablet TAKE 1 TABLET BY MOUTH THREE TIMES A DAY 84 tablet 0   HYDROcodone-acetaminophen (NORCO/VICODIN) 5-325 MG tablet Take 2 tablets by mouth every 6 (six) hours as needed. 30 tablet 0   ibuprofen (ADVIL) 600 MG tablet Take by mouth.     lisinopril (ZESTRIL) 40 MG tablet Take 1 tablet by mouth daily.     magnesium oxide (MAG-OX) 400 MG tablet Take 400 mg by mouth daily.     Multiple Vitamin (MULTIVITAMINS PO) Take 1 tablet by mouth daily.     mupirocin ointment (BACTROBAN) 2 % APPLY THIN LAYER TO AFFECTED AREA AS DIRECTED BY YOUR MEDICAL PROVIDER APPLY TO CLEAN DRY SKIN     pantoprazole (PROTONIX) 40 MG tablet TAKE 1 TABLET BY MOUTH TWICE A DAY BEFORE MEALS 56 tablet 2   potassium chloride SA (KLOR-CON M) 20 MEQ tablet TAKE 1 TABLET BY MOUTH DAILY 28 tablet 2   predniSONE (DELTASONE) 10 MG tablet Take 5 tabs on day 1, 4 tabs on day 2, 3 tabs on day 3, 2 tabs on day 4, 1 tab on day 5. 15 tablet 0   QUEtiapine (SEROQUEL) 200 MG tablet Take 1 tablet (200 mg total) by mouth at bedtime. 30 tablet 0   senna-docusate (SENOKOT-S) 8.6-50 MG tablet Take 1 tablet by mouth 2 (two) times daily. 60 tablet 0   sertraline (ZOLOFT) 100 MG tablet Take 1 tablet (100 mg total) by mouth daily. 30 tablet 6   silver sulfADIAZINE (SILVADENE) 1 % cream Apply 1 application topically daily. 50 g 1   sodium chloride (OCEAN) 0.65 % SOLN nasal spray Place 1 spray into both nostrils as needed for congestion.  0   spironolactone (ALDACTONE) 25 MG tablet Take 1 tablet (25 mg total) by mouth daily. 90 tablet 3   thiamine 100 MG tablet Take by mouth.  No current facility-administered medications for this visit.    REVIEW OF SYSTEMS:   Review of Systems  Constitutional:  Negative for appetite change, chills, fatigue, fever and unexpected weight change.  HENT:   Negative for mouth sores, nosebleeds, sore throat and trouble swallowing.   Eyes: Negative for eye problems and icterus.  Respiratory: Negative for cough, hemoptysis, shortness of breath and wheezing.   Cardiovascular: Negative for chest pain and leg swelling.  Gastrointestinal: Negative for abdominal pain, constipation, diarrhea, nausea and vomiting.  Genitourinary: Negative for bladder incontinence, difficulty urinating, dysuria, frequency and hematuria.   Musculoskeletal: Negative for back pain, gait problem, neck pain and neck stiffness.  Skin: Negative for itching and rash.  Neurological: Negative for dizziness, extremity weakness, gait problem, headaches, light-headedness and seizures.  Hematological: Negative for adenopathy. Does not bruise/bleed easily.  Psychiatric/Behavioral: Negative for confusion, depression and sleep disturbance. The patient is not nervous/anxious.     PHYSICAL EXAMINATION:  There were no vitals taken for this visit.  ECOG PERFORMANCE STATUS: {CHL ONC ECOG [id number]  Physical Exam  Constitutional: Oriented to person, place, and time and well-developed, well-nourished, and in no distress. No distress.  HENT:  Head: Normocephalic and atraumatic.  Mouth/Throat: Oropharynx is clear and moist. No oropharyngeal exudate.  Eyes: Conjunctivae are normal. Right eye exhibits no discharge. Left eye exhibits no discharge. No scleral icterus.  Neck: Normal range of motion. Neck supple.  Cardiovascular: Normal rate, regular rhythm, normal heart sounds and intact distal pulses.   Pulmonary/Chest: Effort normal and breath sounds normal. No respiratory distress. No wheezes. No rales.  Abdominal: Soft. Bowel sounds are normal. Exhibits no distension and no mass. There is no tenderness.  Musculoskeletal: Normal range of motion. Exhibits no edema.  Lymphadenopathy:    No cervical  adenopathy.  Neurological: Alert and oriented to person, place, and time. Exhibits normal muscle tone. Gait normal. Coordination normal.  Skin: Skin is warm and dry. No rash noted. Not diaphoretic. No erythema. No pallor.  Psychiatric: Mood, memory and judgment normal.  Vitals reviewed.  LABORATORY DATA: Lab Results  Component Value Date   WBC 5.3 09/28/2021   HGB 8.1 Repeated and verified X2. (L) 09/28/2021   HCT 28.0 Repeated and verified X2. (L) 09/28/2021   MCV 65.1 Repeated and verified X2. (L) 09/28/2021   PLT 501.0 (H) 09/28/2021      Chemistry      Component Value Date/Time   NA 141 09/28/2021 1400   NA 139 09/22/2020 0000   K 4.1 09/28/2021 1400   CL 105 09/28/2021 1400   CO2 27 09/28/2021 1400   BUN 10 09/28/2021 1400   BUN 14 06/23/2017 1413   CREATININE 0.92 09/28/2021 1400   CREATININE 1.18 03/31/2020 1441      Component Value Date/Time   CALCIUM 9.5 09/28/2021 1400   ALKPHOS 112 09/28/2021 1400   AST 10 09/28/2021 1400   ALT 7 09/28/2021 1400   ALT 16 05/12/2016 1550   BILITOT 0.3 09/28/2021 1400       RADIOGRAPHIC STUDIES: US SOFT TISSUE HEAD & NECK (NON-THYROID)  Result Date: 03/31/2022 CLINICAL DATA:  Left anterior neck mass EXAM: ULTRASOUND OF HEAD/NECK SOFT TISSUES TECHNIQUE: Ultrasound examination of the head and neck soft tissues was performed in the area of clinical concern. COMPARISON:  Cervical spine CT 02/12/2020 FINDINGS: Targeted ultrasound of the symptomatic left submandibular region shows normal sized lymph nodes, subcutaneous fat, and salivary tissue. No mass or visible inflammation. IMPRESSION: Negative neck ultrasound. No mass or adenopathy  seen in the symptomatic left neck. Electronically Signed   By: Jorje Guild M.D.   On: 03/31/2022 21:11    ASSESSMENT: This is a very pleasant 71 year old African American male referred to clinic for iron deficiency anemia.  Patient was seen with Dr. Julien Nordmann today.  The patient had repeat CBC, CMP,  sample blood bank, iron studies, ferritin, folate, B12, and SPEP drawn today.  The patient's labs today show microcytic anemia with a hemoglobin of ***. His iron studies continue to show significant iron deficiency. His other lab studies are pending.    Dr. Julien Nordmann recommends that we arrange for IV iron infusions with Venofer 300 mg x 3 we will arrange for him to receive his infusion at the [facility] infusion center.   He was instructed to continue taking his over-the-counter iron supplement.  He was encouraged to continue to take this with vitamin C as that helps with iron absorption.  We did discuss constipation education with iron supplements.   He was instructed to follow up with GI given his anemia, hx AVMs, GERD, nausea/vomiting. His PCP recently started him on phengergan.   The patient also will continue to follow closely with GI.  He is expected to have a repeat colonoscopy in ***  He is aware to refrain from using any NSAIDs.  He will continue taking a PPI.   The patient voices understanding of current disease status and treatment options and is in agreement with the current care plan.  All questions were answered. The patient knows to call the clinic with any problems, questions or concerns. We can certainly see the patient much sooner if necessary.    The patient voices understanding of current disease status and treatment options and is in agreement with the current care plan.  All questions were answered. The patient knows to call the clinic with any problems, questions or concerns. We can certainly see the patient much sooner if necessary.  Thank you so much for allowing me to participate in the care of [name]. I will continue to follow up the patient with you and assist in his care.  I spent {CHL ONC TIME VISIT - [id number]} counseling the patient face to face. The total time spent in the appointment was {CHL ONC TIME VISIT -  [id number]:[id number]}.  Disclaimer: This note was dictated with voice recognition software. Similar sounding words can inadvertently be transcribed and may not be corrected upon review.   Quinn Bartling L Rayshun Kandler April 28, 2022, 9:19 AM

## 2022-04-29 ENCOUNTER — Inpatient Hospital Stay: Payer: Medicare Other | Attending: Registered Nurse

## 2022-04-29 ENCOUNTER — Inpatient Hospital Stay: Payer: Medicare Other | Admitting: Physician Assistant

## 2022-05-24 ENCOUNTER — Ambulatory Visit: Payer: Medicare Other | Attending: Internal Medicine | Admitting: Internal Medicine

## 2022-05-31 ENCOUNTER — Encounter: Payer: Self-pay | Admitting: Internal Medicine

## 2022-07-01 ENCOUNTER — Ambulatory Visit (INDEPENDENT_AMBULATORY_CARE_PROVIDER_SITE_OTHER): Payer: Medicare Other | Admitting: Podiatry

## 2022-07-01 DIAGNOSIS — Z91199 Patient's noncompliance with other medical treatment and regimen due to unspecified reason: Secondary | ICD-10-CM

## 2022-07-01 NOTE — Progress Notes (Signed)
Pt was a no show for apt, no charge 

## 2022-11-17 ENCOUNTER — Emergency Department (HOSPITAL_COMMUNITY): Payer: 59

## 2022-11-17 ENCOUNTER — Other Ambulatory Visit: Payer: Self-pay

## 2022-11-17 ENCOUNTER — Emergency Department (HOSPITAL_COMMUNITY)
Admission: EM | Admit: 2022-11-17 | Discharge: 2022-11-17 | Disposition: A | Discharge status: Home / Self Care | Payer: 59 | Attending: Emergency Medicine | Admitting: Emergency Medicine

## 2022-11-17 DIAGNOSIS — R519 Headache, unspecified: Secondary | ICD-10-CM | POA: Diagnosis not present

## 2022-11-17 DIAGNOSIS — E876 Hypokalemia: Secondary | ICD-10-CM | POA: Diagnosis not present

## 2022-11-17 DIAGNOSIS — I160 Hypertensive urgency: Secondary | ICD-10-CM

## 2022-11-17 DIAGNOSIS — I10 Essential (primary) hypertension: Secondary | ICD-10-CM | POA: Diagnosis present

## 2022-11-17 LAB — COMPREHENSIVE METABOLIC PANEL
ALT: 12 U/L (ref 0–44)
AST: 15 U/L (ref 15–41)
Albumin: 3.6 g/dL (ref 3.5–5.0)
Alkaline Phosphatase: 100 U/L (ref 38–126)
Anion gap: 10 (ref 5–15)
BUN: 9 mg/dL (ref 8–23)
CO2: 26 mmol/L (ref 22–32)
Calcium: 9 mg/dL (ref 8.9–10.3)
Chloride: 104 mmol/L (ref 98–111)
Creatinine, Ser: 0.99 mg/dL (ref 0.61–1.24)
GFR, Estimated: >60 mL/min (ref >60)
Glucose, Bld: 97 mg/dL (ref 70–99)
Potassium: 2.9 mmol/L — ABNORMAL LOW (ref 3.5–5.1)
Sodium: 140 mmol/L (ref 135–145)
Total Bilirubin: 0.4 mg/dL (ref 0.3–1.2)
Total Protein: 7 g/dL (ref 6.5–8.1)

## 2022-11-17 LAB — CBC
HCT: 35.6 % — ABNORMAL LOW (ref 39.0–52.0)
Hemoglobin: 10.1 g/dL — ABNORMAL LOW (ref 13.0–17.0)
MCH: 22.9 pg — ABNORMAL LOW (ref 26.0–34.0)
MCHC: 28.4 g/dL — ABNORMAL LOW (ref 30.0–36.0)
MCV: 80.5 fL (ref 80.0–100.0)
Platelets: 403 10*3/uL — ABNORMAL HIGH (ref 150–400)
RBC: 4.42 MIL/uL (ref 4.22–5.81)
RDW: 17.4 % — ABNORMAL HIGH (ref 11.5–15.5)
WBC: 7.2 10*3/uL (ref 4.0–10.5)
nRBC: 0 % (ref 0.0–0.2)

## 2022-11-17 MED ORDER — HYDRALAZINE HCL 25 MG PO TABS
50.0000 mg | ORAL_TABLET | Freq: Once | ORAL | Status: AC
  Administered 2022-11-17: 50 mg via ORAL
  Filled 2022-11-17: qty 2

## 2022-11-17 MED ORDER — POTASSIUM CHLORIDE CRYS ER 20 MEQ PO TBCR
40.0000 meq | EXTENDED_RELEASE_TABLET | Freq: Once | ORAL | Status: AC
  Administered 2022-11-17: 40 meq via ORAL
  Filled 2022-11-17: qty 2

## 2022-11-17 MED ORDER — LISINOPRIL 20 MG PO TABS
40.0000 mg | ORAL_TABLET | Freq: Once | ORAL | Status: AC
  Administered 2022-11-17: 40 mg via ORAL
  Filled 2022-11-17: qty 2

## 2022-11-17 MED ORDER — HYDRALAZINE HCL 20 MG/ML IJ SOLN
5.0000 mg | Freq: Once | INTRAMUSCULAR | Status: AC
  Administered 2022-11-17: 5 mg via INTRAVENOUS
  Filled 2022-11-17: qty 1

## 2022-11-17 MED ORDER — LORAZEPAM 1 MG PO TABS
1.0000 mg | ORAL_TABLET | Freq: Once | ORAL | Status: AC
  Administered 2022-11-17: 1 mg via ORAL
  Filled 2022-11-17: qty 1

## 2022-11-17 MED ORDER — POTASSIUM CHLORIDE 10 MEQ/100ML IV SOLN
10.0000 meq | Freq: Once | INTRAVENOUS | Status: AC
  Administered 2022-11-17: 10 meq via INTRAVENOUS
  Filled 2022-11-17: qty 100

## 2022-11-17 NOTE — ED Notes (Signed)
Evlyn Kanner (Brother) of [name] called the PTs wheelchair is at the doctors office. If PT is discharge from ED to call brother to get the wheelchiar [phone].

## 2022-11-17 NOTE — ED Provider Notes (Signed)
[facility] AT [facility] Provider Note   CSN: [id number] Arrival date & time: 11/17/22  1221     History  Chief Complaint  Patient presents with   Hypertension   Headache    [name] is a 72 y.o. male with medical history of back pain, hepatitis C, gout, hyperlipidemia, insomnia, prostate cancer, shortness of breath, ischemic colitis, stroke in 2016, not anticoagulated.  Patient presents to the ED for evaluation of high blood pressure.  Patient reports that he went to his doctor's office this morning for checkup.  Patient reports that while at his doctor's office he was noted to have high blood pressure within systolic pressure of 218.  The patient was sent here for further management.  Patient states he also had 1 episode of nausea and vomiting at his doctor's office however denies any nausea now.  Denies abdominal pain, diarrhea.  Denies headache, blurred vision, chest pain or shortness of breath.  Patient reports compliance on blood pressure medication.  Patient unable to tell me name of this medication.  Patient reports it is 2 tablets and he took 1 this morning.  Patient states he has history of CVA, does not take anticoagulation.   Hypertension Associated symptoms include headaches. Pertinent negatives include no chest pain and no shortness of breath.  Headache      Home Medications Prior to Admission medications   Medication Sig Start Date End Date Taking? Authorizing Provider  acetaminophen (TYLENOL) 325 MG tablet Take 2 tablets (650 mg total) by mouth every 6 (six) hours as needed for moderate pain or headache. 12/27/20   Nelson Chimes, Loura Halt, MD  buPROPion (WELLBUTRIN SR) 200 MG 12 hr tablet TAKE 1 TABLET BY MOUTH DAILY 02/19/22   Deeann Saint, MD  cyclobenzaprine (FLEXERIL) 5 MG tablet TAKE 1 TABLET BY MOUTH TWICE A DAY AS NEEDED FOR MUSCLE SPASMS 10/27/20   Deeann Saint, MD  FEROSUL 325 (65 Fe) MG tablet TAKE 1 TABLET BY MOUTH  THREE TIMES A DAY WITH MEALS 02/19/22   Deeann Saint, MD  furosemide (LASIX) 20 MG tablet Take 1 tablet (20 mg total) by mouth 2 (two) times daily. 09/28/21   Deeann Saint, MD  gabapentin (NEURONTIN) 300 MG capsule Take by mouth. 01/07/21   [provider]  hydrALAZINE (APRESOLINE) 25 MG tablet TAKE 1 TABLET BY MOUTH THREE TIMES A DAY 02/19/22   Deeann Saint, MD  HYDROcodone-acetaminophen (NORCO/VICODIN) 5-325 MG tablet Take 2 tablets by mouth every 6 (six) hours as needed. 04/07/21   Burchette, Elberta Fortis, MD  ibuprofen (ADVIL) 600 MG tablet Take by mouth. 01/07/21   [provider]  lisinopril (ZESTRIL) 40 MG tablet Take 1 tablet by mouth daily.    [provider]  magnesium oxide (MAG-OX) 400 MG tablet Take 400 mg by mouth daily.    [provider]  Multiple Vitamin (MULTIVITAMINS PO) Take 1 tablet by mouth daily. 06/04/20   [provider]  mupirocin ointment (BACTROBAN) 2 % APPLY THIN LAYER TO AFFECTED AREA AS DIRECTED BY YOUR MEDICAL PROVIDER APPLY TO CLEAN DRY SKIN 02/27/21   [provider]  pantoprazole (PROTONIX) 40 MG tablet TAKE 1 TABLET BY MOUTH TWICE A DAY BEFORE MEALS 03/03/22   Deeann Saint, MD  potassium chloride SA (KLOR-CON M) 20 MEQ tablet TAKE 1 TABLET BY MOUTH DAILY 12/08/21   Deeann Saint, MD  predniSONE (DELTASONE) 10 MG tablet Take 5 tabs on day 1, 4  tabs on day 2, 3 tabs on day 3, 2 tabs on day 4, 1 tab on day 5. 06/18/21   Deeann Saint, MD  QUEtiapine (SEROQUEL) 200 MG tablet Take 1 tablet (200 mg total) by mouth at bedtime. 02/12/22   Deeann Saint, MD  senna-docusate (SENOKOT-S) 8.6-50 MG tablet Take 1 tablet by mouth 2 (two) times daily. 12/27/20   Amin, Loura Halt, MD  sertraline (ZOLOFT) 100 MG tablet Take 1 tablet (100 mg total) by mouth daily. 02/05/22   Ranelle Oyster, MD  silver sulfADIAZINE (SILVADENE) 1 % cream Apply 1 application topically daily. 04/07/21   Burchette, Elberta Fortis, MD  sodium  chloride (OCEAN) 0.65 % SOLN nasal spray Place 1 spray into both nostrils as needed for congestion. 08/18/18   Love, Evlyn Kanner, PA-C  spironolactone (ALDACTONE) 25 MG tablet Take 1 tablet (25 mg total) by mouth daily. 12/03/21 11/28/22  Maisie Fus, MD  thiamine 100 MG tablet Take by mouth. 01/07/21   [provider]      Allergies    Penicillins and Sudafed [pseudoephedrine]    Review of Systems   Review of Systems  Eyes:  Negative for visual disturbance.  Respiratory:  Negative for shortness of breath.   Cardiovascular:  Negative for chest pain.  Neurological:  Positive for headaches.  All other systems reviewed and are negative.   Physical Exam Updated Vital Signs BP (!) 152/96   Pulse (!) 105   Temp 98.5 F (36.9 C) (Oral)   Resp (!) 30   Ht 5\' 9"  (1.753 m)   Wt 93.4 kg   SpO2 99%   BMI 30.42 kg/m  Physical Exam Vitals and nursing note reviewed.  Constitutional:      General: He is not in acute distress.    Appearance: Normal appearance. He is not ill-appearing, toxic-appearing or diaphoretic.  HENT:     Head: Normocephalic and atraumatic.     Nose: Nose normal.     Mouth/Throat:     Mouth: Mucous membranes are moist.     Pharynx: Oropharynx is clear.  Eyes:     Extraocular Movements: Extraocular movements intact.     Conjunctiva/sclera: Conjunctivae normal.     Pupils: Pupils are equal, round, and reactive to light.  Cardiovascular:     Rate and Rhythm: Normal rate and regular rhythm.  Pulmonary:     Effort: Pulmonary effort is normal.     Breath sounds: Normal breath sounds. No wheezing.  Abdominal:     General: Abdomen is flat. Bowel sounds are normal.     Palpations: Abdomen is soft.     Tenderness: There is no abdominal tenderness.  Musculoskeletal:     Cervical back: Normal range of motion and neck supple. No tenderness.  Skin:    General: Skin is warm and dry.     Capillary Refill: Capillary refill takes less than 2 seconds.  Neurological:      General: No focal deficit present.     Mental Status: He is alert and oriented to person, place, and time.     GCS: GCS eye subscore is 4. GCS verbal subscore is 5. GCS motor subscore is 6.     Cranial Nerves: Cranial nerves 2-12 are intact. No cranial nerve deficit.     Sensory: Sensation is intact. No sensory deficit.     Motor: Motor function is intact. No weakness.     Coordination: Coordination is intact. Heel to [facility] Test normal.  ED Results / Procedures / Treatments   Labs (all labs ordered are listed, but only abnormal results are displayed) Labs Reviewed  CBC - Abnormal; Notable for the following components:      Result Value   Hemoglobin 10.1 (*)    HCT 35.6 (*)    MCH 22.9 (*)    MCHC 28.4 (*)    RDW 17.4 (*)    Platelets 403 (*)    All other components within normal limits  COMPREHENSIVE METABOLIC PANEL - Abnormal; Notable for the following components:   Potassium 2.9 (*)    All other components within normal limits  URINALYSIS, ROUTINE W REFLEX MICROSCOPIC    EKG EKG Interpretation  Date/Time:  Wednesday Nov 17 2022 14:28:29 EDT Ventricular Rate:  64 PR Interval:  214 QRS Duration: 110 QT Interval:  462 QTC Calculation: 477 R Axis:   4 Text Interpretation: Sinus rhythm Borderline prolonged PR interval Abnormal R-wave progression, early transition Minimal ST depression, lateral leads Borderline prolonged QT interval QT slightly longer , otherwise unchanged Confirmed by Vivien Rossetti ([id number]) on 11/17/2022 4:02:14 PM  Radiology CT Head Wo Contrast  Result Date: 11/17/2022 CLINICAL DATA:  Provided history: Hypertension. Headache. EXAM: CT HEAD WITHOUT CONTRAST TECHNIQUE: Contiguous axial images were obtained from the base of the skull through the vertex without intravenous contrast. RADIATION DOSE REDUCTION: This exam was performed according to the departmental dose-optimization program which includes automated exposure control, adjustment of the mA  and/or kV according to patient size and/or use of iterative reconstruction technique. COMPARISON:  Prior head CT examinations 02/12/2020 and earlier. Brain MRI 05/30/2015. FINDINGS: Brain: Mild generalized cerebral atrophy. Redemonstrated chronic lacunar infarcts within the right centrum semiovale. Background moderate patchy and ill-defined hypoattenuation within the cerebral white matter, nonspecific but compatible with chronic small vessel ischemic disease. There is asymmetric CSF density prominence overlying the anterolateral left frontal lobe (measuring up to 9 mm in thickness), slightly more prominent than on the prior examination of 02/12/2020. Chronic insult within the left thalamus at site of remote parenchymal hemorrhage. Chronic lacunar infarcts within the right aspect of the pons, better appreciated on the prior brain MRI of 05/30/2015. No midline shift. There is no acute intracranial hemorrhage. No demarcated cortical infarct. No evidence of an intracranial mass. Vascular: No hyperdense vessel. Atherosclerotic calcifications. Skull: No fracture or aggressive osseous lesion. Sinuses/Orbits: No mass or acute finding within the imaged orbits. 12 mm mucous retention cyst within the left maxillary sinus. IMPRESSION: 1.  No evidence of an acute intracranial abnormality. 2. The asymmetric CSF density prominence overlying the anterolateral left frontal lobe (measuring up to 9 mm in thickness), slightly more prominent than on the prior examination of 02/12/2020. A small chronic subdural hygroma/chronic subdural hematoma at this site cannot be excluded. No midline shift. 3. Parenchymal atrophy, chronic small ischemic disease and chronic infarcts as described. 4. Chronic insult within the left thalamus at site of remote parenchymal hemorrhage. 5. 12 mm mucous retention cyst within the left maxillary sinus. Electronically Signed   By: Jackey Loge D.O.   On: 11/17/2022 13:53    Procedures Procedures    Medications Ordered in ED Medications  hydrALAZINE (APRESOLINE) injection 5 mg (5 mg Intravenous Given 11/17/22 1425)  hydrALAZINE (APRESOLINE) tablet 50 mg (50 mg Oral Given 11/17/22 1520)  potassium chloride 10 mEq in 100 mL IVPB (0 mEq Intravenous Stopped 11/17/22 1716)  potassium chloride SA (KLOR-CON M) CR tablet 40 mEq (40 mEq Oral Given 11/17/22 1519)  LORazepam (ATIVAN) tablet 1  mg (1 mg Oral Given 11/17/22 1520)  LORazepam (ATIVAN) tablet 1 mg (1 mg Oral Given 11/17/22 1611)  lisinopril (ZESTRIL) tablet 40 mg (40 mg Oral Given 11/17/22 1611)    ED Course/ Medical Decision Making/ A&P  Medical Decision Making Amount and/or Complexity of Data Reviewed Labs: ordered. Radiology: ordered.  Risk Prescription drug management.   Number 72 year old no presents to the ED for evaluation of hypertension and headache.  Please see HPI for further details.  On examination the patient is afebrile, nontachycardic.  His lung sounds are clear bilaterally, he is not hypoxic.  His abdomen is soft and compressible throughout.  Neurological examination at baseline.  No focal neurodeficits noted.  Nontoxic in appearance.  CBC without leukocytosis, baseline hemoglobin.  CMP with potassium 2.9, no other electrolyte derangement, no elevated creatinine.  Patient provided 10 mEq IV potassium, 40 mEq oral potassium.  Patient CT head unremarkable.  Patient given 2 mg Ativan for anxiety.  Patient given initially 5 mg hydralazine.  No change in blood pressure.  Patient given 50 mg hydralazine orally.  No change in blood pressure.  Patient given 40 mg lisinopril orally.    Patient blood pressure has decreased at this time to 152/96.  At this time the patient stable to discharge home.  The patient is requesting discharge.  The patient will be advised to follow-up with his PCP for further management of his blood pressure.  Patient encouraged to continue taking blood pressure medication at home as prescribed.  Return  precautions provided and he voiced understanding.  All questions answered to his satisfaction.  Stable to discharge.   Final Clinical Impression(s) / ED Diagnoses Final diagnoses:  Hypertensive urgency    Rx / DC Orders ED Discharge Orders     None         Clent Ridges 11/17/22 1756    Rondel Baton, MD 11/17/22 1759

## 2022-11-17 NOTE — ED Notes (Signed)
Waiting on PTAR... 

## 2022-11-17 NOTE — ED Triage Notes (Signed)
Pt arrived via [facility] from [facility] (primary care office) with c/c of HTN. Pt was having dizziness, lightheaded and headache. Pt has hx of 2 strokes. Bilateral leg weakness from previous stroke.   200/110, 80HR, CBG 116

## 2022-11-17 NOTE — Discharge Instructions (Signed)
Return to the ED with any new symptoms such as chest pain, shortness of breath, headache or blurred vision Follow-up with your PCP in the next 5 days for reevaluation of blood pressure Please take your blood pressure medication at home as prescribed Please read attached guide concerning hypertension Please begin recording your blood pressure at home

## 2022-11-17 NOTE — ED Notes (Signed)
Please call brother for update  [phone]  [phone]

## 2023-05-09 ENCOUNTER — Ambulatory Visit
Admission: RE | Admit: 2023-05-09 | Discharge: 2023-05-09 | Disposition: A | Discharge status: Home / Self Care | Payer: 59 | Source: Ambulatory Visit | Attending: Nurse Practitioner | Admitting: Nurse Practitioner

## 2023-05-09 ENCOUNTER — Other Ambulatory Visit: Payer: Self-pay | Admitting: Nurse Practitioner

## 2023-05-09 DIAGNOSIS — R109 Unspecified abdominal pain: Secondary | ICD-10-CM

## 2023-05-10 ENCOUNTER — Emergency Department (HOSPITAL_COMMUNITY): Payer: 59

## 2023-05-10 ENCOUNTER — Encounter (HOSPITAL_COMMUNITY): Payer: Self-pay | Admitting: Emergency Medicine

## 2023-05-10 ENCOUNTER — Inpatient Hospital Stay (HOSPITAL_COMMUNITY)
Admission: EM | Admit: 2023-05-10 | Discharge: 2023-05-15 | DRG: 388 | Disposition: A | Discharge status: Home / Self Care | Payer: 59 | Attending: Internal Medicine | Admitting: Internal Medicine

## 2023-05-10 ENCOUNTER — Other Ambulatory Visit: Payer: Self-pay

## 2023-05-10 DIAGNOSIS — E860 Dehydration: Secondary | ICD-10-CM | POA: Diagnosis present

## 2023-05-10 DIAGNOSIS — M25551 Pain in right hip: Secondary | ICD-10-CM | POA: Diagnosis present

## 2023-05-10 DIAGNOSIS — Z9049 Acquired absence of other specified parts of digestive tract: Secondary | ICD-10-CM | POA: Diagnosis not present

## 2023-05-10 DIAGNOSIS — Z8673 Personal history of transient ischemic attack (TIA), and cerebral infarction without residual deficits: Secondary | ICD-10-CM

## 2023-05-10 DIAGNOSIS — E785 Hyperlipidemia, unspecified: Secondary | ICD-10-CM | POA: Diagnosis present

## 2023-05-10 DIAGNOSIS — Z820 Family history of epilepsy and other diseases of the nervous system: Secondary | ICD-10-CM

## 2023-05-10 DIAGNOSIS — E876 Hypokalemia: Secondary | ICD-10-CM | POA: Diagnosis present

## 2023-05-10 DIAGNOSIS — Z1152 Encounter for screening for COVID-19: Secondary | ICD-10-CM

## 2023-05-10 DIAGNOSIS — M109 Gout, unspecified: Secondary | ICD-10-CM | POA: Diagnosis present

## 2023-05-10 DIAGNOSIS — Z993 Dependence on wheelchair: Secondary | ICD-10-CM | POA: Diagnosis not present

## 2023-05-10 DIAGNOSIS — K449 Diaphragmatic hernia without obstruction or gangrene: Secondary | ICD-10-CM | POA: Diagnosis present

## 2023-05-10 DIAGNOSIS — K56609 Unspecified intestinal obstruction, unspecified as to partial versus complete obstruction: Secondary | ICD-10-CM | POA: Diagnosis present

## 2023-05-10 DIAGNOSIS — I720 Aneurysm of carotid artery: Principal | ICD-10-CM

## 2023-05-10 DIAGNOSIS — Z8249 Family history of ischemic heart disease and other diseases of the circulatory system: Secondary | ICD-10-CM

## 2023-05-10 DIAGNOSIS — E872 Acidosis, unspecified: Secondary | ICD-10-CM | POA: Diagnosis present

## 2023-05-10 DIAGNOSIS — N179 Acute kidney failure, unspecified: Secondary | ICD-10-CM | POA: Diagnosis present

## 2023-05-10 DIAGNOSIS — G8929 Other chronic pain: Secondary | ICD-10-CM | POA: Diagnosis present

## 2023-05-10 DIAGNOSIS — Z8546 Personal history of malignant neoplasm of prostate: Secondary | ICD-10-CM

## 2023-05-10 DIAGNOSIS — Y92009 Unspecified place in unspecified non-institutional (private) residence as the place of occurrence of the external cause: Secondary | ICD-10-CM

## 2023-05-10 DIAGNOSIS — R6511 Systemic inflammatory response syndrome (SIRS) of non-infectious origin with acute organ dysfunction: Secondary | ICD-10-CM | POA: Diagnosis present

## 2023-05-10 DIAGNOSIS — I959 Hypotension, unspecified: Secondary | ICD-10-CM | POA: Diagnosis present

## 2023-05-10 DIAGNOSIS — K219 Gastro-esophageal reflux disease without esophagitis: Secondary | ICD-10-CM | POA: Diagnosis present

## 2023-05-10 DIAGNOSIS — K565 Intestinal adhesions [bands], unspecified as to partial versus complete obstruction: Secondary | ICD-10-CM | POA: Diagnosis present

## 2023-05-10 DIAGNOSIS — D75839 Thrombocytosis, unspecified: Secondary | ICD-10-CM | POA: Diagnosis present

## 2023-05-10 DIAGNOSIS — W19XXXA Unspecified fall, initial encounter: Secondary | ICD-10-CM | POA: Diagnosis present

## 2023-05-10 DIAGNOSIS — I110 Hypertensive heart disease with heart failure: Secondary | ICD-10-CM | POA: Diagnosis present

## 2023-05-10 DIAGNOSIS — D508 Other iron deficiency anemias: Secondary | ICD-10-CM | POA: Diagnosis not present

## 2023-05-10 DIAGNOSIS — B1920 Unspecified viral hepatitis C without hepatic coma: Secondary | ICD-10-CM | POA: Diagnosis present

## 2023-05-10 DIAGNOSIS — R6510 Systemic inflammatory response syndrome (SIRS) of non-infectious origin without acute organ dysfunction: Secondary | ICD-10-CM | POA: Diagnosis present

## 2023-05-10 DIAGNOSIS — Z79899 Other long term (current) drug therapy: Secondary | ICD-10-CM

## 2023-05-10 DIAGNOSIS — Z720 Tobacco use: Secondary | ICD-10-CM | POA: Diagnosis present

## 2023-05-10 DIAGNOSIS — F17210 Nicotine dependence, cigarettes, uncomplicated: Secondary | ICD-10-CM | POA: Diagnosis present

## 2023-05-10 DIAGNOSIS — Z888 Allergy status to other drugs, medicaments and biological substances status: Secondary | ICD-10-CM

## 2023-05-10 DIAGNOSIS — D509 Iron deficiency anemia, unspecified: Secondary | ICD-10-CM | POA: Diagnosis present

## 2023-05-10 DIAGNOSIS — K559 Vascular disorder of intestine, unspecified: Secondary | ICD-10-CM | POA: Diagnosis present

## 2023-05-10 DIAGNOSIS — I5032 Chronic diastolic (congestive) heart failure: Secondary | ICD-10-CM | POA: Diagnosis present

## 2023-05-10 DIAGNOSIS — M549 Dorsalgia, unspecified: Secondary | ICD-10-CM | POA: Diagnosis present

## 2023-05-10 DIAGNOSIS — Z808 Family history of malignant neoplasm of other organs or systems: Secondary | ICD-10-CM

## 2023-05-10 LAB — URINALYSIS, W/ REFLEX TO CULTURE (INFECTION SUSPECTED)
Bilirubin Urine: NEGATIVE
Glucose, UA: NEGATIVE mg/dL
Ketones, ur: NEGATIVE mg/dL
Nitrite: NEGATIVE
Protein, ur: 100 mg/dL — AB
Specific Gravity, Urine: 1.019 (ref 1.005–1.030)
WBC, UA: >50 WBC/hpf (ref 0–5)
pH: 5 (ref 5.0–8.0)

## 2023-05-10 LAB — CBC WITH DIFFERENTIAL/PLATELET
Abs Immature Granulocytes: 0.1 10*3/uL — ABNORMAL HIGH (ref 0.00–0.07)
Basophils Absolute: 0 10*3/uL (ref 0.0–0.1)
Basophils Relative: 0 %
Eosinophils Absolute: 0 10*3/uL (ref 0.0–0.5)
Eosinophils Relative: 0 %
HCT: 29.1 % — ABNORMAL LOW (ref 39.0–52.0)
Hemoglobin: 7.3 g/dL — ABNORMAL LOW (ref 13.0–17.0)
Immature Granulocytes: 1 %
Lymphocytes Relative: 1 %
Lymphs Abs: 0.2 10*3/uL — ABNORMAL LOW (ref 0.7–4.0)
MCH: 17.1 pg — ABNORMAL LOW (ref 26.0–34.0)
MCHC: 25.1 g/dL — ABNORMAL LOW (ref 30.0–36.0)
MCV: 68.1 fL — ABNORMAL LOW (ref 80.0–100.0)
Monocytes Absolute: 0.9 10*3/uL (ref 0.1–1.0)
Monocytes Relative: 6 %
Neutro Abs: 13.6 10*3/uL — ABNORMAL HIGH (ref 1.7–7.7)
Neutrophils Relative %: 92 %
Platelets: 479 10*3/uL — ABNORMAL HIGH (ref 150–400)
RBC: 4.27 MIL/uL (ref 4.22–5.81)
RDW: 19.9 % — ABNORMAL HIGH (ref 11.5–15.5)
WBC: 14.9 10*3/uL — ABNORMAL HIGH (ref 4.0–10.5)
nRBC: 0.5 % — ABNORMAL HIGH (ref 0.0–0.2)

## 2023-05-10 LAB — I-STAT CHEM 8, ED
BUN: 24 mg/dL — ABNORMAL HIGH (ref 8–23)
Calcium, Ion: 1.13 mmol/L — ABNORMAL LOW (ref 1.15–1.40)
Chloride: 99 mmol/L (ref 98–111)
Creatinine, Ser: 3.6 mg/dL — ABNORMAL HIGH (ref 0.61–1.24)
Glucose, Bld: 126 mg/dL — ABNORMAL HIGH (ref 70–99)
HCT: 30 % — ABNORMAL LOW (ref 39.0–52.0)
Hemoglobin: 10.2 g/dL — ABNORMAL LOW (ref 13.0–17.0)
Potassium: 3.3 mmol/L — ABNORMAL LOW (ref 3.5–5.1)
Sodium: 139 mmol/L (ref 135–145)
TCO2: 19 mmol/L — ABNORMAL LOW (ref 22–32)

## 2023-05-10 LAB — SARS CORONAVIRUS 2 BY RT PCR: SARS Coronavirus 2 by RT PCR: NEGATIVE

## 2023-05-10 LAB — I-STAT CG4 LACTIC ACID, ED
Lactic Acid, Venous: 11.9 mmol/L (ref 0.5–1.9)
Lactic Acid, Venous: 7 mmol/L (ref 0.5–1.9)

## 2023-05-10 LAB — LIPASE, BLOOD: Lipase: 19 U/L (ref 11–51)

## 2023-05-10 LAB — CBG MONITORING, ED: Glucose-Capillary: 137 mg/dL — ABNORMAL HIGH (ref 70–99)

## 2023-05-10 LAB — COMPREHENSIVE METABOLIC PANEL
ALT: 11 U/L (ref 0–44)
AST: 22 U/L (ref 15–41)
Albumin: 3.4 g/dL — ABNORMAL LOW (ref 3.5–5.0)
Alkaline Phosphatase: 74 U/L (ref 38–126)
Anion gap: 22 — ABNORMAL HIGH (ref 5–15)
BUN: 25 mg/dL — ABNORMAL HIGH (ref 8–23)
CO2: 18 mmol/L — ABNORMAL LOW (ref 22–32)
Calcium: 9.7 mg/dL (ref 8.9–10.3)
Chloride: 100 mmol/L (ref 98–111)
Creatinine, Ser: 3.46 mg/dL — ABNORMAL HIGH (ref 0.61–1.24)
GFR, Estimated: 18 mL/min — ABNORMAL LOW (ref >60)
Glucose, Bld: 127 mg/dL — ABNORMAL HIGH (ref 70–99)
Potassium: 3.4 mmol/L — ABNORMAL LOW (ref 3.5–5.1)
Sodium: 140 mmol/L (ref 135–145)
Total Bilirubin: 0.6 mg/dL (ref 0.3–1.2)
Total Protein: 6.6 g/dL (ref 6.5–8.1)

## 2023-05-10 LAB — FERRITIN: Ferritin: 5 ng/mL — ABNORMAL LOW (ref 24–336)

## 2023-05-10 LAB — SODIUM, URINE, RANDOM: Sodium, Ur: 28 mmol/L

## 2023-05-10 LAB — RETICULOCYTES
Immature Retic Fract: 18.6 % — ABNORMAL HIGH (ref 2.3–15.9)
RBC.: 4.62 MIL/uL (ref 4.22–5.81)
Retic Count, Absolute: 58.7 10*3/uL (ref 19.0–186.0)
Retic Ct Pct: 1.3 % (ref 0.4–3.1)

## 2023-05-10 LAB — IRON AND TIBC
Iron: <5 ug/dL — ABNORMAL LOW (ref 45–182)
TIBC: 531 ug/dL — ABNORMAL HIGH (ref 250–450)

## 2023-05-10 LAB — HEMOGLOBIN AND HEMATOCRIT, BLOOD
HCT: 30.7 % — ABNORMAL LOW (ref 39.0–52.0)
Hemoglobin: 8.1 g/dL — ABNORMAL LOW (ref 13.0–17.0)

## 2023-05-10 LAB — FOLATE: Folate: 6.7 ng/mL (ref >5.9)

## 2023-05-10 LAB — D-DIMER, QUANTITATIVE: D-Dimer, Quant: <0.27 ug{FEU}/mL (ref 0.00–0.50)

## 2023-05-10 LAB — CK: Total CK: 292 U/L (ref 49–397)

## 2023-05-10 LAB — LACTIC ACID, PLASMA: Lactic Acid, Venous: 3.3 mmol/L (ref 0.5–1.9)

## 2023-05-10 LAB — CREATININE, URINE, RANDOM: Creatinine, Urine: 350 mg/dL

## 2023-05-10 LAB — VITAMIN B12: Vitamin B-12: 232 pg/mL (ref 180–914)

## 2023-05-10 MED ORDER — POTASSIUM CHLORIDE 10 MEQ/100ML IV SOLN
10.0000 meq | INTRAVENOUS | Status: AC
  Administered 2023-05-10 (×2): 10 meq via INTRAVENOUS
  Filled 2023-05-10 (×2): qty 100

## 2023-05-10 MED ORDER — NICOTINE 14 MG/24HR TD PT24
14.0000 mg | MEDICATED_PATCH | Freq: Every day | TRANSDERMAL | Status: DC
  Administered 2023-05-10 – 2023-05-15 (×6): 14 mg via TRANSDERMAL
  Filled 2023-05-10 (×6): qty 1

## 2023-05-10 MED ORDER — ACETAMINOPHEN 650 MG RE SUPP: 650.0000 mg | Freq: Four times a day (QID) | RECTAL | Status: DC | PRN

## 2023-05-10 MED ORDER — SODIUM CHLORIDE 0.9 % IV BOLUS
1000.0000 mL | Freq: Once | INTRAVENOUS | Status: AC
  Administered 2023-05-10: 1000 mL via INTRAVENOUS

## 2023-05-10 MED ORDER — METRONIDAZOLE 500 MG/100ML IV SOLN
500.0000 mg | Freq: Once | INTRAVENOUS | Status: AC
  Administered 2023-05-10: 500 mg via INTRAVENOUS
  Filled 2023-05-10: qty 100

## 2023-05-10 MED ORDER — DIATRIZOATE MEGLUMINE & SODIUM 66-10 % PO SOLN
90.0000 mL | Freq: Once | ORAL | Status: DC
  Filled 2023-05-10: qty 90

## 2023-05-10 MED ORDER — LACTATED RINGERS IV SOLN: INTRAVENOUS | Status: DC

## 2023-05-10 MED ORDER — MORPHINE SULFATE (PF) 2 MG/ML IV SOLN
2.0000 mg | INTRAVENOUS | Status: DC | PRN
  Administered 2023-05-10 (×2): 2 mg via INTRAVENOUS
  Filled 2023-05-10 (×2): qty 1

## 2023-05-10 MED ORDER — SODIUM CHLORIDE 0.9% FLUSH
3.0000 mL | Freq: Two times a day (BID) | INTRAVENOUS | Status: DC
  Administered 2023-05-10 – 2023-05-15 (×6): 3 mL via INTRAVENOUS

## 2023-05-10 MED ORDER — ACETAMINOPHEN 325 MG PO TABS
650.0000 mg | ORAL_TABLET | Freq: Four times a day (QID) | ORAL | Status: DC | PRN
  Administered 2023-05-13: 650 mg via ORAL
  Filled 2023-05-10: qty 2

## 2023-05-10 MED ORDER — STERILE WATER FOR INJECTION IV SOLN
INTRAVENOUS | Status: DC
  Filled 2023-05-10: qty 1000
  Filled 2023-05-10: qty 150

## 2023-05-10 MED ORDER — SODIUM CHLORIDE 0.9 % IV SOLN
2.0000 g | INTRAVENOUS | Status: DC
  Administered 2023-05-11 – 2023-05-14 (×4): 2 g via INTRAVENOUS
  Filled 2023-05-10 (×4): qty 20

## 2023-05-10 MED ORDER — DICLOFENAC SODIUM 1 % EX GEL
2.0000 g | Freq: Two times a day (BID) | CUTANEOUS | Status: DC
  Administered 2023-05-10 – 2023-05-15 (×10): 2 g via TOPICAL
  Filled 2023-05-10 (×2): qty 100

## 2023-05-10 MED ORDER — ONDANSETRON HCL 4 MG/2ML IJ SOLN
4.0000 mg | Freq: Four times a day (QID) | INTRAMUSCULAR | Status: DC | PRN
  Administered 2023-05-12: 4 mg via INTRAVENOUS
  Filled 2023-05-10: qty 2

## 2023-05-10 MED ORDER — ALBUTEROL SULFATE (2.5 MG/3ML) 0.083% IN NEBU: 2.5000 mg | INHALATION_SOLUTION | Freq: Four times a day (QID) | RESPIRATORY_TRACT | Status: DC | PRN

## 2023-05-10 MED ORDER — PANTOPRAZOLE SODIUM 40 MG IV SOLR
40.0000 mg | INTRAVENOUS | Status: DC
  Administered 2023-05-10 – 2023-05-15 (×6): 40 mg via INTRAVENOUS
  Filled 2023-05-10 (×6): qty 10

## 2023-05-10 MED ORDER — SODIUM CHLORIDE 0.9 % IV SOLN
2.0000 g | Freq: Once | INTRAVENOUS | Status: AC
  Administered 2023-05-10: 2 g via INTRAVENOUS
  Filled 2023-05-10: qty 20

## 2023-05-10 MED ORDER — METRONIDAZOLE 500 MG/100ML IV SOLN
500.0000 mg | Freq: Two times a day (BID) | INTRAVENOUS | Status: DC
  Administered 2023-05-10 – 2023-05-14 (×8): 500 mg via INTRAVENOUS
  Filled 2023-05-10 (×8): qty 100

## 2023-05-10 MED ORDER — SODIUM CHLORIDE 0.9 % IV BOLUS
500.0000 mL | Freq: Once | INTRAVENOUS | Status: AC
  Administered 2023-05-10: 500 mL via INTRAVENOUS

## 2023-05-10 NOTE — Consult Note (Signed)
Reason for Consult:vomiting Referring Provider: Maize Reckard is an 72 y.o. male.  HPI: 72 yo male with 3 days of abdominal pain, nausea and vomiting. Pain is mild over the left side.  Past Medical History:  Diagnosis Date   Arthritis    Sheria Lang ulcer, chronic 01/11/2018   Chronic back pain    GERD (gastroesophageal reflux disease)    uses Baking Soda   GIB (gastrointestinal bleeding) 2012   Glaucoma    right eye   Headache    occasionally   Hepatitis C    Hiatal hernia with gastroesophageal reflux disease and esophagitis 01/05/2018   History of blood transfusion    no abnormal reaction noted   History of gout    doesn't take any meds   Hyperlipidemia    not on any meds   Insomnia    takes Ambien nightly   Ischemic colitis (HCC) 2012   Joint pain    Nocturia    Numbness    both legs occasionally   PONV (postoperative nausea and vomiting)    Prostate cancer (HCC)    Shortness of breath dyspnea    rarely but when notices he can be lying/sitting/exertion.Dr.Hochrein is aware per pt   Stroke [facility]) 2016    Urinary frequency    Urinary urgency     Past Surgical History:  Procedure Laterality Date   APPENDECTOMY     BIOPSY  01/11/2018   Procedure: BIOPSY;  Surgeon: Iva Boop, MD;  Location: [facility] ENDOSCOPY;  Service: Endoscopy;;   COLONOSCOPY     COLONOSCOPY N/A 10/26/2015   Procedure: COLONOSCOPY;  Surgeon: Sherrilyn Rist, MD;  Location: [facility] ENDOSCOPY;  Service: Endoscopy;  Laterality: N/A;   ESOPHAGOGASTRODUODENOSCOPY     ESOPHAGOGASTRODUODENOSCOPY N/A 10/26/2015   Procedure: ESOPHAGOGASTRODUODENOSCOPY (EGD);  Surgeon: Sherrilyn Rist, MD;  Location: [facility] ENDOSCOPY;  Service: Endoscopy;  Laterality: N/A;   ESOPHAGOGASTRODUODENOSCOPY (EGD) WITH PROPOFOL N/A 01/11/2018   Procedure: ESOPHAGOGASTRODUODENOSCOPY (EGD) WITH PROPOFOL;  Surgeon: Iva Boop, MD;  Location: [facility] ENDOSCOPY;  Service: Endoscopy;  Laterality: N/A;    ESOPHAGOGASTRODUODENOSCOPY (EGD) WITH PROPOFOL N/A 12/26/2020   Procedure: ESOPHAGOGASTRODUODENOSCOPY (EGD) WITH PROPOFOL;  Surgeon: Meryl Dare, MD;  Location: [facility] ENDOSCOPY;  Service: Endoscopy;  Laterality: N/A;   HOT HEMOSTASIS N/A 12/26/2020   Procedure: HOT HEMOSTASIS (ARGON PLASMA COAGULATION/BICAP);  Surgeon: Meryl Dare, MD;  Location: [facility] ENDOSCOPY;  Service: Endoscopy;  Laterality: N/A;   INGUINAL HERNIA REPAIR Right 11/04/2014   Procedure: RIGHT INGUINAL HERNIA REPAIR WITH MESH;  Surgeon: Avel Peace, MD;  Location: [facility] OR;  Service: General;  Laterality: Right;   MASS EXCISION N/A 11/04/2014   Procedure: REMOVAL OF RIGHT GROIN SOFT TISSUE MASS;  Surgeon: Avel Peace, MD;  Location: [facility] OR;  Service: General;  Laterality: N/A;   Multiple abdominal surgeries     total of 13   PROSTATECTOMY     SMALL INTESTINE SURGERY      Family History  Problem Relation Age of Onset   Alzheimer's disease Father    Cancer Mother        Lymph node   Heart failure Brother 18       Transplant 13 years ago   CAD Brother    Heart disease Sister 36       MI    Social History:  reports that he has been smoking cigarettes. He has a 25 pack-year smoking history. He has never used smokeless tobacco. He reports  current alcohol use. He reports that he does not currently use drugs after having used the following drugs: Marijuana. Frequency: 5.00 times per week.  Allergies:  Allergies  Allergen Reactions   Sudafed [Pseudoephedrine] Palpitations and Other (See Comments)    Heart "races"    Medications: I have reviewed the patient's current medications.  Results for orders placed or performed during the hospital encounter of 05/10/23 (from the past 48 hour(s))  I-stat chem 8, ED     Status: Abnormal   Collection Time: 05/10/23  5:01 AM  Result Value Ref Range   Sodium 139 135 - 145 mmol/L   Potassium 3.3 (L) 3.5 - 5.1 mmol/L   Chloride 99 98 - 111 mmol/L   BUN 24 (H) 8 - 23 mg/dL    Creatinine, Ser 4.09 (H) 0.61 - 1.24 mg/dL   Glucose, Bld 811 (H) 70 - 99 mg/dL    Comment: Glucose reference range applies only to samples taken after fasting for at least 8 hours.   Calcium, Ion 1.13 (L) 1.15 - 1.40 mmol/L   TCO2 19 (L) 22 - 32 mmol/L   Hemoglobin 10.2 (L) 13.0 - 17.0 g/dL   HCT 91.4 (L) 78.2 - 95.6 %  I-Stat Lactic Acid     Status: Abnormal   Collection Time: 05/10/23  5:01 AM  Result Value Ref Range   Lactic Acid, Venous 11.9 (HH) 0.5 - 1.9 mmol/L   Comment NOTIFIED PHYSICIAN   Comprehensive metabolic panel     Status: Abnormal   Collection Time: 05/10/23  5:04 AM  Result Value Ref Range   Sodium 140 135 - 145 mmol/L   Potassium 3.4 (L) 3.5 - 5.1 mmol/L   Chloride 100 98 - 111 mmol/L   CO2 18 (L) 22 - 32 mmol/L   Glucose, Bld 127 (H) 70 - 99 mg/dL    Comment: Glucose reference range applies only to samples taken after fasting for at least 8 hours.   BUN 25 (H) 8 - 23 mg/dL   Creatinine, Ser 2.13 (H) 0.61 - 1.24 mg/dL   Calcium 9.7 8.9 - 08.6 mg/dL   Total Protein 6.6 6.5 - 8.1 g/dL   Albumin 3.4 (L) 3.5 - 5.0 g/dL   AST 22 15 - 41 U/L   ALT 11 0 - 44 U/L   Alkaline Phosphatase 74 38 - 126 U/L   Total Bilirubin 0.6 0.3 - 1.2 mg/dL   GFR, Estimated 18 (L) >60 mL/min    Comment: (NOTE) Calculated using the CKD-EPI Creatinine Equation (2021)    Anion gap 22 (H) 5 - 15    Comment: ELECTROLYTES REPEATED TO VERIFY Performed at [facility], [address] [place], [place] [address]   CBC with Differential     Status: Abnormal   Collection Time: 05/10/23  5:04 AM  Result Value Ref Range   WBC 14.9 (H) 4.0 - 10.5 K/uL   RBC 4.27 4.22 - 5.81 MIL/uL   Hemoglobin 7.3 (L) 13.0 - 17.0 g/dL    Comment: Reticulocyte Hemoglobin testing may be clinically indicated, consider ordering this additional test [id number]    HCT 29.1 (L) 39.0 - 52.0 %   MCV 68.1 (L) 80.0 - 100.0 fL   MCH 17.1 (L) 26.0 - 34.0 pg   MCHC 25.1 (L) 30.0 - 36.0 g/dL   RDW 13.2 (H) 44.0  - 15.5 %   Platelets 479 (H) 150 - 400 K/uL    Comment: REPEATED TO VERIFY   nRBC 0.5 (H)  0.0 - 0.2 %   Neutrophils Relative % 92 %   Neutro Abs 13.6 (H) 1.7 - 7.7 K/uL   Lymphocytes Relative 1 %   Lymphs Abs 0.2 (L) 0.7 - 4.0 K/uL   Monocytes Relative 6 %   Monocytes Absolute 0.9 0.1 - 1.0 K/uL   Eosinophils Relative 0 %   Eosinophils Absolute 0.0 0.0 - 0.5 K/uL   Basophils Relative 0 %   Basophils Absolute 0.0 0.0 - 0.1 K/uL   Immature Granulocytes 1 %   Abs Immature Granulocytes 0.10 (H) 0.00 - 0.07 K/uL    Comment: Performed at [facility], [address] [place], [place] [address]  Lipase, blood     Status: None   Collection Time: 05/10/23  5:04 AM  Result Value Ref Range   Lipase 19 11 - 51 U/L    Comment: Performed at [facility], [address] [place], [place] [address]  D-dimer, quantitative     Status: None   Collection Time: 05/10/23  5:04 AM  Result Value Ref Range   D-Dimer, Quant <0.27 0.00 - 0.50 ug/mL-FEU    Comment: (NOTE) At the manufacturer cut-off value of 0.5 g/mL FEU, this assay has a negative predictive value of 95-100%.This assay is intended for use in conjunction with a clinical pretest probability (PTP) assessment model to exclude pulmonary embolism (PE) and deep venous thrombosis (DVT) in outpatients suspected of PE or DVT. Results should be correlated with clinical presentation. Performed at [facility], [address] [place], [place] [address]   CBG monitoring, ED     Status: Abnormal   Collection Time: 05/10/23  6:00 AM  Result Value Ref Range   Glucose-Capillary 137 (H) 70 - 99 mg/dL    Comment: Glucose reference range applies only to samples taken after fasting for at least 8 hours.   Comment 1 Notify RN    Comment 2 Document in Chart   Type and screen [facility]     Status: None (Preliminary result)   Collection Time: 05/10/23  6:21 AM  Result Value Ref Range   ABO/RH(D) PENDING    Antibody  Screen PENDING    Sample Expiration      05/13/2023,2359 Performed at [facility], [address], [place], [place] [address]   I-Stat Lactic Acid     Status: Abnormal   Collection Time: 05/10/23  7:01 AM  Result Value Ref Range   Lactic Acid, Venous 7.0 (HH) 0.5 - 1.9 mmol/L   Comment NOTIFIED PHYSICIAN     CT ABDOMEN PELVIS WO CONTRAST  Result Date: 05/10/2023 CLINICAL DATA:  72 year old male with hypertension, fall. Pre-existing abdominal pain for several days. EXAM: CT ABDOMEN AND PELVIS WITHOUT CONTRAST TECHNIQUE: Multidetector CT imaging of the abdomen and pelvis was performed following the standard protocol without IV contrast. RADIATION DOSE REDUCTION: This exam was performed according to the departmental dose-optimization program which includes automated exposure control, adjustment of the mA and/or kV according to patient size and/or use of iterative reconstruction technique. COMPARISON:  Abdominal radiographs 05/09/2023. CT Abdomen and Pelvis 02/14/2017. FINDINGS: Lower chest: Calcified coronary artery atherosclerosis. Normal heart size. No pericardial or pleural effusion. But fluid distension of the distal thoracic esophagus and moderate size chronic hiatal hernia. Mild lower lobe atelectasis. Hepatobiliary: Negative noncontrast liver and gallbladder. Pancreas: Negative. Spleen: Negative. Adrenals/Urinary Tract: Stable since 2018, negative. Right renal vascular calcification. No convincing nephrolithiasis. Stomach/Bowel: Redundant but decompressed large bowel throughout the  abdomen and pelvis. Decompressed terminal ileum and ileocecal valve on coronal image 91. Decompressed distal small bowel loops throughout the right lower quadrant. Fluid distended stomach including hiatal hernia. Fluid distended duodenum and proximal jejunum. Air-fluid containing jejunal loops are dilated up to 5 cm diameter, mostly in the left abdomen. There is a dilated chronic small bowel anastomosis in the  right upper quadrant which is somewhat separate. Discrete transition point is difficult to identify but seems to be in the left anterior abdomen along the ventral abdominal wall. Chronic postoperative changes to the abdominal wall. No pneumoperitoneum identified. No free fluid in the abdomen. Vascular/Lymphatic: Extensive Aortoiliac calcified atherosclerosis. Vascular patency is not evaluated in the absence of IV contrast. Normal caliber abdominal aorta. No lymphadenopathy identified. Reproductive: Stable. Chronic heterogeneity of the right inguinal canal which is nonspecific. Other: Trace free fluid in the pelvis, simple fluid density series 4, image 85. Musculoskeletal: Widespread flowing vertebral endplate osteophytes. Subsequent multilevel lower thoracic interbody ankylosis. Progressed and severe lower lumbar disc and endplate degeneration since 2018. Progressed SI joint ankylosis since 2018. Chronic hip joint degeneration. Chronic left posterior 11th rib fracture. No acute osseous abnormality identified. IMPRESSION: 1. Positive for High-grade Small Bowel Obstruction. Discrete transition point is difficult to identify but seems to be beneath the left ventral abdominal wall. Chronic postoperative changes to the abdomen, favor adhesions. 2. Fluid distended stomach, gastric hiatal hernia, distal esophagus. And a chronic small bowel anastomosis in the right abdomen is also dilated. Large bowel diffusely decompressed. Trace free fluid in the pelvis. No free air or free fluid identified in the abdomen. 3. No superimposed acute traumatic injury identified in the noncontrast abdomen or pelvis. Aortic Atherosclerosis (ICD10-I70.0). Electronically Signed   By: Odessa Fleming M.D.   On: 05/10/2023 06:30   CT Cervical Spine Wo Contrast  Result Date: 05/10/2023 CLINICAL DATA:  72 year old male with hypertension, fall. Pain. EXAM: CT CERVICAL SPINE WITHOUT CONTRAST TECHNIQUE: Multidetector CT imaging of the cervical spine was  performed without intravenous contrast. Multiplanar CT image reconstructions were also generated. RADIATION DOSE REDUCTION: This exam was performed according to the departmental dose-optimization program which includes automated exposure control, adjustment of the mA and/or kV according to patient size and/or use of iterative reconstruction technique. COMPARISON:  Head CT today.  Cervical spine CT 02/12/2020. FINDINGS: Alignment: Chronic straightening of cervical lordosis. Cervicothoracic junction alignment is within normal limits. Bilateral posterior element alignment is within normal limits. Skull base and vertebrae: Visualized skull base is intact. No atlanto-occipital dissociation. C1 and C2 appear chronically degenerated, but intact and aligned. No acute osseous abnormality identified. Soft tissues and spinal canal: No prevertebral fluid or swelling. No visible canal hematoma. Calcified carotid bifurcation atherosclerosis. Negative other visible noncontrast neck soft tissues. Disc levels: Bulky and advanced cervical spine degeneration superimposed on chronic degenerative appearing ankylosis of C5-C6. Bulky disc and endplate disease with mild to moderate cervical spinal stenosis suspected C3-C4 through C5-C6. Probably not significant changed compared to the 2021 CT. Upper chest: Visible upper thoracic levels appear intact. Upper lungs are clear. There is some retained fluid in the upper thoracic esophagus. IMPRESSION: 1. No acute traumatic injury identified in the cervical spine. 2. Bulky cervical spine degeneration superimposed on chronic C5-C6 ankylosis. Chronic mild to moderate cervical spinal stenosis is suspected C3-C4 through C5-C6. Electronically Signed   By: Odessa Fleming M.D.   On: 05/10/2023 06:19   DG Chest Port 1 View  Result Date: 05/10/2023 CLINICAL DATA:  72 year old male with hypertension, fall. Pain. EXAM:  PORTABLE CHEST 1 VIEW COMPARISON:  Portable chest 03/19/2020 and earlier. FINDINGS:  Portable AP supine view at 0503 hours. Lower lung volumes. Moderate chronic hiatal hernia. Other mediastinal contours are within normal limits. Visualized tracheal air column is within normal limits. Crowding of lung markings at both lung bases. No pneumothorax, pulmonary edema, consolidation, or pleural effusion. No acute osseous abnormality identified. IMPRESSION: 1. Low lung volumes with basilar atelectasis. 2. Chronic hiatal hernia. Electronically Signed   By: Odessa Fleming M.D.   On: 05/10/2023 06:15   DG Hip Unilat W or Wo Pelvis 2-3 Views Right  Result Date: 05/10/2023 CLINICAL DATA:  72 year old male with hypertension, fall. Pain. EXAM: DG HIP (WITH OR WITHOUT PELVIS) 2-3V RIGHT COMPARISON:  07/24/2009 pelvis and right hip series. FINDINGS: AP view of the pelvis with AP and cross-table lateral views of the right hip. Femoral heads are normally located. Hip joint spaces remain symmetric. Chronic acetabular degenerative spurring. No acute pelvis fracture identified. Aortoiliac vascular calcifications are parent. Grossly intact proximal left femur. Proximal right femur appears intact. No acute osseous abnormality identified. IMPRESSION: 1. No acute fracture or dislocation identified about the right hip or pelvis. 2. Aortoiliac atherosclerosis. Electronically Signed   By: Odessa Fleming M.D.   On: 05/10/2023 06:14   DG Abd 1 View  Result Date: 05/10/2023 CLINICAL DATA:  72 year old male with three days of abdominal pain. EXAM: ABDOMEN - 1 VIEW COMPARISON:  CT Abdomen and Pelvis 02/14/2017 and earlier. FINDINGS: AP views on 05/09/2023 1000 hours. Evidence of low lung volumes with crowding at both lung bases. Central abdominal conglomeration of gas containing bowel loops appear mildly dilated and abnormal. This is probably small bowel. There is some gas in nondilated colon. No pneumoperitoneum is identified. No acute osseous abnormality identified. IMPRESSION: 1. Abnormal bowel gas pattern in the mid abdomen  suspicious for small bowel obstruction. See CT Abdomen and Pelvis 05/10/2023 reported separately. 2. No pneumoperitoneum identified.  Lung base atelectasis. Electronically Signed   By: Odessa Fleming M.D.   On: 05/10/2023 06:11   CT Head Wo Contrast  Result Date: 05/10/2023 CLINICAL DATA:  72 year old male with hypertension, fall.  Pain. EXAM: CT HEAD WITHOUT CONTRAST TECHNIQUE: Contiguous axial images were obtained from the base of the skull through the vertex without intravenous contrast. RADIATION DOSE REDUCTION: This exam was performed according to the departmental dose-optimization program which includes automated exposure control, adjustment of the mA and/or kV according to patient size and/or use of iterative reconstruction technique. COMPARISON:  Head CT 11/17/2022. FINDINGS: Brain: Small chronic left side subdural fluid collection is mostly low-density and has not significantly changed since May, measuring 4-5 mm at most levels (series 3, image 23). Smaller contralateral low-density right subdural collection is also possible, 2-3 mm, and also stable. No midline shift or significant intracranial mass effect. No ventriculomegaly. Basilar cisterns remain patent. No acute intracranial hemorrhage identified. No cortically based acute infarct identified. Chronic small vessel ischemia including Patchy and confluent bilateral white matter hypodensity, chronic lacunar infarct of the left thalamus. Heterogeneity in the brainstem. Stable gray-white matter differentiation throughout the brain. Vascular: Calcified atherosclerosis at the skull base. No suspicious intracranial vascular hyperdensity. Skull: No fracture identified. Impacted left maxillary tooth incidentally noted. Sinuses/Orbits: Visualized paranasal sinuses and mastoids are stable and well aerated. Other: No orbit or scalp soft tissue injury identified. IMPRESSION: 1. No acute intracranial abnormality or acute traumatic injury identified. 2. Small chronic  bilateral subdural fluid collections are suspected and stable since May (4-5 mm on  the left, 2-3 mm on the right). 3. Advanced chronic small vessel disease appears stable by CT. Electronically Signed   By: Odessa Fleming M.D.   On: 05/10/2023 06:08    Review of Systems  Constitutional:  Positive for malaise/fatigue.  HENT: Negative.    Eyes: Negative.   Respiratory: Negative.    Cardiovascular: Negative.   Gastrointestinal:  Positive for abdominal pain, nausea and vomiting.  Genitourinary: Negative.   Musculoskeletal: Negative.   Skin: Negative.   Neurological: Negative.   Endo/Heme/Allergies: Negative.   Psychiatric/Behavioral: Negative.      PE Blood pressure 119/72, pulse (!) 108, temperature 99.7 F (37.6 C), temperature source Rectal, resp. rate (!) 33, height 5\' 9"  (1.753 m), weight 93.4 kg, SpO2 100%. Constitutional: NAD; conversant; no deformities Eyes: Moist conjunctiva; no lid lag; anicteric; PERRL Neck: Trachea midline; no thyromegaly Lungs: Normal respiratory effort; no tactile fremitus CV: RRR; no palpable thrills; no pitting edema GI: Abd soft, distended, midline scar; no palpable hepatosplenomegaly MSK: unable to assess gait; no clubbing/cyanosis Psychiatric: Appropriate affect; alert and oriented x3 Lymphatic: No palpable cervical or axillary lymphadenopathy Skin: No major subcutaneous nodules. Warm and dry   Assessment/Plan: 72 yo male with concern for bowel obstruction, acute renal failure, leukocytosis. CT scan showing fecalization of the small intestine with transition point. No free air or fluid. -NG tube insertion -bowel rest -broad spectrum abx  I reviewed last 24 h vitals and pain scores, last 48 h intake and output, last 24 h labs and trends, and last 24 h imaging results.  This care required high  level of medical decision making.   De Blanch Samarah Hogle 05/10/2023, 7:04 AM

## 2023-05-10 NOTE — Progress Notes (Signed)
Patient ID: [name], male   DOB: 09/02/1950, 72 y.o.   MRN: [id number] Lactate better, ng with a full cannister.  States still has ab pain.  He is tender and I am concerned about him.  Heart rate better bp normal. I discussed surgery with him.  He wants to consider options and discuss with brother. At minimum he would need to vastly improve by tomorrow to avoid surgery and may still yet require this overnight.

## 2023-05-10 NOTE — ED Provider Notes (Signed)
[place] EMERGENCY DEPARTMENT AT [facility] Provider Note   CSN: [id number] Arrival date & time: 05/10/23  0434     History  Chief Complaint  Patient presents with   Fall   Hypotension    [name] is a 72 y.o. male.  The history is provided by the patient, the EMS personnel and medical records.  Fall  [name] is a 72 y.o. male who presents to the Emergency Department complaining of fall.  He presents to the emergency department by EMS from home for evaluation following a fall.  He states that he has been feeling poorly for the last 3 days with generalized bodyaches, abdominal pain, vomiting green emesis.  No constipation or diarrhea.  He has been having green stools.  No reported fevers.  No chest pain or difficulty breathing.  EMS report blood pressure of 80/40 and was started on LR.  His blood pressure then dropped to 60s over 40s.  He states that he has been experiencing pain in his right hip for 3 days and right side of his back prior to the fall.  He does have a history of hypertension, prostate cancer.  He continues to take his blood pressure medicine.     Home Medications Prior to Admission medications   Medication Sig Start Date End Date Taking? Authorizing Provider  ketoconazole (NIZORAL) 2 % cream Apply topically. 05/03/23  Yes [provider]  ondansetron (ZOFRAN) 4 MG tablet Take 4 mg by mouth every 8 (eight) hours as needed. 05/09/23  Yes [provider]  rosuvastatin (CRESTOR) 5 MG tablet SMARTSIG:1 Tablet(s) By Mouth Every Evening 04/20/23  Yes [provider]  Vitamin D, Ergocalciferol, 50000 units CAPS Take 1 capsule by mouth once a week. 04/20/23  Yes [provider]  acetaminophen (TYLENOL) 325 MG tablet Take 2 tablets (650 mg total) by mouth every 6 (six) hours as needed for moderate pain or headache. 12/27/20   Nelson Chimes, Ankit C, MD  buPROPion (WELLBUTRIN SR) 200 MG 12 hr tablet TAKE 1 TABLET BY  MOUTH DAILY 02/19/22   Deeann Saint, MD  cyclobenzaprine (FLEXERIL) 5 MG tablet TAKE 1 TABLET BY MOUTH TWICE A DAY AS NEEDED FOR MUSCLE SPASMS 10/27/20   Deeann Saint, MD  FEROSUL 325 (65 Fe) MG tablet TAKE 1 TABLET BY MOUTH THREE TIMES A DAY WITH MEALS 02/19/22   Deeann Saint, MD  furosemide (LASIX) 20 MG tablet Take 1 tablet (20 mg total) by mouth 2 (two) times daily. 09/28/21   Deeann Saint, MD  gabapentin (NEURONTIN) 300 MG capsule Take by mouth. 01/07/21   [provider]  hydrALAZINE (APRESOLINE) 25 MG tablet TAKE 1 TABLET BY MOUTH THREE TIMES A DAY 02/19/22   Deeann Saint, MD  HYDROcodone-acetaminophen (NORCO/VICODIN) 5-325 MG tablet Take 2 tablets by mouth every 6 (six) hours as needed. 04/07/21   Burchette, Elberta Fortis, MD  ibuprofen (ADVIL) 600 MG tablet Take by mouth. 01/07/21   [provider]  lisinopril (ZESTRIL) 40 MG tablet Take 1 tablet by mouth daily.    [provider]  magnesium oxide (MAG-OX) 400 MG tablet Take 400 mg by mouth daily.    [provider]  Multiple Vitamin (MULTIVITAMINS PO) Take 1 tablet by mouth daily. 06/04/20   [provider]  mupirocin ointment (BACTROBAN) 2 % APPLY THIN LAYER TO AFFECTED AREA AS DIRECTED BY YOUR MEDICAL PROVIDER APPLY TO CLEAN DRY SKIN 02/27/21   [provider]  pantoprazole (PROTONIX) 40 MG tablet TAKE 1 TABLET BY MOUTH TWICE A DAY BEFORE MEALS 03/03/22   Deeann Saint, MD  potassium chloride SA (KLOR-CON M) 20 MEQ tablet TAKE 1 TABLET BY MOUTH DAILY 12/08/21   Deeann Saint, MD  predniSONE (DELTASONE) 10 MG tablet Take 5 tabs on day 1, 4 tabs on day 2, 3 tabs on day 3, 2 tabs on day 4, 1 tab on day 5. 06/18/21   Deeann Saint, MD  QUEtiapine (SEROQUEL) 200 MG tablet Take 1 tablet (200 mg total) by mouth at bedtime. 02/12/22   Deeann Saint, MD  senna-docusate (SENOKOT-S) 8.6-50 MG tablet Take 1 tablet by mouth 2 (two) times daily. 12/27/20   Amin, Ankit C, MD  sertraline  (ZOLOFT) 100 MG tablet Take 1 tablet (100 mg total) by mouth daily. 02/05/22   Ranelle Oyster, MD  silver sulfADIAZINE (SILVADENE) 1 % cream Apply 1 application topically daily. 04/07/21   Burchette, Elberta Fortis, MD  sodium chloride (OCEAN) 0.65 % SOLN nasal spray Place 1 spray into both nostrils as needed for congestion. 08/18/18   Love, Evlyn Kanner, PA-C  spironolactone (ALDACTONE) 25 MG tablet Take 1 tablet (25 mg total) by mouth daily. 12/03/21 11/28/22  Maisie Fus, MD  thiamine 100 MG tablet Take by mouth. 01/07/21   [provider]      Allergies    Sudafed [pseudoephedrine]    Review of Systems   Review of Systems  All other systems reviewed and are negative.   Physical Exam Updated Vital Signs BP 119/72   Pulse (!) 108   Temp 99.7 F (37.6 C) (Rectal)   Resp (!) 33   Ht 5\' 9"  (1.753 m)   Wt 93.4 kg   SpO2 100%   BMI 30.41 kg/m  Physical Exam Vitals and nursing note reviewed.  Constitutional:      General: He is in acute distress.     Appearance: He is well-developed. He is ill-appearing.  HENT:     Head: Normocephalic and atraumatic.  Cardiovascular:     Rate and Rhythm: Regular rhythm. Tachycardia present.     Heart sounds: No murmur heard. Pulmonary:     Effort: Pulmonary effort is normal. No respiratory distress.     Breath sounds: Normal breath sounds.  Abdominal:     Palpations: Abdomen is soft.     Tenderness: There is abdominal tenderness. There is no guarding or rebound.     Comments: Generalized abdominal tenderness  Genitourinary:    Comments: Normal rectal tone, small amount of brown stool in rectal vault Musculoskeletal:        General: No swelling or tenderness.  Skin:    General: Skin is warm and dry.  Neurological:     Mental Status: He is alert.     Comments: Oriented to hospital.  Global weakness.  Slow to answer questions.  Psychiatric:        Behavior: Behavior normal.     ED Results / Procedures / Treatments   Labs (all labs  ordered are listed, but only abnormal results are displayed) Labs Reviewed  COMPREHENSIVE METABOLIC PANEL - Abnormal; Notable for the following components:      Result Value   Potassium 3.4 (*)    CO2 18 (*)    Glucose, Bld 127 (*)    BUN 25 (*)    Creatinine, Ser 3.46 (*)    Albumin 3.4 (*)    GFR, Estimated 18 (*)  Anion gap 22 (*)    All other components within normal limits  CBC WITH DIFFERENTIAL/PLATELET - Abnormal; Notable for the following components:   WBC 14.9 (*)    Hemoglobin 7.3 (*)    HCT 29.1 (*)    MCV 68.1 (*)    MCH 17.1 (*)    MCHC 25.1 (*)    RDW 19.9 (*)    Platelets 479 (*)    nRBC 0.5 (*)    Neutro Abs 13.6 (*)    Lymphs Abs 0.2 (*)    Abs Immature Granulocytes 0.10 (*)    All other components within normal limits  I-STAT CHEM 8, ED - Abnormal; Notable for the following components:   Potassium 3.3 (*)    BUN 24 (*)    Creatinine, Ser 3.60 (*)    Glucose, Bld 126 (*)    Calcium, Ion 1.13 (*)    TCO2 19 (*)    Hemoglobin 10.2 (*)    HCT 30.0 (*)    All other components within normal limits  I-STAT CG4 LACTIC ACID, ED - Abnormal; Notable for the following components:   Lactic Acid, Venous 11.9 (*)    All other components within normal limits  CBG MONITORING, ED - Abnormal; Notable for the following components:   Glucose-Capillary 137 (*)    All other components within normal limits  I-STAT CG4 LACTIC ACID, ED - Abnormal; Notable for the following components:   Lactic Acid, Venous 7.0 (*)    All other components within normal limits  LIPASE, BLOOD  D-DIMER, QUANTITATIVE  URINALYSIS, W/ REFLEX TO CULTURE (INFECTION SUSPECTED)  TYPE AND SCREEN    EKG EKG Interpretation Date/Time:  Tuesday May 10 2023 04:40:21 EDT Ventricular Rate:  120 PR Interval:  117 QRS Duration:  95 QT Interval:  393 QTC Calculation: 556 R Axis:   -28  Text Interpretation: Sinus or ectopic atrial tachycardia Probable left atrial enlargement Abnormal R-wave  progression, early transition LVH with secondary repolarization abnormality Prolonged QT interval Confirmed by Tilden Fossa [phone]) on 05/10/2023 5:00:33 AM  Radiology CT ABDOMEN PELVIS WO CONTRAST  Result Date: 05/10/2023 CLINICAL DATA:  72 year old male with hypertension, fall. Pre-existing abdominal pain for several days. EXAM: CT ABDOMEN AND PELVIS WITHOUT CONTRAST TECHNIQUE: Multidetector CT imaging of the abdomen and pelvis was performed following the standard protocol without IV contrast. RADIATION DOSE REDUCTION: This exam was performed according to the departmental dose-optimization program which includes automated exposure control, adjustment of the mA and/or kV according to patient size and/or use of iterative reconstruction technique. COMPARISON:  Abdominal radiographs 05/09/2023. CT Abdomen and Pelvis 02/14/2017. FINDINGS: Lower chest: Calcified coronary artery atherosclerosis. Normal heart size. No pericardial or pleural effusion. But fluid distension of the distal thoracic esophagus and moderate size chronic hiatal hernia. Mild lower lobe atelectasis. Hepatobiliary: Negative noncontrast liver and gallbladder. Pancreas: Negative. Spleen: Negative. Adrenals/Urinary Tract: Stable since 2018, negative. Right renal vascular calcification. No convincing nephrolithiasis. Stomach/Bowel: Redundant but decompressed large bowel throughout the abdomen and pelvis. Decompressed terminal ileum and ileocecal valve on coronal image 91. Decompressed distal small bowel loops throughout the right lower quadrant. Fluid distended stomach including hiatal hernia. Fluid distended duodenum and proximal jejunum. Air-fluid containing jejunal loops are dilated up to 5 cm diameter, mostly in the left abdomen. There is a dilated chronic small bowel anastomosis in the right upper quadrant which is somewhat separate. Discrete transition point is difficult to identify but seems to be in the left anterior abdomen along the  ventral abdominal  wall. Chronic postoperative changes to the abdominal wall. No pneumoperitoneum identified. No free fluid in the abdomen. Vascular/Lymphatic: Extensive Aortoiliac calcified atherosclerosis. Vascular patency is not evaluated in the absence of IV contrast. Normal caliber abdominal aorta. No lymphadenopathy identified. Reproductive: Stable. Chronic heterogeneity of the right inguinal canal which is nonspecific. Other: Trace free fluid in the pelvis, simple fluid density series 4, image 85. Musculoskeletal: Widespread flowing vertebral endplate osteophytes. Subsequent multilevel lower thoracic interbody ankylosis. Progressed and severe lower lumbar disc and endplate degeneration since 2018. Progressed SI joint ankylosis since 2018. Chronic hip joint degeneration. Chronic left posterior 11th rib fracture. No acute osseous abnormality identified. IMPRESSION: 1. Positive for High-grade Small Bowel Obstruction. Discrete transition point is difficult to identify but seems to be beneath the left ventral abdominal wall. Chronic postoperative changes to the abdomen, favor adhesions. 2. Fluid distended stomach, gastric hiatal hernia, distal esophagus. And a chronic small bowel anastomosis in the right abdomen is also dilated. Large bowel diffusely decompressed. Trace free fluid in the pelvis. No free air or free fluid identified in the abdomen. 3. No superimposed acute traumatic injury identified in the noncontrast abdomen or pelvis. Aortic Atherosclerosis (ICD10-I70.0). Electronically Signed   By: Odessa Fleming M.D.   On: 05/10/2023 06:30   CT Cervical Spine Wo Contrast  Result Date: 05/10/2023 CLINICAL DATA:  72 year old male with hypertension, fall. Pain. EXAM: CT CERVICAL SPINE WITHOUT CONTRAST TECHNIQUE: Multidetector CT imaging of the cervical spine was performed without intravenous contrast. Multiplanar CT image reconstructions were also generated. RADIATION DOSE REDUCTION: This exam was performed  according to the departmental dose-optimization program which includes automated exposure control, adjustment of the mA and/or kV according to patient size and/or use of iterative reconstruction technique. COMPARISON:  Head CT today.  Cervical spine CT 02/12/2020. FINDINGS: Alignment: Chronic straightening of cervical lordosis. Cervicothoracic junction alignment is within normal limits. Bilateral posterior element alignment is within normal limits. Skull base and vertebrae: Visualized skull base is intact. No atlanto-occipital dissociation. C1 and C2 appear chronically degenerated, but intact and aligned. No acute osseous abnormality identified. Soft tissues and spinal canal: No prevertebral fluid or swelling. No visible canal hematoma. Calcified carotid bifurcation atherosclerosis. Negative other visible noncontrast neck soft tissues. Disc levels: Bulky and advanced cervical spine degeneration superimposed on chronic degenerative appearing ankylosis of C5-C6. Bulky disc and endplate disease with mild to moderate cervical spinal stenosis suspected C3-C4 through C5-C6. Probably not significant changed compared to the 2021 CT. Upper chest: Visible upper thoracic levels appear intact. Upper lungs are clear. There is some retained fluid in the upper thoracic esophagus. IMPRESSION: 1. No acute traumatic injury identified in the cervical spine. 2. Bulky cervical spine degeneration superimposed on chronic C5-C6 ankylosis. Chronic mild to moderate cervical spinal stenosis is suspected C3-C4 through C5-C6. Electronically Signed   By: Odessa Fleming M.D.   On: 05/10/2023 06:19   DG Chest Port 1 View  Result Date: 05/10/2023 CLINICAL DATA:  72 year old male with hypertension, fall. Pain. EXAM: PORTABLE CHEST 1 VIEW COMPARISON:  Portable chest 03/19/2020 and earlier. FINDINGS: Portable AP supine view at 0503 hours. Lower lung volumes. Moderate chronic hiatal hernia. Other mediastinal contours are within normal limits. Visualized  tracheal air column is within normal limits. Crowding of lung markings at both lung bases. No pneumothorax, pulmonary edema, consolidation, or pleural effusion. No acute osseous abnormality identified. IMPRESSION: 1. Low lung volumes with basilar atelectasis. 2. Chronic hiatal hernia. Electronically Signed   By: Odessa Fleming M.D.   On: 05/10/2023 06:15  DG Hip Unilat W or Wo Pelvis 2-3 Views Right  Result Date: 05/10/2023 CLINICAL DATA:  72 year old male with hypertension, fall. Pain. EXAM: DG HIP (WITH OR WITHOUT PELVIS) 2-3V RIGHT COMPARISON:  07/24/2009 pelvis and right hip series. FINDINGS: AP view of the pelvis with AP and cross-table lateral views of the right hip. Femoral heads are normally located. Hip joint spaces remain symmetric. Chronic acetabular degenerative spurring. No acute pelvis fracture identified. Aortoiliac vascular calcifications are parent. Grossly intact proximal left femur. Proximal right femur appears intact. No acute osseous abnormality identified. IMPRESSION: 1. No acute fracture or dislocation identified about the right hip or pelvis. 2. Aortoiliac atherosclerosis. Electronically Signed   By: Odessa Fleming M.D.   On: 05/10/2023 06:14   DG Abd 1 View  Result Date: 05/10/2023 CLINICAL DATA:  72 year old male with three days of abdominal pain. EXAM: ABDOMEN - 1 VIEW COMPARISON:  CT Abdomen and Pelvis 02/14/2017 and earlier. FINDINGS: AP views on 05/09/2023 1000 hours. Evidence of low lung volumes with crowding at both lung bases. Central abdominal conglomeration of gas containing bowel loops appear mildly dilated and abnormal. This is probably small bowel. There is some gas in nondilated colon. No pneumoperitoneum is identified. No acute osseous abnormality identified. IMPRESSION: 1. Abnormal bowel gas pattern in the mid abdomen suspicious for small bowel obstruction. See CT Abdomen and Pelvis 05/10/2023 reported separately. 2. No pneumoperitoneum identified.  Lung base atelectasis.  Electronically Signed   By: Odessa Fleming M.D.   On: 05/10/2023 06:11   CT Head Wo Contrast  Result Date: 05/10/2023 CLINICAL DATA:  72 year old male with hypertension, fall.  Pain. EXAM: CT HEAD WITHOUT CONTRAST TECHNIQUE: Contiguous axial images were obtained from the base of the skull through the vertex without intravenous contrast. RADIATION DOSE REDUCTION: This exam was performed according to the departmental dose-optimization program which includes automated exposure control, adjustment of the mA and/or kV according to patient size and/or use of iterative reconstruction technique. COMPARISON:  Head CT 11/17/2022. FINDINGS: Brain: Small chronic left side subdural fluid collection is mostly low-density and has not significantly changed since May, measuring 4-5 mm at most levels (series 3, image 23). Smaller contralateral low-density right subdural collection is also possible, 2-3 mm, and also stable. No midline shift or significant intracranial mass effect. No ventriculomegaly. Basilar cisterns remain patent. No acute intracranial hemorrhage identified. No cortically based acute infarct identified. Chronic small vessel ischemia including Patchy and confluent bilateral white matter hypodensity, chronic lacunar infarct of the left thalamus. Heterogeneity in the brainstem. Stable gray-white matter differentiation throughout the brain. Vascular: Calcified atherosclerosis at the skull base. No suspicious intracranial vascular hyperdensity. Skull: No fracture identified. Impacted left maxillary tooth incidentally noted. Sinuses/Orbits: Visualized paranasal sinuses and mastoids are stable and well aerated. Other: No orbit or scalp soft tissue injury identified. IMPRESSION: 1. No acute intracranial abnormality or acute traumatic injury identified. 2. Small chronic bilateral subdural fluid collections are suspected and stable since May (4-5 mm on the left, 2-3 mm on the right). 3. Advanced chronic small vessel disease  appears stable by CT. Electronically Signed   By: Odessa Fleming M.D.   On: 05/10/2023 06:08    Procedures Procedures   CRITICAL CARE Performed by: Tilden Fossa   Total critical care time: 45 minutes  Critical care time was exclusive of separately billable procedures and treating other patients.  Critical care was necessary to treat or prevent imminent or life-threatening deterioration.  Critical care was time spent personally by me on the following  activities: development of treatment plan with patient and/or surrogate as well as nursing, discussions with consultants, evaluation of patient's response to treatment, examination of patient, obtaining history from patient or surrogate, ordering and performing treatments and interventions, ordering and review of laboratory studies, ordering and review of radiographic studies, pulse oximetry and re-evaluation of patient's condition.  Medications Ordered in ED Medications  sodium chloride 0.9 % bolus 1,000 mL (0 mLs Intravenous Stopped 05/10/23 0646)  sodium chloride 0.9 % bolus 500 mL (0 mLs Intravenous Stopped 05/10/23 0722)  cefTRIAXone (ROCEPHIN) 2 g in sodium chloride 0.9 % 100 mL IVPB (0 g Intravenous Stopped 05/10/23 0615)  metroNIDAZOLE (FLAGYL) IVPB 500 mg (0 mg Intravenous Stopped 05/10/23 0646)    ED Course/ Medical Decision Making/ A&P                                 Medical Decision Making Amount and/or Complexity of Data Reviewed Labs: ordered. Radiology: ordered.  Risk Prescription drug management. Decision regarding hospitalization.   Patient brought in for hypotension, fall.  He is ill-appearing on evaluation with tachycardia, mild lethargy, abdominal tenderness.  Labs significant for elevated lactic acid, elevation in creatinine when compared to baseline.  He was treated with IV fluids with resolution of his hypotension.  CT abdomen pelvis was obtained, which is concerning for bowel obstruction and an NG tube was placed.   Discussed with Dr. Sheliah Hatch with general surgery, who evaluated the patient in consult.  I-STAT Chem-8 with a hemoglobin of 10, CBC with hemoglobin of 7.3.  Patient does not have overt GI bleeding or evidence of active bleeding on examination, type and screen was ordered.  Plan to recheck hemoglobin.  Discussed with patient findings of studies and recommendation for admission for ongoing care and he is in agreement with treatment plan.  Hospitalist consulted for admission for ongoing care.        Final Clinical Impression(s) / ED Diagnoses Final diagnoses:  Acute renal failure, unspecified acute renal failure type (HCC)  SBO (small bowel obstruction) (HCC)    Rx / DC Orders ED Discharge Orders     None         Tilden Fossa, MD 05/10/23 0740

## 2023-05-10 NOTE — H&P (Signed)
History and Physical    Patient: [name] UJW:[id number] DOB: March 10, 1951 DOA: 05/10/2023 DOS: the patient was seen and examined on 05/10/2023 PCP: Loura Back, NP  Patient coming from: Home via EMS  Chief Complaint:  Chief Complaint  Patient presents with   Fall   Hypotension   HPI: [name] is a 72 y.o. male with medical history significant of hyperlipidemia, CVA, hepatitis C, iron deficiency anemia, small bowel obstruction s/p 2001 resection of bowel, GI bleed, hiatal hernia, and GERD who presents after having a fall at home.  Denies any loss of consciousness or trauma to his head.  At baseline patient lives alone and gets around with the use of a wheelchair.  He reports that he is unable to walk due to prior strokes.  For the last 3 days he has not been feeling well.  Initially noted left lower quadrant abdominal pain, but is now generalized and severe.  Notes associated symptoms of nausea, vomiting, and right hip/lower back pain.  Emesis was noted to be green in color without signs of blood present.  He has not been able to keep any significant amount of food or liquids down during this time.  His last bowel movement was yesterday and reported that it was loose.  Denies seeing any blood in his stools.  His right hip pain and back pain has started prior to his fall.  He has not had any significant chest pain or trouble breathing.  Most of his care is through the [facility].  He does still smoke half a pack of cigarettes per day on average but denies having any use.  Review of history notes patient had prior surgery for ruptured appendix in 1969, abdominal wall injury that required multiple surgeries while in the [employer], and small bowel obstruction resulting in renal resection back in 2001.  On EMS arrival patient blood pressure was noted to be 80/40 with glucose 294.  Patient was given bolus of 500 mL of IV fluid.  Initial O2 saturations noted to be 92% on room air and he was  and placed on 4 L of nasal cannula oxygen  In the emergency department patient was noted to be afebrile with heart rates 107 112, respirations 17-37, blood pressures as low as 83/53 with improvement up to 121/59 with IV fluids, and O2 saturations currently maintained on 2 L of nasal cannula oxygen.  Labs significant for WBC 14.9, hemoglobin 7.3, platelets 479, lactic acid 11.9-> 7, potassium 3.4, CO2 18, BUN 25, creatinine 3.46, glucose 127,  anion gap 22, and D-dimer <0.27.  Chest x-ray noted low lung volumes with atelectasis and chronic hiatal hernia.  CT scan of the head and cervical spine did not note any acute intracranial or acute traumatic injury, but did note small chronic bilateral subdural fluid collections stable in size when compared to imaging from May.  CT scan of the abdomen pelvis without contrast was positive for high-grade small bowel obstruction with transition point possibly beneath the left ventral abdominal wall with postoperative changes to suggest adhesions.  Stools were noted to be green in color without concern for blood present.  Patient had been given 1.5 L of normal saline IV fluids, metronidazole, and Rocephin IV.  Review of Systems: As mentioned in the history of present illness. All other systems reviewed and are negative. Past Medical History:  Diagnosis Date   Arthritis    Sheria Lang ulcer, chronic 01/11/2018   Chronic back pain    GERD (gastroesophageal  reflux disease)    uses Baking Soda   GIB (gastrointestinal bleeding) 2012   Glaucoma    right eye   Headache    occasionally   Hepatitis C    Hiatal hernia with gastroesophageal reflux disease and esophagitis 01/05/2018   History of blood transfusion    no abnormal reaction noted   History of gout    doesn't take any meds   Hyperlipidemia    not on any meds   Insomnia    takes Ambien nightly   Ischemic colitis (HCC) 2012   Joint pain    Nocturia    Numbness    both legs occasionally   PONV (postoperative  nausea and vomiting)    Prostate cancer (HCC)    Shortness of breath dyspnea    rarely but when notices he can be lying/sitting/exertion.Dr.Hochrein is aware per pt   Stroke [facility]) 2016    Urinary frequency    Urinary urgency    Past Surgical History:  Procedure Laterality Date   APPENDECTOMY     BIOPSY  01/11/2018   Procedure: BIOPSY;  Surgeon: Iva Boop, MD;  Location: [facility] ENDOSCOPY;  Service: Endoscopy;;   COLONOSCOPY     COLONOSCOPY N/A 10/26/2015   Procedure: COLONOSCOPY;  Surgeon: Sherrilyn Rist, MD;  Location: [facility] ENDOSCOPY;  Service: Endoscopy;  Laterality: N/A;   ESOPHAGOGASTRODUODENOSCOPY     ESOPHAGOGASTRODUODENOSCOPY N/A 10/26/2015   Procedure: ESOPHAGOGASTRODUODENOSCOPY (EGD);  Surgeon: Sherrilyn Rist, MD;  Location: [facility] ENDOSCOPY;  Service: Endoscopy;  Laterality: N/A;   ESOPHAGOGASTRODUODENOSCOPY (EGD) WITH PROPOFOL N/A 01/11/2018   Procedure: ESOPHAGOGASTRODUODENOSCOPY (EGD) WITH PROPOFOL;  Surgeon: Iva Boop, MD;  Location: [facility] ENDOSCOPY;  Service: Endoscopy;  Laterality: N/A;   ESOPHAGOGASTRODUODENOSCOPY (EGD) WITH PROPOFOL N/A 12/26/2020   Procedure: ESOPHAGOGASTRODUODENOSCOPY (EGD) WITH PROPOFOL;  Surgeon: Meryl Dare, MD;  Location: [facility] ENDOSCOPY;  Service: Endoscopy;  Laterality: N/A;   HOT HEMOSTASIS N/A 12/26/2020   Procedure: HOT HEMOSTASIS (ARGON PLASMA COAGULATION/BICAP);  Surgeon: Meryl Dare, MD;  Location: [facility] ENDOSCOPY;  Service: Endoscopy;  Laterality: N/A;   INGUINAL HERNIA REPAIR Right 11/04/2014   Procedure: RIGHT INGUINAL HERNIA REPAIR WITH MESH;  Surgeon: Avel Peace, MD;  Location: [facility] OR;  Service: General;  Laterality: Right;   MASS EXCISION N/A 11/04/2014   Procedure: REMOVAL OF RIGHT GROIN SOFT TISSUE MASS;  Surgeon: Avel Peace, MD;  Location: [facility] OR;  Service: General;  Laterality: N/A;   Multiple abdominal surgeries     total of 13   PROSTATECTOMY     SMALL INTESTINE SURGERY     Social History:  reports that he has been  smoking cigarettes. He has a 25 pack-year smoking history. He has never used smokeless tobacco. He reports current alcohol use. He reports that he does not currently use drugs after having used the following drugs: Marijuana. Frequency: 5.00 times per week.  Allergies  Allergen Reactions   Sudafed [Pseudoephedrine] Palpitations and Other (See Comments)    Heart "races"    Family History  Problem Relation Age of Onset   Alzheimer's disease Father    Cancer Mother        Lymph node   Heart failure Brother 59       Transplant 13 years ago   CAD Brother    Heart disease Sister 103       MI    Prior to Admission medications   Medication Sig Start Date End Date Taking? Authorizing Provider  acetaminophen (TYLENOL) 325 MG  tablet Take 2 tablets (650 mg total) by mouth every 6 (six) hours as needed for moderate pain or headache. 12/27/20   Amin, Ankit C, MD  buPROPion (WELLBUTRIN SR) 200 MG 12 hr tablet TAKE 1 TABLET BY MOUTH DAILY 02/19/22   Deeann Saint, MD  cyclobenzaprine (FLEXERIL) 5 MG tablet TAKE 1 TABLET BY MOUTH TWICE A DAY AS NEEDED FOR MUSCLE SPASMS 10/27/20   Deeann Saint, MD  FEROSUL 325 (65 Fe) MG tablet TAKE 1 TABLET BY MOUTH THREE TIMES A DAY WITH MEALS 02/19/22   Deeann Saint, MD  furosemide (LASIX) 20 MG tablet Take 1 tablet (20 mg total) by mouth 2 (two) times daily. 09/28/21   Deeann Saint, MD  gabapentin (NEURONTIN) 300 MG capsule Take by mouth. 01/07/21   [provider]  hydrALAZINE (APRESOLINE) 25 MG tablet TAKE 1 TABLET BY MOUTH THREE TIMES A DAY 02/19/22   Deeann Saint, MD  HYDROcodone-acetaminophen (NORCO/VICODIN) 5-325 MG tablet Take 2 tablets by mouth every 6 (six) hours as needed. 04/07/21   Burchette, Elberta Fortis, MD  ibuprofen (ADVIL) 600 MG tablet Take by mouth. 01/07/21   [provider]  lisinopril (ZESTRIL) 40 MG tablet Take 1 tablet by mouth daily.    [provider]  magnesium oxide (MAG-OX) 400 MG tablet Take 400 mg by mouth  daily.    [provider]  Multiple Vitamin (MULTIVITAMINS PO) Take 1 tablet by mouth daily. 06/04/20   [provider]  mupirocin ointment (BACTROBAN) 2 % APPLY THIN LAYER TO AFFECTED AREA AS DIRECTED BY YOUR MEDICAL PROVIDER APPLY TO CLEAN DRY SKIN 02/27/21   [provider]  pantoprazole (PROTONIX) 40 MG tablet TAKE 1 TABLET BY MOUTH TWICE A DAY BEFORE MEALS 03/03/22   Deeann Saint, MD  potassium chloride SA (KLOR-CON M) 20 MEQ tablet TAKE 1 TABLET BY MOUTH DAILY 12/08/21   Deeann Saint, MD  predniSONE (DELTASONE) 10 MG tablet Take 5 tabs on day 1, 4 tabs on day 2, 3 tabs on day 3, 2 tabs on day 4, 1 tab on day 5. 06/18/21   Deeann Saint, MD  QUEtiapine (SEROQUEL) 200 MG tablet Take 1 tablet (200 mg total) by mouth at bedtime. 02/12/22   Deeann Saint, MD  senna-docusate (SENOKOT-S) 8.6-50 MG tablet Take 1 tablet by mouth 2 (two) times daily. 12/27/20   Amin, Ankit C, MD  sertraline (ZOLOFT) 100 MG tablet Take 1 tablet (100 mg total) by mouth daily. 02/05/22   Ranelle Oyster, MD  silver sulfADIAZINE (SILVADENE) 1 % cream Apply 1 application topically daily. 04/07/21   Burchette, Elberta Fortis, MD  sodium chloride (OCEAN) 0.65 % SOLN nasal spray Place 1 spray into both nostrils as needed for congestion. 08/18/18   Love, Evlyn Kanner, PA-C  spironolactone (ALDACTONE) 25 MG tablet Take 1 tablet (25 mg total) by mouth daily. 12/03/21 11/28/22  Maisie Fus, MD  thiamine 100 MG tablet Take by mouth. 01/07/21   [provider]    Physical Exam: Vitals:   05/10/23 0515 05/10/23 0530 05/10/23 0545 05/10/23 0600  BP: (!) 121/59 104/66 (!) 83/53 119/72  Pulse: (!) 112 (!) 107 (!) 110 (!) 108  Resp: (!) 23 17 (!) 30 (!) 33  Temp:      TempSrc:      SpO2: 100% 99% 100% 100%  Weight:      Height:         Constitutional: Elderly male who appears to  be in significant discomfort Eyes: PERRL, lids and conjunctivae normal ENMT: Mucous membranes are dry.  NGT in place  with green stomach contents present.  Edentulous. Neck: normal, supple, no masses, no thyromegaly Respiratory: clear to auscultation bilaterally, no wheezing, no crackles. Normal respiratory effort. No accessory muscle use.  Cardiovascular: Regular rate and rhythm, no murmurs / rubs / gallops. No extremity edema.   Abdomen: Significant left lower quadrant abdominal tenderness.  Bowel sounds decreased. Musculoskeletal: no clubbing / cyanosis. No joint deformity upper and lower extremities.  Skin: no rashes, lesions, ulcers. No induration.  Poor skin turgor. Neurologic: CN 2-12 grossly intact.  Strength 4/5 in both upper extremities.  Strength 3/5 in right lower extremity and 2-3 in the left lower extremity Psychiatric: Normal judgment and insight. Alert and oriented x 3. Normal mood.   Data Reviewed:  EKG reveals sinus tachycardia 122 bpm with QTc 556.  Reviewed labs, imaging, and pertinent records as documented.  Assessment and Plan:  Small bowel obstruction Presents with complaints of abdominal pain with nausea and vomiting over the last 3 days.  CT scan of the abdomen pelvis noted concern for high-grade bowel obstruction with transition point possibly but need the left ventral abdominal wall with postoperative changes to suggest adhesion as the likely cause.  Patient with prior history of ruptured appendix surgery, abdominal wall injury requiring multiple surgeries, and small bowel obstruction can 2001 requiring multiple surgeries.  General surgery have been consulted placed orders for NG tube placement. -Admit to a progressive bed -Small bowel order set utilized -Aspiration precautions with elevation head of the bed -N.p.o. -Continue NGT to suction -Check serial abdominal x-rays -IV fluids  -Appreciate general surgery consultative services we will follow-up for any further recommendations  SIRS Lactic acidosis Acute.  Patient was noted to be tachycardic, tachypneic, and have elevated  white blood cell count of 14.9 on admission with initial lactic acid elevated up to 11.9.  Unclear if symptoms are in response to small bowel obstruction with nausea vomiting or patient has underlying infection.  He was placed on empiric antibiotics of Rocephin and metronidazole.  Lactic acid noted to have improved down to 7 after initial IV fluid bolus. -Check respiratory virus panel -Continue to trend lactic acid levels -Continue Rocephin and metronidazole  Transient hypotension Acute.  Blood pressures noted to be as low as 83/53 on admission.  Blood pressures responded with IV fluids.   -Goal MAP greater than 65 -Hold home blood pressure regimen.  Determine when medically appropriate to resume home regimen  Acute kidney injury On admission creatinine noted to be elevated at 3.46 with BUN 25.  Baseline creatinine previously had been within normal limits earlier this year at around 1.  Patient had been given a bolus of IV fluids. -Hold possible nephrotoxic agents -Check CK -Check urinalysis -Check urine sodium, creatinine, and urea -Continue IV fluids  Iron deficiency anemia Acute on chronic.  Hemoglobin 7.3 with low MCV and MCH.  Prior iron studies dating back to 2022 have been low.  Stool was reported to be green in color with no signs signs of blood per the ED provider.  Patient had been typed and screened for possible need of blood products.. -Check anemia panel -Continue to monitor and transfuse blood products as needed for hemoglobins less than 7 g/dL or patient symptomatic  Fall at home Right hip pain Patient presented after having a fall at home.  Noted complaints of right hip pain prior to the fall.  No traumatic  injury noted on CT imaging. -Follow-up CK -Will need PT/OT once stable  Hypokalemia Acute.  Initial potassium noted to be 3.4. -Give 20 mEq of potassium chloride IV  Thrombocytosis Chronic.  Platelet count elevated at 479, but appears to be around patient  baseline. -Continue to monitor  History of small bowel obstruction  Records note prior history of small bowel obstruction back in 2001 which resulted in patient having multiple abdominal surgeries and resection of bowel.  Tobacco abuse Patient reports smoking half a pack of cigarettes per day on average. -Nicotine patch offered  GERD Hiatal hernia -Protonix IV  DVT prophylaxis: SCDs Advance Care Planning:   Code Status: Full Code   Consults: General Surgery  Family Communication:   Severity of Illness: The appropriate patient status for this patient is INPATIENT. Inpatient status is judged to be reasonable and necessary in order to provide the required intensity of service to ensure the patient's safety. The patient's presenting symptoms, physical exam findings, and initial radiographic and laboratory data in the context of their chronic comorbidities is felt to place them at high risk for further clinical deterioration. Furthermore, it is not anticipated that the patient will be medically stable for discharge from the hospital within 2 midnights of admission.   * I certify that at the point of admission it is my clinical judgment that the patient will require inpatient hospital care spanning beyond 2 midnights from the point of admission due to high intensity of service, high risk for further deterioration and high frequency of surveillance required.*  Author: Clydie Braun, MD 05/10/2023 7:21 AM  For on call review [email or web address].

## 2023-05-10 NOTE — ED Notes (Signed)
ED TO INPATIENT HANDOFF REPORT  ED Nurse Name and Phone #:Treg Diemer RN [phone]  S Name/Age/Gender Liliana Cline Guadalupe 72 y.o. male Room/Bed: 006C/006C  Code Status   Code Status: Full Code  Home/SNF/Other Home  Patient oriented to: self, place, time, and situation Is this baseline? Yes   Triage Complete: Triage complete  Chief Complaint SBO (small bowel obstruction) (HCC) [K56.609]  Triage Note Per EMS pt from home, fell trying to transfer from couch to motorized wheelchair on to his bottom.  States right hip hurts but hurt before the fall.  N/V/D x3 days.    20 G R wrist 80/40 LR 60/43 92% RA, placed on 4L [place] CBG 294 (no hx of Diabetes)  HR 130   Allergies Allergies  Allergen Reactions   Sudafed [Pseudoephedrine] Palpitations and Other (See Comments)    Heart "races"    Level of Care/Admitting Diagnosis ED Disposition     ED Disposition  Admit   Condition  --   Comment  Hospital [facility] [[id number]]  Level of Care: Progressive [102]  Admit to Progressive based on following criteria: GI, ENDOCRINE disease patients with GI bleeding, acute liver failure or pancreatitis, stable with diabetic ketoacidosis or thyrotoxicosis (hypothyroid) state.  May admit patient to [facility] or [facility] if equivalent level of care is available:: No  Covid Evaluation: Asymptomatic - no recent exposure (last 10 days) testing not required  Diagnosis: SBO (small bowel obstruction) [facility]) [[id number]]  Admitting Physician: Clydie Braun [[id number]]  Attending Physician: Clydie Braun [[id number]]  Certification:: I certify this patient will need inpatient services for at least 2 midnights  Expected Medical Readiness: 05/12/2023          B Medical/Surgery History Past Medical History:  Diagnosis Date   Arthritis    Sheria Lang ulcer, chronic 01/11/2018   Chronic back pain    GERD (gastroesophageal reflux disease)    uses Baking Soda   GIB  (gastrointestinal bleeding) 2012   Glaucoma    right eye   Headache    occasionally   Hepatitis C    Hiatal hernia with gastroesophageal reflux disease and esophagitis 01/05/2018   History of blood transfusion    no abnormal reaction noted   History of gout    doesn't take any meds   Hyperlipidemia    not on any meds   Insomnia    takes Ambien nightly   Ischemic colitis (HCC) 2012   Joint pain    Nocturia    Numbness    both legs occasionally   PONV (postoperative nausea and vomiting)    Prostate cancer (HCC)    Shortness of breath dyspnea    rarely but when notices he can be lying/sitting/exertion.Dr.Hochrein is aware per pt   Stroke [facility]) 2016    Urinary frequency    Urinary urgency    Past Surgical History:  Procedure Laterality Date   APPENDECTOMY     BIOPSY  01/11/2018   Procedure: BIOPSY;  Surgeon: Iva Boop, MD;  Location: [facility] ENDOSCOPY;  Service: Endoscopy;;   COLONOSCOPY     COLONOSCOPY N/A 10/26/2015   Procedure: COLONOSCOPY;  Surgeon: Sherrilyn Rist, MD;  Location: [facility] ENDOSCOPY;  Service: Endoscopy;  Laterality: N/A;   ESOPHAGOGASTRODUODENOSCOPY     ESOPHAGOGASTRODUODENOSCOPY N/A 10/26/2015   Procedure: ESOPHAGOGASTRODUODENOSCOPY (EGD);  Surgeon: Sherrilyn Rist, MD;  Location: [facility] ENDOSCOPY;  Service: Endoscopy;  Laterality: N/A;   ESOPHAGOGASTRODUODENOSCOPY (EGD) WITH PROPOFOL N/A 01/11/2018  Procedure: ESOPHAGOGASTRODUODENOSCOPY (EGD) WITH PROPOFOL;  Surgeon: Iva Boop, MD;  Location: [facility] ENDOSCOPY;  Service: Endoscopy;  Laterality: N/A;   ESOPHAGOGASTRODUODENOSCOPY (EGD) WITH PROPOFOL N/A 12/26/2020   Procedure: ESOPHAGOGASTRODUODENOSCOPY (EGD) WITH PROPOFOL;  Surgeon: Meryl Dare, MD;  Location: [facility] ENDOSCOPY;  Service: Endoscopy;  Laterality: N/A;   HOT HEMOSTASIS N/A 12/26/2020   Procedure: HOT HEMOSTASIS (ARGON PLASMA COAGULATION/BICAP);  Surgeon: Meryl Dare, MD;  Location: [facility] ENDOSCOPY;  Service: Endoscopy;  Laterality: N/A;   INGUINAL  HERNIA REPAIR Right 11/04/2014   Procedure: RIGHT INGUINAL HERNIA REPAIR WITH MESH;  Surgeon: Avel Peace, MD;  Location[facility] OR;  Service: General;  Laterality: Right;   MASS EXCISION N/A 11/04/2014   Procedure: REMOVAL OF RIGHT GROIN SOFT TISSUE MASS;  Surgeon: Avel Peace, MD;  Location: [facility] OR;  Service: General;  Laterality: N/A;   Multiple abdominal surgeries     total of 13   PROSTATECTOMY     SMALL INTESTINE SURGERY       A IV Location/Drains/Wounds Patient Lines/Drains/Airways Status     Active Line/Drains/Airways     Name Placement date Placement time Site Days   Peripheral IV 05/10/23 20 G Right Antecubital 05/10/23  0459  Antecubital  less than 1   Peripheral IV 05/10/23 20 G Left Antecubital 05/10/23  0500  Antecubital  less than 1   Peripheral IV 05/10/23 20 G Anterior;Right Wrist 05/10/23  0500  Wrist  less than 1   NG/OG Vented/Dual Lumen 16 Fr. Right nare External length of tube 55 cm 05/10/23  0644  Right nare  less than 1   External Urinary Catheter 05/10/23  0930  --  less than 1            Intake/Output Last 24 hours  Intake/Output Summary (Last 24 hours) at 05/10/2023 1746 Last data filed at 05/10/2023 1642 Gross per 24 hour  Intake 1798.49 ml  Output 950 ml  Net 848.49 ml    Labs/Imaging Results for orders placed or performed during the hospital encounter of 05/10/23 (from the past 48 hour(s))  I-stat chem 8, ED     Status: Abnormal   Collection Time: 05/10/23  5:01 AM  Result Value Ref Range   Sodium 139 135 - 145 mmol/L   Potassium 3.3 (L) 3.5 - 5.1 mmol/L   Chloride 99 98 - 111 mmol/L   BUN 24 (H) 8 - 23 mg/dL   Creatinine, Ser 1.61 (H) 0.61 - 1.24 mg/dL   Glucose, Bld 096 (H) 70 - 99 mg/dL    Comment: Glucose reference range applies only to samples taken after fasting for at least 8 hours.   Calcium, Ion 1.13 (L) 1.15 - 1.40 mmol/L   TCO2 19 (L) 22 - 32 mmol/L   Hemoglobin 10.2 (L) 13.0 - 17.0 g/dL   HCT 04.5 (L) 40.9 - 81.1 %   I-Stat Lactic Acid     Status: Abnormal   Collection Time: 05/10/23  5:01 AM  Result Value Ref Range   Lactic Acid, Venous 11.9 (HH) 0.5 - 1.9 mmol/L   Comment NOTIFIED PHYSICIAN   Comprehensive metabolic panel     Status: Abnormal   Collection Time: 05/10/23  5:04 AM  Result Value Ref Range   Sodium 140 135 - 145 mmol/L   Potassium 3.4 (L) 3.5 - 5.1 mmol/L   Chloride 100 98 - 111 mmol/L   CO2 18 (L) 22 - 32 mmol/L   Glucose, Bld 127 (H) 70 - 99 mg/dL  Comment: Glucose reference range applies only to samples taken after fasting for at least 8 hours.   BUN 25 (H) 8 - 23 mg/dL   Creatinine, Ser 4.69 (H) 0.61 - 1.24 mg/dL   Calcium 9.7 8.9 - 62.9 mg/dL   Total Protein 6.6 6.5 - 8.1 g/dL   Albumin 3.4 (L) 3.5 - 5.0 g/dL   AST 22 15 - 41 U/L   ALT 11 0 - 44 U/L   Alkaline Phosphatase 74 38 - 126 U/L   Total Bilirubin 0.6 0.3 - 1.2 mg/dL   GFR, Estimated 18 (L) >60 mL/min    Comment: (NOTE) Calculated using the CKD-EPI Creatinine Equation (2021)    Anion gap 22 (H) 5 - 15    Comment: ELECTROLYTES REPEATED TO VERIFY Performed at [facility], [address], [name], [place] [address]   CBC with Differential     Status: Abnormal   Collection Time: 05/10/23  5:04 AM  Result Value Ref Range   WBC 14.9 (H) 4.0 - 10.5 K/uL   RBC 4.27 4.22 - 5.81 MIL/uL   Hemoglobin 7.3 (L) 13.0 - 17.0 g/dL    Comment: Reticulocyte Hemoglobin testing may be clinically indicated, consider ordering this additional test [id number]    HCT 29.1 (L) 39.0 - 52.0 %   MCV 68.1 (L) 80.0 - 100.0 fL   MCH 17.1 (L) 26.0 - 34.0 pg   MCHC 25.1 (L) 30.0 - 36.0 g/dL   RDW 72.5 (H) 36.6 - 44.0 %   Platelets 479 (H) 150 - 400 K/uL    Comment: REPEATED TO VERIFY   nRBC 0.5 (H) 0.0 - 0.2 %   Neutrophils Relative % 92 %   Neutro Abs 13.6 (H) 1.7 - 7.7 K/uL   Lymphocytes Relative 1 %   Lymphs Abs 0.2 (L) 0.7 - 4.0 K/uL   Monocytes Relative 6 %   Monocytes Absolute 0.9 0.1 - 1.0 K/uL   Eosinophils  Relative 0 %   Eosinophils Absolute 0.0 0.0 - 0.5 K/uL   Basophils Relative 0 %   Basophils Absolute 0.0 0.0 - 0.1 K/uL   Immature Granulocytes 1 %   Abs Immature Granulocytes 0.10 (H) 0.00 - 0.07 K/uL    Comment: Performed at [facility], [address], [place], [place] [address]  Lipase, blood     Status: None   Collection Time: 05/10/23  5:04 AM  Result Value Ref Range   Lipase 19 11 - 51 U/L    Comment: Performed at [facility], [address] [place], [place] [address]  D-dimer, quantitative     Status: None   Collection Time: 05/10/23  5:04 AM  Result Value Ref Range   D-Dimer, Quant <0.27 0.00 - 0.50 ug/mL-FEU    Comment: (NOTE) At the manufacturer cut-off value of 0.5 g/mL FEU, this assay has a negative predictive value of 95-100%.This assay is intended for use in conjunction with a clinical pretest probability (PTP) assessment model to exclude pulmonary embolism (PE) and deep venous thrombosis (DVT) in outpatients suspected of PE or DVT. Results should be correlated with clinical presentation. Performed at [facility], [address], [place], [place] [address]   CBG monitoring, ED     Status: Abnormal   Collection Time: 05/10/23  6:00 AM  Result Value Ref Range   Glucose-Capillary 137 (H) 70 - 99 mg/dL    Comment: Glucose reference range applies only to samples taken after fasting for at least  8 hours.   Comment 1 Notify RN    Comment 2 Document in Chart   Type and screen [facility]     Status: None   Collection Time: 05/10/23  6:21 AM  Result Value Ref Range   ABO/RH(D) O POS    Antibody Screen NEG    Sample Expiration      05/13/2023,2359 Performed at [facility], [address], [place], [place] [address]   I-Stat Lactic Acid     Status: Abnormal   Collection Time: 05/10/23  7:01 AM  Result Value Ref Range   Lactic Acid, Venous 7.0 (HH) 0.5 - 1.9 mmol/L   Comment NOTIFIED PHYSICIAN   CK     Status: None    Collection Time: 05/10/23  8:53 AM  Result Value Ref Range   Total CK 292 49 - 397 U/L    Comment: Performed at [facility], [address], [place], [place] [address]  SARS Coronavirus 2 by RT PCR (hospital order, performed in [facility] hospital lab) *cepheid single result test* Nasopharyngeal Swab     Status: None   Collection Time: 05/10/23  8:53 AM   Specimen: Nasopharyngeal Swab; Nasal Swab  Result Value Ref Range   SARS Coronavirus 2 by RT PCR NEGATIVE NEGATIVE    Comment: Performed at [facility], [address], [place], [place] [address]  Vitamin B12     Status: None   Collection Time: 05/10/23  8:53 AM  Result Value Ref Range   Vitamin B-12 232 180 - 914 pg/mL    Comment: (NOTE) This assay is not validated for testing neonatal or myeloproliferative syndrome specimens for Vitamin B12 levels. Performed at [facility], [address], [place], [place] [address]   Folate     Status: None   Collection Time: 05/10/23  8:53 AM  Result Value Ref Range   Folate 6.7 >5.9 ng/mL    Comment: Performed at [facility], [address] [place], [place] [address]  Iron and TIBC     Status: Abnormal   Collection Time: 05/10/23  8:53 AM  Result Value Ref Range   Iron <5 (L) 45 - 182 ug/dL    Comment: REPEATED TO VERIFY   TIBC 531 (H) 250 - 450 ug/dL   Saturation Ratios NOT CALCULATED 17.9 - 39.5 %   UIBC NOT CALCULATED ug/dL    Comment: Performed at [facility], [address] [place], [place] [address]  Ferritin     Status: Abnormal   Collection Time: 05/10/23  8:53 AM  Result Value Ref Range   Ferritin 5 (L) 24 - 336 ng/mL    Comment: Performed at [facility], [address], [place], [place] [address]  Reticulocytes     Status: Abnormal   Collection Time: 05/10/23  8:53 AM  Result Value Ref Range   Retic Ct Pct 1.3 0.4 - 3.1 %   RBC. 4.62 4.22 - 5.81 MIL/uL   Retic Count, Absolute 58.7 19.0 - 186.0 K/uL   Immature Retic Fract 18.6  (H) 2.3 - 15.9 %    Comment: Performed at [facility], [address] [place], [place] [address]  Hemoglobin and hematocrit, blood     Status: Abnormal   Collection Time: 05/10/23  8:53 AM  Result Value Ref Range   Hemoglobin 8.1 (L) 13.0 - 17.0 g/dL   HCT 28.4 (L) 13.2 - 44.0 %  Comment: Performed at [facility], [address] [place], [place] [address]  Lactic acid, plasma     Status: Abnormal   Collection Time: 05/10/23 11:03 AM  Result Value Ref Range   Lactic Acid, Venous 3.3 (HH) 0.5 - 1.9 mmol/L    Comment: CRITICAL RESULT CALLED TO, READ BACK BY AND VERIFIED WITH ROSS K,RN 1149 05/10/2023 SANDOVAL K Performed at [facility], [address] [place], [place] [address]   Urinalysis, w/ Reflex to Culture (Infection Suspected) -Urine, Clean Catch     Status: Abnormal   Collection Time: 05/10/23  4:48 PM  Result Value Ref Range   Specimen Source URINE, CLEAN CATCH    Color, Urine AMBER (A) YELLOW    Comment: BIOCHEMICALS MAY BE AFFECTED BY COLOR   APPearance CLOUDY (A) CLEAR   Specific Gravity, Urine 1.019 1.005 - 1.030   pH 5.0 5.0 - 8.0   Glucose, UA NEGATIVE NEGATIVE mg/dL   Hgb urine dipstick SMALL (A) NEGATIVE   Bilirubin Urine NEGATIVE NEGATIVE   Ketones, ur NEGATIVE NEGATIVE mg/dL   Protein, ur 621 (A) NEGATIVE mg/dL   Nitrite NEGATIVE NEGATIVE   Leukocytes,Ua LARGE (A) NEGATIVE   RBC / HPF 11-20 0 - 5 RBC/hpf   WBC, UA >50 0 - 5 WBC/hpf    Comment:        Reflex urine culture not performed if WBC <=10, OR if Squamous epithelial cells >5. If Squamous epithelial cells >5 suggest recollection.    Bacteria, UA RARE (A) NONE SEEN   Squamous Epithelial / HPF 0-5 0 - 5 /HPF   Mucus PRESENT    Hyaline Casts, UA PRESENT     Comment: Performed at [facility], [address] [place], [place] [address]  Sodium, urine, random     Status: None   Collection Time: 05/10/23  4:49 PM  Result Value Ref Range   Sodium, Ur 28 mmol/L    Comment:  Performed at [facility], [address] [place], [place] [address]  Creatinine, urine, random     Status: None   Collection Time: 05/10/23  4:49 PM  Result Value Ref Range   Creatinine, Urine 350 mg/dL    Comment: Performed at [facility], [address] [place], [place] [address]   DG Abd Portable 1 View  Result Date: 05/10/2023 CLINICAL DATA:  NG tube placement EXAM: PORTABLE ABDOMEN - 1 VIEW limited for tube placement COMPARISON:  CT 05/10/2023 earlier FINDINGS: NG tube with the tip coiling above the level of the diaphragm but patient has a known hiatal hernia. Along the visualized upper abdomen multiple dilated loops of small bowel are seen as well as some air in the colon on this limited portable semi upright view of the upper abdomen and lower chest. IMPRESSION: Enteric tube presumably in the known hiatal hernia above the diaphragm Electronically Signed   By: Karen Kays M.D.   On: 05/10/2023 10:11   CT ABDOMEN PELVIS WO CONTRAST  Result Date: 05/10/2023 CLINICAL DATA:  72 year old male with hypertension, fall. Pre-existing abdominal pain for several days. EXAM: CT ABDOMEN AND PELVIS WITHOUT CONTRAST TECHNIQUE: Multidetector CT imaging of the abdomen and pelvis was performed following the standard protocol without IV contrast. RADIATION DOSE REDUCTION: This exam was performed according to the departmental dose-optimization program which includes automated exposure control, adjustment of the mA and/or kV according to patient size and/or use of iterative reconstruction technique. COMPARISON:  Abdominal radiographs 05/09/2023.  CT Abdomen and Pelvis 02/14/2017. FINDINGS: Lower chest: Calcified coronary artery atherosclerosis. Normal heart size. No pericardial or pleural effusion. But fluid distension of the distal thoracic esophagus and moderate size chronic hiatal hernia. Mild lower lobe atelectasis. Hepatobiliary: Negative noncontrast liver and gallbladder. Pancreas: Negative.  Spleen: Negative. Adrenals/Urinary Tract: Stable since 2018, negative. Right renal vascular calcification. No convincing nephrolithiasis. Stomach/Bowel: Redundant but decompressed large bowel throughout the abdomen and pelvis. Decompressed terminal ileum and ileocecal valve on coronal image 91. Decompressed distal small bowel loops throughout the right lower quadrant. Fluid distended stomach including hiatal hernia. Fluid distended duodenum and proximal jejunum. Air-fluid containing jejunal loops are dilated up to 5 cm diameter, mostly in the left abdomen. There is a dilated chronic small bowel anastomosis in the right upper quadrant which is somewhat separate. Discrete transition point is difficult to identify but seems to be in the left anterior abdomen along the ventral abdominal wall. Chronic postoperative changes to the abdominal wall. No pneumoperitoneum identified. No free fluid in the abdomen. Vascular/Lymphatic: Extensive Aortoiliac calcified atherosclerosis. Vascular patency is not evaluated in the absence of IV contrast. Normal caliber abdominal aorta. No lymphadenopathy identified. Reproductive: Stable. Chronic heterogeneity of the right inguinal canal which is nonspecific. Other: Trace free fluid in the pelvis, simple fluid density series 4, image 85. Musculoskeletal: Widespread flowing vertebral endplate osteophytes. Subsequent multilevel lower thoracic interbody ankylosis. Progressed and severe lower lumbar disc and endplate degeneration since 2018. Progressed SI joint ankylosis since 2018. Chronic hip joint degeneration. Chronic left posterior 11th rib fracture. No acute osseous abnormality identified. IMPRESSION: 1. Positive for High-grade Small Bowel Obstruction. Discrete transition point is difficult to identify but seems to be beneath the left ventral abdominal wall. Chronic postoperative changes to the abdomen, favor adhesions. 2. Fluid distended stomach, gastric hiatal hernia, distal esophagus.  And a chronic small bowel anastomosis in the right abdomen is also dilated. Large bowel diffusely decompressed. Trace free fluid in the pelvis. No free air or free fluid identified in the abdomen. 3. No superimposed acute traumatic injury identified in the noncontrast abdomen or pelvis. Aortic Atherosclerosis (ICD10-I70.0). Electronically Signed   By: Odessa Fleming M.D.   On: 05/10/2023 06:30   CT Cervical Spine Wo Contrast  Result Date: 05/10/2023 CLINICAL DATA:  72 year old male with hypertension, fall. Pain. EXAM: CT CERVICAL SPINE WITHOUT CONTRAST TECHNIQUE: Multidetector CT imaging of the cervical spine was performed without intravenous contrast. Multiplanar CT image reconstructions were also generated. RADIATION DOSE REDUCTION: This exam was performed according to the departmental dose-optimization program which includes automated exposure control, adjustment of the mA and/or kV according to patient size and/or use of iterative reconstruction technique. COMPARISON:  Head CT today.  Cervical spine CT 02/12/2020. FINDINGS: Alignment: Chronic straightening of cervical lordosis. Cervicothoracic junction alignment is within normal limits. Bilateral posterior element alignment is within normal limits. Skull base and vertebrae: Visualized skull base is intact. No atlanto-occipital dissociation. C1 and C2 appear chronically degenerated, but intact and aligned. No acute osseous abnormality identified. Soft tissues and spinal canal: No prevertebral fluid or swelling. No visible canal hematoma. Calcified carotid bifurcation atherosclerosis. Negative other visible noncontrast neck soft tissues. Disc levels: Bulky and advanced cervical spine degeneration superimposed on chronic degenerative appearing ankylosis of C5-C6. Bulky disc and endplate disease with mild to moderate cervical spinal stenosis suspected C3-C4 through C5-C6. Probably not significant changed compared to the 2021 CT. Upper chest: Visible upper thoracic  levels appear intact. Upper lungs are clear. There is some retained fluid in the upper  thoracic esophagus. IMPRESSION: 1. No acute traumatic injury identified in the cervical spine. 2. Bulky cervical spine degeneration superimposed on chronic C5-C6 ankylosis. Chronic mild to moderate cervical spinal stenosis is suspected C3-C4 through C5-C6. Electronically Signed   By: Odessa Fleming M.D.   On: 05/10/2023 06:19   DG Chest Port 1 View  Result Date: 05/10/2023 CLINICAL DATA:  72 year old male with hypertension, fall. Pain. EXAM: PORTABLE CHEST 1 VIEW COMPARISON:  Portable chest 03/19/2020 and earlier. FINDINGS: Portable AP supine view at 0503 hours. Lower lung volumes. Moderate chronic hiatal hernia. Other mediastinal contours are within normal limits. Visualized tracheal air column is within normal limits. Crowding of lung markings at both lung bases. No pneumothorax, pulmonary edema, consolidation, or pleural effusion. No acute osseous abnormality identified. IMPRESSION: 1. Low lung volumes with basilar atelectasis. 2. Chronic hiatal hernia. Electronically Signed   By: Odessa Fleming M.D.   On: 05/10/2023 06:15   DG Hip Unilat W or Wo Pelvis 2-3 Views Right  Result Date: 05/10/2023 CLINICAL DATA:  72 year old male with hypertension, fall. Pain. EXAM: DG HIP (WITH OR WITHOUT PELVIS) 2-3V RIGHT COMPARISON:  07/24/2009 pelvis and right hip series. FINDINGS: AP view of the pelvis with AP and cross-table lateral views of the right hip. Femoral heads are normally located. Hip joint spaces remain symmetric. Chronic acetabular degenerative spurring. No acute pelvis fracture identified. Aortoiliac vascular calcifications are parent. Grossly intact proximal left femur. Proximal right femur appears intact. No acute osseous abnormality identified. IMPRESSION: 1. No acute fracture or dislocation identified about the right hip or pelvis. 2. Aortoiliac atherosclerosis. Electronically Signed   By: Odessa Fleming M.D.   On: 05/10/2023 06:14    DG Abd 1 View  Result Date: 05/10/2023 CLINICAL DATA:  72 year old male with three days of abdominal pain. EXAM: ABDOMEN - 1 VIEW COMPARISON:  CT Abdomen and Pelvis 02/14/2017 and earlier. FINDINGS: AP views on 05/09/2023 1000 hours. Evidence of low lung volumes with crowding at both lung bases. Central abdominal conglomeration of gas containing bowel loops appear mildly dilated and abnormal. This is probably small bowel. There is some gas in nondilated colon. No pneumoperitoneum is identified. No acute osseous abnormality identified. IMPRESSION: 1. Abnormal bowel gas pattern in the mid abdomen suspicious for small bowel obstruction. See CT Abdomen and Pelvis 05/10/2023 reported separately. 2. No pneumoperitoneum identified.  Lung base atelectasis. Electronically Signed   By: Odessa Fleming M.D.   On: 05/10/2023 06:11   CT Head Wo Contrast  Result Date: 05/10/2023 CLINICAL DATA:  72 year old male with hypertension, fall.  Pain. EXAM: CT HEAD WITHOUT CONTRAST TECHNIQUE: Contiguous axial images were obtained from the base of the skull through the vertex without intravenous contrast. RADIATION DOSE REDUCTION: This exam was performed according to the departmental dose-optimization program which includes automated exposure control, adjustment of the mA and/or kV according to patient size and/or use of iterative reconstruction technique. COMPARISON:  Head CT 11/17/2022. FINDINGS: Brain: Small chronic left side subdural fluid collection is mostly low-density and has not significantly changed since May, measuring 4-5 mm at most levels (series 3, image 23). Smaller contralateral low-density right subdural collection is also possible, 2-3 mm, and also stable. No midline shift or significant intracranial mass effect. No ventriculomegaly. Basilar cisterns remain patent. No acute intracranial hemorrhage identified. No cortically based acute infarct identified. Chronic small vessel ischemia including Patchy and confluent  bilateral white matter hypodensity, chronic lacunar infarct of the left thalamus. Heterogeneity in the brainstem. Stable gray-white matter differentiation throughout the brain.  Vascular: Calcified atherosclerosis at the skull base. No suspicious intracranial vascular hyperdensity. Skull: No fracture identified. Impacted left maxillary tooth incidentally noted. Sinuses/Orbits: Visualized paranasal sinuses and mastoids are stable and well aerated. Other: No orbit or scalp soft tissue injury identified. IMPRESSION: 1. No acute intracranial abnormality or acute traumatic injury identified. 2. Small chronic bilateral subdural fluid collections are suspected and stable since May (4-5 mm on the left, 2-3 mm on the right). 3. Advanced chronic small vessel disease appears stable by CT. Electronically Signed   By: Odessa Fleming M.D.   On: 05/10/2023 06:08    Pending Labs Unresulted Labs (From admission, onward)     Start     Ordered   05/11/23 0500  CBC  Tomorrow morning,   R        05/10/23 0744   05/11/23 0500  Comprehensive metabolic panel  Tomorrow morning,   R        05/10/23 0744   05/11/23 0500  Magnesium  Tomorrow morning,   R        05/10/23 1112   05/10/23 1648  Urine Culture  Once,   R        05/10/23 1648   05/10/23 0754  Respiratory (~20 pathogens) panel by PCR  (Respiratory panel by PCR (~20 pathogens, ~24 hr TAT)  w precautions)  Once,   R       Comments: SIRS    05/10/23 0753   05/10/23 0743  Urea nitrogen, urine  Once,   R        05/10/23 0744            Vitals/Pain Today's Vitals   05/10/23 1300 05/10/23 1500 05/10/23 1600 05/10/23 1653  BP: (!) 141/66 (!) 140/74 133/77 133/77  Pulse: (!) 106 (!) 106 98 (!) 103  Resp: (!) 35 (!) 23 (!) 30 (!) 22  Temp:    98.6 F (37 C)  TempSrc:      SpO2: 91% 91% (!) 89% (!) 88%  Weight:      Height:      PainSc:        Isolation Precautions Airborne and Contact precautions  Medications Medications  sodium chloride flush (NS) 0.9 %  injection 3 mL (3 mLs Intravenous Given 05/10/23 1055)  lactated ringers infusion (has no administration in time range)  acetaminophen (TYLENOL) tablet 650 mg (has no administration in time range)    Or  acetaminophen (TYLENOL) suppository 650 mg (has no administration in time range)  albuterol (PROVENTIL) (2.5 MG/3ML) 0.083% nebulizer solution 2.5 mg (has no administration in time range)  sodium bicarbonate 150 mEq in sterile water 1,150 mL infusion ( Intravenous New Bag/Given 05/10/23 0903)  cefTRIAXone (ROCEPHIN) 2 g in sodium chloride 0.9 % 100 mL IVPB (has no administration in time range)  metroNIDAZOLE (FLAGYL) IVPB 500 mg (has no administration in time range)  morphine (PF) 2 MG/ML injection 2 mg (2 mg Intravenous Given 05/10/23 1250)  pantoprazole (PROTONIX) injection 40 mg (40 mg Intravenous Given 05/10/23 1104)  nicotine (NICODERM CQ - dosed in mg/24 hours) patch 14 mg (14 mg Transdermal Patch Applied 05/10/23 1136)  diclofenac Sodium (VOLTAREN) 1 % topical gel 2 g (has no administration in time range)  ondansetron (ZOFRAN) injection 4 mg (has no administration in time range)  sodium chloride 0.9 % bolus 1,000 mL (0 mLs Intravenous Stopped 05/10/23 0646)  sodium chloride 0.9 % bolus 500 mL (0 mLs Intravenous Stopped 05/10/23 0722)  cefTRIAXone (ROCEPHIN) 2 g in  sodium chloride 0.9 % 100 mL IVPB (0 g Intravenous Stopped 05/10/23 0615)  metroNIDAZOLE (FLAGYL) IVPB 500 mg (0 mg Intravenous Stopped 05/10/23 0646)  potassium chloride 10 mEq in 100 mL IVPB (0 mEq Intravenous Stopped 05/10/23 1153)    Mobility      Focused Assessments    R Recommendations: See Admitting Provider Note  Report given to:   Additional Notes: Pt A/Ox4

## 2023-05-10 NOTE — Plan of Care (Signed)
  Problem: Education: Goal: Knowledge of General Education information will improve Description: Including pain rating scale, medication(s)/side effects and non-pharmacologic comfort measures Outcome: Progressing   Problem: Health Behavior/Discharge Planning: Goal: Ability to manage health-related needs will improve Outcome: Progressing   Problem: Clinical Measurements: Goal: Ability to maintain clinical measurements within normal limits will improve Outcome: Progressing Goal: Will remain free from infection Outcome: Progressing Goal: Diagnostic test results will improve Outcome: Progressing Goal: Respiratory complications will improve Outcome: Progressing Goal: Cardiovascular complication will be avoided Outcome: Progressing   Problem: Activity: Goal: Risk for activity intolerance will decrease Outcome: Progressing   Problem: Nutrition: Goal: Adequate nutrition will be maintained Outcome: Progressing   Problem: Coping: Goal: Level of anxiety will decrease Outcome: Progressing   Problem: Elimination: Goal: Will not experience complications related to bowel motility Outcome: Progressing Goal: Will not experience complications related to urinary retention Outcome: Progressing   Problem: Pain Management: Goal: General experience of comfort will improve Outcome: Progressing   Problem: Safety: Goal: Ability to remain free from injury will improve Outcome: Progressing   Problem: Skin Integrity: Goal: Risk for impaired skin integrity will decrease Outcome: Progressing   Problem: Education: Goal: Knowledge of General Education information will improve Description: Including pain rating scale, medication(s)/side effects and non-pharmacologic comfort measures Outcome: Progressing   Problem: Health Behavior/Discharge Planning: Goal: Ability to manage health-related needs will improve Outcome: Progressing   Problem: Clinical Measurements: Goal: Ability to maintain  clinical measurements within normal limits will improve Outcome: Progressing   Problem: Clinical Measurements: Goal: Will remain free from infection Outcome: Progressing   Problem: Clinical Measurements: Goal: Diagnostic test results will improve Outcome: Progressing   Problem: Clinical Measurements: Goal: Respiratory complications will improve Outcome: Progressing   Problem: Clinical Measurements: Goal: Cardiovascular complication will be avoided Outcome: Progressing   Problem: Activity: Goal: Risk for activity intolerance will decrease Outcome: Progressing   Problem: Nutrition: Goal: Adequate nutrition will be maintained Outcome: Progressing   Problem: Coping: Goal: Level of anxiety will decrease Outcome: Progressing   Problem: Elimination: Goal: Will not experience complications related to bowel motility Outcome: Progressing   Problem: Elimination: Goal: Will not experience complications related to urinary retention Outcome: Progressing   Problem: Pain Management: Goal: General experience of comfort will improve Outcome: Progressing   Problem: Skin Integrity: Goal: Risk for impaired skin integrity will decrease Outcome: Progressing   Problem: Safety: Goal: Ability to remain free from injury will improve Outcome: Progressing

## 2023-05-10 NOTE — ED Triage Notes (Signed)
Per EMS pt from home, fell trying to transfer from couch to motorized wheelchair on to his bottom.  States right hip hurts but hurt before the fall.  N/V/D x3 days.    20 G R wrist 80/40 LR 60/43 92% RA, placed on 4L [place] CBG 294 (no hx of Diabetes)  HR 130

## 2023-05-10 NOTE — ED Notes (Signed)
GAVE PT BROTHER UPDATE WITH PT PERMISSION

## 2023-05-11 ENCOUNTER — Inpatient Hospital Stay (HOSPITAL_COMMUNITY): Payer: 59

## 2023-05-11 DIAGNOSIS — K56609 Unspecified intestinal obstruction, unspecified as to partial versus complete obstruction: Secondary | ICD-10-CM | POA: Diagnosis not present

## 2023-05-11 LAB — MAGNESIUM: Magnesium: 2 mg/dL (ref 1.7–2.4)

## 2023-05-11 LAB — COMPREHENSIVE METABOLIC PANEL
ALT: 55 U/L — ABNORMAL HIGH (ref 0–44)
AST: 52 U/L — ABNORMAL HIGH (ref 15–41)
Albumin: 3.2 g/dL — ABNORMAL LOW (ref 3.5–5.0)
Alkaline Phosphatase: 64 U/L (ref 38–126)
Anion gap: 13 (ref 5–15)
BUN: 43 mg/dL — ABNORMAL HIGH (ref 8–23)
CO2: 33 mmol/L — ABNORMAL HIGH (ref 22–32)
Calcium: 8.3 mg/dL — ABNORMAL LOW (ref 8.9–10.3)
Chloride: 95 mmol/L — ABNORMAL LOW (ref 98–111)
Creatinine, Ser: 2.91 mg/dL — ABNORMAL HIGH (ref 0.61–1.24)
GFR, Estimated: 22 mL/min — ABNORMAL LOW (ref >60)
Glucose, Bld: 106 mg/dL — ABNORMAL HIGH (ref 70–99)
Potassium: 3.3 mmol/L — ABNORMAL LOW (ref 3.5–5.1)
Sodium: 141 mmol/L (ref 135–145)
Total Bilirubin: 0.6 mg/dL (ref 0.3–1.2)
Total Protein: 6.2 g/dL — ABNORMAL LOW (ref 6.5–8.1)

## 2023-05-11 LAB — CBC
HCT: 25.5 % — ABNORMAL LOW (ref 39.0–52.0)
HCT: 28.6 % — ABNORMAL LOW (ref 39.0–52.0)
Hemoglobin: 6.9 g/dL — CL (ref 13.0–17.0)
Hemoglobin: 8 g/dL — ABNORMAL LOW (ref 13.0–17.0)
MCH: 17.6 pg — ABNORMAL LOW (ref 26.0–34.0)
MCH: 18.6 pg — ABNORMAL LOW (ref 26.0–34.0)
MCHC: 27.1 g/dL — ABNORMAL LOW (ref 30.0–36.0)
MCHC: 28 g/dL — ABNORMAL LOW (ref 30.0–36.0)
MCV: 64.9 fL — ABNORMAL LOW (ref 80.0–100.0)
MCV: 66.4 fL — ABNORMAL LOW (ref 80.0–100.0)
Platelets: 371 10*3/uL (ref 150–400)
Platelets: 360 10*3/uL (ref 150–400)
RBC: 3.93 MIL/uL — ABNORMAL LOW (ref 4.22–5.81)
RBC: 4.31 MIL/uL (ref 4.22–5.81)
RDW: 19.7 % — ABNORMAL HIGH (ref 11.5–15.5)
RDW: 20.8 % — ABNORMAL HIGH (ref 11.5–15.5)
WBC: 7.6 10*3/uL (ref 4.0–10.5)
WBC: 7.2 10*3/uL (ref 4.0–10.5)
nRBC: 0 % (ref 0.0–0.2)
nRBC: 0 % (ref 0.0–0.2)

## 2023-05-11 LAB — PREPARE RBC (CROSSMATCH)

## 2023-05-11 LAB — URINE CULTURE: Culture: NO GROWTH

## 2023-05-11 LAB — GLUCOSE, CAPILLARY
Glucose-Capillary: 82 mg/dL (ref 70–99)
Glucose-Capillary: 79 mg/dL (ref 70–99)

## 2023-05-11 LAB — LACTIC ACID, PLASMA: Lactic Acid, Venous: 1.4 mmol/L (ref 0.5–1.9)

## 2023-05-11 MED ORDER — DEXTROSE IN LACTATED RINGERS 5 % IV SOLN: INTRAVENOUS | Status: AC

## 2023-05-11 MED ORDER — VITAMIN B-12 1000 MCG PO TABS
1000.0000 ug | ORAL_TABLET | Freq: Every day | ORAL | Status: DC
  Administered 2023-05-14 – 2023-05-15 (×2): 1000 ug via ORAL
  Filled 2023-05-11 (×2): qty 1

## 2023-05-11 MED ORDER — IRON SUCROSE 20 MG/ML IV SOLN
100.0000 mg | Freq: Once | INTRAVENOUS | Status: AC
  Administered 2023-05-11: 100 mg via INTRAVENOUS
  Filled 2023-05-11: qty 5

## 2023-05-11 MED ORDER — FOLIC ACID 1 MG PO TABS
1.0000 mg | ORAL_TABLET | Freq: Every day | ORAL | Status: DC
  Administered 2023-05-14 – 2023-05-15 (×2): 1 mg via ORAL
  Filled 2023-05-11 (×2): qty 1

## 2023-05-11 MED ORDER — CYANOCOBALAMIN 1000 MCG/ML IJ SOLN
1000.0000 ug | Freq: Every day | INTRAMUSCULAR | Status: AC
  Administered 2023-05-11 – 2023-05-13 (×3): 1000 ug via SUBCUTANEOUS
  Filled 2023-05-11 (×3): qty 1

## 2023-05-11 MED ORDER — POTASSIUM CHLORIDE 10 MEQ/100ML IV SOLN
10.0000 meq | INTRAVENOUS | Status: AC
  Administered 2023-05-11 (×4): 10 meq via INTRAVENOUS
  Filled 2023-05-11 (×4): qty 100

## 2023-05-11 MED ORDER — HEPARIN SODIUM (PORCINE) 5000 UNIT/ML IJ SOLN
5000.0000 [IU] | Freq: Three times a day (TID) | INTRAMUSCULAR | Status: DC
  Administered 2023-05-11 – 2023-05-15 (×13): 5000 [IU] via SUBCUTANEOUS
  Filled 2023-05-11 (×14): qty 1

## 2023-05-11 MED ORDER — DIATRIZOATE MEGLUMINE & SODIUM 66-10 % PO SOLN
90.0000 mL | Freq: Once | ORAL | Status: AC
  Administered 2023-05-11: 90 mL via NASOGASTRIC
  Filled 2023-05-11: qty 90

## 2023-05-11 MED ORDER — LIDOCAINE HCL URETHRAL/MUCOSAL 2 % EX GEL
1.0000 | Freq: Once | CUTANEOUS | Status: AC
  Administered 2023-05-11: 1 via TOPICAL
  Filled 2023-05-11: qty 6

## 2023-05-11 MED ORDER — SODIUM CHLORIDE 0.9% IV SOLUTION: Freq: Once | INTRAVENOUS | Status: AC

## 2023-05-11 MED ORDER — FERROUS SULFATE 325 (65 FE) MG PO TABS
325.0000 mg | ORAL_TABLET | Freq: Two times a day (BID) | ORAL | Status: DC
  Administered 2023-05-13 – 2023-05-15 (×6): 325 mg via ORAL
  Filled 2023-05-11 (×6): qty 1

## 2023-05-11 NOTE — Plan of Care (Signed)
  Problem: Education: Goal: Knowledge of General Education information will improve Description: Including pain rating scale, medication(s)/side effects and non-pharmacologic comfort measures Outcome: Progressing   Problem: Health Behavior/Discharge Planning: Goal: Ability to manage health-related needs will improve Outcome: Progressing   Problem: Clinical Measurements: Goal: Ability to maintain clinical measurements within normal limits will improve Outcome: Progressing Goal: Will remain free from infection Outcome: Progressing Goal: Diagnostic test results will improve Outcome: Progressing Goal: Respiratory complications will improve Outcome: Progressing Goal: Cardiovascular complication will be avoided Outcome: Progressing   Problem: Activity: Goal: Risk for activity intolerance will decrease Outcome: Progressing   Problem: Nutrition: Goal: Adequate nutrition will be maintained Outcome: Progressing   Problem: Coping: Goal: Level of anxiety will decrease Outcome: Progressing   Problem: Elimination: Goal: Will not experience complications related to bowel motility Outcome: Progressing Goal: Will not experience complications related to urinary retention Outcome: Progressing   Problem: Pain Management: Goal: General experience of comfort will improve Outcome: Progressing   Problem: Safety: Goal: Ability to remain free from injury will improve Outcome: Progressing   Problem: Skin Integrity: Goal: Risk for impaired skin integrity will decrease Outcome: Progressing   Problem: Education: Goal: Knowledge of General Education information will improve Description: Including pain rating scale, medication(s)/side effects and non-pharmacologic comfort measures Outcome: Progressing   Problem: Health Behavior/Discharge Planning: Goal: Ability to manage health-related needs will improve Outcome: Progressing   Problem: Clinical Measurements: Goal: Ability to maintain  clinical measurements within normal limits will improve Outcome: Progressing   Problem: Clinical Measurements: Goal: Will remain free from infection Outcome: Progressing   Problem: Clinical Measurements: Goal: Diagnostic test results will improve Outcome: Progressing   Problem: Clinical Measurements: Goal: Respiratory complications will improve Outcome: Progressing   Problem: Clinical Measurements: Goal: Cardiovascular complication will be avoided Outcome: Progressing   Problem: Activity: Goal: Risk for activity intolerance will decrease Outcome: Progressing   Problem: Activity: Goal: Risk for activity intolerance will decrease Outcome: Progressing   Problem: Nutrition: Goal: Adequate nutrition will be maintained Outcome: Progressing   Problem: Elimination: Goal: Will not experience complications related to bowel motility Outcome: Progressing   Problem: Elimination: Goal: Will not experience complications related to bowel motility Outcome: Progressing   Problem: Elimination: Goal: Will not experience complications related to urinary retention Outcome: Progressing   Problem: Pain Management: Goal: General experience of comfort will improve Outcome: Progressing   Problem: Safety: Goal: Ability to remain free from injury will improve Outcome: Progressing   Problem: Skin Integrity: Goal: Risk for impaired skin integrity will decrease Outcome: Progressing

## 2023-05-11 NOTE — Progress Notes (Signed)
Hgb is 6.9 this morning. Plan to transfuse 1 unit RBC.

## 2023-05-11 NOTE — Evaluation (Signed)
Physical Therapy Evaluation Patient Details Name: [name] MRN: [id number] DOB: 11-14-1950 Today's Date: 05/11/2023  History of Present Illness  Pt is a 72 y/o male admitted 10/22 with 3 days of not feeling well and with L lower quadrant abdominal pain progressing to generalized and severe pain.  Found to have SBO, NGT placed.   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  Pt not at baseline or mod I at w/c level, but improving at [facility] to min assist for tasks at simulated w/c level.  Pt currently limited functionally due to the problems listed below.  (see problems list.)  Pt will benefit from PT to maximize function and safety to be able to get home safely with available assist .         If plan is discharge home, recommend the following: Assist for transportation;Assistance with cooking/housework   Can travel by private vehicle        Equipment Recommendations  (power chair check up or new)  Recommendations for Other Services       Functional Status Assessment Patient has had a recent decline in their functional status and demonstrates the ability to make significant improvements in function in a reasonable and predictable amount of time.     Precautions / Restrictions Precautions Precautions: Fall      Mobility  Bed Mobility Overal bed mobility: Needs Assistance Bed Mobility: Supine to Sit     Supine to sit: Contact guard, HOB elevated     General bed mobility comments: uses B UE's appropriately for transition to EOB    Transfers Overall transfer  level: Needs assistance   Transfers: Bed to chair/wheelchair/BSC, Sit to/from Stand Sit to Stand: Contact guard assist, Min assist     Squat pivot transfers: Contact guard assist, Min assist     General transfer comment: pt needed min assist from lower surface without ability to pull up.  Reached for and used the w/c arm rest to stand initially.  Able to clear the w/c armrests.    Ambulation/Gait               General Gait Details: not able per pt-- stays at w/c level.  Stairs            Wheelchair Mobility     Tilt Bed    Modified Rankin (Stroke Patients Only)       Balance Overall balance assessment: Needs assistance Sitting-balance support: No upper extremity supported, Feet supported Sitting balance-Leahy Scale: Good Sitting balance - Comments: lotioned up bil LE's down to ankles at EOB without assist or help with balance.   Standing balance support: Bilateral upper extremity supported, During functional activity Standing balance-Leahy Scale: Poor Standing balance comment: reliant on stable surfaces/ UE's                             Pertinent Vitals/Pain Pain Assessment Pain Assessment: 0-10 Pain Score: 9  Pain Location: L flank/back Pain Descriptors / Indicators: Sore, Sharp, Discomfort Pain Intervention(s): Limited activity within patient's tolerance, Monitored during session    Home Living Family/patient expects to be discharged to::  Private residence Living Arrangements: Alone Available Help at Discharge: Friend(s);Family;Available PRN/intermittently;Personal care attendant (PCA 2 hrs w/days and 1 hr w/e's)   Home Access: Level entry;Ramped entrance       Home Layout: One level Home Equipment: BSC/3in1;Shower seat;Wheelchair - power Additional Comments: sleeps on the couch    Prior Function Prior Level of Function : Independent/Modified Independent             Mobility Comments: mod I from w/c level with mod I  transfers, but with occasional falls.  Pt rides to grocery store in the power chair or by power chair on the bus.  Little assist from family per pt. ADLs Comments: mod I from w/c level.  transfers over shower step to a shower seat fully in the shower, w/c to toilet and fixes food/meals.     Extremity/Trunk Assessment   Upper Extremity Assessment Upper Extremity Assessment: Defer to OT evaluation (Used UE's appropriately during transitions and transfers  R mildly weaker and less coordinated.)    Lower Extremity Assessment Lower Extremity Assessment: Generalized weakness (R weaker and less coordinated, pt doesn't get to a fully upright position.)    Cervical / Trunk Assessment Cervical / Trunk Assessment: Kyphotic  Communication   Communication Communication: No apparent difficulties  Cognition Arousal: Alert Behavior During Therapy: WFL for tasks assessed/performed Overall Cognitive Status: Within Functional Limits for tasks assessed (NT formally)                                          General Comments      Exercises     Assessment/Plan    PT Assessment Patient needs continued PT services  PT Problem List Decreased strength;Decreased activity tolerance;Decreased mobility;Decreased coordination;Pain       PT Treatment Interventions DME instruction;Functional mobility training;Therapeutic activities;Therapeutic exercise;Balance training;Neuromuscular re-education;Patient/family education;Wheelchair mobility training    PT Goals (Current goals can be found in the Care Plan section)  Acute Rehab PT Goals Patient Stated Goal: home when able PT Goal Formulation: With patient Time For Goal Achievement: 05/25/23 Potential to Achieve Goals: Good    Frequency Min 1X/week     Co-evaluation               AM-PAC PT "6 Clicks" Mobility  Outcome Measure Help needed turning from your back to your side while in a flat bed without using bedrails?: A  Little Help needed moving from lying on your back to sitting on the side of a flat bed without using bedrails?: A Little Help needed moving to and from a bed to a chair (including a wheelchair)?: A Little Help needed standing up from a chair using your arms (e.g., wheelchair or bedside chair)?: A Little Help needed to walk in hospital room?: Total Help needed climbing 3-5 steps with a railing? : Total 6 Click Score: 14    End of Session   Activity Tolerance: Patient tolerated treatment well Patient left: in chair;with chair alarm set;with call bell/phone within reach Nurse Communication: Mobility status PT Visit Diagnosis: Other abnormalities of gait and mobility (R26.89);Muscle weakness (generalized) (M62.81);Pain Pain - Right/Left: Left Pain - part of body:  (flank)    Time: 1425-1501 PT Time Calculation (min) (ACUTE ONLY): 36 min   Charges:   PT Evaluation $PT Eval Moderate Complexity: 1 Mod PT Treatments $Therapeutic Activity: 8-22 mins PT General Charges $$ ACUTE PT VISIT: 1 Visit  05/11/2023  Jacinto Halim., PT Acute Rehabilitation Services [phone]  (office)  Eliseo Gum Navpreet Szczygiel 05/11/2023, 3:22 PM

## 2023-05-11 NOTE — Progress Notes (Addendum)
PROGRESS NOTE                                                                                                                                                                                                             Patient Demographics:    [name], is a 72 y.o. male, DOB - 01/15/1951, VOZ:[id number]  Outpatient Primary MD for the patient is Loura Back, NP    LOS - 1  Admit date - 05/10/2023    Chief Complaint  Patient presents with   Fall   Hypotension       Brief Narrative (HPI from H&P)   72 y.o. male with medical history significant of hyperlipidemia, CVA, hepatitis C, iron deficiency anemia, small bowel obstruction s/p 2001 resection of bowel, GI bleed, hiatal hernia, and GERD who presents after having a fall at home, has been not feeling well for the last few days had some abdominal pain and nausea vomiting.  In the ER was diagnosed with small bowel obstruction, CT head and C-spine was nonacute and he was admitted to the hospital.  General surgery was consulted.   Subjective:    [name] today has, No headache, No chest pain, No abdominal pain - No Nausea, No new weakness tingling or numbness, no SOB   Assessment  & Plan :    Small bowel obstruction in a patient with History of small bowel obstruction and abdominal surgeries in the past. Records note prior history of small bowel obstruction back in 2001 which resulted in patient having multiple abdominal surgeries and resection of bowel.  Being treated conservatively, had NG tube placed, symptoms improved unfortunately he pulled his NG tube out night of 05/10/2023, currently symptom-free, still not passing flatus, monitor KUB, IV fluids, bowel rest, general surgery to follow.  If his symptoms recur will replace NG tube, have extensively counseled him and his brother.   Dehydration with lactic acidosis, hypotension and AKI.  Unclear if he had sepsis,  follow cultures, hydrate with IV fluids, continue empiric IV antibiotics.  Monitor.    Acute kidney injury - due to above, hydrate and monitor.   Iron deficiency anemia acute on chronic.  Some acute fall due to heme dilution from IV fluids.  Getting 1 unit of packed RBC transfusion on 05/11/2023, no signs of active bleeding, iron deficiency and low normal  B12 noted on anemia panel, replace as appropriate.  Fall at home, Right hip pain - Patient presented after having a fall at home.  Noted complaints of right hip pain prior to the fall.  No traumatic injury noted on CT imaging. Follow-up CK,   PT/OT     Hypokalemia  replace   Thrombocytosis  Chronic.  Platelet count elevated at 479, but appears to be around patient baseline. Continue to monitor   Tobacco abuse  Patient reports smoking half a pack of cigarettes per day on average. Nicotine patch     GERD, Hiatal hernia  -Protonix IV         Condition - Guarded  Family Communication  : Devonne Doughty [phone]  on 05/11/2023  Code Status :  Full  Consults  :  CCS  PUD Prophylaxis : PPI   Procedures  :     CT head.  1. No acute intracranial abnormality or acute traumatic injury identified. 2. Small chronic bilateral subdural fluid collections are suspected and stable since May (4-5 mm on the left, 2-3 mm on the right). 3. Advanced chronic small vessel disease appears stable by CT  CT C-spine.  1. No acute traumatic injury identified in the cervical spine. 2. Bulky cervical spine degeneration superimposed on chronic C5-C6 ankylosis. Chronic mild to moderate cervical spinal stenosis is suspected C3-C4 through C5-C6.  CT abdomen pelvis - 1. Positive for High-grade Small Bowel Obstruction. Discrete transition point is difficult to identify but seems to be beneath the left ventral abdominal wall. Chronic postoperative changes to the abdomen, favor adhesions. 2. Fluid distended stomach, gastric hiatal hernia, distal esophagus. And a  chronic small bowel anastomosis in the right abdomen is also dilated. Large bowel diffusely decompressed. Trace free fluid in the pelvis. No free air or free fluid identified in the abdomen. 3. No superimposed acute traumatic injury identified in the noncontrast abdomen or pelvis. Aortic Atherosclerosis (ICD10-I70.0)      Disposition Plan  :    Status is: Inpatient   DVT Prophylaxis  :  Heparin  SCDs Start: 05/10/23 0739    Lab Results  Component Value Date   PLT 371 05/11/2023    Diet :  Diet Order     None        Inpatient Medications  Scheduled Meds:  diclofenac Sodium  2 g Topical BID   lidocaine  1 Application Topical Once   nicotine  14 mg Transdermal Daily   pantoprazole (PROTONIX) IV  40 mg Intravenous Q24H   sodium chloride flush  3 mL Intravenous Q12H   Continuous Infusions:  cefTRIAXone (ROCEPHIN)  IV     dextrose 5% lactated ringers     metronidazole 500 mg (05/10/23 2012)   potassium chloride 10 mEq (05/11/23 0632)   PRN Meds:.acetaminophen **OR** acetaminophen, albuterol, morphine injection, ondansetron (ZOFRAN) IV  Antibiotics  :    Anti-infectives (From admission, onward)    Start     Dose/Rate Route Frequency Ordered Stop   05/11/23 0600  cefTRIAXone (ROCEPHIN) 2 g in sodium chloride 0.9 % 100 mL IVPB        2 g 200 mL/hr over 30 Minutes Intravenous Every 24 hours 05/10/23 0745     05/10/23 2000  metroNIDAZOLE (FLAGYL) IVPB 500 mg        500 mg 100 mL/hr over 60 Minutes Intravenous Every 12 hours 05/10/23 0745     05/10/23 0515  cefTRIAXone (ROCEPHIN) 2 g in sodium chloride 0.9 % 100 mL IVPB  2 g 200 mL/hr over 30 Minutes Intravenous  Once 05/10/23 0510 05/10/23 0615   05/10/23 0515  metroNIDAZOLE (FLAGYL) IVPB 500 mg        500 mg 100 mL/hr over 60 Minutes Intravenous  Once 05/10/23 0510 05/10/23 0646         Objective:   Vitals:   05/11/23 0400 05/11/23 0514 05/11/23 0515 05/11/23 0534  BP: (!) 142/67 138/63  139/67   Pulse: 77 77    Resp: 20     Temp: 98.4 F (36.9 C) 98.4 F (36.9 C) 98.4 F (36.9 C) 98.4 F (36.9 C)  TempSrc: Oral Oral Oral Oral  SpO2: 94% 92%    Weight:      Height:        Wt Readings from Last 3 Encounters:  05/10/23 93.4 kg  11/17/22 93.4 kg  02/12/22 90.4 kg     Intake/Output Summary (Last 24 hours) at 05/11/2023 0809 Last data filed at 05/11/2023 0730 Gross per 24 hour  Intake 466.82 ml  Output 950 ml  Net -483.18 ml     Physical Exam  Awake Alert, No new F.N deficits, Normal affect [place].AT,PERRAL Supple Neck, No JVD,   Symmetrical Chest wall movement, Good air movement bilaterally, CTAB RRR,No Gallops,Rubs or new Murmurs,  Hypoactive but +ve B.Sounds, Abd Soft, No tenderness,   No Cyanosis, Clubbing or edema       Data Review:    Recent Labs  Lab 05/10/23 0501 05/10/23 0504 05/10/23 0853 05/11/23 0326  WBC  --  14.9*  --  7.6  HGB 10.2* 7.3* 8.1* 6.9*  HCT 30.0* 29.1* 30.7* 25.5*  PLT  --  479*  --  371  MCV  --  68.1*  --  64.9*  MCH  --  17.1*  --  17.6*  MCHC  --  25.1*  --  27.1*  RDW  --  19.9*  --  19.7*  LYMPHSABS  --  0.2*  --   --   MONOABS  --  0.9  --   --   EOSABS  --  0.0  --   --   BASOSABS  --  0.0  --   --     Recent Labs  Lab 05/10/23 0501 05/10/23 0504 05/10/23 0701 05/10/23 1103 05/11/23 0326  NA 139 140  --   --  141  K 3.3* 3.4*  --   --  3.3*  CL 99 100  --   --  95*  CO2  --  18*  --   --  33*  ANIONGAP  --  22*  --   --  13  GLUCOSE 126* 127*  --   --  106*  BUN 24* 25*  --   --  43*  CREATININE 3.60* 3.46*  --   --  2.91*  AST  --  22  --   --  52*  ALT  --  11  --   --  55*  ALKPHOS  --  74  --   --  64  BILITOT  --  0.6  --   --  0.6  ALBUMIN  --  3.4*  --   --  3.2*  DDIMER  --  <0.27  --   --   --   LATICACIDVEN 11.9*  --  7.0* 3.3*  --   MG  --   --   --   --  2.0  CALCIUM  --  9.7  --   --  8.3*      Recent Labs  Lab 05/10/23 0501 05/10/23 0504 05/10/23 0701 05/10/23 1103  05/11/23 0326  DDIMER  --  <0.27  --   --   --   LATICACIDVEN 11.9*  --  7.0* 3.3*  --   MG  --   --   --   --  2.0  CALCIUM  --  9.7  --   --  8.3*    --------------------------------------------------------------------------------------------------------------- Lab Results  Component Value Date   CHOL 108 06/17/2021   HDL 39.80 06/17/2021   LDLCALC 47 06/17/2021   TRIG 104.0 06/17/2021   CHOLHDL 3 06/17/2021    Lab Results  Component Value Date   HGBA1C 4.7 (L) 07/22/2018   No results for input(s): "TSH", "T4TOTAL", "FREET4", "T3FREE", "THYROIDAB" in the last 72 hours. Recent Labs    05/10/23 0853  VITAMINB12 232  FOLATE 6.7  FERRITIN 5*  TIBC 531*  IRON <5*  RETICCTPCT 1.3   ------------------------------------------------------------------------------------------------------------------ Cardiac Enzymes No results for input(s): "CKMB", "TROPONINI", "MYOGLOBIN" in the last 168 hours.  Invalid input(s): "CK"  Micro Results Recent Results (from the past 240 hour(s))  SARS Coronavirus 2 by RT PCR (hospital order, performed in [facility] hospital lab) *cepheid single result test* Nasopharyngeal Swab     Status: None   Collection Time: 05/10/23  8:53 AM   Specimen: Nasopharyngeal Swab; Nasal Swab  Result Value Ref Range Status   SARS Coronavirus 2 by RT PCR NEGATIVE NEGATIVE Final    Comment: Performed at [facility], [address], [place], [place] [address]    Radiology Reports DG Abd Portable 1 View  Result Date: 05/10/2023 CLINICAL DATA:  NG tube placement EXAM: PORTABLE ABDOMEN - 1 VIEW limited for tube placement COMPARISON:  CT 05/10/2023 earlier FINDINGS: NG tube with the tip coiling above the level of the diaphragm but patient has a known hiatal hernia. Along the visualized upper abdomen multiple dilated loops of small bowel are seen as well as some air in the colon on this limited portable semi upright view of the upper abdomen and lower chest.  IMPRESSION: Enteric tube presumably in the known hiatal hernia above the diaphragm Electronically Signed   By: Karen Kays M.D.   On: 05/10/2023 10:11   CT ABDOMEN PELVIS WO CONTRAST  Result Date: 05/10/2023 CLINICAL DATA:  72 year old male with hypertension, fall. Pre-existing abdominal pain for several days. EXAM: CT ABDOMEN AND PELVIS WITHOUT CONTRAST TECHNIQUE: Multidetector CT imaging of the abdomen and pelvis was performed following the standard protocol without IV contrast. RADIATION DOSE REDUCTION: This exam was performed according to the departmental dose-optimization program which includes automated exposure control, adjustment of the mA and/or kV according to patient size and/or use of iterative reconstruction technique. COMPARISON:  Abdominal radiographs 05/09/2023. CT Abdomen and Pelvis 02/14/2017. FINDINGS: Lower chest: Calcified coronary artery atherosclerosis. Normal heart size. No pericardial or pleural effusion. But fluid distension of the distal thoracic esophagus and moderate size chronic hiatal hernia. Mild lower lobe atelectasis. Hepatobiliary: Negative noncontrast liver and gallbladder. Pancreas: Negative. Spleen: Negative. Adrenals/Urinary Tract: Stable since 2018, negative. Right renal vascular calcification. No convincing nephrolithiasis. Stomach/Bowel: Redundant but decompressed large bowel throughout the abdomen and pelvis. Decompressed terminal ileum and ileocecal valve on coronal image 91. Decompressed distal small bowel loops throughout the right lower quadrant. Fluid distended stomach including hiatal hernia. Fluid distended duodenum and proximal jejunum. Air-fluid containing jejunal loops are dilated up to 5 cm diameter, mostly in the left abdomen. There  is a dilated chronic small bowel anastomosis in the right upper quadrant which is somewhat separate. Discrete transition point is difficult to identify but seems to be in the left anterior abdomen along the ventral abdominal  wall. Chronic postoperative changes to the abdominal wall. No pneumoperitoneum identified. No free fluid in the abdomen. Vascular/Lymphatic: Extensive Aortoiliac calcified atherosclerosis. Vascular patency is not evaluated in the absence of IV contrast. Normal caliber abdominal aorta. No lymphadenopathy identified. Reproductive: Stable. Chronic heterogeneity of the right inguinal canal which is nonspecific. Other: Trace free fluid in the pelvis, simple fluid density series 4, image 85. Musculoskeletal: Widespread flowing vertebral endplate osteophytes. Subsequent multilevel lower thoracic interbody ankylosis. Progressed and severe lower lumbar disc and endplate degeneration since 2018. Progressed SI joint ankylosis since 2018. Chronic hip joint degeneration. Chronic left posterior 11th rib fracture. No acute osseous abnormality identified. IMPRESSION: 1. Positive for High-grade Small Bowel Obstruction. Discrete transition point is difficult to identify but seems to be beneath the left ventral abdominal wall. Chronic postoperative changes to the abdomen, favor adhesions. 2. Fluid distended stomach, gastric hiatal hernia, distal esophagus. And a chronic small bowel anastomosis in the right abdomen is also dilated. Large bowel diffusely decompressed. Trace free fluid in the pelvis. No free air or free fluid identified in the abdomen. 3. No superimposed acute traumatic injury identified in the noncontrast abdomen or pelvis. Aortic Atherosclerosis (ICD10-I70.0). Electronically Signed   By: Odessa Fleming M.D.   On: 05/10/2023 06:30   CT Cervical Spine Wo Contrast  Result Date: 05/10/2023 CLINICAL DATA:  72 year old male with hypertension, fall. Pain. EXAM: CT CERVICAL SPINE WITHOUT CONTRAST TECHNIQUE: Multidetector CT imaging of the cervical spine was performed without intravenous contrast. Multiplanar CT image reconstructions were also generated. RADIATION DOSE REDUCTION: This exam was performed according to the  departmental dose-optimization program which includes automated exposure control, adjustment of the mA and/or kV according to patient size and/or use of iterative reconstruction technique. COMPARISON:  Head CT today.  Cervical spine CT 02/12/2020. FINDINGS: Alignment: Chronic straightening of cervical lordosis. Cervicothoracic junction alignment is within normal limits. Bilateral posterior element alignment is within normal limits. Skull base and vertebrae: Visualized skull base is intact. No atlanto-occipital dissociation. C1 and C2 appear chronically degenerated, but intact and aligned. No acute osseous abnormality identified. Soft tissues and spinal canal: No prevertebral fluid or swelling. No visible canal hematoma. Calcified carotid bifurcation atherosclerosis. Negative other visible noncontrast neck soft tissues. Disc levels: Bulky and advanced cervical spine degeneration superimposed on chronic degenerative appearing ankylosis of C5-C6. Bulky disc and endplate disease with mild to moderate cervical spinal stenosis suspected C3-C4 through C5-C6. Probably not significant changed compared to the 2021 CT. Upper chest: Visible upper thoracic levels appear intact. Upper lungs are clear. There is some retained fluid in the upper thoracic esophagus. IMPRESSION: 1. No acute traumatic injury identified in the cervical spine. 2. Bulky cervical spine degeneration superimposed on chronic C5-C6 ankylosis. Chronic mild to moderate cervical spinal stenosis is suspected C3-C4 through C5-C6. Electronically Signed   By: Odessa Fleming M.D.   On: 05/10/2023 06:19   DG Chest Port 1 View  Result Date: 05/10/2023 CLINICAL DATA:  72 year old male with hypertension, fall. Pain. EXAM: PORTABLE CHEST 1 VIEW COMPARISON:  Portable chest 03/19/2020 and earlier. FINDINGS: Portable AP supine view at 0503 hours. Lower lung volumes. Moderate chronic hiatal hernia. Other mediastinal contours are within normal limits. Visualized tracheal air  column is within normal limits. Crowding of lung markings at both lung bases. No pneumothorax,  pulmonary edema, consolidation, or pleural effusion. No acute osseous abnormality identified. IMPRESSION: 1. Low lung volumes with basilar atelectasis. 2. Chronic hiatal hernia. Electronically Signed   By: Odessa Fleming M.D.   On: 05/10/2023 06:15   DG Hip Unilat W or Wo Pelvis 2-3 Views Right  Result Date: 05/10/2023 CLINICAL DATA:  72 year old male with hypertension, fall. Pain. EXAM: DG HIP (WITH OR WITHOUT PELVIS) 2-3V RIGHT COMPARISON:  07/24/2009 pelvis and right hip series. FINDINGS: AP view of the pelvis with AP and cross-table lateral views of the right hip. Femoral heads are normally located. Hip joint spaces remain symmetric. Chronic acetabular degenerative spurring. No acute pelvis fracture identified. Aortoiliac vascular calcifications are parent. Grossly intact proximal left femur. Proximal right femur appears intact. No acute osseous abnormality identified. IMPRESSION: 1. No acute fracture or dislocation identified about the right hip or pelvis. 2. Aortoiliac atherosclerosis. Electronically Signed   By: Odessa Fleming M.D.   On: 05/10/2023 06:14   DG Abd 1 View  Result Date: 05/10/2023 CLINICAL DATA:  72 year old male with three days of abdominal pain. EXAM: ABDOMEN - 1 VIEW COMPARISON:  CT Abdomen and Pelvis 02/14/2017 and earlier. FINDINGS: AP views on 05/09/2023 1000 hours. Evidence of low lung volumes with crowding at both lung bases. Central abdominal conglomeration of gas containing bowel loops appear mildly dilated and abnormal. This is probably small bowel. There is some gas in nondilated colon. No pneumoperitoneum is identified. No acute osseous abnormality identified. IMPRESSION: 1. Abnormal bowel gas pattern in the mid abdomen suspicious for small bowel obstruction. See CT Abdomen and Pelvis 05/10/2023 reported separately. 2. No pneumoperitoneum identified.  Lung base atelectasis. Electronically  Signed   By: Odessa Fleming M.D.   On: 05/10/2023 06:11   CT Head Wo Contrast  Result Date: 05/10/2023 CLINICAL DATA:  72 year old male with hypertension, fall.  Pain. EXAM: CT HEAD WITHOUT CONTRAST TECHNIQUE: Contiguous axial images were obtained from the base of the skull through the vertex without intravenous contrast. RADIATION DOSE REDUCTION: This exam was performed according to the departmental dose-optimization program which includes automated exposure control, adjustment of the mA and/or kV according to patient size and/or use of iterative reconstruction technique. COMPARISON:  Head CT 11/17/2022. FINDINGS: Brain: Small chronic left side subdural fluid collection is mostly low-density and has not significantly changed since May, measuring 4-5 mm at most levels (series 3, image 23). Smaller contralateral low-density right subdural collection is also possible, 2-3 mm, and also stable. No midline shift or significant intracranial mass effect. No ventriculomegaly. Basilar cisterns remain patent. No acute intracranial hemorrhage identified. No cortically based acute infarct identified. Chronic small vessel ischemia including Patchy and confluent bilateral white matter hypodensity, chronic lacunar infarct of the left thalamus. Heterogeneity in the brainstem. Stable gray-white matter differentiation throughout the brain. Vascular: Calcified atherosclerosis at the skull base. No suspicious intracranial vascular hyperdensity. Skull: No fracture identified. Impacted left maxillary tooth incidentally noted. Sinuses/Orbits: Visualized paranasal sinuses and mastoids are stable and well aerated. Other: No orbit or scalp soft tissue injury identified. IMPRESSION: 1. No acute intracranial abnormality or acute traumatic injury identified. 2. Small chronic bilateral subdural fluid collections are suspected and stable since May (4-5 mm on the left, 2-3 mm on the right). 3. Advanced chronic small vessel disease appears stable by  CT. Electronically Signed   By: Odessa Fleming M.D.   On: 05/10/2023 06:08      Signature  -   Susa Raring M.D on 05/11/2023 at 8:09 AM   -  To page go to [email or web address]

## 2023-05-11 NOTE — TOC CM/SW Note (Signed)
CM acknowledges "Consult to [facility] for HH/Transportation home "  TOC will follow and arrange as needed .

## 2023-05-11 NOTE — Progress Notes (Signed)
Patient ID: [name], male   DOB: May 27, 1951, 72 y.o.   MRN: [id number] [facility] Surgery Progress Note     Subjective: CC-  Feeling about the same as yesterday. Continues to have left sided abdominal pain. States that he passed a small amount of flatus. He accidentally pulled his NG out around 1am.  Objective: Vital signs in last 24 hours: Temp:  [98.4 F (36.9 C)-98.9 F (37.2 C)] 98.4 F (36.9 C) (10/23 0534) Pulse Rate:  [77-109] 77 (10/23 0514) Resp:  [15-35] 20 (10/23 0400) BP: (120-154)/(63-78) 139/67 (10/23 0534) SpO2:  [88 %-94 %] 92 % (10/23 0514)    Intake/Output from previous day: 10/22 0701 - 10/23 0700 In: 598.5 [IV Piggyback:598.5] Out: 950  Intake/Output this shift: Total I/O In: 368.3 [Blood:368.3] Out: -   PE: Gen:  Alert, NAD, pleasant Card:  RRR Pulm:  rate and effort normal on room air Abd: some distension but soft, few bowel sounds heard, focal tenderness in the left hemiabdomen is moderate, no diffuse peritonitis  Lab Results:  Recent Labs    05/10/23 0504 05/10/23 0853 05/11/23 0326  WBC 14.9*  --  7.6  HGB 7.3* 8.1* 6.9*  HCT 29.1* 30.7* 25.5*  PLT 479*  --  371   BMET Recent Labs    05/10/23 0504 05/11/23 0326  NA 140 141  K 3.4* 3.3*  CL 100 95*  CO2 18* 33*  GLUCOSE 127* 106*  BUN 25* 43*  CREATININE 3.46* 2.91*  CALCIUM 9.7 8.3*   PT/INR No results for input(s): "LABPROT", "INR" in the last 72 hours. CMP     Component Value Date/Time   NA 141 05/11/2023 0326   NA 139 09/22/2020 0000   K 3.3 (L) 05/11/2023 0326   CL 95 (L) 05/11/2023 0326   CO2 33 (H) 05/11/2023 0326   GLUCOSE 106 (H) 05/11/2023 0326   BUN 43 (H) 05/11/2023 0326   BUN 14 06/23/2017 1413   CREATININE 2.91 (H) 05/11/2023 0326   CREATININE 1.18 03/31/2020 1441   CALCIUM 8.3 (L) 05/11/2023 0326   PROT 6.2 (L) 05/11/2023 0326   ALBUMIN 3.2 (L) 05/11/2023 0326   AST 52 (H) 05/11/2023 0326   ALT 55 (H) 05/11/2023 0326   ALT 16  05/12/2016 1550   ALKPHOS 64 05/11/2023 0326   BILITOT 0.6 05/11/2023 0326   GFRNONAA 22 (L) 05/11/2023 0326   GFRNONAA 62 03/31/2020 1441   GFRAA 72 03/31/2020 1441   Lipase     Component Value Date/Time   LIPASE 19 05/10/2023 0504       Studies/Results: DG Abd Portable 1 View  Result Date: 05/10/2023 CLINICAL DATA:  NG tube placement EXAM: PORTABLE ABDOMEN - 1 VIEW limited for tube placement COMPARISON:  CT 05/10/2023 earlier FINDINGS: NG tube with the tip coiling above the level of the diaphragm but patient has a known hiatal hernia. Along the visualized upper abdomen multiple dilated loops of small bowel are seen as well as some air in the colon on this limited portable semi upright view of the upper abdomen and lower chest. IMPRESSION: Enteric tube presumably in the known hiatal hernia above the diaphragm Electronically Signed   By: Karen Kays M.D.   On: 05/10/2023 10:11   CT ABDOMEN PELVIS WO CONTRAST  Result Date: 05/10/2023 CLINICAL DATA:  72 year old male with hypertension, fall. Pre-existing abdominal pain for several days. EXAM: CT ABDOMEN AND PELVIS WITHOUT CONTRAST TECHNIQUE: Multidetector CT imaging of the abdomen and pelvis was performed following the  standard protocol without IV contrast. RADIATION DOSE REDUCTION: This exam was performed according to the departmental dose-optimization program which includes automated exposure control, adjustment of the mA and/or kV according to patient size and/or use of iterative reconstruction technique. COMPARISON:  Abdominal radiographs 05/09/2023. CT Abdomen and Pelvis 02/14/2017. FINDINGS: Lower chest: Calcified coronary artery atherosclerosis. Normal heart size. No pericardial or pleural effusion. But fluid distension of the distal thoracic esophagus and moderate size chronic hiatal hernia. Mild lower lobe atelectasis. Hepatobiliary: Negative noncontrast liver and gallbladder. Pancreas: Negative. Spleen: Negative. Adrenals/Urinary  Tract: Stable since 2018, negative. Right renal vascular calcification. No convincing nephrolithiasis. Stomach/Bowel: Redundant but decompressed large bowel throughout the abdomen and pelvis. Decompressed terminal ileum and ileocecal valve on coronal image 91. Decompressed distal small bowel loops throughout the right lower quadrant. Fluid distended stomach including hiatal hernia. Fluid distended duodenum and proximal jejunum. Air-fluid containing jejunal loops are dilated up to 5 cm diameter, mostly in the left abdomen. There is a dilated chronic small bowel anastomosis in the right upper quadrant which is somewhat separate. Discrete transition point is difficult to identify but seems to be in the left anterior abdomen along the ventral abdominal wall. Chronic postoperative changes to the abdominal wall. No pneumoperitoneum identified. No free fluid in the abdomen. Vascular/Lymphatic: Extensive Aortoiliac calcified atherosclerosis. Vascular patency is not evaluated in the absence of IV contrast. Normal caliber abdominal aorta. No lymphadenopathy identified. Reproductive: Stable. Chronic heterogeneity of the right inguinal canal which is nonspecific. Other: Trace free fluid in the pelvis, simple fluid density series 4, image 85. Musculoskeletal: Widespread flowing vertebral endplate osteophytes. Subsequent multilevel lower thoracic interbody ankylosis. Progressed and severe lower lumbar disc and endplate degeneration since 2018. Progressed SI joint ankylosis since 2018. Chronic hip joint degeneration. Chronic left posterior 11th rib fracture. No acute osseous abnormality identified. IMPRESSION: 1. Positive for High-grade Small Bowel Obstruction. Discrete transition point is difficult to identify but seems to be beneath the left ventral abdominal wall. Chronic postoperative changes to the abdomen, favor adhesions. 2. Fluid distended stomach, gastric hiatal hernia, distal esophagus. And a chronic small bowel  anastomosis in the right abdomen is also dilated. Large bowel diffusely decompressed. Trace free fluid in the pelvis. No free air or free fluid identified in the abdomen. 3. No superimposed acute traumatic injury identified in the noncontrast abdomen or pelvis. Aortic Atherosclerosis (ICD10-I70.0). Electronically Signed   By: Odessa Fleming M.D.   On: 05/10/2023 06:30   CT Cervical Spine Wo Contrast  Result Date: 05/10/2023 CLINICAL DATA:  72 year old male with hypertension, fall. Pain. EXAM: CT CERVICAL SPINE WITHOUT CONTRAST TECHNIQUE: Multidetector CT imaging of the cervical spine was performed without intravenous contrast. Multiplanar CT image reconstructions were also generated. RADIATION DOSE REDUCTION: This exam was performed according to the departmental dose-optimization program which includes automated exposure control, adjustment of the mA and/or kV according to patient size and/or use of iterative reconstruction technique. COMPARISON:  Head CT today.  Cervical spine CT 02/12/2020. FINDINGS: Alignment: Chronic straightening of cervical lordosis. Cervicothoracic junction alignment is within normal limits. Bilateral posterior element alignment is within normal limits. Skull base and vertebrae: Visualized skull base is intact. No atlanto-occipital dissociation. C1 and C2 appear chronically degenerated, but intact and aligned. No acute osseous abnormality identified. Soft tissues and spinal canal: No prevertebral fluid or swelling. No visible canal hematoma. Calcified carotid bifurcation atherosclerosis. Negative other visible noncontrast neck soft tissues. Disc levels: Bulky and advanced cervical spine degeneration superimposed on chronic degenerative appearing ankylosis of C5-C6. Bulky disc  and endplate disease with mild to moderate cervical spinal stenosis suspected C3-C4 through C5-C6. Probably not significant changed compared to the 2021 CT. Upper chest: Visible upper thoracic levels appear intact. Upper  lungs are clear. There is some retained fluid in the upper thoracic esophagus. IMPRESSION: 1. No acute traumatic injury identified in the cervical spine. 2. Bulky cervical spine degeneration superimposed on chronic C5-C6 ankylosis. Chronic mild to moderate cervical spinal stenosis is suspected C3-C4 through C5-C6. Electronically Signed   By: Odessa Fleming M.D.   On: 05/10/2023 06:19   DG Chest Port 1 View  Result Date: 05/10/2023 CLINICAL DATA:  72 year old male with hypertension, fall. Pain. EXAM: PORTABLE CHEST 1 VIEW COMPARISON:  Portable chest 03/19/2020 and earlier. FINDINGS: Portable AP supine view at 0503 hours. Lower lung volumes. Moderate chronic hiatal hernia. Other mediastinal contours are within normal limits. Visualized tracheal air column is within normal limits. Crowding of lung markings at both lung bases. No pneumothorax, pulmonary edema, consolidation, or pleural effusion. No acute osseous abnormality identified. IMPRESSION: 1. Low lung volumes with basilar atelectasis. 2. Chronic hiatal hernia. Electronically Signed   By: Odessa Fleming M.D.   On: 05/10/2023 06:15   DG Hip Unilat W or Wo Pelvis 2-3 Views Right  Result Date: 05/10/2023 CLINICAL DATA:  72 year old male with hypertension, fall. Pain. EXAM: DG HIP (WITH OR WITHOUT PELVIS) 2-3V RIGHT COMPARISON:  07/24/2009 pelvis and right hip series. FINDINGS: AP view of the pelvis with AP and cross-table lateral views of the right hip. Femoral heads are normally located. Hip joint spaces remain symmetric. Chronic acetabular degenerative spurring. No acute pelvis fracture identified. Aortoiliac vascular calcifications are parent. Grossly intact proximal left femur. Proximal right femur appears intact. No acute osseous abnormality identified. IMPRESSION: 1. No acute fracture or dislocation identified about the right hip or pelvis. 2. Aortoiliac atherosclerosis. Electronically Signed   By: Odessa Fleming M.D.   On: 05/10/2023 06:14   DG Abd 1 View  Result  Date: 05/10/2023 CLINICAL DATA:  72 year old male with three days of abdominal pain. EXAM: ABDOMEN - 1 VIEW COMPARISON:  CT Abdomen and Pelvis 02/14/2017 and earlier. FINDINGS: AP views on 05/09/2023 1000 hours. Evidence of low lung volumes with crowding at both lung bases. Central abdominal conglomeration of gas containing bowel loops appear mildly dilated and abnormal. This is probably small bowel. There is some gas in nondilated colon. No pneumoperitoneum is identified. No acute osseous abnormality identified. IMPRESSION: 1. Abnormal bowel gas pattern in the mid abdomen suspicious for small bowel obstruction. See CT Abdomen and Pelvis 05/10/2023 reported separately. 2. No pneumoperitoneum identified.  Lung base atelectasis. Electronically Signed   By: Odessa Fleming M.D.   On: 05/10/2023 06:11   CT Head Wo Contrast  Result Date: 05/10/2023 CLINICAL DATA:  72 year old male with hypertension, fall.  Pain. EXAM: CT HEAD WITHOUT CONTRAST TECHNIQUE: Contiguous axial images were obtained from the base of the skull through the vertex without intravenous contrast. RADIATION DOSE REDUCTION: This exam was performed according to the departmental dose-optimization program which includes automated exposure control, adjustment of the mA and/or kV according to patient size and/or use of iterative reconstruction technique. COMPARISON:  Head CT 11/17/2022. FINDINGS: Brain: Small chronic left side subdural fluid collection is mostly low-density and has not significantly changed since May, measuring 4-5 mm at most levels (series 3, image 23). Smaller contralateral low-density right subdural collection is also possible, 2-3 mm, and also stable. No midline shift or significant intracranial mass effect. No ventriculomegaly. Basilar cisterns  remain patent. No acute intracranial hemorrhage identified. No cortically based acute infarct identified. Chronic small vessel ischemia including Patchy and confluent bilateral white matter  hypodensity, chronic lacunar infarct of the left thalamus. Heterogeneity in the brainstem. Stable gray-white matter differentiation throughout the brain. Vascular: Calcified atherosclerosis at the skull base. No suspicious intracranial vascular hyperdensity. Skull: No fracture identified. Impacted left maxillary tooth incidentally noted. Sinuses/Orbits: Visualized paranasal sinuses and mastoids are stable and well aerated. Other: No orbit or scalp soft tissue injury identified. IMPRESSION: 1. No acute intracranial abnormality or acute traumatic injury identified. 2. Small chronic bilateral subdural fluid collections are suspected and stable since May (4-5 mm on the left, 2-3 mm on the right). 3. Advanced chronic small vessel disease appears stable by CT. Electronically Signed   By: Odessa Fleming M.D.   On: 05/10/2023 06:08    Anti-infectives: Anti-infectives (From admission, onward)    Start     Dose/Rate Route Frequency Ordered Stop   05/11/23 0600  cefTRIAXone (ROCEPHIN) 2 g in sodium chloride 0.9 % 100 mL IVPB        2 g 200 mL/hr over 30 Minutes Intravenous Every 24 hours 05/10/23 0745     05/10/23 2000  metroNIDAZOLE (FLAGYL) IVPB 500 mg        500 mg 100 mL/hr over 60 Minutes Intravenous Every 12 hours 05/10/23 0745     05/10/23 0515  cefTRIAXone (ROCEPHIN) 2 g in sodium chloride 0.9 % 100 mL IVPB        2 g 200 mL/hr over 30 Minutes Intravenous  Once 05/10/23 0510 05/10/23 0615   05/10/23 0515  metroNIDAZOLE (FLAGYL) IVPB 500 mg        500 mg 100 mL/hr over 60 Minutes Intravenous  Once 05/10/23 0510 05/10/23 0646        Assessment/Plan  SBO - Patient still with significant left sided abdominal tenderness, but WBC normalized, Cr downtrending, vital signs are improved with no tachycardia or hypotension, and he passed a small amount of flatus. Needs NG replaced. Will recheck lactate. He is getting 1u PRBCs for Hgb 6.9 this AM. Will plan to check again later today.  ID - rocephin/flagyl FEN  - IVF, NPO VTE - SCDs Foley - none  ARF - Cr down 2.91 from 3.46 Acute on chronic anemia  Hx CVAs CHF with preserved EF HTN HLD Tobacco abuse  I reviewed hospitalist notes, last 24 h vitals and pain scores, last 48 h intake and output, last 24 h labs and trends, and last 24 h imaging results.    LOS: 1 day    Franne Forts, [facility] Surgery 05/11/2023, 8:02 AM Please see Amion for pager number during day hours 7:00am-4:30pm

## 2023-05-12 ENCOUNTER — Inpatient Hospital Stay (HOSPITAL_COMMUNITY): Payer: 59

## 2023-05-12 DIAGNOSIS — K56609 Unspecified intestinal obstruction, unspecified as to partial versus complete obstruction: Secondary | ICD-10-CM | POA: Diagnosis not present

## 2023-05-12 LAB — CBC WITH DIFFERENTIAL/PLATELET
Abs Immature Granulocytes: 0.02 10*3/uL (ref 0.00–0.07)
Basophils Absolute: 0 10*3/uL (ref 0.0–0.1)
Basophils Relative: 0 %
Eosinophils Absolute: 0.1 10*3/uL (ref 0.0–0.5)
Eosinophils Relative: 1 %
HCT: 30.5 % — ABNORMAL LOW (ref 39.0–52.0)
Hemoglobin: 8.1 g/dL — ABNORMAL LOW (ref 13.0–17.0)
Immature Granulocytes: 0 %
Lymphocytes Relative: 13 %
Lymphs Abs: 0.8 10*3/uL (ref 0.7–4.0)
MCH: 18.1 pg — ABNORMAL LOW (ref 26.0–34.0)
MCHC: 26.6 g/dL — ABNORMAL LOW (ref 30.0–36.0)
MCV: 68.1 fL — ABNORMAL LOW (ref 80.0–100.0)
Monocytes Absolute: 0.8 10*3/uL (ref 0.1–1.0)
Monocytes Relative: 14 %
Neutro Abs: 4.2 10*3/uL (ref 1.7–7.7)
Neutrophils Relative %: 72 %
Platelets: 375 10*3/uL (ref 150–400)
RBC: 4.48 MIL/uL (ref 4.22–5.81)
RDW: 20.6 % — ABNORMAL HIGH (ref 11.5–15.5)
WBC: 5.9 10*3/uL (ref 4.0–10.5)
nRBC: 0 % (ref 0.0–0.2)

## 2023-05-12 LAB — PROCALCITONIN: Procalcitonin: 2.11 ng/mL

## 2023-05-12 LAB — BASIC METABOLIC PANEL
Anion gap: 8 (ref 5–15)
BUN: 26 mg/dL — ABNORMAL HIGH (ref 8–23)
CO2: 30 mmol/L (ref 22–32)
Calcium: 8.7 mg/dL — ABNORMAL LOW (ref 8.9–10.3)
Chloride: 102 mmol/L (ref 98–111)
Creatinine, Ser: 1.34 mg/dL — ABNORMAL HIGH (ref 0.61–1.24)
GFR, Estimated: 56 mL/min — ABNORMAL LOW (ref >60)
Glucose, Bld: 86 mg/dL (ref 70–99)
Potassium: 3.6 mmol/L (ref 3.5–5.1)
Sodium: 140 mmol/L (ref 135–145)

## 2023-05-12 LAB — BPAM RBC
Blood Product Expiration Date: 202411192359
ISSUE DATE / TIME: 202410230516
Unit Type and Rh: 5100

## 2023-05-12 LAB — TYPE AND SCREEN
ABO/RH(D): O POS
Antibody Screen: NEGATIVE
Unit division: 0

## 2023-05-12 LAB — MAGNESIUM: Magnesium: 2.4 mg/dL (ref 1.7–2.4)

## 2023-05-12 LAB — PHOSPHORUS: Phosphorus: 1.8 mg/dL — ABNORMAL LOW (ref 2.5–4.6)

## 2023-05-12 LAB — C-REACTIVE PROTEIN: CRP: 13.9 mg/dL — ABNORMAL HIGH (ref <1.0)

## 2023-05-12 LAB — BRAIN NATRIURETIC PEPTIDE: B Natriuretic Peptide: 189 pg/mL — ABNORMAL HIGH (ref 0.0–100.0)

## 2023-05-12 MED ORDER — QUETIAPINE FUMARATE 100 MG PO TABS
100.0000 mg | ORAL_TABLET | Freq: Every day | ORAL | Status: DC
  Administered 2023-05-12 – 2023-05-13 (×2): 100 mg via ORAL
  Filled 2023-05-12 (×2): qty 1

## 2023-05-12 MED ORDER — DEXTROSE IN LACTATED RINGERS 5 % IV SOLN
INTRAVENOUS | Status: DC
  Filled 2023-05-12: qty 1000

## 2023-05-12 MED ORDER — CARVEDILOL 3.125 MG PO TABS
3.1250 mg | ORAL_TABLET | Freq: Two times a day (BID) | ORAL | Status: DC
  Administered 2023-05-12 – 2023-05-15 (×7): 3.125 mg via ORAL
  Filled 2023-05-12 (×8): qty 1

## 2023-05-12 MED ORDER — HYDRALAZINE HCL 20 MG/ML IJ SOLN
10.0000 mg | INTRAMUSCULAR | Status: DC | PRN
  Administered 2023-05-12: 20 mg via INTRAVENOUS
  Filled 2023-05-12: qty 1

## 2023-05-12 NOTE — Progress Notes (Signed)
Subjective/Chief Complaint: Multiple bms, feels back to normal   Objective: Vital signs in last 24 hours: Temp:  [97.7 F (36.5 C)-98.7 F (37.1 C)] 97.8 F (36.6 C) (10/24 0800) Pulse Rate:  [61-74] 61 (10/24 0800) Resp:  [15-25] 15 (10/24 0800) BP: (146-194)/(71-125) 194/76 (10/24 0800) SpO2:  [93 %-96 %] 96 % (10/24 0800)    Intake/Output from previous day: 10/23 0701 - 10/24 0700 In: 368.3 [Blood:368.3] Out: 900 [Urine:900] Intake/Output this shift: No intake/output data recorded.  Ab soft nontender nondistended  Lab Results:  Recent Labs    05/11/23 0853 05/12/23 0549  WBC 7.2 5.9  HGB 8.0* 8.1*  HCT 28.6* 30.5*  PLT 360 375   BMET Recent Labs    05/11/23 0326 05/12/23 0549  NA 141 140  K 3.3* 3.6  CL 95* 102  CO2 33* 30  GLUCOSE 106* 86  BUN 43* 26*  CREATININE 2.91* 1.34*  CALCIUM 8.3* 8.7*   PT/INR No results for input(s): "LABPROT", "INR" in the last 72 hours. ABG No results for input(s): "PHART", "HCO3" in the last 72 hours.  Invalid input(s): "PCO2", "PO2"  Studies/Results: DG Abd 1 View  Result Date: 05/12/2023 CLINICAL DATA:  72 year old male small-bowel obstruction. EXAM: ABDOMEN - 1 VIEW COMPARISON:  CT Abdomen and Pelvis 05/10/2023. Abdominal radiographs yesterday. FINDINGS: Portable AP supine view at 0639 hours. Administered oral contrast is present in the large bowel including the rectum, and some has cleared since yesterday. Stable enteric tube looped in the epigastrium. Bowel gas pattern mildly improved since 05/10/2023. Stable lung bases. Stable visualized osseous structures. IMPRESSION: Bowel gas pattern improved since 05/10/2023. Retained oral contrast in the large bowel. Enteric tube remains in the epigastrium. Electronically Signed   By: Odessa Fleming M.D.   On: 05/12/2023 07:45   DG Abd Portable 1V-Small Bowel Obstruction Protocol-initial, 8 hr delay  Result Date: 05/11/2023 CLINICAL DATA:  Small bowel obstruction. EXAM:  PORTABLE ABDOMEN - 1 VIEW COMPARISON:  Radiograph earlier today FINDINGS: Administered enteric contrast is seen throughout the colon to the level of the rectum. There is improving gaseous small bowel distension centrally. The tip and side port of the enteric tube appear to be below the diaphragm in the stomach. IMPRESSION: Administered enteric contrast throughout the colon to the level of the rectum. Improving gaseous small bowel distension. Findings likely represent resolving bowel obstruction. Electronically Signed   By: Narda Rutherford M.D.   On: 05/11/2023 23:39   DG Abd 1 View  Result Date: 05/11/2023 CLINICAL DATA:  Nasogastric tube placement EXAM: ABDOMEN - 1 VIEW COMPARISON:  Radiograph earlier today FINDINGS: The enteric tube is looped within a large hiatal hernia in the lower thorax. The tip is directed cranially projecting over the lower thorax. Dilated small bowel in the upper abdomen partially included. IMPRESSION: The enteric tube is looped within a large hiatal hernia in the lower thorax. The tip is directed cranially projecting over the lower thorax. Recommend repositioning. Electronically Signed   By: Narda Rutherford M.D.   On: 05/11/2023 14:37   DG Abd Portable 1V  Result Date: 05/11/2023 CLINICAL DATA:  101717 Nausea 101717.  Left lower quadrant pain. EXAM: PORTABLE ABDOMEN - 1 VIEW COMPARISON:  Abdominal radiograph 05/10/2023. CT abdomen/pelvis 05/10/2023. FINDINGS: Persistent dilation of small-bowel loops in the left hemiabdomen, compatible with small-bowel obstruction. IMPRESSION: Persistent small-bowel obstruction. Electronically Signed   By: Orvan Falconer M.D.   On: 05/11/2023 09:19    Anti-infectives: Anti-infectives (From admission, onward)    Start  Dose/Rate Route Frequency Ordered Stop   05/11/23 0600  cefTRIAXone (ROCEPHIN) 2 g in sodium chloride 0.9 % 100 mL IVPB        2 g 200 mL/hr over 30 Minutes Intravenous Every 24 hours 05/10/23 0745     05/10/23 2000   metroNIDAZOLE (FLAGYL) IVPB 500 mg        500 mg 100 mL/hr over 60 Minutes Intravenous Every 12 hours 05/10/23 0745     05/10/23 0515  cefTRIAXone (ROCEPHIN) 2 g in sodium chloride 0.9 % 100 mL IVPB        2 g 200 mL/hr over 30 Minutes Intravenous  Once 05/10/23 0510 05/10/23 0615   05/10/23 0515  metroNIDAZOLE (FLAGYL) IVPB 500 mg        500 mg 100 mL/hr over 60 Minutes Intravenous  Once 05/10/23 0510 05/10/23 0646       Assessment/Plan: SBO - appears to have resolved, cr improving, wbc normal -will clamp ng tube, give clears, remove later if does well -there is not an indicatoin for abx from my standpoint  ID - rocephin/flagyl see above FEN - IVF, clears VTE - SCDs Foley - none   ARF - Cr improving Acute on chronic anemia  Hx CVAs CHF with preserved EF HTN HLD Tobacco abuse  I reviewed hospitalist notes, last 24 h vitals and pain scores, last 48 h intake and output, last 24 h labs and trends, and last 24 h imaging results. Discussed case with Dr Crista Elliot 05/12/2023

## 2023-05-12 NOTE — Evaluation (Signed)
Occupational Therapy Evaluation Patient Details Name: [name] MRN: [id number] DOB: [birth year] Today's Date: 05/12/2023   History of Present Illness Pt is a 72 y/o male admitted 10/22 with 3 days of not feeling well and with L lower quadrant abdominal pain progressing to generalized and severe pain.  Found to have SBO, NGT placed.   PMH: Right hemiparesis secondary to thalamic hemorrhage 07/20/18, chronic back pain, headaches, left shoulder pain, OA, HTN, insomnia, GI bleed, glaucoma R eye, hepatitis C, hiatal hernia with GERD, gout, HLD, ischemic colitis, prostate cancer, syncope, depression, L rotator cuff syndrome, symptomatic anemia causing hypotension    Sugeries: appendectomy, inguinal hernia repair, prostatectomy, small intestine surgery   Clinical Impression   Pt admitted for above, He has some residual LUE deficits and is generally weak compared to his baseline. Pt initially needed Max A for STS but progressed to CGA, PTA he was stand pivoting in/out of w/c. Pending progression with therapy he may be able to reach his baseline while in acute stay but is not ready at this time, needs to consistently complete transfers without physical assist before then. He requires Mod A for LBD and toileting but his other ADLs can be complete with setup assist. OT to continue to follow pt acutely to address deficits and help transition to next level of care. Patient would benefit from post acute Home OT services to help maximize functional independence in natural environment pending progress.       If plan is discharge home, recommend the following: A little help with bathing/dressing/bathroom;Assistance with cooking/housework    Functional Status Assessment  Patient has had a recent decline in their functional status and demonstrates the ability to make significant improvements in function in a reasonable and predictable amount of time.  Equipment Recommendations  None recommended by OT (pt has  rec DME)    Recommendations for Other Services       Precautions / Restrictions Precautions Precautions: Fall Restrictions Weight Bearing Restrictions: No      Mobility Bed Mobility Overal bed mobility: Needs Assistance Bed Mobility: Supine to Sit, Sit to Supine     Supine to sit: Min assist, Used rails, HOB elevated Sit to supine: Contact guard assist, Used rails   General bed mobility comments: cues for pt to use bed rails, light min A to help position into upright sitting    Transfers Overall transfer level: Needs assistance Equipment used: Rolling walker (2 wheels) Transfers: Sit to/from Stand Sit to Stand: Contact guard assist, Max assist           General transfer comment: Pt initially max A for first STS, then progress to CGA but required increased effort and time. Pt declined transfer to recliner as he reports he fell out of it yesterday. Checked with RN and pt never hit the floor he just slid too close to the end making his recliner tip causing a near fall event before staff could assist. Steps at bedside (3 lateral, forwad, and back) with CGA      Balance Overall balance assessment: Needs assistance Sitting-balance support: No upper extremity supported, Feet supported Sitting balance-Leahy Scale: Good Sitting balance - Comments: complete LBB with setup and CGA   Standing balance support: Bilateral upper extremity supported, During functional activity Standing balance-Leahy Scale: Poor Standing balance comment: Reliant on RW                           ADL either performed or  assessed with clinical judgement   ADL Overall ADL's : Needs assistance/impaired Eating/Feeding: Independent;Sitting   Grooming: Sitting;Set up   Upper Body Bathing: Sitting;Set up;Supervision/ safety   Lower Body Bathing: Set up;Sitting/lateral leans;Contact guard assist Lower Body Bathing Details (indicate cue type and reason): encouraged pt to complete without  assist. Upper Body Dressing : Sitting;Set up;Contact guard assist   Lower Body Dressing: Sit to/from stand;Moderate assistance Lower Body Dressing Details (indicate cue type and reason): less assist needed in sitting, can doff/don socks with setup Toilet Transfer: Rolling walker (2 wheels);Contact guard assist;Stand-pivot;BSC/3in1 Statistician Details (indicate cue type and reason): based on STS from EOB Toileting- Clothing Manipulation and Hygiene: Sit to/from stand;Moderate assistance         General ADL Comments: Pt not ambulatory in the last 2 years     Vision         Perception         Praxis         Pertinent Vitals/Pain Pain Assessment Pain Assessment: Faces Faces Pain Scale: Hurts a little bit Pain Location: R leg and L flank Pain Descriptors / Indicators: Sore, Discomfort, Aching Pain Intervention(s): Limited activity within patient's tolerance, Monitored during session     Extremity/Trunk Assessment Upper Extremity Assessment Upper Extremity Assessment: Generalized weakness (Prior R rotator injury, deffered MMT. limited R shoulder flexion)   Lower Extremity Assessment Lower Extremity Assessment: Generalized weakness (R weaker and less coordinated, pt doesn't get to a fully upright position.)   Cervical / Trunk Assessment Cervical / Trunk Assessment: Kyphotic   Communication Communication Communication: No apparent difficulties Cueing Techniques: Verbal cues   Cognition Arousal: Alert Behavior During Therapy: WFL for tasks assessed/performed Overall Cognitive Status: Within Functional Limits for tasks assessed                                       General Comments  VSS on RA, pt reports some dizziness in standing and returned to sitting following fatigue, Bp 185/88 (112) sitting EOB    Exercises     Shoulder Instructions      Home Living Family/patient expects to be discharged to:: Private residence Living Arrangements:  Alone Available Help at Discharge: Friend(s);Family;Available PRN/intermittently;Personal care attendant (PCA 2 hrs w/days and 1 hr w/e's) Type of Home: House Home Access: Level entry;Ramped entrance     Home Layout: One level     Bathroom Shower/Tub: Producer, television/film/video: Standard Bathroom Accessibility: Yes How Accessible: Accessible via wheelchair Home Equipment: BSC/3in1;Wheelchair - power;Grab bars - tub/shower;Hand held shower head;Rolling Walker (2 wheels);Tub bench   Additional Comments: sleeps on the couch      Prior Functioning/Environment Prior Level of Function : Independent/Modified Independent             Mobility Comments: mod I from w/c level with mod I transfers, but with occasional falls.  Pt rides to grocery store in the power chair or by power chair on the bus.  Little assist from family per pt. ADLs Comments: mod I from w/c level.  transfers over shower step to a shower seat fully in the shower, w/c to toilet and fixes food/meals.        OT Problem List: Decreased strength;Impaired balance (sitting and/or standing)      OT Treatment/Interventions: Self-care/ADL training;Balance training;Therapeutic exercise;Therapeutic activities;Patient/family education    OT Goals(Current goals can be found in the care plan section) Acute  Rehab OT Goals Patient Stated Goal: To eventually get back to walking OT Goal Formulation: With patient Time For Goal Achievement: 05/26/23 Potential to Achieve Goals: Good ADL Goals Pt Will Transfer to Toilet: with modified independence;bedside commode;stand pivot transfer Pt Will Perform Toileting - Clothing Manipulation and hygiene: with modified independence;sit to/from stand Pt Will Perform Tub/Shower Transfer: with supervision;Stand pivot transfer;tub bench Pt/caregiver will Perform Home Exercise Program: Increased strength;Both right and left upper extremity;With Supervision;With written HEP provided  OT  Frequency: Min 1X/week    Co-evaluation              AM-PAC OT "6 Clicks" Daily Activity     Outcome Measure Help from another person eating meals?: None Help from another person taking care of personal grooming?: A Little Help from another person toileting, which includes using toliet, bedpan, or urinal?: A Lot Help from another person bathing (including washing, rinsing, drying)?: A Little Help from another person to put on and taking off regular upper body clothing?: A Little Help from another person to put on and taking off regular lower body clothing?: A Lot 6 Click Score: 17   End of Session Equipment Utilized During Treatment: Gait belt;Rolling walker (2 wheels) Nurse Communication: Mobility status  Activity Tolerance: Patient tolerated treatment well Patient left: in bed;with call bell/phone within reach;with bed alarm set  OT Visit Diagnosis: Unsteadiness on feet (R26.81);Other abnormalities of gait and mobility (R26.89);Muscle weakness (generalized) (M62.81)                Time: 1100-1130 OT Time Calculation (min): 30 min Charges:  OT General Charges $OT Visit: 1 Visit OT Evaluation $OT Eval Moderate Complexity: 1 Mod OT Treatments $Self Care/Home Management : 8-22 mins  05/12/2023  AB, OTR/L  Acute Rehabilitation Services  Office: [phone]   Tristan Schroeder 05/12/2023, 12:24 PM

## 2023-05-12 NOTE — Progress Notes (Signed)
NG tube removed at this time per order.  Tolerated well by patient.

## 2023-05-12 NOTE — Progress Notes (Signed)
PROGRESS NOTE                                                                                                                                                                                                             Patient Demographics:    [name], is a 72 y.o. male, DOB - Feb 20, 1951, ONG:[id number]  Outpatient Primary MD for the patient is Loura Back, NP    LOS - 2  Admit date - 05/10/2023    Chief Complaint  Patient presents with   Fall   Hypotension       Brief Narrative (HPI from H&P)   72 y.o. male with medical history significant of hyperlipidemia, CVA, hepatitis C, iron deficiency anemia, small bowel obstruction s/p 2001 resection of bowel, GI bleed, hiatal hernia, and GERD who presents after having a fall at home, has been not feeling well for the last few days had some abdominal pain and nausea vomiting.  In the ER was diagnosed with small bowel obstruction, CT head and C-spine was nonacute and he was admitted to the hospital.  General surgery was consulted.   Subjective:   Patient in bed, appears comfortable, denies any headache, no fever, no chest pain or pressure, no shortness of breath , no abdominal pain, no nausea, has had 4-5 bowel movements overnight. No new focal weakness.   Assessment  & Plan :    Small bowel obstruction in a patient with History of small bowel obstruction and abdominal surgeries in the past. Records note prior history of small bowel obstruction back in 2001 which resulted in patient having multiple abdominal surgeries and resection of bowel.  Being treated conservatively, has NG tube, n.p.o. with IV fluids, much improved on 05/12/2023, has had 4-5 bowel movements overnight, no abdominal pain no nausea, will discuss with general surgery to see if we can advance his diet and clamp his NG.   Dehydration with lactic acidosis, hypotension and AKI.  Likely all due to dehydration from  #1 above, no sepsis, continue to follow cultures, much improved with IV fluids and hydration.    Acute kidney injury - due to above, hydrate and monitor.  Improved.   Iron deficiency anemia acute on chronic.  Some acute fall due to heme dilution from IV fluids.  Getting 1 unit of packed RBC transfusion on 05/11/2023, no signs  of active bleeding, iron deficiency and low normal B12 noted on anemia panel, replace as appropriate.  Fall at home, Right hip pain - Patient presented after having a fall at home.  Noted complaints of right hip pain prior to the fall.  No traumatic injury noted on CT imaging. Follow-up CK,   PT/OT     Hypokalemia  replace   Thrombocytosis  Chronic.  Platelet count elevated at 479, but appears to be around patient baseline. Continue to monitor   Tobacco abuse  Patient reports smoking half a pack of cigarettes per day on average. Nicotine patch     GERD, Hiatal hernia  -Protonix IV       Condition - Guarded  Family Communication  : Devonne Doughty [phone]  on 05/11/2023, 05/12/23  Code Status :  Full  Consults  :  CCS  PUD Prophylaxis : PPI   Procedures  :     CT head.  1. No acute intracranial abnormality or acute traumatic injury identified. 2. Small chronic bilateral subdural fluid collections are suspected and stable since May (4-5 mm on the left, 2-3 mm on the right). 3. Advanced chronic small vessel disease appears stable by CT  CT C-spine.  1. No acute traumatic injury identified in the cervical spine. 2. Bulky cervical spine degeneration superimposed on chronic C5-C6 ankylosis. Chronic mild to moderate cervical spinal stenosis is suspected C3-C4 through C5-C6.  CT abdomen pelvis - 1. Positive for High-grade Small Bowel Obstruction. Discrete transition point is difficult to identify but seems to be beneath the left ventral abdominal wall. Chronic postoperative changes to the abdomen, favor adhesions. 2. Fluid distended stomach, gastric hiatal hernia,  distal esophagus. And a chronic small bowel anastomosis in the right abdomen is also dilated. Large bowel diffusely decompressed. Trace free fluid in the pelvis. No free air or free fluid identified in the abdomen. 3. No superimposed acute traumatic injury identified in the noncontrast abdomen or pelvis. Aortic Atherosclerosis (ICD10-I70.0)      Disposition Plan  :    Status is: Inpatient   DVT Prophylaxis  :  Heparin  heparin injection 5,000 Units Start: 05/11/23 0900 SCDs Start: 05/10/23 0739    Lab Results  Component Value Date   PLT 375 05/12/2023    Diet :  Diet Order             Diet NPO time specified  Diet effective now                    Inpatient Medications  Scheduled Meds:  cyanocobalamin  1,000 mcg Subcutaneous Daily   [START ON 05/14/2023] vitamin B-12  1,000 mcg Oral Daily   diclofenac Sodium  2 g Topical BID   [START ON 05/13/2023] ferrous sulfate  325 mg Oral BID WC   [START ON 05/14/2023] folic acid  1 mg Oral Daily   heparin injection (subcutaneous)  5,000 Units Subcutaneous Q8H   nicotine  14 mg Transdermal Daily   pantoprazole (PROTONIX) IV  40 mg Intravenous Q24H   sodium chloride flush  3 mL Intravenous Q12H   Continuous Infusions:  cefTRIAXone (ROCEPHIN)  IV 2 g (05/12/23 0548)   dextrose 5% lactated ringers     metronidazole 500 mg (05/11/23 2132)   PRN Meds:.acetaminophen **OR** acetaminophen, albuterol, morphine injection, ondansetron (ZOFRAN) IV  Antibiotics  :    Anti-infectives (From admission, onward)    Start     Dose/Rate Route Frequency Ordered Stop   05/11/23 0600  cefTRIAXone (  ROCEPHIN) 2 g in sodium chloride 0.9 % 100 mL IVPB        2 g 200 mL/hr over 30 Minutes Intravenous Every 24 hours 05/10/23 0745     05/10/23 2000  metroNIDAZOLE (FLAGYL) IVPB 500 mg        500 mg 100 mL/hr over 60 Minutes Intravenous Every 12 hours 05/10/23 0745     05/10/23 0515  cefTRIAXone (ROCEPHIN) 2 g in sodium chloride 0.9 % 100 mL IVPB         2 g 200 mL/hr over 30 Minutes Intravenous  Once 05/10/23 0510 05/10/23 0615   05/10/23 0515  metroNIDAZOLE (FLAGYL) IVPB 500 mg        500 mg 100 mL/hr over 60 Minutes Intravenous  Once 05/10/23 0510 05/10/23 0646         Objective:   Vitals:   05/11/23 2055 05/11/23 2125 05/11/23 2325 05/11/23 2359  BP: (!) 146/125 (!) 151/85 (!) 168/92 (!) 156/71  Pulse: 74 66 63 64  Resp: (!) 22 (!) 22 20 (!) 25  Temp: 98.4 F (36.9 C)  98 F (36.7 C) 97.7 F (36.5 C)  TempSrc: Oral  Oral Axillary  SpO2: 94% 93% 95% 93%  Weight:      Height:        Wt Readings from Last 3 Encounters:  05/10/23 93.4 kg  11/17/22 93.4 kg  02/12/22 90.4 kg     Intake/Output Summary (Last 24 hours) at 05/12/2023 0752 Last data filed at 05/12/2023 0548 Gross per 24 hour  Intake --  Output 900 ml  Net -900 ml     Physical Exam  Awake Alert, No new F.N deficits, Normal affect [place].AT,PERRAL Supple Neck, No JVD,   Symmetrical Chest wall movement, Good air movement bilaterally, CTAB RRR,No Gallops,Rubs or new Murmurs,  +ve B.Sounds, Abd Soft, No tenderness,  NG No Cyanosis, Clubbing or edema       Data Review:    Recent Labs  Lab 05/10/23 0504 05/10/23 0853 05/11/23 0326 05/11/23 0853 05/12/23 0549  WBC 14.9*  --  7.6 7.2 5.9  HGB 7.3* 8.1* 6.9* 8.0* 8.1*  HCT 29.1* 30.7* 25.5* 28.6* 30.5*  PLT 479*  --  371 360 375  MCV 68.1*  --  64.9* 66.4* 68.1*  MCH 17.1*  --  17.6* 18.6* 18.1*  MCHC 25.1*  --  27.1* 28.0* 26.6*  RDW 19.9*  --  19.7* 20.8* 20.6*  LYMPHSABS 0.2*  --   --   --  0.8  MONOABS 0.9  --   --   --  0.8  EOSABS 0.0  --   --   --  0.1  BASOSABS 0.0  --   --   --  0.0    Recent Labs  Lab 05/10/23 0501 05/10/23 0504 05/10/23 0701 05/10/23 1103 05/11/23 0326 05/11/23 0853 05/12/23 0549  NA 139 140  --   --  141  --  140  K 3.3* 3.4*  --   --  3.3*  --  3.6  CL 99 100  --   --  95*  --  102  CO2  --  18*  --   --  33*  --  30  ANIONGAP  --  22*  --   --   13  --  8  GLUCOSE 126* 127*  --   --  106*  --  86  BUN 24* 25*  --   --  43*  --  26*  CREATININE 3.60* 3.46*  --   --  2.91*  --  1.34*  AST  --  22  --   --  52*  --   --   ALT  --  11  --   --  55*  --   --   ALKPHOS  --  74  --   --  64  --   --   BILITOT  --  0.6  --   --  0.6  --   --   ALBUMIN  --  3.4*  --   --  3.2*  --   --   CRP  --   --   --   --   --   --  13.9*  DDIMER  --  <0.27  --   --   --   --   --   LATICACIDVEN 11.9*  --  7.0* 3.3*  --  1.4  --   BNP  --   --   --   --   --   --  189.0*  MG  --   --   --   --  2.0  --  2.4  CALCIUM  --  9.7  --   --  8.3*  --  8.7*      Recent Labs  Lab 05/10/23 0501 05/10/23 0504 05/10/23 0701 05/10/23 1103 05/11/23 0326 05/11/23 0853 05/12/23 0549  CRP  --   --   --   --   --   --  13.9*  DDIMER  --  <0.27  --   --   --   --   --   LATICACIDVEN 11.9*  --  7.0* 3.3*  --  1.4  --   BNP  --   --   --   --   --   --  189.0*  MG  --   --   --   --  2.0  --  2.4  CALCIUM  --  9.7  --   --  8.3*  --  8.7*    --------------------------------------------------------------------------------------------------------------- Lab Results  Component Value Date   CHOL 108 06/17/2021   HDL 39.80 06/17/2021   LDLCALC 47 06/17/2021   TRIG 104.0 06/17/2021   CHOLHDL 3 06/17/2021    Lab Results  Component Value Date   HGBA1C 4.7 (L) 07/22/2018   No results for input(s): "TSH", "T4TOTAL", "FREET4", "T3FREE", "THYROIDAB" in the last 72 hours. Recent Labs    05/10/23 0853  VITAMINB12 232  FOLATE 6.7  FERRITIN 5*  TIBC 531*  IRON <5*  RETICCTPCT 1.3   ------------------------------------------------------------------------------------------------------------------ Cardiac Enzymes No results for input(s): "CKMB", "TROPONINI", "MYOGLOBIN" in the last 168 hours.  Invalid input(s): "CK"  Micro Results Recent Results (from the past 240 hour(s))  SARS Coronavirus 2 by RT PCR (hospital order, performed in [facility]  hospital lab) *cepheid single result test* Nasopharyngeal Swab     Status: None   Collection Time: 05/10/23  8:53 AM   Specimen: Nasopharyngeal Swab; Nasal Swab  Result Value Ref Range Status   SARS Coronavirus 2 by RT PCR NEGATIVE NEGATIVE Final    Comment: Performed at [facility], [address], [place], [place] [address]  Urine Culture     Status: None   Collection Time: 05/10/23  4:48 PM   Specimen: Urine, Random  Result Value Ref Range Status   Specimen Description URINE, RANDOM  Final   Special  Requests NONE Reflexed from [id number]  Final   Culture   Final    NO GROWTH Performed at [facility], [address], [place], [place] [address]    Report Status 05/11/2023 FINAL  Final    Radiology Reports DG Abd 1 View  Result Date: 05/12/2023 CLINICAL DATA:  72 year old male small-bowel obstruction. EXAM: ABDOMEN - 1 VIEW COMPARISON:  CT Abdomen and Pelvis 05/10/2023. Abdominal radiographs yesterday. FINDINGS: Portable AP supine view at 0639 hours. Administered oral contrast is present in the large bowel including the rectum, and some has cleared since yesterday. Stable enteric tube looped in the epigastrium. Bowel gas pattern mildly improved since 05/10/2023. Stable lung bases. Stable visualized osseous structures. IMPRESSION: Bowel gas pattern improved since 05/10/2023. Retained oral contrast in the large bowel. Enteric tube remains in the epigastrium. Electronically Signed   By: Odessa Fleming M.D.   On: 05/12/2023 07:45   DG Abd Portable 1V-Small Bowel Obstruction Protocol-initial, 8 hr delay  Result Date: 05/11/2023 CLINICAL DATA:  Small bowel obstruction. EXAM: PORTABLE ABDOMEN - 1 VIEW COMPARISON:  Radiograph earlier today FINDINGS: Administered enteric contrast is seen throughout the colon to the level of the rectum. There is improving gaseous small bowel distension centrally. The tip and side port of the enteric tube appear to be below the diaphragm in the stomach.  IMPRESSION: Administered enteric contrast throughout the colon to the level of the rectum. Improving gaseous small bowel distension. Findings likely represent resolving bowel obstruction. Electronically Signed   By: Narda Rutherford M.D.   On: 05/11/2023 23:39   DG Abd 1 View  Result Date: 05/11/2023 CLINICAL DATA:  Nasogastric tube placement EXAM: ABDOMEN - 1 VIEW COMPARISON:  Radiograph earlier today FINDINGS: The enteric tube is looped within a large hiatal hernia in the lower thorax. The tip is directed cranially projecting over the lower thorax. Dilated small bowel in the upper abdomen partially included. IMPRESSION: The enteric tube is looped within a large hiatal hernia in the lower thorax. The tip is directed cranially projecting over the lower thorax. Recommend repositioning. Electronically Signed   By: Narda Rutherford M.D.   On: 05/11/2023 14:37   DG Abd Portable 1V  Result Date: 05/11/2023 CLINICAL DATA:  101717 Nausea 101717.  Left lower quadrant pain. EXAM: PORTABLE ABDOMEN - 1 VIEW COMPARISON:  Abdominal radiograph 05/10/2023. CT abdomen/pelvis 05/10/2023. FINDINGS: Persistent dilation of small-bowel loops in the left hemiabdomen, compatible with small-bowel obstruction. IMPRESSION: Persistent small-bowel obstruction. Electronically Signed   By: Orvan Falconer M.D.   On: 05/11/2023 09:19   DG Abd Portable 1 View  Result Date: 05/10/2023 CLINICAL DATA:  NG tube placement EXAM: PORTABLE ABDOMEN - 1 VIEW limited for tube placement COMPARISON:  CT 05/10/2023 earlier FINDINGS: NG tube with the tip coiling above the level of the diaphragm but patient has a known hiatal hernia. Along the visualized upper abdomen multiple dilated loops of small bowel are seen as well as some air in the colon on this limited portable semi upright view of the upper abdomen and lower chest. IMPRESSION: Enteric tube presumably in the known hiatal hernia above the diaphragm Electronically Signed   By: Karen Kays  M.D.   On: 05/10/2023 10:11   CT ABDOMEN PELVIS WO CONTRAST  Result Date: 05/10/2023 CLINICAL DATA:  72 year old male with hypertension, fall. Pre-existing abdominal pain for several days. EXAM: CT ABDOMEN AND PELVIS WITHOUT CONTRAST TECHNIQUE: Multidetector CT imaging of the abdomen and pelvis was performed following the standard protocol without IV  contrast. RADIATION DOSE REDUCTION: This exam was performed according to the departmental dose-optimization program which includes automated exposure control, adjustment of the mA and/or kV according to patient size and/or use of iterative reconstruction technique. COMPARISON:  Abdominal radiographs 05/09/2023. CT Abdomen and Pelvis 02/14/2017. FINDINGS: Lower chest: Calcified coronary artery atherosclerosis. Normal heart size. No pericardial or pleural effusion. But fluid distension of the distal thoracic esophagus and moderate size chronic hiatal hernia. Mild lower lobe atelectasis. Hepatobiliary: Negative noncontrast liver and gallbladder. Pancreas: Negative. Spleen: Negative. Adrenals/Urinary Tract: Stable since 2018, negative. Right renal vascular calcification. No convincing nephrolithiasis. Stomach/Bowel: Redundant but decompressed large bowel throughout the abdomen and pelvis. Decompressed terminal ileum and ileocecal valve on coronal image 91. Decompressed distal small bowel loops throughout the right lower quadrant. Fluid distended stomach including hiatal hernia. Fluid distended duodenum and proximal jejunum. Air-fluid containing jejunal loops are dilated up to 5 cm diameter, mostly in the left abdomen. There is a dilated chronic small bowel anastomosis in the right upper quadrant which is somewhat separate. Discrete transition point is difficult to identify but seems to be in the left anterior abdomen along the ventral abdominal wall. Chronic postoperative changes to the abdominal wall. No pneumoperitoneum identified. No free fluid in the abdomen.  Vascular/Lymphatic: Extensive Aortoiliac calcified atherosclerosis. Vascular patency is not evaluated in the absence of IV contrast. Normal caliber abdominal aorta. No lymphadenopathy identified. Reproductive: Stable. Chronic heterogeneity of the right inguinal canal which is nonspecific. Other: Trace free fluid in the pelvis, simple fluid density series 4, image 85. Musculoskeletal: Widespread flowing vertebral endplate osteophytes. Subsequent multilevel lower thoracic interbody ankylosis. Progressed and severe lower lumbar disc and endplate degeneration since 2018. Progressed SI joint ankylosis since 2018. Chronic hip joint degeneration. Chronic left posterior 11th rib fracture. No acute osseous abnormality identified. IMPRESSION: 1. Positive for High-grade Small Bowel Obstruction. Discrete transition point is difficult to identify but seems to be beneath the left ventral abdominal wall. Chronic postoperative changes to the abdomen, favor adhesions. 2. Fluid distended stomach, gastric hiatal hernia, distal esophagus. And a chronic small bowel anastomosis in the right abdomen is also dilated. Large bowel diffusely decompressed. Trace free fluid in the pelvis. No free air or free fluid identified in the abdomen. 3. No superimposed acute traumatic injury identified in the noncontrast abdomen or pelvis. Aortic Atherosclerosis (ICD10-I70.0). Electronically Signed   By: Odessa Fleming M.D.   On: 05/10/2023 06:30   CT Cervical Spine Wo Contrast  Result Date: 05/10/2023 CLINICAL DATA:  72 year old male with hypertension, fall. Pain. EXAM: CT CERVICAL SPINE WITHOUT CONTRAST TECHNIQUE: Multidetector CT imaging of the cervical spine was performed without intravenous contrast. Multiplanar CT image reconstructions were also generated. RADIATION DOSE REDUCTION: This exam was performed according to the departmental dose-optimization program which includes automated exposure control, adjustment of the mA and/or kV according to  patient size and/or use of iterative reconstruction technique. COMPARISON:  Head CT today.  Cervical spine CT 02/12/2020. FINDINGS: Alignment: Chronic straightening of cervical lordosis. Cervicothoracic junction alignment is within normal limits. Bilateral posterior element alignment is within normal limits. Skull base and vertebrae: Visualized skull base is intact. No atlanto-occipital dissociation. C1 and C2 appear chronically degenerated, but intact and aligned. No acute osseous abnormality identified. Soft tissues and spinal canal: No prevertebral fluid or swelling. No visible canal hematoma. Calcified carotid bifurcation atherosclerosis. Negative other visible noncontrast neck soft tissues. Disc levels: Bulky and advanced cervical spine degeneration superimposed on chronic degenerative appearing ankylosis of C5-C6. Bulky disc and endplate disease with  mild to moderate cervical spinal stenosis suspected C3-C4 through C5-C6. Probably not significant changed compared to the 2021 CT. Upper chest: Visible upper thoracic levels appear intact. Upper lungs are clear. There is some retained fluid in the upper thoracic esophagus. IMPRESSION: 1. No acute traumatic injury identified in the cervical spine. 2. Bulky cervical spine degeneration superimposed on chronic C5-C6 ankylosis. Chronic mild to moderate cervical spinal stenosis is suspected C3-C4 through C5-C6. Electronically Signed   By: Odessa Fleming M.D.   On: 05/10/2023 06:19   DG Chest Port 1 View  Result Date: 05/10/2023 CLINICAL DATA:  72 year old male with hypertension, fall. Pain. EXAM: PORTABLE CHEST 1 VIEW COMPARISON:  Portable chest 03/19/2020 and earlier. FINDINGS: Portable AP supine view at 0503 hours. Lower lung volumes. Moderate chronic hiatal hernia. Other mediastinal contours are within normal limits. Visualized tracheal air column is within normal limits. Crowding of lung markings at both lung bases. No pneumothorax, pulmonary edema, consolidation, or  pleural effusion. No acute osseous abnormality identified. IMPRESSION: 1. Low lung volumes with basilar atelectasis. 2. Chronic hiatal hernia. Electronically Signed   By: Odessa Fleming M.D.   On: 05/10/2023 06:15   DG Hip Unilat W or Wo Pelvis 2-3 Views Right  Result Date: 05/10/2023 CLINICAL DATA:  71 year old male with hypertension, fall. Pain. EXAM: DG HIP (WITH OR WITHOUT PELVIS) 2-3V RIGHT COMPARISON:  07/24/2009 pelvis and right hip series. FINDINGS: AP view of the pelvis with AP and cross-table lateral views of the right hip. Femoral heads are normally located. Hip joint spaces remain symmetric. Chronic acetabular degenerative spurring. No acute pelvis fracture identified. Aortoiliac vascular calcifications are parent. Grossly intact proximal left femur. Proximal right femur appears intact. No acute osseous abnormality identified. IMPRESSION: 1. No acute fracture or dislocation identified about the right hip or pelvis. 2. Aortoiliac atherosclerosis. Electronically Signed   By: Odessa Fleming M.D.   On: 05/10/2023 06:14   DG Abd 1 View  Result Date: 05/10/2023 CLINICAL DATA:  72 year old male with three days of abdominal pain. EXAM: ABDOMEN - 1 VIEW COMPARISON:  CT Abdomen and Pelvis 02/14/2017 and earlier. FINDINGS: AP views on 05/09/2023 1000 hours. Evidence of low lung volumes with crowding at both lung bases. Central abdominal conglomeration of gas containing bowel loops appear mildly dilated and abnormal. This is probably small bowel. There is some gas in nondilated colon. No pneumoperitoneum is identified. No acute osseous abnormality identified. IMPRESSION: 1. Abnormal bowel gas pattern in the mid abdomen suspicious for small bowel obstruction. See CT Abdomen and Pelvis 05/10/2023 reported separately. 2. No pneumoperitoneum identified.  Lung base atelectasis. Electronically Signed   By: Odessa Fleming M.D.   On: 05/10/2023 06:11   CT Head Wo Contrast  Result Date: 05/10/2023 CLINICAL DATA:  72 year old male  with hypertension, fall.  Pain. EXAM: CT HEAD WITHOUT CONTRAST TECHNIQUE: Contiguous axial images were obtained from the base of the skull through the vertex without intravenous contrast. RADIATION DOSE REDUCTION: This exam was performed according to the departmental dose-optimization program which includes automated exposure control, adjustment of the mA and/or kV according to patient size and/or use of iterative reconstruction technique. COMPARISON:  Head CT 11/17/2022. FINDINGS: Brain: Small chronic left side subdural fluid collection is mostly low-density and has not significantly changed since May, measuring 4-5 mm at most levels (series 3, image 23). Smaller contralateral low-density right subdural collection is also possible, 2-3 mm, and also stable. No midline shift or significant intracranial mass effect. No ventriculomegaly. Basilar cisterns remain patent. No acute  intracranial hemorrhage identified. No cortically based acute infarct identified. Chronic small vessel ischemia including Patchy and confluent bilateral white matter hypodensity, chronic lacunar infarct of the left thalamus. Heterogeneity in the brainstem. Stable gray-white matter differentiation throughout the brain. Vascular: Calcified atherosclerosis at the skull base. No suspicious intracranial vascular hyperdensity. Skull: No fracture identified. Impacted left maxillary tooth incidentally noted. Sinuses/Orbits: Visualized paranasal sinuses and mastoids are stable and well aerated. Other: No orbit or scalp soft tissue injury identified. IMPRESSION: 1. No acute intracranial abnormality or acute traumatic injury identified. 2. Small chronic bilateral subdural fluid collections are suspected and stable since May (4-5 mm on the left, 2-3 mm on the right). 3. Advanced chronic small vessel disease appears stable by CT. Electronically Signed   By: Odessa Fleming M.D.   On: 05/10/2023 06:08      Signature  -   Susa Raring M.D on 05/12/2023 at 7:52 AM    -  To page go to [email or web address]

## 2023-05-13 DIAGNOSIS — K56609 Unspecified intestinal obstruction, unspecified as to partial versus complete obstruction: Secondary | ICD-10-CM | POA: Diagnosis not present

## 2023-05-13 LAB — C-REACTIVE PROTEIN: CRP: 6.4 mg/dL — ABNORMAL HIGH (ref <1.0)

## 2023-05-13 LAB — BASIC METABOLIC PANEL
Anion gap: 9 (ref 5–15)
BUN: 11 mg/dL (ref 8–23)
CO2: 24 mmol/L (ref 22–32)
Calcium: 8.6 mg/dL — ABNORMAL LOW (ref 8.9–10.3)
Chloride: 104 mmol/L (ref 98–111)
Creatinine, Ser: 0.98 mg/dL (ref 0.61–1.24)
GFR, Estimated: >60 mL/min (ref >60)
Glucose, Bld: 89 mg/dL (ref 70–99)
Potassium: 3.3 mmol/L — ABNORMAL LOW (ref 3.5–5.1)
Sodium: 137 mmol/L (ref 135–145)

## 2023-05-13 LAB — CBC WITH DIFFERENTIAL/PLATELET
Abs Immature Granulocytes: 0.01 10*3/uL (ref 0.00–0.07)
Basophils Absolute: 0 10*3/uL (ref 0.0–0.1)
Basophils Relative: 1 %
Eosinophils Absolute: 0.1 10*3/uL (ref 0.0–0.5)
Eosinophils Relative: 2 %
HCT: 29.2 % — ABNORMAL LOW (ref 39.0–52.0)
Hemoglobin: 7.8 g/dL — ABNORMAL LOW (ref 13.0–17.0)
Immature Granulocytes: 0 %
Lymphocytes Relative: 15 %
Lymphs Abs: 1 10*3/uL (ref 0.7–4.0)
MCH: 18.2 pg — ABNORMAL LOW (ref 26.0–34.0)
MCHC: 26.7 g/dL — ABNORMAL LOW (ref 30.0–36.0)
MCV: 68.1 fL — ABNORMAL LOW (ref 80.0–100.0)
Monocytes Absolute: 0.6 10*3/uL (ref 0.1–1.0)
Monocytes Relative: 9 %
Neutro Abs: 4.9 10*3/uL (ref 1.7–7.7)
Neutrophils Relative %: 73 %
Platelets: 394 10*3/uL (ref 150–400)
RBC: 4.29 MIL/uL (ref 4.22–5.81)
RDW: 20.7 % — ABNORMAL HIGH (ref 11.5–15.5)
WBC: 6.7 10*3/uL (ref 4.0–10.5)
nRBC: 0 % (ref 0.0–0.2)

## 2023-05-13 LAB — BRAIN NATRIURETIC PEPTIDE: B Natriuretic Peptide: 230.9 pg/mL — ABNORMAL HIGH (ref 0.0–100.0)

## 2023-05-13 LAB — MAGNESIUM: Magnesium: 2.1 mg/dL (ref 1.7–2.4)

## 2023-05-13 LAB — PROCALCITONIN: Procalcitonin: 1 ng/mL

## 2023-05-13 LAB — PHOSPHORUS: Phosphorus: 1.5 mg/dL — ABNORMAL LOW (ref 2.5–4.6)

## 2023-05-13 MED ORDER — POTASSIUM CHLORIDE CRYS ER 20 MEQ PO TBCR
40.0000 meq | EXTENDED_RELEASE_TABLET | Freq: Once | ORAL | Status: AC
  Administered 2023-05-13: 40 meq via ORAL
  Filled 2023-05-13: qty 2

## 2023-05-13 MED ORDER — POTASSIUM PHOSPHATES 15 MMOLE/5ML IV SOLN
30.0000 mmol | Freq: Once | INTRAVENOUS | Status: AC
  Administered 2023-05-13: 30 mmol via INTRAVENOUS
  Filled 2023-05-13 (×2): qty 10

## 2023-05-13 NOTE — Care Management Important Message (Signed)
Important Message  Patient Details  Name: [name] MRN: [id number] Date of Birth: [birth year]   Important Message Given:  Yes - Medicare IM     Dorena Bodo 05/13/2023, 1:56 PM

## 2023-05-13 NOTE — TOC Progression Note (Addendum)
Transition of Care [facility]) - Progression Note    Patient Details  Name: [name] MRN: [id number] Date of Birth: [birth year]  Transition of Care [facility]) CM/SW Contact  Jonathon Clement, RN Phone Number: 05/13/2023, 12:35 PM  Clinical Narrative:     Update 4:06 PM CM called Ms [name] back  CM was told she left for the day. Connected me to [name]  Was given an emergency number to call. [phone]  This is Emergency Management number.  Called and left VM that Patient was to DC and to please call back. Weekend RNCM can call Emergency Management number if no call back   CM met with patient bedside to discuss DC plan.  Patient will DC to home where he lives alone.  This is an apartment on ground level. He states that he had left his front door open when he came tp hospital and that the "landlord company" bolted it.  CM has called [employer] (landlord company) [phone] and left a voice mail for [employer], with CM call back information.. Patient states [facility] will be able to unlock it for him to get in. Tentative DC is for Sunday 10/27. CM left that information on VM for Ms [name] so she is aware of to plan.   Per patient's permission, CM called all 3 Daughters listed in patients "Contacts". Two had disconnected numbers and one had a VM that was not set up. CM called Brother, [name]; also with Patient's permission.  [name] stated that he would be able to meet Patient at the apartment when he is dc 'd and that a caregiver goes daily to patient's apartment and cleans and prepares food etc and that caregiver will also meet patient at the apartment. Door will still need to be unlocked by [employer].  Brother [name] will need a call letting him know when patient will DC . [phone]   Home Health PT and OT will be provided by [facility].  AVS has been updated.   Patient will need transportation home   TOC will continue to follow patient  for any additional discharge needs            Expected Discharge Plan and Services                                               Social Determinants of Health (SDOH) Interventions SDOH Screenings   Food Insecurity: No Food Insecurity (05/10/2023)  Housing: Low Risk  (05/10/2023)  Transportation Needs: No Transportation Needs (05/10/2023)  Utilities: Not At Risk (05/10/2023)  Alcohol Screen: Low Risk  (12/16/2021)  Depression (PHQ2-9): Medium Risk (02/12/2022)  Financial Resource Strain: Low Risk  (12/16/2021)  Physical Activity: Inactive (12/16/2021)  Social Connections: Socially Isolated (12/16/2021)  Stress: No Stress Concern Present (12/16/2021)  Tobacco Use: High Risk (05/10/2023)    Readmission Risk Interventions     No data to display

## 2023-05-13 NOTE — Progress Notes (Addendum)
Subjective/Chief Complaint: Having bms  tol liquids no n/v, no ab pain   Objective: Vital signs in last 24 hours: Temp:  [98.2 F (36.8 C)-98.5 F (36.9 C)] 98.4 F (36.9 C) (10/25 0458) Pulse Rate:  [55-69] 56 (10/25 0458) Resp:  [11-28] 11 (10/25 0458) BP: (145-194)/(64-83) 150/64 (10/25 0458) SpO2:  [93 %-98 %] 98 % (10/25 0458) Last BM Date : 05/12/23  Intake/Output from previous day: 10/24 0701 - 10/25 0700 In: 480 [P.O.:480] Out: 1700 [Urine:1700] Intake/Output this shift: No intake/output data recorded.  Ab soft nontender nondistended  Lab Results:  Recent Labs    05/12/23 0549 05/13/23 0305  WBC 5.9 6.7  HGB 8.1* 7.8*  HCT 30.5* 29.2*  PLT 375 394   BMET Recent Labs    05/12/23 0549 05/13/23 0305  NA 140 137  K 3.6 3.3*  CL 102 104  CO2 30 24  GLUCOSE 86 89  BUN 26* 11  CREATININE 1.34* 0.98  CALCIUM 8.7* 8.6*   PT/INR No results for input(s): "LABPROT", "INR" in the last 72 hours. ABG No results for input(s): "PHART", "HCO3" in the last 72 hours.  Invalid input(s): "PCO2", "PO2"  Studies/Results: DG Abd 1 View  Result Date: 05/12/2023 CLINICAL DATA:  72 year old male small-bowel obstruction. EXAM: ABDOMEN - 1 VIEW COMPARISON:  CT Abdomen and Pelvis 05/10/2023. Abdominal radiographs yesterday. FINDINGS: Portable AP supine view at 0639 hours. Administered oral contrast is present in the large bowel including the rectum, and some has cleared since yesterday. Stable enteric tube looped in the epigastrium. Bowel gas pattern mildly improved since 05/10/2023. Stable lung bases. Stable visualized osseous structures. IMPRESSION: Bowel gas pattern improved since 05/10/2023. Retained oral contrast in the large bowel. Enteric tube remains in the epigastrium. Electronically Signed   By: Odessa Fleming M.D.   On: 05/12/2023 07:45   DG Abd Portable 1V-Small Bowel Obstruction Protocol-initial, 8 hr delay  Result Date: 05/11/2023 CLINICAL DATA:  Small bowel  obstruction. EXAM: PORTABLE ABDOMEN - 1 VIEW COMPARISON:  Radiograph earlier today FINDINGS: Administered enteric contrast is seen throughout the colon to the level of the rectum. There is improving gaseous small bowel distension centrally. The tip and side port of the enteric tube appear to be below the diaphragm in the stomach. IMPRESSION: Administered enteric contrast throughout the colon to the level of the rectum. Improving gaseous small bowel distension. Findings likely represent resolving bowel obstruction. Electronically Signed   By: Narda Rutherford M.D.   On: 05/11/2023 23:39   DG Abd 1 View  Result Date: 05/11/2023 CLINICAL DATA:  Nasogastric tube placement EXAM: ABDOMEN - 1 VIEW COMPARISON:  Radiograph earlier today FINDINGS: The enteric tube is looped within a large hiatal hernia in the lower thorax. The tip is directed cranially projecting over the lower thorax. Dilated small bowel in the upper abdomen partially included. IMPRESSION: The enteric tube is looped within a large hiatal hernia in the lower thorax. The tip is directed cranially projecting over the lower thorax. Recommend repositioning. Electronically Signed   By: Narda Rutherford M.D.   On: 05/11/2023 14:37    Anti-infectives: Anti-infectives (From admission, onward)    Start     Dose/Rate Route Frequency Ordered Stop   05/11/23 0600  cefTRIAXone (ROCEPHIN) 2 g in sodium chloride 0.9 % 100 mL IVPB        2 g 200 mL/hr over 30 Minutes Intravenous Every 24 hours 05/10/23 0745     05/10/23 2000  metroNIDAZOLE (FLAGYL) IVPB 500 mg  500 mg 100 mL/hr over 60 Minutes Intravenous Every 12 hours 05/10/23 0745     05/10/23 0515  cefTRIAXone (ROCEPHIN) 2 g in sodium chloride 0.9 % 100 mL IVPB        2 g 200 mL/hr over 30 Minutes Intravenous  Once 05/10/23 0510 05/10/23 0615   05/10/23 0515  metroNIDAZOLE (FLAGYL) IVPB 500 mg        500 mg 100 mL/hr over 60 Minutes Intravenous  Once 05/10/23 0510 05/10/23 0646        Assessment/Plan: SBO - soft diet -can dc home if tolerates -there is not an indication for abx from my standpoint -will sign off as he will be discharged later, call back if needed   ID - rocephin/flagyl see above FEN -soft VTE - SCDs Foley - none   ARF - Cr normal Acute on chronic anemia  Hx CVAs CHF with preserved EF HTN HLD Tobacco abuse  I reviewed hospitalist notes, last 24 h vitals and pain scores, last 48 h intake and output, last 24 h labs and trends, and last 24 h imaging results.    Emelia Loron 05/13/2023

## 2023-05-13 NOTE — Progress Notes (Signed)
Physical Therapy Treatment Patient Details Name: [name] MRN: [id number] DOB: [birth year] Today's Date: 05/13/2023   History of Present Illness Pt is a 72 y/o male admitted 10/22 with 3 days of not feeling well and with L lower quadrant abdominal pain progressing to generalized and severe pain.  Found to have SBO, NGT placed.   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 Pt required min assist bed mobility and demo good sitting balance. Reported need for BM. Declined BSC and requested bed pan. Assisted NT with rolling and bed linen change after bed pan use. Pt positioned in R sidelying at end of session.    If plan is discharge home, recommend the following: Assist for transportation;Assistance with cooking/housework   Can travel by private vehicle        Equipment Recommendations  Other (comment) (power chair check up or new)    Recommendations for Other Services       Precautions / Restrictions Precautions Precautions: Fall Restrictions Weight Bearing Restrictions: No     Mobility  Bed Mobility Overal bed mobility: Needs Assistance Bed Mobility: Rolling, Supine to Sit, Sit to Supine Rolling: Min assist   Supine to sit: Min assist, Used rails, HOB elevated Sit to supine: Used rails, Min assist        Transfers                   General transfer comment: Pt declining OOB.    Ambulation/Gait                   Stairs             Wheelchair Mobility     Tilt Bed    Modified Rankin (Stroke Patients Only)       Balance  Overall balance assessment: Needs assistance Sitting-balance support: No upper extremity supported, Feet supported Sitting balance-Leahy Scale: Good                                      Cognition Arousal: Alert Behavior During Therapy: WFL for tasks assessed/performed Overall Cognitive Status: Within Functional Limits for tasks assessed                                          Exercises General Exercises - Lower Extremity Ankle Circles/Pumps: AROM, Both, 10 reps, Supine Heel Slides: AROM, Right, Left, 10 reps, Supine    General Comments General comments (skin integrity, edema, etc.): VSS on RA      Pertinent Vitals/Pain Pain Assessment Pain Assessment: Faces Faces Pain Scale: Hurts a little bit Pain Location: generalized Pain Descriptors / Indicators: Discomfort, Grimacing, Sore Pain Intervention(s): Monitored during session, Repositioned, Limited activity within patient's tolerance    Home Living                          Prior Function            PT  Goals (current goals can now be found in the care plan section) Acute Rehab PT Goals Patient Stated Goal: home Progress towards PT goals: Progressing toward goals    Frequency    Min 1X/week      PT Plan      Co-evaluation              AM-PAC PT "6 Clicks" Mobility   Outcome Measure  Help needed turning from your back to your side while in a flat bed without using bedrails?: A Little Help needed moving from lying on your back to sitting on the side of a flat bed without using bedrails?: A Little Help needed moving to and from a bed to a chair (including a wheelchair)?: A Little Help needed standing up from a chair using your arms (e.g., wheelchair or bedside chair)?: A Little   Help needed climbing 3-5 steps with a railing? : Total 6 Click Score: 13    End of Session   Activity Tolerance: Patient limited by pain Patient left: in bed;with call bell/phone  within reach;with bed alarm set Nurse Communication: Mobility status PT Visit Diagnosis: Other abnormalities of gait and mobility (R26.89);Muscle weakness (generalized) (M62.81);Pain     Time: 1610-9604 PT Time Calculation (min) (ACUTE ONLY): 28 min  Charges:    $Therapeutic Exercise: 8-22 mins $Therapeutic Activity: 8-22 mins PT General Charges $$ ACUTE PT VISIT: 1 Visit                     Ferd Glassing., PT  Office # [phone]    Ilda Foil 05/13/2023, 12:16 PM

## 2023-05-13 NOTE — Progress Notes (Signed)
PROGRESS NOTE                                                                                                                                                                                                             Patient Demographics:    [name], is a 72 y.o. male, DOB - 04/27/1951, RUE:[id number]  Outpatient Primary MD for the patient is Loura Back, NP    LOS - 3  Admit date - 05/10/2023    Chief Complaint  Patient presents with   Fall   Hypotension       Brief Narrative (HPI from H&P)   72 y.o. male with medical history significant of hyperlipidemia, CVA, hepatitis C, iron deficiency anemia, small bowel obstruction s/p 2001 resection of bowel, GI bleed, hiatal hernia, and GERD who presents after having a fall at home, has been not feeling well for the last few days had some abdominal pain and nausea vomiting.  In the ER was diagnosed with small bowel obstruction, CT head and C-spine was nonacute and he was admitted to the hospital.  General surgery was consulted.   Subjective:   In bed appears to be in no distress denies any headache, no chest or abdominal pain, no nausea, having bowel movements.  Feels much better.   Assessment  & Plan :    Small bowel obstruction in a patient with History of small bowel obstruction and abdominal surgeries in the past. Records note prior history of small bowel obstruction back in 2001 which resulted in patient having multiple abdominal surgeries and resection of bowel.  Being treated conservatively, has NG tube, n.p.o. with IV fluids, much improved on 05/12/2023, bowel movements, no nausea or abdominal pain, tolerated clear liquid diet on 05/12/2023, await general surgery to advance diet, continues to improve.   Dehydration with lactic acidosis, hypotension and AKI.  Likely all due to dehydration from #1 above, no sepsis, continue to follow cultures, much improved with IV  fluids and hydration.    Acute kidney injury - due to above, hydrate and monitor.  Improved.   Iron deficiency anemia acute on chronic.  Some acute fall due to heme dilution from IV fluids.  Getting 1 unit of packed RBC transfusion on 05/11/2023, no signs of active bleeding, iron deficiency and low normal B12 noted on anemia panel, replace as appropriate.  Fall at home, Right hip pain - Patient presented after having a fall at home.  Noted complaints of right hip pain prior to the fall.  No traumatic injury noted on CT imaging. Follow-up CK,   PT/OT     Hypokalemia & hypophosphatemia replaced   Thrombocytosis  Chronic.  Platelet count elevated at 479, but appears to be around patient baseline. Continue to monitor   Tobacco abuse  Patient reports smoking half a pack of cigarettes per day on average. Nicotine patch     GERD, Hiatal hernia  -Protonix IV       Condition - Guarded  Family Communication  : Devonne Doughty [phone]  on 05/11/2023, 05/12/23  Code Status :  Full  Consults  :  CCS  PUD Prophylaxis : PPI   Procedures  :     CT head.  1. No acute intracranial abnormality or acute traumatic injury identified. 2. Small chronic bilateral subdural fluid collections are suspected and stable since May (4-5 mm on the left, 2-3 mm on the right). 3. Advanced chronic small vessel disease appears stable by CT  CT C-spine.  1. No acute traumatic injury identified in the cervical spine. 2. Bulky cervical spine degeneration superimposed on chronic C5-C6 ankylosis. Chronic mild to moderate cervical spinal stenosis is suspected C3-C4 through C5-C6.  CT abdomen pelvis - 1. Positive for High-grade Small Bowel Obstruction. Discrete transition point is difficult to identify but seems to be beneath the left ventral abdominal wall. Chronic postoperative changes to the abdomen, favor adhesions. 2. Fluid distended stomach, gastric hiatal hernia, distal esophagus. And a chronic small bowel  anastomosis in the right abdomen is also dilated. Large bowel diffusely decompressed. Trace free fluid in the pelvis. No free air or free fluid identified in the abdomen. 3. No superimposed acute traumatic injury identified in the noncontrast abdomen or pelvis. Aortic Atherosclerosis (ICD10-I70.0)      Disposition Plan  :    Status is: Inpatient   DVT Prophylaxis  :  Heparin  heparin injection 5,000 Units Start: 05/11/23 0900 SCDs Start: 05/10/23 0739    Lab Results  Component Value Date   PLT 394 05/13/2023    Diet :  Diet Order             Diet clear liquid Fluid consistency: Thin  Diet effective now                    Inpatient Medications  Scheduled Meds:  carvedilol  3.125 mg Oral BID WC   cyanocobalamin  1,000 mcg Subcutaneous Daily   [START ON 05/14/2023] vitamin B-12  1,000 mcg Oral Daily   diclofenac Sodium  2 g Topical BID   ferrous sulfate  325 mg Oral BID WC   [START ON 05/14/2023] folic acid  1 mg Oral Daily   heparin injection (subcutaneous)  5,000 Units Subcutaneous Q8H   nicotine  14 mg Transdermal Daily   pantoprazole (PROTONIX) IV  40 mg Intravenous Q24H   QUEtiapine  100 mg Oral QHS   sodium chloride flush  3 mL Intravenous Q12H   Continuous Infusions:  cefTRIAXone (ROCEPHIN)  IV 2 g (05/13/23 0622)   metronidazole 500 mg (05/12/23 2030)   potassium PHOSPHATE IVPB (in mmol)     PRN Meds:.acetaminophen **OR** acetaminophen, albuterol, hydrALAZINE, morphine injection, ondansetron (ZOFRAN) IV  Antibiotics  :    Anti-infectives (From admission, onward)    Start     Dose/Rate Route Frequency Ordered Stop   05/11/23 0600  cefTRIAXone (ROCEPHIN) 2 g in sodium chloride 0.9 % 100 mL IVPB        2 g 200 mL/hr over 30 Minutes Intravenous Every 24 hours 05/10/23 0745     05/10/23 2000  metroNIDAZOLE (FLAGYL) IVPB 500 mg        500 mg 100 mL/hr over 60 Minutes Intravenous Every 12 hours 05/10/23 0745     05/10/23 0515  cefTRIAXone (ROCEPHIN) 2 g  in sodium chloride 0.9 % 100 mL IVPB        2 g 200 mL/hr over 30 Minutes Intravenous  Once 05/10/23 0510 05/10/23 0615   05/10/23 0515  metroNIDAZOLE (FLAGYL) IVPB 500 mg        500 mg 100 mL/hr over 60 Minutes Intravenous  Once 05/10/23 0510 05/10/23 0646         Objective:   Vitals:   05/12/23 2200 05/13/23 0000 05/13/23 0400 05/13/23 0458  BP: (!) 165/70 (!) 145/72 (!) 153/64 (!) 150/64  Pulse: 61 (!) 55 (!) 58 (!) 56  Resp: (!) 21 18 (!) 23 11  Temp:    98.4 F (36.9 C)  TempSrc:    Axillary  SpO2: 98% 93% 96% 98%  Weight:      Height:        Wt Readings from Last 3 Encounters:  05/10/23 93.4 kg  11/17/22 93.4 kg  02/12/22 90.4 kg     Intake/Output Summary (Last 24 hours) at 05/13/2023 0829 Last data filed at 05/13/2023 0200 Gross per 24 hour  Intake 480 ml  Output 1700 ml  Net -1220 ml     Physical Exam  Awake Alert, No new F.N deficits, Normal affect [place].AT,PERRAL Supple Neck, No JVD,   Symmetrical Chest wall movement, Good air movement bilaterally, CTAB RRR,No Gallops,Rubs or new Murmurs,  +ve B.Sounds, Abd Soft, No tenderness,    No Cyanosis, Clubbing or edema       Data Review:    Recent Labs  Lab 05/10/23 0504 05/10/23 0853 05/11/23 0326 05/11/23 0853 05/12/23 0549 05/13/23 0305  WBC 14.9*  --  7.6 7.2 5.9 6.7  HGB 7.3* 8.1* 6.9* 8.0* 8.1* 7.8*  HCT 29.1* 30.7* 25.5* 28.6* 30.5* 29.2*  PLT 479*  --  371 360 375 394  MCV 68.1*  --  64.9* 66.4* 68.1* 68.1*  MCH 17.1*  --  17.6* 18.6* 18.1* 18.2*  MCHC 25.1*  --  27.1* 28.0* 26.6* 26.7*  RDW 19.9*  --  19.7* 20.8* 20.6* 20.7*  LYMPHSABS 0.2*  --   --   --  0.8 1.0  MONOABS 0.9  --   --   --  0.8 0.6  EOSABS 0.0  --   --   --  0.1 0.1  BASOSABS 0.0  --   --   --  0.0 0.0    Recent Labs  Lab 05/10/23 0501 05/10/23 0504 05/10/23 0701 05/10/23 1103 05/11/23 0326 05/11/23 0853 05/12/23 0549 05/13/23 0305  NA 139 140  --   --  141  --  140 137  K 3.3* 3.4*  --   --  3.3*  --   3.6 3.3*  CL 99 100  --   --  95*  --  102 104  CO2  --  18*  --   --  33*  --  30 24  ANIONGAP  --  22*  --   --  13  --  8 9  GLUCOSE 126* 127*  --   --  106*  --  86 89  BUN 24* 25*  --   --  43*  --  26* 11  CREATININE 3.60* 3.46*  --   --  2.91*  --  1.34* 0.98  AST  --  22  --   --  52*  --   --   --   ALT  --  11  --   --  55*  --   --   --   ALKPHOS  --  74  --   --  64  --   --   --   BILITOT  --  0.6  --   --  0.6  --   --   --   ALBUMIN  --  3.4*  --   --  3.2*  --   --   --   CRP  --   --   --   --   --   --  13.9* 6.4*  DDIMER  --  <0.27  --   --   --   --   --   --   PROCALCITON  --   --   --   --   --   --  2.11 1.00  LATICACIDVEN 11.9*  --  7.0* 3.3*  --  1.4  --   --   BNP  --   --   --   --   --   --  189.0* 230.9*  MG  --   --   --   --  2.0  --  2.4 2.1  CALCIUM  --  9.7  --   --  8.3*  --  8.7* 8.6*      Recent Labs  Lab 05/10/23 0501 05/10/23 0504 05/10/23 0701 05/10/23 1103 05/11/23 0326 05/11/23 0853 05/12/23 0549 05/13/23 0305  CRP  --   --   --   --   --   --  13.9* 6.4*  DDIMER  --  <0.27  --   --   --   --   --   --   PROCALCITON  --   --   --   --   --   --  2.11 1.00  LATICACIDVEN 11.9*  --  7.0* 3.3*  --  1.4  --   --   BNP  --   --   --   --   --   --  189.0* 230.9*  MG  --   --   --   --  2.0  --  2.4 2.1  CALCIUM  --  9.7  --   --  8.3*  --  8.7* 8.6*    --------------------------------------------------------------------------------------------------------------- Lab Results  Component Value Date   CHOL 108 06/17/2021   HDL 39.80 06/17/2021   LDLCALC 47 06/17/2021   TRIG 104.0 06/17/2021   CHOLHDL 3 06/17/2021    Lab Results  Component Value Date   HGBA1C 4.7 (L) 07/22/2018   No results for input(s): "TSH", "T4TOTAL", "FREET4", "T3FREE", "THYROIDAB" in the last 72 hours. Recent Labs    05/10/23 0853  VITAMINB12 232  FOLATE 6.7  FERRITIN 5*  TIBC 531*  IRON <5*  RETICCTPCT 1.3    ------------------------------------------------------------------------------------------------------------------ Cardiac Enzymes No results for input(s): "CKMB", "TROPONINI", "MYOGLOBIN" in the last 168 hours.  Invalid input(s): "CK"  Micro Results Recent Results (from the past 240 hour(s))  SARS Coronavirus 2 by RT PCR (hospital order, performed in [facility] lab) *cepheid single result test* Nasopharyngeal Swab     Status: None   Collection Time: 05/10/23  8:53 AM   Specimen: Nasopharyngeal Swab; Nasal Swab  Result Value Ref Range Status   SARS Coronavirus 2 by RT PCR NEGATIVE NEGATIVE Final    Comment: Performed at [facility], [address] [place], [place] [address]  Urine Culture     Status: None   Collection Time: 05/10/23  4:48 PM   Specimen: Urine, Random  Result Value Ref Range Status   Specimen Description URINE, RANDOM  Final   Special Requests NONE Reflexed from [id number]  Final   Culture   Final    NO GROWTH Performed at [facility], [address] [place], [place] [address]    Report Status 05/11/2023 FINAL  Final    Radiology Reports DG Abd 1 View  Result Date: 05/12/2023 CLINICAL DATA:  72 year old male small-bowel obstruction. EXAM: ABDOMEN - 1 VIEW COMPARISON:  CT Abdomen and Pelvis 05/10/2023. Abdominal radiographs yesterday. FINDINGS: Portable AP supine view at 0639 hours. Administered oral contrast is present in the large bowel including the rectum, and some has cleared since yesterday. Stable enteric tube looped in the epigastrium. Bowel gas pattern mildly improved since 05/10/2023. Stable lung bases. Stable visualized osseous structures. IMPRESSION: Bowel gas pattern improved since 05/10/2023. Retained oral contrast in the large bowel. Enteric tube remains in the epigastrium. Electronically Signed   By: Odessa Fleming M.D.   On: 05/12/2023 07:45   DG Abd Portable 1V-Small Bowel Obstruction Protocol-initial, 8 hr delay  Result Date:  05/11/2023 CLINICAL DATA:  Small bowel obstruction. EXAM: PORTABLE ABDOMEN - 1 VIEW COMPARISON:  Radiograph earlier today FINDINGS: Administered enteric contrast is seen throughout the colon to the level of the rectum. There is improving gaseous small bowel distension centrally. The tip and side port of the enteric tube appear to be below the diaphragm in the stomach. IMPRESSION: Administered enteric contrast throughout the colon to the level of the rectum. Improving gaseous small bowel distension. Findings likely represent resolving bowel obstruction. Electronically Signed   By: Narda Rutherford M.D.   On: 05/11/2023 23:39   DG Abd 1 View  Result Date: 05/11/2023 CLINICAL DATA:  Nasogastric tube placement EXAM: ABDOMEN - 1 VIEW COMPARISON:  Radiograph earlier today FINDINGS: The enteric tube is looped within a large hiatal hernia in the lower thorax. The tip is directed cranially projecting over the lower thorax. Dilated small bowel in the upper abdomen partially included. IMPRESSION: The enteric tube is looped within a large hiatal hernia in the lower thorax. The tip is directed cranially projecting over the lower thorax. Recommend repositioning. Electronically Signed   By: Narda Rutherford M.D.   On: 05/11/2023 14:37   DG Abd Portable 1V  Result Date: 05/11/2023 CLINICAL DATA:  101717 Nausea 101717.  Left lower quadrant pain. EXAM: PORTABLE ABDOMEN - 1 VIEW COMPARISON:  Abdominal radiograph 05/10/2023. CT abdomen/pelvis 05/10/2023. FINDINGS: Persistent dilation of small-bowel loops in the left hemiabdomen, compatible with small-bowel obstruction. IMPRESSION: Persistent small-bowel obstruction. Electronically Signed   By: Orvan Falconer M.D.   On: 05/11/2023 09:19   DG Abd Portable 1 View  Result Date: 05/10/2023 CLINICAL DATA:  NG tube placement EXAM: PORTABLE ABDOMEN - 1 VIEW limited for tube placement COMPARISON:  CT 05/10/2023 earlier FINDINGS: NG tube with the tip coiling above the level of  the diaphragm but patient has a known hiatal hernia. Along the visualized upper abdomen multiple dilated  loops of small bowel are seen as well as some air in the colon on this limited portable semi upright view of the upper abdomen and lower chest. IMPRESSION: Enteric tube presumably in the known hiatal hernia above the diaphragm Electronically Signed   By: Karen Kays M.D.   On: 05/10/2023 10:11   CT ABDOMEN PELVIS WO CONTRAST  Result Date: 05/10/2023 CLINICAL DATA:  72 year old male with hypertension, fall. Pre-existing abdominal pain for several days. EXAM: CT ABDOMEN AND PELVIS WITHOUT CONTRAST TECHNIQUE: Multidetector CT imaging of the abdomen and pelvis was performed following the standard protocol without IV contrast. RADIATION DOSE REDUCTION: This exam was performed according to the departmental dose-optimization program which includes automated exposure control, adjustment of the mA and/or kV according to patient size and/or use of iterative reconstruction technique. COMPARISON:  Abdominal radiographs 05/09/2023. CT Abdomen and Pelvis 02/14/2017. FINDINGS: Lower chest: Calcified coronary artery atherosclerosis. Normal heart size. No pericardial or pleural effusion. But fluid distension of the distal thoracic esophagus and moderate size chronic hiatal hernia. Mild lower lobe atelectasis. Hepatobiliary: Negative noncontrast liver and gallbladder. Pancreas: Negative. Spleen: Negative. Adrenals/Urinary Tract: Stable since 2018, negative. Right renal vascular calcification. No convincing nephrolithiasis. Stomach/Bowel: Redundant but decompressed large bowel throughout the abdomen and pelvis. Decompressed terminal ileum and ileocecal valve on coronal image 91. Decompressed distal small bowel loops throughout the right lower quadrant. Fluid distended stomach including hiatal hernia. Fluid distended duodenum and proximal jejunum. Air-fluid containing jejunal loops are dilated up to 5 cm diameter, mostly in  the left abdomen. There is a dilated chronic small bowel anastomosis in the right upper quadrant which is somewhat separate. Discrete transition point is difficult to identify but seems to be in the left anterior abdomen along the ventral abdominal wall. Chronic postoperative changes to the abdominal wall. No pneumoperitoneum identified. No free fluid in the abdomen. Vascular/Lymphatic: Extensive Aortoiliac calcified atherosclerosis. Vascular patency is not evaluated in the absence of IV contrast. Normal caliber abdominal aorta. No lymphadenopathy identified. Reproductive: Stable. Chronic heterogeneity of the right inguinal canal which is nonspecific. Other: Trace free fluid in the pelvis, simple fluid density series 4, image 85. Musculoskeletal: Widespread flowing vertebral endplate osteophytes. Subsequent multilevel lower thoracic interbody ankylosis. Progressed and severe lower lumbar disc and endplate degeneration since 2018. Progressed SI joint ankylosis since 2018. Chronic hip joint degeneration. Chronic left posterior 11th rib fracture. No acute osseous abnormality identified. IMPRESSION: 1. Positive for High-grade Small Bowel Obstruction. Discrete transition point is difficult to identify but seems to be beneath the left ventral abdominal wall. Chronic postoperative changes to the abdomen, favor adhesions. 2. Fluid distended stomach, gastric hiatal hernia, distal esophagus. And a chronic small bowel anastomosis in the right abdomen is also dilated. Large bowel diffusely decompressed. Trace free fluid in the pelvis. No free air or free fluid identified in the abdomen. 3. No superimposed acute traumatic injury identified in the noncontrast abdomen or pelvis. Aortic Atherosclerosis (ICD10-I70.0). Electronically Signed   By: Odessa Fleming M.D.   On: 05/10/2023 06:30   CT Cervical Spine Wo Contrast  Result Date: 05/10/2023 CLINICAL DATA:  72 year old male with hypertension, fall. Pain. EXAM: CT CERVICAL SPINE  WITHOUT CONTRAST TECHNIQUE: Multidetector CT imaging of the cervical spine was performed without intravenous contrast. Multiplanar CT image reconstructions were also generated. RADIATION DOSE REDUCTION: This exam was performed according to the departmental dose-optimization program which includes automated exposure control, adjustment of the mA and/or kV according to patient size and/or use of iterative reconstruction technique. COMPARISON:  Head  CT today.  Cervical spine CT 02/12/2020. FINDINGS: Alignment: Chronic straightening of cervical lordosis. Cervicothoracic junction alignment is within normal limits. Bilateral posterior element alignment is within normal limits. Skull base and vertebrae: Visualized skull base is intact. No atlanto-occipital dissociation. C1 and C2 appear chronically degenerated, but intact and aligned. No acute osseous abnormality identified. Soft tissues and spinal canal: No prevertebral fluid or swelling. No visible canal hematoma. Calcified carotid bifurcation atherosclerosis. Negative other visible noncontrast neck soft tissues. Disc levels: Bulky and advanced cervical spine degeneration superimposed on chronic degenerative appearing ankylosis of C5-C6. Bulky disc and endplate disease with mild to moderate cervical spinal stenosis suspected C3-C4 through C5-C6. Probably not significant changed compared to the 2021 CT. Upper chest: Visible upper thoracic levels appear intact. Upper lungs are clear. There is some retained fluid in the upper thoracic esophagus. IMPRESSION: 1. No acute traumatic injury identified in the cervical spine. 2. Bulky cervical spine degeneration superimposed on chronic C5-C6 ankylosis. Chronic mild to moderate cervical spinal stenosis is suspected C3-C4 through C5-C6. Electronically Signed   By: Odessa Fleming M.D.   On: 05/10/2023 06:19   DG Chest Port 1 View  Result Date: 05/10/2023 CLINICAL DATA:  72 year old male with hypertension, fall. Pain. EXAM: PORTABLE  CHEST 1 VIEW COMPARISON:  Portable chest 03/19/2020 and earlier. FINDINGS: Portable AP supine view at 0503 hours. Lower lung volumes. Moderate chronic hiatal hernia. Other mediastinal contours are within normal limits. Visualized tracheal air column is within normal limits. Crowding of lung markings at both lung bases. No pneumothorax, pulmonary edema, consolidation, or pleural effusion. No acute osseous abnormality identified. IMPRESSION: 1. Low lung volumes with basilar atelectasis. 2. Chronic hiatal hernia. Electronically Signed   By: Odessa Fleming M.D.   On: 05/10/2023 06:15   DG Hip Unilat W or Wo Pelvis 2-3 Views Right  Result Date: 05/10/2023 CLINICAL DATA:  72 year old male with hypertension, fall. Pain. EXAM: DG HIP (WITH OR WITHOUT PELVIS) 2-3V RIGHT COMPARISON:  07/24/2009 pelvis and right hip series. FINDINGS: AP view of the pelvis with AP and cross-table lateral views of the right hip. Femoral heads are normally located. Hip joint spaces remain symmetric. Chronic acetabular degenerative spurring. No acute pelvis fracture identified. Aortoiliac vascular calcifications are parent. Grossly intact proximal left femur. Proximal right femur appears intact. No acute osseous abnormality identified. IMPRESSION: 1. No acute fracture or dislocation identified about the right hip or pelvis. 2. Aortoiliac atherosclerosis. Electronically Signed   By: Odessa Fleming M.D.   On: 05/10/2023 06:14   DG Abd 1 View  Result Date: 05/10/2023 CLINICAL DATA:  72 year old male with three days of abdominal pain. EXAM: ABDOMEN - 1 VIEW COMPARISON:  CT Abdomen and Pelvis 02/14/2017 and earlier. FINDINGS: AP views on 05/09/2023 1000 hours. Evidence of low lung volumes with crowding at both lung bases. Central abdominal conglomeration of gas containing bowel loops appear mildly dilated and abnormal. This is probably small bowel. There is some gas in nondilated colon. No pneumoperitoneum is identified. No acute osseous abnormality  identified. IMPRESSION: 1. Abnormal bowel gas pattern in the mid abdomen suspicious for small bowel obstruction. See CT Abdomen and Pelvis 05/10/2023 reported separately. 2. No pneumoperitoneum identified.  Lung base atelectasis. Electronically Signed   By: Odessa Fleming M.D.   On: 05/10/2023 06:11   CT Head Wo Contrast  Result Date: 05/10/2023 CLINICAL DATA:  72 year old male with hypertension, fall.  Pain. EXAM: CT HEAD WITHOUT CONTRAST TECHNIQUE: Contiguous axial images were obtained from the base of the skull through  the vertex without intravenous contrast. RADIATION DOSE REDUCTION: This exam was performed according to the departmental dose-optimization program which includes automated exposure control, adjustment of the mA and/or kV according to patient size and/or use of iterative reconstruction technique. COMPARISON:  Head CT 11/17/2022. FINDINGS: Brain: Small chronic left side subdural fluid collection is mostly low-density and has not significantly changed since May, measuring 4-5 mm at most levels (series 3, image 23). Smaller contralateral low-density right subdural collection is also possible, 2-3 mm, and also stable. No midline shift or significant intracranial mass effect. No ventriculomegaly. Basilar cisterns remain patent. No acute intracranial hemorrhage identified. No cortically based acute infarct identified. Chronic small vessel ischemia including Patchy and confluent bilateral white matter hypodensity, chronic lacunar infarct of the left thalamus. Heterogeneity in the brainstem. Stable gray-white matter differentiation throughout the brain. Vascular: Calcified atherosclerosis at the skull base. No suspicious intracranial vascular hyperdensity. Skull: No fracture identified. Impacted left maxillary tooth incidentally noted. Sinuses/Orbits: Visualized paranasal sinuses and mastoids are stable and well aerated. Other: No orbit or scalp soft tissue injury identified. IMPRESSION: 1. No acute  intracranial abnormality or acute traumatic injury identified. 2. Small chronic bilateral subdural fluid collections are suspected and stable since May (4-5 mm on the left, 2-3 mm on the right). 3. Advanced chronic small vessel disease appears stable by CT. Electronically Signed   By: Odessa Fleming M.D.   On: 05/10/2023 06:08      Signature  -   Susa Raring M.D on 05/13/2023 at 8:29 AM   -  To page go to [email or web address]

## 2023-05-13 NOTE — Plan of Care (Signed)

## 2023-05-14 DIAGNOSIS — K56609 Unspecified intestinal obstruction, unspecified as to partial versus complete obstruction: Secondary | ICD-10-CM | POA: Diagnosis not present

## 2023-05-14 LAB — PROCALCITONIN: Procalcitonin: 0.3 ng/mL

## 2023-05-14 LAB — BASIC METABOLIC PANEL
Anion gap: 10 (ref 5–15)
BUN: 11 mg/dL (ref 8–23)
CO2: 21 mmol/L — ABNORMAL LOW (ref 22–32)
Calcium: 8.5 mg/dL — ABNORMAL LOW (ref 8.9–10.3)
Chloride: 107 mmol/L (ref 98–111)
Creatinine, Ser: 1.07 mg/dL (ref 0.61–1.24)
GFR, Estimated: >60 mL/min (ref >60)
Glucose, Bld: 112 mg/dL — ABNORMAL HIGH (ref 70–99)
Potassium: 3.9 mmol/L (ref 3.5–5.1)
Sodium: 138 mmol/L (ref 135–145)

## 2023-05-14 LAB — CBC WITH DIFFERENTIAL/PLATELET
Abs Immature Granulocytes: 0.04 10*3/uL (ref 0.00–0.07)
Basophils Absolute: 0 10*3/uL (ref 0.0–0.1)
Basophils Relative: 1 %
Eosinophils Absolute: 0.1 10*3/uL (ref 0.0–0.5)
Eosinophils Relative: 2 %
HCT: 29.7 % — ABNORMAL LOW (ref 39.0–52.0)
Hemoglobin: 8 g/dL — ABNORMAL LOW (ref 13.0–17.0)
Immature Granulocytes: 1 %
Lymphocytes Relative: 24 %
Lymphs Abs: 1.4 10*3/uL (ref 0.7–4.0)
MCH: 18.4 pg — ABNORMAL LOW (ref 26.0–34.0)
MCHC: 26.9 g/dL — ABNORMAL LOW (ref 30.0–36.0)
MCV: 68.3 fL — ABNORMAL LOW (ref 80.0–100.0)
Monocytes Absolute: 0.4 10*3/uL (ref 0.1–1.0)
Monocytes Relative: 7 %
Neutro Abs: 3.9 10*3/uL (ref 1.7–7.7)
Neutrophils Relative %: 65 %
Platelets: 427 10*3/uL — ABNORMAL HIGH (ref 150–400)
RBC: 4.35 MIL/uL (ref 4.22–5.81)
RDW: 20.7 % — ABNORMAL HIGH (ref 11.5–15.5)
WBC: 5.9 10*3/uL (ref 4.0–10.5)
nRBC: 0 % (ref 0.0–0.2)

## 2023-05-14 LAB — PHOSPHORUS: Phosphorus: 1.7 mg/dL — ABNORMAL LOW (ref 2.5–4.6)

## 2023-05-14 LAB — C-REACTIVE PROTEIN: CRP: 3 mg/dL — ABNORMAL HIGH (ref <1.0)

## 2023-05-14 LAB — MAGNESIUM: Magnesium: 2.1 mg/dL (ref 1.7–2.4)

## 2023-05-14 LAB — BRAIN NATRIURETIC PEPTIDE: B Natriuretic Peptide: 94.5 pg/mL (ref 0.0–100.0)

## 2023-05-14 MED ORDER — POTASSIUM PHOSPHATES 15 MMOLE/5ML IV SOLN
30.0000 mmol | Freq: Once | INTRAVENOUS | Status: AC
  Administered 2023-05-14: 30 mmol via INTRAVENOUS
  Filled 2023-05-14: qty 10

## 2023-05-14 MED ORDER — QUETIAPINE FUMARATE 100 MG PO TABS
200.0000 mg | ORAL_TABLET | Freq: Every day | ORAL | Status: DC
  Administered 2023-05-14: 200 mg via ORAL
  Filled 2023-05-14: qty 2

## 2023-05-14 NOTE — Progress Notes (Signed)
Occupational Therapy Treatment Patient Details Name: [name] MRN: [id number] DOB: 1951-02-24 Today's Date: 05/14/2023   History of present illness Pt is a 72 y/o male admitted 10/22 with 3 days of not feeling well and with L lower quadrant abdominal pain progressing to generalized and severe pain.  Found to have SBO, NGT placed.   PMH: Right hemiparesis secondary to thalamic hemorrhage 07/20/18, chronic back pain, headaches, left shoulder pain, OA, HTN, insomnia, GI bleed, glaucoma R eye, hepatitis C, hiatal hernia with GERD, gout, HLD, ischemic colitis, prostate cancer, syncope, depression, L rotator cuff syndrome, symptomatic anemia causing hypotension    Sugeries: appendectomy, inguinal hernia repair, prostatectomy, small intestine surgery   OT comments  Pt continuing to progress in OT sessions, throughout session pt remain CGA for STS and transfers, still recommend he have assist with ADLs from PCA due to balance deficits. OT to continue to progress pt as able, DC plans updated to [facility].       If plan is discharge home, recommend the following:  A little help with bathing/dressing/bathroom;Assistance with cooking/housework   Equipment Recommendations  None recommended by OT (pt has rec DME)    Recommendations for Other Services      Precautions / Restrictions Precautions Precautions: Fall Restrictions Weight Bearing Restrictions: No       Mobility Bed Mobility Overal bed mobility: Needs Assistance Bed Mobility: Supine to Sit     Supine to sit: Min assist, Used rails, HOB elevated     General bed mobility comments: light min A to power into sitting, left sitting in recliner    Transfers Overall transfer level: Needs assistance Equipment used: Rolling walker (2 wheels) Transfers: Sit to/from Stand Sit to Stand: Contact guard assist Stand pivot transfers: Contact guard assist         General transfer comment: STSx2 CGA from EOB     Balance Overall balance  assessment: Needs assistance Sitting-balance support: No upper extremity supported, Feet supported Sitting balance-Leahy Scale: Good     Standing balance support: Bilateral upper extremity supported, During functional activity Standing balance-Leahy Scale: Poor Standing balance comment: Reliant on RW                           ADL either performed or assessed with clinical judgement   ADL                   Upper Body Dressing : Sitting;Set up Upper Body Dressing Details (indicate cue type and reason): don gown                   General ADL Comments: focused session on progressing with STS and transfers in anticipation for DC home.    Extremity/Trunk Assessment              Vision       Perception     Praxis      Cognition Arousal: Alert Behavior During Therapy: WFL for tasks assessed/performed Overall Cognitive Status: Within Functional Limits for tasks assessed                                          Exercises General Exercises - Lower Extremity Hip Flexion/Marching: AROM, Standing, Both, 10 reps    Shoulder Instructions       General Comments VSS, HR 100-105 bpm with activity  Pertinent Vitals/ Pain       Pain Assessment Pain Assessment: Faces Faces Pain Scale: Hurts little more Pain Location: bottom Pain Descriptors / Indicators: Discomfort, Grimacing, Sore Pain Intervention(s): Limited activity within patient's tolerance, Monitored during session, Repositioned  Home Living                                          Prior Functioning/Environment              Frequency  Min 1X/week        Progress Toward Goals  OT Goals(current goals can now be found in the care plan section)  Progress towards OT goals: Progressing toward goals  Acute Rehab OT Goals Patient Stated Goal: To eventually get back to walking OT Goal Formulation: With patient Time For Goal Achievement:  05/26/23 Potential to Achieve Goals: Good  Plan      Co-evaluation                 AM-PAC OT "6 Clicks" Daily Activity     Outcome Measure   Help from another person eating meals?: None Help from another person taking care of personal grooming?: A Little Help from another person toileting, which includes using toliet, bedpan, or urinal?: A Little Help from another person bathing (including washing, rinsing, drying)?: A Little Help from another person to put on and taking off regular upper body clothing?: A Little Help from another person to put on and taking off regular lower body clothing?: A Lot 6 Click Score: 18    End of Session Equipment Utilized During Treatment: Gait belt;Rolling walker (2 wheels)  OT Visit Diagnosis: Unsteadiness on feet (R26.81);Other abnormalities of gait and mobility (R26.89);Muscle weakness (generalized) (M62.81)   Activity Tolerance Patient tolerated treatment well   Patient Left in chair;with call bell/phone within reach;with chair alarm set   Nurse Communication Mobility status        Time: 1610-1640 OT Time Calculation (min): 30 min  Charges: OT General Charges $OT Visit: 1 Visit OT Treatments $Therapeutic Activity: 23-37 mins  05/14/2023  AB, OTR/L  Acute Rehabilitation Services  Office: [phone]   Tristan Schroeder 05/14/2023, 4:50 PM

## 2023-05-14 NOTE — Plan of Care (Signed)
  Problem: Clinical Measurements: Goal: Ability to maintain clinical measurements within normal limits will improve Outcome: Progressing Goal: Will remain free from infection Outcome: Progressing Goal: Diagnostic test results will improve Outcome: Progressing   Problem: Activity: Goal: Risk for activity intolerance will decrease Outcome: Progressing   Problem: Coping: Goal: Level of anxiety will decrease Outcome: Progressing   

## 2023-05-14 NOTE — Plan of Care (Signed)

## 2023-05-14 NOTE — Progress Notes (Signed)
PROGRESS NOTE                                                                                                                                                                                                             Patient Demographics:    [name], is a 72 y.o. male, DOB - 12-16-1950, UJW:[id number]  Outpatient Primary MD for the patient is Loura Back, NP    LOS - 4  Admit date - 05/10/2023    Chief Complaint  Patient presents with   Fall   Hypotension       Brief Narrative (HPI from H&P)   72 y.o. male with medical history significant of hyperlipidemia, CVA, hepatitis C, iron deficiency anemia, small bowel obstruction s/p 2001 resection of bowel, GI bleed, hiatal hernia, and GERD who presents after having a fall at home, has been not feeling well for the last few days had some abdominal pain and nausea vomiting.  In the ER was diagnosed with small bowel obstruction, CT head and C-spine was nonacute and he was admitted to the hospital.  General surgery was consulted.   Subjective:   Patient in bed, appears comfortable, denies any headache, no fever, no chest pain or pressure, no shortness of breath , no abdominal pain. No focal weakness.  Wants to go home.   Assessment  & Plan :    Small bowel obstruction in a patient with History of small bowel obstruction and abdominal surgeries in the past. Records note prior history of small bowel obstruction back in 2001 which resulted in patient having multiple abdominal surgeries and resection of bowel.  Being treated conservatively, has NG tube, n.p.o. with IV fluids, much improved on 05/12/2023, bowel movements, no nausea or abdominal pain, tolerated soft diet advance to regular, doing much better and symptom-free having bowel movements, discussed with patient's brother family wants him to be discharged on 05/15/2023 as they have no help today at home and patient lives by  himself.  Stop all antibiotics, general surgery has signed off.   Dehydration with lactic acidosis, hypotension and AKI.  Likely all due to dehydration from #1 above, no sepsis, continue to follow cultures, much improved with IV fluids and hydration.    Acute kidney injury - due to above, hydrate and monitor.  Improved.   Iron deficiency anemia acute on chronic.  Some acute fall due to heme dilution from IV fluids.  Getting 1 unit of packed RBC transfusion on 05/11/2023, no signs of active bleeding, iron deficiency and low normal B12 noted on anemia panel, replace as appropriate.  Fall at home, Right hip pain - Patient presented after having a fall at home.  Noted complaints of right hip pain prior to the fall.  No traumatic injury noted on CT imaging. Follow-up CK,   PT/OT     Hypokalemia & hypophosphatemia replaced   Thrombocytosis  Chronic.  Platelet count elevated at 479, but appears to be around patient baseline. Continue to monitor   Tobacco abuse  Patient reports smoking half a pack of cigarettes per day on average. Nicotine patch     GERD, Hiatal hernia  -Protonix IV       Condition - Guarded  Family Communication  : Devonne Doughty [phone]  on 05/11/2023, 05/12/23, 05/14/2023  Code Status :  Full  Consults  :  CCS  PUD Prophylaxis : PPI   Procedures  :     CT head.  1. No acute intracranial abnormality or acute traumatic injury identified. 2. Small chronic bilateral subdural fluid collections are suspected and stable since May (4-5 mm on the left, 2-3 mm on the right). 3. Advanced chronic small vessel disease appears stable by CT  CT C-spine.  1. No acute traumatic injury identified in the cervical spine. 2. Bulky cervical spine degeneration superimposed on chronic C5-C6 ankylosis. Chronic mild to moderate cervical spinal stenosis is suspected C3-C4 through C5-C6.  CT abdomen pelvis - 1. Positive for High-grade Small Bowel Obstruction. Discrete transition point is  difficult to identify but seems to be beneath the left ventral abdominal wall. Chronic postoperative changes to the abdomen, favor adhesions. 2. Fluid distended stomach, gastric hiatal hernia, distal esophagus. And a chronic small bowel anastomosis in the right abdomen is also dilated. Large bowel diffusely decompressed. Trace free fluid in the pelvis. No free air or free fluid identified in the abdomen. 3. No superimposed acute traumatic injury identified in the noncontrast abdomen or pelvis. Aortic Atherosclerosis (ICD10-I70.0)      Disposition Plan  :    Status is: Inpatient   DVT Prophylaxis  :  Heparin  heparin injection 5,000 Units Start: 05/11/23 0900 SCDs Start: 05/10/23 0739    Lab Results  Component Value Date   PLT 427 (H) 05/14/2023    Diet :  Diet Order             Diet Heart Room service appropriate? Yes; Fluid consistency: Thin  Diet effective now                    Inpatient Medications  Scheduled Meds:  carvedilol  3.125 mg Oral BID WC   vitamin B-12  1,000 mcg Oral Daily   diclofenac Sodium  2 g Topical BID   ferrous sulfate  325 mg Oral BID WC   folic acid  1 mg Oral Daily   heparin injection (subcutaneous)  5,000 Units Subcutaneous Q8H   nicotine  14 mg Transdermal Daily   pantoprazole (PROTONIX) IV  40 mg Intravenous Q24H   QUEtiapine  100 mg Oral QHS   sodium chloride flush  3 mL Intravenous Q12H   Continuous Infusions:  potassium PHOSPHATE IVPB (in mmol) 30 mmol (05/14/23 0659)   PRN Meds:.acetaminophen **OR** acetaminophen, albuterol, hydrALAZINE, ondansetron (ZOFRAN) IV  Antibiotics  :    Anti-infectives (From admission, onward)    Start  Dose/Rate Route Frequency Ordered Stop   05/11/23 0600  cefTRIAXone (ROCEPHIN) 2 g in sodium chloride 0.9 % 100 mL IVPB  Status:  Discontinued        2 g 200 mL/hr over 30 Minutes Intravenous Every 24 hours 05/10/23 0745 05/14/23 0808   05/10/23 2000  metroNIDAZOLE (FLAGYL) IVPB 500 mg  Status:   Discontinued        500 mg 100 mL/hr over 60 Minutes Intravenous Every 12 hours 05/10/23 0745 05/14/23 0808   05/10/23 0515  cefTRIAXone (ROCEPHIN) 2 g in sodium chloride 0.9 % 100 mL IVPB        2 g 200 mL/hr over 30 Minutes Intravenous  Once 05/10/23 0510 05/10/23 0615   05/10/23 0515  metroNIDAZOLE (FLAGYL) IVPB 500 mg        500 mg 100 mL/hr over 60 Minutes Intravenous  Once 05/10/23 0510 05/10/23 0646         Objective:   Vitals:   05/13/23 1952 05/13/23 2300 05/14/23 0000 05/14/23 0346  BP: (!) 176/87 (!) 149/73 (!) 144/63 (!) 160/59  Pulse: 72 70 67 69  Resp: 15 (!) 27 17 (!) 28  Temp: 98.7 F (37.1 C) 98.6 F (37 C)  98 F (36.7 C)  TempSrc:  Oral  Oral  SpO2: 98% 96% 96% 99%  Weight:      Height:        Wt Readings from Last 3 Encounters:  05/10/23 93.4 kg  11/17/22 93.4 kg  02/12/22 90.4 kg     Intake/Output Summary (Last 24 hours) at 05/14/2023 0810 Last data filed at 05/14/2023 0543 Gross per 24 hour  Intake 921.26 ml  Output 2001 ml  Net -1079.74 ml     Physical Exam  Awake Alert, No new F.N deficits, Normal affect Leslie.AT,PERRAL Supple Neck, No JVD,   Symmetrical Chest wall movement, Good air movement bilaterally, CTAB RRR,No Gallops,Rubs or new Murmurs,  +ve B.Sounds, Abd Soft, No tenderness,    No Cyanosis, Clubbing or edema       Data Review:    Recent Labs  Lab 05/10/23 0504 05/10/23 0853 05/11/23 0326 05/11/23 0853 05/12/23 0549 05/13/23 0305 05/14/23 0524  WBC 14.9*  --  7.6 7.2 5.9 6.7 5.9  HGB 7.3*   < > 6.9* 8.0* 8.1* 7.8* 8.0*  HCT 29.1*   < > 25.5* 28.6* 30.5* 29.2* 29.7*  PLT 479*  --  371 360 375 394 427*  MCV 68.1*  --  64.9* 66.4* 68.1* 68.1* 68.3*  MCH 17.1*  --  17.6* 18.6* 18.1* 18.2* 18.4*  MCHC 25.1*  --  27.1* 28.0* 26.6* 26.7* 26.9*  RDW 19.9*  --  19.7* 20.8* 20.6* 20.7* 20.7*  LYMPHSABS 0.2*  --   --   --  0.8 1.0 1.4  MONOABS 0.9  --   --   --  0.8 0.6 0.4  EOSABS 0.0  --   --   --  0.1 0.1 0.1   BASOSABS 0.0  --   --   --  0.0 0.0 0.0   < > = values in this interval not displayed.    Recent Labs  Lab 05/10/23 0501 05/10/23 0504 05/10/23 0701 05/10/23 1103 05/11/23 0326 05/11/23 0853 05/12/23 0549 05/13/23 0305 05/14/23 0524  NA 139 140  --   --  141  --  140 137 138  K 3.3* 3.4*  --   --  3.3*  --  3.6 3.3* 3.9  CL 99 100  --   --  95*  --  102 104 107  CO2  --  18*  --   --  33*  --  30 24 21*  ANIONGAP  --  22*  --   --  13  --  8 9 10   GLUCOSE 126* 127*  --   --  106*  --  86 89 112*  BUN 24* 25*  --   --  43*  --  26* 11 11  CREATININE 3.60* 3.46*  --   --  2.91*  --  1.34* 0.98 1.07  AST  --  22  --   --  52*  --   --   --   --   ALT  --  11  --   --  55*  --   --   --   --   ALKPHOS  --  74  --   --  64  --   --   --   --   BILITOT  --  0.6  --   --  0.6  --   --   --   --   ALBUMIN  --  3.4*  --   --  3.2*  --   --   --   --   CRP  --   --   --   --   --   --  13.9* 6.4* 3.0*  DDIMER  --  <0.27  --   --   --   --   --   --   --   PROCALCITON  --   --   --   --   --   --  2.11 1.00 0.30  LATICACIDVEN 11.9*  --  7.0* 3.3*  --  1.4  --   --   --   BNP  --   --   --   --   --   --  189.0* 230.9* 94.5  MG  --   --   --   --  2.0  --  2.4 2.1 2.1  CALCIUM  --  9.7  --   --  8.3*  --  8.7* 8.6* 8.5*      Recent Labs  Lab 05/10/23 0501 05/10/23 0504 05/10/23 0701 05/10/23 1103 05/11/23 0326 05/11/23 0853 05/12/23 0549 05/13/23 0305 05/14/23 0524  CRP  --   --   --   --   --   --  13.9* 6.4* 3.0*  DDIMER  --  <0.27  --   --   --   --   --   --   --   PROCALCITON  --   --   --   --   --   --  2.11 1.00 0.30  LATICACIDVEN 11.9*  --  7.0* 3.3*  --  1.4  --   --   --   BNP  --   --   --   --   --   --  189.0* 230.9* 94.5  MG  --   --   --   --  2.0  --  2.4 2.1 2.1  CALCIUM  --  9.7  --   --  8.3*  --  8.7* 8.6* 8.5*    --------------------------------------------------------------------------------------------------------------- Lab Results  Component  Value Date   CHOL 108 06/17/2021   HDL 39.80 06/17/2021   LDLCALC 47 06/17/2021   TRIG 104.0 06/17/2021   CHOLHDL 3 06/17/2021  Lab Results  Component Value Date   HGBA1C 4.7 (L) 07/22/2018   No results for input(s): "TSH", "T4TOTAL", "FREET4", "T3FREE", "THYROIDAB" in the last 72 hours. No results for input(s): "VITAMINB12", "FOLATE", "FERRITIN", "TIBC", "IRON", "RETICCTPCT" in the last 72 hours.  ------------------------------------------------------------------------------------------------------------------ Cardiac Enzymes No results for input(s): "CKMB", "TROPONINI", "MYOGLOBIN" in the last 168 hours.  Invalid input(s): "CK"  Micro Results Recent Results (from the past 240 hour(s))  SARS Coronavirus 2 by RT PCR (hospital order, performed in [facility] hospital lab) *cepheid single result test* Nasopharyngeal Swab     Status: None   Collection Time: 05/10/23  8:53 AM   Specimen: Nasopharyngeal Swab; Nasal Swab  Result Value Ref Range Status   SARS Coronavirus 2 by RT PCR NEGATIVE NEGATIVE Final    Comment: Performed at [facility], [address] [place], [place] [address]  Urine Culture     Status: None   Collection Time: 05/10/23  4:48 PM   Specimen: Urine, Random  Result Value Ref Range Status   Specimen Description URINE, RANDOM  Final   Special Requests NONE Reflexed from [id number]  Final   Culture   Final    NO GROWTH Performed at [facility], [address] [place], [place] [address]    Report Status 05/11/2023 FINAL  Final    Radiology Reports DG Abd 1 View  Result Date: 05/12/2023 CLINICAL DATA:  72 year old male small-bowel obstruction. EXAM: ABDOMEN - 1 VIEW COMPARISON:  CT Abdomen and Pelvis 05/10/2023. Abdominal radiographs yesterday. FINDINGS: Portable AP supine view at 0639 hours. Administered oral contrast is present in the large bowel including the rectum, and some has cleared since yesterday. Stable enteric tube looped in the  epigastrium. Bowel gas pattern mildly improved since 05/10/2023. Stable lung bases. Stable visualized osseous structures. IMPRESSION: Bowel gas pattern improved since 05/10/2023. Retained oral contrast in the large bowel. Enteric tube remains in the epigastrium. Electronically Signed   By: Odessa Fleming M.D.   On: 05/12/2023 07:45   DG Abd Portable 1V-Small Bowel Obstruction Protocol-initial, 8 hr delay  Result Date: 05/11/2023 CLINICAL DATA:  Small bowel obstruction. EXAM: PORTABLE ABDOMEN - 1 VIEW COMPARISON:  Radiograph earlier today FINDINGS: Administered enteric contrast is seen throughout the colon to the level of the rectum. There is improving gaseous small bowel distension centrally. The tip and side port of the enteric tube appear to be below the diaphragm in the stomach. IMPRESSION: Administered enteric contrast throughout the colon to the level of the rectum. Improving gaseous small bowel distension. Findings likely represent resolving bowel obstruction. Electronically Signed   By: Narda Rutherford M.D.   On: 05/11/2023 23:39   DG Abd 1 View  Result Date: 05/11/2023 CLINICAL DATA:  Nasogastric tube placement EXAM: ABDOMEN - 1 VIEW COMPARISON:  Radiograph earlier today FINDINGS: The enteric tube is looped within a large hiatal hernia in the lower thorax. The tip is directed cranially projecting over the lower thorax. Dilated small bowel in the upper abdomen partially included. IMPRESSION: The enteric tube is looped within a large hiatal hernia in the lower thorax. The tip is directed cranially projecting over the lower thorax. Recommend repositioning. Electronically Signed   By: Narda Rutherford M.D.   On: 05/11/2023 14:37   DG Abd Portable 1V  Result Date: 05/11/2023 CLINICAL DATA:  101717 Nausea 101717.  Left lower quadrant pain. EXAM: PORTABLE ABDOMEN - 1 VIEW COMPARISON:  Abdominal radiograph 05/10/2023. CT abdomen/pelvis 05/10/2023. FINDINGS: Persistent dilation of small-bowel loops in  the  left hemiabdomen, compatible with small-bowel obstruction. IMPRESSION: Persistent small-bowel obstruction. Electronically Signed   By: Orvan Falconer M.D.   On: 05/11/2023 09:19      Signature  -   Susa Raring M.D on 05/14/2023 at 8:10 AM   -  To page go to [email or web address]

## 2023-05-15 DIAGNOSIS — K56609 Unspecified intestinal obstruction, unspecified as to partial versus complete obstruction: Secondary | ICD-10-CM | POA: Diagnosis not present

## 2023-05-15 LAB — CBC WITH DIFFERENTIAL/PLATELET
Abs Immature Granulocytes: 0.03 10*3/uL (ref 0.00–0.07)
Basophils Absolute: 0.1 10*3/uL (ref 0.0–0.1)
Basophils Relative: 1 %
Eosinophils Absolute: 0.1 10*3/uL (ref 0.0–0.5)
Eosinophils Relative: 2 %
HCT: 31.3 % — ABNORMAL LOW (ref 39.0–52.0)
Hemoglobin: 8.5 g/dL — ABNORMAL LOW (ref 13.0–17.0)
Immature Granulocytes: 1 %
Lymphocytes Relative: 26 %
Lymphs Abs: 1.5 10*3/uL (ref 0.7–4.0)
MCH: 18.9 pg — ABNORMAL LOW (ref 26.0–34.0)
MCHC: 27.2 g/dL — ABNORMAL LOW (ref 30.0–36.0)
MCV: 69.7 fL — ABNORMAL LOW (ref 80.0–100.0)
Monocytes Absolute: 0.5 10*3/uL (ref 0.1–1.0)
Monocytes Relative: 9 %
Neutro Abs: 3.5 10*3/uL (ref 1.7–7.7)
Neutrophils Relative %: 61 %
Platelets: 426 10*3/uL — ABNORMAL HIGH (ref 150–400)
RBC: 4.49 MIL/uL (ref 4.22–5.81)
RDW: 21.4 % — ABNORMAL HIGH (ref 11.5–15.5)
WBC: 5.7 10*3/uL (ref 4.0–10.5)
nRBC: 0 % (ref 0.0–0.2)

## 2023-05-15 LAB — BASIC METABOLIC PANEL
Anion gap: 8 (ref 5–15)
BUN: 11 mg/dL (ref 8–23)
CO2: 20 mmol/L — ABNORMAL LOW (ref 22–32)
Calcium: 8.4 mg/dL — ABNORMAL LOW (ref 8.9–10.3)
Chloride: 110 mmol/L (ref 98–111)
Creatinine, Ser: 1.01 mg/dL (ref 0.61–1.24)
GFR, Estimated: >60 mL/min (ref >60)
Glucose, Bld: 92 mg/dL (ref 70–99)
Potassium: 4.1 mmol/L (ref 3.5–5.1)
Sodium: 138 mmol/L (ref 135–145)

## 2023-05-15 LAB — MAGNESIUM: Magnesium: 2 mg/dL (ref 1.7–2.4)

## 2023-05-15 LAB — C-REACTIVE PROTEIN: CRP: 1.7 mg/dL — ABNORMAL HIGH (ref <1.0)

## 2023-05-15 LAB — PHOSPHORUS: Phosphorus: 2.2 mg/dL — ABNORMAL LOW (ref 2.5–4.6)

## 2023-05-15 LAB — BRAIN NATRIURETIC PEPTIDE: B Natriuretic Peptide: 67.3 pg/mL (ref 0.0–100.0)

## 2023-05-15 LAB — PROCALCITONIN: Procalcitonin: 0.18 ng/mL

## 2023-05-15 MED ORDER — FOLIC ACID 1 MG PO TABS: 1.0000 mg | ORAL_TABLET | Freq: Every day | ORAL | 0 refills | Status: AC

## 2023-05-15 MED ORDER — CARVEDILOL 3.125 MG PO TABS: 3.1250 mg | ORAL_TABLET | Freq: Two times a day (BID) | ORAL | 0 refills | Status: AC

## 2023-05-15 MED ORDER — K PHOS MONO-SOD PHOS DI & MONO 155-852-130 MG PO TABS
500.0000 mg | ORAL_TABLET | Freq: Three times a day (TID) | ORAL | Status: DC
  Administered 2023-05-15 (×2): 500 mg via ORAL
  Filled 2023-05-15 (×2): qty 2

## 2023-05-15 MED ORDER — K PHOS MONO-SOD PHOS DI & MONO 155-852-130 MG PO TABS: 500.0000 mg | ORAL_TABLET | Freq: Two times a day (BID) | ORAL | 0 refills | Status: AC

## 2023-05-15 MED ORDER — CYANOCOBALAMIN 1000 MCG PO TABS: 1000.0000 ug | ORAL_TABLET | Freq: Every day | ORAL | 0 refills | Status: AC

## 2023-05-15 MED ORDER — HYDRALAZINE HCL 50 MG PO TABS
50.0000 mg | ORAL_TABLET | Freq: Three times a day (TID) | ORAL | Status: DC
Start: 2023-05-15 — End: 2023-05-15
  Administered 2023-05-15: 50 mg via ORAL
  Filled 2023-05-15: qty 1

## 2023-05-15 MED ORDER — SODIUM PHOSPHATES 45 MMOLE/15ML IV SOLN
30.0000 mmol | Freq: Once | INTRAVENOUS | Status: DC
  Filled 2023-05-15: qty 10

## 2023-05-15 MED ORDER — FERROUS SULFATE 325 (65 FE) MG PO TABS: 325.0000 mg | ORAL_TABLET | Freq: Every day | ORAL | 0 refills | Status: AC

## 2023-05-15 MED ORDER — CYANOCOBALAMIN 1000 MCG PO TABS: 1000.0000 ug | ORAL_TABLET | Freq: Every day | ORAL | 0 refills | Status: DC

## 2023-05-15 MED ORDER — NICOTINE 21 MG/24HR TD PT24: 21.0000 mg | MEDICATED_PATCH | Freq: Every day | TRANSDERMAL | 0 refills | Status: AC

## 2023-05-15 NOTE — Plan of Care (Signed)

## 2023-05-15 NOTE — Care Management (Addendum)
Will need to verify that the patient's apartment is unlocked and he is able to access it.  Could not LVM for [facility], offices are closed, [phone]. LVM with patient's brother [name] requesting call back, [phone].  LVM with Emergency Management for apartment complex to unlock door [phone]. Nurse will check with patient to verify that he can get into apartment, she states that he has a necklace with a key.   PTAR forms have been printed and sent to unit, Nurse provided with number to call PTAR once infusion for today are completed and access is confirmed.    11:45 Went to patient's room to speak to him. He was on the phone w property manage [name]. I spoke w [name] who states that even though the front door is bolted (for security since EMS has to break the door down to get in) there is a second side door he can access. [name] states that he will go to the property around 12-1 to cut the bolt on the front door. We agreed that if I set up PTAR for 3pm, that would ensure him plenty of time to get access to the front door for the patient.  Nurse states that the brother [name] called her, and he is aware that the patient will DC today. Nurse states that meds switched to PO and have been given.   PTAR called for 3pm pick up   11:55 [name] called me and I updated him on the plan for DC, he asked that the nurse call him when PTAR arrives, I have passed this request on to the nurse   16:15 PTAR refused transport. Wheelchair transport unavailable over the weekend on demand, will need scheduled tomorrow if patient does not find alternative transport on his own. Attending aware of delay caused by [facility]

## 2023-05-15 NOTE — Discharge Instructions (Addendum)
Follow with Primary MD Loura Back, NP in 7 days   Get CBC, CMP, phosphorous, magnesium -  checked next visit with your primary MD    Activity: As tolerated with Full fall precautions use walker/cane & assistance as needed  Disposition Home    Diet: Heart Healthy  - do not consume stale or expired food in the refrigerator  Special Instructions: If you have smoked or chewed Tobacco  in the last 2 yrs please stop smoking, stop any regular Alcohol  and or any Recreational drug use.  On your next visit with your primary care physician please Get Medicines reviewed and adjusted.  Please request your Prim.MD to go over all Hospital Tests and Procedure/Radiological results at the follow up, please get all Hospital records sent to your Prim MD by signing hospital release before you go home.  If you experience worsening of your admission symptoms, develop shortness of breath, life threatening emergency, suicidal or homicidal thoughts you must seek medical attention immediately by calling 911 or calling your MD immediately  if symptoms less severe.  You Must read complete instructions/literature along with all the possible adverse reactions/side effects for all the Medicines you take and that have been prescribed to you. Take any new Medicines after you have completely understood and accpet all the possible adverse reactions/side effects.   Do not drive when taking Pain medications.  Do not take more than prescribed Pain, Sleep and Anxiety Medications

## 2023-05-15 NOTE — Progress Notes (Signed)
Patient discharged at this time via neighbor that came to pick him up.  No s/s of distress noted.  Denies pain.  Educated on AVS including medication pickup and administration, follow up visits and about home health PT &OT coming out to house.  All belongings packed and sent home with patient at this time.  Stand pivot utilized to transfer from bed to wheelchair to car.

## 2023-05-15 NOTE — Discharge Summary (Signed)
[name] ZOX:[id number] DOB: [birth year] DOA: 05/10/2023  PCP: Loura Back, NP  Admit date: 05/10/2023  Discharge date: 05/15/2023  Admitted From: Home   Disposition:  Home   Recommendations for Outpatient Follow-up:   Follow up with PCP in 1-2 weeks  PCP Please obtain BMP/CBC, 2 view CXR in 1week,  (see Discharge instructions)   PCP Please follow up on the following pending results:    Home Health: None   Equipment/Devices: None  Consultations: CCS Discharge Condition: Stable    CODE STATUS: Full    Diet Recommendation: Heart Healthy     Chief Complaint  Patient presents with   Fall   Hypotension     Brief history of present illness from the day of admission and additional interim summary    72 y.o. male with medical history significant of hyperlipidemia, CVA, hepatitis C, iron deficiency anemia, small bowel obstruction s/p 2001 resection of bowel, GI bleed, hiatal hernia, and GERD who presents after having a fall at home, has been not feeling well for the last few days had some abdominal pain and nausea vomiting.  In the ER was diagnosed with small bowel obstruction, CT head and C-spine was nonacute and he was admitted to the hospital.  General surgery was consulted.                                                                  Hospital Course   Small bowel obstruction in a patient with History of small bowel obstruction and abdominal surgeries in the past. Records note prior history of small bowel obstruction back in 2001 which resulted in patient having multiple abdominal surgeries and resection of bowel.  Being treated conservatively, has NG tube, n.p.o. with IV fluids, much improved on 05/12/2023, bowel movements, no nausea or abdominal pain, tolerated soft diet advance to regular, doing  much better and symptom-free having bowel movements, discussed with patient's brother family wants him to be discharged on 05/15/2023 as they have no help today at home and patient lives by himself.  He remains symptom-free on 05/15/2023 and wants to be discharged home.   Dehydration with lactic acidosis, hypotension and AKI.  Likely all due to dehydration from #1 above, no sepsis, continue to follow cultures, much improved with IV fluids and hydration.  PCP to monitor postdischarge.    Acute kidney injury - due to above, hydrate and monitor.  Improved.   Iron deficiency anemia acute on chronic.  Some acute fall due to heme dilution from IV fluids.  Getting 1 unit of packed RBC transfusion on 05/11/2023, no signs of active bleeding, iron deficiency and low normal B12 noted on anemia panel, placed on iron and B12 replacement, CBC currently stable, PCP to monitor CBC along with iron and B12 levels closely.  Outpatient  GI follow-up in 1 to 2 months recommended to be arranged by PCP.   Fall at home, Right hip pain - Patient presented after having a fall at home.  Noted complaints of right hip pain prior to the fall.  No traumatic injury noted on CT imaging. Follow-up CK, he is wheelchair-bound at home, continue supportive care at home.   Hypokalemia & hypophosphatemia replaced, PCP to monitor.   Thrombocytosis  Chronic.  Platelet count elevated at 479, but appears to be around patient baseline. Continue to monitor   Tobacco abuse  Patient reports smoking half a pack of cigarettes per day on average. Nicotine patch     GERD, Hiatal hernia  -Protonix    Hypretension.  Added Coreg to hydralazine for better control.    Discharge diagnosis     Principal Problem:   SBO (small bowel obstruction) (HCC) Active Problems:   SIRS (systemic inflammatory response syndrome) (HCC)   Lactic acidosis   Transient hypotension   AKI (acute kidney injury) (HCC)   Anemia, iron deficiency   Fall at home,  initial encounter   Right hip pain   Hypokalemia   Thrombocytosis   Tobacco abuse    Discharge instructions    Discharge Instructions     Ambulatory Referral for Lung Cancer Scre   Complete by: As directed    Discharge instructions   Complete by: As directed    Get CBC, CMP, phosphorous, magnesium -  checked next visit with your primary MD       Discharge Medications   Allergies as of 05/15/2023       Reactions   Sudafed [pseudoephedrine] Palpitations, Other (See Comments)   Heart "races"        Medication List     TAKE these medications    acetaminophen 325 MG tablet Commonly known as: TYLENOL Take 2 tablets (650 mg total) by mouth every 6 (six) hours as needed for moderate pain or headache.   buPROPion 200 MG 12 hr tablet Commonly known as: WELLBUTRIN SR TAKE 1 TABLET BY MOUTH DAILY   carvedilol 3.125 MG tablet Commonly known as: COREG Take 1 tablet (3.125 mg total) by mouth 2 (two) times daily with a meal.   cyanocobalamin 1000 MCG tablet Take 1 tablet (1,000 mcg total) by mouth daily. Start taking on: May 16, 2023   ferrous sulfate 325 (65 FE) MG tablet Take 1 tablet (325 mg total) by mouth daily with breakfast.   folic acid 1 MG tablet Commonly known as: FOLVITE Take 1 tablet (1 mg total) by mouth daily. Start taking on: May 16, 2023   furosemide 20 MG tablet Commonly known as: LASIX Take 1 tablet (20 mg total) by mouth 2 (two) times daily.   hydrALAZINE 50 MG tablet Commonly known as: APRESOLINE Take 50 mg by mouth 3 (three) times daily.   ketoconazole 2 % cream Commonly known as: NIZORAL Apply topically.   nicotine 21 mg/24hr patch Commonly known as: NICODERM CQ - dosed in mg/24 hours Place 1 patch (21 mg total) onto the skin daily. Start taking on: May 16, 2023   ondansetron 4 MG tablet Commonly known as: ZOFRAN Take 4 mg by mouth every 8 (eight) hours as needed.   pantoprazole 40 MG tablet Commonly known as:  PROTONIX TAKE 1 TABLET BY MOUTH TWICE A DAY BEFORE MEALS   phosphorus 155-852-130 MG tablet Commonly known as: K PHOS NEUTRAL Take 2 tablets (500 mg total) by mouth 2 (two) times daily for 2 doses.  QUEtiapine 200 MG tablet Commonly known as: SEROQUEL Take 1 tablet (200 mg total) by mouth at bedtime.   rosuvastatin 5 MG tablet Commonly known as: CRESTOR SMARTSIG:1 Tablet(s) By Mouth Every Evening   sertraline 100 MG tablet Commonly known as: ZOLOFT Take 1 tablet (100 mg total) by mouth daily.   spironolactone 25 MG tablet Commonly known as: ALDACTONE Take 1 tablet (25 mg total) by mouth daily.   Vitamin D (Ergocalciferol) 50000 units Caps Take 1 capsule by mouth once a week.          Follow-up Information     [place] Follow up.   Why: [facility] will contact you within 48 hours of DC to arrange a home visit for physical and occupational therapy Contact information: [address] [place] [address] [phone]         Loura Back, NP. Schedule an appointment as soon as possible for a visit in 1 week(s).   Specialty: Nurse Practitioner Contact information: [address] [place] [address] [phone]                 Major procedures and Radiology Reports - PLEASE review detailed and final reports thoroughly  -       DG Abd 1 View  Result Date: 05/12/2023 CLINICAL DATA:  72 year old male small-bowel obstruction. EXAM: ABDOMEN - 1 VIEW COMPARISON:  CT Abdomen and Pelvis 05/10/2023. Abdominal radiographs yesterday. FINDINGS: Portable AP supine view at 0639 hours. Administered oral contrast is present in the large bowel including the rectum, and some has cleared since yesterday. Stable enteric tube looped in the epigastrium. Bowel gas pattern mildly improved since 05/10/2023. Stable lung bases. Stable visualized osseous structures. IMPRESSION: Bowel gas pattern improved since 05/10/2023. Retained oral contrast  in the large bowel. Enteric tube remains in the epigastrium. Electronically Signed   By: Odessa Fleming M.D.   On: 05/12/2023 07:45   DG Abd Portable 1V-Small Bowel Obstruction Protocol-initial, 8 hr delay  Result Date: 05/11/2023 CLINICAL DATA:  Small bowel obstruction. EXAM: PORTABLE ABDOMEN - 1 VIEW COMPARISON:  Radiograph earlier today FINDINGS: Administered enteric contrast is seen throughout the colon to the level of the rectum. There is improving gaseous small bowel distension centrally. The tip and side port of the enteric tube appear to be below the diaphragm in the stomach. IMPRESSION: Administered enteric contrast throughout the colon to the level of the rectum. Improving gaseous small bowel distension. Findings likely represent resolving bowel obstruction. Electronically Signed   By: Narda Rutherford M.D.   On: 05/11/2023 23:39   DG Abd 1 View  Result Date: 05/11/2023 CLINICAL DATA:  Nasogastric tube placement EXAM: ABDOMEN - 1 VIEW COMPARISON:  Radiograph earlier today FINDINGS: The enteric tube is looped within a large hiatal hernia in the lower thorax. The tip is directed cranially projecting over the lower thorax. Dilated small bowel in the upper abdomen partially included. IMPRESSION: The enteric tube is looped within a large hiatal hernia in the lower thorax. The tip is directed cranially projecting over the lower thorax. Recommend repositioning. Electronically Signed   By: Narda Rutherford M.D.   On: 05/11/2023 14:37   DG Abd Portable 1V  Result Date: 05/11/2023 CLINICAL DATA:  101717 Nausea 101717.  Left lower quadrant pain. EXAM: PORTABLE ABDOMEN - 1 VIEW COMPARISON:  Abdominal radiograph 05/10/2023. CT abdomen/pelvis 05/10/2023. FINDINGS: Persistent dilation of small-bowel loops in the left hemiabdomen, compatible with small-bowel obstruction. IMPRESSION: Persistent small-bowel obstruction. Electronically Signed  By: Orvan Falconer M.D.   On: 05/11/2023 09:19   DG Abd Portable 1  View  Result Date: 05/10/2023 CLINICAL DATA:  NG tube placement EXAM: PORTABLE ABDOMEN - 1 VIEW limited for tube placement COMPARISON:  CT 05/10/2023 earlier FINDINGS: NG tube with the tip coiling above the level of the diaphragm but patient has a known hiatal hernia. Along the visualized upper abdomen multiple dilated loops of small bowel are seen as well as some air in the colon on this limited portable semi upright view of the upper abdomen and lower chest. IMPRESSION: Enteric tube presumably in the known hiatal hernia above the diaphragm Electronically Signed   By: Karen Kays M.D.   On: 05/10/2023 10:11   CT ABDOMEN PELVIS WO CONTRAST  Result Date: 05/10/2023 CLINICAL DATA:  72 year old male with hypertension, fall. Pre-existing abdominal pain for several days. EXAM: CT ABDOMEN AND PELVIS WITHOUT CONTRAST TECHNIQUE: Multidetector CT imaging of the abdomen and pelvis was performed following the standard protocol without IV contrast. RADIATION DOSE REDUCTION: This exam was performed according to the departmental dose-optimization program which includes automated exposure control, adjustment of the mA and/or kV according to patient size and/or use of iterative reconstruction technique. COMPARISON:  Abdominal radiographs 05/09/2023. CT Abdomen and Pelvis 02/14/2017. FINDINGS: Lower chest: Calcified coronary artery atherosclerosis. Normal heart size. No pericardial or pleural effusion. But fluid distension of the distal thoracic esophagus and moderate size chronic hiatal hernia. Mild lower lobe atelectasis. Hepatobiliary: Negative noncontrast liver and gallbladder. Pancreas: Negative. Spleen: Negative. Adrenals/Urinary Tract: Stable since 2018, negative. Right renal vascular calcification. No convincing nephrolithiasis. Stomach/Bowel: Redundant but decompressed large bowel throughout the abdomen and pelvis. Decompressed terminal ileum and ileocecal valve on coronal image 91. Decompressed distal small bowel  loops throughout the right lower quadrant. Fluid distended stomach including hiatal hernia. Fluid distended duodenum and proximal jejunum. Air-fluid containing jejunal loops are dilated up to 5 cm diameter, mostly in the left abdomen. There is a dilated chronic small bowel anastomosis in the right upper quadrant which is somewhat separate. Discrete transition point is difficult to identify but seems to be in the left anterior abdomen along the ventral abdominal wall. Chronic postoperative changes to the abdominal wall. No pneumoperitoneum identified. No free fluid in the abdomen. Vascular/Lymphatic: Extensive Aortoiliac calcified atherosclerosis. Vascular patency is not evaluated in the absence of IV contrast. Normal caliber abdominal aorta. No lymphadenopathy identified. Reproductive: Stable. Chronic heterogeneity of the right inguinal canal which is nonspecific. Other: Trace free fluid in the pelvis, simple fluid density series 4, image 85. Musculoskeletal: Widespread flowing vertebral endplate osteophytes. Subsequent multilevel lower thoracic interbody ankylosis. Progressed and severe lower lumbar disc and endplate degeneration since 2018. Progressed SI joint ankylosis since 2018. Chronic hip joint degeneration. Chronic left posterior 11th rib fracture. No acute osseous abnormality identified. IMPRESSION: 1. Positive for High-grade Small Bowel Obstruction. Discrete transition point is difficult to identify but seems to be beneath the left ventral abdominal wall. Chronic postoperative changes to the abdomen, favor adhesions. 2. Fluid distended stomach, gastric hiatal hernia, distal esophagus. And a chronic small bowel anastomosis in the right abdomen is also dilated. Large bowel diffusely decompressed. Trace free fluid in the pelvis. No free air or free fluid identified in the abdomen. 3. No superimposed acute traumatic injury identified in the noncontrast abdomen or pelvis. Aortic Atherosclerosis (ICD10-I70.0).  Electronically Signed   By: Odessa Fleming M.D.   On: 05/10/2023 06:30   CT Cervical Spine Wo Contrast  Result Date: 05/10/2023 CLINICAL DATA:  72 year old male with hypertension, fall. Pain. EXAM: CT CERVICAL SPINE WITHOUT CONTRAST TECHNIQUE: Multidetector CT imaging of the cervical spine was performed without intravenous contrast. Multiplanar CT image reconstructions were also generated. RADIATION DOSE REDUCTION: This exam was performed according to the departmental dose-optimization program which includes automated exposure control, adjustment of the mA and/or kV according to patient size and/or use of iterative reconstruction technique. COMPARISON:  Head CT today.  Cervical spine CT 02/12/2020. FINDINGS: Alignment: Chronic straightening of cervical lordosis. Cervicothoracic junction alignment is within normal limits. Bilateral posterior element alignment is within normal limits. Skull base and vertebrae: Visualized skull base is intact. No atlanto-occipital dissociation. C1 and C2 appear chronically degenerated, but intact and aligned. No acute osseous abnormality identified. Soft tissues and spinal canal: No prevertebral fluid or swelling. No visible canal hematoma. Calcified carotid bifurcation atherosclerosis. Negative other visible noncontrast neck soft tissues. Disc levels: Bulky and advanced cervical spine degeneration superimposed on chronic degenerative appearing ankylosis of C5-C6. Bulky disc and endplate disease with mild to moderate cervical spinal stenosis suspected C3-C4 through C5-C6. Probably not significant changed compared to the 2021 CT. Upper chest: Visible upper thoracic levels appear intact. Upper lungs are clear. There is some retained fluid in the upper thoracic esophagus. IMPRESSION: 1. No acute traumatic injury identified in the cervical spine. 2. Bulky cervical spine degeneration superimposed on chronic C5-C6 ankylosis. Chronic mild to moderate cervical spinal stenosis is suspected C3-C4  through C5-C6. Electronically Signed   By: Odessa Fleming M.D.   On: 05/10/2023 06:19   DG Chest Port 1 View  Result Date: 05/10/2023 CLINICAL DATA:  72 year old male with hypertension, fall. Pain. EXAM: PORTABLE CHEST 1 VIEW COMPARISON:  Portable chest 03/19/2020 and earlier. FINDINGS: Portable AP supine view at 0503 hours. Lower lung volumes. Moderate chronic hiatal hernia. Other mediastinal contours are within normal limits. Visualized tracheal air column is within normal limits. Crowding of lung markings at both lung bases. No pneumothorax, pulmonary edema, consolidation, or pleural effusion. No acute osseous abnormality identified. IMPRESSION: 1. Low lung volumes with basilar atelectasis. 2. Chronic hiatal hernia. Electronically Signed   By: Odessa Fleming M.D.   On: 05/10/2023 06:15   DG Hip Unilat W or Wo Pelvis 2-3 Views Right  Result Date: 05/10/2023 CLINICAL DATA:  72 year old male with hypertension, fall. Pain. EXAM: DG HIP (WITH OR WITHOUT PELVIS) 2-3V RIGHT COMPARISON:  07/24/2009 pelvis and right hip series. FINDINGS: AP view of the pelvis with AP and cross-table lateral views of the right hip. Femoral heads are normally located. Hip joint spaces remain symmetric. Chronic acetabular degenerative spurring. No acute pelvis fracture identified. Aortoiliac vascular calcifications are parent. Grossly intact proximal left femur. Proximal right femur appears intact. No acute osseous abnormality identified. IMPRESSION: 1. No acute fracture or dislocation identified about the right hip or pelvis. 2. Aortoiliac atherosclerosis. Electronically Signed   By: Odessa Fleming M.D.   On: 05/10/2023 06:14   DG Abd 1 View  Result Date: 05/10/2023 CLINICAL DATA:  72 year old male with three days of abdominal pain. EXAM: ABDOMEN - 1 VIEW COMPARISON:  CT Abdomen and Pelvis 02/14/2017 and earlier. FINDINGS: AP views on 05/09/2023 1000 hours. Evidence of low lung volumes with crowding at both lung bases. Central abdominal  conglomeration of gas containing bowel loops appear mildly dilated and abnormal. This is probably small bowel. There is some gas in nondilated colon. No pneumoperitoneum is identified. No acute osseous abnormality identified. IMPRESSION: 1. Abnormal bowel gas pattern in the mid abdomen suspicious for small bowel  obstruction. See CT Abdomen and Pelvis 05/10/2023 reported separately. 2. No pneumoperitoneum identified.  Lung base atelectasis. Electronically Signed   By: Odessa Fleming M.D.   On: 05/10/2023 06:11   CT Head Wo Contrast  Result Date: 05/10/2023 CLINICAL DATA:  72 year old male with hypertension, fall.  Pain. EXAM: CT HEAD WITHOUT CONTRAST TECHNIQUE: Contiguous axial images were obtained from the base of the skull through the vertex without intravenous contrast. RADIATION DOSE REDUCTION: This exam was performed according to the departmental dose-optimization program which includes automated exposure control, adjustment of the mA and/or kV according to patient size and/or use of iterative reconstruction technique. COMPARISON:  Head CT 11/17/2022. FINDINGS: Brain: Small chronic left side subdural fluid collection is mostly low-density and has not significantly changed since May, measuring 4-5 mm at most levels (series 3, image 23). Smaller contralateral low-density right subdural collection is also possible, 2-3 mm, and also stable. No midline shift or significant intracranial mass effect. No ventriculomegaly. Basilar cisterns remain patent. No acute intracranial hemorrhage identified. No cortically based acute infarct identified. Chronic small vessel ischemia including Patchy and confluent bilateral white matter hypodensity, chronic lacunar infarct of the left thalamus. Heterogeneity in the brainstem. Stable gray-white matter differentiation throughout the brain. Vascular: Calcified atherosclerosis at the skull base. No suspicious intracranial vascular hyperdensity. Skull: No fracture identified. Impacted  left maxillary tooth incidentally noted. Sinuses/Orbits: Visualized paranasal sinuses and mastoids are stable and well aerated. Other: No orbit or scalp soft tissue injury identified. IMPRESSION: 1. No acute intracranial abnormality or acute traumatic injury identified. 2. Small chronic bilateral subdural fluid collections are suspected and stable since May (4-5 mm on the left, 2-3 mm on the right). 3. Advanced chronic small vessel disease appears stable by CT. Electronically Signed   By: Odessa Fleming M.D.   On: 05/10/2023 06:08    Micro Results     Recent Results (from the past 240 hour(s))  SARS Coronavirus 2 by RT PCR (hospital order, performed in [facility] hospital lab) *cepheid single result test* Nasopharyngeal Swab     Status: None   Collection Time: 05/10/23  8:53 AM   Specimen: Nasopharyngeal Swab; Nasal Swab  Result Value Ref Range Status   SARS Coronavirus 2 by RT PCR NEGATIVE NEGATIVE Final    Comment: Performed at [facility], [address] [place], [place] [address]  Urine Culture     Status: None   Collection Time: 05/10/23  4:48 PM   Specimen: Urine, Random  Result Value Ref Range Status   Specimen Description URINE, RANDOM  Final   Special Requests NONE Reflexed from [id number]  Final   Culture   Final    NO GROWTH Performed at [facility], [address] [place], [place] [address]    Report Status 05/11/2023 FINAL  Final    Today   Subjective    Saunders Heinzman today has no headache,no chest abdominal pain,no new weakness tingling or numbness, feels much better wants to go home today.     Objective   Blood pressure 160/55, pulse 69, temperature 98.1 F (36.7 C), temperature source Oral, resp. rate 19, height 5\' 9"  (1.753 m), weight 93.4 kg, SpO2 93%.   Intake/Output Summary (Last 24 hours) at 05/15/2023 1037 Last data filed at 05/15/2023 0800 Gross per 24 hour  Intake 240 ml  Output 1000 ml  Net -760 ml    Exam  Awake Alert, No new F.N  deficits,    [place].AT,PERRAL Supple Neck,  Symmetrical Chest wall movement, Good air movement bilaterally, CTAB RRR,No Gallops,   +ve B.Sounds, Abd Soft, Non tender,  No Cyanosis, Clubbing or edema    Data Review   Recent Labs  Lab 05/10/23 0504 05/10/23 0853 05/11/23 0853 05/12/23 0549 05/13/23 0305 05/14/23 0524 05/15/23 0242  WBC 14.9*   < > 7.2 5.9 6.7 5.9 5.7  HGB 7.3*   < > 8.0* 8.1* 7.8* 8.0* 8.5*  HCT 29.1*   < > 28.6* 30.5* 29.2* 29.7* 31.3*  PLT 479*   < > 360 375 394 427* 426*  MCV 68.1*   < > 66.4* 68.1* 68.1* 68.3* 69.7*  MCH 17.1*   < > 18.6* 18.1* 18.2* 18.4* 18.9*  MCHC 25.1*   < > 28.0* 26.6* 26.7* 26.9* 27.2*  RDW 19.9*   < > 20.8* 20.6* 20.7* 20.7* 21.4*  LYMPHSABS 0.2*  --   --  0.8 1.0 1.4 1.5  MONOABS 0.9  --   --  0.8 0.6 0.4 0.5  EOSABS 0.0  --   --  0.1 0.1 0.1 0.1  BASOSABS 0.0  --   --  0.0 0.0 0.0 0.1   < > = values in this interval not displayed.    Recent Labs  Lab 05/10/23 0501 05/10/23 0501 05/10/23 0504 05/10/23 0701 05/10/23 1103 05/11/23 0326 05/11/23 0853 05/12/23 0549 05/13/23 0305 05/14/23 0524 05/15/23 0242  NA 139  --  140  --   --  141  --  140 137 138 138  K 3.3*  --  3.4*  --   --  3.3*  --  3.6 3.3* 3.9 4.1  CL 99  --  100  --   --  95*  --  102 104 107 110  CO2  --    < > 18*  --   --  33*  --  30 24 21* 20*  ANIONGAP  --    < > 22*  --   --  13  --  8 9 10 8   GLUCOSE 126*  --  127*  --   --  106*  --  86 89 112* 92  BUN 24*  --  25*  --   --  43*  --  26* 11 11 11   CREATININE 3.60*  --  3.46*  --   --  2.91*  --  1.34* 0.98 1.07 1.01  AST  --   --  22  --   --  52*  --   --   --   --   --   ALT  --   --  11  --   --  55*  --   --   --   --   --   ALKPHOS  --   --  74  --   --  64  --   --   --   --   --   BILITOT  --   --  0.6  --   --  0.6  --   --   --   --   --   ALBUMIN  --   --  3.4*  --   --  3.2*  --   --   --   --   --   CRP  --   --   --   --   --   --   --  13.9* 6.4* 3.0* 1.7*  DDIMER  --   --  <  0.27   --   --   --   --   --   --   --   --   PROCALCITON  --   --   --   --   --   --   --  2.11 1.00 0.30 0.18  LATICACIDVEN 11.9*  --   --  7.0* 3.3*  --  1.4  --   --   --   --   BNP  --   --   --   --   --   --   --  189.0* 230.9* 94.5 67.3  MG  --   --   --   --   --  2.0  --  2.4 2.1 2.1 2.0  CALCIUM  --    < > 9.7  --   --  8.3*  --  8.7* 8.6* 8.5* 8.4*   < > = values in this interval not displayed.    Total Time in preparing paper work, data evaluation and todays exam - 35 minutes  Signature  -    Susa Raring M.D on 05/15/2023 at 10:37 AM   -  To page go to [email or web address]

## 2023-05-15 NOTE — Plan of Care (Signed)

## 2023-08-03 ENCOUNTER — Emergency Department (HOSPITAL_COMMUNITY): Payer: 59

## 2023-08-03 ENCOUNTER — Other Ambulatory Visit: Payer: Self-pay

## 2023-08-03 ENCOUNTER — Encounter (HOSPITAL_COMMUNITY): Payer: Self-pay

## 2023-08-03 ENCOUNTER — Emergency Department (HOSPITAL_COMMUNITY)
Admission: EM | Admit: 2023-08-03 | Discharge: 2023-08-04 | Disposition: A | Discharge status: Home / Self Care | Payer: 59 | Attending: Emergency Medicine | Admitting: Emergency Medicine

## 2023-08-03 DIAGNOSIS — M47814 Spondylosis without myelopathy or radiculopathy, thoracic region: Secondary | ICD-10-CM | POA: Diagnosis not present

## 2023-08-03 DIAGNOSIS — R8290 Unspecified abnormal findings in urine: Secondary | ICD-10-CM | POA: Diagnosis not present

## 2023-08-03 DIAGNOSIS — E785 Hyperlipidemia, unspecified: Secondary | ICD-10-CM | POA: Insufficient documentation

## 2023-08-03 DIAGNOSIS — M47812 Spondylosis without myelopathy or radiculopathy, cervical region: Secondary | ICD-10-CM | POA: Insufficient documentation

## 2023-08-03 DIAGNOSIS — R4781 Slurred speech: Secondary | ICD-10-CM | POA: Diagnosis present

## 2023-08-03 DIAGNOSIS — I2510 Atherosclerotic heart disease of native coronary artery without angina pectoris: Secondary | ICD-10-CM | POA: Diagnosis not present

## 2023-08-03 DIAGNOSIS — R531 Weakness: Secondary | ICD-10-CM | POA: Insufficient documentation

## 2023-08-03 DIAGNOSIS — E042 Nontoxic multinodular goiter: Secondary | ICD-10-CM | POA: Diagnosis not present

## 2023-08-03 DIAGNOSIS — I6782 Cerebral ischemia: Secondary | ICD-10-CM | POA: Insufficient documentation

## 2023-08-03 DIAGNOSIS — I69354 Hemiplegia and hemiparesis following cerebral infarction affecting left non-dominant side: Secondary | ICD-10-CM | POA: Insufficient documentation

## 2023-08-03 DIAGNOSIS — I517 Cardiomegaly: Secondary | ICD-10-CM | POA: Diagnosis not present

## 2023-08-03 DIAGNOSIS — I700 Atherosclerosis of aorta: Secondary | ICD-10-CM | POA: Insufficient documentation

## 2023-08-03 DIAGNOSIS — N3000 Acute cystitis without hematuria: Secondary | ICD-10-CM

## 2023-08-03 DIAGNOSIS — K2100 Gastro-esophageal reflux disease with esophagitis, without bleeding: Secondary | ICD-10-CM | POA: Insufficient documentation

## 2023-08-03 LAB — DIFFERENTIAL
Abs Immature Granulocytes: 0 10*3/uL (ref 0.00–0.07)
Basophils Absolute: 0 10*3/uL (ref 0.0–0.1)
Basophils Relative: 1 %
Eosinophils Absolute: 0.1 10*3/uL (ref 0.0–0.5)
Eosinophils Relative: 2 %
Immature Granulocytes: 0 %
Lymphocytes Relative: 29 %
Lymphs Abs: 1 10*3/uL (ref 0.7–4.0)
Monocytes Absolute: 0.3 10*3/uL (ref 0.1–1.0)
Monocytes Relative: 9 %
Neutro Abs: 2.1 10*3/uL (ref 1.7–7.7)
Neutrophils Relative %: 59 %
Smear Review: NORMAL

## 2023-08-03 LAB — COMPREHENSIVE METABOLIC PANEL
ALT: 12 U/L (ref 0–44)
AST: 16 U/L (ref 15–41)
Albumin: 3.6 g/dL (ref 3.5–5.0)
Alkaline Phosphatase: 101 U/L (ref 38–126)
Anion gap: 10 (ref 5–15)
BUN: 9 mg/dL (ref 8–23)
CO2: 21 mmol/L — ABNORMAL LOW (ref 22–32)
Calcium: 8.7 mg/dL — ABNORMAL LOW (ref 8.9–10.3)
Chloride: 112 mmol/L — ABNORMAL HIGH (ref 98–111)
Creatinine, Ser: 1.03 mg/dL (ref 0.61–1.24)
GFR, Estimated: >60 mL/min (ref >60)
Glucose, Bld: 101 mg/dL — ABNORMAL HIGH (ref 70–99)
Potassium: 3.7 mmol/L (ref 3.5–5.1)
Sodium: 143 mmol/L (ref 135–145)
Total Bilirubin: 0.7 mg/dL (ref 0.0–1.2)
Total Protein: 6.9 g/dL (ref 6.5–8.1)

## 2023-08-03 LAB — I-STAT CHEM 8, ED
BUN: 10 mg/dL (ref 8–23)
Calcium, Ion: 1.1 mmol/L — ABNORMAL LOW (ref 1.15–1.40)
Chloride: 110 mmol/L (ref 98–111)
Creatinine, Ser: 1.1 mg/dL (ref 0.61–1.24)
Glucose, Bld: 98 mg/dL (ref 70–99)
HCT: 32 % — ABNORMAL LOW (ref 39.0–52.0)
Hemoglobin: 10.9 g/dL — ABNORMAL LOW (ref 13.0–17.0)
Potassium: 3.7 mmol/L (ref 3.5–5.1)
Sodium: 144 mmol/L (ref 135–145)
TCO2: 22 mmol/L (ref 22–32)

## 2023-08-03 LAB — CK: Total CK: 54 U/L (ref 49–397)

## 2023-08-03 LAB — APTT: aPTT: 39 s — ABNORMAL HIGH (ref 24–36)

## 2023-08-03 LAB — ETHANOL: Alcohol, Ethyl (B): <10 mg/dL (ref <10)

## 2023-08-03 LAB — URINALYSIS, ROUTINE W REFLEX MICROSCOPIC
Bilirubin Urine: NEGATIVE
Glucose, UA: NEGATIVE mg/dL
Hgb urine dipstick: NEGATIVE
Ketones, ur: NEGATIVE mg/dL
Nitrite: POSITIVE — AB
Protein, ur: NEGATIVE mg/dL
Specific Gravity, Urine: 1.015 (ref 1.005–1.030)
pH: 6 (ref 5.0–8.0)

## 2023-08-03 LAB — CBC
HCT: 32.8 % — ABNORMAL LOW (ref 39.0–52.0)
Hemoglobin: 8.5 g/dL — ABNORMAL LOW (ref 13.0–17.0)
MCH: 18.7 pg — ABNORMAL LOW (ref 26.0–34.0)
MCHC: 25.9 g/dL — ABNORMAL LOW (ref 30.0–36.0)
MCV: 72.2 fL — ABNORMAL LOW (ref 80.0–100.0)
Platelets: 387 10*3/uL (ref 150–400)
RBC: 4.54 MIL/uL (ref 4.22–5.81)
RDW: 21.4 % — ABNORMAL HIGH (ref 11.5–15.5)
WBC: 3.6 10*3/uL — ABNORMAL LOW (ref 4.0–10.5)
nRBC: 0 % (ref 0.0–0.2)

## 2023-08-03 LAB — PROTIME-INR
INR: 1.4 — ABNORMAL HIGH (ref 0.8–1.2)
Prothrombin Time: 17.1 s — ABNORMAL HIGH (ref 11.4–15.2)

## 2023-08-03 MED ORDER — CEPHALEXIN 500 MG PO CAPS
500.0000 mg | ORAL_CAPSULE | Freq: Four times a day (QID) | ORAL | 0 refills | Status: DC
Start: 2023-08-03 — End: 2023-12-14

## 2023-08-03 MED ORDER — SODIUM CHLORIDE 0.9% FLUSH
3.0000 mL | Freq: Once | INTRAVENOUS | Status: AC
  Administered 2023-08-03: 3 mL via INTRAVENOUS

## 2023-08-03 MED ORDER — SODIUM CHLORIDE 0.9 % IV BOLUS
1000.0000 mL | Freq: Once | INTRAVENOUS | Status: AC
  Administered 2023-08-03: 1000 mL via INTRAVENOUS

## 2023-08-03 MED ORDER — IOHEXOL 350 MG/ML SOLN
50.0000 mL | Freq: Once | INTRAVENOUS | Status: AC | PRN
  Administered 2023-08-03: 50 mL via INTRAVENOUS

## 2023-08-03 MED ORDER — SODIUM CHLORIDE 0.9 % IV SOLN
1.0000 g | Freq: Once | INTRAVENOUS | Status: AC
  Administered 2023-08-03: 1 g via INTRAVENOUS
  Filled 2023-08-03: qty 10

## 2023-08-03 MED ORDER — LORAZEPAM 1 MG PO TABS
1.0000 mg | ORAL_TABLET | Freq: Once | ORAL | Status: AC
  Administered 2023-08-03: 1 mg via ORAL
  Filled 2023-08-03: qty 1

## 2023-08-03 NOTE — ED Notes (Signed)
 Patient had 3 soiled depends on that were saturated with urine.

## 2023-08-03 NOTE — ED Provider Notes (Signed)
[name] EMERGENCY DEPARTMENT AT [facility] Provider Note   CSN: [id number] Arrival date & time: 08/03/23  1408  An emergency department physician performed an initial assessment on this suspected stroke patient at 89.  History  Chief Complaint  Patient presents with   Code Stroke    [name] is a 73 y.o. male.  HPI 73 year old male history of prior stroke, hyperlipidemia, GERD presenting for generalized weakness and difficulty with speech.  Patient arrives as code stroke.  Patient states for the last few days he has felt generalized weakness and felt like he has difficulty getting up.  Today his brother came to check on him and noted that he had difficulty with speech and possible slurred speech.  Patient has not had any headache or focal weakness or numbness or vision changes.  No fevers or chills.  No chest pain or abdominal pain or shortness of breath.  He is otherwise been at his baseline health.     Home Medications Prior to Admission medications   Medication Sig Start Date End Date Taking? Authorizing Provider  acetaminophen  (TYLENOL ) 325 MG tablet Take 2 tablets (650 mg total) by mouth every 6 (six) hours as needed for moderate pain or headache. 12/27/20   Amin, Ankit C, MD  buPROPion  (WELLBUTRIN  SR) 200 MG 12 hr tablet TAKE 1 TABLET BY MOUTH DAILY 02/19/22   Viola Greulich, MD  carvedilol  (COREG ) 3.125 MG tablet Take 1 tablet (3.125 mg total) by mouth 2 (two) times daily with a meal. 05/15/23   Cala Castleman, MD  cyanocobalamin  1000 MCG tablet Take 1 tablet (1,000 mcg total) by mouth daily. 05/16/23   Cala Castleman, MD  ferrous sulfate  325 (65 FE) MG tablet Take 1 tablet (325 mg total) by mouth daily with breakfast. 05/15/23   Singh, Prashant K, MD  folic acid  (FOLVITE ) 1 MG tablet Take 1 tablet (1 mg total) by mouth daily. 05/16/23   Singh, Prashant K, MD  furosemide  (LASIX ) 20 MG tablet Take 1 tablet (20 mg total) by mouth 2 (two) times daily.  09/28/21   Viola Greulich, MD  hydrALAZINE  (APRESOLINE ) 50 MG tablet Take 50 mg by mouth 3 (three) times daily.    [provider]  ketoconazole (NIZORAL) 2 % cream Apply topically. 05/03/23   [provider]  nicotine  (NICODERM CQ  - DOSED IN MG/24 HOURS) 21 mg/24hr patch Place 1 patch (21 mg total) onto the skin daily. 05/16/23   Singh, Prashant K, MD  ondansetron  (ZOFRAN ) 4 MG tablet Take 4 mg by mouth every 8 (eight) hours as needed. 05/09/23   [provider]  pantoprazole  (PROTONIX ) 40 MG tablet TAKE 1 TABLET BY MOUTH TWICE A DAY BEFORE MEALS 03/03/22   Viola Greulich, MD  QUEtiapine  (SEROQUEL ) 200 MG tablet Take 1 tablet (200 mg total) by mouth at bedtime. 02/12/22   Viola Greulich, MD  rosuvastatin (CRESTOR) 5 MG tablet SMARTSIG:1 Tablet(s) By Mouth Every Evening 04/20/23   [provider]  sertraline  (ZOLOFT ) 100 MG tablet Take 1 tablet (100 mg total) by mouth daily. 02/05/22   Rawland Caddy, MD  spironolactone  (ALDACTONE ) 25 MG tablet Take 1 tablet (25 mg total) by mouth daily. 12/03/21 05/11/23  Bridgette Campus, MD  Vitamin D, Ergocalciferol, 50000 units CAPS Take 1 capsule by mouth once a week. 04/20/23   [provider]      Allergies    Sudafed [pseudoephedrine]    Review of Systems  Review of Systems Review of systems completed and notable as per HPI.  ROS otherwise negative.   Physical Exam Updated Vital Signs BP (!) 175/79   Pulse 71   Temp 98.8 F (37.1 C) (Oral)   Resp 18   Ht 5\' 9"  (1.753 m)   Wt 92.1 kg   SpO2 100%   BMI 29.98 kg/m  Physical Exam Vitals and nursing note reviewed.  Constitutional:      General: He is not in acute distress.    Appearance: He is well-developed.  HENT:     Head: Normocephalic and atraumatic.     Nose: Nose normal.     Mouth/Throat:     Mouth: Mucous membranes are dry.     Pharynx: Oropharynx is clear.  Eyes:     Extraocular Movements: Extraocular movements intact.      Conjunctiva/sclera: Conjunctivae normal.     Pupils: Pupils are equal, round, and reactive to light.  Cardiovascular:     Rate and Rhythm: Normal rate and regular rhythm.     Pulses: Normal pulses.     Heart sounds: Normal heart sounds. No murmur heard. Pulmonary:     Effort: Pulmonary effort is normal. No respiratory distress.     Breath sounds: Normal breath sounds.  Abdominal:     Palpations: Abdomen is soft.     Tenderness: There is no abdominal tenderness. There is no guarding or rebound.  Musculoskeletal:        General: No swelling.     Cervical back: Neck supple.  Skin:    General: Skin is warm and dry.     Capillary Refill: Capillary refill takes less than 2 seconds.  Neurological:     General: No focal deficit present.     Mental Status: He is alert and oriented to person, place, and time.     Cranial Nerves: No cranial nerve deficit.     Sensory: No sensory deficit.     Motor: No weakness.     Coordination: Coordination normal.     Comments: Patient is awake and alert.  He does not have any focal deficits.  He is alert and oriented.  He has some slight slurring of his speech which may be mechanical due to very dry mouth and throat.  No gross aphasia.  No cranial nerve deficits.  No visual field cuts, neglect, focal weakness or numbness.  Psychiatric:        Mood and Affect: Mood normal.     ED Results / Procedures / Treatments   Labs (all labs ordered are listed, but only abnormal results are displayed) Labs Reviewed  PROTIME-INR - Abnormal; Notable for the following components:      Result Value   Prothrombin Time 17.1 (*)    INR 1.4 (*)    All other components within normal limits  APTT - Abnormal; Notable for the following components:   aPTT 39 (*)    All other components within normal limits  CBC - Abnormal; Notable for the following components:   WBC 3.6 (*)    Hemoglobin 8.5 (*)    HCT 32.8 (*)    MCV 72.2 (*)    MCH 18.7 (*)    MCHC 25.9 (*)    RDW  21.4 (*)    All other components within normal limits  COMPREHENSIVE METABOLIC PANEL - Abnormal; Notable for the following components:   Chloride 112 (*)    CO2 21 (*)    Glucose, Bld 101 (*)  Calcium  8.7 (*)    All other components within normal limits  I-STAT CHEM 8, ED - Abnormal; Notable for the following components:   Calcium , Ion 1.10 (*)    Hemoglobin 10.9 (*)    HCT 32.0 (*)    All other components within normal limits  DIFFERENTIAL  ETHANOL  URINALYSIS, ROUTINE W REFLEX MICROSCOPIC  CK  CBG MONITORING, ED    EKG EKG Interpretation Date/Time:  Wednesday August 03 2023 15:15:44 EST Ventricular Rate:  69 PR Interval:  220 QRS Duration:  98 QT Interval:  430 QTC Calculation: 461 R Axis:   -3  Text Interpretation: Sinus rhythm Prolonged PR interval Abnormal R-wave progression, early transition Left ventricular hypertrophy No significant change since last tracing Confirmed by Rueben Cote [phone]) on 08/03/2023 3:59:33 PM  Radiology CT HEAD CODE STROKE WO CONTRAST Result Date: 08/03/2023 CLINICAL DATA:  Code stroke. Provided history: Neuro deficit, acute, stroke suspected. Slurred speech. EXAM: CT ANGIOGRAPHY HEAD AND NECK TECHNIQUE: Multidetector CT imaging of the head and neck was performed using the standard protocol during bolus administration of intravenous contrast. Multiplanar CT image reconstructions and MIPs were obtained to evaluate the vascular anatomy. Carotid stenosis measurements (when applicable) are obtained utilizing NASCET criteria, using the distal internal carotid diameter as the denominator. RADIATION DOSE REDUCTION: This exam was performed according to the departmental dose-optimization program which includes automated exposure control, adjustment of the mA and/or kV according to patient size and/or use of iterative reconstruction technique. CONTRAST:  50mL OMNIPAQUE  IOHEXOL  350 MG/ML SOLN COMPARISON:  Head CT 05/10/2023.  CT angiogram head/neck  07/21/2018. FINDINGS: CT HEAD FINDINGS Brain: Mild generalized cerebral atrophy. Chronic low-density subdural collection overlying the left cerebral convexity, unchanged from the prior head CT of 05/10/2023, again measuring up to 5 mm in thickness (for instance as seen on series 3, image 19). No midline shift. Chronic lacunar infarcts again demonstrated within the bilateral centrum semiovale bilateral deep gray nuclei. Background mild-to-moderate patchy and ill-defined hypoattenuation within the cerebral white matter, nonspecific but compatible with chronic small vessel disease. There is no acute intracranial hemorrhage. No demarcated cortical infarct. No evidence of an intracranial mass. No midline shift. Vascular: No hyperdense vessel.  Atherosclerotic calcifications. Skull: No calvarial fracture or aggressive osseous lesion. Sinuses/Orbits: No mass or acute finding within the imaged orbits. No significant paranasal sinus disease. Review of the MIP images confirms the above findings ASPECTS [facility] Stroke Program Early CT Score) - Ganglionic level infarction (caudate, lentiform nuclei, internal capsule, insula, M1-M3 cortex): 7 - Supraganglionic infarction (M4-M6 cortex): 3 Total score (0-10 with 10 being normal): 10 No evidence of an acute intracranial abnormality. These results were communicated to Dr. Alecia Ames at 2:48 pmon 1/15/2025by text page via the [facility] messaging system. CTA NECK FINDINGS Aortic arch: Common origin of the innominate and left common carotid arteries. Atherosclerotic plaque within the visualized thoracic aorta and within proximal major branch vessels of the neck. No hemodynamically significant innominate or proximal subclavian artery stenosis. Right carotid system: CCA and ICA patent within the neck without hemodynamically significant stenosis (50% or greater). Mild to moderate atherosclerotic plaque about the carotid bifurcation and within the proximal ICA. Left carotid system: CCA and  ICA patent within the neck without hemodynamically significant stenosis (50% or greater). Mild atherosclerotic plaque at the CCA origin. Mild-to-moderate atherosclerotic plaque about the carotid bifurcation and within the proximal ICA. Vertebral arteries: Codominant and patent within the neck. Atherosclerotic plaque within the right vertebral artery at the V3/V4 junction with moderate stenosis, progressed  from the prior examination of 07/21/2018. 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 Progressive superimposed atherosclerotic plaque at this site, now with severe stenosis. Skeleton: The patient is edentulous. Spondylosis at the cervical and visualized upper thoracic levels. Other neck: Subcentimeter thyroid  nodules not meeting consensus criteria for ultrasound follow-up based on size. No follow-up imaging recommended. Reference: J Am Coll Radiol. 2015 Feb;12(2): 143-50. Upper chest: No consolidation within the imaged lung apices. Review of the MIP images confirms the above findings CTA HEAD FINDINGS Anterior circulation: The intracranial internal carotid arteries are patent. Atherosclerotic plaque within both vessels with no more than mild stenosis. The M1 middle cerebral arteries are patent. No M2 proximal branch occlusion or high-grade proximal stenosis. The anterior cerebral arteries are patent. No intracranial aneurysm is identified. Posterior circulation: The intracranial vertebral arteries are patent. Findings at the bilateral vertebral artery V3/V4 junctions as described above under the CTA neck findings section. The basilar artery is patent. The posterior cerebral arteries are patent. Posterior communicating arteries are diminutive or absent, bilaterally. Venous sinuses: Within the limitations of contrast timing, no convincing thrombus. Anatomic variants: As described. Review of the MIP images confirms the above  findings No emergent large vessel occlusion identified. 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 Alecia Ames at 3:12 pmon 08/03/2023 by text page via the [facility] messaging system. IMPRESSION: Non-contrast head CT: 1. No evidence of an acute intracranial abnormality. 2. Parenchymal atrophy and chronic small vessel ischemic disease as described. 3. Chronic subdural collection overlying the left cerebral convexity, unchanged from the prior head CT of 05/10/2023, again measuring up to 5 mm in thickness. No midline shift. CTA neck: 1. The vertebral arteries are patent within the neck. 2. 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 Superimposed atherosclerotic plaque at this site, which has progressed from the prior CTA of 07/21/2018 and now contributes to severe stenosis. 3. Progressive atherosclerotic plaque within the right vertebral artery at the V3/V4 junction, now with moderate stenosis. 4. The common carotid and internal carotid arteries are patent within the neck without hemodynamically significant stenosis. Atherosclerotic plaque bilaterally, as described. CTA head: 1. No proximal intracranial large vessel occlusion identified. 2. 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 Superimposed atherosclerotic plaque at this site, which has progressed from the prior CTA of 07/21/2018 and now contributes to severe stenosis. 3. Progressive atherosclerotic plaque within the right vertebral artery at the V3/V4 junction, now with moderate stenosis. Electronically Signed   By: Bascom Lily D.O.   On: 08/03/2023 15:13   CT ANGIO HEAD NECK W WO CM (CODE STROKE) Result Date: 08/03/2023 CLINICAL DATA:  Code stroke. Provided history: Neuro deficit, acute, stroke suspected. Slurred speech. EXAM: CT ANGIOGRAPHY HEAD AND NECK TECHNIQUE: Multidetector CT imaging of the head  and neck was performed using the standard protocol during bolus administration of intravenous contrast. Multiplanar CT image reconstructions and MIPs were obtained to evaluate the vascular anatomy. Carotid stenosis measurements (when applicable) are obtained utilizing NASCET criteria, using the distal internal carotid diameter as the denominator. RADIATION DOSE REDUCTION: This exam was performed according to the departmental dose-optimization program which includes automated exposure control, adjustment of the mA and/or kV according to patient size and/or use of iterative reconstruction technique. CONTRAST:  50mL OMNIPAQUE  IOHEXOL  350 MG/ML SOLN COMPARISON:  Head CT 05/10/2023.  CT angiogram head/neck 07/21/2018. FINDINGS: CT HEAD FINDINGS Brain: Mild generalized cerebral atrophy. Chronic low-density subdural collection overlying the left cerebral convexity, unchanged from the prior head CT of 05/10/2023, again measuring up to 5 mm in thickness (for instance as seen on series 3, image 19). No midline shift. Chronic lacunar infarcts again demonstrated within the bilateral centrum semiovale bilateral deep gray nuclei. Background mild-to-moderate patchy and ill-defined hypoattenuation within the cerebral white matter, nonspecific but compatible with chronic small vessel disease. There is no acute intracranial hemorrhage. No demarcated cortical infarct. No evidence of an intracranial mass. No midline shift. Vascular: No hyperdense vessel.  Atherosclerotic calcifications. Skull: No calvarial fracture or aggressive osseous lesion. Sinuses/Orbits: No mass or acute finding within the imaged orbits. No significant paranasal sinus disease. Review of the MIP images confirms the above findings ASPECTS [facility] Stroke Program Early CT Score) - Ganglionic level infarction (caudate, lentiform nuclei, internal capsule, insula, M1-M3 cortex): 7 - Supraganglionic infarction (M4-M6 cortex): 3 Total score (0-10 with 10 being normal):  10 No evidence of an acute intracranial abnormality. These results were communicated to Dr. Alecia Ames at 2:48 pmon 1/15/2025by text page via the [facility] messaging system. CTA NECK FINDINGS Aortic arch: Common origin of the innominate and left common carotid arteries. Atherosclerotic plaque within the visualized thoracic aorta and within proximal major branch vessels of the neck. No hemodynamically significant innominate or proximal subclavian artery stenosis. Right carotid system: CCA and ICA patent within the neck without hemodynamically significant stenosis (50% or greater). Mild to moderate atherosclerotic plaque about the carotid bifurcation and within the proximal ICA. Left carotid system: CCA and ICA patent within the neck without hemodynamically significant stenosis (50% or greater). Mild atherosclerotic plaque at the CCA origin. Mild-to-moderate atherosclerotic plaque about the carotid bifurcation and within the proximal ICA. Vertebral arteries: Codominant and patent within the neck. Atherosclerotic plaque within the right vertebral artery at the V3/V4 junction with moderate stenosis, progressed from the prior examination of 07/21/2018. 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 Progressive superimposed atherosclerotic plaque at this site, now with severe stenosis. Skeleton: The patient is edentulous. Spondylosis at the cervical and visualized upper thoracic levels. Other neck: Subcentimeter thyroid  nodules not meeting consensus criteria for ultrasound follow-up based on size. No follow-up imaging recommended. Reference: J Am Coll Radiol. 2015 Feb;12(2): 143-50. Upper chest: No consolidation within the imaged lung apices. Review of the MIP images confirms the above findings CTA HEAD FINDINGS Anterior circulation: The intracranial internal carotid arteries are patent. Atherosclerotic plaque within both vessels with no  more than mild stenosis. The M1 middle cerebral arteries are patent. No M2 proximal branch occlusion or high-grade proximal stenosis. The anterior cerebral arteries are patent. No intracranial aneurysm is identified. Posterior circulation: The intracranial vertebral arteries are patent. Findings at the bilateral vertebral artery V3/V4 junctions as described above under the CTA neck findings section. The basilar artery is patent. The posterior cerebral arteries are patent. Posterior communicating arteries are diminutive or absent, bilaterally. Venous sinuses: Within the limitations of contrast timing, no convincing thrombus. Anatomic variants: As described. Review of the MIP images confirms the above findings No emergent large vessel occlusion identified. 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 Alecia Ames at 3:12 pmon 08/03/2023 by text page via the [facility] messaging system. IMPRESSION: Non-contrast head CT: 1. No evidence of an acute intracranial abnormality. 2. Parenchymal atrophy and  chronic small vessel ischemic disease as described. 3. Chronic subdural collection overlying the left cerebral convexity, unchanged from the prior head CT of 05/10/2023, again measuring up to 5 mm in thickness. No midline shift. CTA neck: 1. The vertebral arteries are patent within the neck. 2. 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 Superimposed atherosclerotic plaque at this site, which has progressed from the prior CTA of 07/21/2018 and now contributes to severe stenosis. 3. Progressive atherosclerotic plaque within the right vertebral artery at the V3/V4 junction, now with moderate stenosis. 4. The common carotid and internal carotid arteries are patent within the neck without hemodynamically significant stenosis. Atherosclerotic plaque bilaterally, as described. CTA head: 1. No proximal intracranial large  vessel occlusion identified. 2. 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 Superimposed atherosclerotic plaque at this site, which has progressed from the prior CTA of 07/21/2018 and now contributes to severe stenosis. 3. Progressive atherosclerotic plaque within the right vertebral artery at the V3/V4 junction, now with moderate stenosis. Electronically Signed   By: Bascom Lily D.O.   On: 08/03/2023 15:13    Procedures Procedures    Medications Ordered in ED Medications  sodium chloride  flush (NS) 0.9 % injection 3 mL (3 mLs Intravenous Given 08/03/23 1525)  iohexol  (OMNIPAQUE ) 350 MG/ML injection 50 mL (50 mLs Intravenous Contrast Given 08/03/23 1430)  sodium chloride  0.9 % bolus 1,000 mL (1,000 mLs Intravenous New Bag/Given 08/03/23 1530)  LORazepam  (ATIVAN ) tablet 1 mg (1 mg Oral Given 08/03/23 1544)    ED Course/ Medical Decision Making/ A&P                                 Medical Decision Making Amount and/or Complexity of Data Reviewed Labs: ordered. Radiology: ordered.  Risk Prescription drug management.   Medical Decision Making:   [name] is a 73 y.o. male who presented to the ED today with concern for possible stroke.  He has hypertensive otherwise stable.  He has had a couple days of generalized weakness, fatigue and developed some difficulty with slurred speech today.  He is hard to tell if this is due to his very dry oropharynx versus actual speech difficulty.  He has no other focal deficits.  He was evaluated neurology at bedside, not felt to be TNK candidate and no evidence of LVO.  Formal read of CT and CTA are pending.  Neurology recommends MRI and further metabolic workup.  He does appear quite dry and has not eaten for last couple days.  However no fever, benign abdominal exam, no chest pain.  Will evaluate for possible metabolic cause.   Patient placed on continuous vitals and telemetry monitoring while in ED  which was reviewed periodically.  Reviewed and confirmed nursing documentation for past medical history, family history, social history.  Reassessment and Plan:   MRI and CT reads are pending as well as lab workup.  Handoff given to oncoming provider with plan to follow-up workup as well as neurology recommendations.   Patient's presentation is most consistent with acute complicated illness / injury requiring diagnostic workup.           Final Clinical Impression(s) / ED Diagnoses Final diagnoses:  Slurred speech    Rx / DC Orders ED Discharge Orders     None         Coleman Daughters, MD 08/03/23  1600  

## 2023-08-03 NOTE — Consult Note (Signed)
 NEUROLOGY CONSULT NOTE   Date of service: August 03, 2023 Patient Name: [name] MRN:  [id number] DOB:  [birth year] Chief Complaint: "Weakness" Requesting Provider: Mozell Arias, MD  History of Present Illness  [name] is a 73 y.o. male  has a past medical history of Arthritis, Donelda Fujita ulcer, chronic (01/11/2018), Chronic back pain, GERD (gastroesophageal reflux disease), GIB (gastrointestinal bleeding) (2012), Glaucoma, Headache, Hepatitis C, Hiatal hernia with gastroesophageal reflux disease and esophagitis (01/05/2018), History of blood transfusion, History of gout, Hyperlipidemia, Insomnia, Ischemic colitis (HCC) (2012), Joint pain, Nocturia, Numbness, PONV (postoperative nausea and vomiting), Prostate cancer (HCC), Shortness of breath dyspnea, Stroke (HCC) (2016 ), Urinary frequency, and Urinary urgency.  He was in his normal state of health per his nurses earlier today when they noticed he was having a little bit more slurred speech.  He states that he has not felt well since yesterday.  At baseline he is able to get himself from his bed to his wheelchair, but he was unable to do that today.  LKW: Unclear IV Thrombolysis: No, thalamic hemorrhage NIH SS: Four     Past History   Past Medical History:  Diagnosis Date   Arthritis    Donelda Fujita ulcer, chronic 01/11/2018   Chronic back pain    GERD (gastroesophageal reflux disease)    uses Baking Soda   GIB (gastrointestinal bleeding) 2012   Glaucoma    right eye   Headache    occasionally   Hepatitis C    Hiatal hernia with gastroesophageal reflux disease and esophagitis 01/05/2018   History of blood transfusion    no abnormal reaction noted   History of gout    doesn't take any meds   Hyperlipidemia    not on any meds   Insomnia    takes Ambien  nightly   Ischemic colitis (HCC) 2012   Joint pain    Nocturia    Numbness    both legs occasionally   PONV (postoperative nausea and vomiting)    Prostate  cancer (HCC)    Shortness of breath dyspnea    rarely but when notices he can be lying/sitting/exertion.Dr.Hochrein is aware per pt   Stroke [facility]) 2016    Urinary frequency    Urinary urgency     Past Surgical History:  Procedure Laterality Date   APPENDECTOMY     BIOPSY  01/11/2018   Procedure: BIOPSY;  Surgeon: Kenney Peacemaker, MD;  Location: [facility] ENDOSCOPY;  Service: Endoscopy;;   COLONOSCOPY     COLONOSCOPY N/A 10/26/2015   Procedure: COLONOSCOPY;  Surgeon: Albertina Hugger, MD;  Location: [facility] ENDOSCOPY;  Service: Endoscopy;  Laterality: N/A;   ESOPHAGOGASTRODUODENOSCOPY     ESOPHAGOGASTRODUODENOSCOPY N/A 10/26/2015   Procedure: ESOPHAGOGASTRODUODENOSCOPY (EGD);  Surgeon: Albertina Hugger, MD;  Location: [facility] ENDOSCOPY;  Service: Endoscopy;  Laterality: N/A;   ESOPHAGOGASTRODUODENOSCOPY (EGD) WITH PROPOFOL  N/A 01/11/2018   Procedure: ESOPHAGOGASTRODUODENOSCOPY (EGD) WITH PROPOFOL ;  Surgeon: Kenney Peacemaker, MD;  Location: [facility] ENDOSCOPY;  Service: Endoscopy;  Laterality: N/A;   ESOPHAGOGASTRODUODENOSCOPY (EGD) WITH PROPOFOL  N/A 12/26/2020   Procedure: ESOPHAGOGASTRODUODENOSCOPY (EGD) WITH PROPOFOL ;  Surgeon: Asencion Blacksmith, MD;  Location: [facility] ENDOSCOPY;  Service: Endoscopy;  Laterality: N/A;   HOT HEMOSTASIS N/A 12/26/2020   Procedure: HOT HEMOSTASIS (ARGON PLASMA COAGULATION/BICAP);  Surgeon: Asencion Blacksmith, MD;  Location: [facility] ENDOSCOPY;  Service: Endoscopy;  Laterality: N/A;   INGUINAL HERNIA REPAIR Right 11/04/2014   Procedure: RIGHT INGUINAL HERNIA REPAIR WITH MESH;  Surgeon: Adalberto Hollow,  MD;  Location: [facility] OR;  Service: General;  Laterality: Right;   MASS EXCISION N/A 11/04/2014   Procedure: REMOVAL OF RIGHT GROIN SOFT TISSUE MASS;  Surgeon: Adalberto Hollow, MD;  Location: [facility] OR;  Service: General;  Laterality: N/A;   Multiple abdominal surgeries     total of 13   PROSTATECTOMY     SMALL INTESTINE SURGERY      Family History: Family History  Problem Relation Age of Onset    Alzheimer's disease Father    Cancer Mother        Lymph node   Heart failure Brother 70       Transplant 13 years ago   CAD Brother    Heart disease Sister 35       MI    Social History  reports that he has been smoking cigarettes. He has a 25 pack-year smoking history. He has never used smokeless tobacco. He reports current alcohol use. He reports that he does not currently use drugs after having used the following drugs: Marijuana. Frequency: 5.00 times per week.  Allergies  Allergen Reactions   Sudafed [Pseudoephedrine] Palpitations and Other (See Comments)    Heart "races"    Medications  No current facility-administered medications for this encounter.  Current Outpatient Medications:    acetaminophen  (TYLENOL ) 325 MG tablet, Take 2 tablets (650 mg total) by mouth every 6 (six) hours as needed for moderate pain or headache., Disp: , Rfl:    buPROPion  (WELLBUTRIN  SR) 200 MG 12 hr tablet, TAKE 1 TABLET BY MOUTH DAILY, Disp: 28 tablet, Rfl: 0   carvedilol  (COREG ) 3.125 MG tablet, Take 1 tablet (3.125 mg total) by mouth 2 (two) times daily with a meal., Disp: 60 tablet, Rfl: 0   cyanocobalamin  1000 MCG tablet, Take 1 tablet (1,000 mcg total) by mouth daily., Disp: 30 tablet, Rfl: 0   ferrous sulfate  325 (65 FE) MG tablet, Take 1 tablet (325 mg total) by mouth daily with breakfast., Disp: 30 tablet, Rfl: 0   folic acid  (FOLVITE ) 1 MG tablet, Take 1 tablet (1 mg total) by mouth daily., Disp: 30 tablet, Rfl: 0   furosemide  (LASIX ) 20 MG tablet, Take 1 tablet (20 mg total) by mouth 2 (two) times daily., Disp: 60 tablet, Rfl: 3   hydrALAZINE  (APRESOLINE ) 50 MG tablet, Take 50 mg by mouth 3 (three) times daily., Disp: , Rfl:    ketoconazole (NIZORAL) 2 % cream, Apply topically., Disp: , Rfl:    nicotine  (NICODERM CQ  - DOSED IN MG/24 HOURS) 21 mg/24hr patch, Place 1 patch (21 mg total) onto the skin daily., Disp: 21 patch, Rfl: 0   ondansetron  (ZOFRAN ) 4 MG tablet, Take 4 mg by mouth every  8 (eight) hours as needed., Disp: , Rfl:    pantoprazole  (PROTONIX ) 40 MG tablet, TAKE 1 TABLET BY MOUTH TWICE A DAY BEFORE MEALS, Disp: 56 tablet, Rfl: 2   QUEtiapine  (SEROQUEL ) 200 MG tablet, Take 1 tablet (200 mg total) by mouth at bedtime., Disp: 30 tablet, Rfl: 0   rosuvastatin (CRESTOR) 5 MG tablet, SMARTSIG:1 Tablet(s) By Mouth Every Evening, Disp: , Rfl:    sertraline  (ZOLOFT ) 100 MG tablet, Take 1 tablet (100 mg total) by mouth daily., Disp: 30 tablet, Rfl: 6   spironolactone  (ALDACTONE ) 25 MG tablet, Take 1 tablet (25 mg total) by mouth daily., Disp: 90 tablet, Rfl: 3   Vitamin D, Ergocalciferol, 50000 units CAPS, Take 1 capsule by mouth once a week., Disp: , Rfl:   Vitals  Vitals:   08/03/23 1400 08/03/23 1444 08/03/23 1530 08/03/23 1855  BP:  (!) 186/92 (!) 175/79 (!) 166/73  Pulse:  85 71 64  Resp:  18 18 15   Temp:  98.8 F (37.1 C)  (!) 97.4 F (36.3 C)  TempSrc:  Oral  Oral  SpO2:  100% 100% 100%  Weight: 92.2 kg 92.1 kg    Height:  5\' 9"  (1.753 m)      Body mass index is 29.98 kg/m.  Physical Exam   Constitutional: Appears well-developed and well-nourished.  Neurologic Examination    Neuro: Mental Status: Patient is awake, alert, oriented to person, place, month, year, and situation. Patient is able to give a clear and coherent history. He may have very mild dysarthria, but it could be that this is due to dry mouth. Cranial Nerves: II: Visual Fields are full. Pupils are equal, round, and reactive to light.   III,IV, VI: EOMI without ptosis or diploplia.  V: Facial sensation is symmetric to temperature VII: Facial movement is weak in the left VIII: hearing is intact to voice X: Uvula elevates symmetrically XII: tongue is midline without atrophy or fasciculations.  Motor: He has mild left hemiparesis (baseline) sensory: Sensation is symmetric to light touch and temperature in the arms and legs.         Labs/Imaging/Neurodiagnostic studies    CBC:  Recent Labs  Lab 08/03/23 1410 08/03/23 1416  WBC 3.6*  --   NEUTROABS 2.1  --   HGB 8.5* 10.9*  HCT 32.8* 32.0*  MCV 72.2*  --   PLT 387  --    Basic Metabolic Panel:  Lab Results  Component Value Date   NA 144 08/03/2023   K 3.7 08/03/2023   CO2 21 (L) 08/03/2023   GLUCOSE 98 08/03/2023   BUN 10 08/03/2023   CREATININE 1.10 08/03/2023   CALCIUM  8.7 (L) 08/03/2023   GFRNONAA >60 08/03/2023   GFRAA 72 03/31/2020   Lipid Panel:  Lab Results  Component Value Date   LDLCALC 47 06/17/2021   HgbA1c:  Lab Results  Component Value Date   HGBA1C 4.7 (L) 07/22/2018   Urine Drug Screen:     Component Value Date/Time   LABOPIA NONE DETECTED 07/20/2018 1638   COCAINSCRNUR NONE DETECTED 07/20/2018 1638   LABBENZ NONE DETECTED 07/20/2018 1638   AMPHETMU NONE DETECTED 07/20/2018 1638   THCU POSITIVE (A) 07/20/2018 1638   LABBARB NONE DETECTED 07/20/2018 1638    Alcohol Level     Component Value Date/Time   ETH <10 08/03/2023 1410   INR  Lab Results  Component Value Date   INR 1.4 (H) 08/03/2023   APTT  Lab Results  Component Value Date   APTT 39 (H) 08/03/2023   AED levels: No results found for: "PHENYTOIN", "ZONISAMIDE", "LAMOTRIGINE", "LEVETIRACETA"  CT Head without contrast(Personally reviewed): Negative  ASSESSMENT   [name] is a 73 y.o. male  has a past medical history of Arthritis, Cameron ulcer, chronic (01/11/2018), Chronic back pain, GERD (gastroesophageal reflux disease), GIB (gastrointestinal bleeding) (2012), Glaucoma, Headache, Hepatitis C, Hiatal hernia with gastroesophageal reflux disease and esophagitis (01/05/2018), History of blood transfusion, History of gout, Hyperlipidemia, Insomnia, Ischemic colitis (HCC) (2012), Joint pain, Nocturia, Numbness, PONV (postoperative nausea and vomiting), Prostate cancer (HCC), Shortness of breath dyspnea, Stroke (HCC) (2016 ), Urinary frequency, and Urinary urgency.  My suspicion at this time  is that this is likely more of a generalized asthenia, he appears to be slightly dehydrated, and  this could be a potential etiology.  I do think an MRI would be prudent, but if this is negative I would not favor any further neurological workup at this time.  RECOMMENDATIONS  MRI brain If negative no further neurological workup. ______________________________________________________________________    Flint Hummer, MD [facility]

## 2023-08-03 NOTE — ED Provider Notes (Signed)
  Physical Exam  BP (!) 175/79   Pulse 71   Temp 98.8 F (37.1 C) (Oral)   Resp 18   Ht 5\' 9"  (1.753 m)   Wt 92.1 kg   SpO2 100%   BMI 29.98 kg/m   Physical Exam  Procedures  Procedures  ED Course / MDM    Medical Decision Making Amount and/or Complexity of Data Reviewed Labs: ordered. Radiology: ordered.  Risk Prescription drug management.   Patient with weakness.  Had some difficulty getting up.  Stroke had been called but workup reassuring.  MRI done and reassuring.  However urine shows likely infection.  Patient states will be admitted.  States he feels better.  States he lives at home alone but has caregivers.  Will give dose of Rocephin  here and culture sent.  Will discharge home with antibiotics.     Jonathon Arias, MD 08/03/23 2257

## 2023-08-03 NOTE — ED Triage Notes (Signed)
 Patient has hx of thalamic hemorrhage and family noticed patient unable to get self out of wheelchair and has increased slurred speech.  Patient has right side residual from previous stroke.

## 2023-08-06 LAB — URINE CULTURE: Culture: >=100000 — AB

## 2023-08-07 ENCOUNTER — Telehealth (HOSPITAL_BASED_OUTPATIENT_CLINIC_OR_DEPARTMENT_OTHER): Payer: Self-pay | Admitting: *Deleted

## 2023-08-07 NOTE — Progress Notes (Signed)
ED Antimicrobial Stewardship Positive Culture Follow Up   [name] is an 73 y.o. male who presented to [facility] on @ADMITDT @ with a chief complaint of  Chief Complaint  Patient presents with   Code Stroke    Recent Results (from the past 720 hours)  Urine Culture     Status: Abnormal   Collection Time: 08/03/23  9:16 PM   Specimen: Urine, Catheterized  Result Value Ref Range Status   Specimen Description URINE, CATHETERIZED  Final   Special Requests   Final    NONE Performed at [facility], [address], [place], [place] [address]    Culture (A)  Final    >=100,000 COLONIES/mL ESCHERICHIA COLI Confirmed Extended Spectrum Beta-Lactamase Producer (ESBL).  In bloodstream infections from ESBL organisms, carbapenems are preferred over piperacillin/tazobactam. They are shown to have a lower risk of mortality.    Report Status 08/06/2023 FINAL  Final   Organism ID, Bacteria ESCHERICHIA COLI (A)  Final      Susceptibility   Escherichia coli - MIC*    AMPICILLIN >=32 RESISTANT Resistant     CEFAZOLIN >=64 RESISTANT Resistant     CEFEPIME 4 INTERMEDIATE Intermediate     CEFTRIAXONE >=64 RESISTANT Resistant     CIPROFLOXACIN >=4 RESISTANT Resistant     GENTAMICIN >=16 RESISTANT Resistant     IMIPENEM <=0.25 SENSITIVE Sensitive     NITROFURANTOIN <=16 SENSITIVE Sensitive     TRIMETH/SULFA <=20 SENSITIVE Sensitive     AMPICILLIN/SULBACTAM 4 SENSITIVE Sensitive     PIP/TAZO <=4 SENSITIVE Sensitive ug/mL    * >=100,000 COLONIES/mL ESCHERICHIA COLI    [x]  Treated with keflex, organism resistant to prescribed antimicrobial  STOP Keflex START: Nitrofurantoin (Macrobid) 100mg  BID x 7 days (Qty 14; Refills 0)  ED Provider: Edyth Gunnels PA   Delmar Landau, PharmD, BCPS 08/07/2023 9:59 AM ED Clinical Pharmacist -  [phone]

## 2023-08-07 NOTE — Telephone Encounter (Signed)
Post ED Visit - Positive Culture Follow-up: Unsuccessful Patient Follow-up  Culture assessed and recommendations reviewed by:  [x]  Delmar Landau, Pharm.D. []  Celedonio Miyamoto, Pharm.D., BCPS AQ-ID []  Garvin Fila, Pharm.D., BCPS []  Georgina Pillion, [address].D., BCPS []  [place], [address].D., BCPS, AAHIVP []  Estella Husk, Pharm.D., BCPS, AAHIVP []  Sherlynn Carbon, PharmD []  Pollyann Samples, PharmD, BCPS  Positive urine culture  []  Patient discharged without antimicrobial prescription and treatment is now indicated [x]  Organism is resistant to prescribed ED discharge antimicrobial []  Patient with positive blood cultures   Unable to contact patient , letter will be sent to address on file  Plan: Stop Keflex Start Nitrofurantoin (Macrobid) 100mg  BID x 7 days (Qty 14) per Sabra Heck, PA  [name] 08/07/2023, 2:03 PM

## 2023-08-31 ENCOUNTER — Other Ambulatory Visit: Payer: Self-pay | Admitting: Registered Nurse

## 2023-08-31 DIAGNOSIS — F17210 Nicotine dependence, cigarettes, uncomplicated: Secondary | ICD-10-CM

## 2023-09-27 ENCOUNTER — Inpatient Hospital Stay: Admission: RE | Admit: 2023-09-27 | Payer: 59 | Source: Ambulatory Visit

## 2023-10-10 ENCOUNTER — Ambulatory Visit (INDEPENDENT_AMBULATORY_CARE_PROVIDER_SITE_OTHER): Payer: 59 | Admitting: Podiatry

## 2023-10-10 DIAGNOSIS — Z91199 Patient's noncompliance with other medical treatment and regimen due to unspecified reason: Secondary | ICD-10-CM

## 2023-10-10 NOTE — Progress Notes (Signed)
 No show

## 2023-10-16 ENCOUNTER — Emergency Department (HOSPITAL_COMMUNITY): Admission: EM | Admit: 2023-10-16 | Discharge: 2023-10-16 | Disposition: A | Discharge status: Home / Self Care

## 2023-10-16 ENCOUNTER — Other Ambulatory Visit: Payer: Self-pay

## 2023-10-16 ENCOUNTER — Encounter (HOSPITAL_COMMUNITY): Payer: Self-pay

## 2023-10-16 ENCOUNTER — Emergency Department (HOSPITAL_COMMUNITY)

## 2023-10-16 DIAGNOSIS — I159 Secondary hypertension, unspecified: Secondary | ICD-10-CM | POA: Insufficient documentation

## 2023-10-16 DIAGNOSIS — I69354 Hemiplegia and hemiparesis following cerebral infarction affecting left non-dominant side: Secondary | ICD-10-CM | POA: Diagnosis not present

## 2023-10-16 DIAGNOSIS — Z79899 Other long term (current) drug therapy: Secondary | ICD-10-CM | POA: Insufficient documentation

## 2023-10-16 DIAGNOSIS — F5101 Primary insomnia: Secondary | ICD-10-CM

## 2023-10-16 DIAGNOSIS — Z91148 Patient's other noncompliance with medication regimen for other reason: Secondary | ICD-10-CM | POA: Insufficient documentation

## 2023-10-16 DIAGNOSIS — D649 Anemia, unspecified: Secondary | ICD-10-CM | POA: Diagnosis not present

## 2023-10-16 DIAGNOSIS — R1032 Left lower quadrant pain: Secondary | ICD-10-CM | POA: Insufficient documentation

## 2023-10-16 DIAGNOSIS — E669 Obesity, unspecified: Secondary | ICD-10-CM | POA: Diagnosis not present

## 2023-10-16 DIAGNOSIS — I10 Essential (primary) hypertension: Secondary | ICD-10-CM

## 2023-10-16 DIAGNOSIS — Z6830 Body mass index (BMI) 30.0-30.9, adult: Secondary | ICD-10-CM | POA: Insufficient documentation

## 2023-10-16 DIAGNOSIS — R42 Dizziness and giddiness: Secondary | ICD-10-CM | POA: Diagnosis present

## 2023-10-16 DIAGNOSIS — R600 Localized edema: Secondary | ICD-10-CM

## 2023-10-16 LAB — CBC
HCT: 30.2 % — ABNORMAL LOW (ref 39.0–52.0)
Hemoglobin: 7.9 g/dL — ABNORMAL LOW (ref 13.0–17.0)
MCH: 18.2 pg — ABNORMAL LOW (ref 26.0–34.0)
MCHC: 26.2 g/dL — ABNORMAL LOW (ref 30.0–36.0)
MCV: 69.7 fL — ABNORMAL LOW (ref 80.0–100.0)
Platelets: 377 10*3/uL (ref 150–400)
RBC: 4.33 MIL/uL (ref 4.22–5.81)
RDW: 19.3 % — ABNORMAL HIGH (ref 11.5–15.5)
WBC: 4.3 10*3/uL (ref 4.0–10.5)
nRBC: 0 % (ref 0.0–0.2)

## 2023-10-16 LAB — COMPREHENSIVE METABOLIC PANEL WITH GFR
ALT: 10 U/L (ref 0–44)
AST: 14 U/L — ABNORMAL LOW (ref 15–41)
Albumin: 3.6 g/dL (ref 3.5–5.0)
Alkaline Phosphatase: 75 U/L (ref 38–126)
Anion gap: 8 (ref 5–15)
BUN: 13 mg/dL (ref 8–23)
CO2: 25 mmol/L (ref 22–32)
Calcium: 9.1 mg/dL (ref 8.9–10.3)
Chloride: 108 mmol/L (ref 98–111)
Creatinine, Ser: 0.96 mg/dL (ref 0.61–1.24)
GFR, Estimated: >60 mL/min (ref >60)
Glucose, Bld: 95 mg/dL (ref 70–99)
Potassium: 3.7 mmol/L (ref 3.5–5.1)
Sodium: 141 mmol/L (ref 135–145)
Total Bilirubin: 0.6 mg/dL (ref 0.0–1.2)
Total Protein: 6.8 g/dL (ref 6.5–8.1)

## 2023-10-16 LAB — URINALYSIS, ROUTINE W REFLEX MICROSCOPIC
Bilirubin Urine: NEGATIVE
Glucose, UA: NEGATIVE mg/dL
Hgb urine dipstick: NEGATIVE
Ketones, ur: NEGATIVE mg/dL
Leukocytes,Ua: NEGATIVE
Nitrite: NEGATIVE
Protein, ur: NEGATIVE mg/dL
Specific Gravity, Urine: 1.004 — ABNORMAL LOW (ref 1.005–1.030)
pH: 7 (ref 5.0–8.0)

## 2023-10-16 LAB — TROPONIN I (HIGH SENSITIVITY)
Troponin I (High Sensitivity): 13 ng/L (ref <18)
Troponin I (High Sensitivity): 13 ng/L (ref <18)

## 2023-10-16 LAB — I-STAT CG4 LACTIC ACID, ED
Lactic Acid, Venous: 1.6 mmol/L (ref 0.5–1.9)
Lactic Acid, Venous: 1.5 mmol/L (ref 0.5–1.9)

## 2023-10-16 LAB — LIPASE, BLOOD: Lipase: 25 U/L (ref 11–51)

## 2023-10-16 LAB — BRAIN NATRIURETIC PEPTIDE: B Natriuretic Peptide: 89.4 pg/mL (ref 0.0–100.0)

## 2023-10-16 MED ORDER — HYDRALAZINE HCL 25 MG PO TABS
50.0000 mg | ORAL_TABLET | Freq: Once | ORAL | Status: AC
  Administered 2023-10-16: 50 mg via ORAL
  Filled 2023-10-16: qty 2

## 2023-10-16 MED ORDER — HYDRALAZINE HCL 20 MG/ML IJ SOLN: 10.0000 mg | Freq: Once | INTRAMUSCULAR | Status: DC

## 2023-10-16 MED ORDER — FUROSEMIDE 20 MG PO TABS
20.0000 mg | ORAL_TABLET | Freq: Once | ORAL | Status: AC
  Administered 2023-10-16: 20 mg via ORAL
  Filled 2023-10-16: qty 1

## 2023-10-16 MED ORDER — HYDRALAZINE HCL 50 MG PO TABS: 50.0000 mg | ORAL_TABLET | Freq: Three times a day (TID) | ORAL | 0 refills | Status: DC

## 2023-10-16 MED ORDER — ONDANSETRON HCL 4 MG/2ML IJ SOLN
4.0000 mg | Freq: Once | INTRAMUSCULAR | Status: DC
  Filled 2023-10-16: qty 2

## 2023-10-16 MED ORDER — ONDANSETRON 4 MG PO TBDP
4.0000 mg | ORAL_TABLET | Freq: Once | ORAL | Status: AC
  Administered 2023-10-16: 4 mg via ORAL
  Filled 2023-10-16: qty 1

## 2023-10-16 MED ORDER — FUROSEMIDE 20 MG PO TABS: 20.0000 mg | ORAL_TABLET | Freq: Two times a day (BID) | ORAL | 3 refills | Status: AC

## 2023-10-16 MED ORDER — QUETIAPINE FUMARATE 200 MG PO TABS
200.0000 mg | ORAL_TABLET | Freq: Every day | ORAL | 0 refills | Status: AC
Start: 2023-10-16 — End: ?

## 2023-10-16 NOTE — ED Notes (Signed)
 Urinal provided to pt. Pt adv. He is unable to control his urine and uses a brief.

## 2023-10-16 NOTE — ED Notes (Signed)
 [facility] called

## 2023-10-16 NOTE — ED Notes (Signed)
 Notified Dr Adela Lank of pt's elevated bp and obtained orders

## 2023-10-16 NOTE — Discharge Instructions (Signed)
 Please take your blood pressure medications.  Follow-up with your primary doctor.  Return immediately felt fevers, chills, chest pain, shortness of breath, abdominal pain, inability to eat or drink due to nausea vomiting or severe headache, facial droop, unilateral weakness, or any new or worsening symptoms that are concerning to you.

## 2023-10-16 NOTE — ED Notes (Signed)
Pt off unit to XRay

## 2023-10-16 NOTE — ED Notes (Signed)
 Pt adv. He is bed bound and doesn't ambulate and has no other way home. Pt is high fall risk. [facility] contacted by Diplomatic Services operational officer.

## 2023-10-16 NOTE — ED Triage Notes (Signed)
 Pt BIB [facility] from home c/o LLQ abdominal pain that feels like cramping, nausea, HTN and RLE edema. Pt does have left sided deficits from previous stroke.

## 2023-10-16 NOTE — ED Provider Notes (Addendum)
 [place] EMERGENCY DEPARTMENT AT [facility] Provider Note   CSN: [id number] Arrival date & time: 10/16/23  1025     History  Chief Complaint  Patient presents with   Abdominal Pain   Hypertension    [name] is a 73 y.o. male.  This is a 73 year old male, somewhat a poor historian who presented today for generalized malaise and feeling lightheaded.  Reports symptoms gradually worsened over the course of this morning.  Noted to have elevated blood pressure.  He has been out of his blood pressure medications for the past week.  He notes that he did have some left-sided abdominal cramping associated with some nausea earlier this morning, but subsided and currently does not have any abdominal pain.  He is not nauseated.  Denies chest pain or shortness of breath.  Denies headache, vision changes.   Abdominal Pain Hypertension Associated symptoms include abdominal pain.       Home Medications Prior to Admission medications   Medication Sig Start Date End Date Taking? Authorizing Provider  acetaminophen  (TYLENOL ) 325 MG tablet Take 2 tablets (650 mg total) by mouth every 6 (six) hours as needed for moderate pain or headache. 12/27/20   Amin, Ankit C, MD  buPROPion  (WELLBUTRIN  SR) 200 MG 12 hr tablet TAKE 1 TABLET BY MOUTH DAILY 02/19/22   Viola Greulich, MD  carvedilol  (COREG ) 3.125 MG tablet Take 1 tablet (3.125 mg total) by mouth 2 (two) times daily with a meal. 05/15/23   Cala Castleman, MD  cephALEXin  (KEFLEX ) 500 MG capsule Take 1 capsule (500 mg total) by mouth 4 (four) times daily. 08/03/23   Mozell Arias, MD  cyanocobalamin  1000 MCG tablet Take 1 tablet (1,000 mcg total) by mouth daily. 05/16/23   Cala Castleman, MD  ferrous sulfate  325 (65 FE) MG tablet Take 1 tablet (325 mg total) by mouth daily with breakfast. 05/15/23   Singh, Prashant K, MD  folic acid  (FOLVITE ) 1 MG tablet Take 1 tablet (1 mg total) by mouth daily. 05/16/23   Cala Castleman, MD  furosemide  (LASIX ) 20 MG tablet Take 1 tablet (20 mg total) by mouth 2 (two) times daily. 10/16/23   Rolinda Climes, DO  hydrALAZINE  (APRESOLINE ) 50 MG tablet Take 1 tablet (50 mg total) by mouth 3 (three) times daily for 14 days. 10/16/23 10/30/23  Rolinda Climes, DO  ketoconazole (NIZORAL) 2 % cream Apply topically. 05/03/23   [provider]  nicotine  (NICODERM CQ  - DOSED IN MG/24 HOURS) 21 mg/24hr patch Place 1 patch (21 mg total) onto the skin daily. 05/16/23   Singh, Prashant K, MD  ondansetron  (ZOFRAN ) 4 MG tablet Take 4 mg by mouth every 8 (eight) hours as needed. 05/09/23   [provider]  pantoprazole  (PROTONIX ) 40 MG tablet TAKE 1 TABLET BY MOUTH TWICE A DAY BEFORE MEALS 03/03/22   Viola Greulich, MD  QUEtiapine  (SEROQUEL ) 200 MG tablet Take 1 tablet (200 mg total) by mouth at bedtime. 10/16/23   Rolinda Climes, DO  rosuvastatin (CRESTOR) 5 MG tablet SMARTSIG:1 Tablet(s) By Mouth Every Evening 04/20/23   [provider]  sertraline  (ZOLOFT ) 100 MG tablet Take 1 tablet (100 mg total) by mouth daily. 02/05/22   Rawland Caddy, MD  spironolactone  (ALDACTONE ) 25 MG tablet Take 1 tablet (25 mg total) by mouth daily. 12/03/21 05/11/23  Bridgette Campus, MD  Vitamin D, Ergocalciferol, 50000 units CAPS Take 1 capsule by mouth once a week.  04/20/23   [provider]      Allergies    Sudafed [pseudoephedrine]    Review of Systems   Review of Systems  Gastrointestinal:  Positive for abdominal pain.    Physical Exam Updated Vital Signs BP (!) 173/88   Pulse 66   Temp 98.4 F (36.9 C) (Oral)   Resp 18   Ht 5\' 9"  (1.753 m)   Wt 93.4 kg   SpO2 100%   BMI 30.42 kg/m  Physical Exam Vitals and nursing note reviewed.  Constitutional:      General: He is not in acute distress.    Appearance: He is obese. He is not toxic-appearing.  HENT:     Head: Normocephalic and atraumatic.  Cardiovascular:     Rate and Rhythm: Normal rate and regular  rhythm.  Pulmonary:     Effort: Pulmonary effort is normal.     Breath sounds: Normal breath sounds.  Abdominal:     General: Abdomen is flat.     Palpations: Abdomen is soft.     Tenderness: There is no abdominal tenderness.     Comments: Ventral scar from prior abd surgeries noted.   Skin:    General: Skin is warm and dry.     Capillary Refill: Capillary refill takes less than 2 seconds.  Neurological:     Mental Status: He is alert and oriented to person, place, and time.  Psychiatric:        Mood and Affect: Mood normal.        Behavior: Behavior normal.     ED Results / Procedures / Treatments   Labs (all labs ordered are listed, but only abnormal results are displayed) Labs Reviewed  CBC - Abnormal; Notable for the following components:      Result Value   Hemoglobin 7.9 (*)    HCT 30.2 (*)    MCV 69.7 (*)    MCH 18.2 (*)    MCHC 26.2 (*)    RDW 19.3 (*)    All other components within normal limits  COMPREHENSIVE METABOLIC PANEL WITH GFR - Abnormal; Notable for the following components:   AST 14 (*)    All other components within normal limits  URINALYSIS, ROUTINE W REFLEX MICROSCOPIC - Abnormal; Notable for the following components:   Color, Urine STRAW (*)    Specific Gravity, Urine 1.004 (*)    All other components within normal limits  LIPASE, BLOOD  BRAIN NATRIURETIC PEPTIDE  I-STAT CG4 LACTIC ACID, ED  I-STAT CG4 LACTIC ACID, ED  TROPONIN I (HIGH SENSITIVITY)  TROPONIN I (HIGH SENSITIVITY)    EKG EKG Interpretation Date/Time:  Sunday October 16 2023 11:01:13 EDT Ventricular Rate:  64 PR Interval:  217 QRS Duration:  94 QT Interval:  453 QTC Calculation: 468 R Axis:   4  Text Interpretation: Sinus rhythm Borderline prolonged PR interval Abnormal R-wave progression, early transition Left ventricular hypertrophy Confirmed by Elise Guile [phone]) on 10/16/2023 11:16:25 AM  Radiology DG Chest 2 View Result Date: 10/16/2023 CLINICAL DATA:  Near  syncope EXAM: CHEST - 2 VIEW COMPARISON:  05/10/2023 FINDINGS: Cardiomegaly with aortic tortuosity. There is no edema, consolidation, effusion, or pneumothorax. IMPRESSION: No acute finding. Chronic cardiomegaly. Electronically Signed   By: Ronnette Coke M.D.   On: 10/16/2023 11:34    Procedures Procedures    Medications Ordered in ED Medications  hydrALAZINE  (APRESOLINE ) tablet 50 mg (50 mg Oral Given 10/16/23 1125)  furosemide  (LASIX ) tablet 20 mg (20 mg Oral  Given 10/16/23 1140)  ondansetron  (ZOFRAN -ODT) disintegrating tablet 4 mg (4 mg Oral Given 10/16/23 1151)    ED Course/ Medical Decision Making/ A&P Clinical Course as of 10/16/23 1538  Sun Oct 16, 2023  1059 Normal EF on echo 11/10/21 [TY]  1116 EKG on my independent interpretation shows a rate of 64.  Prolonged PR, otherwise normal intervals.  No QTc prolongation.  EKG similar to prior.  No ST segment changes that would indicate ischemia. [TY]  1148 CBC(!) Anemia noted.  Appears has been baseline 8.5 hemoglobin [TY]  1149 WBC: 4.3 No suggestive of infectious process.  [TY]  1537 Reassuring.  Blood pressure improved with home medications.  Does not appear to be in acute decompensated heart failure.  BNP normal.  Chest x-ray without pulmonary edema.  Troponin normal.  No infectious etiology as he has no leukocytosis fever or tachycardia.  Lactate not elevated.  No significant metabolic derangements.  No transaminitis.  UA negative for UTI.  Tolerating p.o.  Emergency department without abdominal pain.  Will discharge with home blood pressure medications return precautions given. [TY]    Clinical Course User Index [TY] Rolinda Climes, DO                                 Medical Decision Making This is a 73 year old male presenting emergency department for lightheadedness and generalized malaise.  He is afebrile nontachycardic, hypertensive 195/112.  EMS reported blood pressures in the 200s systolic.  Triage nurse documented patient  with left lower quadrant abdominal pain; patient states he had that earlier today and has resolved.  He is not currently having abdominal pain and has a benign abdominal exam.  He has no new neurodeficits as he does have a history of prior stroke with left-sided deficits.  He is lightheaded, but appears to be normal sinus rhythm on the monitor with a rate in the upper 70s on my independent interpretation.  Will get screening labs, will give home medications.  Of note, he states that he has been out of his medications for the past week.  Amount and/or Complexity of Data Reviewed Independent Historian:     Details: EMS reports blood pressure in the 200s systolic External Data Reviewed:     Details: Complicated past medical history, prior stroke left-sided deficit, history of GI bleed, ischemic colitis, hepatitis, hypertension, hyperlipidemia.  Numerous abdominal surgeries   Labs: ordered. Decision-making details documented in ED Course. Radiology: ordered.    Details: Considered CT abdomen, however patient not currently having abdominal pain no nausea vomiting diarrhea.  His last bowel movement was this morning.  Low suspicion for acute intra-abdominal pathology/obstruction.  Will forego CT scan at this time. ECG/medicine tests: ordered and independent interpretation performed. Decision-making details documented in ED Course.  Risk Prescription drug management. Decision regarding hospitalization.     BNP ordered to evaluate for cardiac cause of his undifferentiated symptoms. It was negative making overt/new onset CHF unlike.y      Final Clinical Impression(s) / ED Diagnoses Final diagnoses:  Secondary hypertension    Rx / DC Orders ED Discharge Orders          Ordered    furosemide  (LASIX ) 20 MG tablet  2 times daily        10/16/23 1519    hydrALAZINE  (APRESOLINE ) 50 MG tablet  3 times daily        10/16/23 1519    QUEtiapine  (SEROQUEL )  200 MG tablet  Daily at bedtime        Note to Pharmacy: Limited refill given by this provider as pt to find a new pcp.   10/16/23 1519              Rolinda Climes, DO 10/16/23 1538    Rolinda Climes, DO 11/11/23 [phone]

## 2023-12-13 ENCOUNTER — Encounter (HOSPITAL_COMMUNITY): Payer: Self-pay

## 2023-12-13 ENCOUNTER — Observation Stay (HOSPITAL_COMMUNITY)

## 2023-12-13 ENCOUNTER — Other Ambulatory Visit: Payer: Self-pay

## 2023-12-13 ENCOUNTER — Inpatient Hospital Stay (HOSPITAL_COMMUNITY)
Admission: EM | Admit: 2023-12-13 | Discharge: 2023-12-15 | DRG: 689 | Disposition: A | Discharge status: Home / Self Care | Attending: Internal Medicine | Admitting: Internal Medicine

## 2023-12-13 DIAGNOSIS — G4700 Insomnia, unspecified: Secondary | ICD-10-CM | POA: Diagnosis present

## 2023-12-13 DIAGNOSIS — G9340 Encephalopathy, unspecified: Secondary | ICD-10-CM | POA: Diagnosis present

## 2023-12-13 DIAGNOSIS — Z888 Allergy status to other drugs, medicaments and biological substances status: Secondary | ICD-10-CM

## 2023-12-13 DIAGNOSIS — D649 Anemia, unspecified: Secondary | ICD-10-CM | POA: Diagnosis present

## 2023-12-13 DIAGNOSIS — G9341 Metabolic encephalopathy: Secondary | ICD-10-CM | POA: Diagnosis present

## 2023-12-13 DIAGNOSIS — F17210 Nicotine dependence, cigarettes, uncomplicated: Secondary | ICD-10-CM | POA: Diagnosis present

## 2023-12-13 DIAGNOSIS — T502X5A Adverse effect of carbonic-anhydrase inhibitors, benzothiadiazides and other diuretics, initial encounter: Secondary | ICD-10-CM | POA: Diagnosis present

## 2023-12-13 DIAGNOSIS — Z8546 Personal history of malignant neoplasm of prostate: Secondary | ICD-10-CM

## 2023-12-13 DIAGNOSIS — E785 Hyperlipidemia, unspecified: Secondary | ICD-10-CM | POA: Diagnosis present

## 2023-12-13 DIAGNOSIS — E876 Hypokalemia: Secondary | ICD-10-CM | POA: Diagnosis present

## 2023-12-13 DIAGNOSIS — Z79899 Other long term (current) drug therapy: Secondary | ICD-10-CM

## 2023-12-13 DIAGNOSIS — R531 Weakness: Secondary | ICD-10-CM | POA: Diagnosis not present

## 2023-12-13 DIAGNOSIS — Z8249 Family history of ischemic heart disease and other diseases of the circulatory system: Secondary | ICD-10-CM

## 2023-12-13 DIAGNOSIS — H409 Unspecified glaucoma: Secondary | ICD-10-CM | POA: Diagnosis present

## 2023-12-13 DIAGNOSIS — F419 Anxiety disorder, unspecified: Secondary | ICD-10-CM | POA: Diagnosis present

## 2023-12-13 DIAGNOSIS — E66811 Obesity, class 1: Secondary | ICD-10-CM | POA: Diagnosis present

## 2023-12-13 DIAGNOSIS — Z8673 Personal history of transient ischemic attack (TIA), and cerebral infarction without residual deficits: Secondary | ICD-10-CM

## 2023-12-13 DIAGNOSIS — N3000 Acute cystitis without hematuria: Secondary | ICD-10-CM | POA: Diagnosis not present

## 2023-12-13 DIAGNOSIS — I10 Essential (primary) hypertension: Secondary | ICD-10-CM | POA: Diagnosis present

## 2023-12-13 DIAGNOSIS — Z8619 Personal history of other infectious and parasitic diseases: Secondary | ICD-10-CM

## 2023-12-13 DIAGNOSIS — N390 Urinary tract infection, site not specified: Principal | ICD-10-CM | POA: Diagnosis present

## 2023-12-13 DIAGNOSIS — G8929 Other chronic pain: Secondary | ICD-10-CM | POA: Diagnosis present

## 2023-12-13 DIAGNOSIS — K219 Gastro-esophageal reflux disease without esophagitis: Secondary | ICD-10-CM | POA: Diagnosis present

## 2023-12-13 DIAGNOSIS — R451 Restlessness and agitation: Secondary | ICD-10-CM | POA: Diagnosis present

## 2023-12-13 DIAGNOSIS — M549 Dorsalgia, unspecified: Secondary | ICD-10-CM | POA: Diagnosis present

## 2023-12-13 DIAGNOSIS — I161 Hypertensive emergency: Secondary | ICD-10-CM | POA: Diagnosis present

## 2023-12-13 DIAGNOSIS — Z820 Family history of epilepsy and other diseases of the nervous system: Secondary | ICD-10-CM

## 2023-12-13 LAB — CBC WITH DIFFERENTIAL/PLATELET
Abs Immature Granulocytes: 0.02 10*3/uL (ref 0.00–0.07)
Basophils Absolute: 0.1 10*3/uL (ref 0.0–0.1)
Basophils Relative: 1 %
Eosinophils Absolute: 0.1 10*3/uL (ref 0.0–0.5)
Eosinophils Relative: 2 %
HCT: 32.1 % — ABNORMAL LOW (ref 39.0–52.0)
Hemoglobin: 8.3 g/dL — ABNORMAL LOW (ref 13.0–17.0)
Immature Granulocytes: 0 %
Lymphocytes Relative: 28 %
Lymphs Abs: 1.5 10*3/uL (ref 0.7–4.0)
MCH: 17.4 pg — ABNORMAL LOW (ref 26.0–34.0)
MCHC: 25.9 g/dL — ABNORMAL LOW (ref 30.0–36.0)
MCV: 67.4 fL — ABNORMAL LOW (ref 80.0–100.0)
Monocytes Absolute: 0.5 10*3/uL (ref 0.1–1.0)
Monocytes Relative: 9 %
Neutro Abs: 3.1 10*3/uL (ref 1.7–7.7)
Neutrophils Relative %: 60 %
Platelets: 392 10*3/uL (ref 150–400)
RBC: 4.76 MIL/uL (ref 4.22–5.81)
RDW: 18.1 % — ABNORMAL HIGH (ref 11.5–15.5)
WBC: 5.2 10*3/uL (ref 4.0–10.5)
nRBC: 0 % (ref 0.0–0.2)

## 2023-12-13 LAB — COMPREHENSIVE METABOLIC PANEL WITH GFR
ALT: 9 U/L (ref 0–44)
AST: 16 U/L (ref 15–41)
Albumin: 4.1 g/dL (ref 3.5–5.0)
Alkaline Phosphatase: 102 U/L (ref 38–126)
Anion gap: 12 (ref 5–15)
BUN: 14 mg/dL (ref 8–23)
CO2: 24 mmol/L (ref 22–32)
Calcium: 9.2 mg/dL (ref 8.9–10.3)
Chloride: 103 mmol/L (ref 98–111)
Creatinine, Ser: 0.85 mg/dL (ref 0.61–1.24)
GFR, Estimated: >60 mL/min (ref >60)
Glucose, Bld: 120 mg/dL — ABNORMAL HIGH (ref 70–99)
Potassium: 3.1 mmol/L — ABNORMAL LOW (ref 3.5–5.1)
Sodium: 139 mmol/L (ref 135–145)
Total Bilirubin: 0.3 mg/dL (ref 0.0–1.2)
Total Protein: 7.8 g/dL (ref 6.5–8.1)

## 2023-12-13 LAB — RAPID URINE DRUG SCREEN, HOSP PERFORMED
Amphetamines: NOT DETECTED
Barbiturates: NOT DETECTED
Benzodiazepines: NOT DETECTED
Cocaine: NOT DETECTED
Opiates: NOT DETECTED
Tetrahydrocannabinol: NOT DETECTED

## 2023-12-13 LAB — URINALYSIS, W/ REFLEX TO CULTURE (INFECTION SUSPECTED)
Bilirubin Urine: NEGATIVE
Glucose, UA: NEGATIVE mg/dL
Hgb urine dipstick: NEGATIVE
Ketones, ur: NEGATIVE mg/dL
Nitrite: POSITIVE — AB
Protein, ur: 30 mg/dL — AB
Specific Gravity, Urine: 1.014 (ref 1.005–1.030)
WBC, UA: >50 WBC/hpf (ref 0–5)
pH: 8 (ref 5.0–8.0)

## 2023-12-13 LAB — FOLATE: Folate: 6 ng/mL (ref >5.9)

## 2023-12-13 LAB — MRSA NEXT GEN BY PCR, NASAL: MRSA by PCR Next Gen: NOT DETECTED

## 2023-12-13 LAB — VITAMIN B12: Vitamin B-12: 392 pg/mL (ref 180–914)

## 2023-12-13 LAB — ETHANOL: Alcohol, Ethyl (B): <15 mg/dL (ref <15)

## 2023-12-13 LAB — AMMONIA: Ammonia: 21 umol/L (ref 9–35)

## 2023-12-13 MED ORDER — CHLORHEXIDINE GLUCONATE CLOTH 2 % EX PADS
6.0000 | MEDICATED_PAD | Freq: Every day | CUTANEOUS | Status: DC
  Administered 2023-12-13 – 2023-12-15 (×3): 6 via TOPICAL

## 2023-12-13 MED ORDER — ORAL CARE MOUTH RINSE: 15.0000 mL | OROMUCOSAL | Status: DC | PRN

## 2023-12-13 MED ORDER — LORAZEPAM 0.5 MG PO TABS
0.5000 mg | ORAL_TABLET | Freq: Once | ORAL | Status: DC
  Filled 2023-12-13: qty 1

## 2023-12-13 MED ORDER — POTASSIUM CHLORIDE 10 MEQ/100ML IV SOLN
10.0000 meq | Freq: Once | INTRAVENOUS | Status: AC
  Administered 2023-12-13: 10 meq via INTRAVENOUS
  Filled 2023-12-13: qty 100

## 2023-12-13 MED ORDER — LORAZEPAM 2 MG/ML IJ SOLN
1.0000 mg | Freq: Once | INTRAMUSCULAR | Status: AC
  Administered 2023-12-13: 1 mg via INTRAVENOUS
  Filled 2023-12-13: qty 1

## 2023-12-13 MED ORDER — SODIUM CHLORIDE 0.9 % IV SOLN
1.0000 g | INTRAVENOUS | Status: DC
  Administered 2023-12-13: 1 g via INTRAVENOUS
  Filled 2023-12-13: qty 10

## 2023-12-13 MED ORDER — CARVEDILOL 3.125 MG PO TABS
3.1250 mg | ORAL_TABLET | Freq: Once | ORAL | Status: AC
  Administered 2023-12-13: 3.125 mg via ORAL
  Filled 2023-12-13: qty 1

## 2023-12-13 MED ORDER — LABETALOL HCL 5 MG/ML IV SOLN
10.0000 mg | INTRAVENOUS | Status: DC | PRN
  Administered 2023-12-13: 10 mg via INTRAVENOUS
  Filled 2023-12-13: qty 4

## 2023-12-13 MED ORDER — POTASSIUM CHLORIDE CRYS ER 20 MEQ PO TBCR: 40.0000 meq | EXTENDED_RELEASE_TABLET | Freq: Once | ORAL | Status: DC

## 2023-12-13 MED ORDER — SODIUM CHLORIDE 0.9 % IV SOLN: INTRAVENOUS | Status: DC

## 2023-12-13 MED ORDER — ALBUTEROL SULFATE (2.5 MG/3ML) 0.083% IN NEBU: 2.5000 mg | INHALATION_SOLUTION | RESPIRATORY_TRACT | Status: DC | PRN

## 2023-12-13 MED ORDER — ONDANSETRON HCL 4 MG/2ML IJ SOLN
4.0000 mg | Freq: Once | INTRAMUSCULAR | Status: AC
  Administered 2023-12-13: 4 mg via INTRAVENOUS
  Filled 2023-12-13: qty 2

## 2023-12-13 MED ORDER — ACETAMINOPHEN 650 MG RE SUPP: 650.0000 mg | Freq: Four times a day (QID) | RECTAL | Status: DC | PRN

## 2023-12-13 MED ORDER — SODIUM CHLORIDE 0.9 % IV SOLN: 1.0000 g | INTRAVENOUS | Status: DC

## 2023-12-13 MED ORDER — ENOXAPARIN SODIUM 40 MG/0.4ML IJ SOSY
40.0000 mg | PREFILLED_SYRINGE | INTRAMUSCULAR | Status: DC
  Administered 2023-12-13 – 2023-12-14 (×2): 40 mg via SUBCUTANEOUS
  Filled 2023-12-13 (×2): qty 0.4

## 2023-12-13 MED ORDER — POTASSIUM CHLORIDE 10 MEQ/100ML IV SOLN
10.0000 meq | INTRAVENOUS | Status: AC
  Administered 2023-12-13 (×3): 10 meq via INTRAVENOUS
  Filled 2023-12-13 (×3): qty 100

## 2023-12-13 MED ORDER — POTASSIUM CHLORIDE CRYS ER 20 MEQ PO TBCR
40.0000 meq | EXTENDED_RELEASE_TABLET | Freq: Once | ORAL | Status: DC
  Filled 2023-12-13: qty 2

## 2023-12-13 MED ORDER — ACETAMINOPHEN 325 MG PO TABS: 650.0000 mg | ORAL_TABLET | Freq: Four times a day (QID) | ORAL | Status: DC | PRN

## 2023-12-13 MED ORDER — ONDANSETRON HCL 4 MG PO TABS: 4.0000 mg | ORAL_TABLET | Freq: Four times a day (QID) | ORAL | Status: DC | PRN

## 2023-12-13 MED ORDER — ONDANSETRON HCL 4 MG/2ML IJ SOLN: 4.0000 mg | Freq: Four times a day (QID) | INTRAMUSCULAR | Status: DC | PRN

## 2023-12-13 MED ORDER — HYDRALAZINE HCL 20 MG/ML IJ SOLN
10.0000 mg | INTRAMUSCULAR | Status: DC | PRN
  Administered 2023-12-13 – 2023-12-15 (×4): 10 mg via INTRAVENOUS
  Filled 2023-12-13 (×6): qty 1

## 2023-12-13 MED ORDER — SODIUM CHLORIDE 0.9 % IV BOLUS
1000.0000 mL | Freq: Once | INTRAVENOUS | Status: AC
  Administered 2023-12-13: 1000 mL via INTRAVENOUS

## 2023-12-13 MED ORDER — SODIUM CHLORIDE 0.9 % IV SOLN
1.0000 g | Freq: Three times a day (TID) | INTRAVENOUS | Status: DC
  Administered 2023-12-13 – 2023-12-15 (×6): 1 g via INTRAVENOUS
  Filled 2023-12-13 (×7): qty 20

## 2023-12-13 NOTE — ED Notes (Signed)
 Tried to give K but pt would not wake up long enough to take it vitals are O2 98% HR 62. Pt chest is rising and falling.

## 2023-12-13 NOTE — ED Provider Notes (Signed)
 [place] EMERGENCY DEPARTMENT AT [facility] Provider Note   CSN: [id number] Arrival date & time: 12/13/23  1050     History  Chief Complaint  Patient presents with   Weakness    [name] is a 73 y.o. male.  73 year old male presents with weakness times several days.  States has had insomnia and takes Seroquel  but this is not been helping.  He denies any SI or HI.  Denies any fever, cough congestion.  No nausea vomiting diarrhea.  No recent medication changes.  States that he had this before in the past.  States he might feel anxious.  Notes that the symptoms use resolve on their own.  Denies any new stressors.     Home Medications Prior to Admission medications   Medication Sig Start Date End Date Taking? Authorizing Provider  acetaminophen  (TYLENOL ) 325 MG tablet Take 2 tablets (650 mg total) by mouth every 6 (six) hours as needed for moderate pain or headache. 12/27/20   Amin, Ankit C, MD  buPROPion  (WELLBUTRIN  SR) 200 MG 12 hr tablet TAKE 1 TABLET BY MOUTH DAILY 02/19/22   Viola Greulich, MD  carvedilol  (COREG ) 3.125 MG tablet Take 1 tablet (3.125 mg total) by mouth 2 (two) times daily with a meal. 05/15/23   Cala Castleman, MD  cephALEXin  (KEFLEX ) 500 MG capsule Take 1 capsule (500 mg total) by mouth 4 (four) times daily. 08/03/23   Mozell Arias, MD  cyanocobalamin  1000 MCG tablet Take 1 tablet (1,000 mcg total) by mouth daily. 05/16/23   Cala Castleman, MD  ferrous sulfate  325 (65 FE) MG tablet Take 1 tablet (325 mg total) by mouth daily with breakfast. 05/15/23   Singh, Prashant K, MD  folic acid  (FOLVITE ) 1 MG tablet Take 1 tablet (1 mg total) by mouth daily. 05/16/23   Singh, Prashant K, MD  furosemide  (LASIX ) 20 MG tablet Take 1 tablet (20 mg total) by mouth 2 (two) times daily. 10/16/23   Rolinda Climes, DO  hydrALAZINE  (APRESOLINE ) 50 MG tablet Take 1 tablet (50 mg total) by mouth 3 (three) times daily for 14 days. 10/16/23 10/30/23  Rolinda Climes, DO  ketoconazole (NIZORAL) 2 % cream Apply topically. 05/03/23   [provider]  nicotine  (NICODERM CQ  - DOSED IN MG/24 HOURS) 21 mg/24hr patch Place 1 patch (21 mg total) onto the skin daily. 05/16/23   Singh, Prashant K, MD  ondansetron  (ZOFRAN ) 4 MG tablet Take 4 mg by mouth every 8 (eight) hours as needed. 05/09/23   [provider]  pantoprazole  (PROTONIX ) 40 MG tablet TAKE 1 TABLET BY MOUTH TWICE A DAY BEFORE MEALS 03/03/22   Viola Greulich, MD  QUEtiapine  (SEROQUEL ) 200 MG tablet Take 1 tablet (200 mg total) by mouth at bedtime. 10/16/23   Rolinda Climes, DO  rosuvastatin (CRESTOR) 5 MG tablet SMARTSIG:1 Tablet(s) By Mouth Every Evening 04/20/23   [provider]  sertraline  (ZOLOFT ) 100 MG tablet Take 1 tablet (100 mg total) by mouth daily. 02/05/22   Rawland Caddy, MD  spironolactone  (ALDACTONE ) 25 MG tablet Take 1 tablet (25 mg total) by mouth daily. 12/03/21 05/11/23  Bridgette Campus, MD  Vitamin D, Ergocalciferol, 50000 units CAPS Take 1 capsule by mouth once a week. 04/20/23   [provider]      Allergies    Sudafed [pseudoephedrine]    Review of Systems   Review of Systems  All other systems reviewed and are negative.  Physical Exam Updated Vital Signs BP (!) 188/76 (BP Location: Left Arm)   Pulse 98   Temp 98 F (36.7 C) (Oral)   Resp 18   SpO2 98%  Physical Exam Vitals and nursing note reviewed.  Constitutional:      General: He is not in acute distress.    Appearance: Normal appearance. He is well-developed. He is not toxic-appearing.  HENT:     Head: Normocephalic and atraumatic.  Eyes:     General: Lids are normal.     Conjunctiva/sclera: Conjunctivae normal.     Pupils: Pupils are equal, round, and reactive to light.  Neck:     Thyroid : No thyroid  mass.     Trachea: No tracheal deviation.  Cardiovascular:     Rate and Rhythm: Normal rate and regular rhythm.     Heart sounds: Normal heart sounds. No murmur  heard.    No gallop.  Pulmonary:     Effort: Pulmonary effort is normal. No respiratory distress.     Breath sounds: Normal breath sounds. No stridor. No decreased breath sounds, wheezing, rhonchi or rales.  Abdominal:     General: There is no distension.     Palpations: Abdomen is soft.     Tenderness: There is no abdominal tenderness. There is no rebound.  Musculoskeletal:        General: No tenderness. Normal range of motion.     Cervical back: Normal range of motion and neck supple.  Skin:    General: Skin is warm and dry.     Findings: No abrasion or rash.  Neurological:     General: No focal deficit present.     Mental Status: He is alert and oriented to person, place, and time. Mental status is at baseline.     GCS: GCS eye subscore is 4. GCS verbal subscore is 5. GCS motor subscore is 6.     Cranial Nerves: No cranial nerve deficit.     Sensory: No sensory deficit.     Motor: Motor function is intact.  Psychiatric:        Attention and Perception: Attention normal.        Mood and Affect: Affect is flat.        Speech: Speech normal.        Behavior: Behavior normal.   ED Results / Procedures / Treatments   Labs (all labs ordered are listed, but only abnormal results are displayed) Labs Reviewed  URINALYSIS, W/ REFLEX TO CULTURE (INFECTION SUSPECTED)  CBC WITH DIFFERENTIAL/PLATELET  COMPREHENSIVE METABOLIC PANEL WITH GFR    EKG EKG Interpretation Date/Time:  Tuesday Dec 13 2023 12:02:16 EDT Ventricular Rate:  71 PR Interval:  190 QRS Duration:  96 QT Interval:  442 QTC Calculation: 480 R Axis:   -1  Text Interpretation: Normal sinus rhythm Left ventricular hypertrophy with repolarization abnormality ( R in aVL ) Prolonged QT Abnormal ECG When compared with ECG of 16-Oct-2023 11:01, PREVIOUS ECG IS PRESENT No significant change since last tracing Confirmed by Lind Repine ([id number]) on 12/13/2023 12:55:52 PM  Radiology No results found.  Procedures Procedures     Medications Ordered in ED Medications  LORazepam  (ATIVAN ) tablet 0.5 mg (has no administration in time range)    ED Course/ Medical Decision Making/ A&P                                 Medical Decision Making Amount and/or  Complexity of Data Reviewed Labs: ordered. ECG/medicine tests: ordered.  Risk Prescription drug management.  Patient's EKG shows normal sinus rhythm at a rate of 70.  No ischemic changes noted. Mild hypokalemia noted potassium 3.1.  Will give IV piggyback here.  Patient stated he felt more anxious.  Did give Ativan  0.5 mg.  He also had emesis here and was given Zofran . Patient's urinalysis shows infection here.  Patient started on IV Rocephin .  Due to patient's emesis and weakness he will require admission for UTI.  Will consult hospitalist team        Final Clinical Impression(s) / ED Diagnoses Final diagnoses:  None    Rx / DC Orders ED Discharge Orders     None         Lind Repine, MD 12/13/23 1419

## 2023-12-13 NOTE — Plan of Care (Signed)
  Problem: Health Behavior/Discharge Planning: Goal: Ability to manage health-related needs will improve Outcome: Not Progressing   Problem: Clinical Measurements: Goal: Ability to maintain clinical measurements within normal limits will improve Outcome: Not Progressing Goal: Diagnostic test results will improve Outcome: Not Progressing   

## 2023-12-13 NOTE — ED Triage Notes (Signed)
 [facility] reports pt coming from home c/o generalized weakness that began this morning, stroke screen negative with EMS. Pt c/o right leg pain but states that is not new.

## 2023-12-13 NOTE — H&P (Signed)
 History and Physical  [name] RUE:[id number] DOB: 1951/06/01 DOA: 12/13/2023  PCP: Hershell Lose, NP   Chief Complaint: Weakness, anxiety  HPI: [name] is a 73 y.o. male with medical history significant for GERD, hypertension, prior small bowel obstruction, CVA being admitted to the hospital with weakness in the setting of UTI.  Patient came to the hospital via EMS with reports of generalized weakness that began this morning at home.  Apparently also told the ER provider that he has had insomnia despite Seroquel  and has been feeling anxious.  I was unable to get any further history from the patient, as he is very somnolent after receiving IV Ativan , and difficult to keep awake.  Review of Systems: Please see HPI for pertinent positives and negatives. A complete 10 system review of systems could not be performed as the patient is quite somnolent and difficult to arouse.  Past Medical History:  Diagnosis Date   Arthritis    Donelda Fujita ulcer, chronic 01/11/2018   Chronic back pain    GERD (gastroesophageal reflux disease)    uses Baking Soda   GIB (gastrointestinal bleeding) 2012   Glaucoma    right eye   Headache    occasionally   Hepatitis C    Hiatal hernia with gastroesophageal reflux disease and esophagitis 01/05/2018   History of blood transfusion    no abnormal reaction noted   History of gout    doesn't take any meds   Hyperlipidemia    not on any meds   Insomnia    takes Ambien  nightly   Ischemic colitis (HCC) 2012   Joint pain    Nocturia    Numbness    both legs occasionally   PONV (postoperative nausea and vomiting)    Prostate cancer (HCC)    Shortness of breath dyspnea    rarely but when notices he can be lying/sitting/exertion.Dr.Hochrein is aware per pt   Stroke [facility]) 2016    Urinary frequency    Urinary urgency    Past Surgical History:  Procedure Laterality Date   APPENDECTOMY     BIOPSY  01/11/2018   Procedure: BIOPSY;  Surgeon: Kenney Peacemaker, MD;  Location: [facility] ENDOSCOPY;  Service: Endoscopy;;   COLONOSCOPY     COLONOSCOPY N/A 10/26/2015   Procedure: COLONOSCOPY;  Surgeon: Albertina Hugger, MD;  Location: [facility] ENDOSCOPY;  Service: Endoscopy;  Laterality: N/A;   ESOPHAGOGASTRODUODENOSCOPY     ESOPHAGOGASTRODUODENOSCOPY N/A 10/26/2015   Procedure: ESOPHAGOGASTRODUODENOSCOPY (EGD);  Surgeon: Albertina Hugger, MD;  Location: [facility] ENDOSCOPY;  Service: Endoscopy;  Laterality: N/A;   ESOPHAGOGASTRODUODENOSCOPY (EGD) WITH PROPOFOL  N/A 01/11/2018   Procedure: ESOPHAGOGASTRODUODENOSCOPY (EGD) WITH PROPOFOL ;  Surgeon: Kenney Peacemaker, MD;  Location: [facility] ENDOSCOPY;  Service: Endoscopy;  Laterality: N/A;   ESOPHAGOGASTRODUODENOSCOPY (EGD) WITH PROPOFOL  N/A 12/26/2020   Procedure: ESOPHAGOGASTRODUODENOSCOPY (EGD) WITH PROPOFOL ;  Surgeon: Asencion Blacksmith, MD;  Location: [facility] ENDOSCOPY;  Service: Endoscopy;  Laterality: N/A;   HOT HEMOSTASIS N/A 12/26/2020   Procedure: HOT HEMOSTASIS (ARGON PLASMA COAGULATION/BICAP);  Surgeon: Asencion Blacksmith, MD;  Location: [facility] ENDOSCOPY;  Service: Endoscopy;  Laterality: N/A;   INGUINAL HERNIA REPAIR Right 11/04/2014   Procedure: RIGHT INGUINAL HERNIA REPAIR WITH MESH;  Surgeon: Adalberto Hollow, MD;  Location: [facility] OR;  Service: General;  Laterality: Right;   MASS EXCISION N/A 11/04/2014   Procedure: REMOVAL OF RIGHT GROIN SOFT TISSUE MASS;  Surgeon: Adalberto Hollow, MD;  Location: [facility] OR;  Service: General;  Laterality: N/A;  Multiple abdominal surgeries     total of 13   PROSTATECTOMY     SMALL INTESTINE SURGERY     Social History:  reports that he has been smoking cigarettes. He has a 25 pack-year smoking history. He has never used smokeless tobacco. He reports current alcohol use. He reports that he does not currently use drugs after having used the following drugs: Marijuana. Frequency: 5.00 times per week.  Allergies  Allergen Reactions   Sudafed [Pseudoephedrine] Palpitations and Other (See Comments)    Heart  "races"    Family History  Problem Relation Age of Onset   Alzheimer's disease Father    Cancer Mother        Lymph node   Heart failure Brother 83       Transplant 13 years ago   CAD Brother    Heart disease Sister 66       MI     Prior to Admission medications   Medication Sig Start Date End Date Taking? Authorizing Provider  acetaminophen  (TYLENOL ) 325 MG tablet Take 2 tablets (650 mg total) by mouth every 6 (six) hours as needed for moderate pain or headache. 12/27/20   Amin, Ankit C, MD  buPROPion  (WELLBUTRIN  SR) 200 MG 12 hr tablet TAKE 1 TABLET BY MOUTH DAILY 02/19/22   Viola Greulich, MD  carvedilol  (COREG ) 3.125 MG tablet Take 1 tablet (3.125 mg total) by mouth 2 (two) times daily with a meal. 05/15/23   Cala Castleman, MD  cephALEXin  (KEFLEX ) 500 MG capsule Take 1 capsule (500 mg total) by mouth 4 (four) times daily. 08/03/23   Mozell Arias, MD  cyanocobalamin  1000 MCG tablet Take 1 tablet (1,000 mcg total) by mouth daily. 05/16/23   Singh, Prashant K, MD  ferrous sulfate  325 (65 FE) MG tablet Take 1 tablet (325 mg total) by mouth daily with breakfast. 05/15/23   Singh, Prashant K, MD  folic acid  (FOLVITE ) 1 MG tablet Take 1 tablet (1 mg total) by mouth daily. 05/16/23   Singh, Prashant K, MD  furosemide  (LASIX ) 20 MG tablet Take 1 tablet (20 mg total) by mouth 2 (two) times daily. 10/16/23   Rolinda Climes, DO  hydrALAZINE  (APRESOLINE ) 50 MG tablet Take 1 tablet (50 mg total) by mouth 3 (three) times daily for 14 days. 10/16/23 10/30/23  Rolinda Climes, DO  ketoconazole (NIZORAL) 2 % cream Apply topically. 05/03/23   [provider]  nicotine  (NICODERM CQ  - DOSED IN MG/24 HOURS) 21 mg/24hr patch Place 1 patch (21 mg total) onto the skin daily. 05/16/23   Singh, Prashant K, MD  ondansetron  (ZOFRAN ) 4 MG tablet Take 4 mg by mouth every 8 (eight) hours as needed. 05/09/23   [provider]  pantoprazole  (PROTONIX ) 40 MG tablet TAKE 1 TABLET BY MOUTH TWICE A  DAY BEFORE MEALS 03/03/22   Viola Greulich, MD  QUEtiapine  (SEROQUEL ) 200 MG tablet Take 1 tablet (200 mg total) by mouth at bedtime. 10/16/23   Rolinda Climes, DO  rosuvastatin (CRESTOR) 5 MG tablet SMARTSIG:1 Tablet(s) By Mouth Every Evening 04/20/23   [provider]  sertraline  (ZOLOFT ) 100 MG tablet Take 1 tablet (100 mg total) by mouth daily. 02/05/22   Rawland Caddy, MD  spironolactone  (ALDACTONE ) 25 MG tablet Take 1 tablet (25 mg total) by mouth daily. 12/03/21 05/11/23  Bridgette Campus, MD  Vitamin D, Ergocalciferol, 50000 units CAPS Take 1 capsule by mouth once a week. 04/20/23   [provider]    Physical Exam: BP (!) 167/82 (BP Location: Right Arm)   Pulse 71   Temp 98 F (36.7 C) (Oral)   Resp 20   SpO2 97%  General: Somnolent, very difficult to wake up.  Vital signs are normal, breathing comfortably on room air. Eyes: EOMI, clear conjuctivae, white sclerea Cardiovascular: RRR, no murmurs or rubs, no peripheral edema  Respiratory: clear to auscultation bilaterally on anterior exam, no wheezes, no crackles  Abdomen: soft, nontender, nondistended Skin: dry, no rashes  Musculoskeletal: no joint effusions, normal range of motion           Labs on Admission:  Basic Metabolic Panel: Recent Labs  Lab 12/13/23 1135  NA 139  K 3.1*  CL 103  CO2 24  GLUCOSE 120*  BUN 14  CREATININE 0.85  CALCIUM  9.2   Liver Function Tests: Recent Labs  Lab 12/13/23 1135  AST 16  ALT 9  ALKPHOS 102  BILITOT 0.3  PROT 7.8  ALBUMIN 4.1   No results for input(s): "LIPASE", "AMYLASE" in the last 168 hours. No results for input(s): "AMMONIA" in the last 168 hours. CBC: Recent Labs  Lab 12/13/23 1135  WBC 5.2  NEUTROABS 3.1  HGB 8.3*  HCT 32.1*  MCV 67.4*  PLT 392   Cardiac Enzymes: No results for input(s): "CKTOTAL", "CKMB", "CKMBINDEX", "TROPONINI" in the last 168 hours. BNP (last 3 results) Recent Labs    05/14/23 0524 05/15/23 0242 10/16/23 1421   BNP 94.5 67.3 89.4    ProBNP (last 3 results) No results for input(s): "PROBNP" in the last 8760 hours.  CBG: No results for input(s): "GLUCAP" in the last 168 hours.  Radiological Exams on Admission: No results found. Assessment/Plan [name] is a 73 y.o. male with medical history significant for GERD, hypertension, prior small bowel obstruction, CVA being admitted to the hospital with weakness in the setting of UTI.  UTI-with generalized weakness, abnormal urinalysis.  No evidence of sepsis.  He does have a history of ESBL E. coli UTI January 2025. -Observation admission -Empiric IV meropenem -Follow-up urine culture  Altered mental status-nursing staff states that patient was globally weak but awake alert and oriented on presentation, since receiving IV Ativan  in the ER he has been incredibly somnolent. -Stat noncontrast head CT, given history of stroke  Hypokalemia-suspect this is due to Lasix  use, seems that he is not on any potassium supplementation -Replete potassium -Recheck with morning labs  Hypertension-will resume home medications once reconciled  Chronic anemia-appears to be near baseline  Anxiety-Seroquel  at bedtime  DVT prophylaxis: Lovenox      Code Status: Full Code  Consults called: None  Admission status: Observation  Time spent: 48 minutes  [name] Rickey Charm MD [facility] Pager [phone]  If 7PM-7AM, please contact night-coverage [email or web address] Password [facility]  12/13/2023, 2:41 PM

## 2023-12-14 DIAGNOSIS — R451 Restlessness and agitation: Secondary | ICD-10-CM | POA: Diagnosis not present

## 2023-12-14 DIAGNOSIS — G9340 Encephalopathy, unspecified: Secondary | ICD-10-CM

## 2023-12-14 DIAGNOSIS — R531 Weakness: Secondary | ICD-10-CM | POA: Diagnosis present

## 2023-12-14 DIAGNOSIS — Z820 Family history of epilepsy and other diseases of the nervous system: Secondary | ICD-10-CM | POA: Diagnosis not present

## 2023-12-14 DIAGNOSIS — H409 Unspecified glaucoma: Secondary | ICD-10-CM | POA: Diagnosis present

## 2023-12-14 DIAGNOSIS — E66811 Obesity, class 1: Secondary | ICD-10-CM | POA: Diagnosis present

## 2023-12-14 DIAGNOSIS — F419 Anxiety disorder, unspecified: Secondary | ICD-10-CM | POA: Diagnosis present

## 2023-12-14 DIAGNOSIS — N390 Urinary tract infection, site not specified: Secondary | ICD-10-CM | POA: Diagnosis present

## 2023-12-14 DIAGNOSIS — G9341 Metabolic encephalopathy: Secondary | ICD-10-CM | POA: Diagnosis present

## 2023-12-14 DIAGNOSIS — D649 Anemia, unspecified: Secondary | ICD-10-CM | POA: Diagnosis present

## 2023-12-14 DIAGNOSIS — N3000 Acute cystitis without hematuria: Secondary | ICD-10-CM | POA: Diagnosis not present

## 2023-12-14 DIAGNOSIS — Z8673 Personal history of transient ischemic attack (TIA), and cerebral infarction without residual deficits: Secondary | ICD-10-CM | POA: Diagnosis not present

## 2023-12-14 DIAGNOSIS — M549 Dorsalgia, unspecified: Secondary | ICD-10-CM | POA: Diagnosis present

## 2023-12-14 DIAGNOSIS — E876 Hypokalemia: Secondary | ICD-10-CM | POA: Diagnosis present

## 2023-12-14 DIAGNOSIS — E785 Hyperlipidemia, unspecified: Secondary | ICD-10-CM | POA: Diagnosis present

## 2023-12-14 DIAGNOSIS — I10 Essential (primary) hypertension: Secondary | ICD-10-CM | POA: Diagnosis present

## 2023-12-14 DIAGNOSIS — Z888 Allergy status to other drugs, medicaments and biological substances status: Secondary | ICD-10-CM | POA: Diagnosis not present

## 2023-12-14 DIAGNOSIS — T502X5A Adverse effect of carbonic-anhydrase inhibitors, benzothiadiazides and other diuretics, initial encounter: Secondary | ICD-10-CM | POA: Diagnosis present

## 2023-12-14 DIAGNOSIS — F17210 Nicotine dependence, cigarettes, uncomplicated: Secondary | ICD-10-CM | POA: Diagnosis present

## 2023-12-14 DIAGNOSIS — Z8619 Personal history of other infectious and parasitic diseases: Secondary | ICD-10-CM | POA: Diagnosis not present

## 2023-12-14 DIAGNOSIS — Z8249 Family history of ischemic heart disease and other diseases of the circulatory system: Secondary | ICD-10-CM | POA: Diagnosis not present

## 2023-12-14 DIAGNOSIS — G8929 Other chronic pain: Secondary | ICD-10-CM | POA: Diagnosis present

## 2023-12-14 DIAGNOSIS — K219 Gastro-esophageal reflux disease without esophagitis: Secondary | ICD-10-CM | POA: Diagnosis present

## 2023-12-14 DIAGNOSIS — G4700 Insomnia, unspecified: Secondary | ICD-10-CM | POA: Diagnosis present

## 2023-12-14 DIAGNOSIS — Z8546 Personal history of malignant neoplasm of prostate: Secondary | ICD-10-CM | POA: Diagnosis not present

## 2023-12-14 DIAGNOSIS — I161 Hypertensive emergency: Secondary | ICD-10-CM | POA: Diagnosis present

## 2023-12-14 DIAGNOSIS — Z79899 Other long term (current) drug therapy: Secondary | ICD-10-CM | POA: Diagnosis not present

## 2023-12-14 LAB — BASIC METABOLIC PANEL WITH GFR
Anion gap: 10 (ref 5–15)
BUN: 12 mg/dL (ref 8–23)
CO2: 24 mmol/L (ref 22–32)
Calcium: 8.7 mg/dL — ABNORMAL LOW (ref 8.9–10.3)
Chloride: 107 mmol/L (ref 98–111)
Creatinine, Ser: 0.8 mg/dL (ref 0.61–1.24)
GFR, Estimated: >60 mL/min (ref >60)
Glucose, Bld: 99 mg/dL (ref 70–99)
Potassium: 3.4 mmol/L — ABNORMAL LOW (ref 3.5–5.1)
Sodium: 141 mmol/L (ref 135–145)

## 2023-12-14 LAB — CBC
HCT: 30.7 % — ABNORMAL LOW (ref 39.0–52.0)
Hemoglobin: 7.8 g/dL — ABNORMAL LOW (ref 13.0–17.0)
MCH: 17.6 pg — ABNORMAL LOW (ref 26.0–34.0)
MCHC: 25.4 g/dL — ABNORMAL LOW (ref 30.0–36.0)
MCV: 69.5 fL — ABNORMAL LOW (ref 80.0–100.0)
Platelets: 388 10*3/uL (ref 150–400)
RBC: 4.42 MIL/uL (ref 4.22–5.81)
RDW: 18.3 % — ABNORMAL HIGH (ref 11.5–15.5)
WBC: 6.6 10*3/uL (ref 4.0–10.5)
nRBC: 0 % (ref 0.0–0.2)

## 2023-12-14 MED ORDER — CARVEDILOL 3.125 MG PO TABS
3.1250 mg | ORAL_TABLET | Freq: Two times a day (BID) | ORAL | Status: DC
  Administered 2023-12-14: 3.125 mg via ORAL
  Filled 2023-12-14: qty 1

## 2023-12-14 MED ORDER — POTASSIUM CHLORIDE CRYS ER 20 MEQ PO TBCR
20.0000 meq | EXTENDED_RELEASE_TABLET | Freq: Once | ORAL | Status: AC
  Administered 2023-12-14: 20 meq via ORAL
  Filled 2023-12-14: qty 1

## 2023-12-14 MED ORDER — HYDRALAZINE HCL 50 MG PO TABS
50.0000 mg | ORAL_TABLET | Freq: Three times a day (TID) | ORAL | Status: DC
  Administered 2023-12-14 (×3): 50 mg via ORAL
  Filled 2023-12-14 (×3): qty 1

## 2023-12-14 MED ORDER — HYDRALAZINE HCL 20 MG/ML IJ SOLN
10.0000 mg | Freq: Once | INTRAMUSCULAR | Status: AC
  Administered 2023-12-14: 10 mg via INTRAVENOUS
  Filled 2023-12-14: qty 1

## 2023-12-14 MED ORDER — LABETALOL HCL 5 MG/ML IV SOLN
0.5000 mg/min | Status: DC
  Administered 2023-12-14: 0.5 mg/min via INTRAVENOUS
  Filled 2023-12-14 (×2): qty 80

## 2023-12-14 MED ORDER — SPIRONOLACTONE 25 MG PO TABS
25.0000 mg | ORAL_TABLET | Freq: Every day | ORAL | Status: DC
  Administered 2023-12-14 – 2023-12-15 (×2): 25 mg via ORAL
  Filled 2023-12-14 (×2): qty 1

## 2023-12-14 MED ORDER — QUETIAPINE FUMARATE 100 MG PO TABS
200.0000 mg | ORAL_TABLET | Freq: Every day | ORAL | Status: DC
  Administered 2023-12-14: 200 mg via ORAL
  Filled 2023-12-14: qty 2

## 2023-12-14 MED ORDER — AMLODIPINE BESYLATE 5 MG PO TABS
5.0000 mg | ORAL_TABLET | Freq: Every day | ORAL | Status: DC
  Administered 2023-12-14: 5 mg via ORAL
  Filled 2023-12-14: qty 1

## 2023-12-14 MED ORDER — FUROSEMIDE 20 MG PO TABS
20.0000 mg | ORAL_TABLET | Freq: Two times a day (BID) | ORAL | Status: DC
  Administered 2023-12-14 – 2023-12-15 (×3): 20 mg via ORAL
  Filled 2023-12-14 (×3): qty 1

## 2023-12-14 NOTE — Progress Notes (Signed)
 This nurse contacted night NP for order clarification of IV labetalol  and what the parameters are for turning off drip. If SBP is dropping below 140-160's turn off the drip.

## 2023-12-14 NOTE — Progress Notes (Signed)
 PROGRESS NOTE    [name]  EXH:[id number] DOB: 16-Jul-1951 DOA: 12/13/2023 PCP: Hershell Lose, NP   Brief Narrative:  [name] is a 73 y.o. male with medical history significant for GERD, anxiety, hypertension, prior small bowel obstruction, CVA being admitted to the hospital with complaints of confusion and weakness with worsening anxiety from baseline.  In the ED labs are concerning for UTI, patient admitted for further IV antibiotics given profound symptoms.    Of note patient received IV Ativan  in the ED with profound somnolence -recommend patient to avoid further benzos in the near future.   Assessment & Plan:   Principal Problem:   UTI (urinary tract infection)  Acute encephalopathy, multifactorial -In the setting of presumed UTI, polypharmacy, questionable hypertensive emergency - CT head shows stable low-density subdural fluid collection in the left hemisphere, chronic without acute findings  HTN emergency - With markedly elevated blood pressure and confusion - Blood pressure improving slowly while tempted to reintroduce patient's home medications, continue to increase as appropriate, as needed hydralazine  also added to avoid profound hypotensive episodes - Unclear if patient has been compliant with his home regimen -recommend follow-up with PCP as soon as possible for medication adjustment after discharge  UTI, sepsis ruled out -Prior history of ESBL E. coli January of this year - Continue meropenem x 3 days -last dose 12/15/2023  Hypokalemia, secondary to diuretics -Continue to replete while on Lasix   - Will likely require chronic replacement at discharge   Chronic anemia, likely of chronic disease-appears to be near baseline   Anxiety -continue home dose Seroquel  at bedtime; avoid benzodiazepines given acute worsening mental status changes in the ED   DVT prophylaxis: enoxaparin  (LOVENOX ) injection 40 mg Start: 12/13/23 2200 Code Status:   Code  Status: Full Code Family Communication: None available  Status is: Inpatient  Dispo: The patient is from: Home              Anticipated d/c is to: Home              Anticipated d/c date is: 24 to 48 hours              Patient currently not medically stable for discharge  Consultants:  None  Procedures:  None  Antimicrobials:  Meropenem  Subjective: No acute issues or events overnight, patient requesting "that medication from last night for my nerves" which we discussed was inappropriate given his poor reaction.  Otherwise denies nausea vomit diarrhea constipation headache fevers chills or chest pain  Objective: Vitals:   12/14/23 0304 12/14/23 0309 12/14/23 0336 12/14/23 0400  BP: (!) 176/77   (!) 188/83  Pulse:  71  67  Resp:  (!) 24  18  Temp:   98.5 F (36.9 C)   TempSrc:   Oral   SpO2:  98%  98%  Weight:        Intake/Output Summary (Last 24 hours) at 12/14/2023 0728 Last data filed at 12/14/2023 0711 Gross per 24 hour  Intake 3621.65 ml  Output 1300 ml  Net 2321.65 ml   Filed Weights   12/13/23 1400  Weight: 93.4 kg    Examination:  General:  Pleasantly resting in bed, No acute distress. HEENT:  Normocephalic atraumatic.  Sclerae nonicteric, noninjected.  Extraocular movements intact bilaterally. Neck:  Without mass or deformity.  Trachea is midline. Lungs:  Clear to auscultate bilaterally without rhonchi, wheeze, or rales. Heart:  Regular rate and rhythm.  Without murmurs, rubs, or gallops.  Abdomen:  Soft, nontender, nondistended.  Without guarding or rebound. Extremities: Without cyanosis, clubbing, edema, or obvious deformity. Skin:  Warm and dry, no erythema.  Data Reviewed: I have personally reviewed following labs and imaging studies  CBC: Recent Labs  Lab 12/13/23 1135 12/14/23 0318  WBC 5.2 6.6  NEUTROABS 3.1  --   HGB 8.3* 7.8*  HCT 32.1* 30.7*  MCV 67.4* 69.5*  PLT 392 388   Basic Metabolic Panel: Recent Labs  Lab 12/13/23 1135  12/14/23 0318  NA 139 141  K 3.1* 3.4*  CL 103 107  CO2 24 24  GLUCOSE 120* 99  BUN 14 12  CREATININE 0.85 0.80  CALCIUM  9.2 8.7*   GFR: Estimated Creatinine Clearance: 91.4 mL/min (by C-G formula based on SCr of 0.8 mg/dL). Liver Function Tests: Recent Labs  Lab 12/13/23 1135  AST 16  ALT 9  ALKPHOS 102  BILITOT 0.3  PROT 7.8  ALBUMIN 4.1   Recent Labs  Lab 12/13/23 1749  AMMONIA 21   Anemia Panel: Recent Labs    12/13/23 1749  VITAMINB12 392  FOLATE 6.0    Recent Results (from the past 240 hours)  MRSA Next Gen by PCR, Nasal     Status: None   Collection Time: 12/13/23  7:28 PM   Specimen: Nasal Mucosa; Nasal Swab  Result Value Ref Range Status   MRSA by PCR Next Gen NOT DETECTED NOT DETECTED Final    Comment: (NOTE) The GeneXpert MRSA Assay (FDA approved for NASAL specimens only), is one component of a comprehensive MRSA colonization surveillance program. It is not intended to diagnose MRSA infection nor to guide or monitor treatment for MRSA infections. Test performance is not FDA approved in patients less than 94 years old. Performed at [facility], [address] [place], [place] [address]          Radiology Studies: CT HEAD WO CONTRAST ( ) Result Date: 12/13/2023 CLINICAL DATA:  TIA.  Weakness. EXAM: CT HEAD WITHOUT CONTRAST TECHNIQUE: Contiguous axial images were obtained from the base of the skull through the vertex without intravenous contrast. RADIATION DOSE REDUCTION: This exam was performed according to the departmental dose-optimization program which includes automated exposure control, adjustment of the mA and/or kV according to patient size and/or use of iterative reconstruction technique. COMPARISON:  08/03/2023 FINDINGS: Brain: Low-density subdural fluid collection again noted over the left cerebral convexity. This measures approximately 8 mm in thickness. When measured at the same level on prior study, this is  stable. No acute hemorrhage or infarct. No hydrocephalus. Chronic small vessel disease throughout the deep white matter. Mild atrophy. Vascular: No hyperdense vessel or unexpected calcification. Skull: No acute calvarial abnormality. Sinuses/Orbits: No acute findings Other: None IMPRESSION: Stable low-density subdural fluid collection over the left cerebral hemisphere. No midline shift. Atrophy, chronic microvascular disease. No acute intracranial abnormality. Electronically Signed   By: Janeece Mechanic M.D.   On: 12/13/2023 17:28        Scheduled Meds:  Chlorhexidine  Gluconate Cloth  6 each Topical Daily   enoxaparin  (LOVENOX ) injection  40 mg Subcutaneous Q24H   potassium chloride   20 mEq Oral Once   Continuous Infusions:  sodium chloride  125 mL/hr at 12/14/23 0711   meropenem (MERREM) IV Stopped (12/14/23 0041)     LOS: 0 days   Time spent:  Haydee Lipa, DO [facility]  If 7PM-7AM, please contact night-coverage [email or web address]  12/14/2023, 7:28 AM

## 2023-12-14 NOTE — Progress Notes (Signed)
 Patient yelling nurse. Patient educated on call bell use. Patient said he felt dizzy & nauseous due to anxiety. Therapeutic communication used. Dr. Rudine Cos notified. BP elevated. PRN hydralazine  given. Patient upset. Patient says "I'm not getting the care I need. I might as well has stayed home. I'm going to leave tomorrow if I don't get better." Patient educated and explained that improvement will take a day or so since antibiotics was started last night. Will have to find the right dose of antihypertensive. See [facility]

## 2023-12-14 NOTE — Plan of Care (Signed)
  Problem: Education: Goal: Knowledge of General Education information will improve Description: Including pain rating scale, medication(s)/side effects and non-pharmacologic comfort measures Outcome: Progressing   Problem: Health Behavior/Discharge Planning: Goal: Ability to manage health-related needs will improve Outcome: Progressing   Problem: Clinical Measurements: Goal: Will remain free from infection Outcome: Progressing Goal: Diagnostic test results will improve Outcome: Progressing Goal: Respiratory complications will improve Outcome: Progressing Goal: Cardiovascular complication will be avoided Outcome: Progressing   Problem: Nutrition: Goal: Adequate nutrition will be maintained Outcome: Progressing   Problem: Coping: Goal: Level of anxiety will decrease Outcome: Progressing   Problem: Elimination: Goal: Will not experience complications related to bowel motility Outcome: Progressing Goal: Will not experience complications related to urinary retention Outcome: Progressing   Problem: Pain Managment: Goal: General experience of comfort will improve and/or be controlled Outcome: Progressing   Problem: Safety: Goal: Ability to remain free from injury will improve Outcome: Progressing   Problem: Skin Integrity: Goal: Risk for impaired skin integrity will decrease Outcome: Progressing

## 2023-12-14 NOTE — Plan of Care (Signed)
   Problem: Education: Goal: Knowledge of General Education information will improve Description: Including pain rating scale, medication(s)/side effects and non-pharmacologic comfort measures Outcome: Progressing   Problem: Health Behavior/Discharge Planning: Goal: Ability to manage health-related needs will improve Outcome: Progressing   Problem: Activity: Goal: Risk for activity intolerance will decrease Outcome: Progressing   Problem: Nutrition: Goal: Adequate nutrition will be maintained Outcome: Progressing   Problem: Coping: Goal: Level of anxiety will decrease Outcome: Progressing   Problem: Pain Managment: Goal: General experience of comfort will improve and/or be controlled Outcome: Progressing   Problem: Safety: Goal: Ability to remain free from injury will improve Outcome: Progressing   Problem: Skin Integrity: Goal: Risk for impaired skin integrity will decrease Outcome: Progressing

## 2023-12-14 NOTE — Progress Notes (Signed)
 Patient having some agitation and restlessness when BP is being taken automatically. Unable to get accurate reading to treat hypertension. BP cuff moved to left lower leg per NP at bedside, hold one time dose of hydralazine  and monitor BP. Ok to give one time dose of hydralazine  if BP is above goal at next scheduled check.

## 2023-12-15 DIAGNOSIS — I161 Hypertensive emergency: Secondary | ICD-10-CM | POA: Diagnosis not present

## 2023-12-15 DIAGNOSIS — R451 Restlessness and agitation: Secondary | ICD-10-CM

## 2023-12-15 DIAGNOSIS — G9340 Encephalopathy, unspecified: Secondary | ICD-10-CM | POA: Diagnosis not present

## 2023-12-15 DIAGNOSIS — N3000 Acute cystitis without hematuria: Secondary | ICD-10-CM | POA: Diagnosis not present

## 2023-12-15 LAB — CBC
HCT: 28.4 % — ABNORMAL LOW (ref 39.0–52.0)
Hemoglobin: 7.3 g/dL — ABNORMAL LOW (ref 13.0–17.0)
MCH: 17.6 pg — ABNORMAL LOW (ref 26.0–34.0)
MCHC: 25.7 g/dL — ABNORMAL LOW (ref 30.0–36.0)
MCV: 68.6 fL — ABNORMAL LOW (ref 80.0–100.0)
Platelets: 357 10*3/uL (ref 150–400)
RBC: 4.14 MIL/uL — ABNORMAL LOW (ref 4.22–5.81)
RDW: 18.7 % — ABNORMAL HIGH (ref 11.5–15.5)
WBC: 4.9 10*3/uL (ref 4.0–10.5)
nRBC: 0 % (ref 0.0–0.2)

## 2023-12-15 LAB — BASIC METABOLIC PANEL WITH GFR
Anion gap: 9 (ref 5–15)
BUN: 12 mg/dL (ref 8–23)
CO2: 21 mmol/L — ABNORMAL LOW (ref 22–32)
Calcium: 8.4 mg/dL — ABNORMAL LOW (ref 8.9–10.3)
Chloride: 106 mmol/L (ref 98–111)
Creatinine, Ser: 1.11 mg/dL (ref 0.61–1.24)
GFR, Estimated: >60 mL/min (ref >60)
Glucose, Bld: 93 mg/dL (ref 70–99)
Potassium: 3.3 mmol/L — ABNORMAL LOW (ref 3.5–5.1)
Sodium: 136 mmol/L (ref 135–145)

## 2023-12-15 MED ORDER — HYDRALAZINE HCL 50 MG PO TABS
100.0000 mg | ORAL_TABLET | Freq: Three times a day (TID) | ORAL | Status: DC
  Administered 2023-12-15: 100 mg via ORAL
  Filled 2023-12-15: qty 2

## 2023-12-15 MED ORDER — AMLODIPINE BESYLATE 10 MG PO TABS: 10.0000 mg | ORAL_TABLET | Freq: Every day | ORAL | 0 refills | Status: AC

## 2023-12-15 MED ORDER — HYDRALAZINE HCL 100 MG PO TABS: 100.0000 mg | ORAL_TABLET | Freq: Three times a day (TID) | ORAL | 0 refills | Status: AC

## 2023-12-15 MED ORDER — AMLODIPINE BESYLATE 10 MG PO TABS
10.0000 mg | ORAL_TABLET | Freq: Every day | ORAL | Status: DC
  Administered 2023-12-15: 10 mg via ORAL
  Filled 2023-12-15: qty 1

## 2023-12-15 NOTE — Plan of Care (Signed)
   Problem: Education: Goal: Knowledge of General Education information will improve Description: Including pain rating scale, medication(s)/side effects and non-pharmacologic comfort measures Outcome: Progressing   Problem: Activity: Goal: Risk for activity intolerance will decrease Outcome: Progressing   Problem: Coping: Goal: Level of anxiety will decrease Outcome: Progressing   Problem: Elimination: Goal: Will not experience complications related to bowel motility Outcome: Progressing

## 2023-12-15 NOTE — TOC Transition Note (Signed)
 Transition of Care [facility]) - Discharge Note   Patient Details  Name: [name] MRN: [id number] Date of Birth: Nov 30, 1950  [facility] CM/SW Contact:  Marty Sleet, LCSW Phone Number: 12/15/2023, 1:19 PM   Clinical Narrative:    Pt bedbound and requiring ambulance for transport home. [facility] called at 12:44pm.   Final next level of care: Home/Self Care     Patient Goals and CMS Choice            Discharge Placement                       Discharge Plan and Services Additional resources added to the After Visit Summary for                                       Social Drivers of Health (SDOH) Interventions SDOH Screenings   Food Insecurity: No Food Insecurity (12/14/2023)  Housing: Low Risk  (05/10/2023)  Transportation Needs: No Transportation Needs (12/14/2023)  Utilities: Not At Risk (12/14/2023)  Alcohol Screen: Low Risk  (12/16/2021)  Depression (PHQ2-9): Medium Risk (02/12/2022)  Financial Resource Strain: Low Risk  (12/16/2021)  Physical Activity: Inactive (12/16/2021)  Social Connections: Socially Isolated (12/14/2023)  Stress: No Stress Concern Present (12/16/2021)  Tobacco Use: High Risk (12/13/2023)     Readmission Risk Interventions    12/15/2023    1:18 PM 12/15/2023    8:50 AM  Readmission Risk Prevention Plan  Transportation Screening Complete Complete  PCP or Specialist Appt within 3-5 Days  Complete  HRI or Home Care Consult  Complete  Social Work Consult for Recovery Care Planning/Counseling  Complete  Palliative Care Screening  Not Complete  Palliative Care Screening Not Complete Comments  N/A  Medication Review Oceanographer)  Complete

## 2023-12-15 NOTE — Plan of Care (Signed)
  Problem: Health Behavior/Discharge Planning: Goal: Ability to manage health-related needs will improve Outcome: Progressing   Problem: Clinical Measurements: Goal: Will remain free from infection Outcome: Progressing   Problem: Activity: Goal: Risk for activity intolerance will decrease Outcome: Progressing   Problem: Coping: Goal: Level of anxiety will decrease Outcome: Progressing   Problem: Safety: Goal: Ability to remain free from injury will improve Outcome: Progressing   

## 2023-12-15 NOTE — Plan of Care (Signed)
  Problem: Education: Goal: Knowledge of General Education information will improve Description: Including pain rating scale, medication(s)/side effects and non-pharmacologic comfort measures 12/15/2023 1247 by Helen Loa, RN Outcome: Adequate for Discharge 12/15/2023 1157 by Helen Loa, RN Outcome: Progressing   Problem: Health Behavior/Discharge Planning: Goal: Ability to manage health-related needs will improve 12/15/2023 1247 by Helen Loa, RN Outcome: Adequate for Discharge 12/15/2023 1157 by Helen Loa, RN Outcome: Progressing   Problem: Clinical Measurements: Goal: Ability to maintain clinical measurements within normal limits will improve 12/15/2023 1247 by Helen Loa, RN Outcome: Adequate for Discharge 12/15/2023 1157 by Helen Loa, RN Outcome: Progressing Goal: Will remain free from infection 12/15/2023 1247 by Helen Loa, RN Outcome: Adequate for Discharge 12/15/2023 1157 by Helen Loa, RN Outcome: Progressing Goal: Diagnostic test results will improve 12/15/2023 1247 by Helen Loa, RN Outcome: Adequate for Discharge 12/15/2023 1157 by Helen Loa, RN Outcome: Progressing Goal: Respiratory complications will improve 12/15/2023 1247 by Helen Loa, RN Outcome: Adequate for Discharge 12/15/2023 1157 by Helen Loa, RN Outcome: Progressing Goal: Cardiovascular complication will be avoided 12/15/2023 1247 by Helen Loa, RN Outcome: Adequate for Discharge 12/15/2023 1157 by Helen Loa, RN Outcome: Progressing   Problem: Activity: Goal: Risk for activity intolerance will decrease 12/15/2023 1247 by Helen Loa, RN Outcome: Adequate for Discharge 12/15/2023 1157 by Helen Loa, RN Outcome: Progressing   Problem: Nutrition: Goal: Adequate nutrition will be maintained 12/15/2023 1247 by Helen Loa, RN Outcome: Adequate for Discharge 12/15/2023 1157 by Helen Loa, RN Outcome: Progressing   Problem: Coping: Goal: Level of anxiety will  decrease 12/15/2023 1247 by Helen Loa, RN Outcome: Adequate for Discharge 12/15/2023 1157 by Helen Loa, RN Outcome: Progressing   Problem: Elimination: Goal: Will not experience complications related to bowel motility 12/15/2023 1247 by Helen Loa, RN Outcome: Adequate for Discharge 12/15/2023 1157 by Helen Loa, RN Outcome: Progressing Goal: Will not experience complications related to urinary retention 12/15/2023 1247 by Helen Loa, RN Outcome: Adequate for Discharge 12/15/2023 1157 by Helen Loa, RN Outcome: Progressing   Problem: Pain Managment: Goal: General experience of comfort will improve and/or be controlled 12/15/2023 1247 by Helen Loa, RN Outcome: Adequate for Discharge 12/15/2023 1157 by Helen Loa, RN Outcome: Progressing   Problem: Safety: Goal: Ability to remain free from injury will improve 12/15/2023 1247 by Helen Loa, RN Outcome: Adequate for Discharge 12/15/2023 1157 by Helen Loa, RN Outcome: Progressing   Problem: Skin Integrity: Goal: Risk for impaired skin integrity will decrease 12/15/2023 1247 by Helen Loa, RN Outcome: Adequate for Discharge 12/15/2023 1157 by Helen Loa, RN Outcome: Progressing

## 2023-12-15 NOTE — Discharge Summary (Signed)
 Physician Discharge Summary  [name] ZOX:[id number] DOB: 1951/02/03 DOA: 12/13/2023  PCP: Hershell Lose, NP  Admit date: 12/13/2023 Discharge date: 12/15/2023  Admitted From: Home Disposition: Home  Recommendations for Outpatient Follow-up:  Follow up with PCP in 1-2 weeks  Home Health: None Equipment/Devices: None  Discharge Condition: Stable CODE STATUS: Full Diet recommendation: Low-salt low-fat low-carb diet  Brief/Interim Summary: [name] is a 73 y.o. male with medical history significant for GERD, anxiety, hypertension, prior small bowel obstruction, CVA being admitted to the hospital with complaints of confusion and weakness with worsening anxiety from baseline.  In the ED labs are concerning for UTI, patient admitted for further IV antibiotics given profound symptoms.  Patient admitted as above with encephalopathy secondary to UTI and likely profound hypertension.  Patient's mental status improved drastically over the past 48 hours and has now completed his antibiotic course.  In regards to patient's uncontrolled hypertension it is now markedly improving after labetalol  drip was initiated and discontinued yesterday, he is not yet to goal but given his current regimen includes 5 different antihypertensives with improving blood pressure will have patient follow-up with PCP and/or cardiology in the outpatient setting for further evaluation and medication management.  Patient other chronic comorbid conditions appear to be stable other than his anxiety which he reports is quite uncontrolled.  He unfortunately received IV Ativan  in the ED at admission which caused profound somnolence but the patient continues to request this medication specifically which we discussed was not appropriate for him given his age and comorbidities. I explained while I do not feel that benzodiazepines are appropriate to treat his anxiety he can follow-up outpatient with his primary team and/or  psychiatry to further evaluate for potential medication changes and adjustments..  Discharge Diagnoses:  Principal Problem:   UTI (urinary tract infection) Active Problems:   Agitation   Acute encephalopathy   Hypertensive emergency   Discharge Instructions  Discharge Instructions     Call MD for:   Complete by: As directed    Worsening anxiety   Call MD for:  difficulty breathing, headache or visual disturbances   Complete by: As directed    Call MD for:  extreme fatigue   Complete by: As directed    Call MD for:  persistant nausea and vomiting   Complete by: As directed    Call MD for:  severe uncontrolled pain   Complete by: As directed    Call MD for:  temperature >100.4   Complete by: As directed    Diet - low sodium heart healthy   Complete by: As directed    Increase activity slowly   Complete by: As directed       Allergies as of 12/15/2023       Reactions   Sudafed [pseudoephedrine] Palpitations, Other (See Comments)   Heart "races"        Medication List     TAKE these medications    acetaminophen  325 MG tablet Commonly known as: TYLENOL  Take 2 tablets (650 mg total) by mouth every 6 (six) hours as needed for moderate pain or headache.   amLODipine  10 MG tablet Commonly known as: NORVASC  Take 1 tablet (10 mg total) by mouth daily. Start taking on: Dec 16, 2023   buPROPion  200 MG 12 hr tablet Commonly known as: WELLBUTRIN  SR TAKE 1 TABLET BY MOUTH DAILY   carvedilol  3.125 MG tablet Commonly known as: COREG  Take 1 tablet (3.125 mg total) by mouth 2 (two) times daily  with a meal.   cyanocobalamin  1000 MCG tablet Take 1 tablet (1,000 mcg total) by mouth daily.   ferrous sulfate  325 (65 FE) MG tablet Take 1 tablet (325 mg total) by mouth daily with breakfast.   folic acid  1 MG tablet Commonly known as: FOLVITE  Take 1 tablet (1 mg total) by mouth daily.   furosemide  20 MG tablet Commonly known as: LASIX  Take 1 tablet (20 mg total) by  mouth 2 (two) times daily.   hydrALAZINE  100 MG tablet Commonly known as: APRESOLINE  Take 1 tablet (100 mg total) by mouth 3 (three) times daily. What changed:  medication strength how much to take   ketoconazole 2 % cream Commonly known as: NIZORAL Apply topically.   nicotine  21 mg/24hr patch Commonly known as: NICODERM CQ  - dosed in mg/24 hours Place 1 patch (21 mg total) onto the skin daily.   ondansetron  4 MG tablet Commonly known as: ZOFRAN  Take 4 mg by mouth every 8 (eight) hours as needed.   pantoprazole  40 MG tablet Commonly known as: PROTONIX  TAKE 1 TABLET BY MOUTH TWICE A DAY BEFORE MEALS   QUEtiapine  200 MG tablet Commonly known as: SEROQUEL  Take 1 tablet (200 mg total) by mouth at bedtime.   rosuvastatin 5 MG tablet Commonly known as: CRESTOR SMARTSIG:1 Tablet(s) By Mouth Every Evening   sertraline  100 MG tablet Commonly known as: ZOLOFT  Take 1 tablet (100 mg total) by mouth daily.   spironolactone  25 MG tablet Commonly known as: ALDACTONE  Take 1 tablet (25 mg total) by mouth daily.   Vitamin D (Ergocalciferol) 50000 units Caps Take 1 capsule by mouth once a week.        Allergies  Allergen Reactions   Sudafed [Pseudoephedrine] Palpitations and Other (See Comments)    Heart "races"    Consultations: None   Procedures/Studies: CT HEAD WO CONTRAST ( ) Result Date: 12/13/2023 CLINICAL DATA:  TIA.  Weakness. EXAM: CT HEAD WITHOUT CONTRAST TECHNIQUE: Contiguous axial images were obtained from the base of the skull through the vertex without intravenous contrast. RADIATION DOSE REDUCTION: This exam was performed according to the departmental dose-optimization program which includes automated exposure control, adjustment of the mA and/or kV according to patient size and/or use of iterative reconstruction technique. COMPARISON:  08/03/2023 FINDINGS: Brain: Low-density subdural fluid collection again noted over the left cerebral convexity. This  measures approximately 8 mm in thickness. When measured at the same level on prior study, this is stable. No acute hemorrhage or infarct. No hydrocephalus. Chronic small vessel disease throughout the deep white matter. Mild atrophy. Vascular: No hyperdense vessel or unexpected calcification. Skull: No acute calvarial abnormality. Sinuses/Orbits: No acute findings Other: None IMPRESSION: Stable low-density subdural fluid collection over the left cerebral hemisphere. No midline shift. Atrophy, chronic microvascular disease. No acute intracranial abnormality. Electronically Signed   By: Janeece Mechanic M.D.   On: 12/13/2023 17:28     Subjective: No acute issues or events overnight denies nausea vomiting diarrhea constipation effusions or chest pain   Discharge Exam: Vitals:   12/15/23 0800 12/15/23 1152  BP: (!) 158/77 (!) 145/62  Pulse: 61 87  Resp: 14 18  Temp: 98.1 F (36.7 C) 98.3 F (36.8 C)  SpO2: 97% 99%   Vitals:   12/15/23 0500 12/15/23 0600 12/15/23 0800 12/15/23 1152  BP: (!) 119/56 (!) 114/54 (!) 158/77 (!) 145/62  Pulse:   61 87  Resp:   14 18  Temp:   98.1 F (36.7 C) 98.3 F (36.8 C)  TempSrc:   Oral Oral  SpO2:   97% 99%  Weight:        General: Pt is alert, awake, not in acute distress Cardiovascular: RRR, S1/S2 +, no rubs, no gallops Respiratory: CTA bilaterally, no wheezing, no rhonchi Abdominal: Soft, NT, ND, bowel sounds + Extremities: no edema, no cyanosis    The results of significant diagnostics from this hospitalization (including imaging, microbiology, ancillary and laboratory) are listed below for reference.     Microbiology: Recent Results (from the past 240 hours)  Urine Culture     Status: Abnormal (Preliminary result)   Collection Time: 12/13/23 11:47 AM   Specimen: Urine, Random  Result Value Ref Range Status   Specimen Description   Final    URINE, RANDOM Performed at [facility], [address], [place], [place]  [address]    Special Requests   Final    NONE Reflexed from [id number] Performed at [facility], [address], [place], [place] [address]    Culture (A)  Final    >=100,000 COLONIES/mL GRAM NEGATIVE RODS 90,000 COLONIES/mL GRAM NEGATIVE RODS SUSCEPTIBILITIES TO FOLLOW Performed at [facility], [address], [place], [place] [address]    Report Status PENDING  Incomplete  MRSA Next Gen by PCR, Nasal     Status: None   Collection Time: 12/13/23  7:28 PM   Specimen: Nasal Mucosa; Nasal Swab  Result Value Ref Range Status   MRSA by PCR Next Gen NOT DETECTED NOT DETECTED Final    Comment: (NOTE) The GeneXpert MRSA Assay (FDA approved for NASAL specimens only), is one component of a comprehensive MRSA colonization surveillance program. It is not intended to diagnose MRSA infection nor to guide or monitor treatment for MRSA infections. Test performance is not FDA approved in patients less than 47 years old. Performed at [facility], [address] [place], [place] [address]      Labs: BNP (last 3 results) Recent Labs    05/14/23 0524 05/15/23 0242 10/16/23 1421  BNP 94.5 67.3 89.4   Basic Metabolic Panel: Recent Labs  Lab 12/13/23 1135 12/14/23 0318 12/15/23 0326  NA 139 141 136  K 3.1* 3.4* 3.3*  CL 103 107 106  CO2 24 24 21*  GLUCOSE 120* 99 93  BUN 14 12 12   CREATININE 0.85 0.80 1.11  CALCIUM  9.2 8.7* 8.4*   Liver Function Tests: Recent Labs  Lab 12/13/23 1135  AST 16  ALT 9  ALKPHOS 102  BILITOT 0.3  PROT 7.8  ALBUMIN 4.1   No results for input(s): "LIPASE", "AMYLASE" in the last 168 hours. Recent Labs  Lab 12/13/23 1749  AMMONIA 21   CBC: Recent Labs  Lab 12/13/23 1135 12/14/23 0318 12/15/23 0326  WBC 5.2 6.6 4.9  NEUTROABS 3.1  --   --   HGB 8.3* 7.8* 7.3*  HCT 32.1* 30.7* 28.4*  MCV 67.4* 69.5* 68.6*  PLT 392 388 357   Cardiac Enzymes: No results for input(s): "CKTOTAL", "CKMB",  "CKMBINDEX", "TROPONINI" in the last 168 hours. BNP: Invalid input(s): "POCBNP" CBG: No results for input(s): "GLUCAP" in the last 168 hours. D-Dimer No results for input(s): "DDIMER" in the last 72 hours. Hgb A1c No results for input(s): "HGBA1C" in the last 72 hours. Lipid Profile No results for input(s): "CHOL", "HDL", "LDLCALC", "TRIG", "CHOLHDL", "LDLDIRECT" in the last 72 hours. Thyroid  function studies No results for input(s): "TSH", "T4TOTAL", "T3FREE", "THYROIDAB" in the last 72 hours.  Invalid input(s): "FREET3" Anemia work up Recent Labs    12/13/23 1749  VITAMINB12 392  FOLATE 6.0   Urinalysis    Component Value Date/Time   COLORURINE YELLOW 12/13/2023 1147   APPEARANCEUR HAZY (A) 12/13/2023 1147   LABSPEC 1.014 12/13/2023 1147   PHURINE 8.0 12/13/2023 1147   GLUCOSEU NEGATIVE 12/13/2023 1147   HGBUR NEGATIVE 12/13/2023 1147   BILIRUBINUR NEGATIVE 12/13/2023 1147   BILIRUBINUR n 02/13/2016 1044   KETONESUR NEGATIVE 12/13/2023 1147   PROTEINUR 30 (A) 12/13/2023 1147   UROBILINOGEN 1.0 02/13/2016 1044   UROBILINOGEN 1.0 01/27/2012 0005   NITRITE POSITIVE (A) 12/13/2023 1147   LEUKOCYTESUR MODERATE (A) 12/13/2023 1147   Sepsis Labs Recent Labs  Lab 12/13/23 1135 12/14/23 0318 12/15/23 0326  WBC 5.2 6.6 4.9   Microbiology Recent Results (from the past 240 hours)  Urine Culture     Status: Abnormal (Preliminary result)   Collection Time: 12/13/23 11:47 AM   Specimen: Urine, Random  Result Value Ref Range Status   Specimen Description   Final    URINE, RANDOM Performed at [facility], [address] [place], [place] [address]    Special Requests   Final    NONE Reflexed from [id number] Performed at [facility], [address] [place], [place] [address]    Culture (A)  Final    >=100,000 COLONIES/mL GRAM NEGATIVE RODS 90,000 COLONIES/mL GRAM NEGATIVE RODS SUSCEPTIBILITIES TO FOLLOW Performed at [facility], [address], [place], [place] [address]    Report Status PENDING  Incomplete  MRSA Next Gen by PCR, Nasal     Status: None   Collection Time: 12/13/23  7:28 PM   Specimen: Nasal Mucosa; Nasal Swab  Result Value Ref Range Status   MRSA by PCR Next Gen NOT DETECTED NOT DETECTED Final    Comment: (NOTE) The GeneXpert MRSA Assay (FDA approved for NASAL specimens only), is one component of a comprehensive MRSA colonization surveillance program. It is not intended to diagnose MRSA infection nor to guide or monitor treatment for MRSA infections. Test performance is not FDA approved in patients less than 60 years old. Performed at [facility], [address] [place], [place] [address]      Time coordinating discharge: Over 30 minutes  SIGNED:   Haydee Lipa, DO [facility] 12/15/2023, 12:18 PM Pager   If 7PM-7AM, please contact night-coverage [email or web address]

## 2023-12-15 NOTE — Progress Notes (Signed)
 Patient stable at time of transfer. Clothing, phone charger, hat, & wallet given back to patient. Herman Longs charge RN spoke to patient about lost phone & patient experience will be in touch with patient regarding the lost phone.

## 2023-12-15 NOTE — Progress Notes (Signed)
   12/15/23 0851  TOC Brief Assessment  Insurance and Status Reviewed  Patient has primary care physician Yes  Home environment has been reviewed apartment  Prior level of function: independent  Prior/Current Home Services No current home services  Social Drivers of Health Review SDOH reviewed no interventions necessary  Readmission risk has been reviewed Yes  Transition of care needs transition of care needs identified, TOC will continue to follow    [place], MSW, LCSW 12/15/2023 8:52 AM

## 2023-12-16 LAB — URINE CULTURE: Culture: >=100000 — AB

## 2023-12-22 ENCOUNTER — Other Ambulatory Visit: Payer: Self-pay

## 2023-12-22 ENCOUNTER — Encounter (HOSPITAL_COMMUNITY): Payer: Self-pay

## 2023-12-22 ENCOUNTER — Emergency Department (HOSPITAL_COMMUNITY)
Admission: EM | Admit: 2023-12-22 | Discharge: 2023-12-23 | Disposition: A | Discharge status: Home / Self Care | Attending: Emergency Medicine | Admitting: Emergency Medicine

## 2023-12-22 DIAGNOSIS — E876 Hypokalemia: Secondary | ICD-10-CM | POA: Insufficient documentation

## 2023-12-22 DIAGNOSIS — J181 Lobar pneumonia, unspecified organism: Secondary | ICD-10-CM | POA: Diagnosis not present

## 2023-12-22 DIAGNOSIS — R531 Weakness: Secondary | ICD-10-CM | POA: Diagnosis present

## 2023-12-22 DIAGNOSIS — J189 Pneumonia, unspecified organism: Secondary | ICD-10-CM

## 2023-12-22 NOTE — ED Triage Notes (Addendum)
 Pt BIB [facility] from home for 3 falls in the last 3 days. Pt states that he "just feels weak". No thinners. Did not hit head.  Pt states that he has a pressure sore on his bottom and wants to make sure it isnt infected.  132/p 100HR

## 2023-12-22 NOTE — ED Provider Triage Note (Signed)
 Emergency Medicine Provider Triage Evaluation Note  [name] , a 73 y.o. male  was evaluated in triage.  Pt complains of weakness.  Review of Systems  Positive: weakness Negative: Chest pain  Physical Exam  BP 115/84 (BP Location: Right Arm)   Pulse 89   Temp 98.3 F (36.8 C) (Oral)   Resp 16   Ht 1.753 m (5\' 9" )   Wt 93.4 kg   SpO2 100%   BMI 30.41 kg/m  Gen:   Awake, no distress   Resp:  Normal effort  MSK:   Moves extremities without difficulty  Other:    Medical Decision Making  Medically screening exam initiated at 11:47 PM.  Appropriate orders placed.  [name] was informed that the remainder of the evaluation will be completed by another provider, this initial triage assessment does not replace that evaluation, and the importance of remaining in the ED until their evaluation is complete.     Eldon Greenland, MD 12/22/23 [phone]

## 2023-12-23 ENCOUNTER — Emergency Department (HOSPITAL_COMMUNITY)

## 2023-12-23 DIAGNOSIS — J181 Lobar pneumonia, unspecified organism: Secondary | ICD-10-CM | POA: Diagnosis not present

## 2023-12-23 LAB — CBC
HCT: 31.8 % — ABNORMAL LOW (ref 39.0–52.0)
Hemoglobin: 8.3 g/dL — ABNORMAL LOW (ref 13.0–17.0)
MCH: 17.3 pg — ABNORMAL LOW (ref 26.0–34.0)
MCHC: 26.1 g/dL — ABNORMAL LOW (ref 30.0–36.0)
MCV: 66.4 fL — ABNORMAL LOW (ref 80.0–100.0)
Platelets: 395 10*3/uL (ref 150–400)
RBC: 4.79 MIL/uL (ref 4.22–5.81)
RDW: 18.7 % — ABNORMAL HIGH (ref 11.5–15.5)
WBC: 5.2 10*3/uL (ref 4.0–10.5)
nRBC: 0 % (ref 0.0–0.2)

## 2023-12-23 LAB — COMPREHENSIVE METABOLIC PANEL WITH GFR
ALT: 13 U/L (ref 0–44)
AST: 15 U/L (ref 15–41)
Albumin: 4 g/dL (ref 3.5–5.0)
Alkaline Phosphatase: 87 U/L (ref 38–126)
Anion gap: 9 (ref 5–15)
BUN: 12 mg/dL (ref 8–23)
CO2: 22 mmol/L (ref 22–32)
Calcium: 9.2 mg/dL (ref 8.9–10.3)
Chloride: 109 mmol/L (ref 98–111)
Creatinine, Ser: 0.81 mg/dL (ref 0.61–1.24)
GFR, Estimated: >60 mL/min (ref >60)
Glucose, Bld: 98 mg/dL (ref 70–99)
Potassium: 3.4 mmol/L — ABNORMAL LOW (ref 3.5–5.1)
Sodium: 140 mmol/L (ref 135–145)
Total Bilirubin: 0.5 mg/dL (ref 0.0–1.2)
Total Protein: 7 g/dL (ref 6.5–8.1)

## 2023-12-23 LAB — URINALYSIS, ROUTINE W REFLEX MICROSCOPIC
Bacteria, UA: NONE SEEN
Bilirubin Urine: NEGATIVE
Glucose, UA: NEGATIVE mg/dL
Hgb urine dipstick: NEGATIVE
Ketones, ur: NEGATIVE mg/dL
Nitrite: NEGATIVE
Protein, ur: NEGATIVE mg/dL
Specific Gravity, Urine: 1.006 (ref 1.005–1.030)
pH: 7 (ref 5.0–8.0)

## 2023-12-23 MED ORDER — AMOXICILLIN-POT CLAVULANATE 875-125 MG PO TABS: 1.0000 | ORAL_TABLET | Freq: Two times a day (BID) | ORAL | 0 refills | Status: DC

## 2023-12-23 MED ORDER — SODIUM CHLORIDE 0.9 % IV SOLN
500.0000 mg | Freq: Once | INTRAVENOUS | Status: AC
  Administered 2023-12-23: 500 mg via INTRAVENOUS
  Filled 2023-12-23: qty 5

## 2023-12-23 MED ORDER — DOXYCYCLINE HYCLATE 100 MG PO CAPS
100.0000 mg | ORAL_CAPSULE | Freq: Two times a day (BID) | ORAL | 0 refills | Status: AC
Start: 2023-12-23 — End: ?

## 2023-12-23 MED ORDER — DOXYCYCLINE HYCLATE 100 MG PO CAPS: 100.0000 mg | ORAL_CAPSULE | Freq: Two times a day (BID) | ORAL | 0 refills | Status: DC

## 2023-12-23 MED ORDER — AMOXICILLIN-POT CLAVULANATE 875-125 MG PO TABS: 1.0000 | ORAL_TABLET | Freq: Two times a day (BID) | ORAL | 0 refills | Status: AC

## 2023-12-23 MED ORDER — SODIUM CHLORIDE 0.9 % IV SOLN
1.0000 g | Freq: Once | INTRAVENOUS | Status: AC
  Administered 2023-12-23: 1 g via INTRAVENOUS
  Filled 2023-12-23: qty 10

## 2023-12-23 NOTE — ED Provider Notes (Signed)
 [facility] Emergency Department Provider Note MRN:  [id number]  Arrival date & time: 12/23/23     Chief Complaint   Fall and Weakness   History of Present Illness   [name] is a 73 y.o. year-old male presents to the ED with chief complaint of fall.  He states that he fell out of his wheel chair.  He reports that he has felt generally weak for the past few days.  Had a recent admission for UTI.  He denies fevers or chills.  Denies any dysuria.  Denies having any pain.  States that he feels ready to go home.    History provided by patient.   Review of Systems  Pertinent positive and negative review of systems noted in HPI.    Physical Exam   Vitals:   12/23/23 0200 12/23/23 0215  BP: (!) 156/73 (!) 156/75  Pulse: 66 65  Resp: (!) 24 (!) 23  Temp:    SpO2: 100% 99%    CONSTITUTIONAL:  chronically ill-appearing, NAD NEURO:  Alert and oriented x 3, CN 3-12 grossly intact EYES:  eyes equal and reactive ENT/NECK:  Supple, no stridor  CARDIO:  normal rate, regular rhythm, appears well-perfused  PULM:  No respiratory distress, CTAB GI/GU:  non-distended,  MSK/SPINE:  No gross deformities, no edema, moves all extremities  SKIN:  no rash, atraumatic   *Additional and/or pertinent findings included in MDM below  Diagnostic and Interventional Summary    EKG Interpretation Date/Time:  Friday December 23 2023 00:51:02 EDT Ventricular Rate:  76 PR Interval:  197 QRS Duration:  97 QT Interval:  415 QTC Calculation: 467 R Axis:   -11  Text Interpretation: Sinus rhythm Atrial premature complexes Abnormal R-wave progression, early transition Left ventricular hypertrophy Interpretation limited secondary to artifact Confirmed by Eldon Greenland ([id number]) on 12/23/2023 12:58:59 AM       Labs Reviewed  COMPREHENSIVE METABOLIC PANEL WITH GFR - Abnormal; Notable for the following components:      Result Value   Potassium 3.4 (*)    All other  components within normal limits  CBC - Abnormal; Notable for the following components:   Hemoglobin 8.3 (*)    HCT 31.8 (*)    MCV 66.4 (*)    MCH 17.3 (*)    MCHC 26.1 (*)    RDW 18.7 (*)    All other components within normal limits  URINALYSIS, ROUTINE W REFLEX MICROSCOPIC - Abnormal; Notable for the following components:   Color, Urine STRAW (*)    Leukocytes,Ua TRACE (*)    All other components within normal limits  CBG MONITORING, ED    DG Chest Portable 1 View  Final Result    CT Head Wo Contrast    (Results Pending)    Medications  azithromycin (ZITHROMAX) 500 mg in sodium chloride  0.9 % 250 mL IVPB (has no administration in time range)  cefTRIAXone  (ROCEPHIN ) 1 g in sodium chloride  0.9 % 100 mL IVPB (has no administration in time range)     Procedures  /  Critical Care Procedures  ED Course and Medical Decision Making  I have reviewed the triage vital signs, the nursing notes, and pertinent available records from the EMR.  Social Determinants Affecting Complexity of Care: Patient has no clinically significant social determinants affecting this chief complaint..   ED Course:    Medical Decision Making Patient here complaining of generalized weakness.  He states that he feels better now and is ready to  go home.  I informed him that we just needed to wait for a couple of tests.    Labs are fairly reassuring.  Has baseline anemia.  No leukocytosis.  CT head negative for acute intracranial process.  CXR notable for probable pneumonia.  Will treat with azithro and rocephin  her and DC home with doxy and augmentin.   Patient seen by and discussed with Dr. Wallis Gun, who agrees with plan.  Amount and/or Complexity of Data Reviewed Labs: ordered. Radiology: ordered.         Consultants: No consultations were needed in caring for this patient.   Treatment and Plan: I considered admission due to patient's initial presentation, but after considering the  examination and diagnostic results, patient will not require admission and can be discharged with outpatient follow-up.  Patient seen by and discussed with attending physician, Dr. Wallis Gun, who agrees with outpatient treatment for pneumonia.  Final Clinical Impressions(s) / ED Diagnoses     ICD-10-CM   1. Community acquired pneumonia of left lung, unspecified part of lung  J18.9       ED Discharge Orders     None         Discharge Instructions Discussed with and Provided to Patient:   Discharge Instructions   None      Sherel Dikes, PA-C 12/23/23 1610    Eldon Greenland, MD 12/23/23 [phone]

## 2023-12-23 NOTE — ED Notes (Signed)
 Pt soiled brief, sheets and gown, called out for assistance and this NT helped clean him
